# Patient Record
Sex: Female | Born: 1955 | Race: White | Hispanic: No | Marital: Married | Smoking: Current every day smoker
Health system: Southern US, Community
[De-identification: ages and names within clinical notes are randomized; demographics above are authoritative.]

## PROBLEM LIST (undated history)

## (undated) DIAGNOSIS — K3184 Gastroparesis: Secondary | ICD-10-CM

## (undated) DIAGNOSIS — I10 Essential (primary) hypertension: Secondary | ICD-10-CM

## (undated) DIAGNOSIS — M1990 Unspecified osteoarthritis, unspecified site: Secondary | ICD-10-CM

## (undated) DIAGNOSIS — K219 Gastro-esophageal reflux disease without esophagitis: Secondary | ICD-10-CM

## (undated) DIAGNOSIS — K589 Irritable bowel syndrome without diarrhea: Secondary | ICD-10-CM

## (undated) DIAGNOSIS — E119 Type 2 diabetes mellitus without complications: Secondary | ICD-10-CM

## (undated) DIAGNOSIS — F419 Anxiety disorder, unspecified: Secondary | ICD-10-CM

## (undated) HISTORY — DX: Gastro-esophageal reflux disease without esophagitis: K21.9

## (undated) HISTORY — PX: ABDOMINAL HYSTERECTOMY: SHX81

## (undated) HISTORY — PX: CHOLECYSTECTOMY: SHX55

## (undated) HISTORY — PX: UPPER GASTROINTESTINAL ENDOSCOPY: SHX188

## (undated) HISTORY — PX: COLONOSCOPY: SHX174

---

## 2004-07-14 ENCOUNTER — Ambulatory Visit: Payer: Self-pay | Admitting: Internal Medicine

## 2004-07-30 ENCOUNTER — Ambulatory Visit (HOSPITAL_COMMUNITY): Admission: RE | Admit: 2004-07-30 | Discharge: 2004-07-30 | Payer: Self-pay | Admitting: Internal Medicine

## 2004-07-30 ENCOUNTER — Ambulatory Visit: Payer: Self-pay | Admitting: Internal Medicine

## 2004-07-31 ENCOUNTER — Ambulatory Visit (HOSPITAL_COMMUNITY): Admission: RE | Admit: 2004-07-31 | Discharge: 2004-07-31 | Payer: Self-pay | Admitting: Internal Medicine

## 2004-09-09 ENCOUNTER — Ambulatory Visit: Payer: Self-pay | Admitting: Internal Medicine

## 2004-11-03 ENCOUNTER — Ambulatory Visit: Payer: Self-pay | Admitting: Internal Medicine

## 2004-11-05 ENCOUNTER — Encounter (HOSPITAL_COMMUNITY): Admission: RE | Admit: 2004-11-05 | Discharge: 2004-12-05 | Payer: Self-pay | Admitting: Internal Medicine

## 2004-11-12 ENCOUNTER — Ambulatory Visit (HOSPITAL_COMMUNITY): Admission: RE | Admit: 2004-11-12 | Discharge: 2004-11-12 | Payer: Self-pay | Admitting: General Surgery

## 2005-07-20 ENCOUNTER — Ambulatory Visit (HOSPITAL_COMMUNITY): Admission: RE | Admit: 2005-07-20 | Discharge: 2005-07-20 | Payer: Self-pay | Admitting: General Surgery

## 2005-08-13 ENCOUNTER — Ambulatory Visit: Payer: Self-pay | Admitting: Gastroenterology

## 2005-08-19 ENCOUNTER — Ambulatory Visit: Payer: Self-pay | Admitting: Gastroenterology

## 2005-08-19 ENCOUNTER — Ambulatory Visit (HOSPITAL_COMMUNITY): Admission: RE | Admit: 2005-08-19 | Discharge: 2005-08-19 | Payer: Self-pay | Admitting: Gastroenterology

## 2005-08-24 ENCOUNTER — Encounter (HOSPITAL_COMMUNITY): Admission: RE | Admit: 2005-08-24 | Discharge: 2005-09-23 | Payer: Self-pay | Admitting: Gastroenterology

## 2005-09-13 ENCOUNTER — Emergency Department (HOSPITAL_COMMUNITY): Admission: EM | Admit: 2005-09-13 | Discharge: 2005-09-13 | Payer: Self-pay | Admitting: Emergency Medicine

## 2005-09-14 ENCOUNTER — Ambulatory Visit: Payer: Self-pay | Admitting: Gastroenterology

## 2005-10-15 ENCOUNTER — Ambulatory Visit: Payer: Self-pay | Admitting: Internal Medicine

## 2006-04-28 ENCOUNTER — Ambulatory Visit: Payer: Self-pay | Admitting: Internal Medicine

## 2006-11-30 ENCOUNTER — Ambulatory Visit: Payer: Self-pay | Admitting: Cardiology

## 2010-02-23 ENCOUNTER — Encounter: Payer: Self-pay | Admitting: Unknown Physician Specialty

## 2011-10-27 ENCOUNTER — Emergency Department (HOSPITAL_COMMUNITY)
Admission: EM | Admit: 2011-10-27 | Discharge: 2011-10-27 | Disposition: A | Discharge status: Home / Self Care | Payer: BC Managed Care – PPO | Attending: Emergency Medicine | Admitting: Emergency Medicine

## 2011-10-27 ENCOUNTER — Emergency Department (HOSPITAL_COMMUNITY): Payer: BC Managed Care – PPO

## 2011-10-27 ENCOUNTER — Encounter (HOSPITAL_COMMUNITY): Payer: Self-pay

## 2011-10-27 DIAGNOSIS — Z90710 Acquired absence of both cervix and uterus: Secondary | ICD-10-CM | POA: Insufficient documentation

## 2011-10-27 DIAGNOSIS — K589 Irritable bowel syndrome without diarrhea: Secondary | ICD-10-CM

## 2011-10-27 DIAGNOSIS — R109 Unspecified abdominal pain: Secondary | ICD-10-CM

## 2011-10-27 DIAGNOSIS — Z9089 Acquired absence of other organs: Secondary | ICD-10-CM | POA: Insufficient documentation

## 2011-10-27 DIAGNOSIS — E876 Hypokalemia: Secondary | ICD-10-CM

## 2011-10-27 DIAGNOSIS — K219 Gastro-esophageal reflux disease without esophagitis: Secondary | ICD-10-CM

## 2011-10-27 HISTORY — DX: Irritable bowel syndrome, unspecified: K58.9

## 2011-10-27 HISTORY — DX: Anxiety disorder, unspecified: F41.9

## 2011-10-27 HISTORY — DX: Unspecified osteoarthritis, unspecified site: M19.90

## 2011-10-27 LAB — URINALYSIS, ROUTINE W REFLEX MICROSCOPIC
Bilirubin Urine: NEGATIVE
Glucose, UA: NEGATIVE mg/dL
Hgb urine dipstick: NEGATIVE
Ketones, ur: NEGATIVE mg/dL
Leukocytes, UA: NEGATIVE
Nitrite: NEGATIVE
Protein, ur: NEGATIVE mg/dL
Specific Gravity, Urine: 1.015 (ref 1.005–1.030)
Urobilinogen, UA: 0.2 mg/dL (ref 0.0–1.0)
pH: 5.5 (ref 5.0–8.0)

## 2011-10-27 LAB — COMPREHENSIVE METABOLIC PANEL
ALT: 22 U/L (ref 0–35)
AST: 17 U/L (ref 0–37)
Albumin: 3.9 g/dL (ref 3.5–5.2)
Alkaline Phosphatase: 97 U/L (ref 39–117)
BUN: 8 mg/dL (ref 6–23)
CO2: 25 mEq/L (ref 19–32)
Calcium: 10.4 mg/dL (ref 8.4–10.5)
Chloride: 101 mEq/L (ref 96–112)
Creatinine, Ser: 0.66 mg/dL (ref 0.50–1.10)
GFR calc Af Amer: >90 mL/min (ref >90)
GFR calc non Af Amer: >90 mL/min (ref >90)
Glucose, Bld: 99 mg/dL (ref 70–99)
Potassium: 3.3 mEq/L — ABNORMAL LOW (ref 3.5–5.1)
Sodium: 138 mEq/L (ref 135–145)
Total Bilirubin: 0.4 mg/dL (ref 0.3–1.2)
Total Protein: 7.4 g/dL (ref 6.0–8.3)

## 2011-10-27 LAB — CBC WITH DIFFERENTIAL/PLATELET
Basophils Absolute: 0 10*3/uL (ref 0.0–0.1)
Basophils Relative: 0 % (ref 0–1)
Eosinophils Absolute: 0.3 10*3/uL (ref 0.0–0.7)
Eosinophils Relative: 2 % (ref 0–5)
HCT: 40.9 % (ref 36.0–46.0)
Hemoglobin: 14.5 g/dL (ref 12.0–15.0)
Lymphocytes Relative: 37 % (ref 12–46)
Lymphs Abs: 4 10*3/uL (ref 0.7–4.0)
MCH: 33.4 pg (ref 26.0–34.0)
MCHC: 35.5 g/dL (ref 30.0–36.0)
MCV: 94.2 fL (ref 78.0–100.0)
Monocytes Absolute: 0.9 10*3/uL (ref 0.1–1.0)
Monocytes Relative: 8 % (ref 3–12)
Neutro Abs: 5.7 10*3/uL (ref 1.7–7.7)
Neutrophils Relative %: 53 % (ref 43–77)
Platelets: 250 10*3/uL (ref 150–400)
RBC: 4.34 MIL/uL (ref 3.87–5.11)
RDW: 12.4 % (ref 11.5–15.5)
WBC: 10.9 10*3/uL — ABNORMAL HIGH (ref 4.0–10.5)

## 2011-10-27 LAB — LIPASE, BLOOD: Lipase: 44 U/L (ref 11–59)

## 2011-10-27 MED ORDER — IOHEXOL 300 MG/ML  SOLN
100.0000 mL | Freq: Once | INTRAMUSCULAR | Status: AC | PRN
  Administered 2011-10-27: 100 mL via INTRAVENOUS

## 2011-10-27 MED ORDER — POTASSIUM CHLORIDE CRYS ER 20 MEQ PO TBCR
40.0000 meq | EXTENDED_RELEASE_TABLET | Freq: Once | ORAL | Status: AC
  Administered 2011-10-27: 40 meq via ORAL
  Filled 2011-10-27: qty 2

## 2011-10-27 MED ORDER — DICYCLOMINE HCL 20 MG PO TABS: 20.0000 mg | ORAL_TABLET | Freq: Four times a day (QID) | ORAL | Status: DC | PRN

## 2011-10-27 MED ORDER — POTASSIUM CHLORIDE CRYS ER 20 MEQ PO TBCR: 20.0000 meq | EXTENDED_RELEASE_TABLET | Freq: Two times a day (BID) | ORAL | Status: DC

## 2011-10-27 MED ORDER — ONDANSETRON HCL 4 MG/2ML IJ SOLN
4.0000 mg | Freq: Once | INTRAMUSCULAR | Status: AC
  Administered 2011-10-27: 4 mg via INTRAVENOUS
  Filled 2011-10-27: qty 2

## 2011-10-27 MED ORDER — ONDANSETRON HCL 4 MG/2ML IJ SOLN
4.0000 mg | Freq: Once | INTRAMUSCULAR | Status: AC
  Administered 2011-10-27: 4 mg via INTRAVENOUS

## 2011-10-27 MED ORDER — SODIUM CHLORIDE 0.9 % IV SOLN
1000.0000 mL | Freq: Once | INTRAVENOUS | Status: AC
  Administered 2011-10-27: 1000 mL via INTRAVENOUS

## 2011-10-27 MED ORDER — SODIUM CHLORIDE 0.9 % IV SOLN
1000.0000 mL | INTRAVENOUS | Status: DC
Start: 2011-10-27 — End: 2011-10-28
  Administered 2011-10-27: 1000 mL via INTRAVENOUS

## 2011-10-27 MED ORDER — DICYCLOMINE HCL 10 MG PO CAPS
10.0000 mg | ORAL_CAPSULE | Freq: Once | ORAL | Status: AC
  Administered 2011-10-27: 10 mg via ORAL
  Filled 2011-10-27: qty 1

## 2011-10-27 MED ORDER — HYDROCODONE-ACETAMINOPHEN 5-325 MG PO TABS: 2.0000 | ORAL_TABLET | Freq: Four times a day (QID) | ORAL | Status: DC | PRN

## 2011-10-27 MED ORDER — ONDANSETRON HCL 4 MG/2ML IJ SOLN
INTRAMUSCULAR | Status: AC
  Filled 2011-10-27: qty 2

## 2011-10-27 MED ORDER — MORPHINE SULFATE 4 MG/ML IJ SOLN
4.0000 mg | Freq: Once | INTRAMUSCULAR | Status: AC
  Administered 2011-10-27: 4 mg via INTRAVENOUS
  Filled 2011-10-27: qty 1

## 2011-10-27 NOTE — ED Notes (Signed)
Oral contrast complete, CT made aware.

## 2011-10-27 NOTE — ED Notes (Signed)
Pt c/o LUQ pain x 3 weeks.  Reports has been nauseated for 6 months or more.  Pt says symptoms have gotten worse in the past 3 weeks.  Reports has IBS, lbm was today.

## 2011-10-27 NOTE — ED Provider Notes (Signed)
History     CSN: [id number]  Arrival date & time 10/27/11  2952   First MD Initiated Contact with Patient 10/27/11 1843      Chief Complaint  Patient presents with  . Abdominal Pain    (Consider location/radiation/quality/duration/timing/severity/associated sxs/prior treatment) HPI  Patient reports she has had nausea for the past 3 weeks but got worse the past week. She states she was waking up about 3 AM in the morning with nausea however for the past week she has had nausea all day long. She states she feels weak and states she's had a normal appetite. She states she's also having left mid abdominal pain that is intermittent and can last several hours. She states today it has lasted about 5 hours. She states the pain is cramping and burning. She states nothing she does makes it feel worse and nothing she does makes it feel better. She denies fever but states she has felt clammy and had some chills. She denies dysuria but does have frequency. She drinks about 3 cans of Sprite a day. She states she typically has loose stools because of her feel so bowel syndrome, she denies any constipation.  PCP Dr Sherril Croon in [facility] GI Dr Karilyn Cota  Past Medical History  Diagnosis Date  . IBS (irritable bowel syndrome)   . Arthritis   . Anxiety     Past Surgical History  Procedure Date  . Abdominal hysterectomy   . Colonoscopy   . Cholecystectomy   . Upper gastrointestinal endoscopy   . Cesarean section     No family history on file.  History  Substance Use Topics  . Smoking status: Current Every Day Smoker 1 ppd  . Smokeless tobacco: Not on file  . Alcohol Use: No  unemployed  OB History    Grav Para Term Preterm Abortions TAB SAB Ect Mult Living                  Review of Systems  All other systems reviewed and are negative.    Allergies  Penicillins  Home Medications   Current Outpatient Rx  Name Route Sig Dispense Refill  . VITAMIN B COMPLEX PO Oral Take 1 tablet by mouth  daily.    Marland Kitchen DIAZEPAM 5 MG PO TABS Oral Take 5 mg by mouth 2 (two) times daily.    Marland Kitchen DICYCLOMINE HCL 10 MG PO CAPS Oral Take 10 mg by mouth 4 (four) times daily as needed. For IBS    . IBUPROFEN 800 MG PO TABS Oral Take 800 mg by mouth daily as needed.    Marland Kitchen PANTOPRAZOLE SODIUM 40 MG PO TBEC Oral Take 40 mg by mouth daily.    Marland Kitchen DICYCLOMINE HCL 20 MG PO TABS Oral Take 1 tablet (20 mg total) by mouth every 6 (six) hours as needed (abdominal pain). 30 tablet 0  . HYDROCODONE-ACETAMINOPHEN 5-325 MG PO TABS Oral Take 2 tablets by mouth every 6 (six) hours as needed for pain. 10 tablet 0  . POTASSIUM CHLORIDE CRYS ER 20 MEQ PO TBCR Oral Take 1 tablet (20 mEq total) by mouth 2 (two) times daily. 6 tablet 0    BP 136/66  Pulse 73  Temp 98.2 F (36.8 C) (Oral)  Resp 16  Ht 5\' 3"  (1.6 m)  Wt 173 lb (78.472 kg)  BMI 30.65 kg/m2  SpO2 98%  Vital signs normal    Physical Exam  Nursing note and vitals reviewed. Constitutional: She is oriented to person, place, and  time. She appears well-developed and well-nourished.  Non-toxic appearance. She does not appear ill. No distress.  HENT:  Head: Normocephalic and atraumatic.  Right Ear: External ear normal.  Left Ear: External ear normal.  Nose: Nose normal. No mucosal edema or rhinorrhea.  Mouth/Throat: Oropharynx is clear and moist and mucous membranes are normal. No dental abscesses or uvula swelling.  Eyes: Conjunctivae normal and EOM are normal. Pupils are equal, round, and reactive to light.  Neck: Normal range of motion and full passive range of motion without pain. Neck supple.  Cardiovascular: Normal rate, regular rhythm and normal heart sounds.  Exam reveals no gallop and no friction rub.   No murmur heard. Pulmonary/Chest: Effort normal and breath sounds normal. No respiratory distress. She has no wheezes. She has no rhonchi. She has no rales. She exhibits no tenderness and no crepitus.  Abdominal: Soft. Normal appearance and bowel sounds  are normal. She exhibits no distension. There is tenderness. There is no rigidity, no rebound, no guarding, no tenderness at McBurney's point and negative Murphy's sign.    Musculoskeletal: Normal range of motion. She exhibits no edema and no tenderness.       Moves all extremities well.   Neurological: She is alert and oriented to person, place, and time. She has normal strength. No cranial nerve deficit.  Skin: Skin is warm, dry and intact. No rash noted. No erythema. No pallor.  Psychiatric: She has a normal mood and affect. Her speech is normal and behavior is normal. Her mood appears not anxious.    ED Course  Procedures (including critical care time)   Medications  0.9 %  sodium chloride infusion (0 mL Intravenous Stopped 10/27/11 2114)    Followed by  0.9 %  sodium chloride infusion (1000 mL Intravenous New Bag/Given 10/27/11 1940)  potassium chloride SA (K-DUR,KLOR-CON) 20 MEQ tablet (not administered)  dicyclomine (BENTYL) 20 MG tablet (not administered)  HYDROcodone-acetaminophen (NORCO/VICODIN) 5-325 MG per tablet (not administered)  morphine 4 MG/ML injection 4 mg (4 mg Intravenous Given 10/27/11 1940)  ondansetron (ZOFRAN) injection 4 mg (4 mg Intravenous Given 10/27/11 1940)  iohexol (OMNIPAQUE) 300 MG/ML solution 100 mL (100 mL Intravenous Contrast Given 10/27/11 2131)   21:50 Pt given results of her CT scan, now states she is burping a lot. States she is on a stomach acid medication from her doctor in [place]. States she hasn't seen Dr Karilyn Cota in about 8 years. Also states she came here tonight because she wanted Dr Karilyn Cota to do endoscopy on her tonight in the ED. Advised that this was a problem that he would want her to make and appointment and see him in the office.     Results for orders placed during the hospital encounter of 10/27/11  URINALYSIS, ROUTINE W REFLEX MICROSCOPIC      Component Value Range   Color, Urine YELLOW  YELLOW   APPearance CLEAR  CLEAR   Specific  Gravity, Urine 1.015  1.005 - 1.030   pH 5.5  5.0 - 8.0   Glucose, UA NEGATIVE  NEGATIVE mg/dL   Hgb urine dipstick NEGATIVE  NEGATIVE   Bilirubin Urine NEGATIVE  NEGATIVE   Ketones, ur NEGATIVE  NEGATIVE mg/dL   Protein, ur NEGATIVE  NEGATIVE mg/dL   Urobilinogen, UA 0.2  0.0 - 1.0 mg/dL   Nitrite NEGATIVE  NEGATIVE   Leukocytes, UA NEGATIVE  NEGATIVE  CBC WITH DIFFERENTIAL      Component Value Range   WBC 10.9 (*) 4.0 -  10.5 K/uL   RBC 4.34  3.87 - 5.11 MIL/uL   Hemoglobin 14.5  12.0 - 15.0 g/dL   HCT 45.4  09.8 - 11.9 %   MCV 94.2  78.0 - 100.0 fL   MCH 33.4  26.0 - 34.0 pg   MCHC 35.5  30.0 - 36.0 g/dL   RDW 14.7  82.9 - 56.2 %   Platelets 250  150 - 400 K/uL   Neutrophils Relative 53  43 - 77 %   Neutro Abs 5.7  1.7 - 7.7 K/uL   Lymphocytes Relative 37  12 - 46 %   Lymphs Abs 4.0  0.7 - 4.0 K/uL   Monocytes Relative 8  3 - 12 %   Monocytes Absolute 0.9  0.1 - 1.0 K/uL   Eosinophils Relative 2  0 - 5 %   Eosinophils Absolute 0.3  0.0 - 0.7 K/uL   Basophils Relative 0  0 - 1 %   Basophils Absolute 0.0  0.0 - 0.1 K/uL  COMPREHENSIVE METABOLIC PANEL      Component Value Range   Sodium 138  135 - 145 mEq/L   Potassium 3.3 (*) 3.5 - 5.1 mEq/L   Chloride 101  96 - 112 mEq/L   CO2 25  19 - 32 mEq/L   Glucose, Bld 99  70 - 99 mg/dL   BUN 8  6 - 23 mg/dL   Creatinine, Ser 1.30  0.50 - 1.10 mg/dL   Calcium 86.5  8.4 - 78.4 mg/dL   Total Protein 7.4  6.0 - 8.3 g/dL   Albumin 3.9  3.5 - 5.2 g/dL   AST 17  0 - 37 U/L   ALT 22  0 - 35 U/L   Alkaline Phosphatase 97  39 - 117 U/L   Total Bilirubin 0.4  0.3 - 1.2 mg/dL   GFR calc non Af Amer >90  >90 mL/min   GFR calc Af Amer >90  >90 mL/min  LIPASE, BLOOD      Component Value Range   Lipase 44  11 - 59 U/L      Ct Abdomen Pelvis W Contrast  10/27/2011  *RADIOLOGY REPORT*  Clinical Data: Left side abdominal pain.  Nausea and vomiting. Question colitis.  Symptoms for 3 weeks.  Nausea for 6 months. Hysterectomy.   Cholecystectomy.  CT ABDOMEN AND PELVIS WITH CONTRAST  Technique:  Multidetector CT imaging of the abdomen and pelvis was performed following the standard protocol during bolus administration of intravenous contrast.  Contrast: OMNIPAQUE IOHEXOL 300 MG/ML  SOLN  Comparison: 05/08/2011  Findings: Mild centrilobular emphysema at the lung bases.  Mild volume loss in the lingula.  Normal heart size without pericardial or pleural effusion.  Mild hepatic steatosis, with a right hepatic lobe too small to characterize lesion.  Normal spleen, stomach, pancreas.  Cholecystectomy without biliary ductal dilatation.  Normal right adrenal gland.  Mild left adrenal thickening / nodularity which is similar back to 05/09/2004.  The most well-defined nodule measures 2.4 x 1.9 cm.  Interpolar left renal cyst measures 1.3 cm.  A left extrarenal pelvis.  Normal right kidney.  Advanced for age aortic atherosclerosis. No retroperitoneal or retrocrural adenopathy.  Nonspecific submucosal fat prominence involving the ascending colon and cecum.  Colon otherwise normal, without evidence of acute colitis. Normal terminal ileum and appendix.  Normal small bowel without abdominal ascites.    No pelvic adenopathy.    Normal urinary bladder.  Hysterectomy. No adnexal mass.  No significant  free fluid.  Likely degenerative sclerosis of the bilateral sacroiliac joints.  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  Likely due to median arcuate ligament impression.  IMPRESSION:  1. No acute process in the abdomen or pelvis. 2.  Probable mild hepatic steatosis. 3.  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   Original Report Authenticated By: Consuello Bossier, M.D.      1. GERD (gastroesophageal reflux disease)   2. Irritable bowel syndrome   3. Abdominal pain   4. Hypokalemia    New Prescriptions   DICYCLOMINE (BENTYL) 20 MG TABLET    Take  1 tablet (20 mg total) by mouth every 6 (six) hours as needed (abdominal pain).   HYDROCODONE-ACETAMINOPHEN (NORCO/VICODIN) 5-325 MG PER TABLET    Take 2 tablets by mouth every 6 (six) hours as needed for pain.   POTASSIUM CHLORIDE SA (K-DUR,KLOR-CON) 20 MEQ TABLET    Take 1 tablet (20 mEq total) by mouth 2 (two) times daily.    Plan discharge  Devoria Albe, MD, FACEP    MDM          Ward Givens, MD 10/27/11 2206

## 2011-11-09 ENCOUNTER — Encounter (INDEPENDENT_AMBULATORY_CARE_PROVIDER_SITE_OTHER): Payer: Self-pay | Admitting: *Deleted

## 2011-11-11 ENCOUNTER — Encounter (INDEPENDENT_AMBULATORY_CARE_PROVIDER_SITE_OTHER): Payer: Self-pay | Admitting: Internal Medicine

## 2011-11-11 ENCOUNTER — Ambulatory Visit (INDEPENDENT_AMBULATORY_CARE_PROVIDER_SITE_OTHER): Payer: BC Managed Care – PPO | Admitting: Internal Medicine

## 2011-11-11 ENCOUNTER — Encounter (INDEPENDENT_AMBULATORY_CARE_PROVIDER_SITE_OTHER): Payer: Self-pay | Admitting: *Deleted

## 2011-11-11 ENCOUNTER — Other Ambulatory Visit (INDEPENDENT_AMBULATORY_CARE_PROVIDER_SITE_OTHER): Payer: Self-pay | Admitting: *Deleted

## 2011-11-11 ENCOUNTER — Encounter (HOSPITAL_COMMUNITY): Payer: Self-pay | Admitting: Pharmacy Technician

## 2011-11-11 VITALS — BP 118/68 | HR 64 | Temp 98.1°F | Ht 63.0 in | Wt 164.9 lb

## 2011-11-11 DIAGNOSIS — K219 Gastro-esophageal reflux disease without esophagitis: Secondary | ICD-10-CM

## 2011-11-11 DIAGNOSIS — R1310 Dysphagia, unspecified: Secondary | ICD-10-CM

## 2011-11-11 MED ORDER — SODIUM CHLORIDE 0.45 % IV SOLN
INTRAVENOUS | Status: DC
  Administered 2011-11-12: 10:00:00 via INTRAVENOUS

## 2011-11-11 NOTE — Patient Instructions (Addendum)
EGD/ED. Soft foods. No meats or breads. Take Protonix 30 minutes before breakfast.

## 2011-11-11 NOTE — Progress Notes (Addendum)
Subjective:     Patient ID: Haley Graham, female   DOB: 07-Haley-1957, 56 y.o.   MRN: [id number]  HPI Referred to office by Dr. Sherril Croon. She tells me it feels foods are lodging in her esohpagus. She says when this occurs she will have nausea and a lot of mucous.  She has nausea every morning. She has had symptoms for 5 weeks. She has been on a bland diet x 5 weeks.  Seen in the ED at [facility] for nausea. She says it feels like her esophgus is very tight. Occuring on a daily basis.   Appetite is not good. Weight loss of 15 pounds in the past 5 weeks.  Some epigastric burning occasionally. She has a BM about 2 x a day.    CT abdomen/pelvis with CM 10/27/2011 MPRESSION:  1. No acute process in the abdomen or pelvis.  2. Probable mild hepatic steatosis.  3. 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     Review of SystemsI See hpi Current Outpatient Prescriptions  Medication Sig Dispense Refill  . ALPRAZolam (XANAX) 0.5 MG tablet Take 0.5 mg by mouth 2 (two) times daily.      Marland Kitchen dicyclomine (BENTYL) 10 MG capsule Take 10 mg by mouth 4 (four) times daily as needed. For IBS      . pantoprazole (PROTONIX) 40 MG tablet Take 40 mg by mouth daily.      . B Complex Vitamins (VITAMIN B COMPLEX PO) Take 1 tablet by mouth daily.      Marland Kitchen DISCONTD: dicyclomine (BENTYL) 20 MG tablet Take 1 tablet (20 mg total) by mouth every 6 (six) hours as needed (abdominal pain).  30 tablet  0   Past Medical History  Diagnosis Date  . IBS (irritable bowel syndrome)   . Arthritis   . Anxiety   . GERD (gastroesophageal reflux disease)    Past Surgical History  Procedure Date  . Abdominal hysterectomy   . Colonoscopy   . Cholecystectomy   . Upper gastrointestinal endoscopy   . Cesarean section    History   Social History  . Marital Status: Married    Spouse Name: N/A    Number of Children: N/A  . Years of Education: N/A   Occupational History  . Not on  file.   Social History Main Topics  . Smoking status: Current Every Day Smoker  . Smokeless tobacco: Not on file   Comment: 1 pack a day  since age 9  . Alcohol Use: No  . Drug Use: No  . Sexually Active: Not on file   Other Topics Concern  . Not on file   Social History Narrative  . No narrative on file   Family Status  Relation Status Death Age  . Mother Alive     good health  . Father Alive     good health  . Brother Deceased     MVC   Allergies  Allergen Reactions  . Penicillins Rash        Objective:   Physical Exam Filed Vitals:   11/11/11 1141  BP: 118/68  Pulse: 64  Temp: 98.1 F (36.7 C)  Height: 5\' 3"  (1.6 m)  Weight: 164 lb 14.4 oz (74.798 kg)   Alert and oriented. Skin warm and dry. Oral mucosa is moist.   . Sclera anicteric, conjunctivae is pink. Thyroid not enlarged. No cervical lymphadenopathy. Lungs clear. Heart regular rate and rhythm.  Abdomen  is soft. Bowel sounds are positive. No hepatomegaly. No abdominal masses felt. No tenderness.  No edema to lower extremities.       Assessment:    Dysphagia to solids. History of same in past.       Plan:       EGD/ED with Dr Karilyn Cota

## 2011-11-12 ENCOUNTER — Encounter (HOSPITAL_COMMUNITY): Payer: Self-pay | Admitting: *Deleted

## 2011-11-12 ENCOUNTER — Encounter (HOSPITAL_COMMUNITY)
Admission: RE | Disposition: A | Discharge status: Home / Self Care | Payer: Self-pay | Source: Ambulatory Visit | Attending: Internal Medicine

## 2011-11-12 ENCOUNTER — Ambulatory Visit (HOSPITAL_COMMUNITY)
Admission: RE | Admit: 2011-11-12 | Discharge: 2011-11-12 | Disposition: A | Discharge status: Home / Self Care | Payer: BC Managed Care – PPO | Source: Ambulatory Visit | Attending: Internal Medicine | Admitting: Internal Medicine

## 2011-11-12 DIAGNOSIS — R1310 Dysphagia, unspecified: Secondary | ICD-10-CM

## 2011-11-12 DIAGNOSIS — R634 Abnormal weight loss: Secondary | ICD-10-CM | POA: Insufficient documentation

## 2011-11-12 DIAGNOSIS — K449 Diaphragmatic hernia without obstruction or gangrene: Secondary | ICD-10-CM | POA: Insufficient documentation

## 2011-11-12 DIAGNOSIS — R110 Nausea: Secondary | ICD-10-CM | POA: Insufficient documentation

## 2011-11-12 SURGERY — ESOPHAGOGASTRODUODENOSCOPY (EGD) WITH ESOPHAGEAL DILATION
Anesthesia: Moderate Sedation

## 2011-11-12 MED ORDER — MEPERIDINE HCL 25 MG/ML IJ SOLN
INTRAMUSCULAR | Status: DC | PRN
  Administered 2011-11-12 (×2): 25 mg via INTRAVENOUS

## 2011-11-12 MED ORDER — ONDANSETRON HCL 4 MG PO TABS: 4.0000 mg | ORAL_TABLET | Freq: Two times a day (BID) | ORAL | Status: DC | PRN

## 2011-11-12 MED ORDER — MIDAZOLAM HCL 2 MG/2ML IJ SOLN
INTRAMUSCULAR | Status: AC
  Filled 2011-11-12: qty 4

## 2011-11-12 MED ORDER — PANTOPRAZOLE SODIUM 40 MG PO TBEC: 40.0000 mg | DELAYED_RELEASE_TABLET | Freq: Two times a day (BID) | ORAL | Status: DC

## 2011-11-12 MED ORDER — MIDAZOLAM HCL 5 MG/5ML IJ SOLN
INTRAMUSCULAR | Status: DC | PRN
  Administered 2011-11-12 (×3): 2 mg via INTRAVENOUS
  Administered 2011-11-12 (×2): 3 mg via INTRAVENOUS
  Administered 2011-11-12 (×2): 2 mg via INTRAVENOUS

## 2011-11-12 MED ORDER — MIDAZOLAM HCL 5 MG/5ML IJ SOLN
INTRAMUSCULAR | Status: AC
  Filled 2011-11-12: qty 10

## 2011-11-12 MED ORDER — MEPERIDINE HCL 50 MG/ML IJ SOLN
INTRAMUSCULAR | Status: AC
  Filled 2011-11-12: qty 1

## 2011-11-12 MED ORDER — STERILE WATER FOR IRRIGATION IR SOLN
Status: DC | PRN
  Administered 2011-11-12: 11:00:00

## 2011-11-12 MED ORDER — BUTAMBEN-TETRACAINE-BENZOCAINE 2-2-14 % EX AERO
INHALATION_SPRAY | CUTANEOUS | Status: DC | PRN
  Administered 2011-11-12: 2 via TOPICAL

## 2011-11-12 NOTE — Op Note (Addendum)
EGD PROCEDURE REPORT  PATIENT:  Haley Graham  MR#:  [id number] Birthdate:  November 19, 1955, 56 y.o., female Endoscopist:  Dr. Malissa Hippo, MD Referred By:  Dr. Ester Rink, NP Procedure Date: 11/12/2011  Procedure:   EGD with ED.  Indications:  Patient is 56 year old Caucasian female with chronic GERD who presents with recurrent nausea and several month history of solid food dysphagia. She states she has been on a liquid diet for 5 weeks and has lost 15 pounds as she is unable to swallow solids. Her heartburn is well controlled with medication.            Informed Consent:  The risks, benefits, alternatives & imponderables which include, but are not limited to, bleeding, infection, perforation, drug reaction and potential missed lesion have been reviewed.  The potential for biopsy, lesion removal, esophageal dilation, etc. have also been discussed.  Questions have been answered.  All parties agreeable.  Please see history & physical in medical record for more information.  Medications:  Demerol 50 mg IV Versed 16mg  IV Cetacaine spray topically for oropharyngeal anesthesia  Description of procedure:  The endoscope was introduced through the mouth and advanced to the second portion of the duodenum without difficulty or limitations. The mucosal surfaces were surveyed very carefully during advancement of the scope and upon withdrawal.  Findings:  Esophagus:  Mucosa of the esophagus was normal throughout. Unremarkable GE junction without ring or stricture formation. GEJ:  36 cm Hiatus:  38 cm Stomach:  Stomach was empty and distended very well with insufflation. Folds in the proximal stomach were normal. Examination mucosa at body, antrum, pyloric channel, angularis, fundus and cardia was normal. Duodenum:  Normal bulbar and post bulbar mucosa.  Therapeutic/Diagnostic Maneuvers Performed:  Esophagus dilated by passing 56 French Maloney dilator to full insertion. Esophageal mucosa was  reexamined post dilation and no disruption noted.  Complications:  None  Impression: Small sliding hiatal hernia otherwise normal esophagogastroduodenoscopy. Esophagus dilated by passing 56 French Maloney dilator but no mucosal disruption noted.  Recommendations:  Continue anti-reflux measures. Pantoprazole 40 mg by mouth twice a day. Zofran 4 mg by mouth twice a day when necessary nausea. Patient to call office with progress report in one week. If she remains her dysphagia will proceed with esophageal manometry.  Brenyn Petrey U  11/12/2011  11:07 AM  CC: Dr. Orvilla Cornwall C & Dr. No ref. provider found

## 2011-11-12 NOTE — H&P (Signed)
Haley Graham is an 56 y.o. female.   Chief Complaint: Patient is therefore EGD and ED. HPI: Patient is 56 year old Caucasian female presents six-month history of intermittent nausea and one year history of intermittent solid food dysphagia but has gotten worse over the last weeks. She stayed on liquids. She has lost 15 pounds. She is on is able to pass food bolus down. She points to lower sternal area at site of bolus obstruction. She says her heartburn is better controlled with double dose PPI. She denies melena or rectal bleeding. She also complains of intermittent epigastric pain. She does not take NSAIDs or drink alcohol but she still smoking cigarettes. Her last EGD was possibly 6 or 7 years ago. Also had abnormal gastric emptying study in 2006.  Past Medical History  Diagnosis Date  . IBS (irritable bowel syndrome)   . Arthritis   . Anxiety   . GERD (gastroesophageal reflux disease)     Past Surgical History  Procedure Date  . Abdominal hysterectomy   . Colonoscopy   . Cholecystectomy   . Upper gastrointestinal endoscopy   . Cesarean section     History reviewed. No pertinent family history. Social History:  reports that she has been smoking Cigarettes.  She has a 17.25 pack-year smoking history. She does not have any smokeless tobacco history on file. She reports that she does not drink alcohol or use illicit drugs.  Allergies:  Allergies  Allergen Reactions  . Codeine Nausea And Vomiting  . Penicillins Rash    Medications Prior to Admission  Medication Sig Dispense Refill  . ALPRAZolam (XANAX) 0.5 MG tablet Take 0.5 mg by mouth 2 (two) times daily.      Marland Kitchen dicyclomine (BENTYL) 10 MG capsule Take 10 mg by mouth 4 (four) times daily as needed. For IBS      . lactobacillus acidophilus (BACID) TABS Take 1 tablet by mouth 3 (three) times daily.      . pantoprazole (PROTONIX) 40 MG tablet Take 40 mg by mouth daily.        No results found for this or any previous visit  (from the past 48 hour(s)). No results found.  ROS  Blood pressure 132/61, pulse 68, temperature 97.8 F (36.6 C), temperature source Oral, resp. rate 17, height 5\' 3"  (1.6 m), weight 164 lb (74.39 kg), SpO2 97.00%. Physical Exam  Constitutional: She appears well-developed and well-nourished.  HENT:  Mouth/Throat: Oropharynx is clear and moist.  Eyes: Conjunctivae normal are normal. No scleral icterus.  Neck: No thyromegaly present.  Cardiovascular: Normal rate, regular rhythm and normal heart sounds.   No murmur heard. Respiratory: Effort normal and breath sounds normal.  GI: Soft. She exhibits no distension and no mass. There is Tenderness: mild midepigastric tenderness..  Lymphadenopathy:    She has no cervical adenopathy.  Neurological: She is alert.  Skin: Skin is warm and dry.     Assessment/Plan Solid food dysphagia in a patient with chronic GERD. Recurrent nausea. EGD possible ED.  Zorawar Strollo U 11/12/2011, 10:33 AM

## 2011-11-18 ENCOUNTER — Telehealth (INDEPENDENT_AMBULATORY_CARE_PROVIDER_SITE_OTHER): Payer: Self-pay | Admitting: *Deleted

## 2011-11-18 NOTE — Telephone Encounter (Signed)
Haley Graham (LM) would like for Haley Ar, NP to give her a call. She is as sick as she was before her procedure. The return phone number is [phone].

## 2011-11-18 NOTE — Telephone Encounter (Signed)
Continues to have nausea. Has OV in morning.

## 2011-11-19 ENCOUNTER — Encounter (INDEPENDENT_AMBULATORY_CARE_PROVIDER_SITE_OTHER): Payer: Self-pay | Admitting: Internal Medicine

## 2011-11-19 ENCOUNTER — Ambulatory Visit (INDEPENDENT_AMBULATORY_CARE_PROVIDER_SITE_OTHER): Payer: BC Managed Care – PPO | Admitting: Internal Medicine

## 2011-11-19 VITALS — BP 112/70 | HR 66 | Temp 98.2°F | Ht 63.0 in | Wt 161.5 lb

## 2011-11-19 DIAGNOSIS — R110 Nausea: Secondary | ICD-10-CM

## 2011-11-19 DIAGNOSIS — K3184 Gastroparesis: Secondary | ICD-10-CM

## 2011-11-19 NOTE — Patient Instructions (Addendum)
Gastric emptying study. HOB elevated. No more Reglan

## 2011-11-19 NOTE — Progress Notes (Signed)
Subjective:     Patient ID: Haley Graham, female   DOB: October 05, 1955, 56 y.o.   MRN: [id number]  HPI Presents today for f/u after undergoing and EGD.\ED Today she states she is nauseated, but not as bad as it was.  Nausea usually at night and in the morning. She tells me she was diagnosed with gastroparesis years ago. She was Reglan but stopped this. She has also been under a lot stress. She spoke with Dr. Sherryll Burger this weekend and was given an Rx for Reglan 5mg  bid.  She says her she became shaky and her heart felt funny. Appetite is good.  No abdominal pain. She has been constipated and actually had to pick stool out Monday.  05/2005 Gastric empyting study:Findings: After 120 minutes, approximately 43% of tracer remains in the stomach. This is greater than normal range (normal range less than 30%).  IMPRESSION:  Evidence of delayed gastric emptying/gastroparesis.   EGD 11/12/2011:  Nausea and dysphagia:Impression:  Small sliding hiatal hernia otherwise normal esophagogastroduodenoscopy.  Esophagus dilated by passing 56 French Maloney dilator but no mucosal disruption noted.    Review of Systems  See hpi Current Outpatient Prescriptions  Medication Sig Dispense Refill  . ALPRAZolam (XANAX) 0.5 MG tablet Take 0.5 mg by mouth 2 (two) times daily.      Marland Kitchen dicyclomine (BENTYL) 10 MG capsule Take 10 mg by mouth as needed.       . lactobacillus acidophilus (BACID) TABS Take 1 tablet by mouth 3 (three) times daily.      . ondansetron (ZOFRAN) 4 MG tablet Take 1 tablet (4 mg total) by mouth every 12 (twelve) hours as needed for nausea.  30 tablet  1  . pantoprazole (PROTONIX) 40 MG tablet Take 1 tablet (40 mg total) by mouth 2 (two) times daily before a meal.  60 tablet  5        Past Surgical History  Procedure Date  . Abdominal hysterectomy   . Colonoscopy   . Cholecystectomy   . Upper gastrointestinal endoscopy   . Cesarean section    History   Social History  . Marital Status: Married     Spouse Name: N/A    Number of Children: N/A  . Years of Education: N/A   Occupational History  . Not on file.   Social History Main Topics  . Smoking status: Current Every Day Smoker -- 0.7 packs/day for 23 years    Types: Cigarettes  . Smokeless tobacco: Not on file   Comment: 1 pack a day  since age 66  . Alcohol Use: No  . Drug Use: No  . Sexually Active: Not on file   Other Topics Concern  . Not on file   Social History Narrative  . No narrative on file   Family Status  Relation Status Death Age  . Mother Alive     good health  . Father Alive     good health  . Brother Deceased     MVC   Allergies  Allergen Reactions  . Codeine Nausea And Vomiting  . Penicillins Rash       Objective:   Physical Exam Filed Vitals:   11/19/11 0931  BP: 112/70  Pulse: 66  Temp: 98.2 F (36.8 C)  Height: 5\' 3"  (1.6 m)  Weight: 161 lb 8 oz (73.256 kg)   Alert and oriented. Skin warm and dry. Oral mucosa is moist.   . Sclera anicteric, conjunctivae is pink. Thyroid not enlarged. No  cervical lymphadenopathy. Lungs clear. Heart regular rate and rhythm.  Abdomen is soft. Bowel sounds are positive. No hepatomegaly. No abdominal masses felt. No tenderness.  No edema to lower extremities. Patient is alert and oriented.      Assessment:    Nausea, better today. Hx of gastroparesis.  Unable to take Reglan. Made heart feel funny and she became shaky.    Plan:    Gastric emptying study.  Small frequent meals. No red meats. Further recommendations to follow. HOB elevated. Try to reduce stress.

## 2011-11-23 ENCOUNTER — Encounter (HOSPITAL_COMMUNITY): Admission: RE | Admit: 2011-11-23 | Payer: BC Managed Care – PPO | Source: Ambulatory Visit

## 2011-11-30 ENCOUNTER — Encounter (HOSPITAL_COMMUNITY): Payer: Self-pay

## 2011-11-30 ENCOUNTER — Emergency Department (HOSPITAL_COMMUNITY)
Admission: EM | Admit: 2011-11-30 | Discharge: 2011-11-30 | Disposition: A | Discharge status: Home / Self Care | Payer: BC Managed Care – PPO | Attending: Emergency Medicine | Admitting: Emergency Medicine

## 2011-11-30 DIAGNOSIS — F17200 Nicotine dependence, unspecified, uncomplicated: Secondary | ICD-10-CM | POA: Insufficient documentation

## 2011-11-30 DIAGNOSIS — Z79899 Other long term (current) drug therapy: Secondary | ICD-10-CM | POA: Insufficient documentation

## 2011-11-30 DIAGNOSIS — E46 Unspecified protein-calorie malnutrition: Secondary | ICD-10-CM

## 2011-11-30 DIAGNOSIS — F411 Generalized anxiety disorder: Secondary | ICD-10-CM | POA: Insufficient documentation

## 2011-11-30 DIAGNOSIS — K3184 Gastroparesis: Secondary | ICD-10-CM

## 2011-11-30 DIAGNOSIS — K589 Irritable bowel syndrome without diarrhea: Secondary | ICD-10-CM

## 2011-11-30 DIAGNOSIS — K219 Gastro-esophageal reflux disease without esophagitis: Secondary | ICD-10-CM | POA: Insufficient documentation

## 2011-11-30 DIAGNOSIS — M129 Arthropathy, unspecified: Secondary | ICD-10-CM | POA: Insufficient documentation

## 2011-11-30 HISTORY — DX: Gastroparesis: K31.84

## 2011-11-30 MED ORDER — PROMETHAZINE HCL 25 MG PO TABS: 25.0000 mg | ORAL_TABLET | Freq: Four times a day (QID) | ORAL | Status: DC | PRN

## 2011-11-30 MED ORDER — ERYTHROMYCIN BASE 250 MG PO TABS: 250.0000 mg | ORAL_TABLET | Freq: Three times a day (TID) | ORAL | Status: DC

## 2011-11-30 MED ORDER — ERYTHROMYCIN BASE 250 MG PO TABS
250.0000 mg | ORAL_TABLET | Freq: Three times a day (TID) | ORAL | Status: DC
  Administered 2011-11-30: 250 mg via ORAL
  Filled 2011-11-30 (×4): qty 1

## 2011-11-30 MED ORDER — PROMETHAZINE HCL 25 MG PO TABS
25.0000 mg | ORAL_TABLET | Freq: Once | ORAL | Status: AC
  Administered 2011-11-30: 25 mg via ORAL
  Filled 2011-11-30: qty 1

## 2011-11-30 MED ORDER — SODIUM CHLORIDE 0.9 % IV BOLUS (SEPSIS)
1000.0000 mL | Freq: Once | INTRAVENOUS | Status: AC
  Administered 2011-11-30: 1000 mL via INTRAVENOUS

## 2011-11-30 NOTE — ED Provider Notes (Signed)
I saw and evaluated the patient, reviewed the resident's note and I agree with the findings and plan.   .Face to face Exam:  General:  Awake HEENT:  Atraumatic Resp:  Normal effort Abd:  Nondistended Neuro:No focal weakness Lymph: No adenopathy   Nelia Shi, MD 11/30/11 1232

## 2011-11-30 NOTE — ED Provider Notes (Signed)
History     CSN: [id number]  Arrival date & time 11/30/11  8657   First MD Initiated Contact with Patient 11/30/11 1023      Chief Complaint  Patient presents with  . Nausea    (Consider location/radiation/quality/duration/timing/severity/associated sxs/prior treatment) HPI CC: Nausea, weak, cold sweats  Pt is a 56yo F who comes in today wanting a second opinion regarding her GI symptoms. She is complaining of a ~46mo h/o recurrent episodes of nausea, feeling weak, and occasionally associated w/ cold sweats. Symptom currently occur about 3 x daily. Pt is frustrated that symptoms have persisted despite use of Zofran. Pt has been dx previously w/ gastroparesis and IBS and is followed actively for these by a GI specialist and her PCP. Pts symptoms originally were well controlled w/ Reglan, but pt eventually developed palpitations and chest pain w/ the medication. Pt now unable to tolerate even 5mg  of Reglan. Pt states that episodes typically last ~1hr w/ mild relief w/ zofran. Pt has been eating a bland diet for several months w/o much benefit. Decreased PO over the past couple of days. Pt denies fever, rash, CP, palpitations, HA, diarrhea, hematochezia, hematemesis, dysuria.    Past Medical History  Diagnosis Date  . IBS (irritable bowel syndrome)   . Arthritis   . Anxiety   . GERD (gastroesophageal reflux disease)   . Gastroparesis     Past Surgical History  Procedure Date  . Abdominal hysterectomy   . Colonoscopy   . Cholecystectomy   . Upper gastrointestinal endoscopy   . Cesarean section     No family history on file.  History  Substance Use Topics  . Smoking status: Current Every Day Smoker -- 0.7 packs/day for 23 years    Types: Cigarettes  . Smokeless tobacco: Not on file   Comment: 1 pack a day  since age 89  . Alcohol Use: No    OB History    Grav Para Term Preterm Abortions TAB SAB Ect Mult Living                  Review of Systems  All other systems  reviewed and are negative.    Allergies  Codeine; Reglan; and Penicillins  Home Medications   Current Outpatient Rx  Name Route Sig Dispense Refill  . ALPRAZOLAM 0.5 MG PO TABS Oral Take 0.5 mg by mouth 2 (two) times daily.    Marland Kitchen BACID PO TABS Oral Take 1 tablet by mouth daily.     Marland Kitchen ONDANSETRON HCL 4 MG PO TABS Oral Take 4 mg by mouth every 12 (twelve) hours as needed. nausea    . PANTOPRAZOLE SODIUM 40 MG PO TBEC Oral Take 40 mg by mouth 2 (two) times daily before a meal.    . SENNA PO Oral Take 2 tablets by mouth daily.      BP 120/56  Pulse 67  Temp 98 F (36.7 C) (Oral)  Resp 16  SpO2 99%  Physical Exam  Nursing note and vitals reviewed. Constitutional: She is oriented to person, place, and time. She appears well-developed and well-nourished. No distress.  HENT:  Head: Normocephalic and atraumatic.  Eyes: Pupils are equal, round, and reactive to light.  Neck: Normal range of motion.  Cardiovascular: Normal rate and intact distal pulses.   Pulmonary/Chest: No respiratory distress.  Abdominal: Normal appearance. She exhibits no distension.       NABS Mild diffuse tenderness on palpation.  No masses/stool   Musculoskeletal: Normal range of  motion.  Neurological: She is alert and oriented to person, place, and time. No cranial nerve deficit.  Skin: Skin is warm and dry. No rash noted.  Psychiatric: She has a normal mood and affect. Her behavior is normal.    ED Course  Procedures (including critical care time)  Labs Reviewed - No data to display No results found.   1. Gastroparesis   2. IBS (irritable bowel syndrome)       MDM  56yo F w/ gastroporesis, IBS, and anxiety w/ recurrent flares of IBS and gastroporesis. Reglan no longer an option. Past imaging and GI studies reviewed and discussed w/ pt. Feelings of weakness are also likely from her narrow diet and not eating enough calories overall - Erythromycin 250mg   - Phenergan.  - IVF NS 1L - Will  provide contact info for other GI doctors locally for second opinion   Update: Pt symptoms improved w/ Phenergan and 1L NS bolus.  - Pt to keep current GI doctor and PCP - Rx for erythromycin and Phenergan provided - pt to keep close journal of foods that exacerbate symptoms and to start broadening diet as tolerated (add ensure) - Pt to f/u w/ PCP   Shelly Flatten, MD Family Medicine PGY-2 11/30/2011, 12:30 PM    Ozella Rocks, MD 11/30/11 1228  Ozella Rocks, MD 11/30/11 1231

## 2011-11-30 NOTE — ED Notes (Signed)
Pt with 2 months of nausea, weakness and not feeling very well, has had numerous tests without any relief

## 2011-12-17 ENCOUNTER — Telehealth (INDEPENDENT_AMBULATORY_CARE_PROVIDER_SITE_OTHER): Payer: Self-pay | Admitting: Internal Medicine

## 2011-12-17 DIAGNOSIS — K219 Gastro-esophageal reflux disease without esophagitis: Secondary | ICD-10-CM

## 2011-12-17 MED ORDER — SUCRALFATE 1 GM/10ML PO SUSP: 1.0000 g | Freq: Four times a day (QID) | ORAL | Status: DC

## 2011-12-17 NOTE — Telephone Encounter (Signed)
C/o burning sensation in her epigastric region. Will call in a Carafate Qid. Call me tomorrow and let me know how you are doing. I adivsed he to go to the ED if she thinks she is dehydrated.

## 2011-12-19 ENCOUNTER — Encounter (HOSPITAL_COMMUNITY): Payer: Self-pay | Admitting: Emergency Medicine

## 2011-12-19 ENCOUNTER — Emergency Department (HOSPITAL_COMMUNITY)
Admission: EM | Admit: 2011-12-19 | Discharge: 2011-12-19 | Disposition: A | Discharge status: Home / Self Care | Payer: BC Managed Care – PPO | Attending: Emergency Medicine | Admitting: Emergency Medicine

## 2011-12-19 DIAGNOSIS — R6883 Chills (without fever): Secondary | ICD-10-CM | POA: Insufficient documentation

## 2011-12-19 DIAGNOSIS — K219 Gastro-esophageal reflux disease without esophagitis: Secondary | ICD-10-CM | POA: Insufficient documentation

## 2011-12-19 DIAGNOSIS — N390 Urinary tract infection, site not specified: Secondary | ICD-10-CM | POA: Insufficient documentation

## 2011-12-19 DIAGNOSIS — M129 Arthropathy, unspecified: Secondary | ICD-10-CM | POA: Insufficient documentation

## 2011-12-19 DIAGNOSIS — N949 Unspecified condition associated with female genital organs and menstrual cycle: Secondary | ICD-10-CM | POA: Insufficient documentation

## 2011-12-19 DIAGNOSIS — K3184 Gastroparesis: Secondary | ICD-10-CM | POA: Insufficient documentation

## 2011-12-19 DIAGNOSIS — F411 Generalized anxiety disorder: Secondary | ICD-10-CM | POA: Insufficient documentation

## 2011-12-19 DIAGNOSIS — F17200 Nicotine dependence, unspecified, uncomplicated: Secondary | ICD-10-CM | POA: Insufficient documentation

## 2011-12-19 DIAGNOSIS — R110 Nausea: Secondary | ICD-10-CM | POA: Insufficient documentation

## 2011-12-19 DIAGNOSIS — Z79899 Other long term (current) drug therapy: Secondary | ICD-10-CM | POA: Insufficient documentation

## 2011-12-19 DIAGNOSIS — R102 Pelvic and perineal pain: Secondary | ICD-10-CM

## 2011-12-19 DIAGNOSIS — R109 Unspecified abdominal pain: Secondary | ICD-10-CM | POA: Insufficient documentation

## 2011-12-19 LAB — CBC WITH DIFFERENTIAL/PLATELET
Eosinophils Absolute: 0.1 10*3/uL (ref 0.0–0.7)
Lymphocytes Relative: 31 % (ref 12–46)
Lymphs Abs: 2.4 10*3/uL (ref 0.7–4.0)
Neutro Abs: 4.6 10*3/uL (ref 1.7–7.7)
Neutrophils Relative %: 61 % (ref 43–77)
Platelets: 218 10*3/uL (ref 150–400)
RBC: 4.36 MIL/uL (ref 3.87–5.11)
WBC: 7.7 10*3/uL (ref 4.0–10.5)

## 2011-12-19 LAB — BASIC METABOLIC PANEL
CO2: 26 mEq/L (ref 19–32)
Chloride: 101 mEq/L (ref 96–112)
GFR calc non Af Amer: >90 mL/min (ref >90)
Glucose, Bld: 106 mg/dL — ABNORMAL HIGH (ref 70–99)
Potassium: 4 mEq/L (ref 3.5–5.1)
Sodium: 136 mEq/L (ref 135–145)

## 2011-12-19 LAB — URINALYSIS, ROUTINE W REFLEX MICROSCOPIC
Nitrite: NEGATIVE
Specific Gravity, Urine: 1.01 (ref 1.005–1.030)
Urobilinogen, UA: 0.2 mg/dL (ref 0.0–1.0)
pH: 6.5 (ref 5.0–8.0)

## 2011-12-19 LAB — WET PREP, GENITAL

## 2011-12-19 LAB — URINE MICROSCOPIC-ADD ON

## 2011-12-19 MED ORDER — NITROFURANTOIN MONOHYD MACRO 100 MG PO CAPS: 100.0000 mg | ORAL_CAPSULE | Freq: Two times a day (BID) | ORAL | Status: DC

## 2011-12-19 MED ORDER — PHENAZOPYRIDINE HCL 200 MG PO TABS: 200.0000 mg | ORAL_TABLET | Freq: Three times a day (TID) | ORAL | Status: DC

## 2011-12-19 MED ORDER — TRAMADOL HCL 50 MG PO TABS: 50.0000 mg | ORAL_TABLET | Freq: Four times a day (QID) | ORAL | Status: DC | PRN

## 2011-12-19 MED ORDER — NITROFURANTOIN MONOHYD MACRO 100 MG PO CAPS
100.0000 mg | ORAL_CAPSULE | Freq: Two times a day (BID) | ORAL | Status: DC
  Administered 2011-12-19: 100 mg via ORAL
  Filled 2011-12-19: qty 1

## 2011-12-19 NOTE — ED Notes (Signed)
Pt c/o lower abd pain with nausea, urinary frequency and foul smelling urine.

## 2011-12-19 NOTE — ED Provider Notes (Signed)
History     CSN: [id number]  Arrival date & time 12/19/11  0802   First MD Initiated Contact with Patient 12/19/11 [phone]      Chief Complaint  Patient presents with  . Abdominal Pain  . Nausea  . Urinary Frequency    (Consider location/radiation/quality/duration/timing/severity/associated sxs/prior treatment) HPI Comments: Patient c/o sharp, suprapubic pain for several months.  States that she has been evaluated several times by her GI, Dr. Karilyn Cota, and her PMD for her gastroparesis and IBS.  She states this pain is different that her typical abdominal pain.  She c/o of a constant burning sensation to her pelvic area.  Also noticed a foul smell to her urine.  She also c/o nausea, chills.  She denies vomiting, hematuria, chest pain, shortness of breath, or radiation of her pain.  She has hx of partial hysterectomy with no recent GYN visits.    Patient is a 56 y.o. female presenting with abdominal pain. The history is provided by the patient.  Abdominal Pain The primary symptoms of the illness include abdominal pain and nausea. The primary symptoms of the illness do not include fever, fatigue, shortness of breath, vomiting, diarrhea, hematemesis, dysuria, vaginal discharge or vaginal bleeding. The current episode started more than 2 days ago. The onset of the illness was gradual. The problem has been gradually worsening.  The abdominal pain began more than 2 days ago. The pain came on gradually. The abdominal pain has been gradually worsening since its onset. The abdominal pain is located in the suprapubic region. The abdominal pain does not radiate. The abdominal pain is relieved by nothing. The abdominal pain is exacerbated by eating.  The patient states that she believes she is currently not pregnant. The patient has not had a change in bowel habit. Additional symptoms associated with the illness include chills, urgency and frequency. Symptoms associated with the illness do not include  anorexia, heartburn, constipation, hematuria or back pain. Associated symptoms comments: Burning with urination. Associated medical issues comments: IBS, gastroparesis, GERD.    Past Medical History  Diagnosis Date  . IBS (irritable bowel syndrome)   . Arthritis   . Anxiety   . GERD (gastroesophageal reflux disease)   . Gastroparesis     Past Surgical History  Procedure Date  . Abdominal hysterectomy   . Colonoscopy   . Cholecystectomy   . Upper gastrointestinal endoscopy   . Cesarean section     History reviewed. No pertinent family history.  History  Substance Use Topics  . Smoking status: Current Every Day Smoker -- 0.7 packs/day for 23 years    Types: Cigarettes  . Smokeless tobacco: Not on file     Comment: 1 pack a day  since age 70  . Alcohol Use: No    OB History    Grav Para Term Preterm Abortions TAB SAB Ect Mult Living                  Review of Systems  Constitutional: Positive for chills. Negative for fever, appetite change and fatigue.  Respiratory: Negative for shortness of breath.   Cardiovascular: Negative for chest pain.  Gastrointestinal: Positive for nausea and abdominal pain. Negative for heartburn, vomiting, diarrhea, constipation, blood in stool, abdominal distention, rectal pain, anorexia and hematemesis.  Genitourinary: Positive for urgency, frequency and pelvic pain. Negative for dysuria, hematuria, flank pain, decreased urine volume, vaginal bleeding, vaginal discharge, difficulty urinating and menstrual problem.  Musculoskeletal: Negative for back pain.  Skin: Negative for color  change and rash.  Neurological: Negative for dizziness, weakness and numbness.  Hematological: Negative for adenopathy.  All other systems reviewed and are negative.    Allergies  Codeine; Reglan; and Penicillins  Home Medications   Current Outpatient Rx  Name  Route  Sig  Dispense  Refill  . ALPRAZOLAM 0.5 MG PO TABS   Oral   Take 0.5 mg by mouth 2  (two) times daily.         . ERYTHROMYCIN BASE 250 MG PO TABS   Oral   Take 1 tablet (250 mg total) by mouth 4 (four) times daily -  with meals and at bedtime.   120 tablet   0   . BACID PO TABS   Oral   Take 1 tablet by mouth daily.          Marland Kitchen ONDANSETRON HCL 4 MG PO TABS   Oral   Take 4 mg by mouth every 12 (twelve) hours as needed. nausea         . PANTOPRAZOLE SODIUM 40 MG PO TBEC   Oral   Take 40 mg by mouth 2 (two) times daily before a meal.         . PROMETHAZINE HCL 25 MG PO TABS   Oral   Take 1 tablet (25 mg total) by mouth every 6 (six) hours as needed for nausea.   30 tablet   1   . SENNA PO   Oral   Take 2 tablets by mouth daily.         . SUCRALFATE 1 GM/10ML PO SUSP   Oral   Take 10 mLs (1 g total) by mouth 4 (four) times daily.   420 mL   1     BP 137/74  Pulse 64  Temp 97.9 F (36.6 C) (Oral)  Resp 18  Ht 5\' 3"  (1.6 m)  Wt 150 lb (68.04 kg)  BMI 26.57 kg/m2  SpO2 98%  Physical Exam  Nursing note and vitals reviewed. Constitutional: She is oriented to person, place, and time. She appears well-developed and well-nourished. No distress.  HENT:  Head: Normocephalic and atraumatic.  Mouth/Throat: Oropharynx is clear and moist.  Cardiovascular: Normal rate, regular rhythm, normal heart sounds and intact distal pulses.   No murmur heard. Pulmonary/Chest: Effort normal and breath sounds normal.  Abdominal: Soft. Bowel sounds are normal. She exhibits no distension and no mass. There is no hepatosplenomegaly. There is tenderness in the suprapubic area. There is no rigidity, no rebound, no guarding, no CVA tenderness and no tenderness at McBurney's point.         ttp of the suprapubic region.  No guarding or rebound tenderness.  No CVA tenderness  Genitourinary: No bleeding around the vagina. No foreign body around the vagina. No vaginal discharge found.       Diffuse tenderness on bimanual exam.  Uterus is absent.  Ovaries not palpated due  to body habitus.    Musculoskeletal: She exhibits no edema.  Neurological: She is alert and oriented to person, place, and time. She exhibits normal muscle tone. Coordination normal.  Skin: Skin is warm and dry.    ED Course  Procedures (including critical care time)  Results for orders placed during the hospital encounter of 12/19/11  URINALYSIS, ROUTINE W REFLEX MICROSCOPIC      Component Value Range   Color, Urine YELLOW  YELLOW   APPearance CLEAR  CLEAR   Specific Gravity, Urine 1.010  1.005 - 1.030  pH 6.5  5.0 - 8.0   Glucose, UA NEGATIVE  NEGATIVE mg/dL   Hgb urine dipstick NEGATIVE  NEGATIVE   Bilirubin Urine NEGATIVE  NEGATIVE   Ketones, ur NEGATIVE  NEGATIVE mg/dL   Protein, ur NEGATIVE  NEGATIVE mg/dL   Urobilinogen, UA 0.2  0.0 - 1.0 mg/dL   Nitrite NEGATIVE  NEGATIVE   Leukocytes, UA SMALL (*) NEGATIVE  CBC WITH DIFFERENTIAL      Component Value Range   WBC 7.7  4.0 - 10.5 K/uL   RBC 4.36  3.87 - 5.11 MIL/uL   Hemoglobin 14.6  12.0 - 15.0 g/dL   HCT 78.2  95.6 - 21.3 %   MCV 94.5  78.0 - 100.0 fL   MCH 33.5  26.0 - 34.0 pg   MCHC 35.4  30.0 - 36.0 g/dL   RDW 08.6  57.8 - 46.9 %   Platelets 218  150 - 400 K/uL   Neutrophils Relative 61  43 - 77 %   Neutro Abs 4.6  1.7 - 7.7 K/uL   Lymphocytes Relative 31  12 - 46 %   Lymphs Abs 2.4  0.7 - 4.0 K/uL   Monocytes Relative 7  3 - 12 %   Monocytes Absolute 0.5  0.1 - 1.0 K/uL   Eosinophils Relative 2  0 - 5 %   Eosinophils Absolute 0.1  0.0 - 0.7 K/uL   Basophils Relative 0  0 - 1 %   Basophils Absolute 0.0  0.0 - 0.1 K/uL  BASIC METABOLIC PANEL      Component Value Range   Sodium 136  135 - 145 mEq/L   Potassium 4.0  3.5 - 5.1 mEq/L   Chloride 101  96 - 112 mEq/L   CO2 26  19 - 32 mEq/L   Glucose, Bld 106 (*) 70 - 99 mg/dL   BUN 8  6 - 23 mg/dL   Creatinine, Ser 6.29  0.50 - 1.10 mg/dL   Calcium 9.6  8.4 - 52.8 mg/dL   GFR calc non Af Amer >90  >90 mL/min   GFR calc Af Amer >90  >90 mL/min  WET PREP,  GENITAL      Component Value Range   Yeast Wet Prep HPF POC NONE SEEN  NONE SEEN   Trich, Wet Prep NONE SEEN  NONE SEEN   Clue Cells Wet Prep HPF POC FEW (*) NONE SEEN   WBC, Wet Prep HPF POC FEW (*) NONE SEEN  URINE MICROSCOPIC-ADD ON      Component Value Range   Squamous Epithelial / LPF FEW (*) RARE   WBC, UA 3-6  <3 WBC/hpf   RBC / HPF 0-2  <3 RBC/hpf   Bacteria, UA FEW (*) RARE      CT abd/pelvis from 9/13 revealed:  1. No acute process in the abdomen or pelvis.  2. Probable mild hepatic steatosis.  3. 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  Pt has a NM gastric emptying study scheduled for 12/23/11 by Dr. Patty Sermons office    MDM   Urine culture is pending.  I will treat for UTI.  Hx is concerning for possible interstitial cystitis.  Pain to lower abd is different from IBS pain per patient.  Will refer to urology   Previous ED charts reviewed by me.  Patient seen at [facility] 11/30/11 for abd pain, nausea   Prescribed: Ultram pyridium macrobid  Addie Cederberg L. [place], [place] 12/20/11 2059

## 2011-12-19 NOTE — ED Notes (Signed)
Pt concerned about the burning in her upper and lower stomach area, unsure of what medication to take, spoke with Tammy PA who wrote additional prescription for pt and advised pt to continue taking carfate for upper abd area. Pt expressed understanding.

## 2011-12-21 LAB — GC/CHLAMYDIA PROBE AMP, GENITAL: Chlamydia, DNA Probe: NEGATIVE

## 2011-12-21 NOTE — ED Provider Notes (Signed)
Medical screening examination/treatment/procedure(s) were performed by non-physician practitioner and as supervising physician I was immediately available for consultation/collaboration.  Shelda Jakes, MD 12/21/11 [phone]

## 2011-12-22 LAB — URINE CULTURE

## 2011-12-23 ENCOUNTER — Encounter (HOSPITAL_COMMUNITY)
Admission: RE | Admit: 2011-12-23 | Discharge: 2011-12-23 | Disposition: A | Discharge status: Home / Self Care | Payer: BC Managed Care – PPO | Source: Ambulatory Visit | Attending: Internal Medicine | Admitting: Internal Medicine

## 2011-12-23 ENCOUNTER — Encounter (HOSPITAL_COMMUNITY): Payer: Self-pay

## 2011-12-23 DIAGNOSIS — R1013 Epigastric pain: Secondary | ICD-10-CM | POA: Insufficient documentation

## 2011-12-23 DIAGNOSIS — K3184 Gastroparesis: Secondary | ICD-10-CM | POA: Insufficient documentation

## 2011-12-23 DIAGNOSIS — K3189 Other diseases of stomach and duodenum: Secondary | ICD-10-CM | POA: Insufficient documentation

## 2011-12-23 DIAGNOSIS — R110 Nausea: Secondary | ICD-10-CM | POA: Insufficient documentation

## 2011-12-23 MED ORDER — TECHNETIUM TC 99M SULFUR COLLOID
2.0000 | Freq: Once | INTRAVENOUS | Status: AC | PRN
  Administered 2011-12-23: 1.9 via ORAL

## 2011-12-24 NOTE — Progress Notes (Signed)
Orletta said she will call and schedule the apt at a later date. She said she also told Dorene Ar, NP.

## 2012-01-13 ENCOUNTER — Ambulatory Visit (INDEPENDENT_AMBULATORY_CARE_PROVIDER_SITE_OTHER): Payer: BC Managed Care – PPO | Admitting: Internal Medicine

## 2012-02-19 ENCOUNTER — Ambulatory Visit (INDEPENDENT_AMBULATORY_CARE_PROVIDER_SITE_OTHER): Payer: BC Managed Care – PPO | Admitting: Urology

## 2012-02-19 DIAGNOSIS — N952 Postmenopausal atrophic vaginitis: Secondary | ICD-10-CM

## 2012-02-19 DIAGNOSIS — IMO0002 Reserved for concepts with insufficient information to code with codable children: Secondary | ICD-10-CM

## 2012-02-19 DIAGNOSIS — N949 Unspecified condition associated with female genital organs and menstrual cycle: Secondary | ICD-10-CM

## 2012-02-19 DIAGNOSIS — N390 Urinary tract infection, site not specified: Secondary | ICD-10-CM

## 2012-03-08 ENCOUNTER — Other Ambulatory Visit: Payer: Self-pay | Admitting: Internal Medicine

## 2012-03-08 DIAGNOSIS — R921 Mammographic calcification found on diagnostic imaging of breast: Secondary | ICD-10-CM

## 2012-03-21 ENCOUNTER — Other Ambulatory Visit: Payer: Self-pay | Admitting: Urology

## 2012-03-21 ENCOUNTER — Other Ambulatory Visit: Payer: Self-pay | Admitting: Internal Medicine

## 2012-03-21 ENCOUNTER — Ambulatory Visit
Admission: RE | Admit: 2012-03-21 | Discharge: 2012-03-21 | Disposition: A | Discharge status: Home / Self Care | Payer: BC Managed Care – PPO | Source: Ambulatory Visit | Attending: Internal Medicine | Admitting: Internal Medicine

## 2012-03-21 DIAGNOSIS — R921 Mammographic calcification found on diagnostic imaging of breast: Secondary | ICD-10-CM

## 2014-02-14 ENCOUNTER — Emergency Department (HOSPITAL_COMMUNITY)
Admission: EM | Admit: 2014-02-14 | Discharge: 2014-02-14 | Discharge status: Against Medical Advice | Payer: BLUE CROSS/BLUE SHIELD | Attending: Emergency Medicine | Admitting: Emergency Medicine

## 2014-02-14 ENCOUNTER — Encounter (HOSPITAL_COMMUNITY): Payer: Self-pay | Admitting: *Deleted

## 2014-02-14 DIAGNOSIS — R0602 Shortness of breath: Secondary | ICD-10-CM | POA: Insufficient documentation

## 2014-02-14 DIAGNOSIS — R197 Diarrhea, unspecified: Secondary | ICD-10-CM | POA: Insufficient documentation

## 2014-02-14 DIAGNOSIS — M1990 Unspecified osteoarthritis, unspecified site: Secondary | ICD-10-CM | POA: Insufficient documentation

## 2014-02-14 DIAGNOSIS — R5383 Other fatigue: Secondary | ICD-10-CM | POA: Diagnosis not present

## 2014-02-14 DIAGNOSIS — R0789 Other chest pain: Secondary | ICD-10-CM | POA: Diagnosis not present

## 2014-02-14 DIAGNOSIS — R509 Fever, unspecified: Secondary | ICD-10-CM | POA: Diagnosis not present

## 2014-02-14 DIAGNOSIS — F419 Anxiety disorder, unspecified: Secondary | ICD-10-CM | POA: Insufficient documentation

## 2014-02-14 DIAGNOSIS — Z880 Allergy status to penicillin: Secondary | ICD-10-CM | POA: Insufficient documentation

## 2014-02-14 DIAGNOSIS — K589 Irritable bowel syndrome without diarrhea: Secondary | ICD-10-CM | POA: Diagnosis not present

## 2014-02-14 DIAGNOSIS — R300 Dysuria: Secondary | ICD-10-CM | POA: Insufficient documentation

## 2014-02-14 DIAGNOSIS — Z79899 Other long term (current) drug therapy: Secondary | ICD-10-CM | POA: Diagnosis not present

## 2014-02-14 DIAGNOSIS — Z720 Tobacco use: Secondary | ICD-10-CM | POA: Insufficient documentation

## 2014-02-14 DIAGNOSIS — R630 Anorexia: Secondary | ICD-10-CM | POA: Diagnosis not present

## 2014-02-14 MED ORDER — ONDANSETRON 8 MG PO TBDP: 8.0000 mg | ORAL_TABLET | Freq: Once | ORAL | Status: DC

## 2014-02-14 NOTE — ED Provider Notes (Signed)
CSN: [id number]     Arrival date & time 02/14/14  8921 History  This chart was scribed for Sharyon Cable, MD by Edison Simon, ED Scribe. This patient was seen in room APA05/APA05 and the patient's care was started at 9:06 AM.    Chief Complaint  Patient presents with  . Fatigue   Patient is a 59 y.o. female presenting with dysuria.  Dysuria Pain quality:  Burning Pain severity:  Moderate Onset quality:  Gradual Duration:  1 month Timing:  Constant Progression:  Worsening Chronicity:  New Recent urinary tract infections: no   Relieved by:  None tried Worsened by:  Nothing tried Ineffective treatments:  None tried Urinary symptoms: foul-smelling urine   Associated symptoms: abdominal pain, fever and vomiting   Risk factors: sexually active     HPI Comments: CHANDREA ZELLMAN is a 59 y.o. female who presents to the Emergency Department complaining of dysuria with associated malodorous urine, fatigue, and decreased appetite with onset 1 month ago. She also reports associated vomiting (can't recall last episode), subjective fever, some diarrhea, and abdominal burning for one month. She notes she recently began taking Percocet and has noticed that abdominal burning is worse when taking it, and states she also has chest tightness and SOB when taking Percocet only. She denies other new medication. She notes recent physical exam with her PCP that revealed abnormal thyroid level and was scheduled for recheck 6 weeks after. She states her gastroparesis is flaring up. She denies syncope or hematemesis.  Past Medical History  Diagnosis Date  . IBS (irritable bowel syndrome)   . Arthritis   . Anxiety   . GERD (gastroesophageal reflux disease)   . Gastroparesis    Past Surgical History  Procedure Laterality Date  . Abdominal hysterectomy    . Colonoscopy    . Cholecystectomy    . Upper gastrointestinal endoscopy    . Cesarean section     No family history on file. History  Substance  Use Topics  . Smoking status: Current Every Day Smoker -- 0.75 packs/day for 23 years    Types: Cigarettes  . Smokeless tobacco: Not on file     Comment: 1 pack a day  since age 40  . Alcohol Use: No   OB History    No data available     Review of Systems  Constitutional: Positive for fever, appetite change and fatigue.  Respiratory: Positive for chest tightness and shortness of breath.   Gastrointestinal: Positive for vomiting, abdominal pain and diarrhea.  Genitourinary: Positive for dysuria.  Neurological: Negative for syncope.  All other systems reviewed and are negative.     Allergies  Codeine; Reglan; and Penicillins  Home Medications   Prior to Admission medications   Medication Sig Start Date End Date Taking? Authorizing Provider  ALPRAZolam Duanne Moron) 0.5 MG tablet Take 0.5 mg by mouth 2 (two) times daily.    Historical Provider, MD  docusate sodium (COLACE) 100 MG capsule Take 100 mg by mouth 2 (two) times daily.    Historical Provider, MD  lactobacillus acidophilus (BACID) TABS Take 1 tablet by mouth daily.     Historical Provider, MD  nitrofurantoin, macrocrystal-monohydrate, (MACROBID) 100 MG capsule Take 1 capsule (100 mg total) by mouth 2 (two) times daily. For 7 days 12/19/11   Tammy L. Triplett, PA-C  pantoprazole (PROTONIX) 40 MG tablet Take 40 mg by mouth 2 (two) times daily before a meal. 11/12/11   Rogene Houston, MD  phenazopyridine (PYRIDIUM)  200 MG tablet Take 1 tablet (200 mg total) by mouth 3 (three) times daily. 12/19/11   Tammy L. Triplett, PA-C  promethazine (PHENERGAN) 25 MG tablet Take 1 tablet (25 mg total) by mouth every 6 (six) hours as needed for nausea. 11/30/11   Waldemar Dickens, MD  sucralfate (CARAFATE) 1 GM/10ML suspension Take 10 mLs (1 g total) by mouth 4 (four) times daily. 12/17/11 12/16/12  Butch Penny, NP  traMADol (ULTRAM) 50 MG tablet Take 1 tablet (50 mg total) by mouth every 6 (six) hours as needed for pain. 12/19/11   Tammy L.  Triplett, PA-C   BP 146/117 mmHg  Pulse 72  Temp(Src) 97.9 F (36.6 C) (Oral)  Resp 16  Ht 5\' 3"  (1.6 m)  Wt 170 lb (77.111 kg)  BMI 30.12 kg/m2  SpO2 100% Physical Exam  Nursing note and vitals reviewed.   CONSTITUTIONAL: Well developed/well nourished HEAD: Normocephalic/atraumatic EYES: EOMI/PERRL ENMT: Mucous membranes moist NECK: supple no meningeal signs SPINE/BACK:entire spine nontender CV: S1/S2 noted, no murmurs/rubs/gallops noted LUNGS: Lungs are clear to auscultation bilaterally, no apparent distress ABDOMEN: soft, nontender, no rebound or guarding, bowel sounds noted throughout abdomen GU:no cva tenderness NEURO: Pt is awake/alert/appropriate, moves all extremitiesx4.  No facial droop.  No pronator drift detected, no ataxia EXTREMITIES: pulses normal/equal, full ROM SKIN: warm, color normal PSYCH: no abnormalities of mood noted, alert and oriented to situation  ED Course  Procedures   DIAGNOSTIC STUDIES: Oxygen Saturation is 100% on room air, normal by my interpretation.    COORDINATION OF CARE: 9:16 AM Discussed treatment plan with patient at beside, the patient agrees with the plan and has no further questions at this time.  Discussed that we will focus on EKG, chem 8 to evaluate renal function and to evaluate for anemia, as well as urinalysis for UTI.  I advised that if these are unremarkable, then she will need close PCP followup to evaluate her fatigue/abdominal burning for over a month.  She has no signs of acute abdominal process.  I have low suspicion for ACS.  No signs of acute CVA.  She requests thyroid testing but I advised this is not always available during ER visit and is best managed by her PCP.     Pt refused further workup and requested to leave AMA after speaking to nursing staff     MDM   Final diagnoses:  Other fatigue  Dysuria    Nursing notes including past medical history and social history reviewed and considered in  documentation   I personally performed the services described in this documentation, which was scribed in my presence. The recorded information has been reviewed and is accurate.      Sharyon Cable, MD 02/14/14 [phone]

## 2014-02-14 NOTE — ED Notes (Signed)
Pt states that she would like to leave and see her regular doctor. Dr. Christy Gentles aware. Pt A&O and in NAD.

## 2014-02-14 NOTE — ED Notes (Signed)
Pt states generalized weakness, burning sensation to entire abdomen. States strong odor of urine also states some pressure and burning with urinating. Also states burning with intercourse.

## 2014-03-09 ENCOUNTER — Encounter (INDEPENDENT_AMBULATORY_CARE_PROVIDER_SITE_OTHER): Payer: Self-pay | Admitting: *Deleted

## 2014-03-12 ENCOUNTER — Ambulatory Visit (INDEPENDENT_AMBULATORY_CARE_PROVIDER_SITE_OTHER): Payer: BLUE CROSS/BLUE SHIELD | Admitting: Internal Medicine

## 2014-03-19 ENCOUNTER — Ambulatory Visit (INDEPENDENT_AMBULATORY_CARE_PROVIDER_SITE_OTHER): Payer: BLUE CROSS/BLUE SHIELD | Admitting: Internal Medicine

## 2014-03-28 ENCOUNTER — Encounter (INDEPENDENT_AMBULATORY_CARE_PROVIDER_SITE_OTHER): Payer: Self-pay | Admitting: Internal Medicine

## 2014-03-28 ENCOUNTER — Ambulatory Visit (INDEPENDENT_AMBULATORY_CARE_PROVIDER_SITE_OTHER): Payer: BLUE CROSS/BLUE SHIELD | Admitting: Internal Medicine

## 2014-03-28 VITALS — BP 122/80 | HR 76 | Temp 97.9°F | Ht 63.0 in | Wt 173.8 lb

## 2014-03-28 DIAGNOSIS — K3184 Gastroparesis: Secondary | ICD-10-CM

## 2014-03-28 DIAGNOSIS — R195 Other fecal abnormalities: Secondary | ICD-10-CM

## 2014-03-28 LAB — HEPATIC FUNCTION PANEL
ALBUMIN: 4.1 g/dL (ref 3.5–5.2)
ALT: 32 U/L (ref 0–35)
AST: 20 U/L (ref 0–37)
Alkaline Phosphatase: 97 U/L (ref 39–117)
BILIRUBIN DIRECT: 0.1 mg/dL (ref 0.0–0.3)
Indirect Bilirubin: 0.4 mg/dL (ref 0.2–1.2)
TOTAL PROTEIN: 7.2 g/dL (ref 6.0–8.3)
Total Bilirubin: 0.5 mg/dL (ref 0.2–1.2)

## 2014-03-28 MED ORDER — PROMETHAZINE HCL 25 MG PO TABS: 25.0000 mg | ORAL_TABLET | Freq: Four times a day (QID) | ORAL | Status: DC | PRN

## 2014-03-28 NOTE — Patient Instructions (Signed)
GI pathogen. Hepatic function. Rx for Phenergan 25mg  # 30

## 2014-03-28 NOTE — Progress Notes (Signed)
   Subjective:    Patient ID: Haley Graham, female    DOB: [birth year], 59 y.o.   MRN: [id number]  HPI PCP Dr. Woody Seller. Presents today with c/o gastroparesis and small hernia. She tells me she has nausea. Her stools are sticky and green. She has a BM every day. She tells me she became nervous and depressed in January.  She became depressed. She had burning in her abdomen. She says she would eat and go to bed. She says; her stool had a foul odor. She has a BM about daily. She was checked by Dr. Woody Seller for C-diff and it was negative. She has gained 12 pounds since her last visit in 2013. She is eating one full meal a day. She eats a sandwich in the morning. She is avoiding heavy foods. Appetite is not good. She was on Cipro for a cold 4 weeks ago.    05/2005 Gastric empyting study:Findings: After 120 minutes, approximately 43% of tracer remains in the stomach. This is greater than normal range (normal range less than 30%).  IMPRESSION:  Evidence of delayed gastric emptying/gastroparesis.   EGD 11/12/2011: Nausea and dysphagia:Impression:  Small sliding hiatal hernia otherwise normal esophagogastroduodenoscopy.  Esophagus dilated by passing 56 French Maloney dilator but no mucosal disruption noted.    12/23/2011 NM Gastric emptying study; 1. Delayed gastric emptying consistent with gastroparesis.     Review of Systems Past Medical History  Diagnosis Date  . IBS (irritable bowel syndrome)   . Arthritis   . Anxiety   . GERD (gastroesophageal reflux disease)   . Gastroparesis     Past Surgical History  Procedure Laterality Date  . Abdominal hysterectomy    . Colonoscopy    . Cholecystectomy    . Upper gastrointestinal endoscopy    . Cesarean section      Allergies  Allergen Reactions  . Codeine Nausea And Vomiting  . Reglan [Metoclopramide] Other (See Comments)    Heart races  . Penicillins Rash    Current Outpatient Prescriptions on File Prior to Visit  Medication  Sig Dispense Refill  . ALPRAZolam (XANAX) 0.5 MG tablet Take 0.5 mg by mouth 2 (two) times daily.    . pantoprazole (PROTONIX) 40 MG tablet Take 40 mg by mouth 2 (two) times daily before a meal.    . promethazine (PHENERGAN) 25 MG tablet Take 1 tablet (25 mg total) by mouth every 6 (six) hours as needed for nausea. (Patient not taking: Reported on 02/14/2014) 30 tablet 1  . sucralfate (CARAFATE) 1 GM/10ML suspension Take 10 mLs (1 g total) by mouth 4 (four) times daily. 420 mL 1   No current facility-administered medications on file prior to visit.        Objective:   Physical Exam  Filed Vitals:   03/28/14 0927  Height: 5\' 3"  (1.6 m)  Weight: 173 lb 12.8 oz (78.835 kg)  Alert and oriented. Skin warm and dry. Oral mucosa is moist.   . Sclera anicteric, conjunctivae is pink. Thyroid not enlarged. No cervical lymphadenopathy. Lungs clear. Heart regular rate and rhythm.  Abdomen is soft. Bowel sounds are positive. No hepatomegaly. No abdominal masses felt. No tenderness.          Assessment & Plan:  Gastroparesis. Chronic nausea. Change in her stools: Am going to get a GI pathogen.   Six small meals a day.

## 2014-03-29 ENCOUNTER — Telehealth (INDEPENDENT_AMBULATORY_CARE_PROVIDER_SITE_OTHER): Payer: Self-pay | Admitting: *Deleted

## 2014-03-29 NOTE — Telephone Encounter (Signed)
Haley Graham would like to know if she can take a laxative tonight and would it effect the stool study Haley Graham is doing on her. There return phone number is [phone] or [phone].

## 2014-03-29 NOTE — Telephone Encounter (Signed)
May take a laxative.

## 2014-04-04 ENCOUNTER — Other Ambulatory Visit (INDEPENDENT_AMBULATORY_CARE_PROVIDER_SITE_OTHER): Payer: Self-pay | Admitting: Internal Medicine

## 2014-04-10 LAB — OTHER SOLSTAS TEST: Miscellaneous Test: 65008

## 2014-06-28 ENCOUNTER — Encounter (INDEPENDENT_AMBULATORY_CARE_PROVIDER_SITE_OTHER): Payer: Self-pay | Admitting: Internal Medicine

## 2016-05-26 ENCOUNTER — Ambulatory Visit (INDEPENDENT_AMBULATORY_CARE_PROVIDER_SITE_OTHER): Payer: BLUE CROSS/BLUE SHIELD | Admitting: Internal Medicine

## 2016-05-27 ENCOUNTER — Other Ambulatory Visit (INDEPENDENT_AMBULATORY_CARE_PROVIDER_SITE_OTHER): Payer: Self-pay | Admitting: Internal Medicine

## 2016-05-27 ENCOUNTER — Encounter (INDEPENDENT_AMBULATORY_CARE_PROVIDER_SITE_OTHER): Payer: Self-pay | Admitting: Internal Medicine

## 2016-05-27 ENCOUNTER — Encounter (INDEPENDENT_AMBULATORY_CARE_PROVIDER_SITE_OTHER): Payer: Self-pay | Admitting: *Deleted

## 2016-05-27 ENCOUNTER — Telehealth (INDEPENDENT_AMBULATORY_CARE_PROVIDER_SITE_OTHER): Payer: Self-pay | Admitting: *Deleted

## 2016-05-27 ENCOUNTER — Encounter (INDEPENDENT_AMBULATORY_CARE_PROVIDER_SITE_OTHER): Payer: Self-pay

## 2016-05-27 ENCOUNTER — Ambulatory Visit (INDEPENDENT_AMBULATORY_CARE_PROVIDER_SITE_OTHER): Payer: BLUE CROSS/BLUE SHIELD | Admitting: Internal Medicine

## 2016-05-27 VITALS — BP 160/84 | HR 72 | Temp 98.2°F | Ht 63.0 in | Wt 186.4 lb

## 2016-05-27 DIAGNOSIS — R635 Abnormal weight gain: Secondary | ICD-10-CM | POA: Diagnosis not present

## 2016-05-27 DIAGNOSIS — R195 Other fecal abnormalities: Secondary | ICD-10-CM

## 2016-05-27 DIAGNOSIS — R140 Abdominal distension (gaseous): Secondary | ICD-10-CM | POA: Diagnosis not present

## 2016-05-27 MED ORDER — PEG 3350-KCL-NA BICARB-NACL 420 G PO SOLR: 4000.0000 mL | Freq: Once | ORAL | 0 refills | Status: AC

## 2016-05-27 NOTE — Telephone Encounter (Signed)
Patient needs trilyte 

## 2016-05-27 NOTE — Patient Instructions (Signed)
Colonoscopy.  The risks and benefits such as perforation, bleeding, and infection were reviewed with the patient and is agreeable. 

## 2016-05-27 NOTE — Progress Notes (Signed)
   Subjective:    Patient ID: Haley Graham, female    DOB: Nov 07, 1955, 61 y.o.   MRN: [id number]  HPI  Present today with c/o.nausea. She states for a month she has had nausea. She has a lot of mid abdominal pain. She says sometimes are formed and sometimes they are ribbon like.  She has had weight loss which was intentional. She has not seen any blood in her stools. Sometimes she sees mucous in her stools. She has a BM about 30 minutes after eating.  Having 4-5 stools a day.  She has a lot of gas.  Her appetite is not good. She has early satiety and nausea.  Hx of same. Hx of gastroparesis.   She has had weight gain.  Eating 2-3 small meals a day Her last colonoscopy was greater than 10 yrs by Dr Arnoldo Morale.  07/20/2005 Few small diverticula in the sigmoid colon. Colonoscopy otherwise normal.   Diagnosed with Diabetes in 2016   Last seen 03/28/2014.     05/2005 Gastric empyting study:Findings: After 120 minutes, approximately 43% of tracer remains in the stomach. This is greater than normal range (normal range less than 30%).  IMPRESSION:  Evidence of delayed gastric emptying/gastroparesis.   EGD 11/12/2011: Nausea and dysphagia:Impression:  Small sliding hiatal hernia otherwise normal esophagogastroduodenoscopy.  Esophagus dilated by passing 56 French Maloney dilator but no mucosal disruption noted.   Review of Systems      Past Medical History:  Diagnosis Date  . Anxiety   . Arthritis   . Gastroparesis   . GERD (gastroesophageal reflux disease)   . IBS (irritable bowel syndrome)     Past Surgical History:  Procedure Laterality Date  . ABDOMINAL HYSTERECTOMY    . CESAREAN SECTION    . CHOLECYSTECTOMY    . COLONOSCOPY    . UPPER GASTROINTESTINAL ENDOSCOPY      Allergies  Allergen Reactions  . Codeine Nausea And Vomiting  . Reglan [Metoclopramide] Other (See Comments)    Heart races  . Penicillins Rash    Current Outpatient Prescriptions on File Prior to Visit   Medication Sig Dispense Refill  . ALPRAZolam (XANAX) 0.5 MG tablet Take 0.5 mg by mouth 2 (two) times daily.    . citalopram (CELEXA) 40 MG tablet Take 40 mg by mouth daily.    . pantoprazole (PROTONIX) 40 MG tablet Take 40 mg by mouth 2 (two) times daily before a meal.     No current facility-administered medications on file prior to visit.        Objective:   Physical Exam Blood pressure (!) 160/84, pulse 72, temperature 98.2 F (36.8 C), height 5\' 3"  (1.6 m), weight 186 lb 6.4 oz (84.6 kg). Alert and oriented. Skin warm and dry. Oral mucosa is moist.   . Sclera anicteric, conjunctivae is pink. Thyroid not enlarged. No cervical lymphadenopathy. Lungs clear. Heart regular rate and rhythm.  Abdomen is soft. Bowel sounds are positive. No hepatomegaly. No abdominal masses felt. No tenderness.  No edema to lower extremities.  Abdomen is obese. Not tense.          Assessment & Plan:  Change in stool. Will schedule a colonoscopy with Dr. Laural Golden.  Bloating, weight gain: Am going to get an US abdomen.  She declined a Gastric emptying study.

## 2016-05-28 DIAGNOSIS — R195 Other fecal abnormalities: Secondary | ICD-10-CM | POA: Insufficient documentation

## 2016-06-02 ENCOUNTER — Ambulatory Visit (HOSPITAL_COMMUNITY)
Admission: RE | Admit: 2016-06-02 | Discharge: 2016-06-02 | Disposition: A | Discharge status: Home / Self Care | Payer: BLUE CROSS/BLUE SHIELD | Source: Ambulatory Visit | Attending: Internal Medicine | Admitting: Internal Medicine

## 2016-06-02 DIAGNOSIS — R140 Abdominal distension (gaseous): Secondary | ICD-10-CM | POA: Diagnosis present

## 2016-06-02 DIAGNOSIS — N281 Cyst of kidney, acquired: Secondary | ICD-10-CM | POA: Diagnosis not present

## 2016-06-02 DIAGNOSIS — R110 Nausea: Secondary | ICD-10-CM | POA: Insufficient documentation

## 2016-06-02 DIAGNOSIS — Z9049 Acquired absence of other specified parts of digestive tract: Secondary | ICD-10-CM | POA: Diagnosis not present

## 2016-06-02 DIAGNOSIS — K760 Fatty (change of) liver, not elsewhere classified: Secondary | ICD-10-CM | POA: Diagnosis not present

## 2016-06-02 DIAGNOSIS — R195 Other fecal abnormalities: Secondary | ICD-10-CM | POA: Insufficient documentation

## 2016-06-08 ENCOUNTER — Telehealth (INDEPENDENT_AMBULATORY_CARE_PROVIDER_SITE_OTHER): Payer: Self-pay | Admitting: Internal Medicine

## 2016-06-08 NOTE — Telephone Encounter (Signed)
Patient called, left a message after hours and stated that she would like to know what her results were from a test she had done last Tuesday.  She did state that she would like to hear something before Monday, but she called Friday at 1:18pm, the office closed at 11:30am.  [phone]

## 2016-06-08 NOTE — Telephone Encounter (Signed)
Results have been given to patient 

## 2016-06-10 ENCOUNTER — Encounter (HOSPITAL_COMMUNITY): Payer: Self-pay | Admitting: *Deleted

## 2016-06-10 ENCOUNTER — Encounter (HOSPITAL_COMMUNITY)
Admission: RE | Disposition: A | Discharge status: Home / Self Care | Payer: Self-pay | Source: Ambulatory Visit | Attending: Internal Medicine

## 2016-06-10 ENCOUNTER — Ambulatory Visit (HOSPITAL_COMMUNITY)
Admission: RE | Admit: 2016-06-10 | Discharge: 2016-06-10 | Disposition: A | Discharge status: Home / Self Care | Payer: BLUE CROSS/BLUE SHIELD | Source: Ambulatory Visit | Attending: Internal Medicine | Admitting: Internal Medicine

## 2016-06-10 DIAGNOSIS — K5730 Diverticulosis of large intestine without perforation or abscess without bleeding: Secondary | ICD-10-CM | POA: Insufficient documentation

## 2016-06-10 DIAGNOSIS — F419 Anxiety disorder, unspecified: Secondary | ICD-10-CM | POA: Diagnosis not present

## 2016-06-10 DIAGNOSIS — Z79899 Other long term (current) drug therapy: Secondary | ICD-10-CM | POA: Insufficient documentation

## 2016-06-10 DIAGNOSIS — Z7951 Long term (current) use of inhaled steroids: Secondary | ICD-10-CM | POA: Insufficient documentation

## 2016-06-10 DIAGNOSIS — F17210 Nicotine dependence, cigarettes, uncomplicated: Secondary | ICD-10-CM | POA: Diagnosis not present

## 2016-06-10 DIAGNOSIS — D120 Benign neoplasm of cecum: Secondary | ICD-10-CM | POA: Insufficient documentation

## 2016-06-10 DIAGNOSIS — R195 Other fecal abnormalities: Secondary | ICD-10-CM | POA: Insufficient documentation

## 2016-06-10 DIAGNOSIS — K6289 Other specified diseases of anus and rectum: Secondary | ICD-10-CM | POA: Insufficient documentation

## 2016-06-10 DIAGNOSIS — K219 Gastro-esophageal reflux disease without esophagitis: Secondary | ICD-10-CM | POA: Diagnosis not present

## 2016-06-10 DIAGNOSIS — R194 Change in bowel habit: Secondary | ICD-10-CM | POA: Insufficient documentation

## 2016-06-10 DIAGNOSIS — D123 Benign neoplasm of transverse colon: Secondary | ICD-10-CM | POA: Insufficient documentation

## 2016-06-10 DIAGNOSIS — Z8719 Personal history of other diseases of the digestive system: Secondary | ICD-10-CM | POA: Diagnosis not present

## 2016-06-10 HISTORY — PX: POLYPECTOMY: SHX5525

## 2016-06-10 HISTORY — PX: COLONOSCOPY: SHX5424

## 2016-06-10 SURGERY — COLONOSCOPY
Anesthesia: Moderate Sedation

## 2016-06-10 MED ORDER — MIDAZOLAM HCL 5 MG/5ML IJ SOLN
INTRAMUSCULAR | Status: DC | PRN
  Administered 2016-06-10 (×6): 2 mg via INTRAVENOUS

## 2016-06-10 MED ORDER — MEPERIDINE HCL 50 MG/ML IJ SOLN
INTRAMUSCULAR | Status: DC | PRN
Start: 2016-06-10 — End: 2016-06-10
  Administered 2016-06-10 (×2): 25 mg via INTRAVENOUS

## 2016-06-10 MED ORDER — DICYCLOMINE HCL 10 MG PO CAPS: 10.0000 mg | ORAL_CAPSULE | Freq: Two times a day (BID) | ORAL | 5 refills | Status: DC

## 2016-06-10 MED ORDER — MIDAZOLAM HCL 5 MG/5ML IJ SOLN
INTRAMUSCULAR | Status: AC
  Filled 2016-06-10: qty 5

## 2016-06-10 MED ORDER — MEPERIDINE HCL 50 MG/ML IJ SOLN
INTRAMUSCULAR | Status: AC
  Filled 2016-06-10: qty 1

## 2016-06-10 MED ORDER — MIDAZOLAM HCL 5 MG/5ML IJ SOLN
INTRAMUSCULAR | Status: AC
  Filled 2016-06-10: qty 10

## 2016-06-10 MED ORDER — SODIUM CHLORIDE 0.9 % IV SOLN
INTRAVENOUS | Status: DC
  Administered 2016-06-10: 1000 mL via INTRAVENOUS

## 2016-06-10 MED ORDER — STERILE WATER FOR IRRIGATION IR SOLN
Status: DC | PRN
  Administered 2016-06-10: 08:00:00

## 2016-06-10 NOTE — H&P (Signed)
Haley Graham is an 61 y.o. female.   Chief Complaint: Patient is here for colonoscopy. HPI: Patient is 60 year old Caucasian female has history of IBS but has noted change in her bowel habits over the last few months. She goes to the bathroom 4-6 times. Stool consistency days. Sometimes she passes ribbon like stools. She denies rectal bleeding. She also complains of LLQ abdominal pain. Last colonoscopy was in 2007. Family history is negative for CRC.  Past Medical History:  Diagnosis Date  . Anxiety   . Arthritis   . Gastroparesis   . GERD (gastroesophageal reflux disease)   . IBS (irritable bowel syndrome)     Past Surgical History:  Procedure Laterality Date  . ABDOMINAL HYSTERECTOMY    . CESAREAN SECTION    . CHOLECYSTECTOMY    . COLONOSCOPY    . UPPER GASTROINTESTINAL ENDOSCOPY      History reviewed. No pertinent family history. Social History:  reports that she has been smoking Cigarettes.  She has a 17.25 pack-year smoking history. She has never used smokeless tobacco. She reports that she does not drink alcohol or use drugs.  Allergies:  Allergies  Allergen Reactions  . Codeine Nausea And Vomiting  . Reglan [Metoclopramide] Other (See Comments)    Heart races  . Penicillins Rash    Has patient had a PCN reaction causing immediate rash, facial/tongue/throat swelling, SOB or lightheadedness with hypotension: yes  Has patient had a PCN reaction causing severe rash involving mucus membranes or skin necrosis: no Has patient had a PCN reaction that required hospitalization no Has patient had a PCN reaction occurring within the last 10 years:no If all of the above answers are "NO", then may proceed with Cephalosporin use.    Medications Prior to Admission  Medication Sig Dispense Refill  . albuterol (VENTOLIN HFA) 108 (90 Base) MCG/ACT inhaler Inhale 2 puffs into the lungs every 6 (six) hours as needed for wheezing or shortness of breath.     . ALPRAZolam (XANAX) 0.5 MG  tablet Take 0.5 mg by mouth 3 (three) times daily as needed for anxiety.     . citalopram (CELEXA) 40 MG tablet Take 40 mg by mouth at bedtime.     . fluticasone (FLONASE) 50 MCG/ACT nasal spray Place 2 sprays into both nostrils daily.  11  . metoprolol succinate (TOPROL-XL) 25 MG 24 hr tablet Take 25 mg by mouth daily.    . ondansetron (ZOFRAN) 4 MG tablet Take 4 mg by mouth 3 (three) times daily as needed for nausea.  1  . pantoprazole (PROTONIX) 40 MG tablet Take 40 mg by mouth daily.     . rosuvastatin (CRESTOR) 10 MG tablet Take 10 mg by mouth daily.      No results found for this or any previous visit (from the past 48 hour(s)). No results found.  ROS  Blood pressure 129/69, pulse 68, temperature 98.1 F (36.7 C), temperature source Oral, resp. rate 19, height 5\' 3"  (1.6 m), weight 181 lb (82.1 kg), SpO2 98 %. Physical Exam  Constitutional: She appears well-developed and well-nourished.  HENT:  Mouth/Throat: Oropharynx is clear and moist.  Eyes: Conjunctivae are normal. No scleral icterus.  Neck: No thyromegaly present.  Cardiovascular: Normal rate, regular rhythm and normal heart sounds.   No murmur heard. Respiratory: Effort normal and breath sounds normal.  GI:  Abdomen is full but soft and nontender without organomegaly or masses.  Musculoskeletal: She exhibits no edema.  Lymphadenopathy:    She has  no cervical adenopathy.  Neurological: She is alert.  Skin: Skin is warm and dry.     Assessment/Plan Change in bowel habits. History of IBS. Diagnostic colonoscopy.  Hildred Laser, MD 06/10/2016, 8:20 AM

## 2016-06-10 NOTE — Op Note (Signed)
[facility] Patient Name: Haley Graham Procedure Date: 06/10/2016 8:01 AM MRN: [id number] Date of Birth: [birth year] Attending MD: Hildred Laser , MD CSN: [id number] Age: 61 Admit Type: Outpatient Procedure:                Colonoscopy Indications:              Change in bowel habits Providers:                Hildred Laser, MD, Otis Peak B. Gwenlyn Perking RN, RN, Aram Candela Referring MD:              Medicines:                Meperidine 50 mg IV, Midazolam 12 mg IV Complications:            No immediate complications. Estimated Blood Loss:     Estimated blood loss was minimal. Procedure:                Pre-Anesthesia Assessment:                           - Prior to the procedure, a History and Physical                            was performed, and patient medications and                            allergies were reviewed. The patient's tolerance of                            previous anesthesia was also reviewed. The risks                            and benefits of the procedure and the sedation                            options and risks were discussed with the patient.                            All questions were answered, and informed consent                            was obtained. Prior Anticoagulants: The patient has                            taken no previous anticoagulant or antiplatelet                            agents. ASA Grade Assessment: II - A patient with                            mild systemic disease. After reviewing the risks  and benefits, the patient was deemed in                            satisfactory condition to undergo the procedure.                           After obtaining informed consent, the colonoscope                            was passed under direct vision. Throughout the                            procedure, the patient's blood pressure, pulse, and                            oxygen saturations were  monitored continuously. The                            [id number] ([id number]) was introduced through the                            anus and advanced to the the cecum, identified by                            appendiceal orifice and ileocecal valve. The                            colonoscopy was performed without difficulty. The                            patient tolerated the procedure well. The quality                            of the bowel preparation was good. The ileocecal                            valve, appendiceal orifice, and rectum were                            photographed. Scope In: 8:30:43 AM Scope Out: 8:56:29 AM Scope Withdrawal Time: 0 hours 20 minutes 16 seconds  Total Procedure Duration: 0 hours 25 minutes 46 seconds  Findings:      The perianal and digital rectal examinations were normal.      Two sessile polyps were found in the proximal transverse colon and       cecum. The polyps were small in size. These polyps were removed with a       cold snare. Resection and retrieval were complete. The pathology       specimen was placed into Bottle Number 1.      A 10 mm polyp was found in the hepatic flexure. The polyp was       semi-pedunculated. The polyp was removed with a hot snare. Resection was       complete, and retrieval was complete. The pathology specimen was placed  into Bottle Number 2.      A few small-mouthed diverticula were found in the sigmoid colon.      Anal papilla(e) were hypertrophied. Impression:               - Two small polyps in the proximal transverse colon                            and in the cecum, removed with a cold snare.                            Resected and retrieved.                           - One 10 mm polyp at the hepatic flexure, removed                            with a hot snare. Resected and retrieved.                           - Diverticulosis in the sigmoid colon.                           - Anal papilla(e) were  hypertrophied. Moderate Sedation:      Moderate (conscious) sedation was administered by the endoscopy nurse       and supervised by the endoscopist. The following parameters were       monitored: oxygen saturation, heart rate, blood pressure, CO2       capnography and response to care. Total physician intraservice time was       33 minutes. Recommendation:           - Patient has a contact number available for                            emergencies. The signs and symptoms of potential                            delayed complications were discussed with the                            patient. Return to normal activities tomorrow.                            Written discharge instructions were provided to the                            patient.                           - Resume previous diet today.                           - Continue present medications.                           - No aspirin, ibuprofen, naproxen, or other  non-steroidal anti-inflammatory drugs for 7 days                            after polyp removal.                           - Await pathology results.                           - Repeat colonoscopy in 5 years for surveillance. Procedure Code(s):        --- Professional ---                           [phone], Colonoscopy, flexible; with removal of                            tumor(s), polyp(s), or other lesion(s) by snare                            technique                           99152, Moderate sedation services provided by the                            same physician or other qualified health care                            professional performing the diagnostic or                            therapeutic service that the sedation supports,                            requiring the presence of an independent trained                            observer to assist in the monitoring of the                            patient's level of  consciousness and physiological                            status; initial 15 minutes of intraservice time,                            patient age 73 years or older                           [phone], Moderate sedation services; each additional                            15 minutes intraservice time Diagnosis Code(s):        --- Professional ---  D12.3, Benign neoplasm of transverse colon (hepatic                            flexure or splenic flexure)                           D12.0, Benign neoplasm of cecum                           K62.89, Other specified diseases of anus and rectum                           R19.4, Change in bowel habit                           K57.30, Diverticulosis of large intestine without                            perforation or abscess without bleeding CPT copyright 2016 American Medical Association. All rights reserved. The codes documented in this report are preliminary and upon coder review may  be revised to meet current compliance requirements. Hildred Laser, MD Hildred Laser, MD 06/10/2016 9:08:39 AM This report has been signed electronically. Number of Addenda: 0

## 2016-06-10 NOTE — Discharge Instructions (Signed)
No aspirin or NSAIDs for 1 week. Resume usual medications and diet. Dicyclomine 10 mg by mouth 30 minutes before breakfast and evening meal daily. No driving for 24 hours. Physician will call with biopsy results.   Colonoscopy, Adult, Care After This sheet gives you information about how to care for yourself after your procedure. Your doctor may also give you more specific instructions. If you have problems or questions, call your doctor. Follow these instructions at home: General instructions    For the first 24 hours after the procedure:  Do not drive or use machinery.  Do not sign important documents.  Do not drink alcohol.  Do your daily activities more slowly than normal.  Eat foods that are soft and easy to digest.  Rest often.  Take over-the-counter or prescription medicines only as told by your doctor.  It is up to you to get the results of your procedure. Ask your doctor, or the department performing the procedure, when your results will be ready. To help cramping and bloating:   Try walking around.  Put heat on your belly (abdomen) as told by your doctor. Use a heat source that your doctor recommends, such as a moist heat pack or a heating pad.  Put a towel between your skin and the heat source.  Leave the heat on for 20-30 minutes.  Remove the heat if your skin turns bright red. This is especially important if you cannot feel pain, heat, or cold. You can get burned. Eating and drinking   Drink enough fluid to keep your pee (urine) clear or pale yellow.  Return to your normal diet as told by your doctor. Avoid heavy or fried foods that are hard to digest.  Avoid drinking alcohol for as long as told by your doctor. Contact a doctor if:  You have blood in your poop (stool) 2-3 days after the procedure. Get help right away if:  You have more than a small amount of blood in your poop.  You see large clumps of tissue (blood clots) in your poop.  Your  belly is swollen.  You feel sick to your stomach (nauseous).  You throw up (vomit).  You have a fever.  You have belly pain that gets worse, and medicine does not help your pain. This information is not intended to replace advice given to you by your health care provider. Make sure you discuss any questions you have with your health care provider. Document Released: 02/21/2010 Document Revised: 10/14/2015 Document Reviewed: 10/14/2015 Elsevier Interactive Patient Education  2017 [employer]  Diverticulosis Diverticulosis is a condition that develops when small pouches (diverticula) form in the wall of the large intestine (colon). The colon is where water is absorbed and stool is formed. The pouches form when the inside layer of the colon pushes through weak spots in the outer layers of the colon. You may have a few pouches or many of them. What are the causes? The cause of this condition is not known. What increases the risk? The following factors may make you more likely to develop this condition:  Being older than age 41. Your risk for this condition increases with age. Diverticulosis is rare among people younger than age 22. By age 62, many people have it.  Eating a low-fiber diet.  Having frequent constipation.  Being overweight.  Not getting enough exercise.  Smoking.  Taking over-the-counter pain medicines, like aspirin and ibuprofen.  Having a family history of diverticulosis. What are the signs or  symptoms? In most people, there are no symptoms of this condition. If you do have symptoms, they may include:  Bloating.  Cramps in the abdomen.  Constipation or diarrhea.  Pain in the lower left side of the abdomen. How is this diagnosed? This condition is most often diagnosed during an exam for other colon problems. Because diverticulosis usually has no symptoms, it often cannot be diagnosed independently. This condition may be diagnosed by:  Using a flexible  scope to examine the colon (colonoscopy).  Taking an X-ray of the colon after dye has been put into the colon (barium enema).  Doing a CT scan. How is this treated? You may not need treatment for this condition if you have never developed an infection related to diverticulosis. If you have had an infection before, treatment may include:  Eating a high-fiber diet. This may include eating more fruits, vegetables, and grains.  Taking a fiber supplement.  Taking a live bacteria supplement (probiotic).  Taking medicine to relax your colon.  Taking antibiotic medicines. Follow these instructions at home:  Drink 6-8 glasses of water or more each day to prevent constipation.  Try not to strain when you have a bowel movement.  If you have had an infection before:  Eat more fiber as directed by your health care provider or your diet and nutrition specialist (dietitian).  Take a fiber supplement or probiotic, if your health care provider approves.  Take over-the-counter and prescription medicines only as told by your health care provider.  If you were prescribed an antibiotic, take it as told by your health care provider. Do not stop taking the antibiotic even if you start to feel better.  Keep all follow-up visits as told by your health care provider. This is important. Contact a health care provider if:  You have pain in your abdomen.  You have bloating.  You have cramps.  You have not had a bowel movement in 3 days. Get help right away if:  Your pain gets worse.  Your bloating becomes very bad.  You have a fever or chills, and your symptoms suddenly get worse.  You vomit.  You have bowel movements that are bloody or black.  You have bleeding from your rectum. Summary  Diverticulosis is a condition that develops when small pouches (diverticula) form in the wall of the large intestine (colon).  You may have a few pouches or many of them.  This condition is most  often diagnosed during an exam for other colon problems.  If you have had an infection related to diverticulosis, treatment may include increasing the fiber in your diet, taking supplements, or taking medicines. This information is not intended to replace advice given to you by your health care provider. Make sure you discuss any questions you have with your health care provider. Document Released: 10/17/2003 Document Revised: 12/09/2015 Document Reviewed: 12/09/2015 Elsevier Interactive Patient Education  2017 [place].  Colon Polyps Polyps are tissue growths inside the body. Polyps can grow in many places, including the large intestine (colon). A polyp may be a round bump or a mushroom-shaped growth. You could have one polyp or several. Most colon polyps are noncancerous (benign). However, some colon polyps can become cancerous over time. What are the causes? The exact cause of colon polyps is not known. What increases the risk? This condition is more likely to develop in people who:  Have a family history of colon cancer or colon polyps.  Are older than 50 or older  than 45 if they are African American.  Have inflammatory bowel disease, such as ulcerative colitis or Crohn disease.  Are overweight.  Smoke cigarettes.  Do not get enough exercise.  Drink too much alcohol.  Eat a diet that is:  High in fat and red meat.  Low in fiber.  Had childhood cancer that was treated with abdominal radiation. What are the signs or symptoms? Most polyps do not cause symptoms. If you have symptoms, they may include:  Blood coming from your rectum when having a bowel movement.  Blood in your stool.The stool may look dark red or black.  A change in bowel habits, such as constipation or diarrhea. How is this diagnosed? This condition is diagnosed with a colonoscopy. This is a procedure that uses a lighted, flexible scope to look at the inside of your colon. How is this  treated? Treatment for this condition involves removing any polyps that are found. Those polyps will then be tested for cancer. If cancer is found, your health care provider will talk to you about options for colon cancer treatment. Follow these instructions at home: Diet   Eat plenty of fiber, such as fruits, vegetables, and whole grains.  Eat foods that are high in calcium and vitamin D, such as milk, cheese, yogurt, eggs, liver, fish, and broccoli.  Limit foods high in fat, red meats, and processed meats, such as hot dogs, sausage, bacon, and lunch meats.  Maintain a healthy weight, or lose weight if recommended by your health care provider. General instructions   Do not smoke cigarettes.  Do not drink alcohol excessively.  Keep all follow-up visits as told by your health care provider. This is important. This includes keeping regularly scheduled colonoscopies. Talk to your health care provider about when you need a colonoscopy.  Exercise every day or as told by your health care provider. Contact a health care provider if:  You have new or worsening bleeding during a bowel movement.  You have new or increased blood in your stool.  You have a change in bowel habits.  You unexpectedly lose weight. This information is not intended to replace advice given to you by your health care provider. Make sure you discuss any questions you have with your health care provider. Document Released: 10/16/2003 Document Revised: 06/27/2015 Document Reviewed: 12/10/2014 Elsevier Interactive Patient Education  2017 [employer].

## 2016-06-15 ENCOUNTER — Encounter (HOSPITAL_COMMUNITY): Payer: Self-pay | Admitting: Internal Medicine

## 2016-06-20 ENCOUNTER — Encounter (HOSPITAL_COMMUNITY): Payer: Self-pay | Admitting: *Deleted

## 2016-06-20 ENCOUNTER — Emergency Department (HOSPITAL_COMMUNITY): Payer: BLUE CROSS/BLUE SHIELD

## 2016-06-20 ENCOUNTER — Inpatient Hospital Stay (HOSPITAL_COMMUNITY): Payer: BLUE CROSS/BLUE SHIELD

## 2016-06-20 ENCOUNTER — Inpatient Hospital Stay (HOSPITAL_COMMUNITY)
Admission: EM | Admit: 2016-06-20 | Discharge: 2016-08-22 | DRG: 268 | Disposition: A | Discharge status: Home Health | Payer: BLUE CROSS/BLUE SHIELD | Attending: Cardiothoracic Surgery | Admitting: Cardiothoracic Surgery

## 2016-06-20 DIAGNOSIS — R0682 Tachypnea, not elsewhere classified: Secondary | ICD-10-CM | POA: Diagnosis not present

## 2016-06-20 DIAGNOSIS — J440 Chronic obstructive pulmonary disease with acute lower respiratory infection: Secondary | ICD-10-CM | POA: Diagnosis present

## 2016-06-20 DIAGNOSIS — Z87440 Personal history of urinary (tract) infections: Secondary | ICD-10-CM

## 2016-06-20 DIAGNOSIS — K551 Chronic vascular disorders of intestine: Secondary | ICD-10-CM | POA: Diagnosis present

## 2016-06-20 DIAGNOSIS — E1169 Type 2 diabetes mellitus with other specified complication: Secondary | ICD-10-CM | POA: Diagnosis not present

## 2016-06-20 DIAGNOSIS — R000 Tachycardia, unspecified: Secondary | ICD-10-CM | POA: Diagnosis not present

## 2016-06-20 DIAGNOSIS — K567 Ileus, unspecified: Secondary | ICD-10-CM | POA: Diagnosis not present

## 2016-06-20 DIAGNOSIS — E875 Hyperkalemia: Secondary | ICD-10-CM | POA: Diagnosis not present

## 2016-06-20 DIAGNOSIS — K559 Vascular disorder of intestine, unspecified: Secondary | ICD-10-CM | POA: Diagnosis present

## 2016-06-20 DIAGNOSIS — Z888 Allergy status to other drugs, medicaments and biological substances status: Secondary | ICD-10-CM

## 2016-06-20 DIAGNOSIS — E872 Acidosis: Secondary | ICD-10-CM | POA: Diagnosis not present

## 2016-06-20 DIAGNOSIS — K9189 Other postprocedural complications and disorders of digestive system: Secondary | ICD-10-CM | POA: Diagnosis not present

## 2016-06-20 DIAGNOSIS — J9601 Acute respiratory failure with hypoxia: Secondary | ICD-10-CM

## 2016-06-20 DIAGNOSIS — D72829 Elevated white blood cell count, unspecified: Secondary | ICD-10-CM

## 2016-06-20 DIAGNOSIS — I712 Thoracic aortic aneurysm, without rupture: Secondary | ICD-10-CM | POA: Diagnosis not present

## 2016-06-20 DIAGNOSIS — B952 Enterococcus as the cause of diseases classified elsewhere: Secondary | ICD-10-CM | POA: Diagnosis not present

## 2016-06-20 DIAGNOSIS — Z7982 Long term (current) use of aspirin: Secondary | ICD-10-CM

## 2016-06-20 DIAGNOSIS — I714 Abdominal aortic aneurysm, without rupture, unspecified: Secondary | ICD-10-CM

## 2016-06-20 DIAGNOSIS — J189 Pneumonia, unspecified organism: Secondary | ICD-10-CM

## 2016-06-20 DIAGNOSIS — I71012 Dissection of descending thoracic aorta: Secondary | ICD-10-CM

## 2016-06-20 DIAGNOSIS — I10 Essential (primary) hypertension: Secondary | ICD-10-CM

## 2016-06-20 DIAGNOSIS — I7101 Dissection of thoracic aorta: Secondary | ICD-10-CM | POA: Diagnosis present

## 2016-06-20 DIAGNOSIS — F17210 Nicotine dependence, cigarettes, uncomplicated: Secondary | ICD-10-CM | POA: Diagnosis present

## 2016-06-20 DIAGNOSIS — Z4659 Encounter for fitting and adjustment of other gastrointestinal appliance and device: Secondary | ICD-10-CM

## 2016-06-20 DIAGNOSIS — N280 Ischemia and infarction of kidney: Secondary | ICD-10-CM | POA: Diagnosis not present

## 2016-06-20 DIAGNOSIS — R0600 Dyspnea, unspecified: Secondary | ICD-10-CM

## 2016-06-20 DIAGNOSIS — E1143 Type 2 diabetes mellitus with diabetic autonomic (poly)neuropathy: Secondary | ICD-10-CM | POA: Diagnosis present

## 2016-06-20 DIAGNOSIS — D696 Thrombocytopenia, unspecified: Secondary | ICD-10-CM | POA: Diagnosis present

## 2016-06-20 DIAGNOSIS — E871 Hypo-osmolality and hyponatremia: Secondary | ICD-10-CM

## 2016-06-20 DIAGNOSIS — I71019 Dissection of thoracic aorta, unspecified: Secondary | ICD-10-CM | POA: Diagnosis present

## 2016-06-20 DIAGNOSIS — G8929 Other chronic pain: Secondary | ICD-10-CM | POA: Diagnosis present

## 2016-06-20 DIAGNOSIS — R0602 Shortness of breath: Secondary | ICD-10-CM | POA: Diagnosis not present

## 2016-06-20 DIAGNOSIS — K589 Irritable bowel syndrome without diarrhea: Secondary | ICD-10-CM | POA: Diagnosis present

## 2016-06-20 DIAGNOSIS — K560 Paralytic ileus: Secondary | ICD-10-CM | POA: Diagnosis not present

## 2016-06-20 DIAGNOSIS — Z79899 Other long term (current) drug therapy: Secondary | ICD-10-CM

## 2016-06-20 DIAGNOSIS — F411 Generalized anxiety disorder: Secondary | ICD-10-CM | POA: Diagnosis not present

## 2016-06-20 DIAGNOSIS — Z9049 Acquired absence of other specified parts of digestive tract: Secondary | ICD-10-CM

## 2016-06-20 DIAGNOSIS — R509 Fever, unspecified: Secondary | ICD-10-CM

## 2016-06-20 DIAGNOSIS — Z86010 Personal history of colonic polyps: Secondary | ICD-10-CM

## 2016-06-20 DIAGNOSIS — N390 Urinary tract infection, site not specified: Secondary | ICD-10-CM | POA: Diagnosis not present

## 2016-06-20 DIAGNOSIS — R109 Unspecified abdominal pain: Secondary | ICD-10-CM

## 2016-06-20 DIAGNOSIS — D7589 Other specified diseases of blood and blood-forming organs: Secondary | ICD-10-CM | POA: Diagnosis not present

## 2016-06-20 DIAGNOSIS — J90 Pleural effusion, not elsewhere classified: Secondary | ICD-10-CM

## 2016-06-20 DIAGNOSIS — R110 Nausea: Secondary | ICD-10-CM | POA: Diagnosis not present

## 2016-06-20 DIAGNOSIS — Z885 Allergy status to narcotic agent status: Secondary | ICD-10-CM

## 2016-06-20 DIAGNOSIS — I7100 Dissection of unspecified site of aorta: Secondary | ICD-10-CM

## 2016-06-20 DIAGNOSIS — R140 Abdominal distension (gaseous): Secondary | ICD-10-CM

## 2016-06-20 DIAGNOSIS — J80 Acute respiratory distress syndrome: Secondary | ICD-10-CM

## 2016-06-20 DIAGNOSIS — I200 Unstable angina: Secondary | ICD-10-CM

## 2016-06-20 DIAGNOSIS — K3184 Gastroparesis: Secondary | ICD-10-CM | POA: Diagnosis present

## 2016-06-20 DIAGNOSIS — I7103 Dissection of thoracoabdominal aorta: Secondary | ICD-10-CM | POA: Diagnosis present

## 2016-06-20 DIAGNOSIS — K55059 Acute (reversible) ischemia of intestine, part and extent unspecified: Secondary | ICD-10-CM | POA: Diagnosis not present

## 2016-06-20 DIAGNOSIS — R1114 Bilious vomiting: Secondary | ICD-10-CM

## 2016-06-20 DIAGNOSIS — K7290 Hepatic failure, unspecified without coma: Secondary | ICD-10-CM | POA: Diagnosis present

## 2016-06-20 DIAGNOSIS — M1610 Unilateral primary osteoarthritis, unspecified hip: Secondary | ICD-10-CM | POA: Diagnosis present

## 2016-06-20 DIAGNOSIS — R627 Adult failure to thrive: Secondary | ICD-10-CM | POA: Diagnosis present

## 2016-06-20 DIAGNOSIS — N179 Acute kidney failure, unspecified: Secondary | ICD-10-CM | POA: Diagnosis not present

## 2016-06-20 DIAGNOSIS — R1084 Generalized abdominal pain: Secondary | ICD-10-CM | POA: Diagnosis not present

## 2016-06-20 DIAGNOSIS — Z880 Allergy status to penicillin: Secondary | ICD-10-CM

## 2016-06-20 DIAGNOSIS — F329 Major depressive disorder, single episode, unspecified: Secondary | ICD-10-CM | POA: Diagnosis present

## 2016-06-20 DIAGNOSIS — E278 Other specified disorders of adrenal gland: Secondary | ICD-10-CM | POA: Diagnosis present

## 2016-06-20 DIAGNOSIS — K219 Gastro-esophageal reflux disease without esophagitis: Secondary | ICD-10-CM | POA: Diagnosis present

## 2016-06-20 DIAGNOSIS — I5030 Unspecified diastolic (congestive) heart failure: Secondary | ICD-10-CM | POA: Diagnosis not present

## 2016-06-20 DIAGNOSIS — E669 Obesity, unspecified: Secondary | ICD-10-CM

## 2016-06-20 DIAGNOSIS — F419 Anxiety disorder, unspecified: Secondary | ICD-10-CM | POA: Diagnosis present

## 2016-06-20 DIAGNOSIS — Z8249 Family history of ischemic heart disease and other diseases of the circulatory system: Secondary | ICD-10-CM

## 2016-06-20 DIAGNOSIS — D62 Acute posthemorrhagic anemia: Secondary | ICD-10-CM

## 2016-06-20 DIAGNOSIS — Z7951 Long term (current) use of inhaled steroids: Secondary | ICD-10-CM

## 2016-06-20 DIAGNOSIS — R079 Chest pain, unspecified: Secondary | ICD-10-CM

## 2016-06-20 DIAGNOSIS — Z9689 Presence of other specified functional implants: Secondary | ICD-10-CM

## 2016-06-20 DIAGNOSIS — Z452 Encounter for adjustment and management of vascular access device: Secondary | ICD-10-CM

## 2016-06-20 DIAGNOSIS — Z90710 Acquired absence of both cervix and uterus: Secondary | ICD-10-CM

## 2016-06-20 DIAGNOSIS — D649 Anemia, unspecified: Secondary | ICD-10-CM | POA: Diagnosis present

## 2016-06-20 DIAGNOSIS — R7989 Other specified abnormal findings of blood chemistry: Secondary | ICD-10-CM | POA: Diagnosis present

## 2016-06-20 DIAGNOSIS — I7102 Dissection of abdominal aorta: Secondary | ICD-10-CM | POA: Diagnosis not present

## 2016-06-20 DIAGNOSIS — E876 Hypokalemia: Secondary | ICD-10-CM | POA: Diagnosis not present

## 2016-06-20 DIAGNOSIS — E8770 Fluid overload, unspecified: Secondary | ICD-10-CM | POA: Diagnosis not present

## 2016-06-20 DIAGNOSIS — J9811 Atelectasis: Secondary | ICD-10-CM

## 2016-06-20 HISTORY — DX: Essential (primary) hypertension: I10

## 2016-06-20 HISTORY — DX: Type 2 diabetes mellitus without complications: E11.9

## 2016-06-20 LAB — CBC
HCT: 44.1 % (ref 36.0–46.0)
HEMOGLOBIN: 15.4 g/dL — AB (ref 12.0–15.0)
MCH: 34.7 pg — ABNORMAL HIGH (ref 26.0–34.0)
MCHC: 34.9 g/dL (ref 30.0–36.0)
MCV: 99.3 fL (ref 78.0–100.0)
Platelets: 210 10*3/uL (ref 150–400)
RBC: 4.44 MIL/uL (ref 3.87–5.11)
RDW: 12.5 % (ref 11.5–15.5)
WBC: 19.2 10*3/uL — ABNORMAL HIGH (ref 4.0–10.5)

## 2016-06-20 LAB — HEPATIC FUNCTION PANEL
ALBUMIN: 3.8 g/dL (ref 3.5–5.0)
ALT: 27 U/L (ref 14–54)
AST: 22 U/L (ref 15–41)
Alkaline Phosphatase: 103 U/L (ref 38–126)
BILIRUBIN TOTAL: 0.7 mg/dL (ref 0.3–1.2)
Bilirubin, Direct: 0.1 mg/dL (ref 0.1–0.5)
Indirect Bilirubin: 0.6 mg/dL (ref 0.3–0.9)
TOTAL PROTEIN: 7.4 g/dL (ref 6.5–8.1)

## 2016-06-20 LAB — BASIC METABOLIC PANEL
ANION GAP: 12 (ref 5–15)
Anion gap: 13 (ref 5–15)
BUN: 16 mg/dL (ref 6–20)
BUN: 16 mg/dL (ref 6–20)
CALCIUM: 9 mg/dL (ref 8.9–10.3)
CO2: 19 mmol/L — ABNORMAL LOW (ref 22–32)
CO2: 22 mmol/L (ref 22–32)
Calcium: 8.4 mg/dL — ABNORMAL LOW (ref 8.9–10.3)
Chloride: 108 mmol/L (ref 101–111)
Chloride: 100 mmol/L — ABNORMAL LOW (ref 101–111)
Creatinine, Ser: 0.87 mg/dL (ref 0.44–1.00)
Creatinine, Ser: 1.17 mg/dL — ABNORMAL HIGH (ref 0.44–1.00)
GFR calc Af Amer: 58 mL/min — ABNORMAL LOW (ref >60)
GFR calc non Af Amer: 50 mL/min — ABNORMAL LOW (ref >60)
Glucose, Bld: 195 mg/dL — ABNORMAL HIGH (ref 65–99)
Glucose, Bld: 316 mg/dL — ABNORMAL HIGH (ref 65–99)
Potassium: 3.8 mmol/L (ref 3.5–5.1)
Potassium: 4.3 mmol/L (ref 3.5–5.1)
Sodium: 139 mmol/L (ref 135–145)
Sodium: 135 mmol/L (ref 135–145)

## 2016-06-20 LAB — POCT I-STAT 3, ART BLOOD GAS (G3+)
Acid-base deficit: 11 mmol/L — ABNORMAL HIGH (ref 0.0–2.0)
Acid-base deficit: 1 mmol/L (ref 0.0–2.0)
Bicarbonate: 16.3 mmol/L — ABNORMAL LOW (ref 20.0–28.0)
Bicarbonate: 25.8 mmol/L (ref 20.0–28.0)
O2 Saturation: 87 %
O2 Saturation: 95 %
Patient temperature: 97.6
Patient temperature: 97.3
TCO2: 18 mmol/L (ref 0–100)
TCO2: 27 mmol/L (ref 0–100)
pCO2 arterial: 41 mmHg (ref 32.0–48.0)
pCO2 arterial: 46.9 mmHg (ref 32.0–48.0)
pH, Arterial: 7.204 — ABNORMAL LOW (ref 7.350–7.450)
pH, Arterial: 7.344 — ABNORMAL LOW (ref 7.350–7.450)
pO2, Arterial: 63 mmHg — ABNORMAL LOW (ref 83.0–108.0)
pO2, Arterial: 75 mmHg — ABNORMAL LOW (ref 83.0–108.0)

## 2016-06-20 LAB — URINALYSIS, ROUTINE W REFLEX MICROSCOPIC
Bacteria, UA: NONE SEEN
Bilirubin Urine: NEGATIVE
Glucose, UA: 150 mg/dL — AB
Ketones, ur: NEGATIVE mg/dL
Leukocytes, UA: NEGATIVE
Nitrite: NEGATIVE
Protein, ur: 30 mg/dL — AB
RBC / HPF: NONE SEEN RBC/hpf (ref 0–5)
Specific Gravity, Urine: 1.026 (ref 1.005–1.030)
Squamous Epithelial / LPF: NONE SEEN
pH: 6 (ref 5.0–8.0)

## 2016-06-20 LAB — GLUCOSE, CAPILLARY: Glucose-Capillary: 221 mg/dL — ABNORMAL HIGH (ref 65–99)

## 2016-06-20 LAB — I-STAT TROPONIN, ED: TROPONIN I, POC: 0 ng/mL (ref 0.00–0.08)

## 2016-06-20 LAB — LACTIC ACID, PLASMA
Lactic Acid, Venous: 3.5 mmol/L (ref 0.5–1.9)
Lactic Acid, Venous: 4.6 mmol/L (ref 0.5–1.9)

## 2016-06-20 LAB — I-STAT CG4 LACTIC ACID, ED: Lactic Acid, Venous: 3.09 mmol/L (ref 0.5–1.9)

## 2016-06-20 LAB — MRSA PCR SCREENING: MRSA by PCR: NEGATIVE

## 2016-06-20 LAB — D-DIMER, QUANTITATIVE: D-Dimer, Quant: >20 ug/mL-FEU — ABNORMAL HIGH (ref 0.00–0.50)

## 2016-06-20 LAB — LIPASE, BLOOD: Lipase: 38 U/L (ref 11–51)

## 2016-06-20 MED ORDER — ALBUTEROL SULFATE (2.5 MG/3ML) 0.083% IN NEBU
2.5000 mg | INHALATION_SOLUTION | RESPIRATORY_TRACT | Status: DC
  Administered 2016-06-20 – 2016-06-21 (×5): 2.5 mg via RESPIRATORY_TRACT
  Filled 2016-06-20 (×6): qty 3

## 2016-06-20 MED ORDER — INSULIN GLARGINE 100 UNIT/ML ~~LOC~~ SOLN
15.0000 [IU] | Freq: Every day | SUBCUTANEOUS | Status: DC
  Filled 2016-06-20: qty 0.15

## 2016-06-20 MED ORDER — SODIUM CHLORIDE 0.9 % IV SOLN
INTRAVENOUS | Status: DC
  Administered 2016-06-20 – 2016-06-23 (×4): via INTRAVENOUS

## 2016-06-20 MED ORDER — PANTOPRAZOLE SODIUM 40 MG PO TBEC
40.0000 mg | DELAYED_RELEASE_TABLET | Freq: Every day | ORAL | Status: DC
  Administered 2016-06-20 – 2016-06-21 (×2): 40 mg via ORAL
  Filled 2016-06-20 (×2): qty 1

## 2016-06-20 MED ORDER — INSULIN GLARGINE 100 UNIT/ML ~~LOC~~ SOLN
22.0000 [IU] | Freq: Two times a day (BID) | SUBCUTANEOUS | Status: DC
  Administered 2016-06-20: 22 [IU] via SUBCUTANEOUS
  Filled 2016-06-20 (×2): qty 0.22

## 2016-06-20 MED ORDER — HYDRALAZINE HCL 20 MG/ML IJ SOLN
10.0000 mg | INTRAMUSCULAR | Status: DC | PRN
  Administered 2016-06-20 – 2016-06-26 (×7): 10 mg via INTRAVENOUS
  Filled 2016-06-20 (×8): qty 1

## 2016-06-20 MED ORDER — ACETAMINOPHEN 325 MG PO TABS
650.0000 mg | ORAL_TABLET | Freq: Four times a day (QID) | ORAL | Status: DC | PRN
  Administered 2016-07-10 – 2016-07-23 (×6): 650 mg via ORAL
  Filled 2016-06-20 (×6): qty 2

## 2016-06-20 MED ORDER — HYDROMORPHONE HCL 1 MG/ML IJ SOLN
1.0000 mg | INTRAMUSCULAR | Status: DC | PRN
  Administered 2016-06-20 – 2016-06-21 (×6): 1 mg via INTRAVENOUS
  Filled 2016-06-20 (×6): qty 1

## 2016-06-20 MED ORDER — ADULT MULTIVITAMIN W/MINERALS CH
1.0000 | ORAL_TABLET | Freq: Every day | ORAL | Status: DC
  Administered 2016-06-21: 1 via ORAL
  Filled 2016-06-20: qty 1

## 2016-06-20 MED ORDER — ONDANSETRON HCL 4 MG/2ML IJ SOLN
4.0000 mg | Freq: Once | INTRAMUSCULAR | Status: AC
  Administered 2016-06-20: 4 mg via INTRAVENOUS
  Filled 2016-06-20: qty 2

## 2016-06-20 MED ORDER — FUROSEMIDE 10 MG/ML IJ SOLN
20.0000 mg | Freq: Once | INTRAMUSCULAR | Status: AC
  Administered 2016-06-20: 20 mg via INTRAVENOUS

## 2016-06-20 MED ORDER — MOMETASONE FURO-FORMOTEROL FUM 200-5 MCG/ACT IN AERO
2.0000 | INHALATION_SPRAY | Freq: Two times a day (BID) | RESPIRATORY_TRACT | Status: DC
  Administered 2016-06-21 – 2016-06-27 (×13): 2 via RESPIRATORY_TRACT
  Filled 2016-06-20: qty 8.8

## 2016-06-20 MED ORDER — INSULIN ASPART 100 UNIT/ML ~~LOC~~ SOLN
0.0000 [IU] | SUBCUTANEOUS | Status: DC
  Administered 2016-06-20: 8 [IU] via SUBCUTANEOUS
  Administered 2016-06-21: 12 [IU] via SUBCUTANEOUS
  Administered 2016-06-21 (×3): 8 [IU] via SUBCUTANEOUS
  Administered 2016-06-21: 16 [IU] via SUBCUTANEOUS
  Administered 2016-06-21: 8 [IU] via SUBCUTANEOUS
  Administered 2016-06-21: 12 [IU] via SUBCUTANEOUS
  Administered 2016-06-22: 4 [IU] via SUBCUTANEOUS
  Administered 2016-06-22 (×3): 2 [IU] via SUBCUTANEOUS
  Administered 2016-06-22 – 2016-06-23 (×2): 4 [IU] via SUBCUTANEOUS
  Administered 2016-06-23 (×3): 2 [IU] via SUBCUTANEOUS
  Administered 2016-06-24 (×4): 4 [IU] via SUBCUTANEOUS
  Administered 2016-06-24: 2 [IU] via SUBCUTANEOUS
  Administered 2016-06-24 – 2016-06-25 (×4): 4 [IU] via SUBCUTANEOUS
  Administered 2016-06-25: 2 [IU] via SUBCUTANEOUS
  Administered 2016-06-25 – 2016-06-26 (×4): 4 [IU] via SUBCUTANEOUS
  Administered 2016-06-26: 2 [IU] via SUBCUTANEOUS
  Administered 2016-06-26 (×2): 4 [IU] via SUBCUTANEOUS
  Administered 2016-06-26: 8 [IU] via SUBCUTANEOUS
  Administered 2016-06-27 (×3): 2 [IU] via SUBCUTANEOUS
  Administered 2016-06-27: 4 [IU] via SUBCUTANEOUS
  Administered 2016-06-27: 2 [IU] via SUBCUTANEOUS
  Administered 2016-06-28: 8 [IU] via SUBCUTANEOUS
  Administered 2016-06-28: 2 [IU] via SUBCUTANEOUS
  Administered 2016-06-28 (×3): 4 [IU] via SUBCUTANEOUS
  Administered 2016-06-28: 8 [IU] via SUBCUTANEOUS
  Administered 2016-06-29 (×2): 4 [IU] via SUBCUTANEOUS
  Administered 2016-06-29: 2 [IU] via SUBCUTANEOUS
  Administered 2016-06-29 (×2): 4 [IU] via SUBCUTANEOUS
  Administered 2016-06-29: 2 [IU] via SUBCUTANEOUS
  Administered 2016-06-30: 4 [IU] via SUBCUTANEOUS
  Administered 2016-06-30: 2 [IU] via SUBCUTANEOUS
  Administered 2016-06-30 – 2016-07-01 (×6): 4 [IU] via SUBCUTANEOUS
  Administered 2016-07-01: 2 [IU] via SUBCUTANEOUS
  Administered 2016-07-01: 4 [IU] via SUBCUTANEOUS
  Administered 2016-07-01: 8 [IU] via SUBCUTANEOUS
  Administered 2016-07-01 – 2016-07-02 (×5): 4 [IU] via SUBCUTANEOUS
  Administered 2016-07-02: 8 [IU] via SUBCUTANEOUS
  Administered 2016-07-02: 4 [IU] via SUBCUTANEOUS
  Administered 2016-07-03 (×5): 2 [IU] via SUBCUTANEOUS
  Administered 2016-07-03: 8 [IU] via SUBCUTANEOUS
  Administered 2016-07-04 (×3): 2 [IU] via SUBCUTANEOUS
  Administered 2016-07-04: 4 [IU] via SUBCUTANEOUS
  Administered 2016-07-04: 8 [IU] via SUBCUTANEOUS
  Administered 2016-07-05: 2 [IU] via SUBCUTANEOUS
  Administered 2016-07-05: 8 [IU] via SUBCUTANEOUS
  Administered 2016-07-05: 2 [IU] via SUBCUTANEOUS
  Administered 2016-07-05: 4 [IU] via SUBCUTANEOUS
  Administered 2016-07-05: 8 [IU] via SUBCUTANEOUS
  Administered 2016-07-05 – 2016-07-06 (×2): 4 [IU] via SUBCUTANEOUS
  Administered 2016-07-06: 2 [IU] via SUBCUTANEOUS
  Administered 2016-07-06: 4 [IU] via SUBCUTANEOUS
  Administered 2016-07-06: 8 [IU] via SUBCUTANEOUS
  Administered 2016-07-06: 4 [IU] via SUBCUTANEOUS
  Administered 2016-07-07: 12 [IU] via SUBCUTANEOUS
  Administered 2016-07-07: 2 [IU] via SUBCUTANEOUS
  Administered 2016-07-07: 4 [IU] via SUBCUTANEOUS
  Administered 2016-07-07 – 2016-07-08 (×2): 2 [IU] via SUBCUTANEOUS
  Administered 2016-07-08: 4 [IU] via SUBCUTANEOUS
  Administered 2016-07-08 (×4): 2 [IU] via SUBCUTANEOUS
  Administered 2016-07-09: 4 [IU] via SUBCUTANEOUS
  Administered 2016-07-09 – 2016-07-10 (×5): 2 [IU] via SUBCUTANEOUS
  Administered 2016-07-10 (×4): 4 [IU] via SUBCUTANEOUS
  Administered 2016-07-11 (×2): 2 [IU] via SUBCUTANEOUS
  Administered 2016-07-11: 4 [IU] via SUBCUTANEOUS
  Administered 2016-07-11 – 2016-07-12 (×7): 2 [IU] via SUBCUTANEOUS
  Administered 2016-07-12 (×2): 4 [IU] via SUBCUTANEOUS
  Administered 2016-07-13 (×4): 2 [IU] via SUBCUTANEOUS
  Administered 2016-07-13: 4 [IU] via SUBCUTANEOUS
  Administered 2016-07-13 – 2016-07-14 (×3): 2 [IU] via SUBCUTANEOUS
  Administered 2016-07-14: 4 [IU] via SUBCUTANEOUS
  Administered 2016-07-14: 2 [IU] via SUBCUTANEOUS
  Administered 2016-07-14 – 2016-07-15 (×2): 4 [IU] via SUBCUTANEOUS
  Administered 2016-07-15: 2 [IU] via SUBCUTANEOUS
  Administered 2016-07-15: 4 [IU] via SUBCUTANEOUS
  Administered 2016-07-15 (×3): 2 [IU] via SUBCUTANEOUS
  Administered 2016-07-16 (×2): 4 [IU] via SUBCUTANEOUS
  Administered 2016-07-16 (×2): 2 [IU] via SUBCUTANEOUS
  Administered 2016-07-16: 8 [IU] via SUBCUTANEOUS
  Administered 2016-07-17: 2 [IU] via SUBCUTANEOUS
  Administered 2016-07-17: 3 [IU] via SUBCUTANEOUS
  Administered 2016-07-17: 2 [IU] via SUBCUTANEOUS
  Administered 2016-07-18: 4 [IU] via SUBCUTANEOUS
  Administered 2016-07-18: 2 [IU] via SUBCUTANEOUS
  Administered 2016-07-18: 4 [IU] via SUBCUTANEOUS
  Administered 2016-07-18: 2 [IU] via SUBCUTANEOUS
  Administered 2016-07-19: 4 [IU] via SUBCUTANEOUS
  Administered 2016-07-19: 12 [IU] via SUBCUTANEOUS
  Administered 2016-07-19: 8 [IU] via SUBCUTANEOUS
  Administered 2016-07-19: 4 [IU] via SUBCUTANEOUS
  Administered 2016-07-19: 8 [IU] via SUBCUTANEOUS
  Administered 2016-07-20 (×5): 12 [IU] via SUBCUTANEOUS
  Administered 2016-07-20 (×2): 8 [IU] via SUBCUTANEOUS
  Administered 2016-07-21 (×4): 4 [IU] via SUBCUTANEOUS
  Administered 2016-07-21: 8 [IU] via SUBCUTANEOUS
  Administered 2016-07-22 (×2): 2 [IU] via SUBCUTANEOUS
  Administered 2016-07-22: 4 [IU] via SUBCUTANEOUS
  Administered 2016-07-22 – 2016-07-23 (×6): 2 [IU] via SUBCUTANEOUS
  Administered 2016-07-23: 4 [IU] via SUBCUTANEOUS
  Administered 2016-07-23: 2 [IU] via SUBCUTANEOUS
  Administered 2016-07-24: 8 [IU] via SUBCUTANEOUS
  Administered 2016-07-24 – 2016-07-25 (×8): 4 [IU] via SUBCUTANEOUS
  Administered 2016-07-25: 8 [IU] via SUBCUTANEOUS
  Administered 2016-07-26: 4 [IU] via SUBCUTANEOUS
  Administered 2016-07-26: 8 [IU] via SUBCUTANEOUS

## 2016-06-20 MED ORDER — GUAIFENESIN ER 600 MG PO TB12
600.0000 mg | ORAL_TABLET | Freq: Two times a day (BID) | ORAL | Status: DC
  Administered 2016-06-21: 600 mg via ORAL
  Filled 2016-06-20: qty 1

## 2016-06-20 MED ORDER — HYDROMORPHONE HCL 1 MG/ML IJ SOLN
1.0000 mg | Freq: Once | INTRAMUSCULAR | Status: AC
  Administered 2016-06-20: 1 mg via INTRAVENOUS
  Filled 2016-06-20: qty 1

## 2016-06-20 MED ORDER — ALBUTEROL SULFATE (2.5 MG/3ML) 0.083% IN NEBU: 2.5000 mg | INHALATION_SOLUTION | Freq: Four times a day (QID) | RESPIRATORY_TRACT | Status: DC | PRN

## 2016-06-20 MED ORDER — ESMOLOL HCL-SODIUM CHLORIDE 2000 MG/100ML IV SOLN
25.0000 ug/kg/min | Freq: Once | INTRAVENOUS | Status: AC
  Administered 2016-06-20: 300 ug/kg/min via INTRAVENOUS
  Filled 2016-06-20: qty 100

## 2016-06-20 MED ORDER — CLONIDINE HCL 0.2 MG/24HR TD PTWK
0.2000 mg | MEDICATED_PATCH | TRANSDERMAL | Status: DC
  Administered 2016-06-20 – 2016-06-27 (×2): 0.2 mg via TRANSDERMAL
  Filled 2016-06-20 (×2): qty 1

## 2016-06-20 MED ORDER — SODIUM CHLORIDE 0.9 % IV SOLN
INTRAVENOUS | Status: DC | PRN
  Administered 2016-08-06: 19:00:00 via INTRA_ARTERIAL

## 2016-06-20 MED ORDER — METOPROLOL TARTRATE 50 MG PO TABS
50.0000 mg | ORAL_TABLET | Freq: Two times a day (BID) | ORAL | Status: DC
  Administered 2016-06-20 – 2016-06-21 (×3): 50 mg via ORAL
  Filled 2016-06-20 (×3): qty 1

## 2016-06-20 MED ORDER — SORBITOL 70 % SOLN: 30.0000 mL | Freq: Every day | Status: DC | PRN

## 2016-06-20 MED ORDER — DEXTROSE-NACL 5-0.45 % IV SOLN
INTRAVENOUS | Status: DC
  Administered 2016-06-20 – 2016-06-21 (×2): via INTRAVENOUS

## 2016-06-20 MED ORDER — NITROPRUSSIDE SODIUM 25 MG/ML IV SOLN
0.0000 ug/kg/min | INTRAVENOUS | Status: DC
  Administered 2016-06-20: 2 ug/kg/min via INTRAVENOUS
  Administered 2016-06-20: 0.3 ug/kg/min via INTRAVENOUS
  Administered 2016-06-21: 2 ug/kg/min via INTRAVENOUS
  Administered 2016-06-21: 0.8 ug/kg/min via INTRAVENOUS
  Administered 2016-06-21 (×2): 2 ug/kg/min via INTRAVENOUS
  Administered 2016-06-21: 0.9 ug/kg/min via INTRAVENOUS
  Administered 2016-06-22: 1.1 ug/kg/min via INTRAVENOUS
  Filled 2016-06-20 (×7): qty 2

## 2016-06-20 MED ORDER — ESMOLOL HCL-SODIUM CHLORIDE 2000 MG/100ML IV SOLN
25.0000 ug/kg/min | Freq: Once | INTRAVENOUS | Status: AC
  Administered 2016-06-20: 25 ug/kg/min via INTRAVENOUS
  Filled 2016-06-20: qty 100

## 2016-06-20 MED ORDER — METOPROLOL TARTRATE 5 MG/5ML IV SOLN: 5.0000 mg | INTRAVENOUS | Status: DC

## 2016-06-20 MED ORDER — PROMETHAZINE HCL 25 MG/ML IJ SOLN
6.2500 mg | Freq: Four times a day (QID) | INTRAMUSCULAR | Status: DC | PRN
  Administered 2016-06-21 – 2016-07-06 (×10): 6.25 mg via INTRAVENOUS
  Filled 2016-06-20 (×11): qty 1

## 2016-06-20 MED ORDER — NICOTINE 14 MG/24HR TD PT24
14.0000 mg | MEDICATED_PATCH | Freq: Every day | TRANSDERMAL | Status: DC
  Administered 2016-06-20 – 2016-06-27 (×8): 14 mg via TRANSDERMAL
  Filled 2016-06-20 (×8): qty 1

## 2016-06-20 MED ORDER — ALPRAZOLAM 0.5 MG PO TABS
0.5000 mg | ORAL_TABLET | Freq: Three times a day (TID) | ORAL | Status: DC | PRN
  Administered 2016-06-21 (×2): 0.5 mg via ORAL
  Filled 2016-06-20 (×2): qty 1

## 2016-06-20 MED ORDER — ORAL CARE MOUTH RINSE
15.0000 mL | Freq: Two times a day (BID) | OROMUCOSAL | Status: DC
  Administered 2016-06-21 – 2016-06-22 (×4): 15 mL via OROMUCOSAL

## 2016-06-20 MED ORDER — METOPROLOL TARTRATE 5 MG/5ML IV SOLN: 5.0000 mg | INTRAVENOUS | Status: DC | PRN

## 2016-06-20 MED ORDER — CHLORHEXIDINE GLUCONATE 0.12 % MT SOLN
15.0000 mL | Freq: Two times a day (BID) | OROMUCOSAL | Status: DC
  Administered 2016-06-20 – 2016-06-22 (×4): 15 mL via OROMUCOSAL
  Filled 2016-06-20 (×2): qty 15

## 2016-06-20 MED ORDER — ONDANSETRON HCL 4 MG/2ML IJ SOLN
4.0000 mg | Freq: Four times a day (QID) | INTRAMUSCULAR | Status: DC | PRN
  Administered 2016-06-20 – 2016-07-18 (×39): 4 mg via INTRAVENOUS
  Filled 2016-06-20 (×41): qty 2

## 2016-06-20 MED ORDER — TRAMADOL HCL 50 MG PO TABS
50.0000 mg | ORAL_TABLET | Freq: Four times a day (QID) | ORAL | Status: DC | PRN
Start: 2016-06-20 — End: 2016-06-22

## 2016-06-20 MED ORDER — PNEUMOCOCCAL VAC POLYVALENT 25 MCG/0.5ML IJ INJ: 0.5000 mL | INJECTION | INTRAMUSCULAR | Status: DC | PRN

## 2016-06-20 MED ORDER — PROMETHAZINE HCL 25 MG/ML IJ SOLN
12.5000 mg | Freq: Four times a day (QID) | INTRAMUSCULAR | Status: DC | PRN
  Administered 2016-06-20: 12.5 mg via INTRAVENOUS
  Filled 2016-06-20: qty 1

## 2016-06-20 MED ORDER — IOPAMIDOL (ISOVUE-370) INJECTION 76%
100.0000 mL | Freq: Once | INTRAVENOUS | Status: AC | PRN
  Administered 2016-06-20: 100 mL via INTRAVENOUS

## 2016-06-20 MED ORDER — SODIUM CHLORIDE 0.9 % IV BOLUS (SEPSIS)
500.0000 mL | Freq: Once | INTRAVENOUS | Status: AC
  Administered 2016-06-20: 500 mL via INTRAVENOUS

## 2016-06-20 MED ORDER — SODIUM BICARBONATE 8.4 % IV SOLN
INTRAVENOUS | Status: AC
  Filled 2016-06-20: qty 100

## 2016-06-20 MED ORDER — SODIUM BICARBONATE 8.4 % IV SOLN
100.0000 meq | Freq: Once | INTRAVENOUS | Status: AC
  Administered 2016-06-20: 100 meq via INTRAVENOUS

## 2016-06-20 MED ORDER — ESMOLOL HCL-SODIUM CHLORIDE 2000 MG/100ML IV SOLN
50.0000 ug/kg/min | INTRAVENOUS | Status: DC
  Administered 2016-06-20 (×4): 200 ug/kg/min via INTRAVENOUS
  Administered 2016-06-21: 75 ug/kg/min via INTRAVENOUS
  Administered 2016-06-21: 100 ug/kg/min via INTRAVENOUS
  Administered 2016-06-21: 150 ug/kg/min via INTRAVENOUS
  Administered 2016-06-22: 50 ug/kg/min via INTRAVENOUS
  Administered 2016-06-22: 30 ug/kg/min via INTRAVENOUS
  Administered 2016-06-22: 50 ug/kg/min via INTRAVENOUS
  Administered 2016-06-23: 100 ug/kg/min via INTRAVENOUS
  Administered 2016-06-24 (×2): 150 ug/kg/min via INTRAVENOUS
  Administered 2016-06-24: 25 ug/kg/min via INTRAVENOUS
  Administered 2016-06-25: 225 ug/kg/min via INTRAVENOUS
  Administered 2016-06-25 (×2): 200 ug/kg/min via INTRAVENOUS
  Administered 2016-06-25: 175 ug/kg/min via INTRAVENOUS
  Administered 2016-06-25 (×3): 225 ug/kg/min via INTRAVENOUS
  Administered 2016-06-25: 200 ug/kg/min via INTRAVENOUS
  Administered 2016-06-25: 250 ug/kg/min via INTRAVENOUS
  Administered 2016-06-25 (×2): 100 ug/kg/min via INTRAVENOUS
  Administered 2016-06-25: 150 ug/kg/min via INTRAVENOUS
  Administered 2016-06-25: 225 ug/kg/min via INTRAVENOUS
  Administered 2016-06-26 (×3): 200 ug/kg/min via INTRAVENOUS
  Administered 2016-06-26: 250 ug/kg/min via INTRAVENOUS
  Administered 2016-06-26: 200 ug/kg/min via INTRAVENOUS
  Administered 2016-06-26: 300 ug/kg/min via INTRAVENOUS
  Administered 2016-06-26: 125 ug/kg/min via INTRAVENOUS
  Administered 2016-06-26: 200 ug/kg/min via INTRAVENOUS
  Administered 2016-06-26: 250 ug/kg/min via INTRAVENOUS
  Administered 2016-06-27 (×2): 270 ug/kg/min via INTRAVENOUS
  Administered 2016-06-27: 280 ug/kg/min via INTRAVENOUS
  Administered 2016-06-27: 270 ug/kg/min via INTRAVENOUS
  Administered 2016-06-27: 265 ug/kg/min via INTRAVENOUS
  Administered 2016-06-27: 270 ug/kg/min via INTRAVENOUS
  Administered 2016-06-27: 300 ug/kg/min via INTRAVENOUS
  Administered 2016-06-27 (×2): 280 ug/kg/min via INTRAVENOUS
  Administered 2016-06-27: 200 ug/kg/min via INTRAVENOUS
  Administered 2016-06-27: 265 ug/kg/min via INTRAVENOUS
  Administered 2016-06-27: 300 ug/kg/min via INTRAVENOUS
  Administered 2016-06-27: 265 ug/kg/min via INTRAVENOUS
  Administered 2016-06-27: 250 ug/kg/min via INTRAVENOUS
  Administered 2016-06-28: 275 ug/kg/min via INTRAVENOUS
  Administered 2016-06-28: 225 ug/kg/min via INTRAVENOUS
  Administered 2016-06-28: 275 ug/kg/min via INTRAVENOUS
  Administered 2016-06-28: 225 ug/kg/min via INTRAVENOUS
  Administered 2016-06-28: 275 ug/kg/min via INTRAVENOUS
  Administered 2016-06-28 (×4): 300 ug/kg/min via INTRAVENOUS
  Administered 2016-06-28: 225 ug/kg/min via INTRAVENOUS
  Administered 2016-06-28: 275 ug/kg/min via INTRAVENOUS
  Administered 2016-06-28: 270 ug/kg/min via INTRAVENOUS
  Administered 2016-06-28: 300 ug/kg/min via INTRAVENOUS
  Administered 2016-06-28: 225 ug/kg/min via INTRAVENOUS
  Administered 2016-06-28: 275 ug/kg/min via INTRAVENOUS
  Administered 2016-06-28: 300 ug/kg/min via INTRAVENOUS
  Administered 2016-06-29: 250 ug/kg/min via INTRAVENOUS
  Administered 2016-06-29: 275 ug/kg/min via INTRAVENOUS
  Administered 2016-06-29: 225 ug/kg/min via INTRAVENOUS
  Administered 2016-06-29: 300 ug/kg/min via INTRAVENOUS
  Administered 2016-06-29: 250 ug/kg/min via INTRAVENOUS
  Administered 2016-06-29: 275 ug/kg/min via INTRAVENOUS
  Administered 2016-06-29 (×2): 250 ug/kg/min via INTRAVENOUS
  Administered 2016-06-29: 275 ug/kg/min via INTRAVENOUS
  Administered 2016-06-29: 300 ug/kg/min via INTRAVENOUS
  Administered 2016-06-29: 250 ug/kg/min via INTRAVENOUS
  Administered 2016-06-29: 200 ug/kg/min via INTRAVENOUS
  Administered 2016-06-29: 275 ug/kg/min via INTRAVENOUS
  Administered 2016-06-30: 300 ug/kg/min via INTRAVENOUS
  Administered 2016-06-30: 200 ug/kg/min via INTRAVENOUS
  Administered 2016-06-30: 240 ug/kg/min via INTRAVENOUS
  Administered 2016-06-30 (×2): 200 ug/kg/min via INTRAVENOUS
  Administered 2016-06-30: 300 ug/kg/min via INTRAVENOUS
  Filled 2016-06-20 (×90): qty 100

## 2016-06-20 MED ORDER — FUROSEMIDE 10 MG/ML IJ SOLN
INTRAMUSCULAR | Status: AC
  Filled 2016-06-20: qty 2

## 2016-06-20 MED ORDER — CITALOPRAM HYDROBROMIDE 10 MG/5ML PO SOLN
20.0000 mg | Freq: Every day | ORAL | Status: DC
  Filled 2016-06-20 (×2): qty 10

## 2016-06-20 MED ORDER — ESMOLOL HCL-SODIUM CHLORIDE 2000 MG/100ML IV SOLN
INTRAVENOUS | Status: AC
  Filled 2016-06-20: qty 100

## 2016-06-20 MED ORDER — ESMOLOL BOLUS VIA INFUSION
500.0000 ug/kg | Freq: Once | INTRAVENOUS | Status: AC
  Administered 2016-06-20: 13:00:00 via INTRAVENOUS
  Administered 2016-06-20: 40800 ug via INTRAVENOUS
  Filled 2016-06-20: qty 41000

## 2016-06-20 MED ORDER — ACETAMINOPHEN 650 MG RE SUPP
650.0000 mg | Freq: Four times a day (QID) | RECTAL | Status: DC | PRN
  Filled 2016-06-20: qty 1

## 2016-06-20 MED ORDER — ROSUVASTATIN CALCIUM 10 MG PO TABS: 10.0000 mg | ORAL_TABLET | Freq: Every day | ORAL | Status: DC

## 2016-06-20 NOTE — ED Triage Notes (Signed)
Pt brought in by [facility] with complaints of sudden onset of sharp substernal chest pain that radiates to the back. Pt took 324mg  of Aspirin prior to EMS arrival. Pt also c/o nausea, dizziness, SOB.  Pt was given Zofran 4mg  IV on EMS transport. Pt's BP initially 204/96 for EMS, pt was given 1 Nitro with no relief of chest pain. BP decreased to 126/76 after Nitro for EMS. ST on the monitor, HR 104 for EMS.

## 2016-06-20 NOTE — ED Provider Notes (Signed)
[facility] DEPT Provider Note   CSN: [id number] Arrival date & time: 06/20/16  9518     History   Chief Complaint Chief Complaint  Patient presents with  . Chest Pain    HPI Haley Graham is a 61 y.o. female.  Patient with acute onset of chest pain radiating to the back and abdominal pain radiating to the back. Patient's had difficulty with abdominal pain on and off for several weeks. Recently had an ultrasound of her abdomen and colonoscopy without any acute findings. But the symptoms got very severe this morning at 7 in the morning.  Pain and the pain rating the back is very severe. Associated with nausea and vomiting and some shortness of breath. No known history of any cardiac problems. Patient does have a history of diabetes and hypertension.      Past Medical History:  Diagnosis Date  . Anxiety   . Arthritis   . Diabetes mellitus without complication ([place])   . Gastroparesis   . GERD (gastroesophageal reflux disease)   . Hypertension   . IBS (irritable bowel syndrome)     Patient Active Problem List   Diagnosis Date Noted  . Change in stool 05/28/2016  . Gastroparesis 11/19/2011  . Nausea 11/19/2011  . Dysphagia 11/11/2011  . GERD (gastroesophageal reflux disease) 11/11/2011    Past Surgical History:  Procedure Laterality Date  . ABDOMINAL HYSTERECTOMY    . CESAREAN SECTION    . CHOLECYSTECTOMY    . COLONOSCOPY    . COLONOSCOPY N/A 06/10/2016   Procedure: COLONOSCOPY;  Surgeon: Rogene Houston, MD;  Location: [facility];  Service: Endoscopy;  Laterality: N/A;  8:30  . POLYPECTOMY  06/10/2016   Procedure: POLYPECTOMY;  Surgeon: Rogene Houston, MD;  Location: [facility];  Service: Endoscopy;;  multiple colon  . UPPER GASTROINTESTINAL ENDOSCOPY      OB History    No data available       Home Medications    Prior to Admission medications   Medication Sig Start Date End Date Taking? Authorizing Provider  aspirin EC 81 MG tablet Take 324  mg by mouth once.    Yes [provider]  albuterol (VENTOLIN HFA) 108 (90 Base) MCG/ACT inhaler Inhale 2 puffs into the lungs every 6 (six) hours as needed for wheezing or shortness of breath.     [provider]  ALPRAZolam Duanne Moron) 0.5 MG tablet Take 0.5 mg by mouth 3 (three) times daily as needed for anxiety.     [provider]  citalopram (CELEXA) 40 MG tablet Take 40 mg by mouth at bedtime.     [provider]  dicyclomine (BENTYL) 10 MG capsule Take 1 capsule (10 mg total) by mouth 2 (two) times daily before a meal. 06/10/16   Rehman, Mechele Dawley, MD  fluticasone (FLONASE) 50 MCG/ACT nasal spray Place 2 sprays into both nostrils daily. 05/17/16   [provider]  metoprolol succinate (TOPROL-XL) 25 MG 24 hr tablet Take 25 mg by mouth daily.    [provider]  ondansetron (ZOFRAN) 4 MG tablet Take 4 mg by mouth 3 (three) times daily as needed for nausea. 05/22/16   [provider]  pantoprazole (PROTONIX) 40 MG tablet Take 40 mg by mouth daily.  11/12/11   Rehman, Mechele Dawley, MD  rosuvastatin (CRESTOR) 10 MG tablet Take 10 mg by mouth daily.    [provider]    Family History No family history on file.  Social  History Social History  Substance Use Topics  . Smoking status: Current Every Day Smoker    Packs/day: 0.75    Years: 23.00    Types: Cigarettes  . Smokeless tobacco: Never Used     Comment: 5-6 cigarettes. Cut back about 3 weeks ago. Smoking since age 17.  Marland Kitchen Alcohol use No     Allergies   Codeine; Reglan [metoclopramide]; and Penicillins   Review of Systems Review of Systems  Constitutional: Positive for diaphoresis.  HENT: Negative for congestion.   Respiratory: Positive for shortness of breath.   Cardiovascular: Positive for chest pain. Negative for leg swelling.  Gastrointestinal: Positive for abdominal pain, nausea and vomiting.  Genitourinary: Negative for dysuria.  Musculoskeletal: Positive  for back pain.  Skin: Negative for rash.  Neurological: Negative for headaches.  Hematological: Does not bruise/bleed easily.  Psychiatric/Behavioral: Negative for confusion.     Physical Exam Updated Vital Signs BP (!) 204/97   Pulse 88   Resp 12   Ht 5\' 3"  (1.6 m)   Wt 180 lb (81.6 kg)   SpO2 96%   BMI 31.89 kg/m   Physical Exam  Constitutional: She is oriented to person, place, and time. She appears well-developed and well-nourished. She appears distressed.  HENT:  Head: Normocephalic.  Mouth/Throat: Oropharynx is clear and moist.  Eyes: Conjunctivae and EOM are normal. Pupils are equal, round, and reactive to light.  Neck: Normal range of motion. Neck supple.  Cardiovascular: Normal rate and regular rhythm.   Pulmonary/Chest: Effort normal and breath sounds normal. No respiratory distress.  Abdominal: Soft. Bowel sounds are normal. She exhibits distension. There is no tenderness.  Musculoskeletal: Normal range of motion. She exhibits no edema.  Dorsalis pedis pulse 1-2+ both feet. Cap refill 2 seconds or less. Some mottling to the left foot with a low bit of blooming of the toes. But as stated the dorsalis pedis pulse was very strong. No pain in the feet.  Neurological: She is alert and oriented to person, place, and time. No cranial nerve deficit or sensory deficit. She exhibits normal muscle tone. Coordination normal.  Skin: Skin is warm. No rash noted.  Nursing note and vitals reviewed.    ED Treatments / Results  Labs (all labs ordered are listed, but only abnormal results are displayed) Labs Reviewed  BASIC METABOLIC PANEL - Abnormal; Notable for the following:       Result Value   CO2 19 (*)    Glucose, Bld 195 (*)    All other components within normal limits  CBC - Abnormal; Notable for the following:    WBC 19.2 (*)    Hemoglobin 15.4 (*)    MCH 34.7 (*)    All other components within normal limits  D-DIMER, QUANTITATIVE (NOT AT [facility]) - Abnormal; Notable  for the following:    D-Dimer, Quant >20.00 (*)    All other components within normal limits  I-STAT CG4 LACTIC ACID, ED - Abnormal; Notable for the following:    Lactic Acid, Venous 3.09 (*)    All other components within normal limits  HEPATIC FUNCTION PANEL  LIPASE, BLOOD  I-STAT TROPOININ, ED    EKG  EKG Interpretation  Date/Time:  Saturday Jun 20 2016 07:36:36 EDT Ventricular Rate:  56 PR Interval:    QRS Duration: 92 QT Interval:  442 QTC Calculation: 427 R Axis:   46 Text Interpretation:  Sinus rhythm Nonspecific T abnormalities, lateral leads No previous ECGs available Confirmed by Fredia Sorrow [phone]) on  06/20/2016 7:41:41 AM       Radiology Dg Chest Port 1 View  Result Date: 06/20/2016 CLINICAL DATA:  Sudden onset sharp substernal chest pain that radiates to the back. Nausea, dizziness and shortness of breath. EXAM: PORTABLE CHEST 1 VIEW COMPARISON:  None. FINDINGS: Trachea is midline. Heart size is accentuated by AP supine technique. Probable mild scarring in the right middle lobe and lingula. Lungs are otherwise clear. No pleural fluid. IMPRESSION: No acute findings. Electronically Signed   By: Lorin Picket M.D.   On: 06/20/2016 09:56   Ct Angio Chest/abd/pel For Dissection W And/or W/wo  Result Date: 06/20/2016 CLINICAL DATA:  Substernal chest pain radiating to back EXAM: CT ANGIOGRAPHY CHEST, ABDOMEN AND PELVIS TECHNIQUE: Multidetector CT imaging through the chest, abdomen and pelvis was performed using the standard protocol during bolus administration of intravenous contrast. Multiplanar reconstructed images and MIPs were obtained and reviewed to evaluate the vascular anatomy. CONTRAST:  100 cc Isovue 370 IV COMPARISON:  CT abdomen and pelvis 10/27/2011 FINDINGS: CTA CHEST FINDINGS Cardiovascular: There is the type B aortic dissection beginning in the distal aortic arch just beyond the origin of the great vessels. The true lumen is compressed by the false lumen.  No aneurysm. No pulmonary embolus. Heart is normal size. Mediastinum/Nodes: No mediastinal, hilar, or axillary adenopathy. Lungs/Pleura: Atelectasis or scarring in the lingula. Lungs otherwise clear. No effusions. Musculoskeletal: No acute bony abnormality. Review of the MIP images confirms the above findings. CTA ABDOMEN AND PELVIS FINDINGS VASCULAR Aorta: Dissection continues throughout the abdominal aorta with the celiac artery and superior mesenteric artery arising from the true lumen anteriorly. The dissection may extend into the left renal artery where there is only a small amount of blood flow noted in the proximal and mid left renal artery. Right renal artery appears to arise from the false lumen and is patent. The the true lumen appears thrombosed below the origin of the superior mesenteric artery. Inferior mesenteric artery arises from the thrombosed true lumen and appears occluded proximally, reconstitutes several cm from the origin. Celiac: Patent. SMA: Patent. Renals: As above. IMA: As above. Inflow: To the dissection continues into both common iliac arteries with thrombosed true lumens. The dissection appears to terminate at approximately the level of the common iliac bifurcation bilaterally. Veins: Grossly patent and unremarkable. Review of the MIP images confirms the above findings. NON-VASCULAR Hepatobiliary: Mild diffuse fatty infiltration. Prior cholecystectomy. Pancreas: No focal abnormality or ductal dilatation. Spleen: No focal abnormality.  Normal size. Adrenals/Urinary Tract: Nodules in the left adrenal gland are low-density on the precontrast imaging compatible with small adenomas. There are areas of non perfusion noted in the mid and lower poles of the left kidney compatible with infarction, likely related to the involvement of the left renal artery by dissection. Urinary bladder unremarkable. Stomach/Bowel: Stomach, large and small bowel grossly unremarkable. Lymphatic: No adenopathy.  Reproductive: Prior hysterectomy.  No adnexal masses. Other: No free fluid or free air. Musculoskeletal: No acute bony abnormality. Review of the MIP images confirms the above findings. 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 This involves the descending thoracic aorta, abdominal aorta and iliac vessels. The true lumen is compressed by the larger false lumen, and is thrombosed below the SMA/renal artery origins. The dissection appears to extend into the left renal artery with partial occlusion and areas of infarct in the mid and lower pole of the left kidney. Fatty infiltration of the liver.  Critical Value/emergent results were called by telephone at the time of interpretation on 06/20/2016 at 10:13 am to Dr. Fredia Sorrow , who verbally acknowledged these results. Electronically Signed   By: Rolm Baptise M.D.   On: 06/20/2016 10:15    Procedures Procedures (including critical care time)   CRITICAL CARE Performed by: Fredia Sorrow Total critical care time: 95 minutes Critical care time was exclusive of separately billable procedures and treating other patients. Critical care was necessary to treat or prevent imminent or life-threatening deterioration. Critical care was time spent personally by me on the following activities: development of treatment plan with patient and/or surrogate as well as nursing, discussions with consultants, evaluation of patient's response to treatment, examination of patient, obtaining history from patient or surrogate, ordering and performing treatments and interventions, ordering and review of laboratory studies, ordering and review of radiographic studies, pulse oximetry and re-evaluation of patient's condition.    Medications Ordered in ED Medications  0.9 %  sodium chloride infusion ( Intravenous New Bag/Given 06/20/16 0825)  sodium chloride 0.9 % bolus 500 mL (0 mLs Intravenous Stopped  06/20/16 1010)  ondansetron (ZOFRAN) injection 4 mg (4 mg Intravenous Given 06/20/16 0825)  HYDROmorphone (DILAUDID) injection 1 mg (1 mg Intravenous Given 06/20/16 0825)  HYDROmorphone (DILAUDID) injection 1 mg (1 mg Intravenous Given 06/20/16 0903)  ondansetron (ZOFRAN) injection 4 mg (4 mg Intravenous Given 06/20/16 0903)  iopamidol (ISOVUE-370) 76 % injection 100 mL (100 mLs Intravenous Contrast Given 06/20/16 0949)  HYDROmorphone (DILAUDID) injection 1 mg (1 mg Intravenous Given 06/20/16 1012)  esmolol (BREVIBLOC) bolus via infusion 40,800 mcg (40,800 mcg Intravenous Bolus from Bag 06/20/16 1034)  esmolol (BREVIBLOC) 2000 mg / 100 mL (20 mg/mL) infusion (25 mcg/kg/min  81.6 kg Intravenous New Bag/Given 06/20/16 1035)     Initial Impression / Assessment and Plan / ED Course  I have reviewed the triage vital signs and the nursing notes.  Pertinent labs & imaging results that were available during my care of the patient were reviewed by me and considered in my medical decision making (see chart for details).    Patient with acute onset of chest pain back pain and abdominal pain this morning around 7. Associated with nausea and vomiting. Patient's been having some difficulty with abdominal pain for several weeks. But things seem to be worse today. Patient was concerned about heart attack.  Initial troponin was normal EKG without acute changes chest x-ray without any acute abnormalities.  Labs his otherwise however were suspicious for dissection with a markedly elevated d-dimer marked leukocytosis and some lactic acidosis.  CT angio scan chest abdomen pelvis shows a marked type B acute aortic dissection from just at the beginning of the descending part of the aorta all the way through the iliacs. With some infarcts into the kidneys.  Patient was very hypotensive upon arrival it improves some with pain medication. Once dissection was identified as always started a bolus and then a drip. Plan was to  titrate the blood pressure down to 099 systolic. Discussed with cardiology that referred me to cardiothoracic surgery they normally are the ones that do the admission of this sick [facility] although it's not an acute surgical intervention.  Dr. Lucianne Lei trigt accepted patient in transfer to [facility]. [facility] dated the transfer.   Prior to transfer no significant improvement in the blood pressure however systolic pressures were down below 200 more around 833-825 diastolic pressures were much better.  Patient required several doses of pain medicine for control of pain  as would be expected.  Final Clinical Impressions(s) / ED Diagnoses   Final diagnoses:  Dissection of thoracoabdominal aorta [facility])    New Prescriptions New Prescriptions   No medications on file     Fredia Sorrow, MD 06/20/16 1212

## 2016-06-20 NOTE — Progress Notes (Signed)
Pt with worsening lethargy and increased work of breathing over the past 30 minutes. Awakens when you call her name and answers questions appropriately but immediately nods back off. O2 requirements increased to 6L nasal cannula with sats maintaining 88-90%. Dr Prescott Gum made aware and order given for STAT CXR, ABG, Aline, and for RT to place pt on BiPAP. Husband at bedside and made aware of changes.  Vena Austria

## 2016-06-20 NOTE — Progress Notes (Signed)
CRITICAL VALUE ALERT  Critical value received:  Lactate 4/6  Date of notification:  06/20/16  Time of notification:  2005  Critical value read back:Yes.    Nurse who received alert:  Reinaldo Berber RN  MD notified (1st page):  Dr. Lucianne Lei trigt  Time of first page:  2045  MD notified (2nd page):  Time of second page:  Responding MD: Dr. Lucianne Lei trigt  Time MD responded:  2050

## 2016-06-20 NOTE — Progress Notes (Signed)
Unable to contact unit or RN by telephone.  PICC to be done 06-21-16.  If spouse at bedside , please obtain consent for PICC line.

## 2016-06-20 NOTE — ED Notes (Signed)
CRITICAL VALUE ALERT  Critical value received:  Lactic Acid 3.09  Date of notification:  06/20/16  Time of notification:  0853  Critical value read back:Yes.    Nurse who received alert:  Norm Salt, RN  MD notified (1st page):  Dr. Rogene Houston  Time of first page:  [phone]  MD notified (2nd page):  Time of second page:  Responding MD:  Dr. Rogene Houston  Time MD responded:  [phone]

## 2016-06-20 NOTE — Progress Notes (Signed)
Patient placed on bipap per MD order.  Patient tolerating well.  Will continue to monitor.  

## 2016-06-20 NOTE — Procedures (Signed)
Arterial Catheter Insertion Procedure Note Haley Graham [id number] [birth year]  Procedure: Insertion of Arterial Catheter  Indications: Blood pressure monitoring and Frequent blood sampling  Procedure Details Consent: Unable to obtain consent because of emergent medical necessity. Time Out: Verified patient identification, verified procedure, site/side was marked, verified correct patient position, special equipment/implants available, medications/allergies/relevent history reviewed, required imaging and test results available.  Performed  Maximum sterile technique was used including antiseptics, cap, gloves, gown, hand hygiene, mask and sheet. Skin prep: Chlorhexidine; local anesthetic administered 20 gauge catheter was inserted into left radial artery using the Seldinger technique.  Evaluation Blood flow good; BP tracing good. Complications: No apparent complications. RT placed arrow catheter on first attempt.   Haley Graham 06/20/2016

## 2016-06-20 NOTE — H&P (Signed)
[address]       [place] [address]             [place] Record #[id number] Date of Birth: 24-Sep-1955  Referring: No ref. provider found Primary Care: Glenda Chroman, MD  Chief Complaint:    Chief Complaint  Patient presents with  . 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 The patient developed severe sudden chest and abdominal pain at 7 AM. She was just resting in bed. She has history of hypertension but had been taking her medications-Toprol-XL 25 mg. She had some associated nausea . Because of the severe years symptoms presented to outside ED at [facility] where CTA was performed demonstrating the dissection.  The aorta is not abnormally enlarged. The ascending aorta and arch is uninvolved. The dissection extends through the abdominal aorta and there is decreased perfusion to the lower portion of the left kidney. There appears to be adequate perfusion of the mesenteric vessels and right renal vessels.  The patient has no acute neurologic symptoms. The patient smokes one pack per day.  The patient has had preceding complaints of abdominal pain and nausea with eating almost any meal. She had undergone recent colonoscopy and biopsy of benign polyp She had not undergone recent upper endoscopy. She denies hematemesis or hematochezia. She denies alcohol intake She denies weight loss despite having difficulty keeping her food down.  Current Activity/ Functional Status: The patient does not work She has sedentary lifestyle   Zubrod Score: At the time of surgery this patient's most  appropriate activity status/level should be described as: []     0    Normal activity, no symptoms []     1    Restricted in physical strenuous activity but ambulatory, able to do out light work [x]     2    Ambulatory and capable of self care, unable to do work activities, up and about                 more than 50%  Of the time                            []     3    Only limited self care, in bed greater than 50% of waking hours []     4    Completely disabled, no self care, confined to bed or chair []     5    Moribund  Past Medical History:  Diagnosis Date  . Anxiety   . Arthritis   . Diabetes mellitus without complication ([place])   . Gastroparesis   . GERD (gastroesophageal reflux disease)   . Hypertension   . IBS (irritable bowel syndrome)     Past Surgical History:  Procedure Laterality Date  . ABDOMINAL HYSTERECTOMY    . CESAREAN SECTION    . CHOLECYSTECTOMY    . COLONOSCOPY    . COLONOSCOPY N/A 06/10/2016   Procedure: COLONOSCOPY;  Surgeon: Rogene Houston, MD;  Location[facility];  Service: Endoscopy;  Laterality: N/A;  8:30  .  POLYPECTOMY  06/10/2016   Procedure: POLYPECTOMY;  Surgeon: Rogene Houston, MD;  Location: [facility];  Service: Endoscopy;;  multiple colon  . UPPER GASTROINTESTINAL ENDOSCOPY      History  Smoking Status  . Current Every Day Smoker  . Packs/day: 0.75  . Years: 23.00  . Types: Cigarettes  Smokeless Tobacco  . Never Used    Comment: 5-6 cigarettes. Cut back about 3 weeks ago. Smoking since age 57.    History  Alcohol Use No    Social History   Social History  . Marital status: Married    Spouse name: N/A  . Number of children: N/A  . Years of education: N/A   Occupational History  . Not on file.   Social History Main Topics  . Smoking status: Current Every Day Smoker    Packs/day: 0.75    Years: 23.00    Types: Cigarettes  . Smokeless tobacco: Never Used     Comment: 5-6 cigarettes. Cut back about 3 weeks ago. Smoking  since age 26.  Marland Kitchen Alcohol use No  . Drug use: No  . Sexual activity: Not on file   Other Topics Concern  . Not on file   Social History Narrative  . No narrative on file    Allergies  Allergen Reactions  . Codeine Nausea And Vomiting  . Reglan [Metoclopramide] Other (See Comments)    Heart races  . Penicillins Rash    Has patient had a PCN reaction causing immediate rash, facial/tongue/throat swelling, SOB or lightheadedness with hypotension: yes  Has patient had a PCN reaction causing severe rash involving mucus membranes or skin necrosis: no Has patient had a PCN reaction that required hospitalization no Has patient had a PCN reaction occurring within the last 10 years:no If all of the above answers are "NO", then may proceed with Cephalosporin use.    Current Facility-Administered Medications  Medication Dose Route Frequency Provider Last Rate Last Dose  . 0.9 %  sodium chloride infusion   Intravenous Continuous Fredia Sorrow, MD 100 mL/hr at 06/20/16 1300    . acetaminophen (TYLENOL) tablet 650 mg  650 mg Oral Q6H PRN Prescott Gum, Collier Salina, MD       Or  . acetaminophen (TYLENOL) suppository 650 mg  650 mg Rectal Q6H PRN Prescott Gum, Collier Salina, MD      . albuterol (PROVENTIL) (2.5 MG/3ML) 0.083% nebulizer solution 2.5 mg  2.5 mg Nebulization Q6H PRN Prescott Gum, Collier Salina, MD      . albuterol (PROVENTIL) (2.5 MG/3ML) 0.083% nebulizer solution 2.5 mg  2.5 mg Inhalation Q4H Prescott Gum, Collier Salina, MD      . ALPRAZolam Duanne Moron) tablet 0.5 mg  0.5 mg Oral TID PRN Ivin Poot, MD      . citalopram (CELEXA) 10 MG/5ML suspension 20 mg  20 mg Oral Daily Prescott Gum, Collier Salina, MD      . cloNIDine (CATAPRES - Dosed in mg/24 hr) patch 0.2 mg  0.2 mg Transdermal Weekly Prescott Gum, Collier Salina, MD   0.2 mg at 06/20/16 1500  . dextrose 5 %-0.45 % sodium chloride infusion   Intravenous Continuous Prescott Gum, Collier Salina, MD      . guaiFENesin [facility]) 12 hr tablet 600 mg  600 mg Oral BID Prescott Gum, Collier Salina, MD      .  hydrALAZINE (APRESOLINE) injection 10 mg  10 mg Intravenous Q4H PRN Ivin Poot, MD   10 mg at 06/20/16 1417  . HYDROmorphone (DILAUDID) injection 1 mg  1 mg Intravenous Q2H PRN Ivin Poot, MD   1 mg at 06/20/16 1417  . metoprolol tartrate (LOPRESSOR) tablet 50 mg  50 mg Oral BID Prescott Gum, Collier Salina, MD   50 mg at 06/20/16 1416  . mometasone-formoterol (DULERA) 200-5 MCG/ACT inhaler 2 puff  2 puff Inhalation BID Prescott Gum, Collier Salina, MD      . multivitamin with minerals tablet 1 tablet  1 tablet Oral Daily Prescott Gum, Collier Salina, MD      . nicotine (NICODERM CQ - dosed in mg/24 hours) patch 14 mg  14 mg Transdermal Daily Prescott Gum, Collier Salina, MD   14 mg at 06/20/16 1416  . nitroPRUSSide (NIPRIDE) 50 mg in dextrose 5 % 250 mL (0.2 mg/mL) infusion  0-1 mcg/kg/min Intravenous Titrated Prescott Gum, Collier Salina, MD 22.7 mL/hr at 06/20/16 1545 0.9 mcg/kg/min at 06/20/16 1545  . ondansetron (ZOFRAN) injection 4 mg  4 mg Intravenous Q6H PRN Ivin Poot, MD   4 mg at 06/20/16 1417  . pantoprazole (PROTONIX) EC tablet 40 mg  40 mg Oral Q1200 Prescott Gum, Collier Salina, MD   40 mg at 06/20/16 1416  . pneumococcal 23 valent vaccine (PNU-IMMUNE) injection 0.5 mL  0.5 mL Intramuscular Prior to discharge Prescott Gum, Collier Salina, MD      . promethazine [facility]) injection 12.5 mg  12.5 mg Intravenous Q6H PRN Ivin Poot, MD   12.5 mg at 06/20/16 1519  . rosuvastatin (CRESTOR) tablet 10 mg  10 mg Oral q1800 Prescott Gum, Collier Salina, MD      . sorbitol 70 % solution 30 mL  30 mL Oral Daily PRN Prescott Gum, Collier Salina, MD      . traMADol Veatrice Bourbon) tablet 50 mg  50 mg Oral Q6H PRN Ivin Poot, MD        Prescriptions Prior to Admission  Medication Sig Dispense Refill Last Dose  . ALPRAZolam (XANAX) 0.5 MG tablet Take 0.5 mg by mouth 3 (three) times daily as needed for anxiety.    06/19/2016 at Unknown time  . aspirin EC 81 MG tablet Take 324 mg by mouth once.    06/20/2016 at Unknown time  . citalopram (CELEXA) 40 MG tablet Take 40 mg by mouth at  bedtime.    06/19/2016 at Unknown time  . dicyclomine (BENTYL) 10 MG capsule Take 1 capsule (10 mg total) by mouth 2 (two) times daily before a meal. 60 capsule 5 06/19/2016 at Unknown time  . fluticasone (FLONASE) 50 MCG/ACT nasal spray Place 2 sprays into both nostrils daily.  11 06/19/2016 at Unknown time  . ondansetron (ZOFRAN) 4 MG tablet Take 4 mg by mouth 3 (three) times daily as needed for nausea.  1 unknown  . pantoprazole (PROTONIX) 40 MG tablet Take 40 mg by mouth daily.    06/19/2016 at Unknown time  . albuterol (VENTOLIN HFA) 108 (90 Base) MCG/ACT inhaler Inhale 2 puffs into the lungs every 6 (six) hours as needed for wheezing or shortness of breath.    06/10/2016 at Unknown time  . metoprolol succinate (TOPROL-XL) 25 MG 24 hr tablet Take 25 mg by mouth daily.     . rosuvastatin (CRESTOR) 10 MG tablet Take 10 mg by mouth daily.   More than a month at Unknown time    No family history on file.   Review of Systems:       Cardiac Review of Systems: Y or N  Chest Pain [ y   ]  Resting SOB Blue.Reese   ] Exertional  SOB  Blue.Reese  ]  Orthopnea Florencio.Farrier  ]   Pedal Edema [ n  ]   Palpitations [ n ] Syncope  [ n ]   Presyncope [  n ]  General Review of Systems: [Y] =n yes [  ]=no Constitional: recent weight change [  ]; anorexia [  ]; fatigue [  ]; nausea [  ]; night sweats [  ]; fever [  ]; or chills [  ]                                                               Dental: poor dentition[  ]; Last Dentist visit: Unknown  Eye : blurred vision [  ]; diplopia [   ]; vision changes [  ];  Amaurosis fugax[  ]; Resp: cough [ yes dry ];  wheezing[  ];  hemoptysis[  ]; shortness of breath[  ]; paroxysmal nocturnal dyspnea[  ]; dyspnea on exertion[  ]; or orthopnea[  ];  GI:  gallstones[ status post cholecystectomy  ], vomiting[ yes  ];  dysphagia[ yes ]; melena[  ];  hematochezia [  ]; heartburn[ yes ];   Hx of  Colonoscopy[ yes  ]; GU: kidney stones [  ]; hematuria[  ];   dysuria [  ];  nocturia[  ];  history of      obstruction [  ]; urinary frequency [  ]             Skin: rash, swelling[  ];, hair loss[  ];  peripheral edema[  ];  or itching[  ]; Musculosketetal: myalgias[  ];  joint swelling[  ];  joint erythema[  ];  joint pain[  ];  back pain[ yes  ];  Heme/Lymph: bruising[  ];  bleeding[  ];  anemia[  ];  Neuro: TIA[  ];  headaches[  ];  stroke[  ];  vertigo[  ];  seizures[  ];   paresthesias[  ];  difficulty walking[  ];  Psych:depression[ yes  ]; anxiety[  ];  Endocrine: diabetes[  ];  thyro yes id dysfunction[  ];  Immunizations: Flu [  ]; Pneumococcal[  ];  Other: No history of thoracic trauma or rib fractures  Physical Exam: BP 139/67   Pulse 74   Resp 16   Ht 5\' 3"  (1.6 m)   Wt 184 lb 15.5 oz (83.9 kg)   SpO2 93%   BMI 32.77 kg/m        Physical Exam  General: Anxious chronically ill-appearing middle-aged Caucasian female uncomfortable HEENT: Normocephalic pupils equal , dentition adequate Neck: Supple without JVD, adenopathy, or bruit Chest: Clear to auscultation, symmetrical breath sounds, no rhonchi, no tenderness             or deformity Cardiovascular: Regular rate and rhythm, no murmur, no gallop, peripheral pulses             palpable in all extremities Abdomen:  Soft, nontender, slightly distended no palpable mass or organomegaly Extremities:  Palpable pedal pulses but feet are with dusky changes at the toes              no venous stasis changes of the legs Rectal/GU: Deferred Neuro: Grossly non--focal and symmetrical throughout Skin: Clean and dry without rash  or ulceration   Diagnostic Studies & Laboratory data:     Recent Radiology Findings:   Dg Chest Port 1 View  Result Date: 06/20/2016 CLINICAL DATA:  Sudden onset sharp substernal chest pain that radiates to the back. Nausea, dizziness and shortness of breath. EXAM: PORTABLE CHEST 1 VIEW COMPARISON:  None. FINDINGS: Trachea is midline. Heart size is accentuated by AP supine technique. Probable mild scarring  in the right middle lobe and lingula. Lungs are otherwise clear. No pleural fluid. IMPRESSION: No acute findings. Electronically Signed   By: Lorin Picket M.D.   On: 06/20/2016 09:56   Ct Angio Chest/abd/pel For Dissection W And/or W/wo  Result Date: 06/20/2016 CLINICAL DATA:  Substernal chest pain radiating to back EXAM: CT ANGIOGRAPHY CHEST, ABDOMEN AND PELVIS TECHNIQUE: Multidetector CT imaging through the chest, abdomen and pelvis was performed using the standard protocol during bolus administration of intravenous contrast. Multiplanar reconstructed images and MIPs were obtained and reviewed to evaluate the vascular anatomy. CONTRAST:  100 cc Isovue 370 IV COMPARISON:  CT abdomen and pelvis 10/27/2011 FINDINGS: CTA CHEST FINDINGS Cardiovascular: There is the type B aortic dissection beginning in the distal aortic arch just beyond the origin of the great vessels. The true lumen is compressed by the false lumen. No aneurysm. No pulmonary embolus. Heart is normal size. Mediastinum/Nodes: No mediastinal, hilar, or axillary adenopathy. Lungs/Pleura: Atelectasis or scarring in the lingula. Lungs otherwise clear. No effusions. Musculoskeletal: No acute bony abnormality. Review of the MIP images confirms the above findings. CTA ABDOMEN AND PELVIS FINDINGS VASCULAR Aorta: Dissection continues throughout the abdominal aorta with the celiac artery and superior mesenteric artery arising from the true lumen anteriorly. The dissection may extend into the left renal artery where there is only a small amount of blood flow noted in the proximal and mid left renal artery. Right renal artery appears to arise from the false lumen and is patent. The the true lumen appears thrombosed below the origin of the superior mesenteric artery. Inferior mesenteric artery arises from the thrombosed true lumen and appears occluded proximally, reconstitutes several cm from the origin. Celiac: Patent. SMA: Patent. Renals: As above. IMA: As  above. Inflow: To the dissection continues into both common iliac arteries with thrombosed true lumens. The dissection appears to terminate at approximately the level of the common iliac bifurcation bilaterally. Veins: Grossly patent and unremarkable. Review of the MIP images confirms the above findings. NON-VASCULAR Hepatobiliary: Mild diffuse fatty infiltration. Prior cholecystectomy. Pancreas: No focal abnormality or ductal dilatation. Spleen: No focal abnormality.  Normal size. Adrenals/Urinary Tract: Nodules in the left adrenal gland are low-density on the precontrast imaging compatible with small adenomas. There are areas of non perfusion noted in the mid and lower poles of the left kidney compatible with infarction, likely related to the involvement of the left renal artery by dissection. Urinary bladder unremarkable. Stomach/Bowel: Stomach, large and small bowel grossly unremarkable. Lymphatic: No adenopathy. Reproductive: Prior hysterectomy.  No adnexal masses. Other: No free fluid or free air. Musculoskeletal: No acute bony abnormality. Review of the MIP images confirms the above findings. 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 This involves the descending thoracic aorta, abdominal aorta and iliac vessels. The true lumen is compressed by the larger false lumen, and is thrombosed below the SMA/renal artery origins. The dissection appears to extend into the left renal artery with partial occlusion and areas of infarct in the mid and lower pole of  the left kidney. Fatty infiltration of the liver. Critical Value/emergent results were called by telephone at the time of interpretation on 06/20/2016 at 10:13 am to Dr. Fredia Sorrow , who verbally acknowledged these results. Electronically Signed   By: Rolm Baptise M.D.   On: 06/20/2016 10:15     I have independently reviewed the above radiologic studies.  Recent Lab Findings: Lab  Results  Component Value Date   WBC 19.2 (H) 06/20/2016   HGB 15.4 (H) 06/20/2016   HCT 44.1 06/20/2016   PLT 210 06/20/2016   GLUCOSE 195 (H) 06/20/2016   ALT 27 06/20/2016   AST 22 06/20/2016   NA 139 06/20/2016   K 3.8 06/20/2016   CL 108 06/20/2016   CREATININE 0.87 06/20/2016   BUN 16 06/20/2016   CO2 19 (L) 06/20/2016      Assessment / Plan:     Acute Stanford type B aortic dissection with some compromise of blood flow to the upper left kidney Hypertension COPD history of smoking Anxiety depression Mild metabolic acidosis on presentation  Plan admission for pain control, blood pressure control, bedrest and IV hydration. Correction of acid-base deficit She will be evaluated with echocardiogram  Plan of care and implications of diagnosis and potential complications reviewed with patient and family.    @ME1 @ 06/20/2016 3:58 PM

## 2016-06-20 NOTE — ED Notes (Signed)
Goal SBP 130 for Esmolol infusion per Dr. Gloris Manchester verbal order to ED RN.

## 2016-06-20 NOTE — Progress Notes (Signed)
Dr. Prescott Gum made aware on rounds of aline pressures being ~40 higher than cuff pressures. Cuff pressures had been reading consistent prior to aline insertion. Order to use cuff pressures for medication titration and goals.  Vena Austria

## 2016-06-20 NOTE — Progress Notes (Addendum)
Haley Graham started by RT and ABG drawn. Dr. Prescott Gum made aware of ABG results and order received for 2 amps of bicarb and a repeat ABG at 1900. Also made aware of Lactic Acid 3.5. Pt still lethargic but awakens to name and answers questions then nods back off. BiPAP started per RT. Husband at bedside and daughter updated over phone.   Haley Graham

## 2016-06-20 NOTE — ED Notes (Signed)
Dr. Rogene Houston gave ED RN verbal order for SBP goal to be 140 with Esmolol infusion.

## 2016-06-20 NOTE — Plan of Care (Signed)
Problem: Pain Managment: Goal: General experience of comfort will improve Outcome: Progressing Pt reports pain improved  Problem: Physical Regulation: Goal: Ability to maintain clinical measurements within normal limits will improve Outcome: Progressing Tight BP management requiring multiple medications at this time  Problem: Activity: Goal: Risk for activity intolerance will decrease Outcome: Not Progressing Pt on bedrest tonight

## 2016-06-20 NOTE — ED Notes (Signed)
Additional bag of Esmolol given to [facility] RN per his request in case they were to run out of medication during transport.

## 2016-06-21 ENCOUNTER — Inpatient Hospital Stay (HOSPITAL_COMMUNITY): Payer: BLUE CROSS/BLUE SHIELD

## 2016-06-21 ENCOUNTER — Other Ambulatory Visit (HOSPITAL_COMMUNITY): Payer: BLUE CROSS/BLUE SHIELD

## 2016-06-21 DIAGNOSIS — I5030 Unspecified diastolic (congestive) heart failure: Secondary | ICD-10-CM

## 2016-06-21 LAB — GLUCOSE, CAPILLARY
Glucose-Capillary: 209 mg/dL — ABNORMAL HIGH (ref 65–99)
Glucose-Capillary: 237 mg/dL — ABNORMAL HIGH (ref 65–99)
Glucose-Capillary: 249 mg/dL — ABNORMAL HIGH (ref 65–99)
Glucose-Capillary: 253 mg/dL — ABNORMAL HIGH (ref 65–99)
Glucose-Capillary: 258 mg/dL — ABNORMAL HIGH (ref 65–99)
Glucose-Capillary: 306 mg/dL — ABNORMAL HIGH (ref 65–99)

## 2016-06-21 LAB — COMPREHENSIVE METABOLIC PANEL
ALT: 116 U/L — ABNORMAL HIGH (ref 14–54)
AST: 147 U/L — ABNORMAL HIGH (ref 15–41)
Albumin: 3.2 g/dL — ABNORMAL LOW (ref 3.5–5.0)
Alkaline Phosphatase: 91 U/L (ref 38–126)
Anion gap: 13 (ref 5–15)
BUN: 22 mg/dL — ABNORMAL HIGH (ref 6–20)
CO2: 22 mmol/L (ref 22–32)
Calcium: 8.5 mg/dL — ABNORMAL LOW (ref 8.9–10.3)
Chloride: 101 mmol/L (ref 101–111)
Creatinine, Ser: 1.34 mg/dL — ABNORMAL HIGH (ref 0.44–1.00)
GFR calc Af Amer: 49 mL/min — ABNORMAL LOW (ref >60)
GFR calc non Af Amer: 42 mL/min — ABNORMAL LOW (ref >60)
Glucose, Bld: 255 mg/dL — ABNORMAL HIGH (ref 65–99)
Potassium: 3.7 mmol/L (ref 3.5–5.1)
Sodium: 136 mmol/L (ref 135–145)
Total Bilirubin: 0.6 mg/dL (ref 0.3–1.2)
Total Protein: 6.5 g/dL (ref 6.5–8.1)

## 2016-06-21 LAB — POCT I-STAT 3, ART BLOOD GAS (G3+)
Acid-Base Excess: 1 mmol/L (ref 0.0–2.0)
Bicarbonate: 25.5 mmol/L (ref 20.0–28.0)
O2 Saturation: 98 %
Patient temperature: 97.4
TCO2: 27 mmol/L (ref 0–100)
pCO2 arterial: 39.8 mmHg (ref 32.0–48.0)
pH, Arterial: 7.413 (ref 7.350–7.450)
pO2, Arterial: 104 mmHg (ref 83.0–108.0)

## 2016-06-21 LAB — COOXEMETRY PANEL
Carboxyhemoglobin: 1.3 % (ref 0.5–1.5)
Methemoglobin: 1.1 % (ref 0.0–1.5)
O2 Saturation: 92.9 %
Total hemoglobin: 14.6 g/dL (ref 12.0–16.0)

## 2016-06-21 LAB — POCT I-STAT, CHEM 8
BUN: 31 mg/dL — ABNORMAL HIGH (ref 6–20)
Calcium, Ion: 1.08 mmol/L — ABNORMAL LOW (ref 1.15–1.40)
Chloride: 99 mmol/L — ABNORMAL LOW (ref 101–111)
Creatinine, Ser: 1.5 mg/dL — ABNORMAL HIGH (ref 0.44–1.00)
Glucose, Bld: 228 mg/dL — ABNORMAL HIGH (ref 65–99)
HCT: 47 % — ABNORMAL HIGH (ref 36.0–46.0)
Hemoglobin: 16 g/dL — ABNORMAL HIGH (ref 12.0–15.0)
Potassium: 3.7 mmol/L (ref 3.5–5.1)
Sodium: 135 mmol/L (ref 135–145)
TCO2: 22 mmol/L (ref 0–100)

## 2016-06-21 LAB — HIV ANTIBODY (ROUTINE TESTING W REFLEX): HIV Screen 4th Generation wRfx: NONREACTIVE

## 2016-06-21 LAB — ECHOCARDIOGRAM COMPLETE
Height: 63 in
Weight: 2970.04 oz

## 2016-06-21 LAB — CBC
HCT: 43 % (ref 36.0–46.0)
Hemoglobin: 14.2 g/dL (ref 12.0–15.0)
MCH: 33 pg (ref 26.0–34.0)
MCHC: 33 g/dL (ref 30.0–36.0)
MCV: 100 fL (ref 78.0–100.0)
Platelets: 185 10*3/uL (ref 150–400)
RBC: 4.3 MIL/uL (ref 3.87–5.11)
RDW: 12.6 % (ref 11.5–15.5)
WBC: 21.5 10*3/uL — ABNORMAL HIGH (ref 4.0–10.5)

## 2016-06-21 LAB — AMYLASE: Amylase: 461 U/L — ABNORMAL HIGH (ref 28–100)

## 2016-06-21 LAB — TROPONIN I: Troponin I: 0.04 ng/mL (ref <0.03)

## 2016-06-21 LAB — LACTIC ACID, PLASMA
Lactic Acid, Venous: 3.4 mmol/L (ref 0.5–1.9)
Lactic Acid, Venous: 3.8 mmol/L (ref 0.5–1.9)

## 2016-06-21 MED ORDER — CITALOPRAM HYDROBROMIDE 20 MG PO TABS
20.0000 mg | ORAL_TABLET | Freq: Every day | ORAL | Status: DC
  Administered 2016-06-25 – 2016-06-27 (×2): 20 mg via ORAL
  Filled 2016-06-21 (×2): qty 1

## 2016-06-21 MED ORDER — MIDAZOLAM HCL 2 MG/2ML IJ SOLN
INTRAMUSCULAR | Status: AC
  Filled 2016-06-21: qty 2

## 2016-06-21 MED ORDER — MIDAZOLAM HCL 2 MG/2ML IJ SOLN
2.0000 mg | INTRAMUSCULAR | Status: DC | PRN
  Administered 2016-06-21: 2 mg via INTRAVENOUS
  Administered 2016-06-21: 1 mg via INTRAVENOUS
  Administered 2016-06-22 – 2016-06-26 (×4): 2 mg via INTRAVENOUS
  Filled 2016-06-21 (×6): qty 2

## 2016-06-21 MED ORDER — CHLORHEXIDINE GLUCONATE CLOTH 2 % EX PADS
6.0000 | MEDICATED_PAD | Freq: Every day | CUTANEOUS | Status: DC
  Administered 2016-06-21 – 2016-07-01 (×11): 6 via TOPICAL

## 2016-06-21 MED ORDER — ALBUMIN HUMAN 5 % IV SOLN
12.5000 g | Freq: Four times a day (QID) | INTRAVENOUS | Status: AC
  Administered 2016-06-21 – 2016-06-22 (×2): 12.5 g via INTRAVENOUS
  Filled 2016-06-21 (×2): qty 250

## 2016-06-21 MED ORDER — INSULIN GLARGINE 100 UNIT/ML ~~LOC~~ SOLN
24.0000 [IU] | Freq: Two times a day (BID) | SUBCUTANEOUS | Status: DC
  Administered 2016-06-21 – 2016-06-25 (×9): 24 [IU] via SUBCUTANEOUS
  Filled 2016-06-21 (×9): qty 0.24

## 2016-06-21 MED ORDER — ALBUTEROL SULFATE (2.5 MG/3ML) 0.083% IN NEBU
2.5000 mg | INHALATION_SOLUTION | Freq: Four times a day (QID) | RESPIRATORY_TRACT | Status: DC
  Administered 2016-06-21 – 2016-06-26 (×18): 2.5 mg via RESPIRATORY_TRACT
  Filled 2016-06-21 (×18): qty 3

## 2016-06-21 MED ORDER — PANTOPRAZOLE SODIUM 40 MG IV SOLR
40.0000 mg | INTRAVENOUS | Status: DC
  Administered 2016-06-21: 40 mg via INTRAVENOUS
  Filled 2016-06-21: qty 40

## 2016-06-21 MED ORDER — FENTANYL 25 MCG/HR TD PT72
25.0000 ug | MEDICATED_PATCH | TRANSDERMAL | Status: DC
  Administered 2016-06-21: 25 ug via TRANSDERMAL
  Filled 2016-06-21: qty 1

## 2016-06-21 MED ORDER — SODIUM CHLORIDE 0.9% FLUSH
10.0000 mL | Freq: Two times a day (BID) | INTRAVENOUS | Status: DC
  Administered 2016-06-21: 20 mL
  Administered 2016-06-21: 10 mL
  Administered 2016-06-22 – 2016-06-23 (×2): 20 mL
  Administered 2016-06-23 – 2016-06-26 (×4): 10 mL
  Administered 2016-06-27: 20 mL
  Administered 2016-06-27 – 2016-07-16 (×26): 10 mL
  Administered 2016-07-18: 20 mL
  Administered 2016-07-18 – 2016-07-23 (×11): 10 mL
  Administered 2016-07-24: 20 mL
  Administered 2016-07-25 – 2016-07-27 (×3): 10 mL
  Administered 2016-07-27: 40 mL
  Administered 2016-07-28 – 2016-08-04 (×11): 10 mL
  Administered 2016-08-05: 20 mL
  Administered 2016-08-05 – 2016-08-07 (×3): 10 mL

## 2016-06-21 MED ORDER — ALBUMIN HUMAN 5 % IV SOLN: 12.5000 g | Freq: Four times a day (QID) | INTRAVENOUS | Status: DC

## 2016-06-21 MED ORDER — SODIUM BICARBONATE 8.4 % IV SOLN
50.0000 meq | Freq: Once | INTRAVENOUS | Status: AC
  Administered 2016-06-21: 50 meq via INTRAVENOUS
  Filled 2016-06-21: qty 50

## 2016-06-21 MED ORDER — SIMETHICONE 80 MG PO CHEW
80.0000 mg | CHEWABLE_TABLET | Freq: Four times a day (QID) | ORAL | Status: DC | PRN
  Administered 2016-06-21: 80 mg via ORAL
  Filled 2016-06-21: qty 1

## 2016-06-21 MED ORDER — FENTANYL 25 MCG/HR TD PT72: 50.0000 ug | MEDICATED_PATCH | TRANSDERMAL | Status: DC

## 2016-06-21 MED ORDER — SODIUM CHLORIDE 0.9% FLUSH
10.0000 mL | INTRAVENOUS | Status: DC | PRN
  Administered 2016-06-30 – 2016-07-16 (×5): 10 mL
  Filled 2016-06-21 (×5): qty 40

## 2016-06-21 MED ORDER — HYDROMORPHONE HCL 1 MG/ML IJ SOLN
1.0000 mg | INTRAMUSCULAR | Status: DC | PRN
  Administered 2016-06-21 – 2016-07-06 (×49): 1 mg via INTRAVENOUS
  Filled 2016-06-21 (×50): qty 1

## 2016-06-21 NOTE — Progress Notes (Signed)
  Subjective: Better overall- BP,acid base, pulmonary nsr Still having abd, back pain- place patch Objective: Vital signs in last 24 hours: Temp:  [97.1 F (36.2 C)-98.6 F (37 C)] 97.4 F (36.3 C) (05/20 0400) Pulse Rate:  [57-96] 94 (05/20 0845) Cardiac Rhythm: Sinus tachycardia (05/20 0800) Resp:  [11-32] 15 (05/20 0845) BP: (95-210)/(45-100) 138/61 (05/20 0845) SpO2:  [88 %-98 %] 92 % (05/20 0845) Arterial Line BP: (97-192)/(49-108) 178/67 (05/20 0845) FiO2 (%):  [60 %-70 %] 60 % (05/20 0733) Weight:  [184 lb 15.5 oz (83.9 kg)-185 lb 10 oz (84.2 kg)] 185 lb 10 oz (84.2 kg) (05/20 0400)  Hemodynamic parameters for last 24 hours:  stable  Intake/Output from previous day: 05/19 0701 - 05/20 0700 In: 3193.9 [P.O.:200; I.V.:2493.9; IV Piggyback:500] Out: 2385 [Urine:2385] Intake/Output this shift: Total I/O In: 124.8 [I.V.:124.8] Out: 150 [Urine:150]       Exam    General- alert and comfortable   Lungs- clear without rales, wheezes   Cor- regular rate and rhythm, no murmur , gallop   Abdomen-slight distension,decreased bowel sounds   Extremities - warm, non-tender, minimal edema   Neuro- oriented, appropriate, no focal weakness   Lab Results:  Recent Labs  06/20/16 0815 06/21/16 0328  WBC 19.2* 21.5*  HGB 15.4* 14.2  HCT 44.1 43.0  PLT 210 185   BMET:  Recent Labs  06/20/16 1830 06/21/16 0328  NA 135 136  K 4.3 3.7  CL 100* 101  CO2 22 22  GLUCOSE 316* 255*  BUN 16 22*  CREATININE 1.17* 1.34*  CALCIUM 8.4* 8.5*    PT/INR: No results for input(s): LABPROT, INR in the last 72 hours. ABG    Component Value Date/Time   PHART 7.413 06/21/2016 0350   HCO3 25.5 06/21/2016 0350   TCO2 27 06/21/2016 0350   ACIDBASEDEF 1.0 06/20/2016 1900   O2SAT 98.0 06/21/2016 0350   CBG (last 3)   Recent Labs  06/21/16 0021 06/21/16 0353 06/21/16 0800  GLUCAP 306* 253* 258*    Assessment/Plan: S/P  Cont current care Situation d/w patient and family  including the potential complications of type B dissection   LOS: 1 day    [name] 06/21/2016

## 2016-06-21 NOTE — Progress Notes (Signed)
Patient taken off of bipap and placed on 5L nasal cannula.  Currently tolerating well, only complaint is of chest burning.  Will continue to monitor.

## 2016-06-21 NOTE — Progress Notes (Signed)
Peripherally Inserted Central Catheter/Midline Placement  The IV Nurse has discussed with the patient and/or persons authorized to consent for the patient, the purpose of this procedure and the potential benefits and risks involved with this procedure.  The benefits include less needle sticks, lab draws from the catheter, and the patient may be discharged home with the catheter. Risks include, but not limited to, infection, bleeding, blood clot (thrombus formation), and puncture of an artery; nerve damage and irregular heartbeat and possibility to perform a PICC exchange if needed/ordered by physician.  Alternatives to this procedure were also discussed.  Bard Power PICC patient education guide, fact sheet on infection prevention and patient information card has been provided to patient /or left at bedside.  Husband requested to wait on dtr arrival, once arrived, risks and benefits reviewed and consent given- signed by husband due to pts sedated state.  PICC/Midline Placement Documentation  PICC Double Lumen 32/95/18 PICC Right Basilic 37 cm 0 cm (Active)  Indication for Insertion or Continuance of Line Vasoactive infusions;Prolonged intravenous therapies 06/21/2016 10:04 AM  Exposed Catheter (cm) 0 cm 06/21/2016 10:04 AM  Site Assessment Clean;Dry;Intact 06/21/2016 10:04 AM  Lumen #1 Status Flushed;Saline locked;Blood return noted 06/21/2016 10:04 AM  Lumen #2 Status Flushed;Saline locked;Blood return noted 06/21/2016 10:04 AM  Dressing Type Transparent 06/21/2016 10:04 AM  Dressing Status Clean;Dry;Antimicrobial disc in place;Intact 06/21/2016 10:04 AM  Line Care Connections checked and tightened 06/21/2016 10:04 AM  Line Adjustment (NICU/IV Team Only) No 06/21/2016 10:04 AM  Dressing Intervention New dressing 06/21/2016 10:04 AM  Dressing Change Due 06/28/16 06/21/2016 10:04 AM       Rolena Infante 06/21/2016, 10:05 AM

## 2016-06-21 NOTE — Progress Notes (Signed)
CT surgery p.m. Rounds  Blood pressure improved, decreased requirement of IV esmolol and nitroprusside  Amylase elevated at 440-NG tube placed for gastric dilatation. Abdomen less tender Lactic acid level stable at 3.5 urine output adequate  Echocardiogram shows normal biventricular function, no pericardial effusion, no evidence of TR, MR, AI  With pancreatitis and ileus after Stanford type B dissection patient may need intravenous nutrition-we will check prealbumin level in a.m.

## 2016-06-21 NOTE — Plan of Care (Signed)
Problem: Physical Regulation: Goal: Ability to maintain clinical measurements within normal limits will improve Outcome: Progressing Tight BP control  Problem: Nutrition: Goal: Adequate nutrition will be maintained Outcome: Not Progressing Pt remains NPO, nutrition addressed in rounds  Problem: Bowel/Gastric: Goal: Will not experience complications related to bowel motility Outcome: Not Progressing NG tube placed today, meds and interventions for comfort

## 2016-06-22 ENCOUNTER — Inpatient Hospital Stay (HOSPITAL_COMMUNITY): Payer: BLUE CROSS/BLUE SHIELD

## 2016-06-22 DIAGNOSIS — I7102 Dissection of abdominal aorta: Secondary | ICD-10-CM

## 2016-06-22 DIAGNOSIS — I10 Essential (primary) hypertension: Secondary | ICD-10-CM

## 2016-06-22 LAB — GLUCOSE, CAPILLARY
Glucose-Capillary: 291 mg/dL — ABNORMAL HIGH (ref 65–99)
Glucose-Capillary: 166 mg/dL — ABNORMAL HIGH (ref 65–99)
Glucose-Capillary: 158 mg/dL — ABNORMAL HIGH (ref 65–99)
Glucose-Capillary: 180 mg/dL — ABNORMAL HIGH (ref 65–99)
Glucose-Capillary: 131 mg/dL — ABNORMAL HIGH (ref 65–99)
Glucose-Capillary: 138 mg/dL — ABNORMAL HIGH (ref 65–99)

## 2016-06-22 LAB — CBC
HCT: 43.2 % (ref 36.0–46.0)
Hemoglobin: 14.6 g/dL (ref 12.0–15.0)
MCH: 33.3 pg (ref 26.0–34.0)
MCHC: 33.8 g/dL (ref 30.0–36.0)
MCV: 98.4 fL (ref 78.0–100.0)
Platelets: 169 10*3/uL (ref 150–400)
RBC: 4.39 MIL/uL (ref 3.87–5.11)
RDW: 12.5 % (ref 11.5–15.5)
WBC: 19.3 10*3/uL — ABNORMAL HIGH (ref 4.0–10.5)

## 2016-06-22 LAB — POCT I-STAT, CHEM 8
BUN: 39 mg/dL — ABNORMAL HIGH (ref 6–20)
Calcium, Ion: 0.99 mmol/L — ABNORMAL LOW (ref 1.15–1.40)
Chloride: 106 mmol/L (ref 101–111)
Creatinine, Ser: 1.1 mg/dL — ABNORMAL HIGH (ref 0.44–1.00)
Glucose, Bld: 117 mg/dL — ABNORMAL HIGH (ref 65–99)
HCT: 31 % — ABNORMAL LOW (ref 36.0–46.0)
Hemoglobin: 10.5 g/dL — ABNORMAL LOW (ref 12.0–15.0)
Potassium: 3.4 mmol/L — ABNORMAL LOW (ref 3.5–5.1)
Sodium: 140 mmol/L (ref 135–145)
TCO2: 21 mmol/L (ref 0–100)

## 2016-06-22 LAB — POCT I-STAT 3, ART BLOOD GAS (G3+)
Acid-base deficit: 1 mmol/L (ref 0.0–2.0)
Bicarbonate: 22.2 mmol/L (ref 20.0–28.0)
O2 Saturation: 97 %
Patient temperature: 98.1
TCO2: 23 mmol/L (ref 0–100)
pCO2 arterial: 30.5 mmHg — ABNORMAL LOW (ref 32.0–48.0)
pH, Arterial: 7.47 — ABNORMAL HIGH (ref 7.350–7.450)
pO2, Arterial: 78 mmHg — ABNORMAL LOW (ref 83.0–108.0)

## 2016-06-22 LAB — COOXEMETRY PANEL
Carboxyhemoglobin: 0.6 % (ref 0.5–1.5)
Methemoglobin: 1.2 % (ref 0.0–1.5)
O2 Saturation: 79.3 %
Total hemoglobin: 15.1 g/dL (ref 12.0–16.0)

## 2016-06-22 LAB — LACTIC ACID, PLASMA: Lactic Acid, Venous: 4.4 mmol/L (ref 0.5–1.9)

## 2016-06-22 LAB — COMPREHENSIVE METABOLIC PANEL
ALT: 535 U/L — ABNORMAL HIGH (ref 14–54)
AST: 486 U/L — ABNORMAL HIGH (ref 15–41)
Albumin: 3 g/dL — ABNORMAL LOW (ref 3.5–5.0)
Alkaline Phosphatase: 93 U/L (ref 38–126)
Anion gap: 15 (ref 5–15)
BUN: 32 mg/dL — ABNORMAL HIGH (ref 6–20)
CO2: 22 mmol/L (ref 22–32)
Calcium: 8.2 mg/dL — ABNORMAL LOW (ref 8.9–10.3)
Chloride: 97 mmol/L — ABNORMAL LOW (ref 101–111)
Creatinine, Ser: 1.38 mg/dL — ABNORMAL HIGH (ref 0.44–1.00)
GFR calc Af Amer: 47 mL/min — ABNORMAL LOW (ref >60)
GFR calc non Af Amer: 41 mL/min — ABNORMAL LOW (ref >60)
Glucose, Bld: 172 mg/dL — ABNORMAL HIGH (ref 65–99)
Potassium: 3.6 mmol/L (ref 3.5–5.1)
Sodium: 134 mmol/L — ABNORMAL LOW (ref 135–145)
Total Bilirubin: 0.9 mg/dL (ref 0.3–1.2)
Total Protein: 5.6 g/dL — ABNORMAL LOW (ref 6.5–8.1)

## 2016-06-22 LAB — BLOOD GAS, ARTERIAL
Acid-base deficit: 2.9 mmol/L — ABNORMAL HIGH (ref 0.0–2.0)
Bicarbonate: 21.1 mmol/L (ref 20.0–28.0)
FIO2: 50
O2 Saturation: 92.7 %
Patient temperature: 98.6
pCO2 arterial: 34.9 mmHg (ref 32.0–48.0)
pH, Arterial: 7.399 (ref 7.350–7.450)
pO2, Arterial: 69.1 mmHg — ABNORMAL LOW (ref 83.0–108.0)

## 2016-06-22 LAB — AMYLASE: Amylase: 316 U/L — ABNORMAL HIGH (ref 28–100)

## 2016-06-22 LAB — PREALBUMIN: Prealbumin: 14.8 mg/dL — ABNORMAL LOW (ref 18–38)

## 2016-06-22 MED ORDER — ORAL CARE MOUTH RINSE
15.0000 mL | Freq: Two times a day (BID) | OROMUCOSAL | Status: DC
  Administered 2016-06-22 – 2016-06-23 (×2): 15 mL via OROMUCOSAL

## 2016-06-22 MED ORDER — LEVOFLOXACIN IN D5W 500 MG/100ML IV SOLN
500.0000 mg | INTRAVENOUS | Status: DC
  Filled 2016-06-22: qty 100

## 2016-06-22 MED ORDER — PANTOPRAZOLE SODIUM 40 MG IV SOLR
40.0000 mg | INTRAVENOUS | Status: DC
  Administered 2016-06-22 – 2016-07-25 (×33): 40 mg via INTRAVENOUS
  Filled 2016-06-22 (×34): qty 40

## 2016-06-22 MED ORDER — CHLORHEXIDINE GLUCONATE 0.12 % MT SOLN
15.0000 mL | Freq: Two times a day (BID) | OROMUCOSAL | Status: DC
  Administered 2016-06-22 – 2016-06-23 (×2): 15 mL via OROMUCOSAL
  Filled 2016-06-22: qty 15

## 2016-06-22 MED ORDER — POTASSIUM CHLORIDE 10 MEQ/50ML IV SOLN
10.0000 meq | INTRAVENOUS | Status: AC
  Administered 2016-06-22 (×2): 10 meq via INTRAVENOUS

## 2016-06-22 MED ORDER — CHLORHEXIDINE GLUCONATE 0.12 % MT SOLN: 15.0000 mL | Freq: Two times a day (BID) | OROMUCOSAL | Status: DC

## 2016-06-22 MED ORDER — DEXTROSE 5 % IV SOLN
250.0000 mg | INTRAVENOUS | Status: DC
  Administered 2016-06-22 – 2016-06-27 (×6): 250 mg via INTRAVENOUS
  Filled 2016-06-22 (×6): qty 250

## 2016-06-22 MED ORDER — CLINDAMYCIN PHOSPHATE 600 MG/50ML IV SOLN
600.0000 mg | Freq: Three times a day (TID) | INTRAVENOUS | Status: DC
  Administered 2016-06-22 – 2016-06-26 (×11): 600 mg via INTRAVENOUS
  Filled 2016-06-22 (×11): qty 50

## 2016-06-22 MED ORDER — ALBUMIN HUMAN 5 % IV SOLN
12.5000 g | Freq: Four times a day (QID) | INTRAVENOUS | Status: AC
  Administered 2016-06-22 (×2): 12.5 g via INTRAVENOUS
  Filled 2016-06-22 (×2): qty 250

## 2016-06-22 MED ORDER — ORAL CARE MOUTH RINSE: 15.0000 mL | Freq: Two times a day (BID) | OROMUCOSAL | Status: DC

## 2016-06-22 MED ORDER — FENTANYL 25 MCG/HR TD PT72
50.0000 ug | MEDICATED_PATCH | TRANSDERMAL | Status: DC
  Administered 2016-06-22 – 2016-06-28 (×3): 50 ug via TRANSDERMAL
  Filled 2016-06-22 (×3): qty 2

## 2016-06-22 MED ORDER — PANTOPRAZOLE SODIUM 40 MG PO TBEC
40.0000 mg | DELAYED_RELEASE_TABLET | Freq: Every day | ORAL | Status: DC
  Filled 2016-06-22: qty 1

## 2016-06-22 MED ORDER — POTASSIUM CHLORIDE 10 MEQ/50ML IV SOLN
INTRAVENOUS | Status: AC
  Administered 2016-06-22: 10 meq via INTRAVENOUS
  Filled 2016-06-22: qty 50

## 2016-06-22 MED ORDER — METOPROLOL TARTRATE 5 MG/5ML IV SOLN
5.0000 mg | INTRAVENOUS | Status: DC
  Administered 2016-06-22 – 2016-06-26 (×23): 5 mg via INTRAVENOUS
  Filled 2016-06-22 (×23): qty 5

## 2016-06-22 MED ORDER — FENTANYL 25 MCG/HR TD PT72: 50.0000 ug | MEDICATED_PATCH | TRANSDERMAL | Status: DC

## 2016-06-22 NOTE — Progress Notes (Signed)
  Subjective: Chest pain resolved C/o lower abdominal pain worse than her preceding chronic lower abdominal pain NG w/ 1500 cc output Bicarb 22- pH 7.35 LFT's increased, amylase lower Patient appears dry- inc ivF Co-ox > 70%, BP controlled but still on nitroprusside, esmolol Objective: Vital signs in last 24 hours: Temp:  [97.5 F (36.4 C)-98 F (36.7 C)] 98 F (36.7 C) (05/21 0715) Pulse Rate:  [85-118] 106 (05/21 0750) Cardiac Rhythm: Normal sinus rhythm (05/21 0800) Resp:  [11-30] 17 (05/21 0750) BP: (93-150)/(48-119) 135/72 (05/21 0800) SpO2:  [87 %-97 %] 93 % (05/21 0758) Arterial Line BP: (87-193)/(49-109) 114/62 (05/21 0715) Weight:  [182 lb 1.6 oz (82.6 kg)] 182 lb 1.6 oz (82.6 kg) (05/21 0415)  Hemodynamic parameters for last 24 hours:  nsr  Intake/Output from previous day: 05/20 0701 - 05/21 0700 In: 2750.6 [I.V.:2220.6; NG/GT:30; IV Piggyback:500] Out: 2415 [Urine:915; Emesis/NG output:1500] Intake/Output this shift: Total I/O In: 79.9 [I.V.:79.9] Out: 60 [Urine:60]  abd tender w/o guarding, dec bowel sounds + pedal pulses CXR RLL infiltrate/ atelectasis KUB w/o sig dilatation of bowels  Lab Results:  Recent Labs  06/21/16 0328 06/21/16 1546 06/22/16 0351  WBC 21.5*  --  19.3*  HGB 14.2 16.0* 14.6  HCT 43.0 47.0* 43.2  PLT 185  --  169   BMET:  Recent Labs  06/21/16 0328 06/21/16 1546 06/22/16 0351  NA 136 135 134*  K 3.7 3.7 3.6  CL 101 99* 97*  CO2 22  --  22  GLUCOSE 255* 228* 172*  BUN 22* 31* 32*  CREATININE 1.34* 1.50* 1.38*  CALCIUM 8.5*  --  8.2*    PT/INR: No results for input(s): LABPROT, INR in the last 72 hours. ABG    Component Value Date/Time   PHART 7.399 06/22/2016 0352   HCO3 21.1 06/22/2016 0352   TCO2 22 06/21/2016 1546   ACIDBASEDEF 2.9 (H) 06/22/2016 0352   O2SAT 79.3 06/22/2016 0410   CBG (last 3)   Recent Labs  06/21/16 2321 06/22/16 0313 06/22/16 0741  GLUCAP 209* 166* 158*     Assessment/Plan: S/P type B dissection Keep NG tube to suction BP control prealbumin15- no TPN due to increasing LFT's Antibiotic for RLL infiltrate Vascular surgery eval- doubt pt would benefit from  tevar   LOS: 2 days    [name] 06/22/2016

## 2016-06-22 NOTE — Consult Note (Signed)
Reason for  Consult:ischemic bowel Referring Physician: Dr. Nils Pyle  Haley Graham is an 61 y.o. female.  HPI: Patient is a 61 year old female who was admitted 2 days ago with abdominal pain/chest pain. Patient underwent evaluation and was found to have a AAA. Patient had a history of previous bilateral lower quadrant abdominal pain. She also has a history of gastroparesis.  Upon patient's laboratory workup it appeared that she was acidotic, and elevated lipase, AST/ALT which was likely due to the patient's brief acidosis. Since that time these numbers have declined. Patient currently has a base deficit of 1.  Patient states that prior to her admission for approximately 2-4 weeks she did have some nausea and vomiting with meals. She does have a remote history of testicular Reglan in the past For her her gastroparesis. She denies any diarrhea, blood per rectum, melena at any point prior to today.    Past Medical History:  Diagnosis Date  . Anxiety   . Arthritis   . Diabetes mellitus without complication ([place])   . Gastroparesis   . GERD (gastroesophageal reflux disease)   . Hypertension   . IBS (irritable bowel syndrome)     Past Surgical History:  Procedure Laterality Date  . ABDOMINAL HYSTERECTOMY    . CESAREAN SECTION    . CHOLECYSTECTOMY    . COLONOSCOPY    . COLONOSCOPY N/A 06/10/2016   Procedure: COLONOSCOPY;  Surgeon: Rogene Houston, MD;  [facility];  Service: Endoscopy;  Laterality: N/A;  8:30  . POLYPECTOMY  06/10/2016   Procedure: POLYPECTOMY;  Surgeon: Rogene Houston, MD;  Location: [facility];  Service: Endoscopy;;  multiple colon  . UPPER GASTROINTESTINAL ENDOSCOPY      No family history on file.  Social History:  reports that she has been smoking Cigarettes.  She has a 17.25 pack-year smoking history. She has never used smokeless tobacco. She reports that she does not drink alcohol or use drugs.  Allergies:  Allergies  Allergen Reactions  .  Codeine Nausea And Vomiting  . Reglan [Metoclopramide] Other (See Comments)    Heart races  . Penicillins Rash    Has patient had a PCN reaction causing immediate rash, facial/tongue/throat swelling, SOB or lightheadedness with hypotension: yes  Has patient had a PCN reaction causing severe rash involving mucus membranes or skin necrosis: no Has patient had a PCN reaction that required hospitalization no Has patient had a PCN reaction occurring within the last 10 years:no If all of the above answers are "NO", then may proceed with Cephalosporin use.    Medications: I have reviewed the patient's current medications.  Results for orders placed or performed during the hospital encounter of 06/20/16 (from the past 48 hour(s))  I-STAT 3, arterial blood gas (G3+)     Status: Abnormal   Collection Time: 06/20/16  5:03 PM  Result Value Ref Range   pH, Arterial 7.204 (L) 7.350 - 7.450   pCO2 arterial 41.0 32.0 - 48.0 mmHg   pO2, Arterial 63.0 (L) 83.0 - 108.0 mmHg   Bicarbonate 16.3 (L) 20.0 - 28.0 mmol/L   TCO2 18 0 - 100 mmol/L   O2 Saturation 87.0 %   Acid-base deficit 11.0 (H) 0.0 - 2.0 mmol/L   Patient temperature 97.6 F    Collection site ARTERIAL LINE    Drawn by Operator    Sample type ARTERIAL   Lactic acid, plasma     Status: Abnormal   Collection Time: 06/20/16  6:30  PM  Result Value Ref Range   Lactic Acid, Venous 4.6 (HH) 0.5 - 1.9 mmol/L    Comment: CRITICAL RESULT CALLED TO, READ BACK BY AND VERIFIED WITH: R.SARINE,RN 06/20/16 '@2005'  BY V.WILKINS   HIV antibody (Routine Testing)     Status: None   Collection Time: 06/20/16  6:30 PM  Result Value Ref Range   HIV Screen 4th Generation wRfx Non Reactive Non Reactive    Comment: (NOTE) Performed At: [facility] [place], [place] [id number] Lindon Romp MD [id number]   Basic metabolic panel     Status: Abnormal   Collection Time: 06/20/16  6:30 PM  Result Value Ref Range   Sodium 135 135 -  145 mmol/L   Potassium 4.3 3.5 - 5.1 mmol/L   Chloride 100 (L) 101 - 111 mmol/L   CO2 22 22 - 32 mmol/L   Glucose, Bld 316 (H) 65 - 99 mg/dL   BUN 16 6 - 20 mg/dL   Creatinine, Ser 1.17 (H) 0.44 - 1.00 mg/dL   Calcium 8.4 (L) 8.9 - 10.3 mg/dL   GFR calc non Af Amer 50 (L) >60 mL/min   GFR calc Af Amer 58 (L) >60 mL/min    Comment: (NOTE) The eGFR has been calculated using the CKD EPI equation. This calculation has not been validated in all clinical situations. eGFR's persistently <60 mL/min signify possible Chronic Kidney Disease.    Anion gap 13 5 - 15  I-STAT 3, arterial blood gas (G3+)     Status: Abnormal   Collection Time: 06/20/16  7:00 PM  Result Value Ref Range   pH, Arterial 7.344 (L) 7.350 - 7.450   pCO2 arterial 46.9 32.0 - 48.0 mmHg   pO2, Arterial 75.0 (L) 83.0 - 108.0 mmHg   Bicarbonate 25.8 20.0 - 28.0 mmol/L   TCO2 27 0 - 100 mmol/L   O2 Saturation 95.0 %   Acid-base deficit 1.0 0.0 - 2.0 mmol/L   Patient temperature 97.3 F    Sample type ARTERIAL   Glucose, capillary     Status: Abnormal   Collection Time: 06/20/16  8:38 PM  Result Value Ref Range   Glucose-Capillary 291 (H) 65 - 99 mg/dL   Comment 1 Notify RN   Glucose, capillary     Status: Abnormal   Collection Time: 06/21/16 12:21 AM  Result Value Ref Range   Glucose-Capillary 306 (H) 65 - 99 mg/dL   Comment 1 Notify RN   Troponin I     Status: Abnormal   Collection Time: 06/21/16  3:28 AM  Result Value Ref Range   Troponin I 0.04 (HH) <0.03 ng/mL    Comment: CRITICAL RESULT CALLED TO, READ BACK BY AND VERIFIED WITH: SARINE,R RN 06/21/2016 0551 JORDANS   Comprehensive metabolic panel     Status: Abnormal   Collection Time: 06/21/16  3:28 AM  Result Value Ref Range   Sodium 136 135 - 145 mmol/L   Potassium 3.7 3.5 - 5.1 mmol/L   Chloride 101 101 - 111 mmol/L   CO2 22 22 - 32 mmol/L   Glucose, Bld 255 (H) 65 - 99 mg/dL   BUN 22 (H) 6 - 20 mg/dL   Creatinine, Ser 1.34 (H) 0.44 - 1.00 mg/dL    Calcium 8.5 (L) 8.9 - 10.3 mg/dL   Total Protein 6.5 6.5 - 8.1 g/dL   Albumin 3.2 (L) 3.5 - 5.0 g/dL   AST 147 (H) 15 - 41 U/L   ALT 116 (  H) 14 - 54 U/L   Alkaline Phosphatase 91 38 - 126 U/L   Total Bilirubin 0.6 0.3 - 1.2 mg/dL   GFR calc non Af Amer 42 (L) >60 mL/min   GFR calc Af Amer 49 (L) >60 mL/min    Comment: (NOTE) The eGFR has been calculated using the CKD EPI equation. This calculation has not been validated in all clinical situations. eGFR's persistently <60 mL/min signify possible Chronic Kidney Disease.    Anion gap 13 5 - 15  CBC     Status: Abnormal   Collection Time: 06/21/16  3:28 AM  Result Value Ref Range   WBC 21.5 (H) 4.0 - 10.5 K/uL   RBC 4.30 3.87 - 5.11 MIL/uL   Hemoglobin 14.2 12.0 - 15.0 g/dL   HCT 43.0 36.0 - 46.0 %   MCV 100.0 78.0 - 100.0 fL   MCH 33.0 26.0 - 34.0 pg   MCHC 33.0 30.0 - 36.0 g/dL   RDW 12.6 11.5 - 15.5 %   Platelets 185 150 - 400 K/uL  Lactic acid, plasma     Status: Abnormal   Collection Time: 06/21/16  3:28 AM  Result Value Ref Range   Lactic Acid, Venous 3.4 (HH) 0.5 - 1.9 mmol/L    Comment: CRITICAL RESULT CALLED TO, READ BACK BY AND VERIFIED WITH: SARINE,R RN 06/21/2016 0550 JORDANS   I-STAT 3, arterial blood gas (G3+)     Status: None   Collection Time: 06/21/16  3:50 AM  Result Value Ref Range   pH, Arterial 7.413 7.350 - 7.450   pCO2 arterial 39.8 32.0 - 48.0 mmHg   pO2, Arterial 104.0 83.0 - 108.0 mmHg   Bicarbonate 25.5 20.0 - 28.0 mmol/L   TCO2 27 0 - 100 mmol/L   O2 Saturation 98.0 %   Acid-Base Excess 1.0 0.0 - 2.0 mmol/L   Patient temperature 97.4 F    Collection site ARTERIAL LINE    Drawn by Operator    Sample type ARTERIAL   Glucose, capillary     Status: Abnormal   Collection Time: 06/21/16  3:53 AM  Result Value Ref Range   Glucose-Capillary 253 (H) 65 - 99 mg/dL   Comment 1 Capillary Specimen   Glucose, capillary     Status: Abnormal   Collection Time: 06/21/16  8:00 AM  Result Value Ref Range    Glucose-Capillary 258 (H) 65 - 99 mg/dL   Comment 1 Capillary Specimen    Comment 2 Notify RN   Amylase     Status: Abnormal   Collection Time: 06/21/16 10:19 AM  Result Value Ref Range   Amylase 461 (H) 28 - 100 U/L  Cooxemetry Panel (carboxy, met, total hgb, O2 sat)     Status: None   Collection Time: 06/21/16 10:25 AM  Result Value Ref Range   Total hemoglobin 14.6 12.0 - 16.0 g/dL   O2 Saturation 92.9 %   Carboxyhemoglobin 1.3 0.5 - 1.5 %   Methemoglobin 1.1 0.0 - 1.5 %  Lactic acid, plasma     Status: Abnormal   Collection Time: 06/21/16 11:52 AM  Result Value Ref Range   Lactic Acid, Venous 3.8 (HH) 0.5 - 1.9 mmol/L    Comment: CRITICAL RESULT CALLED TO, READ BACK BY AND VERIFIED WITH: RN BURNHAM,S AT [id number] MARTINB   Glucose, capillary     Status: Abnormal   Collection Time: 06/21/16 11:56 AM  Result Value Ref Range   Glucose-Capillary 249 (H) 65 - 99 mg/dL  I-STAT, chem 8     Status: Abnormal   Collection Time: 06/21/16  3:46 PM  Result Value Ref Range   Sodium 135 135 - 145 mmol/L   Potassium 3.7 3.5 - 5.1 mmol/L   Chloride 99 (L) 101 - 111 mmol/L   BUN 31 (H) 6 - 20 mg/dL   Creatinine, Ser 1.50 (H) 0.44 - 1.00 mg/dL   Glucose, Bld 228 (H) 65 - 99 mg/dL   Calcium, Ion 1.08 (L) 1.15 - 1.40 mmol/L   TCO2 22 0 - 100 mmol/L   Hemoglobin 16.0 (H) 12.0 - 15.0 g/dL   HCT 47.0 (H) 36.0 - 46.0 %  Glucose, capillary     Status: Abnormal   Collection Time: 06/21/16  7:27 PM  Result Value Ref Range   Glucose-Capillary 237 (H) 65 - 99 mg/dL   Comment 1 Capillary Specimen    Comment 2 Notify RN    Comment 3 Document in Chart   Glucose, capillary     Status: Abnormal   Collection Time: 06/21/16 11:21 PM  Result Value Ref Range   Glucose-Capillary 209 (H) 65 - 99 mg/dL   Comment 1 Capillary Specimen    Comment 2 Notify RN    Comment 3 Document in Chart   Glucose, capillary     Status: Abnormal   Collection Time: 06/22/16  3:13 AM  Result Value Ref Range    Glucose-Capillary 166 (H) 65 - 99 mg/dL   Comment 1 Capillary Specimen    Comment 2 Notify RN    Comment 3 Document in Chart   Comprehensive metabolic panel     Status: Abnormal   Collection Time: 06/22/16  3:51 AM  Result Value Ref Range   Sodium 134 (L) 135 - 145 mmol/L   Potassium 3.6 3.5 - 5.1 mmol/L   Chloride 97 (L) 101 - 111 mmol/L   CO2 22 22 - 32 mmol/L   Glucose, Bld 172 (H) 65 - 99 mg/dL   BUN 32 (H) 6 - 20 mg/dL   Creatinine, Ser 1.38 (H) 0.44 - 1.00 mg/dL   Calcium 8.2 (L) 8.9 - 10.3 mg/dL   Total Protein 5.6 (L) 6.5 - 8.1 g/dL   Albumin 3.0 (L) 3.5 - 5.0 g/dL   AST 486 (H) 15 - 41 U/L   ALT 535 (H) 14 - 54 U/L   Alkaline Phosphatase 93 38 - 126 U/L   Total Bilirubin 0.9 0.3 - 1.2 mg/dL   GFR calc non Af Amer 41 (L) >60 mL/min   GFR calc Af Amer 47 (L) >60 mL/min    Comment: (NOTE) The eGFR has been calculated using the CKD EPI equation. This calculation has not been validated in all clinical situations. eGFR's persistently <60 mL/min signify possible Chronic Kidney Disease.    Anion gap 15 5 - 15  CBC     Status: Abnormal   Collection Time: 06/22/16  3:51 AM  Result Value Ref Range   WBC 19.3 (H) 4.0 - 10.5 K/uL   RBC 4.39 3.87 - 5.11 MIL/uL   Hemoglobin 14.6 12.0 - 15.0 g/dL   HCT 43.2 36.0 - 46.0 %   MCV 98.4 78.0 - 100.0 fL   MCH 33.3 26.0 - 34.0 pg   MCHC 33.8 30.0 - 36.0 g/dL   RDW 12.5 11.5 - 15.5 %   Platelets 169 150 - 400 K/uL  Amylase     Status: Abnormal   Collection Time: 06/22/16  3:51 AM  Result Value Ref Range  Amylase 316 (H) 28 - 100 U/L  Prealbumin     Status: Abnormal   Collection Time: 06/22/16  3:51 AM  Result Value Ref Range   Prealbumin 14.8 (L) 18 - 38 mg/dL  Blood gas, arterial     Status: Abnormal   Collection Time: 06/22/16  3:52 AM  Result Value Ref Range   FIO2 50.00    Delivery systems HI FLOW NASAL CANNULA    pH, Arterial 7.399 7.350 - 7.450   pCO2 arterial 34.9 32.0 - 48.0 mmHg   pO2, Arterial 69.1 (L) 83.0 -  108.0 mmHg   Bicarbonate 21.1 20.0 - 28.0 mmol/L   Acid-base deficit 2.9 (H) 0.0 - 2.0 mmol/L   O2 Saturation 92.7 %   Patient temperature 98.6    Collection site A-LINE    Drawn by COLLECTED BY RT    Sample type ARTERIAL DRAW    Allens test (pass/fail) PASS PASS  Cooxemetry Panel (carboxy, met, total hgb, O2 sat)     Status: None   Collection Time: 06/22/16  4:10 AM  Result Value Ref Range   Total hemoglobin 15.1 12.0 - 16.0 g/dL   O2 Saturation 79.3 %   Carboxyhemoglobin 0.6 0.5 - 1.5 %   Methemoglobin 1.2 0.0 - 1.5 %  Glucose, capillary     Status: Abnormal   Collection Time: 06/22/16  7:41 AM  Result Value Ref Range   Glucose-Capillary 158 (H) 65 - 99 mg/dL   Comment 1 Notify RN   Lactic acid, plasma     Status: Abnormal   Collection Time: 06/22/16  8:45 AM  Result Value Ref Range   Lactic Acid, Venous 4.4 (HH) 0.5 - 1.9 mmol/L    Comment: CRITICAL RESULT CALLED TO, READ BACK BY AND VERIFIED WITH: J.WOODY,RN 0943 06/22/16 CLARK,S   Glucose, capillary     Status: Abnormal   Collection Time: 06/22/16 11:37 AM  Result Value Ref Range   Glucose-Capillary 180 (H) 65 - 99 mg/dL   Comment 1 Notify RN   I-STAT, chem 8     Status: Abnormal   Collection Time: 06/22/16  3:06 PM  Result Value Ref Range   Sodium 140 135 - 145 mmol/L   Potassium 3.4 (L) 3.5 - 5.1 mmol/L   Chloride 106 101 - 111 mmol/L   BUN 39 (H) 6 - 20 mg/dL   Creatinine, Ser 1.10 (H) 0.44 - 1.00 mg/dL   Glucose, Bld 117 (H) 65 - 99 mg/dL   Calcium, Ion 0.99 (L) 1.15 - 1.40 mmol/L   TCO2 21 0 - 100 mmol/L   Hemoglobin 10.5 (L) 12.0 - 15.0 g/dL   HCT 31.0 (L) 36.0 - 46.0 %  I-STAT 3, arterial blood gas (G3+)     Status: Abnormal   Collection Time: 06/22/16  3:10 PM  Result Value Ref Range   pH, Arterial 7.470 (H) 7.350 - 7.450   pCO2 arterial 30.5 (L) 32.0 - 48.0 mmHg   pO2, Arterial 78.0 (L) 83.0 - 108.0 mmHg   Bicarbonate 22.2 20.0 - 28.0 mmol/L   TCO2 23 0 - 100 mmol/L   O2 Saturation 97.0 %    Acid-base deficit 1.0 0.0 - 2.0 mmol/L   Patient temperature 98.1 F    Collection site ARTERIAL LINE    Drawn by Nurse    Sample type ARTERIAL     Dg Chest Port 1 View  Result Date: 06/22/2016 CLINICAL DATA:  61 year old female with history of abdominal aortic aneurysm. Shortness of breath and  vomiting. EXAM: PORTABLE CHEST 1 VIEW COMPARISON:  Chest x-ray 06/21/2016. FINDINGS: There is a right upper extremity PICC with tip terminating in the superior cavoatrial junction. A nasogastric tube is seen extending into the stomach, however, the tip of the nasogastric tube extends below the lower margin of the image. Lung volumes are low. Linear bibasilar opacities favored to reflect areas of subsegmental atelectasis, however, underlying airspace consolidation from infection or aspiration is not excluded. No pleural effusions. No evidence of pulmonary edema. Heart size is normal. Upper mediastinal contours are within normal limits. IMPRESSION: 1. Support apparatus, as above. 2. Low lung volumes with bibasilar areas of atelectasis and/or airspace consolidation. Electronically Signed   By: Vinnie Langton M.D.   On: 06/22/2016 07:30   Dg Chest Port 1 View  Result Date: 06/21/2016 CLINICAL DATA:  Aortic dissection EXAM: PORTABLE CHEST 1 VIEW COMPARISON:  06/20/2016 FINDINGS: Bibasilar atelectasis. Heart is normal size. No effusions or acute bony abnormality. IMPRESSION: Bibasilar atelectasis. Electronically Signed   By: Rolm Baptise M.D.   On: 06/21/2016 07:18   Dg Abd Portable 1v  Result Date: 06/22/2016 CLINICAL DATA:  Vomiting.  Shortness of breath.  AAA . EXAM: PORTABLE ABDOMEN - 1 VIEW COMPARISON:  06/21/2016. FINDINGS: Surgical clips right upper quadrant. NG tube noted with tip over the distal stomach. No bowel distention. No acute bony abnormality identified. Mild basilar atelectasis. IMPRESSION: NG tube noted with its tip over the distal stomach. No bowel distention or acute intra-abdominal  abnormality identified. Electronically Signed   By: Marcello Moores  Register   On: 06/22/2016 07:30   Dg Abd Portable 1v  Result Date: 06/21/2016 CLINICAL DATA:  Feeding tube placement EXAM: PORTABLE ABDOMEN - 1 VIEW COMPARISON:  06/21/2016 FINDINGS: NG tube coils in the fundus of the stomach with the tip in the distal stomach. Decompression of the stomach. IMPRESSION: NG tube tip in the distal stomach. Electronically Signed   By: Rolm Baptise M.D.   On: 06/21/2016 15:46   Dg Abd Portable 1v  Result Date: 06/21/2016 CLINICAL DATA:  Ileus  Vomiting that started this AM per patient EXAM: PORTABLE ABDOMEN - 1 VIEW COMPARISON:  06/20/2016 FINDINGS: Gaseous distention of the stomach. Paucity of small bowel and colonic gas. Cholecystectomy clips. Linear scarring/ atelectasis in the lung bases. IMPRESSION: 1. Gaseous distention of the stomach. Electronically Signed   By: Lucrezia Europe M.D.   On: 06/21/2016 11:54    Review of Systems  Constitutional: Negative for chills, fever and weight loss.  HENT: Negative for ear discharge, ear pain, hearing loss, nosebleeds and tinnitus.   Eyes: Negative for blurred vision, double vision, photophobia and pain.  Respiratory: Negative for cough, hemoptysis, sputum production, shortness of breath and wheezing.   Cardiovascular: Positive for chest pain. Negative for palpitations, orthopnea, claudication and leg swelling.  Gastrointestinal: Positive for abdominal pain, nausea and vomiting. Negative for constipation, diarrhea and heartburn.  Musculoskeletal: Negative for back pain, joint pain, myalgias and neck pain.  Skin: Negative for itching and rash.  Neurological: Negative for dizziness, tingling, sensory change and headaches.  Psychiatric/Behavioral: Negative for depression, hallucinations, substance abuse and suicidal ideas.   Blood pressure (!) 116/52, pulse (!) 115, temperature 98.1 F (36.7 C), temperature source Oral, resp. rate (!) 22, height '5\' 3"'  (1.6 m), weight  82.6 kg (182 lb 1.6 oz), SpO2 99 %. Physical Exam  Constitutional: She is oriented to person, place, and time. She appears well-developed and well-nourished. No distress.  HENT:  Head: Normocephalic and atraumatic.  Right  Ear: External ear normal.  Left Ear: External ear normal.  Mouth/Throat: Oropharynx is clear and moist. No oropharyngeal exudate.  Eyes: Conjunctivae and EOM are normal. Pupils are equal, round, and reactive to light. Right eye exhibits no discharge. Left eye exhibits no discharge. No scleral icterus.  Neck: Normal range of motion. Neck supple. No tracheal deviation present. No thyromegaly present.  Cardiovascular: Regular rhythm and intact distal pulses.  Exam reveals friction rub. Exam reveals no gallop.   No murmur heard. Respiratory: Effort normal and breath sounds normal. No stridor. No respiratory distress. She has no wheezes. She has no rales. She exhibits no tenderness.  GI: Soft. Bowel sounds are normal. She exhibits no distension and no mass. There is tenderness (BLQ, R>L). There is no rebound and no guarding.  Musculoskeletal: Normal range of motion. She exhibits no edema, tenderness or deformity.  Neurological: She is alert and oriented to person, place, and time.  Skin: Skin is warm and dry. No rash noted. She is not diaphoretic. No erythema. No pallor.  Psychiatric: She has a normal mood and affect. Her behavior is normal. Thought content normal.    Assessment/Plan: 61 year old female with new AAA. Currently being managed medically. At this time and nothing the patient has any ischemic bowel. Several laboratory measures have reversed, and are declining. Based on the patient's KUBs it appears that she is having some propagation of gas throughout her GI tract..  1. We will continue with serial abdominal exams. 2. Discussed with patient, her husband, her daughter over the phone and we will continue to examine her, if any changes to her abdominal exam is  concerning for ischemic bowel and she likely need surgery at that time to further assess. 3. We will follow along  Haley Jacks., Haley Graham 06/22/2016, 4:56 PM

## 2016-06-22 NOTE — Progress Notes (Signed)
Patient ID: Haley Graham, female   DOB: 21-Mar-1955, 61 y.o.   MRN: [id number] EVENING ROUNDS NOTE :     [address] [place]       [place] [address]             [id number]                     Total Length of Stay:  LOS: 2 days  BP (!) 161/57   Pulse (!) 115   Temp 98.1 F (36.7 C) (Oral)   Resp (!) 22   Ht 5\' 3"  (1.6 m)   Wt 182 lb 1.6 oz (82.6 kg)   SpO2 99%   BMI 32.26 kg/m   .Intake/Output      05/20 0701 - 05/21 0700 05/21 0701 - 05/22 0700   P.O.     I.V. (mL/kg) 2220.6 (26.9) 937 (11.3)   NG/GT 30    IV Piggyback 500 375   Total Intake(mL/kg) 2750.6 (33.3) 1312 (15.9)   Urine (mL/kg/hr) 915 (0.5) 620 (0.6)   Emesis/NG output 1500 (0.8) 350 (0.4)   Total Output 2415 970   Net +335.6 +342          . sodium chloride 75 mL/hr at 06/22/16 1700  . sodium chloride    . azithromycin Stopped (06/22/16 1043)  . clindamycin (CLEOCIN) IV    . esmolol 20 mcg/kg/min (06/22/16 1700)  . nitroPRUSSide Stopped (06/22/16 1651)     Lab Results  Component Value Date   WBC 19.3 (H) 06/22/2016   HGB 10.5 (L) 06/22/2016   HCT 31.0 (L) 06/22/2016   PLT 169 06/22/2016   GLUCOSE 117 (H) 06/22/2016   ALT 535 (H) 06/22/2016   AST 486 (H) 06/22/2016   NA 140 06/22/2016   K 3.4 (L) 06/22/2016   CL 106 06/22/2016   CREATININE 1.10 (H) 06/22/2016   BUN 39 (H) 06/22/2016   CO2 22 06/22/2016   Still with abdominal pain , just seen by General surgery , keep npo with NG    [name]  Beeper [phone] Office [phone] 06/22/2016 6:43 PM

## 2016-06-22 NOTE — Consult Note (Signed)
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 female, who is seen in consultation for descending thoracic aortic dissection. She was admitted on 06/20/2016. She been in her usual state of health when she had the sudden onset of chest and abdominal pain around 7 AM. CT scan at [facility] revealed dissection just distal to her left subclavian takeoff. This extended down through her iliac vessels. Had severe hypertension and was admitted for blood pressure control and observation. She has had persistent abdominal discomfort. She did have chronic abdominal pain but reports that this is different than her usual. She is alert and oriented this morning but is uncomfortable overall. Does not have any chest pain currently. She is requiring medication for a blood pressure control with ongoing hypertension.  Past Medical History:  Diagnosis Date  . Anxiety   . Arthritis   . Diabetes mellitus without complication ([place])   . Gastroparesis   . GERD (gastroesophageal reflux disease)   . Hypertension   . IBS (irritable bowel syndrome)     No family history on file.  SOCIAL HISTORY: Social History   Social History  . Marital status: Married    Spouse name: N/A  . Number of children: N/A  . Years of education: N/A   Occupational History  . Not on file.   Social History Main Topics  . Smoking status: Current Every Day Smoker    Packs/day: 0.75    Years: 23.00    Types: Cigarettes  . Smokeless tobacco: Never Used     Comment: 5-6 cigarettes. Cut back about 3 weeks ago. Smoking since age 7.  Marland Kitchen Alcohol use No  . Drug use: No  . Sexual activity: Not on file   Other Topics Concern  . Not on file   Social History Narrative  . No narrative on file    Allergies  Allergen  Reactions  . Codeine Nausea And Vomiting  . Reglan [Metoclopramide] Other (See Comments)    Heart races  . Penicillins Rash    Has patient had a PCN reaction causing immediate rash, facial/tongue/throat swelling, SOB or lightheadedness with hypotension: yes  Has patient had a PCN reaction causing severe rash involving mucus membranes or skin necrosis: no Has patient had a PCN reaction that required hospitalization no Has patient had a PCN reaction occurring within the last 10 years:no If all of the above answers are "NO", then may proceed with Cephalosporin use.    Current Facility-Administered Medications  Medication Dose Route Frequency Provider Last Rate Last Dose  . 0.9 %  sodium chloride infusion   Intravenous Continuous Ivin Poot, MD 75 mL/hr at 06/22/16 1100    . 0.9 %  sodium chloride infusion   Intra-arterial PRN Prescott Gum, Collier Salina, MD      . acetaminophen (TYLENOL) tablet 650 mg  650 mg Oral Q6H PRN Prescott Gum, Collier Salina, MD       Or  . acetaminophen (TYLENOL) suppository 650 mg  650 mg Rectal Q6H PRN Prescott Gum, Collier Salina, MD      . albumin human 5 % solution 12.5 g  12.5 g Intravenous Q6H Ivin Poot, MD 75 mL/hr at 06/22/16 0827 12.5 g at 06/22/16 0827  . albuterol (PROVENTIL) (2.5 MG/3ML) 0.083% nebulizer solution 2.5 mg  2.5 mg Nebulization Q6H PRN Prescott Gum,  Collier Salina, MD      . albuterol (PROVENTIL) (2.5 MG/3ML) 0.083% nebulizer solution 2.5 mg  2.5 mg Inhalation Q6H Prescott Gum, Collier Salina, MD   2.5 mg at 06/22/16 0749  . ALPRAZolam Duanne Moron) tablet 0.5 mg  0.5 mg Oral TID PRN Prescott Gum, Collier Salina, MD   0.5 mg at 06/21/16 1032  . azithromycin (ZITHROMAX) 250 mg in dextrose 5 % 125 mL IVPB  250 mg Intravenous Q24H Ivin Poot, MD   Stopped at 06/22/16 1043  . chlorhexidine (PERIDEX) 0.12 % solution 15 mL  15 mL Mouth Rinse BID Prescott Gum, Collier Salina, MD   15 mL at 06/22/16 0948  . Chlorhexidine Gluconate Cloth 2 % PADS 6 each  6 each Topical Daily Ivin Poot, MD   6 each at 06/22/16  1000  . citalopram (CELEXA) tablet 20 mg  20 mg Oral Daily Prescott Gum, Collier Salina, MD      . cloNIDine (CATAPRES - Dosed in mg/24 hr) patch 0.2 mg  0.2 mg Transdermal Weekly Prescott Gum, Collier Salina, MD   0.2 mg at 06/20/16 1500  . esmolol (BREVIBLOC) 2000 mg / 100 mL (20 mg/mL) infusion  50-300 mcg/kg/min Intravenous Continuous Prescott Gum, Collier Salina, MD 4.9 mL/hr at 06/22/16 1100 20 mcg/kg/min at 06/22/16 1100  . fentaNYL (DURAGESIC - dosed mcg/hr) patch 25 mcg  25 mcg Transdermal Q72H Prescott Gum, Collier Salina, MD   25 mcg at 06/21/16 1003  . guaiFENesin (MUCINEX) 12 hr tablet 600 mg  600 mg Oral BID Prescott Gum, Collier Salina, MD   600 mg at 06/21/16 0916  . hydrALAZINE (APRESOLINE) injection 10 mg  10 mg Intravenous Q4H PRN Ivin Poot, MD   10 mg at 06/20/16 2310  . HYDROmorphone (DILAUDID) injection 1 mg  1 mg Intravenous Q4H PRN Ivin Poot, MD   1 mg at 06/22/16 0956  . insulin aspart (novoLOG) injection 0-24 Units  0-24 Units Subcutaneous Q4H Ivin Poot, MD   2 Units at 06/22/16 [phone]  . insulin glargine (LANTUS) injection 24 Units  24 Units Subcutaneous BID Ivin Poot, MD   24 Units at 06/22/16 [phone]  . MEDLINE mouth rinse  15 mL Mouth Rinse q12n4p Prescott Gum, Collier Salina, MD   15 mL at 06/21/16 1600  . metoprolol tartrate (LOPRESSOR) injection 5 mg  5 mg Intravenous Q4H Ivin Poot, MD   5 mg at 06/22/16 1228  . midazolam (VERSED) injection 2 mg  2 mg Intravenous Q2H PRN Ivin Poot, MD   2 mg at 06/21/16 1311  . mometasone-formoterol (DULERA) 200-5 MCG/ACT inhaler 2 puff  2 puff Inhalation BID Prescott Gum, Collier Salina, MD   2 puff at 06/22/16 0751  . multivitamin with minerals tablet 1 tablet  1 tablet Oral Daily Prescott Gum, Collier Salina, MD   1 tablet at 06/21/16 [phone]  . nicotine (NICODERM CQ - dosed in mg/24 hours) patch 14 mg  14 mg Transdermal Daily Prescott Gum, Collier Salina, MD   14 mg at 06/22/16 0947  . nitroPRUSSide (NIPRIDE) 50 mg in dextrose 5 % 250 mL (0.2 mg/mL) infusion  0-2 mcg/kg/min Intravenous Titrated Prescott Gum, Collier Salina, MD 17.6 mL/hr at 06/22/16 1100 0.7 mcg/kg/min at 06/22/16 1100  . ondansetron (ZOFRAN) injection 4 mg  4 mg Intravenous Q6H PRN Ivin Poot, MD   4 mg at 06/21/16 1022  . pantoprazole (PROTONIX) injection 40 mg  40 mg Intravenous Q24H Prescott Gum, Collier Salina, MD      . pneumococcal 23 valent vaccine (PNU-IMMUNE) injection 0.5  mL  0.5 mL Intramuscular Prior to discharge Prescott Gum, Collier Salina, MD      . promethazine [facility]) injection 6.25 mg  6.25 mg Intravenous Q6H PRN Prescott Gum, Collier Salina, MD   6.25 mg at 06/22/16 0753  . simethicone (MYLICON) chewable tablet 80 mg  80 mg Oral QID PRN Ivin Poot, MD   80 mg at 06/21/16 0915  . sodium chloride flush (NS) 0.9 % injection 10-40 mL  10-40 mL Intracatheter Q12H Ivin Poot, MD   20 mL at 06/21/16 2155  . sodium chloride flush (NS) 0.9 % injection 10-40 mL  10-40 mL Intracatheter PRN Prescott Gum, Collier Salina, MD      . traMADol Veatrice Bourbon) tablet 50 mg  50 mg Oral Q6H PRN Ivin Poot, MD        REVIEW OF SYSTEMS:  Reviewed in her chart with nothing to add    PHYSICAL EXAM: Vitals:   06/22/16 1130 06/22/16 1145 06/22/16 1200 06/22/16 1215  BP: (!) 131/59 136/61 (!) 133/59 (!) 137/57  Pulse: (!) 110 (!) 112 (!) 113 (!) 112  Resp: (!) 23 (!) 22 (!) 22 (!) 21  Temp:      TempSrc:      SpO2: 99% 100% 99% 99%  Weight:      Height:        GENERAL: The patient is a well-nourished female, in no acute distress. The vital signs are documented above. CARDIOVASCULAR: 2+ radial and 2+ femoral and dorsalis pedis pulses bilaterally PULMONARY: There is good air exchange  ABDOMEN: Diffusely tender more so in her right lower quadrant MUSCULOSKELETAL: There are no major deformities or cyanosis. NEUROLOGIC: No focal weakness or paresthesias are detected. SKIN: There are no ulcers or rashes noted. PSYCHIATRIC: The patient has a normal affect.  DATA:  Reviewed her CT scan images and discuss them with the patient. This does show degenerative  dissection of her thoracic ordered distal to her left subclavian takeoff. The superior mesenteric and celiac vessels are supplied by her true lumen. She has diminished flow to her left kidney resulting from the dissection. She has occlusion of her true lumen below the SMA takeoff. Her IMA is occluded at its origin but reconstitutes quickly by collaterals. He has a normal flow through her iliacs off the false lumen.  MEDICAL ISSUES: Acute descending thoracic aortic dissection 48 hours ago. Her concern is of her abdominal pain and discomfort. Does not have any correctable mesenteric flow issues. She has totally normal flow to her celiac and SMA via the true lumen and has short segment occlusion of her IMA.Marland Kitchen Has normal flow to her lower extremities bilaterally. Agree with bowel rest and NG suction. Acidosis at length with patient regarding concern for ischemia to her gut. White count elevated at 19,000 and lactic acid 4.4.   Rosetta Posner, MD FACS [facility] and Vein Specialists of [facility] Tel [phone] Pager [phone]

## 2016-06-22 NOTE — Care Management Note (Signed)
Case Management Note Marvetta Gibbons RN, BSN Unit 2W-Case Manager-- [employer] coverage [phone]  Patient Details  Name: [name] MRN: [id number] Date of Birth: 01-Nov-1955  Subjective/Objective:  Pt admitted with Stanford type B aortic dissection-- With pancreatitis and ileus after Stanford type B dissection- has NGT in place                  Action/Plan: PTA pt lived at home with spouse- referral for FMLA paperwork needed by daughter- have referred her to her mother's primary care doctor regarding this- CM to follow for d/c needs  Expected Discharge Date:                  Expected Discharge Plan:  Home/Self Care  In-House Referral:     Discharge planning Services  CM Consult  Post Acute Care Choice:    Choice offered to:     DME Arranged:    DME Agency:     HH Arranged:    [place] Agency:     Status of Service:  In process, will continue to follow  If discussed at Long Length of Stay Meetings, dates discussed:    Discharge Disposition:   Additional Comments:  Dawayne Patricia, RN 06/22/2016, 12:29 PM

## 2016-06-22 NOTE — Progress Notes (Signed)
Patient ID: Haley Graham, female   DOB: 17-Jun-1955, 61 y.o.   MRN: [id number] Resting Abd pain about the same Abd soft but tender B lower quadrants, no peritonitis Will follow exam and labs closely I spoke with her family.  Georganna Skeans, MD, MPH, FACS Trauma: [phone] General Surgery: [phone]

## 2016-06-23 ENCOUNTER — Inpatient Hospital Stay (HOSPITAL_COMMUNITY): Payer: BLUE CROSS/BLUE SHIELD

## 2016-06-23 LAB — COMPREHENSIVE METABOLIC PANEL
ALT: 526 U/L — ABNORMAL HIGH (ref 14–54)
AST: 318 U/L — ABNORMAL HIGH (ref 15–41)
Albumin: 2.8 g/dL — ABNORMAL LOW (ref 3.5–5.0)
Alkaline Phosphatase: 137 U/L — ABNORMAL HIGH (ref 38–126)
Anion gap: 11 (ref 5–15)
BUN: 33 mg/dL — ABNORMAL HIGH (ref 6–20)
CO2: 22 mmol/L (ref 22–32)
Calcium: 8.5 mg/dL — ABNORMAL LOW (ref 8.9–10.3)
Chloride: 103 mmol/L (ref 101–111)
Creatinine, Ser: 1.46 mg/dL — ABNORMAL HIGH (ref 0.44–1.00)
GFR calc Af Amer: 44 mL/min — ABNORMAL LOW (ref >60)
GFR calc non Af Amer: 38 mL/min — ABNORMAL LOW (ref >60)
Glucose, Bld: 121 mg/dL — ABNORMAL HIGH (ref 65–99)
Potassium: 4 mmol/L (ref 3.5–5.1)
Sodium: 136 mmol/L (ref 135–145)
Total Bilirubin: 1.6 mg/dL — ABNORMAL HIGH (ref 0.3–1.2)
Total Protein: 5.4 g/dL — ABNORMAL LOW (ref 6.5–8.1)

## 2016-06-23 LAB — GLUCOSE, CAPILLARY
Glucose-Capillary: 116 mg/dL — ABNORMAL HIGH (ref 65–99)
Glucose-Capillary: 134 mg/dL — ABNORMAL HIGH (ref 65–99)
Glucose-Capillary: 139 mg/dL — ABNORMAL HIGH (ref 65–99)
Glucose-Capillary: 179 mg/dL — ABNORMAL HIGH (ref 65–99)

## 2016-06-23 LAB — POCT I-STAT, CHEM 8
BUN: 35 mg/dL — ABNORMAL HIGH (ref 6–20)
Calcium, Ion: 1.16 mmol/L (ref 1.15–1.40)
Chloride: 103 mmol/L (ref 101–111)
Creatinine, Ser: 1.5 mg/dL — ABNORMAL HIGH (ref 0.44–1.00)
Glucose, Bld: 121 mg/dL — ABNORMAL HIGH (ref 65–99)
HCT: 36 % (ref 36.0–46.0)
Hemoglobin: 12.2 g/dL (ref 12.0–15.0)
Potassium: 3.6 mmol/L (ref 3.5–5.1)
Sodium: 141 mmol/L (ref 135–145)
TCO2: 23 mmol/L (ref 0–100)

## 2016-06-23 LAB — POCT I-STAT 3, ART BLOOD GAS (G3+)
Acid-Base Excess: 2 mmol/L (ref 0.0–2.0)
Bicarbonate: 24.4 mmol/L (ref 20.0–28.0)
O2 Saturation: 99 %
TCO2: 25 mmol/L (ref 0–100)
pCO2 arterial: 31.1 mmHg — ABNORMAL LOW (ref 32.0–48.0)
pH, Arterial: 7.502 — ABNORMAL HIGH (ref 7.350–7.450)
pO2, Arterial: 103 mmHg (ref 83.0–108.0)

## 2016-06-23 LAB — CBC
HCT: 40 % (ref 36.0–46.0)
Hemoglobin: 13.4 g/dL (ref 12.0–15.0)
MCH: 33.2 pg (ref 26.0–34.0)
MCHC: 33.5 g/dL (ref 30.0–36.0)
MCV: 99 fL (ref 78.0–100.0)
Platelets: 114 10*3/uL — ABNORMAL LOW (ref 150–400)
RBC: 4.04 MIL/uL (ref 3.87–5.11)
RDW: 12.9 % (ref 11.5–15.5)
WBC: 12.1 10*3/uL — ABNORMAL HIGH (ref 4.0–10.5)

## 2016-06-23 LAB — COOXEMETRY PANEL
Carboxyhemoglobin: 1.3 % (ref 0.5–1.5)
Methemoglobin: 0.9 % (ref 0.0–1.5)
O2 Saturation: 85 %
Total hemoglobin: 12.9 g/dL (ref 12.0–16.0)

## 2016-06-23 LAB — AMYLASE: Amylase: 185 U/L — ABNORMAL HIGH (ref 28–100)

## 2016-06-23 LAB — PHOSPHORUS: Phosphorus: 3.1 mg/dL (ref 2.5–4.6)

## 2016-06-23 LAB — URINE CULTURE: Culture: NO GROWTH

## 2016-06-23 LAB — CALCIUM, IONIZED
Calcium, Ionized, Serum: 4.5 mg/dL (ref 4.5–5.6)
Calcium, Ionized, Serum: 4.5 mg/dL (ref 4.5–5.6)

## 2016-06-23 LAB — THIOCYANATE, BLOOD: Thiocyanate: 1.1 mg/dl

## 2016-06-23 LAB — MAGNESIUM: Magnesium: 1.4 mg/dL — ABNORMAL LOW (ref 1.7–2.4)

## 2016-06-23 LAB — LACTIC ACID, PLASMA: Lactic Acid, Venous: 2.9 mmol/L (ref 0.5–1.9)

## 2016-06-23 MED ORDER — ALBUMIN HUMAN 5 % IV SOLN
12.5000 g | Freq: Four times a day (QID) | INTRAVENOUS | Status: AC
  Administered 2016-06-23 (×2): 12.5 g via INTRAVENOUS
  Filled 2016-06-23 (×2): qty 250

## 2016-06-23 MED ORDER — SODIUM CHLORIDE 0.9 % IV SOLN: INTRAVENOUS | Status: AC

## 2016-06-23 MED ORDER — POTASSIUM CHLORIDE 10 MEQ/50ML IV SOLN
10.0000 meq | INTRAVENOUS | Status: AC
  Administered 2016-06-23 (×3): 10 meq via INTRAVENOUS
  Filled 2016-06-23: qty 50

## 2016-06-23 MED ORDER — TRAVASOL 10 % IV SOLN: INTRAVENOUS | Status: DC

## 2016-06-23 MED ORDER — SODIUM CHLORIDE 0.9 % IV SOLN
INTRAVENOUS | Status: DC
  Administered 2016-06-24: 35 mL/h via INTRAVENOUS
  Administered 2016-06-26: 04:00:00 via INTRAVENOUS

## 2016-06-23 MED ORDER — MAGNESIUM SULFATE 2 GM/50ML IV SOLN
2.0000 g | Freq: Once | INTRAVENOUS | Status: AC
  Administered 2016-06-23: 2 g via INTRAVENOUS
  Filled 2016-06-23: qty 50

## 2016-06-23 MED ORDER — CHLORHEXIDINE GLUCONATE 0.12 % MT SOLN
15.0000 mL | Freq: Two times a day (BID) | OROMUCOSAL | Status: DC
  Administered 2016-06-23 – 2016-08-02 (×73): 15 mL via OROMUCOSAL
  Filled 2016-06-23 (×56): qty 15

## 2016-06-23 MED ORDER — TRACE MINERALS CR-CU-MN-SE-ZN 10-1000-500-60 MCG/ML IV SOLN
INTRAVENOUS | Status: AC
  Administered 2016-06-23: 18:00:00 via INTRAVENOUS
  Filled 2016-06-23: qty 720

## 2016-06-23 MED ORDER — ORAL CARE MOUTH RINSE
15.0000 mL | Freq: Two times a day (BID) | OROMUCOSAL | Status: DC
  Administered 2016-06-24 – 2016-08-02 (×64): 15 mL via OROMUCOSAL

## 2016-06-23 MED ORDER — FAT EMULSION 20 % IV EMUL
120.0000 mL | INTRAVENOUS | Status: AC
  Administered 2016-06-23: 120 mL via INTRAVENOUS
  Filled 2016-06-23: qty 200

## 2016-06-23 NOTE — Progress Notes (Signed)
Patient ID: Haley Graham, female   DOB: Mar 13, 1955, 61 y.o.   MRN: [id number]  SICU Evening Rounds:  Hemodynamically stable. Esmolol is off.  Some abdominal pain today, received dilaudid twice. NPO  Urine output good but creat up a little.  BMET    Component Value Date/Time   NA 141 06/23/2016 1608   K 3.6 06/23/2016 1608   CL 103 06/23/2016 1608   CO2 22 06/23/2016 0411   GLUCOSE 121 (H) 06/23/2016 1608   BUN 35 (H) 06/23/2016 1608   CREATININE 1.50 (H) 06/23/2016 1608   CALCIUM 8.5 (L) 06/23/2016 0411   GFRNONAA 38 (L) 06/23/2016 0411   GFRAA 44 (L) 06/23/2016 0411    Continue BP control and observation.

## 2016-06-23 NOTE — Progress Notes (Signed)
PHARMACY - ADULT TOTAL PARENTERAL NUTRITION CONSULT NOTE   Pharmacy Consult for TPN Indication: prolonged NPO status/high NG output   Patient Measurements: Height: _0  (160 cm) Weight: 184 lb 4.9 oz (83.6 kg) IBW/kg (Calculated) : 52.4 TPN AdjBW (KG): 60.3 Body mass index is 32.65 kg/m. 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 Patient had a significant hx of N/V for 2-4 weeks prior to admission. Weight appears to have been maintained. Patient had a NG tube placed 5/20 which has has significant output with 1-1.5 L a day.   GI: NG tube in place for drainage (1.1L out 5/21). Patient has flatus. Abdominal pain may be improving. Prealbumin-14.8 and albumin 2.8. Phenergan Q 6 prn.  Endo: Hx of DM. No insulin at home. CBGs 120-180 on 48 units of lantus and SSI.  Insulin requirements in the past 24 hours: 14 units of aspart; 48 units of lantus  Lytes: Na-136, K-4.0, CoCa-9.2, Mg- 1.4, Phos-3.1 Renal:AKI: Scr-1.10>>1.46. BUN-33. NS @ 75 ml/hr  Pulm: 6 L HFNC ; Dulera Cards: Stanfod type B aortic dissection; BP WNL, HR -90s-100s in ST. Esmolol gtt , lopressor Q 4, clonidine 0.2 mg patch . Nitroprusside off.  Hepatobil:AST/ALT elevated 318/526. Alk phos-137. Tbili-1.6 Neuro:Citalopram; Hydromorphone prn ID: D# [id number] Azith/Clinda for possible RLL pneumonia/colonic ischemia. WBC down to 12.1. Afebrile. Lactate 4.4>>2.9. 5/21 BCx and UCx in process.   Best Practices:PPI TPN Access:PICC line placed 5/20 TPN start date:5/22  Nutritional Goals (per RD recommendation on (awaiting recs): KCal: 1505- 1810 kcal/day  Protein: 85-95 gm/day Goal TPN rate: Clinimix 5/15 E @ 75 ml/hr + lipid emulsion 20 ml/hr (Provides 1758 kcal and 90 gm of protein)  Current Nutrition:  TPN  Plan:  Initiate Clinimix E 5/15 at 30 ml/hr Initiate 20% lipid emulsion at 10 ml/hr Decrease IVMF NS to 35 ml/hr  This provides 36 g of  protein and  751 kCals per day meeting 42 % of protein and 50 % of kCal needs Daily MVI in TPN Trace Elements every other day - Next due 5/22 Continue SSI and adjust as needed  Monitor TPN labs, CMET, and Mg and Phos  Give 2 gm magnesium sulfate   Ihor Austin, PharmD PGY1 Pharmacy Resident Pager: [phone] 06/23/2016 9:10 AM

## 2016-06-23 NOTE — Progress Notes (Signed)
Pt con't with unchanged abdominal pain. VSS No bowel function, NGT bilious  Will con't with serial abdominal exams

## 2016-06-23 NOTE — Plan of Care (Signed)
Problem: Pain Managment: Goal: General experience of comfort will improve Outcome: Progressing When patient is in pain, the PRN dilaudid ordered has been able to give her adequate pain relief.   Problem: Activity: Goal: Risk for activity intolerance will decrease Outcome: Progressing Pt was able to sit up in chair today for the first time since admission and tolerated it well.  Problem: Nutrition: Goal: Adequate nutrition will be maintained Outcome: Progressing Pt was started on TPN to help reach nutritional goals.  Problem: Bowel/Gastric: Goal: Will not experience complications related to bowel motility Outcome: Not Progressing Pts abdomen is distended with very faint bowel sounds and not passing gas.

## 2016-06-23 NOTE — Progress Notes (Signed)
  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 Post dissection elevated LFTs, elevated amylase and elevated white count  The patient's abdominal distention is improved with NG tube. She still has lower abdominal tenderness without guarding. Her acidosis is improved LFTs are plateaued, amylase is improved, WBC is improved.  Blood cultures are negative Patient on IV azithromycin and clindamycin-allergy to penicillin  Blood pressure is improved and she is now weaned off nitroprusside on a lower dose of labetalol, scheduled IV Lopressor and Catapres patch Objec,tive: Vital signs in last 24 hours: Temp:  [97.5 F (36.4 C)-98.6 F (37 C)] 97.9 F (36.6 C) (05/22 1100) Pulse Rate:  [93-114] 102 (05/22 1730) Cardiac Rhythm: Normal sinus rhythm (05/22 1600) Resp:  [15-30] 22 (05/22 1730) BP: (96-147)/(36-100) 127/50 (05/22 1730) SpO2:  [92 %-100 %] 97 % (05/22 1730) Arterial Line BP: (79-168)/(57-131) 109/75 (05/22 1315) Weight:  [184 lb 4.9 oz (83.6 kg)] 184 lb 4.9 oz (83.6 kg) (05/22 0500)  Hemodynamic parameters for last 24 hours:   afebrile   Intake/Output from previous day: 05/21 0701 - 05/22 0700 In: 2776.4 [I.V.:2091.4; NG/GT:60; IV Piggyback:625] Out: 2145 [Urine:1045; Emesis/NG output:1100] Intake/Output this shift: Total I/O In: 1320.1 [I.V.:1015.1; NG/GT:30; IV Piggyback:275] Out: 960 [Urine:510; Emesis/NG output:450]  Alert more comfortable   lungs clear Peripheral pulses intact Abdomen less distended Lower abdominal tenderness unchanged  Lab Results:  Recent Labs  06/22/16 0351  06/23/16 0411 06/23/16 1608  WBC 19.3*  --  12.1*  --   HGB 14.6  < > 13.4 12.2  HCT 43.2  < > 40.0 36.0  PLT 169  --  114*  --   < > = values in this interval not displayed. BMET:   Recent Labs  06/22/16 0351  06/23/16 0411 06/23/16 1608  NA 134*  < > 136 141  K 3.6  < > 4.0 3.6  CL 97*  < > 103 103  CO2 22  --  22  --   GLUCOSE 172*  < > 121* 121*  BUN 32*  < > 33* 35*  CREATININE 1.38*  < > 1.46* 1.50*  CALCIUM 8.2*  --  8.5*  --   < > = values in this interval not displayed.  PT/INR: No results for input(s): LABPROT, INR in the last 72 hours. ABG    Component Value Date/Time   PHART 7.502 (H) 06/23/2016 0338   HCO3 24.4 06/23/2016 0338   TCO2 23 06/23/2016 1608   ACIDBASEDEF 1.0 06/22/2016 1510   O2SAT 99.0 06/23/2016 0338   CBG (last 3)   Recent Labs  06/23/16 0422 06/23/16 0817 06/23/16 1130  GLUCAP 116* 134* 139*    Assessment/Plan: S/P  Overall improvement from visceral ischemia related to type B dissection   no evidence of ischemic or infarcted bowel-being followed by Gen. Surgery Patient has been without adequate nutrition for several days with low prealbumin level-we'll begin T NA and follow LFTs carefully Creatinine slowly increased now up to 1.5, avoid Lasix and continue hydration   LOS: 3 days    [name] 06/23/2016

## 2016-06-23 NOTE — Progress Notes (Signed)
Initial Nutrition Assessment  DOCUMENTATION CODES:   Obesity unspecified  INTERVENTION:    TPN per pharmacy  NUTRITION DIAGNOSIS:   Inadequate oral intake related to altered GI function as evidenced by NPO status  GOAL:   Patient will meet greater than or equal to 90% of their needs  MONITOR:   Diet advancement, PO intake, Labs, Weight trends, Skin, I & O's  REASON FOR ASSESSMENT:   Consult New TPN/TNA  ASSESSMENT:   Pt presented to [facility] ED with acute onset of chest pain radiating to the back and abdominal pain radiating to the back. Patient's had difficulty with abdominal pain on and off for several weeks. Recently had an ultrasound of her abdomen and colonoscopy without any acute findings. Symptoms became very severe 5/19 AM.  CTA demonstrated acute aortic dissection originating at the proximal descending thoracic aorta. Pt admitted for pain control, blood pressure control, bed rest and IV hydration. Plan for bowel rest with prolonged NPO status.  TPN for nutrition support.  Medications reviewed.  Labs reviewed.  Magnesium 1.4 (L). NGT to LIS.  Output high.  1.0-1.5 L/day. CBG/s [phone].  Current TPN prescription: Clinimix E 5/15 @ 30 ml/hr and 20% lipids @ 10 ml/hr.  Provides 991 kcal and 36 gm protein per day.   Meets 50% minimum estimated energy needs and 33% minimum estimated protein needs.  Diet Order:  Diet NPO time specified Except for: Sips with Meds, Ice Chips TPN (CLINIMIX-E) Adult  Skin:  Reviewed, no issues  Last BM:  5/19  Height:   Ht Readings from Last 1 Encounters:  06/20/16 5\' 3"  (1.6 m)   Weight:   Wt Readings from Last 1 Encounters:  06/23/16 184 lb 4.9 oz (83.6 kg)   Ideal Body Weight:  52.2 kg  BMI:  Body mass index is 32.65 kg/m.  Estimated Nutritional Needs:   Kcal:  2000-2200  Protein:  110-120 gm  Fluid:  per MD  EDUCATION NEEDS:   No education needs identified at this time  Arthur Holms, RD, LDN Pager #:  [phone] After-Hours Pager #: [phone]

## 2016-06-23 NOTE — Progress Notes (Signed)
Subjective: Interval History: none.. Still with abdominal pain and tenderness. Not hungry. Blood pressure is year to control  Objective: Vital signs in last 24 hours: Temp:  [97.5 F (36.4 C)-98.9 F (37.2 C)] 97.5 F (36.4 C) (05/22 0800) Pulse Rate:  [97-116] 97 (05/22 0830) Resp:  [13-30] 24 (05/22 0830) BP: (115-183)/(42-100) 116/55 (05/22 0830) SpO2:  [87 %-100 %] 97 % (05/22 0830) Arterial Line BP: (79-185)/(57-181) 122/67 (05/22 0830) Weight:  [184 lb 4.9 oz (83.6 kg)] 184 lb 4.9 oz (83.6 kg) (05/22 0500)  Intake/Output from previous day: 05/21 0701 - 05/22 0700 In: 2776.4 [I.V.:2091.4; NG/GT:60; IV Piggyback:625] Out: 2145 [Urine:1045; Emesis/NG output:1100] Intake/Output this shift: Total I/O In: 49 [I.V.:49] Out: -   Abdomen soft, diffusely tender. More so in the lower quadrants. 2+ dorsalis pedis pulses bilaterally  Lab Results:  Recent Labs  06/22/16 0351 06/22/16 1506 06/23/16 0411  WBC 19.3*  --  12.1*  HGB 14.6 10.5* 13.4  HCT 43.2 31.0* 40.0  PLT 169  --  114*   BMET  Recent Labs  06/22/16 0351 06/22/16 1506 06/23/16 0411  NA 134* 140 136  K 3.6 3.4* 4.0  CL 97* 106 103  CO2 22  --  22  GLUCOSE 172* 117* 121*  BUN 32* 39* 33*  CREATININE 1.38* 1.10* 1.46*  CALCIUM 8.2*  --  8.5*    Studies/Results: US Abdomen Complete  Result Date: 06/02/2016 CLINICAL DATA:  Nausea and abdominal distention EXAM: ABDOMEN ULTRASOUND COMPLETE COMPARISON:  None. FINDINGS: Gallbladder: Surgically removed Common bile duct: Diameter: 6 mm Liver: Increased in echogenicity consistent with fatty infiltration. No significant nodularity is noted. IVC: No abnormality visualized. Pancreas: Visualized portion unremarkable. Spleen: Size and appearance within normal limits. Right Kidney: Length: 12.5 cm. Echogenicity within normal limits. No mass or hydronephrosis visualized. Left Kidney: Length: 13.6 cm. Echogenicity within normal limits. No mass or hydronephrosis visualized.  2.2 cm cyst is noted in the midportion of the kidney anteriorly. This has increased in size from prior exam at which time it measured approximately 17 mm. Abdominal aorta: No aneurysm visualized. Other findings: None. IMPRESSION: Simple Left renal cyst increased in size by 5 mm. Status post cholecystectomy. Fatty infiltration of the liver without focal mass. Electronically Signed   By: Inez Catalina M.D.   On: 06/02/2016 08:55   Dg Chest Port 1 View  Result Date: 06/23/2016 CLINICAL DATA:  61 year old female with shortness of breath and abdominal pain. 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 Being treated conservatively at this time, including with bowel rest. EXAM: PORTABLE CHEST 1 VIEW COMPARISON:  Chest radiographs 06/22/2016 and earlier. CTA 06/20/2016 FINDINGS: Portable AP semi upright view at 0754 hours. Stable enteric tube which courses to the abdomen. Stable right PICC line. Mildly larger lung volumes and decreased streaky bibasilar opacity. Mild residual. No pneumothorax or pulmonary edema. No pleural effusion or consolidation. Stable cardiac size and mediastinal contours. IMPRESSION: 1.  Stable lines and tubes. 2. Mildly improved lung volumes and regressed bibasilar opacity favored to be atelectasis. Electronically Signed   By: Genevie Ann M.D.   On: 06/23/2016 08:19   Dg Chest Port 1 View  Result Date: 06/22/2016 CLINICAL DATA:  61 year old female with history of abdominal aortic aneurysm. Shortness of breath and vomiting. EXAM: PORTABLE CHEST 1 VIEW COMPARISON:  Chest x-ray 06/21/2016. FINDINGS: There is a right upper extremity PICC with tip terminating in the superior cavoatrial  junction. A nasogastric tube is seen extending into the stomach, however, the tip of the nasogastric tube extends below the lower margin of the image. Lung volumes are low. Linear bibasilar opacities favored to  reflect areas of subsegmental atelectasis, however, underlying airspace consolidation from infection or aspiration is not excluded. No pleural effusions. No evidence of pulmonary edema. Heart size is normal. Upper mediastinal contours are within normal limits. IMPRESSION: 1. Support apparatus, as above. 2. Low lung volumes with bibasilar areas of atelectasis and/or airspace consolidation. Electronically Signed   By: Vinnie Langton M.D.   On: 06/22/2016 07:30   Dg Chest Port 1 View  Result Date: 06/21/2016 CLINICAL DATA:  Aortic dissection EXAM: PORTABLE CHEST 1 VIEW COMPARISON:  06/20/2016 FINDINGS: Bibasilar atelectasis. Heart is normal size. No effusions or acute bony abnormality. IMPRESSION: Bibasilar atelectasis. Electronically Signed   By: Rolm Baptise M.D.   On: 06/21/2016 07:18   Dg Chest Port 1 View  Result Date: 06/20/2016 CLINICAL DATA:  Hypoxia, unresponsive EXAM: PORTABLE CHEST 1 VIEW COMPARISON:  CTA chest dated 06/20/2016 FINDINGS: Lungs are essentially clear. No focal consolidation. No pleural effusion or pneumothorax. The heart is top-normal in size. IMPRESSION: No evidence of acute cardiopulmonary disease. Electronically Signed   By: Julian Hy M.D.   On: 06/20/2016 17:01   Dg Chest Port 1 View  Result Date: 06/20/2016 CLINICAL DATA:  Sudden onset sharp substernal chest pain that radiates to the back. Nausea, dizziness and shortness of breath. EXAM: PORTABLE CHEST 1 VIEW COMPARISON:  None. FINDINGS: Trachea is midline. Heart size is accentuated by AP supine technique. Probable mild scarring in the right middle lobe and lingula. Lungs are otherwise clear. No pleural fluid. IMPRESSION: No acute findings. Electronically Signed   By: Lorin Picket M.D.   On: 06/20/2016 09:56   Dg Abd Portable 1v  Result Date: 06/23/2016 CLINICAL DATA:  61 year old female with shortness of breath and abdominal pain. Stanford type B aortic dissection extending from the proximal descending  thoracic aorta to the aortoiliac bifurcation. True lumen occlusion below the SMA. Being treated conservatively at this time, including with bowel rest. EXAM: PORTABLE ABDOMEN - 1 VIEW COMPARISON:  Abdominal radiographs 06/22/2016. CTA 06/20/2016, and earlier. FINDINGS: Portable AP supine view at 0759 hours. Stable NG tube, side hole to level of the gastric body and tip at the level of the gastric antrum. Stable cholecystectomy clips. Stable bowel gas pattern with several gas containing nondilated small and large bowel loops. No definite pneumoperitoneum on this supine view. Abdominal and pelvic visceral contours appear stable. No acute osseous abnormality identified. IMPRESSION: 1. Stable NG tube position. 2. Stable, nonobstructed bowel-gas pattern. Electronically Signed   By: Genevie Ann M.D.   On: 06/23/2016 08:18   Dg Abd Portable 1v  Result Date: 06/22/2016 CLINICAL DATA:  Abdominal pain for few days with nausea and vomiting. EXAM: PORTABLE ABDOMEN - 1 VIEW COMPARISON:  06/22/2016 abdominal radiograph. FINDINGS: Enteric tube loops in the gastric fundus and terminates in the body of the stomach, without appreciable kink. Top-normal caliber small bowel loops throughout the abdomen. Minimal colonic stool. No evidence of pneumatosis or pneumoperitoneum. No radiopaque urolithiasis. Cholecystectomy clips are seen in the right upper quadrant of the abdomen. Patchy opacities at the lung bases. IMPRESSION: 1. Enteric tube loops in the gastric fundus and terminates in the body of the stomach without appreciable kink. 2. Top-normal caliber small bowel loops, unchanged. 3. Patchy bibasilar lung opacities, correlate with chest radiograph. Electronically Signed   By: Corene Cornea  A Poff M.D.   On: 06/22/2016 16:58   Dg Abd Portable 1v  Result Date: 06/22/2016 CLINICAL DATA:  Vomiting.  Shortness of breath.  AAA . EXAM: PORTABLE ABDOMEN - 1 VIEW COMPARISON:  06/21/2016. FINDINGS: Surgical clips right upper quadrant. NG tube noted  with tip over the distal stomach. No bowel distention. No acute bony abnormality identified. Mild basilar atelectasis. IMPRESSION: NG tube noted with its tip over the distal stomach. No bowel distention or acute intra-abdominal abnormality identified. Electronically Signed   By: Marcello Moores  Register   On: 06/22/2016 07:30   Dg Abd Portable 1v  Result Date: 06/21/2016 CLINICAL DATA:  Feeding tube placement EXAM: PORTABLE ABDOMEN - 1 VIEW COMPARISON:  06/21/2016 FINDINGS: NG tube coils in the fundus of the stomach with the tip in the distal stomach. Decompression of the stomach. IMPRESSION: NG tube tip in the distal stomach. Electronically Signed   By: Rolm Baptise M.D.   On: 06/21/2016 15:46   Dg Abd Portable 1v  Result Date: 06/21/2016 CLINICAL DATA:  Ileus  Vomiting that started this AM per patient EXAM: PORTABLE ABDOMEN - 1 VIEW COMPARISON:  06/20/2016 FINDINGS: Gaseous distention of the stomach. Paucity of small bowel and colonic gas. Cholecystectomy clips. Linear scarring/ atelectasis in the lung bases. IMPRESSION: 1. Gaseous distention of the stomach. Electronically Signed   By: Lucrezia Europe M.D.   On: 06/21/2016 11:54   Dg Abd Portable 1v  Result Date: 06/20/2016 CLINICAL DATA:  Aortic dissection EXAM: PORTABLE ABDOMEN - 1 VIEW COMPARISON:  CT abdomen/ pelvis dated 06/20/2016 FINDINGS: Nonobstructive bowel gas pattern. Visualized osseous structures are within normal limits. IMPRESSION: Unremarkable abdominal radiograph. Electronically Signed   By: Julian Hy M.D.   On: 06/20/2016 17:02   Ct Angio Chest/abd/pel For Dissection W And/or W/wo  Result Date: 06/20/2016 CLINICAL DATA:  Substernal chest pain radiating to back EXAM: CT ANGIOGRAPHY CHEST, ABDOMEN AND PELVIS TECHNIQUE: Multidetector CT imaging through the chest, abdomen and pelvis was performed using the standard protocol during bolus administration of intravenous contrast. Multiplanar reconstructed images and MIPs were obtained and  reviewed to evaluate the vascular anatomy. CONTRAST:  100 cc Isovue 370 IV COMPARISON:  CT abdomen and pelvis 10/27/2011 FINDINGS: CTA CHEST FINDINGS Cardiovascular: There is the type B aortic dissection beginning in the distal aortic arch just beyond the origin of the great vessels. The true lumen is compressed by the false lumen. No aneurysm. No pulmonary embolus. Heart is normal size. Mediastinum/Nodes: No mediastinal, hilar, or axillary adenopathy. Lungs/Pleura: Atelectasis or scarring in the lingula. Lungs otherwise clear. No effusions. Musculoskeletal: No acute bony abnormality. Review of the MIP images confirms the above findings. CTA ABDOMEN AND PELVIS FINDINGS VASCULAR Aorta: Dissection continues throughout the abdominal aorta with the celiac artery and superior mesenteric artery arising from the true lumen anteriorly. The dissection may extend into the left renal artery where there is only a small amount of blood flow noted in the proximal and mid left renal artery. Right renal artery appears to arise from the false lumen and is patent. The the true lumen appears thrombosed below the origin of the superior mesenteric artery. Inferior mesenteric artery arises from the thrombosed true lumen and appears occluded proximally, reconstitutes several cm from the origin. Celiac: Patent. SMA: Patent. Renals: As above. IMA: As above. Inflow: To the dissection continues into both common iliac arteries with thrombosed true lumens. The dissection appears to terminate at approximately the level of the common iliac bifurcation bilaterally. Veins: Grossly patent and unremarkable.  Review of the MIP images confirms the above findings. NON-VASCULAR Hepatobiliary: Mild diffuse fatty infiltration. Prior cholecystectomy. Pancreas: No focal abnormality or ductal dilatation. Spleen: No focal abnormality.  Normal size. Adrenals/Urinary Tract: Nodules in the left adrenal gland are low-density on the precontrast imaging compatible  with small adenomas. There are areas of non perfusion noted in the mid and lower poles of the left kidney compatible with infarction, likely related to the involvement of the left renal artery by dissection. Urinary bladder unremarkable. Stomach/Bowel: Stomach, large and small bowel grossly unremarkable. Lymphatic: No adenopathy. Reproductive: Prior hysterectomy.  No adnexal masses. Other: No free fluid or free air. Musculoskeletal: No acute bony abnormality. Review of the MIP images confirms the above findings. 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 This involves the descending thoracic aorta, abdominal aorta and iliac vessels. The true lumen is compressed by the larger false lumen, and is thrombosed below the SMA/renal artery origins. The dissection appears to extend into the left renal artery with partial occlusion and areas of infarct in the mid and lower pole of the left kidney. Fatty infiltration of the liver. Critical Value/emergent results were called by telephone at the time of interpretation on 06/20/2016 at 10:13 am to Dr. Fredia Sorrow , who verbally acknowledged these results. Electronically Signed   By: Rolm Baptise M.D.   On: 06/20/2016 10:15   Anti-infectives: Anti-infectives    Start     Dose/Rate Route Frequency Ordered Stop   06/22/16 1800  clindamycin (CLEOCIN) IVPB 600 mg     600 mg 100 mL/hr over 30 Minutes Intravenous Every 8 hours 06/22/16 1723     06/22/16 1000  azithromycin (ZITHROMAX) 250 mg in dextrose 5 % 125 mL IVPB     250 mg 125 mL/hr over 60 Minutes Intravenous Every 24 hours 06/22/16 0826     06/22/16 0900  levofloxacin (LEVAQUIN) IVPB 500 mg  Status:  Discontinued     500 mg 100 mL/hr over 60 Minutes Intravenous Every 24 hours 06/22/16 0759 06/22/16 0824      Assessment/Plan: s/p * No surgery found * Stable overall. White count and lactic acid down. Continued abdominal discomfort. Continue  bowel rest.   LOS: 3 days   Curt Jews 06/23/2016, 8:43 AM

## 2016-06-23 NOTE — Progress Notes (Signed)
Subjective/Chief Complaint: Pt with con't abdominal pain States its somewhat better than yesterday Some flatus No BM   Objective: Vital signs in last 24 hours: Temp:  [97.7 F (36.5 C)-98.9 F (37.2 C)] 97.7 F (36.5 C) (05/22 0400) Pulse Rate:  [99-116] 102 (05/22 0600) Resp:  [13-30] 21 (05/22 0600) BP: (115-183)/(42-94) 128/61 (05/22 0600) SpO2:  [87 %-100 %] 97 % (05/22 0600) Arterial Line BP: (79-185)/(57-181) 90/86 (05/22 0600) Weight:  [83.6 kg (184 lb 4.9 oz)] 83.6 kg (184 lb 4.9 oz) (05/22 0500) Last BM Date: 06/20/16  Intake/Output from previous day: 05/21 0701 - 05/22 0700 In: 2776.4 [I.V.:2091.4; NG/GT:60; IV Piggyback:625] Out: 2145 [Urine:1045; Emesis/NG output:1100] Intake/Output this shift: No intake/output data recorded.  General appearance: alert and cooperative Cardio: regular rate and rhythm, S1, S2 normal, no murmur, click, rub or gallop GI: abnormal findings:  soft, ttp BLQ >BUQ, no rebound/guarding  Lab Results:   Recent Labs  06/22/16 0351 06/22/16 1506 06/23/16 0411  WBC 19.3*  --  12.1*  HGB 14.6 10.5* 13.4  HCT 43.2 31.0* 40.0  PLT 169  --  114*   BMET  Recent Labs  06/22/16 0351 06/22/16 1506 06/23/16 0411  NA 134* 140 136  K 3.6 3.4* 4.0  CL 97* 106 103  CO2 22  --  22  GLUCOSE 172* 117* 121*  BUN 32* 39* 33*  CREATININE 1.38* 1.10* 1.46*  CALCIUM 8.2*  --  8.5*   PT/INR No results for input(s): LABPROT, INR in the last 72 hours. ABG  Recent Labs  06/22/16 1510 06/23/16 0338  PHART 7.470* 7.502*  HCO3 22.2 24.4    Studies/Results: Dg Chest Port 1 View  Result Date: 06/22/2016 CLINICAL DATA:  61 year old female with history of abdominal aortic aneurysm. Shortness of breath and vomiting. EXAM: PORTABLE CHEST 1 VIEW COMPARISON:  Chest x-ray 06/21/2016. FINDINGS: There is a right upper extremity PICC with tip terminating in the superior cavoatrial junction. A nasogastric tube is seen extending into the stomach,  however, the tip of the nasogastric tube extends below the lower margin of the image. Lung volumes are low. Linear bibasilar opacities favored to reflect areas of subsegmental atelectasis, however, underlying airspace consolidation from infection or aspiration is not excluded. No pleural effusions. No evidence of pulmonary edema. Heart size is normal. Upper mediastinal contours are within normal limits. IMPRESSION: 1. Support apparatus, as above. 2. Low lung volumes with bibasilar areas of atelectasis and/or airspace consolidation. Electronically Signed   By: Vinnie Langton M.D.   On: 06/22/2016 07:30   Dg Abd Portable 1v  Result Date: 06/22/2016 CLINICAL DATA:  Abdominal pain for few days with nausea and vomiting. EXAM: PORTABLE ABDOMEN - 1 VIEW COMPARISON:  06/22/2016 abdominal radiograph. FINDINGS: Enteric tube loops in the gastric fundus and terminates in the body of the stomach, without appreciable kink. Top-normal caliber small bowel loops throughout the abdomen. Minimal colonic stool. No evidence of pneumatosis or pneumoperitoneum. No radiopaque urolithiasis. Cholecystectomy clips are seen in the right upper quadrant of the abdomen. Patchy opacities at the lung bases. IMPRESSION: 1. Enteric tube loops in the gastric fundus and terminates in the body of the stomach without appreciable kink. 2. Top-normal caliber small bowel loops, unchanged. 3. Patchy bibasilar lung opacities, correlate with chest radiograph. Electronically Signed   By: Ilona Sorrel M.D.   On: 06/22/2016 16:58   Dg Abd Portable 1v  Result Date: 06/22/2016 CLINICAL DATA:  Vomiting.  Shortness of breath.  AAA . EXAM: PORTABLE ABDOMEN -  1 VIEW COMPARISON:  06/21/2016. FINDINGS: Surgical clips right upper quadrant. NG tube noted with tip over the distal stomach. No bowel distention. No acute bony abnormality identified. Mild basilar atelectasis. IMPRESSION: NG tube noted with its tip over the distal stomach. No bowel distention or acute  intra-abdominal abnormality identified. Electronically Signed   By: Marcello Moores  Register   On: 06/22/2016 07:30   Dg Abd Portable 1v  Result Date: 06/21/2016 CLINICAL DATA:  Feeding tube placement EXAM: PORTABLE ABDOMEN - 1 VIEW COMPARISON:  06/21/2016 FINDINGS: NG tube coils in the fundus of the stomach with the tip in the distal stomach. Decompression of the stomach. IMPRESSION: NG tube tip in the distal stomach. Electronically Signed   By: Rolm Baptise M.D.   On: 06/21/2016 15:46   Dg Abd Portable 1v  Result Date: 06/21/2016 CLINICAL DATA:  Ileus  Vomiting that started this AM per patient EXAM: PORTABLE ABDOMEN - 1 VIEW COMPARISON:  06/20/2016 FINDINGS: Gaseous distention of the stomach. Paucity of small bowel and colonic gas. Cholecystectomy clips. Linear scarring/ atelectasis in the lung bases. IMPRESSION: 1. Gaseous distention of the stomach. Electronically Signed   By: Lucrezia Europe M.D.   On: 06/21/2016 11:54    Anti-infectives: Anti-infectives    Start     Dose/Rate Route Frequency Ordered Stop   06/22/16 1800  clindamycin (CLEOCIN) IVPB 600 mg     600 mg 100 mL/hr over 30 Minutes Intravenous Every 8 hours 06/22/16 1723     06/22/16 1000  azithromycin (ZITHROMAX) 250 mg in dextrose 5 % 125 mL IVPB     250 mg 125 mL/hr over 60 Minutes Intravenous Every 24 hours 06/22/16 0826     06/22/16 0900  levofloxacin (LEVAQUIN) IVPB 500 mg  Status:  Discontinued     500 mg 100 mL/hr over 60 Minutes Intravenous Every 24 hours 06/22/16 0759 06/22/16 0824      Assessment/Plan: 61 y/o F AAA Active Problems:   Aortic dissection distal to left subclavian (HCC)  Labs appearing to be improving slowly.  Abd pain is stable 1.  Con't NGT, NPO 2. Will con't to follow.  No immediate surgical plans. 3.  OOBTC     LOS: 3 days    Rosario Jacks., Anne Hahn 06/23/2016

## 2016-06-24 ENCOUNTER — Inpatient Hospital Stay (HOSPITAL_COMMUNITY): Payer: BLUE CROSS/BLUE SHIELD

## 2016-06-24 LAB — COMPREHENSIVE METABOLIC PANEL
ALT: 353 U/L — ABNORMAL HIGH (ref 14–54)
AST: 192 U/L — ABNORMAL HIGH (ref 15–41)
Albumin: 2.5 g/dL — ABNORMAL LOW (ref 3.5–5.0)
Alkaline Phosphatase: 112 U/L (ref 38–126)
Anion gap: 8 (ref 5–15)
BUN: 38 mg/dL — ABNORMAL HIGH (ref 6–20)
CO2: 23 mmol/L (ref 22–32)
Calcium: 8.4 mg/dL — ABNORMAL LOW (ref 8.9–10.3)
Chloride: 105 mmol/L (ref 101–111)
Creatinine, Ser: 1.38 mg/dL — ABNORMAL HIGH (ref 0.44–1.00)
GFR calc Af Amer: 47 mL/min — ABNORMAL LOW (ref >60)
GFR calc non Af Amer: 41 mL/min — ABNORMAL LOW (ref >60)
Glucose, Bld: 163 mg/dL — ABNORMAL HIGH (ref 65–99)
Potassium: 3.6 mmol/L (ref 3.5–5.1)
Sodium: 136 mmol/L (ref 135–145)
Total Bilirubin: 1.1 mg/dL (ref 0.3–1.2)
Total Protein: 5.6 g/dL — ABNORMAL LOW (ref 6.5–8.1)

## 2016-06-24 LAB — POCT I-STAT, CHEM 8
BUN: 38 mg/dL — ABNORMAL HIGH (ref 6–20)
BUN: 42 mg/dL — ABNORMAL HIGH (ref 6–20)
Calcium, Ion: 1.19 mmol/L (ref 1.15–1.40)
Calcium, Ion: 1.21 mmol/L (ref 1.15–1.40)
Chloride: 102 mmol/L (ref 101–111)
Chloride: 105 mmol/L (ref 101–111)
Creatinine, Ser: 1.4 mg/dL — ABNORMAL HIGH (ref 0.44–1.00)
Creatinine, Ser: 1.4 mg/dL — ABNORMAL HIGH (ref 0.44–1.00)
Glucose, Bld: 179 mg/dL — ABNORMAL HIGH (ref 65–99)
Glucose, Bld: 364 mg/dL — ABNORMAL HIGH (ref 65–99)
HCT: 35 % — ABNORMAL LOW (ref 36.0–46.0)
HCT: 37 % (ref 36.0–46.0)
Hemoglobin: 11.9 g/dL — ABNORMAL LOW (ref 12.0–15.0)
Hemoglobin: 12.6 g/dL (ref 12.0–15.0)
Potassium: 3.6 mmol/L (ref 3.5–5.1)
Potassium: 4.2 mmol/L (ref 3.5–5.1)
Sodium: 135 mmol/L (ref 135–145)
Sodium: 139 mmol/L (ref 135–145)
TCO2: 22 mmol/L (ref 0–100)
TCO2: 23 mmol/L (ref 0–100)

## 2016-06-24 LAB — GLUCOSE, CAPILLARY
Glucose-Capillary: 152 mg/dL — ABNORMAL HIGH (ref 65–99)
Glucose-Capillary: 166 mg/dL — ABNORMAL HIGH (ref 65–99)
Glucose-Capillary: 167 mg/dL — ABNORMAL HIGH (ref 65–99)
Glucose-Capillary: 174 mg/dL — ABNORMAL HIGH (ref 65–99)
Glucose-Capillary: 183 mg/dL — ABNORMAL HIGH (ref 65–99)
Glucose-Capillary: 183 mg/dL — ABNORMAL HIGH (ref 65–99)

## 2016-06-24 LAB — CBC
HCT: 38.1 % (ref 36.0–46.0)
Hemoglobin: 12.8 g/dL (ref 12.0–15.0)
MCH: 33.4 pg (ref 26.0–34.0)
MCHC: 33.6 g/dL (ref 30.0–36.0)
MCV: 99.5 fL (ref 78.0–100.0)
Platelets: 95 10*3/uL — ABNORMAL LOW (ref 150–400)
RBC: 3.83 MIL/uL — ABNORMAL LOW (ref 3.87–5.11)
RDW: 13.2 % (ref 11.5–15.5)
WBC: 12.7 10*3/uL — ABNORMAL HIGH (ref 4.0–10.5)

## 2016-06-24 LAB — BLOOD GAS, ARTERIAL
Acid-base deficit: 2 mmol/L (ref 0.0–2.0)
Bicarbonate: 21.6 mmol/L (ref 20.0–28.0)
Drawn by: 419771
O2 Content: 4 L/min
O2 Saturation: 93.5 %
Patient temperature: 98.6
pCO2 arterial: 33.1 mmHg (ref 32.0–48.0)
pH, Arterial: 7.43 (ref 7.350–7.450)
pO2, Arterial: 68.9 mmHg — ABNORMAL LOW (ref 83.0–108.0)

## 2016-06-24 LAB — MAGNESIUM: Magnesium: 2.3 mg/dL (ref 1.7–2.4)

## 2016-06-24 LAB — DIFFERENTIAL
Basophils Absolute: 0.1 10*3/uL (ref 0.0–0.1)
Basophils Relative: 1 %
Eosinophils Absolute: 0 10*3/uL (ref 0.0–0.7)
Eosinophils Relative: 0 %
Lymphocytes Relative: 8 %
Lymphs Abs: 1 10*3/uL (ref 0.7–4.0)
Monocytes Absolute: 2.2 10*3/uL — ABNORMAL HIGH (ref 0.1–1.0)
Monocytes Relative: 17 %
Neutro Abs: 9.4 10*3/uL — ABNORMAL HIGH (ref 1.7–7.7)
Neutrophils Relative %: 74 %

## 2016-06-24 LAB — PHOSPHORUS: Phosphorus: 2.6 mg/dL (ref 2.5–4.6)

## 2016-06-24 LAB — THIOCYANATE, BLOOD: Thiocyanate: 1.1 mg/dl

## 2016-06-24 LAB — COOXEMETRY PANEL
Carboxyhemoglobin: 0.7 % (ref 0.5–1.5)
Methemoglobin: 1 % (ref 0.0–1.5)
O2 Saturation: 74.9 %
Total hemoglobin: 13.3 g/dL (ref 12.0–16.0)

## 2016-06-24 LAB — LACTIC ACID, PLASMA: Lactic Acid, Venous: 1.7 mmol/L (ref 0.5–1.9)

## 2016-06-24 LAB — AMYLASE: Amylase: 98 U/L (ref 28–100)

## 2016-06-24 LAB — CALCIUM, IONIZED: Calcium, Ionized, Serum: 4.8 mg/dL (ref 4.5–5.6)

## 2016-06-24 LAB — PATHOLOGIST SMEAR REVIEW

## 2016-06-24 LAB — TRIGLYCERIDES: Triglycerides: 506 mg/dL — ABNORMAL HIGH (ref <150)

## 2016-06-24 MED ORDER — M.V.I. ADULT IV INJ
INTRAVENOUS | Status: AC
  Administered 2016-06-24: 17:00:00 via INTRAVENOUS
  Filled 2016-06-24: qty 1440

## 2016-06-24 MED ORDER — ALBUMIN HUMAN 25 % IV SOLN
12.5000 g | Freq: Four times a day (QID) | INTRAVENOUS | Status: AC
  Administered 2016-06-24 (×3): 12.5 g via INTRAVENOUS
  Filled 2016-06-24 (×3): qty 50

## 2016-06-24 MED ORDER — NITROGLYCERIN 0.4 MG/HR TD PT24
0.4000 mg | MEDICATED_PATCH | Freq: Every day | TRANSDERMAL | Status: DC
  Administered 2016-06-24 – 2016-07-06 (×13): 0.4 mg via TRANSDERMAL
  Filled 2016-06-24 (×14): qty 1

## 2016-06-24 MED ORDER — POTASSIUM CHLORIDE 10 MEQ/50ML IV SOLN
10.0000 meq | INTRAVENOUS | Status: AC
  Administered 2016-06-24 (×3): 10 meq via INTRAVENOUS
  Filled 2016-06-24: qty 50

## 2016-06-24 MED ORDER — BISACODYL 10 MG RE SUPP
10.0000 mg | Freq: Once | RECTAL | Status: AC
  Administered 2016-06-24: 10 mg via RECTAL
  Filled 2016-06-24: qty 1

## 2016-06-24 NOTE — Progress Notes (Signed)
CC:  Abdominal pain  Subjective: Up in chair still having some pain but is taking less pain medicine. No BM, ? flatus  Objective: Vital signs in last 24 hours: Temp:  [97.5 F (36.4 C)-99.2 F (37.3 C)] 97.7 F (36.5 C) (05/23 0400) Pulse Rate:  [88-109] 106 (05/23 0700) Resp:  [15-25] 16 (05/23 0700) BP: (96-140)/(36-100) 121/54 (05/23 0700) SpO2:  [92 %-100 %] 97 % (05/23 0700) Arterial Line BP: (104-168)/(62-131) 109/75 (05/22 1315) Weight:  [84.8 kg (186 lb 15.2 oz)] 84.8 kg (186 lb 15.2 oz) (05/23 0600) Last BM Date: 06/19/16 (Per husband)  Intake/Output from previous day: 05/22 0701 - 05/23 0700 In: 2578.1 [I.V.:2073.1; NG/GT:30; IV Piggyback:475] Out: 2065 [Urine:1065; Emesis/NG output:1000] Intake/Output this shift: No intake/output data recorded.  General appearance: alert, cooperative and no distress Resp: clear to auscultation bilaterally GI: still tender, no BS, No BM, ? flatus  Lab Results:   Recent Labs  06/23/16 0411 06/23/16 1608 06/24/16 0313  WBC 12.1*  --  12.7*  HGB 13.4 12.2 12.8  HCT 40.0 36.0 38.1  PLT 114*  --  95*    BMET  Recent Labs  06/23/16 0411 06/23/16 1608 06/24/16 0313  NA 136 141 136  K 4.0 3.6 3.6  CL 103 103 105  CO2 22  --  23  GLUCOSE 121* 121* 163*  BUN 33* 35* 38*  CREATININE 1.46* 1.50* 1.38*  CALCIUM 8.5*  --  8.4*   PT/INR No results for input(s): LABPROT, INR in the last 72 hours.   Recent Labs Lab 06/20/16 0815 06/21/16 0328 06/22/16 0351 06/23/16 0411 06/24/16 0313  AST 22 147* 486* 318* 192*  ALT 27 116* 535* 526* 353*  ALKPHOS 103 91 93 137* 112  BILITOT 0.7 0.6 0.9 1.6* 1.1  PROT 7.4 6.5 5.6* 5.4* 5.6*  ALBUMIN 3.8 3.2* 3.0* 2.8* 2.5*     Lipase     Component Value Date/Time   LIPASE 38 06/20/2016 0815     Medications: . albuterol  2.5 mg Inhalation Q6H  . chlorhexidine  15 mL Mouth Rinse BID  . Chlorhexidine Gluconate Cloth  6 each Topical Daily  . citalopram  20 mg Oral  Daily  . cloNIDine  0.2 mg Transdermal Weekly  . fentaNYL  50 mcg Transdermal Q72H  . insulin aspart  0-24 Units Subcutaneous Q4H  . insulin glargine  24 Units Subcutaneous BID  . mouth rinse  15 mL Mouth Rinse q12n4p  . metoprolol tartrate  5 mg Intravenous Q4H  . mometasone-formoterol  2 puff Inhalation BID  . nicotine  14 mg Transdermal Daily  . pantoprazole (PROTONIX) IV  40 mg Intravenous Q24H  . sodium chloride flush  10-40 mL Intracatheter Q12H   . sodium chloride    . sodium chloride 35 mL/hr at 06/24/16 0700  . azithromycin Stopped (06/23/16 1031)  . clindamycin (CLEOCIN) IV 600 mg (06/24/16 5397)  . esmolol Stopped (06/23/16 2039)  . nitroPRUSSide Stopped (06/22/16 1651)  . Marland KitchenTPN (CLINIMIX-E) Adult 30 mL/hr at 06/24/16 0700   Antibiotics Given (last 72 hours)    Date/Time Action Medication Dose Rate   06/22/16 0949 New Bag/Given   azithromycin (ZITHROMAX) 250 mg in dextrose 5 % 125 mL IVPB 250 mg 125 mL/hr   06/22/16 2000 New Bag/Given   clindamycin (CLEOCIN) IVPB 600 mg 600 mg 100 mL/hr   06/23/16 0554 New Bag/Given   clindamycin (CLEOCIN) IVPB 600 mg 600 mg 100 mL/hr   06/23/16 6734 New Bag/Given  azithromycin (ZITHROMAX) 250 mg in dextrose 5 % 125 mL IVPB 250 mg 125 mL/hr   06/23/16 1321 New Bag/Given   clindamycin (CLEOCIN) IVPB 600 mg 600 mg 100 mL/hr   06/23/16 2134 New Bag/Given   clindamycin (CLEOCIN) IVPB 600 mg 600 mg 100 mL/hr   06/24/16 9166 New Bag/Given   clindamycin (CLEOCIN) IVPB 600 mg 600 mg 100 mL/hr      Assessment/Plan Type B thoracic aortic dissection  - superior mesenteri/celiac open thru true lumen, decreased left kidney flow, SMA occlusion IMA occluded but reconstitutes via collaterals Abdominal pain - ? ischemia  Tobacco use Gastroparesis/GERD IBS Hypertension Type II diabetes FEN:  IV fluids/NPO/NG - clamping trial ID:  Azithromycin/Clindamycin 5/21 =>> day 3 DVT:  SCD's  Plan:  Discussed with pt and we were going to try Reglan,  but Dr. Prescott Gum does not want to do that at this point.  Will try some clamping trials.  She is up in the chair so she is getting some mobilization.        LOS: 4 days    Issam Carlyon 06/24/2016 [phone]

## 2016-06-24 NOTE — Progress Notes (Signed)
  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  Abdominal pain improved Blood pressure much improved now off IV nitroprusside and IV esmolol NG tube output 1 L over 24 hours KUB shows no significant distended bowel loops LFTs and amylase are improving Acid-base and leukocytosis are improving Patient started on TPN for prolonged ileus, pancreatitis, and possible bowel ischemia for bowel rest We'll leave NG tube in place until she starts having bowel movements Objective: Vital signs in last 24 hours: Temp:  [97.6 F (36.4 C)-99.2 F (37.3 C)] 98 F (36.7 C) (05/23 0752) Pulse Rate:  [88-109] 106 (05/23 0700) Cardiac Rhythm: Sinus tachycardia (05/23 0730) Resp:  [16-25] 16 (05/23 0700) BP: (96-140)/(36-63) 121/54 (05/23 0700) SpO2:  [92 %-100 %] 100 % (05/23 0909) Arterial Line BP: (104-168)/(66-131) 109/75 (05/22 1315) Weight:  [186 lb 15.2 oz (84.8 kg)] 186 lb 15.2 oz (84.8 kg) (05/23 0600)  Hemodynamic parameters for last 24 hours:    Intake/Output from previous day: 05/22 0701 - 05/23 0700 In: 2578.1 [I.V.:2073.1; NG/GT:30; IV Piggyback:475] Out: 2065 [Urine:1065; Emesis/NG output:1000] Intake/Output this shift: Total I/O In: -  Out: 35 [Urine:35]      Physical Exam  General: Awake responsive anxious HEENT: Normocephalic pupils equal , dentition adequate Neck: Supple without JVD, adenopathy, or bruit Chest: Clear to auscultation, symmetrical breath sounds, no rhonchi, no tenderness             or deformity Cardiovascular: Regular rate and rhythm, no murmur, no gallop, peripheral pulses             palpable in all extremities Abdomen: NG tube in place, abdomen mildly distended, persistent tenderness and lower quadrants without guarding r no palpable mass or organomegaly Extremities: Pulse present, adequately-perfused, no clubbing cyanosis edema or tenderness,              no venous stasis changes of the  legs Rectal/GU: Deferred Neuro: Grossly non--focal and symmetrical throughout Skin: Clean and dry without rash or ulceration   Lab Results:  Recent Labs  06/23/16 0411 06/23/16 1608 06/24/16 0313  WBC 12.1*  --  12.7*  HGB 13.4 12.2 12.8  HCT 40.0 36.0 38.1  PLT 114*  --  95*   BMET:  Recent Labs  06/23/16 0411 06/23/16 1608 06/24/16 0313  NA 136 141 136  K 4.0 3.6 3.6  CL 103 103 105  CO2 22  --  23  GLUCOSE 121* 121* 163*  BUN 33* 35* 38*  CREATININE 1.46* 1.50* 1.38*  CALCIUM 8.5*  --  8.4*    PT/INR: No results for input(s): LABPROT, INR in the last 72 hours. ABG    Component Value Date/Time   PHART 7.430 06/24/2016 0332   HCO3 21.6 06/24/2016 0332   TCO2 23 06/23/2016 1608   ACIDBASEDEF 2.0 06/24/2016 0332   O2SAT 93.5 06/24/2016 0332   CBG (last 3)   Recent Labs  06/24/16 0016 06/24/16 0330 06/24/16 0749  GLUCAP 152* 166* 183*    Assessment/Plan: S/P  Continue current care Continue TPN Leave NG tube in place until she starts having bowel movements Patient states she has significant untoward response to Reglan with tachycardia and flushing and it has not helped her gastroparesis in the past   LOS: 4 days    [name] 06/24/2016

## 2016-06-24 NOTE — Progress Notes (Addendum)
    Subjective  -   Called to see patient for discoloration in feet with pain   Physical Exam:  Palpable DP pulses bilaterally Toes are cold and blue but have intact motor and sensory  function       Assessment/Plan:   I discussed with the patient and family that I do not recommend any intervention currently.  She has palpable pedal pulses. I would recommend keeping her feet warm.   Would consider repeat imaging if creatinine remains stable.  Annamarie Major 06/24/2016 3:20 PM --  Vitals:   06/24/16 1030 06/24/16 1045  BP: (!) 121/56 131/60  Pulse:    Resp: 19 20  Temp:      Intake/Output Summary (Last 24 hours) at 06/24/16 1520 Last data filed at 06/24/16 1243  Gross per 24 hour  Intake          1783.03 ml  Output             1295 ml  Net           488.03 ml     Laboratory CBC    Component Value Date/Time   WBC 12.7 (H) 06/24/2016 0313   HGB 12.8 06/24/2016 0313   HCT 38.1 06/24/2016 0313   PLT 95 (L) 06/24/2016 0313    BMET    Component Value Date/Time   NA 136 06/24/2016 0313   K 3.6 06/24/2016 0313   CL 105 06/24/2016 0313   CO2 23 06/24/2016 0313   GLUCOSE 163 (H) 06/24/2016 0313   BUN 38 (H) 06/24/2016 0313   CREATININE 1.38 (H) 06/24/2016 0313   CALCIUM 8.4 (L) 06/24/2016 0313   GFRNONAA 41 (L) 06/24/2016 0313   GFRAA 47 (L) 06/24/2016 0313    COAG No results found for: INR, PROTIME No results found for: PTT  Antibiotics Anti-infectives    Start     Dose/Rate Route Frequency Ordered Stop   06/22/16 1800  clindamycin (CLEOCIN) IVPB 600 mg     600 mg 100 mL/hr over 30 Minutes Intravenous Every 8 hours 06/22/16 1723     06/22/16 1000  azithromycin (ZITHROMAX) 250 mg in dextrose 5 % 125 mL IVPB     250 mg 125 mL/hr over 60 Minutes Intravenous Every 24 hours 06/22/16 0826     06/22/16 0900  levofloxacin (LEVAQUIN) IVPB 500 mg  Status:  Discontinued     500 mg 100 mL/hr over 60 Minutes Intravenous Every 24 hours 06/22/16 0759 06/22/16  0824       V. Leia Alf, M.D. [facility] Office: [phone] Pager:  [phone]

## 2016-06-24 NOTE — Progress Notes (Signed)
CT surgery p.m. Rounds  Patient's blood pressure controlled now off drips Up in chair several hours today Abdominal tenderness has improved No bowel movement or flatus after Dulcolax suppository NG tube output appears to be diminishing Left greater than right foot mottling with strong pedal pulse Warming pack in place, nitroglycerin patch ordered Continue current care

## 2016-06-24 NOTE — Progress Notes (Signed)
PHARMACY - ADULT TOTAL PARENTERAL NUTRITION CONSULT NOTE   Pharmacy Consult for TPN Indication: prolonged NPO status/high NG output   Patient Measurements: Height: _0  (160 cm) Weight: 186 lb 15.2 oz (84.8 kg) IBW/kg (Calculated) : 52.4 TPN AdjBW (KG): 60.3 Body mass index is 33.12 kg/m. 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 Patient had a significant hx of N/V for 2-4 weeks prior to admission. Weight appears to have been maintained. Patient had a NG tube placed 5/20 which has has significant output with 1-1.5 L a day.   GI: NG tube in place for drainage (1 L out 5/22). To try a clamping trial. Continued abdominal pain. No bowel function.  Prealbumin-14.8 and albumin 2.5. Phenergan Q 6 prn. Last BM: 5/18 Endo: Hx of DM. No insulin at home. CBGs 134-179 on 48 units of lantus and SSI. Glucose trending up slightly from TPN initiation.  Insulin requirements in the past 24 hours: 10 units of aspart; 48 units of lantus  Lytes: Na-136, K-3.6 (got 3 runs of potassium 5/22), CoCa-9.4, Mg- 2.3 (2 gm Mag sulfate given 5/22), Phos-2.6 Renal:AKI: Scr improved to 1.38. BUN-38. NS @ 35 ml/hr  Pulm: 6 L Casa ; Dulera Cards: Stanfod type B aortic dissection; BP soft-WNL, HR -90s-100s in ST. Esmolol gtt off 5/22 pm , lopressor Q 4, clonidine 0.2 mg patch .  Hepatobil:AST/ALT trending down 192/353. Alk phos-137>>112 Tbili-1.6>1.1 Triglycerides elevated 506 Neuro:Citalopram; Hydromorphone prn, Fentanyl patch  ID: D# [id number] Azith/Clinda for possible RLL pneumonia/colonic ischemia. WBC up to 12.7. Afebrile. Lactate 4.4>>2.9>>1.7. 5/21 cultures no growth to date.   Best Practices:PPI TPN Access:PICC line placed 5/20 TPN start date:5/22  Nutritional Goals (per RD recommendation on 5/22): KCal: 2000-2200 kcal/day  Protein: 110-120 gm/day Goal TPN rate: Clinimix 5/15 E @ 83 ml/hr + lipid emulsion 20 ml/hr (Provides 1900  kcal and 100 gm of protein)  Current Nutrition:  TPN  Plan:  Increase Clinimix E 5/15 to 60 ml/hr Hold lipids due to elevated triglycerides Continue IVMF NS at 35 ml/hr per MD  This provides 72 g of protein and 1022 kCals per day meeting 65 % of protein and 51 % of kCal needs Daily MVI in TPN Trace Elements every other day - Next due 5/24 Continue SSI and adjust as needed  Monitor CMET, Mg, and Phos Monitor Triglycerides  Give 30 mEQ potassium chloride   Ihor Austin, PharmD PGY1 Pharmacy Resident Pager: [phone] 06/24/2016 7:34 AM

## 2016-06-24 NOTE — Progress Notes (Signed)
Subjective: Interval History: none..Still with abdominal soreness.  Objective: Vital signs in last 24 hours: Temp:  [97.6 F (36.4 C)-99.2 F (37.3 C)] 98 F (36.7 C) (05/23 0752) Pulse Rate:  [88-112] 98 (05/23 1000) Resp:  [16-24] 20 (05/23 1045) BP: (96-140)/(36-63) 131/60 (05/23 1045) SpO2:  [92 %-100 %] 96 % (05/23 1000) Weight:  [186 lb 15.2 oz (84.8 kg)] 186 lb 15.2 oz (84.8 kg) (05/23 0600)  Intake/Output from previous day: 05/22 0701 - 05/23 0700 In: 2578.1 [I.V.:2073.1; NG/GT:30; IV Piggyback:475] Out: 2065 [Urine:1065; Emesis/NG output:1000] Intake/Output this shift: Total I/O In: 325 [I.V.:175; IV Piggyback:150] Out: 130 [Urine:130]  Diffuse mild abdominal tenderness. 2+ popliteal and 2+ dorsalis pedis pulses bilaterally  Lab Results:  Recent Labs  06/23/16 0411 06/23/16 1608 06/24/16 0313  WBC 12.1*  --  12.7*  HGB 13.4 12.2 12.8  HCT 40.0 36.0 38.1  PLT 114*  --  95*   BMET  Recent Labs  06/23/16 0411 06/23/16 1608 06/24/16 0313  NA 136 141 136  K 4.0 3.6 3.6  CL 103 103 105  CO2 22  --  23  GLUCOSE 121* 121* 163*  BUN 33* 35* 38*  CREATININE 1.46* 1.50* 1.38*  CALCIUM 8.5*  --  8.4*    Studies/Results: US Abdomen Complete  Result Date: 06/02/2016 CLINICAL DATA:  Nausea and abdominal distention EXAM: ABDOMEN ULTRASOUND COMPLETE COMPARISON:  None. FINDINGS: Gallbladder: Surgically removed Common bile duct: Diameter: 6 mm Liver: Increased in echogenicity consistent with fatty infiltration. No significant nodularity is noted. IVC: No abnormality visualized. Pancreas: Visualized portion unremarkable. Spleen: Size and appearance within normal limits. Right Kidney: Length: 12.5 cm. Echogenicity within normal limits. No mass or hydronephrosis visualized. Left Kidney: Length: 13.6 cm. Echogenicity within normal limits. No mass or hydronephrosis visualized. 2.2 cm cyst is noted in the midportion of the kidney anteriorly. This has increased in size from  prior exam at which time it measured approximately 17 mm. Abdominal aorta: No aneurysm visualized. Other findings: None. IMPRESSION: Simple Left renal cyst increased in size by 5 mm. Status post cholecystectomy. Fatty infiltration of the liver without focal mass. Electronically Signed   By: Inez Catalina M.D.   On: 06/02/2016 08:55   Dg Chest Port 1 View  Result Date: 06/24/2016 CLINICAL DATA:  Shortness of Breath EXAM: PORTABLE CHEST 1 VIEW COMPARISON:  06/23/2016 FINDINGS: Right-sided PICC line is again noted at the cavoatrial junction. Nasogastric catheter is noted in satisfactory position through the overall inspiratory effort is decreased from the prior exam. Mild left basilar atelectasis remains. No other focal abnormality is noted. IMPRESSION: Stable left basilar atelectasis. Electronically Signed   By: Inez Catalina M.D.   On: 06/24/2016 08:07   Dg Chest Port 1 View  Result Date: 06/23/2016 CLINICAL DATA:  61 year old female with shortness of breath and abdominal pain. 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 Being treated conservatively at this time, including with bowel rest. EXAM: PORTABLE CHEST 1 VIEW COMPARISON:  Chest radiographs 06/22/2016 and earlier. CTA 06/20/2016 FINDINGS: Portable AP semi upright view at 0754 hours. Stable enteric tube which courses to the abdomen. Stable right PICC line. Mildly larger lung volumes and decreased streaky bibasilar opacity. Mild residual. No pneumothorax or pulmonary edema. No pleural effusion or consolidation. Stable cardiac size and mediastinal contours. IMPRESSION: 1.  Stable lines and tubes. 2. Mildly improved lung volumes and regressed bibasilar opacity favored to be atelectasis. Electronically  Signed   By: Genevie Ann M.D.   On: 06/23/2016 08:19   Dg Chest Port 1 View  Result Date: 06/22/2016 CLINICAL DATA:  61 year old female with history  of abdominal aortic aneurysm. Shortness of breath and vomiting. EXAM: PORTABLE CHEST 1 VIEW COMPARISON:  Chest x-ray 06/21/2016. FINDINGS: There is a right upper extremity PICC with tip terminating in the superior cavoatrial junction. A nasogastric tube is seen extending into the stomach, however, the tip of the nasogastric tube extends below the lower margin of the image. Lung volumes are low. Linear bibasilar opacities favored to reflect areas of subsegmental atelectasis, however, underlying airspace consolidation from infection or aspiration is not excluded. No pleural effusions. No evidence of pulmonary edema. Heart size is normal. Upper mediastinal contours are within normal limits. IMPRESSION: 1. Support apparatus, as above. 2. Low lung volumes with bibasilar areas of atelectasis and/or airspace consolidation. Electronically Signed   By: Vinnie Langton M.D.   On: 06/22/2016 07:30   Dg Chest Port 1 View  Result Date: 06/21/2016 CLINICAL DATA:  Aortic dissection EXAM: PORTABLE CHEST 1 VIEW COMPARISON:  06/20/2016 FINDINGS: Bibasilar atelectasis. Heart is normal size. No effusions or acute bony abnormality. IMPRESSION: Bibasilar atelectasis. Electronically Signed   By: Rolm Baptise M.D.   On: 06/21/2016 07:18   Dg Chest Port 1 View  Result Date: 06/20/2016 CLINICAL DATA:  Hypoxia, unresponsive EXAM: PORTABLE CHEST 1 VIEW COMPARISON:  CTA chest dated 06/20/2016 FINDINGS: Lungs are essentially clear. No focal consolidation. No pleural effusion or pneumothorax. The heart is top-normal in size. IMPRESSION: No evidence of acute cardiopulmonary disease. Electronically Signed   By: Julian Hy M.D.   On: 06/20/2016 17:01   Dg Chest Port 1 View  Result Date: 06/20/2016 CLINICAL DATA:  Sudden onset sharp substernal chest pain that radiates to the back. Nausea, dizziness and shortness of breath. EXAM: PORTABLE CHEST 1 VIEW COMPARISON:  None. FINDINGS: Trachea is midline. Heart size is accentuated by AP  supine technique. Probable mild scarring in the right middle lobe and lingula. Lungs are otherwise clear. No pleural fluid. IMPRESSION: No acute findings. Electronically Signed   By: Lorin Picket M.D.   On: 06/20/2016 09:56   Dg Abd Portable 1v  Result Date: 06/24/2016 CLINICAL DATA:  Abdominal distention. EXAM: PORTABLE ABDOMEN - 1 VIEW COMPARISON:  06/23/2016 . FINDINGS: NG tube noted in stable position with tip in the stomach . Surgical clips right upper quadrant. Several nonspecific loops of air-filled small bowel again noted. Colonic gas pattern is normal. No free air. IMPRESSION: 1.  NG tube in stable position. 2. Several nonspecific loops of air-filled small bowel again noted. No evidence of progressive bowel distention. Colonic gas pattern normal. No free air. Electronically Signed   By: Marcello Moores  Register   On: 06/24/2016 08:06   Dg Abd Portable 1v  Result Date: 06/23/2016 CLINICAL DATA:  61 year old female with shortness of breath and abdominal pain. 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 Being treated conservatively at this time, including with bowel rest. EXAM: PORTABLE ABDOMEN - 1 VIEW COMPARISON:  Abdominal radiographs 06/22/2016. CTA 06/20/2016, and earlier. FINDINGS: Portable AP supine view at 0759 hours. Stable NG tube, side hole to level of the gastric body and tip at the level of the gastric antrum. Stable cholecystectomy clips. Stable bowel gas pattern with several gas containing nondilated small and large bowel loops. No definite pneumoperitoneum on this supine view.  Abdominal and pelvic visceral contours appear stable. No acute osseous abnormality identified. IMPRESSION: 1. Stable NG tube position. 2. Stable, nonobstructed bowel-gas pattern. Electronically Signed   By: Genevie Ann M.D.   On: 06/23/2016 08:18   Dg Abd Portable 1v  Result Date: 06/22/2016 CLINICAL DATA:   Abdominal pain for few days with nausea and vomiting. EXAM: PORTABLE ABDOMEN - 1 VIEW COMPARISON:  06/22/2016 abdominal radiograph. FINDINGS: Enteric tube loops in the gastric fundus and terminates in the body of the stomach, without appreciable kink. Top-normal caliber small bowel loops throughout the abdomen. Minimal colonic stool. No evidence of pneumatosis or pneumoperitoneum. No radiopaque urolithiasis. Cholecystectomy clips are seen in the right upper quadrant of the abdomen. Patchy opacities at the lung bases. IMPRESSION: 1. Enteric tube loops in the gastric fundus and terminates in the body of the stomach without appreciable kink. 2. Top-normal caliber small bowel loops, unchanged. 3. Patchy bibasilar lung opacities, correlate with chest radiograph. Electronically Signed   By: Ilona Sorrel M.D.   On: 06/22/2016 16:58   Dg Abd Portable 1v  Result Date: 06/22/2016 CLINICAL DATA:  Vomiting.  Shortness of breath.  AAA . EXAM: PORTABLE ABDOMEN - 1 VIEW COMPARISON:  06/21/2016. FINDINGS: Surgical clips right upper quadrant. NG tube noted with tip over the distal stomach. No bowel distention. No acute bony abnormality identified. Mild basilar atelectasis. IMPRESSION: NG tube noted with its tip over the distal stomach. No bowel distention or acute intra-abdominal abnormality identified. Electronically Signed   By: Marcello Moores  Register   On: 06/22/2016 07:30   Dg Abd Portable 1v  Result Date: 06/21/2016 CLINICAL DATA:  Feeding tube placement EXAM: PORTABLE ABDOMEN - 1 VIEW COMPARISON:  06/21/2016 FINDINGS: NG tube coils in the fundus of the stomach with the tip in the distal stomach. Decompression of the stomach. IMPRESSION: NG tube tip in the distal stomach. Electronically Signed   By: Rolm Baptise M.D.   On: 06/21/2016 15:46   Dg Abd Portable 1v  Result Date: 06/21/2016 CLINICAL DATA:  Ileus  Vomiting that started this AM per patient EXAM: PORTABLE ABDOMEN - 1 VIEW COMPARISON:  06/20/2016 FINDINGS: Gaseous  distention of the stomach. Paucity of small bowel and colonic gas. Cholecystectomy clips. Linear scarring/ atelectasis in the lung bases. IMPRESSION: 1. Gaseous distention of the stomach. Electronically Signed   By: Lucrezia Europe M.D.   On: 06/21/2016 11:54   Dg Abd Portable 1v  Result Date: 06/20/2016 CLINICAL DATA:  Aortic dissection EXAM: PORTABLE ABDOMEN - 1 VIEW COMPARISON:  CT abdomen/ pelvis dated 06/20/2016 FINDINGS: Nonobstructive bowel gas pattern. Visualized osseous structures are within normal limits. IMPRESSION: Unremarkable abdominal radiograph. Electronically Signed   By: Julian Hy M.D.   On: 06/20/2016 17:02   Ct Angio Chest/abd/pel For Dissection W And/or W/wo  Result Date: 06/20/2016 CLINICAL DATA:  Substernal chest pain radiating to back EXAM: CT ANGIOGRAPHY CHEST, ABDOMEN AND PELVIS TECHNIQUE: Multidetector CT imaging through the chest, abdomen and pelvis was performed using the standard protocol during bolus administration of intravenous contrast. Multiplanar reconstructed images and MIPs were obtained and reviewed to evaluate the vascular anatomy. CONTRAST:  100 cc Isovue 370 IV COMPARISON:  CT abdomen and pelvis 10/27/2011 FINDINGS: CTA CHEST FINDINGS Cardiovascular: There is the type B aortic dissection beginning in the distal aortic arch just beyond the origin of the great vessels. The true lumen is compressed by the false lumen. No aneurysm. No pulmonary embolus. Heart is normal size. Mediastinum/Nodes: No mediastinal, hilar, or axillary adenopathy. Lungs/Pleura: Atelectasis  or scarring in the lingula. Lungs otherwise clear. No effusions. Musculoskeletal: No acute bony abnormality. Review of the MIP images confirms the above findings. CTA ABDOMEN AND PELVIS FINDINGS VASCULAR Aorta: Dissection continues throughout the abdominal aorta with the celiac artery and superior mesenteric artery arising from the true lumen anteriorly. The dissection may extend into the left renal artery  where there is only a small amount of blood flow noted in the proximal and mid left renal artery. Right renal artery appears to arise from the false lumen and is patent. The the true lumen appears thrombosed below the origin of the superior mesenteric artery. Inferior mesenteric artery arises from the thrombosed true lumen and appears occluded proximally, reconstitutes several cm from the origin. Celiac: Patent. SMA: Patent. Renals: As above. IMA: As above. Inflow: To the dissection continues into both common iliac arteries with thrombosed true lumens. The dissection appears to terminate at approximately the level of the common iliac bifurcation bilaterally. Veins: Grossly patent and unremarkable. Review of the MIP images confirms the above findings. NON-VASCULAR Hepatobiliary: Mild diffuse fatty infiltration. Prior cholecystectomy. Pancreas: No focal abnormality or ductal dilatation. Spleen: No focal abnormality.  Normal size. Adrenals/Urinary Tract: Nodules in the left adrenal gland are low-density on the precontrast imaging compatible with small adenomas. There are areas of non perfusion noted in the mid and lower poles of the left kidney compatible with infarction, likely related to the involvement of the left renal artery by dissection. Urinary bladder unremarkable. Stomach/Bowel: Stomach, large and small bowel grossly unremarkable. Lymphatic: No adenopathy. Reproductive: Prior hysterectomy.  No adnexal masses. Other: No free fluid or free air. Musculoskeletal: No acute bony abnormality. Review of the MIP images confirms the above findings. 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 This involves the descending thoracic aorta, abdominal aorta and iliac vessels. The true lumen is compressed by the larger false lumen, and is thrombosed below the SMA/renal artery origins. The dissection appears to extend into the left renal artery with partial  occlusion and areas of infarct in the mid and lower pole of the left kidney. Fatty infiltration of the liver. Critical Value/emergent results were called by telephone at the time of interpretation on 06/20/2016 at 10:13 am to Dr. Fredia Sorrow , who verbally acknowledged these results. Electronically Signed   By: Rolm Baptise M.D.   On: 06/20/2016 10:15   Anti-infectives: Anti-infectives    Start     Dose/Rate Route Frequency Ordered Stop   06/22/16 1800  clindamycin (CLEOCIN) IVPB 600 mg     600 mg 100 mL/hr over 30 Minutes Intravenous Every 8 hours 06/22/16 1723     06/22/16 1000  azithromycin (ZITHROMAX) 250 mg in dextrose 5 % 125 mL IVPB     250 mg 125 mL/hr over 60 Minutes Intravenous Every 24 hours 06/22/16 0826     06/22/16 0900  levofloxacin (LEVAQUIN) IVPB 500 mg  Status:  Discontinued     500 mg 100 mL/hr over 60 Minutes Intravenous Every 24 hours 06/22/16 0759 06/22/16 0824      Assessment/Plan: s/p * No surgery found * Stable overall. Continued bowel rest with probable mesenteric ischemia. Normal flow to lower extremities via false lumen. No active vascular issues. Will follow from the side lines   LOS: 4 days   Niccole Witthuhn 06/24/2016, 2:00 PM

## 2016-06-25 ENCOUNTER — Inpatient Hospital Stay (HOSPITAL_COMMUNITY): Payer: BLUE CROSS/BLUE SHIELD

## 2016-06-25 LAB — CALCIUM, IONIZED: Calcium, Ionized, Serum: 4.9 mg/dL (ref 4.5–5.6)

## 2016-06-25 LAB — CBC
HCT: 34.1 % — ABNORMAL LOW (ref 36.0–46.0)
Hemoglobin: 11.1 g/dL — ABNORMAL LOW (ref 12.0–15.0)
MCH: 32.7 pg (ref 26.0–34.0)
MCHC: 32.6 g/dL (ref 30.0–36.0)
MCV: 100.6 fL — ABNORMAL HIGH (ref 78.0–100.0)
Platelets: 87 10*3/uL — ABNORMAL LOW (ref 150–400)
RBC: 3.39 MIL/uL — ABNORMAL LOW (ref 3.87–5.11)
RDW: 13.7 % (ref 11.5–15.5)
WBC: 15.7 10*3/uL — ABNORMAL HIGH (ref 4.0–10.5)

## 2016-06-25 LAB — COMPREHENSIVE METABOLIC PANEL
ALT: 238 U/L — ABNORMAL HIGH (ref 14–54)
AST: 116 U/L — ABNORMAL HIGH (ref 15–41)
Albumin: 2.6 g/dL — ABNORMAL LOW (ref 3.5–5.0)
Alkaline Phosphatase: 89 U/L (ref 38–126)
Anion gap: 7 (ref 5–15)
BUN: 41 mg/dL — ABNORMAL HIGH (ref 6–20)
CO2: 23 mmol/L (ref 22–32)
Calcium: 8.6 mg/dL — ABNORMAL LOW (ref 8.9–10.3)
Chloride: 107 mmol/L (ref 101–111)
Creatinine, Ser: 1.17 mg/dL — ABNORMAL HIGH (ref 0.44–1.00)
GFR calc Af Amer: 58 mL/min — ABNORMAL LOW (ref >60)
GFR calc non Af Amer: 50 mL/min — ABNORMAL LOW (ref >60)
Glucose, Bld: 193 mg/dL — ABNORMAL HIGH (ref 65–99)
Potassium: 3.8 mmol/L (ref 3.5–5.1)
Sodium: 137 mmol/L (ref 135–145)
Total Bilirubin: 0.8 mg/dL (ref 0.3–1.2)
Total Protein: 5.4 g/dL — ABNORMAL LOW (ref 6.5–8.1)

## 2016-06-25 LAB — MAGNESIUM: Magnesium: 2.2 mg/dL (ref 1.7–2.4)

## 2016-06-25 LAB — GLUCOSE, CAPILLARY
Glucose-Capillary: 141 mg/dL — ABNORMAL HIGH (ref 65–99)
Glucose-Capillary: 177 mg/dL — ABNORMAL HIGH (ref 65–99)
Glucose-Capillary: 178 mg/dL — ABNORMAL HIGH (ref 65–99)
Glucose-Capillary: 183 mg/dL — ABNORMAL HIGH (ref 65–99)
Glucose-Capillary: 183 mg/dL — ABNORMAL HIGH (ref 65–99)
Glucose-Capillary: 194 mg/dL — ABNORMAL HIGH (ref 65–99)
Glucose-Capillary: 198 mg/dL — ABNORMAL HIGH (ref 65–99)

## 2016-06-25 LAB — THIOCYANATE, BLOOD: Thiocyanate: 0.8 mg/dl

## 2016-06-25 LAB — AMYLASE: Amylase: 127 U/L — ABNORMAL HIGH (ref 28–100)

## 2016-06-25 LAB — PHOSPHORUS: Phosphorus: 3.1 mg/dL (ref 2.5–4.6)

## 2016-06-25 MED ORDER — METOPROLOL TARTRATE 50 MG PO TABS
50.0000 mg | ORAL_TABLET | Freq: Two times a day (BID) | ORAL | Status: DC
  Administered 2016-06-25 (×2): 50 mg via ORAL
  Filled 2016-06-25 (×2): qty 1

## 2016-06-25 MED ORDER — FUROSEMIDE 10 MG/ML IJ SOLN
20.0000 mg | Freq: Two times a day (BID) | INTRAMUSCULAR | Status: DC
  Administered 2016-06-25 (×2): 20 mg via INTRAVENOUS
  Filled 2016-06-25 (×2): qty 2

## 2016-06-25 MED ORDER — TRACE MINERALS CR-CU-MN-SE-ZN 10-1000-500-60 MCG/ML IV SOLN
INTRAVENOUS | Status: DC
  Administered 2016-06-25: 17:00:00 via INTRAVENOUS
  Filled 2016-06-25: qty 1992

## 2016-06-25 MED ORDER — INSULIN GLARGINE 100 UNIT/ML ~~LOC~~ SOLN
30.0000 [IU] | Freq: Two times a day (BID) | SUBCUTANEOUS | Status: DC
  Administered 2016-06-25: 30 [IU] via SUBCUTANEOUS
  Filled 2016-06-25 (×2): qty 0.3

## 2016-06-25 MED ORDER — BISACODYL 10 MG RE SUPP
10.0000 mg | Freq: Every morning | RECTAL | Status: DC
  Administered 2016-06-25 – 2016-07-01 (×6): 10 mg via RECTAL
  Filled 2016-06-25 (×6): qty 1

## 2016-06-25 MED ORDER — POTASSIUM CHLORIDE 10 MEQ/50ML IV SOLN
10.0000 meq | INTRAVENOUS | Status: AC
  Administered 2016-06-25 (×4): 10 meq via INTRAVENOUS
  Filled 2016-06-25 (×4): qty 50

## 2016-06-25 NOTE — Progress Notes (Signed)
Subjective/Chief Complaint: Pt with feeling tired this AM. Sitting up in chair.   Objective: Vital signs in last 24 hours: Temp:  [97.6 F (36.4 C)-98.5 F (36.9 C)] 97.9 F (36.6 C) (05/24 0400) Pulse Rate:  [69-98] 72 (05/24 0500) Resp:  [17-23] 23 (05/24 0500) BP: (102-159)/(45-74) 136/55 (05/24 0500) SpO2:  [95 %-100 %] 100 % (05/24 0500) Weight:  [85.8 kg (189 lb 2.5 oz)] 85.8 kg (189 lb 2.5 oz) (05/24 0500) Last BM Date: 06/19/16  Intake/Output from previous day: 05/23 0701 - 05/24 0700 In: 728 [I.V.:528; IV Piggyback:200] Out: 0737 [Urine:1115; Emesis/NG output:550] Intake/Output this shift: No intake/output data recorded.  General appearance: alert and cooperative GI: soft, less ttp, BLQ, hypoactive BS, no rebound/fuarding  Lab Results:   Recent Labs  06/24/16 0313  06/24/16 1721 06/25/16 0643  WBC 12.7*  --   --  15.7*  HGB 12.8  < > 11.9* 11.1*  HCT 38.1  < > 35.0* 34.1*  PLT 95*  --   --  87*  < > = values in this interval not displayed. BMET  Recent Labs  06/24/16 0313  06/24/16 1721 06/25/16 0643  NA 136  < > 139 137  K 3.6  < > 3.6 3.8  CL 105  < > 105 107  CO2 23  --   --  23  GLUCOSE 163*  < > 179* 193*  BUN 38*  < > 38* 41*  CREATININE 1.38*  < > 1.40* 1.17*  CALCIUM 8.4*  --   --  8.6*  < > = values in this interval not displayed. PT/INR No results for input(s): LABPROT, INR in the last 72 hours. ABG  Recent Labs  06/23/16 0338 06/24/16 0332  PHART 7.502* 7.430  HCO3 24.4 21.6    Studies/Results: Dg Chest Port 1 View  Result Date: 06/24/2016 CLINICAL DATA:  Shortness of Breath EXAM: PORTABLE CHEST 1 VIEW COMPARISON:  06/23/2016 FINDINGS: Right-sided PICC line is again noted at the cavoatrial junction. Nasogastric catheter is noted in satisfactory position through the overall inspiratory effort is decreased from the prior exam. Mild left basilar atelectasis remains. No other focal abnormality is noted. IMPRESSION: Stable  left basilar atelectasis. Electronically Signed   By: Inez Catalina M.D.   On: 06/24/2016 08:07   Dg Chest Port 1 View  Result Date: 06/23/2016 CLINICAL DATA:  61 year old female with shortness of breath and abdominal pain. 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 Being treated conservatively at this time, including with bowel rest. EXAM: PORTABLE CHEST 1 VIEW COMPARISON:  Chest radiographs 06/22/2016 and earlier. CTA 06/20/2016 FINDINGS: Portable AP semi upright view at 0754 hours. Stable enteric tube which courses to the abdomen. Stable right PICC line. Mildly larger lung volumes and decreased streaky bibasilar opacity. Mild residual. No pneumothorax or pulmonary edema. No pleural effusion or consolidation. Stable cardiac size and mediastinal contours. IMPRESSION: 1.  Stable lines and tubes. 2. Mildly improved lung volumes and regressed bibasilar opacity favored to be atelectasis. Electronically Signed   By: Genevie Ann M.D.   On: 06/23/2016 08:19   Dg Abd Portable 1v  Result Date: 06/24/2016 CLINICAL DATA:  Abdominal distention. EXAM: PORTABLE ABDOMEN - 1 VIEW COMPARISON:  06/23/2016 . FINDINGS: NG tube noted in stable position with tip in the stomach . Surgical clips right upper quadrant. Several nonspecific loops of air-filled small bowel again noted. Colonic gas pattern is  normal. No free air. IMPRESSION: 1.  NG tube in stable position. 2. Several nonspecific loops of air-filled small bowel again noted. No evidence of progressive bowel distention. Colonic gas pattern normal. No free air. Electronically Signed   By: Marcello Moores  Register   On: 06/24/2016 08:06   Dg Abd Portable 1v  Result Date: 06/23/2016 CLINICAL DATA:  61 year old female with shortness of breath and abdominal pain. 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 True lumen occlusion below the SMA. Being treated conservatively at this time, including with bowel rest. EXAM: PORTABLE ABDOMEN - 1 VIEW COMPARISON:  Abdominal radiographs 06/22/2016. CTA 06/20/2016, and earlier. FINDINGS: Portable AP supine view at 0759 hours. Stable NG tube, side hole to level of the gastric body and tip at the level of the gastric antrum. Stable cholecystectomy clips. Stable bowel gas pattern with several gas containing nondilated small and large bowel loops. No definite pneumoperitoneum on this supine view. Abdominal and pelvic visceral contours appear stable. No acute osseous abnormality identified. IMPRESSION: 1. Stable NG tube position. 2. Stable, nonobstructed bowel-gas pattern. Electronically Signed   By: Genevie Ann M.D.   On: 06/23/2016 08:18    Anti-infectives: Anti-infectives    Start     Dose/Rate Route Frequency Ordered Stop   06/22/16 1800  clindamycin (CLEOCIN) IVPB 600 mg     600 mg 100 mL/hr over 30 Minutes Intravenous Every 8 hours 06/22/16 1723     06/22/16 1000  azithromycin (ZITHROMAX) 250 mg in dextrose 5 % 125 mL IVPB     250 mg 125 mL/hr over 60 Minutes Intravenous Every 24 hours 06/22/16 0826     06/22/16 0900  levofloxacin (LEVAQUIN) IVPB 500 mg  Status:  Discontinued     500 mg 100 mL/hr over 60 Minutes Intravenous Every 24 hours 06/22/16 0759 06/22/16 0824      Assessment/Plan: Type B thoracic aortic dissection  - superior mesenteri/celiac open thru true lumen, decreased left kidney flow, Celiac axis and SMA patebt IMA occluded but reconstitutes via collaterals Abdominal pain - Low chance for ischemia.  Pt with likely ileus, but slowly resolving.  Pt with gastroparesis which will complicate things with her ileus Tobacco use Gastroparesis/GERD IBS Hypertension Type II diabetes FEN:  IV fluids/NPO/NG - clamping trials as tol ID:  Azithromycin/Clindamycin 5/21 =>> day 3 DVT:  SCD's    LOS: 5 days    Rosario Jacks.,  [facility] 06/25/2016

## 2016-06-25 NOTE — Progress Notes (Addendum)
PHARMACY - ADULT TOTAL PARENTERAL NUTRITION CONSULT NOTE   Pharmacy Consult for TPN Indication: prolonged NPO status/high NG output   Patient Measurements: Height: 5' 3" (160 cm) Weight: 189 lb 2.5 oz (85.8 kg) IBW/kg (Calculated) : 52.4 TPN AdjBW (KG): 60.3 Body mass index is 33.51 kg/m. Usual Weight: 82 kg   Assessment: 60 yo female who presented on 5/19 with a Stanford type B aortic dissection to left subclavian. Patient had a significant hx of N/V for 2-4 weeks prior to admission. Weight appears to have been maintained. Patient had a NG tube placed 5/20 which has has significant output with 1-1.5 L a day.   GI: NG tube in place for drainage (550 mL out 5/23). Continued abdominal soreness.Prealbumin-14.8 and albumin up to 2.6. Phenergan Q 6 prn. Last BM: 5/18- Bisacodyl every morning Endo: Hx of DM. No insulin at home. CBGs 163-193 on 48 units of lantus and SSI. Glucose trending up slightly from TPN initiation.  Insulin requirements in the past 24 hours: 22 units of aspart; 48 units of lantus  Lytes: Na-137, K-3.8 (got 3 runs of potassium 5/23), CoCa-9.5, Mg- 2.2  Phos-3.1 Renal:AKI: Scr improved to 1.17 (close to baseline). BUN-41. NS @ 35 ml/hr  Pulm: 4 L Hamilton ; Dulera Cards: Stanford type B aortic dissection; BP WNL-high, HR -60s-70s in NSR. Esmolol gtt back on, lopressor Q 4 + 50 mg PO BID, clonidine 0.2 mg patch .  Hepatobil:AST/ALT trending down 116/238. Alk phos, Tbili WNL. Triglycerides elevated 506 Neuro:Citalopram; Hydromorphone prn, Fentanyl patch  ID: D# [id number] Azith/Clinda for possible RLL pneumonia/colonic ischemia. WBC up to 15.7. Afebrile. Lactate 4.4>>2.9>>1.7. 5/21 cultures no growth to date.   Best Practices:PPI TPN Access:PICC line placed 5/20 TPN start date:5/22  Nutritional Goals (per RD recommendation on 5/22): KCal: 2000-2200 kcal/day  Protein: 110-120 gm/day Goal TPN rate: Clinimix 5/15 E @ 83 ml/hr + lipid emulsion 20 ml/hr (Provides 1900 kcal and 100 gm of  protein)  Current Nutrition:  TPN  Plan:  Increase Clinimix E 5/15 to 83 ml/hr Hold lipids due to elevated triglycerides Continue IVMF NS at 35 ml/hr per MD  This provides 100 g of protein and 1415 kCals per day meeting 91 % of protein and 71 % of kCal needs Daily MVI in TPN Trace Elements every other day - Next due 5/24 Increase Lantus to 30 units BID  Continue SSI and adjust as needed  Monitor CMET, Mg, and Phos Monitor Triglycerides  Give potassium chloride X 4 runs (40 meQ)   Emily A Stewart, PharmD PGY1 Pharmacy Resident Pager: [phone] 06/25/2016 7:34 AM  

## 2016-06-25 NOTE — Progress Notes (Signed)
PT Cancellation Note  Patient Details Name: Haley Graham MRN: [id number] DOB: 10-Mar-1955   Cancelled Treatment:    Reason Eval/Treat Not Completed: Medical issues which prohibited therapy. Pt with nausea and fatigue. Instructed pt in ankle pumps and quad sets.   [place] 06/25/2016, 4:10 PM [place]

## 2016-06-25 NOTE — Progress Notes (Signed)
  Subjective: Feels better Less lower abdominal pain Back on iv esmolol for BP control NG output decreased- will clamp tube 6 hrs then remove if tolerated by patient Plan CTA of aorta tomorrow- creat  1.17 , Bun 40 Objective: Vital signs in last 24 hours: Temp:  [97.6 F (36.4 C)-98.5 F (36.9 C)] 98.2 F (36.8 C) (05/24 1145) Pulse Rate:  [69-93] 77 (05/24 1245) Cardiac Rhythm: Normal sinus rhythm (05/24 1200) Resp:  [14-24] 19 (05/24 1245) BP: (102-159)/(45-82) 125/56 (05/24 1245) SpO2:  [92 %-100 %] 92 % (05/24 1245) Weight:  [189 lb 2.5 oz (85.8 kg)] 189 lb 2.5 oz (85.8 kg) (05/24 0500)  Hemodynamic parameters for last 24 hours:  nsr  Intake/Output from previous day: 05/23 0701 - 05/24 0700 In: 2317 [I.V.:2117; IV Piggyback:200] Out: 2446 [Urine:1115; Emesis/NG output:550] Intake/Output this shift: Total I/O In: 860 [I.V.:760; IV Piggyback:100] Out: 920 [Urine:620; Emesis/NG output:300]       Exam    General- alert and comfortable   Lungs- clear without rales, wheezes   Cor- regular rate and rhythm, no murmur , gallop   Abdomen- soft, non-tender   Extremities - warm, non-tender, minimal edema, L foot dusky   Neuro- oriented, appropriate, no focal weakness   Lab Results:  Recent Labs  06/24/16 0313  06/24/16 1721 06/25/16 0643  WBC 12.7*  --   --  15.7*  HGB 12.8  < > 11.9* 11.1*  HCT 38.1  < > 35.0* 34.1*  PLT 95*  --   --  87*  < > = values in this interval not displayed. BMET:  Recent Labs  06/24/16 0313  06/24/16 1721 06/25/16 0643  NA 136  < > 139 137  K 3.6  < > 3.6 3.8  CL 105  < > 105 107  CO2 23  --   --  23  GLUCOSE 163*  < > 179* 193*  BUN 38*  < > 38* 41*  CREATININE 1.38*  < > 1.40* 1.17*  CALCIUM 8.4*  --   --  8.6*  < > = values in this interval not displayed.  PT/INR: No results for input(s): LABPROT, INR in the last 72 hours. ABG    Component Value Date/Time   PHART 7.430 06/24/2016 0332   HCO3 21.6 06/24/2016 0332   TCO2 23 06/24/2016 1721   ACIDBASEDEF 2.0 06/24/2016 0332   O2SAT 93.5 06/24/2016 0332   CBG (last 3)   Recent Labs  06/25/16 0352 06/25/16 0747 06/25/16 1143  GLUCAP 194* 178* 198*    Assessment/Plan: S/P  Dusky toes- plt count dropping - check HIT Blood cultures negative   LOS: 5 days    [name] 06/25/2016

## 2016-06-25 NOTE — Progress Notes (Signed)
Subjective: Interval History: none.. Change in abdominal discomfort.  Objective: Vital signs in last 24 hours: Temp:  [97.6 F (36.4 C)-98.5 F (36.9 C)] 97.9 F (36.6 C) (05/24 0400) Pulse Rate:  [69-98] 72 (05/24 0500) Resp:  [17-23] 23 (05/24 0500) BP: (102-159)/(45-74) 136/55 (05/24 0500) SpO2:  [95 %-100 %] 100 % (05/24 0747) Weight:  [189 lb 2.5 oz (85.8 kg)] 189 lb 2.5 oz (85.8 kg) (05/24 0500)  Intake/Output from previous day: 05/23 0701 - 05/24 0700 In: 728 [I.V.:528; IV Piggyback:200] Out: 7001 [Urine:1115; Emesis/NG output:550] Intake/Output this shift: No intake/output data recorded.  Abdominal exam unchanged. Diffuse moderate tenderness. 2-3+ dorsalis pedis pulses bilaterally. Some mottling in her toes but improved since yesterday  Lab Results:  Recent Labs  06/24/16 0313  06/24/16 1721 06/25/16 0643  WBC 12.7*  --   --  15.7*  HGB 12.8  < > 11.9* 11.1*  HCT 38.1  < > 35.0* 34.1*  PLT 95*  --   --  87*  < > = values in this interval not displayed. BMET  Recent Labs  06/24/16 0313  06/24/16 1721 06/25/16 0643  NA 136  < > 139 137  K 3.6  < > 3.6 3.8  CL 105  < > 105 107  CO2 23  --   --  23  GLUCOSE 163*  < > 179* 193*  BUN 38*  < > 38* 41*  CREATININE 1.38*  < > 1.40* 1.17*  CALCIUM 8.4*  --   --  8.6*  < > = values in this interval not displayed.  Studies/Results: US Abdomen Complete  Result Date: 06/02/2016 CLINICAL DATA:  Nausea and abdominal distention EXAM: ABDOMEN ULTRASOUND COMPLETE COMPARISON:  None. FINDINGS: Gallbladder: Surgically removed Common bile duct: Diameter: 6 mm Liver: Increased in echogenicity consistent with fatty infiltration. No significant nodularity is noted. IVC: No abnormality visualized. Pancreas: Visualized portion unremarkable. Spleen: Size and appearance within normal limits. Right Kidney: Length: 12.5 cm. Echogenicity within normal limits. No mass or hydronephrosis visualized. Left Kidney: Length: 13.6 cm.  Echogenicity within normal limits. No mass or hydronephrosis visualized. 2.2 cm cyst is noted in the midportion of the kidney anteriorly. This has increased in size from prior exam at which time it measured approximately 17 mm. Abdominal aorta: No aneurysm visualized. Other findings: None. IMPRESSION: Simple Left renal cyst increased in size by 5 mm. Status post cholecystectomy. Fatty infiltration of the liver without focal mass. Electronically Signed   By: Inez Catalina M.D.   On: 06/02/2016 08:55   Dg Chest Port 1 View  Result Date: 06/24/2016 CLINICAL DATA:  Shortness of Breath EXAM: PORTABLE CHEST 1 VIEW COMPARISON:  06/23/2016 FINDINGS: Right-sided PICC line is again noted at the cavoatrial junction. Nasogastric catheter is noted in satisfactory position through the overall inspiratory effort is decreased from the prior exam. Mild left basilar atelectasis remains. No other focal abnormality is noted. IMPRESSION: Stable left basilar atelectasis. Electronically Signed   By: Inez Catalina M.D.   On: 06/24/2016 08:07   Dg Chest Port 1 View  Result Date: 06/23/2016 CLINICAL DATA:  61 year old female with shortness of breath and abdominal pain. 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 Being treated conservatively at this time, including with bowel rest. EXAM: PORTABLE CHEST 1 VIEW COMPARISON:  Chest radiographs 06/22/2016 and earlier. CTA 06/20/2016 FINDINGS: Portable AP semi upright view at 0754 hours. Stable enteric tube which  courses to the abdomen. Stable right PICC line. Mildly larger lung volumes and decreased streaky bibasilar opacity. Mild residual. No pneumothorax or pulmonary edema. No pleural effusion or consolidation. Stable cardiac size and mediastinal contours. IMPRESSION: 1.  Stable lines and tubes. 2. Mildly improved lung volumes and regressed bibasilar opacity favored to be  atelectasis. Electronically Signed   By: Genevie Ann M.D.   On: 06/23/2016 08:19   Dg Chest Port 1 View  Result Date: 06/22/2016 CLINICAL DATA:  61 year old female with history of abdominal aortic aneurysm. Shortness of breath and vomiting. EXAM: PORTABLE CHEST 1 VIEW COMPARISON:  Chest x-ray 06/21/2016. FINDINGS: There is a right upper extremity PICC with tip terminating in the superior cavoatrial junction. A nasogastric tube is seen extending into the stomach, however, the tip of the nasogastric tube extends below the lower margin of the image. Lung volumes are low. Linear bibasilar opacities favored to reflect areas of subsegmental atelectasis, however, underlying airspace consolidation from infection or aspiration is not excluded. No pleural effusions. No evidence of pulmonary edema. Heart size is normal. Upper mediastinal contours are within normal limits. IMPRESSION: 1. Support apparatus, as above. 2. Low lung volumes with bibasilar areas of atelectasis and/or airspace consolidation. Electronically Signed   By: Vinnie Langton M.D.   On: 06/22/2016 07:30   Dg Chest Port 1 View  Result Date: 06/21/2016 CLINICAL DATA:  Aortic dissection EXAM: PORTABLE CHEST 1 VIEW COMPARISON:  06/20/2016 FINDINGS: Bibasilar atelectasis. Heart is normal size. No effusions or acute bony abnormality. IMPRESSION: Bibasilar atelectasis. Electronically Signed   By: Rolm Baptise M.D.   On: 06/21/2016 07:18   Dg Chest Port 1 View  Result Date: 06/20/2016 CLINICAL DATA:  Hypoxia, unresponsive EXAM: PORTABLE CHEST 1 VIEW COMPARISON:  CTA chest dated 06/20/2016 FINDINGS: Lungs are essentially clear. No focal consolidation. No pleural effusion or pneumothorax. The heart is top-normal in size. IMPRESSION: No evidence of acute cardiopulmonary disease. Electronically Signed   By: Julian Hy M.D.   On: 06/20/2016 17:01   Dg Chest Port 1 View  Result Date: 06/20/2016 CLINICAL DATA:  Sudden onset sharp substernal chest pain  that radiates to the back. Nausea, dizziness and shortness of breath. EXAM: PORTABLE CHEST 1 VIEW COMPARISON:  None. FINDINGS: Trachea is midline. Heart size is accentuated by AP supine technique. Probable mild scarring in the right middle lobe and lingula. Lungs are otherwise clear. No pleural fluid. IMPRESSION: No acute findings. Electronically Signed   By: Lorin Picket M.D.   On: 06/20/2016 09:56   Dg Abd Portable 1v  Result Date: 06/24/2016 CLINICAL DATA:  Abdominal distention. EXAM: PORTABLE ABDOMEN - 1 VIEW COMPARISON:  06/23/2016 . FINDINGS: NG tube noted in stable position with tip in the stomach . Surgical clips right upper quadrant. Several nonspecific loops of air-filled small bowel again noted. Colonic gas pattern is normal. No free air. IMPRESSION: 1.  NG tube in stable position. 2. Several nonspecific loops of air-filled small bowel again noted. No evidence of progressive bowel distention. Colonic gas pattern normal. No free air. Electronically Signed   By: Marcello Moores  Register   On: 06/24/2016 08:06   Dg Abd Portable 1v  Result Date: 06/23/2016 CLINICAL DATA:  61 year old female with shortness of breath and abdominal pain. 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 Being treated conservatively at this time, including with bowel rest. EXAM: PORTABLE ABDOMEN - 1 VIEW COMPARISON:  Abdominal radiographs 06/22/2016.  CTA 06/20/2016, and earlier. FINDINGS: Portable AP supine view at 0759 hours. Stable NG tube, side hole to level of the gastric body and tip at the level of the gastric antrum. Stable cholecystectomy clips. Stable bowel gas pattern with several gas containing nondilated small and large bowel loops. No definite pneumoperitoneum on this supine view. Abdominal and pelvic visceral contours appear stable. No acute osseous abnormality identified. IMPRESSION: 1. Stable NG tube  position. 2. Stable, nonobstructed bowel-gas pattern. Electronically Signed   By: Genevie Ann M.D.   On: 06/23/2016 08:18   Dg Abd Portable 1v  Result Date: 06/22/2016 CLINICAL DATA:  Abdominal pain for few days with nausea and vomiting. EXAM: PORTABLE ABDOMEN - 1 VIEW COMPARISON:  06/22/2016 abdominal radiograph. FINDINGS: Enteric tube loops in the gastric fundus and terminates in the body of the stomach, without appreciable kink. Top-normal caliber small bowel loops throughout the abdomen. Minimal colonic stool. No evidence of pneumatosis or pneumoperitoneum. No radiopaque urolithiasis. Cholecystectomy clips are seen in the right upper quadrant of the abdomen. Patchy opacities at the lung bases. IMPRESSION: 1. Enteric tube loops in the gastric fundus and terminates in the body of the stomach without appreciable kink. 2. Top-normal caliber small bowel loops, unchanged. 3. Patchy bibasilar lung opacities, correlate with chest radiograph. Electronically Signed   By: Ilona Sorrel M.D.   On: 06/22/2016 16:58   Dg Abd Portable 1v  Result Date: 06/22/2016 CLINICAL DATA:  Vomiting.  Shortness of breath.  AAA . EXAM: PORTABLE ABDOMEN - 1 VIEW COMPARISON:  06/21/2016. FINDINGS: Surgical clips right upper quadrant. NG tube noted with tip over the distal stomach. No bowel distention. No acute bony abnormality identified. Mild basilar atelectasis. IMPRESSION: NG tube noted with its tip over the distal stomach. No bowel distention or acute intra-abdominal abnormality identified. Electronically Signed   By: Marcello Moores  Register   On: 06/22/2016 07:30   Dg Abd Portable 1v  Result Date: 06/21/2016 CLINICAL DATA:  Feeding tube placement EXAM: PORTABLE ABDOMEN - 1 VIEW COMPARISON:  06/21/2016 FINDINGS: NG tube coils in the fundus of the stomach with the tip in the distal stomach. Decompression of the stomach. IMPRESSION: NG tube tip in the distal stomach. Electronically Signed   By: Rolm Baptise M.D.   On: 06/21/2016 15:46    Dg Abd Portable 1v  Result Date: 06/21/2016 CLINICAL DATA:  Ileus  Vomiting that started this AM per patient EXAM: PORTABLE ABDOMEN - 1 VIEW COMPARISON:  06/20/2016 FINDINGS: Gaseous distention of the stomach. Paucity of small bowel and colonic gas. Cholecystectomy clips. Linear scarring/ atelectasis in the lung bases. IMPRESSION: 1. Gaseous distention of the stomach. Electronically Signed   By: Lucrezia Europe M.D.   On: 06/21/2016 11:54   Dg Abd Portable 1v  Result Date: 06/20/2016 CLINICAL DATA:  Aortic dissection EXAM: PORTABLE ABDOMEN - 1 VIEW COMPARISON:  CT abdomen/ pelvis dated 06/20/2016 FINDINGS: Nonobstructive bowel gas pattern. Visualized osseous structures are within normal limits. IMPRESSION: Unremarkable abdominal radiograph. Electronically Signed   By: Julian Hy M.D.   On: 06/20/2016 17:02   Ct Angio Chest/abd/pel For Dissection W And/or W/wo  Result Date: 06/20/2016 CLINICAL DATA:  Substernal chest pain radiating to back EXAM: CT ANGIOGRAPHY CHEST, ABDOMEN AND PELVIS TECHNIQUE: Multidetector CT imaging through the chest, abdomen and pelvis was performed using the standard protocol during bolus administration of intravenous contrast. Multiplanar reconstructed images and MIPs were obtained and reviewed to evaluate the vascular anatomy. CONTRAST:  100 cc Isovue 370 IV COMPARISON:  CT abdomen  and pelvis 10/27/2011 FINDINGS: CTA CHEST FINDINGS Cardiovascular: There is the type B aortic dissection beginning in the distal aortic arch just beyond the origin of the great vessels. The true lumen is compressed by the false lumen. No aneurysm. No pulmonary embolus. Heart is normal size. Mediastinum/Nodes: No mediastinal, hilar, or axillary adenopathy. Lungs/Pleura: Atelectasis or scarring in the lingula. Lungs otherwise clear. No effusions. Musculoskeletal: No acute bony abnormality. Review of the MIP images confirms the above findings. CTA ABDOMEN AND PELVIS FINDINGS VASCULAR Aorta: Dissection  continues throughout the abdominal aorta with the celiac artery and superior mesenteric artery arising from the true lumen anteriorly. The dissection may extend into the left renal artery where there is only a small amount of blood flow noted in the proximal and mid left renal artery. Right renal artery appears to arise from the false lumen and is patent. The the true lumen appears thrombosed below the origin of the superior mesenteric artery. Inferior mesenteric artery arises from the thrombosed true lumen and appears occluded proximally, reconstitutes several cm from the origin. Celiac: Patent. SMA: Patent. Renals: As above. IMA: As above. Inflow: To the dissection continues into both common iliac arteries with thrombosed true lumens. The dissection appears to terminate at approximately the level of the common iliac bifurcation bilaterally. Veins: Grossly patent and unremarkable. Review of the MIP images confirms the above findings. NON-VASCULAR Hepatobiliary: Mild diffuse fatty infiltration. Prior cholecystectomy. Pancreas: No focal abnormality or ductal dilatation. Spleen: No focal abnormality.  Normal size. Adrenals/Urinary Tract: Nodules in the left adrenal gland are low-density on the precontrast imaging compatible with small adenomas. There are areas of non perfusion noted in the mid and lower poles of the left kidney compatible with infarction, likely related to the involvement of the left renal artery by dissection. Urinary bladder unremarkable. Stomach/Bowel: Stomach, large and small bowel grossly unremarkable. Lymphatic: No adenopathy. Reproductive: Prior hysterectomy.  No adnexal masses. Other: No free fluid or free air. Musculoskeletal: No acute bony abnormality. Review of the MIP images confirms the above findings. 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 This involves the descending thoracic aorta, abdominal aorta and iliac  vessels. The true lumen is compressed by the larger false lumen, and is thrombosed below the SMA/renal artery origins. The dissection appears to extend into the left renal artery with partial occlusion and areas of infarct in the mid and lower pole of the left kidney. Fatty infiltration of the liver. Critical Value/emergent results were called by telephone at the time of interpretation on 06/20/2016 at 10:13 am to Dr. Fredia Sorrow , who verbally acknowledged these results. Electronically Signed   By: Rolm Baptise M.D.   On: 06/20/2016 10:15   Anti-infectives: Anti-infectives    Start     Dose/Rate Route Frequency Ordered Stop   06/22/16 1800  clindamycin (CLEOCIN) IVPB 600 mg     600 mg 100 mL/hr over 30 Minutes Intravenous Every 8 hours 06/22/16 1723     06/22/16 1000  azithromycin (ZITHROMAX) 250 mg in dextrose 5 % 125 mL IVPB     250 mg 125 mL/hr over 60 Minutes Intravenous Every 24 hours 06/22/16 0826     06/22/16 0900  levofloxacin (LEVAQUIN) IVPB 500 mg  Status:  Discontinued     500 mg 100 mL/hr over 60 Minutes Intravenous Every 24 hours 06/22/16 0759 06/22/16 0824      Assessment/Plan: s/p * No surgery found * Stable overall. Continue TNA  for bowel rest. No evidence of lower extremity ischemia   LOS: 5 days   Donte Kary 06/25/2016, 7:55 AM

## 2016-06-25 NOTE — Progress Notes (Signed)
RT note-CVP changed today due to no date or label.

## 2016-06-25 NOTE — Progress Notes (Signed)
Patient's SBP went up to 150s, scheduled Metoprolol and Hydralizine prn were given with no effect on the BP. Esmolol drip was started to keep SBP less than 130 and was titrated during the night to meet parameters. Continue to monitor the patient.

## 2016-06-26 ENCOUNTER — Inpatient Hospital Stay (HOSPITAL_COMMUNITY): Payer: BLUE CROSS/BLUE SHIELD

## 2016-06-26 ENCOUNTER — Encounter (HOSPITAL_COMMUNITY): Payer: Self-pay | Admitting: Radiology

## 2016-06-26 DIAGNOSIS — J9601 Acute respiratory failure with hypoxia: Secondary | ICD-10-CM

## 2016-06-26 LAB — COMPREHENSIVE METABOLIC PANEL
ALT: 174 U/L — ABNORMAL HIGH (ref 14–54)
ALT: 134 U/L — ABNORMAL HIGH (ref 14–54)
AST: 68 U/L — ABNORMAL HIGH (ref 15–41)
AST: 58 U/L — ABNORMAL HIGH (ref 15–41)
Albumin: 2.4 g/dL — ABNORMAL LOW (ref 3.5–5.0)
Albumin: 2.1 g/dL — ABNORMAL LOW (ref 3.5–5.0)
Alkaline Phosphatase: 100 U/L (ref 38–126)
Alkaline Phosphatase: 100 U/L (ref 38–126)
Anion gap: 8 (ref 5–15)
Anion gap: 8 (ref 5–15)
BUN: 48 mg/dL — ABNORMAL HIGH (ref 6–20)
BUN: 46 mg/dL — ABNORMAL HIGH (ref 6–20)
CO2: 21 mmol/L — ABNORMAL LOW (ref 22–32)
CO2: 20 mmol/L — ABNORMAL LOW (ref 22–32)
Calcium: 8.7 mg/dL — ABNORMAL LOW (ref 8.9–10.3)
Calcium: 8.7 mg/dL — ABNORMAL LOW (ref 8.9–10.3)
Chloride: 108 mmol/L (ref 101–111)
Chloride: 98 mmol/L — ABNORMAL LOW (ref 101–111)
Creatinine, Ser: 1.26 mg/dL — ABNORMAL HIGH (ref 0.44–1.00)
Creatinine, Ser: 1.28 mg/dL — ABNORMAL HIGH (ref 0.44–1.00)
GFR calc Af Amer: 53 mL/min — ABNORMAL LOW (ref >60)
GFR calc Af Amer: 52 mL/min — ABNORMAL LOW (ref >60)
GFR calc non Af Amer: 46 mL/min — ABNORMAL LOW (ref >60)
GFR calc non Af Amer: 45 mL/min — ABNORMAL LOW (ref >60)
Glucose, Bld: 203 mg/dL — ABNORMAL HIGH (ref 65–99)
Glucose, Bld: 906 mg/dL (ref 65–99)
Potassium: 4 mmol/L (ref 3.5–5.1)
Potassium: 5.4 mmol/L — ABNORMAL HIGH (ref 3.5–5.1)
Sodium: 137 mmol/L (ref 135–145)
Sodium: 126 mmol/L — ABNORMAL LOW (ref 135–145)
Total Bilirubin: 0.6 mg/dL (ref 0.3–1.2)
Total Bilirubin: 0.9 mg/dL (ref 0.3–1.2)
Total Protein: 5.4 g/dL — ABNORMAL LOW (ref 6.5–8.1)
Total Protein: 5.2 g/dL — ABNORMAL LOW (ref 6.5–8.1)

## 2016-06-26 LAB — POCT I-STAT 3, ART BLOOD GAS (G3+)
Acid-base deficit: 2 mmol/L (ref 0.0–2.0)
Bicarbonate: 21.5 mmol/L (ref 20.0–28.0)
O2 Saturation: 94 %
Patient temperature: 97.9
TCO2: 22 mmol/L (ref 0–100)
pCO2 arterial: 32.3 mmHg (ref 32.0–48.0)
pH, Arterial: 7.429 (ref 7.350–7.450)
pO2, Arterial: 66 mmHg — ABNORMAL LOW (ref 83.0–108.0)

## 2016-06-26 LAB — CBC
HCT: 35.1 % — ABNORMAL LOW (ref 36.0–46.0)
HCT: 36.6 % (ref 36.0–46.0)
Hemoglobin: 10 g/dL — ABNORMAL LOW (ref 12.0–15.0)
Hemoglobin: 11.8 g/dL — ABNORMAL LOW (ref 12.0–15.0)
MCH: 32.6 pg (ref 26.0–34.0)
MCH: 33.2 pg (ref 26.0–34.0)
MCHC: 28.5 g/dL — ABNORMAL LOW (ref 30.0–36.0)
MCHC: 32.2 g/dL (ref 30.0–36.0)
MCV: 122 fL — ABNORMAL HIGH (ref 78.0–100.0)
MCV: 103.1 fL — ABNORMAL HIGH (ref 78.0–100.0)
Platelets: 87 10*3/uL — ABNORMAL LOW (ref 150–400)
Platelets: 94 10*3/uL — ABNORMAL LOW (ref 150–400)
RBC: 3.07 MIL/uL — ABNORMAL LOW (ref 3.87–5.11)
RBC: 3.55 MIL/uL — ABNORMAL LOW (ref 3.87–5.11)
RDW: 15.8 % — ABNORMAL HIGH (ref 11.5–15.5)
RDW: 14 % (ref 11.5–15.5)
WBC: 24 10*3/uL — ABNORMAL HIGH (ref 4.0–10.5)
WBC: 25.4 10*3/uL — ABNORMAL HIGH (ref 4.0–10.5)

## 2016-06-26 LAB — LACTIC ACID, PLASMA: Lactic Acid, Venous: 1.5 mmol/L (ref 0.5–1.9)

## 2016-06-26 LAB — GLUCOSE, CAPILLARY
Glucose-Capillary: 189 mg/dL — ABNORMAL HIGH (ref 65–99)
Glucose-Capillary: 194 mg/dL — ABNORMAL HIGH (ref 65–99)
Glucose-Capillary: 209 mg/dL — ABNORMAL HIGH (ref 65–99)
Glucose-Capillary: 190 mg/dL — ABNORMAL HIGH (ref 65–99)
Glucose-Capillary: 159 mg/dL — ABNORMAL HIGH (ref 65–99)

## 2016-06-26 LAB — PHOSPHORUS: Phosphorus: 4.3 mg/dL (ref 2.5–4.6)

## 2016-06-26 LAB — COOXEMETRY PANEL
Carboxyhemoglobin: 1.3 % (ref 0.5–1.5)
Methemoglobin: 0.9 % (ref 0.0–1.5)
O2 Saturation: 86.6 %
Total hemoglobin: 11.9 g/dL — ABNORMAL LOW (ref 12.0–16.0)

## 2016-06-26 LAB — HEPARIN INDUCED PLATELET AB (HIT ANTIBODY): Heparin Induced Plt Ab: 0.236 OD (ref 0.000–0.400)

## 2016-06-26 LAB — MAGNESIUM: Magnesium: 1.8 mg/dL (ref 1.7–2.4)

## 2016-06-26 MED ORDER — FUROSEMIDE 10 MG/ML IJ SOLN
40.0000 mg | Freq: Every day | INTRAMUSCULAR | Status: DC
  Administered 2016-06-26 – 2016-06-30 (×5): 40 mg via INTRAVENOUS
  Filled 2016-06-26 (×7): qty 4

## 2016-06-26 MED ORDER — M.V.I. ADULT IV INJ
INTRAVENOUS | Status: DC
  Filled 2016-06-26: qty 1992

## 2016-06-26 MED ORDER — POTASSIUM CHLORIDE 10 MEQ/50ML IV SOLN
10.0000 meq | INTRAVENOUS | Status: AC
  Administered 2016-06-26 (×2): 10 meq via INTRAVENOUS
  Filled 2016-06-26 (×2): qty 50

## 2016-06-26 MED ORDER — TRACE MINERALS CR-CU-MN-SE-ZN 10-1000-500-60 MCG/ML IV SOLN: INTRAVENOUS | Status: DC

## 2016-06-26 MED ORDER — INSULIN GLARGINE 100 UNIT/ML ~~LOC~~ SOLN
30.0000 [IU] | Freq: Two times a day (BID) | SUBCUTANEOUS | Status: DC
  Administered 2016-06-26 – 2016-06-28 (×5): 30 [IU] via SUBCUTANEOUS
  Filled 2016-06-26 (×6): qty 0.3

## 2016-06-26 MED ORDER — M.V.I. ADULT IV INJ
INTRAVENOUS | Status: AC
  Administered 2016-06-26: 17:00:00 via INTRAVENOUS
  Filled 2016-06-26: qty 1200

## 2016-06-26 MED ORDER — IOPAMIDOL (ISOVUE-300) INJECTION 61%
INTRAVENOUS | Status: AC
  Administered 2016-06-26: 30 mL
  Filled 2016-06-26: qty 30

## 2016-06-26 MED ORDER — SODIUM CHLORIDE 0.9 % IV SOLN
1.0000 g | Freq: Three times a day (TID) | INTRAVENOUS | Status: DC
  Administered 2016-06-26 – 2016-07-06 (×30): 1 g via INTRAVENOUS
  Filled 2016-06-26 (×31): qty 1

## 2016-06-26 MED ORDER — ALBUTEROL SULFATE (2.5 MG/3ML) 0.083% IN NEBU
2.5000 mg | INHALATION_SOLUTION | Freq: Three times a day (TID) | RESPIRATORY_TRACT | Status: DC
  Administered 2016-06-26 – 2016-06-29 (×10): 2.5 mg via RESPIRATORY_TRACT
  Filled 2016-06-26 (×10): qty 3

## 2016-06-26 MED ORDER — INSULIN GLARGINE 100 UNIT/ML ~~LOC~~ SOLN
35.0000 [IU] | Freq: Two times a day (BID) | SUBCUTANEOUS | Status: DC
  Administered 2016-06-26: 35 [IU] via SUBCUTANEOUS
  Filled 2016-06-26 (×3): qty 0.35

## 2016-06-26 MED ORDER — METOPROLOL TARTRATE 5 MG/5ML IV SOLN
10.0000 mg | INTRAVENOUS | Status: AC
  Administered 2016-06-26: 5 mg via INTRAVENOUS
  Administered 2016-06-26 – 2016-07-02 (×34): 10 mg via INTRAVENOUS
  Filled 2016-06-26 (×35): qty 10

## 2016-06-26 MED ORDER — HYDRALAZINE HCL 20 MG/ML IJ SOLN
10.0000 mg | INTRAMUSCULAR | Status: DC | PRN
  Administered 2016-06-27 – 2016-06-30 (×2): 10 mg via INTRAVENOUS
  Filled 2016-06-26 (×2): qty 1

## 2016-06-26 MED ORDER — MAGNESIUM SULFATE IN D5W 1-5 GM/100ML-% IV SOLN
1.0000 g | Freq: Once | INTRAVENOUS | Status: AC
  Administered 2016-06-26: 1 g via INTRAVENOUS
  Filled 2016-06-26: qty 100

## 2016-06-26 MED ORDER — METOPROLOL TARTRATE 5 MG/5ML IV SOLN
7.5000 mg | INTRAVENOUS | Status: DC
  Administered 2016-06-26: 7.5 mg via INTRAVENOUS
  Filled 2016-06-26: qty 10

## 2016-06-26 NOTE — Progress Notes (Signed)
CC: abdominal pain  Subjective: She is a hard exam.  Initially she does not seem as tender,but the more you palpate the more uncomfortable she appears. She has NO BS.  The NG is putting out green NG content.    Objective: Vital signs in last 24 hours: Temp:  [97.6 F (36.4 C)-98.2 F (36.8 C)] 97.6 F (36.4 C) (05/25 0740) Pulse Rate:  [70-84] 78 (05/25 0700) Resp:  [14-25] 21 (05/25 0700) BP: (90-157)/(46-99) 129/55 (05/25 0700) SpO2:  [89 %-100 %] 97 % (05/25 0700) Weight:  [87.8 kg (193 lb 9 oz)] 87.8 kg (193 lb 9 oz) (05/25 0500) Last BM Date: 06/20/16 4117 IV 1295 NG 1300 WT 87.8 KG - up 3 Kg over the last 48 hours Afebrile, VSS Labs OK WBC is up to 25.4 H/H is stable CXR bilateral interstitial fluid/ ABX Ok Intake/Output from previous day: 05/24 0701 - 05/25 0700 In: 4116.7 [I.V.:3366.7; IV Piggyback:750] Out: 2595 [Urine:1295; Emesis/NG output:1300] Intake/Output this shift: No intake/output data recorded.  General appearance: alert, cooperative and no distress Resp: clear to auscultation bilaterally and anterior, no wheezing noted. GI: distended, no BS, no flatus, no BM for 6 days, still somewhat tender on exam  Lab Results:   Recent Labs  06/26/16 0420 06/26/16 0535  WBC 24.0* 25.4*  HGB 10.0* 11.8*  HCT 35.1* 36.6  PLT 87* 94*    BMET  Recent Labs  06/25/16 0643 06/26/16 0535  NA 137 137  K 3.8 4.0  CL 107 108  CO2 23 21*  GLUCOSE 193* 203*  BUN 41* 48*  CREATININE 1.17* 1.26*  CALCIUM 8.6* 8.7*   PT/INR No results for input(s): LABPROT, INR in the last 72 hours.   Recent Labs Lab 06/22/16 0351 06/23/16 0411 06/24/16 0313 06/25/16 0643 06/26/16 0535  AST 486* 318* 192* 116* 68*  ALT 535* 526* 353* 238* 174*  ALKPHOS 93 137* 112 89 100  BILITOT 0.9 1.6* 1.1 0.8 0.6  PROT 5.6* 5.4* 5.6* 5.4* 5.4*  ALBUMIN 3.0* 2.8* 2.5* 2.6* 2.4*     Lipase     Component Value Date/Time   LIPASE 38 06/20/2016 0815      Medications: . albuterol  2.5 mg Inhalation TID  . bisacodyl  10 mg Rectal q morning - 10a  . chlorhexidine  15 mL Mouth Rinse BID  . Chlorhexidine Gluconate Cloth  6 each Topical Daily  . citalopram  20 mg Oral Daily  . cloNIDine  0.2 mg Transdermal Weekly  . fentaNYL  50 mcg Transdermal Q72H  . furosemide  20 mg Intravenous BID  . insulin aspart  0-24 Units Subcutaneous Q4H  . insulin glargine  30 Units Subcutaneous BID  . mouth rinse  15 mL Mouth Rinse q12n4p  . metoprolol tartrate  5 mg Intravenous Q4H  . metoprolol tartrate  50 mg Oral BID  . mometasone-formoterol  2 puff Inhalation BID  . nicotine  14 mg Transdermal Daily  . nitroGLYCERIN  0.4 mg Transdermal Daily  . pantoprazole (PROTONIX) IV  40 mg Intravenous Q24H  . sodium chloride flush  10-40 mL Intracatheter Q12H   . Marland KitchenTPN (CLINIMIX-E) Adult 83 mL/hr at 06/25/16 1704  . sodium chloride    . sodium chloride 35 mL/hr at 06/26/16 0337  . azithromycin Stopped (06/25/16 1129)  . clindamycin (CLEOCIN) IV Stopped (06/26/16 0539)  . esmolol 175 mcg/kg/min (06/26/16 0545)   Anti-infectives    Start     Dose/Rate Route Frequency Ordered Stop  06/26/16 0830  meropenem (MERREM) 1 g in sodium chloride 0.9 % 100 mL IVPB     1 g 200 mL/hr over 30 Minutes Intravenous Every 8 hours 06/26/16 0810     06/22/16 1800  clindamycin (CLEOCIN) IVPB 600 mg  Status:  Discontinued     600 mg 100 mL/hr over 30 Minutes Intravenous Every 8 hours 06/22/16 1723 06/26/16 0810   06/22/16 1000  azithromycin (ZITHROMAX) 250 mg in dextrose 5 % 125 mL IVPB     250 mg 125 mL/hr over 60 Minutes Intravenous Every 24 hours 06/22/16 0826     06/22/16 0900  levofloxacin (LEVAQUIN) IVPB 500 mg  Status:  Discontinued     500 mg 100 mL/hr over 60 Minutes Intravenous Every 24 hours 06/22/16 0759 06/22/16 0824      Assessment/Plan 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 ischemia  Gastroparesis  Increasing leukocytosis Wt up 3 Kg last 2 days Tobacco use GERD IBS Hypertension Type II diabetes FEN:  TNA/NPO/NG - clamping trial ID:  Azithromycin/Clindamycin 5/21 =>> day 5 DVT:  SCD's  Plan:  Discussed with [facility], he is going to get a CT without contrast, try some oral contrast and see what she can tolerate.        LOS: 6 days    Carri Spillers 06/26/2016 [phone]

## 2016-06-26 NOTE — Progress Notes (Signed)
Pt moved to [place], family updated, husband at [facility], daughter called and also updated, pt last SBP 123, report given to receiving RN, nursing will cont to monitor

## 2016-06-26 NOTE — Progress Notes (Signed)
Patient instructed and taught back how to use the flutter device.

## 2016-06-26 NOTE — Evaluation (Signed)
Physical Therapy Evaluation Patient Details Name: Haley Graham MRN: [id number] DOB: [birth year] Today's Date: 06/26/2016   History of Present Illness  Pt adm with type B aortic dissection. Treated non surgically with BP control and pain control. Pt developed pancreatitis and ileus with NG tube placed. PMH - anxiety, arthritis  Clinical Impression  Patient presents with decreased independence with mobility due to deficits listed in PT problem list.  Previously independent and now needs mod support for short distance ambulation with walker.  Supportive spouse so feel could return home with follow up HHPT and aide and spouse assist.  Will follow acutely.     Follow Up Recommendations Home health PT;Supervision/Assistance - 24 hour ([facility] aide)    Equipment Recommendations  3in1 (PT);Rolling walker with 5" wheels    Recommendations for Other Services       Precautions / Restrictions Precautions Precautions: Fall Precaution Comments: NG to suction      Mobility  Bed Mobility Overal bed mobility: Needs Assistance Bed Mobility: Sit to Supine       Sit to supine: Mod assist   General bed mobility comments: for legs into bed  Transfers Overall transfer level: Needs assistance Equipment used: Rolling walker (2 wheeled) Transfers: Sit to/from Stand Sit to Stand: Mod assist         General transfer comment: lifting help from recliner, cues for hand placement and increased time  Ambulation/Gait Ambulation/Gait assistance: Min assist;+2 safety/equipment Ambulation Distance (Feet): 30 Feet (&15') Assistive device: Rolling walker (2 wheeled) Gait Pattern/deviations: Step-through pattern;Decreased stride length;Wide base of support     General Gait Details: slow pace, increased WOB with ambulation on 6L O2 HR stable.  spouse followed with chair and pt with seated rest prior to returning to bed  Stairs            Wheelchair Mobility    Modified Rankin (Stroke Patients  Only)       Balance Overall balance assessment: Needs assistance   Sitting balance-Leahy Scale: Good     Standing balance support: Bilateral upper extremity supported Standing balance-Leahy Scale: Poor Standing balance comment: due to weakness                             Pertinent Vitals/Pain Pain Assessment: Faces Faces Pain Scale: Hurts little more Pain Location: back and abdomen Pain Descriptors / Indicators: Sore Pain Intervention(s): Monitored during session;Repositioned    Home Living Family/patient expects to be discharged to:: Private residence Living Arrangements: Spouse/significant other Available Help at Discharge: Family Type of Home: Mobile home Home Access: Stairs to enter Entrance Stairs-Rails: Right Entrance Stairs-Number of Steps: 6 Home Layout: One level Home Equipment: None      Prior Function Level of Independence: Independent               Hand Dominance        Extremity/Trunk Assessment   Upper Extremity Assessment Upper Extremity Assessment: Generalized weakness    Lower Extremity Assessment Lower Extremity Assessment: Generalized weakness       Communication   Communication: No difficulties  Cognition Arousal/Alertness: Awake/alert Behavior During Therapy: WFL for tasks assessed/performed Overall Cognitive Status: Within Functional Limits for tasks assessed                                        General Comments General comments (skin integrity,  edema, etc.): red in face and neck    Exercises     Assessment/Plan    PT Assessment Patient needs continued PT services  PT Problem List Decreased strength;Decreased mobility;Decreased activity tolerance;Decreased balance;Decreased knowledge of use of DME;Pain       PT Treatment Interventions DME instruction;Gait training;Therapeutic activities;Therapeutic exercise;Patient/family education;Stair training;Balance training    PT Goals (Current  goals can be found in the Care Plan section)  Acute Rehab PT Goals Patient Stated Goal: To get stronger PT Goal Formulation: With patient/family Time For Goal Achievement: 07/10/16 Potential to Achieve Goals: Good    Frequency Min 3X/week   Barriers to discharge        Co-evaluation               AM-PAC PT "6 Clicks" Daily Activity  Outcome Measure Difficulty turning over in bed (including adjusting bedclothes, sheets and blankets)?: A Little Difficulty moving from lying on back to sitting on the side of the bed? : Total Difficulty sitting down on and standing up from a chair with arms (e.g., wheelchair, bedside commode, etc,.)?: Total Help needed moving to and from a bed to chair (including a wheelchair)?: A Lot Help needed walking in hospital room?: A Little Help needed climbing 3-5 steps with a railing? : A Lot 6 Click Score: 12    End of Session Equipment Utilized During Treatment: Gait belt;Oxygen Activity Tolerance: Patient limited by fatigue Patient left: in bed;with call bell/phone within reach;with family/visitor present   PT Visit Diagnosis: Muscle weakness (generalized) (M62.81);Other abnormalities of gait and mobility (R26.89)    Time: 1100-1130 PT Time Calculation (min) (ACUTE ONLY): 30 min   Charges:   PT Evaluation $PT Eval Moderate Complexity: 1 Procedure PT Treatments $Gait Training: 8-22 mins   PT G CodesMagda Kiel, [place] [phone] 06/26/2016   Reginia Naas 06/26/2016, 12:02 PM

## 2016-06-26 NOTE — Progress Notes (Signed)
  Subjective: Remains with sig NG output and no BM WBC elevated- recultured and antibiotic coverage increased - meropenum Bicarb remains normal CT chest- atelectasis at bases with new chanegs c/w ARDS- will call CCM consult ABD CT - ileus, no clear sign of bowel ischemia TNA infusing  Objective: Vital signs in last 24 hours: Temp:  [97.6 F (36.4 C)-98 F (36.7 C)] 97.9 F (36.6 C) (05/25 1125) Pulse Rate:  [72-83] 79 (05/25 1400) Cardiac Rhythm: Normal sinus rhythm (05/25 1358) Resp:  [13-31] 28 (05/25 1400) BP: (90-160)/(46-99) 119/59 (05/25 1400) SpO2:  [89 %-99 %] 93 % (05/25 1400) Weight:  [193 lb 9 oz (87.8 kg)] 193 lb 9 oz (87.8 kg) (05/25 0500)  Hemodynamic parameters for last 24 hours:  stable pulse, BP  Intake/Output from previous day: 05/24 0701 - 05/25 0700 In: 4116.7 [I.V.:3366.7; IV Piggyback:750] Out: 2595 [Urine:1295; Emesis/NG output:1300] Intake/Output this shift: Total I/O In: 1937.8 [P.O.:740; I.V.:1072.8; Other:125] Out: 1725 [VWUJW:1191; Emesis/NG output:550]  Alert abd with min tenderness Scattered rhonchi extrem warm, good pedal pulses Lab Results:  Recent Labs  06/26/16 0420 06/26/16 0535  WBC 24.0* 25.4*  HGB 10.0* 11.8*  HCT 35.1* 36.6  PLT 87* 94*   BMET:   Recent Labs  06/25/16 0643 06/26/16 0535  NA 137 137  K 3.8 4.0  CL 107 108  CO2 23 21*  GLUCOSE 193* 203*  BUN 41* 48*  CREATININE 1.17* 1.26*  CALCIUM 8.6* 8.7*    PT/INR: No results for input(s): LABPROT, INR in the last 72 hours. ABG    Component Value Date/Time   PHART 7.430 06/24/2016 0332   HCO3 21.6 06/24/2016 0332   TCO2 23 06/24/2016 1721   ACIDBASEDEF 2.0 06/24/2016 0332   O2SAT 93.5 06/24/2016 0332   CBG (last 3)   Recent Labs  06/26/16 0332 06/26/16 0738 06/26/16 1124  GLUCAP 189* 194* 209*    Assessment/Plan: S/P  Cont current care F/u blood cultures CCM consult for developing ARDS and multi-system dysfunction Reduce IV fluids  LOS:  6 days    [name] 06/26/2016

## 2016-06-26 NOTE — Progress Notes (Addendum)
PHARMACY - ADULT TOTAL PARENTERAL NUTRITION CONSULT NOTE   Pharmacy Consult for TPN Indication: prolonged NPO status/high NG output   Patient Measurements: Height: '5\' 3"'  (160 cm) Weight: 193 lb 9 oz (87.8 kg) IBW/kg (Calculated) : 52.4 TPN AdjBW (KG): 60.3 Body mass index is 34.29 kg/m. 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 Patient had a significant hx of N/V for 2-4 weeks prior to admission. Weight appears to have been maintained. Patient had a NG tube placed 5/20 which has has significant output with 1-1.5 L a day.   GI: NG tube in place for drainage (1.3 L out 5/24)- green content. Lower abdominal pain improved. Clamping trial 5/24. Prealbumin-14.8 and albumin down to 2.4. Phenergan Q 6 prn. Last BM: 5/19- Bisacodyl every morning Endo: Hx of DM. No insulin at home. CBGs 141-203 on 60 units of lantus and SSI. Glucose trending up since  TPN initiation.  Insulin requirements in the past 24 hours: 26 units of aspart; 54 units of lantus  Lytes: Na-137, K-4.0 (got 4 runs of potassium 5/24), CoCa-9.8, Mg- 1.8 Phos-4.3 Renal:AKI: Scr up to 1.26 (Baseline < 1). BUN-48. NS @ 35 ml/hr  Pulm: 4 L Harvey ; Dulera Cards: Stanford type B aortic dissection; BP WNL-high, HR 70s in NSR. Esmolol gtt , lopressor increased to 7.5 mg Q 4 , clonidine 0.2 mg patch  Lasix added 5/24. CTA planned for today.  Hepatobil:AST/ALT trending down 68/174. Alk phos, Tbili WNL. Triglycerides elevated 506 Neuro:Citalopram; Hydromorphone prn, Fentanyl patch  ID: D# 5 Azith for possible RLL pneumonia. Clindamycin stopped. Meropenem added 5/25.  WBC up to 25.4. Afebrile. No growth on cultures. Repeating today.  Best Practices:PPI TPN Access:PICC line placed 5/20 TPN start date:5/22  Nutritional Goals (per RD recommendation on 5/22): KCal: 2000-2200 kcal/day  Protein: 110-120 gm/day Goal TPN rate: Clinimix 5/15 E @ 83 ml/hr + lipid  emulsion 20 ml/hr (Provides 1900 kcal and 100 gm of protein)  Current Nutrition:  TPN  Plan:  Continue Clinimix E 5/15 at  83 ml/hr Hold lipids due to elevated triglycerides (Resume by 6/6) Continue IVMF NS at 35 ml/hr per MD  This provides 100 g of protein and 1415 kCals per day meeting 91 % of protein and 71 % of kCal needs Daily MVI in TPN Trace Elements every other day - Next due 5/26 Increase Lantus to 35 units BID   Continue SSI and adjust as needed  Monitor CMET, Phos , Mg  Give 1 Gm magnesium sulfate Give potassium chloride X 2 runs (20 mEq)   Ihor Austin, PharmD PGY1 Pharmacy Resident Pager: [phone] 06/26/2016 8:05 AM   Dr. Nils Pyle reduced TPN rate to 50 ml/hr due to patient having ARDS symptoms.  Decrease Lantus to 30 units BID due to decreased TPN rate.   Ihor Austin, PharmD 06/26/2016 2:35 PM

## 2016-06-26 NOTE — Progress Notes (Addendum)
Nutrition Follow Up  DOCUMENTATION CODES:   Obesity unspecified  INTERVENTION:    Consider placing CORTRAK tube (tip post-pyloric) and starting Vital AF 1.2 formula at 10 ml/hr    TPN per pharmacy  NUTRITION DIAGNOSIS:   Inadequate oral intake related to altered GI function as evidenced by NPO status, ongoing  GOAL:   Patient will meet greater than or equal to 90% of their needs, met  MONITOR:   Diet advancement, PO intake, Labs, Weight trends, Skin, I & O's  REASON FOR ASSESSMENT:   Consult New TPN/TNA  ASSESSMENT:   Pt presented to [facility] with acute onset of chest pain radiating to the back and abdominal pain radiating to the back. Patient's had difficulty with abdominal pain on and off for several weeks. Recently had an ultrasound of her abdomen and colonoscopy without any acute findings. Symptoms became very severe 5/19 AM.  CTA demonstrated acute aortic dissection originating at the proximal descending thoracic aorta. Pt admitted for pain control, blood pressure control, bed rest and IV hydration.  Started on TPN 5/22. Medications reviewed.  Labs reviewed.  Triglycerides 506 (H). NGT to LIS.  Output 1.3 L x 24 hrs per flowsheets. CBG's [phone].  Current TPN prescription: Clinimix E 5/15 @ 50 ml/hr.  Lipids are currently being held due to high triglycerides.  Provides 852 kcal and 60 gm protein per day.  Meets 43% minimum estimated energy needs and 54% minimum estimated protein needs.  Nutrition focused physical exam completed.  No muscle or subcutaneous fat depletion noticed.  Diet Order:  Diet NPO time specified Except for: Sips with Meds, Ice Chips .TPN (CLINIMIX-E) Adult  Skin:  Reviewed, no issues  Last BM:  5/19  Height:   Ht Readings from Last 1 Encounters:  06/20/16 _0  (1.6 m)   Weight:   Wt Readings from Last 1 Encounters:  06/26/16 193 lb 9 oz (87.8 kg)   Ideal Body Weight:  52.2 kg  BMI:  Body mass index is 34.29 kg/m.  Estimated  Nutritional Needs:   Kcal:  2000-2200  Protein:  110-120 gm  Fluid:  per MD  EDUCATION NEEDS:   No education needs identified at this time  Arthur Holms, RD, LDN Pager #: [phone] After-Hours Pager #: [phone]

## 2016-06-26 NOTE — Consult Note (Signed)
PULMONARY / CRITICAL CARE MEDICINE   Name: Haley Graham MRN: [id number] DOB: [birth year]    ADMISSION DATE:  06/20/2016 CONSULTATION DATE:  06/26/16  REFERRING MD:  Dr. Nils Pyle   CHIEF COMPLAINT:  Bilateral Infiltrates, Hypoxic Respiratory Failure   HISTORY OF PRESENT ILLNESS:   61 y/O F admitted on 5/19 with sudden onset sharp, substernal chest pain that radiated into her back. She also complained of dizziness, nausea and shortness of breath.  She was hypertensive on initial evaluation.    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 The true lumen is compressed by the larger false lumen, and is thrombosed below the SMA/renal artery origins. The dissection appears to extend into the left renal artery with partial occlusion and areas of infarct in the mid and lower pole of the left kidney.  She was admitted for blood pressure control by CVTS.  The patient was felt not to be a surgical candidate.  Course complicated by ileus.  She was evaluated by [facility]CS and recommended conservative medical management.  She was placed on TNA.  Over the course of admission, she developed progressive hypoxia requiring 6L O2.  Repeat CT of the chest on 5/25 showed diffuse patchy airspace disease, mutli-segment atelectasis at the bases & small pleural effusions, ? Infiltrate LLL.    PCCM consulted for evaluation of hypoxic respiratory failure.     PAST MEDICAL HISTORY :  She  has a past medical history of Anxiety; Arthritis; Diabetes mellitus without complication ([place]); Gastroparesis; GERD (gastroesophageal reflux disease); Hypertension; and IBS (irritable bowel syndrome).  PAST SURGICAL HISTORY: She  has a past surgical history that includes Abdominal hysterectomy; Colonoscopy; Cholecystectomy; Upper  gastrointestinal endoscopy; Cesarean section; Colonoscopy (N/A, 06/10/2016); and polypectomy (06/10/2016).  Allergies  Allergen Reactions  . Codeine Nausea And Vomiting  . Reglan [Metoclopramide] Other (See Comments)    Heart races  . Penicillins Rash    Has patient had a PCN reaction causing immediate rash, facial/tongue/throat swelling, SOB or lightheadedness with hypotension: yes  Has patient had a PCN reaction causing severe rash involving mucus membranes or skin necrosis: no Has patient had a PCN reaction that required hospitalization no Has patient had a PCN reaction occurring within the last 10 years:no If all of the above answers are "NO", then may proceed with Cephalosporin use.    No current facility-administered medications on file prior to encounter.    Current Outpatient Prescriptions on File Prior to Encounter  Medication Sig  . albuterol (VENTOLIN HFA) 108 (90 Base) MCG/ACT inhaler Inhale 2 puffs into the lungs every 6 (six) hours as needed for wheezing or shortness of breath.   . ALPRAZolam (XANAX) 0.5 MG tablet Take 0.25-0.5 mg by mouth 2 (two) times daily.   . citalopram (CELEXA) 40 MG tablet Take 40 mg by mouth at bedtime.   . dicyclomine (BENTYL) 10 MG capsule Take 1 capsule (10 mg total) by mouth 2 (two) times daily before a meal. (Patient taking differently: Take 10 mg by mouth 2 (two) times daily as needed (IBS symptoms). )  . fluticasone (FLONASE) 50 MCG/ACT nasal spray Place 2 sprays into both nostrils daily as needed for allergies or rhinitis.   Marland Kitchen ondansetron (ZOFRAN) 4 MG tablet Take 4 mg by mouth 3 (three) times daily as needed for nausea.  Marland Kitchen  pantoprazole (PROTONIX) 40 MG tablet Take 40 mg by mouth daily as needed (acid reflux/ indigestion).   . rosuvastatin (CRESTOR) 10 MG tablet Take 10 mg by mouth at bedtime.     FAMILY HISTORY:  Her indicated that her mother is alive. She indicated that her father is alive. She indicated that her brother is deceased. She  indicated that both of her others are alive.    SOCIAL HISTORY: She  reports that she has been smoking Cigarettes.  She has a 17.25 pack-year smoking history. She has never used smokeless tobacco. She reports that she does not drink alcohol or use drugs.  REVIEW OF SYSTEMS:  POSITIVES IN BOLD Gen: Denies fever, chills, weight change, fatigue, night sweats HEENT: Denies blurred vision, double vision, hearing loss, tinnitus, sinus congestion, rhinorrhea, sore throat, neck stiffness, dysphagia PULM: Denies shortness of breath, cough with clear sputum production, hemoptysis, wheezing CV: Denies chest pain, edema, orthopnea, paroxysmal nocturnal dyspnea, palpitations GI: Denies abdominal pain, nausea, vomiting, diarrhea, hematochezia, melena, constipation, change in bowel habits GU: Denies dysuria, hematuria, polyuria, oliguria, urethral discharge Endocrine: Denies hot or cold intolerance, polyuria, polyphagia or appetite change Derm: Denies rash, dry skin, scaling or peeling skin change.  Discoloring / blue tint of LE's/toes > husband reports improvement  Heme: Denies easy bruising, bleeding, bleeding gums Neuro: Denies headache, numbness, weakness, slurred speech, loss of memory or consciousness   SUBJECTIVE:   VITAL SIGNS: BP 138/61   Pulse 82   Temp 97.9 F (36.6 C) (Axillary)   Resp (!) 26   Ht 5\' 3"  (1.6 m)   Wt 193 lb 9 oz (87.8 kg)   SpO2 93%   BMI 34.29 kg/m   HEMODYNAMICS:    VENTILATOR SETTINGS:    INTAKE / OUTPUT: I/O last 3 completed shifts: In: 5705.7 [I.V.:4955.7; IV Piggyback:750] Out: 6378 [Urine:2060; Emesis/NG output:1350]  PHYSICAL EXAMINATION: General: chronically ill appearing female HEENT: MM pink/moist, pink flush to face Neuro: Awake, alert, MAE, generalized weakness  CV: s1s2 rrr, no m/r/g PULM: even/non-labored, lungs bilaterally clear anterior, diminished L>R lower  HY:IFOY, non-tender, mild distention, bsx4 hypo active, NGT in  place Extremities: warm/dry, no edema  Skin: no rashes or lesions, dusky LE's / toes (reportedly improved)   LABS:  BMET  Recent Labs Lab 06/24/16 0313  06/24/16 1721 06/25/16 0643 06/26/16 0535  NA 136  < > 139 137 137  K 3.6  < > 3.6 3.8 4.0  CL 105  < > 105 107 108  CO2 23  --   --  23 21*  BUN 38*  < > 38* 41* 48*  CREATININE 1.38*  < > 1.40* 1.17* 1.26*  GLUCOSE 163*  < > 179* 193* 203*  < > = values in this interval not displayed.  Electrolytes  Recent Labs Lab 06/24/16 0313 06/25/16 0643 06/26/16 0535  CALCIUM 8.4* 8.6* 8.7*  MG 2.3 2.2 1.8  PHOS 2.6 3.1 4.3    CBC  Recent Labs Lab 06/25/16 0643 06/26/16 0420 06/26/16 0535  WBC 15.7* 24.0* 25.4*  HGB 11.1* 10.0* 11.8*  HCT 34.1* 35.1* 36.6  PLT 87* 87* 94*    Coag's No results for input(s): APTT, INR in the last 168 hours.  Sepsis Markers  Recent Labs Lab 06/22/16 0845 06/23/16 0411 06/24/16 0315  LATICACIDVEN 4.4* 2.9* 1.7    ABG  Recent Labs Lab 06/22/16 1510 06/23/16 0338 06/24/16 0332  PHART 7.470* 7.502* 7.430  PCO2ART 30.5* 31.1* 33.1  PO2ART 78.0* 103.0 68.9*  Liver Enzymes  Recent Labs Lab 06/24/16 0313 06/25/16 0643 06/26/16 0535  AST 192* 116* 68*  ALT 353* 238* 174*  ALKPHOS 112 89 100  BILITOT 1.1 0.8 0.6  ALBUMIN 2.5* 2.6* 2.4*    Cardiac Enzymes  Recent Labs Lab 06/21/16 0328  TROPONINI 0.04*    Glucose  Recent Labs Lab 06/25/16 1546 06/25/16 1954 06/25/16 2338 06/26/16 0332 06/26/16 0738 06/26/16 1124  GLUCAP 141* 177* 183* 189* 194* 209*    Imaging Ct Abdomen Pelvis Wo Contrast  Result Date: 06/26/2016 CLINICAL DATA:  Shortness of breath EXAM: CT CHEST, ABDOMEN AND PELVIS WITHOUT CONTRAST TECHNIQUE: Multidetector CT imaging of the chest, abdomen and pelvis was performed following the standard protocol without IV contrast. COMPARISON:  06/20/2016 chest CT FINDINGS: CT CHEST FINDINGS Cardiovascular: Normal heart size. No pericardial  effusion. 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 Mild haziness around the upper descending segment is likely stable. Stable maximal diameter of 33 mm at this level. No intramural hematoma. Intermittently seen displaced intimal calcification. Mediastinum/Nodes: Negative for adenopathy. Right upper extremity PICC with tip at the SVC level. Lungs/Pleura: Multi segment atelectasis, worse on the left. Small pleural effusions. There is patchy ground-glass airspace density in the bilateral lungs without gradient. Airways are clear and there is no septal thickening. Musculoskeletal: No acute or aggressive finding. CT ABDOMEN PELVIS FINDINGS Hepatobiliary: Hepatic steatosis.Cholecystectomy with normal common bile duct diameter. Pancreas: Unremarkable. Spleen: Unremarkable. 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 The larger measures up to 27 mm and is indeterminate by densitometry, but left adrenal mass has been noted since at least 2006 abdominal CT report, images not available. Left renal cyst. No hydronephrosis or urolithiasis. 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 Negative decompressed urinary bladder. Stomach/Bowel: There is diffuse small bowel distention and fluid filling with mildly hazy mesenteries. Similar features in the colon proximally and at the transverse segment. No pneumatosis or perforation is noted. Nasogastric tube tip is at the pylorus. Vascular/Lymphatic: There is intimal flap displacement from known dissection, morphology appearing similar to prior. No mass or adenopathy. Reproductive:Hysterectomy.  Unremarkable ovaries. Other: No ascites or pneumoperitoneum.  Anasarca. Musculoskeletal: No acute abnormalities. These results were called by telephone at the time of interpretation on 06/26/2016 at 2:05 pm to Dr. Ivin Poot , who verbally acknowledged these results.  IMPRESSION: 1. Diffuse airspace disease. Pattern can be seen with noncardiogenic edema (ARDS), inflammatory pneumonitis, or atypical infection. Multi segment atelectasis at the bases and small pleural effusions. 2. Diffuse small bowel and colonic distention with fluid levels as seen with ileus. Question underlying bowel ischemia given the patient's known aortic dissection with proximal IMA occlusion and SMA flow via the narrow true lumen. 3. Incidental findings noted above. Electronically Signed   By: Monte Fantasia M.D.   On: 06/26/2016 14:05   Ct Chest Wo Contrast  Result Date: 06/26/2016 CLINICAL DATA:  Shortness of breath EXAM: CT CHEST, ABDOMEN AND PELVIS WITHOUT CONTRAST TECHNIQUE: Multidetector CT imaging of the chest, abdomen and pelvis was performed following the standard protocol without IV contrast. COMPARISON:  06/20/2016 chest CT FINDINGS: CT CHEST FINDINGS Cardiovascular: Normal heart size. No pericardial effusion. 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 Mild haziness around the upper descending segment is likely stable. Stable maximal diameter of 33 mm at this level. No intramural hematoma. Intermittently seen displaced intimal calcification. Mediastinum/Nodes: Negative for adenopathy. Right  upper extremity PICC with tip at the SVC level. Lungs/Pleura: Multi segment atelectasis, worse on the left. Small pleural effusions. There is patchy ground-glass airspace density in the bilateral lungs without gradient. Airways are clear and there is no septal thickening. Musculoskeletal: No acute or aggressive finding. CT ABDOMEN PELVIS FINDINGS Hepatobiliary: Hepatic steatosis.Cholecystectomy with normal common bile duct diameter. Pancreas: Unremarkable. Spleen: Unremarkable. 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 The larger measures up to 27 mm and is indeterminate by densitometry, but left adrenal mass has  been noted since at least 2006 abdominal CT report, images not available. Left renal cyst. No hydronephrosis or urolithiasis. 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 Negative decompressed urinary bladder. Stomach/Bowel: There is diffuse small bowel distention and fluid filling with mildly hazy mesenteries. Similar features in the colon proximally and at the transverse segment. No pneumatosis or perforation is noted. Nasogastric tube tip is at the pylorus. Vascular/Lymphatic: There is intimal flap displacement from known dissection, morphology appearing similar to prior. No mass or adenopathy. Reproductive:Hysterectomy.  Unremarkable ovaries. Other: No ascites or pneumoperitoneum.  Anasarca. Musculoskeletal: No acute abnormalities. These results were called by telephone at the time of interpretation on 06/26/2016 at 2:05 pm to Dr. Ivin Poot , who verbally acknowledged these results. IMPRESSION: 1. Diffuse airspace disease. Pattern can be seen with noncardiogenic edema (ARDS), inflammatory pneumonitis, or atypical infection. Multi segment atelectasis at the bases and small pleural effusions. 2. Diffuse small bowel and colonic distention with fluid levels as seen with ileus. Question underlying bowel ischemia given the patient's known aortic dissection with proximal IMA occlusion and SMA flow via the narrow true lumen. 3. Incidental findings noted above. Electronically Signed   By: Monte Fantasia M.D.   On: 06/26/2016 14:05   Dg Chest Port 1 View  Result Date: 06/26/2016 CLINICAL DATA:  Shortness of breath. EXAM: PORTABLE CHEST 1 VIEW COMPARISON:  Radiograph of July 17, 2016. FINDINGS: Stable cardiomegaly. Increased bilateral diffuse interstitial and basilar opacities are noted concerning for worsening edema. No pneumothorax is noted. Mild left pleural effusion is noted with associated atelectasis or infiltrate. Right-sided PICC line is unchanged in position. Nasogastric tube is  unchanged in position. Bony thorax is unremarkable. IMPRESSION: Increased bilateral diffuse interstitial densities consistent with edema. Mild left pleural effusion is noted with associated atelectasis or infiltrate. Electronically Signed   By: Marijo Conception, M.D.   On: 06/26/2016 07:40   Dg Abd Portable 1v  Result Date: 06/26/2016 CLINICAL DATA:  Shortness of breath, abdominal pain EXAM: PORTABLE ABDOMEN - 1 VIEW COMPARISON:  Jul 17, 2016 FINDINGS: NG tube tip is in the distal stomach. Prior cholecystectomy. Nonobstructive bowel gas pattern. No free air organomegaly. IMPRESSION: NG tube tip in the distal stomach.  No acute findings. Electronically Signed   By: Rolm Baptise M.D.   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  This involves the descending thoracic aorta, abdominal aorta and iliac vessels. The true lumen is compressed by the larger false lumen, and is thrombosed below the SMA/renal artery origins. The dissection appears to extend into the left renal artery with partial occlusion and areas of infarct in the mid and lower pole of the left kidney.  CT Chest w/o 5/25 >> diffuse bilateral patchy airspace disease, mutli-segment atelectasis at  the bases & small pleural effusions, ? Infiltrate LLL  CT ABD/Pelvis 5/25 >> diffuse small bowel & colonic distention with fluid levels as seen with ileus, ? Underlying bowel ischemia given patients known aortic dissection with proximal IMA occlusion and SMA flow via the narrow true lumen  CULTURES: BCx2 5/21 >>  UC 5/25 >>   ANTIBIOTICS: Azithro 5/21 >> 5/25  Clinda 5/21 >> 5/25 Meropenem 5/25 >>   SIGNIFICANT EVENTS: 5/19  Admit with sudden sharp chest pain > Type B Dissection   LINES/TUBES: RUE PICC 5/20 >>   DISCUSSION: 61 y/o F admitted with Type B aortic dissection.  Course complicated by ileus.    ASSESSMENT /  PLAN:  PULMONARY A: Acute Hypoxic Respiratory Failure - suspect multifactorial in the setting of immobility with critical illness, smoking history, LLL PNA and possible component of edema  LLL Infiltrate  Small L Effusion  ? COPD / Tobacco Abuse P:   Wean O2 for sats > 92% Push pulmonary hygiene - IS, flutter  Intermittent CXR  Lasix as renal function / BP permit  Minimize sedating medications as able  ABG now  Continue Dulera Smoking cessation counseling prior to discharge.   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 through true lumen, decreased left kidney flow, SMA occlusion, IMA occluded but reconstitutes via collaterals HTN  P:  BP control per CVTS  ICU monitoring  Esmolol gtt  Repeat lactic, co-ox, ABG now   RENAL A:   AKI - in setting of HTN P:   Trend BMP / urinary output Replace electrolytes as indicated Avoid nephrotoxic agents, ensure adequate renal perfusion  GASTROINTESTINAL A:   Ileus  GERD Gastroparesis  P:   NPO CCS following  Bowel rest  NGT to LIS TPN per pharmacy   HEMATOLOGIC A:   Leukocytosis  Anemia  Macrocytosis  Thrombocytopenia  P:  Trend CBC  Monitor for bleeding  Pt refuses blood products   INFECTIOUS A:   LLL Infiltrate  P:   ABX as above, D5/x Trend PCT, lactic acid   ENDOCRINE A:   DM II    P:   SSI   NEUROLOGIC A:   Pain  P:   RASS goal: n/a  Pain control per primary    FAMILY  - Updates:  Patient and husband updated at bedside.   - Inter-disciplinary family meet or Palliative Care meeting due by:  5/2  CC Time: 59 minutes  Noe Gens, NP-C [facility] Pgr: [phone] or if no answer [phone] 06/26/2016, 2:49 PM

## 2016-06-26 NOTE — Progress Notes (Signed)
Patient ID: ANAHIT KLUMB, female   DOB: 11/30/1955, 61 y.o.   MRN: [id number] EVENING ROUNDS NOTE :     [address]       [place] [address]             [phone]                     Total Length of Stay:  LOS: 6 days  BP (!) 118/49   Pulse 78   Temp 98.5 F (36.9 C)   Resp 19   Ht 5\' 3"  (1.6 m)   Wt 193 lb 9 oz (87.8 kg)   SpO2 94%   BMI 34.29 kg/m   .Intake/Output      05/25 0701 - 05/26 0700   P.O. 860   I.V. (mL/kg) 1377.1 (15.7)   Other 125   NG/GT    IV Piggyback 100   Total Intake(mL/kg) 2462.1 (28)   Urine (mL/kg/hr) 1875 (1.7)   Emesis/NG output 600 (0.6)   Total Output 2475   Net -12.9         . Marland KitchenTPN (CLINIMIX-E) Adult 50 mL/hr at 06/26/16 1712  . sodium chloride    . azithromycin Stopped (06/26/16 1040)  . esmolol 200 mcg/kg/min (06/26/16 1800)  . meropenem (MERREM) IV Stopped (06/26/16 1641)     Lab Results  Component Value Date   WBC 25.4 (H) 06/26/2016   HGB 11.8 (L) 06/26/2016   HCT 36.6 06/26/2016   PLT 94 (L) 06/26/2016   GLUCOSE 906 (HH) 06/26/2016   TRIG 506 (H) 06/24/2016   ALT 134 (H) 06/26/2016   AST 58 (H) 06/26/2016   NA 126 (L) 06/26/2016   K 5.4 (H) 06/26/2016   CL 98 (L) 06/26/2016   CREATININE 1.28 (H) 06/26/2016   BUN 46 (H) 06/26/2016   CO2 20 (L) 06/26/2016    Alert and less abdominal complaint then couple days ago Labs not accurate due drawing from pic, finger stick glucose ok Exchange pic for triple with tna going now.   [name]  Beeper [phone] Office [phone] 06/26/2016 7:24 PM

## 2016-06-27 ENCOUNTER — Inpatient Hospital Stay (HOSPITAL_COMMUNITY): Payer: BLUE CROSS/BLUE SHIELD

## 2016-06-27 ENCOUNTER — Encounter (HOSPITAL_COMMUNITY): Payer: Self-pay | Admitting: Radiology

## 2016-06-27 LAB — COMPREHENSIVE METABOLIC PANEL
ALT: 125 U/L — ABNORMAL HIGH (ref 14–54)
AST: 55 U/L — ABNORMAL HIGH (ref 15–41)
Albumin: 2.2 g/dL — ABNORMAL LOW (ref 3.5–5.0)
Alkaline Phosphatase: 128 U/L — ABNORMAL HIGH (ref 38–126)
Anion gap: 9 (ref 5–15)
BUN: 46 mg/dL — ABNORMAL HIGH (ref 6–20)
CO2: 22 mmol/L (ref 22–32)
Calcium: 8.8 mg/dL — ABNORMAL LOW (ref 8.9–10.3)
Chloride: 106 mmol/L (ref 101–111)
Creatinine, Ser: 1.11 mg/dL — ABNORMAL HIGH (ref 0.44–1.00)
GFR calc Af Amer: >60 mL/min (ref >60)
GFR calc non Af Amer: 53 mL/min — ABNORMAL LOW (ref >60)
Glucose, Bld: 168 mg/dL — ABNORMAL HIGH (ref 65–99)
Potassium: 4 mmol/L (ref 3.5–5.1)
Sodium: 137 mmol/L (ref 135–145)
Total Bilirubin: 0.8 mg/dL (ref 0.3–1.2)
Total Protein: 5.6 g/dL — ABNORMAL LOW (ref 6.5–8.1)

## 2016-06-27 LAB — CULTURE, BLOOD (ROUTINE X 2)
Culture: NO GROWTH
Culture: NO GROWTH
Special Requests: ADEQUATE
Special Requests: ADEQUATE

## 2016-06-27 LAB — GLUCOSE, CAPILLARY
Glucose-Capillary: 142 mg/dL — ABNORMAL HIGH (ref 65–99)
Glucose-Capillary: 156 mg/dL — ABNORMAL HIGH (ref 65–99)
Glucose-Capillary: 162 mg/dL — ABNORMAL HIGH (ref 65–99)
Glucose-Capillary: 190 mg/dL — ABNORMAL HIGH (ref 65–99)
Glucose-Capillary: 78 mg/dL (ref 65–99)

## 2016-06-27 LAB — CBC
HCT: 35.3 % — ABNORMAL LOW (ref 36.0–46.0)
Hemoglobin: 11.8 g/dL — ABNORMAL LOW (ref 12.0–15.0)
MCH: 33.4 pg (ref 26.0–34.0)
MCHC: 33.4 g/dL (ref 30.0–36.0)
MCV: 100 fL (ref 78.0–100.0)
Platelets: 103 10*3/uL — ABNORMAL LOW (ref 150–400)
RBC: 3.53 MIL/uL — ABNORMAL LOW (ref 3.87–5.11)
RDW: 13.7 % (ref 11.5–15.5)
WBC: 36.7 10*3/uL — ABNORMAL HIGH (ref 4.0–10.5)

## 2016-06-27 LAB — URINE CULTURE
Culture: NO GROWTH
Special Requests: NORMAL

## 2016-06-27 LAB — MAGNESIUM: Magnesium: 1.9 mg/dL (ref 1.7–2.4)

## 2016-06-27 LAB — PHOSPHORUS: Phosphorus: 4.3 mg/dL (ref 2.5–4.6)

## 2016-06-27 LAB — LACTIC ACID, PLASMA: Lactic Acid, Venous: 1.3 mmol/L (ref 0.5–1.9)

## 2016-06-27 MED ORDER — BUDESONIDE 0.5 MG/2ML IN SUSP
0.5000 mg | Freq: Two times a day (BID) | RESPIRATORY_TRACT | Status: DC
  Administered 2016-06-27 – 2016-08-21 (×100): 0.5 mg via RESPIRATORY_TRACT
  Filled 2016-06-27 (×111): qty 2

## 2016-06-27 MED ORDER — METOCLOPRAMIDE HCL 5 MG/ML IJ SOLN
5.0000 mg | Freq: Three times a day (TID) | INTRAMUSCULAR | Status: DC
  Administered 2016-06-27 – 2016-06-28 (×4): 5 mg via INTRAVENOUS
  Filled 2016-06-27 (×4): qty 2

## 2016-06-27 MED ORDER — FUROSEMIDE 10 MG/ML IJ SOLN
40.0000 mg | Freq: Once | INTRAMUSCULAR | Status: AC
  Administered 2016-06-27: 40 mg via INTRAVENOUS
  Filled 2016-06-27: qty 4

## 2016-06-27 MED ORDER — ARFORMOTEROL TARTRATE 15 MCG/2ML IN NEBU
15.0000 ug | INHALATION_SOLUTION | Freq: Two times a day (BID) | RESPIRATORY_TRACT | Status: DC
  Administered 2016-06-27 – 2016-08-21 (×102): 15 ug via RESPIRATORY_TRACT
  Filled 2016-06-27 (×109): qty 2

## 2016-06-27 MED ORDER — TRACE MINERALS CR-CU-MN-SE-ZN 10-1000-500-60 MCG/ML IV SOLN
INTRAVENOUS | Status: AC
  Administered 2016-06-27: 18:00:00 via INTRAVENOUS
  Filled 2016-06-27: qty 1992

## 2016-06-27 MED ORDER — IOPAMIDOL (ISOVUE-370) INJECTION 76%
INTRAVENOUS | Status: AC
  Administered 2016-06-27: 100 mL
  Filled 2016-06-27: qty 100

## 2016-06-27 MED ORDER — LORAZEPAM 2 MG/ML IJ SOLN
0.5000 mg | INTRAMUSCULAR | Status: DC | PRN
  Administered 2016-06-27 – 2016-07-03 (×5): 1 mg via INTRAVENOUS
  Filled 2016-06-27 (×5): qty 1

## 2016-06-27 MED ORDER — MAGNESIUM SULFATE 2 GM/50ML IV SOLN
2.0000 g | Freq: Once | INTRAVENOUS | Status: AC
Start: 2016-06-27 — End: 2016-06-27
  Administered 2016-06-27: 2 g via INTRAVENOUS
  Filled 2016-06-27: qty 50

## 2016-06-27 NOTE — Progress Notes (Signed)
Patient ID: Haley Graham, female   DOB: 06-03-1955, 61 y.o.   MRN: [id number] No change from a clinical standpoint. Reviewed her CT angiogram with the interpreting radiologist, Dr.Gerhardt and 2 of my partners. Dr.Gerhardt and I had an extensive discussion at the bedside with the patient her husband and daughter. Her CT does show some inflammation and dilatation around her aorta at the level of dissection and irregularity in her left subclavian artery. Flow in her celiac and superior mesenteric artery via the true lumen however this is somewhat been narrowed compared to the study from 06/20/2016 by dilatation of the false lumen. She has lost flow to the main left renal artery and saphenous some flow through a small lower pole left renal artery. She has a normal flow to her right kidney via the false lumen and has reentry at some point in her iliac vessels with normal flow in the common femoral arteries bilaterally.  We discussed at length the very difficult management of this situation. Currently she is having less abdominal pain and is passing some gas. She does not have increase in abdominal tenderness. Her CT scan did not show any evidence of bowel ischemia. Would be very difficult for endovascular or open treatment of her current condition and certainly feel that these options would be much more risky than observation in acute phase. We'll continue support as we are doing. The family asked specifically about the chance for metallic: Certainly felt that my guess was that she would have better than 50% chance of survival but that her mortality for this event may approach is much as 30%. Explained that this was certainly no scientific basis for this estimation. Will continue current support

## 2016-06-27 NOTE — Progress Notes (Signed)
Peripherally Inserted Central Catheter/Midline Placement  The IV Nurse has discussed with the patient and/or persons authorized to consent for the patient, the purpose of this procedure and the potential benefits and risks involved with this procedure.  The benefits include less needle sticks, lab draws from the catheter, and the patient may be discharged home with the catheter. Risks include, but not limited to, infection, bleeding, blood clot (thrombus formation), and puncture of an artery; nerve damage and irregular heartbeat and possibility to perform a PICC exchange if needed/ordered by physician.  Alternatives to this procedure were also discussed.  Bard Power PICC patient education guide, fact sheet on infection prevention and patient information card has been provided to patient /or left at bedside.    PICC/Midline Placement Documentation        Ashika Apuzzo, Nicolette Bang 06/27/2016, 4:58 PM

## 2016-06-27 NOTE — Progress Notes (Signed)
PULMONARY / CRITICAL CARE MEDICINE   Name: Haley Graham MRN: [id number] DOB: Jan 16, 1956    ADMISSION DATE:  06/20/2016 CONSULTATION DATE:  06/26/16  REFERRING MD:  Dr. 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  Course complicated by hypoxia from pneumonia, ileus, AKI.  SUBJECTIVE:  Breathing okay.  Denies chest pain.  VITAL SIGNS: BP 110/87   Pulse 73   Temp 98.3 F (36.8 C) (Oral)   Resp (!) 23   Ht 5\' 3"  (1.6 m)   Wt 192 lb 14.4 oz (87.5 kg)   SpO2 96%   BMI 34.17 kg/m   INTAKE / OUTPUT: I/O last 3 completed shifts: In: 5514.6 [P.O.:860; I.V.:4329.6; Other:125; IV Piggyback:200] Out: 5852 [Urine:3295; Emesis/NG DPOEUM:3536]  PHYSICAL EXAMINATION:  General - alert Eyes - pupils reactive ENT - no sinus tenderness, no oral exudate, no LAN Cardiac - regular, no murmur Chest - faint b/l rales Abd - soft, non tender Ext - no edema Skin - no rashes Neuro - normal strength Psych - anxious  LABS:  BMET  Recent Labs Lab 06/26/16 0535 06/26/16 1715 06/27/16 0340  NA 137 126* 137  K 4.0 5.4* 4.0  CL 108 98* 106  CO2 21* 20* 22  BUN 48* 46* 46*  CREATININE 1.26* 1.28* 1.11*  GLUCOSE 203* 906* 168*    Electrolytes  Recent Labs Lab 06/25/16 0643 06/26/16 0535 06/26/16 1715 06/27/16 0340  CALCIUM 8.6* 8.7* 8.7* 8.8*  MG 2.2 1.8  --  1.9  PHOS 3.1 4.3  --  4.3    CBC  Recent Labs Lab 06/26/16 0420 06/26/16 0535 06/27/16 0340  WBC 24.0* 25.4* 36.7*  HGB 10.0* 11.8* 11.8*  HCT 35.1* 36.6 35.3*  PLT 87* 94* 103*    Coag's No results for input(s): APTT, INR in the last 168 hours.  Sepsis Markers  Recent Labs Lab 06/24/16 0315 06/26/16 1553 06/27/16 0340  LATICACIDVEN 1.7 1.5 1.3    ABG  Recent Labs Lab 06/23/16 0338 06/24/16 0332 06/26/16 1512  PHART 7.502* 7.430 7.429  PCO2ART  31.1* 33.1 32.3  PO2ART 103.0 68.9* 66.0*    Liver Enzymes  Recent Labs Lab 06/26/16 0535 06/26/16 1715 06/27/16 0340  AST 68* 58* 55*  ALT 174* 134* 125*  ALKPHOS 100 100 128*  BILITOT 0.6 0.9 0.8  ALBUMIN 2.4* 2.1* 2.2*    Cardiac Enzymes  Recent Labs Lab 06/21/16 0328  TROPONINI 0.04*    Glucose  Recent Labs Lab 06/26/16 0738 06/26/16 1124 06/26/16 1613 06/26/16 2047 06/27/16 0012 06/27/16 0335  GLUCAP 194* 209* 190* 159* 142* 162*    Imaging Ct Abdomen Pelvis Wo Contrast  Result Date: 06/26/2016 CLINICAL DATA:  Shortness of breath EXAM: CT CHEST, ABDOMEN AND PELVIS WITHOUT CONTRAST TECHNIQUE: Multidetector CT imaging of the chest, abdomen and pelvis was performed following the standard protocol without IV contrast. COMPARISON:  06/20/2016 chest CT FINDINGS: CT CHEST FINDINGS Cardiovascular: Normal heart size. No pericardial effusion. 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 Mild haziness around the upper descending segment is likely stable. Stable maximal diameter of 33 mm at this level. No intramural hematoma. Intermittently seen displaced intimal calcification. Mediastinum/Nodes: Negative for adenopathy. Right upper extremity PICC with tip at the SVC level. Lungs/Pleura: Multi segment atelectasis, worse on the left. Small pleural effusions. There is patchy ground-glass airspace density in  the bilateral lungs without gradient. Airways are clear and there is no septal thickening. Musculoskeletal: No acute or aggressive finding. CT ABDOMEN PELVIS FINDINGS Hepatobiliary: Hepatic steatosis.Cholecystectomy with normal common bile duct diameter. Pancreas: Unremarkable. Spleen: Unremarkable. 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 The larger measures up to 27 mm and is indeterminate by densitometry, but left adrenal mass has been noted since at least 2006 abdominal CT report,  images not available. Left renal cyst. No hydronephrosis or urolithiasis. 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 Negative decompressed urinary bladder. Stomach/Bowel: There is diffuse small bowel distention and fluid filling with mildly hazy mesenteries. Similar features in the colon proximally and at the transverse segment. No pneumatosis or perforation is noted. Nasogastric tube tip is at the pylorus. Vascular/Lymphatic: There is intimal flap displacement from known dissection, morphology appearing similar to prior. No mass or adenopathy. Reproductive:Hysterectomy.  Unremarkable ovaries. Other: No ascites or pneumoperitoneum.  Anasarca. Musculoskeletal: No acute abnormalities. These results were called by telephone at the time of interpretation on 06/26/2016 at 2:05 pm to Dr. Ivin Poot , who verbally acknowledged these results. IMPRESSION: 1. Diffuse airspace disease. Pattern can be seen with noncardiogenic edema (ARDS), inflammatory pneumonitis, or atypical infection. Multi segment atelectasis at the bases and small pleural effusions. 2. Diffuse small bowel and colonic distention with fluid levels as seen with ileus. Question underlying bowel ischemia given the patient's known aortic dissection with proximal IMA occlusion and SMA flow via the narrow true lumen. 3. Incidental findings noted above. Electronically Signed   By: Monte Fantasia M.D.   On: 06/26/2016 14:05   Ct Chest Wo Contrast  Result Date: 06/26/2016 CLINICAL DATA:  Shortness of breath EXAM: CT CHEST, ABDOMEN AND PELVIS WITHOUT CONTRAST TECHNIQUE: Multidetector CT imaging of the chest, abdomen and pelvis was performed following the standard protocol without IV contrast. COMPARISON:  06/20/2016 chest CT FINDINGS: CT CHEST FINDINGS Cardiovascular: Normal heart size. No pericardial effusion. 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 Mild haziness around the upper descending segment is likely  stable. Stable maximal diameter of 33 mm at this level. No intramural hematoma. Intermittently seen displaced intimal calcification. Mediastinum/Nodes: Negative for adenopathy. Right upper extremity PICC with tip at the SVC level. Lungs/Pleura: Multi segment atelectasis, worse on the left. Small pleural effusions. There is patchy ground-glass airspace density in the bilateral lungs without gradient. Airways are clear and there is no septal thickening. Musculoskeletal: No acute or aggressive finding. CT ABDOMEN PELVIS FINDINGS Hepatobiliary: Hepatic steatosis.Cholecystectomy with normal common bile duct diameter. Pancreas: Unremarkable. Spleen: Unremarkable. 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 The larger measures up to 27 mm and is indeterminate by densitometry, but left adrenal mass has been noted since at least 2006 abdominal CT report, images not available. Left renal cyst. No hydronephrosis or urolithiasis. 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 Negative decompressed urinary bladder. Stomach/Bowel: There is diffuse small bowel distention and fluid filling with mildly hazy mesenteries. Similar features in the colon proximally and at the transverse segment. No pneumatosis or perforation is noted. Nasogastric tube tip is at the pylorus. Vascular/Lymphatic: There is intimal flap displacement from known dissection, morphology appearing similar to prior. No mass or adenopathy. Reproductive:Hysterectomy.  Unremarkable ovaries. Other: No ascites or pneumoperitoneum.  Anasarca. Musculoskeletal: No acute abnormalities. These results were called by telephone  at the time of interpretation on 06/26/2016 at 2:05 pm to Dr. Ivin Poot , who verbally acknowledged these results. IMPRESSION: 1. Diffuse airspace disease. Pattern can be seen with noncardiogenic edema (ARDS), inflammatory pneumonitis, or atypical infection.  Multi segment atelectasis at the bases and small pleural effusions. 2. Diffuse small bowel and colonic distention with fluid levels as seen with ileus. Question underlying bowel ischemia given the patient's known aortic dissection with proximal IMA occlusion and SMA flow via the narrow true lumen. 3. Incidental findings noted above. Electronically Signed   By: Monte Fantasia M.D.   On: 06/26/2016 14:05   Dg Chest Port 1 View  Result Date: 06/27/2016 CLINICAL DATA:  ARDS. EXAM: PORTABLE CHEST 1 VIEW COMPARISON:  Jun 26, 2016 FINDINGS: The right PICC line is stable. The NG tube terminates below today's study. Diffuse bilateral primarily interstitial opacities are mildly more homogeneous in less patchy in appearance. More focal opacity in the left base is stable. No other changes. IMPRESSION: 1. Diffuse bilateral primarily interstitial opacities suggesting edema remain. More focal opacity in the left base is stable to mildly improved. Electronically Signed   By: Dorise Bullion III M.D   On: 06/27/2016 07:21   Dg Abd Portable 1v  Result Date: 06/27/2016 CLINICAL DATA:  Ileus. EXAM: PORTABLE ABDOMEN - 1 VIEW COMPARISON:  Jun 26, 2016 FINDINGS: The NG tube terminates within the stomach. The bowel gas pattern is unremarkable on today's study. No other acute abnormalities. IMPRESSION: The NG tube terminates within the stomach. The bowel gas pattern is unremarkable. Electronically Signed   By: Dorise Bullion III M.D   On: 06/27/2016 07:19     STUDIES:  CTA Chest, ABD, Pelvis 06/29/2022 >> Type B aortic dissection beginning just beyond the origin of the great vessels from the aortic arch.  This involves the descending thoracic aorta, abdominal aorta and iliac vessels. The true lumen is compressed by the larger false lumen, and is thrombosed below the SMA/renal artery origins. The dissection appears to extend into the left renal artery with partial occlusion and areas of infarct in the mid and lower pole of the left  kidney.  CT Chest w/o 5/25 >> diffuse bilateral patchy airspace disease, mutli-segment atelectasis at the bases & small pleural effusions, ? Infiltrate LLL  CT ABD/Pelvis 5/25 >> diffuse small bowel & colonic distention with fluid levels as seen with ileus, ? Underlying bowel ischemia given patients known aortic dissection with proximal IMA occlusion and SMA flow via the narrow true lumen  CULTURES: BCx2 5/21 >>  UC 5/25 >> negative  ANTIBIOTICS: Azithro 5/21 >> 5/25  Clinda 5/21 >> 5/25 Meropenem 5/25 >>   SIGNIFICANT EVENTS: 5/19  Admit with sudden sharp chest pain > Type B Dissection   LINES/TUBES: RUE PICC 5/20 >>   DISCUSSION: 61 y/o F admitted with Type B aortic dissection.  Course complicated by ileus, hypoxia with pneumonia.  ASSESSMENT / PLAN:  Acute hypoxic respiratory failure from PNA, pulmonary edema. Tobacco abuse. - oxygen to keep SpO2 > 92% - f/u CXR - scheduled BDs - continue Abx  Type B aortic dissection. Hx of HTN. - catapres patch, lasix, lopressor, NTG patch, esmolol gtt  Acute renal failure. Hyperkalemia. - monitor renal f/u - f/u BMET  Ileus. - f/u CT chest/abd/pelvis 5/26  Thrombocytopenia. - f/u CBC  DM type II. - SSI  Anxiety. Pain control. - prn ativan - duragesic  DVT prophylaxis - SCDs SUP - protonix Nutrition - TNA Goals of care -  full code  Updated husband at bedside  CC time 88 minutes  Chesley Mires, MD [place] 06/27/2016, 10:15 AM Pager:  [phone] After 3pm call: [phone]

## 2016-06-27 NOTE — Progress Notes (Signed)
Subjective: Interval History: none.. Continues to have abdominal soreness. No bowel movements. Continue with NG suction.   Objective: Vital signs in last 24 hours: Temp:  [97.5 F (36.4 C)-98.6 F (37 C)] 98.3 F (36.8 C) (05/26 0330) Pulse Rate:  [72-83] 77 (05/26 1015) Resp:  [16-31] 25 (05/26 1015) BP: (94-160)/(43-87) 118/52 (05/26 1015) SpO2:  [91 %-100 %] 98 % (05/26 1015) Weight:  [192 lb 14.4 oz (87.5 kg)] 192 lb 14.4 oz (87.5 kg) (05/26 0330)  Intake/Output from previous day: 05/25 0701 - 05/26 0700 In: 3788.7 [P.O.:860; I.V.:2703.7; IV Piggyback:100] Out: 4295 [Urine:2795; Emesis/NG output:1500] Intake/Output this shift: Total I/O In: 550.2 [I.V.:370.2; NG/GT:30; IV Piggyback:150] Out: 1425 [Urine:1425]  Abdominal distention. Tenderness somewhat less than it had been. In shaking her hips that she does not have any lower quadrant soreness which is improved.  Lab Results:  Recent Labs  06/26/16 0535 06/27/16 0340  WBC 25.4* 36.7*  HGB 11.8* 11.8*  HCT 36.6 35.3*  PLT 94* 103*   BMET  Recent Labs  06/26/16 1715 06/27/16 0340  NA 126* 137  K 5.4* 4.0  CL 98* 106  CO2 20* 22  GLUCOSE 906* 168*  BUN 46* 46*  CREATININE 1.28* 1.11*  CALCIUM 8.7* 8.8*    Studies/Results: Ct Abdomen Pelvis Wo Contrast  Result Date: 06/26/2016 CLINICAL DATA:  Shortness of breath EXAM: CT CHEST, ABDOMEN AND PELVIS WITHOUT CONTRAST TECHNIQUE: Multidetector CT imaging of the chest, abdomen and pelvis was performed following the standard protocol without IV contrast. COMPARISON:  06/20/2016 chest CT FINDINGS: CT CHEST FINDINGS Cardiovascular: Normal heart size. No pericardial effusion. 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 Mild haziness around the upper descending segment is likely stable. Stable maximal diameter of 33 mm at this level. No intramural hematoma. Intermittently seen displaced intimal calcification. Mediastinum/Nodes: Negative for adenopathy.  Right upper extremity PICC with tip at the SVC level. Lungs/Pleura: Multi segment atelectasis, worse on the left. Small pleural effusions. There is patchy ground-glass airspace density in the bilateral lungs without gradient. Airways are clear and there is no septal thickening. Musculoskeletal: No acute or aggressive finding. CT ABDOMEN PELVIS FINDINGS Hepatobiliary: Hepatic steatosis.Cholecystectomy with normal common bile duct diameter. Pancreas: Unremarkable. Spleen: Unremarkable. 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 The larger measures up to 27 mm and is indeterminate by densitometry, but left adrenal mass has been noted since at least 2006 abdominal CT report, images not available. Left renal cyst. No hydronephrosis or urolithiasis. 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 Negative decompressed urinary bladder. Stomach/Bowel: There is diffuse small bowel distention and fluid filling with mildly hazy mesenteries. Similar features in the colon proximally and at the transverse segment. No pneumatosis or perforation is noted. Nasogastric tube tip is at the pylorus. Vascular/Lymphatic: There is intimal flap displacement from known dissection, morphology appearing similar to prior. No mass or adenopathy. Reproductive:Hysterectomy.  Unremarkable ovaries. Other: No ascites or pneumoperitoneum.  Anasarca. Musculoskeletal: No acute abnormalities. These results were called by telephone at the time of interpretation on 06/26/2016 at 2:05 pm to Dr. Ivin Poot , who verbally acknowledged these results. IMPRESSION: 1. Diffuse airspace disease. Pattern can be seen with noncardiogenic edema (ARDS), inflammatory pneumonitis, or atypical infection. Multi segment atelectasis at the bases and small pleural effusions. 2. Diffuse small bowel and colonic distention with fluid levels as seen with ileus. Question underlying bowel  ischemia given the patient's  known aortic dissection with proximal IMA occlusion and SMA flow via the narrow true lumen. 3. Incidental findings noted above. Electronically Signed   By: Monte Fantasia M.D.   On: 06/26/2016 14:05   Ct Chest Wo Contrast  Result Date: 06/26/2016 CLINICAL DATA:  Shortness of breath EXAM: CT CHEST, ABDOMEN AND PELVIS WITHOUT CONTRAST TECHNIQUE: Multidetector CT imaging of the chest, abdomen and pelvis was performed following the standard protocol without IV contrast. COMPARISON:  06/20/2016 chest CT FINDINGS: CT CHEST FINDINGS Cardiovascular: Normal heart size. No pericardial effusion. 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 Mild haziness around the upper descending segment is likely stable. Stable maximal diameter of 33 mm at this level. No intramural hematoma. Intermittently seen displaced intimal calcification. Mediastinum/Nodes: Negative for adenopathy. Right upper extremity PICC with tip at the SVC level. Lungs/Pleura: Multi segment atelectasis, worse on the left. Small pleural effusions. There is patchy ground-glass airspace density in the bilateral lungs without gradient. Airways are clear and there is no septal thickening. Musculoskeletal: No acute or aggressive finding. CT ABDOMEN PELVIS FINDINGS Hepatobiliary: Hepatic steatosis.Cholecystectomy with normal common bile duct diameter. Pancreas: Unremarkable. Spleen: Unremarkable. 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 The larger measures up to 27 mm and is indeterminate by densitometry, but left adrenal mass has been noted since at least 2006 abdominal CT report, images not available. Left renal cyst. No hydronephrosis or urolithiasis. 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 Negative decompressed urinary bladder. Stomach/Bowel: There is diffuse small bowel distention and fluid filling with mildly hazy  mesenteries. Similar features in the colon proximally and at the transverse segment. No pneumatosis or perforation is noted. Nasogastric tube tip is at the pylorus. Vascular/Lymphatic: There is intimal flap displacement from known dissection, morphology appearing similar to prior. No mass or adenopathy. Reproductive:Hysterectomy.  Unremarkable ovaries. Other: No ascites or pneumoperitoneum.  Anasarca. Musculoskeletal: No acute abnormalities. These results were called by telephone at the time of interpretation on 06/26/2016 at 2:05 pm to Dr. Ivin Poot , who verbally acknowledged these results. IMPRESSION: 1. Diffuse airspace disease. Pattern can be seen with noncardiogenic edema (ARDS), inflammatory pneumonitis, or atypical infection. Multi segment atelectasis at the bases and small pleural effusions. 2. Diffuse small bowel and colonic distention with fluid levels as seen with ileus. Question underlying bowel ischemia given the patient's known aortic dissection with proximal IMA occlusion and SMA flow via the narrow true lumen. 3. Incidental findings noted above. Electronically Signed   By: Monte Fantasia M.D.   On: 06/26/2016 14:05   US Abdomen Complete  Result Date: 06/02/2016 CLINICAL DATA:  Nausea and abdominal distention EXAM: ABDOMEN ULTRASOUND COMPLETE COMPARISON:  None. FINDINGS: Gallbladder: Surgically removed Common bile duct: Diameter: 6 mm Liver: Increased in echogenicity consistent with fatty infiltration. No significant nodularity is noted. IVC: No abnormality visualized. Pancreas: Visualized portion unremarkable. Spleen: Size and appearance within normal limits. Right Kidney: Length: 12.5 cm. Echogenicity within normal limits. No mass or hydronephrosis visualized. Left Kidney: Length: 13.6 cm. Echogenicity within normal limits. No mass or hydronephrosis visualized. 2.2 cm cyst is noted in the midportion of the kidney anteriorly. This has increased in size from prior exam at which time it  measured approximately 17 mm. Abdominal aorta: No aneurysm visualized. Other findings: None. IMPRESSION: Simple Left renal cyst increased in size by 5 mm. Status post cholecystectomy. Fatty infiltration of the liver without focal mass. Electronically Signed  By: Inez Catalina M.D.   On: 06/02/2016 08:55   Dg Chest Port 1 View  Result Date: 06/27/2016 CLINICAL DATA:  ARDS. EXAM: PORTABLE CHEST 1 VIEW COMPARISON:  Jun 26, 2016 FINDINGS: The right PICC line is stable. The NG tube terminates below today's study. Diffuse bilateral primarily interstitial opacities are mildly more homogeneous in less patchy in appearance. More focal opacity in the left base is stable. No other changes. IMPRESSION: 1. Diffuse bilateral primarily interstitial opacities suggesting edema remain. More focal opacity in the left base is stable to mildly improved. Electronically Signed   By: Dorise Bullion III M.D   On: 06/27/2016 07:21   Dg Chest Port 1 View  Result Date: 06/26/2016 CLINICAL DATA:  Shortness of breath. EXAM: PORTABLE CHEST 1 VIEW COMPARISON:  Radiograph of Jun 25, 2016. FINDINGS: Stable cardiomegaly. Increased bilateral diffuse interstitial and basilar opacities are noted concerning for worsening edema. No pneumothorax is noted. Mild left pleural effusion is noted with associated atelectasis or infiltrate. Right-sided PICC line is unchanged in position. Nasogastric tube is unchanged in position. Bony thorax is unremarkable. IMPRESSION: Increased bilateral diffuse interstitial densities consistent with edema. Mild left pleural effusion is noted with associated atelectasis or infiltrate. Electronically Signed   By: Marijo Conception, M.D.   On: 06/26/2016 07:40   Dg Chest Port 1 View  Result Date: 06/25/2016 CLINICAL DATA:  Shortness of Breath EXAM: PORTABLE CHEST 1 VIEW COMPARISON:  06/24/2016 FINDINGS: Cardiac shadow is prominent but stable. Right-sided PICC line and nasogastric catheter are again seen and stable.  Bibasilar atelectatic changes are noted worse on the left than the right and increased from the prior exam. Small left pleural effusion is noted as well. Mild vascular congestion is seen. IMPRESSION: Increasing bibasilar infiltrates left greater than right. Mild vascular congestion is noted. The Electronically Signed   By: Inez Catalina M.D.   On: 06/25/2016 07:54   Dg Chest Port 1 View  Result Date: 06/24/2016 CLINICAL DATA:  Shortness of Breath EXAM: PORTABLE CHEST 1 VIEW COMPARISON:  06/23/2016 FINDINGS: Right-sided PICC line is again noted at the cavoatrial junction. Nasogastric catheter is noted in satisfactory position through the overall inspiratory effort is decreased from the prior exam. Mild left basilar atelectasis remains. No other focal abnormality is noted. IMPRESSION: Stable left basilar atelectasis. Electronically Signed   By: Inez Catalina M.D.   On: 06/24/2016 08:07   Dg Chest Port 1 View  Result Date: 06/23/2016 CLINICAL DATA:  61 year old female with shortness of breath and abdominal pain. 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 Being treated conservatively at this time, including with bowel rest. EXAM: PORTABLE CHEST 1 VIEW COMPARISON:  Chest radiographs 06/22/2016 and earlier. CTA 06/20/2016 FINDINGS: Portable AP semi upright view at 0754 hours. Stable enteric tube which courses to the abdomen. Stable right PICC line. Mildly larger lung volumes and decreased streaky bibasilar opacity. Mild residual. No pneumothorax or pulmonary edema. No pleural effusion or consolidation. Stable cardiac size and mediastinal contours. IMPRESSION: 1.  Stable lines and tubes. 2. Mildly improved lung volumes and regressed bibasilar opacity favored to be atelectasis. Electronically Signed   By: Genevie Ann M.D.   On: 06/23/2016 08:19   Dg Chest Port 1 View  Result Date: 06/22/2016 CLINICAL DATA:   61 year old female with history of abdominal aortic aneurysm. Shortness of breath and vomiting. EXAM: PORTABLE CHEST 1 VIEW COMPARISON:  Chest x-ray 06/21/2016.  FINDINGS: There is a right upper extremity PICC with tip terminating in the superior cavoatrial junction. A nasogastric tube is seen extending into the stomach, however, the tip of the nasogastric tube extends below the lower margin of the image. Lung volumes are low. Linear bibasilar opacities favored to reflect areas of subsegmental atelectasis, however, underlying airspace consolidation from infection or aspiration is not excluded. No pleural effusions. No evidence of pulmonary edema. Heart size is normal. Upper mediastinal contours are within normal limits. IMPRESSION: 1. Support apparatus, as above. 2. Low lung volumes with bibasilar areas of atelectasis and/or airspace consolidation. Electronically Signed   By: Vinnie Langton M.D.   On: 06/22/2016 07:30   Dg Chest Port 1 View  Result Date: 06/21/2016 CLINICAL DATA:  Aortic dissection EXAM: PORTABLE CHEST 1 VIEW COMPARISON:  06/20/2016 FINDINGS: Bibasilar atelectasis. Heart is normal size. No effusions or acute bony abnormality. IMPRESSION: Bibasilar atelectasis. Electronically Signed   By: Rolm Baptise M.D.   On: 06/21/2016 07:18   Dg Chest Port 1 View  Result Date: 06/20/2016 CLINICAL DATA:  Hypoxia, unresponsive EXAM: PORTABLE CHEST 1 VIEW COMPARISON:  CTA chest dated 06/20/2016 FINDINGS: Lungs are essentially clear. No focal consolidation. No pleural effusion or pneumothorax. The heart is top-normal in size. IMPRESSION: No evidence of acute cardiopulmonary disease. Electronically Signed   By: Julian Hy M.D.   On: 06/20/2016 17:01   Dg Chest Port 1 View  Result Date: 06/20/2016 CLINICAL DATA:  Sudden onset sharp substernal chest pain that radiates to the back. Nausea, dizziness and shortness of breath. EXAM: PORTABLE CHEST 1 VIEW COMPARISON:  None. FINDINGS: Trachea is midline.  Heart size is accentuated by AP supine technique. Probable mild scarring in the right middle lobe and lingula. Lungs are otherwise clear. No pleural fluid. IMPRESSION: No acute findings. Electronically Signed   By: Lorin Picket M.D.   On: 06/20/2016 09:56   Dg Abd Portable 1v  Result Date: 06/27/2016 CLINICAL DATA:  Ileus. EXAM: PORTABLE ABDOMEN - 1 VIEW COMPARISON:  Jun 26, 2016 FINDINGS: The NG tube terminates within the stomach. The bowel gas pattern is unremarkable on today's study. No other acute abnormalities. IMPRESSION: The NG tube terminates within the stomach. The bowel gas pattern is unremarkable. Electronically Signed   By: Dorise Bullion III M.D   On: 06/27/2016 07:19   Dg Abd Portable 1v  Result Date: 06/26/2016 CLINICAL DATA:  Shortness of breath, abdominal pain EXAM: PORTABLE ABDOMEN - 1 VIEW COMPARISON:  06/25/2016 FINDINGS: NG tube tip is in the distal stomach. Prior cholecystectomy. Nonobstructive bowel gas pattern. No free air organomegaly. IMPRESSION: NG tube tip in the distal stomach.  No acute findings. Electronically Signed   By: Rolm Baptise M.D.   On: 06/26/2016 07:36   Dg Abd Portable 1v  Result Date: 06/25/2016 CLINICAL DATA:  Shortness of breath.  Abdominal pain. EXAM: PORTABLE ABDOMEN - 1 VIEW COMPARISON:  Jun 24, 2016 FINDINGS: The distal tip of the NG tube is near the gastric antrum. Mild opacity in left lung base will be better assessed on today's chest x-ray. No free air or portal venous gas identified although evaluation is limited on supine imaging. No bowel obstruction. IMPRESSION: No interval change or acute abnormality in the abdomen. Electronically Signed   By: Dorise Bullion III M.D   On: 06/25/2016 07:54   Dg Abd Portable 1v  Result Date: 06/24/2016 CLINICAL DATA:  Abdominal distention. EXAM: PORTABLE ABDOMEN - 1 VIEW COMPARISON:  06/23/2016 . FINDINGS: NG tube noted  in stable position with tip in the stomach . Surgical clips right upper quadrant.  Several nonspecific loops of air-filled small bowel again noted. Colonic gas pattern is normal. No free air. IMPRESSION: 1.  NG tube in stable position. 2. Several nonspecific loops of air-filled small bowel again noted. No evidence of progressive bowel distention. Colonic gas pattern normal. No free air. Electronically Signed   By: Marcello Moores  Register   On: 06/24/2016 08:06   Dg Abd Portable 1v  Result Date: 06/23/2016 CLINICAL DATA:  61 year old female with shortness of breath and abdominal pain. 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 Being treated conservatively at this time, including with bowel rest. EXAM: PORTABLE ABDOMEN - 1 VIEW COMPARISON:  Abdominal radiographs 06/22/2016. CTA 06/20/2016, and earlier. FINDINGS: Portable AP supine view at 0759 hours. Stable NG tube, side hole to level of the gastric body and tip at the level of the gastric antrum. Stable cholecystectomy clips. Stable bowel gas pattern with several gas containing nondilated small and large bowel loops. No definite pneumoperitoneum on this supine view. Abdominal and pelvic visceral contours appear stable. No acute osseous abnormality identified. IMPRESSION: 1. Stable NG tube position. 2. Stable, nonobstructed bowel-gas pattern. Electronically Signed   By: Genevie Ann M.D.   On: 06/23/2016 08:18   Dg Abd Portable 1v  Result Date: 06/22/2016 CLINICAL DATA:  Abdominal pain for few days with nausea and vomiting. EXAM: PORTABLE ABDOMEN - 1 VIEW COMPARISON:  06/22/2016 abdominal radiograph. FINDINGS: Enteric tube loops in the gastric fundus and terminates in the body of the stomach, without appreciable kink. Top-normal caliber small bowel loops throughout the abdomen. Minimal colonic stool. No evidence of pneumatosis or pneumoperitoneum. No radiopaque urolithiasis. Cholecystectomy clips are seen in the right upper quadrant of the  abdomen. Patchy opacities at the lung bases. IMPRESSION: 1. Enteric tube loops in the gastric fundus and terminates in the body of the stomach without appreciable kink. 2. Top-normal caliber small bowel loops, unchanged. 3. Patchy bibasilar lung opacities, correlate with chest radiograph. Electronically Signed   By: Ilona Sorrel M.D.   On: 06/22/2016 16:58   Dg Abd Portable 1v  Result Date: 06/22/2016 CLINICAL DATA:  Vomiting.  Shortness of breath.  AAA . EXAM: PORTABLE ABDOMEN - 1 VIEW COMPARISON:  06/21/2016. FINDINGS: Surgical clips right upper quadrant. NG tube noted with tip over the distal stomach. No bowel distention. No acute bony abnormality identified. Mild basilar atelectasis. IMPRESSION: NG tube noted with its tip over the distal stomach. No bowel distention or acute intra-abdominal abnormality identified. Electronically Signed   By: Marcello Moores  Register   On: 06/22/2016 07:30   Dg Abd Portable 1v  Result Date: 06/21/2016 CLINICAL DATA:  Feeding tube placement EXAM: PORTABLE ABDOMEN - 1 VIEW COMPARISON:  06/21/2016 FINDINGS: NG tube coils in the fundus of the stomach with the tip in the distal stomach. Decompression of the stomach. IMPRESSION: NG tube tip in the distal stomach. Electronically Signed   By: Rolm Baptise M.D.   On: 06/21/2016 15:46   Dg Abd Portable 1v  Result Date: 06/21/2016 CLINICAL DATA:  Ileus  Vomiting that started this AM per patient EXAM: PORTABLE ABDOMEN - 1 VIEW COMPARISON:  06/20/2016 FINDINGS: Gaseous distention of the stomach. Paucity of small bowel and colonic gas. Cholecystectomy clips. Linear scarring/ atelectasis in the lung bases. IMPRESSION: 1. Gaseous distention of the stomach. Electronically Signed   By: Eden Emms.D.  On: 06/21/2016 11:54   Dg Abd Portable 1v  Result Date: 06/20/2016 CLINICAL DATA:  Aortic dissection EXAM: PORTABLE ABDOMEN - 1 VIEW COMPARISON:  CT abdomen/ pelvis dated 06/20/2016 FINDINGS: Nonobstructive bowel gas pattern. Visualized  osseous structures are within normal limits. IMPRESSION: Unremarkable abdominal radiograph. Electronically Signed   By: Julian Hy M.D.   On: 06/20/2016 17:02   Ct Angio Chest/abd/pel For Dissection W And/or W/wo  Result Date: 06/20/2016 CLINICAL DATA:  Substernal chest pain radiating to back EXAM: CT ANGIOGRAPHY CHEST, ABDOMEN AND PELVIS TECHNIQUE: Multidetector CT imaging through the chest, abdomen and pelvis was performed using the standard protocol during bolus administration of intravenous contrast. Multiplanar reconstructed images and MIPs were obtained and reviewed to evaluate the vascular anatomy. CONTRAST:  100 cc Isovue 370 IV COMPARISON:  CT abdomen and pelvis 10/27/2011 FINDINGS: CTA CHEST FINDINGS Cardiovascular: There is the type B aortic dissection beginning in the distal aortic arch just beyond the origin of the great vessels. The true lumen is compressed by the false lumen. No aneurysm. No pulmonary embolus. Heart is normal size. Mediastinum/Nodes: No mediastinal, hilar, or axillary adenopathy. Lungs/Pleura: Atelectasis or scarring in the lingula. Lungs otherwise clear. No effusions. Musculoskeletal: No acute bony abnormality. Review of the MIP images confirms the above findings. CTA ABDOMEN AND PELVIS FINDINGS VASCULAR Aorta: Dissection continues throughout the abdominal aorta with the celiac artery and superior mesenteric artery arising from the true lumen anteriorly. The dissection may extend into the left renal artery where there is only a small amount of blood flow noted in the proximal and mid left renal artery. Right renal artery appears to arise from the false lumen and is patent. The the true lumen appears thrombosed below the origin of the superior mesenteric artery. Inferior mesenteric artery arises from the thrombosed true lumen and appears occluded proximally, reconstitutes several cm from the origin. Celiac: Patent. SMA: Patent. Renals: As above. IMA: As above. Inflow: To  the dissection continues into both common iliac arteries with thrombosed true lumens. The dissection appears to terminate at approximately the level of the common iliac bifurcation bilaterally. Veins: Grossly patent and unremarkable. Review of the MIP images confirms the above findings. NON-VASCULAR Hepatobiliary: Mild diffuse fatty infiltration. Prior cholecystectomy. Pancreas: No focal abnormality or ductal dilatation. Spleen: No focal abnormality.  Normal size. Adrenals/Urinary Tract: Nodules in the left adrenal gland are low-density on the precontrast imaging compatible with small adenomas. There are areas of non perfusion noted in the mid and lower poles of the left kidney compatible with infarction, likely related to the involvement of the left renal artery by dissection. Urinary bladder unremarkable. Stomach/Bowel: Stomach, large and small bowel grossly unremarkable. Lymphatic: No adenopathy. Reproductive: Prior hysterectomy.  No adnexal masses. Other: No free fluid or free air. Musculoskeletal: No acute bony abnormality. Review of the MIP images confirms the above findings. 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 This involves the descending thoracic aorta, abdominal aorta and iliac vessels. The true lumen is compressed by the larger false lumen, and is thrombosed below the SMA/renal artery origins. The dissection appears to extend into the left renal artery with partial occlusion and areas of infarct in the mid and lower pole of the left kidney. Fatty infiltration of the liver. Critical Value/emergent results were called by telephone at the time of interpretation on 06/20/2016 at 10:13 am to Dr. Fredia Sorrow , who verbally acknowledged these results. Electronically Signed   By: Lennette Bihari  [place] M.D.   On: 06/20/2016 10:15   Anti-infectives: Anti-infectives    Start     Dose/Rate Route Frequency Ordered Stop   06/26/16 0830   meropenem (MERREM) 1 g in sodium chloride 0.9 % 100 mL IVPB     1 g 200 mL/hr over 30 Minutes Intravenous Every 8 hours 06/26/16 0810     06/22/16 1800  clindamycin (CLEOCIN) IVPB 600 mg  Status:  Discontinued     600 mg 100 mL/hr over 30 Minutes Intravenous Every 8 hours 06/22/16 1723 06/26/16 0810   06/22/16 1000  azithromycin (ZITHROMAX) 250 mg in dextrose 5 % 125 mL IVPB  Status:  Discontinued     250 mg 125 mL/hr over 60 Minutes Intravenous Every 24 hours 06/22/16 0826 06/27/16 1019   06/22/16 0900  levofloxacin (LEVAQUIN) IVPB 500 mg  Status:  Discontinued     500 mg 100 mL/hr over 60 Minutes Intravenous Every 24 hours 06/22/16 0759 06/22/16 0824      Assessment/Plan: s/p * No surgery found * Had long discussion with the patient and her husband present. Continued with concern regarding elevated white count and a prolonged ileus. Continues to have 3+ dorsalis pedis pulses bilaterally. Recommend repeat CT angiogram of chest abdomen and pelvis. 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 Did discuss the slight risk of renal dysfunction worsening with contrast. Creatinine has continued to drop and is now 1.1.   LOS: 7 days   EarlySherren Mocha 06/27/2016, 10:35 AM

## 2016-06-27 NOTE — Progress Notes (Signed)
Patient ID: Haley Graham, female   DOB: 10-Nov-1955, 61 y.o.   MRN: [id number] EVENING ROUNDS NOTE :     [address]       [place] [address]             [phone]                     Total Length of Stay:  LOS: 7 days  BP (!) 118/50   Pulse 74   Temp 97.3 F (36.3 C) (Oral)   Resp (!) 26   Ht 5\' 3"  (1.6 m)   Wt 192 lb 14.4 oz (87.5 kg)   SpO2 98%   BMI 34.17 kg/m   .Intake/Output      05/26 0701 - 05/27 0700   P.O.    I.V. (mL/kg) 1437.3 (16.4)   Other    NG/GT 30   IV Piggyback 275   Total Intake(mL/kg) 1742.3 (19.9)   Urine (mL/kg/hr) 2275 (2)   Emesis/NG output 550 (0.5)   Total Output 2825   Net -1082.7         . Marland KitchenTPN (CLINIMIX-E) Adult 83 mL/hr at 06/27/16 1754  . sodium chloride    . esmolol 270 mcg/kg/min (06/27/16 1900)  . meropenem (MERREM) IV Stopped (06/27/16 1740)     Lab Results  Component Value Date   WBC 36.7 (H) 06/27/2016   HGB 11.8 (L) 06/27/2016   HCT 35.3 (L) 06/27/2016   PLT 103 (L) 06/27/2016   GLUCOSE 168 (H) 06/27/2016   TRIG 506 (H) 06/24/2016   ALT 125 (H) 06/27/2016   AST 55 (H) 06/27/2016   NA 137 06/27/2016   K 4.0 06/27/2016   CL 106 06/27/2016   CREATININE 1.11 (H) 06/27/2016   BUN 46 (H) 06/27/2016   CO2 22 06/27/2016   Cta ordered today , reviewed and discussed with Dr Early and family. Patient has loss flow to left kidney, still perfusing right kidney off false lumen, contrast in sma and celiac  off true lumen  But has narrowed some. Discussed these findings with patient , daughter and husband . On exam the patient abdomen is less distended and less tnder then several days ago. Currently do not see that any vascular intervention will improve situation and could result in catatropic mal perfusion    Continue to monitor fluid balance     [name]  Beeper [phone] Office [phone] 06/27/2016 7:53 PM

## 2016-06-27 NOTE — Plan of Care (Signed)
Problem: Nutritional: Goal: Risk for body nutrition deficit will decrease Outcome: Progressing Pt on TNA  Problem: Respiratory: Goal: Respiratory status will improve Outcome: Not Progressing Pt with slightly increased oxygen needs, labored breathing at times, needing treatments, frequent assessment, pulmonary toliet  Problem: Urinary Elimination: Goal: Ability to achieve and maintain adequate renal perfusion and functioning will improve Outcome: Progressing Close monitoring at this time

## 2016-06-27 NOTE — Progress Notes (Signed)
PHARMACY - ADULT TOTAL PARENTERAL NUTRITION CONSULT NOTE   Pharmacy Consult for TPN Indication: prolonged NPO status/high NG output   Patient Measurements: Height: '5\' 3"'  (160 cm) Weight: 192 lb 14.4 oz (87.5 kg) IBW/kg (Calculated) : 52.4 TPN AdjBW (KG): 60.3 Body mass index is 34.17 kg/m. 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 Patient had a significant hx of N/V for 2-4 weeks prior to admission. Weight appears to have been maintained. Patient had a NG tube placed 5/20 which has has significant output with 1-1.5 L a day.   GI: NG tube in place for drainage (1.5 L out yesterday) Lower abdominal pain improved. Prealbumin-14.8 and albumin down to 2.2. CVTS decreased TPN on 5/25 due to ARDs picture. Last BM was 5/19. Possible ileus with no bowel movements or gas? Increasing WBC count might be worrisome for C. Diff but no BMs yet. Phenergan Q 6 prn. Bisacodyl every morning Endo: Hx of DM. No insulin at home. CBGs 140-200s on Lantus and SSI. Glucose trending up since  TPN initiation.  Insulin requirements in the past 24 hours: 22 units of aspart; 65 units of Lantus  Lytes: Na-137, K-4.0, CoCa-10.1, Mg- 1.9 Phos-4.3 Renal:AKI: Scr back down to 1.11 (Baseline < 1). BUN-46. UOP good Pulm: 5 L [place]; Dulera Cards: Stanford type B aortic dissection; BP WNL-high, HR 70s in NSR. Esmolol gtt , lopressor increased to 7.5 mg Q 4 , clonidine 0.2 mg patch  Lasix added 5/24. CTA planned for today.  Hepatobil:AST/ALT trending down 55/125. Alk phos slightly elevated, Tbili WNL. Triglycerides elevated 506, recheck on 5/28 Neuro:Citalopram; Hydromorphone prn, Fentanyl patch  ID: D# 5 Azith for possible RLL pneumonia. Meropenem added 5/25. Clindamycin stopped. WBC up to 36.7. Afebrile. No growth on cultures. Repeating today.  Best Practices:PPI TPN Access:PICC line placed 5/20 TPN start date:5/22  Nutritional Goals (per  RD recommendation on 5/25): KCal: 2000-2200 kcal/day  Protein: 110-120 gm/day Goal TPN rate: Clinimix 5/15 E @ 83 ml/hr + lipid emulsion 20 ml/hr (Provides 1900 kcal and 100 gm of protein)  Current Nutrition:  TPN  Plan:  Increase Clinimix E 5/15 back up to 83 ml/hr tonight Hold IV lipid emulsions (Resume by 6/6) IVMF stopped per MD on 5/25 This provides 100 g of protein and 1414 kCals per day meeting 91% of protein and 71% of kCal needs Continue MVI in TPN Continue TE in TPN every other day, next 5/26 Continue Lantus 30 units BID Continue resistant SSI and adjust as needed Monitor TPN labs, daily Cmet F/U ability to transition to TFs  Give Mg 2g IV x 1  If bowel ischemia and ileus ruled out, consider placing Cortrak tube soon and trialing TFs  Elenor Quinones, PharmD, BCPS Clinical Pharmacist Pager [phone] 06/27/2016 7:13 AM

## 2016-06-27 NOTE — Progress Notes (Signed)
[facility] Progress Note    Subjective: Patient still not passing gas or having bowel movements.  Objective: Vital signs in last 24 hours: Temp:  [97.5 F (36.4 C)-98.6 F (37 C)] 98.3 F (36.8 C) (05/26 0330) Pulse Rate:  [73-83] 77 (05/26 0715) Resp:  [13-31] 19 (05/26 0715) BP: (94-160)/(43-84) 117/65 (05/26 0715) SpO2:  [91 %-100 %] 97 % (05/26 0715) Weight:  [87.5 kg (192 lb 14.4 oz)] 87.5 kg (192 lb 14.4 oz) (05/26 0330) Last BM Date: 06/20/16  Intake/Output from previous day: 05/25 0701 - 05/26 0700 In: 3788.7 [P.O.:860; I.V.:2703.7; IV Piggyback:100] Out: 4295 [Urine:2795; Emesis/NG output:1500] Intake/Output this shift: No intake/output data recorded.  General: Moderate distress.  900cc output from NGT last shift.  Lungs: Diminished breath sounds bilateral bases.  Rales left base  Abd: distended, hypoactive bowel sounds.  Extremities: No changes  Neuro: Intact  Lab Results:  @LABLAST2 (wbc:2,hgb:2,hct:2,plt:2) BMET ) Recent Labs  06/26/16 1715 06/27/16 0340  NA 126* 137  K 5.4* 4.0  CL 98* 106  CO2 20* 22  GLUCOSE 906* 168*  BUN 46* 46*  CREATININE 1.28* 1.11*  CALCIUM 8.7* 8.8*   PT/INR No results for input(s): LABPROT, INR in the last 72 hours. ABG  Recent Labs  06/26/16 1512  PHART 7.429  HCO3 21.5    Studies/Results: Ct Abdomen Pelvis Wo Contrast  Result Date: 06/26/2016 CLINICAL DATA:  Shortness of breath EXAM: CT CHEST, ABDOMEN AND PELVIS WITHOUT CONTRAST TECHNIQUE: Multidetector CT imaging of the chest, abdomen and pelvis was performed following the standard protocol without IV contrast. COMPARISON:  06/20/2016 chest CT FINDINGS: CT CHEST FINDINGS Cardiovascular: Normal heart size. No pericardial effusion. 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 Mild haziness around the upper descending segment is likely stable. Stable maximal diameter of 33 mm at this level. No intramural hematoma. Intermittently seen displaced  intimal calcification. Mediastinum/Nodes: Negative for adenopathy. Right upper extremity PICC with tip at the SVC level. Lungs/Pleura: Multi segment atelectasis, worse on the left. Small pleural effusions. There is patchy ground-glass airspace density in the bilateral lungs without gradient. Airways are clear and there is no septal thickening. Musculoskeletal: No acute or aggressive finding. CT ABDOMEN PELVIS FINDINGS Hepatobiliary: Hepatic steatosis.Cholecystectomy with normal common bile duct diameter. Pancreas: Unremarkable. Spleen: Unremarkable. 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 The larger measures up to 27 mm and is indeterminate by densitometry, but left adrenal mass has been noted since at least 2006 abdominal CT report, images not available. Left renal cyst. No hydronephrosis or urolithiasis. 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 Negative decompressed urinary bladder. Stomach/Bowel: There is diffuse small bowel distention and fluid filling with mildly hazy mesenteries. Similar features in the colon proximally and at the transverse segment. No pneumatosis or perforation is noted. Nasogastric tube tip is at the pylorus. Vascular/Lymphatic: There is intimal flap displacement from known dissection, morphology appearing similar to prior. No mass or adenopathy. Reproductive:Hysterectomy.  Unremarkable ovaries. Other: No ascites or pneumoperitoneum.  Anasarca. Musculoskeletal: No acute abnormalities. These results were called by telephone at the time of interpretation on 06/26/2016 at 2:05 pm to Dr. Ivin Poot , who verbally acknowledged these results. IMPRESSION: 1. Diffuse airspace disease. Pattern can be seen with noncardiogenic edema (ARDS), inflammatory pneumonitis, or atypical infection. Multi segment atelectasis at the bases and small pleural effusions. 2. Diffuse small bowel and colonic  distention with fluid levels as seen with  ileus. Question underlying bowel ischemia given the patient's known aortic dissection with proximal IMA occlusion and SMA flow via the narrow true lumen. 3. Incidental findings noted above. Electronically Signed   By: Monte Fantasia M.D.   On: 06/26/2016 14:05   Ct Chest Wo Contrast  Result Date: 06/26/2016 CLINICAL DATA:  Shortness of breath EXAM: CT CHEST, ABDOMEN AND PELVIS WITHOUT CONTRAST TECHNIQUE: Multidetector CT imaging of the chest, abdomen and pelvis was performed following the standard protocol without IV contrast. COMPARISON:  06/20/2016 chest CT FINDINGS: CT CHEST FINDINGS Cardiovascular: Normal heart size. No pericardial effusion. 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 Mild haziness around the upper descending segment is likely stable. Stable maximal diameter of 33 mm at this level. No intramural hematoma. Intermittently seen displaced intimal calcification. Mediastinum/Nodes: Negative for adenopathy. Right upper extremity PICC with tip at the SVC level. Lungs/Pleura: Multi segment atelectasis, worse on the left. Small pleural effusions. There is patchy ground-glass airspace density in the bilateral lungs without gradient. Airways are clear and there is no septal thickening. Musculoskeletal: No acute or aggressive finding. CT ABDOMEN PELVIS FINDINGS Hepatobiliary: Hepatic steatosis.Cholecystectomy with normal common bile duct diameter. Pancreas: Unremarkable. Spleen: Unremarkable. 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 The larger measures up to 27 mm and is indeterminate by densitometry, but left adrenal mass has been noted since at least 2006 abdominal CT report, images not available. Left renal cyst. No hydronephrosis or urolithiasis. 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 Negative decompressed urinary bladder. Stomach/Bowel:  There is diffuse small bowel distention and fluid filling with mildly hazy mesenteries. Similar features in the colon proximally and at the transverse segment. No pneumatosis or perforation is noted. Nasogastric tube tip is at the pylorus. Vascular/Lymphatic: There is intimal flap displacement from known dissection, morphology appearing similar to prior. No mass or adenopathy. Reproductive:Hysterectomy.  Unremarkable ovaries. Other: No ascites or pneumoperitoneum.  Anasarca. Musculoskeletal: No acute abnormalities. These results were called by telephone at the time of interpretation on 06/26/2016 at 2:05 pm to Dr. Ivin Poot , who verbally acknowledged these results. IMPRESSION: 1. Diffuse airspace disease. Pattern can be seen with noncardiogenic edema (ARDS), inflammatory pneumonitis, or atypical infection. Multi segment atelectasis at the bases and small pleural effusions. 2. Diffuse small bowel and colonic distention with fluid levels as seen with ileus. Question underlying bowel ischemia given the patient's known aortic dissection with proximal IMA occlusion and SMA flow via the narrow true lumen. 3. Incidental findings noted above. Electronically Signed   By: Monte Fantasia M.D.   On: 06/26/2016 14:05   Dg Chest Port 1 View  Result Date: 06/27/2016 CLINICAL DATA:  ARDS. EXAM: PORTABLE CHEST 1 VIEW COMPARISON:  Jun 26, 2016 FINDINGS: The right PICC line is stable. The NG tube terminates below today's study. Diffuse bilateral primarily interstitial opacities are mildly more homogeneous in less patchy in appearance. More focal opacity in the left base is stable. No other changes. IMPRESSION: 1. Diffuse bilateral primarily interstitial opacities suggesting edema remain. More focal opacity in the left base is stable to mildly improved. Electronically Signed   By: Dorise Bullion III M.D   On: 06/27/2016 07:21   Dg Chest Port 1 View  Result Date: 06/26/2016 CLINICAL DATA:  Shortness of breath. EXAM:  PORTABLE CHEST 1 VIEW COMPARISON:  Radiograph of Jun 25, 2016. FINDINGS: Stable cardiomegaly. Increased bilateral diffuse interstitial and basilar opacities  are noted concerning for worsening edema. No pneumothorax is noted. Mild left pleural effusion is noted with associated atelectasis or infiltrate. Right-sided PICC line is unchanged in position. Nasogastric tube is unchanged in position. Bony thorax is unremarkable. IMPRESSION: Increased bilateral diffuse interstitial densities consistent with edema. Mild left pleural effusion is noted with associated atelectasis or infiltrate. Electronically Signed   By: Marijo Conception, M.D.   On: 06/26/2016 07:40   Dg Abd Portable 1v  Result Date: 06/27/2016 CLINICAL DATA:  Ileus. EXAM: PORTABLE ABDOMEN - 1 VIEW COMPARISON:  Jun 26, 2016 FINDINGS: The NG tube terminates within the stomach. The bowel gas pattern is unremarkable on today's study. No other acute abnormalities. IMPRESSION: The NG tube terminates within the stomach. The bowel gas pattern is unremarkable. Electronically Signed   By: Dorise Bullion III M.D   On: 06/27/2016 07:19   Dg Abd Portable 1v  Result Date: 06/26/2016 CLINICAL DATA:  Shortness of breath, abdominal pain EXAM: PORTABLE ABDOMEN - 1 VIEW COMPARISON:  06/25/2016 FINDINGS: NG tube tip is in the distal stomach. Prior cholecystectomy. Nonobstructive bowel gas pattern. No free air organomegaly. IMPRESSION: NG tube tip in the distal stomach.  No acute findings. Electronically Signed   By: Rolm Baptise M.D.   On: 06/26/2016 07:36    Anti-infectives: Anti-infectives    Start     Dose/Rate Route Frequency Ordered Stop   06/26/16 0830  meropenem (MERREM) 1 g in sodium chloride 0.9 % 100 mL IVPB     1 g 200 mL/hr over 30 Minutes Intravenous Every 8 hours 06/26/16 0810     06/22/16 1800  clindamycin (CLEOCIN) IVPB 600 mg  Status:  Discontinued     600 mg 100 mL/hr over 30 Minutes Intravenous Every 8 hours 06/22/16 1723 06/26/16 0810    06/22/16 1000  azithromycin (ZITHROMAX) 250 mg in dextrose 5 % 125 mL IVPB     250 mg 125 mL/hr over 60 Minutes Intravenous Every 24 hours 06/22/16 0826     06/22/16 0900  levofloxacin (LEVAQUIN) IVPB 500 mg  Status:  Discontinued     500 mg 100 mL/hr over 60 Minutes Intravenous Every 24 hours 06/22/16 0759 06/22/16 0824      Assessment/Plan: s/p  Keep NGT  Try reglan.  If she has some bowel ischemia it may be helpful to have vascular surgery to consult.  LOS: 7 days   Kathryne Eriksson. Dahlia Bailiff, MD, FACS [phone] [facility] Surgery 06/27/2016

## 2016-06-27 NOTE — Progress Notes (Signed)
Patient ID: Haley Graham, female   DOB: 1955/04/12, 61 y.o.   MRN: [id number] TCTS DAILY ICU PROGRESS NOTE                   [address]            [place] [address]          [phone]       Total Length of Stay:  LOS: 7 days   Subjective: Little less  abdominal pin, ng tube still in   Objective: Vital signs in last 24 hours: Temp:  [97.5 F (36.4 C)-98.6 F (37 C)] 98.3 F (36.8 C) (05/26 0330) Pulse Rate:  [72-83] 73 (05/26 0900) Cardiac Rhythm: Normal sinus rhythm (05/26 0800) Resp:  [16-31] 23 (05/26 0900) BP: (94-160)/(43-87) 110/87 (05/26 0900) SpO2:  [91 %-100 %] 96 % (05/26 0900) Weight:  [192 lb 14.4 oz (87.5 kg)] 192 lb 14.4 oz (87.5 kg) (05/26 0330)  Filed Weights   06/25/16 0500 06/26/16 0500 06/27/16 0330  Weight: 189 lb 2.5 oz (85.8 kg) 193 lb 9 oz (87.8 kg) 192 lb 14.4 oz (87.5 kg)    Weight change: -10.6 oz (-0.3 kg)   Hemodynamic parameters for last 24 hours:    Intake/Output from previous day: 05/25 0701 - 05/26 0700 In: 3788.7 [P.O.:860; I.V.:2703.7; IV Piggyback:100] Out: 6295 [Urine:2795; Emesis/NG output:1500]  Intake/Output this shift: Total I/O In: 426.8 [I.V.:246.8; NG/GT:30; IV Piggyback:150] Out: 975 [Urine:975]  Current Meds: Scheduled Meds: . albuterol  2.5 mg Inhalation TID  . bisacodyl  10 mg Rectal q morning - 10a  . chlorhexidine  15 mL Mouth Rinse BID  . Chlorhexidine Gluconate Cloth  6 each Topical Daily  . citalopram  20 mg Oral Daily  . cloNIDine  0.2 mg Transdermal Weekly  . fentaNYL  50 mcg Transdermal Q72H  . furosemide  40 mg Intravenous Daily  . insulin aspart  0-24 Units Subcutaneous Q4H  . insulin glargine  30 Units Subcutaneous BID  . mouth rinse  15 mL Mouth Rinse q12n4p  . metoCLOPramide (REGLAN) injection  5 mg Intravenous Q8H  . metoprolol tartrate  10 mg Intravenous Q4H  . mometasone-formoterol  2 puff Inhalation BID  . nicotine  14 mg Transdermal Daily  . nitroGLYCERIN  0.4 mg  Transdermal Daily  . pantoprazole (PROTONIX) IV  40 mg Intravenous Q24H  . sodium chloride flush  10-40 mL Intracatheter Q12H   Continuous Infusions: . Marland KitchenTPN (CLINIMIX-E) Adult 50 mL/hr at 06/27/16 0900  . Marland KitchenTPN (CLINIMIX-E) Adult    . sodium chloride    . azithromycin 250 mg (06/27/16 0926)  . esmolol 299.837 mcg/kg/min (06/27/16 0900)  . meropenem (MERREM) IV Stopped (06/27/16 2841)   PRN Meds:.Place/Maintain arterial line **AND** sodium chloride, acetaminophen **OR** acetaminophen, albuterol, ALPRAZolam, hydrALAZINE, HYDROmorphone (DILAUDID) injection, ondansetron (ZOFRAN) IV, pneumococcal 23 valent vaccine, promethazine, simethicone, sodium chloride flush  General appearance: alert, cooperative and no distress Neurologic: intact Heart: regular rate and rhythm, S1, S2 normal, no murmur, click, rub or gallop Lungs: diminished breath sounds bibasilar Abdomen: softer then several days ago , less tenderness; no bowel sounds; no masses,  no organomegaly Extremities: edema palpable  pt and dp pulses  and some mottled skin changes feet  Lab Results: CBC: Recent Labs  06/26/16 0535 06/27/16 0340  WBC 25.4* 36.7*  HGB 11.8* 11.8*  HCT 36.6 35.3*  PLT 94* 103*   BMET:  Recent Labs  06/26/16 1715 06/27/16 0340  NA 126* 137  K 5.4* 4.0  CL 98* 106  CO2 20* 22  GLUCOSE 906* 168*  BUN 46* 46*  CREATININE 1.28* 1.11*  CALCIUM 8.7* 8.8*    CMET: Lab Results  Component Value Date   WBC 36.7 (H) 06/27/2016   HGB 11.8 (L) 06/27/2016   HCT 35.3 (L) 06/27/2016   PLT 103 (L) 06/27/2016   GLUCOSE 168 (H) 06/27/2016   TRIG 506 (H) 06/24/2016   ALT 125 (H) 06/27/2016   AST 55 (H) 06/27/2016   NA 137 06/27/2016   K 4.0 06/27/2016   CL 106 06/27/2016   CREATININE 1.11 (H) 06/27/2016   BUN 46 (H) 06/27/2016   CO2 22 06/27/2016      PT/INR: No results for input(s): LABPROT, INR in the last 72 hours. Radiology: Ct Abdomen Pelvis Wo Contrast  Result Date: 06/26/2016 CLINICAL  DATA:  Shortness of breath EXAM: CT CHEST, ABDOMEN AND PELVIS WITHOUT CONTRAST TECHNIQUE: Multidetector CT imaging of the chest, abdomen and pelvis was performed following the standard protocol without IV contrast. COMPARISON:  06/20/2016 chest CT FINDINGS: CT CHEST FINDINGS Cardiovascular: Normal heart size. No pericardial effusion. 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 Mild haziness around the upper descending segment is likely stable. Stable maximal diameter of 33 mm at this level. No intramural hematoma. Intermittently seen displaced intimal calcification. Mediastinum/Nodes: Negative for adenopathy. Right upper extremity PICC with tip at the SVC level. Lungs/Pleura: Multi segment atelectasis, worse on the left. Small pleural effusions. There is patchy ground-glass airspace density in the bilateral lungs without gradient. Airways are clear and there is no septal thickening. Musculoskeletal: No acute or aggressive finding. CT ABDOMEN PELVIS FINDINGS Hepatobiliary: Hepatic steatosis.Cholecystectomy with normal common bile duct diameter. Pancreas: Unremarkable. Spleen: Unremarkable. 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 The larger measures up to 27 mm and is indeterminate by densitometry, but left adrenal mass has been noted since at least 2006 abdominal CT report, images not available. Left renal cyst. No hydronephrosis or urolithiasis. 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 Negative decompressed urinary bladder. Stomach/Bowel: There is diffuse small bowel distention and fluid filling with mildly hazy mesenteries. Similar features in the colon proximally and at the transverse segment. No pneumatosis or perforation is noted. Nasogastric tube tip is at the pylorus. Vascular/Lymphatic: There is intimal flap displacement from known dissection, morphology appearing similar to prior. No mass  or adenopathy. Reproductive:Hysterectomy.  Unremarkable ovaries. Other: No ascites or pneumoperitoneum.  Anasarca. Musculoskeletal: No acute abnormalities. These results were called by telephone at the time of interpretation on 06/26/2016 at 2:05 pm to Dr. Ivin Poot , who verbally acknowledged these results. IMPRESSION: 1. Diffuse airspace disease. Pattern can be seen with noncardiogenic edema (ARDS), inflammatory pneumonitis, or atypical infection. Multi segment atelectasis at the bases and small pleural effusions. 2. Diffuse small bowel and colonic distention with fluid levels as seen with ileus. Question underlying bowel ischemia given the patient's known aortic dissection with proximal IMA occlusion and SMA flow via the narrow true lumen. 3. Incidental findings noted above. Electronically Signed   By: Monte Fantasia M.D.   On: 06/26/2016 14:05   Ct Chest Wo Contrast  Result Date: 06/26/2016 CLINICAL DATA:  Shortness of breath EXAM: CT CHEST, ABDOMEN AND PELVIS WITHOUT CONTRAST TECHNIQUE: Multidetector CT imaging of the chest, abdomen and pelvis was performed following the standard protocol without IV contrast. COMPARISON:  06/20/2016 chest CT FINDINGS:  CT CHEST FINDINGS Cardiovascular: Normal heart size. No pericardial effusion. 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 Mild haziness around the upper descending segment is likely stable. Stable maximal diameter of 33 mm at this level. No intramural hematoma. Intermittently seen displaced intimal calcification. Mediastinum/Nodes: Negative for adenopathy. Right upper extremity PICC with tip at the SVC level. Lungs/Pleura: Multi segment atelectasis, worse on the left. Small pleural effusions. There is patchy ground-glass airspace density in the bilateral lungs without gradient. Airways are clear and there is no septal thickening. Musculoskeletal: No acute or aggressive finding. CT ABDOMEN PELVIS FINDINGS Hepatobiliary: Hepatic  steatosis.Cholecystectomy with normal common bile duct diameter. Pancreas: Unremarkable. Spleen: Unremarkable. 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 The larger measures up to 27 mm and is indeterminate by densitometry, but left adrenal mass has been noted since at least 2006 abdominal CT report, images not available. Left renal cyst. No hydronephrosis or urolithiasis. 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 Negative decompressed urinary bladder. Stomach/Bowel: There is diffuse small bowel distention and fluid filling with mildly hazy mesenteries. Similar features in the colon proximally and at the transverse segment. No pneumatosis or perforation is noted. Nasogastric tube tip is at the pylorus. Vascular/Lymphatic: There is intimal flap displacement from known dissection, morphology appearing similar to prior. No mass or adenopathy. Reproductive:Hysterectomy.  Unremarkable ovaries. Other: No ascites or pneumoperitoneum.  Anasarca. Musculoskeletal: No acute abnormalities. These results were called by telephone at the time of interpretation on 06/26/2016 at 2:05 pm to Dr. Ivin Poot , who verbally acknowledged these results. IMPRESSION: 1. Diffuse airspace disease. Pattern can be seen with noncardiogenic edema (ARDS), inflammatory pneumonitis, or atypical infection. Multi segment atelectasis at the bases and small pleural effusions. 2. Diffuse small bowel and colonic distention with fluid levels as seen with ileus. Question underlying bowel ischemia given the patient's known aortic dissection with proximal IMA occlusion and SMA flow via the narrow true lumen. 3. Incidental findings noted above. Electronically Signed   By: Monte Fantasia M.D.   On: 06/26/2016 14:05   Dg Chest Port 1 View  Result Date: 06/27/2016 CLINICAL DATA:  ARDS. EXAM: PORTABLE CHEST 1 VIEW COMPARISON:  Jun 26, 2016 FINDINGS: The right  PICC line is stable. The NG tube terminates below today's study. Diffuse bilateral primarily interstitial opacities are mildly more homogeneous in less patchy in appearance. More focal opacity in the left base is stable. No other changes. IMPRESSION: 1. Diffuse bilateral primarily interstitial opacities suggesting edema remain. More focal opacity in the left base is stable to mildly improved. Electronically Signed   By: Dorise Bullion III M.D   On: 06/27/2016 07:21   Dg Abd Portable 1v  Result Date: 06/27/2016 CLINICAL DATA:  Ileus. EXAM: PORTABLE ABDOMEN - 1 VIEW COMPARISON:  Jun 26, 2016 FINDINGS: The NG tube terminates within the stomach. The bowel gas pattern is unremarkable on today's study. No other acute abnormalities. IMPRESSION: The NG tube terminates within the stomach. The bowel gas pattern is unremarkable. Electronically Signed   By: Dorise Bullion III M.D   On: 06/27/2016 07:19     Assessment/Plan: CCM involved in patient with multi system failure, cr / renal function improving, Progress leucocytosis  followed by vascular surgery since admission, discussed with Dr Early today , with rising wbc unclear if related to bowel or poss pulmonary infection will take calculated risk of giving iv contrast and get  follow up cta of chest abdomen pelvis    [name] 06/27/2016 9:43 AM

## 2016-06-28 ENCOUNTER — Inpatient Hospital Stay (HOSPITAL_COMMUNITY): Payer: BLUE CROSS/BLUE SHIELD

## 2016-06-28 LAB — COMPREHENSIVE METABOLIC PANEL
ALT: 95 U/L — ABNORMAL HIGH (ref 14–54)
AST: 56 U/L — ABNORMAL HIGH (ref 15–41)
Albumin: 1.9 g/dL — ABNORMAL LOW (ref 3.5–5.0)
Alkaline Phosphatase: 146 U/L — ABNORMAL HIGH (ref 38–126)
Anion gap: 8 (ref 5–15)
BUN: 55 mg/dL — ABNORMAL HIGH (ref 6–20)
CO2: 22 mmol/L (ref 22–32)
Calcium: 8.6 mg/dL — ABNORMAL LOW (ref 8.9–10.3)
Chloride: 109 mmol/L (ref 101–111)
Creatinine, Ser: 1.35 mg/dL — ABNORMAL HIGH (ref 0.44–1.00)
GFR calc Af Amer: 49 mL/min — ABNORMAL LOW (ref >60)
GFR calc non Af Amer: 42 mL/min — ABNORMAL LOW (ref >60)
Glucose, Bld: 169 mg/dL — ABNORMAL HIGH (ref 65–99)
Potassium: 3.4 mmol/L — ABNORMAL LOW (ref 3.5–5.1)
Sodium: 139 mmol/L (ref 135–145)
Total Bilirubin: 0.9 mg/dL (ref 0.3–1.2)
Total Protein: 5.6 g/dL — ABNORMAL LOW (ref 6.5–8.1)

## 2016-06-28 LAB — POCT I-STAT 3, ART BLOOD GAS (G3+)
Acid-base deficit: 3 mmol/L — ABNORMAL HIGH (ref 0.0–2.0)
Bicarbonate: 20.7 mmol/L (ref 20.0–28.0)
O2 Saturation: 93 %
Patient temperature: 98.2
TCO2: 22 mmol/L (ref 0–100)
pCO2 arterial: 31.2 mmHg — ABNORMAL LOW (ref 32.0–48.0)
pH, Arterial: 7.43 (ref 7.350–7.450)
pO2, Arterial: 64 mmHg — ABNORMAL LOW (ref 83.0–108.0)

## 2016-06-28 LAB — CBC
HCT: 34.6 % — ABNORMAL LOW (ref 36.0–46.0)
Hemoglobin: 11.6 g/dL — ABNORMAL LOW (ref 12.0–15.0)
MCH: 33.2 pg (ref 26.0–34.0)
MCHC: 33.5 g/dL (ref 30.0–36.0)
MCV: 99.1 fL (ref 78.0–100.0)
Platelets: 138 10*3/uL — ABNORMAL LOW (ref 150–400)
RBC: 3.49 MIL/uL — ABNORMAL LOW (ref 3.87–5.11)
RDW: 13.9 % (ref 11.5–15.5)
WBC: 30.6 10*3/uL — ABNORMAL HIGH (ref 4.0–10.5)

## 2016-06-28 LAB — GLUCOSE, CAPILLARY
Glucose-Capillary: 153 mg/dL — ABNORMAL HIGH (ref 65–99)
Glucose-Capillary: 162 mg/dL — ABNORMAL HIGH (ref 65–99)
Glucose-Capillary: 164 mg/dL — ABNORMAL HIGH (ref 65–99)
Glucose-Capillary: 166 mg/dL — ABNORMAL HIGH (ref 65–99)
Glucose-Capillary: 204 mg/dL — ABNORMAL HIGH (ref 65–99)
Glucose-Capillary: 235 mg/dL — ABNORMAL HIGH (ref 65–99)

## 2016-06-28 LAB — LACTIC ACID, PLASMA: Lactic Acid, Venous: 1.5 mmol/L (ref 0.5–1.9)

## 2016-06-28 MED ORDER — POTASSIUM CHLORIDE 10 MEQ/50ML IV SOLN
10.0000 meq | INTRAVENOUS | Status: AC
  Administered 2016-06-28 (×4): 10 meq via INTRAVENOUS
  Filled 2016-06-28 (×4): qty 50

## 2016-06-28 MED ORDER — CLONIDINE HCL 0.3 MG/24HR TD PTWK
0.3000 mg | MEDICATED_PATCH | TRANSDERMAL | Status: DC
  Administered 2016-06-28 – 2016-07-19 (×4): 0.3 mg via TRANSDERMAL
  Filled 2016-06-28 (×5): qty 1

## 2016-06-28 MED ORDER — CLONIDINE HCL 0.3 MG/24HR TD PTWK: 0.3000 mg | MEDICATED_PATCH | TRANSDERMAL | Status: DC

## 2016-06-28 MED ORDER — M.V.I. ADULT IV INJ
INTRAVENOUS | Status: AC
  Administered 2016-06-28: 18:00:00 via INTRAVENOUS
  Filled 2016-06-28: qty 1992

## 2016-06-28 MED ORDER — FUROSEMIDE 10 MG/ML IJ SOLN
40.0000 mg | Freq: Once | INTRAMUSCULAR | Status: AC
  Administered 2016-06-28: 40 mg via INTRAVENOUS

## 2016-06-28 NOTE — Progress Notes (Signed)
  PHARMACY - ADULT TOTAL PARENTERAL NUTRITION CONSULT NOTE   Pharmacy Consult for TPN Indication: prolonged NPO status/high NG output   Patient Measurements: Height: '5\' 3"'$  (160 cm) Weight: 188 lb 15 oz (85.7 kg) IBW/kg (Calculated) : 52.4 TPN AdjBW (KG): 60.3 Body mass index is 33.47 kg/m. 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 Patient had a significant hx of N/V for 2-4 weeks prior to admission. Weight appears to have been maintained. Patient had a NG tube placed 5/20 which has has significant output with 1-1.5 L a day.   GI: NG tube in place for drainage (1 L out yesterday which is trending down) Lower abdominal pain improved. Prealbumin-14.8 and albumin down to 1.9. CVTS decreased TPN on 5/25 due to ARDs picture, seems to be better now. Last BM was 5/19. Possible ileus with no bowel movements or gas? Increasing WBC count might be worrisome for C. Diff but no BMs yet. Phenergan Q 6 prn. Bisacodyl every morning Endo: Hx of DM. No insulin at home. CBGs now uncontrolled (70-230s) on Lantus and SSI. No hypoglycemic symptoms charted. May need to increase basal more but will wait with one CBG in the 70s yesterday. Insulin requirements in the past 24 hours: 20 units of aspart; 60 units of Lantus  Lytes: Na-139, K down to 3.4, CoCa-10.2, Mg and Phos ok on 5/26, replaced some Mg yesterday. Renal:AKI: Scr 1.35 (Baseline < 1). BUN up to 55. UOP good. I/Os are mostly neutral this admit. Pulm: 5 L Campbell; [facility] Cards: Stanford type B aortic dissection; BP WNL-high, HR 70s in NSR. Esmolol gtt , lopressor increased to 7.5 mg Q 4 , clonidine 0.2 mg patch  Lasix added 5/24. CTA planned for today.  Hepatobil:AST/ALT trending down 56/95. Alk phos slightly elevated, Tbili wnl. Triglycerides elevated 506, recheck on 5/28 Neuro:Citalopram; Hydromorphone prn, Fentanyl patch  ID: [id number] of Azith for possible RLL pneumonia.  Meropenem added 5/25. Clindamycin stopped. WBC elevated at 30.6 (peaked at 36.7) Afebrile. No growth on cultures. Repeating today.  Best Practices:PPI TPN Access:PICC line placed 5/20 TPN start date:5/22  Nutritional Goals (per RD recommendation on 5/25): KCal: 2000-2200 kcal/day  Protein: 110-120 gm/day  Goal TPN rate: Clinimix 5/15 E @ 83 ml/hr + lipid emulsion 20 ml/hr   Current Nutrition:  TPN  Plan:  Continue Clinimix E 5/15 at 83 ml/hr Hold IV lipid emulsions (Resume by 6/6), check Trig tomorrow This provides 100 g of protein and 1414 kCals per day meeting 91% of protein and 71% of kCal needs Continue MVI in TPN Continue TE in TPN every other day, next 5/28 Continue Lantus 30 units BID Continue resistant SSI and adjust as needed Monitor TPN labs F/U ability to transition to TFs  Give 35mq of KCl x 4 runs today  If bowel ischemia and ileus ruled out, consider placing Cortrak tube soon and trialing TFs  NElenor Quinones PharmD, BCPS Clinical Pharmacist Pager [phone]/27/2018 7:31 AM

## 2016-06-28 NOTE — Progress Notes (Signed)
Per Dr. Donnetta Hutching, pt can have ice chips.

## 2016-06-28 NOTE — Progress Notes (Signed)
Patient ID: SKYANN GANIM, female   DOB: 05-25-1955, 61 y.o.   MRN: [id number] TCTS DAILY ICU PROGRESS NOTE                   [address]            [place] [address]          [phone]       Total Length of Stay:  LOS: 8 days   Subjective:  More confused this am,  400 from ng past 12 hours respiratory increased   Objective: Vital signs in last 24 hours: Temp:  [97.3 F (36.3 C)-98.8 F (37.1 C)] 97.4 F (36.3 C) (05/27 0907) Pulse Rate:  [70-87] 72 (05/27 0845) Cardiac Rhythm: Normal sinus rhythm (05/27 0800) Resp:  [18-28] 25 (05/27 0845) BP: (95-167)/(37-99) 140/53 (05/27 0845) SpO2:  [89 %-100 %] 92 % (05/27 0845) Weight:  [188 lb 15 oz (85.7 kg)] 188 lb 15 oz (85.7 kg) (05/27 0600)  Filed Weights   06/26/16 0500 06/27/16 0330 06/28/16 0600  Weight: 193 lb 9 oz (87.8 kg) 192 lb 14.4 oz (87.5 kg) 188 lb 15 oz (85.7 kg)    Weight change: -3 lb 15.5 oz (-1.8 kg)   Hemodynamic parameters for last 24 hours:    Intake/Output from previous day: 05/26 0701 - 05/27 0700 In: 9735 [I.V.:3155; NG/GT:120; IV Piggyback:475] Out: 5000 [Urine:4000; Emesis/NG output:1000]  Intake/Output this shift: Total I/O In: 278.1 [I.V.:148.1; NG/GT:30; IV Piggyback:100] Out: 150 [Urine:150]  Current Meds: Scheduled Meds: . albuterol  2.5 mg Inhalation TID  . arformoterol  15 mcg Nebulization BID  . bisacodyl  10 mg Rectal q morning - 10a  . budesonide (PULMICORT) nebulizer solution  0.5 mg Nebulization BID  . chlorhexidine  15 mL Mouth Rinse BID  . Chlorhexidine Gluconate Cloth  6 each Topical Daily  . cloNIDine  0.2 mg Transdermal Weekly  . fentaNYL  50 mcg Transdermal Q72H  . furosemide  40 mg Intravenous Daily  . insulin aspart  0-24 Units Subcutaneous Q4H  . insulin glargine  30 Units Subcutaneous BID  . mouth rinse  15 mL Mouth Rinse q12n4p  . metoCLOPramide (REGLAN) injection  5 mg Intravenous Q8H  . metoprolol tartrate  10 mg Intravenous Q4H  .  nitroGLYCERIN  0.4 mg Transdermal Daily  . pantoprazole (PROTONIX) IV  40 mg Intravenous Q24H  . sodium chloride flush  10-40 mL Intracatheter Q12H   Continuous Infusions: . Marland KitchenTPN (CLINIMIX-E) Adult 83 mL/hr at 06/28/16 0800  . Marland KitchenTPN (CLINIMIX-E) Adult    . sodium chloride    . esmolol 275 mcg/kg/min (06/28/16 0916)  . meropenem (MERREM) IV Stopped (06/28/16 0825)  . potassium chloride Stopped (06/28/16 0914)   PRN Meds:.Place/Maintain arterial line **AND** sodium chloride, acetaminophen **OR** acetaminophen, albuterol, hydrALAZINE, HYDROmorphone (DILAUDID) injection, LORazepam, ondansetron (ZOFRAN) IV, pneumococcal 23 valent vaccine, promethazine, sodium chloride flush  General appearance: more confused, not sure where she is, says abdomen is better after she had ice chips, but had not had any ice chips yet  Neurologic: intact Heart: regular rate and rhythm, S1, S2 normal, no murmur, click, rub or gallop Lungs: diminished breath sounds bibasilar Abdomen: softer then several days ago , less tenderness; no bowel sounds; no masses,  no organomegaly Extremities: edema palpable  pt and dp pulses  Unchanged   Lab Results: CBC:  Recent Labs  06/27/16 0340 06/28/16 0347  WBC 36.7* 30.6*  HGB 11.8* 11.6*  HCT 35.3* 34.6*  PLT 103* 138*   BMET:   Recent Labs  06/27/16 0340 06/28/16 0347  NA 137 139  K 4.0 3.4*  CL 106 109  CO2 22 22  GLUCOSE 168* 169*  BUN 46* 55*  CREATININE 1.11* 1.35*  CALCIUM 8.8* 8.6*    CMET: Lab Results  Component Value Date   WBC 30.6 (H) 06/28/2016   HGB 11.6 (L) 06/28/2016   HCT 34.6 (L) 06/28/2016   PLT 138 (L) 06/28/2016   GLUCOSE 169 (H) 06/28/2016   TRIG 506 (H) 06/24/2016   ALT 95 (H) 06/28/2016   AST 56 (H) 06/28/2016   NA 139 06/28/2016   K 3.4 (L) 06/28/2016   CL 109 06/28/2016   CREATININE 1.35 (H) 06/28/2016   BUN 55 (H) 06/28/2016   CO2 22 06/28/2016      PT/INR: No results for input(s): LABPROT, INR in the last 72  hours. Radiology: Dg Chest Port 1 View  Result Date: 06/28/2016 CLINICAL DATA:  Aortic dissection. EXAM: PORTABLE CHEST 1 VIEW COMPARISON:  06/27/2016. FINDINGS: The heart is enlarged. There is moderate vascular congestion. Prominence of the aortic contour reflecting the type B dissection. LEFT lower lobe atelectasis with effusion. IMPRESSION: Cardiomegaly. Aortic dissection. Vascular congestion. LEFT lower lobe atelectasis with effusion. Electronically Signed   By: Staci Righter M.D.   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 Multiplanar reconstructed images and MIPs were obtained and reviewed to evaluate the vascular anatomy. CONTRAST:  100 cc Isovue 370 COMPARISON:  CT 06/20/2016 FINDINGS: CTA CHEST FINDINGS Cardiovascular: Heart: Heart size unchanged. No pericardial fluid/ thickening. No significant coronary calcifications. Aorta: Re- demonstration of type B dissection with the entry tear appearing to originate just beyond the origin of the left subclavian artery. Branch vessels remain patent without extension of the dissection flap into the branch vessels. Caliber and contour of the ascending aorta unremarkable without evidence of retrograde extension. Greatest diameter of the ascending aorta 2.9 cm. Inflammatory changes surrounding the distal aortic arch in the proximal descending aorta. Greatest diameter of the distal aortic arch measures approximately 3.4 cm. Greatest diameter on the comparison CT approximately 3.0 cm. No aneurysm of the descending thoracic aorta with the greatest diameter of the false lumen measuring 2.7 cm. True lumen is compressed. There is a  fenestration in the distal true lumen at the level of the diaphragm just above the aortic hiatus. Pulmonary arteries: No lobar, segmental, or proximal subsegmental filling defects. Mediastinum/Nodes: No mediastinal hemorrhage. Mediastinal lymph nodes are present. Gastric tube within the esophagus. Lungs/Pleura: Ground-glass opacities developing through the the bilateral lungs. Developing interlobular septal thickening. Atelectasis of the medial segment left lower lobe. Small low-density left pleural effusion trace right-sided pleural effusion and associated atelectasis. No pneumothorax. Right upper extremity PICC appears to terminate superior vena cava. Review of the MIP images confirms the above findings. CTA ABDOMEN AND PELVIS FINDINGS VASCULAR Aorta: Re- demonstration of dissection flap of the abdominal aorta. No aneurysm.  No periaortic fluid of the abdomen. The true lumen is compressed throughout the abdomen. True lumen contributes to celiac artery origin, superior mesenteric artery origin, and also continues across the origin of the inferior mesenteric artery. The false lumen contributes to perfusion of  the right renal artery. The lateral margin of the dissection flap involves the origin of 2 left renal arteries both superior and inferior. True lumen is decompressed in the inferior aorta extending into the bilateral iliac arteries. The bilateral common iliac arteries appear perfused from the false lumen bilaterally. False lumen extends in the bilateral external iliac artery. Likely re- entry tear of distal external iliac arteries bilaterally, on the right image 173, on the left image 171. Dissection flap extends into the bilateral hypogastric arteries which are partially patent with decreased flow. Celiac: Origin of the celiac artery originates from the true lumen which is decompressed. Celiac artery remains patent at this time including the branch vessels. Typical branch pattern of splenic artery, left  gastric artery, common hepatic artery. SMA: Superior mesenteric artery origin originates from the true lumen which is compressed. SMA remains patent at this time. Renals: Right renal artery originates from the false lumen. Right renal artery is perfusing at this time with uniform perfusion of the right kidney. There are 2 left renal arteries, superior and inferior. Both arteries originate near the margin of the dissection flap. The superior left renal artery is partially filling, at the in flexion point of the dissection flap, perfusing superior kidney. The inferior left renal artery appears thrombosed, new from the comparison with enlarging left renal infarction, predominantly of the lower pole. IMA: Origin of the inferior mesenteric artery is thrombosed given the collapsed true lumen at this level. There is re- constitution of the distal inferior mesenteric artery via collateral flow. Right lower extremity: False lumen perfuses the right common iliac artery with collapse of the true lumen. Dissection flap extends into the hypogastric artery, with the distal pelvic branch is partially opacifying. External iliac artery is perfusing from the false lumen, with unremarkable appearance of the distal external iliac artery, common femoral artery, profunda femoris, SFA. There is the appearance of a re- entry tear in the mid right external iliac artery. Left lower extremity: False lumen perfuses the left common iliac artery with collapse of the true lumen. Dissection extends into the hypogastric artery which is partially perfused with opacification of pelvic vessels. Distal external iliac artery an the common femoral artery unremarkable, with patent proximal femoral vessels. There is the appearance of a re- entry tear in the mid left external iliac artery. Veins: Unremarkable appearance of the venous system. Review of the MIP images confirms the above findings. NON-VASCULAR Hepatobiliary: Unremarkable appearance of the  liver. Cholecystectomy Pancreas: Unremarkable appearance of the pancreas. No pericholecystic fluid or inflammatory changes. Unremarkable ductal system. Spleen: Unremarkable. Adrenals/Urinary Tract: Right adrenal gland unremarkable. Nodule of the left adrenal gland which measures 2.7 cm. Right: Right-sided kidney perfuses uniformly with no hydronephrosis. Left: Progressive left renal infarct with small portion of the superior kidney perfusing. The remainder of the left kidney is hypoperfused, progressed from the comparison. No hydronephrosis. Urinary catheter within the urinary bladder. Stomach/Bowel: Unremarkable appearance of stomach with gastric tube in place. Small bowel borderline dilated and fluid-filled. No transition point. The wall of the small bowel relatively uniformly thin and enhancing, although the study is timed for the arterial phase. No focal wall thickening. No abnormally distended colon. No transition point. Fluid filled colon. No focal wall thickening or pericolonic inflammatory changes. Lymphatic: Multiple lymph nodes in the para-aortic nodal station, none of which are enlarged. Mesenteric: No free fluid or air. No adenopathy. Reproductive: Hysterectomy Other: No hernia. Musculoskeletal: No displaced fracture. Degenerative changes of the spine. 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 Only 1 possible fenestration is identified, in the distal thoracic aorta above the aortic hiatus. There is enlarging diameter of the distal aortic arch with associated inflammatory changes, measuring approximately 3.5 on today's study compared to approximately 3.1 cm previously. The appearance is concerning for progression/expansion of intramural hematoma. Progressing left renal infarction, with near complete thrombosis of superior and inferior renal arteries, both of which originate at the margin of the dissection flap. Bilateral  common iliac arteries appear to be perfused from the false lumen, with complete collapse of true lumen proximally. There does appear to be re- entry to the true lumen in the mid external iliac artery bilaterally, as there is unremarkable appearance of the proximal femoral vasculature including bilateral common iliac arteries. The 3 mesenteric vessels originate from the small true lumen, which is progressively compressed. Celiac artery and superior mesenteric artery remain patent, with occlusion at the origin of the inferior mesenteric artery. Three vessel arch, with all 3 branches remain patent. No evidence of extension of the dissection flap into the branch vessels. These preliminary results were discussed by telephone at the time of interpretation on 06/27/2016 at 12:41 pm with Dr. Curt Jews. Borderline dilated small bowel without transition point or obstruction. Findings may represent ileus and/or nonspecific enteritis. No focal wall thickening to suggest early ischemia, although the timing of this CTA is specific for arterial evaluation and not the bowel tissues. If ongoing concern for bowel ischemia, would repeat abd/pelvis contrast enhanced-CT portal venous phase. Similar appearance of mixed geographic ground-glass opacities of the bilateral lungs with mild interlobular septal thickening. Again, differential diagnosis includes pulmonary edema, ARDS, atypical infection. Signed, Dulcy Fanny. Earleen Newport, DO [facility] Radiology Electronically Signed   By: Corrie Mckusick D.O.   On: 06/27/2016 13:18     Assessment/Plan: Remains critically ill, wbc elevated but decreased compared to yesterday  Bun/cr ratio increased slightly Cr to 1.35 from 1.1 yesterday  lfts elevated  Continue supportive care , monitor increasing respiratory distress, cr tical car medicine involved with care    [name] 06/28/2016 9:17 AM Patient ID: Pablo Ledger, female   DOB:  03/31/1955, 61 y.o.   MRN: [id number]

## 2016-06-28 NOTE — Progress Notes (Signed)
Dr. Servando Snare and Dr. Tamala Julian paged regarding pt's increased work of breathing with pt c/o of "difficult in breathing" She remains on 5L O2, RR 20-25, O2 95%, mild dyspnea at rest, using abdominal muscles. Upper lobes clear, lower lobes diminished/crackles. 40 IV lasix, ABG, and blood cultures ordered. Will follow results.

## 2016-06-28 NOTE — Progress Notes (Signed)
Subjective/Chief Complaint: Pt passed gas yesterday  No BM  Family thought she may be a little more confused on reglan  She has not been out of bed since Tuesday    Objective: Vital signs in last 24 hours: Temp:  [97.3 F (36.3 C)-98.8 F (37.1 C)] 97.7 F (36.5 C) (05/27 0300) Pulse Rate:  [70-87] 73 (05/27 0800) Resp:  [18-28] 24 (05/27 0800) BP: (95-167)/(37-99) 128/55 (05/27 0800) SpO2:  [89 %-100 %] 94 % (05/27 0800) Weight:  [85.7 kg (188 lb 15 oz)] 85.7 kg (188 lb 15 oz) (05/27 0600) Last BM Date: 06/20/16  Intake/Output from previous day: 05/26 0701 - 05/27 0700 In: 3750 [I.V.:3155; NG/GT:120; IV Piggyback:475] Out: 5000 [Urine:4000; Emesis/NG output:1000] Intake/Output this shift: Total I/O In: 183 [I.V.:83; IV Piggyback:100] Out: -   GI: soft NT ND rare BS   Lab Results:   Recent Labs  06/27/16 0340 06/28/16 0347  WBC 36.7* 30.6*  HGB 11.8* 11.6*  HCT 35.3* 34.6*  PLT 103* 138*   BMET  Recent Labs  06/27/16 0340 06/28/16 0347  NA 137 139  K 4.0 3.4*  CL 106 109  CO2 22 22  GLUCOSE 168* 169*  BUN 46* 55*  CREATININE 1.11* 1.35*  CALCIUM 8.8* 8.6*   PT/INR No results for input(s): LABPROT, INR in the last 72 hours. ABG  Recent Labs  06/26/16 1512  PHART 7.429  HCO3 21.5    Studies/Results: Ct Abdomen Pelvis Wo Contrast  Result Date: 06/26/2016 CLINICAL DATA:  Shortness of breath EXAM: CT CHEST, ABDOMEN AND PELVIS WITHOUT CONTRAST TECHNIQUE: Multidetector CT imaging of the chest, abdomen and pelvis was performed following the standard protocol without IV contrast. COMPARISON:  06/20/2016 chest CT FINDINGS: CT CHEST FINDINGS Cardiovascular: Normal heart size. No pericardial effusion. 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 Mild haziness around the upper descending segment is likely stable. Stable maximal diameter of 33 mm at this level. No intramural hematoma. Intermittently seen displaced intimal  calcification. Mediastinum/Nodes: Negative for adenopathy. Right upper extremity PICC with tip at the SVC level. Lungs/Pleura: Multi segment atelectasis, worse on the left. Small pleural effusions. There is patchy ground-glass airspace density in the bilateral lungs without gradient. Airways are clear and there is no septal thickening. Musculoskeletal: No acute or aggressive finding. CT ABDOMEN PELVIS FINDINGS Hepatobiliary: Hepatic steatosis.Cholecystectomy with normal common bile duct diameter. Pancreas: Unremarkable. Spleen: Unremarkable. 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 The larger measures up to 27 mm and is indeterminate by densitometry, but left adrenal mass has been noted since at least 2006 abdominal CT report, images not available. Left renal cyst. No hydronephrosis or urolithiasis. 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 Negative decompressed urinary bladder. Stomach/Bowel: There is diffuse small bowel distention and fluid filling with mildly hazy mesenteries. Similar features in the colon proximally and at the transverse segment. No pneumatosis or perforation is noted. Nasogastric tube tip is at the pylorus. Vascular/Lymphatic: There is intimal flap displacement from known dissection, morphology appearing similar to prior. No mass or adenopathy. Reproductive:Hysterectomy.  Unremarkable ovaries. Other: No ascites or pneumoperitoneum.  Anasarca. Musculoskeletal: No acute abnormalities. These results were called by telephone at the time of interpretation on 06/26/2016 at 2:05 pm to Dr. Ivin Poot , who verbally acknowledged these results. IMPRESSION: 1. Diffuse airspace disease. Pattern can be seen with noncardiogenic edema (ARDS), inflammatory pneumonitis, or atypical infection. Multi segment  atelectasis at the bases and small pleural effusions. 2. Diffuse small bowel and colonic distention with  fluid levels as seen with ileus. Question underlying bowel ischemia given the patient's known aortic dissection with proximal IMA occlusion and SMA flow via the narrow true lumen. 3. Incidental findings noted above. Electronically Signed   By: Monte Fantasia M.D.   On: 06/26/2016 14:05   Ct Chest Wo Contrast  Result Date: 06/26/2016 CLINICAL DATA:  Shortness of breath EXAM: CT CHEST, ABDOMEN AND PELVIS WITHOUT CONTRAST TECHNIQUE: Multidetector CT imaging of the chest, abdomen and pelvis was performed following the standard protocol without IV contrast. COMPARISON:  06/20/2016 chest CT FINDINGS: CT CHEST FINDINGS Cardiovascular: Normal heart size. No pericardial effusion. 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 Mild haziness around the upper descending segment is likely stable. Stable maximal diameter of 33 mm at this level. No intramural hematoma. Intermittently seen displaced intimal calcification. Mediastinum/Nodes: Negative for adenopathy. Right upper extremity PICC with tip at the SVC level. Lungs/Pleura: Multi segment atelectasis, worse on the left. Small pleural effusions. There is patchy ground-glass airspace density in the bilateral lungs without gradient. Airways are clear and there is no septal thickening. Musculoskeletal: No acute or aggressive finding. CT ABDOMEN PELVIS FINDINGS Hepatobiliary: Hepatic steatosis.Cholecystectomy with normal common bile duct diameter. Pancreas: Unremarkable. Spleen: Unremarkable. 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 The larger measures up to 27 mm and is indeterminate by densitometry, but left adrenal mass has been noted since at least 2006 abdominal CT report, images not available. Left renal cyst. No hydronephrosis or urolithiasis. 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 Negative decompressed urinary bladder. Stomach/Bowel: There is  diffuse small bowel distention and fluid filling with mildly hazy mesenteries. Similar features in the colon proximally and at the transverse segment. No pneumatosis or perforation is noted. Nasogastric tube tip is at the pylorus. Vascular/Lymphatic: There is intimal flap displacement from known dissection, morphology appearing similar to prior. No mass or adenopathy. Reproductive:Hysterectomy.  Unremarkable ovaries. Other: No ascites or pneumoperitoneum.  Anasarca. Musculoskeletal: No acute abnormalities. These results were called by telephone at the time of interpretation on 06/26/2016 at 2:05 pm to Dr. Ivin Poot , who verbally acknowledged these results. IMPRESSION: 1. Diffuse airspace disease. Pattern can be seen with noncardiogenic edema (ARDS), inflammatory pneumonitis, or atypical infection. Multi segment atelectasis at the bases and small pleural effusions. 2. Diffuse small bowel and colonic distention with fluid levels as seen with ileus. Question underlying bowel ischemia given the patient's known aortic dissection with proximal IMA occlusion and SMA flow via the narrow true lumen. 3. Incidental findings noted above. Electronically Signed   By: Monte Fantasia M.D.   On: 06/26/2016 14:05   Dg Chest Port 1 View  Result Date: 06/28/2016 CLINICAL DATA:  Aortic dissection. EXAM: PORTABLE CHEST 1 VIEW COMPARISON:  06/27/2016. FINDINGS: The heart is enlarged. There is moderate vascular congestion. Prominence of the aortic contour reflecting the type B dissection. LEFT lower lobe atelectasis with effusion. IMPRESSION: Cardiomegaly. Aortic dissection. Vascular congestion. LEFT lower lobe atelectasis with effusion. Electronically Signed   By: Staci Righter M.D.   On: 06/28/2016 07:13   Dg Chest Port 1 View  Result Date: 06/27/2016 CLINICAL DATA:  ARDS. EXAM: PORTABLE CHEST 1 VIEW COMPARISON:  Jun 26, 2016 FINDINGS: The right PICC line is stable. The NG tube terminates below today's study. Diffuse  bilateral primarily interstitial  opacities are mildly more homogeneous in less patchy in appearance. More focal opacity in the left base is stable. No other changes. IMPRESSION: 1. Diffuse bilateral primarily interstitial opacities suggesting edema remain. More focal opacity in the left base is stable to mildly improved. Electronically Signed   By: Dorise Bullion III M.D   On: 06/27/2016 07:21   Dg Abd Portable 1v  Result Date: 06/27/2016 CLINICAL DATA:  Ileus. EXAM: PORTABLE ABDOMEN - 1 VIEW COMPARISON:  Jun 26, 2016 FINDINGS: The NG tube terminates within the stomach. The bowel gas pattern is unremarkable on today's study. No other acute abnormalities. IMPRESSION: The NG tube terminates within the stomach. The bowel gas pattern is unremarkable. Electronically Signed   By: Dorise Bullion III M.D   On: 06/27/2016 07:19   Ct Angio Chest/abd/pel For Dissection W And/or W/wo  Result Date: 06/27/2016 CLINICAL DATA:  61 year old female with a history of type B dissection EXAM: CT ANGIOGRAPHY CHEST, ABDOMEN AND PELVIS TECHNIQUE: Multidetector CT imaging through the chest, abdomen and pelvis was performed using the standard protocol during bolus administration of intravenous contrast. Multiplanar reconstructed images and MIPs were obtained and reviewed to evaluate the vascular anatomy. CONTRAST:  100 cc Isovue 370 COMPARISON:  CT 06/20/2016 FINDINGS: CTA CHEST FINDINGS Cardiovascular: Heart: Heart size unchanged. No pericardial fluid/ thickening. No significant coronary calcifications. Aorta: Re- demonstration of type B dissection with the entry tear appearing to originate just beyond the origin of the left subclavian artery. Branch vessels remain patent without extension of the dissection flap into the branch vessels. Caliber and contour of the ascending aorta unremarkable without evidence of retrograde extension. Greatest diameter of the ascending aorta 2.9 cm. Inflammatory changes surrounding the distal aortic  arch in the proximal descending aorta. Greatest diameter of the distal aortic arch measures approximately 3.4 cm. Greatest diameter on the comparison CT approximately 3.0 cm. No aneurysm of the descending thoracic aorta with the greatest diameter of the false lumen measuring 2.7 cm. True lumen is compressed. There is a fenestration in the distal true lumen at the level of the diaphragm just above the aortic hiatus. Pulmonary arteries: No lobar, segmental, or proximal subsegmental filling defects. Mediastinum/Nodes: No mediastinal hemorrhage. Mediastinal lymph nodes are present. Gastric tube within the esophagus. Lungs/Pleura: Ground-glass opacities developing through the the bilateral lungs. Developing interlobular septal thickening. Atelectasis of the medial segment left lower lobe. Small low-density left pleural effusion trace right-sided pleural effusion and associated atelectasis. No pneumothorax. Right upper extremity PICC appears to terminate superior vena cava. Review of the MIP images confirms the above findings. CTA ABDOMEN AND PELVIS FINDINGS VASCULAR Aorta: Re- demonstration of dissection flap of the abdominal aorta. No aneurysm.  No periaortic fluid of the abdomen. The true lumen is compressed throughout the abdomen. True lumen contributes to celiac artery origin, superior mesenteric artery origin, and also continues across the origin of the inferior mesenteric artery. The false lumen contributes to perfusion of the right renal artery. The lateral margin of the dissection flap involves the origin of 2 left renal arteries both superior and inferior. True lumen is decompressed in the inferior aorta extending into the bilateral iliac arteries. The bilateral common iliac arteries appear perfused from the false lumen bilaterally. False lumen extends in the bilateral external iliac artery. Likely re- entry tear of distal external iliac arteries bilaterally, on the right image 173, on the left image 171.  Dissection flap extends into the bilateral hypogastric arteries which are partially patent with decreased flow. Celiac: Origin of  the celiac artery originates from the true lumen which is decompressed. Celiac artery remains patent at this time including the branch vessels. Typical branch pattern of splenic artery, left gastric artery, common hepatic artery. SMA: Superior mesenteric artery origin originates from the true lumen which is compressed. SMA remains patent at this time. Renals: Right renal artery originates from the false lumen. Right renal artery is perfusing at this time with uniform perfusion of the right kidney. There are 2 left renal arteries, superior and inferior. Both arteries originate near the margin of the dissection flap. The superior left renal artery is partially filling, at the in flexion point of the dissection flap, perfusing superior kidney. The inferior left renal artery appears thrombosed, new from the comparison with enlarging left renal infarction, predominantly of the lower pole. IMA: Origin of the inferior mesenteric artery is thrombosed given the collapsed true lumen at this level. There is re- constitution of the distal inferior mesenteric artery via collateral flow. Right lower extremity: False lumen perfuses the right common iliac artery with collapse of the true lumen. Dissection flap extends into the hypogastric artery, with the distal pelvic branch is partially opacifying. External iliac artery is perfusing from the false lumen, with unremarkable appearance of the distal external iliac artery, common femoral artery, profunda femoris, SFA. There is the appearance of a re- entry tear in the mid right external iliac artery. Left lower extremity: False lumen perfuses the left common iliac artery with collapse of the true lumen. Dissection extends into the hypogastric artery which is partially perfused with opacification of pelvic vessels. Distal external iliac artery an the common  femoral artery unremarkable, with patent proximal femoral vessels. There is the appearance of a re- entry tear in the mid left external iliac artery. Veins: Unremarkable appearance of the venous system. Review of the MIP images confirms the above findings. NON-VASCULAR Hepatobiliary: Unremarkable appearance of the liver. Cholecystectomy Pancreas: Unremarkable appearance of the pancreas. No pericholecystic fluid or inflammatory changes. Unremarkable ductal system. Spleen: Unremarkable. Adrenals/Urinary Tract: Right adrenal gland unremarkable. Nodule of the left adrenal gland which measures 2.7 cm. Right: Right-sided kidney perfuses uniformly with no hydronephrosis. Left: Progressive left renal infarct with small portion of the superior kidney perfusing. The remainder of the left kidney is hypoperfused, progressed from the comparison. No hydronephrosis. Urinary catheter within the urinary bladder. Stomach/Bowel: Unremarkable appearance of stomach with gastric tube in place. Small bowel borderline dilated and fluid-filled. No transition point. The wall of the small bowel relatively uniformly thin and enhancing, although the study is timed for the arterial phase. No focal wall thickening. No abnormally distended colon. No transition point. Fluid filled colon. No focal wall thickening or pericolonic inflammatory changes. Lymphatic: Multiple lymph nodes in the para-aortic nodal station, none of which are enlarged. Mesenteric: No free fluid or air. No adenopathy. Reproductive: Hysterectomy Other: No hernia. Musculoskeletal: No displaced fracture. Degenerative changes of the spine. 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 Only 1 possible fenestration is identified, in the distal thoracic aorta above the aortic hiatus. There is enlarging diameter of the distal aortic arch with associated inflammatory changes, measuring approximately 3.5 on  today's study compared to approximately 3.1 cm previously. The appearance is concerning for progression/expansion of intramural hematoma. Progressing left renal infarction, with near complete thrombosis of superior and inferior renal arteries, both of which originate at the margin of the dissection flap. Bilateral common iliac arteries appear to be perfused from the  false lumen, with complete collapse of true lumen proximally. There does appear to be re- entry to the true lumen in the mid external iliac artery bilaterally, as there is unremarkable appearance of the proximal femoral vasculature including bilateral common iliac arteries. The 3 mesenteric vessels originate from the small true lumen, which is progressively compressed. Celiac artery and superior mesenteric artery remain patent, with occlusion at the origin of the inferior mesenteric artery. Three vessel arch, with all 3 branches remain patent. No evidence of extension of the dissection flap into the branch vessels. These preliminary results were discussed by telephone at the time of interpretation on 06/27/2016 at 12:41 pm with Dr. Curt Jews. Borderline dilated small bowel without transition point or obstruction. Findings may represent ileus and/or nonspecific enteritis. No focal wall thickening to suggest early ischemia, although the timing of this CTA is specific for arterial evaluation and not the bowel tissues. If ongoing concern for bowel ischemia, would repeat abd/pelvis contrast enhanced-CT portal venous phase. Similar appearance of mixed geographic ground-glass opacities of the bilateral lungs with mild interlobular septal thickening. Again, differential diagnosis includes pulmonary edema, ARDS, atypical infection. Signed, Dulcy Fanny. Earleen Newport, DO [facility] Electronically Signed   By: Corrie Mckusick D.O.   On: 06/27/2016 13:18    Anti-infectives: Anti-infectives    Start     Dose/Rate  Route Frequency Ordered Stop   06/26/16 0830  meropenem (MERREM) 1 g in sodium chloride 0.9 % 100 mL IVPB     1 g 200 mL/hr over 30 Minutes Intravenous Every 8 hours 06/26/16 0810     06/22/16 1800  clindamycin (CLEOCIN) IVPB 600 mg  Status:  Discontinued     600 mg 100 mL/hr over 30 Minutes Intravenous Every 8 hours 06/22/16 1723 06/26/16 0810   06/22/16 1000  azithromycin (ZITHROMAX) 250 mg in dextrose 5 % 125 mL IVPB  Status:  Discontinued     250 mg 125 mL/hr over 60 Minutes Intravenous Every 24 hours 06/22/16 0826 06/27/16 1019   06/22/16 0900  levofloxacin (LEVAQUIN) IVPB 500 mg  Status:  Discontinued     500 mg 100 mL/hr over 60 Minutes Intravenous Every 24 hours 06/22/16 0759 06/22/16 0824      Assessment/Plan: Patient Active Problem List   Diagnosis Date Noted  . 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 Change in stool 05/28/2016  . Gastroparesis 11/19/2011  . Nausea 11/19/2011  . Dysphagia 11/11/2011  . GERD (gastroesophageal reflux disease) 11/11/2011  Ileus   Some flatus   OOB if able   Cont TNA NGT    LOS: 8 days    Jeannine Pennisi A. 06/28/2016

## 2016-06-28 NOTE — Progress Notes (Signed)
Subjective: Interval History: none.. Confused last night. More oriented this morning.   Objective: Vital signs in last 24 hours: Temp:  [97.3 F (36.3 C)-98.8 F (37.1 C)] 97.4 F (36.3 C) (05/27 0907) Pulse Rate:  [70-87] 72 (05/27 0845) Resp:  [18-28] 25 (05/27 0845) BP: (95-167)/(37-99) 140/53 (05/27 0845) SpO2:  [89 %-100 %] 92 % (05/27 0845) Weight:  [188 lb 15 oz (85.7 kg)] 188 lb 15 oz (85.7 kg) (05/27 0600)  Intake/Output from previous day: 05/26 0701 - 05/27 0700 In: 3750 [I.V.:3155; NG/GT:120; IV Piggyback:475] Out: 5000 [Urine:4000; Emesis/NG output:1000] Intake/Output this shift: Total I/O In: 278.1 [I.V.:148.1; NG/GT:30; IV Piggyback:100] Out: 150 [Urine:150]  Receiving respiratory treatment but more of short of breath this morning. No change in abdominal exam. Mild diffuse tenderness. No tenderness in her abdomen with the shaking her hips.  Lab Results:  Recent Labs  06/27/16 0340 06/28/16 0347  WBC 36.7* 30.6*  HGB 11.8* 11.6*  HCT 35.3* 34.6*  PLT 103* 138*   BMET  Recent Labs  06/27/16 0340 06/28/16 0347  NA 137 139  K 4.0 3.4*  CL 106 109  CO2 22 22  GLUCOSE 168* 169*  BUN 46* 55*  CREATININE 1.11* 1.35*  CALCIUM 8.8* 8.6*    Studies/Results: Ct Abdomen Pelvis Wo Contrast  Result Date: 06/26/2016 CLINICAL DATA:  Shortness of breath EXAM: CT CHEST, ABDOMEN AND PELVIS WITHOUT CONTRAST TECHNIQUE: Multidetector CT imaging of the chest, abdomen and pelvis was performed following the standard protocol without IV contrast. COMPARISON:  06/20/2016 chest CT FINDINGS: CT CHEST FINDINGS Cardiovascular: Normal heart size. No pericardial effusion. 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 Mild haziness around the upper descending segment is likely stable. Stable maximal diameter of 33 mm at this level. No intramural hematoma. Intermittently seen displaced intimal calcification. Mediastinum/Nodes: Negative for adenopathy. Right upper  extremity PICC with tip at the SVC level. Lungs/Pleura: Multi segment atelectasis, worse on the left. Small pleural effusions. There is patchy ground-glass airspace density in the bilateral lungs without gradient. Airways are clear and there is no septal thickening. Musculoskeletal: No acute or aggressive finding. CT ABDOMEN PELVIS FINDINGS Hepatobiliary: Hepatic steatosis.Cholecystectomy with normal common bile duct diameter. Pancreas: Unremarkable. Spleen: Unremarkable. 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 The larger measures up to 27 mm and is indeterminate by densitometry, but left adrenal mass has been noted since at least 2006 abdominal CT report, images not available. Left renal cyst. No hydronephrosis or urolithiasis. 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 Negative decompressed urinary bladder. Stomach/Bowel: There is diffuse small bowel distention and fluid filling with mildly hazy mesenteries. Similar features in the colon proximally and at the transverse segment. No pneumatosis or perforation is noted. Nasogastric tube tip is at the pylorus. Vascular/Lymphatic: There is intimal flap displacement from known dissection, morphology appearing similar to prior. No mass or adenopathy. Reproductive:Hysterectomy.  Unremarkable ovaries. Other: No ascites or pneumoperitoneum.  Anasarca. Musculoskeletal: No acute abnormalities. These results were called by telephone at the time of interpretation on 06/26/2016 at 2:05 pm to Dr. Ivin Poot , who verbally acknowledged these results. IMPRESSION: 1. Diffuse airspace disease. Pattern can be seen with noncardiogenic edema (ARDS), inflammatory pneumonitis, or atypical infection. Multi segment atelectasis at the bases and small pleural effusions. 2. Diffuse small bowel and colonic distention with fluid levels as seen with ileus. Question underlying bowel ischemia  given the patient's known  aortic dissection with proximal IMA occlusion and SMA flow via the narrow true lumen. 3. Incidental findings noted above. Electronically Signed   By: Monte Fantasia M.D.   On: 06/26/2016 14:05   Ct Chest Wo Contrast  Result Date: 06/26/2016 CLINICAL DATA:  Shortness of breath EXAM: CT CHEST, ABDOMEN AND PELVIS WITHOUT CONTRAST TECHNIQUE: Multidetector CT imaging of the chest, abdomen and pelvis was performed following the standard protocol without IV contrast. COMPARISON:  06/20/2016 chest CT FINDINGS: CT CHEST FINDINGS Cardiovascular: Normal heart size. No pericardial effusion. 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 Mild haziness around the upper descending segment is likely stable. Stable maximal diameter of 33 mm at this level. No intramural hematoma. Intermittently seen displaced intimal calcification. Mediastinum/Nodes: Negative for adenopathy. Right upper extremity PICC with tip at the SVC level. Lungs/Pleura: Multi segment atelectasis, worse on the left. Small pleural effusions. There is patchy ground-glass airspace density in the bilateral lungs without gradient. Airways are clear and there is no septal thickening. Musculoskeletal: No acute or aggressive finding. CT ABDOMEN PELVIS FINDINGS Hepatobiliary: Hepatic steatosis.Cholecystectomy with normal common bile duct diameter. Pancreas: Unremarkable. Spleen: Unremarkable. 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 The larger measures up to 27 mm and is indeterminate by densitometry, but left adrenal mass has been noted since at least 2006 abdominal CT report, images not available. Left renal cyst. No hydronephrosis or urolithiasis. 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 Negative decompressed urinary bladder. Stomach/Bowel: There is diffuse small bowel distention and fluid filling with mildly hazy  mesenteries. Similar features in the colon proximally and at the transverse segment. No pneumatosis or perforation is noted. Nasogastric tube tip is at the pylorus. Vascular/Lymphatic: There is intimal flap displacement from known dissection, morphology appearing similar to prior. No mass or adenopathy. Reproductive:Hysterectomy.  Unremarkable ovaries. Other: No ascites or pneumoperitoneum.  Anasarca. Musculoskeletal: No acute abnormalities. These results were called by telephone at the time of interpretation on 06/26/2016 at 2:05 pm to Dr. Ivin Poot , who verbally acknowledged these results. IMPRESSION: 1. Diffuse airspace disease. Pattern can be seen with noncardiogenic edema (ARDS), inflammatory pneumonitis, or atypical infection. Multi segment atelectasis at the bases and small pleural effusions. 2. Diffuse small bowel and colonic distention with fluid levels as seen with ileus. Question underlying bowel ischemia given the patient's known aortic dissection with proximal IMA occlusion and SMA flow via the narrow true lumen. 3. Incidental findings noted above. Electronically Signed   By: Monte Fantasia M.D.   On: 06/26/2016 14:05   US Abdomen Complete  Result Date: 06/02/2016 CLINICAL DATA:  Nausea and abdominal distention EXAM: ABDOMEN ULTRASOUND COMPLETE COMPARISON:  None. FINDINGS: Gallbladder: Surgically removed Common bile duct: Diameter: 6 mm Liver: Increased in echogenicity consistent with fatty infiltration. No significant nodularity is noted. IVC: No abnormality visualized. Pancreas: Visualized portion unremarkable. Spleen: Size and appearance within normal limits. Right Kidney: Length: 12.5 cm. Echogenicity within normal limits. No mass or hydronephrosis visualized. Left Kidney: Length: 13.6 cm. Echogenicity within normal limits. No mass or hydronephrosis visualized. 2.2 cm cyst is noted in the midportion of the kidney anteriorly. This has increased in size from prior exam at which time it  measured approximately 17 mm. Abdominal aorta: No aneurysm visualized. Other findings: None. IMPRESSION: Simple Left renal cyst increased in size by 5 mm. Status post cholecystectomy. Fatty infiltration of the liver without focal mass. Electronically Signed  By: Inez Catalina M.D.   On: 06/02/2016 08:55   Dg Chest Port 1 View  Result Date: 06/28/2016 CLINICAL DATA:  Aortic dissection. EXAM: PORTABLE CHEST 1 VIEW COMPARISON:  06/27/2016. FINDINGS: The heart is enlarged. There is moderate vascular congestion. Prominence of the aortic contour reflecting the type B dissection. LEFT lower lobe atelectasis with effusion. IMPRESSION: Cardiomegaly. Aortic dissection. Vascular congestion. LEFT lower lobe atelectasis with effusion. Electronically Signed   By: Staci Righter M.D.   On: 06/28/2016 07:13   Dg Chest Port 1 View  Result Date: 06/27/2016 CLINICAL DATA:  ARDS. EXAM: PORTABLE CHEST 1 VIEW COMPARISON:  Jun 26, 2016 FINDINGS: The right PICC line is stable. The NG tube terminates below today's study. Diffuse bilateral primarily interstitial opacities are mildly more homogeneous in less patchy in appearance. More focal opacity in the left base is stable. No other changes. IMPRESSION: 1. Diffuse bilateral primarily interstitial opacities suggesting edema remain. More focal opacity in the left base is stable to mildly improved. Electronically Signed   By: Dorise Bullion III M.D   On: 06/27/2016 07:21   Dg Chest Port 1 View  Result Date: 06/26/2016 CLINICAL DATA:  Shortness of breath. EXAM: PORTABLE CHEST 1 VIEW COMPARISON:  Radiograph of Jun 25, 2016. FINDINGS: Stable cardiomegaly. Increased bilateral diffuse interstitial and basilar opacities are noted concerning for worsening edema. No pneumothorax is noted. Mild left pleural effusion is noted with associated atelectasis or infiltrate. Right-sided PICC line is unchanged in position. Nasogastric tube is unchanged in position. Bony thorax is unremarkable.  IMPRESSION: Increased bilateral diffuse interstitial densities consistent with edema. Mild left pleural effusion is noted with associated atelectasis or infiltrate. Electronically Signed   By: Marijo Conception, M.D.   On: 06/26/2016 07:40   Dg Chest Port 1 View  Result Date: 06/25/2016 CLINICAL DATA:  Shortness of Breath EXAM: PORTABLE CHEST 1 VIEW COMPARISON:  06/24/2016 FINDINGS: Cardiac shadow is prominent but stable. Right-sided PICC line and nasogastric catheter are again seen and stable. Bibasilar atelectatic changes are noted worse on the left than the right and increased from the prior exam. Small left pleural effusion is noted as well. Mild vascular congestion is seen. IMPRESSION: Increasing bibasilar infiltrates left greater than right. Mild vascular congestion is noted. The Electronically Signed   By: Inez Catalina M.D.   On: 06/25/2016 07:54   Dg Chest Port 1 View  Result Date: 06/24/2016 CLINICAL DATA:  Shortness of Breath EXAM: PORTABLE CHEST 1 VIEW COMPARISON:  06/23/2016 FINDINGS: Right-sided PICC line is again noted at the cavoatrial junction. Nasogastric catheter is noted in satisfactory position through the overall inspiratory effort is decreased from the prior exam. Mild left basilar atelectasis remains. No other focal abnormality is noted. IMPRESSION: Stable left basilar atelectasis. Electronically Signed   By: Inez Catalina M.D.   On: 06/24/2016 08:07   Dg Chest Port 1 View  Result Date: 06/23/2016 CLINICAL DATA:  61 year old female with shortness of breath and abdominal pain. 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 Being treated conservatively at this time, including with bowel rest. EXAM: PORTABLE CHEST 1 VIEW COMPARISON:  Chest radiographs 06/22/2016 and earlier. CTA 06/20/2016 FINDINGS: Portable AP semi upright view at 0754 hours. Stable enteric tube which courses to  the abdomen. Stable right PICC line. Mildly larger lung volumes and decreased streaky bibasilar opacity. Mild residual. No pneumothorax or pulmonary edema. No pleural effusion or consolidation. Stable  cardiac size and mediastinal contours. IMPRESSION: 1.  Stable lines and tubes. 2. Mildly improved lung volumes and regressed bibasilar opacity favored to be atelectasis. Electronically Signed   By: Genevie Ann M.D.   On: 06/23/2016 08:19   Dg Chest Port 1 View  Result Date: 06/22/2016 CLINICAL DATA:  61 year old female with history of abdominal aortic aneurysm. Shortness of breath and vomiting. EXAM: PORTABLE CHEST 1 VIEW COMPARISON:  Chest x-ray 06/21/2016. FINDINGS: There is a right upper extremity PICC with tip terminating in the superior cavoatrial junction. A nasogastric tube is seen extending into the stomach, however, the tip of the nasogastric tube extends below the lower margin of the image. Lung volumes are low. Linear bibasilar opacities favored to reflect areas of subsegmental atelectasis, however, underlying airspace consolidation from infection or aspiration is not excluded. No pleural effusions. No evidence of pulmonary edema. Heart size is normal. Upper mediastinal contours are within normal limits. IMPRESSION: 1. Support apparatus, as above. 2. Low lung volumes with bibasilar areas of atelectasis and/or airspace consolidation. Electronically Signed   By: Vinnie Langton M.D.   On: 06/22/2016 07:30   Dg Chest Port 1 View  Result Date: 06/21/2016 CLINICAL DATA:  Aortic dissection EXAM: PORTABLE CHEST 1 VIEW COMPARISON:  06/20/2016 FINDINGS: Bibasilar atelectasis. Heart is normal size. No effusions or acute bony abnormality. IMPRESSION: Bibasilar atelectasis. Electronically Signed   By: Rolm Baptise M.D.   On: 06/21/2016 07:18   Dg Chest Port 1 View  Result Date: 06/20/2016 CLINICAL DATA:  Hypoxia, unresponsive EXAM: PORTABLE CHEST 1 VIEW COMPARISON:  CTA chest dated 06/20/2016 FINDINGS: Lungs  are essentially clear. No focal consolidation. No pleural effusion or pneumothorax. The heart is top-normal in size. IMPRESSION: No evidence of acute cardiopulmonary disease. Electronically Signed   By: Julian Hy M.D.   On: 06/20/2016 17:01   Dg Chest Port 1 View  Result Date: 06/20/2016 CLINICAL DATA:  Sudden onset sharp substernal chest pain that radiates to the back. Nausea, dizziness and shortness of breath. EXAM: PORTABLE CHEST 1 VIEW COMPARISON:  None. FINDINGS: Trachea is midline. Heart size is accentuated by AP supine technique. Probable mild scarring in the right middle lobe and lingula. Lungs are otherwise clear. No pleural fluid. IMPRESSION: No acute findings. Electronically Signed   By: Lorin Picket M.D.   On: 06/20/2016 09:56   Dg Abd Portable 1v  Result Date: 06/27/2016 CLINICAL DATA:  Ileus. EXAM: PORTABLE ABDOMEN - 1 VIEW COMPARISON:  Jun 26, 2016 FINDINGS: The NG tube terminates within the stomach. The bowel gas pattern is unremarkable on today's study. No other acute abnormalities. IMPRESSION: The NG tube terminates within the stomach. The bowel gas pattern is unremarkable. Electronically Signed   By: Dorise Bullion III M.D   On: 06/27/2016 07:19   Dg Abd Portable 1v  Result Date: 06/26/2016 CLINICAL DATA:  Shortness of breath, abdominal pain EXAM: PORTABLE ABDOMEN - 1 VIEW COMPARISON:  06/25/2016 FINDINGS: NG tube tip is in the distal stomach. Prior cholecystectomy. Nonobstructive bowel gas pattern. No free air organomegaly. IMPRESSION: NG tube tip in the distal stomach.  No acute findings. Electronically Signed   By: Rolm Baptise M.D.   On: 06/26/2016 07:36   Dg Abd Portable 1v  Result Date: 06/25/2016 CLINICAL DATA:  Shortness of breath.  Abdominal pain. EXAM: PORTABLE ABDOMEN - 1 VIEW COMPARISON:  Jun 24, 2016 FINDINGS: The distal tip of the NG tube is near the gastric antrum. Mild opacity in left lung base will be better assessed  on today's chest x-ray. No free air  or portal venous gas identified although evaluation is limited on supine imaging. No bowel obstruction. IMPRESSION: No interval change or acute abnormality in the abdomen. Electronically Signed   By: Dorise Bullion III M.D   On: 06/25/2016 07:54   Dg Abd Portable 1v  Result Date: 06/24/2016 CLINICAL DATA:  Abdominal distention. EXAM: PORTABLE ABDOMEN - 1 VIEW COMPARISON:  06/23/2016 . FINDINGS: NG tube noted in stable position with tip in the stomach . Surgical clips right upper quadrant. Several nonspecific loops of air-filled small bowel again noted. Colonic gas pattern is normal. No free air. IMPRESSION: 1.  NG tube in stable position. 2. Several nonspecific loops of air-filled small bowel again noted. No evidence of progressive bowel distention. Colonic gas pattern normal. No free air. Electronically Signed   By: Marcello Moores  Register   On: 06/24/2016 08:06   Dg Abd Portable 1v  Result Date: 06/23/2016 CLINICAL DATA:  61 year old female with shortness of breath and abdominal pain. 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 Being treated conservatively at this time, including with bowel rest. EXAM: PORTABLE ABDOMEN - 1 VIEW COMPARISON:  Abdominal radiographs 06/22/2016. CTA 06/20/2016, and earlier. FINDINGS: Portable AP supine view at 0759 hours. Stable NG tube, side hole to level of the gastric body and tip at the level of the gastric antrum. Stable cholecystectomy clips. Stable bowel gas pattern with several gas containing nondilated small and large bowel loops. No definite pneumoperitoneum on this supine view. Abdominal and pelvic visceral contours appear stable. No acute osseous abnormality identified. IMPRESSION: 1. Stable NG tube position. 2. Stable, nonobstructed bowel-gas pattern. Electronically Signed   By: Genevie Ann M.D.   On: 06/23/2016 08:18   Dg Abd Portable 1v  Result Date:  06/22/2016 CLINICAL DATA:  Abdominal pain for few days with nausea and vomiting. EXAM: PORTABLE ABDOMEN - 1 VIEW COMPARISON:  06/22/2016 abdominal radiograph. FINDINGS: Enteric tube loops in the gastric fundus and terminates in the body of the stomach, without appreciable kink. Top-normal caliber small bowel loops throughout the abdomen. Minimal colonic stool. No evidence of pneumatosis or pneumoperitoneum. No radiopaque urolithiasis. Cholecystectomy clips are seen in the right upper quadrant of the abdomen. Patchy opacities at the lung bases. IMPRESSION: 1. Enteric tube loops in the gastric fundus and terminates in the body of the stomach without appreciable kink. 2. Top-normal caliber small bowel loops, unchanged. 3. Patchy bibasilar lung opacities, correlate with chest radiograph. Electronically Signed   By: Ilona Sorrel M.D.   On: 06/22/2016 16:58   Dg Abd Portable 1v  Result Date: 06/22/2016 CLINICAL DATA:  Vomiting.  Shortness of breath.  AAA . EXAM: PORTABLE ABDOMEN - 1 VIEW COMPARISON:  06/21/2016. FINDINGS: Surgical clips right upper quadrant. NG tube noted with tip over the distal stomach. No bowel distention. No acute bony abnormality identified. Mild basilar atelectasis. IMPRESSION: NG tube noted with its tip over the distal stomach. No bowel distention or acute intra-abdominal abnormality identified. Electronically Signed   By: Marcello Moores  Register   On: 06/22/2016 07:30   Dg Abd Portable 1v  Result Date: 06/21/2016 CLINICAL DATA:  Feeding tube placement EXAM: PORTABLE ABDOMEN - 1 VIEW COMPARISON:  06/21/2016 FINDINGS: NG tube coils in the fundus of the stomach with the tip in the distal stomach. Decompression of the stomach. IMPRESSION: NG tube tip in the distal stomach. Electronically Signed   By: Lennette Bihari  [place] M.D.   On: 06/21/2016 15:46   Dg Abd Portable 1v  Result Date: 06/21/2016 CLINICAL DATA:  Ileus  Vomiting that started this AM per patient EXAM: PORTABLE ABDOMEN - 1 VIEW COMPARISON:   06/20/2016 FINDINGS: Gaseous distention of the stomach. Paucity of small bowel and colonic gas. Cholecystectomy clips. Linear scarring/ atelectasis in the lung bases. IMPRESSION: 1. Gaseous distention of the stomach. Electronically Signed   By: Lucrezia Europe M.D.   On: 06/21/2016 11:54   Dg Abd Portable 1v  Result Date: 06/20/2016 CLINICAL DATA:  Aortic dissection EXAM: PORTABLE ABDOMEN - 1 VIEW COMPARISON:  CT abdomen/ pelvis dated 06/20/2016 FINDINGS: Nonobstructive bowel gas pattern. Visualized osseous structures are within normal limits. IMPRESSION: Unremarkable abdominal radiograph. Electronically Signed   By: Julian Hy M.D.   On: 06/20/2016 17:02   Ct Angio Chest/abd/pel For Dissection W And/or W/wo  Result Date: 06/27/2016 CLINICAL DATA:  61 year old female with a history of type B dissection EXAM: CT ANGIOGRAPHY CHEST, ABDOMEN AND PELVIS TECHNIQUE: Multidetector CT imaging through the chest, abdomen and pelvis was performed using the standard protocol during bolus administration of intravenous contrast. Multiplanar reconstructed images and MIPs were obtained and reviewed to evaluate the vascular anatomy. CONTRAST:  100 cc Isovue 370 COMPARISON:  CT 06/20/2016 FINDINGS: CTA CHEST FINDINGS Cardiovascular: Heart: Heart size unchanged. No pericardial fluid/ thickening. No significant coronary calcifications. Aorta: Re- demonstration of type B dissection with the entry tear appearing to originate just beyond the origin of the left subclavian artery. Branch vessels remain patent without extension of the dissection flap into the branch vessels. Caliber and contour of the ascending aorta unremarkable without evidence of retrograde extension. Greatest diameter of the ascending aorta 2.9 cm. Inflammatory changes surrounding the distal aortic arch in the proximal descending aorta. Greatest diameter of the distal aortic arch measures approximately 3.4 cm. Greatest diameter on the comparison CT approximately  3.0 cm. No aneurysm of the descending thoracic aorta with the greatest diameter of the false lumen measuring 2.7 cm. True lumen is compressed. There is a fenestration in the distal true lumen at the level of the diaphragm just above the aortic hiatus. Pulmonary arteries: No lobar, segmental, or proximal subsegmental filling defects. Mediastinum/Nodes: No mediastinal hemorrhage. Mediastinal lymph nodes are present. Gastric tube within the esophagus. Lungs/Pleura: Ground-glass opacities developing through the the bilateral lungs. Developing interlobular septal thickening. Atelectasis of the medial segment left lower lobe. Small low-density left pleural effusion trace right-sided pleural effusion and associated atelectasis. No pneumothorax. Right upper extremity PICC appears to terminate superior vena cava. Review of the MIP images confirms the above findings. CTA ABDOMEN AND PELVIS FINDINGS VASCULAR Aorta: Re- demonstration of dissection flap of the abdominal aorta. No aneurysm.  No periaortic fluid of the abdomen. The true lumen is compressed throughout the abdomen. True lumen contributes to celiac artery origin, superior mesenteric artery origin, and also continues across the origin of the inferior mesenteric artery. The false lumen contributes to perfusion of the right renal artery. The lateral margin of the dissection flap involves the origin of 2 left renal arteries both superior and inferior. True lumen is decompressed in the inferior aorta extending into the bilateral iliac arteries. The bilateral common iliac arteries appear perfused from the false lumen bilaterally. False lumen extends in the bilateral external iliac artery. Likely re- entry tear of distal external iliac arteries bilaterally, on the right image 173, on the left image 171. Dissection flap extends into the bilateral hypogastric arteries which are partially patent with  decreased flow. Celiac: Origin of the celiac artery originates from the true  lumen which is decompressed. Celiac artery remains patent at this time including the branch vessels. Typical branch pattern of splenic artery, left gastric artery, common hepatic artery. SMA: Superior mesenteric artery origin originates from the true lumen which is compressed. SMA remains patent at this time. Renals: Right renal artery originates from the false lumen. Right renal artery is perfusing at this time with uniform perfusion of the right kidney. There are 2 left renal arteries, superior and inferior. Both arteries originate near the margin of the dissection flap. The superior left renal artery is partially filling, at the in flexion point of the dissection flap, perfusing superior kidney. The inferior left renal artery appears thrombosed, new from the comparison with enlarging left renal infarction, predominantly of the lower pole. IMA: Origin of the inferior mesenteric artery is thrombosed given the collapsed true lumen at this level. There is re- constitution of the distal inferior mesenteric artery via collateral flow. Right lower extremity: False lumen perfuses the right common iliac artery with collapse of the true lumen. Dissection flap extends into the hypogastric artery, with the distal pelvic branch is partially opacifying. External iliac artery is perfusing from the false lumen, with unremarkable appearance of the distal external iliac artery, common femoral artery, profunda femoris, SFA. There is the appearance of a re- entry tear in the mid right external iliac artery. Left lower extremity: False lumen perfuses the left common iliac artery with collapse of the true lumen. Dissection extends into the hypogastric artery which is partially perfused with opacification of pelvic vessels. Distal external iliac artery an the common femoral artery unremarkable, with patent proximal femoral vessels. There is the appearance of a re- entry tear in the mid left external iliac artery. Veins: Unremarkable  appearance of the venous system. Review of the MIP images confirms the above findings. NON-VASCULAR Hepatobiliary: Unremarkable appearance of the liver. Cholecystectomy Pancreas: Unremarkable appearance of the pancreas. No pericholecystic fluid or inflammatory changes. Unremarkable ductal system. Spleen: Unremarkable. Adrenals/Urinary Tract: Right adrenal gland unremarkable. Nodule of the left adrenal gland which measures 2.7 cm. Right: Right-sided kidney perfuses uniformly with no hydronephrosis. Left: Progressive left renal infarct with small portion of the superior kidney perfusing. The remainder of the left kidney is hypoperfused, progressed from the comparison. No hydronephrosis. Urinary catheter within the urinary bladder. Stomach/Bowel: Unremarkable appearance of stomach with gastric tube in place. Small bowel borderline dilated and fluid-filled. No transition point. The wall of the small bowel relatively uniformly thin and enhancing, although the study is timed for the arterial phase. No focal wall thickening. No abnormally distended colon. No transition point. Fluid filled colon. No focal wall thickening or pericolonic inflammatory changes. Lymphatic: Multiple lymph nodes in the para-aortic nodal station, none of which are enlarged. Mesenteric: No free fluid or air. No adenopathy. Reproductive: Hysterectomy Other: No hernia. Musculoskeletal: No displaced fracture. Degenerative changes of the spine. 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 Only 1 possible fenestration is identified, in the distal thoracic aorta above the aortic hiatus. There is enlarging diameter of the distal aortic arch with associated inflammatory changes, measuring approximately 3.5 on today's study compared to approximately 3.1 cm previously. The appearance is concerning for progression/expansion of intramural hematoma. Progressing left renal infarction,  with near complete thrombosis of superior and inferior renal arteries, both of which originate at the margin of the dissection flap. Bilateral common iliac arteries  appear to be perfused from the false lumen, with complete collapse of true lumen proximally. There does appear to be re- entry to the true lumen in the mid external iliac artery bilaterally, as there is unremarkable appearance of the proximal femoral vasculature including bilateral common iliac arteries. The 3 mesenteric vessels originate from the small true lumen, which is progressively compressed. Celiac artery and superior mesenteric artery remain patent, with occlusion at the origin of the inferior mesenteric artery. Three vessel arch, with all 3 branches remain patent. No evidence of extension of the dissection flap into the branch vessels. These preliminary results were discussed by telephone at the time of interpretation on 06/27/2016 at 12:41 pm with Dr. Curt Jews. Borderline dilated small bowel without transition point or obstruction. Findings may represent ileus and/or nonspecific enteritis. No focal wall thickening to suggest Neosha Switalski ischemia, although the timing of this CTA is specific for arterial evaluation and not the bowel tissues. If ongoing concern for bowel ischemia, would repeat abd/pelvis contrast enhanced-CT portal venous phase. Similar appearance of mixed geographic ground-glass opacities of the bilateral lungs with mild interlobular septal thickening. Again, differential diagnosis includes pulmonary edema, ARDS, atypical infection. Signed, Dulcy Fanny. Earleen Newport, DO [facility] Electronically Signed   By: Corrie Mckusick D.O.   On: 06/27/2016 13:18   Ct Angio Chest/abd/pel For Dissection W And/or W/wo  Result Date: 06/20/2016 CLINICAL DATA:  Substernal chest pain radiating to back EXAM: CT ANGIOGRAPHY CHEST, ABDOMEN AND PELVIS TECHNIQUE: Multidetector CT imaging through the  chest, abdomen and pelvis was performed using the standard protocol during bolus administration of intravenous contrast. Multiplanar reconstructed images and MIPs were obtained and reviewed to evaluate the vascular anatomy. CONTRAST:  100 cc Isovue 370 IV COMPARISON:  CT abdomen and pelvis 10/27/2011 FINDINGS: CTA CHEST FINDINGS Cardiovascular: There is the type B aortic dissection beginning in the distal aortic arch just beyond the origin of the great vessels. The true lumen is compressed by the false lumen. No aneurysm. No pulmonary embolus. Heart is normal size. Mediastinum/Nodes: No mediastinal, hilar, or axillary adenopathy. Lungs/Pleura: Atelectasis or scarring in the lingula. Lungs otherwise clear. No effusions. Musculoskeletal: No acute bony abnormality. Review of the MIP images confirms the above findings. CTA ABDOMEN AND PELVIS FINDINGS VASCULAR Aorta: Dissection continues throughout the abdominal aorta with the celiac artery and superior mesenteric artery arising from the true lumen anteriorly. The dissection may extend into the left renal artery where there is only a small amount of blood flow noted in the proximal and mid left renal artery. Right renal artery appears to arise from the false lumen and is patent. The the true lumen appears thrombosed below the origin of the superior mesenteric artery. Inferior mesenteric artery arises from the thrombosed true lumen and appears occluded proximally, reconstitutes several cm from the origin. Celiac: Patent. SMA: Patent. Renals: As above. IMA: As above. Inflow: To the dissection continues into both common iliac arteries with thrombosed true lumens. The dissection appears to terminate at approximately the level of the common iliac bifurcation bilaterally. Veins: Grossly patent and unremarkable. Review of the MIP images confirms the above findings. NON-VASCULAR Hepatobiliary: Mild diffuse fatty infiltration. Prior cholecystectomy. Pancreas: No focal abnormality  or ductal dilatation. Spleen: No focal abnormality.  Normal size. Adrenals/Urinary Tract: Nodules in the left adrenal gland are low-density on the precontrast imaging compatible with small adenomas. There are areas of non perfusion noted in the mid and lower poles of the left kidney compatible with infarction,  likely related to the involvement of the left renal artery by dissection. Urinary bladder unremarkable. Stomach/Bowel: Stomach, large and small bowel grossly unremarkable. Lymphatic: No adenopathy. Reproductive: Prior hysterectomy.  No adnexal masses. Other: No free fluid or free air. Musculoskeletal: No acute bony abnormality. Review of the MIP images confirms the above findings. 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 This involves the descending thoracic aorta, abdominal aorta and iliac vessels. The true lumen is compressed by the larger false lumen, and is thrombosed below the SMA/renal artery origins. The dissection appears to extend into the left renal artery with partial occlusion and areas of infarct in the mid and lower pole of the left kidney. Fatty infiltration of the liver. Critical Value/emergent results were called by telephone at the time of interpretation on 06/20/2016 at 10:13 am to Dr. Fredia Sorrow , who verbally acknowledged these results. Electronically Signed   By: Rolm Baptise M.D.   On: 06/20/2016 10:15   Anti-infectives: Anti-infectives    Start     Dose/Rate Route Frequency Ordered Stop   06/26/16 0830  meropenem (MERREM) 1 g in sodium chloride 0.9 % 100 mL IVPB     1 g 200 mL/hr over 30 Minutes Intravenous Every 8 hours 06/26/16 0810     06/22/16 1800  clindamycin (CLEOCIN) IVPB 600 mg  Status:  Discontinued     600 mg 100 mL/hr over 30 Minutes Intravenous Every 8 hours 06/22/16 1723 06/26/16 0810   06/22/16 1000  azithromycin (ZITHROMAX) 250 mg in dextrose 5 % 125 mL IVPB  Status:  Discontinued      250 mg 125 mL/hr over 60 Minutes Intravenous Every 24 hours 06/22/16 0826 06/27/16 1019   06/22/16 0900  levofloxacin (LEVAQUIN) IVPB 500 mg  Status:  Discontinued     500 mg 100 mL/hr over 60 Minutes Intravenous Every 24 hours 06/22/16 0759 06/22/16 0824      Assessment/Plan: s/p * No surgery found * Continue support. Long discussion with the husband regarding ICU psychosis. Splane that this will in all likelihood, and go and is typically worse at night. Continue support   LOS: 8 days   Curt Jews 06/28/2016, 9:37 AM

## 2016-06-28 NOTE — Progress Notes (Signed)
Patient ID: Haley Graham, female   DOB: 11-06-1955, 61 y.o.   MRN: [id number] EVENING ROUNDS NOTE :     [address]       [place] [address]             [phone]                     Total Length of Stay:  LOS: 8 days  BP (!) 109/40   Pulse 71   Temp 98.2 F (36.8 C) (Oral)   Resp 17   Ht 5\' 3"  (1.6 m)   Wt 188 lb 15 oz (85.7 kg)   SpO2 94%   BMI 33.47 kg/m   .Intake/Output      05/27 0701 - 05/28 0700   I.V. (mL/kg) 1656.5 (19.3)   NG/GT 30   IV Piggyback 400   Total Intake(mL/kg) 2086.5 (24.3)   Urine (mL/kg/hr) 2650 (2.5)   Emesis/NG output 650 (0.6)   Total Output 3300   Net -1213.5         . Marland KitchenTPN (CLINIMIX-E) Adult 83 mL/hr at 06/28/16 1759  . sodium chloride    . esmolol 275 mcg/kg/min (06/28/16 1915)  . meropenem (MERREM) IV Stopped (06/28/16 1700)     Lab Results  Component Value Date   WBC 30.6 (H) 06/28/2016   HGB 11.6 (L) 06/28/2016   HCT 34.6 (L) 06/28/2016   PLT 138 (L) 06/28/2016   GLUCOSE 169 (H) 06/28/2016   TRIG 506 (H) 06/24/2016   ALT 95 (H) 06/28/2016   AST 56 (H) 06/28/2016   NA 139 06/28/2016   K 3.4 (L) 06/28/2016   CL 109 06/28/2016   CREATININE 1.35 (H) 06/28/2016   BUN 55 (H) 06/28/2016   CO2 22 06/28/2016   Responded to lasix with good diuresis, mid day had more sob and tachypnic  Feels better this afternoon ABG, not acidotic  Exam unchanged   [name]  Beeper [phone] Office [phone] 06/28/2016 7:33 PM

## 2016-06-28 NOTE — Progress Notes (Signed)
PULMONARY / CRITICAL CARE MEDICINE   Name: GIRL SCHISSLER MRN: [id number] DOB: September 27, 1955    ADMISSION DATE:  06/20/2016 CONSULTATION DATE:  06/26/16  REFERRING MD:  Dr. 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  Course complicated by hypoxia from pneumonia, ileus, AKI.  SUBJECTIVE:  Breathing is doing ok . sats ok on O2   VITAL SIGNS: BP (!) 124/54   Pulse 74   Temp 97.4 F (36.3 C) (Oral)   Resp (!) 24   Ht 5\' 3"  (1.6 m)   Wt 188 lb 15 oz (85.7 kg)   SpO2 93%   BMI 33.47 kg/m   INTAKE / OUTPUT: I/O last 3 completed shifts: In: 5076.6 [I.V.:4481.6; NG/GT:120; IV Piggyback:475] Out: 6820 [Urine:4920; Emesis/NG output:1900]  PHYSICAL EXAMINATION:  General - alert , ox3  Eyes -pupils reactive  ENT - clear . Cardiac - RRR  Chest - diminshed bs in bases  Abd - soft nt/ bs + Ext - no edema  Skin -no rash , clear  Neuro - alert/ ox3  Psych - calm   LABS:  BMET  Recent Labs Lab 06/26/16 1715 06/27/16 0340 06/28/16 0347  NA 126* 137 139  K 5.4* 4.0 3.4*  CL 98* 106 109  CO2 20* 22 22  BUN 46* 46* 55*  CREATININE 1.28* 1.11* 1.35*  GLUCOSE 906* 168* 169*    Electrolytes  Recent Labs Lab 06/25/16 0643 06/26/16 0535 06/26/16 1715 06/27/16 0340 06/28/16 0347  CALCIUM 8.6* 8.7* 8.7* 8.8* 8.6*  MG 2.2 1.8  --  1.9  --   PHOS 3.1 4.3  --  4.3  --     CBC  Recent Labs Lab 06/26/16 0535 06/27/16 0340 06/28/16 0347  WBC 25.4* 36.7* 30.6*  HGB 11.8* 11.8* 11.6*  HCT 36.6 35.3* 34.6*  PLT 94* 103* 138*    Coag's No results for input(s): APTT, INR in the last 168 hours.  Sepsis Markers  Recent Labs Lab 06/26/16 1553 06/27/16 0340 06/28/16 0347  LATICACIDVEN 1.5 1.3 1.5    ABG  Recent Labs Lab 06/23/16 0338 06/24/16 0332 06/26/16 1512  PHART 7.502* 7.430 7.429  PCO2ART 31.1* 33.1 32.3   PO2ART 103.0 68.9* 66.0*    Liver Enzymes  Recent Labs Lab 06/26/16 1715 06/27/16 0340 06/28/16 0347  AST 58* 55* 56*  ALT 134* 125* 95*  ALKPHOS 100 128* 146*  BILITOT 0.9 0.8 0.9  ALBUMIN 2.1* 2.2* 1.9*    Cardiac Enzymes No results for input(s): TROPONINI, PROBNP in the last 168 hours.  Glucose  Recent Labs Lab 06/27/16 1205 06/27/16 1708 06/27/16 1955 06/27/16 2359 06/28/16 0358 06/28/16 0904  GLUCAP 78 156* 190* 235* 166* 153*    Imaging Dg Chest Port 1 View  Result Date: 06/28/2016 CLINICAL DATA:  Aortic dissection. EXAM: PORTABLE CHEST 1 VIEW COMPARISON:  06/27/2016. FINDINGS: The heart is enlarged. There is moderate vascular congestion. Prominence of the aortic contour reflecting the type B dissection. LEFT lower lobe atelectasis with effusion. IMPRESSION: Cardiomegaly. Aortic dissection. Vascular congestion. LEFT lower lobe atelectasis with effusion. Electronically Signed   By: Staci Righter M.D.   On: 06/28/2016 07:13   Ct Angio Chest/abd/pel For Dissection W And/or W/wo  Result Date: 06/27/2016 CLINICAL DATA:  61 year old female with a history of type B dissection EXAM: CT ANGIOGRAPHY CHEST, ABDOMEN AND PELVIS TECHNIQUE: Multidetector CT  imaging through the chest, abdomen and pelvis was performed using the standard protocol during bolus administration of intravenous contrast. Multiplanar reconstructed images and MIPs were obtained and reviewed to evaluate the vascular anatomy. CONTRAST:  100 cc Isovue 370 COMPARISON:  CT 06/20/2016 FINDINGS: CTA CHEST FINDINGS Cardiovascular: Heart: Heart size unchanged. No pericardial fluid/ thickening. No significant coronary calcifications. Aorta: Re- demonstration of type B dissection with the entry tear appearing to originate just beyond the origin of the left subclavian artery. Branch vessels remain patent without extension of the dissection flap into the branch vessels. Caliber and contour of the ascending aorta  unremarkable without evidence of retrograde extension. Greatest diameter of the ascending aorta 2.9 cm. Inflammatory changes surrounding the distal aortic arch in the proximal descending aorta. Greatest diameter of the distal aortic arch measures approximately 3.4 cm. Greatest diameter on the comparison CT approximately 3.0 cm. No aneurysm of the descending thoracic aorta with the greatest diameter of the false lumen measuring 2.7 cm. True lumen is compressed. There is a fenestration in the distal true lumen at the level of the diaphragm just above the aortic hiatus. Pulmonary arteries: No lobar, segmental, or proximal subsegmental filling defects. Mediastinum/Nodes: No mediastinal hemorrhage. Mediastinal lymph nodes are present. Gastric tube within the esophagus. Lungs/Pleura: Ground-glass opacities developing through the the bilateral lungs. Developing interlobular septal thickening. Atelectasis of the medial segment left lower lobe. Small low-density left pleural effusion trace right-sided pleural effusion and associated atelectasis. No pneumothorax. Right upper extremity PICC appears to terminate superior vena cava. Review of the MIP images confirms the above findings. CTA ABDOMEN AND PELVIS FINDINGS VASCULAR Aorta: Re- demonstration of dissection flap of the abdominal aorta. No aneurysm.  No periaortic fluid of the abdomen. The true lumen is compressed throughout the abdomen. True lumen contributes to celiac artery origin, superior mesenteric artery origin, and also continues across the origin of the inferior mesenteric artery. The false lumen contributes to perfusion of the right renal artery. The lateral margin of the dissection flap involves the origin of 2 left renal arteries both superior and inferior. True lumen is decompressed in the inferior aorta extending into the bilateral iliac arteries. The bilateral common iliac arteries appear perfused from the false lumen bilaterally. False lumen extends in the  bilateral external iliac artery. Likely re- entry tear of distal external iliac arteries bilaterally, on the right image 173, on the left image 171. Dissection flap extends into the bilateral hypogastric arteries which are partially patent with decreased flow. Celiac: Origin of the celiac artery originates from the true lumen which is decompressed. Celiac artery remains patent at this time including the branch vessels. Typical branch pattern of splenic artery, left gastric artery, common hepatic artery. SMA: Superior mesenteric artery origin originates from the true lumen which is compressed. SMA remains patent at this time. Renals: Right renal artery originates from the false lumen. Right renal artery is perfusing at this time with uniform perfusion of the right kidney. There are 2 left renal arteries, superior and inferior. Both arteries originate near the margin of the dissection flap. The superior left renal artery is partially filling, at the in flexion point of the dissection flap, perfusing superior kidney. The inferior left renal artery appears thrombosed, new from the comparison with enlarging left renal infarction, predominantly of the lower pole. IMA: Origin of the inferior mesenteric artery is thrombosed given the collapsed true lumen at this level. There is re- constitution of the distal inferior mesenteric artery via collateral flow. Right lower extremity:  False lumen perfuses the right common iliac artery with collapse of the true lumen. Dissection flap extends into the hypogastric artery, with the distal pelvic branch is partially opacifying. External iliac artery is perfusing from the false lumen, with unremarkable appearance of the distal external iliac artery, common femoral artery, profunda femoris, SFA. There is the appearance of a re- entry tear in the mid right external iliac artery. Left lower extremity: False lumen perfuses the left common iliac artery with collapse of the true lumen.  Dissection extends into the hypogastric artery which is partially perfused with opacification of pelvic vessels. Distal external iliac artery an the common femoral artery unremarkable, with patent proximal femoral vessels. There is the appearance of a re- entry tear in the mid left external iliac artery. Veins: Unremarkable appearance of the venous system. Review of the MIP images confirms the above findings. NON-VASCULAR Hepatobiliary: Unremarkable appearance of the liver. Cholecystectomy Pancreas: Unremarkable appearance of the pancreas. No pericholecystic fluid or inflammatory changes. Unremarkable ductal system. Spleen: Unremarkable. Adrenals/Urinary Tract: Right adrenal gland unremarkable. Nodule of the left adrenal gland which measures 2.7 cm. Right: Right-sided kidney perfuses uniformly with no hydronephrosis. Left: Progressive left renal infarct with small portion of the superior kidney perfusing. The remainder of the left kidney is hypoperfused, progressed from the comparison. No hydronephrosis. Urinary catheter within the urinary bladder. Stomach/Bowel: Unremarkable appearance of stomach with gastric tube in place. Small bowel borderline dilated and fluid-filled. No transition point. The wall of the small bowel relatively uniformly thin and enhancing, although the study is timed for the arterial phase. No focal wall thickening. No abnormally distended colon. No transition point. Fluid filled colon. No focal wall thickening or pericolonic inflammatory changes. Lymphatic: Multiple lymph nodes in the para-aortic nodal station, none of which are enlarged. Mesenteric: No free fluid or air. No adenopathy. Reproductive: Hysterectomy Other: No hernia. Musculoskeletal: No displaced fracture. Degenerative changes of the spine. 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 Only 1 possible fenestration is identified, in the distal thoracic  aorta above the aortic hiatus. There is enlarging diameter of the distal aortic arch with associated inflammatory changes, measuring approximately 3.5 on today's study compared to approximately 3.1 cm previously. The appearance is concerning for progression/expansion of intramural hematoma. Progressing left renal infarction, with near complete thrombosis of superior and inferior renal arteries, both of which originate at the margin of the dissection flap. Bilateral common iliac arteries appear to be perfused from the false lumen, with complete collapse of true lumen proximally. There does appear to be re- entry to the true lumen in the mid external iliac artery bilaterally, as there is unremarkable appearance of the proximal femoral vasculature including bilateral common iliac arteries. The 3 mesenteric vessels originate from the small true lumen, which is progressively compressed. Celiac artery and superior mesenteric artery remain patent, with occlusion at the origin of the inferior mesenteric artery. Three vessel arch, with all 3 branches remain patent. No evidence of extension of the dissection flap into the branch vessels. These preliminary results were discussed by telephone at the time of interpretation on 06/27/2016 at 12:41 pm with Dr. Curt Jews. Borderline dilated small bowel without transition point or obstruction. Findings may represent ileus and/or nonspecific enteritis. No focal wall thickening to suggest early ischemia, although the timing of this CTA is specific for arterial evaluation and not the bowel tissues. If ongoing concern for bowel ischemia, would repeat abd/pelvis contrast enhanced-CT portal venous phase.  Similar appearance of mixed geographic ground-glass opacities of the bilateral lungs with mild interlobular septal thickening. Again, differential diagnosis includes pulmonary edema, ARDS, atypical infection. Signed, Dulcy Fanny. Earleen Newport, DO [facility]  [facility] Electronically Signed   By: Corrie Mckusick D.O.   On: 06/27/2016 13:18     STUDIES:  CTA Chest, ABD, Pelvis 07/21/2022 >> Type B aortic dissection beginning just beyond the origin of the great vessels from the aortic arch.  This involves the descending thoracic aorta, abdominal aorta and iliac vessels. The true lumen is compressed by the larger false lumen, and is thrombosed below the SMA/renal artery origins. The dissection appears to extend into the left renal artery with partial occlusion and areas of infarct in the mid and lower pole of the left kidney.  CT Chest w/o 5/25 >> diffuse bilateral patchy airspace disease, mutli-segment atelectasis at the bases & small pleural effusions, ? Infiltrate LLL  CT ABD/Pelvis 5/25 >> diffuse small bowel & colonic distention with fluid levels as seen with ileus, ? Underlying bowel ischemia given patients known aortic dissection with proximal IMA occlusion and SMA flow via the narrow true lumen  CT CHEST /ABd/Pelvis 5/26>Type B aortic dissection, c/by left renal artery compromise/renal infarction. Progression on left renal infarct. enlarging aortic arch diameter ?progression of intramural hematoma , Messenteric vessels progressively compressed, w/ occlusion of IMA. Dilated small bowel w/out obstruction ? Ileus. Similar GGO in bilateral lungs.   CULTURES: BCx2 5/21 >> NEG  UC 5/25 >> negative BC 5/25 >  ANTIBIOTICS: Azithro 5/21 >> 5/25  Clinda 5/21 >> 5/25 Meropenem 5/25 >>   SIGNIFICANT EVENTS: 5/19  Admit with sudden sharp chest pain > Type B Dissection   LINES/TUBES: RUE PICC 5/20 >>   DISCUSSION: 61 y/o F admitted with Type B aortic dissection.  Course complicated by ileus, hypoxia with pneumonia.  ASSESSMENT / PLAN:  Acute hypoxic respiratory failure from PNA, pulmonary edema. Tobacco abuse. - cont oxygen to keep SpO2 > 92% - f/u CXR in am  - cont scheduled BDs - continue Abx  Type B aortic dissection. Hx of HTN. -  cont catapres patch, lasix, lopressor, NTG patch, esmolol gtt  Acute renal failure. Hyperkalemia.>resolved  Hypokalemia  - monitor renal f/u - f/u BMET - K + replaced.   Ileus.CT abd/pelvis - dilated small bowel w/o obstruction  - cont TPN   Thrombocytopenia.-improving  - f/u CBC  DM type II. - SSI  Anxiety. Pain control. - prn ativan - duragesic  DVT prophylaxis - SCDs SUP - protonix Nutrition - TNA Goals of care - full code  Updated husband at bedside   Mirayah Wren NP-C  [facility]  [phone]   06/28/2016

## 2016-06-29 ENCOUNTER — Inpatient Hospital Stay (HOSPITAL_COMMUNITY): Payer: BLUE CROSS/BLUE SHIELD

## 2016-06-29 LAB — GLUCOSE, CAPILLARY
Glucose-Capillary: 129 mg/dL — ABNORMAL HIGH (ref 65–99)
Glucose-Capillary: 144 mg/dL — ABNORMAL HIGH (ref 65–99)
Glucose-Capillary: 165 mg/dL — ABNORMAL HIGH (ref 65–99)
Glucose-Capillary: 177 mg/dL — ABNORMAL HIGH (ref 65–99)
Glucose-Capillary: 183 mg/dL — ABNORMAL HIGH (ref 65–99)
Glucose-Capillary: 190 mg/dL — ABNORMAL HIGH (ref 65–99)

## 2016-06-29 LAB — DIFFERENTIAL
BASOS ABS: 0.5 10*3/uL — AB (ref 0.0–0.1)
BASOS PCT: 2 %
Eosinophils Absolute: 0 10*3/uL (ref 0.0–0.7)
Eosinophils Relative: 0 %
LYMPHS PCT: 11 %
Lymphs Abs: 2.5 10*3/uL (ref 0.7–4.0)
MONO ABS: 1.8 10*3/uL — AB (ref 0.1–1.0)
Monocytes Relative: 8 %
NEUTROS PCT: 79 %
Neutro Abs: 18.1 10*3/uL — ABNORMAL HIGH (ref 1.7–7.7)

## 2016-06-29 LAB — COMPREHENSIVE METABOLIC PANEL
ALBUMIN: 1.9 g/dL — AB (ref 3.5–5.0)
ALT: 73 U/L — ABNORMAL HIGH (ref 14–54)
ANION GAP: 9 (ref 5–15)
AST: 52 U/L — AB (ref 15–41)
Alkaline Phosphatase: 133 U/L — ABNORMAL HIGH (ref 38–126)
BILIRUBIN TOTAL: 1 mg/dL (ref 0.3–1.2)
BUN: 56 mg/dL — ABNORMAL HIGH (ref 6–20)
CHLORIDE: 112 mmol/L — AB (ref 101–111)
CO2: 22 mmol/L (ref 22–32)
Calcium: 8.5 mg/dL — ABNORMAL LOW (ref 8.9–10.3)
Creatinine, Ser: 1.3 mg/dL — ABNORMAL HIGH (ref 0.44–1.00)
GFR calc Af Amer: 51 mL/min — ABNORMAL LOW (ref >60)
GFR, EST NON AFRICAN AMERICAN: 44 mL/min — AB (ref >60)
GLUCOSE: 150 mg/dL — AB (ref 65–99)
POTASSIUM: 3.2 mmol/L — AB (ref 3.5–5.1)
Sodium: 143 mmol/L (ref 135–145)
TOTAL PROTEIN: 5.8 g/dL — AB (ref 6.5–8.1)

## 2016-06-29 LAB — CBC
HCT: 34.4 % — ABNORMAL LOW (ref 36.0–46.0)
HEMOGLOBIN: 11.2 g/dL — AB (ref 12.0–15.0)
MCH: 32.8 pg (ref 26.0–34.0)
MCHC: 32.6 g/dL (ref 30.0–36.0)
MCV: 100.9 fL — ABNORMAL HIGH (ref 78.0–100.0)
PLATELETS: 152 10*3/uL (ref 150–400)
RBC: 3.41 MIL/uL — AB (ref 3.87–5.11)
RDW: 14.1 % (ref 11.5–15.5)
WBC: 22.9 10*3/uL — ABNORMAL HIGH (ref 4.0–10.5)

## 2016-06-29 LAB — TRIGLYCERIDES: Triglycerides: 167 mg/dL — ABNORMAL HIGH (ref <150)

## 2016-06-29 LAB — PHOSPHORUS: PHOSPHORUS: 4.4 mg/dL (ref 2.5–4.6)

## 2016-06-29 LAB — PREALBUMIN: PREALBUMIN: 9 mg/dL — AB (ref 18–38)

## 2016-06-29 LAB — MAGNESIUM: MAGNESIUM: 1.9 mg/dL (ref 1.7–2.4)

## 2016-06-29 MED ORDER — INSULIN GLARGINE 100 UNIT/ML ~~LOC~~ SOLN
35.0000 [IU] | Freq: Two times a day (BID) | SUBCUTANEOUS | Status: DC
  Administered 2016-06-29 – 2016-07-01 (×6): 35 [IU] via SUBCUTANEOUS
  Filled 2016-06-29 (×7): qty 0.35

## 2016-06-29 MED ORDER — POTASSIUM CHLORIDE 10 MEQ/50ML IV SOLN
10.0000 meq | INTRAVENOUS | Status: AC
  Administered 2016-06-29 (×4): 10 meq via INTRAVENOUS
  Filled 2016-06-29 (×4): qty 50

## 2016-06-29 MED ORDER — TRACE MINERALS CR-CU-MN-SE-ZN 10-1000-500-60 MCG/ML IV SOLN
INTRAVENOUS | Status: AC
  Administered 2016-06-29: 17:00:00 via INTRAVENOUS
  Filled 2016-06-29: qty 1992

## 2016-06-29 MED ORDER — ALBUTEROL SULFATE (2.5 MG/3ML) 0.083% IN NEBU
2.5000 mg | INHALATION_SOLUTION | RESPIRATORY_TRACT | Status: DC | PRN
  Administered 2016-07-02: 2.5 mg via RESPIRATORY_TRACT
  Filled 2016-06-29 (×2): qty 3

## 2016-06-29 NOTE — Progress Notes (Signed)
Patient ID: Haley Graham, female   DOB: 01-30-1956, 61 y.o.   MRN: [id number] EVENING ROUNDS NOTE :     [address]       [place] [address]             [phone]                     Total Length of Stay:  LOS: 9 days  BP 120/60 (BP Location: Left Arm)   Pulse 71   Temp 97.6 F (36.4 C) (Oral)   Resp 17   Ht 5\' 3"  (1.6 m)   Wt 187 lb 6.3 oz (85 kg)   SpO2 95%   BMI 33.19 kg/m   .Intake/Output      05/27 0701 - 05/28 0700 05/28 0701 - 05/29 0700   I.V. (mL/kg) 3240.9 (38.1) 1572.8 (18.5)   NG/GT 100    IV Piggyback 550 550   Total Intake(mL/kg) 3890.9 (45.8) 2122.8 (25)   Urine (mL/kg/hr) 3925 (1.9) 1645 (1.7)   Emesis/NG output 950 (0.5) 260 (0.3)   Total Output 4875 1905   Net -984.1 +217.8          . Marland KitchenTPN (CLINIMIX-E) Adult 83 mL/hr at 06/29/16 1721  . sodium chloride    . esmolol 250 mcg/kg/min (06/29/16 1756)  . meropenem (MERREM) IV Stopped (06/29/16 1643)     Lab Results  Component Value Date   WBC 22.9 (H) 06/29/2016   HGB 11.2 (L) 06/29/2016   HCT 34.4 (L) 06/29/2016   PLT 152 06/29/2016   GLUCOSE 150 (H) 06/29/2016   TRIG 167 (H) 06/29/2016   ALT 73 (H) 06/29/2016   AST 52 (H) 06/29/2016   NA 143 06/29/2016   K 3.2 (L) 06/29/2016   CL 112 (H) 06/29/2016   CREATININE 1.30 (H) 06/29/2016   BUN 56 (H) 06/29/2016   CO2 22 06/29/2016   Still on esmolol drip Sitting in chair   [name]  Beeper [phone] Office [phone] 06/29/2016 6:41 PM

## 2016-06-29 NOTE — Progress Notes (Signed)
Subjective: Interval History: none.. Some agitation and shortness of breath yesterday. Had less confusion last night.  Objective: Vital signs in last 24 hours: Temp:  [96.5 F (35.8 C)-98.4 F (36.9 C)] 98.2 F (36.8 C) (05/28 0700) Pulse Rate:  [67-82] 71 (05/28 0700) Resp:  [14-27] 23 (05/28 0700) BP: (86-155)/(40-97) 129/56 (05/28 0700) SpO2:  [89 %-100 %] 97 % (05/28 0700) Weight:  [187 lb 6.Haley oz (85 kg)] 187 lb 6.Haley oz (85 kg) (05/28 0400)  Intake/Output from previous day: 05/27 0701 - 05/28 0700 In: 3890.9 [I.V.:3240.9; NG/GT:100; IV Piggyback:550] Out: 1914 [Urine:3925; Emesis/NG output:950] Intake/Output this shift: No intake/output data recorded.  No change. Abdomen soft with mild diffuse tenderness.  Lab Results:  Recent Labs  06/28/16 0347 06/29/16 0336  WBC 30.6* 22.9*  HGB 11.6* 11.2*  HCT 34.6* 34.4*  PLT 138* 152   BMET  Recent Labs  06/28/16 0347 06/29/16 0336  NA 139 143  K Haley.4* Haley.2*  CL 109 112*  CO2 22 22  GLUCOSE 169* 150*  BUN 55* 56*  CREATININE 1.35* 1.30*  CALCIUM 8.6* 8.5*    Studies/Results: Ct Abdomen Pelvis Wo Contrast  Result Date: 06/26/2016 CLINICAL DATA:  Shortness of breath EXAM: CT CHEST, ABDOMEN AND PELVIS WITHOUT CONTRAST TECHNIQUE: Multidetector CT imaging of the chest, abdomen and pelvis was performed following the standard protocol without IV contrast. COMPARISON:  05/Haley/2018 chest CT FINDINGS: CT CHEST FINDINGS Cardiovascular: Normal heart size. No pericardial effusion. 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 Mild haziness around the upper descending segment is likely stable. Stable maximal diameter of 33 mm at this level. No intramural hematoma. Intermittently seen displaced intimal calcification. Mediastinum/Nodes: Negative for adenopathy. Right upper extremity PICC with tip at the SVC level. Lungs/Pleura: Multi segment atelectasis, worse on the left. Small pleural effusions. There is patchy  ground-glass airspace density in the bilateral lungs without gradient. Airways are clear and there is no septal thickening. Musculoskeletal: No acute or aggressive finding. CT ABDOMEN PELVIS FINDINGS Hepatobiliary: Hepatic steatosis.Cholecystectomy with normal common bile duct diameter. Pancreas: Unremarkable. Spleen: Unremarkable. 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 The larger measures up to 27 mm and is indeterminate by densitometry, but left adrenal mass has been noted since at least 2006 abdominal CT report, images not available. Left renal cyst. No hydronephrosis or urolithiasis. 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 Negative decompressed urinary bladder. Stomach/Bowel: There is diffuse small bowel distention and fluid filling with mildly hazy mesenteries. Similar features in the colon proximally and at the transverse segment. No pneumatosis or perforation is noted. Nasogastric tube tip is at the pylorus. Vascular/Lymphatic: There is intimal flap displacement from known dissection, morphology appearing similar to prior. No mass or adenopathy. Reproductive:Hysterectomy.  Unremarkable ovaries. Other: No ascites or pneumoperitoneum.  Anasarca. Musculoskeletal: No acute abnormalities. These results were called by telephone at the time of interpretation on 06/26/2016 at 2:05 pm to Dr. Ivin Poot , who verbally acknowledged these results. IMPRESSION: 1. Diffuse airspace disease. Pattern can be seen with noncardiogenic edema (ARDS), inflammatory pneumonitis, or atypical infection. Multi segment atelectasis at the bases and small pleural effusions. 2. Diffuse small bowel and colonic distention with fluid levels as seen with ileus. Question underlying bowel ischemia given the patient's known aortic dissection with proximal IMA occlusion and SMA flow via the narrow true lumen. Haley. Incidental findings noted  above. Electronically Signed   By:  Monte Fantasia M.D.   On: 06/26/2016 14:05   Ct Chest Wo Contrast  Result Date: 06/26/2016 CLINICAL DATA:  Shortness of breath EXAM: CT CHEST, ABDOMEN AND PELVIS WITHOUT CONTRAST TECHNIQUE: Multidetector CT imaging of the chest, abdomen and pelvis was performed following the standard protocol without IV contrast. COMPARISON:  05/Haley/2018 chest CT FINDINGS: CT CHEST FINDINGS Cardiovascular: Normal heart size. No pericardial effusion. 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 Mild haziness around the upper descending segment is likely stable. Stable maximal diameter of 33 mm at this level. No intramural hematoma. Intermittently seen displaced intimal calcification. Mediastinum/Nodes: Negative for adenopathy. Right upper extremity PICC with tip at the SVC level. Lungs/Pleura: Multi segment atelectasis, worse on the left. Small pleural effusions. There is patchy ground-glass airspace density in the bilateral lungs without gradient. Airways are clear and there is no septal thickening. Musculoskeletal: No acute or aggressive finding. CT ABDOMEN PELVIS FINDINGS Hepatobiliary: Hepatic steatosis.Cholecystectomy with normal common bile duct diameter. Pancreas: Unremarkable. Spleen: Unremarkable. 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 The larger measures up to 27 mm and is indeterminate by densitometry, but left adrenal mass has been noted since at least 2006 abdominal CT report, images not available. Left renal cyst. No hydronephrosis or urolithiasis. 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 Negative decompressed urinary bladder. Stomach/Bowel: There is diffuse small bowel distention and fluid filling with mildly hazy mesenteries. Similar features in the colon proximally and at the transverse segment. No pneumatosis or perforation is noted. Nasogastric tube tip is  at the pylorus. Vascular/Lymphatic: There is intimal flap displacement from known dissection, morphology appearing similar to prior. No mass or adenopathy. Reproductive:Hysterectomy.  Unremarkable ovaries. Other: No ascites or pneumoperitoneum.  Anasarca. Musculoskeletal: No acute abnormalities. These results were called by telephone at the time of interpretation on 06/26/2016 at 2:05 pm to Dr. Ivin Poot , who verbally acknowledged these results. IMPRESSION: 1. Diffuse airspace disease. Pattern can be seen with noncardiogenic edema (ARDS), inflammatory pneumonitis, or atypical infection. Multi segment atelectasis at the bases and small pleural effusions. 2. Diffuse small bowel and colonic distention with fluid levels as seen with ileus. Question underlying bowel ischemia given the patient's known aortic dissection with proximal IMA occlusion and SMA flow via the narrow true lumen. Haley. Incidental findings noted above. Electronically Signed   By: Monte Fantasia M.D.   On: 06/26/2016 14:05   US Abdomen Complete  Result Date: 06/02/2016 CLINICAL DATA:  Nausea and abdominal distention EXAM: ABDOMEN ULTRASOUND COMPLETE COMPARISON:  None. FINDINGS: Gallbladder: Surgically removed Common bile duct: Diameter: 6 mm Liver: Increased in echogenicity consistent with fatty infiltration. No significant nodularity is noted. IVC: No abnormality visualized. Pancreas: Visualized portion unremarkable. Spleen: Size and appearance within normal limits. Right Kidney: Length: 12.5 cm. Echogenicity within normal limits. No mass or hydronephrosis visualized. Left Kidney: Length: 13.6 cm. Echogenicity within normal limits. No mass or hydronephrosis visualized. 2.2 cm cyst is noted in the midportion of the kidney anteriorly. This has increased in size from prior exam at which time it measured approximately 17 mm. Abdominal aorta: No aneurysm visualized. Other findings: None. IMPRESSION: Simple Left renal cyst increased in size by 5 mm.  Status post cholecystectomy. Fatty infiltration of the liver without focal mass. Electronically Signed   By: Inez Catalina M.D.   On: 06/02/2016 08:55   Dg Chest Port 1 View  Result Date: 06/29/2016 CLINICAL DATA:  Shortness of Breath EXAM: PORTABLE CHEST 1 VIEW COMPARISON:  06/28/2016 FINDINGS: Nasogastric catheter is noted coiled within the stomach. Right-sided PICC line is seen in the distal superior vena cava. Cardiac shadow is mildly enlarged but stable. Bilateral vascular congestion and edema is seen stable from prior exam. Some atelectasis remains in the left retrocardiac region. IMPRESSION: No significant interval change from the prior exam. Electronically Signed   By: Inez Catalina M.D.   On: 06/29/2016 07:36   Dg Chest Port 1 View  Result Date: 06/28/2016 CLINICAL DATA:  Aortic dissection. EXAM: PORTABLE CHEST 1 VIEW COMPARISON:  06/27/2016. FINDINGS: The heart is enlarged. There is moderate vascular congestion. Prominence of the aortic contour reflecting the type B dissection. LEFT lower lobe atelectasis with effusion. IMPRESSION: Cardiomegaly. Aortic dissection. Vascular congestion. LEFT lower lobe atelectasis with effusion. Electronically Signed   By: Staci Righter M.D.   On: 06/28/2016 07:13   Dg Chest Port 1 View  Result Date: 06/27/2016 CLINICAL DATA:  ARDS. EXAM: PORTABLE CHEST 1 VIEW COMPARISON:  Jun 26, 2016 FINDINGS: The right PICC line is stable. The NG tube terminates below today's study. Diffuse bilateral primarily interstitial opacities are mildly more homogeneous in less patchy in appearance. More focal opacity in the left base is stable. No other changes. IMPRESSION: 1. Diffuse bilateral primarily interstitial opacities suggesting edema remain. More focal opacity in the left base is stable to mildly improved. Electronically Signed   By: Dorise Bullion III M.D   On: 06/27/2016 07:21   Dg Chest Port 1 View  Result Date: 06/26/2016 CLINICAL DATA:  Shortness of breath. EXAM:  PORTABLE CHEST 1 VIEW COMPARISON:  Radiograph of Jun 25, 2016. FINDINGS: Stable cardiomegaly. Increased bilateral diffuse interstitial and basilar opacities are noted concerning for worsening edema. No pneumothorax is noted. Mild left pleural effusion is noted with associated atelectasis or infiltrate. Right-sided PICC line is unchanged in position. Nasogastric tube is unchanged in position. Bony thorax is unremarkable. IMPRESSION: Increased bilateral diffuse interstitial densities consistent with edema. Mild left pleural effusion is noted with associated atelectasis or infiltrate. Electronically Signed   By: Marijo Conception, M.D.   On: 06/26/2016 07:40   Dg Chest Port 1 View  Result Date: 06/25/2016 CLINICAL DATA:  Shortness of Breath EXAM: PORTABLE CHEST 1 VIEW COMPARISON:  06/24/2016 FINDINGS: Cardiac shadow is prominent but stable. Right-sided PICC line and nasogastric catheter are again seen and stable. Bibasilar atelectatic changes are noted worse on the left than the right and increased from the prior exam. Small left pleural effusion is noted as well. Mild vascular congestion is seen. IMPRESSION: Increasing bibasilar infiltrates left greater than right. Mild vascular congestion is noted. The Electronically Signed   By: Inez Catalina M.D.   On: 06/25/2016 07:54   Dg Chest Port 1 View  Result Date: 06/24/2016 CLINICAL DATA:  Shortness of Breath EXAM: PORTABLE CHEST 1 VIEW COMPARISON:  06/23/2016 FINDINGS: Right-sided PICC line is again noted at the cavoatrial junction. Nasogastric catheter is noted in satisfactory position through the overall inspiratory effort is decreased from the prior exam. Mild left basilar atelectasis remains. No other focal abnormality is noted. IMPRESSION: Stable left basilar atelectasis. Electronically Signed   By: Inez Catalina M.D.   On: 06/24/2016 08:07   Dg Chest Port 1 View  Result Date: 06/23/2016 CLINICAL DATA:  61 year old Graham with shortness of breath and  abdominal pain. 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 Being treated conservatively at this time, including with bowel rest. EXAM: PORTABLE CHEST 1 VIEW COMPARISON:  Chest radiographs 06/22/2016 and earlier. CTA 05/Haley/2018 FINDINGS: Portable AP semi upright view at 0754 hours. Stable enteric tube which courses to the abdomen. Stable right PICC line. Mildly larger lung volumes and decreased streaky bibasilar opacity. Mild residual. No pneumothorax or pulmonary edema. No pleural effusion or consolidation. Stable cardiac size and mediastinal contours. IMPRESSION: 1.  Stable lines and tubes. 2. Mildly improved lung volumes and regressed bibasilar opacity favored to be atelectasis. Electronically Signed   By: Genevie Ann M.D.   On: 06/23/2016 08:Haley   Dg Chest Port 1 View  Result Date: 06/22/2016 CLINICAL DATA:  61 year old Graham with history of abdominal aortic aneurysm. Shortness of breath and vomiting. EXAM: PORTABLE CHEST 1 VIEW COMPARISON:  Chest x-ray 06/21/2016. FINDINGS: There is a right upper extremity PICC with tip terminating in the superior cavoatrial junction. A nasogastric tube is seen extending into the stomach, however, the tip of the nasogastric tube extends below the lower margin of the image. Lung volumes are low. Linear bibasilar opacities favored to reflect areas of subsegmental atelectasis, however, underlying airspace consolidation from infection or aspiration is not excluded. No pleural effusions. No evidence of pulmonary edema. Heart size is normal. Upper mediastinal contours are within normal limits. IMPRESSION: 1. Support apparatus, as above. 2. Low lung volumes with bibasilar areas of atelectasis and/or airspace consolidation. Electronically Signed   By: Vinnie Langton M.D.   On: 06/22/2016 07:30   Dg Chest Port 1 View  Result Date: 06/21/2016 CLINICAL DATA:  Aortic  dissection EXAM: PORTABLE CHEST 1 VIEW COMPARISON:  05/Haley/2018 FINDINGS: Bibasilar atelectasis. Heart is normal size. No effusions or acute bony abnormality. IMPRESSION: Bibasilar atelectasis. Electronically Signed   By: Rolm Baptise M.D.   On: 06/21/2016 07:18   Dg Chest Port 1 View  Result Date: 5/Haley/2018 CLINICAL DATA:  Hypoxia, unresponsive EXAM: PORTABLE CHEST 1 VIEW COMPARISON:  CTA chest dated 05/Haley/2018 FINDINGS: Lungs are essentially clear. No focal consolidation. No pleural effusion or pneumothorax. The heart is top-normal in size. IMPRESSION: No evidence of acute cardiopulmonary disease. Electronically Signed   By: Julian Hy M.D.   On: 05/Haley/2018 17:01   Dg Chest Port 1 View  Result Date: 5/Haley/2018 CLINICAL DATA:  Sudden onset sharp substernal chest pain that radiates to the back. Nausea, dizziness and shortness of breath. EXAM: PORTABLE CHEST 1 VIEW COMPARISON:  None. FINDINGS: Trachea is midline. Heart size is accentuated by AP supine technique. Probable mild scarring in the right middle lobe and lingula. Lungs are otherwise clear. No pleural fluid. IMPRESSION: No acute findings. Electronically Signed   By: Lorin Picket M.D.   On: 05/Haley/2018 09:56   Dg Abd Portable 1v  Result Date: 06/27/2016 CLINICAL DATA:  Ileus. EXAM: PORTABLE ABDOMEN - 1 VIEW COMPARISON:  Jun 26, 2016 FINDINGS: The NG tube terminates within the stomach. The bowel gas pattern is unremarkable on today's study. No other acute abnormalities. IMPRESSION: The NG tube terminates within the stomach. The bowel gas pattern is unremarkable. Electronically Signed   By: Dorise Bullion III M.D   On: 06/27/2016 07:Haley   Dg Abd Portable 1v  Result Date: 06/26/2016 CLINICAL DATA:  Shortness of breath, abdominal pain EXAM: PORTABLE ABDOMEN - 1 VIEW COMPARISON:  06/25/2016 FINDINGS: NG tube tip is in the distal stomach. Prior cholecystectomy. Nonobstructive bowel gas pattern. No free air organomegaly. IMPRESSION: NG tube  tip in the distal stomach.  No acute findings. Electronically Signed   By: Rolm Baptise M.D.   On: 06/26/2016 07:36   Dg Abd Portable 1v  Result Date: 06/25/2016 CLINICAL DATA:  Shortness of breath.  Abdominal pain. EXAM: PORTABLE ABDOMEN - 1 VIEW COMPARISON:  Jun 24, 2016 FINDINGS: The distal tip of the NG tube is near the gastric antrum. Mild opacity in left lung base will be better assessed on today's chest x-ray. No free air or portal venous gas identified although evaluation is limited on supine imaging. No bowel obstruction. IMPRESSION: No interval change or acute abnormality in the abdomen. Electronically Signed   By: Dorise Bullion III M.D   On: 06/25/2016 07:54   Dg Abd Portable 1v  Result Date: 06/24/2016 CLINICAL DATA:  Abdominal distention. EXAM: PORTABLE ABDOMEN - 1 VIEW COMPARISON:  06/23/2016 . FINDINGS: NG tube noted in stable position with tip in the stomach . Surgical clips right upper quadrant. Several nonspecific loops of air-filled small bowel again noted. Colonic gas pattern is normal. No free air. IMPRESSION: 1.  NG tube in stable position. 2. Several nonspecific loops of air-filled small bowel again noted. No evidence of progressive bowel distention. Colonic gas pattern normal. No free air. Electronically Signed   By: Marcello Moores  Register   On: 06/24/2016 08:06   Dg Abd Portable 1v  Result Date: 06/23/2016 CLINICAL DATA:  61 year old Graham with shortness of breath and abdominal pain. 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 Being treated conservatively at this time, including with bowel rest. EXAM: PORTABLE ABDOMEN - 1 VIEW COMPARISON:  Abdominal radiographs 06/22/2016. CTA 05/Haley/2018, and earlier. FINDINGS: Portable AP supine view at 0759 hours. Stable NG tube, side hole to level of the gastric body and tip at the level of the gastric antrum. Stable cholecystectomy  clips. Stable bowel gas pattern with several gas containing nondilated small and large bowel loops. No definite pneumoperitoneum on this supine view. Abdominal and pelvic visceral contours appear stable. No acute osseous abnormality identified. IMPRESSION: 1. Stable NG tube position. 2. Stable, nonobstructed bowel-gas pattern. Electronically Signed   By: Genevie Ann M.D.   On: 06/23/2016 08:18   Dg Abd Portable 1v  Result Date: 06/22/2016 CLINICAL DATA:  Abdominal pain for few days with nausea and vomiting. EXAM: PORTABLE ABDOMEN - 1 VIEW COMPARISON:  06/22/2016 abdominal radiograph. FINDINGS: Enteric tube loops in the gastric fundus and terminates in the body of the stomach, without appreciable kink. Top-normal caliber small bowel loops throughout the abdomen. Minimal colonic stool. No evidence of pneumatosis or pneumoperitoneum. No radiopaque urolithiasis. Cholecystectomy clips are seen in the right upper quadrant of the abdomen. Patchy opacities at the lung bases. IMPRESSION: 1. Enteric tube loops in the gastric fundus and terminates in the body of the stomach without appreciable kink. 2. Top-normal caliber small bowel loops, unchanged. Haley. Patchy bibasilar lung opacities, correlate with chest radiograph. Electronically Signed   By: Ilona Sorrel M.D.   On: 06/22/2016 Haley:Haley   Dg Abd Portable 1v  Result Date: 06/22/2016 CLINICAL DATA:  Vomiting.  Shortness of breath.  AAA . EXAM: PORTABLE ABDOMEN - 1 VIEW COMPARISON:  06/21/2016. FINDINGS: Surgical clips right upper quadrant. NG tube noted with tip over the distal stomach. No bowel distention. No acute bony abnormality identified. Mild basilar atelectasis. IMPRESSION: NG tube noted with its tip over the distal stomach. No bowel distention or acute intra-abdominal abnormality identified. Electronically Signed   By:  [place]   On: 06/22/2016 07:30   Dg Abd Portable 1v  Result Date: 06/21/2016 CLINICAL DATA:  Feeding tube placement EXAM: PORTABLE  ABDOMEN - 1 VIEW COMPARISON:  06/21/2016 FINDINGS: NG tube coils in the fundus of the stomach with the tip in the distal stomach. Decompression of the stomach. IMPRESSION: NG tube tip in the distal stomach. Electronically Signed   By: Rolm Baptise M.D.   On: 06/21/2016 15:46   Dg Abd Portable 1v  Result Date: 06/21/2016 CLINICAL DATA:  Ileus  Vomiting that started this AM per patient EXAM: PORTABLE ABDOMEN - 1 VIEW COMPARISON:  05/Haley/2018 FINDINGS: Gaseous distention of the stomach. Paucity of small bowel and colonic gas. Cholecystectomy clips. Linear scarring/ atelectasis in the lung bases. IMPRESSION: 1. Gaseous distention of the stomach. Electronically Signed   By: Lucrezia Europe M.D.   On: 06/21/2016 11:54   Dg Abd Portable 1v  Result Date: 5/Haley/2018 CLINICAL DATA:  Aortic dissection EXAM: PORTABLE ABDOMEN - 1 VIEW COMPARISON:  CT abdomen/ pelvis dated 05/Haley/2018 FINDINGS: Nonobstructive bowel gas pattern. Visualized osseous structures are within normal limits. IMPRESSION: Unremarkable abdominal radiograph. Electronically Signed   By: Julian Hy M.D.   On: 05/Haley/2018 17:02   Ct Angio Chest/abd/pel For Dissection W And/or W/wo  Result Date: 06/27/2016 CLINICAL DATA:  61 year old Graham with a history of type B dissection EXAM: CT ANGIOGRAPHY CHEST, ABDOMEN AND PELVIS TECHNIQUE: Multidetector CT imaging through the chest, abdomen and pelvis was performed using the standard protocol during bolus administration of intravenous contrast. Multiplanar reconstructed images and MIPs were obtained and reviewed to evaluate the vascular anatomy. CONTRAST:  100 cc Isovue 370 COMPARISON:  CT 05/Haley/2018 FINDINGS: CTA CHEST FINDINGS Cardiovascular: Heart: Heart size unchanged. No pericardial fluid/ thickening. No significant coronary calcifications. Aorta: Re- demonstration of type B dissection with the entry tear appearing to originate just beyond the origin of the left subclavian artery. Branch vessels remain  patent without extension of the dissection flap into the branch vessels. Caliber and contour of the ascending aorta unremarkable without evidence of retrograde extension. Greatest diameter of the ascending aorta 2.9 cm. Inflammatory changes surrounding the distal aortic arch in the proximal descending aorta. Greatest diameter of the distal aortic arch measures approximately Haley.4 cm. Greatest diameter on the comparison CT approximately Haley.0 cm. No aneurysm of the descending thoracic aorta with the greatest diameter of the false lumen measuring 2.7 cm. True lumen is compressed. There is a fenestration in the distal true lumen at the level of the diaphragm just above the aortic hiatus. Pulmonary arteries: No lobar, segmental, or proximal subsegmental filling defects. Mediastinum/Nodes: No mediastinal hemorrhage. Mediastinal lymph nodes are present. Gastric tube within the esophagus. Lungs/Pleura: Ground-glass opacities developing through the the bilateral lungs. Developing interlobular septal thickening. Atelectasis of the medial segment left lower lobe. Small low-density left pleural effusion trace right-sided pleural effusion and associated atelectasis. No pneumothorax. Right upper extremity PICC appears to terminate superior vena cava. Review of the MIP images confirms the above findings. CTA ABDOMEN AND PELVIS FINDINGS VASCULAR Aorta: Re- demonstration of dissection flap of the abdominal aorta. No aneurysm.  No periaortic fluid of the abdomen. The true lumen is compressed throughout the abdomen. True lumen contributes to celiac artery origin, superior mesenteric artery origin, and also continues across the origin of the inferior mesenteric artery. The false lumen contributes to perfusion of the right renal artery. The lateral margin of the dissection flap involves the origin of 2 left renal arteries both superior and  inferior. True lumen is decompressed in the inferior aorta extending into the bilateral iliac  arteries. The bilateral common iliac arteries appear perfused from the false lumen bilaterally. False lumen extends in the bilateral external iliac artery. Likely re- entry tear of distal external iliac arteries bilaterally, on the right image 173, on the left image 171. Dissection flap extends into the bilateral hypogastric arteries which are partially patent with decreased flow. Celiac: Origin of the celiac artery originates from the true lumen which is decompressed. Celiac artery remains patent at this time including the branch vessels. Typical branch pattern of splenic artery, left gastric artery, common hepatic artery. SMA: Superior mesenteric artery origin originates from the true lumen which is compressed. SMA remains patent at this time. Renals: Right renal artery originates from the false lumen. Right renal artery is perfusing at this time with uniform perfusion of the right kidney. There are 2 left renal arteries, superior and inferior. Both arteries originate near the margin of the dissection flap. The superior left renal artery is partially filling, at the in flexion point of the dissection flap, perfusing superior kidney. The inferior left renal artery appears thrombosed, new from the comparison with enlarging left renal infarction, predominantly of the lower pole. IMA: Origin of the inferior mesenteric artery is thrombosed given the collapsed true lumen at this level. There is re- constitution of the distal inferior mesenteric artery via collateral flow. Right lower extremity: False lumen perfuses the right common iliac artery with collapse of the true lumen. Dissection flap extends into the hypogastric artery, with the distal pelvic branch is partially opacifying. External iliac artery is perfusing from the false lumen, with unremarkable appearance of the distal external iliac artery, common femoral artery, profunda femoris, SFA. There is the appearance of a re- entry tear in the mid right external  iliac artery. Left lower extremity: False lumen perfuses the left common iliac artery with collapse of the true lumen. Dissection extends into the hypogastric artery which is partially perfused with opacification of pelvic vessels. Distal external iliac artery an the common femoral artery unremarkable, with patent proximal femoral vessels. There is the appearance of a re- entry tear in the mid left external iliac artery. Veins: Unremarkable appearance of the venous system. Review of the MIP images confirms the above findings. NON-VASCULAR Hepatobiliary: Unremarkable appearance of the liver. Cholecystectomy Pancreas: Unremarkable appearance of the pancreas. No pericholecystic fluid or inflammatory changes. Unremarkable ductal system. Spleen: Unremarkable. Adrenals/Urinary Tract: Right adrenal gland unremarkable. Nodule of the left adrenal gland which measures 2.7 cm. Right: Right-sided kidney perfuses uniformly with no hydronephrosis. Left: Progressive left renal infarct with small portion of the superior kidney perfusing. The remainder of the left kidney is hypoperfused, progressed from the comparison. No hydronephrosis. Urinary catheter within the urinary bladder. Stomach/Bowel: Unremarkable appearance of stomach with gastric tube in place. Small bowel borderline dilated and fluid-filled. No transition point. The wall of the small bowel relatively uniformly thin and enhancing, although the study is timed for the arterial phase. No focal wall thickening. No abnormally distended colon. No transition point. Fluid filled colon. No focal wall thickening or pericolonic inflammatory changes. Lymphatic: Multiple lymph nodes in the para-aortic nodal station, none of which are enlarged. Mesenteric: No free fluid or air. No adenopathy. Reproductive: Hysterectomy Other: No hernia. Musculoskeletal: No displaced fracture. Degenerative changes of the spine. 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 Only 1 possible fenestration is identified, in the distal  thoracic aorta above the aortic hiatus. There is enlarging diameter of the distal aortic arch with associated inflammatory changes, measuring approximately Haley.5 on today's study compared to approximately Haley.1 cm previously. The appearance is concerning for progression/expansion of intramural hematoma. Progressing left renal infarction, with near complete thrombosis of superior and inferior renal arteries, both of which originate at the margin of the dissection flap. Bilateral common iliac arteries appear to be perfused from the false lumen, with complete collapse of true lumen proximally. There does appear to be re- entry to the true lumen in the mid external iliac artery bilaterally, as there is unremarkable appearance of the proximal femoral vasculature including bilateral common iliac arteries. The Haley mesenteric vessels originate from the small true lumen, which is progressively compressed. Celiac artery and superior mesenteric artery remain patent, with occlusion at the origin of the inferior mesenteric artery. Three vessel arch, with all Haley branches remain patent. No evidence of extension of the dissection flap into the branch vessels. These preliminary results were discussed by telephone at the time of interpretation on 06/27/2016 at 12:41 pm with Dr. Curt Jews. Borderline dilated small bowel without transition point or obstruction. Findings may represent ileus and/or nonspecific enteritis. No focal wall thickening to suggest Haley Graham ischemia, although the timing of this CTA is specific for arterial evaluation and not the bowel tissues. If ongoing concern for bowel ischemia, would repeat abd/pelvis contrast enhanced-CT portal venous phase. Similar appearance of mixed geographic ground-glass opacities of the bilateral lungs with mild interlobular septal thickening. Again, differential diagnosis includes  pulmonary edema, ARDS, atypical infection. Signed, Dulcy Fanny. Earleen Newport, DO [facility] Radiology Electronically Signed   By: Corrie Mckusick D.O.   On: 06/27/2016 13:18   Ct Angio Chest/abd/pel For Dissection W And/or W/wo  Result Date: 5/Haley/2018 CLINICAL DATA:  Substernal chest pain radiating to back EXAM: CT ANGIOGRAPHY CHEST, ABDOMEN AND PELVIS TECHNIQUE: Multidetector CT imaging through the chest, abdomen and pelvis was performed using the standard protocol during bolus administration of intravenous contrast. Multiplanar reconstructed images and MIPs were obtained and reviewed to evaluate the vascular anatomy. CONTRAST:  100 cc Isovue 370 IV COMPARISON:  CT abdomen and pelvis 10/27/2011 FINDINGS: CTA CHEST FINDINGS Cardiovascular: There is the type B aortic dissection beginning in the distal aortic arch just beyond the origin of the great vessels. The true lumen is compressed by the false lumen. No aneurysm. No pulmonary embolus. Heart is normal size. Mediastinum/Nodes: No mediastinal, hilar, or axillary adenopathy. Lungs/Pleura: Atelectasis or scarring in the lingula. Lungs otherwise clear. No effusions. Musculoskeletal: No acute bony abnormality. Review of the MIP images confirms the above findings. CTA ABDOMEN AND PELVIS FINDINGS VASCULAR Aorta: Dissection continues throughout the abdominal aorta with the celiac artery and superior mesenteric artery arising from the true lumen anteriorly. The dissection may extend into the left renal artery where there is only a small amount of blood flow noted in the proximal and mid left renal artery. Right renal artery appears to arise from the false lumen and is patent. The the true lumen appears thrombosed below the origin of the superior mesenteric artery. Inferior mesenteric artery arises from the thrombosed true lumen and appears occluded proximally, reconstitutes several cm from the origin. Celiac: Patent. SMA: Patent.  Renals: As above. IMA: As above. Inflow: To the dissection continues into both common iliac arteries with thrombosed true lumens. The dissection appears to terminate at approximately the level of the common iliac bifurcation bilaterally. Veins: Grossly patent and unremarkable.  Review of the MIP images confirms the above findings. NON-VASCULAR Hepatobiliary: Mild diffuse fatty infiltration. Prior cholecystectomy. Pancreas: No focal abnormality or ductal dilatation. Spleen: No focal abnormality.  Normal size. Adrenals/Urinary Tract: Nodules in the left adrenal gland are low-density on the precontrast imaging compatible with small adenomas. There are areas of non perfusion noted in the mid and lower poles of the left kidney compatible with infarction, likely related to the involvement of the left renal artery by dissection. Urinary bladder unremarkable. Stomach/Bowel: Stomach, large and small bowel grossly unremarkable. Lymphatic: No adenopathy. Reproductive: Prior hysterectomy.  No adnexal masses. Other: No free fluid or free air. Musculoskeletal: No acute bony abnormality. Review of the MIP images confirms the above findings. 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 This involves the descending thoracic aorta, abdominal aorta and iliac vessels. The true lumen is compressed by the larger false lumen, and is thrombosed below the SMA/renal artery origins. The dissection appears to extend into the left renal artery with partial occlusion and areas of infarct in the mid and lower pole of the left kidney. Fatty infiltration of the liver. Critical Value/emergent results were called by telephone at the time of interpretation on 5/Haley/2018 at 10:13 am to Dr. Fredia Sorrow , who verbally acknowledged these results. Electronically Signed   By: Rolm Baptise M.D.   On: 05/Haley/2018 10:15   Anti-infectives: Anti-infectives    Start     Dose/Rate Route  Frequency Ordered Stop   06/26/16 0830  meropenem (MERREM) 1 g in sodium chloride 0.9 % 100 mL IVPB     1 g 200 mL/hr over 30 Minutes Intravenous Every 8 hours 06/26/16 0810     06/22/16 1800  clindamycin (CLEOCIN) IVPB 600 mg  Status:  Discontinued     600 mg 100 mL/hr over 30 Minutes Intravenous Every 8 hours 06/22/16 1723 06/26/16 0810   06/22/16 1000  azithromycin (ZITHROMAX) 250 mg in dextrose 5 % 125 mL IVPB  Status:  Discontinued     250 mg 125 mL/hr over 60 Minutes Intravenous Every 24 hours 06/22/16 0826 06/27/16 1019   06/22/16 0900  levofloxacin (LEVAQUIN) IVPB 500 mg  Status:  Discontinued     500 mg 100 mL/hr over 60 Minutes Intravenous Every 24 hours 06/22/16 0759 06/22/16 0824      Assessment/Plan: s/p * No surgery found * Still with issues regarding high blood pressure despite IV drips. White blood cell trending downward. Continue support   LOS: 9 days   Haley Graham, Todd 06/29/2016, 8:Haley AM

## 2016-06-29 NOTE — Progress Notes (Signed)
[facility] ADULT ICU REPLACEMENT PROTOCOL FOR AM LAB REPLACEMENT ONLY  The patient does apply for the [facility] Adult ICU Electrolyte Replacment Protocol based on the criteria listed below:   1. Is GFR >/= 40 ml/min? Yes.    Patient's GFR today is >44 2. Is urine output >/= 0.5 ml/kg/hr for the last 6 hours? Yes.   Patient's UOP is 0.63 ml/kg/hr 3. Is BUN < 60 mg/dL? Yes.    Patient's BUN today is 56 4. Abnormal electrolyte(s): k 3.2 5. Ordered repletion with: protocol 6. If a panic level lab has been reported, has the CCM MD in charge been notified? No..   Physician:    Ronda Fairly A 06/29/2016 5:15 AM

## 2016-06-29 NOTE — Progress Notes (Signed)
PULMONARY / CRITICAL CARE MEDICINE   Name: Haley Graham MRN: [id number] DOB: 02-Apr-1955    ADMISSION DATE:  06/20/2016 CONSULTATION DATE:  06/26/16  REFERRING MD:  Dr. 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  Course complicated by hypoxia from pneumonia, ileus, AKI.  SUBJECTIVE:  Breathing easier.  Denies chest pain.  VITAL SIGNS: BP (!) 129/56   Pulse 71   Temp 98.2 F (36.8 C) (Oral)   Resp (!) 23   Ht 5\' 3"  (1.6 m)   Wt 187 lb 6.3 oz (85 kg)   SpO2 99%   BMI 33.19 kg/m   INTAKE / OUTPUT: I/O last 3 completed shifts: In: 5798.6 [I.V.:4958.6; NG/GT:190; IV Piggyback:650] Out: 5643 [Urine:5475; Emesis/NG output:1400]  PHYSICAL EXAMINATION:  General - pleasant Eyes - pupils reactive ENT - no sinus tenderness, no oral exudate, no LAN Cardiac - regular, no murmur Chest - no wheeze, rales Abd - soft, non tender Ext - no edema Skin - no rashes Neuro - normal strength Psych - normal mood  LABS:  BMET  Recent Labs Lab 06/27/16 0340 06/28/16 0347 06/29/16 0336  NA 137 139 143  K 4.0 3.4* 3.2*  CL 106 109 112*  CO2 22 22 22   BUN 46* 55* 56*  CREATININE 1.11* 1.35* 1.30*  GLUCOSE 168* 169* 150*    Electrolytes  Recent Labs Lab 06/26/16 0535  06/27/16 0340 06/28/16 0347 06/29/16 0336  CALCIUM 8.7*  < > 8.8* 8.6* 8.5*  MG 1.8  --  1.9  --  1.9  PHOS 4.3  --  4.3  --  4.4  < > = values in this interval not displayed.  CBC  Recent Labs Lab 06/27/16 0340 06/28/16 0347 06/29/16 0336  WBC 36.7* 30.6* 22.9*  HGB 11.8* 11.6* 11.2*  HCT 35.3* 34.6* 34.4*  PLT 103* 138* 152    Coag's No results for input(s): APTT, INR in the last 168 hours.  Sepsis Markers  Recent Labs Lab 06/26/16 1553 06/27/16 0340 06/28/16 0347  LATICACIDVEN 1.5 1.3 1.5    ABG  Recent Labs Lab 06/24/16 0332  06/26/16 1512 06/28/16 1615  PHART 7.430 7.429 7.430  PCO2ART 33.1 32.3 31.2*  PO2ART 68.9* 66.0* 64.0*    Liver Enzymes  Recent Labs Lab 06/27/16 0340 06/28/16 0347 06/29/16 0336  AST 55* 56* 52*  ALT 125* 95* 73*  ALKPHOS 128* 146* 133*  BILITOT 0.8 0.9 1.0  ALBUMIN 2.2* 1.9* 1.9*    Cardiac Enzymes No results for input(s): TROPONINI, PROBNP in the last 168 hours.  Glucose  Recent Labs Lab 06/28/16 1215 06/28/16 1559 06/28/16 1937 06/29/16 0007 06/29/16 0353 06/29/16 0757  GLUCAP 204* 162* 164* 190* 144* 165*    Imaging Dg Chest Port 1 View  Result Date: 06/29/2016 CLINICAL DATA:  Shortness of Breath EXAM: PORTABLE CHEST 1 VIEW COMPARISON:  06/28/2016 FINDINGS: Nasogastric catheter is noted coiled within the stomach. Right-sided PICC line is seen in the distal superior vena cava. Cardiac shadow is mildly enlarged but stable. Bilateral vascular congestion and edema is seen stable from prior exam. Some atelectasis remains in the left retrocardiac region. IMPRESSION: No significant interval change from the prior exam. Electronically Signed   By: Inez Catalina M.D.   On: 06/29/2016 07:36     STUDIES:  CTA Chest, ABD, Pelvis 07-04-22 >> Type B aortic dissection  beginning just beyond the origin of the great vessels from the aortic arch.  This involves the descending thoracic aorta, abdominal aorta and iliac vessels. The true lumen is compressed by the larger false lumen, and is thrombosed below the SMA/renal artery origins. The dissection appears to extend into the left renal artery with partial occlusion and areas of infarct in the mid and lower pole of the left kidney.  CT Chest w/o 5/25 >> diffuse bilateral patchy airspace disease, mutli-segment atelectasis at the bases & small pleural effusions, ? Infiltrate LLL  CT ABD/Pelvis 5/25 >> diffuse small bowel & colonic distention with fluid levels as seen with ileus, ? Underlying bowel ischemia given patients known aortic  dissection with proximal IMA occlusion and SMA flow via the narrow true lumen  CT CHEST /ABd/Pelvis 5/26>Type B aortic dissection, c/by left renal artery compromise/renal infarction. Progression on left renal infarct. enlarging aortic arch diameter ?progression of intramural hematoma , Messenteric vessels progressively compressed, w/ occlusion of IMA. Dilated small bowel w/out obstruction ? Ileus. Similar GGO in bilateral lungs.   CULTURES: BCx2 5/21 >> NEG  UC 5/25 >> negative BC 5/25 >  ANTIBIOTICS: Azithro 5/21 >> 5/25  Clinda 5/21 >> 5/25 Meropenem 5/25 >>   SIGNIFICANT EVENTS: 5/19  Admit with sudden sharp chest pain > Type B Dissection   LINES/TUBES: RUE PICC 5/20 >>   DISCUSSION: 61 y/o F admitted with Type B aortic dissection.  Course complicated by ileus, hypoxia with pneumonia.  ASSESSMENT / PLAN:  Acute hypoxic respiratory failure from PNA, pulmonary edema. Tobacco abuse. - oxygen to keep SpO2 > 92% - f/u CXR - scheduled BDs - continue Abx  Type B aortic dissection. Hx of HTN. - cont catapres patch, lasix, lopressor, NTG patch, esmolol gtt  Acute renal failure. Hyperkalemia.>resolved  Hypokalemia  - monitor renal f/u - f/u BMET - K + replaced.   Ileus.CT abd/pelvis - dilated small bowel w/o obstruction  - continueTPN   Thrombocytopenia.-improving  - f/u CBC  DM type II. - SSI  Anxiety. Pain control. - prn ativan - duragesic  DVT prophylaxis - SCDs SUP - protonix Nutrition - TNA Goals of care - full code  Updated husband  Chesley Mires, MD [facility] Pulmonary/Critical Care 06/29/2016, 9:48 AM Pager:  [phone] After 3pm call: [phone]

## 2016-06-29 NOTE — Progress Notes (Signed)
Patient ID: Haley Graham, female   DOB: 19-Jul-1955, 61 y.o.   MRN: [id number] [facility] DAILY ICU PROGRESS NOTE                   [address]            [place] [address]          [phone]       Total Length of Stay:  LOS: 9 days   Subjective: Less confused this am  Objective: Vital signs in last 24 hours: Temp:  [96.5 F (35.8 C)-98.4 F (36.9 C)] 98.2 F (36.8 C) (05/28 0700) Pulse Rate:  [67-82] 71 (05/28 0700) Cardiac Rhythm: Normal sinus rhythm (05/27 2100) Resp:  [14-27] 23 (05/28 0700) BP: (86-155)/(40-97) 129/56 (05/28 0700) SpO2:  [89 %-100 %] 99 % (05/28 0828) Weight:  [187 lb 6.3 oz (85 kg)] 187 lb 6.3 oz (85 kg) (05/28 0400)  Filed Weights   06/27/16 0330 06/28/16 0600 06/29/16 0400  Weight: 192 lb 14.4 oz (87.5 kg) 188 lb 15 oz (85.7 kg) 187 lb 6.3 oz (85 kg)    Weight change: -1 lb 8.7 oz (-0.7 kg)   Hemodynamic parameters for last 24 hours:    Intake/Output from previous day: 05/27 0701 - 05/28 0700 In: 3890.9 [I.V.:3240.9; NG/GT:100; IV Piggyback:550] Out: 9563 [Urine:3925; Emesis/NG output:950]  Intake/Output this shift: Total I/O In: -  Out: 225 [Urine:225]  Current Meds: Scheduled Meds: . albuterol  2.5 mg Inhalation TID  . arformoterol  15 mcg Nebulization BID  . bisacodyl  10 mg Rectal q morning - 10a  . budesonide (PULMICORT) nebulizer solution  0.5 mg Nebulization BID  . chlorhexidine  15 mL Mouth Rinse BID  . Chlorhexidine Gluconate Cloth  6 each Topical Daily  . cloNIDine  0.3 mg Transdermal Weekly  . fentaNYL  50 mcg Transdermal Q72H  . furosemide  40 mg Intravenous Daily  . insulin aspart  0-24 Units Subcutaneous Q4H  . insulin glargine  35 Units Subcutaneous BID  . mouth rinse  15 mL Mouth Rinse q12n4p  . metoprolol tartrate  10 mg Intravenous Q4H  . nitroGLYCERIN  0.4 mg Transdermal Daily  . pantoprazole (PROTONIX) IV  40 mg Intravenous Q24H  . sodium chloride flush  10-40 mL Intracatheter Q12H    Continuous Infusions: . Marland KitchenTPN (CLINIMIX-E) Adult 83 mL/hr at 06/29/16 0400  . Marland KitchenTPN (CLINIMIX-E) Adult    . sodium chloride    . esmolol 300 mcg/kg/min (06/29/16 0817)  . meropenem (MERREM) IV 1 g (06/29/16 0817)  . potassium chloride 10 mEq (06/29/16 0816)  . potassium chloride     PRN Meds:.Place/Maintain arterial line **AND** sodium chloride, acetaminophen **OR** acetaminophen, albuterol, hydrALAZINE, HYDROmorphone (DILAUDID) injection, LORazepam, ondansetron (ZOFRAN) IV, pneumococcal 23 valent vaccine, promethazine, sodium chloride flush  General appearance: cooperative, appears older than stated age and no distress Neurologic: intact Heart: regular rate and rhythm, S1, S2 normal, no murmur, click, rub or gallop Lungs: diminished breath sounds bibasilar Abdomen: abdomen less distended, has bowel sounds today  Extremities: dp pt pulses intact bilaterial  Lab Results: CBC: Recent Labs  06/28/16 0347 06/29/16 0336  WBC 30.6* 22.9*  HGB 11.6* 11.2*  HCT 34.6* 34.4*  PLT 138* 152   BMET:  Recent Labs  06/28/16 0347 06/29/16 0336  NA 139 143  K 3.4* 3.2*  CL 109 112*  CO2 22 22  GLUCOSE 169* 150*  BUN 55* 56*  CREATININE 1.35* 1.30*  CALCIUM 8.6* 8.5*  CMET: Lab Results  Component Value Date   WBC 22.9 (H) 06/29/2016   HGB 11.2 (L) 06/29/2016   HCT 34.4 (L) 06/29/2016   PLT 152 06/29/2016   GLUCOSE 150 (H) 06/29/2016   TRIG 167 (H) 06/29/2016   ALT 73 (H) 06/29/2016   AST 52 (H) 06/29/2016   NA 143 06/29/2016   K 3.2 (L) 06/29/2016   CL 112 (H) 06/29/2016   CREATININE 1.30 (H) 06/29/2016   BUN 56 (H) 06/29/2016   CO2 22 06/29/2016      PT/INR: No results for input(s): LABPROT, INR in the last 72 hours. Radiology: Dg Chest Port 1 View  Result Date: 06/29/2016 CLINICAL DATA:  Shortness of Breath EXAM: PORTABLE CHEST 1 VIEW COMPARISON:  06/28/2016 FINDINGS: Nasogastric catheter is noted coiled within the stomach. Right-sided PICC line is seen in the  distal superior vena cava. Cardiac shadow is mildly enlarged but stable. Bilateral vascular congestion and edema is seen stable from prior exam. Some atelectasis remains in the left retrocardiac region. IMPRESSION: No significant interval change from the prior exam. Electronically Signed   By: Inez Catalina M.D.   On: 06/29/2016 07:36     Assessment/Plan: S/P  Mobilize continue tna, supportive care Wbc trending down     Haley Graham 06/29/2016 8:44 AM

## 2016-06-29 NOTE — Progress Notes (Signed)
Physical Therapy Treatment Patient Details Name: Haley Graham MRN: [id number] DOB: 1955/09/05 Today's Date: 06/29/2016    History of Present Illness Pt adm with type B aortic dissection. Treated non surgically with BP control and pain control. Pt developed pancreatitis and ileus with NG tube placed. PMH - anxiety, arthritis    PT Comments    Patient seen for mobility progression and OOB activity. At this time, patient is extremely limited with all aspects of mobility. Tolerated EOB and OOB to chair, only able to mobilize 23ft in room with increased physical assist. This is a decline over previous sessions. Ambulated on 6 liters, saturations, HR (70s) and BP (174Y systolic) stable but patient appears withdrawn, minimally verbal and limited with activity. Will continue to assess for disposition needs as likely will need to consider ST rehab if patient continues to digress.    Follow Up Recommendations  Home health PT;Supervision/Assistance - 24 hour ([facility] aide)     Equipment Recommendations  3in1 (PT);Rolling walker with 5" wheels    Recommendations for Other Services       Precautions / Restrictions Precautions Precautions: Fall Precaution Comments: NG to suction Restrictions Weight Bearing Restrictions: No    Mobility  Bed Mobility Overal bed mobility: Needs Assistance Bed Mobility: Supine to Sit     Supine to sit: Mod assist     General bed mobility comments: Moderate assist to bring LEs to EOB and elevate trunk to upright  Transfers Overall transfer level: Needs assistance Equipment used: Rolling walker (2 wheeled) Transfers: Sit to/from Stand Sit to Stand: Mod assist         General transfer comment: VCs for hand placement, moderate assist to power up to standing, assist to maintain stability  Ambulation/Gait Ambulation/Gait assistance: Min assist;+2 safety/equipment Ambulation Distance (Feet): 7 Feet Assistive device: Rolling walker (2 wheeled) Gait  Pattern/deviations: Step-to pattern;Decreased stride length;Shuffle;Trunk flexed Gait velocity: decreased Gait velocity interpretation: <1.8 ft/sec, indicative of risk for recurrent falls General Gait Details: patient very limited with step and stride, Max cues to encourage mobility. Limited to 7 ft in room, very unsteady with movement   Stairs            Wheelchair Mobility    Modified Rankin (Stroke Patients Only)       Balance Overall balance assessment: Needs assistance   Sitting balance-Leahy Scale: Good     Standing balance support: Bilateral upper extremity supported Standing balance-Leahy Scale: Poor Standing balance comment: due to weakness                            Cognition Arousal/Alertness: Awake/alert Behavior During Therapy: Flat affect Overall Cognitive Status: Impaired/Different from baseline Area of Impairment: Attention;Safety/judgement;Awareness;Problem solving;Following commands                   Current Attention Level: Sustained   Following Commands: Follows one step commands with increased time Safety/Judgement: Decreased awareness of safety Awareness: Emergent Problem Solving: Slow processing;Requires verbal cues;Requires tactile cues        Exercises      General Comments General comments (skin integrity, edema, etc.): extremely red and flush in face and neck (daughter reports this is normal) patient also very flat affect and minimally verbal;      Pertinent Vitals/Pain Pain Assessment: Faces Faces Pain Scale: Hurts even more Pain Location: back and abdomen Pain Descriptors / Indicators: Sore Pain Intervention(s): Monitored during session    Home Living  Prior Function            PT Goals (current goals can now be found in the care plan section) Acute Rehab PT Goals Patient Stated Goal: To get stronger PT Goal Formulation: With patient/family Time For Goal Achievement:  07/10/16 Potential to Achieve Goals: Good Progress towards PT goals: Not progressing toward goals - comment (modest decline from previous session)    Frequency    Min 3X/week      PT Plan Current plan remains appropriate    Co-evaluation              AM-PAC PT "6 Clicks" Daily Activity  Outcome Measure  Difficulty turning over in bed (including adjusting bedclothes, sheets and blankets)?: A Little Difficulty moving from lying on back to sitting on the side of the bed? : Total Difficulty sitting down on and standing up from a chair with arms (e.g., wheelchair, bedside commode, etc,.)?: Total Help needed moving to and from a bed to chair (including a wheelchair)?: A Lot Help needed walking in hospital room?: A Little Help needed climbing 3-5 steps with a railing? : A Lot 6 Click Score: 12    End of Session Equipment Utilized During Treatment: Gait belt;Oxygen Activity Tolerance: Patient limited by fatigue Patient left: with call bell/phone within reach;with family/visitor present;in chair   PT Visit Diagnosis: Muscle weakness (generalized) (M62.81);Other abnormalities of gait and mobility (R26.89)     Time: 1410-3013 PT Time Calculation (min) (ACUTE ONLY): 24 min  Charges:  $Therapeutic Activity: 23-37 mins                    G Codes:       Alben Deeds, PT DPT  [phone]    Duncan Dull 06/29/2016, 6:06 PM

## 2016-06-29 NOTE — Progress Notes (Signed)
  PHARMACY - ADULT TOTAL PARENTERAL NUTRITION CONSULT NOTE   Pharmacy Consult for TPN Indication: prolonged NPO status/high NG output   Patient Measurements: Height: '5\' 3"'$  (160 cm) Weight: 187 lb 6.3 oz (85 kg) IBW/kg (Calculated) : 52.4 TPN AdjBW (KG): 60.3 Body mass index is 33.19 kg/m. 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 Patient had a significant hx of N/V for 2-4 weeks prior to admission. Weight appears to have been maintained. Patient had a NG tube placed 5/20 which has has significant output with 1-1.5 L a day.   GI: NG tube in place for drainage (About 1 L out yesterday which is trending down) Lower abdominal pain improved. Prealbumin-14.8 and albumin down to 1.9. CVTS decreased TPN on 5/25 due to ARDs picture, seems to be better now. Last BM was 5/19. Possible ileus with no bowel movements or gas? Phenergan Q 6 prn. Bisacodyl every morning Endo: Hx of DM. No insulin at home. CBGs somewhat controlled (150-200s) on Lantus and SSI. No hypoglycemic events. Insulin requirements in the past 24 hours: 24 units of aspart; 60 units of Lantus  Lytes: Na-139, K down to 3.2 even with replacement yesterday (is getting IV lasix daily), CoCa-10.1, Mg and Phos wnl. Renal:AKI: Scr 1.3 (Baseline < 1). BUN up to 56. UOP good. I/Os are mostly neutral this admit. Pulm: 6 L on aerosol mask; Dulera Cards: Stanford type B aortic dissection; BP WNL-high, HR 70s in NSR. Esmolol gtt , lopressor increased to 7.5 mg Q 4 , clonidine 0.2 mg patch  Lasix added 5/24. CTA planned for today.  Hepatobil:AST/ALT trending down 52/73. Alk phos slightly elevated, Tbili wnl. Triglycerides back down to 167 Neuro:Citalopram; Hydromorphone prn, Fentanyl patch  FV:CBSWHQPRF on Meropenem for respiratory failure from PNA. Clindamycin and azithromycin stopped. WBC elevated but down to 22.9 (peaked at 36.7) Afebrile. No growth on  cultures  Best Practices:PPI TPN Access:PICC line placed 5/20 TPN start date:5/22  Nutritional Goals (per RD recommendation on 5/25): KCal: 2000-2200 kcal/day  Protein: 110-120 gm/day  Goal  TPN rate: Clinimix 5/15 E @ 83 ml/hr + lipid emulsion 20 ml/hr   Current Nutrition:  TPN  Plan:  Continue Clinimix E 5/15 at 83 ml/hr Continue to hold IV lipid emulsions. Will consider starting IV lipids this week This provides 100 g of protein and 1414 kCals per day meeting 91% of protein and 71% of kCal needs Continue MVI in TPN Continue TE in TPN every other day, next 5/28 Increase Lantus to 35 units BID Continue resistant SSI and adjust as needed Monitor Bmet tomorrow, TPN labs F/U ability to transition to TFs  KCl 57mq IV x 4 runs orderd per ICU protocol this am, will add another order for KCl 114m IV x 4 runs to give a total of 8 runs today since potassium remains low after active replacement  If bowel ischemia and ileus resolve, consider placing Cortrak tube and trialing TFs  NaElenor QuinonesPharmD, BCPS Clinical Pharmacist Pager [phone]/28/2018 7:11 AM

## 2016-06-29 NOTE — Progress Notes (Signed)
[facility] Progress Note    Subjective: Patient is a bit better, but still having some abdominal pain.  Objective: Vital signs in last 24 hours: Temp:  [96.5 F (35.8 C)-98.4 F (36.9 C)] 98.2 F (36.8 C) (05/28 0700) Pulse Rate:  [67-82] 71 (05/28 0700) Resp:  [14-27] 23 (05/28 0700) BP: (86-155)/(40-97) 129/56 (05/28 0700) SpO2:  [89 %-100 %] 99 % (05/28 0828) Weight:  [85 kg (187 lb 6.3 oz)] 85 kg (187 lb 6.3 oz) (05/28 0400) Last BM Date: 06/20/16  Intake/Output from previous day: 05/27 0701 - 05/28 0700 In: 3890.9 [I.V.:3240.9; NG/GT:100; IV Piggyback:550] Out: 1610 [Urine:3925; Emesis/NG output:950] Intake/Output this shift: Total I/O In: -  Out: 225 [Urine:225]  General: No acute distress at rest.  Lungs: clear  Abd: Grimaces with pressure to abdomen, but soft, no reboudn.  Hypoactive bowel sounds.  Only 950cc output in the last 24 h ours.  reglan discontinued.  Extremities: No changes  Neuro: Intact  Lab Results:  @LABLAST2 (wbc:2,hgb:2,hct:2,plt:2) BMET ) Recent Labs  06/28/16 0347 06/29/16 0336  NA 139 143  K 3.4* 3.2*  CL 109 112*  CO2 22 22  GLUCOSE 169* 150*  BUN 55* 56*  CREATININE 1.35* 1.30*  CALCIUM 8.6* 8.5*   PT/INR No results for input(s): LABPROT, INR in the last 72 hours. ABG  Recent Labs  06/26/16 1512 06/28/16 1615  PHART 7.429 7.430  HCO3 21.5 20.7    Studies/Results: Dg Chest Port 1 View  Result Date: 06/29/2016 CLINICAL DATA:  Shortness of Breath EXAM: PORTABLE CHEST 1 VIEW COMPARISON:  06/28/2016 FINDINGS: Nasogastric catheter is noted coiled within the stomach. Right-sided PICC line is seen in the distal superior vena cava. Cardiac shadow is mildly enlarged but stable. Bilateral vascular congestion and edema is seen stable from prior exam. Some atelectasis remains in the left retrocardiac region. IMPRESSION: No significant interval change from the prior exam. Electronically Signed   By: Inez Catalina M.D.   On: 06/29/2016  07:36   Dg Chest Port 1 View  Result Date: 06/28/2016 CLINICAL DATA:  Aortic dissection. EXAM: PORTABLE CHEST 1 VIEW COMPARISON:  06/27/2016. FINDINGS: The heart is enlarged. There is moderate vascular congestion. Prominence of the aortic contour reflecting the type B dissection. LEFT lower lobe atelectasis with effusion. IMPRESSION: Cardiomegaly. Aortic dissection. Vascular congestion. LEFT lower lobe atelectasis with effusion. Electronically Signed   By: Staci Righter M.D.   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 Multiplanar reconstructed images and MIPs were obtained and reviewed to evaluate the vascular anatomy. CONTRAST:  100 cc Isovue 370 COMPARISON:  CT 06/20/2016 FINDINGS: CTA CHEST FINDINGS Cardiovascular: Heart: Heart size unchanged. No pericardial fluid/ thickening. No significant coronary calcifications. Aorta: Re- demonstration of type B dissection with the entry tear appearing to originate just beyond the origin of the left subclavian artery. Branch vessels remain patent without extension of the dissection flap into the branch vessels. Caliber and contour of the ascending aorta unremarkable without evidence of retrograde extension. Greatest diameter of the ascending aorta 2.9 cm. Inflammatory changes surrounding the distal aortic arch in the proximal descending aorta. Greatest diameter of the distal aortic arch measures approximately 3.4 cm. Greatest diameter on the comparison CT approximately 3.0  cm. No aneurysm of the descending thoracic aorta with the greatest diameter of the false lumen measuring 2.7 cm. True lumen is compressed. There is a  fenestration in the distal true lumen at the level of the diaphragm just above the aortic hiatus. Pulmonary arteries: No lobar, segmental, or proximal subsegmental filling defects. Mediastinum/Nodes: No mediastinal hemorrhage. Mediastinal lymph nodes are present. Gastric tube within the esophagus. Lungs/Pleura: Ground-glass opacities developing through the the bilateral lungs. Developing interlobular septal thickening. Atelectasis of the medial segment left lower lobe. Small low-density left pleural effusion trace right-sided pleural effusion and associated atelectasis. No pneumothorax. Right upper extremity PICC appears to terminate superior vena cava. Review of the MIP images confirms the above findings. CTA ABDOMEN AND PELVIS FINDINGS VASCULAR Aorta: Re- demonstration of dissection flap of the abdominal aorta. No aneurysm.  No periaortic fluid of the abdomen. The true lumen is compressed throughout the abdomen. True lumen contributes to celiac artery origin, superior mesenteric artery origin, and also continues across the origin of the inferior mesenteric artery. The false lumen contributes to perfusion of the right renal artery. The lateral margin of the dissection flap involves the origin of 2 left renal arteries both superior and inferior. True lumen is decompressed in the inferior aorta extending into the bilateral iliac arteries. The bilateral common iliac arteries appear perfused from the false lumen bilaterally. False lumen extends in the bilateral external iliac artery. Likely re- entry tear of distal external iliac arteries bilaterally, on the right image 173, on the left image 171. Dissection flap extends into the bilateral hypogastric arteries which are partially patent with decreased flow. Celiac: Origin of the celiac artery originates from the true lumen which is decompressed. Celiac artery remains patent at this time including the branch vessels. Typical branch pattern of splenic artery, left  gastric artery, common hepatic artery. SMA: Superior mesenteric artery origin originates from the true lumen which is compressed. SMA remains patent at this time. Renals: Right renal artery originates from the false lumen. Right renal artery is perfusing at this time with uniform perfusion of the right kidney. There are 2 left renal arteries, superior and inferior. Both arteries originate near the margin of the dissection flap. The superior left renal artery is partially filling, at the in flexion point of the dissection flap, perfusing superior kidney. The inferior left renal artery appears thrombosed, new from the comparison with enlarging left renal infarction, predominantly of the lower pole. IMA: Origin of the inferior mesenteric artery is thrombosed given the collapsed true lumen at this level. There is re- constitution of the distal inferior mesenteric artery via collateral flow. Right lower extremity: False lumen perfuses the right common iliac artery with collapse of the true lumen. Dissection flap extends into the hypogastric artery, with the distal pelvic branch is partially opacifying. External iliac artery is perfusing from the false lumen, with unremarkable appearance of the distal external iliac artery, common femoral artery, profunda femoris, SFA. There is the appearance of a re- entry tear in the mid right external iliac artery. Left lower extremity: False lumen perfuses the left common iliac artery with collapse of the true lumen. Dissection extends into the hypogastric artery which is partially perfused with opacification of pelvic vessels. Distal external iliac artery an the common femoral artery unremarkable, with patent proximal femoral vessels. There is the appearance of a re- entry tear in the mid left external iliac artery. Veins: Unremarkable appearance of the venous system. Review of the MIP images confirms the above findings. NON-VASCULAR  Hepatobiliary: Unremarkable appearance of the  liver. Cholecystectomy Pancreas: Unremarkable appearance of the pancreas. No pericholecystic fluid or inflammatory changes. Unremarkable ductal system. Spleen: Unremarkable. Adrenals/Urinary Tract: Right adrenal gland unremarkable. Nodule of the left adrenal gland which measures 2.7 cm. Right: Right-sided kidney perfuses uniformly with no hydronephrosis. Left: Progressive left renal infarct with small portion of the superior kidney perfusing. The remainder of the left kidney is hypoperfused, progressed from the comparison. No hydronephrosis. Urinary catheter within the urinary bladder. Stomach/Bowel: Unremarkable appearance of stomach with gastric tube in place. Small bowel borderline dilated and fluid-filled. No transition point. The wall of the small bowel relatively uniformly thin and enhancing, although the study is timed for the arterial phase. No focal wall thickening. No abnormally distended colon. No transition point. Fluid filled colon. No focal wall thickening or pericolonic inflammatory changes. Lymphatic: Multiple lymph nodes in the para-aortic nodal station, none of which are enlarged. Mesenteric: No free fluid or air. No adenopathy. Reproductive: Hysterectomy Other: No hernia. Musculoskeletal: No displaced fracture. Degenerative changes of the spine. 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 Only 1 possible fenestration is identified, in the distal thoracic aorta above the aortic hiatus. There is enlarging diameter of the distal aortic arch with associated inflammatory changes, measuring approximately 3.5 on today's study compared to approximately 3.1 cm previously. The appearance is concerning for progression/expansion of intramural hematoma. Progressing left renal infarction, with near complete thrombosis of superior and inferior renal arteries, both of which originate at the margin of the dissection flap. Bilateral  common iliac arteries appear to be perfused from the false lumen, with complete collapse of true lumen proximally. There does appear to be re- entry to the true lumen in the mid external iliac artery bilaterally, as there is unremarkable appearance of the proximal femoral vasculature including bilateral common iliac arteries. The 3 mesenteric vessels originate from the small true lumen, which is progressively compressed. Celiac artery and superior mesenteric artery remain patent, with occlusion at the origin of the inferior mesenteric artery. Three vessel arch, with all 3 branches remain patent. No evidence of extension of the dissection flap into the branch vessels. These preliminary results were discussed by telephone at the time of interpretation on 06/27/2016 at 12:41 pm with Dr. Curt Jews. Borderline dilated small bowel without transition point or obstruction. Findings may represent ileus and/or nonspecific enteritis. No focal wall thickening to suggest early ischemia, although the timing of this CTA is specific for arterial evaluation and not the bowel tissues. If ongoing concern for bowel ischemia, would repeat abd/pelvis contrast enhanced-CT portal venous phase. Similar appearance of mixed geographic ground-glass opacities of the bilateral lungs with mild interlobular septal thickening. Again, differential diagnosis includes pulmonary edema, ARDS, atypical infection. Signed, Dulcy Fanny. Earleen Newport, DO [facility] Radiology Electronically Signed   By: Corrie Mckusick D.O.   On: 06/27/2016 13:18    Anti-infectives: Anti-infectives    Start     Dose/Rate Route Frequency Ordered Stop   06/26/16 0830  meropenem (MERREM) 1 g in sodium chloride 0.9 % 100 mL IVPB     1 g 200 mL/hr over 30 Minutes Intravenous Every 8 hours 06/26/16 0810     06/22/16 1800  clindamycin (CLEOCIN) IVPB 600 mg  Status:  Discontinued     600 mg 100 mL/hr over 30 Minutes Intravenous Every 8  hours 06/22/16 1723 06/26/16 0810   06/22/16 1000  azithromycin (ZITHROMAX) 250 mg in  dextrose 5 % 125 mL IVPB  Status:  Discontinued     250 mg 125 mL/hr over 60 Minutes Intravenous Every 24 hours 06/22/16 0826 06/27/16 1019   06/22/16 0900  levofloxacin (LEVAQUIN) IVPB 500 mg  Status:  Discontinued     500 mg 100 mL/hr over 60 Minutes Intravenous Every 24 hours 06/22/16 0759 06/22/16 0824      Assessment/Plan: s/p  Clamping trial with NGT.  LOS: 9 days   Kathryne Eriksson. Dahlia Bailiff, MD, FACS [phone] [facility] 06/29/2016

## 2016-06-30 ENCOUNTER — Inpatient Hospital Stay (HOSPITAL_COMMUNITY): Payer: BLUE CROSS/BLUE SHIELD

## 2016-06-30 DIAGNOSIS — I7102 Dissection of abdominal aorta: Secondary | ICD-10-CM

## 2016-06-30 DIAGNOSIS — R0600 Dyspnea, unspecified: Secondary | ICD-10-CM

## 2016-06-30 DIAGNOSIS — R0602 Shortness of breath: Secondary | ICD-10-CM

## 2016-06-30 DIAGNOSIS — I7101 Dissection of thoracic aorta: Secondary | ICD-10-CM

## 2016-06-30 DIAGNOSIS — I200 Unstable angina: Secondary | ICD-10-CM

## 2016-06-30 DIAGNOSIS — I7103 Dissection of thoracoabdominal aorta: Secondary | ICD-10-CM

## 2016-06-30 LAB — BASIC METABOLIC PANEL
ANION GAP: 6 (ref 5–15)
BUN: 55 mg/dL — ABNORMAL HIGH (ref 6–20)
CALCIUM: 8.5 mg/dL — AB (ref 8.9–10.3)
CO2: 22 mmol/L (ref 22–32)
Chloride: 114 mmol/L — ABNORMAL HIGH (ref 101–111)
Creatinine, Ser: 1.17 mg/dL — ABNORMAL HIGH (ref 0.44–1.00)
GFR, EST AFRICAN AMERICAN: 58 mL/min — AB (ref >60)
GFR, EST NON AFRICAN AMERICAN: 50 mL/min — AB (ref >60)
GLUCOSE: 154 mg/dL — AB (ref 65–99)
POTASSIUM: 4 mmol/L (ref 3.5–5.1)
Sodium: 142 mmol/L (ref 135–145)

## 2016-06-30 LAB — GLUCOSE, CAPILLARY
Glucose-Capillary: 135 mg/dL — ABNORMAL HIGH (ref 65–99)
Glucose-Capillary: 160 mg/dL — ABNORMAL HIGH (ref 65–99)
Glucose-Capillary: 171 mg/dL — ABNORMAL HIGH (ref 65–99)
Glucose-Capillary: 173 mg/dL — ABNORMAL HIGH (ref 65–99)
Glucose-Capillary: 179 mg/dL — ABNORMAL HIGH (ref 65–99)
Glucose-Capillary: 182 mg/dL — ABNORMAL HIGH (ref 65–99)
Glucose-Capillary: 193 mg/dL — ABNORMAL HIGH (ref 65–99)

## 2016-06-30 LAB — CBC
HCT: 33.7 % — ABNORMAL LOW (ref 36.0–46.0)
Hemoglobin: 10.7 g/dL — ABNORMAL LOW (ref 12.0–15.0)
MCH: 33 pg (ref 26.0–34.0)
MCHC: 31.8 g/dL (ref 30.0–36.0)
MCV: 104 fL — ABNORMAL HIGH (ref 78.0–100.0)
Platelets: 164 10*3/uL (ref 150–400)
RBC: 3.24 MIL/uL — ABNORMAL LOW (ref 3.87–5.11)
RDW: 14.6 % (ref 11.5–15.5)
WBC: 18.4 10*3/uL — ABNORMAL HIGH (ref 4.0–10.5)

## 2016-06-30 MED ORDER — HYDRALAZINE HCL 20 MG/ML IJ SOLN
10.0000 mg | INTRAMUSCULAR | Status: DC
  Administered 2016-06-30 – 2016-07-14 (×68): 10 mg via INTRAVENOUS
  Filled 2016-06-30 (×68): qty 1

## 2016-06-30 MED ORDER — FAT EMULSION 20 % IV EMUL
240.0000 mL | INTRAVENOUS | Status: AC
  Administered 2016-06-30: 240 mL via INTRAVENOUS
  Filled 2016-06-30: qty 250

## 2016-06-30 MED ORDER — FUROSEMIDE 10 MG/ML IJ SOLN
40.0000 mg | Freq: Two times a day (BID) | INTRAMUSCULAR | Status: DC
  Administered 2016-06-30 – 2016-07-05 (×10): 40 mg via INTRAVENOUS
  Filled 2016-06-30 (×11): qty 4

## 2016-06-30 MED ORDER — POTASSIUM CHLORIDE 10 MEQ/50ML IV SOLN
10.0000 meq | INTRAVENOUS | Status: AC
  Administered 2016-06-30 (×3): 10 meq via INTRAVENOUS
  Filled 2016-06-30 (×3): qty 50

## 2016-06-30 MED ORDER — LABETALOL HCL 5 MG/ML IV SOLN
0.5000 mg/min | INTRAVENOUS | Status: DC
  Administered 2016-06-30: 0.5 mg/min via INTRAVENOUS
  Filled 2016-06-30: qty 100

## 2016-06-30 MED ORDER — M.V.I. ADULT IV INJ
INJECTION | INTRAVENOUS | Status: AC
Start: 2016-06-30 — End: 2016-07-01
  Administered 2016-06-30: 18:00:00 via INTRAVENOUS
  Filled 2016-06-30: qty 1992

## 2016-06-30 MED ORDER — ESMOLOL HCL-SODIUM CHLORIDE 2000 MG/100ML IV SOLN
50.0000 ug/kg/min | INTRAVENOUS | Status: AC
  Filled 2016-06-30: qty 100

## 2016-06-30 NOTE — Progress Notes (Signed)
Physical Therapy Treatment Patient Details Name: Haley Graham MRN: [id number] DOB: [birth year] Today's Date: 06/30/2016    History of Present Illness Pt adm with type B aortic dissection. Treated non surgically with BP control and pain control. Pt developed pancreatitis and ileus with NG tube placed. PMH - anxiety, arthritis    PT Comments    Patient able to make some progress towards mobility goals today. Tolerated 2 bouts of ambulation short distances (50' total). Continues to demonstrate some cognitive deficits during session. VSS on 4 liters except BP elevated post activty (systolic 778 prior to activity, 140s post activity)  Follow Up Recommendations  Home health PT;Supervision/Assistance - 24 hour ([place] aide)     Equipment Recommendations  3in1 (PT);Rolling walker with 5" wheels    Recommendations for Other Services       Precautions / Restrictions Precautions Precautions: Fall Restrictions Weight Bearing Restrictions: No    Mobility  Bed Mobility               General bed mobility comments: received in chair  Transfers Overall transfer level: Needs assistance Equipment used: Rolling walker (2 wheeled);1 person hand held assist Transfers: Sit to/from Stand Sit to Stand: Min assist         General transfer comment: VCs for hand placement, min assist for stability upon power up to standing. Performed x3 during session  Ambulation/Gait Ambulation/Gait assistance: Min assist;+2 safety/equipment Ambulation Distance (Feet): 50 Feet Assistive device: Rolling walker (2 wheeled);1 person hand held assist Gait Pattern/deviations: Step-to pattern;Decreased stride length;Shuffle;Trunk flexed Gait velocity: decreased Gait velocity interpretation: <1.8 ft/sec, indicative of risk for recurrent falls General Gait Details: patient very limited with use of RW, max multi modal cues for performance of ambulation, appeared confused with device despite previous use. Reverted  to HHA and patient did much better with movement and ability to take strides. one seated rest break required today   Stairs            Wheelchair Mobility    Modified Rankin (Stroke Patients Only)       Balance Overall balance assessment: Needs assistance   Sitting balance-Leahy Scale: Good     Standing balance support: Single extremity supported Standing balance-Leahy Scale: Poor Standing balance comment: due to weakness and instability                            Cognition Arousal/Alertness: Awake/alert Behavior During Therapy: Flat affect Overall Cognitive Status: Impaired/Different from baseline Area of Impairment: Attention;Safety/judgement;Awareness;Problem solving;Following commands                   Current Attention Level: Sustained   Following Commands: Follows one step commands with increased time Safety/Judgement: Decreased awareness of safety Awareness: Emergent Problem Solving: Slow processing;Requires verbal cues;Requires tactile cues General Comments: more communicative when sitting, but remains limited in communication when active. Patient with appearance of "spacing out": requring max cues to direct attention      Exercises      General Comments General comments (skin integrity, edema, etc.): ambulated on 4 liters O2, stable saturations BP 242P systolic before ambulation and 140s post activity      Pertinent Vitals/Pain Pain Assessment: Faces Faces Pain Scale: Hurts little more Pain Location: back Pain Descriptors / Indicators: Sore Pain Intervention(s): Monitored during session    Home Living  Prior Function            PT Goals (current goals can now be found in the care plan section) Acute Rehab PT Goals Patient Stated Goal: To get stronger PT Goal Formulation: With patient/family Time For Goal Achievement: 07/10/16 Potential to Achieve Goals: Good Progress towards PT goals:  Progressing toward goals (modest progression)    Frequency    Min 3X/week      PT Plan Current plan remains appropriate    Co-evaluation              AM-PAC PT "6 Clicks" Daily Activity  Outcome Measure  Difficulty turning over in bed (including adjusting bedclothes, sheets and blankets)?: A Little Difficulty moving from lying on back to sitting on the side of the bed? : Total Difficulty sitting down on and standing up from a chair with arms (e.g., wheelchair, bedside commode, etc,.)?: Total Help needed moving to and from a bed to chair (including a wheelchair)?: A Lot Help needed walking in hospital room?: A Little Help needed climbing 3-5 steps with a railing? : A Lot 6 Click Score: 12    End of Session Equipment Utilized During Treatment: Gait belt;Oxygen Activity Tolerance: Patient limited by fatigue Patient left: with call bell/phone within reach;with family/visitor present;in chair   PT Visit Diagnosis: Muscle weakness (generalized) (M62.81);Other abnormalities of gait and mobility (R26.89)     Time: 6270-3500 PT Time Calculation (min) (ACUTE ONLY): 25 min  Charges:  $Gait Training: 8-22 mins $Therapeutic Activity: 8-22 mins                    G Codes:       Alben Deeds, PT DPT  [place] 06/30/2016, 10:21 AM

## 2016-06-30 NOTE — Progress Notes (Signed)
  Subjective: General improved-had liquid bowel movements a day and was ambulated in the hallway Blood pressure control change from  Esmolol to IV labetalol with better results White count continues to improve Abdomen with minimal tenderness Patient appears somewhat narcotized and will stop fentanyl patch Objective: Vital signs in last 24 hours: Temp:  [97.7 F (36.5 C)-98.1 F (36.7 C)] 98.1 F (36.7 C) (05/29 1130) Pulse Rate:  [62-81] 79 (05/29 1610) Cardiac Rhythm: Normal sinus rhythm (05/29 1200) Resp:  [11-29] 24 (05/29 1610) BP: (70-165)/(41-121) 113/47 (05/29 1610) SpO2:  [91 %-100 %] 94 % (05/29 1610) Weight:  [186 lb 11.7 oz (84.7 kg)] 186 lb 11.7 oz (84.7 kg) (05/29 0415)  Hemodynamic parameters for last 24 hours:  sinus rhythm  Intake/Output from previous day: 05/28 0701 - 05/29 0700 In: 4199.6 [I.V.:3519.6; NG/GT:30; IV Piggyback:650] Out: 0045 [Urine:2795; Emesis/NG output:700] Intake/Output this shift: Total I/O In: 1479.4 [I.V.:1169.4; NG/GT:60; IV Piggyback:250] Out: 2300 [Urine:2200; Emesis/NG output:100]  Chronically ill middle-aged female Lungs with scattered rhonchi Abdomen mildly distended nontender Extremities warm with good pedal pulses  Lab Results:  Recent Labs  06/29/16 0336 06/30/16 0415  WBC 22.9* 18.4*  HGB 11.2* 10.7*  HCT 34.4* 33.7*  PLT 152 164   BMET:  Recent Labs  06/29/16 0336 06/30/16 0415  NA 143 142  K 3.2* 4.0  CL 112* 114*  CO2 22 22  GLUCOSE 150* 154*  BUN 56* 55*  CREATININE 1.30* 1.17*  CALCIUM 8.5* 8.5*    PT/INR: No results for input(s): LABPROT, INR in the last 72 hours. ABG    Component Value Date/Time   PHART 7.430 06/28/2016 1615   HCO3 20.7 06/28/2016 1615   TCO2 22 06/28/2016 1615   ACIDBASEDEF 3.0 (H) 06/28/2016 1615   O2SAT 93.0 06/28/2016 1615   CBG (last 3)   Recent Labs  06/30/16 0842 06/30/16 1227 06/30/16 1623  GLUCAP 171* 193* 182*    Assessment/Plan: S/P  Continue current  care NG tube clamping trial per general surgery-may be able to remove NG tube tomorrow as drainage appears to be decreasing Continue T NA Continue empiric antibiotics for probable bowel ischemia Continue PT ambulation Hold anticoagulation since patient had a significant Stanford type B  aortic dissection   LOS: 10 days    [name] 06/30/2016

## 2016-06-30 NOTE — Progress Notes (Signed)
  PHARMACY - ADULT TOTAL PARENTERAL NUTRITION CONSULT NOTE   Pharmacy Consult for TPN Indication: prolonged NPO status/high NG output   Patient Measurements: Height: _0  (160 cm) Weight: 186 lb 11.7 oz (84.7 kg) IBW/kg (Calculated) : 52.4 TPN AdjBW (KG): 60.3 Body mass index is 33.08 kg/m. 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 Patient had a significant hx of N/V for 2-4 weeks prior to admission. Weight appears to have been maintained. Patient had a NG tube placed 5/20 which has has significant output with 1-1.5 L a day.   GI: NG tube in place for drainage (About 700 mL out 5/28- trending down) Lower abdominal pain improved. Prealbumin down to 9.0 and albumin 1.9. Had a BM this AM- 5/29- Slow recovery of GI tract. Phenergan Q 6 prn. Bisacodyl every morning. NG clamped- Doing well with ice chips Endo: Hx of DM. No insulin at home. CBGs somewhat controlled (129-183) on Lantus and SSI. No hypoglycemic events. Insulin requirements in the past 24 hours: 20 units of aspart; 70 units of Lantus  Lytes: Na-142, K up to 4.0 (8 runs on 5/28), CoCa-10.1, Mg and Phos wnl. Renal:AKI: Scr improved to 1.17(Baseline < 1). BUN down to 55. UOP good. I/Os are mostly neutral this admit. Pulm: 5 L [place]; Dulera; Brovana added 5/26 Cards: Stanford type B aortic dissection; BP soft-WNL, HR 60s-70s in NSR. Esmolol gtt , lopressor increased to 10 mg Q 4 , clonidine 0.3 mg patch , nitroglycerin 0.64m patch, lasix 40 qd Hepatobil:AST/ALT trending down 52/73. Alk phos slightly elevated, Tbili wnl. Triglycerides back down to 167 Neuro:Citalopram; Hydromorphone prn, Fentanyl patch  ICM:[id number] Meropenem (D#5) for respiratory failure from PNA. Clindamycin and azithromycin stopped. WBC elevated but down to 18.4 (peaked at 36.7) Afebrile. No growth on cultures  Best Practices:PPI TPN Access:PICC line placed 5/20 TPN start  date:5/22  Nutritional Goals (per RD recommendation on 5/25): KCal: 2000-2200 kcal/day  Protein: 110-120 gm/day   Goal  TPN rate: Clinimix 5/15 E @ 83 ml/hr + lipid emulsion 20 ml/hr   Current Nutrition:  TPN  Plan:  Continue Clinimix E 5/15 at 83 ml/hr Start lipid emulsion 20% at 20 ml/hr  This provides 100 g of protein and 1894 kCals per day meeting 91% of protein and 95% of kCal needs Continue MVI in TPN Continue TE in TPN every other day, next 5/30 Continue Lantus at 35 units BID Continue resistant SSI and adjust as needed F/U ability to transition to TFs  Give 30 meQ of potassium chloride due to electrolyte pattern (Patient has required at least 30-40 meq/day to maintain appropriate potassium level)   Bowel movement today 5/29. Consider placing Cortrak tube and trialing TFs  EUvaldo Bristle PharmD PGY1 Pharmacy Resident  Pager: [phone]/29/2018 8:06 AM

## 2016-06-30 NOTE — Progress Notes (Signed)
Subjective: Interval History: none.. Had a bowel movement this morning. Sitting up in chair. Mild confusion last night.  Objective: Vital signs in last 24 hours: Temp:  [97.6 F (36.4 C)-98.1 F (36.7 C)] 98 F (36.7 C) (05/29 0400) Pulse Rate:  [62-75] 73 (05/29 0700) Resp:  [11-25] 20 (05/29 0700) BP: (99-152)/(47-68) 135/68 (05/29 0700) SpO2:  [91 %-99 %] 97 % (05/29 0700) Weight:  [186 lb 11.7 oz (84.7 kg)] 186 lb 11.7 oz (84.7 kg) (05/29 0415)  Intake/Output from previous day: 05/28 0701 - 05/29 0700 In: 4199.6 [I.V.:3519.6; NG/GT:30; IV Piggyback:650] Out: 3495 [Urine:2795; Emesis/NG output:700] Intake/Output this shift: No intake/output data recorded.  No change in abdominal exam with mild diffuse tenderness  Lab Results:  Recent Labs  06/29/16 0336 06/30/16 0415  WBC 22.9* 18.4*  HGB 11.2* 10.7*  HCT 34.4* 33.7*  PLT 152 164   BMET  Recent Labs  06/29/16 0336 06/30/16 0415  NA 143 142  K 3.2* 4.0  CL 112* 114*  CO2 22 22  GLUCOSE 150* 154*  BUN 56* 55*  CREATININE 1.30* 1.17*  CALCIUM 8.5* 8.5*    Studies/Results: Ct Abdomen Pelvis Wo Contrast  Result Date: 06/26/2016 CLINICAL DATA:  Shortness of breath EXAM: CT CHEST, ABDOMEN AND PELVIS WITHOUT CONTRAST TECHNIQUE: Multidetector CT imaging of the chest, abdomen and pelvis was performed following the standard protocol without IV contrast. COMPARISON:  06/20/2016 chest CT FINDINGS: CT CHEST FINDINGS Cardiovascular: Normal heart size. No pericardial effusion. 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 Mild haziness around the upper descending segment is likely stable. Stable maximal diameter of 33 mm at this level. No intramural hematoma. Intermittently seen displaced intimal calcification. Mediastinum/Nodes: Negative for adenopathy. Right upper extremity PICC with tip at the SVC level. Lungs/Pleura: Multi segment atelectasis, worse on the left. Small pleural effusions. There is patchy  ground-glass airspace density in the bilateral lungs without gradient. Airways are clear and there is no septal thickening. Musculoskeletal: No acute or aggressive finding. CT ABDOMEN PELVIS FINDINGS Hepatobiliary: Hepatic steatosis.Cholecystectomy with normal common bile duct diameter. Pancreas: Unremarkable. Spleen: Unremarkable. 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 The larger measures up to 27 mm and is indeterminate by densitometry, but left adrenal mass has been noted since at least 2006 abdominal CT report, images not available. Left renal cyst. No hydronephrosis or urolithiasis. 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 Negative decompressed urinary bladder. Stomach/Bowel: There is diffuse small bowel distention and fluid filling with mildly hazy mesenteries. Similar features in the colon proximally and at the transverse segment. No pneumatosis or perforation is noted. Nasogastric tube tip is at the pylorus. Vascular/Lymphatic: There is intimal flap displacement from known dissection, morphology appearing similar to prior. No mass or adenopathy. Reproductive:Hysterectomy.  Unremarkable ovaries. Other: No ascites or pneumoperitoneum.  Anasarca. Musculoskeletal: No acute abnormalities. These results were called by telephone at the time of interpretation on 06/26/2016 at 2:05 pm to Dr. Ivin Poot , who verbally acknowledged these results. IMPRESSION: 1. Diffuse airspace disease. Pattern can be seen with noncardiogenic edema (ARDS), inflammatory pneumonitis, or atypical infection. Multi segment atelectasis at the bases and small pleural effusions. 2. Diffuse small bowel and colonic distention with fluid levels as seen with ileus. Question underlying bowel ischemia given the patient's known aortic dissection with proximal IMA occlusion and SMA flow via the narrow true lumen. 3. Incidental findings noted  above. Electronically Signed  By: Monte Fantasia M.D.   On: 06/26/2016 14:05   Ct Chest Wo Contrast  Result Date: 06/26/2016 CLINICAL DATA:  Shortness of breath EXAM: CT CHEST, ABDOMEN AND PELVIS WITHOUT CONTRAST TECHNIQUE: Multidetector CT imaging of the chest, abdomen and pelvis was performed following the standard protocol without IV contrast. COMPARISON:  06/20/2016 chest CT FINDINGS: CT CHEST FINDINGS Cardiovascular: Normal heart size. No pericardial effusion. 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 Mild haziness around the upper descending segment is likely stable. Stable maximal diameter of 33 mm at this level. No intramural hematoma. Intermittently seen displaced intimal calcification. Mediastinum/Nodes: Negative for adenopathy. Right upper extremity PICC with tip at the SVC level. Lungs/Pleura: Multi segment atelectasis, worse on the left. Small pleural effusions. There is patchy ground-glass airspace density in the bilateral lungs without gradient. Airways are clear and there is no septal thickening. Musculoskeletal: No acute or aggressive finding. CT ABDOMEN PELVIS FINDINGS Hepatobiliary: Hepatic steatosis.Cholecystectomy with normal common bile duct diameter. Pancreas: Unremarkable. Spleen: Unremarkable. 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 The larger measures up to 27 mm and is indeterminate by densitometry, but left adrenal mass has been noted since at least 2006 abdominal CT report, images not available. Left renal cyst. No hydronephrosis or urolithiasis. 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 Negative decompressed urinary bladder. Stomach/Bowel: There is diffuse small bowel distention and fluid filling with mildly hazy mesenteries. Similar features in the colon proximally and at the transverse segment. No pneumatosis or perforation is noted. Nasogastric tube tip is  at the pylorus. Vascular/Lymphatic: There is intimal flap displacement from known dissection, morphology appearing similar to prior. No mass or adenopathy. Reproductive:Hysterectomy.  Unremarkable ovaries. Other: No ascites or pneumoperitoneum.  Anasarca. Musculoskeletal: No acute abnormalities. These results were called by telephone at the time of interpretation on 06/26/2016 at 2:05 pm to Dr. Ivin Poot , who verbally acknowledged these results. IMPRESSION: 1. Diffuse airspace disease. Pattern can be seen with noncardiogenic edema (ARDS), inflammatory pneumonitis, or atypical infection. Multi segment atelectasis at the bases and small pleural effusions. 2. Diffuse small bowel and colonic distention with fluid levels as seen with ileus. Question underlying bowel ischemia given the patient's known aortic dissection with proximal IMA occlusion and SMA flow via the narrow true lumen. 3. Incidental findings noted above. Electronically Signed   By: Monte Fantasia M.D.   On: 06/26/2016 14:05   US Abdomen Complete  Result Date: 06/02/2016 CLINICAL DATA:  Nausea and abdominal distention EXAM: ABDOMEN ULTRASOUND COMPLETE COMPARISON:  None. FINDINGS: Gallbladder: Surgically removed Common bile duct: Diameter: 6 mm Liver: Increased in echogenicity consistent with fatty infiltration. No significant nodularity is noted. IVC: No abnormality visualized. Pancreas: Visualized portion unremarkable. Spleen: Size and appearance within normal limits. Right Kidney: Length: 12.5 cm. Echogenicity within normal limits. No mass or hydronephrosis visualized. Left Kidney: Length: 13.6 cm. Echogenicity within normal limits. No mass or hydronephrosis visualized. 2.2 cm cyst is noted in the midportion of the kidney anteriorly. This has increased in size from prior exam at which time it measured approximately 17 mm. Abdominal aorta: No aneurysm visualized. Other findings: None. IMPRESSION: Simple Left renal cyst increased in size by 5 mm.  Status post cholecystectomy. Fatty infiltration of the liver without focal mass. Electronically Signed   By: Inez Catalina M.D.   On: 06/02/2016 08:55   Dg Chest Port 1 View  Result Date: 06/30/2016 CLINICAL  DATA:  Breath, pneumonia, aortic aneurysm, GERD, hypertension, diabetes mellitus EXAM: PORTABLE CHEST 1 VIEW COMPARISON:  Portable exam 0633 hours compared to 06/29/2016 FINDINGS: Nasogastric tube coiled in proximal stomach. RIGHT arm PICC line tip projects over SVC above cavoatrial junction. Upper normal heart size. Mediastinal contours normal. Mild pulmonary vascular congestion. Persistent diffuse BILATERAL infiltrates. Atelectasis versus consolidation LEFT lower lobe. No pneumothorax. IMPRESSION: Persistent mild diffuse interstitial infiltrates with atelectasis versus consolidation in LEFT lower lobe. Electronically Signed   By: Lavonia Dana M.D.   On: 06/30/2016 06:58   Dg Chest Port 1 View  Result Date: 06/29/2016 CLINICAL DATA:  Shortness of Breath EXAM: PORTABLE CHEST 1 VIEW COMPARISON:  06/28/2016 FINDINGS: Nasogastric catheter is noted coiled within the stomach. Right-sided PICC line is seen in the distal superior vena cava. Cardiac shadow is mildly enlarged but stable. Bilateral vascular congestion and edema is seen stable from prior exam. Some atelectasis remains in the left retrocardiac region. IMPRESSION: No significant interval change from the prior exam. Electronically Signed   By: Inez Catalina M.D.   On: 06/29/2016 07:36   Dg Chest Port 1 View  Result Date: 06/28/2016 CLINICAL DATA:  Aortic dissection. EXAM: PORTABLE CHEST 1 VIEW COMPARISON:  06/27/2016. FINDINGS: The heart is enlarged. There is moderate vascular congestion. Prominence of the aortic contour reflecting the type B dissection. LEFT lower lobe atelectasis with effusion. IMPRESSION: Cardiomegaly. Aortic dissection. Vascular congestion. LEFT lower lobe atelectasis with effusion. Electronically Signed   By: Staci Righter M.D.    On: 06/28/2016 07:13   Dg Chest Port 1 View  Result Date: 06/27/2016 CLINICAL DATA:  ARDS. EXAM: PORTABLE CHEST 1 VIEW COMPARISON:  Jun 26, 2016 FINDINGS: The right PICC line is stable. The NG tube terminates below today's study. Diffuse bilateral primarily interstitial opacities are mildly more homogeneous in less patchy in appearance. More focal opacity in the left base is stable. No other changes. IMPRESSION: 1. Diffuse bilateral primarily interstitial opacities suggesting edema remain. More focal opacity in the left base is stable to mildly improved. Electronically Signed   By: Dorise Bullion III M.D   On: 06/27/2016 07:21   Dg Chest Port 1 View  Result Date: 06/26/2016 CLINICAL DATA:  Shortness of breath. EXAM: PORTABLE CHEST 1 VIEW COMPARISON:  Radiograph of Jun 25, 2016. FINDINGS: Stable cardiomegaly. Increased bilateral diffuse interstitial and basilar opacities are noted concerning for worsening edema. No pneumothorax is noted. Mild left pleural effusion is noted with associated atelectasis or infiltrate. Right-sided PICC line is unchanged in position. Nasogastric tube is unchanged in position. Bony thorax is unremarkable. IMPRESSION: Increased bilateral diffuse interstitial densities consistent with edema. Mild left pleural effusion is noted with associated atelectasis or infiltrate. Electronically Signed   By: Marijo Conception, M.D.   On: 06/26/2016 07:40   Dg Chest Port 1 View  Result Date: 06/25/2016 CLINICAL DATA:  Shortness of Breath EXAM: PORTABLE CHEST 1 VIEW COMPARISON:  06/24/2016 FINDINGS: Cardiac shadow is prominent but stable. Right-sided PICC line and nasogastric catheter are again seen and stable. Bibasilar atelectatic changes are noted worse on the left than the right and increased from the prior exam. Small left pleural effusion is noted as well. Mild vascular congestion is seen. IMPRESSION: Increasing bibasilar infiltrates left greater than right. Mild vascular congestion is  noted. The Electronically Signed   By: Inez Catalina M.D.   On: 06/25/2016 07:54   Dg Chest Port 1 View  Result Date: 06/24/2016 CLINICAL DATA:  Shortness of Breath EXAM: PORTABLE  CHEST 1 VIEW COMPARISON:  06/23/2016 FINDINGS: Right-sided PICC line is again noted at the cavoatrial junction. Nasogastric catheter is noted in satisfactory position through the overall inspiratory effort is decreased from the prior exam. Mild left basilar atelectasis remains. No other focal abnormality is noted. IMPRESSION: Stable left basilar atelectasis. Electronically Signed   By: Inez Catalina M.D.   On: 06/24/2016 08:07   Dg Chest Port 1 View  Result Date: 06/23/2016 CLINICAL DATA:  61 year old female with shortness of breath and abdominal pain. 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 Being treated conservatively at this time, including with bowel rest. EXAM: PORTABLE CHEST 1 VIEW COMPARISON:  Chest radiographs 06/22/2016 and earlier. CTA 06/20/2016 FINDINGS: Portable AP semi upright view at 0754 hours. Stable enteric tube which courses to the abdomen. Stable right PICC line. Mildly larger lung volumes and decreased streaky bibasilar opacity. Mild residual. No pneumothorax or pulmonary edema. No pleural effusion or consolidation. Stable cardiac size and mediastinal contours. IMPRESSION: 1.  Stable lines and tubes. 2. Mildly improved lung volumes and regressed bibasilar opacity favored to be atelectasis. Electronically Signed   By: Genevie Ann M.D.   On: 06/23/2016 08:19   Dg Chest Port 1 View  Result Date: 06/22/2016 CLINICAL DATA:  61 year old female with history of abdominal aortic aneurysm. Shortness of breath and vomiting. EXAM: PORTABLE CHEST 1 VIEW COMPARISON:  Chest x-ray 06/21/2016. FINDINGS: There is a right upper extremity PICC with tip terminating in the superior cavoatrial junction. A nasogastric tube  is seen extending into the stomach, however, the tip of the nasogastric tube extends below the lower margin of the image. Lung volumes are low. Linear bibasilar opacities favored to reflect areas of subsegmental atelectasis, however, underlying airspace consolidation from infection or aspiration is not excluded. No pleural effusions. No evidence of pulmonary edema. Heart size is normal. Upper mediastinal contours are within normal limits. IMPRESSION: 1. Support apparatus, as above. 2. Low lung volumes with bibasilar areas of atelectasis and/or airspace consolidation. Electronically Signed   By: Vinnie Langton M.D.   On: 06/22/2016 07:30   Dg Chest Port 1 View  Result Date: 06/21/2016 CLINICAL DATA:  Aortic dissection EXAM: PORTABLE CHEST 1 VIEW COMPARISON:  06/20/2016 FINDINGS: Bibasilar atelectasis. Heart is normal size. No effusions or acute bony abnormality. IMPRESSION: Bibasilar atelectasis. Electronically Signed   By: Rolm Baptise M.D.   On: 06/21/2016 07:18   Dg Chest Port 1 View  Result Date: 06/20/2016 CLINICAL DATA:  Hypoxia, unresponsive EXAM: PORTABLE CHEST 1 VIEW COMPARISON:  CTA chest dated 06/20/2016 FINDINGS: Lungs are essentially clear. No focal consolidation. No pleural effusion or pneumothorax. The heart is top-normal in size. IMPRESSION: No evidence of acute cardiopulmonary disease. Electronically Signed   By: Julian Hy M.D.   On: 06/20/2016 17:01   Dg Chest Port 1 View  Result Date: 06/20/2016 CLINICAL DATA:  Sudden onset sharp substernal chest pain that radiates to the back. Nausea, dizziness and shortness of breath. EXAM: PORTABLE CHEST 1 VIEW COMPARISON:  None. FINDINGS: Trachea is midline. Heart size is accentuated by AP supine technique. Probable mild scarring in the right middle lobe and lingula. Lungs are otherwise clear. No pleural fluid. IMPRESSION: No acute findings. Electronically Signed   By: Lorin Picket M.D.   On: 06/20/2016 09:56   Dg Abd Portable  1v  Result Date: 06/27/2016 CLINICAL DATA:  Ileus. EXAM: PORTABLE ABDOMEN - 1 VIEW COMPARISON:  Jun 26, 2016 FINDINGS: The NG tube terminates within the stomach. The bowel gas pattern is unremarkable on today's study. No other acute abnormalities. IMPRESSION: The NG tube terminates within the stomach. The bowel gas pattern is unremarkable. Electronically Signed   By: Dorise Bullion III M.D   On: 06/27/2016 07:19   Dg Abd Portable 1v  Result Date: 06/26/2016 CLINICAL DATA:  Shortness of breath, abdominal pain EXAM: PORTABLE ABDOMEN - 1 VIEW COMPARISON:  06/25/2016 FINDINGS: NG tube tip is in the distal stomach. Prior cholecystectomy. Nonobstructive bowel gas pattern. No free air organomegaly. IMPRESSION: NG tube tip in the distal stomach.  No acute findings. Electronically Signed   By: Rolm Baptise M.D.   On: 06/26/2016 07:36   Dg Abd Portable 1v  Result Date: 06/25/2016 CLINICAL DATA:  Shortness of breath.  Abdominal pain. EXAM: PORTABLE ABDOMEN - 1 VIEW COMPARISON:  Jun 24, 2016 FINDINGS: The distal tip of the NG tube is near the gastric antrum. Mild opacity in left lung base will be better assessed on today's chest x-ray. No free air or portal venous gas identified although evaluation is limited on supine imaging. No bowel obstruction. IMPRESSION: No interval change or acute abnormality in the abdomen. Electronically Signed   By: Dorise Bullion III M.D   On: 06/25/2016 07:54   Dg Abd Portable 1v  Result Date: 06/24/2016 CLINICAL DATA:  Abdominal distention. EXAM: PORTABLE ABDOMEN - 1 VIEW COMPARISON:  06/23/2016 . FINDINGS: NG tube noted in stable position with tip in the stomach . Surgical clips right upper quadrant. Several nonspecific loops of air-filled small bowel again noted. Colonic gas pattern is normal. No free air. IMPRESSION: 1.  NG tube in stable position. 2. Several nonspecific loops of air-filled small bowel again noted. No evidence of progressive bowel distention. Colonic gas pattern  normal. No free air. Electronically Signed   By: Marcello Moores  Register   On: 06/24/2016 08:06   Dg Abd Portable 1v  Result Date: 06/23/2016 CLINICAL DATA:  61 year old female with shortness of breath and abdominal pain. 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 Being treated conservatively at this time, including with bowel rest. EXAM: PORTABLE ABDOMEN - 1 VIEW COMPARISON:  Abdominal radiographs 06/22/2016. CTA 06/20/2016, and earlier. FINDINGS: Portable AP supine view at 0759 hours. Stable NG tube, side hole to level of the gastric body and tip at the level of the gastric antrum. Stable cholecystectomy clips. Stable bowel gas pattern with several gas containing nondilated small and large bowel loops. No definite pneumoperitoneum on this supine view. Abdominal and pelvic visceral contours appear stable. No acute osseous abnormality identified. IMPRESSION: 1. Stable NG tube position. 2. Stable, nonobstructed bowel-gas pattern. Electronically Signed   By: Genevie Ann M.D.   On: 06/23/2016 08:18   Dg Abd Portable 1v  Result Date: 06/22/2016 CLINICAL DATA:  Abdominal pain for few days with nausea and vomiting. EXAM: PORTABLE ABDOMEN - 1 VIEW COMPARISON:  06/22/2016 abdominal radiograph. FINDINGS: Enteric tube loops in the gastric fundus and terminates in the body of the stomach, without appreciable kink. Top-normal caliber small bowel loops throughout the abdomen. Minimal colonic stool. No evidence of pneumatosis or pneumoperitoneum. No radiopaque urolithiasis. Cholecystectomy clips are seen in the right upper quadrant of the abdomen. Patchy opacities at the lung bases. IMPRESSION: 1. Enteric tube loops in the gastric fundus and terminates in the body of the stomach without appreciable kink. 2. Top-normal  caliber small bowel loops, unchanged. 3. Patchy bibasilar lung opacities, correlate with chest radiograph.  Electronically Signed   By: Ilona Sorrel M.D.   On: 06/22/2016 16:58   Dg Abd Portable 1v  Result Date: 06/22/2016 CLINICAL DATA:  Vomiting.  Shortness of breath.  AAA . EXAM: PORTABLE ABDOMEN - 1 VIEW COMPARISON:  06/21/2016. FINDINGS: Surgical clips right upper quadrant. NG tube noted with tip over the distal stomach. No bowel distention. No acute bony abnormality identified. Mild basilar atelectasis. IMPRESSION: NG tube noted with its tip over the distal stomach. No bowel distention or acute intra-abdominal abnormality identified. Electronically Signed   By: Marcello Moores  Register   On: 06/22/2016 07:30   Dg Abd Portable 1v  Result Date: 06/21/2016 CLINICAL DATA:  Feeding tube placement EXAM: PORTABLE ABDOMEN - 1 VIEW COMPARISON:  06/21/2016 FINDINGS: NG tube coils in the fundus of the stomach with the tip in the distal stomach. Decompression of the stomach. IMPRESSION: NG tube tip in the distal stomach. Electronically Signed   By: Rolm Baptise M.D.   On: 06/21/2016 15:46   Dg Abd Portable 1v  Result Date: 06/21/2016 CLINICAL DATA:  Ileus  Vomiting that started this AM per patient EXAM: PORTABLE ABDOMEN - 1 VIEW COMPARISON:  06/20/2016 FINDINGS: Gaseous distention of the stomach. Paucity of small bowel and colonic gas. Cholecystectomy clips. Linear scarring/ atelectasis in the lung bases. IMPRESSION: 1. Gaseous distention of the stomach. Electronically Signed   By: Lucrezia Europe M.D.   On: 06/21/2016 11:54   Dg Abd Portable 1v  Result Date: 06/20/2016 CLINICAL DATA:  Aortic dissection EXAM: PORTABLE ABDOMEN - 1 VIEW COMPARISON:  CT abdomen/ pelvis dated 06/20/2016 FINDINGS: Nonobstructive bowel gas pattern. Visualized osseous structures are within normal limits. IMPRESSION: Unremarkable abdominal radiograph. Electronically Signed   By: Julian Hy M.D.   On: 06/20/2016 17:02   Ct Angio Chest/abd/pel For Dissection W And/or W/wo  Result Date: 06/27/2016 CLINICAL DATA:  61 year old female with a  history of type B dissection EXAM: CT ANGIOGRAPHY CHEST, ABDOMEN AND PELVIS TECHNIQUE: Multidetector CT imaging through the chest, abdomen and pelvis was performed using the standard protocol during bolus administration of intravenous contrast. Multiplanar reconstructed images and MIPs were obtained and reviewed to evaluate the vascular anatomy. CONTRAST:  100 cc Isovue 370 COMPARISON:  CT 06/20/2016 FINDINGS: CTA CHEST FINDINGS Cardiovascular: Heart: Heart size unchanged. No pericardial fluid/ thickening. No significant coronary calcifications. Aorta: Re- demonstration of type B dissection with the entry tear appearing to originate just beyond the origin of the left subclavian artery. Branch vessels remain patent without extension of the dissection flap into the branch vessels. Caliber and contour of the ascending aorta unremarkable without evidence of retrograde extension. Greatest diameter of the ascending aorta 2.9 cm. Inflammatory changes surrounding the distal aortic arch in the proximal descending aorta. Greatest diameter of the distal aortic arch measures approximately 3.4 cm. Greatest diameter on the comparison CT approximately 3.0 cm. No aneurysm of the descending thoracic aorta with the greatest diameter of the false lumen measuring 2.7 cm. True lumen is compressed. There is a fenestration in the distal true lumen at the level of the diaphragm just above the aortic hiatus. Pulmonary arteries: No lobar, segmental, or proximal subsegmental filling defects. Mediastinum/Nodes: No mediastinal hemorrhage. Mediastinal lymph nodes are present. Gastric tube within the esophagus. Lungs/Pleura: Ground-glass opacities developing through the the bilateral lungs. Developing interlobular septal thickening. Atelectasis of the medial segment left lower lobe. Small low-density left pleural effusion trace right-sided pleural effusion  and associated atelectasis. No pneumothorax. Right upper extremity PICC appears to  terminate superior vena cava. Review of the MIP images confirms the above findings. CTA ABDOMEN AND PELVIS FINDINGS VASCULAR Aorta: Re- demonstration of dissection flap of the abdominal aorta. No aneurysm.  No periaortic fluid of the abdomen. The true lumen is compressed throughout the abdomen. True lumen contributes to celiac artery origin, superior mesenteric artery origin, and also continues across the origin of the inferior mesenteric artery. The false lumen contributes to perfusion of the right renal artery. The lateral margin of the dissection flap involves the origin of 2 left renal arteries both superior and inferior. True lumen is decompressed in the inferior aorta extending into the bilateral iliac arteries. The bilateral common iliac arteries appear perfused from the false lumen bilaterally. False lumen extends in the bilateral external iliac artery. Likely re- entry tear of distal external iliac arteries bilaterally, on the right image 173, on the left image 171. Dissection flap extends into the bilateral hypogastric arteries which are partially patent with decreased flow. Celiac: Origin of the celiac artery originates from the true lumen which is decompressed. Celiac artery remains patent at this time including the branch vessels. Typical branch pattern of splenic artery, left gastric artery, common hepatic artery. SMA: Superior mesenteric artery origin originates from the true lumen which is compressed. SMA remains patent at this time. Renals: Right renal artery originates from the false lumen. Right renal artery is perfusing at this time with uniform perfusion of the right kidney. There are 2 left renal arteries, superior and inferior. Both arteries originate near the margin of the dissection flap. The superior left renal artery is partially filling, at the in flexion point of the dissection flap, perfusing superior kidney. The inferior left renal artery appears thrombosed, new from the comparison  with enlarging left renal infarction, predominantly of the lower pole. IMA: Origin of the inferior mesenteric artery is thrombosed given the collapsed true lumen at this level. There is re- constitution of the distal inferior mesenteric artery via collateral flow. Right lower extremity: False lumen perfuses the right common iliac artery with collapse of the true lumen. Dissection flap extends into the hypogastric artery, with the distal pelvic branch is partially opacifying. External iliac artery is perfusing from the false lumen, with unremarkable appearance of the distal external iliac artery, common femoral artery, profunda femoris, SFA. There is the appearance of a re- entry tear in the mid right external iliac artery. Left lower extremity: False lumen perfuses the left common iliac artery with collapse of the true lumen. Dissection extends into the hypogastric artery which is partially perfused with opacification of pelvic vessels. Distal external iliac artery an the common femoral artery unremarkable, with patent proximal femoral vessels. There is the appearance of a re- entry tear in the mid left external iliac artery. Veins: Unremarkable appearance of the venous system. Review of the MIP images confirms the above findings. NON-VASCULAR Hepatobiliary: Unremarkable appearance of the liver. Cholecystectomy Pancreas: Unremarkable appearance of the pancreas. No pericholecystic fluid or inflammatory changes. Unremarkable ductal system. Spleen: Unremarkable. Adrenals/Urinary Tract: Right adrenal gland unremarkable. Nodule of the left adrenal gland which measures 2.7 cm. Right: Right-sided kidney perfuses uniformly with no hydronephrosis. Left: Progressive left renal infarct with small portion of the superior kidney perfusing. The remainder of the left kidney is hypoperfused, progressed from the comparison. No hydronephrosis. Urinary catheter within the urinary bladder. Stomach/Bowel: Unremarkable appearance of  stomach with gastric tube in place. Small bowel borderline dilated and  fluid-filled. No transition point. The wall of the small bowel relatively uniformly thin and enhancing, although the study is timed for the arterial phase. No focal wall thickening. No abnormally distended colon. No transition point. Fluid filled colon. No focal wall thickening or pericolonic inflammatory changes. Lymphatic: Multiple lymph nodes in the para-aortic nodal station, none of which are enlarged. Mesenteric: No free fluid or air. No adenopathy. Reproductive: Hysterectomy Other: No hernia. Musculoskeletal: No displaced fracture. Degenerative changes of the spine. 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 Only 1 possible fenestration is identified, in the distal thoracic aorta above the aortic hiatus. There is enlarging diameter of the distal aortic arch with associated inflammatory changes, measuring approximately 3.5 on today's study compared to approximately 3.1 cm previously. The appearance is concerning for progression/expansion of intramural hematoma. Progressing left renal infarction, with near complete thrombosis of superior and inferior renal arteries, both of which originate at the margin of the dissection flap. Bilateral common iliac arteries appear to be perfused from the false lumen, with complete collapse of true lumen proximally. There does appear to be re- entry to the true lumen in the mid external iliac artery bilaterally, as there is unremarkable appearance of the proximal femoral vasculature including bilateral common iliac arteries. The 3 mesenteric vessels originate from the small true lumen, which is progressively compressed. Celiac artery and superior mesenteric artery remain patent, with occlusion at the origin of the inferior mesenteric artery. Three vessel arch, with all 3 branches remain patent. No evidence of extension of the  dissection flap into the branch vessels. These preliminary results were discussed by telephone at the time of interpretation on 06/27/2016 at 12:41 pm with Dr. Curt Jews. Borderline dilated small bowel without transition point or obstruction. Findings may represent ileus and/or nonspecific enteritis. No focal wall thickening to suggest Elin Fenley ischemia, although the timing of this CTA is specific for arterial evaluation and not the bowel tissues. If ongoing concern for bowel ischemia, would repeat abd/pelvis contrast enhanced-CT portal venous phase. Similar appearance of mixed geographic ground-glass opacities of the bilateral lungs with mild interlobular septal thickening. Again, differential diagnosis includes pulmonary edema, ARDS, atypical infection. Signed, Dulcy Fanny. Earleen Newport, DO [facility] Radiology Electronically Signed   By: Corrie Mckusick D.O.   On: 06/27/2016 13:18   Ct Angio Chest/abd/pel For Dissection W And/or W/wo  Result Date: 06/20/2016 CLINICAL DATA:  Substernal chest pain radiating to back EXAM: CT ANGIOGRAPHY CHEST, ABDOMEN AND PELVIS TECHNIQUE: Multidetector CT imaging through the chest, abdomen and pelvis was performed using the standard protocol during bolus administration of intravenous contrast. Multiplanar reconstructed images and MIPs were obtained and reviewed to evaluate the vascular anatomy. CONTRAST:  100 cc Isovue 370 IV COMPARISON:  CT abdomen and pelvis 10/27/2011 FINDINGS: CTA CHEST FINDINGS Cardiovascular: There is the type B aortic dissection beginning in the distal aortic arch just beyond the origin of the great vessels. The true lumen is compressed by the false lumen. No aneurysm. No pulmonary embolus. Heart is normal size. Mediastinum/Nodes: No mediastinal, hilar, or axillary adenopathy. Lungs/Pleura: Atelectasis or scarring in the lingula. Lungs otherwise clear. No effusions. Musculoskeletal: No acute bony abnormality. Review of  the MIP images confirms the above findings. CTA ABDOMEN AND PELVIS FINDINGS VASCULAR Aorta: Dissection continues throughout the abdominal aorta with the celiac artery and superior mesenteric artery arising from the true lumen anteriorly. The dissection may extend into the left renal artery where  there is only a small amount of blood flow noted in the proximal and mid left renal artery. Right renal artery appears to arise from the false lumen and is patent. The the true lumen appears thrombosed below the origin of the superior mesenteric artery. Inferior mesenteric artery arises from the thrombosed true lumen and appears occluded proximally, reconstitutes several cm from the origin. Celiac: Patent. SMA: Patent. Renals: As above. IMA: As above. Inflow: To the dissection continues into both common iliac arteries with thrombosed true lumens. The dissection appears to terminate at approximately the level of the common iliac bifurcation bilaterally. Veins: Grossly patent and unremarkable. Review of the MIP images confirms the above findings. NON-VASCULAR Hepatobiliary: Mild diffuse fatty infiltration. Prior cholecystectomy. Pancreas: No focal abnormality or ductal dilatation. Spleen: No focal abnormality.  Normal size. Adrenals/Urinary Tract: Nodules in the left adrenal gland are low-density on the precontrast imaging compatible with small adenomas. There are areas of non perfusion noted in the mid and lower poles of the left kidney compatible with infarction, likely related to the involvement of the left renal artery by dissection. Urinary bladder unremarkable. Stomach/Bowel: Stomach, large and small bowel grossly unremarkable. Lymphatic: No adenopathy. Reproductive: Prior hysterectomy.  No adnexal masses. Other: No free fluid or free air. Musculoskeletal: No acute bony abnormality. Review of the MIP images confirms the above findings. IMPRESSION: Type B aortic dissection beginning just beyond the origin of the great  vessels from the aortic arch. This involves the descending thoracic aorta, abdominal aorta and iliac vessels. The true lumen is compressed by the larger false lumen, and is thrombosed below the SMA/renal artery origins. The dissection appears to extend into the left renal artery with partial occlusion and areas of infarct in the mid and lower pole of the left kidney. Fatty infiltration of the liver. Critical Value/emergent results were called by telephone at the time of interpretation on 06/20/2016 at 10:13 am to Dr. Fredia Sorrow , who verbally acknowledged these results. Electronically Signed   By: Rolm Baptise M.D.   On: 06/20/2016 10:15   Anti-infectives: Anti-infectives    Start     Dose/Rate Route Frequency Ordered Stop   06/26/16 0830  meropenem (MERREM) 1 g in sodium chloride 0.9 % 100 mL IVPB     1 g 200 mL/hr over 30 Minutes Intravenous Every 8 hours 06/26/16 0810     06/22/16 1800  clindamycin (CLEOCIN) IVPB 600 mg  Status:  Discontinued     600 mg 100 mL/hr over 30 Minutes Intravenous Every 8 hours 06/22/16 1723 06/26/16 0810   06/22/16 1000  azithromycin (ZITHROMAX) 250 mg in dextrose 5 % 125 mL IVPB  Status:  Discontinued     250 mg 125 mL/hr over 60 Minutes Intravenous Every 24 hours 06/22/16 0826 06/27/16 1019   06/22/16 0900  levofloxacin (LEVAQUIN) IVPB 500 mg  Status:  Discontinued     500 mg 100 mL/hr over 60 Minutes Intravenous Every 24 hours 06/22/16 0759 06/22/16 0824      Assessment/Plan: s/p * No surgery found * Slow recovery of GI function. Discussed with husband present. Continue current support   LOS: 10 days   Antony Sian 06/30/2016, 8:01 AM

## 2016-06-30 NOTE — Progress Notes (Addendum)
06/30/2016  RN concern for possible rhythm change due to multiple P waves on monitor, rate regular, questionable A-flutter, EKG performed. Placed on chart. Pt. Asymptomatic. VSS. Pt. Having chills at the time of change, questionable artifact due to muscle movement.  Not present in both EKG leads. EKG showing NSR. Upon resolution of chills, rhythm returned to normal appearance. Will continue to closely monitor patient.  Lilyanne Mcquown, Arville Lime

## 2016-06-30 NOTE — Progress Notes (Signed)
CC:  Abdominal pain  Subjective: NG clamped intermittently yesterday, no significant nausea, Sump has fluid in it.  What is in the cannister, is gastric.  Her husband says she had a fair amount of ice yesterday without nausea.  He abdomen is still very distended.  Not much in the way of BS.  Some tenderness remains, better than before.    Objective: Vital signs in last 24 hours: Temp:  [97.6 F (36.4 C)-98.1 F (36.7 C)] 98 F (36.7 C) (05/29 0400) Pulse Rate:  [62-75] 73 (05/29 0700) Resp:  [11-25] 20 (05/29 0700) BP: (99-152)/(47-68) 135/68 (05/29 0700) SpO2:  [91 %-99 %] 97 % (05/29 0700) Weight:  [84.7 kg (186 lb 11.7 oz)] 84.7 kg (186 lb 11.7 oz) (05/29 0415) Last BM Date: 06/20/16  4200 IV Nothing PO recorded Urine 2795  700 from the NG Afebrile, VSS Creatinine stable, WBC trending down CXR yesterday:  Bilateral vascular congestion and edema is seen stable from prior exam. Some atelectasis remains in the left retrocardiac region. CXR today:  Persistent mild diffuse interstitial infiltrates with atelectasis versus consolidation in LEFT lower lobe.   Intake/Output from previous day: 05/28 0701 - 05/29 0700 In: 4199.6 [I.V.:3519.6; NG/GT:30; IV Piggyback:650] Out: 3300 [Urine:2795; Emesis/NG output:700] Intake/Output this shift: No intake/output data recorded.  General appearance: alert, cooperative and no distress GI: distended, not allot of BS, or flatus, no BM.  still some what tender, but in the RLQ when I examined her this AM  Lab Results:   Recent Labs  06/29/16 0336 06/30/16 0415  WBC 22.9* 18.4*  HGB 11.2* 10.7*  HCT 34.4* 33.7*  PLT 152 164    BMET  Recent Labs  06/29/16 0336 06/30/16 0415  NA 143 142  K 3.2* 4.0  CL 112* 114*  CO2 22 22  GLUCOSE 150* 154*  BUN 56* 55*  CREATININE 1.30* 1.17*  CALCIUM 8.5* 8.5*   PT/INR No results for input(s): LABPROT, INR in the last 72 hours.   Recent Labs Lab 06/26/16 0535 06/26/16 1715  06/27/16 0340 06/28/16 0347 06/29/16 0336  AST 68* 58* 55* 56* 52*  ALT 174* 134* 125* 95* 73*  ALKPHOS 100 100 128* 146* 133*  BILITOT 0.6 0.9 0.8 0.9 1.0  PROT 5.4* 5.2* 5.6* 5.6* 5.8*  ALBUMIN 2.4* 2.1* 2.2* 1.9* 1.9*     Lipase     Component Value Date/Time   LIPASE 38 06/20/2016 0815     Medications: . arformoterol  15 mcg Nebulization BID  . bisacodyl  10 mg Rectal q morning - 10a  . budesonide (PULMICORT) nebulizer solution  0.5 mg Nebulization BID  . chlorhexidine  15 mL Mouth Rinse BID  . Chlorhexidine Gluconate Cloth  6 each Topical Daily  . cloNIDine  0.3 mg Transdermal Weekly  . fentaNYL  50 mcg Transdermal Q72H  . furosemide  40 mg Intravenous Daily  . insulin aspart  0-24 Units Subcutaneous Q4H  . insulin glargine  35 Units Subcutaneous BID  . mouth rinse  15 mL Mouth Rinse q12n4p  . metoprolol tartrate  10 mg Intravenous Q4H  . nitroGLYCERIN  0.4 mg Transdermal Daily  . pantoprazole (PROTONIX) IV  40 mg Intravenous Q24H  . sodium chloride flush  10-40 mL Intracatheter Q12H   . Marland KitchenTPN (CLINIMIX-E) Adult 83 mL/hr at 06/29/16 2000  . sodium chloride    . esmolol 200 mcg/kg/min (06/30/16 0630)  . 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 Gastroparesis  Left Renal infarct Increasing leukocytosis Wt up 3 Kg last 2 days Tobacco use GERD IBS Hypertension Type II diabetes FEN: TNA/NPO/NG - clamping trial again yesterday ID: Azithromycin/Clindamycin 5/21 =>>day 5 Converted to Meropenem 5/25 =>> day 5 DVT: SCD's    Plan:  Leave NG clamped for now, give her some sips and chips from the floor and see  how she does.  She still appears to have an ileus like picture to me.    LOS: 10 days    Cherril Hett 06/30/2016 [phone]

## 2016-06-30 NOTE — Progress Notes (Addendum)
Nutrition Follow Up  DOCUMENTATION CODES:   Obesity unspecified  INTERVENTION:   TPN per pharmacy Diet advancement as able  NUTRITION DIAGNOSIS:   Inadequate oral intake related to altered GI function as evidenced by NPO status, ongoing  GOAL:   Patient will meet greater than or equal to 90% of their needs, met  MONITOR:   Diet advancement, PO intake, Labs, Weight trends, Skin, I & O's  ASSESSMENT:   Pt with acute aortic dissection originating at the proximal descending thoracic aorta. Pt admitted for pain control, blood pressure control, bed rest and IV hydration.   Course complicated by hypoxia from PNA, ileus, and AKI.  Started on TPN 5/22. Medications reviewed.  Labs reviewed.  Triglycerides 167 5/28 NGT clamped today. Pt allowed ice chips. Spoke with pt and her husband.  CBG's [phone]  Current TPN prescription: Clinimix E 5/15 @ 83 ml/hr.  Lipids are currently being held due to high triglycerides (stopped 5/23 5 am).  Provides 1413 kcal and 100 gm protein per day.  Meets 71% minimum estimated energy needs and 91% minimum estimated protein needs.  Diet Order:  Diet NPO time specified Except for: Other (See Comments) .TPN (CLINIMIX-E) Adult TPN (CLINIMIX-E) Adult  Skin:  Reviewed, no issues  Last BM:  5/29  Height:   Ht Readings from Last 1 Encounters:  06/20/16 '5\' 3"'  (1.6 m)   Weight:   Wt Readings from Last 1 Encounters:  06/30/16 186 lb 11.7 oz (84.7 kg)   Ideal Body Weight:  52.2 kg  BMI:  Body mass index is 33.08 kg/m.  Estimated Nutritional Needs:   Kcal:  2000-2200  Protein:  110-120 gm  Fluid:  per MD  EDUCATION NEEDS:   No education needs identified at this time  [place], [place], [place] Pager [phone] After Hours Pager

## 2016-06-30 NOTE — Progress Notes (Signed)
PULMONARY / CRITICAL CARE MEDICINE   Name: Haley Graham MRN: [id number] DOB: 30-Mar-1955    ADMISSION DATE:  06/20/2016 CONSULTATION DATE:  06/26/16  REFERRING MD:  Dr. 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  Course complicated by hypoxia from pneumonia, ileus, AKI.  SUBJECTIVE:  Breathing better today, no complaints.   VITAL SIGNS: BP (!) 138/59   Pulse 68   Temp 97.7 F (36.5 C) (Oral)   Resp 19   Ht 5\' 3"  (1.6 m)   Wt 84.7 kg (186 lb 11.7 oz)   SpO2 100%   BMI 33.08 kg/m   INTAKE / OUTPUT: I/O last 3 completed shifts: In: 6004 [I.V.:5104; NG/GT:100; IV Piggyback:800] Out: 6378 [Urine:4070; Emesis/NG output:1000]  PHYSICAL EXAMINATION: General:  Overweight female in NAD Neuro:  Alert, oriented, non-focal. Flat affect HEENT:  [place]/AT, No JVD noted, PERRL Cardiovascular:  RRR, no MRG Lungs:  Clear Abdomen:  Soft, non-distended Musculoskeletal:  No acute deformity Skin:  Intact, MMM   LABS:  BMET  Recent Labs Lab 06/28/16 0347 06/29/16 0336 06/30/16 0415  NA 139 143 142  K 3.4* 3.2* 4.0  CL 109 112* 114*  CO2 22 22 22   BUN 55* 56* 55*  CREATININE 1.35* 1.30* 1.17*  GLUCOSE 169* 150* 154*    Electrolytes  Recent Labs Lab 06/26/16 0535  06/27/16 0340 06/28/16 0347 06/29/16 0336 06/30/16 0415  CALCIUM 8.7*  < > 8.8* 8.6* 8.5* 8.5*  MG 1.8  --  1.9  --  1.9  --   PHOS 4.3  --  4.3  --  4.4  --   < > = values in this interval not displayed.  CBC  Recent Labs Lab 06/28/16 0347 06/29/16 0336 06/30/16 0415  WBC 30.6* 22.9* 18.4*  HGB 11.6* 11.2* 10.7*  HCT 34.6* 34.4* 33.7*  PLT 138* 152 164    Coag's No results for input(s): APTT, INR in the last 168 hours.  Sepsis Markers  Recent Labs Lab 06/26/16 1553 06/27/16 0340 06/28/16 0347  LATICACIDVEN 1.5 1.3 1.5    ABG  Recent  Labs Lab 06/24/16 0332 06/26/16 1512 06/28/16 1615  PHART 7.430 7.429 7.430  PCO2ART 33.1 32.3 31.2*  PO2ART 68.9* 66.0* 64.0*    Liver Enzymes  Recent Labs Lab 06/27/16 0340 06/28/16 0347 06/29/16 0336  AST 55* 56* 52*  ALT 125* 95* 73*  ALKPHOS 128* 146* 133*  BILITOT 0.8 0.9 1.0  ALBUMIN 2.2* 1.9* 1.9*    Cardiac Enzymes No results for input(s): TROPONINI, PROBNP in the last 168 hours.  Glucose  Recent Labs Lab 06/29/16 1141 06/29/16 1502 06/29/16 2011 06/29/16 2345 06/30/16 0417 06/30/16 0842  GLUCAP 183* 177* 129* 173* 135* 171*    Imaging Dg Chest Port 1 View  Result Date: 06/30/2016 CLINICAL DATA:  Breath, pneumonia, aortic aneurysm, GERD, hypertension, diabetes mellitus EXAM: PORTABLE CHEST 1 VIEW COMPARISON:  Portable exam 0633 hours compared to 06/29/2016 FINDINGS: Nasogastric tube coiled in proximal stomach. RIGHT arm PICC line tip projects over SVC above cavoatrial junction. Upper normal heart size. Mediastinal contours normal. Mild pulmonary vascular congestion. Persistent diffuse BILATERAL infiltrates. Atelectasis versus consolidation LEFT lower lobe. No pneumothorax. IMPRESSION: Persistent mild diffuse interstitial infiltrates with atelectasis versus consolidation in LEFT lower lobe. Electronically Signed   By: Lavonia Dana M.D.   On: 06/30/2016 06:58  STUDIES:  CTA Chest, ABD, Pelvis 07/08/2022 >> Type B aortic dissection beginning just beyond the origin of the great vessels from the aortic arch.  This involves the descending thoracic aorta, abdominal aorta and iliac vessels. The true lumen is compressed by the larger false lumen, and is thrombosed below the SMA/renal artery origins. The dissection appears to extend into the left renal artery with partial occlusion and areas of infarct in the mid and lower pole of the left kidney.  CT Chest w/o 5/25 >> diffuse bilateral patchy airspace disease, mutli-segment atelectasis at the bases & small pleural  effusions, ? Infiltrate LLL  CT ABD/Pelvis 5/25 >> diffuse small bowel & colonic distention with fluid levels as seen with ileus, ? Underlying bowel ischemia given patients known aortic dissection with proximal IMA occlusion and SMA flow via the narrow true lumen  CT CHEST /ABd/Pelvis 5/26>Type B aortic dissection, c/by left renal artery compromise/renal infarction. Progression on left renal infarct. enlarging aortic arch diameter ?progression of intramural hematoma , Messenteric vessels progressively compressed, w/ occlusion of IMA. Dilated small bowel w/out obstruction ? Ileus. Similar GGO in bilateral lungs.   CULTURES: BCx2 5/21 >> NEG  UC 5/25 >> negative BC 5/25 >  ANTIBIOTICS: Azithro 5/21 >> 5/25  Clinda 5/21 >> 5/25 Meropenem 5/25 >>   SIGNIFICANT EVENTS: 5/19  Admit with sudden sharp chest pain > Type B Dissection   LINES/TUBES: RUE PICC 5/20 >>   DISCUSSION: 61 y/o F admitted with Type B aortic dissection.  Course complicated by ileus, hypoxia with pneumonia.  ASSESSMENT / PLAN:  Acute hypoxic respiratory failure from PNA, pulmonary edema. Tobacco abuse. - oxygen to keep SpO2 > 92% - f/u CXR - scheduled BDs - continue Abx (meropenem) - Be more aggressive with diuresis. Will order additional dose lasix in PM to make BID  Type B aortic dissection. Hx of HTN. - cont catapres patch, lasix, lopressor, NTG patch, esmolol gtt - Per CVTS, Vascular surgery  Acute renal failure. Hyperkalemia.>resolved  Hypokalemia  - monitor I&O - f/u BMET  Ileus: CT abd/pelvis - dilated small bowel w/o obstruction  - continue TPN - Surgery following   Thrombocytopenia.-improving  - f/u CBC  DM type II. - SSI  Anxiety. Pain control. - prn ativan - duragesic  DVT prophylaxis - SCDs SUP - protonix Nutrition - TNA Goals of care - full code  Patient and husband updated bedside.  Georgann Housekeeper, AGACNP-BC [facility] Pager [phone] or [phone]  06/30/2016 10:27 AM   STAFF NOTE: Linwood Dibbles, MD FACP have personally reviewed patient's available data, including medical history, events of note, physical examination and test results as part of my evaluation. I have discussed with resident/NP and other care providers such as pharmacist, RN and RRT. In addition, I personally evaluated patient and elicited key findings of: awake , fc, no distress, lungs with coarse ronchi, distant BS, pulses equal, abdo soft, edema min trace, I reviewed all CT and pcxr c/w lacy apical predominant int infiltrates with denser alveolar infiltrates bases, she remeains to be controlled on esmolol for HTn control, consider addition oral agents further when able to take oral, she is on TPN for ileus, her pcxr and CT are c/e PNA, I worry about atypical infection, ensure leg urien done, ensure aztirho was given for full course, meropenem is per surgery for abdomen concerns, will  Need 8 days total anx coverage for pna, O2 needs are improved, would favor neg balance gioven ground glass apical predm  changes on CT chest and pcxr, I updated pt and family in [place]. Titus Mould, MD, [place] Pgr: [facility] 06/30/2016 10:52 AM

## 2016-07-01 ENCOUNTER — Inpatient Hospital Stay (HOSPITAL_COMMUNITY): Payer: BLUE CROSS/BLUE SHIELD

## 2016-07-01 DIAGNOSIS — I7102 Dissection of abdominal aorta: Secondary | ICD-10-CM

## 2016-07-01 LAB — GLUCOSE, CAPILLARY
Glucose-Capillary: 155 mg/dL — ABNORMAL HIGH (ref 65–99)
Glucose-Capillary: 156 mg/dL — ABNORMAL HIGH (ref 65–99)
Glucose-Capillary: 170 mg/dL — ABNORMAL HIGH (ref 65–99)
Glucose-Capillary: 185 mg/dL — ABNORMAL HIGH (ref 65–99)
Glucose-Capillary: 191 mg/dL — ABNORMAL HIGH (ref 65–99)
Glucose-Capillary: 192 mg/dL — ABNORMAL HIGH (ref 65–99)
Glucose-Capillary: 196 mg/dL — ABNORMAL HIGH (ref 65–99)
Glucose-Capillary: 204 mg/dL — ABNORMAL HIGH (ref 65–99)

## 2016-07-01 LAB — BASIC METABOLIC PANEL
Anion gap: 9 (ref 5–15)
BUN: 51 mg/dL — ABNORMAL HIGH (ref 6–20)
CO2: 25 mmol/L (ref 22–32)
Calcium: 8.3 mg/dL — ABNORMAL LOW (ref 8.9–10.3)
Chloride: 108 mmol/L (ref 101–111)
Creatinine, Ser: 1.34 mg/dL — ABNORMAL HIGH (ref 0.44–1.00)
GFR calc Af Amer: 49 mL/min — ABNORMAL LOW (ref >60)
GFR calc non Af Amer: 42 mL/min — ABNORMAL LOW (ref >60)
Glucose, Bld: 218 mg/dL — ABNORMAL HIGH (ref 65–99)
Potassium: 3 mmol/L — ABNORMAL LOW (ref 3.5–5.1)
Sodium: 142 mmol/L (ref 135–145)

## 2016-07-01 LAB — CULTURE, BLOOD (SINGLE): Culture: NO GROWTH

## 2016-07-01 LAB — LEGIONELLA PNEUMOPHILA SEROGP 1 UR AG: L. pneumophila Serogp 1 Ur Ag: NEGATIVE

## 2016-07-01 MED ORDER — POTASSIUM CHLORIDE 10 MEQ/50ML IV SOLN
10.0000 meq | INTRAVENOUS | Status: AC
  Administered 2016-07-01 (×6): 10 meq via INTRAVENOUS
  Filled 2016-07-01 (×7): qty 50

## 2016-07-01 MED ORDER — SIMETHICONE 80 MG PO CHEW
80.0000 mg | CHEWABLE_TABLET | Freq: Four times a day (QID) | ORAL | Status: DC | PRN
  Administered 2016-07-06 – 2016-08-21 (×23): 80 mg via ORAL
  Filled 2016-07-01 (×23): qty 1

## 2016-07-01 MED ORDER — TRACE MINERALS CR-CU-MN-SE-ZN 10-1000-500-60 MCG/ML IV SOLN
INTRAVENOUS | Status: AC
  Administered 2016-07-01: 18:00:00 via INTRAVENOUS
  Filled 2016-07-01: qty 1992

## 2016-07-01 MED ORDER — FAT EMULSION 20 % IV EMUL
240.0000 mL | INTRAVENOUS | Status: AC
  Administered 2016-07-01: 240 mL via INTRAVENOUS
  Filled 2016-07-01: qty 250

## 2016-07-01 NOTE — Progress Notes (Signed)
Patient ID: Haley Graham, female   DOB: January 16, 1956, 61 y.o.   MRN: [id number] Was up walking some yesterday. Still very anxious. Asking if she is going to die. Discussed with the patient and the husband present. Making slow but steady progress. Bowel movement yesterday. Mobilizing some.  Abdominal exam today is unchanged with mild diffuse tenderness and no rebound. Continue current support.

## 2016-07-01 NOTE — Progress Notes (Signed)
07/01/2016 10:34 AM Pt. With productive cough, sputum green and thick, similar to secretions from NGT. PT. Still c/o nausea and abdominal pain. PRN nausea medication administered as well as prn pain medication. PA Brooke with [facility] paged and made aware. Pt. Placed back to NPO status. NGT left to low continuous suction for time being per orders. Will continue to closely monitor patient.  Nedda Gains, Arville Lime

## 2016-07-01 NOTE — Progress Notes (Addendum)
07/01/2016 0800 Noted orders to d/c NGT. Upon assessment this am, pt. Describes severe nausea and abdominal pain. Describes as "gas pains" that are "making her nauseous". Suction turned back on to NGT and immediately returned 300 ml of bile. Attempt to call on call MD, forwarded to PA. Received call back from Saucier with [facility]. Verbal order leave NPO for now and allow pt. To still try clear liquid diet. Orders enacted. Pt. Updated on plan of care. Will continue to closely monitor patient.  Rutha Melgoza, Arville Lime

## 2016-07-01 NOTE — Progress Notes (Signed)
Physical Therapy Treatment Patient Details Name: Haley Graham MRN: [id number] DOB: Sep 22, 1955 Today's Date: 07/01/2016    History of Present Illness Pt adm with type B aortic dissection. Treated non surgically with BP control and pain control. Pt developed pancreatitis and ileus with NG tube placed. PMH - anxiety, arthritis    PT Comments    Patient not progressing this session, but able to tolerate EOB with encouragement and support as well as short standing activity despite poor sleep and abdominal pain.  Feel she may need higher level of care if medical issues continue to progress weakness.  PT to follow acutely.   Follow Up Recommendations  SNF     Equipment Recommendations  3in1 (PT);Rolling walker with 5" wheels    Recommendations for Other Services       Precautions / Restrictions Precautions Precautions: Fall Precaution Comments: NG to suction    Mobility  Bed Mobility Overal bed mobility: Needs Assistance Bed Mobility: Supine to Sit;Sit to Supine     Supine to sit: Mod assist Sit to supine: Mod assist   General bed mobility comments: assist for legs off bed and for lifting trunk, to supine assist for legs into bed  Transfers Overall transfer level: Needs assistance Equipment used: Rolling walker (2 wheeled) Transfers: Sit to/from Stand Sit to Stand: Mod assist         General transfer comment: lifting assist from EOB  Ambulation/Gait Ambulation/Gait assistance: Min assist Ambulation Distance (Feet): 2 Feet Assistive device: Rolling walker (2 wheeled) Gait Pattern/deviations: Step-to pattern     General Gait Details: side stepping up to [facility] with assist for balance, walker management   Stairs            Wheelchair Mobility    Modified Rankin (Stroke Patients Only)       Balance Overall balance assessment: Needs assistance Sitting-balance support: Feet unsupported Sitting balance-Leahy Scale: Fair Sitting balance - Comments:  reported feeling like passing out at EOB, BP WNL, sat about 8 minutes while adjusting equipment, getting BP, etc, but at times provided support due to feeing pre-syncopal   Standing balance support: Bilateral upper extremity supported Standing balance-Leahy Scale: Poor Standing balance comment: UE support needed due to pain and weakness                            Cognition Arousal/Alertness: Lethargic Behavior During Therapy: Flat affect Overall Cognitive Status: Impaired/Different from baseline Area of Impairment: Following commands;Attention                   Current Attention Level: Sustained   Following Commands: Follows one step commands inconsistently;Follows one step commands with increased time     Problem Solving: Slow processing;Decreased initiation;Requires verbal cues;Requires tactile cues General Comments: more lethargic this session, son reports poor sleep overnight      Exercises General Exercises - Lower Extremity Ankle Circles/Pumps: AROM;10 reps;Both;Supine Heel Slides: AAROM;Both;5 reps;Supine    General Comments        Pertinent Vitals/Pain Faces Pain Scale: Hurts even more Pain Location: abdomen (NG clamped this session) Pain Descriptors / Indicators: Discomfort;Grimacing Pain Intervention(s): Monitored during session;Limited activity within patient's tolerance;Repositioned    Home Living                      Prior Function            PT Goals (current goals can now be found in the care  plan section) Progress towards PT goals: Not progressing toward goals - comment (due to weakness, pain)    Frequency    Min 3X/week      PT Plan Discharge plan needs to be updated    Co-evaluation              AM-PAC PT "6 Clicks" Daily Activity  Outcome Measure  Difficulty turning over in bed (including adjusting bedclothes, sheets and blankets)?: A Little Difficulty moving from lying on back to sitting on the side of  the bed? : Total Difficulty sitting down on and standing up from a chair with arms (e.g., wheelchair, bedside commode, etc,.)?: Total Help needed moving to and from a bed to chair (including a wheelchair)?: A Lot Help needed walking in hospital room?: A Little Help needed climbing 3-5 steps with a railing? : A Lot 6 Click Score: 12    End of Session Equipment Utilized During Treatment: Oxygen Activity Tolerance: Patient limited by fatigue;Patient limited by pain Patient left: in bed;with call bell/phone within reach;with family/visitor present Nurse Communication: Other (comment) PT Visit Diagnosis: Muscle weakness (generalized) (M62.81);Other abnormalities of gait and mobility (R26.89)     Time: 8250-5397 PT Time Calculation (min) (ACUTE ONLY): 26 min  Charges:  $Therapeutic Exercise: 8-22 mins $Therapeutic Activity: 8-22 mins                    G CodesMagda Kiel, [place] [phone] 07/01/2016    Reginia Naas 07/01/2016, 10:18 AM

## 2016-07-01 NOTE — Progress Notes (Signed)
  PHARMACY - ADULT TOTAL PARENTERAL NUTRITION CONSULT NOTE   Pharmacy Consult for TPN Indication: prolonged NPO status/high NG output   Patient Measurements: Height: _0  (160 cm) Weight: 189 lb 9.5 oz (86 kg) IBW/kg (Calculated) : 52.4 TPN AdjBW (KG): 60.3 Body mass index is 33.59 kg/m. 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 Patient had a significant hx of N/V for 2-4 weeks prior to admission. Weight appears to have been maintained. Patient had a NG tube placed 5/20 which had significant output with 1-1.5 L a day.   GI: NG tube in place for drainage (About 100 mL out 5/29- trending down) Minimal abdominal tenderness. Prealbumin down to 9.0 and albumin 1.9.  Phenergan Q 6 prn. Bisacodyl every morning. Possible NG removal today. Last BM: 5/29. Trialing clears today. Endo: Hx of DM. No insulin at home. CBGs somewhat controlled (155-193) on Lantus and SSI. No hypoglycemic events. Insulin requirements in the past 24 hours: 26 units of aspart; 70 units of Lantus  Lytes: Na-142, K down to 3.0 (3 runs on 5/29), CoCa-9.9, Mg and Phos wnl.  Renal:AKI: Scr 1.17>>1.34 (Baseline < 1). BUN down to 51. UOP good. I/Os are mostly neutral this admit. Pulm: 3 L [place]; [place]; Brovana  Cards: Stanford type B aortic dissection; BP soft-WNL, HR 70s-80s in NSR. Esmolol and labetalol gtt off , lopressor  10 mg Q 4 , clonidine 0.3 mg patch , nitroglycerin 0.34m patch, lasix 40 bid, hydralazine 10 mg Q 4 added 5/29. Pt had period of rhythm change 5/29 PM. Hepatobil:AST/ALT trending down 52/73. Alk phos slightly elevated, Tbili wnl. Triglycerides back down to 167 Neuro: Hydromorphone prn IDJ:[id number] Meropenem (D#6) for respiratory failure from PNA. Clindamycin and azithromycin stopped. WBC elevated but down to 18.4 (peaked at 36.7) Afebrile. No growth on cultures  Best Practices:PPI TPN Access:PICC line placed 5/20 TPN  start date:5/22  Nutritional Goals (per RD recommendation on 5/25): KCal: 2000-2200 kcal/day  Protein: 110-120 gm/day   Goal  TPN rate: Clinimix 5/15 E @ 83 ml/hr + lipid emulsion 20 ml/hr   Current Nutrition:  Clear liquid diet  TPN  Plan:  Continue Clinimix E 5/15 at 83 ml/hr Continue lipid emulsion 20% at 20 ml/hr  This provides 100 g of protein and 1894 kCals per day meeting 91% of protein and 95% of kCal needs Continue MVI in TPN Continue TE in TPN every other day, next 5/30 Continue Lantus at 35 units BID Continue resistant SSI and adjust as needed F/U ability to transition to TFs Monitor TPN labs, CMET, Mg, and Phos   Give 60 meQ of potassium chloride for replacement with increased lasix   EUvaldo Bristle PharmD PGY1 Pharmacy Resident  Pager: [phone]/30/2018 7:29 AM

## 2016-07-01 NOTE — Progress Notes (Signed)
  Subjective: NG tube clamped >>>.. Nausea, placed to suction with 900cc out BUT large loose BM this pm excellent urine output BP control now off iv esmolol , labetolol  Objective: Vital signs in last 24 hours: Temp:  [96.7 F (35.9 C)-99.4 F (37.4 C)] 97.7 F (36.5 C) (05/30 1500) Pulse Rate:  [68-85] 69 (05/30 1900) Cardiac Rhythm: Normal sinus rhythm (05/30 1700) Resp:  [14-26] 19 (05/30 1900) BP: (105-145)/(48-66) 117/57 (05/30 1900) SpO2:  [92 %-100 %] 97 % (05/30 1900) Weight:  [189 lb 9.5 oz (86 kg)] 189 lb 9.5 oz (86 kg) (05/30 0500)  Hemodynamic parameters for last 24 hours:  nsr  Intake/Output from previous day: 05/29 0701 - 05/30 0700 In: 3215.1 [I.V.:2675.1; NG/GT:90; IV Piggyback:450] Out: 9163 [Urine:4375; Emesis/NG output:100] Intake/Output this shift: No intake/output data recorded.  abd soft w/o guarding extrem warm  Lab Results:  Recent Labs  06/29/16 0336 06/30/16 0415  WBC 22.9* 18.4*  HGB 11.2* 10.7*  HCT 34.4* 33.7*  PLT 152 164   BMET:  Recent Labs  06/30/16 0415 07/01/16 0845  NA 142 142  K 4.0 3.0*  CL 114* 108  CO2 22 25  GLUCOSE 154* 218*  BUN 55* 51*  CREATININE 1.17* 1.34*  CALCIUM 8.5* 8.3*    PT/INR: No results for input(s): LABPROT, INR in the last 72 hours. ABG    Component Value Date/Time   PHART 7.430 06/28/2016 1615   HCO3 20.7 06/28/2016 1615   TCO2 22 06/28/2016 1615   ACIDBASEDEF 3.0 (H) 06/28/2016 1615   O2SAT 93.0 06/28/2016 1615   CBG (last 3)   Recent Labs  07/01/16 0909 07/01/16 1253 07/01/16 1656  GLUCAP 192* 196* 191*    Assessment/Plan: S/P  Cont TNA CBC in am   LOS: 11 days    [name] 07/01/2016

## 2016-07-01 NOTE — Progress Notes (Signed)
   Subjective/Chief Complaint: CC loose BM yesterday, NGT clamped all day, no nausea, less abd pain   Objective: Vital signs in last 24 hours: Temp:  [97.6 F (36.4 C)-99.4 F (37.4 C)] 99.4 F (37.4 C) (05/30 0300) Pulse Rate:  [63-85] 74 (05/30 0700) Resp:  [13-29] 16 (05/30 0700) BP: (70-165)/(41-121) 136/58 (05/30 0700) SpO2:  [93 %-100 %] 99 % (05/30 0700) Weight:  [86 kg (189 lb 9.5 oz)] 86 kg (189 lb 9.5 oz) (05/30 0500) Last BM Date: 06/30/16  Intake/Output from previous day: 05/29 0701 - 05/30 0700 In: 3215.1 [I.V.:2675.1; NG/GT:90; IV Piggyback:450] Out: 3299 [Urine:4375; Emesis/NG output:100] Intake/Output this shift: No intake/output data recorded.  General appearance: cooperative Resp: clear to auscultation bilaterally Cardio: regular rate and rhythm GI: soft, minimal tenderness RLQ, +BS  Lab Results:   Recent Labs  06/29/16 0336 06/30/16 0415  WBC 22.9* 18.4*  HGB 11.2* 10.7*  HCT 34.4* 33.7*  PLT 152 164   BMET  Recent Labs  06/29/16 0336 06/30/16 0415  NA 143 142  K 3.2* 4.0  CL 112* 114*  CO2 22 22  GLUCOSE 150* 154*  BUN 56* 55*  CREATININE 1.30* 1.17*  CALCIUM 8.5* 8.5*   PT/INR No results for input(s): LABPROT, INR in the last 72 hours. ABG  Recent Labs  06/28/16 1615  PHART 7.430  HCO3 20.7    Studies/Results: Dg Chest Port 1 View  Result Date: 06/30/2016 CLINICAL DATA:  Breath, pneumonia, aortic aneurysm, GERD, hypertension, diabetes mellitus EXAM: PORTABLE CHEST 1 VIEW COMPARISON:  Portable exam 0633 hours compared to 06/29/2016 FINDINGS: Nasogastric tube coiled in proximal stomach. RIGHT arm PICC line tip projects over SVC above cavoatrial junction. Upper normal heart size. Mediastinal contours normal. Mild pulmonary vascular congestion. Persistent diffuse BILATERAL infiltrates. Atelectasis versus consolidation LEFT lower lobe. No pneumothorax. IMPRESSION: Persistent mild diffuse interstitial infiltrates with atelectasis  versus consolidation in LEFT lower lobe. Electronically Signed   By: Lavonia Dana M.D.   On: 06/30/2016 06:58    Anti-infectives: Anti-infectives    Start     Dose/Rate Route Frequency Ordered Stop   06/26/16 0830  meropenem (MERREM) 1 g in sodium chloride 0.9 % 100 mL IVPB     1 g 200 mL/hr over 30 Minutes Intravenous Every 8 hours 06/26/16 0810     06/22/16 1800  clindamycin (CLEOCIN) IVPB 600 mg  Status:  Discontinued     600 mg 100 mL/hr over 30 Minutes Intravenous Every 8 hours 06/22/16 1723 06/26/16 0810   06/22/16 1000  azithromycin (ZITHROMAX) 250 mg in dextrose 5 % 125 mL IVPB  Status:  Discontinued     250 mg 125 mL/hr over 60 Minutes Intravenous Every 24 hours 06/22/16 0826 06/27/16 1019   06/22/16 0900  levofloxacin (LEVAQUIN) IVPB 500 mg  Status:  Discontinued     500 mg 100 mL/hr over 60 Minutes Intravenous Every 24 hours 06/22/16 0759 06/22/16 2426      Assessment/Plan: 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 Gastroparesis  Left Renal infarct Tobacco use GERD IBS Hypertension Type II diabetes FEN: D/C NGT, try clears, on TNA ID: Azithromycin/Clindamycin 5/21 =>>day 5 Converted to Meropenem 5/25 =>> DVT: SCD's   LOS: 11 days    Haley Graham E 07/01/2016

## 2016-07-02 ENCOUNTER — Inpatient Hospital Stay (HOSPITAL_COMMUNITY): Payer: BLUE CROSS/BLUE SHIELD

## 2016-07-02 DIAGNOSIS — I7102 Dissection of abdominal aorta: Secondary | ICD-10-CM

## 2016-07-02 LAB — CBC
HCT: 34.5 % — ABNORMAL LOW (ref 36.0–46.0)
Hemoglobin: 11.1 g/dL — ABNORMAL LOW (ref 12.0–15.0)
MCH: 33 pg (ref 26.0–34.0)
MCHC: 32.2 g/dL (ref 30.0–36.0)
MCV: 102.7 fL — ABNORMAL HIGH (ref 78.0–100.0)
Platelets: 264 10*3/uL (ref 150–400)
RBC: 3.36 MIL/uL — ABNORMAL LOW (ref 3.87–5.11)
RDW: 14.4 % (ref 11.5–15.5)
WBC: 13.7 10*3/uL — ABNORMAL HIGH (ref 4.0–10.5)

## 2016-07-02 LAB — COMPREHENSIVE METABOLIC PANEL
ALT: 83 U/L — AB (ref 14–54)
ANION GAP: 10 (ref 5–15)
AST: 70 U/L — ABNORMAL HIGH (ref 15–41)
Albumin: 1.8 g/dL — ABNORMAL LOW (ref 3.5–5.0)
Alkaline Phosphatase: 151 U/L — ABNORMAL HIGH (ref 38–126)
BUN: 51 mg/dL — ABNORMAL HIGH (ref 6–20)
CALCIUM: 8.5 mg/dL — AB (ref 8.9–10.3)
CHLORIDE: 108 mmol/L (ref 101–111)
CO2: 24 mmol/L (ref 22–32)
CREATININE: 1.29 mg/dL — AB (ref 0.44–1.00)
GFR, EST AFRICAN AMERICAN: 51 mL/min — AB (ref >60)
GFR, EST NON AFRICAN AMERICAN: 44 mL/min — AB (ref >60)
Glucose, Bld: 188 mg/dL — ABNORMAL HIGH (ref 65–99)
Potassium: 3.1 mmol/L — ABNORMAL LOW (ref 3.5–5.1)
SODIUM: 142 mmol/L (ref 135–145)
Total Bilirubin: 1.3 mg/dL — ABNORMAL HIGH (ref 0.3–1.2)
Total Protein: 6.5 g/dL (ref 6.5–8.1)

## 2016-07-02 LAB — GLUCOSE, CAPILLARY
Glucose-Capillary: 171 mg/dL — ABNORMAL HIGH (ref 65–99)
Glucose-Capillary: 191 mg/dL — ABNORMAL HIGH (ref 65–99)
Glucose-Capillary: 196 mg/dL — ABNORMAL HIGH (ref 65–99)
Glucose-Capillary: 200 mg/dL — ABNORMAL HIGH (ref 65–99)
Glucose-Capillary: 203 mg/dL — ABNORMAL HIGH (ref 65–99)
Glucose-Capillary: 213 mg/dL — ABNORMAL HIGH (ref 65–99)

## 2016-07-02 LAB — PHOSPHORUS: Phosphorus: 3.8 mg/dL (ref 2.5–4.6)

## 2016-07-02 LAB — MAGNESIUM: Magnesium: 2.1 mg/dL (ref 1.7–2.4)

## 2016-07-02 MED ORDER — METOPROLOL TARTRATE 50 MG PO TABS
50.0000 mg | ORAL_TABLET | Freq: Two times a day (BID) | ORAL | Status: DC
  Administered 2016-07-02 (×2): 50 mg via ORAL
  Filled 2016-07-02 (×2): qty 1

## 2016-07-02 MED ORDER — CHLORHEXIDINE GLUCONATE CLOTH 2 % EX PADS
6.0000 | MEDICATED_PAD | Freq: Every day | CUTANEOUS | Status: DC
  Administered 2016-07-02 – 2016-07-12 (×10): 6 via TOPICAL

## 2016-07-02 MED ORDER — INSULIN GLARGINE 100 UNIT/ML ~~LOC~~ SOLN
40.0000 [IU] | Freq: Two times a day (BID) | SUBCUTANEOUS | Status: DC
  Administered 2016-07-02 – 2016-07-17 (×31): 40 [IU] via SUBCUTANEOUS
  Filled 2016-07-02 (×32): qty 0.4

## 2016-07-02 MED ORDER — M.V.I. ADULT IV INJ
INTRAVENOUS | Status: AC
  Administered 2016-07-02: 18:00:00 via INTRAVENOUS
  Filled 2016-07-02: qty 1992

## 2016-07-02 MED ORDER — POTASSIUM CHLORIDE 10 MEQ/50ML IV SOLN
10.0000 meq | INTRAVENOUS | Status: AC
  Administered 2016-07-02 (×4): 10 meq via INTRAVENOUS
  Filled 2016-07-02 (×3): qty 50

## 2016-07-02 MED ORDER — SODIUM CHLORIDE 0.9 % IV SOLN
30.0000 meq | INTRAVENOUS | Status: AC
  Administered 2016-07-02 (×2): 30 meq via INTRAVENOUS
  Filled 2016-07-02 (×2): qty 15

## 2016-07-02 MED ORDER — LABETALOL HCL 5 MG/ML IV SOLN
0.5000 mg/min | INTRAVENOUS | Status: DC
  Filled 2016-07-02: qty 100

## 2016-07-02 MED ORDER — CLONAZEPAM 1 MG PO TABS
1.0000 mg | ORAL_TABLET | Freq: Every day | ORAL | Status: DC
  Administered 2016-07-02 – 2016-08-21 (×45): 1 mg via ORAL
  Filled 2016-07-02 (×46): qty 1

## 2016-07-02 MED ORDER — POTASSIUM CHLORIDE 10 MEQ/50ML IV SOLN: 10.0000 meq | INTRAVENOUS | Status: DC

## 2016-07-02 MED ORDER — LABETALOL HCL 5 MG/ML IV SOLN
5.0000 mg | Freq: Once | INTRAVENOUS | Status: DC
  Filled 2016-07-02 (×2): qty 4

## 2016-07-02 MED ORDER — FAT EMULSION 20 % IV EMUL
240.0000 mL | INTRAVENOUS | Status: AC
  Administered 2016-07-02: 240 mL via INTRAVENOUS
  Filled 2016-07-02: qty 250

## 2016-07-02 NOTE — Progress Notes (Signed)
PULMONARY / CRITICAL CARE MEDICINE   Name: Haley Graham MRN: [id number] DOB: 1955/05/06    ADMISSION DATE:  06/20/2016 CONSULTATION DATE:  06/26/16  REFERRING MD:  Dr. Nils Pyle   CHIEF COMPLAINT:  Bilateral Infiltrates, Hypoxic Respiratory Failure   Brief:   61 yo female with chest pain and found to have type B aortic dissection.  Course complicated by hypoxia from pneumonia, ileus, AKI.  SUBJECTIVE:  On 2L [place]. Reports no dyspnea or cough.   VITAL SIGNS: BP (!) 126/54   Pulse 78   Temp 97.9 F (36.6 C) (Oral)   Resp 18   Ht 5\' 3"  (1.6 m)   Wt 85.7 kg (188 lb 15 oz)   SpO2 97%   BMI 33.47 kg/m   INTAKE / OUTPUT: I/O last 3 completed shifts: In: 3967.9 [P.O.:50; I.V.:3307.9; NG/GT:60; IV Piggyback:550] Out: 6950 [Urine:5950; Emesis/NG output:1000]  PHYSICAL EXAMINATION: General appearance: female, no acute distress Eyes:PERRL, EOMI bilaterally. Mouth:  membranes and no mucosal ulcerations Neck: Trachea midline; neck supple, no JVD Lungs/chest: CTA, with normal respiratory effort CV: RRR, no MRGs  Abdomen: Soft, tender; active bowel sounds  Extremities: No peripheral edema Skin: Normal temperature, turgor and texture; no rash, ulcers or subcutaneous nodules Psych: alert and oriented to person, place and time   LABS:  BMET  Recent Labs Lab 06/30/16 0415 07/01/16 0845 07/02/16 0407  NA 142 142 142  K 4.0 3.0* 3.1*  CL 114* 108 108  CO2 22 25 24   BUN 55* 51* 51*  CREATININE 1.17* 1.34* 1.29*  GLUCOSE 154* 218* 188*    Electrolytes  Recent Labs Lab 06/27/16 0340  06/29/16 0336 06/30/16 0415 07/01/16 0845 07/02/16 0407  CALCIUM 8.8*  < > 8.5* 8.5* 8.3* 8.5*  MG 1.9  --  1.9  --   --  2.1  PHOS 4.3  --  4.4  --   --  3.8  < > = values in this interval not displayed.  CBC  Recent Labs Lab 06/29/16 0336 06/30/16 0415 07/02/16 0407  WBC 22.9* 18.4* 13.7*  HGB 11.2* 10.7* 11.1*  HCT 34.4* 33.7* 34.5*  PLT 152 164 264    Coag's No  results for input(s): APTT, INR in the last 168 hours.  Sepsis Markers  Recent Labs Lab 06/26/16 1553 06/27/16 0340 06/28/16 0347  LATICACIDVEN 1.5 1.3 1.5    ABG  Recent Labs Lab 06/26/16 1512 06/28/16 1615  PHART 7.429 7.430  PCO2ART 32.3 31.2*  PO2ART 66.0* 64.0*    Liver Enzymes  Recent Labs Lab 06/28/16 0347 06/29/16 0336 07/02/16 0407  AST 56* 52* 70*  ALT 95* 73* 83*  ALKPHOS 146* 133* 151*  BILITOT 0.9 1.0 1.3*  ALBUMIN 1.9* 1.9* 1.8*    Cardiac Enzymes No results for input(s): TROPONINI, PROBNP in the last 168 hours.  Glucose  Recent Labs Lab 07/01/16 1253 07/01/16 1656 07/01/16 1915 07/01/16 2337 07/02/16 0357 07/02/16 0857  GLUCAP 196* 191* 204* 170* 191* 203*    Imaging Dg Abd Portable 1v  Result Date: 07/02/2016 CLINICAL DATA:  Nasogastric tube removed.  Acute abdominal pain. EXAM: PORTABLE ABDOMEN - 1 VIEW COMPARISON:  06/27/2016 FINDINGS: Nasogastric tube is been removed. There is a group of dilated fluid and air-filled loops of small intestine in the left abdomen with apparent wall thickening. This could be due to partial obstruction or enteritis, including ischemic bowel insult. IMPRESSION: Nasogastric tube removed. Abnormal dilated small intestine in the left central abdomen. Differential diagnosis partial small bowel obstruction  versus enteritis, including ischemic. Electronically Signed   By: Nelson Chimes M.D.   On: 07/02/2016 07:37     STUDIES:  CTA Chest, ABD, Pelvis 07-18-22 > Type B aortic dissection beginning just beyond the origin of the great vessels from the aortic arch.  This involves the descending thoracic aorta, abdominal aorta and iliac vessels. The true lumen is compressed by the larger false lumen, and is thrombosed below the SMA/renal artery origins. The dissection appears to extend into the left renal artery with partial occlusion and areas of infarct in the mid and lower pole of the left kidney. CT Chest w/o 5/25 >  diffuse bilateral patchy airspace disease, mutli-segment atelectasis at the bases & small pleural effusions, ? Infiltrate LLL CT ABD/Pelvis 5/25 > diffuse small bowel & colonic distention with fluid levels as seen with ileus, ? Underlying bowel ischemia given patients known aortic dissection with proximal IMA occlusion and SMA flow via the narrow true lumen CT CHEST /ABd/Pelvis 5/26>Type B aortic dissection, c/by left renal artery compromise/renal infarction. Progression on left renal infarct. enlarging aortic arch diameter ?progression of intramural hematoma , Messenteric vessels progressively compressed, w/ occlusion of IMA. Dilated small bowel w/out obstruction ? Ileus. Similar GGO in bilateral lungs.   CULTURES: BCx2 5/21 > NEG  UC 5/25 > negative BC 5/25 >Negative  BC 5/27 > Negative   ANTIBIOTICS: Azithro 5/21 >> 5/25  Clinda 5/21 >> 5/25 Meropenem 5/25 >>   SIGNIFICANT EVENTS: 5/19  Admit with sudden sharp chest pain > Type B Dissection   LINES/TUBES: RUE PICC 5/20 >>   DISCUSSION: 61 y/o F admitted with Type B aortic dissection.  Course complicated by ileus, hypoxia with pneumonia.  ASSESSMENT / PLAN:  Acute hypoxic respiratory failure from PNA, + pulmonary edema (Now negative 1.5L). Tobacco abuse. - Maintain Oxygen > 92% (Now on 2L [place])  - Trend CXR - Continue scheduled Pulmicort and Brovana  - Meropenem (Day 7)    Type B aortic dissection. G1DD - EF 70-80 Hx of HTN. - Per CVTS/Vascular surgery - Cardiac Monitoring  - Cont catapres patch, lasix, lopressor, NTG patch   Acute renal failure. Volume Overload - Improving now 1.5L -Trend BMP -Replace electorlyes   Ileus: CT abd/pelvis - dilated small bowel w/o obstruction  -Surgery following  -continue TPN  Thrombocytopenia - Resolved  -Trend CBC   DM type II. - Trend Glucose  - SSI  Anxiety. Pain control. - prn ativan - duragesic  DVT prophylaxis - SCDs SUP - protonix Nutrition - TPN  Goals of care -  full code  Patient and daughter updated at bedside.   Hayden Pedro, AGACNP-BC [facility]  Pgr: [phone]  PCCM Pgr: [phone]   STAFF NOTE: Linwood Dibbles, MD FACP have personally reviewed patient's available data, including medical history, events of note, physical examination and test results as part of my evaluation. I have discussed with resident/NP and other care providers such as pharmacist, RN and RRT. In addition, I personally evaluated patient and elicited key findings of: awake, alert, improved coarse BS bilateral, no distress, all prior pcxr reviewed, she has received a full course ABX for cap / atypical infection and does not require any further abx for PNA, understanding the abdominal ischemia concerns on meropenem, she is clinicaly better on 2 liters and was successfully neg 2.6 liters, her last pcxr 5/30 appeared as edema, would maintain neg balance further, willsign off, call if needed  Lavon Paganini. Titus Mould, MD, [place] Pgr: [phone]  [facility] 07/02/2016 1:55 PM

## 2016-07-02 NOTE — Progress Notes (Signed)
CSW spoke with pt and pt spouse at bedside about PT recommendation for SNF.  Patient not interested in SNF at this time and is interested in returning home at time of DC with home services.  CSW signing off at this time- please reconsult if patient becomes agreeable.  Jorge Ny, LCSW Clinical Social Worker [phone]

## 2016-07-02 NOTE — Progress Notes (Signed)
Dr. Georgette Dover text-paged regarding pt removing NG tube. He called back, said to leave NG tube out and monitor nausea/vomitting.   Will continue to monitor.

## 2016-07-02 NOTE — Progress Notes (Signed)
  [facility] NOTE   Pharmacy Consult for TPN Indication: prolonged NPO status/high NG output   Patient Measurements: Height: _0  (160 cm) Weight: 188 lb 15 oz (85.7 kg) IBW/kg (Calculated) : 52.4 TPN AdjBW (KG): 60.7 Body mass index is 33.47 kg/m. 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 Patient had a significant hx of N/V for 2-4 weeks prior to admission. Weight appears to have been maintained. Patient had a NG tube placed 5/20 which had significant output with 1-1.5 L a day.   GI: NG tube removed 5/30. Minimal abdominal tenderness and nausea. Prealbumin down to 9.0 and albumin down to 1.8.  Phenergan Q 6 prn, bisacodyl, simethicone.Last BM: 5/30. Trialed clears yesterday- Patient with productive green sputum and nausea. Placed back to NPO. Trialing clears again today. Abdominal x-ray on 5/31 shows abnormal dilated small intestine - SBO vs. Enteritis  Endo: Hx of DM. No insulin at home. CBGs somewhat controlled (170-204) on Lantus and SSI. No hypoglycemic events. Insulin requirements in the past 24 hours: 26 units of aspart; 70 units of Lantus  Lytes: Na-142, K up to 3.1 (ELink giving 4 runs 5/31), CoCa-10.2, Mg and Phos wnl.  Renal:AKI: Scr 1.34>>1.29 (Baseline < 1). BUN stable 51. UOP good. - 1.5 L from admission Pulm: 2 L [place]; Dulera; Brovana, Albuterol  Cards: Stanford type B aortic dissection; BP soft-WNL, HR 70s-80s in NSR. Lopressor  10 mg Q 4 , clonidine 0.3 mg patch , nitroglycerin 0.18m patch, lasix 40 bid, hydralazine 10 mg Q 4.  Hepatobil:AST/ALT up to 70/83. Alk phos up to 151, Tbili up to 1.3. Triglycerides back down to 167 Neuro: Hydromorphone prn IBP:JPETKKOECon Meropenem (D#7) for respiratory failure from PNA. Clindamycin and azithromycin stopped. WBC down to 13.7 (peaked at 36.7) Afebrile. No growth on cultures  Best Practices:PPI TPN  Access:PICC line placed 5/20 TPN start date:5/22  Nutritional Goals (per RD recommendation on 5/25): KCal: 2000-2200 kcal/day  Protein: 110-120 gm/day   Goal  TPN rate: Clinimix 5/15 E @ 83 ml/hr + lipid emulsion 20 ml/hr   Current Nutrition:   TPN Clear liquid diet   Plan:  Continue Clinimix E 5/15 at 83 ml/hr Continue lipid emulsion 20% at 20 ml/hr  This provides 100 g of protein and 1894 kCals per day meeting 91% of protein and 95% of kCal needs Continue MVI in TPN Continue TE in TPN every other day, next 6/1 Increase Lantus to 40 units BID Continue resistant SSI and adjust as needed F/U ability to transition to TFs/advance diet  Monitor BMET   Give 60 meQ of potassium to make today's total 100 meQ for replacement- Discussed with CVTS.   EUvaldo Bristle PharmD PGY1 Pharmacy Resident  Pager: [phone]/31/2018 7:38 AM

## 2016-07-02 NOTE — Progress Notes (Signed)
  [facility] Progress Note  Subjective    Had BM today. Had some nausea earlier. Now feeling ok. No abdominal pain.   Objective Vitals:   07/02/16 1100 07/02/16 1130  BP: (!) 134/57 (!) 143/62  Pulse: 77 82  Resp: (!) 25 (!) 22  Temp:      Intake/Output Summary (Last 24 hours) at 07/02/16 1136 Last data filed at 07/02/16 0911  Gross per 24 hour  Intake          2501.33 ml  Output             3625 ml  Net         -1123.67 ml   Abdomen soft with mild diffuse tenderness diffusely. Palpable DP pulses bilaterally.   Assessment/Planning: 61 y.o. female with type B dissection  No abdominal pain. Having BMs.  NG tube pulled out by patient last night. Started on clears.  No lower extremity symptoms.  WBC improving.   Alvia Grove 07/02/2016 11:36 AM --  Laboratory CBC    Component Value Date/Time   WBC 13.7 (H) 07/02/2016 0407   HGB 11.1 (L) 07/02/2016 0407   HCT 34.5 (L) 07/02/2016 0407   PLT 264 07/02/2016 0407    BMET    Component Value Date/Time   NA 142 07/02/2016 0407   K 3.1 (L) 07/02/2016 0407   CL 108 07/02/2016 0407   CO2 24 07/02/2016 0407   GLUCOSE 188 (H) 07/02/2016 0407   BUN 51 (H) 07/02/2016 0407   CREATININE 1.29 (H) 07/02/2016 0407   CALCIUM 8.5 (L) 07/02/2016 0407   GFRNONAA 44 (L) 07/02/2016 0407   GFRAA 51 (L) 07/02/2016 0407    COAG No results found for: INR, PROTIME No results found for: PTT  Antibiotics Anti-infectives    Start     Dose/Rate Route Frequency Ordered Stop   06/26/16 0830  meropenem (MERREM) 1 g in sodium chloride 0.9 % 100 mL IVPB     1 g 200 mL/hr over 30 Minutes Intravenous Every 8 hours 06/26/16 0810     06/22/16 1800  clindamycin (CLEOCIN) IVPB 600 mg  Status:  Discontinued     600 mg 100 mL/hr over 30 Minutes Intravenous Every 8 hours 06/22/16 1723 06/26/16 0810   06/22/16 1000  azithromycin (ZITHROMAX) 250 mg in dextrose 5 % 125 mL IVPB  Status:  Discontinued     250 mg 125 mL/hr  over 60 Minutes Intravenous Every 24 hours 06/22/16 0826 06/27/16 1019   06/22/16 0900  levofloxacin (LEVAQUIN) IVPB 500 mg  Status:  Discontinued     500 mg 100 mL/hr over 60 Minutes Intravenous Every 24 hours 06/22/16 0759 06/22/16 McLain, PA-C [facility] Office: [phone] Pager: [phone] 07/02/2016 11:36 AM

## 2016-07-02 NOTE — Progress Notes (Signed)
[facility] ADULT ICU REPLACEMENT PROTOCOL FOR AM LAB REPLACEMENT ONLY  The patient does apply for the [facility] Adult ICU Electrolyte Replacment Protocol based on the criteria listed below:   1. Is GFR >/= 40 ml/min? Yes.    Patient's GFR today is 44 2. Is urine output >/= 0.5 ml/kg/hr for the last 6 hours? Yes.   Patient's UOP is 1.83 ml/kg/hr 3. Is BUN < 60 mg/dL? Yes.    Patient's BUN today is 51 4. Abnormal electrolyte(s):Potassium 3.1 5. Ordered repletion with: Potassium per protocol 6. If a panic level lab has been reported, has the CCM MD in charge been notified? No..   Physician:    Adam Phenix 07/02/2016 6:02 AM

## 2016-07-02 NOTE — Progress Notes (Signed)
Subjective/Chief Complaint: CC pulled out NGT overnight, did not sleep well, had large BM   Objective: Vital signs in last 24 hours: Temp:  [96.7 F (35.9 C)-98.4 F (36.9 C)] 98.3 F (36.8 C) (05/31 0405) Pulse Rate:  [68-91] 77 (05/31 0700) Resp:  [14-28] 18 (05/31 0700) BP: (106-147)/(45-66) 109/46 (05/31 0700) SpO2:  [90 %-100 %] 97 % (05/31 0750) Weight:  [85.7 kg (188 lb 15 oz)] 85.7 kg (188 lb 15 oz) (05/31 0500) Last BM Date: 07/01/16  Intake/Output from previous day: 05/30 0701 - 05/31 0700 In: 2744.2 [P.O.:50; I.V.:2184.2; NG/GT:60; IV Piggyback:450] Out: 5885 [OYDXA:1287; Emesis/NG output:1000] Intake/Output this shift: No intake/output data recorded.  General appearance: alert and cooperative Resp: clear to auscultation bilaterally Cardio: regular rate and rhythm GI: soft, some dist, not sig tender, +BS  Lab Results:   Recent Labs  06/30/16 0415 07/02/16 0407  WBC 18.4* 13.7*  HGB 10.7* 11.1*  HCT 33.7* 34.5*  PLT 164 264   BMET  Recent Labs  07/01/16 0845 07/02/16 0407  NA 142 142  K 3.0* 3.1*  CL 108 108  CO2 25 24  GLUCOSE 218* 188*  BUN 51* 51*  CREATININE 1.34* 1.29*  CALCIUM 8.3* 8.5*   PT/INR No results for input(s): LABPROT, INR in the last 72 hours. ABG No results for input(s): PHART, HCO3 in the last 72 hours.  Invalid input(s): PCO2, PO2  Studies/Results: Dg Chest Port 1 View  Result Date: 07/01/2016 CLINICAL DATA:  Shortness of breath common pneumonia, thoracic aortic dissection EXAM: PORTABLE CHEST 1 VIEW COMPARISON:  Portable chest x-ray of Jun 30, 2016 FINDINGS: The lungs are adequately inflated. The pulmonary interstitial markings are less prominent. The retrocardiac region remains dense and there is remains partial obscuration of the left hemidiaphragm. The heart is top-normal in size. The mediastinum is normal in width. The esophagogastric tube tip projects below the inferior margin of the image. The right PICC line  tip projects over the midportion of the SVC. IMPRESSION: Slight interval improvement in the appearance of the pulmonary interstitium suggests decreasing interstitial edema. There is persistent left lower lobe atelectasis or pneumonia. Electronically Signed   By: David  Martinique M.D.   On: 07/01/2016 07:35   Dg Abd Portable 1v  Result Date: 07/02/2016 CLINICAL DATA:  Nasogastric tube removed.  Acute abdominal pain. EXAM: PORTABLE ABDOMEN - 1 VIEW COMPARISON:  06/27/2016 FINDINGS: Nasogastric tube is been removed. There is a group of dilated fluid and air-filled loops of small intestine in the left abdomen with apparent wall thickening. This could be due to partial obstruction or enteritis, including ischemic bowel insult. IMPRESSION: Nasogastric tube removed. Abnormal dilated small intestine in the left central abdomen. Differential diagnosis partial small bowel obstruction versus enteritis, including ischemic. Electronically Signed   By: Nelson Chimes M.D.   On: 07/02/2016 07:37    Anti-infectives: Anti-infectives    Start     Dose/Rate Route Frequency Ordered Stop   06/26/16 0830  meropenem (MERREM) 1 g in sodium chloride 0.9 % 100 mL IVPB     1 g 200 mL/hr over 30 Minutes Intravenous Every 8 hours 06/26/16 0810     06/22/16 1800  clindamycin (CLEOCIN) IVPB 600 mg  Status:  Discontinued     600 mg 100 mL/hr over 30 Minutes Intravenous Every 8 hours 06/22/16 1723 06/26/16 0810   06/22/16 1000  azithromycin (ZITHROMAX) 250 mg in dextrose 5 % 125 mL IVPB  Status:  Discontinued     250 mg 125  mL/hr over 60 Minutes Intravenous Every 24 hours 06/22/16 0826 06/27/16 1019   06/22/16 0900  levofloxacin (LEVAQUIN) IVPB 500 mg  Status:  Discontinued     500 mg 100 mL/hr over 60 Minutes Intravenous Every 24 hours 06/22/16 0759 06/22/16 0932      Assessment/Plan: 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 - abd x-ray this AM still some dilated loops on L Left Renal infarct Tobacco use GERD IBS Hypertension Type II diabetes FEN:  start clears, on TNA ID: Azithromycin/Clindamycin 5/21 =>>day 5 Converted to Meropenem 5/25 =>> I spoke with Dr. Prescott Gum and her husband.  LOS: 12 days    Doha Boling E 07/02/2016

## 2016-07-02 NOTE — Progress Notes (Signed)
  Subjective: NG tube out- taking po liquids- will start po lopressor for BP control and stop scheduled iv dosing WBC better- 13 K on meropenum for prob translocation sepsis from ischemic bowel, acute hepatic insufficiency from type B aortic dissection Cont TPN until sure bowel status ok Goal for daily ambulation Objective: Vital signs in last 24 hours: Temp:  [97.2 F (36.2 C)-98.4 F (36.9 C)] 97.2 F (36.2 C) (05/31 1302) Pulse Rate:  [68-91] 82 (05/31 1300) Cardiac Rhythm: Normal sinus rhythm (05/31 0800) Resp:  [14-28] 23 (05/31 1300) BP: (108-147)/(45-66) 144/49 (05/31 1300) SpO2:  [90 %-100 %] 100 % (05/31 1300) Weight:  [188 lb 15 oz (85.7 kg)] 188 lb 15 oz (85.7 kg) (05/31 0500)  Hemodynamic parameters for last 24 hours:   Nsr, afebrile Intake/Output from previous day: 05/30 0701 - 05/31 0700 In: 2744.2 [P.O.:50; I.V.:2184.2; NG/GT:60; IV Piggyback:450] Out: 2111 [BZMCE:0223; Emesis/NG output:1000] Intake/Output this shift: Total I/O In: 1505 [P.O.:510; I.V.:581; IV Piggyback:414] Out: 750 [Urine:750] EXAM  Alert extrem warm abd w/o guarding  Lab Results:  Recent Labs  06/30/16 0415 07/02/16 0407  WBC 18.4* 13.7*  HGB 10.7* 11.1*  HCT 33.7* 34.5*  PLT 164 264   BMET:  Recent Labs  07/01/16 0845 07/02/16 0407  NA 142 142  K 3.0* 3.1*  CL 108 108  CO2 25 24  GLUCOSE 218* 188*  BUN 51* 51*  CREATININE 1.34* 1.29*  CALCIUM 8.3* 8.5*    PT/INR: No results for input(s): LABPROT, INR in the last 72 hours. ABG    Component Value Date/Time   PHART 7.430 06/28/2016 1615   HCO3 20.7 06/28/2016 1615   TCO2 22 06/28/2016 1615   ACIDBASEDEF 3.0 (H) 06/28/2016 1615   O2SAT 93.0 06/28/2016 1615   CBG (last 3)   Recent Labs  07/02/16 0357 07/02/16 0857 07/02/16 1255  GLUCAP 191* 203* 200*    Assessment/Plan: S/P  Mobilize Diuresis Diabetes control cont TPN   LOS: 12 days    [name] 07/02/2016

## 2016-07-03 LAB — CULTURE, BLOOD (ROUTINE X 2)
Culture: NO GROWTH
Culture: NO GROWTH
Special Requests: ADEQUATE
Special Requests: ADEQUATE

## 2016-07-03 LAB — BASIC METABOLIC PANEL
Anion gap: 7 (ref 5–15)
BUN: 48 mg/dL — ABNORMAL HIGH (ref 6–20)
CO2: 26 mmol/L (ref 22–32)
Calcium: 8.5 mg/dL — ABNORMAL LOW (ref 8.9–10.3)
Chloride: 107 mmol/L (ref 101–111)
Creatinine, Ser: 1.16 mg/dL — ABNORMAL HIGH (ref 0.44–1.00)
GFR calc Af Amer: 59 mL/min — ABNORMAL LOW (ref >60)
GFR calc non Af Amer: 50 mL/min — ABNORMAL LOW (ref >60)
Glucose, Bld: 152 mg/dL — ABNORMAL HIGH (ref 65–99)
Potassium: 3.2 mmol/L — ABNORMAL LOW (ref 3.5–5.1)
Sodium: 140 mmol/L (ref 135–145)

## 2016-07-03 LAB — GLUCOSE, CAPILLARY
Glucose-Capillary: 149 mg/dL — ABNORMAL HIGH (ref 65–99)
Glucose-Capillary: 151 mg/dL — ABNORMAL HIGH (ref 65–99)
Glucose-Capillary: 149 mg/dL — ABNORMAL HIGH (ref 65–99)
Glucose-Capillary: 121 mg/dL — ABNORMAL HIGH (ref 65–99)
Glucose-Capillary: 137 mg/dL — ABNORMAL HIGH (ref 65–99)

## 2016-07-03 LAB — C DIFFICILE QUICK SCREEN W PCR REFLEX
C Diff antigen: NEGATIVE
C Diff interpretation: NOT DETECTED
C Diff toxin: NEGATIVE

## 2016-07-03 MED ORDER — METOPROLOL TARTRATE 5 MG/5ML IV SOLN
5.0000 mg | Freq: Four times a day (QID) | INTRAVENOUS | Status: DC
  Administered 2016-07-03 – 2016-07-06 (×12): 5 mg via INTRAVENOUS
  Filled 2016-07-03 (×12): qty 5

## 2016-07-03 MED ORDER — FAT EMULSION 20 % IV EMUL
240.0000 mL | INTRAVENOUS | Status: AC
  Administered 2016-07-03: 240 mL via INTRAVENOUS
  Filled 2016-07-03: qty 250

## 2016-07-03 MED ORDER — POTASSIUM CHLORIDE 2 MEQ/ML IV SOLN
30.0000 meq | INTRAVENOUS | Status: AC
  Administered 2016-07-03 (×2): 30 meq via INTRAVENOUS
  Filled 2016-07-03 (×2): qty 15

## 2016-07-03 MED ORDER — POTASSIUM CHLORIDE 10 MEQ/50ML IV SOLN
10.0000 meq | INTRAVENOUS | Status: AC
  Administered 2016-07-03 (×4): 10 meq via INTRAVENOUS
  Filled 2016-07-03 (×4): qty 50

## 2016-07-03 MED ORDER — M.V.I. ADULT IV INJ
INTRAVENOUS | Status: AC
  Administered 2016-07-03: 17:00:00 via INTRAVENOUS
  Filled 2016-07-03: qty 1992

## 2016-07-03 NOTE — Progress Notes (Signed)
Cdiff negative. Precautions d/c

## 2016-07-03 NOTE — Progress Notes (Signed)
After night of intermittent vomiting small amounts, pt had acute large emesis over gown and bed. Same bile/yellow appearance. Dr. Barry Dienes was rounding on floor, so discussed situation with her and she gave orders to reinsert NG tube to continuous low suction.   Cause of emesis explained to patient multiple times. Patient verbalizes not understanding condition despite multiple explanations. Unclear if her repeated questions are d/t ICU delirium or other causes.  Will continue to monitor.

## 2016-07-03 NOTE — Progress Notes (Signed)
  Subjective: Did not tolerate liq diet w/ Nausea-vomiting NG to be replaced by gen surg Will stop po metoprolol and change to scheduled iv dosing Objective: Vital signs in last 24 hours: Temp:  [97.2 F (36.2 C)-98.7 F (37.1 C)] 98.4 F (36.9 C) (06/01 0728) Pulse Rate:  [66-90] 87 (06/01 0800) Cardiac Rhythm: Normal sinus rhythm (06/01 0800) Resp:  [15-27] 16 (06/01 0800) BP: (97-169)/(43-108) 132/52 (06/01 0745) SpO2:  [94 %-100 %] 100 % (06/01 0800) Weight:  [189 lb 2.5 oz (85.8 kg)] 189 lb 2.5 oz (85.8 kg) (06/01 0500)  Hemodynamic parameters for last 24 hours:  nsr  Intake/Output from previous day: 05/31 0701 - 06/01 0700 In: 3960.3 [P.O.:590; I.V.:2366.3; IV Piggyback:1004] Out: 3910 [Urine:3250; Emesis/NG output:60; Stool:600] Intake/Output this shift: Total I/O In: 183 [I.V.:83; IV Piggyback:100] Out: 50 [Urine:50]  OOB in chair Absent bowel sounds extrem warm Neuro intact  Lab Results:  Recent Labs  07/02/16 0407  WBC 13.7*  HGB 11.1*  HCT 34.5*  PLT 264   BMET:  Recent Labs  07/02/16 0407 07/03/16 0430  NA 142 140  K 3.1* 3.2*  CL 108 107  CO2 24 26  GLUCOSE 188* 152*  BUN 51* 48*  CREATININE 1.29* 1.16*  CALCIUM 8.5* 8.5*    PT/INR: No results for input(s): LABPROT, INR in the last 72 hours. ABG    Component Value Date/Time   PHART 7.430 06/28/2016 1615   HCO3 20.7 06/28/2016 1615   TCO2 22 06/28/2016 1615   ACIDBASEDEF 3.0 (H) 06/28/2016 1615   O2SAT 93.0 06/28/2016 1615   CBG (last 3)   Recent Labs  07/02/16 2340 07/03/16 0442 07/03/16 0726  GLUCAP 213* 149* 151*    Assessment/Plan: S/P  type B aortic dissection BP well controlled persistent ileus from prob bowel malperfusion due to type B dissection Check c.diff now she is having diarrhea Cont TPN  LOS: 13 days    [name] 07/03/2016

## 2016-07-03 NOTE — Progress Notes (Signed)
  PHARMACY - ADULT TOTAL PARENTERAL NUTRITION CONSULT NOTE   Pharmacy Consult for TPN Indication: prolonged NPO status/high NG output   Patient Measurements: Height: '5\' 3"'$  (160 cm) Weight: 189 lb 2.5 oz (85.8 kg) IBW/kg (Calculated) : 52.4 TPN AdjBW (KG): 60.7 Body mass index is 33.51 kg/m. 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 Patient had a significant hx of N/V for 2-4 weeks prior to admission. Weight appears to have been maintained. Patient had a NG tube placed 5/20 which had significant output with 1-1.5 L a day, removed NG tube on 5/30, however, patient with significant episodes of N&V and NG replaced 6/1 with continuous low suction and is now NPO.   GI: NG tube removed 5/30 - replaced 6/1. Significant episodes of emesis overnight.  Moderate pain to abdomen but hasn't changed significantly.  Prealbumin down to 9.0 and albumin down to 1.8.  Phenergan Q 6 prn, bisacodyl, simethicone.Last BM: 5/31. Tried clear liquids, but failed to tolerate and now placed back to NPO.  Abdominal x-ray on 5/31 shows abnormal dilated small intestine - SBO vs. Enteritis  Endo: Hx of DM. No insulin at home. CBGs better after Lantus increase (152 this am).  No hypoglycemic events. - Insulin requirements in the past 24 hours: 12 units of aspart; 80 units of Lantus  Lytes: Na-140, K up to 3.2 (4mq supplemented 5/31), CoCa-10.2, Mg and Phos wnl.   Renal:AKI: Scr 1.34>>1.29>1.16 (Baseline < 1). BUN stable 51. UOP good. - 1.5 L from admission Pulm: 2 L [place]; Dulera; Brovana, Albuterol  Cards: Stanford type B aortic dissection; BP soft-WNL, HR 70s-80s in NSR. Lopressor  50 mg bid , clonidine 0.3 mg patch , nitroglycerin 0.'4mg'$  patch, lasix 40 bid, hydralazine 10 mg Q 4.  Hepatobil:AST/ALT up to 70/83. Alk phos up to 151, Tbili up to 1.3. Triglycerides back down to 167 Neuro: Hydromorphone prn IGB:EEFEOFHQRon Meropenem (D#8) for  respiratory failure from PNA. Clindamycin and azithromycin stopped. WBC down to 13.7 (peaked at 36.7) Afebrile. No growth on cultures  Best Practices:PPI TPN Access:PICC line placed 5/20 TPN start date:5/22  Nutritional Goals (per RD recommendation on 5/25): KCal: 2000-2200 kcal/day  Protein: 110-120 gm/day   Goal  TPN rate: Clinimix 5/15 E @ 83 ml/hr + lipid emulsion 20 ml/hr   Current Nutrition:   TPN Clear liquid diet   Plan:  Continue Clinimix E 5/15 at 83 ml/hr Continue lipid emulsion 20% at 20 ml/hr  This provides 100 g of protein and 1894 kCals per day meeting 91% of protein and 95% of kCal needs Continue MVI in TPN Continue TE in TPN every other day, next 6/1 Continue Lantus to 40 units BID Continue resistant SSI and adjust as needed F/U ability to transition to TFs/advance diet  Monitor BMET    NRober Minion PharmD., MS Clinical Pharmacist Pager:  [phone] you for allowing pharmacy to be part of this patients care team. 07/03/2016 7:57 AM

## 2016-07-03 NOTE — Progress Notes (Addendum)
  [facility] Progress Note  Subjective    Feels bad. Did not tolerate clears yesterday. With n/v this am. Had three liquid BMs yesterday. Having some chest discomfort.   Objective Vitals:   07/03/16 0750 07/03/16 0800  BP:    Pulse: 80 87  Resp: 18 16  Temp:      Intake/Output Summary (Last 24 hours) at 07/03/16 0853 Last data filed at 07/03/16 0800  Gross per 24 hour  Intake          3847.26 ml  Output             3760 ml  Net            87.26 ml   Abdomen distended, tender. No BS heard.  Assessment/Planning: 61 y.o. female with type B thoracic aortic dissection, concern for ischemic bowel   N/V: NG tube to be replaced today. Continue TPN.  WBC down yesterday. Afebrile. Continue abx. Having liquid stools. C. diff pending.   Alvia Grove 07/03/2016 8:53 AM --  Laboratory CBC    Component Value Date/Time   WBC 13.7 (H) 07/02/2016 0407   HGB 11.1 (L) 07/02/2016 0407   HCT 34.5 (L) 07/02/2016 0407   PLT 264 07/02/2016 0407    BMET    Component Value Date/Time   NA 140 07/03/2016 0430   K 3.2 (L) 07/03/2016 0430   CL 107 07/03/2016 0430   CO2 26 07/03/2016 0430   GLUCOSE 152 (H) 07/03/2016 0430   BUN 48 (H) 07/03/2016 0430   CREATININE 1.16 (H) 07/03/2016 0430   CALCIUM 8.5 (L) 07/03/2016 0430   GFRNONAA 50 (L) 07/03/2016 0430   GFRAA 59 (L) 07/03/2016 0430    COAG No results found for: INR, PROTIME No results found for: PTT  Antibiotics Anti-infectives    Start     Dose/Rate Route Frequency Ordered Stop   06/26/16 0830  meropenem (MERREM) 1 g in sodium chloride 0.9 % 100 mL IVPB     1 g 200 mL/hr over 30 Minutes Intravenous Every 8 hours 06/26/16 0810     06/22/16 1800  clindamycin (CLEOCIN) IVPB 600 mg  Status:  Discontinued     600 mg 100 mL/hr over 30 Minutes Intravenous Every 8 hours 06/22/16 1723 06/26/16 0810   06/22/16 1000  azithromycin (ZITHROMAX) 250 mg in dextrose 5 % 125 mL IVPB  Status:  Discontinued     250  mg 125 mL/hr over 60 Minutes Intravenous Every 24 hours 06/22/16 0826 06/27/16 1019   06/22/16 0900  levofloxacin (LEVAQUIN) IVPB 500 mg  Status:  Discontinued     500 mg 100 mL/hr over 60 Minutes Intravenous Every 24 hours 06/22/16 0759 06/22/16 Landa, PA-C [facility] Office: [phone] Pager: [phone] 07/03/2016 8:53 AM

## 2016-07-03 NOTE — Progress Notes (Addendum)
Pt has been vomiting small amounts of yellow/bile emesis intermittently throughout the night. Total emesis output for shift estimated 100-200cc. Restricting pt's oral intake made no difference. IV antiemetics made no difference.  Pt has c/o ongoing mild-mod abdominal pain, but it has not increased in severity during the night. No apparent changes in bowel sounds or abdominal distention.  Will continue to monitor. Seems likely pt will need new NG placed at some point if vomiting continues. Will inform team during AM rounds.   Henreitta Leber, RN 07/03/16

## 2016-07-03 NOTE — Progress Notes (Addendum)
RN attempted to place NG tube, pt did not tolerate with desating and tachypnea. RN will attempt again later when pt given pain medication.   Dilaudid given, NG tube placed at this time. Pt tolerated well.

## 2016-07-03 NOTE — Progress Notes (Signed)
CC: abdominal pain  Subjective: Having nausea and vomiting all the clears she takes in. Liquid stools also.  She has a hard time understanding issues.  NG has been reordered.    Objective: Vital signs in last 24 hours: Temp:  [97.2 F (36.2 C)-98.7 F (37.1 C)] 98.4 F (36.9 C) (06/01 0728) Pulse Rate:  [66-90] 80 (06/01 0750) Resp:  [15-27] 18 (06/01 0750) BP: (97-169)/(43-108) 132/52 (06/01 0745) SpO2:  [93 %-100 %] 100 % (06/01 0750) Weight:  [85.8 kg (189 lb 2.5 oz)] 85.8 kg (189 lb 2.5 oz) (06/01 0500) Last BM Date: 07/02/16 590 PO 2500 IV Urine 3250 Emesis 60 Stool 600 recorded Afebrile, VSS K+ 3.2 No CBC today 1 view abd - 5/31: Nasogastric tube is been removed. There is a group of dilated fluid and air-filled loops of small intestine in the left abdomen with apparent wall thickening. This could be due to partial obstruction or enteritis, including ischemic bowel insult.  Intake/Output from previous day: 05/31 0701 - 06/01 0700 In: 3960.3 [P.O.:590; I.V.:2366.3; IV Piggyback:1004] Out: 3910 [Urine:3250; Emesis/NG output:60; Stool:600] Intake/Output this shift: Total I/O In: 100 [IV Piggyback:100] Out: 50 [Urine:50]  General appearance: alert, cooperative and no distress GI: she is uncomfortable, vomiting up whatever she takes in.  She is having loose watery stools also.  few high pitched BS..  she is up in the chair but she feels distended and tight.  Lab Results:   Recent Labs  07/02/16 0407  WBC 13.7*  HGB 11.1*  HCT 34.5*  PLT 264    BMET  Recent Labs  07/02/16 0407 07/03/16 0430  NA 142 140  K 3.1* 3.2*  CL 108 107  CO2 24 26  GLUCOSE 188* 152*  BUN 51* 48*  CREATININE 1.29* 1.16*  CALCIUM 8.5* 8.5*   PT/INR No results for input(s): LABPROT, INR in the last 72 hours.   Recent Labs Lab 06/26/16 1715 06/27/16 0340 06/28/16 0347 06/29/16 0336 07/02/16 0407  AST 58* 55* 56* 52* 70*  ALT 134* 125* 95* 73* 83*  ALKPHOS 100  128* 146* 133* 151*  BILITOT 0.9 0.8 0.9 1.0 1.3*  PROT 5.2* 5.6* 5.6* 5.8* 6.5  ALBUMIN 2.1* 2.2* 1.9* 1.9* 1.8*     Lipase     Component Value Date/Time   LIPASE 38 06/20/2016 0815     Medications: . arformoterol  15 mcg Nebulization BID  . bisacodyl  10 mg Rectal q morning - 10a  . budesonide (PULMICORT) nebulizer solution  0.5 mg Nebulization BID  . chlorhexidine  15 mL Mouth Rinse BID  . Chlorhexidine Gluconate Cloth  6 each Topical Daily  . clonazePAM  1 mg Oral QHS  . cloNIDine  0.3 mg Transdermal Weekly  . furosemide  40 mg Intravenous Q12H  . hydrALAZINE  10 mg Intravenous Q4H  . insulin aspart  0-24 Units Subcutaneous Q4H  . insulin glargine  40 Units Subcutaneous BID  . labetalol  5 mg Intravenous Once  . mouth rinse  15 mL Mouth Rinse q12n4p  . metoprolol tartrate  50 mg Oral BID  . nitroGLYCERIN  0.4 mg Transdermal Daily  . pantoprazole (PROTONIX) IV  40 mg Intravenous Q24H  . 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 Gastroparesis  Left Renal infarct Tobacco use GERD IBS Hypertension Type II diabetes FEN: TNA/NPO/NG - NG pulled - 590 ice ID: Azithromycin/Clindamycin 5/21 =>>day 5 Converted to Meropenem 5/25 =>> day 8 DVT: SCD's   I am stopping dulcolax suppository, her last one was 07/01/16.  NG order in place.     LOS: 13 days    Aztlan Coll 07/03/2016 [phone]

## 2016-07-03 NOTE — Progress Notes (Signed)
Physical Therapy Treatment Patient Details Name: Haley Graham MRN: [id number] DOB: 1955-05-07 Today's Date: 07/03/2016    History of Present Illness Pt adm with type B aortic dissection. Treated non surgically with BP control and pain control. Pt developed pancreatitis and ileus with NG tube placed. PMH - anxiety, arthritis    PT Comments    Pt making better progress. Pt refusing SNF. If family can provide 24 hour assist then could do HHPT.   Follow Up Recommendations  Home health PT;Supervision/Assistance - 24 hour     Equipment Recommendations  Rolling walker with 5" wheels    Recommendations for Other Services       Precautions / Restrictions Precautions Precautions: Fall Precaution Comments: NG to suction Restrictions Weight Bearing Restrictions: No    Mobility  Bed Mobility Overal bed mobility: Needs Assistance Bed Mobility: Supine to Sit     Supine to sit: Min assist     General bed mobility comments: Assist to elevate trunk into sitting and scoot to EOB  Transfers Overall transfer level: Needs assistance Equipment used: Rolling walker (2 wheeled) Transfers: Sit to/from Stand Sit to Stand: Min assist         General transfer comment: Verbal cues for hand placement. Assist to bring hips up.  Ambulation/Gait Ambulation/Gait assistance: Min assist;+2 physical assistance (chair follow) Ambulation Distance (Feet): 100 Feet (x 2) Assistive device: Rolling walker (2 wheeled) Gait Pattern/deviations: Step-through pattern;Decreased step length - right;Decreased step length - left Gait velocity: decr Gait velocity interpretation: Below normal speed for age/gender General Gait Details: Assist for balance and support. Assist to guide walker. Brief sitting rest break between bouts. Amb on 2L of O2 with SpO2 >97%.    Stairs            Wheelchair Mobility    Modified Rankin (Stroke Patients Only)       Balance Overall balance assessment: Needs  assistance Sitting-balance support: No upper extremity supported;Feet supported Sitting balance-Leahy Scale: Fair     Standing balance support: Bilateral upper extremity supported Standing balance-Leahy Scale: Poor Standing balance comment: walker and min assist for static standing                            Cognition Arousal/Alertness: Awake/alert Behavior During Therapy: Flat affect Overall Cognitive Status: Impaired/Different from baseline Area of Impairment: Following commands;Attention;Problem solving                   Current Attention Level: Sustained   Following Commands: Follows one step commands with increased time     Problem Solving: Slow processing;Decreased initiation;Requires verbal cues;Requires tactile cues        Exercises      General Comments        Pertinent Vitals/Pain Pain Assessment: Faces Faces Pain Scale: Hurts little more Pain Location: abdomen (NG clamped this session) Pain Descriptors / Indicators: Discomfort;Grimacing Pain Intervention(s): Limited activity within patient's tolerance;Monitored during session    Home Living                      Prior Function            PT Goals (current goals can now be found in the care plan section) Progress towards PT goals: Progressing toward goals    Frequency    Min 3X/week      PT Plan Discharge plan needs to be updated    Co-evaluation  AM-PAC PT "6 Clicks" Daily Activity  Outcome Measure  Difficulty turning over in bed (including adjusting bedclothes, sheets and blankets)?: A Little Difficulty moving from lying on back to sitting on the side of the bed? : Total Difficulty sitting down on and standing up from a chair with arms (e.g., wheelchair, bedside commode, etc,.)?: Total Help needed moving to and from a bed to chair (including a wheelchair)?: A Little Help needed walking in hospital room?: A Little Help needed climbing 3-5 steps  with a railing? : A Lot 6 Click Score: 13    End of Session Equipment Utilized During Treatment: Oxygen Activity Tolerance: Patient tolerated treatment well Patient left: in chair;with call bell/phone within reach;with family/visitor present   PT Visit Diagnosis: Muscle weakness (generalized) (M62.81);Other abnormalities of gait and mobility (R26.89)     Time: 1833-5825 PT Time Calculation (min) (ACUTE ONLY): 25 min  Charges:  $Gait Training: 23-37 mins                    G Codes:       [facility] PT [place] 07/03/2016, 2:52 PM

## 2016-07-03 NOTE — Progress Notes (Signed)
Subjective: Interval History: none.. Sitting up in chair. Reports that she has done a fair amount of walking today. Asking for liquids  Objective: Vital signs in last 24 hours: Temp:  [97.1 F (36.2 C)-98.7 F (37.1 C)] 97.1 F (36.2 C) (06/01 1510) Pulse Rate:  [66-95] 83 (06/01 1530) Resp:  [12-25] 16 (06/01 1530) BP: (97-145)/(50-108) 145/67 (06/01 1613) SpO2:  [94 %-100 %] 100 % (06/01 1530) Weight:  [189 lb 2.5 oz (85.8 kg)] 189 lb 2.5 oz (85.8 kg) (06/01 0500)  Intake/Output from previous day: 05/31 0701 - 06/01 0700 In: 3960.3 [P.O.:590; I.V.:2366.3; IV Piggyback:1004] Out: 3910 [Urine:3250; Emesis/NG output:60; Stool:600] Intake/Output this shift: Total I/O In: 1494 [I.V.:664; IV Piggyback:830] Out: 2360 [Urine:1410; Emesis/NG output:700; Stool:250]  Abdomen soft with mild diffuse tenderness  Lab Results:  Recent Labs  07/02/16 0407  WBC 13.7*  HGB 11.1*  HCT 34.5*  PLT 264   BMET  Recent Labs  07/02/16 0407 07/03/16 0430  NA 142 140  K 3.1* 3.2*  CL 108 107  CO2 24 26  GLUCOSE 188* 152*  BUN 51* 48*  CREATININE 1.29* 1.16*  CALCIUM 8.5* 8.5*    Studies/Results: Ct Abdomen Pelvis Wo Contrast  Result Date: 06/26/2016 CLINICAL DATA:  Shortness of breath EXAM: CT CHEST, ABDOMEN AND PELVIS WITHOUT CONTRAST TECHNIQUE: Multidetector CT imaging of the chest, abdomen and pelvis was performed following the standard protocol without IV contrast. COMPARISON:  06/20/2016 chest CT FINDINGS: CT CHEST FINDINGS Cardiovascular: Normal heart size. No pericardial effusion. 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 Mild haziness around the upper descending segment is likely stable. Stable maximal diameter of 33 mm at this level. No intramural hematoma. Intermittently seen displaced intimal calcification. Mediastinum/Nodes: Negative for adenopathy. Right upper extremity PICC with tip at the SVC level. Lungs/Pleura: Multi segment atelectasis, worse on  the left. Small pleural effusions. There is patchy ground-glass airspace density in the bilateral lungs without gradient. Airways are clear and there is no septal thickening. Musculoskeletal: No acute or aggressive finding. CT ABDOMEN PELVIS FINDINGS Hepatobiliary: Hepatic steatosis.Cholecystectomy with normal common bile duct diameter. Pancreas: Unremarkable. Spleen: Unremarkable. 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 The larger measures up to 27 mm and is indeterminate by densitometry, but left adrenal mass has been noted since at least 2006 abdominal CT report, images not available. Left renal cyst. No hydronephrosis or urolithiasis. 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 Negative decompressed urinary bladder. Stomach/Bowel: There is diffuse small bowel distention and fluid filling with mildly hazy mesenteries. Similar features in the colon proximally and at the transverse segment. No pneumatosis or perforation is noted. Nasogastric tube tip is at the pylorus. Vascular/Lymphatic: There is intimal flap displacement from known dissection, morphology appearing similar to prior. No mass or adenopathy. Reproductive:Hysterectomy.  Unremarkable ovaries. Other: No ascites or pneumoperitoneum.  Anasarca. Musculoskeletal: No acute abnormalities. These results were called by telephone at the time of interpretation on 06/26/2016 at 2:05 pm to Dr. Ivin Poot , who verbally acknowledged these results. IMPRESSION: 1. Diffuse airspace disease. Pattern can be seen with noncardiogenic edema (ARDS), inflammatory pneumonitis, or atypical infection. Multi segment atelectasis at the bases and small pleural effusions. 2. Diffuse small bowel and colonic distention with fluid levels as seen with ileus. Question underlying bowel ischemia given the patient's known aortic dissection with proximal IMA occlusion and SMA flow via the  narrow true lumen. 3. Incidental  findings noted above. Electronically Signed   By: Monte Fantasia M.D.   On: 06/26/2016 14:05   Ct Chest Wo Contrast  Result Date: 06/26/2016 CLINICAL DATA:  Shortness of breath EXAM: CT CHEST, ABDOMEN AND PELVIS WITHOUT CONTRAST TECHNIQUE: Multidetector CT imaging of the chest, abdomen and pelvis was performed following the standard protocol without IV contrast. COMPARISON:  06/20/2016 chest CT FINDINGS: CT CHEST FINDINGS Cardiovascular: Normal heart size. No pericardial effusion. 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 Mild haziness around the upper descending segment is likely stable. Stable maximal diameter of 33 mm at this level. No intramural hematoma. Intermittently seen displaced intimal calcification. Mediastinum/Nodes: Negative for adenopathy. Right upper extremity PICC with tip at the SVC level. Lungs/Pleura: Multi segment atelectasis, worse on the left. Small pleural effusions. There is patchy ground-glass airspace density in the bilateral lungs without gradient. Airways are clear and there is no septal thickening. Musculoskeletal: No acute or aggressive finding. CT ABDOMEN PELVIS FINDINGS Hepatobiliary: Hepatic steatosis.Cholecystectomy with normal common bile duct diameter. Pancreas: Unremarkable. Spleen: Unremarkable. 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 The larger measures up to 27 mm and is indeterminate by densitometry, but left adrenal mass has been noted since at least 2006 abdominal CT report, images not available. Left renal cyst. No hydronephrosis or urolithiasis. 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 Negative decompressed urinary bladder. Stomach/Bowel: There is diffuse small bowel distention and fluid filling with mildly hazy mesenteries. Similar features in the colon proximally and at the transverse segment. No pneumatosis  or perforation is noted. Nasogastric tube tip is at the pylorus. Vascular/Lymphatic: There is intimal flap displacement from known dissection, morphology appearing similar to prior. No mass or adenopathy. Reproductive:Hysterectomy.  Unremarkable ovaries. Other: No ascites or pneumoperitoneum.  Anasarca. Musculoskeletal: No acute abnormalities. These results were called by telephone at the time of interpretation on 06/26/2016 at 2:05 pm to Dr. Ivin Poot , who verbally acknowledged these results. IMPRESSION: 1. Diffuse airspace disease. Pattern can be seen with noncardiogenic edema (ARDS), inflammatory pneumonitis, or atypical infection. Multi segment atelectasis at the bases and small pleural effusions. 2. Diffuse small bowel and colonic distention with fluid levels as seen with ileus. Question underlying bowel ischemia given the patient's known aortic dissection with proximal IMA occlusion and SMA flow via the narrow true lumen. 3. Incidental findings noted above. Electronically Signed   By: Monte Fantasia M.D.   On: 06/26/2016 14:05   Dg Chest Port 1 View  Result Date: 07/01/2016 CLINICAL DATA:  Shortness of breath common pneumonia, thoracic aortic dissection EXAM: PORTABLE CHEST 1 VIEW COMPARISON:  Portable chest x-ray of Jun 30, 2016 FINDINGS: The lungs are adequately inflated. The pulmonary interstitial markings are less prominent. The retrocardiac region remains dense and there is remains partial obscuration of the left hemidiaphragm. The heart is top-normal in size. The mediastinum is normal in width. The esophagogastric tube tip projects below the inferior margin of the image. The right PICC line tip projects over the midportion of the SVC. IMPRESSION: Slight interval improvement in the appearance of the pulmonary interstitium suggests decreasing interstitial edema. There is persistent left lower lobe atelectasis or pneumonia. Electronically Signed   By: David  Martinique M.D.   On: 07/01/2016 07:35    Dg Chest Port 1 View  Result Date: 06/30/2016 CLINICAL DATA:  Breath, pneumonia, aortic aneurysm, GERD, hypertension, diabetes mellitus EXAM: PORTABLE CHEST 1 VIEW COMPARISON:  Portable exam 0633 hours compared to 06/29/2016 FINDINGS: Nasogastric tube coiled in proximal stomach. RIGHT arm PICC line tip projects over SVC above cavoatrial junction. Upper normal heart size. Mediastinal contours normal. Mild pulmonary vascular congestion. Persistent diffuse BILATERAL infiltrates. Atelectasis versus consolidation LEFT lower lobe. No pneumothorax. IMPRESSION: Persistent mild diffuse interstitial infiltrates with atelectasis versus consolidation in LEFT lower lobe. Electronically Signed   By: Lavonia Dana M.D.   On: 06/30/2016 06:58   Dg Chest Port 1 View  Result Date: 06/29/2016 CLINICAL DATA:  Shortness of Breath EXAM: PORTABLE CHEST 1 VIEW COMPARISON:  06/28/2016 FINDINGS: Nasogastric catheter is noted coiled within the stomach. Right-sided PICC line is seen in the distal superior vena cava. Cardiac shadow is mildly enlarged but stable. Bilateral vascular congestion and edema is seen stable from prior exam. Some atelectasis remains in the left retrocardiac region. IMPRESSION: No significant interval change from the prior exam. Electronically Signed   By: Inez Catalina M.D.   On: 06/29/2016 07:36   Dg Chest Port 1 View  Result Date: 06/28/2016 CLINICAL DATA:  Aortic dissection. EXAM: PORTABLE CHEST 1 VIEW COMPARISON:  06/27/2016. FINDINGS: The heart is enlarged. There is moderate vascular congestion. Prominence of the aortic contour reflecting the type B dissection. LEFT lower lobe atelectasis with effusion. IMPRESSION: Cardiomegaly. Aortic dissection. Vascular congestion. LEFT lower lobe atelectasis with effusion. Electronically Signed   By: Staci Righter M.D.   On: 06/28/2016 07:13   Dg Chest Port 1 View  Result Date: 06/27/2016 CLINICAL DATA:  ARDS. EXAM: PORTABLE CHEST 1 VIEW COMPARISON:  Jun 26, 2016  FINDINGS: The right PICC line is stable. The NG tube terminates below today's study. Diffuse bilateral primarily interstitial opacities are mildly more homogeneous in less patchy in appearance. More focal opacity in the left base is stable. No other changes. IMPRESSION: 1. Diffuse bilateral primarily interstitial opacities suggesting edema remain. More focal opacity in the left base is stable to mildly improved. Electronically Signed   By: Dorise Bullion III M.D   On: 06/27/2016 07:21   Dg Chest Port 1 View  Result Date: 06/26/2016 CLINICAL DATA:  Shortness of breath. EXAM: PORTABLE CHEST 1 VIEW COMPARISON:  Radiograph of Jun 25, 2016. FINDINGS: Stable cardiomegaly. Increased bilateral diffuse interstitial and basilar opacities are noted concerning for worsening edema. No pneumothorax is noted. Mild left pleural effusion is noted with associated atelectasis or infiltrate. Right-sided PICC line is unchanged in position. Nasogastric tube is unchanged in position. Bony thorax is unremarkable. IMPRESSION: Increased bilateral diffuse interstitial densities consistent with edema. Mild left pleural effusion is noted with associated atelectasis or infiltrate. Electronically Signed   By: Marijo Conception, M.D.   On: 06/26/2016 07:40   Dg Chest Port 1 View  Result Date: 06/25/2016 CLINICAL DATA:  Shortness of Breath EXAM: PORTABLE CHEST 1 VIEW COMPARISON:  06/24/2016 FINDINGS: Cardiac shadow is prominent but stable. Right-sided PICC line and nasogastric catheter are again seen and stable. Bibasilar atelectatic changes are noted worse on the left than the right and increased from the prior exam. Small left pleural effusion is noted as well. Mild vascular congestion is seen. IMPRESSION: Increasing bibasilar infiltrates left greater than right. Mild vascular congestion is noted. The Electronically Signed   By: Inez Catalina M.D.   On: 06/25/2016 07:54   Dg Chest Port 1 View  Result Date: 06/24/2016 CLINICAL DATA:   Shortness of Breath EXAM: PORTABLE CHEST 1 VIEW COMPARISON:  06/23/2016 FINDINGS: Right-sided PICC line is again noted at the cavoatrial junction.  Nasogastric catheter is noted in satisfactory position through the overall inspiratory effort is decreased from the prior exam. Mild left basilar atelectasis remains. No other focal abnormality is noted. IMPRESSION: Stable left basilar atelectasis. Electronically Signed   By: Inez Catalina M.D.   On: 06/24/2016 08:07   Dg Chest Port 1 View  Result Date: 06/23/2016 CLINICAL DATA:  61 year old female with shortness of breath and abdominal pain. 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 Being treated conservatively at this time, including with bowel rest. EXAM: PORTABLE CHEST 1 VIEW COMPARISON:  Chest radiographs 06/22/2016 and earlier. CTA 06/20/2016 FINDINGS: Portable AP semi upright view at 0754 hours. Stable enteric tube which courses to the abdomen. Stable right PICC line. Mildly larger lung volumes and decreased streaky bibasilar opacity. Mild residual. No pneumothorax or pulmonary edema. No pleural effusion or consolidation. Stable cardiac size and mediastinal contours. IMPRESSION: 1.  Stable lines and tubes. 2. Mildly improved lung volumes and regressed bibasilar opacity favored to be atelectasis. Electronically Signed   By: Genevie Ann M.D.   On: 06/23/2016 08:19   Dg Chest Port 1 View  Result Date: 06/22/2016 CLINICAL DATA:  61 year old female with history of abdominal aortic aneurysm. Shortness of breath and vomiting. EXAM: PORTABLE CHEST 1 VIEW COMPARISON:  Chest x-ray 06/21/2016. FINDINGS: There is a right upper extremity PICC with tip terminating in the superior cavoatrial junction. A nasogastric tube is seen extending into the stomach, however, the tip of the nasogastric tube extends below the lower margin of the image. Lung volumes are low.  Linear bibasilar opacities favored to reflect areas of subsegmental atelectasis, however, underlying airspace consolidation from infection or aspiration is not excluded. No pleural effusions. No evidence of pulmonary edema. Heart size is normal. Upper mediastinal contours are within normal limits. IMPRESSION: 1. Support apparatus, as above. 2. Low lung volumes with bibasilar areas of atelectasis and/or airspace consolidation. Electronically Signed   By: Vinnie Langton M.D.   On: 06/22/2016 07:30   Dg Chest Port 1 View  Result Date: 06/21/2016 CLINICAL DATA:  Aortic dissection EXAM: PORTABLE CHEST 1 VIEW COMPARISON:  06/20/2016 FINDINGS: Bibasilar atelectasis. Heart is normal size. No effusions or acute bony abnormality. IMPRESSION: Bibasilar atelectasis. Electronically Signed   By: Rolm Baptise M.D.   On: 06/21/2016 07:18   Dg Chest Port 1 View  Result Date: 06/20/2016 CLINICAL DATA:  Hypoxia, unresponsive EXAM: PORTABLE CHEST 1 VIEW COMPARISON:  CTA chest dated 06/20/2016 FINDINGS: Lungs are essentially clear. No focal consolidation. No pleural effusion or pneumothorax. The heart is top-normal in size. IMPRESSION: No evidence of acute cardiopulmonary disease. Electronically Signed   By: Julian Hy M.D.   On: 06/20/2016 17:01   Dg Chest Port 1 View  Result Date: 06/20/2016 CLINICAL DATA:  Sudden onset sharp substernal chest pain that radiates to the back. Nausea, dizziness and shortness of breath. EXAM: PORTABLE CHEST 1 VIEW COMPARISON:  None. FINDINGS: Trachea is midline. Heart size is accentuated by AP supine technique. Probable mild scarring in the right middle lobe and lingula. Lungs are otherwise clear. No pleural fluid. IMPRESSION: No acute findings. Electronically Signed   By: Lorin Picket M.D.   On: 06/20/2016 09:56   Dg Abd Portable 1v  Result Date: 07/02/2016 CLINICAL DATA:  Nasogastric tube removed.  Acute abdominal pain. EXAM: PORTABLE ABDOMEN - 1 VIEW COMPARISON:  06/27/2016  FINDINGS: Nasogastric tube is been removed. There is a group  of dilated fluid and air-filled loops of small intestine in the left abdomen with apparent wall thickening. This could be due to partial obstruction or enteritis, including ischemic bowel insult. IMPRESSION: Nasogastric tube removed. Abnormal dilated small intestine in the left central abdomen. Differential diagnosis partial small bowel obstruction versus enteritis, including ischemic. Electronically Signed   By: Nelson Chimes M.D.   On: 07/02/2016 07:37   Dg Abd Portable 1v  Result Date: 06/27/2016 CLINICAL DATA:  Ileus. EXAM: PORTABLE ABDOMEN - 1 VIEW COMPARISON:  Jun 26, 2016 FINDINGS: The NG tube terminates within the stomach. The bowel gas pattern is unremarkable on today's study. No other acute abnormalities. IMPRESSION: The NG tube terminates within the stomach. The bowel gas pattern is unremarkable. Electronically Signed   By: Dorise Bullion III M.D   On: 06/27/2016 07:19   Dg Abd Portable 1v  Result Date: 06/26/2016 CLINICAL DATA:  Shortness of breath, abdominal pain EXAM: PORTABLE ABDOMEN - 1 VIEW COMPARISON:  06/25/2016 FINDINGS: NG tube tip is in the distal stomach. Prior cholecystectomy. Nonobstructive bowel gas pattern. No free air organomegaly. IMPRESSION: NG tube tip in the distal stomach.  No acute findings. Electronically Signed   By: Rolm Baptise M.D.   On: 06/26/2016 07:36   Dg Abd Portable 1v  Result Date: 06/25/2016 CLINICAL DATA:  Shortness of breath.  Abdominal pain. EXAM: PORTABLE ABDOMEN - 1 VIEW COMPARISON:  Jun 24, 2016 FINDINGS: The distal tip of the NG tube is near the gastric antrum. Mild opacity in left lung base will be better assessed on today's chest x-ray. No free air or portal venous gas identified although evaluation is limited on supine imaging. No bowel obstruction. IMPRESSION: No interval change or acute abnormality in the abdomen. Electronically Signed   By: Dorise Bullion III M.D   On: 06/25/2016  07:54   Dg Abd Portable 1v  Result Date: 06/24/2016 CLINICAL DATA:  Abdominal distention. EXAM: PORTABLE ABDOMEN - 1 VIEW COMPARISON:  06/23/2016 . FINDINGS: NG tube noted in stable position with tip in the stomach . Surgical clips right upper quadrant. Several nonspecific loops of air-filled small bowel again noted. Colonic gas pattern is normal. No free air. IMPRESSION: 1.  NG tube in stable position. 2. Several nonspecific loops of air-filled small bowel again noted. No evidence of progressive bowel distention. Colonic gas pattern normal. No free air. Electronically Signed   By: Marcello Moores  Register   On: 06/24/2016 08:06   Dg Abd Portable 1v  Result Date: 06/23/2016 CLINICAL DATA:  61 year old female with shortness of breath and abdominal pain. 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 Being treated conservatively at this time, including with bowel rest. EXAM: PORTABLE ABDOMEN - 1 VIEW COMPARISON:  Abdominal radiographs 06/22/2016. CTA 06/20/2016, and earlier. FINDINGS: Portable AP supine view at 0759 hours. Stable NG tube, side hole to level of the gastric body and tip at the level of the gastric antrum. Stable cholecystectomy clips. Stable bowel gas pattern with several gas containing nondilated small and large bowel loops. No definite pneumoperitoneum on this supine view. Abdominal and pelvic visceral contours appear stable. No acute osseous abnormality identified. IMPRESSION: 1. Stable NG tube position. 2. Stable, nonobstructed bowel-gas pattern. Electronically Signed   By: Genevie Ann M.D.   On: 06/23/2016 08:18   Dg Abd Portable 1v  Result Date: 06/22/2016 CLINICAL DATA:  Abdominal pain for few days with nausea and vomiting. EXAM:  PORTABLE ABDOMEN - 1 VIEW COMPARISON:  06/22/2016 abdominal radiograph. FINDINGS: Enteric tube loops in the gastric fundus and terminates in the body of the  stomach, without appreciable kink. Top-normal caliber small bowel loops throughout the abdomen. Minimal colonic stool. No evidence of pneumatosis or pneumoperitoneum. No radiopaque urolithiasis. Cholecystectomy clips are seen in the right upper quadrant of the abdomen. Patchy opacities at the lung bases. IMPRESSION: 1. Enteric tube loops in the gastric fundus and terminates in the body of the stomach without appreciable kink. 2. Top-normal caliber small bowel loops, unchanged. 3. Patchy bibasilar lung opacities, correlate with chest radiograph. Electronically Signed   By: Ilona Sorrel M.D.   On: 06/22/2016 16:58   Dg Abd Portable 1v  Result Date: 06/22/2016 CLINICAL DATA:  Vomiting.  Shortness of breath.  AAA . EXAM: PORTABLE ABDOMEN - 1 VIEW COMPARISON:  06/21/2016. FINDINGS: Surgical clips right upper quadrant. NG tube noted with tip over the distal stomach. No bowel distention. No acute bony abnormality identified. Mild basilar atelectasis. IMPRESSION: NG tube noted with its tip over the distal stomach. No bowel distention or acute intra-abdominal abnormality identified. Electronically Signed   By: Marcello Moores  Register   On: 06/22/2016 07:30   Dg Abd Portable 1v  Result Date: 06/21/2016 CLINICAL DATA:  Feeding tube placement EXAM: PORTABLE ABDOMEN - 1 VIEW COMPARISON:  06/21/2016 FINDINGS: NG tube coils in the fundus of the stomach with the tip in the distal stomach. Decompression of the stomach. IMPRESSION: NG tube tip in the distal stomach. Electronically Signed   By: Rolm Baptise M.D.   On: 06/21/2016 15:46   Dg Abd Portable 1v  Result Date: 06/21/2016 CLINICAL DATA:  Ileus  Vomiting that started this AM per patient EXAM: PORTABLE ABDOMEN - 1 VIEW COMPARISON:  06/20/2016 FINDINGS: Gaseous distention of the stomach. Paucity of small bowel and colonic gas. Cholecystectomy clips. Linear scarring/ atelectasis in the lung bases. IMPRESSION: 1. Gaseous distention of the stomach. Electronically Signed   By: Lucrezia Europe M.D.   On: 06/21/2016 11:54   Dg Abd Portable 1v  Result Date: 06/20/2016 CLINICAL DATA:  Aortic dissection EXAM: PORTABLE ABDOMEN - 1 VIEW COMPARISON:  CT abdomen/ pelvis dated 06/20/2016 FINDINGS: Nonobstructive bowel gas pattern. Visualized osseous structures are within normal limits. IMPRESSION: Unremarkable abdominal radiograph. Electronically Signed   By: Julian Hy M.D.   On: 06/20/2016 17:02   Ct Angio Chest/abd/pel For Dissection W And/or W/wo  Result Date: 06/27/2016 CLINICAL DATA:  61 year old female with a history of type B dissection EXAM: CT ANGIOGRAPHY CHEST, ABDOMEN AND PELVIS TECHNIQUE: Multidetector CT imaging through the chest, abdomen and pelvis was performed using the standard protocol during bolus administration of intravenous contrast. Multiplanar reconstructed images and MIPs were obtained and reviewed to evaluate the vascular anatomy. CONTRAST:  100 cc Isovue 370 COMPARISON:  CT 06/20/2016 FINDINGS: CTA CHEST FINDINGS Cardiovascular: Heart: Heart size unchanged. No pericardial fluid/ thickening. No significant coronary calcifications. Aorta: Re- demonstration of type B dissection with the entry tear appearing to originate just beyond the origin of the left subclavian artery. Branch vessels remain patent without extension of the dissection flap into the branch vessels. Caliber and contour of the ascending aorta unremarkable without evidence of retrograde extension. Greatest diameter of the ascending aorta 2.9 cm. Inflammatory changes surrounding the distal aortic arch in the proximal descending aorta. Greatest diameter of the distal aortic arch measures approximately 3.4 cm. Greatest diameter on the comparison CT approximately 3.0 cm. No aneurysm of the descending thoracic aorta  with the greatest diameter of the false lumen measuring 2.7 cm. True lumen is compressed. There is a fenestration in the distal true lumen at the level of the diaphragm just above the aortic  hiatus. Pulmonary arteries: No lobar, segmental, or proximal subsegmental filling defects. Mediastinum/Nodes: No mediastinal hemorrhage. Mediastinal lymph nodes are present. Gastric tube within the esophagus. Lungs/Pleura: Ground-glass opacities developing through the the bilateral lungs. Developing interlobular septal thickening. Atelectasis of the medial segment left lower lobe. Small low-density left pleural effusion trace right-sided pleural effusion and associated atelectasis. No pneumothorax. Right upper extremity PICC appears to terminate superior vena cava. Review of the MIP images confirms the above findings. CTA ABDOMEN AND PELVIS FINDINGS VASCULAR Aorta: Re- demonstration of dissection flap of the abdominal aorta. No aneurysm.  No periaortic fluid of the abdomen. The true lumen is compressed throughout the abdomen. True lumen contributes to celiac artery origin, superior mesenteric artery origin, and also continues across the origin of the inferior mesenteric artery. The false lumen contributes to perfusion of the right renal artery. The lateral margin of the dissection flap involves the origin of 2 left renal arteries both superior and inferior. True lumen is decompressed in the inferior aorta extending into the bilateral iliac arteries. The bilateral common iliac arteries appear perfused from the false lumen bilaterally. False lumen extends in the bilateral external iliac artery. Likely re- entry tear of distal external iliac arteries bilaterally, on the right image 173, on the left image 171. Dissection flap extends into the bilateral hypogastric arteries which are partially patent with decreased flow. Celiac: Origin of the celiac artery originates from the true lumen which is decompressed. Celiac artery remains patent at this time including the branch vessels. Typical branch pattern of splenic artery, left gastric artery, common hepatic artery. SMA: Superior mesenteric artery origin originates from  the true lumen which is compressed. SMA remains patent at this time. Renals: Right renal artery originates from the false lumen. Right renal artery is perfusing at this time with uniform perfusion of the right kidney. There are 2 left renal arteries, superior and inferior. Both arteries originate near the margin of the dissection flap. The superior left renal artery is partially filling, at the in flexion point of the dissection flap, perfusing superior kidney. The inferior left renal artery appears thrombosed, new from the comparison with enlarging left renal infarction, predominantly of the lower pole. IMA: Origin of the inferior mesenteric artery is thrombosed given the collapsed true lumen at this level. There is re- constitution of the distal inferior mesenteric artery via collateral flow. Right lower extremity: False lumen perfuses the right common iliac artery with collapse of the true lumen. Dissection flap extends into the hypogastric artery, with the distal pelvic branch is partially opacifying. External iliac artery is perfusing from the false lumen, with unremarkable appearance of the distal external iliac artery, common femoral artery, profunda femoris, SFA. There is the appearance of a re- entry tear in the mid right external iliac artery. Left lower extremity: False lumen perfuses the left common iliac artery with collapse of the true lumen. Dissection extends into the hypogastric artery which is partially perfused with opacification of pelvic vessels. Distal external iliac artery an the common femoral artery unremarkable, with patent proximal femoral vessels. There is the appearance of a re- entry tear in the mid left external iliac artery. Veins: Unremarkable appearance of the venous system. Review of the MIP images confirms the above findings. NON-VASCULAR Hepatobiliary: Unremarkable appearance of the liver. Cholecystectomy Pancreas:  Unremarkable appearance of the pancreas. No pericholecystic fluid  or inflammatory changes. Unremarkable ductal system. Spleen: Unremarkable. Adrenals/Urinary Tract: Right adrenal gland unremarkable. Nodule of the left adrenal gland which measures 2.7 cm. Right: Right-sided kidney perfuses uniformly with no hydronephrosis. Left: Progressive left renal infarct with small portion of the superior kidney perfusing. The remainder of the left kidney is hypoperfused, progressed from the comparison. No hydronephrosis. Urinary catheter within the urinary bladder. Stomach/Bowel: Unremarkable appearance of stomach with gastric tube in place. Small bowel borderline dilated and fluid-filled. No transition point. The wall of the small bowel relatively uniformly thin and enhancing, although the study is timed for the arterial phase. No focal wall thickening. No abnormally distended colon. No transition point. Fluid filled colon. No focal wall thickening or pericolonic inflammatory changes. Lymphatic: Multiple lymph nodes in the para-aortic nodal station, none of which are enlarged. Mesenteric: No free fluid or air. No adenopathy. Reproductive: Hysterectomy Other: No hernia. Musculoskeletal: No displaced fracture. Degenerative changes of the spine. 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 Only 1 possible fenestration is identified, in the distal thoracic aorta above the aortic hiatus. There is enlarging diameter of the distal aortic arch with associated inflammatory changes, measuring approximately 3.5 on today's study compared to approximately 3.1 cm previously. The appearance is concerning for progression/expansion of intramural hematoma. Progressing left renal infarction, with near complete thrombosis of superior and inferior renal arteries, both of which originate at the margin of the dissection flap. Bilateral common iliac arteries appear to be perfused from the false lumen, with complete collapse of true lumen  proximally. There does appear to be re- entry to the true lumen in the mid external iliac artery bilaterally, as there is unremarkable appearance of the proximal femoral vasculature including bilateral common iliac arteries. The 3 mesenteric vessels originate from the small true lumen, which is progressively compressed. Celiac artery and superior mesenteric artery remain patent, with occlusion at the origin of the inferior mesenteric artery. Three vessel arch, with all 3 branches remain patent. No evidence of extension of the dissection flap into the branch vessels. These preliminary results were discussed by telephone at the time of interpretation on 06/27/2016 at 12:41 pm with Dr. Curt Jews. Borderline dilated small bowel without transition point or obstruction. Findings may represent ileus and/or nonspecific enteritis. No focal wall thickening to suggest Jassiel Flye ischemia, although the timing of this CTA is specific for arterial evaluation and not the bowel tissues. If ongoing concern for bowel ischemia, would repeat abd/pelvis contrast enhanced-CT portal venous phase. Similar appearance of mixed geographic ground-glass opacities of the bilateral lungs with mild interlobular septal thickening. Again, differential diagnosis includes pulmonary edema, ARDS, atypical infection. Signed, Dulcy Fanny. Earleen Newport, DO [facility] Electronically Signed   By: Corrie Mckusick D.O.   On: 06/27/2016 13:18   Ct Angio Chest/abd/pel For Dissection W And/or W/wo  Result Date: 06/20/2016 CLINICAL DATA:  Substernal chest pain radiating to back EXAM: CT ANGIOGRAPHY CHEST, ABDOMEN AND PELVIS TECHNIQUE: Multidetector CT imaging through the chest, abdomen and pelvis was performed using the standard protocol during bolus administration of intravenous contrast. Multiplanar reconstructed images and MIPs were obtained and reviewed to evaluate the vascular anatomy. CONTRAST:  100 cc Isovue  370 IV COMPARISON:  CT abdomen and pelvis 10/27/2011 FINDINGS: CTA CHEST FINDINGS Cardiovascular: There is the type B aortic dissection beginning in the distal aortic arch just beyond the origin of the great  vessels. The true lumen is compressed by the false lumen. No aneurysm. No pulmonary embolus. Heart is normal size. Mediastinum/Nodes: No mediastinal, hilar, or axillary adenopathy. Lungs/Pleura: Atelectasis or scarring in the lingula. Lungs otherwise clear. No effusions. Musculoskeletal: No acute bony abnormality. Review of the MIP images confirms the above findings. CTA ABDOMEN AND PELVIS FINDINGS VASCULAR Aorta: Dissection continues throughout the abdominal aorta with the celiac artery and superior mesenteric artery arising from the true lumen anteriorly. The dissection may extend into the left renal artery where there is only a small amount of blood flow noted in the proximal and mid left renal artery. Right renal artery appears to arise from the false lumen and is patent. The the true lumen appears thrombosed below the origin of the superior mesenteric artery. Inferior mesenteric artery arises from the thrombosed true lumen and appears occluded proximally, reconstitutes several cm from the origin. Celiac: Patent. SMA: Patent. Renals: As above. IMA: As above. Inflow: To the dissection continues into both common iliac arteries with thrombosed true lumens. The dissection appears to terminate at approximately the level of the common iliac bifurcation bilaterally. Veins: Grossly patent and unremarkable. Review of the MIP images confirms the above findings. NON-VASCULAR Hepatobiliary: Mild diffuse fatty infiltration. Prior cholecystectomy. Pancreas: No focal abnormality or ductal dilatation. Spleen: No focal abnormality.  Normal size. Adrenals/Urinary Tract: Nodules in the left adrenal gland are low-density on the precontrast imaging compatible with small adenomas. There are areas of non perfusion noted in the mid  and lower poles of the left kidney compatible with infarction, likely related to the involvement of the left renal artery by dissection. Urinary bladder unremarkable. Stomach/Bowel: Stomach, large and small bowel grossly unremarkable. Lymphatic: No adenopathy. Reproductive: Prior hysterectomy.  No adnexal masses. Other: No free fluid or free air. Musculoskeletal: No acute bony abnormality. Review of the MIP images confirms the above findings. 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 This involves the descending thoracic aorta, abdominal aorta and iliac vessels. The true lumen is compressed by the larger false lumen, and is thrombosed below the SMA/renal artery origins. The dissection appears to extend into the left renal artery with partial occlusion and areas of infarct in the mid and lower pole of the left kidney. Fatty infiltration of the liver. Critical Value/emergent results were called by telephone at the time of interpretation on 06/20/2016 at 10:13 am to Dr. Fredia Sorrow , who verbally acknowledged these results. Electronically Signed   By: Rolm Baptise M.D.   On: 06/20/2016 10:15   Anti-infectives: Anti-infectives    Start     Dose/Rate Route Frequency Ordered Stop   06/26/16 0830  meropenem (MERREM) 1 g in sodium chloride 0.9 % 100 mL IVPB     1 g 200 mL/hr over 30 Minutes Intravenous Every 8 hours 06/26/16 0810     06/22/16 1800  clindamycin (CLEOCIN) IVPB 600 mg  Status:  Discontinued     600 mg 100 mL/hr over 30 Minutes Intravenous Every 8 hours 06/22/16 1723 06/26/16 0810   06/22/16 1000  azithromycin (ZITHROMAX) 250 mg in dextrose 5 % 125 mL IVPB  Status:  Discontinued     250 mg 125 mL/hr over 60 Minutes Intravenous Every 24 hours 06/22/16 0826 06/27/16 1019   06/22/16 0900  levofloxacin (LEVAQUIN) IVPB 500 mg  Status:  Discontinued     500 mg 100 mL/hr over 60 Minutes Intravenous Every 24 hours 06/22/16 0759  06/22/16 6789  Assessment/Plan: s/p * No surgery found * Remained stable. Able to increase activity. Still with ileus with some liquid bowel movements. NG has been replaced. At of town until Monday. Will see again Monday. Please call my partners for vascular issues over the weekend.   LOS: 13 days   Curt Jews 07/03/2016, 4:13 PM

## 2016-07-04 ENCOUNTER — Inpatient Hospital Stay (HOSPITAL_COMMUNITY): Payer: BLUE CROSS/BLUE SHIELD

## 2016-07-04 LAB — CBC
HCT: 32.6 % — ABNORMAL LOW (ref 36.0–46.0)
Hemoglobin: 10.1 g/dL — ABNORMAL LOW (ref 12.0–15.0)
MCH: 32 pg (ref 26.0–34.0)
MCHC: 31 g/dL (ref 30.0–36.0)
MCV: 103.2 fL — ABNORMAL HIGH (ref 78.0–100.0)
Platelets: 300 10*3/uL (ref 150–400)
RBC: 3.16 MIL/uL — ABNORMAL LOW (ref 3.87–5.11)
RDW: 14.2 % (ref 11.5–15.5)
WBC: 10.7 10*3/uL — ABNORMAL HIGH (ref 4.0–10.5)

## 2016-07-04 LAB — BASIC METABOLIC PANEL
Anion gap: 9 (ref 5–15)
BUN: 51 mg/dL — ABNORMAL HIGH (ref 6–20)
CO2: 26 mmol/L (ref 22–32)
Calcium: 8.4 mg/dL — ABNORMAL LOW (ref 8.9–10.3)
Chloride: 108 mmol/L (ref 101–111)
Creatinine, Ser: 1.2 mg/dL — ABNORMAL HIGH (ref 0.44–1.00)
GFR calc Af Amer: 56 mL/min — ABNORMAL LOW (ref >60)
GFR calc non Af Amer: 48 mL/min — ABNORMAL LOW (ref >60)
Glucose, Bld: 166 mg/dL — ABNORMAL HIGH (ref 65–99)
Potassium: 3.3 mmol/L — ABNORMAL LOW (ref 3.5–5.1)
Sodium: 143 mmol/L (ref 135–145)

## 2016-07-04 LAB — GLUCOSE, CAPILLARY
Glucose-Capillary: 103 mg/dL — ABNORMAL HIGH (ref 65–99)
Glucose-Capillary: 139 mg/dL — ABNORMAL HIGH (ref 65–99)
Glucose-Capillary: 150 mg/dL — ABNORMAL HIGH (ref 65–99)
Glucose-Capillary: 154 mg/dL — ABNORMAL HIGH (ref 65–99)
Glucose-Capillary: 158 mg/dL — ABNORMAL HIGH (ref 65–99)
Glucose-Capillary: 164 mg/dL — ABNORMAL HIGH (ref 65–99)
Glucose-Capillary: 210 mg/dL — ABNORMAL HIGH (ref 65–99)

## 2016-07-04 MED ORDER — POTASSIUM CHLORIDE 10 MEQ/50ML IV SOLN
10.0000 meq | INTRAVENOUS | Status: AC
  Administered 2016-07-04 (×6): 10 meq via INTRAVENOUS
  Filled 2016-07-04: qty 50

## 2016-07-04 MED ORDER — SODIUM CHLORIDE 0.9 % IV SOLN
INTRAVENOUS | Status: DC
  Administered 2016-07-04 – 2016-07-28 (×8): via INTRAVENOUS
  Administered 2016-08-01: 10 mL/h via INTRAVENOUS
  Administered 2016-08-06 – 2016-08-17 (×3): via INTRAVENOUS

## 2016-07-04 MED ORDER — FAT EMULSION 20 % IV EMUL
240.0000 mL | INTRAVENOUS | Status: AC
  Administered 2016-07-04: 240 mL via INTRAVENOUS
  Filled 2016-07-04: qty 250

## 2016-07-04 MED ORDER — TRACE MINERALS CR-CU-MN-SE-ZN 10-1000-500-60 MCG/ML IV SOLN
INTRAVENOUS | Status: AC
  Administered 2016-07-04: 18:00:00 via INTRAVENOUS
  Filled 2016-07-04: qty 1992

## 2016-07-04 NOTE — Progress Notes (Addendum)
  PHARMACY - ADULT TOTAL PARENTERAL NUTRITION CONSULT NOTE   Pharmacy Consult for TPN Indication: prolonged NPO status/high NG output   Patient Measurements: Height: '5\' 3"'$  (160 cm) Weight: 184 lb 4.9 oz (83.6 kg) IBW/kg (Calculated) : 52.4 TPN AdjBW (KG): 60.2 Body mass index is 32.65 kg/m. 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 Patient had a significant hx of N/V for 2-4 weeks prior to admission. Weight appears to have been maintained. Patient had a NG tube placed 5/20 which had significant output with 1-1.5 L a day, removed NG tube on 5/30, however, patient with significant episodes of N&V and NG replaced 6/1 with continuous low suction and is now NPO.   GI: NG tube removed 5/30 - replaced 6/1. Significant episodes of emesis overnight.  Moderate pain to abdomen but hasn't changed significantly.  Prealbumin down to 9.0 and albumin down to 1.8.  Phenergan Q 6 prn, bisacodyl, simethicone.  She is now having some diarrhea - C.Diff neg.  Tried clear liquids, but failed to tolerate and now placed back to NPO.  Abdominal x-ray on 5/31 shows abnormal dilated small intestine - SBO vs. Enteritis  Endo: Hx of DM. No insulin at home. CBGs better after Lantus increase (152-166 this am).  No hypoglycemic events. - Insulin requirements in the past 24 hours: 18 units of aspart; 80 units of Lantus  Lytes: Na-140, K still down after 133mq last 24 hours.  Still getting Lasix.  Have ordered 621m again today.  CoCa-10.2, Mg and Phos wnl.   Renal:AKI: Scr 1.2  (Baseline < 1). BUN stable 51. UOP good. - 2.2 L yesterday Pulm: RA; Dulera; Brovana, Albuterol  Cards: Stanford type B aortic dissection; BP up today had been soft, HR 70s-90s in NSR. Lopressor  50 mg bid , clonidine 0.3 mg patch , nitroglycerin 0.'4mg'$  patch, lasix 40 bid, hydralazine 10 mg Q 4.  Hepatobil:AST/ALT up to 70/83. Alk phos up to 151, Tbili up to 1.3.  Triglycerides back down to 167 Neuro: Hydromorphone prn IDKN:[id number] Meropenem (D#9) for respiratory failure from PNA. Clindamycin and azithromycin stopped. WBC down to 10.7 (peaked at 36.7) Afebrile. No growth on cultures  Best Practices:PPI TPN Access:PICC line placed 5/20 TPN start date:5/22  Nutritional Goals (per RD recommendation on 5/25): KCal: 2000-2200 kcal/day  Protein: 110-120 gm/day   Goal  TPN rate: Clinimix 5/15 E @ 83 ml/hr + lipid emulsion 20 ml/hr   Current Nutrition:   TPN Clear liquid diet   Plan:  Continue Clinimix E 5/15 at 83 ml/hr Continue lipid emulsion 20% at 20 ml/hr  This provides 100 g of protein and 1894 kCals per day meeting 91% of protein and 95% of kCal needs Continue MVI in TPN Continue TE in TPN every other day, next 6/3 Continue Lantus 40 units BID Continue resistant SSI and adjust as needed F/U ability to transition to TFs/advance diet  Supplement potassium with 6066mtoday F/U BMET in am   NitRober MinionharmD., MS Clinical Pharmacist Pager:  [phone] you for allowing pharmacy to be part of this patients care team. 07/04/2016 7:36 AM

## 2016-07-04 NOTE — Progress Notes (Signed)
   Subjective/Chief Complaint: Reports less distension Having more liquid BM's NG output still high   Objective: Vital signs in last 24 hours: Temp:  [97.1 F (36.2 C)-98.5 F (36.9 C)] 97.8 F (36.6 C) (06/02 0757) Pulse Rate:  [72-95] 94 (06/02 0700) Resp:  [12-24] 15 (06/02 0700) BP: (96-160)/(45-111) 160/67 (06/02 0700) SpO2:  [91 %-100 %] 96 % (06/02 0700) Weight:  [83.6 kg (184 lb 4.9 oz)] 83.6 kg (184 lb 4.9 oz) (06/02 0500) Last BM Date: 07/03/16  Intake/Output from previous day: 06/01 0701 - 06/02 0700 In: 3321.1 [P.O.:80; I.V.:2211.1; IV Piggyback:1030] Out: 6659 [Urine:3490; Emesis/NG output:1800; Stool:650] Intake/Output this shift: Total I/O In: -  Out: 69 [Urine:460]  Exam: Awake and alert Comfortable Abdomen soft, obese, non tender Lab Results:   Recent Labs  07/02/16 0407 07/04/16 0440  WBC 13.7* 10.7*  HGB 11.1* 10.1*  HCT 34.5* 32.6*  PLT 264 300   BMET  Recent Labs  07/03/16 0430 07/04/16 0440  NA 140 143  K 3.2* 3.3*  CL 107 108  CO2 26 26  GLUCOSE 152* 166*  BUN 48* 51*  CREATININE 1.16* 1.20*  CALCIUM 8.5* 8.4*   PT/INR No results for input(s): LABPROT, INR in the last 72 hours. ABG No results for input(s): PHART, HCO3 in the last 72 hours.  Invalid input(s): PCO2, PO2  Studies/Results: No results found.  Anti-infectives: Anti-infectives    Start     Dose/Rate Route Frequency Ordered Stop   06/26/16 0830  meropenem (MERREM) 1 g in sodium chloride 0.9 % 100 mL IVPB     1 g 200 mL/hr over 30 Minutes Intravenous Every 8 hours 06/26/16 0810     06/22/16 1800  clindamycin (CLEOCIN) IVPB 600 mg  Status:  Discontinued     600 mg 100 mL/hr over 30 Minutes Intravenous Every 8 hours 06/22/16 1723 06/26/16 0810   06/22/16 1000  azithromycin (ZITHROMAX) 250 mg in dextrose 5 % 125 mL IVPB  Status:  Discontinued     250 mg 125 mL/hr over 60 Minutes Intravenous Every 24 hours 06/22/16 0826 06/27/16 1019   06/22/16 0900   levofloxacin (LEVAQUIN) IVPB 500 mg  Status:  Discontinued     500 mg 100 mL/hr over 60 Minutes Intravenous Every 24 hours 06/22/16 0759 06/22/16 0824      Assessment/Plan: s/p * No surgery found *  Postop ileus  Clinically improving although NG output still high and xray today looks about the same  Would continue NG to suction for another day  LOS: 14 days    Haley Graham A 07/04/2016

## 2016-07-04 NOTE — Progress Notes (Signed)
      [address]       [place] [address]             [phone]        CARDIOTHORACIC SURGERY PROGRESS NOTE  Subjective: Reports feeling a little better.  Denies abdominal pain  Objective: Vital signs: BP Readings from Last 1 Encounters:  07/04/16 (!) 125/59   Pulse Readings from Last 1 Encounters:  07/04/16 79   Resp Readings from Last 1 Encounters:  07/04/16 (!) 21   Temp Readings from Last 1 Encounters:  07/04/16 97.8 F (36.6 C) (Oral)    Hemodynamics:    Physical Exam:  Rhythm:   sinus  Breath sounds: Diminished at bases  Heart sounds:  RRR  Incisions:  n/a  Abdomen:  Soft, mildly-distended, non-tender  Extremities:  Warm, well-perfused    Intake/Output from previous day: 06/01 0701 - 06/02 0700 In: 3321.1 [P.O.:80; I.V.:2211.1; IV Piggyback:1030] Out: 2130 [Urine:3490; Emesis/NG output:1800; Stool:650] Intake/Output this shift: Total I/O In: 30 [NG/GT:30] Out: 1110 [Urine:1010; Emesis/NG output:100]  Lab Results:  CBC: Recent Labs  07/02/16 0407 07/04/16 0440  WBC 13.7* 10.7*  HGB 11.1* 10.1*  HCT 34.5* 32.6*  PLT 264 300    BMET:  Recent Labs  07/03/16 0430 07/04/16 0440  NA 140 143  K 3.2* 3.3*  CL 107 108  CO2 26 26  GLUCOSE 152* 166*  BUN 48* 51*  CREATININE 1.16* 1.20*  CALCIUM 8.5* 8.4*     PT/INR:  No results for input(s): LABPROT, INR in the last 72 hours.  CBG (last 3)   Recent Labs  07/04/16 0034 07/04/16 0323 07/04/16 0801  GLUCAP 139* 150* 158*    ABG    Component Value Date/Time   PHART 7.430 06/28/2016 1615   PCO2ART 31.2 (L) 06/28/2016 1615   PO2ART 64.0 (L) 06/28/2016 1615   HCO3 20.7 06/28/2016 1615   TCO2 22 06/28/2016 1615   ACIDBASEDEF 3.0 (H) 06/28/2016 1615   O2SAT 93.0 06/28/2016 1615    CXR: PORTABLE CHEST 1 VIEW  COMPARISON:  07/01/2016  FINDINGS: Nasogastric tube enters the stomach. Right arm PICC tip is in the SVC above right atrium. Less pulmonary edema.  Persistent left effusion. Persistent volume loss in both lower lobes left worse than right. Chronic prominence of the aortic shadow consistent with the history of dissection.  IMPRESSION: Less pulmonary edema. Persistent left effusion. Persistent atelectasis in both lower lobes left more than right. Persistent prominent aortic/mediastinal shadow.   Electronically Signed   By: Nelson Chimes M.D.   On: 07/04/2016 08:11  Assessment/Plan:  Overall stable but slow progress limited by ileus Plan per general surgery Supplement potassium Continue current Rx for hypertension  Rexene Alberts, MD 07/04/2016 9:33 AM

## 2016-07-05 LAB — GLUCOSE, CAPILLARY
Glucose-Capillary: 136 mg/dL — ABNORMAL HIGH (ref 65–99)
Glucose-Capillary: 209 mg/dL — ABNORMAL HIGH (ref 65–99)
Glucose-Capillary: 212 mg/dL — ABNORMAL HIGH (ref 65–99)
Glucose-Capillary: 163 mg/dL — ABNORMAL HIGH (ref 65–99)

## 2016-07-05 LAB — BASIC METABOLIC PANEL
Anion gap: 12 (ref 5–15)
BUN: 52 mg/dL — AB (ref 6–20)
CALCIUM: 8.6 mg/dL — AB (ref 8.9–10.3)
CO2: 24 mmol/L (ref 22–32)
CREATININE: 1.11 mg/dL — AB (ref 0.44–1.00)
Chloride: 102 mmol/L (ref 101–111)
GFR, EST NON AFRICAN AMERICAN: 53 mL/min — AB (ref >60)
Glucose, Bld: 182 mg/dL — ABNORMAL HIGH (ref 65–99)
Potassium: 2.8 mmol/L — ABNORMAL LOW (ref 3.5–5.1)
SODIUM: 138 mmol/L (ref 135–145)

## 2016-07-05 MED ORDER — LABETALOL HCL 5 MG/ML IV SOLN
10.0000 mg | INTRAVENOUS | Status: DC | PRN
  Administered 2016-07-05 – 2016-07-21 (×8): 10 mg via INTRAVENOUS
  Administered 2016-08-06 (×3): 5 mg via INTRAVENOUS
  Filled 2016-07-05 (×7): qty 4

## 2016-07-05 MED ORDER — LORAZEPAM 2 MG/ML IJ SOLN
0.5000 mg | Freq: Four times a day (QID) | INTRAMUSCULAR | Status: DC | PRN
  Administered 2016-07-05: 0.5 mg via INTRAVENOUS
  Filled 2016-07-05: qty 1

## 2016-07-05 MED ORDER — POTASSIUM CHLORIDE 10 MEQ/50ML IV SOLN
10.0000 meq | INTRAVENOUS | Status: AC
  Administered 2016-07-05 (×6): 10 meq via INTRAVENOUS
  Filled 2016-07-05: qty 50

## 2016-07-05 MED ORDER — FAT EMULSION 20 % IV EMUL
240.0000 mL | INTRAVENOUS | Status: AC
  Administered 2016-07-05: 240 mL via INTRAVENOUS
  Filled 2016-07-05: qty 250

## 2016-07-05 MED ORDER — TRACE MINERALS CR-CU-MN-SE-ZN 10-1000-500-60 MCG/ML IV SOLN
INTRAVENOUS | Status: AC
  Administered 2016-07-05: 17:00:00 via INTRAVENOUS
  Filled 2016-07-05: qty 1992

## 2016-07-05 MED ORDER — FUROSEMIDE 10 MG/ML IJ SOLN
40.0000 mg | Freq: Every day | INTRAMUSCULAR | Status: DC
  Filled 2016-07-05: qty 4

## 2016-07-05 NOTE — Progress Notes (Signed)
  PHARMACY - ADULT TOTAL PARENTERAL NUTRITION CONSULT NOTE   Pharmacy Consult for TPN Indication: prolonged NPO status/high NG output   Patient Measurements: Height: '5\' 3"'$  (160 cm) Weight: 181 lb 7 oz (82.3 kg) IBW/kg (Calculated) : 52.4 TPN AdjBW (KG): 60.2 Body mass index is 32.14 kg/m. 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 Patient had a significant hx of N/V for 2-4 weeks prior to admission. Weight appears to have been maintained. Patient had a NG tube placed 5/20 which had significant output with 1-1.5 L a day, removed NG tube on 5/30, however, patient with significant episodes of N&V and NG replaced 6/1 with continuous low suction.  Still with significant output - plan is to  continue NG another 24 hours.   GI: NG tube removed 5/30 - replaced 6/1 (NG output 1.25L 6/2). Moderate pain to abdomen but hasn't changed significantly.  Prealbumin down to 9.0 and albumin down to 1.8.  Phenergan Q 6 prn, bisacodyl, simethicone, last BM 6/2.  She is now having some diarrhea - C.Diff neg.  Tried clear liquids, but failed to tolerate and now placed back to NPO.  Abdominal x-ray on 5/31 shows abnormal dilated small intestine - SBO vs. Enteritis  Endo: Hx of DM. No insulin at home. CBGs better after Lantus increase (154-166 this am).  No hypoglycemic events.  Insulin requirements in the past 24 hours: 18 units of aspart; 80 units of Lantus  Lytes: No B-met this morning, however, K+ has been low with ongoing IV Lasix.  CoCa-10.2, Mg and Phos wnl.   Renal:AKI: Scr 1.2  (Baseline < 1). UOP good. - 1.4 L yesterday Pulm: RA; Dulera; Brovana, Albuterol  Cards: Stanford type B aortic dissection; BP up today had been soft, HR 70s-90s in NSR. Lopressor  50 mg bid , clonidine 0.3 mg patch , nitroglycerin 0.'4mg'$  patch, lasix 40 bid, hydralazine 10 mg Q 4.  Hepatobil:AST/ALT up to 70/83. Alk phos up to 151, Tbili up to 1.3.  Triglycerides back down to 167 Neuro: Hydromorphone prn BU:[id number] on Meropenem (D#10) for respiratory failure from PNA. Clindamycin and azithromycin stopped. WBC down to 10.7 (peaked at 36.7) Afebrile. No growth on cultures  Best Practices:PPI TPN Access:PICC line placed 5/20 TPN start date:5/22  Nutritional Goals (per RD recommendation on 5/25): KCal: 2000-2200 kcal/day  Protein: 110-120 gm/day   Goal  TPN rate: Clinimix 5/15 E @ 83 ml/hr + lipid emulsion 20 ml/hr   Current Nutrition:   TPN Clear liquid diet   Plan:  Continue Clinimix E 5/15 at 83 ml/hr Continue lipid emulsion 20% at 20 ml/hr  This provides 100 g of protein and 1894 kCals per day meeting 91% of protein and 95% of kCal needs Continue MVI in TPN Continue TE in TPN every other day, next 6/5 Continue Lantus 40 units BID Continue resistant SSI and adjust as needed F/U ability to transition to TFs/advance diet  F/U BMET in am   Rober Minion, PharmD., MS Clinical Pharmacist Pager:  [phone] Thank you for allowing pharmacy to be part of this patients care team. 07/05/2016 8:12 AM

## 2016-07-05 NOTE — Progress Notes (Signed)
Progress Note: General Surgery Service   Assessment/Plan: Patient Active Problem List   Diagnosis Date Noted  . 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 Unstable angina pectoris ([place])   . SOB (shortness of breath)   . 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 Change in stool 05/28/2016  . Gastroparesis 11/19/2011  . Nausea 11/19/2011  . Dysphagia 11/11/2011  . GERD (gastroesophageal reflux disease) 11/11/2011   Ileus vs gastroparesis -remove NG -ok for ice chips and water   LOS: 15 days  Chief Complaint/Subjective: Tolerating ice chips, formed BM overnight  Objective: Vital signs in last 24 hours: Temp:  [98 F (36.7 C)-98.8 F (37.1 C)] 98 F (36.7 C) (06/03 0758) Pulse Rate:  [75-106] 77 (06/03 0900) Resp:  [13-24] 22 (06/03 0900) BP: (109-159)/(54-80) 133/54 (06/03 0900) SpO2:  [91 %-100 %] 98 % (06/03 0900) Weight:  [82.3 kg (181 lb 7 oz)] 82.3 kg (181 lb 7 oz) (06/03 0332) Last BM Date: 07/04/16  Intake/Output from previous day: 06/02 0701 - 06/03 0700 In: 2775.5 [I.V.:2085.5; NG/GT:90; IV Piggyback:600] Out: 5102 [Urine:3845; Emesis/NG output:1700; Stool:1] Intake/Output this shift: Total I/O In: 289 [I.V.:186; NG/GT:3; IV Piggyback:100] Out: 1350 [Urine:600; Emesis/NG output:750]  Lungs: CTAB  Cardiovascular: RRR  Abd: soft, NT, ND  Extremities: no edema  Neuro: AOx4  Lab Results: CBC   Recent Labs  07/04/16 0440  WBC 10.7*  HGB 10.1*  HCT 32.6*  PLT 300   BMET  Recent Labs  07/03/16 0430 07/04/16 0440  NA 140 143  K 3.2* 3.3*  CL 107 108  CO2 26 26  GLUCOSE 152* 166*  BUN 48* 51*  CREATININE 1.16* 1.20*  CALCIUM 8.5* 8.4*   PT/INR No results for input(s): LABPROT, INR in the last 72 hours. ABG No results for input(s): PHART, HCO3 in the last 72 hours.  Invalid input(s): PCO2, PO2  Studies/Results:  Anti-infectives: Anti-infectives    Start     Dose/Rate Route Frequency Ordered Stop   06/26/16 0830  meropenem (MERREM) 1 g in sodium chloride 0.9 % 100 mL IVPB     1 g 200 mL/hr over 30 Minutes Intravenous Every 8 hours 06/26/16 0810     06/22/16 1800  clindamycin (CLEOCIN) IVPB 600 mg  Status:  Discontinued     600 mg 100 mL/hr over 30 Minutes Intravenous Every 8 hours 06/22/16 1723 06/26/16 0810   06/22/16 1000  azithromycin (ZITHROMAX) 250 mg in dextrose 5 % 125 mL IVPB  Status:  Discontinued     250 mg 125 mL/hr over 60 Minutes Intravenous Every 24 hours 06/22/16 0826 06/27/16 1019   06/22/16 0900  levofloxacin (LEVAQUIN) IVPB 500 mg  Status:  Discontinued     500 mg 100 mL/hr over 60 Minutes Intravenous Every 24 hours 06/22/16 0759 06/22/16 0824      Medications: Scheduled Meds: . arformoterol  15 mcg Nebulization BID  . budesonide (PULMICORT) nebulizer solution  0.5 mg Nebulization BID  . chlorhexidine  15 mL Mouth Rinse BID  . Chlorhexidine Gluconate Cloth  6 each Topical Daily  . clonazePAM  1 mg Oral QHS  . cloNIDine  0.3 mg Transdermal Weekly  . furosemide  40 mg Intravenous Q12H  . hydrALAZINE  10 mg Intravenous Q4H  . insulin aspart  0-24 Units Subcutaneous Q4H  . insulin glargine  40 Units Subcutaneous BID  . labetalol  5 mg Intravenous Once  . mouth rinse  15 mL Mouth Rinse q12n4p  . metoprolol tartrate  5 mg Intravenous Q6H  . nitroGLYCERIN  0.4 mg Transdermal Daily  . pantoprazole (PROTONIX) IV  40 mg Intravenous Q24H  . sodium chloride flush  10-40 mL Intracatheter Q12H   Continuous Infusions: . sodium chloride    . sodium chloride 10 mL/hr at 07/05/16 0900  . Marland KitchenTPN (CLINIMIX-E) Adult     And  . fat emulsion    . meropenem (MERREM) IV Stopped (07/05/16 0843)  . Marland KitchenTPN (CLINIMIX-E) Adult 83 mL/hr at 07/05/16 0900   PRN Meds:.Place/Maintain arterial line **AND** sodium chloride, acetaminophen **OR** acetaminophen, albuterol, HYDROmorphone (DILAUDID) injection, labetalol, ondansetron (ZOFRAN) IV, pneumococcal 23 valent vaccine, promethazine,  simethicone, sodium chloride flush  Mickeal Skinner, MD Pg# [phone] [facility]

## 2016-07-05 NOTE — Progress Notes (Signed)
      [address]       [place] [address]             [phone]        CARDIOTHORACIC SURGERY PROGRESS NOTE  Subjective: Feels weak.  Denies abdominal discomfort.  No SOB  Objective: Vital signs: BP Readings from Last 1 Encounters:  07/05/16 (!) 133/54   Pulse Readings from Last 1 Encounters:  07/05/16 77   Resp Readings from Last 1 Encounters:  07/05/16 (!) 22   Temp Readings from Last 1 Encounters:  07/05/16 98 F (36.7 C) (Oral)    Hemodynamics: CVP:  [6 mmHg] 6 mmHg  Physical Exam:  Rhythm:   sinus  Breath sounds: clear  Heart sounds:  RRR  Incisions:  n/a  Abdomen:  Soft, non-distended, non-tender  Extremities:  Warm, well-perfused    Intake/Output from previous day: 06/02 0701 - 06/03 0700 In: 2775.5 [I.V.:2085.5; NG/GT:90; IV Piggyback:600] Out: 3235 [Urine:3845; Emesis/NG output:1700; Stool:1] Intake/Output this shift: Total I/O In: 289 [I.V.:186; NG/GT:3; IV Piggyback:100] Out: 1350 [Urine:600; Emesis/NG output:750]  Lab Results:  CBC: Recent Labs  07/04/16 0440  WBC 10.7*  HGB 10.1*  HCT 32.6*  PLT 300    BMET:  Recent Labs  07/03/16 0430 07/04/16 0440  NA 140 143  K 3.2* 3.3*  CL 107 108  CO2 26 26  GLUCOSE 152* 166*  BUN 48* 51*  CREATININE 1.16* 1.20*  CALCIUM 8.5* 8.4*     PT/INR:  No results for input(s): LABPROT, INR in the last 72 hours.  CBG (last 3)   Recent Labs  07/04/16 2340 07/05/16 0338 07/05/16 0810  GLUCAP 154* 136* 209*    ABG    Component Value Date/Time   PHART 7.430 06/28/2016 1615   PCO2ART 31.2 (L) 06/28/2016 1615   PO2ART 64.0 (L) 06/28/2016 1615   HCO3 20.7 06/28/2016 1615   TCO2 22 06/28/2016 1615   ACIDBASEDEF 3.0 (H) 06/28/2016 1615   O2SAT 93.0 06/28/2016 1615    CXR: n/a  Assessment/Plan:  Overall stable Maintaining NSR w/ adequate BP control Ileus seems to be resolving w/ abdominal exam improved, NG tube out Diuresing well last 3 days and weight <  baseline at admission Hypokalemia, induced by loop diuretics   Advance diet per General Surgery  Continue TNA for now  Mobilize  Decrease lasix dose  PT consult  Rexene Alberts, MD 07/05/2016 9:49 AM

## 2016-07-06 ENCOUNTER — Inpatient Hospital Stay (HOSPITAL_COMMUNITY): Payer: BLUE CROSS/BLUE SHIELD

## 2016-07-06 DIAGNOSIS — I7102 Dissection of abdominal aorta: Secondary | ICD-10-CM

## 2016-07-06 LAB — PREALBUMIN: PREALBUMIN: 17.7 mg/dL — AB (ref 18–38)

## 2016-07-06 LAB — CBC
HCT: 32.2 % — ABNORMAL LOW (ref 36.0–46.0)
Hemoglobin: 10.1 g/dL — ABNORMAL LOW (ref 12.0–15.0)
MCH: 32.1 pg (ref 26.0–34.0)
MCHC: 31.4 g/dL (ref 30.0–36.0)
MCV: 102.2 fL — AB (ref 78.0–100.0)
PLATELETS: 317 10*3/uL (ref 150–400)
RBC: 3.15 MIL/uL — AB (ref 3.87–5.11)
RDW: 14.1 % (ref 11.5–15.5)
WBC: 14 10*3/uL — AB (ref 4.0–10.5)

## 2016-07-06 LAB — TRIGLYCERIDES: Triglycerides: 168 mg/dL — ABNORMAL HIGH (ref <150)

## 2016-07-06 LAB — DIFFERENTIAL
BASOS ABS: 0.3 10*3/uL — AB (ref 0.0–0.1)
Basophils Relative: 2 %
Eosinophils Absolute: 0.1 10*3/uL (ref 0.0–0.7)
Eosinophils Relative: 1 %
LYMPHS ABS: 2.1 10*3/uL (ref 0.7–4.0)
Lymphocytes Relative: 15 %
MONO ABS: 2 10*3/uL — AB (ref 0.1–1.0)
Monocytes Relative: 14 %
Neutro Abs: 9.5 10*3/uL — ABNORMAL HIGH (ref 1.7–7.7)
Neutrophils Relative %: 68 %

## 2016-07-06 LAB — COMPREHENSIVE METABOLIC PANEL
ALK PHOS: 128 U/L — AB (ref 38–126)
ALT: 53 U/L (ref 14–54)
AST: 45 U/L — AB (ref 15–41)
Albumin: 1.6 g/dL — ABNORMAL LOW (ref 3.5–5.0)
Anion gap: 9 (ref 5–15)
BUN: 48 mg/dL — AB (ref 6–20)
CALCIUM: 8.5 mg/dL — AB (ref 8.9–10.3)
CHLORIDE: 102 mmol/L (ref 101–111)
CO2: 26 mmol/L (ref 22–32)
CREATININE: 1.21 mg/dL — AB (ref 0.44–1.00)
GFR calc Af Amer: 56 mL/min — ABNORMAL LOW (ref >60)
GFR, EST NON AFRICAN AMERICAN: 48 mL/min — AB (ref >60)
Glucose, Bld: 179 mg/dL — ABNORMAL HIGH (ref 65–99)
Potassium: 3.6 mmol/L (ref 3.5–5.1)
Sodium: 137 mmol/L (ref 135–145)
Total Bilirubin: 1.2 mg/dL (ref 0.3–1.2)
Total Protein: 5.9 g/dL — ABNORMAL LOW (ref 6.5–8.1)

## 2016-07-06 LAB — GLUCOSE, CAPILLARY
Glucose-Capillary: 125 mg/dL — ABNORMAL HIGH (ref 65–99)
Glucose-Capillary: 143 mg/dL — ABNORMAL HIGH (ref 65–99)
Glucose-Capillary: 147 mg/dL — ABNORMAL HIGH (ref 65–99)
Glucose-Capillary: 174 mg/dL — ABNORMAL HIGH (ref 65–99)
Glucose-Capillary: 180 mg/dL — ABNORMAL HIGH (ref 65–99)
Glucose-Capillary: 190 mg/dL — ABNORMAL HIGH (ref 65–99)
Glucose-Capillary: 213 mg/dL — ABNORMAL HIGH (ref 65–99)

## 2016-07-06 LAB — MAGNESIUM: Magnesium: 1.9 mg/dL (ref 1.7–2.4)

## 2016-07-06 LAB — PHOSPHORUS: PHOSPHORUS: 3.7 mg/dL (ref 2.5–4.6)

## 2016-07-06 MED ORDER — MAGNESIUM SULFATE IN D5W 1-5 GM/100ML-% IV SOLN
1.0000 g | Freq: Once | INTRAVENOUS | Status: AC
  Administered 2016-07-06: 1 g via INTRAVENOUS
  Filled 2016-07-06: qty 100

## 2016-07-06 MED ORDER — FUROSEMIDE 40 MG PO TABS: 40.0000 mg | ORAL_TABLET | Freq: Every day | ORAL | Status: DC

## 2016-07-06 MED ORDER — POTASSIUM CHLORIDE 10 MEQ/100ML IV SOLN: 10.0000 meq | INTRAVENOUS | Status: DC

## 2016-07-06 MED ORDER — FUROSEMIDE 10 MG/ML IJ SOLN
40.0000 mg | Freq: Every day | INTRAMUSCULAR | Status: DC
  Administered 2016-07-07 – 2016-07-16 (×10): 40 mg via INTRAVENOUS
  Filled 2016-07-06 (×10): qty 4

## 2016-07-06 MED ORDER — METOPROLOL TARTRATE 50 MG PO TABS
50.0000 mg | ORAL_TABLET | Freq: Two times a day (BID) | ORAL | Status: DC
  Administered 2016-07-06: 50 mg via ORAL
  Filled 2016-07-06: qty 1

## 2016-07-06 MED ORDER — POTASSIUM CHLORIDE 10 MEQ/50ML IV SOLN
10.0000 meq | INTRAVENOUS | Status: AC
  Administered 2016-07-06 (×3): 10 meq via INTRAVENOUS
  Filled 2016-07-06 (×3): qty 50

## 2016-07-06 MED ORDER — TRACE MINERALS CR-CU-MN-SE-ZN 10-1000-500-60 MCG/ML IV SOLN
INTRAVENOUS | Status: AC
  Administered 2016-07-06: 17:00:00 via INTRAVENOUS
  Filled 2016-07-06: qty 1992

## 2016-07-06 MED ORDER — METOCLOPRAMIDE HCL 5 MG/ML IJ SOLN
10.0000 mg | Freq: Four times a day (QID) | INTRAMUSCULAR | Status: AC
  Administered 2016-07-06 – 2016-07-07 (×4): 10 mg via INTRAVENOUS
  Filled 2016-07-06 (×4): qty 2

## 2016-07-06 MED ORDER — METOPROLOL TARTRATE 5 MG/5ML IV SOLN
5.0000 mg | Freq: Four times a day (QID) | INTRAVENOUS | Status: DC
  Administered 2016-07-06 – 2016-07-14 (×32): 5 mg via INTRAVENOUS
  Filled 2016-07-06 (×30): qty 5

## 2016-07-06 MED ORDER — GERHARDT'S BUTT CREAM
TOPICAL_CREAM | CUTANEOUS | Status: DC | PRN
  Administered 2016-07-19: 1 via TOPICAL
  Administered 2016-07-24 – 2016-07-27 (×3): via TOPICAL
  Administered 2016-08-08 – 2016-08-09 (×2): 1 via TOPICAL
  Administered 2016-08-10: 06:00:00 via TOPICAL
  Filled 2016-07-06 (×2): qty 1

## 2016-07-06 MED ORDER — FAT EMULSION 20 % IV EMUL
240.0000 mL | INTRAVENOUS | Status: AC
  Administered 2016-07-06: 240 mL via INTRAVENOUS
  Filled 2016-07-06: qty 250

## 2016-07-06 MED ORDER — ALPRAZOLAM 0.5 MG PO TABS
0.5000 mg | ORAL_TABLET | Freq: Two times a day (BID) | ORAL | Status: DC
  Administered 2016-07-06 – 2016-07-11 (×9): 0.5 mg via ORAL
  Filled 2016-07-06 (×10): qty 1

## 2016-07-06 NOTE — Progress Notes (Addendum)
Physical Therapy Treatment Patient Details Name: Haley Graham MRN: [id number] DOB: December 01, 1955 Today's Date: 07/06/2016    History of Present Illness Pt adm with type B aortic dissection. Treated non surgically with BP control and pain control. Pt developed pancreatitis and ileus with NG tube placed. PMH - anxiety, arthritis    PT Comments    Pt admitted with above diagnosis. Pt currently with functional limitations due to balance and endurance deficits. Pt was able to ambulate with min guard assist with RW with chair follow with 1 sitting rest break.  Husband present and attentive to pt.  Pt needs incr encouragement. Was nauseated and vomiting a little during session with nurse made aware.  Vitals were 99 bpm to 138 bpm with activity; 99% O2 at rest on RA and 91% with ambulation on RA.  BP 149/63.  Will continue acute PT.  Pt will benefit from skilled PT to increase their independence and safety with mobility to allow discharge to the venue listed below.     Follow Up Recommendations  Home health PT;Supervision/Assistance - 24 hour     Equipment Recommendations  Rolling walker with 5" wheels    Recommendations for Other Services       Precautions / Restrictions Precautions Precautions: Fall Restrictions Weight Bearing Restrictions: No    Mobility  Bed Mobility               General bed mobility comments: in chair on arrrival  Transfers Overall transfer level: Needs assistance Equipment used: Rolling walker (2 wheeled) Transfers: Sit to/from Stand Sit to Stand: Min guard;Supervision         General transfer comment: Verbal cues for hand placement.   Ambulation/Gait Ambulation/Gait assistance: Min guard;+2 safety/equipment (chair follow) Ambulation Distance (Feet): 210 Feet (105 x 2) Assistive device: Rolling walker (2 wheeled) Gait Pattern/deviations: Step-through pattern;Decreased step length - right;Decreased step length - left Gait velocity: decr Gait  velocity interpretation: Below normal speed for age/gender General Gait Details: Assist for balance and support.  Brief sitting rest break between bouts. HR did incr from 99 bpm to 138 bpm.  SpO2 91%-92%.   After second rest break, pt stated she was anxious and could not go further.  Pt vomiting a little fluid as well and already had nausea meds.  Nurse aware.   Stairs            Wheelchair Mobility    Modified Rankin (Stroke Patients Only)       Balance Overall balance assessment: Needs assistance         Standing balance support: Bilateral upper extremity supported Standing balance-Leahy Scale: Poor Standing balance comment: walker and min guard assist for static standing                            Cognition Arousal/Alertness: Awake/alert Behavior During Therapy: Flat affect Overall Cognitive Status: Impaired/Different from baseline Area of Impairment: Following commands;Attention;Problem solving                   Current Attention Level: Sustained   Following Commands: Follows one step commands with increased time Safety/Judgement: Decreased awareness of safety Awareness: Emergent Problem Solving: Slow processing;Decreased initiation;Requires verbal cues;Requires tactile cues        Exercises Total Joint Exercises Long Arc Quad: AROM;Both;5 reps;Seated General Exercises - Lower Extremity Ankle Circles/Pumps: AROM;10 reps;Both;Supine    General Comments        Pertinent Vitals/Pain Pain Assessment: Faces  Faces Pain Scale: Hurts even more Pain Location: abdomen Pain Descriptors / Indicators: Discomfort;Grimacing Pain Intervention(s): Limited activity within patient's tolerance;Monitored during session;Premedicated before session;Repositioned    Home Living                      Prior Function            PT Goals (current goals can now be found in the care plan section) Progress towards PT goals: Progressing toward  goals    Frequency    Min 3X/week      PT Plan Current plan remains appropriate    Co-evaluation              AM-PAC PT "6 Clicks" Daily Activity  Outcome Measure  Difficulty turning over in bed (including adjusting bedclothes, sheets and blankets)?: A Little Difficulty moving from lying on back to sitting on the side of the bed? : A Little Difficulty sitting down on and standing up from a chair with arms (e.g., wheelchair, bedside commode, etc,.)?: A Little Help needed moving to and from a bed to chair (including a wheelchair)?: A Little Help needed walking in hospital room?: A Little Help needed climbing 3-5 steps with a railing? : A Lot 6 Click Score: 17    End of Session Equipment Utilized During Treatment: Gait belt Activity Tolerance: Patient limited by fatigue (limited by nausea) Patient left: in chair;with call bell/phone within reach;with family/visitor present Nurse Communication: Other (comment);Mobility status (nausea/vomiting) PT Visit Diagnosis: Muscle weakness (generalized) (M62.81);Other abnormalities of gait and mobility (R26.89)     Time: 6789-3810 PT Time Calculation (min) (ACUTE ONLY): 26 min  Charges:  $Gait Training: 8-22 mins $Therapeutic Exercise: 8-22 mins                    G Codes:       [name],PT Acute Rehabilitation [phone] (pager)    [name] 07/06/2016, 10:23 AM

## 2016-07-06 NOTE — Progress Notes (Signed)
Patient ID: Haley Graham, female   DOB: 24-Dec-1955, 61 y.o.   MRN: [id number]  Rechecked patient this afternoon. Patient had multiple episodes of emesis this morning after taking in clears. States that she is feeling better now. She reports mild central abdominal pain. Passing flatus.  Abdomen mildly distended and minimally tender in central aspect of exam. +BS in LLQ.  Abdominal xray has been ordered and patient returned to NPO status.  BROOKE A MILLER

## 2016-07-06 NOTE — Progress Notes (Signed)
Lengthy conversation had at bedside with pt and husband regarding request for IV pain medication. Reinforced MD Hendrickson's comments with her from earlier regarding narcotics making her condition worse in the long run. Pt very adamant that she still is in pain and is wanting pain medication. IV hydromorphone given x1. Will continue to monitor and educate.  Eleonore Chiquito RN 2 Heart

## 2016-07-06 NOTE — Progress Notes (Signed)
Subjective: Interval History: none.. Still having some liquid bowel movements. Had been taking liquids but then had nausea and vomiting of bilious fluids again. No blood  Objective: Vital signs in last 24 hours: Temp:  [97.6 F (36.4 C)-98.9 F (37.2 C)] 98.1 F (36.7 C) (06/04 1529) Pulse Rate:  [81-105] 89 (06/04 1500) Resp:  [11-26] 24 (06/04 1500) BP: (96-149)/(45-65) 119/55 (06/04 1500) SpO2:  [90 %-100 %] 93 % (06/04 1500) Weight:  [177 lb 7.5 oz (80.5 kg)] 177 lb 7.5 oz (80.5 kg) (06/04 0500)  Intake/Output from previous day: 06/03 0701 - 06/04 0700 In: 3069.3 [I.V.:2439.3; NG/GT:30; IV Piggyback:600] Out: 2887 [Urine:2135; Emesis/NG output:750; Stool:2] Intake/Output this shift: Total I/O In: 897 [I.V.:747; IV Piggyback:150] Out: 950 [Urine:750; Emesis/NG output:200]  Abdomen soft and completely nontender. Marked improvement since she is been here.  Lab Results:  Recent Labs  07/04/16 0440 07/06/16 0411  WBC 10.7* 14.0*  HGB 10.1* 10.1*  HCT 32.6* 32.2*  PLT 300 317   BMET  Recent Labs  07/05/16 1259 07/06/16 0411  NA 138 137  K 2.8* 3.6  CL 102 102  CO2 24 26  GLUCOSE 182* 179*  BUN 52* 48*  CREATININE 1.11* 1.21*  CALCIUM 8.6* 8.5*    Studies/Results: Ct Abdomen Pelvis Wo Contrast  Result Date: 06/26/2016 CLINICAL DATA:  Shortness of breath EXAM: CT CHEST, ABDOMEN AND PELVIS WITHOUT CONTRAST TECHNIQUE: Multidetector CT imaging of the chest, abdomen and pelvis was performed following the standard protocol without IV contrast. COMPARISON:  06/20/2016 chest CT FINDINGS: CT CHEST FINDINGS Cardiovascular: Normal heart size. No pericardial effusion. 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 Mild haziness around the upper descending segment is likely stable. Stable maximal diameter of 33 mm at this level. No intramural hematoma. Intermittently seen displaced intimal calcification. Mediastinum/Nodes: Negative for adenopathy. Right upper  extremity PICC with tip at the SVC level. Lungs/Pleura: Multi segment atelectasis, worse on the left. Small pleural effusions. There is patchy ground-glass airspace density in the bilateral lungs without gradient. Airways are clear and there is no septal thickening. Musculoskeletal: No acute or aggressive finding. CT ABDOMEN PELVIS FINDINGS Hepatobiliary: Hepatic steatosis.Cholecystectomy with normal common bile duct diameter. Pancreas: Unremarkable. Spleen: Unremarkable. 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 The larger measures up to 27 mm and is indeterminate by densitometry, but left adrenal mass has been noted since at least 2006 abdominal CT report, images not available. Left renal cyst. No hydronephrosis or urolithiasis. 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 Negative decompressed urinary bladder. Stomach/Bowel: There is diffuse small bowel distention and fluid filling with mildly hazy mesenteries. Similar features in the colon proximally and at the transverse segment. No pneumatosis or perforation is noted. Nasogastric tube tip is at the pylorus. Vascular/Lymphatic: There is intimal flap displacement from known dissection, morphology appearing similar to prior. No mass or adenopathy. Reproductive:Hysterectomy.  Unremarkable ovaries. Other: No ascites or pneumoperitoneum.  Anasarca. Musculoskeletal: No acute abnormalities. These results were called by telephone at the time of interpretation on 06/26/2016 at 2:05 pm to Dr. Ivin Poot , who verbally acknowledged these results. IMPRESSION: 1. Diffuse airspace disease. Pattern can be seen with noncardiogenic edema (ARDS), inflammatory pneumonitis, or atypical infection. Multi segment atelectasis at the bases and small pleural effusions. 2. Diffuse small bowel and colonic distention with fluid levels as seen with ileus. Question underlying bowel ischemia  given the patient's known aortic  dissection with proximal IMA occlusion and SMA flow via the narrow true lumen. 3. Incidental findings noted above. Electronically Signed   By: Monte Fantasia M.D.   On: 06/26/2016 14:05   Ct Chest Wo Contrast  Result Date: 06/26/2016 CLINICAL DATA:  Shortness of breath EXAM: CT CHEST, ABDOMEN AND PELVIS WITHOUT CONTRAST TECHNIQUE: Multidetector CT imaging of the chest, abdomen and pelvis was performed following the standard protocol without IV contrast. COMPARISON:  06/20/2016 chest CT FINDINGS: CT CHEST FINDINGS Cardiovascular: Normal heart size. No pericardial effusion. 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 Mild haziness around the upper descending segment is likely stable. Stable maximal diameter of 33 mm at this level. No intramural hematoma. Intermittently seen displaced intimal calcification. Mediastinum/Nodes: Negative for adenopathy. Right upper extremity PICC with tip at the SVC level. Lungs/Pleura: Multi segment atelectasis, worse on the left. Small pleural effusions. There is patchy ground-glass airspace density in the bilateral lungs without gradient. Airways are clear and there is no septal thickening. Musculoskeletal: No acute or aggressive finding. CT ABDOMEN PELVIS FINDINGS Hepatobiliary: Hepatic steatosis.Cholecystectomy with normal common bile duct diameter. Pancreas: Unremarkable. Spleen: Unremarkable. 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 The larger measures up to 27 mm and is indeterminate by densitometry, but left adrenal mass has been noted since at least 2006 abdominal CT report, images not available. Left renal cyst. No hydronephrosis or urolithiasis. 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 Negative decompressed urinary bladder. Stomach/Bowel: There is diffuse small bowel distention and fluid filling with mildly hazy  mesenteries. Similar features in the colon proximally and at the transverse segment. No pneumatosis or perforation is noted. Nasogastric tube tip is at the pylorus. Vascular/Lymphatic: There is intimal flap displacement from known dissection, morphology appearing similar to prior. No mass or adenopathy. Reproductive:Hysterectomy.  Unremarkable ovaries. Other: No ascites or pneumoperitoneum.  Anasarca. Musculoskeletal: No acute abnormalities. These results were called by telephone at the time of interpretation on 06/26/2016 at 2:05 pm to Dr. Ivin Poot , who verbally acknowledged these results. IMPRESSION: 1. Diffuse airspace disease. Pattern can be seen with noncardiogenic edema (ARDS), inflammatory pneumonitis, or atypical infection. Multi segment atelectasis at the bases and small pleural effusions. 2. Diffuse small bowel and colonic distention with fluid levels as seen with ileus. Question underlying bowel ischemia given the patient's known aortic dissection with proximal IMA occlusion and SMA flow via the narrow true lumen. 3. Incidental findings noted above. Electronically Signed   By: Monte Fantasia M.D.   On: 06/26/2016 14:05   Dg Chest Port 1 View  Result Date: 07/04/2016 CLINICAL DATA:  Followup shortness of breath.  Aortic dissection. EXAM: PORTABLE CHEST 1 VIEW COMPARISON:  07/01/2016 FINDINGS: Nasogastric tube enters the stomach. Right arm PICC tip is in the SVC above right atrium. Less pulmonary edema. Persistent left effusion. Persistent volume loss in both lower lobes left worse than right. Chronic prominence of the aortic shadow consistent with the history of dissection. IMPRESSION: Less pulmonary edema. Persistent left effusion. Persistent atelectasis in both lower lobes left more than right. Persistent prominent aortic/mediastinal shadow. Electronically Signed   By: Nelson Chimes M.D.   On: 07/04/2016 08:11   Dg Chest Port 1 View  Result Date: 07/01/2016 CLINICAL DATA:  Shortness of  breath common pneumonia, thoracic aortic dissection EXAM: PORTABLE CHEST 1 VIEW COMPARISON:  Portable chest x-ray of Jun 30, 2016 FINDINGS: The lungs are adequately  inflated. The pulmonary interstitial markings are less prominent. The retrocardiac region remains dense and there is remains partial obscuration of the left hemidiaphragm. The heart is top-normal in size. The mediastinum is normal in width. The esophagogastric tube tip projects below the inferior margin of the image. The right PICC line tip projects over the midportion of the SVC. IMPRESSION: Slight interval improvement in the appearance of the pulmonary interstitium suggests decreasing interstitial edema. There is persistent left lower lobe atelectasis or pneumonia. Electronically Signed   By: David  Martinique M.D.   On: 07/01/2016 07:35   Dg Chest Port 1 View  Result Date: 06/30/2016 CLINICAL DATA:  Breath, pneumonia, aortic aneurysm, GERD, hypertension, diabetes mellitus EXAM: PORTABLE CHEST 1 VIEW COMPARISON:  Portable exam 0633 hours compared to 06/29/2016 FINDINGS: Nasogastric tube coiled in proximal stomach. RIGHT arm PICC line tip projects over SVC above cavoatrial junction. Upper normal heart size. Mediastinal contours normal. Mild pulmonary vascular congestion. Persistent diffuse BILATERAL infiltrates. Atelectasis versus consolidation LEFT lower lobe. No pneumothorax. IMPRESSION: Persistent mild diffuse interstitial infiltrates with atelectasis versus consolidation in LEFT lower lobe. Electronically Signed   By: Lavonia Dana M.D.   On: 06/30/2016 06:58   Dg Chest Port 1 View  Result Date: 06/29/2016 CLINICAL DATA:  Shortness of Breath EXAM: PORTABLE CHEST 1 VIEW COMPARISON:  06/28/2016 FINDINGS: Nasogastric catheter is noted coiled within the stomach. Right-sided PICC line is seen in the distal superior vena cava. Cardiac shadow is mildly enlarged but stable. Bilateral vascular congestion and edema is seen stable from prior exam. Some  atelectasis remains in the left retrocardiac region. IMPRESSION: No significant interval change from the prior exam. Electronically Signed   By: Inez Catalina M.D.   On: 06/29/2016 07:36   Dg Chest Port 1 View  Result Date: 06/28/2016 CLINICAL DATA:  Aortic dissection. EXAM: PORTABLE CHEST 1 VIEW COMPARISON:  06/27/2016. FINDINGS: The heart is enlarged. There is moderate vascular congestion. Prominence of the aortic contour reflecting the type B dissection. LEFT lower lobe atelectasis with effusion. IMPRESSION: Cardiomegaly. Aortic dissection. Vascular congestion. LEFT lower lobe atelectasis with effusion. Electronically Signed   By: Staci Righter M.D.   On: 06/28/2016 07:13   Dg Chest Port 1 View  Result Date: 06/27/2016 CLINICAL DATA:  ARDS. EXAM: PORTABLE CHEST 1 VIEW COMPARISON:  Jun 26, 2016 FINDINGS: The right PICC line is stable. The NG tube terminates below today's study. Diffuse bilateral primarily interstitial opacities are mildly more homogeneous in less patchy in appearance. More focal opacity in the left base is stable. No other changes. IMPRESSION: 1. Diffuse bilateral primarily interstitial opacities suggesting edema remain. More focal opacity in the left base is stable to mildly improved. Electronically Signed   By: Dorise Bullion III M.D   On: 06/27/2016 07:21   Dg Chest Port 1 View  Result Date: 06/26/2016 CLINICAL DATA:  Shortness of breath. EXAM: PORTABLE CHEST 1 VIEW COMPARISON:  Radiograph of Jun 25, 2016. FINDINGS: Stable cardiomegaly. Increased bilateral diffuse interstitial and basilar opacities are noted concerning for worsening edema. No pneumothorax is noted. Mild left pleural effusion is noted with associated atelectasis or infiltrate. Right-sided PICC line is unchanged in position. Nasogastric tube is unchanged in position. Bony thorax is unremarkable. IMPRESSION: Increased bilateral diffuse interstitial densities consistent with edema. Mild left pleural effusion is noted  with associated atelectasis or infiltrate. Electronically Signed   By: Marijo Conception, M.D.   On: 06/26/2016 07:40   Dg Chest Port 1 View  Result Date: 06/25/2016 CLINICAL  DATA:  Shortness of Breath EXAM: PORTABLE CHEST 1 VIEW COMPARISON:  06/24/2016 FINDINGS: Cardiac shadow is prominent but stable. Right-sided PICC line and nasogastric catheter are again seen and stable. Bibasilar atelectatic changes are noted worse on the left than the right and increased from the prior exam. Small left pleural effusion is noted as well. Mild vascular congestion is seen. IMPRESSION: Increasing bibasilar infiltrates left greater than right. Mild vascular congestion is noted. The Electronically Signed   By: Inez Catalina M.D.   On: 06/25/2016 07:54   Dg Chest Port 1 View  Result Date: 06/24/2016 CLINICAL DATA:  Shortness of Breath EXAM: PORTABLE CHEST 1 VIEW COMPARISON:  06/23/2016 FINDINGS: Right-sided PICC line is again noted at the cavoatrial junction. Nasogastric catheter is noted in satisfactory position through the overall inspiratory effort is decreased from the prior exam. Mild left basilar atelectasis remains. No other focal abnormality is noted. IMPRESSION: Stable left basilar atelectasis. Electronically Signed   By: Inez Catalina M.D.   On: 06/24/2016 08:07   Dg Chest Port 1 View  Result Date: 06/23/2016 CLINICAL DATA:  61 year old female with shortness of breath and abdominal pain. 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 Being treated conservatively at this time, including with bowel rest. EXAM: PORTABLE CHEST 1 VIEW COMPARISON:  Chest radiographs 06/22/2016 and earlier. CTA 06/20/2016 FINDINGS: Portable AP semi upright view at 0754 hours. Stable enteric tube which courses to the abdomen. Stable right PICC line. Mildly larger lung volumes and decreased streaky bibasilar opacity. Mild residual. No  pneumothorax or pulmonary edema. No pleural effusion or consolidation. Stable cardiac size and mediastinal contours. IMPRESSION: 1.  Stable lines and tubes. 2. Mildly improved lung volumes and regressed bibasilar opacity favored to be atelectasis. Electronically Signed   By: Genevie Ann M.D.   On: 06/23/2016 08:19   Dg Chest Port 1 View  Result Date: 06/22/2016 CLINICAL DATA:  61 year old female with history of abdominal aortic aneurysm. Shortness of breath and vomiting. EXAM: PORTABLE CHEST 1 VIEW COMPARISON:  Chest x-ray 06/21/2016. FINDINGS: There is a right upper extremity PICC with tip terminating in the superior cavoatrial junction. A nasogastric tube is seen extending into the stomach, however, the tip of the nasogastric tube extends below the lower margin of the image. Lung volumes are low. Linear bibasilar opacities favored to reflect areas of subsegmental atelectasis, however, underlying airspace consolidation from infection or aspiration is not excluded. No pleural effusions. No evidence of pulmonary edema. Heart size is normal. Upper mediastinal contours are within normal limits. IMPRESSION: 1. Support apparatus, as above. 2. Low lung volumes with bibasilar areas of atelectasis and/or airspace consolidation. Electronically Signed   By: Vinnie Langton M.D.   On: 06/22/2016 07:30   Dg Chest Port 1 View  Result Date: 06/21/2016 CLINICAL DATA:  Aortic dissection EXAM: PORTABLE CHEST 1 VIEW COMPARISON:  06/20/2016 FINDINGS: Bibasilar atelectasis. Heart is normal size. No effusions or acute bony abnormality. IMPRESSION: Bibasilar atelectasis. Electronically Signed   By: Rolm Baptise M.D.   On: 06/21/2016 07:18   Dg Chest Port 1 View  Result Date: 06/20/2016 CLINICAL DATA:  Hypoxia, unresponsive EXAM: PORTABLE CHEST 1 VIEW COMPARISON:  CTA chest dated 06/20/2016 FINDINGS: Lungs are essentially clear. No focal consolidation. No pleural effusion or pneumothorax. The heart is top-normal in size.  IMPRESSION: No evidence of acute cardiopulmonary disease. Electronically Signed   By: Julian Hy M.D.   On: 06/20/2016  17:01   Dg Chest Port 1 View  Result Date: 06/20/2016 CLINICAL DATA:  Sudden onset sharp substernal chest pain that radiates to the back. Nausea, dizziness and shortness of breath. EXAM: PORTABLE CHEST 1 VIEW COMPARISON:  None. FINDINGS: Trachea is midline. Heart size is accentuated by AP supine technique. Probable mild scarring in the right middle lobe and lingula. Lungs are otherwise clear. No pleural fluid. IMPRESSION: No acute findings. Electronically Signed   By: Lorin Picket M.D.   On: 06/20/2016 09:56   Dg Abd Portable 1v  Result Date: 07/04/2016 CLINICAL DATA:  Aortic dissection. EXAM: PORTABLE ABDOMEN - 1 VIEW COMPARISON:  07/02/2016 FINDINGS: Nasogastric tube tip in the region of the antrum/pylorus. Persistent group of dilated small bowel loops in the left mid abdomen with possible bowel edema. Possibility of bowel ischemia certainly does exist. Similar appearance to the study of 07/02/2016. IMPRESSION: Nasogastric tube in place. Otherwise similar appearance with abnormal small bowel pattern in the left central abdomen which could go along with partial small bowel obstruction or a focal enteritis including ischemic. Electronically Signed   By: Nelson Chimes M.D.   On: 07/04/2016 08:13   Dg Abd Portable 1v  Result Date: 07/02/2016 CLINICAL DATA:  Nasogastric tube removed.  Acute abdominal pain. EXAM: PORTABLE ABDOMEN - 1 VIEW COMPARISON:  06/27/2016 FINDINGS: Nasogastric tube is been removed. There is a group of dilated fluid and air-filled loops of small intestine in the left abdomen with apparent wall thickening. This could be due to partial obstruction or enteritis, including ischemic bowel insult. IMPRESSION: Nasogastric tube removed. Abnormal dilated small intestine in the left central abdomen. Differential diagnosis partial small bowel obstruction versus enteritis,  including ischemic. Electronically Signed   By: Nelson Chimes M.D.   On: 07/02/2016 07:37   Dg Abd Portable 1v  Result Date: 06/27/2016 CLINICAL DATA:  Ileus. EXAM: PORTABLE ABDOMEN - 1 VIEW COMPARISON:  Jun 26, 2016 FINDINGS: The NG tube terminates within the stomach. The bowel gas pattern is unremarkable on today's study. No other acute abnormalities. IMPRESSION: The NG tube terminates within the stomach. The bowel gas pattern is unremarkable. Electronically Signed   By: Dorise Bullion III M.D   On: 06/27/2016 07:19   Dg Abd Portable 1v  Result Date: 06/26/2016 CLINICAL DATA:  Shortness of breath, abdominal pain EXAM: PORTABLE ABDOMEN - 1 VIEW COMPARISON:  06/25/2016 FINDINGS: NG tube tip is in the distal stomach. Prior cholecystectomy. Nonobstructive bowel gas pattern. No free air organomegaly. IMPRESSION: NG tube tip in the distal stomach.  No acute findings. Electronically Signed   By: Rolm Baptise M.D.   On: 06/26/2016 07:36   Dg Abd Portable 1v  Result Date: 06/25/2016 CLINICAL DATA:  Shortness of breath.  Abdominal pain. EXAM: PORTABLE ABDOMEN - 1 VIEW COMPARISON:  Jun 24, 2016 FINDINGS: The distal tip of the NG tube is near the gastric antrum. Mild opacity in left lung base will be better assessed on today's chest x-ray. No free air or portal venous gas identified although evaluation is limited on supine imaging. No bowel obstruction. IMPRESSION: No interval change or acute abnormality in the abdomen. Electronically Signed   By: Dorise Bullion III M.D   On: 06/25/2016 07:54   Dg Abd Portable 1v  Result Date: 06/24/2016 CLINICAL DATA:  Abdominal distention. EXAM: PORTABLE ABDOMEN - 1 VIEW COMPARISON:  06/23/2016 . FINDINGS: NG tube noted in stable position with tip in the stomach . Surgical clips right upper quadrant. Several nonspecific loops of air-filled  small bowel again noted. Colonic gas pattern is normal. No free air. IMPRESSION: 1.  NG tube in stable position. 2. Several nonspecific  loops of air-filled small bowel again noted. No evidence of progressive bowel distention. Colonic gas pattern normal. No free air. Electronically Signed   By: Marcello Moores  Register   On: 06/24/2016 08:06   Dg Abd Portable 1v  Result Date: 06/23/2016 CLINICAL DATA:  61 year old female with shortness of breath and abdominal pain. 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 Being treated conservatively at this time, including with bowel rest. EXAM: PORTABLE ABDOMEN - 1 VIEW COMPARISON:  Abdominal radiographs 06/22/2016. CTA 06/20/2016, and earlier. FINDINGS: Portable AP supine view at 0759 hours. Stable NG tube, side hole to level of the gastric body and tip at the level of the gastric antrum. Stable cholecystectomy clips. Stable bowel gas pattern with several gas containing nondilated small and large bowel loops. No definite pneumoperitoneum on this supine view. Abdominal and pelvic visceral contours appear stable. No acute osseous abnormality identified. IMPRESSION: 1. Stable NG tube position. 2. Stable, nonobstructed bowel-gas pattern. Electronically Signed   By: Genevie Ann M.D.   On: 06/23/2016 08:18   Dg Abd Portable 1v  Result Date: 06/22/2016 CLINICAL DATA:  Abdominal pain for few days with nausea and vomiting. EXAM: PORTABLE ABDOMEN - 1 VIEW COMPARISON:  06/22/2016 abdominal radiograph. FINDINGS: Enteric tube loops in the gastric fundus and terminates in the body of the stomach, without appreciable kink. Top-normal caliber small bowel loops throughout the abdomen. Minimal colonic stool. No evidence of pneumatosis or pneumoperitoneum. No radiopaque urolithiasis. Cholecystectomy clips are seen in the right upper quadrant of the abdomen. Patchy opacities at the lung bases. IMPRESSION: 1. Enteric tube loops in the gastric fundus and terminates in the body of the stomach without appreciable kink. 2.  Top-normal caliber small bowel loops, unchanged. 3. Patchy bibasilar lung opacities, correlate with chest radiograph. Electronically Signed   By: Ilona Sorrel M.D.   On: 06/22/2016 16:58   Dg Abd Portable 1v  Result Date: 06/22/2016 CLINICAL DATA:  Vomiting.  Shortness of breath.  AAA . EXAM: PORTABLE ABDOMEN - 1 VIEW COMPARISON:  06/21/2016. FINDINGS: Surgical clips right upper quadrant. NG tube noted with tip over the distal stomach. No bowel distention. No acute bony abnormality identified. Mild basilar atelectasis. IMPRESSION: NG tube noted with its tip over the distal stomach. No bowel distention or acute intra-abdominal abnormality identified. Electronically Signed   By: Marcello Moores  Register   On: 06/22/2016 07:30   Dg Abd Portable 1v  Result Date: 06/21/2016 CLINICAL DATA:  Feeding tube placement EXAM: PORTABLE ABDOMEN - 1 VIEW COMPARISON:  06/21/2016 FINDINGS: NG tube coils in the fundus of the stomach with the tip in the distal stomach. Decompression of the stomach. IMPRESSION: NG tube tip in the distal stomach. Electronically Signed   By: Rolm Baptise M.D.   On: 06/21/2016 15:46   Dg Abd Portable 1v  Result Date: 06/21/2016 CLINICAL DATA:  Ileus  Vomiting that started this AM per patient EXAM: PORTABLE ABDOMEN - 1 VIEW COMPARISON:  06/20/2016 FINDINGS: Gaseous distention of the stomach. Paucity of small bowel and colonic gas. Cholecystectomy clips. Linear scarring/ atelectasis in the lung bases. IMPRESSION: 1. Gaseous distention of the stomach. Electronically Signed   By: Lucrezia Europe M.D.   On: 06/21/2016 11:54   Dg Abd Portable 1v  Result Date: 06/20/2016 CLINICAL DATA:  Aortic  dissection EXAM: PORTABLE ABDOMEN - 1 VIEW COMPARISON:  CT abdomen/ pelvis dated 06/20/2016 FINDINGS: Nonobstructive bowel gas pattern. Visualized osseous structures are within normal limits. IMPRESSION: Unremarkable abdominal radiograph. Electronically Signed   By: Julian Hy M.D.   On: 06/20/2016 17:02   Ct  Angio Chest/abd/pel For Dissection W And/or W/wo  Result Date: 06/27/2016 CLINICAL DATA:  61 year old female with a history of type B dissection EXAM: CT ANGIOGRAPHY CHEST, ABDOMEN AND PELVIS TECHNIQUE: Multidetector CT imaging through the chest, abdomen and pelvis was performed using the standard protocol during bolus administration of intravenous contrast. Multiplanar reconstructed images and MIPs were obtained and reviewed to evaluate the vascular anatomy. CONTRAST:  100 cc Isovue 370 COMPARISON:  CT 06/20/2016 FINDINGS: CTA CHEST FINDINGS Cardiovascular: Heart: Heart size unchanged. No pericardial fluid/ thickening. No significant coronary calcifications. Aorta: Re- demonstration of type B dissection with the entry tear appearing to originate just beyond the origin of the left subclavian artery. Branch vessels remain patent without extension of the dissection flap into the branch vessels. Caliber and contour of the ascending aorta unremarkable without evidence of retrograde extension. Greatest diameter of the ascending aorta 2.9 cm. Inflammatory changes surrounding the distal aortic arch in the proximal descending aorta. Greatest diameter of the distal aortic arch measures approximately 3.4 cm. Greatest diameter on the comparison CT approximately 3.0 cm. No aneurysm of the descending thoracic aorta with the greatest diameter of the false lumen measuring 2.7 cm. True lumen is compressed. There is a fenestration in the distal true lumen at the level of the diaphragm just above the aortic hiatus. Pulmonary arteries: No lobar, segmental, or proximal subsegmental filling defects. Mediastinum/Nodes: No mediastinal hemorrhage. Mediastinal lymph nodes are present. Gastric tube within the esophagus. Lungs/Pleura: Ground-glass opacities developing through the the bilateral lungs. Developing interlobular septal thickening. Atelectasis of the medial segment left lower lobe. Small low-density left pleural effusion trace  right-sided pleural effusion and associated atelectasis. No pneumothorax. Right upper extremity PICC appears to terminate superior vena cava. Review of the MIP images confirms the above findings. CTA ABDOMEN AND PELVIS FINDINGS VASCULAR Aorta: Re- demonstration of dissection flap of the abdominal aorta. No aneurysm.  No periaortic fluid of the abdomen. The true lumen is compressed throughout the abdomen. True lumen contributes to celiac artery origin, superior mesenteric artery origin, and also continues across the origin of the inferior mesenteric artery. The false lumen contributes to perfusion of the right renal artery. The lateral margin of the dissection flap involves the origin of 2 left renal arteries both superior and inferior. True lumen is decompressed in the inferior aorta extending into the bilateral iliac arteries. The bilateral common iliac arteries appear perfused from the false lumen bilaterally. False lumen extends in the bilateral external iliac artery. Likely re- entry tear of distal external iliac arteries bilaterally, on the right image 173, on the left image 171. Dissection flap extends into the bilateral hypogastric arteries which are partially patent with decreased flow. Celiac: Origin of the celiac artery originates from the true lumen which is decompressed. Celiac artery remains patent at this time including the branch vessels. Typical branch pattern of splenic artery, left gastric artery, common hepatic artery. SMA: Superior mesenteric artery origin originates from the true lumen which is compressed. SMA remains patent at this time. Renals: Right renal artery originates from the false lumen. Right renal artery is perfusing at this time with uniform perfusion of the right kidney. There are 2 left renal arteries, superior and inferior. Both arteries originate near  the margin of the dissection flap. The superior left renal artery is partially filling, at the in flexion point of the dissection  flap, perfusing superior kidney. The inferior left renal artery appears thrombosed, new from the comparison with enlarging left renal infarction, predominantly of the lower pole. IMA: Origin of the inferior mesenteric artery is thrombosed given the collapsed true lumen at this level. There is re- constitution of the distal inferior mesenteric artery via collateral flow. Right lower extremity: False lumen perfuses the right common iliac artery with collapse of the true lumen. Dissection flap extends into the hypogastric artery, with the distal pelvic branch is partially opacifying. External iliac artery is perfusing from the false lumen, with unremarkable appearance of the distal external iliac artery, common femoral artery, profunda femoris, SFA. There is the appearance of a re- entry tear in the mid right external iliac artery. Left lower extremity: False lumen perfuses the left common iliac artery with collapse of the true lumen. Dissection extends into the hypogastric artery which is partially perfused with opacification of pelvic vessels. Distal external iliac artery an the common femoral artery unremarkable, with patent proximal femoral vessels. There is the appearance of a re- entry tear in the mid left external iliac artery. Veins: Unremarkable appearance of the venous system. Review of the MIP images confirms the above findings. NON-VASCULAR Hepatobiliary: Unremarkable appearance of the liver. Cholecystectomy Pancreas: Unremarkable appearance of the pancreas. No pericholecystic fluid or inflammatory changes. Unremarkable ductal system. Spleen: Unremarkable. Adrenals/Urinary Tract: Right adrenal gland unremarkable. Nodule of the left adrenal gland which measures 2.7 cm. Right: Right-sided kidney perfuses uniformly with no hydronephrosis. Left: Progressive left renal infarct with small portion of the superior kidney perfusing. The remainder of the left kidney is hypoperfused, progressed from the comparison. No  hydronephrosis. Urinary catheter within the urinary bladder. Stomach/Bowel: Unremarkable appearance of stomach with gastric tube in place. Small bowel borderline dilated and fluid-filled. No transition point. The wall of the small bowel relatively uniformly thin and enhancing, although the study is timed for the arterial phase. No focal wall thickening. No abnormally distended colon. No transition point. Fluid filled colon. No focal wall thickening or pericolonic inflammatory changes. Lymphatic: Multiple lymph nodes in the para-aortic nodal station, none of which are enlarged. Mesenteric: No free fluid or air. No adenopathy. Reproductive: Hysterectomy Other: No hernia. Musculoskeletal: No displaced fracture. Degenerative changes of the spine. 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 Only 1 possible fenestration is identified, in the distal thoracic aorta above the aortic hiatus. There is enlarging diameter of the distal aortic arch with associated inflammatory changes, measuring approximately 3.5 on today's study compared to approximately 3.1 cm previously. The appearance is concerning for progression/expansion of intramural hematoma. Progressing left renal infarction, with near complete thrombosis of superior and inferior renal arteries, both of which originate at the margin of the dissection flap. Bilateral common iliac arteries appear to be perfused from the false lumen, with complete collapse of true lumen proximally. There does appear to be re- entry to the true lumen in the mid external iliac artery bilaterally, as there is unremarkable appearance of the proximal femoral vasculature including bilateral common iliac arteries. The 3 mesenteric vessels originate from the small true lumen, which is progressively compressed. Celiac artery and superior mesenteric artery remain patent, with occlusion at the origin of the inferior  mesenteric artery. Three vessel arch, with all 3 branches remain patent. No evidence of extension of the  dissection flap into the branch vessels. These preliminary results were discussed by telephone at the time of interpretation on 06/27/2016 at 12:41 pm with Dr. Curt Jews. Borderline dilated small bowel without transition point or obstruction. Findings may represent ileus and/or nonspecific enteritis. No focal wall thickening to suggest Ashvin Adelson ischemia, although the timing of this CTA is specific for arterial evaluation and not the bowel tissues. If ongoing concern for bowel ischemia, would repeat abd/pelvis contrast enhanced-CT portal venous phase. Similar appearance of mixed geographic ground-glass opacities of the bilateral lungs with mild interlobular septal thickening. Again, differential diagnosis includes pulmonary edema, ARDS, atypical infection. Signed, Dulcy Fanny. Earleen Newport, DO [facility] Radiology Electronically Signed   By: Corrie Mckusick D.O.   On: 06/27/2016 13:18   Ct Angio Chest/abd/pel For Dissection W And/or W/wo  Result Date: 06/20/2016 CLINICAL DATA:  Substernal chest pain radiating to back EXAM: CT ANGIOGRAPHY CHEST, ABDOMEN AND PELVIS TECHNIQUE: Multidetector CT imaging through the chest, abdomen and pelvis was performed using the standard protocol during bolus administration of intravenous contrast. Multiplanar reconstructed images and MIPs were obtained and reviewed to evaluate the vascular anatomy. CONTRAST:  100 cc Isovue 370 IV COMPARISON:  CT abdomen and pelvis 10/27/2011 FINDINGS: CTA CHEST FINDINGS Cardiovascular: There is the type B aortic dissection beginning in the distal aortic arch just beyond the origin of the great vessels. The true lumen is compressed by the false lumen. No aneurysm. No pulmonary embolus. Heart is normal size. Mediastinum/Nodes: No mediastinal, hilar, or axillary adenopathy. Lungs/Pleura: Atelectasis or scarring in  the lingula. Lungs otherwise clear. No effusions. Musculoskeletal: No acute bony abnormality. Review of the MIP images confirms the above findings. CTA ABDOMEN AND PELVIS FINDINGS VASCULAR Aorta: Dissection continues throughout the abdominal aorta with the celiac artery and superior mesenteric artery arising from the true lumen anteriorly. The dissection may extend into the left renal artery where there is only a small amount of blood flow noted in the proximal and mid left renal artery. Right renal artery appears to arise from the false lumen and is patent. The the true lumen appears thrombosed below the origin of the superior mesenteric artery. Inferior mesenteric artery arises from the thrombosed true lumen and appears occluded proximally, reconstitutes several cm from the origin. Celiac: Patent. SMA: Patent. Renals: As above. IMA: As above. Inflow: To the dissection continues into both common iliac arteries with thrombosed true lumens. The dissection appears to terminate at approximately the level of the common iliac bifurcation bilaterally. Veins: Grossly patent and unremarkable. Review of the MIP images confirms the above findings. NON-VASCULAR Hepatobiliary: Mild diffuse fatty infiltration. Prior cholecystectomy. Pancreas: No focal abnormality or ductal dilatation. Spleen: No focal abnormality.  Normal size. Adrenals/Urinary Tract: Nodules in the left adrenal gland are low-density on the precontrast imaging compatible with small adenomas. There are areas of non perfusion noted in the mid and lower poles of the left kidney compatible with infarction, likely related to the involvement of the left renal artery by dissection. Urinary bladder unremarkable. Stomach/Bowel: Stomach, large and small bowel grossly unremarkable. Lymphatic: No adenopathy. Reproductive: Prior hysterectomy.  No adnexal masses. Other: No free fluid or free air. Musculoskeletal: No acute bony abnormality. Review of the MIP images confirms  the above findings. 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 This involves the descending thoracic aorta, abdominal aorta and iliac vessels. The true lumen is compressed by the larger false lumen, and is  thrombosed below the SMA/renal artery origins. The dissection appears to extend into the left renal artery with partial occlusion and areas of infarct in the mid and lower pole of the left kidney. Fatty infiltration of the liver. Critical Value/emergent results were called by telephone at the time of interpretation on 06/20/2016 at 10:13 am to Dr. Fredia Sorrow , who verbally acknowledged these results. Electronically Signed   By: Rolm Baptise M.D.   On: 06/20/2016 10:15   Anti-infectives: Anti-infectives    Start     Dose/Rate Route Frequency Ordered Stop   06/26/16 0830  meropenem (MERREM) 1 g in sodium chloride 0.9 % 100 mL IVPB  Status:  Discontinued     1 g 200 mL/hr over 30 Minutes Intravenous Every 8 hours 06/26/16 0810 07/06/16 0804   06/22/16 1800  clindamycin (CLEOCIN) IVPB 600 mg  Status:  Discontinued     600 mg 100 mL/hr over 30 Minutes Intravenous Every 8 hours 06/22/16 1723 06/26/16 0810   06/22/16 1000  azithromycin (ZITHROMAX) 250 mg in dextrose 5 % 125 mL IVPB  Status:  Discontinued     250 mg 125 mL/hr over 60 Minutes Intravenous Every 24 hours 06/22/16 0826 06/27/16 1019   06/22/16 0900  levofloxacin (LEVAQUIN) IVPB 500 mg  Status:  Discontinued     500 mg 100 mL/hr over 60 Minutes Intravenous Every 24 hours 06/22/16 0759 06/22/16 0824      Assessment/Plan: s/p * No surgery found * Still very difficult management decisions. Continues ileus. Continued to have the TNA for nutrition. Abdominal exam is totally benign currently.   LOS: 16 days   EarlySherren Mocha 07/06/2016, 4:12 PM

## 2016-07-06 NOTE — Progress Notes (Signed)
Patient had clear liquid tray and became nauseous.  I gave IV zofran and patient vomited approx 122ml.  Notified Dr. Hulen Skains and he instructed her to be NPO.  Will continue to monitor.

## 2016-07-06 NOTE — Progress Notes (Signed)
  Subjective: Appears stronger, more motivated BP well controlled nsr Taking clear liquids, no emesis Last stol yesterday WBC ok [14k], will stop meropenum   Objective: Vital signs in last 24 hours: Temp:  [98.3 F (36.8 C)-98.9 F (37.2 C)] 98.9 F (37.2 C) (06/04 0400) Pulse Rate:  [78-105] 87 (06/04 0700) Cardiac Rhythm: Normal sinus rhythm (06/04 0400) Resp:  [11-26] 11 (06/04 0700) BP: (96-160)/(45-71) 125/63 (06/04 0806) SpO2:  [90 %-99 %] 98 % (06/04 0700) Weight:  [177 lb 7.5 oz (80.5 kg)] 177 lb 7.5 oz (80.5 kg) (06/04 0500)  Hemodynamic parameters for last 24 hours:    Intake/Output from previous day: 06/03 0701 - 06/04 0700 In: 3069.3 [I.V.:2439.3; NG/GT:30; IV Piggyback:600] Out: 2887 [Urine:2135; Emesis/NG output:750; Stool:2] Intake/Output this shift: No intake/output data recorded.  Abdomen distended, tinkles but non tender extrem warm with pedal pulses Lab Results:  Recent Labs  07/04/16 0440 07/06/16 0411  WBC 10.7* 14.0*  HGB 10.1* 10.1*  HCT 32.6* 32.2*  PLT 300 317   BMET:  Recent Labs  07/05/16 1259 07/06/16 0411  NA 138 137  K 2.8* 3.6  CL 102 102  CO2 24 26  GLUCOSE 182* 179*  BUN 52* 48*  CREATININE 1.11* 1.21*  CALCIUM 8.6* 8.5*    PT/INR: No results for input(s): LABPROT, INR in the last 72 hours. ABG    Component Value Date/Time   PHART 7.430 06/28/2016 1615   HCO3 20.7 06/28/2016 1615   TCO2 22 06/28/2016 1615   ACIDBASEDEF 3.0 (H) 06/28/2016 1615   O2SAT 93.0 06/28/2016 1615   CBG (last 3)   Recent Labs  07/05/16 2120 07/05/16 2348 07/06/16 0407  GLUCAP 163* 147* 174*    Assessment/Plan: S/P  Mobilize Diuresis Diabetes control clear liq per Gen Surg  Convert lopressor from iv to po Bid xanax for anxiety- stop iv ativan DC foley Transfer to stepdown tomorrow if she has a good day today  LOS: 16 days    [name] 07/06/2016

## 2016-07-06 NOTE — Progress Notes (Signed)
Patient refusing to ambulate or get into the chair.  I discussed the risks involved with staying in bed and she verbalized that she understood.  Dr. [name] at bedside new orders received and noted.  Will continue to monitor.

## 2016-07-06 NOTE — Progress Notes (Signed)
Notified Dr. Prescott Gum of continued bile emesis.  Agrees to keep pt NPO; will continue to monitor.

## 2016-07-06 NOTE — Progress Notes (Signed)
[facility] Progress Note    Subjective: Patient says she feels better and wants to eat.  Objective: Vital signs in last 24 hours: Temp:  [98 F (36.7 C)-98.9 F (37.2 C)] 98.9 F (37.2 C) (06/04 0400) Pulse Rate:  [77-105] 87 (06/04 0700) Resp:  [11-26] 11 (06/04 0700) BP: (96-160)/(45-71) 96/45 (06/04 0700) SpO2:  [90 %-99 %] 98 % (06/04 0700) Weight:  [80.5 kg (177 lb 7.5 oz)] 80.5 kg (177 lb 7.5 oz) (06/04 0500) Last BM Date: 07/05/16  Intake/Output from previous day: 06/03 0701 - 06/04 0700 In: 3069.3 [I.V.:2439.3; NG/GT:30; IV Piggyback:600] Out: 2887 [Urine:2135; Emesis/NG output:750; Stool:2] Intake/Output this shift: No intake/output data recorded.  General: No distress.   Lungs: clear to auscultation.  Abd: distended, hypoactive bowel sounds.  Reports having 3 bowel movements yesterday.  Extremities: No changes  Neuro: Intact  Lab Results:  @LABLAST2 (wbc:2,hgb:2,hct:2,plt:2) BMET ) Recent Labs  07/05/16 1259 07/06/16 0411  NA 138 137  K 2.8* 3.6  CL 102 102  CO2 24 26  GLUCOSE 182* 179*  BUN 52* 48*  CREATININE 1.11* 1.21*  CALCIUM 8.6* 8.5*   PT/INR No results for input(s): LABPROT, INR in the last 72 hours. ABG No results for input(s): PHART, HCO3 in the last 72 hours.  Invalid input(s): PCO2, PO2  Studies/Results: No results found.  Anti-infectives: Anti-infectives    Start     Dose/Rate Route Frequency Ordered Stop   06/26/16 0830  meropenem (MERREM) 1 g in sodium chloride 0.9 % 100 mL IVPB     1 g 200 mL/hr over 30 Minutes Intravenous Every 8 hours 06/26/16 0810     06/22/16 1800  clindamycin (CLEOCIN) IVPB 600 mg  Status:  Discontinued     600 mg 100 mL/hr over 30 Minutes Intravenous Every 8 hours 06/22/16 1723 06/26/16 0810   06/22/16 1000  azithromycin (ZITHROMAX) 250 mg in dextrose 5 % 125 mL IVPB  Status:  Discontinued     250 mg 125 mL/hr over 60 Minutes Intravenous Every 24 hours 06/22/16 0826 06/27/16 1019   06/22/16 0900   levofloxacin (LEVAQUIN) IVPB 500 mg  Status:  Discontinued     500 mg 100 mL/hr over 60 Minutes Intravenous Every 24 hours 06/22/16 0759 06/22/16 0824      Assessment/Plan: s/p  Advance diet  LOS: 16 days   Kathryne Eriksson. Dahlia Bailiff, MD, FACS [phone] [facility] Surgery 07/06/2016

## 2016-07-06 NOTE — Progress Notes (Signed)
      [address]       [place] [address]             [phone]      Patient c/o abdominal cramping and nausea- requesting narcotics for pain  BP 110/61   Pulse 89   Temp 98.1 F (36.7 C) (Oral)   Resp (!) 24   Ht 5\' 3"  (1.6 m)   Wt 177 lb 7.5 oz (80.5 kg)   SpO2 93%   BMI 31.44 kg/m   Intake/Output Summary (Last 24 hours) at 07/06/16 1824 Last data filed at 07/06/16 1500  Gross per 24 hour  Intake             2591 ml  Output             1685 ml  Net              906 ml   Abdomen distended, no peritoneal signs  She does not have an acute abdomen at present but is distended and clearly uncomfortable. Abdominal film shows an ileus.  I explained to her that narcotics might actually worsen the situation and encouraged her to avoid them as much as possible.   I will try reglan x 24 hours. It is listed under the allergy section as making her "heart race" but she has been on it previously and doesn't know of adverse effects. She certainly has not had anything resembling an anaphylactic reaction to it and had been on it for years for gastroparesis prior to stopping it because she did not feel like she needed it anymore.  [name] [name], MD [facility] [phone]

## 2016-07-06 NOTE — Progress Notes (Signed)
  PHARMACY - ADULT TOTAL PARENTERAL NUTRITION CONSULT NOTE   Pharmacy Consult for TPN Indication: prolonged NPO status/high NG output   Patient Measurements: Height: '5\' 3"'$  (160 cm) Weight: 177 lb 7.5 oz (80.5 kg) IBW/kg (Calculated) : 52.4 TPN AdjBW (KG): 59.4 Body mass index is 31.44 kg/m. 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 Patient had a significant hx of N/V for 2-4 weeks prior to admission. Weight appears to have been maintained. Patient had a NG tube placed 5/20 which had significant output with 1-1.5 L a day, removed NG tube on 5/30, however, patient with significant episodes of N&V and NG replaced 6/1 with continuous low suction.  Still with significant output - plan is to continue NG another 24 hours.   GI:  NG tube removed 5/30 - replaced 6/1 (NG output 1.7L 6/3). Moderate pain to abdomen but hasn't changed significantly.   Abdominal x-ray on 5/31 shows abnormal dilated small intestine - SBO vs. Enteritis.  Thinking ileus vs gastroparesis.  Prealbumin down to 9.0 and albumin down to 1.8.  BM x 2 6/3.  Feeling better & wants to eat- trying CLs again 6/4.   Phenergan Q 6 prn, bisacodyl, simethicone Endo: Hx of DM. No insulin at home. CBGs better after Lantus increase (147-182 this am).  No hypoglycemic events.  Insulin requirements in the past 24 hours: 28 units of aspart; 80 units of Lantus  Lytes:  K 3.7- s/p K runs x 6, Mg 1.9.   Lasix '40mg'$  IV qday- dose dec 6/4. Renal:AKI: Scr 1.21 stable  (Baseline < 1). UOP good. - 1.4 L yesterday Pulm: RA; Dulera; Brovana, Albuterol  Cards: Stanford type B aortic dissection; BP up today had been soft, HR 70s-90s in NSR. Metoprolol IV, clonidine 0.3 mg patch , nitroglycerin 0.'4mg'$  patch, lasix 40 qday, hydralazine 10 mg Q 4.  Hepatobil:AST 45 improved & ALT now wnl.  Alk phos up to 168, Tbili wnl.  Triglycerides 168 stable.  PAlb 17.7- marked  improvement Neuro: Hydromorphone prn ID: S/P Meropenem (5/25-6/4) for respiratory failure from PNA. WBC up to 14 (peaked at 36.7) Afebrile.  All cx negative, CDiff neg.  Best Practices:PPI, mc TPN Access:PICC line placed 5/20 TPN start date:5/22  Nutritional Goals (per RD recommendation on 5/25): KCal: 2000-2200 kcal/day  Protein: 110-120 gm/day   Goal  TPN rate: Clinimix 5/15 E @ 83 ml/hr + lipid emulsion 20 ml/hr   Current Nutrition:   TPN Clear liquid diet 6/4 >>  Plan:  Continue Clinimix E 5/15 at 83 ml/hr Continue lipid emulsion 20% at 20 ml/hr  This provides 100 g of protein and 1894 kCals per day meeting 91% of protein and 95% of kCal needs Continue MVI in TPN Continue TE in TPN every other day, next 6/5 Add Insulin 10 units to TPN Continue Lantus 40 units BID Continue resistant SSI and adjust as needed F/U diet tolerance, ability to wean TPN  K runs x 3 and Mg 1g IV BMet in AM   Lewie Chamber., PharmD Clinical Pharmacist [facility]

## 2016-07-07 ENCOUNTER — Inpatient Hospital Stay (HOSPITAL_COMMUNITY): Payer: BLUE CROSS/BLUE SHIELD

## 2016-07-07 DIAGNOSIS — I7102 Dissection of abdominal aorta: Secondary | ICD-10-CM

## 2016-07-07 LAB — GLUCOSE, CAPILLARY
Glucose-Capillary: 112 mg/dL — ABNORMAL HIGH (ref 65–99)
Glucose-Capillary: 120 mg/dL — ABNORMAL HIGH (ref 65–99)
Glucose-Capillary: 130 mg/dL — ABNORMAL HIGH (ref 65–99)
Glucose-Capillary: 145 mg/dL — ABNORMAL HIGH (ref 65–99)
Glucose-Capillary: 160 mg/dL — ABNORMAL HIGH (ref 65–99)
Glucose-Capillary: 172 mg/dL — ABNORMAL HIGH (ref 65–99)
Glucose-Capillary: 259 mg/dL — ABNORMAL HIGH (ref 65–99)

## 2016-07-07 LAB — CBC
HCT: 32 % — ABNORMAL LOW (ref 36.0–46.0)
Hemoglobin: 9.9 g/dL — ABNORMAL LOW (ref 12.0–15.0)
MCH: 31.9 pg (ref 26.0–34.0)
MCHC: 30.9 g/dL (ref 30.0–36.0)
MCV: 103.2 fL — ABNORMAL HIGH (ref 78.0–100.0)
Platelets: 291 10*3/uL (ref 150–400)
RBC: 3.1 MIL/uL — ABNORMAL LOW (ref 3.87–5.11)
RDW: 13.8 % (ref 11.5–15.5)
WBC: 15.5 10*3/uL — ABNORMAL HIGH (ref 4.0–10.5)

## 2016-07-07 LAB — BASIC METABOLIC PANEL
Anion gap: 10 (ref 5–15)
BUN: 48 mg/dL — ABNORMAL HIGH (ref 6–20)
CO2: 25 mmol/L (ref 22–32)
Calcium: 8.3 mg/dL — ABNORMAL LOW (ref 8.9–10.3)
Chloride: 102 mmol/L (ref 101–111)
Creatinine, Ser: 1.32 mg/dL — ABNORMAL HIGH (ref 0.44–1.00)
GFR calc Af Amer: 50 mL/min — ABNORMAL LOW (ref >60)
GFR calc non Af Amer: 43 mL/min — ABNORMAL LOW (ref >60)
Glucose, Bld: 182 mg/dL — ABNORMAL HIGH (ref 65–99)
Potassium: 3.8 mmol/L (ref 3.5–5.1)
Sodium: 137 mmol/L (ref 135–145)

## 2016-07-07 MED ORDER — IOPAMIDOL (ISOVUE-300) INJECTION 61%
INTRAVENOUS | Status: AC
  Filled 2016-07-07: qty 75

## 2016-07-07 MED ORDER — MAGIC MOUTHWASH
5.0000 mL | Freq: Four times a day (QID) | ORAL | Status: DC
  Administered 2016-07-07 – 2016-08-22 (×115): 5 mL via ORAL
  Filled 2016-07-07 (×110): qty 5

## 2016-07-07 MED ORDER — FAT EMULSION 20 % IV EMUL
240.0000 mL | INTRAVENOUS | Status: AC
  Administered 2016-07-07: 240 mL via INTRAVENOUS
  Filled 2016-07-07: qty 250

## 2016-07-07 MED ORDER — CLINIMIX E/DEXTROSE (5/15) 5 % IV SOLN
INTRAVENOUS | Status: AC
  Administered 2016-07-07: 17:00:00 via INTRAVENOUS
  Filled 2016-07-07: qty 1992

## 2016-07-07 MED ORDER — IOPAMIDOL (ISOVUE-300) INJECTION 61%
INTRAVENOUS | Status: AC
  Administered 2016-07-07: 30 mL
  Filled 2016-07-07: qty 30

## 2016-07-07 MED ORDER — FENTANYL CITRATE (PF) 100 MCG/2ML IJ SOLN
50.0000 ug | INTRAMUSCULAR | Status: DC | PRN
  Administered 2016-07-07 (×2): 50 ug via INTRAVENOUS
  Administered 2016-07-07: 25 ug via INTRAVENOUS
  Administered 2016-07-08: 50 ug via INTRAVENOUS
  Administered 2016-07-09: 25 ug via INTRAVENOUS
  Administered 2016-07-10 (×2): 50 ug via INTRAVENOUS
  Filled 2016-07-07 (×8): qty 2

## 2016-07-07 NOTE — Progress Notes (Signed)
CT Surgery  CT scan of abdomen images personally reviewed and report generated by radiologist read and reviewed with patient's husband No evidence of pneumatosis or perforation There does appear to be more intraluminal fluid and gas in both the stomach small bowel and colon She has had some liquid stools but a NG tube to remove upper gastrointestinal air and fluid would help reduce stress on her bowel wall and we will attempt to pass a another 16 French NG tube to continuous suction. Tharon Aquas trigt M.D.

## 2016-07-07 NOTE — Plan of Care (Signed)
Problem: Activity: Goal: Risk for activity intolerance will decrease Outcome: Progressing Pt walked x2 07/07/16. Will be x3 07/08/16  Problem: Bowel/Gastric: Goal: Gastrointestinal status for postoperative course will improve Outcome: Not Progressing Pt remains tender and distended. NG tube to CWS now per MD order.   Problem: Cardiac: Goal: Hemodynamic stability will improve Outcome: Progressing Within parameters

## 2016-07-07 NOTE — Progress Notes (Signed)
Nutrition Follow-up  DOCUMENTATION CODES:   Obesity unspecified  INTERVENTION:   Continue TPN (Clinimix and ILE) per Pharmacy  Monitor for diet advancement and tolerance, will add supplements as appropriate.   NUTRITION DIAGNOSIS:   Inadequate oral intake related to altered GI function as evidenced by NPO status. Ongoing.   GOAL:   Patient will meet greater than or equal to 90% of their needs Met.   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 Haley Graham admitted for pain control, blood pressure control, bed rest and IV hydration.   6/5 CT of abdomen shows gas-filled prominent size SB Per I/O documentation NG output: 6/3 750 ml; 6/4 200 ml   Spoke with Haley Graham who states she still has some abdominal pain but does feel hungry.   Medications reviewed and include: lantus, novolog, magic mouthwash Labs reviewed: Prealbumin 14.8-9.0-17.7 CBG's: 259-130 Weight near admission weight  TPN Prescription: Clinimix E 5/15 @ 83 ml/hr 20% ILE @ 20 ml/hr x 12 hours MVI daily TE every other day Provides: 1894 kcal (95% of needs) and 100 grams protein (91% of needs)  Diet Order:  TPN (CLINIMIX-E) Adult Diet NPO time specified TPN (CLINIMIX-E) Adult  Skin:  Reviewed, no issues  Last BM:  6/5  Height:   Ht Readings from Last 1 Encounters:  07/06/16 '5\' 3"'  (1.6 m)    Weight:   Wt Readings from Last 1 Encounters:  07/07/16 178 lb 2.1 oz (80.8 kg)    Ideal Body Weight:  52.2 kg  BMI:  Body mass index is 31.55 kg/m.  Estimated Nutritional Needs:   Kcal:  2000-2200  Protein:  110-120 gm  Fluid:  per MD  EDUCATION NEEDS:   No education needs identified at this time  [place], [place], [place] Pager [phone] After Hours Pager

## 2016-07-07 NOTE — Care Management Note (Signed)
Case Management Note Marvetta Gibbons RN, BSN Unit 2W-Case Manager-- [place] coverage [phone]  Patient Details  Name: Haley Graham MRN: [id number] Date of Birth: 10/27/1955  Subjective/Objective:  Pt admitted with Stanford type B aortic dissection-- With pancreatitis and ileus after Stanford type B dissection- has NGT in place                  Action/Plan: PTA pt lived at home with spouse- referral for FMLA paperwork needed by daughter- have referred her to her mother's primary care doctor regarding this- CM to follow for d/c needs  Expected Discharge Date:                  Expected Discharge Plan:  Home/Self Care  In-House Referral:     Discharge planning Services  CM Consult  Post Acute Care Choice:    Choice offered to:     DME Arranged:    DME Agency:     HH Arranged:    [place] Agency:     Status of Service:  In process, will continue to follow  If discussed at Long Length of Stay Meetings, dates discussed:  6/5  Discharge Disposition:   Additional Comments:  07/07/16- 1245- Ramonica Grigg RN, CM- pt remains- NPO, TNA- per MD note- plan repeat CT abd-pelvis to assess bowel viability- CM to continue to follow.   Dawayne Patricia, RN 07/07/2016, 12:54 PM

## 2016-07-07 NOTE — Progress Notes (Signed)
  Subjective: C/o abdominal poin but non-tender on exam KUB with more bowel edema Low grade fever with WBC up 15k Pt d/w Dr Hulen Skains- will repeat CT abd-pelvis to assess bowel viability.creat 1.3  Objective: Vital signs in last 24 hours: Temp:  [97.8 F (36.6 C)-100.1 F (37.8 C)] 97.8 F (36.6 C) (06/05 0804) Pulse Rate:  [82-105] 93 (06/05 0800) Cardiac Rhythm: Normal sinus rhythm (06/05 0430) Resp:  [16-26] 17 (06/05 0800) BP: (105-149)/(44-67) 140/66 (06/05 0800) SpO2:  [93 %-100 %] 99 % (06/05 0804) Weight:  [178 lb 2.1 oz (80.8 kg)] 178 lb 2.1 oz (80.8 kg) (06/05 0352)  Hemodynamic parameters for last 24 hours:  nsr BP well controlled  Intake/Output from previous day: 06/04 0701 - 06/05 0700 In: 2477.5 [I.V.:2327.5; IV Piggyback:150] Out: 1402 [Urine:1200; Emesis/NG output:200; Stool:2] Intake/Output this shift: No intake/output data recorded.  Alert Frustrated nsr abd sl distended w/o tenderness Legs warm with pedal pulses Lab Results:  Recent Labs  07/06/16 0411 07/07/16 0340  WBC 14.0* 15.5*  HGB 10.1* 9.9*  HCT 32.2* 32.0*  PLT 317 291   BMET:  Recent Labs  07/06/16 0411 07/07/16 0340  NA 137 137  K 3.6 3.8  CL 102 102  CO2 26 25  GLUCOSE 179* 182*  BUN 48* 48*  CREATININE 1.21* 1.32*  CALCIUM 8.5* 8.3*    PT/INR: No results for input(s): LABPROT, INR in the last 72 hours. ABG    Component Value Date/Time   PHART 7.430 06/28/2016 1615   HCO3 20.7 06/28/2016 1615   TCO2 22 06/28/2016 1615   ACIDBASEDEF 3.0 (H) 06/28/2016 1615   O2SAT 93.0 06/28/2016 1615   CBG (last 3)   Recent Labs  07/07/16 0003 07/07/16 0334 07/07/16 0759  GLUCAP 145* 112* 259*    Assessment/Plan: S/P type B aortic dissection Cont NPO, TNA CT abd-pelvis today   LOS: 17 days    [name] 07/07/2016

## 2016-07-07 NOTE — Progress Notes (Signed)
  PHARMACY - ADULT TOTAL PARENTERAL NUTRITION CONSULT NOTE   Pharmacy Consult for TPN Indication: prolonged NPO status/high NG output   Patient Measurements: Height: _0  (160 cm) Weight: 178 lb 2.1 oz (80.8 kg) IBW/kg (Calculated) : 52.4 TPN AdjBW (KG): 59.4 Body mass index is 31.55 kg/m. 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 Patient had a significant hx of N/V for 2-4 weeks prior to admission. Weight appears to have been maintained. Patient had a NG tube placed 5/20 which had significant output with 1-1.5 L a day, removed NG tube on 5/30, however, patient with significant episodes of N&V and NG replaced 6/1 with continuous low suction.  Still with significant output - plan is to continue NG another 24 hours.   GI:  NG tube removed 5/30 - replaced 6/1 (NG output 1.7L 6/3).   Ileus vs gastroparesis.  Abd distended, (-)BS.  Mult episodes vomiting with CL trial 6/4.  Prealbumin down to 9.0 and albumin down to 1.8.  Repeating CT 6/5.  Phenergan Q 6 prn, bisacodyl, simethicone, added Reglan 90m IV q6 (noted "allergy" per DrHendrickson- heart races, wants to try) Endo: Hx of DM. No insulin at home. CBGs 112-259 this am.  No hypoglycemic events.  Insulin requirements in the past 24 hours: 24 units of aspart; 80 units of Lantus  Lytes:  K 3.8- s/p K runs x 3.   Lasix 46mIV qday- dose dec 6/4. Renal:AKI: Scr 1.32 trending up  (Baseline < 1). UOP 1200, I/O (+)1.2L Pulm: RA; Dulera; Brovana, Albuterol  Cards: Stanford type B aortic dissection; BP up today had been soft, HR 70s-90s in NSR. Metoprolol IV, clonidine 0.3 mg patch , nitroglycerin 0.90m73match, lasix IV qday, hydralazine IV.  Hepatobil:AST 45 improved & ALT now wnl.  Alk phos up to 168, Tbili wnl.  Triglycerides 168 stable.  PAlb 17.7- marked improvement Neuro: Hydromorphone prn ID: S/P Meropenem (5/25-6/4) for respiratory failure from PNA. WBC  15.5 upward trend (peaked at 36.7).  Tm 100.1.  All cx negative, CDiff neg.  Best Practices:PPI, mc TPN Access:PICC line placed 5/20 TPN start date:5/22  Nutritional Goals (per RD recommendation on 5/25): KCal: 2000-2200 kcal/day  Protein: 110-120 gm/day   Goal  TPN rate: Clinimix 5/15 E @ 83 ml/hr + lipid emulsion 20 ml/hr   Current Nutrition:   TPN Clear liquid diet 6/4 >>  Plan:  Continue Clinimix E 5/15 at 83 ml/hr Continue lipid emulsion 20% at 20 ml/hr  This provides 100 g of protein and 1894 kCals per day meeting 91% of protein and 95% of kCal needs Continue MVI in TPN Continue TE in TPN every other day, next 6/6 Increase Insulin to 20 units in TPN and adjust as needed Continue Lantus 40 units BID Continue resistant SSI F/U diet tolerance, ability to wean TPN     KenLewie ChamberPharmD Clinical Pharmacist [facility]

## 2016-07-07 NOTE — Progress Notes (Signed)
Physical Therapy Treatment Patient Details Name: Haley Graham MRN: [id number] DOB: 1955/12/08 Today's Date: 07/07/2016    History of Present Illness Pt adm with type B aortic dissection. Treated non surgically with BP control and pain control. Pt developed pancreatitis and ileus with NG tube placed. PMH - anxiety, arthritis    PT Comments    Patient tolerated ambulating 139ft with a seated rest break. HR up to 130s with mobility and other vitals WNL. Pt is anxious with mobility and needs encouragement for increased mobility. Continue to progress as tolerated with anticipated d/c home with HHPT.   Follow Up Recommendations  Home health PT;Supervision/Assistance - 24 hour     Equipment Recommendations  Rolling walker with 5" wheels    Recommendations for Other Services       Precautions / Restrictions Precautions Precautions: Fall Restrictions Weight Bearing Restrictions: No    Mobility  Bed Mobility Overal bed mobility: Needs Assistance Bed Mobility: Rolling;Sidelying to Sit;Sit to Sidelying Rolling: Supervision Sidelying to sit: Min assist     Sit to sidelying: Min assist General bed mobility comments: cues for sequencing and technique; assist for elevation of trunk into sitting and then to bring bilat LE into bed  Transfers Overall transfer level: Needs assistance Equipment used: Rolling walker (2 wheeled);1 person hand held assist Transfers: Sit to/from Stand Sit to Stand: Min guard;Min assist         General transfer comment: cues for hand placement; HHA assist for stand pivot to [facility]  Ambulation/Gait Ambulation/Gait assistance: Min guard Ambulation Distance (Feet): 120 Feet (with seated break ~half way) Assistive device: Rolling walker (2 wheeled) Gait Pattern/deviations: Step-through pattern;Decreased stride length Gait velocity: decr   General Gait Details: one seated rest break required; pt with tachycardia with mobility; SpO2 remained WNL; cues for  breathing and maintaining eyes open and for gaze stabilization; pt c/o dizziness   Stairs            Wheelchair Mobility    Modified Rankin (Stroke Patients Only)       Balance Overall balance assessment: Needs assistance   Sitting balance-Leahy Scale: Fair     Standing balance support: Single extremity supported Standing balance-Leahy Scale: Poor Standing balance comment: walker and min guard assist for static standing                            Cognition Arousal/Alertness: Awake/alert Behavior During Therapy: Flat affect;Anxious Overall Cognitive Status: Within Functional Limits for tasks assessed                                        Exercises      General Comments General comments (skin integrity, edema, etc.): husband present; pt encouraged to use incentive spirometer 10X every hour      Pertinent Vitals/Pain Pain Assessment: Faces Faces Pain Scale: Hurts even more Pain Location: abdomen Pain Descriptors / Indicators: Grimacing;Sharp Pain Intervention(s): Limited activity within patient's tolerance;Monitored during session;Premedicated before session    Home Living                      Prior Function            PT Goals (current goals can now be found in the care plan section) Progress towards PT goals: Progressing toward goals    Frequency    Min 3X/week  PT Plan Current plan remains appropriate    Co-evaluation              AM-PAC PT "6 Clicks" Daily Activity  Outcome Measure  Difficulty turning over in bed (including adjusting bedclothes, sheets and blankets)?: A Little Difficulty moving from lying on back to sitting on the side of the bed? : Total Difficulty sitting down on and standing up from a chair with arms (e.g., wheelchair, bedside commode, etc,.)?: A Little Help needed moving to and from a bed to chair (including a wheelchair)?: A Little Help needed walking in hospital room?: A  Little Help needed climbing 3-5 steps with a railing? : A Lot 6 Click Score: 15    End of Session Equipment Utilized During Treatment: Gait belt Activity Tolerance: Patient limited by fatigue Patient left: with call bell/phone within reach;with family/visitor present;in bed;with nursing/sitter in room Nurse Communication: Mobility status PT Visit Diagnosis: Muscle weakness (generalized) (M62.81);Other abnormalities of gait and mobility (R26.89)     Time: 6440-3474 PT Time Calculation (min) (ACUTE ONLY): 27 min  Charges:  $Gait Training: 8-22 mins $Therapeutic Activity: 8-22 mins                    G Codes:       Earney Navy, PTA Pager: [phone]     Darliss Cheney 07/07/2016, 2:45 PM

## 2016-07-07 NOTE — Progress Notes (Signed)
NGT placed at bedside; pt tol well 237ml out at time of insertion

## 2016-07-07 NOTE — Progress Notes (Signed)
[facility] Progress Note    Subjective: Patient not getting any better.  Vomited a lot yesterday.  Objective: Vital signs in last 24 hours: Temp:  [97.8 F (36.6 C)-100.1 F (37.8 C)] 97.8 F (36.6 C) (06/05 0804) Pulse Rate:  [82-105] 91 (06/05 0500) Resp:  [16-26] 24 (06/05 0500) BP: (105-149)/(44-67) 130/59 (06/05 0500) SpO2:  [93 %-100 %] 99 % (06/05 0804) Weight:  [80.8 kg (178 lb 2.1 oz)] 80.8 kg (178 lb 2.1 oz) (06/05 0352) Last BM Date: 07/06/16  Intake/Output from previous day: 06/04 0701 - 06/05 0700 In: 2477.5 [I.V.:2327.5; IV Piggyback:150] Out: 1402 [Urine:1200; Emesis/NG output:200; Stool:2] Intake/Output this shift: No intake/output data recorded.  General: Sitting in chair, wants to eat, but vomited most of yesterday.  Lungs: Clear.  Getting breathing treatment currently.  Abd: Distended, no bowel sounds.  No peritonitis, but seemed a bit tender with deep palpation.  Extremities: No changes  Neuro: Intact  Lab Results:  @LABLAST2 (wbc:2,hgb:2,hct:2,plt:2) BMET ) Recent Labs  07/06/16 0411 07/07/16 0340  NA 137 137  K 3.6 3.8  CL 102 102  CO2 26 25  GLUCOSE 179* 182*  BUN 48* 48*  CREATININE 1.21* 1.32*  CALCIUM 8.5* 8.3*   PT/INR No results for input(s): LABPROT, INR in the last 72 hours. ABG No results for input(s): PHART, HCO3 in the last 72 hours.  Invalid input(s): PCO2, PO2  Studies/Results: Dg Abd Portable 1v  Result Date: 07/06/2016 CLINICAL DATA:  Multiple episodes of vomiting, abdominal pain and distention. EXAM: PORTABLE ABDOMEN - 1 VIEW COMPARISON:  07/04/2016 FINDINGS: There is persistent dilation and wall edema of small bowel loops, with featureless appearance of some of the visualized small bowel loops. No radiographic evidence of organomegaly or free gas on this limited supine radiograph. IMPRESSION: Persistent dilation/wall edema and featureless appearance of small bowel loops in a nonspecific pattern. These findings may be  seen with ileus, incomplete small bowel obstruction or enteritis. Ischemic enteritis continues to be of consideration. Electronically Signed   By: Fidela Salisbury M.D.   On: 07/06/2016 16:14    Anti-infectives: Anti-infectives    Start     Dose/Rate Route Frequency Ordered Stop   06/26/16 0830  meropenem (MERREM) 1 g in sodium chloride 0.9 % 100 mL IVPB  Status:  Discontinued     1 g 200 mL/hr over 30 Minutes Intravenous Every 8 hours 06/26/16 0810 07/06/16 0804   06/22/16 1800  clindamycin (CLEOCIN) IVPB 600 mg  Status:  Discontinued     600 mg 100 mL/hr over 30 Minutes Intravenous Every 8 hours 06/22/16 1723 06/26/16 0810   06/22/16 1000  azithromycin (ZITHROMAX) 250 mg in dextrose 5 % 125 mL IVPB  Status:  Discontinued     250 mg 125 mL/hr over 60 Minutes Intravenous Every 24 hours 06/22/16 0826 06/27/16 1019   06/22/16 0900  levofloxacin (LEVAQUIN) IVPB 500 mg  Status:  Discontinued     500 mg 100 mL/hr over 60 Minutes Intravenous Every 24 hours 06/22/16 0759 06/22/16 0824      Assessment/Plan: s/p  Keep NPO and get abdominal CT scan again with contrast.  LOS: 17 days   Kathryne Eriksson. Dahlia Bailiff, MD, FACS [phone] [facility] Surgery 07/07/2016

## 2016-07-07 NOTE — Plan of Care (Signed)
Problem: Education: Goal: Knowledge of [place] [facility] Education information/materials will improve Outcome: Progressing Education ongoing  Problem: Health Behavior/Discharge Planning: Goal: Ability to manage health-related needs will improve Outcome: Progressing Education ongoing  Problem: Pain Managment: Goal: General experience of comfort will improve Outcome: Not Progressing Pt remains nauseous at times.  Problem: Physical Regulation: Goal: Ability to maintain clinical measurements within normal limits will improve Outcome: Progressing Within parameters

## 2016-07-07 NOTE — Progress Notes (Signed)
Patient ID: Haley Graham, female   DOB: 1955-08-03, 60 y.o.   MRN: [id number]  SICU Evening Rounds:  Hemodynamically stable  Urine output ok  NG placed for abdominal distension with fluid and gas-filled intestine on CT today.

## 2016-07-07 NOTE — Progress Notes (Signed)
Subjective: Interval History: none.. Sitting up in chair this morning. Has been present. Denies abdominal pain. Asking for something to eat.   Objective: Vital signs in last 24 hours: Temp:  [97.8 F (36.6 C)-100.1 F (37.8 C)] 97.8 F (36.6 C) (06/05 0804) Pulse Rate:  [82-105] 93 (06/05 0800) Resp:  [16-26] 17 (06/05 0800) BP: (105-149)/(44-67) 140/66 (06/05 0833) SpO2:  [93 %-100 %] 99 % (06/05 0804) Weight:  [178 lb 2.1 oz (80.8 kg)] 178 lb 2.1 oz (80.8 kg) (06/05 0352)  Intake/Output from previous day: 06/04 0701 - 06/05 0700 In: 2477.5 [I.V.:2327.5; IV Piggyback:150] Out: 1402 [Urine:1200; Emesis/NG output:200; Stool:2] Intake/Output this shift: No intake/output data recorded.  Soft mild distention. No tenderness.  Lab Results:  Recent Labs  07/06/16 0411 07/07/16 0340  WBC 14.0* 15.5*  HGB 10.1* 9.9*  HCT 32.2* 32.0*  PLT 317 291   BMET  Recent Labs  07/06/16 0411 07/07/16 0340  NA 137 137  K 3.6 3.8  CL 102 102  CO2 26 25  GLUCOSE 179* 182*  BUN 48* 48*  CREATININE 1.21* 1.32*  CALCIUM 8.5* 8.3*    Studies/Results: Ct Abdomen Pelvis Wo Contrast  Result Date: 06/26/2016 CLINICAL DATA:  Shortness of breath EXAM: CT CHEST, ABDOMEN AND PELVIS WITHOUT CONTRAST TECHNIQUE: Multidetector CT imaging of the chest, abdomen and pelvis was performed following the standard protocol without IV contrast. COMPARISON:  06/20/2016 chest CT FINDINGS: CT CHEST FINDINGS Cardiovascular: Normal heart size. No pericardial effusion. 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 Mild haziness around the upper descending segment is likely stable. Stable maximal diameter of 33 mm at this level. No intramural hematoma. Intermittently seen displaced intimal calcification. Mediastinum/Nodes: Negative for adenopathy. Right upper extremity PICC with tip at the SVC level. Lungs/Pleura: Multi segment atelectasis, worse on the left. Small pleural effusions. There is patchy  ground-glass airspace density in the bilateral lungs without gradient. Airways are clear and there is no septal thickening. Musculoskeletal: No acute or aggressive finding. CT ABDOMEN PELVIS FINDINGS Hepatobiliary: Hepatic steatosis.Cholecystectomy with normal common bile duct diameter. Pancreas: Unremarkable. Spleen: Unremarkable. 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 The larger measures up to 27 mm and is indeterminate by densitometry, but left adrenal mass has been noted since at least 2006 abdominal CT report, images not available. Left renal cyst. No hydronephrosis or urolithiasis. 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 Negative decompressed urinary bladder. Stomach/Bowel: There is diffuse small bowel distention and fluid filling with mildly hazy mesenteries. Similar features in the colon proximally and at the transverse segment. No pneumatosis or perforation is noted. Nasogastric tube tip is at the pylorus. Vascular/Lymphatic: There is intimal flap displacement from known dissection, morphology appearing similar to prior. No mass or adenopathy. Reproductive:Hysterectomy.  Unremarkable ovaries. Other: No ascites or pneumoperitoneum.  Anasarca. Musculoskeletal: No acute abnormalities. These results were called by telephone at the time of interpretation on 06/26/2016 at 2:05 pm to Dr. Ivin Poot , who verbally acknowledged these results. IMPRESSION: 1. Diffuse airspace disease. Pattern can be seen with noncardiogenic edema (ARDS), inflammatory pneumonitis, or atypical infection. Multi segment atelectasis at the bases and small pleural effusions. 2. Diffuse small bowel and colonic distention with fluid levels as seen with ileus. Question underlying bowel ischemia given the patient's known aortic dissection with proximal IMA occlusion and SMA flow via the narrow true lumen. 3. Incidental findings noted  above. Electronically Signed  By: Monte Fantasia M.D.   On: 06/26/2016 14:05   Ct Chest Wo Contrast  Result Date: 06/26/2016 CLINICAL DATA:  Shortness of breath EXAM: CT CHEST, ABDOMEN AND PELVIS WITHOUT CONTRAST TECHNIQUE: Multidetector CT imaging of the chest, abdomen and pelvis was performed following the standard protocol without IV contrast. COMPARISON:  06/20/2016 chest CT FINDINGS: CT CHEST FINDINGS Cardiovascular: Normal heart size. No pericardial effusion. 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 Mild haziness around the upper descending segment is likely stable. Stable maximal diameter of 33 mm at this level. No intramural hematoma. Intermittently seen displaced intimal calcification. Mediastinum/Nodes: Negative for adenopathy. Right upper extremity PICC with tip at the SVC level. Lungs/Pleura: Multi segment atelectasis, worse on the left. Small pleural effusions. There is patchy ground-glass airspace density in the bilateral lungs without gradient. Airways are clear and there is no septal thickening. Musculoskeletal: No acute or aggressive finding. CT ABDOMEN PELVIS FINDINGS Hepatobiliary: Hepatic steatosis.Cholecystectomy with normal common bile duct diameter. Pancreas: Unremarkable. Spleen: Unremarkable. 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 The larger measures up to 27 mm and is indeterminate by densitometry, but left adrenal mass has been noted since at least 2006 abdominal CT report, images not available. Left renal cyst. No hydronephrosis or urolithiasis. 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 Negative decompressed urinary bladder. Stomach/Bowel: There is diffuse small bowel distention and fluid filling with mildly hazy mesenteries. Similar features in the colon proximally and at the transverse segment. No pneumatosis or perforation is noted. Nasogastric tube tip is  at the pylorus. Vascular/Lymphatic: There is intimal flap displacement from known dissection, morphology appearing similar to prior. No mass or adenopathy. Reproductive:Hysterectomy.  Unremarkable ovaries. Other: No ascites or pneumoperitoneum.  Anasarca. Musculoskeletal: No acute abnormalities. These results were called by telephone at the time of interpretation on 06/26/2016 at 2:05 pm to Dr. Ivin Poot , who verbally acknowledged these results. IMPRESSION: 1. Diffuse airspace disease. Pattern can be seen with noncardiogenic edema (ARDS), inflammatory pneumonitis, or atypical infection. Multi segment atelectasis at the bases and small pleural effusions. 2. Diffuse small bowel and colonic distention with fluid levels as seen with ileus. Question underlying bowel ischemia given the patient's known aortic dissection with proximal IMA occlusion and SMA flow via the narrow true lumen. 3. Incidental findings noted above. Electronically Signed   By: Monte Fantasia M.D.   On: 06/26/2016 14:05   Dg Chest Port 1 View  Result Date: 07/04/2016 CLINICAL DATA:  Followup shortness of breath.  Aortic dissection. EXAM: PORTABLE CHEST 1 VIEW COMPARISON:  07/01/2016 FINDINGS: Nasogastric tube enters the stomach. Right arm PICC tip is in the SVC above right atrium. Less pulmonary edema. Persistent left effusion. Persistent volume loss in both lower lobes left worse than right. Chronic prominence of the aortic shadow consistent with the history of dissection. IMPRESSION: Less pulmonary edema. Persistent left effusion. Persistent atelectasis in both lower lobes left more than right. Persistent prominent aortic/mediastinal shadow. Electronically Signed   By: Nelson Chimes M.D.   On: 07/04/2016 08:11   Dg Chest Port 1 View  Result Date: 07/01/2016 CLINICAL DATA:  Shortness of breath common pneumonia, thoracic aortic dissection EXAM: PORTABLE CHEST 1 VIEW COMPARISON:  Portable chest x-ray of Jun 30, 2016 FINDINGS: The lungs are  adequately inflated. The pulmonary interstitial markings are less prominent. The retrocardiac region remains dense and there is remains partial obscuration of the left  hemidiaphragm. The heart is top-normal in size. The mediastinum is normal in width. The esophagogastric tube tip projects below the inferior margin of the image. The right PICC line tip projects over the midportion of the SVC. IMPRESSION: Slight interval improvement in the appearance of the pulmonary interstitium suggests decreasing interstitial edema. There is persistent left lower lobe atelectasis or pneumonia. Electronically Signed   By: David  Martinique M.D.   On: 07/01/2016 07:35   Dg Chest Port 1 View  Result Date: 06/30/2016 CLINICAL DATA:  Breath, pneumonia, aortic aneurysm, GERD, hypertension, diabetes mellitus EXAM: PORTABLE CHEST 1 VIEW COMPARISON:  Portable exam 0633 hours compared to 06/29/2016 FINDINGS: Nasogastric tube coiled in proximal stomach. RIGHT arm PICC line tip projects over SVC above cavoatrial junction. Upper normal heart size. Mediastinal contours normal. Mild pulmonary vascular congestion. Persistent diffuse BILATERAL infiltrates. Atelectasis versus consolidation LEFT lower lobe. No pneumothorax. IMPRESSION: Persistent mild diffuse interstitial infiltrates with atelectasis versus consolidation in LEFT lower lobe. Electronically Signed   By: Lavonia Dana M.D.   On: 06/30/2016 06:58   Dg Chest Port 1 View  Result Date: 06/29/2016 CLINICAL DATA:  Shortness of Breath EXAM: PORTABLE CHEST 1 VIEW COMPARISON:  06/28/2016 FINDINGS: Nasogastric catheter is noted coiled within the stomach. Right-sided PICC line is seen in the distal superior vena cava. Cardiac shadow is mildly enlarged but stable. Bilateral vascular congestion and edema is seen stable from prior exam. Some atelectasis remains in the left retrocardiac region. IMPRESSION: No significant interval change from the prior exam. Electronically Signed   By: Inez Catalina  M.D.   On: 06/29/2016 07:36   Dg Chest Port 1 View  Result Date: 06/28/2016 CLINICAL DATA:  Aortic dissection. EXAM: PORTABLE CHEST 1 VIEW COMPARISON:  06/27/2016. FINDINGS: The heart is enlarged. There is moderate vascular congestion. Prominence of the aortic contour reflecting the type B dissection. LEFT lower lobe atelectasis with effusion. IMPRESSION: Cardiomegaly. Aortic dissection. Vascular congestion. LEFT lower lobe atelectasis with effusion. Electronically Signed   By: Staci Righter M.D.   On: 06/28/2016 07:13   Dg Chest Port 1 View  Result Date: 06/27/2016 CLINICAL DATA:  ARDS. EXAM: PORTABLE CHEST 1 VIEW COMPARISON:  Jun 26, 2016 FINDINGS: The right PICC line is stable. The NG tube terminates below today's study. Diffuse bilateral primarily interstitial opacities are mildly more homogeneous in less patchy in appearance. More focal opacity in the left base is stable. No other changes. IMPRESSION: 1. Diffuse bilateral primarily interstitial opacities suggesting edema remain. More focal opacity in the left base is stable to mildly improved. Electronically Signed   By: Dorise Bullion III M.D   On: 06/27/2016 07:21   Dg Chest Port 1 View  Result Date: 06/26/2016 CLINICAL DATA:  Shortness of breath. EXAM: PORTABLE CHEST 1 VIEW COMPARISON:  Radiograph of Jun 25, 2016. FINDINGS: Stable cardiomegaly. Increased bilateral diffuse interstitial and basilar opacities are noted concerning for worsening edema. No pneumothorax is noted. Mild left pleural effusion is noted with associated atelectasis or infiltrate. Right-sided PICC line is unchanged in position. Nasogastric tube is unchanged in position. Bony thorax is unremarkable. IMPRESSION: Increased bilateral diffuse interstitial densities consistent with edema. Mild left pleural effusion is noted with associated atelectasis or infiltrate. Electronically Signed   By: Marijo Conception, M.D.   On: 06/26/2016 07:40   Dg Chest Port 1 View  Result Date:  06/25/2016 CLINICAL DATA:  Shortness of Breath EXAM: PORTABLE CHEST 1 VIEW COMPARISON:  06/24/2016 FINDINGS: Cardiac shadow is prominent but stable. Right-sided PICC  line and nasogastric catheter are again seen and stable. Bibasilar atelectatic changes are noted worse on the left than the right and increased from the prior exam. Small left pleural effusion is noted as well. Mild vascular congestion is seen. IMPRESSION: Increasing bibasilar infiltrates left greater than right. Mild vascular congestion is noted. The Electronically Signed   By: Inez Catalina M.D.   On: 06/25/2016 07:54   Dg Chest Port 1 View  Result Date: 06/24/2016 CLINICAL DATA:  Shortness of Breath EXAM: PORTABLE CHEST 1 VIEW COMPARISON:  06/23/2016 FINDINGS: Right-sided PICC line is again noted at the cavoatrial junction. Nasogastric catheter is noted in satisfactory position through the overall inspiratory effort is decreased from the prior exam. Mild left basilar atelectasis remains. No other focal abnormality is noted. IMPRESSION: Stable left basilar atelectasis. Electronically Signed   By: Inez Catalina M.D.   On: 06/24/2016 08:07   Dg Chest Port 1 View  Result Date: 06/23/2016 CLINICAL DATA:  61 year old female with shortness of breath and abdominal pain. 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 Being treated conservatively at this time, including with bowel rest. EXAM: PORTABLE CHEST 1 VIEW COMPARISON:  Chest radiographs 06/22/2016 and earlier. CTA 06/20/2016 FINDINGS: Portable AP semi upright view at 0754 hours. Stable enteric tube which courses to the abdomen. Stable right PICC line. Mildly larger lung volumes and decreased streaky bibasilar opacity. Mild residual. No pneumothorax or pulmonary edema. No pleural effusion or consolidation. Stable cardiac size and mediastinal contours. IMPRESSION: 1.  Stable lines and tubes.  2. Mildly improved lung volumes and regressed bibasilar opacity favored to be atelectasis. Electronically Signed   By: Genevie Ann M.D.   On: 06/23/2016 08:19   Dg Chest Port 1 View  Result Date: 06/22/2016 CLINICAL DATA:  61 year old female with history of abdominal aortic aneurysm. Shortness of breath and vomiting. EXAM: PORTABLE CHEST 1 VIEW COMPARISON:  Chest x-ray 06/21/2016. FINDINGS: There is a right upper extremity PICC with tip terminating in the superior cavoatrial junction. A nasogastric tube is seen extending into the stomach, however, the tip of the nasogastric tube extends below the lower margin of the image. Lung volumes are low. Linear bibasilar opacities favored to reflect areas of subsegmental atelectasis, however, underlying airspace consolidation from infection or aspiration is not excluded. No pleural effusions. No evidence of pulmonary edema. Heart size is normal. Upper mediastinal contours are within normal limits. IMPRESSION: 1. Support apparatus, as above. 2. Low lung volumes with bibasilar areas of atelectasis and/or airspace consolidation. Electronically Signed   By: Vinnie Langton M.D.   On: 06/22/2016 07:30   Dg Chest Port 1 View  Result Date: 06/21/2016 CLINICAL DATA:  Aortic dissection EXAM: PORTABLE CHEST 1 VIEW COMPARISON:  06/20/2016 FINDINGS: Bibasilar atelectasis. Heart is normal size. No effusions or acute bony abnormality. IMPRESSION: Bibasilar atelectasis. Electronically Signed   By: Rolm Baptise M.D.   On: 06/21/2016 07:18   Dg Chest Port 1 View  Result Date: 06/20/2016 CLINICAL DATA:  Hypoxia, unresponsive EXAM: PORTABLE CHEST 1 VIEW COMPARISON:  CTA chest dated 06/20/2016 FINDINGS: Lungs are essentially clear. No focal consolidation. No pleural effusion or pneumothorax. The heart is top-normal in size. IMPRESSION: No evidence of acute cardiopulmonary disease. Electronically Signed   By: Julian Hy M.D.   On: 06/20/2016 17:01   Dg Chest Port 1  View  Result Date: 06/20/2016 CLINICAL DATA:  Sudden onset sharp substernal chest pain that  radiates to the back. Nausea, dizziness and shortness of breath. EXAM: PORTABLE CHEST 1 VIEW COMPARISON:  None. FINDINGS: Trachea is midline. Heart size is accentuated by AP supine technique. Probable mild scarring in the right middle lobe and lingula. Lungs are otherwise clear. No pleural fluid. IMPRESSION: No acute findings. Electronically Signed   By: Lorin Picket M.D.   On: 06/20/2016 09:56   Dg Abd Portable 1v  Result Date: 07/06/2016 CLINICAL DATA:  Multiple episodes of vomiting, abdominal pain and distention. EXAM: PORTABLE ABDOMEN - 1 VIEW COMPARISON:  07/04/2016 FINDINGS: There is persistent dilation and wall edema of small bowel loops, with featureless appearance of some of the visualized small bowel loops. No radiographic evidence of organomegaly or free gas on this limited supine radiograph. IMPRESSION: Persistent dilation/wall edema and featureless appearance of small bowel loops in a nonspecific pattern. These findings may be seen with ileus, incomplete small bowel obstruction or enteritis. Ischemic enteritis continues to be of consideration. Electronically Signed   By: Fidela Salisbury M.D.   On: 07/06/2016 16:14   Dg Abd Portable 1v  Result Date: 07/04/2016 CLINICAL DATA:  Aortic dissection. EXAM: PORTABLE ABDOMEN - 1 VIEW COMPARISON:  07/02/2016 FINDINGS: Nasogastric tube tip in the region of the antrum/pylorus. Persistent group of dilated small bowel loops in the left mid abdomen with possible bowel edema. Possibility of bowel ischemia certainly does exist. Similar appearance to the study of 07/02/2016. IMPRESSION: Nasogastric tube in place. Otherwise similar appearance with abnormal small bowel pattern in the left central abdomen which could go along with partial small bowel obstruction or a focal enteritis including ischemic. Electronically Signed   By: Nelson Chimes M.D.   On: 07/04/2016  08:13   Dg Abd Portable 1v  Result Date: 07/02/2016 CLINICAL DATA:  Nasogastric tube removed.  Acute abdominal pain. EXAM: PORTABLE ABDOMEN - 1 VIEW COMPARISON:  06/27/2016 FINDINGS: Nasogastric tube is been removed. There is a group of dilated fluid and air-filled loops of small intestine in the left abdomen with apparent wall thickening. This could be due to partial obstruction or enteritis, including ischemic bowel insult. IMPRESSION: Nasogastric tube removed. Abnormal dilated small intestine in the left central abdomen. Differential diagnosis partial small bowel obstruction versus enteritis, including ischemic. Electronically Signed   By: Nelson Chimes M.D.   On: 07/02/2016 07:37   Dg Abd Portable 1v  Result Date: 06/27/2016 CLINICAL DATA:  Ileus. EXAM: PORTABLE ABDOMEN - 1 VIEW COMPARISON:  Jun 26, 2016 FINDINGS: The NG tube terminates within the stomach. The bowel gas pattern is unremarkable on today's study. No other acute abnormalities. IMPRESSION: The NG tube terminates within the stomach. The bowel gas pattern is unremarkable. Electronically Signed   By: Dorise Bullion III M.D   On: 06/27/2016 07:19   Dg Abd Portable 1v  Result Date: 06/26/2016 CLINICAL DATA:  Shortness of breath, abdominal pain EXAM: PORTABLE ABDOMEN - 1 VIEW COMPARISON:  06/25/2016 FINDINGS: NG tube tip is in the distal stomach. Prior cholecystectomy. Nonobstructive bowel gas pattern. No free air organomegaly. IMPRESSION: NG tube tip in the distal stomach.  No acute findings. Electronically Signed   By: Rolm Baptise M.D.   On: 06/26/2016 07:36   Dg Abd Portable 1v  Result Date: 06/25/2016 CLINICAL DATA:  Shortness of breath.  Abdominal pain. EXAM: PORTABLE ABDOMEN - 1 VIEW COMPARISON:  Jun 24, 2016 FINDINGS: The distal tip of the NG tube is near the gastric antrum. Mild opacity in left lung base will be better assessed on today's  chest x-ray. No free air or portal venous gas identified although evaluation is limited on  supine imaging. No bowel obstruction. IMPRESSION: No interval change or acute abnormality in the abdomen. Electronically Signed   By: Dorise Bullion III M.D   On: 06/25/2016 07:54   Dg Abd Portable 1v  Result Date: 06/24/2016 CLINICAL DATA:  Abdominal distention. EXAM: PORTABLE ABDOMEN - 1 VIEW COMPARISON:  06/23/2016 . FINDINGS: NG tube noted in stable position with tip in the stomach . Surgical clips right upper quadrant. Several nonspecific loops of air-filled small bowel again noted. Colonic gas pattern is normal. No free air. IMPRESSION: 1.  NG tube in stable position. 2. Several nonspecific loops of air-filled small bowel again noted. No evidence of progressive bowel distention. Colonic gas pattern normal. No free air. Electronically Signed   By: Marcello Moores  Register   On: 06/24/2016 08:06   Dg Abd Portable 1v  Result Date: 06/23/2016 CLINICAL DATA:  61 year old female with shortness of breath and abdominal pain. 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 Being treated conservatively at this time, including with bowel rest. EXAM: PORTABLE ABDOMEN - 1 VIEW COMPARISON:  Abdominal radiographs 06/22/2016. CTA 06/20/2016, and earlier. FINDINGS: Portable AP supine view at 0759 hours. Stable NG tube, side hole to level of the gastric body and tip at the level of the gastric antrum. Stable cholecystectomy clips. Stable bowel gas pattern with several gas containing nondilated small and large bowel loops. No definite pneumoperitoneum on this supine view. Abdominal and pelvic visceral contours appear stable. No acute osseous abnormality identified. IMPRESSION: 1. Stable NG tube position. 2. Stable, nonobstructed bowel-gas pattern. Electronically Signed   By: Genevie Ann M.D.   On: 06/23/2016 08:18   Dg Abd Portable 1v  Result Date: 06/22/2016 CLINICAL DATA:  Abdominal pain for few days with nausea and  vomiting. EXAM: PORTABLE ABDOMEN - 1 VIEW COMPARISON:  06/22/2016 abdominal radiograph. FINDINGS: Enteric tube loops in the gastric fundus and terminates in the body of the stomach, without appreciable kink. Top-normal caliber small bowel loops throughout the abdomen. Minimal colonic stool. No evidence of pneumatosis or pneumoperitoneum. No radiopaque urolithiasis. Cholecystectomy clips are seen in the right upper quadrant of the abdomen. Patchy opacities at the lung bases. IMPRESSION: 1. Enteric tube loops in the gastric fundus and terminates in the body of the stomach without appreciable kink. 2. Top-normal caliber small bowel loops, unchanged. 3. Patchy bibasilar lung opacities, correlate with chest radiograph. Electronically Signed   By: Ilona Sorrel M.D.   On: 06/22/2016 16:58   Dg Abd Portable 1v  Result Date: 06/22/2016 CLINICAL DATA:  Vomiting.  Shortness of breath.  AAA . EXAM: PORTABLE ABDOMEN - 1 VIEW COMPARISON:  06/21/2016. FINDINGS: Surgical clips right upper quadrant. NG tube noted with tip over the distal stomach. No bowel distention. No acute bony abnormality identified. Mild basilar atelectasis. IMPRESSION: NG tube noted with its tip over the distal stomach. No bowel distention or acute intra-abdominal abnormality identified. Electronically Signed   By: Marcello Moores  Register   On: 06/22/2016 07:30   Dg Abd Portable 1v  Result Date: 06/21/2016 CLINICAL DATA:  Feeding tube placement EXAM: PORTABLE ABDOMEN - 1 VIEW COMPARISON:  06/21/2016 FINDINGS: NG tube coils in the fundus of the stomach with the tip in the distal stomach. Decompression of the stomach. IMPRESSION: NG tube tip in the distal stomach. Electronically Signed   By: Rolm Baptise M.D.  On: 06/21/2016 15:46   Dg Abd Portable 1v  Result Date: 06/21/2016 CLINICAL DATA:  Ileus  Vomiting that started this AM per patient EXAM: PORTABLE ABDOMEN - 1 VIEW COMPARISON:  06/20/2016 FINDINGS: Gaseous distention of the stomach. Paucity of small  bowel and colonic gas. Cholecystectomy clips. Linear scarring/ atelectasis in the lung bases. IMPRESSION: 1. Gaseous distention of the stomach. Electronically Signed   By: Lucrezia Europe M.D.   On: 06/21/2016 11:54   Dg Abd Portable 1v  Result Date: 06/20/2016 CLINICAL DATA:  Aortic dissection EXAM: PORTABLE ABDOMEN - 1 VIEW COMPARISON:  CT abdomen/ pelvis dated 06/20/2016 FINDINGS: Nonobstructive bowel gas pattern. Visualized osseous structures are within normal limits. IMPRESSION: Unremarkable abdominal radiograph. Electronically Signed   By: Julian Hy M.D.   On: 06/20/2016 17:02   Ct Angio Chest/abd/pel For Dissection W And/or W/wo  Result Date: 06/27/2016 CLINICAL DATA:  61 year old female with a history of type B dissection EXAM: CT ANGIOGRAPHY CHEST, ABDOMEN AND PELVIS TECHNIQUE: Multidetector CT imaging through the chest, abdomen and pelvis was performed using the standard protocol during bolus administration of intravenous contrast. Multiplanar reconstructed images and MIPs were obtained and reviewed to evaluate the vascular anatomy. CONTRAST:  100 cc Isovue 370 COMPARISON:  CT 06/20/2016 FINDINGS: CTA CHEST FINDINGS Cardiovascular: Heart: Heart size unchanged. No pericardial fluid/ thickening. No significant coronary calcifications. Aorta: Re- demonstration of type B dissection with the entry tear appearing to originate just beyond the origin of the left subclavian artery. Branch vessels remain patent without extension of the dissection flap into the branch vessels. Caliber and contour of the ascending aorta unremarkable without evidence of retrograde extension. Greatest diameter of the ascending aorta 2.9 cm. Inflammatory changes surrounding the distal aortic arch in the proximal descending aorta. Greatest diameter of the distal aortic arch measures approximately 3.4 cm. Greatest diameter on the comparison CT approximately 3.0 cm. No aneurysm of the descending thoracic aorta with the greatest  diameter of the false lumen measuring 2.7 cm. True lumen is compressed. There is a fenestration in the distal true lumen at the level of the diaphragm just above the aortic hiatus. Pulmonary arteries: No lobar, segmental, or proximal subsegmental filling defects. Mediastinum/Nodes: No mediastinal hemorrhage. Mediastinal lymph nodes are present. Gastric tube within the esophagus. Lungs/Pleura: Ground-glass opacities developing through the the bilateral lungs. Developing interlobular septal thickening. Atelectasis of the medial segment left lower lobe. Small low-density left pleural effusion trace right-sided pleural effusion and associated atelectasis. No pneumothorax. Right upper extremity PICC appears to terminate superior vena cava. Review of the MIP images confirms the above findings. CTA ABDOMEN AND PELVIS FINDINGS VASCULAR Aorta: Re- demonstration of dissection flap of the abdominal aorta. No aneurysm.  No periaortic fluid of the abdomen. The true lumen is compressed throughout the abdomen. True lumen contributes to celiac artery origin, superior mesenteric artery origin, and also continues across the origin of the inferior mesenteric artery. The false lumen contributes to perfusion of the right renal artery. The lateral margin of the dissection flap involves the origin of 2 left renal arteries both superior and inferior. True lumen is decompressed in the inferior aorta extending into the bilateral iliac arteries. The bilateral common iliac arteries appear perfused from the false lumen bilaterally. False lumen extends in the bilateral external iliac artery. Likely re- entry tear of distal external iliac arteries bilaterally, on the right image 173, on the left image 171. Dissection flap extends into the bilateral hypogastric arteries which are partially patent with decreased flow. Celiac: Origin  of the celiac artery originates from the true lumen which is decompressed. Celiac artery remains patent at this time  including the branch vessels. Typical branch pattern of splenic artery, left gastric artery, common hepatic artery. SMA: Superior mesenteric artery origin originates from the true lumen which is compressed. SMA remains patent at this time. Renals: Right renal artery originates from the false lumen. Right renal artery is perfusing at this time with uniform perfusion of the right kidney. There are 2 left renal arteries, superior and inferior. Both arteries originate near the margin of the dissection flap. The superior left renal artery is partially filling, at the in flexion point of the dissection flap, perfusing superior kidney. The inferior left renal artery appears thrombosed, new from the comparison with enlarging left renal infarction, predominantly of the lower pole. IMA: Origin of the inferior mesenteric artery is thrombosed given the collapsed true lumen at this level. There is re- constitution of the distal inferior mesenteric artery via collateral flow. Right lower extremity: False lumen perfuses the right common iliac artery with collapse of the true lumen. Dissection flap extends into the hypogastric artery, with the distal pelvic branch is partially opacifying. External iliac artery is perfusing from the false lumen, with unremarkable appearance of the distal external iliac artery, common femoral artery, profunda femoris, SFA. There is the appearance of a re- entry tear in the mid right external iliac artery. Left lower extremity: False lumen perfuses the left common iliac artery with collapse of the true lumen. Dissection extends into the hypogastric artery which is partially perfused with opacification of pelvic vessels. Distal external iliac artery an the common femoral artery unremarkable, with patent proximal femoral vessels. There is the appearance of a re- entry tear in the mid left external iliac artery. Veins: Unremarkable appearance of the venous system. Review of the MIP images confirms the  above findings. NON-VASCULAR Hepatobiliary: Unremarkable appearance of the liver. Cholecystectomy Pancreas: Unremarkable appearance of the pancreas. No pericholecystic fluid or inflammatory changes. Unremarkable ductal system. Spleen: Unremarkable. Adrenals/Urinary Tract: Right adrenal gland unremarkable. Nodule of the left adrenal gland which measures 2.7 cm. Right: Right-sided kidney perfuses uniformly with no hydronephrosis. Left: Progressive left renal infarct with small portion of the superior kidney perfusing. The remainder of the left kidney is hypoperfused, progressed from the comparison. No hydronephrosis. Urinary catheter within the urinary bladder. Stomach/Bowel: Unremarkable appearance of stomach with gastric tube in place. Small bowel borderline dilated and fluid-filled. No transition point. The wall of the small bowel relatively uniformly thin and enhancing, although the study is timed for the arterial phase. No focal wall thickening. No abnormally distended colon. No transition point. Fluid filled colon. No focal wall thickening or pericolonic inflammatory changes. Lymphatic: Multiple lymph nodes in the para-aortic nodal station, none of which are enlarged. Mesenteric: No free fluid or air. No adenopathy. Reproductive: Hysterectomy Other: No hernia. Musculoskeletal: No displaced fracture. Degenerative changes of the spine. 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 Only 1 possible fenestration is identified, in the distal thoracic aorta above the aortic hiatus. There is enlarging diameter of the distal aortic arch with associated inflammatory changes, measuring approximately 3.5 on today's study compared to approximately 3.1 cm previously. The appearance is concerning for progression/expansion of intramural hematoma. Progressing left renal infarction, with near complete thrombosis of superior and inferior renal arteries,  both of which originate at the margin of the dissection flap. Bilateral common iliac arteries appear to be perfused  from the false lumen, with complete collapse of true lumen proximally. There does appear to be re- entry to the true lumen in the mid external iliac artery bilaterally, as there is unremarkable appearance of the proximal femoral vasculature including bilateral common iliac arteries. The 3 mesenteric vessels originate from the small true lumen, which is progressively compressed. Celiac artery and superior mesenteric artery remain patent, with occlusion at the origin of the inferior mesenteric artery. Three vessel arch, with all 3 branches remain patent. No evidence of extension of the dissection flap into the branch vessels. These preliminary results were discussed by telephone at the time of interpretation on 06/27/2016 at 12:41 pm with Dr. Curt Jews. Borderline dilated small bowel without transition point or obstruction. Findings may represent ileus and/or nonspecific enteritis. No focal wall thickening to suggest early ischemia, although the timing of this CTA is specific for arterial evaluation and not the bowel tissues. If ongoing concern for bowel ischemia, would repeat abd/pelvis contrast enhanced-CT portal venous phase. Similar appearance of mixed geographic ground-glass opacities of the bilateral lungs with mild interlobular septal thickening. Again, differential diagnosis includes pulmonary edema, ARDS, atypical infection. Signed, Dulcy Fanny. Earleen Newport, DO [facility] Radiology Electronically Signed   By: Corrie Mckusick D.O.   On: 06/27/2016 13:18   Ct Angio Chest/abd/pel For Dissection W And/or W/wo  Result Date: 06/20/2016 CLINICAL DATA:  Substernal chest pain radiating to back EXAM: CT ANGIOGRAPHY CHEST, ABDOMEN AND PELVIS TECHNIQUE: Multidetector CT imaging through the chest, abdomen and pelvis was performed using the standard protocol during  bolus administration of intravenous contrast. Multiplanar reconstructed images and MIPs were obtained and reviewed to evaluate the vascular anatomy. CONTRAST:  100 cc Isovue 370 IV COMPARISON:  CT abdomen and pelvis 10/27/2011 FINDINGS: CTA CHEST FINDINGS Cardiovascular: There is the type B aortic dissection beginning in the distal aortic arch just beyond the origin of the great vessels. The true lumen is compressed by the false lumen. No aneurysm. No pulmonary embolus. Heart is normal size. Mediastinum/Nodes: No mediastinal, hilar, or axillary adenopathy. Lungs/Pleura: Atelectasis or scarring in the lingula. Lungs otherwise clear. No effusions. Musculoskeletal: No acute bony abnormality. Review of the MIP images confirms the above findings. CTA ABDOMEN AND PELVIS FINDINGS VASCULAR Aorta: Dissection continues throughout the abdominal aorta with the celiac artery and superior mesenteric artery arising from the true lumen anteriorly. The dissection may extend into the left renal artery where there is only a small amount of blood flow noted in the proximal and mid left renal artery. Right renal artery appears to arise from the false lumen and is patent. The the true lumen appears thrombosed below the origin of the superior mesenteric artery. Inferior mesenteric artery arises from the thrombosed true lumen and appears occluded proximally, reconstitutes several cm from the origin. Celiac: Patent. SMA: Patent. Renals: As above. IMA: As above. Inflow: To the dissection continues into both common iliac arteries with thrombosed true lumens. The dissection appears to terminate at approximately the level of the common iliac bifurcation bilaterally. Veins: Grossly patent and unremarkable. Review of the MIP images confirms the above findings. NON-VASCULAR Hepatobiliary: Mild diffuse fatty infiltration. Prior cholecystectomy. Pancreas: No focal abnormality or ductal dilatation. Spleen: No focal abnormality.  Normal size.  Adrenals/Urinary Tract: Nodules in the left adrenal gland are low-density on the precontrast imaging compatible with small adenomas. There are areas of non perfusion noted in the mid and lower poles of the left kidney compatible with infarction, likely related to the  involvement of the left renal artery by dissection. Urinary bladder unremarkable. Stomach/Bowel: Stomach, large and small bowel grossly unremarkable. Lymphatic: No adenopathy. Reproductive: Prior hysterectomy.  No adnexal masses. Other: No free fluid or free air. Musculoskeletal: No acute bony abnormality. Review of the MIP images confirms the above findings. 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 This involves the descending thoracic aorta, abdominal aorta and iliac vessels. The true lumen is compressed by the larger false lumen, and is thrombosed below the SMA/renal artery origins. The dissection appears to extend into the left renal artery with partial occlusion and areas of infarct in the mid and lower pole of the left kidney. Fatty infiltration of the liver. Critical Value/emergent results were called by telephone at the time of interpretation on 06/20/2016 at 10:13 am to Dr. Fredia Sorrow , who verbally acknowledged these results. Electronically Signed   By: Rolm Baptise M.D.   On: 06/20/2016 10:15   Anti-infectives: Anti-infectives    Start     Dose/Rate Route Frequency Ordered Stop   06/26/16 0830  meropenem (MERREM) 1 g in sodium chloride 0.9 % 100 mL IVPB  Status:  Discontinued     1 g 200 mL/hr over 30 Minutes Intravenous Every 8 hours 06/26/16 0810 07/06/16 0804   06/22/16 1800  clindamycin (CLEOCIN) IVPB 600 mg  Status:  Discontinued     600 mg 100 mL/hr over 30 Minutes Intravenous Every 8 hours 06/22/16 1723 06/26/16 0810   06/22/16 1000  azithromycin (ZITHROMAX) 250 mg in dextrose 5 % 125 mL IVPB  Status:  Discontinued     250 mg 125 mL/hr over 60  Minutes Intravenous Every 24 hours 06/22/16 0826 06/27/16 1019   06/22/16 0900  levofloxacin (LEVAQUIN) IVPB 500 mg  Status:  Discontinued     500 mg 100 mL/hr over 60 Minutes Intravenous Every 24 hours 06/22/16 0759 06/22/16 0824      Assessment/Plan: s/p * No surgery found * Insinuated improvement from a symptomatic standpoint. His had several liquid bowel movements. Discussed with Dr. Hulen Skains. Agree with trying to advance diet again.   LOS: 17 days   EarlySherren Mocha 07/07/2016, 8:44 AM

## 2016-07-08 ENCOUNTER — Inpatient Hospital Stay (HOSPITAL_COMMUNITY): Payer: BLUE CROSS/BLUE SHIELD

## 2016-07-08 LAB — GLUCOSE, CAPILLARY
Glucose-Capillary: 170 mg/dL — ABNORMAL HIGH (ref 65–99)
Glucose-Capillary: 143 mg/dL — ABNORMAL HIGH (ref 65–99)
Glucose-Capillary: 121 mg/dL — ABNORMAL HIGH (ref 65–99)
Glucose-Capillary: 123 mg/dL — ABNORMAL HIGH (ref 65–99)
Glucose-Capillary: 145 mg/dL — ABNORMAL HIGH (ref 65–99)

## 2016-07-08 LAB — CBC
HCT: 31.4 % — ABNORMAL LOW (ref 36.0–46.0)
Hemoglobin: 9.7 g/dL — ABNORMAL LOW (ref 12.0–15.0)
MCH: 32.2 pg (ref 26.0–34.0)
MCHC: 30.9 g/dL (ref 30.0–36.0)
MCV: 104.3 fL — ABNORMAL HIGH (ref 78.0–100.0)
Platelets: 256 10*3/uL (ref 150–400)
RBC: 3.01 MIL/uL — ABNORMAL LOW (ref 3.87–5.11)
RDW: 13.7 % (ref 11.5–15.5)
WBC: 11.6 10*3/uL — ABNORMAL HIGH (ref 4.0–10.5)

## 2016-07-08 LAB — BASIC METABOLIC PANEL
Anion gap: 11 (ref 5–15)
BUN: 39 mg/dL — ABNORMAL HIGH (ref 6–20)
CO2: 26 mmol/L (ref 22–32)
Calcium: 8.3 mg/dL — ABNORMAL LOW (ref 8.9–10.3)
Chloride: 101 mmol/L (ref 101–111)
Creatinine, Ser: 1.18 mg/dL — ABNORMAL HIGH (ref 0.44–1.00)
GFR calc Af Amer: 57 mL/min — ABNORMAL LOW (ref >60)
GFR calc non Af Amer: 49 mL/min — ABNORMAL LOW (ref >60)
Glucose, Bld: 165 mg/dL — ABNORMAL HIGH (ref 65–99)
Potassium: 3.1 mmol/L — ABNORMAL LOW (ref 3.5–5.1)
Sodium: 138 mmol/L (ref 135–145)

## 2016-07-08 LAB — MAGNESIUM: Magnesium: 2 mg/dL (ref 1.7–2.4)

## 2016-07-08 MED ORDER — FAT EMULSION 20 % IV EMUL
240.0000 mL | INTRAVENOUS | Status: AC
  Administered 2016-07-08: 240 mL via INTRAVENOUS
  Filled 2016-07-08: qty 250

## 2016-07-08 MED ORDER — TRACE MINERALS CR-CU-MN-SE-ZN 10-1000-500-60 MCG/ML IV SOLN
INTRAVENOUS | Status: AC
  Administered 2016-07-08: 18:00:00 via INTRAVENOUS
  Filled 2016-07-08: qty 1992

## 2016-07-08 MED ORDER — POTASSIUM CHLORIDE 10 MEQ/50ML IV SOLN
10.0000 meq | INTRAVENOUS | Status: AC
  Administered 2016-07-08 (×4): 10 meq via INTRAVENOUS
  Filled 2016-07-08: qty 50

## 2016-07-08 NOTE — Progress Notes (Signed)
Subjective: Interval History: none.. Had NG replaced with some nausea and also with air-fluid levels on CT scan. Denies abdominal pain  Objective: Vital signs in last 24 hours: Temp:  [98 F (36.7 C)-99.1 F (37.3 C)] 98.6 F (37 C) (06/06 0859) Pulse Rate:  [85-101] 85 (06/06 0800) Resp:  [16-22] 17 (06/06 0800) BP: (117-141)/(45-97) 138/57 (06/06 0800) SpO2:  [90 %-97 %] 95 % (06/06 0852) Weight:  [174 lb 13.2 oz (79.3 kg)] 174 lb 13.2 oz (79.3 kg) (06/06 0500)  Intake/Output from previous day: 06/05 0701 - 06/06 0700 In: 2480.3 [I.V.:2480.3] Out: 2698 [Urine:1795; Emesis/NG output:900; Stool:3] Intake/Output this shift: Total I/O In: 93 [I.V.:93] Out: -   Abdomen soft and nontender. Moderate distention.  Lab Results:  Recent Labs  07/07/16 0340 07/08/16 0430  WBC 15.5* 11.6*  HGB 9.9* 9.7*  HCT 32.0* 31.4*  PLT 291 256   BMET  Recent Labs  07/07/16 0340 07/08/16 0430  NA 137 138  K 3.8 3.1*  CL 102 101  CO2 25 26  GLUCOSE 182* 165*  BUN 48* 39*  CREATININE 1.32* 1.18*  CALCIUM 8.3* 8.3*    Studies/Results: Ct Abdomen Pelvis Wo Contrast  Result Date: 06/26/2016 CLINICAL DATA:  Shortness of breath EXAM: CT CHEST, ABDOMEN AND PELVIS WITHOUT CONTRAST TECHNIQUE: Multidetector CT imaging of the chest, abdomen and pelvis was performed following the standard protocol without IV contrast. COMPARISON:  06/20/2016 chest CT FINDINGS: CT CHEST FINDINGS Cardiovascular: Normal heart size. No pericardial effusion. 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 Mild haziness around the upper descending segment is likely stable. Stable maximal diameter of 33 mm at this level. No intramural hematoma. Intermittently seen displaced intimal calcification. Mediastinum/Nodes: Negative for adenopathy. Right upper extremity PICC with tip at the SVC level. Lungs/Pleura: Multi segment atelectasis, worse on the left. Small pleural effusions. There is patchy  ground-glass airspace density in the bilateral lungs without gradient. Airways are clear and there is no septal thickening. Musculoskeletal: No acute or aggressive finding. CT ABDOMEN PELVIS FINDINGS Hepatobiliary: Hepatic steatosis.Cholecystectomy with normal common bile duct diameter. Pancreas: Unremarkable. Spleen: Unremarkable. 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 The larger measures up to 27 mm and is indeterminate by densitometry, but left adrenal mass has been noted since at least 2006 abdominal CT report, images not available. Left renal cyst. No hydronephrosis or urolithiasis. 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 Negative decompressed urinary bladder. Stomach/Bowel: There is diffuse small bowel distention and fluid filling with mildly hazy mesenteries. Similar features in the colon proximally and at the transverse segment. No pneumatosis or perforation is noted. Nasogastric tube tip is at the pylorus. Vascular/Lymphatic: There is intimal flap displacement from known dissection, morphology appearing similar to prior. No mass or adenopathy. Reproductive:Hysterectomy.  Unremarkable ovaries. Other: No ascites or pneumoperitoneum.  Anasarca. Musculoskeletal: No acute abnormalities. These results were called by telephone at the time of interpretation on 06/26/2016 at 2:05 pm to Dr. Ivin Poot , who verbally acknowledged these results. IMPRESSION: 1. Diffuse airspace disease. Pattern can be seen with noncardiogenic edema (ARDS), inflammatory pneumonitis, or atypical infection. Multi segment atelectasis at the bases and small pleural effusions. 2. Diffuse small bowel and colonic distention with fluid levels as seen with ileus. Question underlying bowel ischemia given the patient's known aortic dissection with proximal IMA occlusion and SMA flow via the narrow true lumen. 3. Incidental findings noted  above.  Electronically Signed   By: Monte Fantasia M.D.   On: 06/26/2016 14:05   Ct Chest Wo Contrast  Result Date: 06/26/2016 CLINICAL DATA:  Shortness of breath EXAM: CT CHEST, ABDOMEN AND PELVIS WITHOUT CONTRAST TECHNIQUE: Multidetector CT imaging of the chest, abdomen and pelvis was performed following the standard protocol without IV contrast. COMPARISON:  06/20/2016 chest CT FINDINGS: CT CHEST FINDINGS Cardiovascular: Normal heart size. No pericardial effusion. 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 Mild haziness around the upper descending segment is likely stable. Stable maximal diameter of 33 mm at this level. No intramural hematoma. Intermittently seen displaced intimal calcification. Mediastinum/Nodes: Negative for adenopathy. Right upper extremity PICC with tip at the SVC level. Lungs/Pleura: Multi segment atelectasis, worse on the left. Small pleural effusions. There is patchy ground-glass airspace density in the bilateral lungs without gradient. Airways are clear and there is no septal thickening. Musculoskeletal: No acute or aggressive finding. CT ABDOMEN PELVIS FINDINGS Hepatobiliary: Hepatic steatosis.Cholecystectomy with normal common bile duct diameter. Pancreas: Unremarkable. Spleen: Unremarkable. 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 The larger measures up to 27 mm and is indeterminate by densitometry, but left adrenal mass has been noted since at least 2006 abdominal CT report, images not available. Left renal cyst. No hydronephrosis or urolithiasis. 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 Negative decompressed urinary bladder. Stomach/Bowel: There is diffuse small bowel distention and fluid filling with mildly hazy mesenteries. Similar features in the colon proximally and at the transverse segment. No pneumatosis or perforation is noted. Nasogastric tube tip is  at the pylorus. Vascular/Lymphatic: There is intimal flap displacement from known dissection, morphology appearing similar to prior. No mass or adenopathy. Reproductive:Hysterectomy.  Unremarkable ovaries. Other: No ascites or pneumoperitoneum.  Anasarca. Musculoskeletal: No acute abnormalities. These results were called by telephone at the time of interpretation on 06/26/2016 at 2:05 pm to Dr. Ivin Poot , who verbally acknowledged these results. IMPRESSION: 1. Diffuse airspace disease. Pattern can be seen with noncardiogenic edema (ARDS), inflammatory pneumonitis, or atypical infection. Multi segment atelectasis at the bases and small pleural effusions. 2. Diffuse small bowel and colonic distention with fluid levels as seen with ileus. Question underlying bowel ischemia given the patient's known aortic dissection with proximal IMA occlusion and SMA flow via the narrow true lumen. 3. Incidental findings noted above. Electronically Signed   By: Monte Fantasia M.D.   On: 06/26/2016 14:05   Ct Abdomen Pelvis W Contrast  Result Date: 07/07/2016 CLINICAL DATA:  61 year old female with abdominal pain and distension greater on the right. Subsequent encounter. EXAM: CT ABDOMEN AND PELVIS WITH CONTRAST TECHNIQUE: Multidetector CT imaging of the abdomen and pelvis was performed using the standard protocol following bolus administration of intravenous contrast. CONTRAST:  75 cc Isovue 300. COMPARISON:  07/06/2016 plain film exam. 06/27/2016 CT angiogram chest, abdomen and pelvis. 10/27/2011 FINDINGS: Lower chest: Basilar atelectasis greatest medial left lung base. Heart top-normal size with aortic/mitral valve calcification. Hepatobiliary: Mild fatty infiltration of the liver without worrisome hepatic lesion. Post cholecystectomy. Pancreas: No mass or inflammation.  No duct dilation. Spleen: No mass or enlargement. Adrenals/Urinary Tract: Prominent infarct left kidney. Left renal cyst. Nodularity adrenal glands  greater on the left has changed slightly since 2013 with largest left adrenal nodule currently measuring 2.6 x 2.5 x 1.9 cm versus on 2013 exam 2.4 x 2.3 x 1.9 cm Stomach/Bowel: Fluid and gas-filled prominent size  small bowel without point of transition noted. Fluid-filled featureless colon. Minimal amount of fluid adjacent to small bowel loops and minimal hazy infiltration of fat planes surrounding the descending colon. No pneumatosis or free intraperitoneal air. Findings may reflect changes of enterocolitis of indeterminate etiology whether ischemic, inflammatory or infectious. No pneumatosis or free intraperitoneal air. 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 Dissection extends into iliac arteries. Inferior mesenteric artery may fill in a retrograde fashion. Appearance unchanged from recent CT angiogram. Scattered normal size lymph nodes. Reproductive: No worrisome abnormality. Foley catheter with decompressed urinary bladder. Other: No bowel containing hernia. Musculoskeletal: No acute osseous abnormality. IMPRESSION: Fluid and gas-filled prominent size small bowel without worrisome point of transition. Fluid-filled featureless colon. Minimal amount of fluid adjacent to small bowel loops and minimal hazy infiltration of fat planes surrounding the descending colon. No pneumatosis or free intraperitoneal air. Findings may reflect changes of enterocolitis of indeterminate etiology whether ischemic, inflammatory or infectious. No pneumatosis or free intraperitoneal air. 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 Infarction majority of the left kidney. Adrenal gland nodularity greater on left stable since recent exams and minimally changed since 2013 as detailed above. Electronically Signed   By: Genia Del M.D.   On: 07/07/2016  14:22   Dg Chest Port 1 View  Result Date: 07/08/2016 CLINICAL DATA:  Shortness of Breath EXAM: PORTABLE CHEST 1 VIEW COMPARISON:  July 04, 2016 FINDINGS: Central catheter tip is in the superior vena cava, stable. Nasogastric tube tip and side port are in the stomach. No pneumothorax. There is persistent airspace consolidation in the left lower lobe with small left pleural effusion. Right lung is clear. Heart is upper normal in size with pulmonary vascularity within normal limits. No adenopathy. No evident bone lesions. IMPRESSION: Tube and catheter positions as described without pneumothorax. Persistent left lower lobe consolidation with small left pleural effusion. No new parenchymal lung opacity. Stable cardiac silhouette. Electronically Signed   By: Lowella Grip III M.D.   On: 07/08/2016 08:00   Dg Chest Port 1 View  Result Date: 07/04/2016 CLINICAL DATA:  Followup shortness of breath.  Aortic dissection. EXAM: PORTABLE CHEST 1 VIEW COMPARISON:  07/01/2016 FINDINGS: Nasogastric tube enters the stomach. Right arm PICC tip is in the SVC above right atrium. Less pulmonary edema. Persistent left effusion. Persistent volume loss in both lower lobes left worse than right. Chronic prominence of the aortic shadow consistent with the history of dissection. IMPRESSION: Less pulmonary edema. Persistent left effusion. Persistent atelectasis in both lower lobes left more than right. Persistent prominent aortic/mediastinal shadow. Electronically Signed   By: Nelson Chimes M.D.   On: 07/04/2016 08:11   Dg Chest Port 1 View  Result Date: 07/01/2016 CLINICAL DATA:  Shortness of breath common pneumonia, thoracic aortic dissection EXAM: PORTABLE CHEST 1 VIEW COMPARISON:  Portable chest x-ray of Jun 30, 2016 FINDINGS: The lungs are adequately inflated. The pulmonary interstitial markings are less prominent. The retrocardiac region remains dense and there is remains partial obscuration of the left hemidiaphragm. The  heart is top-normal in size. The mediastinum is normal in width. The esophagogastric tube tip projects below the inferior margin of the image. The right PICC line tip projects over the midportion of the SVC. IMPRESSION: Slight interval improvement in the appearance of the pulmonary interstitium suggests decreasing interstitial edema. There is persistent left  lower lobe atelectasis or pneumonia. Electronically Signed   By: David  Martinique M.D.   On: 07/01/2016 07:35   Dg Chest Port 1 View  Result Date: 06/30/2016 CLINICAL DATA:  Breath, pneumonia, aortic aneurysm, GERD, hypertension, diabetes mellitus EXAM: PORTABLE CHEST 1 VIEW COMPARISON:  Portable exam 0633 hours compared to 06/29/2016 FINDINGS: Nasogastric tube coiled in proximal stomach. RIGHT arm PICC line tip projects over SVC above cavoatrial junction. Upper normal heart size. Mediastinal contours normal. Mild pulmonary vascular congestion. Persistent diffuse BILATERAL infiltrates. Atelectasis versus consolidation LEFT lower lobe. No pneumothorax. IMPRESSION: Persistent mild diffuse interstitial infiltrates with atelectasis versus consolidation in LEFT lower lobe. Electronically Signed   By: Lavonia Dana M.D.   On: 06/30/2016 06:58   Dg Chest Port 1 View  Result Date: 06/29/2016 CLINICAL DATA:  Shortness of Breath EXAM: PORTABLE CHEST 1 VIEW COMPARISON:  06/28/2016 FINDINGS: Nasogastric catheter is noted coiled within the stomach. Right-sided PICC line is seen in the distal superior vena cava. Cardiac shadow is mildly enlarged but stable. Bilateral vascular congestion and edema is seen stable from prior exam. Some atelectasis remains in the left retrocardiac region. IMPRESSION: No significant interval change from the prior exam. Electronically Signed   By: Inez Catalina M.D.   On: 06/29/2016 07:36   Dg Chest Port 1 View  Result Date: 06/28/2016 CLINICAL DATA:  Aortic dissection. EXAM: PORTABLE CHEST 1 VIEW COMPARISON:  06/27/2016. FINDINGS: The heart  is enlarged. There is moderate vascular congestion. Prominence of the aortic contour reflecting the type B dissection. LEFT lower lobe atelectasis with effusion. IMPRESSION: Cardiomegaly. Aortic dissection. Vascular congestion. LEFT lower lobe atelectasis with effusion. Electronically Signed   By: Staci Righter M.D.   On: 06/28/2016 07:13   Dg Chest Port 1 View  Result Date: 06/27/2016 CLINICAL DATA:  ARDS. EXAM: PORTABLE CHEST 1 VIEW COMPARISON:  Jun 26, 2016 FINDINGS: The right PICC line is stable. The NG tube terminates below today's study. Diffuse bilateral primarily interstitial opacities are mildly more homogeneous in less patchy in appearance. More focal opacity in the left base is stable. No other changes. IMPRESSION: 1. Diffuse bilateral primarily interstitial opacities suggesting edema remain. More focal opacity in the left base is stable to mildly improved. Electronically Signed   By: Dorise Bullion III M.D   On: 06/27/2016 07:21   Dg Chest Port 1 View  Result Date: 06/26/2016 CLINICAL DATA:  Shortness of breath. EXAM: PORTABLE CHEST 1 VIEW COMPARISON:  Radiograph of Jun 25, 2016. FINDINGS: Stable cardiomegaly. Increased bilateral diffuse interstitial and basilar opacities are noted concerning for worsening edema. No pneumothorax is noted. Mild left pleural effusion is noted with associated atelectasis or infiltrate. Right-sided PICC line is unchanged in position. Nasogastric tube is unchanged in position. Bony thorax is unremarkable. IMPRESSION: Increased bilateral diffuse interstitial densities consistent with edema. Mild left pleural effusion is noted with associated atelectasis or infiltrate. Electronically Signed   By: Marijo Conception, M.D.   On: 06/26/2016 07:40   Dg Chest Port 1 View  Result Date: 06/25/2016 CLINICAL DATA:  Shortness of Breath EXAM: PORTABLE CHEST 1 VIEW COMPARISON:  06/24/2016 FINDINGS: Cardiac shadow is prominent but stable. Right-sided PICC line and nasogastric  catheter are again seen and stable. Bibasilar atelectatic changes are noted worse on the left than the right and increased from the prior exam. Small left pleural effusion is noted as well. Mild vascular congestion is seen. IMPRESSION: Increasing bibasilar infiltrates left greater than right. Mild vascular congestion is noted. The Electronically Signed  By: Inez Catalina M.D.   On: 06/25/2016 07:54   Dg Chest Port 1 View  Result Date: 06/24/2016 CLINICAL DATA:  Shortness of Breath EXAM: PORTABLE CHEST 1 VIEW COMPARISON:  06/23/2016 FINDINGS: Right-sided PICC line is again noted at the cavoatrial junction. Nasogastric catheter is noted in satisfactory position through the overall inspiratory effort is decreased from the prior exam. Mild left basilar atelectasis remains. No other focal abnormality is noted. IMPRESSION: Stable left basilar atelectasis. Electronically Signed   By: Inez Catalina M.D.   On: 06/24/2016 08:07   Dg Chest Port 1 View  Result Date: 06/23/2016 CLINICAL DATA:  61 year old female with shortness of breath and abdominal pain. 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 Being treated conservatively at this time, including with bowel rest. EXAM: PORTABLE CHEST 1 VIEW COMPARISON:  Chest radiographs 06/22/2016 and earlier. CTA 06/20/2016 FINDINGS: Portable AP semi upright view at 0754 hours. Stable enteric tube which courses to the abdomen. Stable right PICC line. Mildly larger lung volumes and decreased streaky bibasilar opacity. Mild residual. No pneumothorax or pulmonary edema. No pleural effusion or consolidation. Stable cardiac size and mediastinal contours. IMPRESSION: 1.  Stable lines and tubes. 2. Mildly improved lung volumes and regressed bibasilar opacity favored to be atelectasis. Electronically Signed   By: Genevie Ann M.D.   On: 06/23/2016 08:19   Dg Chest Port 1  View  Result Date: 06/22/2016 CLINICAL DATA:  61 year old female with history of abdominal aortic aneurysm. Shortness of breath and vomiting. EXAM: PORTABLE CHEST 1 VIEW COMPARISON:  Chest x-ray 06/21/2016. FINDINGS: There is a right upper extremity PICC with tip terminating in the superior cavoatrial junction. A nasogastric tube is seen extending into the stomach, however, the tip of the nasogastric tube extends below the lower margin of the image. Lung volumes are low. Linear bibasilar opacities favored to reflect areas of subsegmental atelectasis, however, underlying airspace consolidation from infection or aspiration is not excluded. No pleural effusions. No evidence of pulmonary edema. Heart size is normal. Upper mediastinal contours are within normal limits. IMPRESSION: 1. Support apparatus, as above. 2. Low lung volumes with bibasilar areas of atelectasis and/or airspace consolidation. Electronically Signed   By: Vinnie Langton M.D.   On: 06/22/2016 07:30   Dg Chest Port 1 View  Result Date: 06/21/2016 CLINICAL DATA:  Aortic dissection EXAM: PORTABLE CHEST 1 VIEW COMPARISON:  06/20/2016 FINDINGS: Bibasilar atelectasis. Heart is normal size. No effusions or acute bony abnormality. IMPRESSION: Bibasilar atelectasis. Electronically Signed   By: Rolm Baptise M.D.   On: 06/21/2016 07:18   Dg Chest Port 1 View  Result Date: 06/20/2016 CLINICAL DATA:  Hypoxia, unresponsive EXAM: PORTABLE CHEST 1 VIEW COMPARISON:  CTA chest dated 06/20/2016 FINDINGS: Lungs are essentially clear. No focal consolidation. No pleural effusion or pneumothorax. The heart is top-normal in size. IMPRESSION: No evidence of acute cardiopulmonary disease. Electronically Signed   By: Julian Hy M.D.   On: 06/20/2016 17:01   Dg Chest Port 1 View  Result Date: 06/20/2016 CLINICAL DATA:  Sudden onset sharp substernal chest pain that radiates to the back. Nausea, dizziness and shortness of breath. EXAM: PORTABLE CHEST 1 VIEW  COMPARISON:  None. FINDINGS: Trachea is midline. Heart size is accentuated by AP supine technique. Probable mild scarring in the right middle lobe and lingula. Lungs are otherwise clear. No pleural fluid. IMPRESSION: No acute findings. Electronically Signed   By: Rip Harbour  Blietz M.D.   On: 06/20/2016 09:56   Dg Abd Portable 1v  Result Date: 07/08/2016 CLINICAL DATA:  Abdominal distention EXAM: PORTABLE ABDOMEN - 1 VIEW COMPARISON:  July 07, 2016 FINDINGS: There may be slightly less bowel dilatation compared to 1 day prior. No air-fluid levels evident. No free air. Nasogastric tube tip and side port remain in stomach. IMPRESSION: Probable ileus with marginally less bowel dilatation compared to 1 day prior. No free air. Nasogastric tube tip and side port in stomach. Electronically Signed   By: Lowella Grip III M.D.   On: 07/08/2016 08:00   Dg Abd Portable 1v  Result Date: 07/07/2016 CLINICAL DATA:  Feeding tube placement. EXAM: PORTABLE ABDOMEN - 1 VIEW COMPARISON:  07/06/2016 FINDINGS: Interval placement of enteric tube which enters the stomach where it is coiled once and has tip and side-port over the gastric fundus just below the left hemidiaphragm. Air-filled loops of large and small bowel with dilated small bowel loop in the left abdomen measuring 5.7 cm in diameter as findings are not significantly changed. Remainder of the exam is unchanged. IMPRESSION: Persistent air-filled dilated small bowel with air throughout the colon. Nasogastric tube coiled once within the stomach with tip and side-port over the gastric fundus just below the left hemidiaphragm. Electronically Signed   By: Marin Olp M.D.   On: 07/07/2016 19:16   Dg Abd Portable 1v  Result Date: 07/06/2016 CLINICAL DATA:  Multiple episodes of vomiting, abdominal pain and distention. EXAM: PORTABLE ABDOMEN - 1 VIEW COMPARISON:  07/04/2016 FINDINGS: There is persistent dilation and wall edema of small bowel loops, with featureless  appearance of some of the visualized small bowel loops. No radiographic evidence of organomegaly or free gas on this limited supine radiograph. IMPRESSION: Persistent dilation/wall edema and featureless appearance of small bowel loops in a nonspecific pattern. These findings may be seen with ileus, incomplete small bowel obstruction or enteritis. Ischemic enteritis continues to be of consideration. Electronically Signed   By: Fidela Salisbury M.D.   On: 07/06/2016 16:14   Dg Abd Portable 1v  Result Date: 07/04/2016 CLINICAL DATA:  Aortic dissection. EXAM: PORTABLE ABDOMEN - 1 VIEW COMPARISON:  07/02/2016 FINDINGS: Nasogastric tube tip in the region of the antrum/pylorus. Persistent group of dilated small bowel loops in the left mid abdomen with possible bowel edema. Possibility of bowel ischemia certainly does exist. Similar appearance to the study of 07/02/2016. IMPRESSION: Nasogastric tube in place. Otherwise similar appearance with abnormal small bowel pattern in the left central abdomen which could go along with partial small bowel obstruction or a focal enteritis including ischemic. Electronically Signed   By: Nelson Chimes M.D.   On: 07/04/2016 08:13   Dg Abd Portable 1v  Result Date: 07/02/2016 CLINICAL DATA:  Nasogastric tube removed.  Acute abdominal pain. EXAM: PORTABLE ABDOMEN - 1 VIEW COMPARISON:  06/27/2016 FINDINGS: Nasogastric tube is been removed. There is a group of dilated fluid and air-filled loops of small intestine in the left abdomen with apparent wall thickening. This could be due to partial obstruction or enteritis, including ischemic bowel insult. IMPRESSION: Nasogastric tube removed. Abnormal dilated small intestine in the left central abdomen. Differential diagnosis partial small bowel obstruction versus enteritis, including ischemic. Electronically Signed   By: Nelson Chimes M.D.   On: 07/02/2016 07:37   Dg Abd Portable 1v  Result Date: 06/27/2016 CLINICAL DATA:  Ileus. EXAM:  PORTABLE ABDOMEN - 1 VIEW COMPARISON:  Jun 26, 2016 FINDINGS: The NG tube terminates within the stomach.  The bowel gas pattern is unremarkable on today's study. No other acute abnormalities. IMPRESSION: The NG tube terminates within the stomach. The bowel gas pattern is unremarkable. Electronically Signed   By: Dorise Bullion III M.D   On: 06/27/2016 07:19   Dg Abd Portable 1v  Result Date: 06/26/2016 CLINICAL DATA:  Shortness of breath, abdominal pain EXAM: PORTABLE ABDOMEN - 1 VIEW COMPARISON:  06/25/2016 FINDINGS: NG tube tip is in the distal stomach. Prior cholecystectomy. Nonobstructive bowel gas pattern. No free air organomegaly. IMPRESSION: NG tube tip in the distal stomach.  No acute findings. Electronically Signed   By: Rolm Baptise M.D.   On: 06/26/2016 07:36   Dg Abd Portable 1v  Result Date: 06/25/2016 CLINICAL DATA:  Shortness of breath.  Abdominal pain. EXAM: PORTABLE ABDOMEN - 1 VIEW COMPARISON:  Jun 24, 2016 FINDINGS: The distal tip of the NG tube is near the gastric antrum. Mild opacity in left lung base will be better assessed on today's chest x-ray. No free air or portal venous gas identified although evaluation is limited on supine imaging. No bowel obstruction. IMPRESSION: No interval change or acute abnormality in the abdomen. Electronically Signed   By: Dorise Bullion III M.D   On: 06/25/2016 07:54   Dg Abd Portable 1v  Result Date: 06/24/2016 CLINICAL DATA:  Abdominal distention. EXAM: PORTABLE ABDOMEN - 1 VIEW COMPARISON:  06/23/2016 . FINDINGS: NG tube noted in stable position with tip in the stomach . Surgical clips right upper quadrant. Several nonspecific loops of air-filled small bowel again noted. Colonic gas pattern is normal. No free air. IMPRESSION: 1.  NG tube in stable position. 2. Several nonspecific loops of air-filled small bowel again noted. No evidence of progressive bowel distention. Colonic gas pattern normal. No free air. Electronically Signed   By: Marcello Moores   Register   On: 06/24/2016 08:06   Dg Abd Portable 1v  Result Date: 06/23/2016 CLINICAL DATA:  61 year old female with shortness of breath and abdominal pain. 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 Being treated conservatively at this time, including with bowel rest. EXAM: PORTABLE ABDOMEN - 1 VIEW COMPARISON:  Abdominal radiographs 06/22/2016. CTA 06/20/2016, and earlier. FINDINGS: Portable AP supine view at 0759 hours. Stable NG tube, side hole to level of the gastric body and tip at the level of the gastric antrum. Stable cholecystectomy clips. Stable bowel gas pattern with several gas containing nondilated small and large bowel loops. No definite pneumoperitoneum on this supine view. Abdominal and pelvic visceral contours appear stable. No acute osseous abnormality identified. IMPRESSION: 1. Stable NG tube position. 2. Stable, nonobstructed bowel-gas pattern. Electronically Signed   By: Genevie Ann M.D.   On: 06/23/2016 08:18   Dg Abd Portable 1v  Result Date: 06/22/2016 CLINICAL DATA:  Abdominal pain for few days with nausea and vomiting. EXAM: PORTABLE ABDOMEN - 1 VIEW COMPARISON:  06/22/2016 abdominal radiograph. FINDINGS: Enteric tube loops in the gastric fundus and terminates in the body of the stomach, without appreciable kink. Top-normal caliber small bowel loops throughout the abdomen. Minimal colonic stool. No evidence of pneumatosis or pneumoperitoneum. No radiopaque urolithiasis. Cholecystectomy clips are seen in the right upper quadrant of the abdomen. Patchy opacities at the lung bases. IMPRESSION: 1. Enteric tube loops in the gastric fundus and terminates in the body of the stomach without appreciable kink. 2. Top-normal caliber small bowel loops, unchanged. 3. Patchy bibasilar lung opacities, correlate  with chest radiograph. Electronically Signed   By: Ilona Sorrel M.D.   On:  06/22/2016 16:58   Dg Abd Portable 1v  Result Date: 06/22/2016 CLINICAL DATA:  Vomiting.  Shortness of breath.  AAA . EXAM: PORTABLE ABDOMEN - 1 VIEW COMPARISON:  06/21/2016. FINDINGS: Surgical clips right upper quadrant. NG tube noted with tip over the distal stomach. No bowel distention. No acute bony abnormality identified. Mild basilar atelectasis. IMPRESSION: NG tube noted with its tip over the distal stomach. No bowel distention or acute intra-abdominal abnormality identified. Electronically Signed   By: Marcello Moores  Register   On: 06/22/2016 07:30   Dg Abd Portable 1v  Result Date: 06/21/2016 CLINICAL DATA:  Feeding tube placement EXAM: PORTABLE ABDOMEN - 1 VIEW COMPARISON:  06/21/2016 FINDINGS: NG tube coils in the fundus of the stomach with the tip in the distal stomach. Decompression of the stomach. IMPRESSION: NG tube tip in the distal stomach. Electronically Signed   By: Rolm Baptise M.D.   On: 06/21/2016 15:46   Dg Abd Portable 1v  Result Date: 06/21/2016 CLINICAL DATA:  Ileus  Vomiting that started this AM per patient EXAM: PORTABLE ABDOMEN - 1 VIEW COMPARISON:  06/20/2016 FINDINGS: Gaseous distention of the stomach. Paucity of small bowel and colonic gas. Cholecystectomy clips. Linear scarring/ atelectasis in the lung bases. IMPRESSION: 1. Gaseous distention of the stomach. Electronically Signed   By: Lucrezia Europe M.D.   On: 06/21/2016 11:54   Dg Abd Portable 1v  Result Date: 06/20/2016 CLINICAL DATA:  Aortic dissection EXAM: PORTABLE ABDOMEN - 1 VIEW COMPARISON:  CT abdomen/ pelvis dated 06/20/2016 FINDINGS: Nonobstructive bowel gas pattern. Visualized osseous structures are within normal limits. IMPRESSION: Unremarkable abdominal radiograph. Electronically Signed   By: Julian Hy M.D.   On: 06/20/2016 17:02   Ct Angio Chest/abd/pel For Dissection W And/or W/wo  Result Date: 06/27/2016 CLINICAL DATA:  61 year old female with a history of type B dissection EXAM: CT ANGIOGRAPHY  CHEST, ABDOMEN AND PELVIS TECHNIQUE: Multidetector CT imaging through the chest, abdomen and pelvis was performed using the standard protocol during bolus administration of intravenous contrast. Multiplanar reconstructed images and MIPs were obtained and reviewed to evaluate the vascular anatomy. CONTRAST:  100 cc Isovue 370 COMPARISON:  CT 06/20/2016 FINDINGS: CTA CHEST FINDINGS Cardiovascular: Heart: Heart size unchanged. No pericardial fluid/ thickening. No significant coronary calcifications. Aorta: Re- demonstration of type B dissection with the entry tear appearing to originate just beyond the origin of the left subclavian artery. Branch vessels remain patent without extension of the dissection flap into the branch vessels. Caliber and contour of the ascending aorta unremarkable without evidence of retrograde extension. Greatest diameter of the ascending aorta 2.9 cm. Inflammatory changes surrounding the distal aortic arch in the proximal descending aorta. Greatest diameter of the distal aortic arch measures approximately 3.4 cm. Greatest diameter on the comparison CT approximately 3.0 cm. No aneurysm of the descending thoracic aorta with the greatest diameter of the false lumen measuring 2.7 cm. True lumen is compressed. There is a fenestration in the distal true lumen at the level of the diaphragm just above the aortic hiatus. Pulmonary arteries: No lobar, segmental, or proximal subsegmental filling defects. Mediastinum/Nodes: No mediastinal hemorrhage. Mediastinal lymph nodes are present. Gastric tube within the esophagus. Lungs/Pleura: Ground-glass opacities developing through the the bilateral lungs. Developing interlobular septal thickening. Atelectasis of the medial segment left lower lobe. Small low-density left pleural effusion trace right-sided pleural effusion and associated atelectasis. No pneumothorax. Right upper extremity PICC appears to  terminate superior vena cava. Review of the MIP images  confirms the above findings. CTA ABDOMEN AND PELVIS FINDINGS VASCULAR Aorta: Re- demonstration of dissection flap of the abdominal aorta. No aneurysm.  No periaortic fluid of the abdomen. The true lumen is compressed throughout the abdomen. True lumen contributes to celiac artery origin, superior mesenteric artery origin, and also continues across the origin of the inferior mesenteric artery. The false lumen contributes to perfusion of the right renal artery. The lateral margin of the dissection flap involves the origin of 2 left renal arteries both superior and inferior. True lumen is decompressed in the inferior aorta extending into the bilateral iliac arteries. The bilateral common iliac arteries appear perfused from the false lumen bilaterally. False lumen extends in the bilateral external iliac artery. Likely re- entry tear of distal external iliac arteries bilaterally, on the right image 173, on the left image 171. Dissection flap extends into the bilateral hypogastric arteries which are partially patent with decreased flow. Celiac: Origin of the celiac artery originates from the true lumen which is decompressed. Celiac artery remains patent at this time including the branch vessels. Typical branch pattern of splenic artery, left gastric artery, common hepatic artery. SMA: Superior mesenteric artery origin originates from the true lumen which is compressed. SMA remains patent at this time. Renals: Right renal artery originates from the false lumen. Right renal artery is perfusing at this time with uniform perfusion of the right kidney. There are 2 left renal arteries, superior and inferior. Both arteries originate near the margin of the dissection flap. The superior left renal artery is partially filling, at the in flexion point of the dissection flap, perfusing superior kidney. The inferior left renal artery appears thrombosed, new from the comparison with enlarging left renal infarction, predominantly of the  lower pole. IMA: Origin of the inferior mesenteric artery is thrombosed given the collapsed true lumen at this level. There is re- constitution of the distal inferior mesenteric artery via collateral flow. Right lower extremity: False lumen perfuses the right common iliac artery with collapse of the true lumen. Dissection flap extends into the hypogastric artery, with the distal pelvic branch is partially opacifying. External iliac artery is perfusing from the false lumen, with unremarkable appearance of the distal external iliac artery, common femoral artery, profunda femoris, SFA. There is the appearance of a re- entry tear in the mid right external iliac artery. Left lower extremity: False lumen perfuses the left common iliac artery with collapse of the true lumen. Dissection extends into the hypogastric artery which is partially perfused with opacification of pelvic vessels. Distal external iliac artery an the common femoral artery unremarkable, with patent proximal femoral vessels. There is the appearance of a re- entry tear in the mid left external iliac artery. Veins: Unremarkable appearance of the venous system. Review of the MIP images confirms the above findings. NON-VASCULAR Hepatobiliary: Unremarkable appearance of the liver. Cholecystectomy Pancreas: Unremarkable appearance of the pancreas. No pericholecystic fluid or inflammatory changes. Unremarkable ductal system. Spleen: Unremarkable. Adrenals/Urinary Tract: Right adrenal gland unremarkable. Nodule of the left adrenal gland which measures 2.7 cm. Right: Right-sided kidney perfuses uniformly with no hydronephrosis. Left: Progressive left renal infarct with small portion of the superior kidney perfusing. The remainder of the left kidney is hypoperfused, progressed from the comparison. No hydronephrosis. Urinary catheter within the urinary bladder. Stomach/Bowel: Unremarkable appearance of stomach with gastric tube in place. Small bowel borderline  dilated and fluid-filled. No transition point. The wall of the small bowel relatively  uniformly thin and enhancing, although the study is timed for the arterial phase. No focal wall thickening. No abnormally distended colon. No transition point. Fluid filled colon. No focal wall thickening or pericolonic inflammatory changes. Lymphatic: Multiple lymph nodes in the para-aortic nodal station, none of which are enlarged. Mesenteric: No free fluid or air. No adenopathy. Reproductive: Hysterectomy Other: No hernia. Musculoskeletal: No displaced fracture. Degenerative changes of the spine. 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 Only 1 possible fenestration is identified, in the distal thoracic aorta above the aortic hiatus. There is enlarging diameter of the distal aortic arch with associated inflammatory changes, measuring approximately 3.5 on today's study compared to approximately 3.1 cm previously. The appearance is concerning for progression/expansion of intramural hematoma. Progressing left renal infarction, with near complete thrombosis of superior and inferior renal arteries, both of which originate at the margin of the dissection flap. Bilateral common iliac arteries appear to be perfused from the false lumen, with complete collapse of true lumen proximally. There does appear to be re- entry to the true lumen in the mid external iliac artery bilaterally, as there is unremarkable appearance of the proximal femoral vasculature including bilateral common iliac arteries. The 3 mesenteric vessels originate from the small true lumen, which is progressively compressed. Celiac artery and superior mesenteric artery remain patent, with occlusion at the origin of the inferior mesenteric artery. Three vessel arch, with all 3 branches remain patent. No evidence of extension of the dissection flap into the branch vessels. These preliminary  results were discussed by telephone at the time of interpretation on 06/27/2016 at 12:41 pm with Dr. Curt Jews. Borderline dilated small bowel without transition point or obstruction. Findings may represent ileus and/or nonspecific enteritis. No focal wall thickening to suggest Yizel Canby ischemia, although the timing of this CTA is specific for arterial evaluation and not the bowel tissues. If ongoing concern for bowel ischemia, would repeat abd/pelvis contrast enhanced-CT portal venous phase. Similar appearance of mixed geographic ground-glass opacities of the bilateral lungs with mild interlobular septal thickening. Again, differential diagnosis includes pulmonary edema, ARDS, atypical infection. Signed, Dulcy Fanny. Earleen Newport, DO [facility] Radiology Electronically Signed   By: Corrie Mckusick D.O.   On: 06/27/2016 13:18   Ct Angio Chest/abd/pel For Dissection W And/or W/wo  Result Date: 06/20/2016 CLINICAL DATA:  Substernal chest pain radiating to back EXAM: CT ANGIOGRAPHY CHEST, ABDOMEN AND PELVIS TECHNIQUE: Multidetector CT imaging through the chest, abdomen and pelvis was performed using the standard protocol during bolus administration of intravenous contrast. Multiplanar reconstructed images and MIPs were obtained and reviewed to evaluate the vascular anatomy. CONTRAST:  100 cc Isovue 370 IV COMPARISON:  CT abdomen and pelvis 10/27/2011 FINDINGS: CTA CHEST FINDINGS Cardiovascular: There is the type B aortic dissection beginning in the distal aortic arch just beyond the origin of the great vessels. The true lumen is compressed by the false lumen. No aneurysm. No pulmonary embolus. Heart is normal size. Mediastinum/Nodes: No mediastinal, hilar, or axillary adenopathy. Lungs/Pleura: Atelectasis or scarring in the lingula. Lungs otherwise clear. No effusions. Musculoskeletal: No acute bony abnormality. Review of the MIP images confirms the above findings. CTA ABDOMEN AND  PELVIS FINDINGS VASCULAR Aorta: Dissection continues throughout the abdominal aorta with the celiac artery and superior mesenteric artery arising from the true lumen anteriorly. The dissection may extend into the left renal artery where there is only a small amount of blood flow noted in  the proximal and mid left renal artery. Right renal artery appears to arise from the false lumen and is patent. The the true lumen appears thrombosed below the origin of the superior mesenteric artery. Inferior mesenteric artery arises from the thrombosed true lumen and appears occluded proximally, reconstitutes several cm from the origin. Celiac: Patent. SMA: Patent. Renals: As above. IMA: As above. Inflow: To the dissection continues into both common iliac arteries with thrombosed true lumens. The dissection appears to terminate at approximately the level of the common iliac bifurcation bilaterally. Veins: Grossly patent and unremarkable. Review of the MIP images confirms the above findings. NON-VASCULAR Hepatobiliary: Mild diffuse fatty infiltration. Prior cholecystectomy. Pancreas: No focal abnormality or ductal dilatation. Spleen: No focal abnormality.  Normal size. Adrenals/Urinary Tract: Nodules in the left adrenal gland are low-density on the precontrast imaging compatible with small adenomas. There are areas of non perfusion noted in the mid and lower poles of the left kidney compatible with infarction, likely related to the involvement of the left renal artery by dissection. Urinary bladder unremarkable. Stomach/Bowel: Stomach, large and small bowel grossly unremarkable. Lymphatic: No adenopathy. Reproductive: Prior hysterectomy.  No adnexal masses. Other: No free fluid or free air. Musculoskeletal: No acute bony abnormality. Review of the MIP images confirms the above findings. 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 This involves the descending  thoracic aorta, abdominal aorta and iliac vessels. The true lumen is compressed by the larger false lumen, and is thrombosed below the SMA/renal artery origins. The dissection appears to extend into the left renal artery with partial occlusion and areas of infarct in the mid and lower pole of the left kidney. Fatty infiltration of the liver. Critical Value/emergent results were called by telephone at the time of interpretation on 06/20/2016 at 10:13 am to Dr. Fredia Sorrow , who verbally acknowledged these results. Electronically Signed   By: Rolm Baptise M.D.   On: 06/20/2016 10:15   Anti-infectives: Anti-infectives    Start     Dose/Rate Route Frequency Ordered Stop   06/26/16 0830  meropenem (MERREM) 1 g in sodium chloride 0.9 % 100 mL IVPB  Status:  Discontinued     1 g 200 mL/hr over 30 Minutes Intravenous Every 8 hours 06/26/16 0810 07/06/16 0804   06/22/16 1800  clindamycin (CLEOCIN) IVPB 600 mg  Status:  Discontinued     600 mg 100 mL/hr over 30 Minutes Intravenous Every 8 hours 06/22/16 1723 06/26/16 0810   06/22/16 1000  azithromycin (ZITHROMAX) 250 mg in dextrose 5 % 125 mL IVPB  Status:  Discontinued     250 mg 125 mL/hr over 60 Minutes Intravenous Every 24 hours 06/22/16 0826 06/27/16 1019   06/22/16 0900  levofloxacin (LEVAQUIN) IVPB 500 mg  Status:  Discontinued     500 mg 100 mL/hr over 60 Minutes Intravenous Every 24 hours 06/22/16 0759 06/22/16 0824      Assessment/Plan: s/p * No surgery found * Reviewed CT scan discussed with the patient and her husband present. No change in the configuration of her thoracic aortic dissection with flow into the celiac and superior mesenteric artery via the true lumen. Continue flow into the right kidney via the false lumen. Continue current support.   LOS: 18 days   Jun Osment 07/08/2016, 11:10 AM

## 2016-07-08 NOTE — Progress Notes (Signed)
  PHARMACY - ADULT TOTAL PARENTERAL NUTRITION CONSULT NOTE   Pharmacy Consult for TPN Indication: prolonged NPO status/high NG output   Patient Measurements: Height: _0  (160 cm) Weight: 174 lb 13.2 oz (79.3 kg) IBW/kg (Calculated) : 52.4 TPN AdjBW (KG): 59.4 Body mass index is 30.97 kg/m. 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 Patient had a significant hx of N/V for 2-4 weeks prior to admission. Weight appears to have been maintained. Patient had a NG tube placed 5/20 which had significant output with 1-1.5 L a day, removed NG tube on 5/30, however, patient with significant episodes of N&V and NG replaced 6/1 with continuous low suction.  Still with significant output - plan is to continue NG another 24 hours.   GI:  NG tube removed & replaced again 6/5.   Ileus vs gastroparesis.  Abd distended, hypoactive BS.  Mult episodes vomiting with CL trial 6/4.  Repeat CT 6/5- (-)perf/pneumatosis, unresolved SB dilatation.  Liquid stools.  Ice chips. Phenergan Q 6 prn, ondansetron prn, bisacodyl, simethicone, PPI-IV Endo: Hx of DM. No insulin at home. CBGs 130-172, improved.  No hypoglycemic events.  Insulin requirements in the past 24 hours: 26 units SSI (12 since new tpn); 80 units of Lantus, 20 units in tpn (inc 6/5) Lytes:  K 3.1, Mg 2.   Lasix 77m IV qday. Renal:AKI:  Cr 1.18 improved  (Baseline < 1). UOP 1795, I/O (-)200 Pulm: RA; Dulera; Brovana, Albuterol  Cards: Stanford type B aortic dissection; BP up today had been soft, HR 70s-90s in NSR. Metoprolol IV, clonidine 0.3 mg patch , nitroglycerin 0.42mpatch, lasix IV qday, hydralazine IV.  Hepatobil:AST 45 improved & ALT now wnl.  Alk phos up to 168, Tbili wnl.  Triglycerides 168 stable.  PAlb 17.7- marked improvement Neuro: Hydromorphone prn ID: S/P Meropenem (5/25-6/4) for respiratory failure from PNA. WBC 11.6 improved (peaked at 36.7).  Tm  100.1.  All cx negative, CDiff neg.  Best Practices:PPI, mc TPN Access:PICC line placed 5/20 TPN start date:5/22  Nutritional Goals (per RD recommendation on 5/25): KCal: 2000-2200 kcal/day  Protein: 110-120 gm/day   Goal  TPN rate: Clinimix 5/15 E @ 83 ml/hr + lipid emulsion 20 ml/hr   Current Nutrition:   TPN Clear liquid diet 6/4 >>  Plan:  Continue Clinimix E 5/15 at 83 ml/hr Continue lipid emulsion 20% at 20 ml/hr  This provides 100 g of protein and 1894 kCals per day meeting 91% of protein and 95% of kCal needs Continue MVI in TPN Continue TE in TPN every other day, next 6/6 Increase Insulin to 30 units in TPN and adjust further as needed Continue Lantus 40 units BID Continue resistant SSI K runs x 4 TPN labs qMon/Thurs   KeLewie Chamber PharmD Clinical Pharmacist [facility]

## 2016-07-08 NOTE — Progress Notes (Signed)
CCS/Haley Graham Progress Note    Subjective: Patient sitting up in the cahir, feels better with NGT that was placed yesterday evening based on unresolved small bowel dilatation seen on CT.  Still asking for liquids.  Objective: Vital signs in last 24 hours: Temp:  [97.8 F (36.6 C)-99.1 F (37.3 C)] 99.1 F (37.3 C) (06/06 0400) Pulse Rate:  [84-101] 86 (06/06 0700) Resp:  [16-25] 17 (06/06 0700) BP: (115-141)/(45-97) 139/63 (06/06 0700) SpO2:  [90 %-100 %] 94 % (06/06 0700) Weight:  [79.3 kg (174 lb 13.2 oz)] 79.3 kg (174 lb 13.2 oz) (06/06 0500) Last BM Date: 07/06/16  Intake/Output from previous day: 06/05 0701 - 06/06 0700 In: 2387.3 [I.V.:2387.3] Out: 2698 [Urine:1795; Emesis/NG output:900; Stool:3] Intake/Output this shift: No intake/output data recorded.  General: No acute distress.  Has abdominal pressure from sitting up in chair.  Lungs: clear  Abd: Soft, distended, some hypoactive bowel sounds.  Extremities: Intact  Neuro: Intact  Lab Results:  @LABLAST2 (wbc:2,hgb:2,hct:2,plt:2) BMET ) Recent Labs  07/07/16 0340 07/08/16 0430  NA 137 138  K 3.8 3.1*  CL 102 101  CO2 25 26  GLUCOSE 182* 165*  BUN 48* 39*  CREATININE 1.32* 1.18*  CALCIUM 8.3* 8.3*   PT/INR No results for input(s): LABPROT, INR in the last 72 hours. ABG No results for input(s): PHART, HCO3 in the last 72 hours.  Invalid input(s): PCO2, PO2  Studies/Results: Ct Abdomen Pelvis W Contrast  Result Date: 07/07/2016 CLINICAL DATA:  61 year old female with abdominal pain and distension greater on the right. Subsequent encounter. EXAM: CT ABDOMEN AND PELVIS WITH CONTRAST TECHNIQUE: Multidetector CT imaging of the abdomen and pelvis was performed using the standard protocol following bolus administration of intravenous contrast. CONTRAST:  75 cc Isovue 300. COMPARISON:  07/06/2016 plain film exam. 06/27/2016 CT angiogram chest, abdomen and pelvis. 10/27/2011 FINDINGS: Lower chest: Basilar  atelectasis greatest medial left lung base. Heart top-normal size with aortic/mitral valve calcification. Hepatobiliary: Mild fatty infiltration of the liver without worrisome hepatic lesion. Post cholecystectomy. Pancreas: No mass or inflammation.  No duct dilation. Spleen: No mass or enlargement. Adrenals/Urinary Tract: Prominent infarct left kidney. Left renal cyst. Nodularity adrenal glands greater on the left has changed slightly since 2013 with largest left adrenal nodule currently measuring 2.6 x 2.5 x 1.9 cm versus on 2013 exam 2.4 x 2.3 x 1.9 cm Stomach/Bowel: Fluid and gas-filled prominent size small bowel without point of transition noted. Fluid-filled featureless colon. Minimal amount of fluid adjacent to small bowel loops and minimal hazy infiltration of fat planes surrounding the descending colon. No pneumatosis or free intraperitoneal air. Findings may reflect changes of enterocolitis of indeterminate etiology whether ischemic, inflammatory or infectious. No pneumatosis or free intraperitoneal air. 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 Dissection extends into iliac arteries. Inferior mesenteric artery may fill in a retrograde fashion. Appearance unchanged from recent CT angiogram. Scattered normal size lymph nodes. Reproductive: No worrisome abnormality. Foley catheter with decompressed urinary bladder. Other: No bowel containing hernia. Musculoskeletal: No acute osseous abnormality. IMPRESSION: Fluid and gas-filled prominent size small bowel without worrisome point of transition. Fluid-filled featureless colon. Minimal amount of fluid adjacent to small bowel loops and minimal hazy infiltration of fat planes surrounding the descending colon. No pneumatosis or free intraperitoneal air. Findings may reflect changes of enterocolitis of indeterminate etiology whether ischemic, inflammatory or infectious. No  pneumatosis or free intraperitoneal air. 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 Infarction majority of the left kidney. Adrenal gland nodularity greater on left stable since recent exams and minimally changed since 2013 as detailed above. Electronically Signed   By: Genia Del M.D.   On: 07/07/2016 14:22   Dg Abd Portable 1v  Result Date: 07/07/2016 CLINICAL DATA:  Feeding tube placement. EXAM: PORTABLE ABDOMEN - 1 VIEW COMPARISON:  07/06/2016 FINDINGS: Interval placement of enteric tube which enters the stomach where it is coiled once and has tip and side-port over the gastric fundus just below the left hemidiaphragm. Air-filled loops of large and small bowel with dilated small bowel loop in the left abdomen measuring 5.7 cm in diameter as findings are not significantly changed. Remainder of the exam is unchanged. IMPRESSION: Persistent air-filled dilated small bowel with air throughout the colon. Nasogastric tube coiled once within the stomach with tip and side-port over the gastric fundus just below the left hemidiaphragm. Electronically Signed   By: Marin Olp M.D.   On: 07/07/2016 19:16   Dg Abd Portable 1v  Result Date: 07/06/2016 CLINICAL DATA:  Multiple episodes of vomiting, abdominal pain and distention. EXAM: PORTABLE ABDOMEN - 1 VIEW COMPARISON:  07/04/2016 FINDINGS: There is persistent dilation and wall edema of small bowel loops, with featureless appearance of some of the visualized small bowel loops. No radiographic evidence of organomegaly or free gas on this limited supine radiograph. IMPRESSION: Persistent dilation/wall edema and featureless appearance of small bowel loops in a nonspecific pattern. These findings may be seen with ileus, incomplete small bowel obstruction or enteritis. Ischemic enteritis continues to be of consideration. Electronically Signed   By: Fidela Salisbury M.D.   On:  07/06/2016 16:14    Anti-infectives: Anti-infectives    Start     Dose/Rate Route Frequency Ordered Stop   06/26/16 0830  meropenem (MERREM) 1 g in sodium chloride 0.9 % 100 mL IVPB  Status:  Discontinued     1 g 200 mL/hr over 30 Minutes Intravenous Every 8 hours 06/26/16 0810 07/06/16 0804   06/22/16 1800  clindamycin (CLEOCIN) IVPB 600 mg  Status:  Discontinued     600 mg 100 mL/hr over 30 Minutes Intravenous Every 8 hours 06/22/16 1723 06/26/16 0810   06/22/16 1000  azithromycin (ZITHROMAX) 250 mg in dextrose 5 % 125 mL IVPB  Status:  Discontinued     250 mg 125 mL/hr over 60 Minutes Intravenous Every 24 hours 06/22/16 0826 06/27/16 1019   06/22/16 0900  levofloxacin (LEVAQUIN) IVPB 500 mg  Status:  Discontinued     500 mg 100 mL/hr over 60 Minutes Intravenous Every 24 hours 06/22/16 0759 06/22/16 0824      Assessment/Plan: s/p  NGT with ice chips only.  Long term resolution of this process is not clear.  There is no real improvement expected with surgical exploration or bowel resection.  Will continue to follow her.  No pneumatosis noted on CT scan.  LOS: 18 days   Kathryne Eriksson. Dahlia Bailiff, MD, FACS [phone] [facility] 07/08/2016

## 2016-07-08 NOTE — Progress Notes (Signed)
  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 SCDs are in place  Objective: Vital signs in last 24 hours: Temp:  [97.6 F (36.4 C)-99.1 F (37.3 C)] 98 F (36.7 C) (06/06 1659) Pulse Rate:  [84-101] 92 (06/06 1900) Cardiac Rhythm: Normal sinus rhythm (06/06 1800) Resp:  [17-22] 18 (06/06 1900) BP: (99-141)/(45-97) 119/66 (06/06 1900) SpO2:  [92 %-100 %] 94 % (06/06 1900) Weight:  [174 lb 13.2 oz (79.3 kg)] 174 lb 13.2 oz (79.3 kg) (06/06 0500)  Hemodynamic parameters for last 24 hours:  afebrile, sinus rhythm  Intake/Output from previous day: 06/05 0701 - 06/06 0700 In: 2480.3 [I.V.:2480.3] Out: 2698 [Urine:1795; Emesis/NG output:900; Stool:3] Intake/Output this shift: No intake/output data recorded.  Abdomen chronically mildly distended but nontender Extremities warm with pedal pulses Lungs clear Neuro intact  Lab Results:  Recent Labs  07/07/16 0340 07/08/16 0430  WBC 15.5* 11.6*  HGB 9.9* 9.7*  HCT 32.0* 31.4*  PLT 291 256   BMET:  Recent Labs  07/07/16 0340 07/08/16 0430  NA 137 138  K 3.8 3.1*  CL 102 101  CO2 25 26  GLUCOSE 182* 165*  BUN 48* 39*  CREATININE 1.32* 1.18*  CALCIUM 8.3* 8.3*    PT/INR: No results for input(s): LABPROT, INR in the last 72 hours. 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

## 2016-07-09 LAB — COMPREHENSIVE METABOLIC PANEL
ALT: 50 U/L (ref 14–54)
AST: 37 U/L (ref 15–41)
Albumin: 1.5 g/dL — ABNORMAL LOW (ref 3.5–5.0)
Alkaline Phosphatase: 128 U/L — ABNORMAL HIGH (ref 38–126)
Anion gap: 10 (ref 5–15)
BUN: 38 mg/dL — AB (ref 6–20)
CHLORIDE: 102 mmol/L (ref 101–111)
CO2: 26 mmol/L (ref 22–32)
CREATININE: 1.11 mg/dL — AB (ref 0.44–1.00)
Calcium: 8.3 mg/dL — ABNORMAL LOW (ref 8.9–10.3)
GFR calc Af Amer: >60 mL/min (ref >60)
GFR, EST NON AFRICAN AMERICAN: 53 mL/min — AB (ref >60)
Glucose, Bld: 153 mg/dL — ABNORMAL HIGH (ref 65–99)
Potassium: 3.5 mmol/L (ref 3.5–5.1)
Sodium: 138 mmol/L (ref 135–145)
Total Bilirubin: 1.2 mg/dL (ref 0.3–1.2)
Total Protein: 5.8 g/dL — ABNORMAL LOW (ref 6.5–8.1)

## 2016-07-09 LAB — GLUCOSE, CAPILLARY
Glucose-Capillary: 124 mg/dL — ABNORMAL HIGH (ref 65–99)
Glucose-Capillary: 132 mg/dL — ABNORMAL HIGH (ref 65–99)
Glucose-Capillary: 140 mg/dL — ABNORMAL HIGH (ref 65–99)
Glucose-Capillary: 142 mg/dL — ABNORMAL HIGH (ref 65–99)
Glucose-Capillary: 143 mg/dL — ABNORMAL HIGH (ref 65–99)
Glucose-Capillary: 154 mg/dL — ABNORMAL HIGH (ref 65–99)
Glucose-Capillary: 176 mg/dL — ABNORMAL HIGH (ref 65–99)

## 2016-07-09 LAB — PHOSPHORUS: PHOSPHORUS: 4.1 mg/dL (ref 2.5–4.6)

## 2016-07-09 LAB — MAGNESIUM: Magnesium: 1.8 mg/dL (ref 1.7–2.4)

## 2016-07-09 MED ORDER — MAGNESIUM SULFATE IN D5W 1-5 GM/100ML-% IV SOLN
1.0000 g | Freq: Once | INTRAVENOUS | Status: AC
  Administered 2016-07-09: 1 g via INTRAVENOUS
  Filled 2016-07-09: qty 100

## 2016-07-09 MED ORDER — FAT EMULSION 20 % IV EMUL
240.0000 mL | INTRAVENOUS | Status: AC
  Administered 2016-07-09: 240 mL via INTRAVENOUS
  Filled 2016-07-09: qty 250

## 2016-07-09 MED ORDER — POTASSIUM CHLORIDE 10 MEQ/50ML IV SOLN
10.0000 meq | INTRAVENOUS | Status: AC
  Administered 2016-07-09 (×2): 10 meq via INTRAVENOUS
  Filled 2016-07-09: qty 50

## 2016-07-09 MED ORDER — TRACE MINERALS CR-CU-MN-SE-ZN 10-1000-500-60 MCG/ML IV SOLN
INTRAVENOUS | Status: AC
  Administered 2016-07-09: 17:00:00 via INTRAVENOUS
  Filled 2016-07-09: qty 1992

## 2016-07-09 NOTE — Plan of Care (Signed)
Problem: Activity: Goal: Risk for activity intolerance will decrease Outcome: Progressing Pt encouraged to continue ambulation regimen. Will assess for increased tolerance.  Problem: Fluid Volume: Goal: Ability to maintain a balanced intake and output will improve Outcome: Not Progressing Pt remains npo with ice chips. Will monitor for tolerance.  Problem: Nutrition: Goal: Adequate nutrition will be maintained Outcome: Progressing Pt on TPN and lipids.

## 2016-07-09 NOTE — Progress Notes (Signed)
      [address]       ,[place] [address]             [phone]      Tolerating NG clamping  BP 136/65   Pulse 95   Temp 98.3 F (36.8 C) (Oral)   Resp 15   Ht 5\' 3"  (1.6 m)   Wt 175 lb 14.8 oz (79.8 kg)   SpO2 100%   BMI 31.16 kg/m    Intake/Output Summary (Last 24 hours) at 07/09/16 1733 Last data filed at 07/09/16 1600  Gross per 24 hour  Intake             2802 ml  Output             2600 ml  Net              202 ml    Continue present care  Remo Lipps C. [name], MD [facility] [phone]

## 2016-07-09 NOTE — Progress Notes (Signed)
CCS/Bernice Mcauliffe Progress Note    Subjective: Awake and alert.  NGT is clamped  Objective: Vital signs in last 24 hours: Temp:  [97.6 F (36.4 C)-100.1 F (37.8 C)] 97.6 F (36.4 C) (06/07 0700) Pulse Rate:  [84-105] 100 (06/07 0600) Resp:  [17-25] 21 (06/07 0600) BP: (99-135)/(55-71) 120/61 (06/07 0600) SpO2:  [94 %-100 %] 100 % (06/07 0917) Weight:  [79.8 kg (175 lb 14.8 oz)] 79.8 kg (175 lb 14.8 oz) (06/07 0600) Last BM Date: 07/06/16  Intake/Output from previous day: 06/06 0701 - 06/07 0700 In: 2865 [P.O.:120; I.V.:2455; NG/GT:90; IV Piggyback:200] Out: 2400 [Urine:1550; Emesis/NG output:550; UVOZD:664] Intake/Output this shift: No intake/output data recorded.  General: No distress.    Lungs: Clear  Abd: NGT clamped  Extremities: No changes  Neuro: Intact  Lab Results:  @LABLAST2 (wbc:2,hgb:2,hct:2,plt:2) BMET ) Recent Labs  07/08/16 0430 07/09/16 0553  NA 138 138  K 3.1* 3.5  CL 101 102  CO2 26 26  GLUCOSE 165* 153*  BUN 39* 38*  CREATININE 1.18* 1.11*  CALCIUM 8.3* 8.3*   PT/INR No results for input(s): LABPROT, INR in the last 72 hours. ABG No results for input(s): PHART, HCO3 in the last 72 hours.  Invalid input(s): PCO2, PO2  Studies/Results: Ct Abdomen Pelvis W Contrast  Result Date: 07/07/2016 CLINICAL DATA:  61 year old female with abdominal pain and distension greater on the right. Subsequent encounter. EXAM: CT ABDOMEN AND PELVIS WITH CONTRAST TECHNIQUE: Multidetector CT imaging of the abdomen and pelvis was performed using the standard protocol following bolus administration of intravenous contrast. CONTRAST:  75 cc Isovue 300. COMPARISON:  07/06/2016 plain film exam. 06/27/2016 CT angiogram chest, abdomen and pelvis. 10/27/2011 FINDINGS: Lower chest: Basilar atelectasis greatest medial left lung base. Heart top-normal size with aortic/mitral valve calcification. Hepatobiliary: Mild fatty infiltration of the liver without worrisome hepatic lesion.  Post cholecystectomy. Pancreas: No mass or inflammation.  No duct dilation. Spleen: No mass or enlargement. Adrenals/Urinary Tract: Prominent infarct left kidney. Left renal cyst. Nodularity adrenal glands greater on the left has changed slightly since 2013 with largest left adrenal nodule currently measuring 2.6 x 2.5 x 1.9 cm versus on 2013 exam 2.4 x 2.3 x 1.9 cm Stomach/Bowel: Fluid and gas-filled prominent size small bowel without point of transition noted. Fluid-filled featureless colon. Minimal amount of fluid adjacent to small bowel loops and minimal hazy infiltration of fat planes surrounding the descending colon. No pneumatosis or free intraperitoneal air. Findings may reflect changes of enterocolitis of indeterminate etiology whether ischemic, inflammatory or infectious. No pneumatosis or free intraperitoneal air. 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 Dissection extends into iliac arteries. Inferior mesenteric artery may fill in a retrograde fashion. Appearance unchanged from recent CT angiogram. Scattered normal size lymph nodes. Reproductive: No worrisome abnormality. Foley catheter with decompressed urinary bladder. Other: No bowel containing hernia. Musculoskeletal: No acute osseous abnormality. IMPRESSION: Fluid and gas-filled prominent size small bowel without worrisome point of transition. Fluid-filled featureless colon. Minimal amount of fluid adjacent to small bowel loops and minimal hazy infiltration of fat planes surrounding the descending colon. No pneumatosis or free intraperitoneal air. Findings may reflect changes of enterocolitis of indeterminate etiology whether ischemic, inflammatory or infectious. No pneumatosis or free intraperitoneal air. 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 Infarction majority  of the left kidney. Adrenal gland nodularity greater on left stable since recent exams  and minimally changed since 2013 as detailed above. Electronically Signed   By: Genia Del M.D.   On: 07/07/2016 14:22   Dg Chest Port 1 View  Result Date: 07/08/2016 CLINICAL DATA:  Shortness of Breath EXAM: PORTABLE CHEST 1 VIEW COMPARISON:  July 04, 2016 FINDINGS: Central catheter tip is in the superior vena cava, stable. Nasogastric tube tip and side port are in the stomach. No pneumothorax. There is persistent airspace consolidation in the left lower lobe with small left pleural effusion. Right lung is clear. Heart is upper normal in size with pulmonary vascularity within normal limits. No adenopathy. No evident bone lesions. IMPRESSION: Tube and catheter positions as described without pneumothorax. Persistent left lower lobe consolidation with small left pleural effusion. No new parenchymal lung opacity. Stable cardiac silhouette. Electronically Signed   By: Lowella Grip III M.D.   On: 07/08/2016 08:00   Dg Abd Portable 1v  Result Date: 07/08/2016 CLINICAL DATA:  Abdominal distention EXAM: PORTABLE ABDOMEN - 1 VIEW COMPARISON:  July 07, 2016 FINDINGS: There may be slightly less bowel dilatation compared to 1 day prior. No air-fluid levels evident. No free air. Nasogastric tube tip and side port remain in stomach. IMPRESSION: Probable ileus with marginally less bowel dilatation compared to 1 day prior. No free air. Nasogastric tube tip and side port in stomach. Electronically Signed   By: Lowella Grip III M.D.   On: 07/08/2016 08:00   Dg Abd Portable 1v  Result Date: 07/07/2016 CLINICAL DATA:  Feeding tube placement. EXAM: PORTABLE ABDOMEN - 1 VIEW COMPARISON:  07/06/2016 FINDINGS: Interval placement of enteric tube which enters the stomach where it is coiled once and has tip and side-port over the gastric fundus just below the left hemidiaphragm. Air-filled loops of large and small bowel with dilated  small bowel loop in the left abdomen measuring 5.7 cm in diameter as findings are not significantly changed. Remainder of the exam is unchanged. IMPRESSION: Persistent air-filled dilated small bowel with air throughout the colon. Nasogastric tube coiled once within the stomach with tip and side-port over the gastric fundus just below the left hemidiaphragm. Electronically Signed   By: Marin Olp M.D.   On: 07/07/2016 19:16    Anti-infectives: Anti-infectives    Start     Dose/Rate Route Frequency Ordered Stop   06/26/16 0830  meropenem (MERREM) 1 g in sodium chloride 0.9 % 100 mL IVPB  Status:  Discontinued     1 g 200 mL/hr over 30 Minutes Intravenous Every 8 hours 06/26/16 0810 07/06/16 0804   06/22/16 1800  clindamycin (CLEOCIN) IVPB 600 mg  Status:  Discontinued     600 mg 100 mL/hr over 30 Minutes Intravenous Every 8 hours 06/22/16 1723 06/26/16 0810   06/22/16 1000  azithromycin (ZITHROMAX) 250 mg in dextrose 5 % 125 mL IVPB  Status:  Discontinued     250 mg 125 mL/hr over 60 Minutes Intravenous Every 24 hours 06/22/16 0826 06/27/16 1019   06/22/16 0900  levofloxacin (LEVAQUIN) IVPB 500 mg  Status:  Discontinued     500 mg 100 mL/hr over 60 Minutes Intravenous Every 24 hours 06/22/16 0759 06/22/16 0824      Assessment/Plan: s/p  Agree with trying to progress with clamping NGT.  Perhaps start clears if she tolerated clamping, but would leave NGT in place for ltoday.  LOS: 19 days   Kathryne Eriksson. Dahlia Bailiff, MD, FACS [phone] [facility] 07/09/2016

## 2016-07-09 NOTE — Progress Notes (Signed)
  PHARMACY - ADULT TOTAL PARENTERAL NUTRITION CONSULT NOTE   Pharmacy Consult for TPN Indication: prolonged NPO status/high NG output   Patient Measurements: Height: _0  (160 cm) Weight: 175 lb 14.8 oz (79.8 kg) IBW/kg (Calculated) : 52.4 TPN AdjBW (KG): 59.4 Body mass index is 31.16 kg/m. 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 Patient had a significant hx of N/V for 2-4 weeks prior to admission. Weight appears to have been maintained. Patient had a NG tube placed 5/20 which had significant output with 1-1.5 L a day, removed NG tube on 5/30, however, patient with significant episodes of N&V and NG replaced 6/1 with continuous low suction. NG tube removed but multiple episodes of vomiting with CL trial 6/4 & NG tube replaced again 6/5.    GI: Ileus vs gastroparesis.  Abd distended, hypoactive BS.  Repeat CT 6/5- (-)perf/pneumatosis, unresolved SB dilatation. NGT output down, 550 mL last 24 hrs (bilious).  Liquid stools.  Ice chips. Patient is hungry and wants to drink.  Phenergan Q 6 prn, ondansetron prn, bisacodyl, simethicone, PPI-IV Endo: Hx of DM. No insulin at home. CBGs 121-153, improved.  No hypoglycemic events.  Insulin requirements in the past 24 hours: 8 units SSI (4 since new tpn); 80 units of Lantus (split 40 BID), 30 units in tpn (inc 6/6) Lytes:  K up 3.5, Mg down 1.8. Lasix 27m IV qday. Phos 4.1.  Renal:AKI:  Cr 1.11 improved  (Baseline < 1). UOP 0.8 cc/kg/hr, I/O (+)465,  Net - 8.2L.  Pulm: 2L [place]; Dulera; Brovana, Albuterol  Cards: Stanford type B aortic dissection; BP up today had been soft, HR 90s in NSR. Metoprolol IV, clonidine 0.3 mg patch , nitroglycerin 0.420mpatch, lasix IV qday, hydralazine IV.  Hepatobil:AST & ALT now improved to wnl.  Alk phos down to 128, Tbili wnl.  Triglycerides 168 stable.  PAlb 9 >> 17.7- marked improvement Neuro: Fentanyl prn ID: S/P Meropenem  (5/25-6/4) for respiratory failure from PNA. WBC 11.6 improved (peaked at 36.7).  Tm 100.1.  All cx negative, CDiff neg.  Best Practices:PPI, mc TPN Access:PICC line placed 5/20 TPN start date:5/22  Nutritional Goals (per RD recommendation on 5/25): KCal: 2000-2200 kcal/day  Protein: 110-120 gm/day   Goal  TPN rate: Clinimix 5/15 E @ 83 ml/hr + lipid emulsion 20 ml/hr   Current Nutrition:   TPN  Plan:  Continue Clinimix E 5/15 at 83 ml/hr Continue lipid emulsion 20% at 20 ml/hr  This provides 100 g of protein and 1894 kCals per day meeting 91% of protein and 95% of kCal needs Continue MVI in TPN Continue TE in TPN every other day, next 6/6 Continue Insulin 30 units in TPN and adjust further as needed Continue Lantus 40 units BID Continue TCTS SSI q4h K runs x 2 Magnesium sulfate 1 gm IV x1 TPN labs qMon/Thurs   JeSloan LeiterPharmD, BCPS Clinical Pharmacist Clinical Phone 07/09/2016 until 3:30 PM - #[phone] hours, please call #[phone]

## 2016-07-09 NOTE — Progress Notes (Signed)
Physical Therapy Treatment Patient Details Name: Haley Graham MRN: [id number] DOB: Feb 27, 1955 Today's Date: 07/09/2016    History of Present Illness Pt adm with type B aortic dissection. Treated non surgically with BP control and pain control. Pt developed pancreatitis and ileus with NG tube placed. PMH - anxiety, arthritis    PT Comments    Patient continues to progress with mobility and able to increase gait distance this session. Continue to progress as tolerated with anticipated d/c home with HHPT.    Follow Up Recommendations  Home health PT;Supervision/Assistance - 24 hour     Equipment Recommendations  Rolling walker with 5" wheels    Recommendations for Other Services       Precautions / Restrictions Precautions Precautions: Fall Restrictions Weight Bearing Restrictions: No    Mobility  Bed Mobility Overal bed mobility: Needs Assistance Bed Mobility: Sidelying to Sit;Sit to Sidelying;Rolling Rolling: Supervision Sidelying to sit: Min assist     Sit to sidelying: Min assist General bed mobility comments: cues for sequencing and technique; assist for elevation of trunk into sitting and then to bring bilat LE into bed  Transfers Overall transfer level: Needs assistance Equipment used: Rolling walker (2 wheeled);1 person hand held assist Transfers: Sit to/from Stand Sit to Stand: Min guard         General transfer comment: cues for hand placement each trial throughout session with some carry over end of session; min guard for safety  Ambulation/Gait Ambulation/Gait assistance: Min guard Ambulation Distance (Feet):  (53ft, 21ft, 65ft, then 141ft) Assistive device: Rolling walker (2 wheeled) Gait Pattern/deviations: Step-through pattern;Decreased stride length Gait velocity: decr   General Gait Details: 3 seated rest breaks required; cues for breathing; very guarded movement and pt anxious still with mobilty; HR up to 130 and RR elevated; SpO2 WNL with  O2   Stairs            Wheelchair Mobility    Modified Rankin (Stroke Patients Only)       Balance Overall balance assessment: Needs assistance   Sitting balance-Leahy Scale: Fair       Standing balance-Leahy Scale: Fair Standing balance comment: pt is able to static stand without UE support                            Cognition Arousal/Alertness: Awake/alert Behavior During Therapy: Flat affect;Anxious Overall Cognitive Status: Within Functional Limits for tasks assessed                                        Exercises      General Comments        Pertinent Vitals/Pain Pain Assessment: Faces Faces Pain Scale: Hurts little more Pain Location: chest Pain Descriptors / Indicators: Discomfort;Tightness Pain Intervention(s): Monitored during session;Premedicated before session    Home Living                      Prior Function            PT Goals (current goals can now be found in the care plan section) Progress towards PT goals: Progressing toward goals    Frequency    Min 3X/week      PT Plan Current plan remains appropriate    Co-evaluation              AM-PAC PT "6  Clicks" Daily Activity  Outcome Measure  Difficulty turning over in bed (including adjusting bedclothes, sheets and blankets)?: A Little Difficulty moving from lying on back to sitting on the side of the bed? : Total Difficulty sitting down on and standing up from a chair with arms (e.g., wheelchair, bedside commode, etc,.)?: A Little Help needed moving to and from a bed to chair (including a wheelchair)?: A Little Help needed walking in hospital room?: A Little Help needed climbing 3-5 steps with a railing? : A Lot 6 Click Score: 15    End of Session Equipment Utilized During Treatment: Gait belt Activity Tolerance: Patient tolerated treatment well Patient left: with call bell/phone within reach;with family/visitor present;with  nursing/sitter in room;in bed Nurse Communication: Mobility status PT Visit Diagnosis: Muscle weakness (generalized) (M62.81);Other abnormalities of gait and mobility (R26.89)     Time: 1130-1209 PT Time Calculation (min) (ACUTE ONLY): 39 min  Charges:  $Gait Training: 23-37 mins $Therapeutic Activity: 8-22 mins                    G Codes:       Earney Navy, PTA Pager: [phone]     Darliss Cheney 07/09/2016, 4:07 PM

## 2016-07-09 NOTE — Progress Notes (Signed)
  Subjective: NG tube output decreased overnite- will clamp and see how she tolerates Low grade temp last pm 100.1- she has been off antibiotics 3-4 days Walked in hall 3 times yesterday and will   repeat today    Objective: Vital signs in last 24 hours: Temp:  [97.6 F (36.4 C)-100.1 F (37.8 C)] 97.6 F (36.4 C) (06/07 0700) Pulse Rate:  [84-105] 100 (06/07 0600) Cardiac Rhythm: Normal sinus rhythm (06/07 0800) Resp:  [17-25] 21 (06/07 0600) BP: (99-135)/(53-71) 120/61 (06/07 0600) SpO2:  [94 %-100 %] 95 % (06/07 0600) Weight:  [175 lb 14.8 oz (79.8 kg)] 175 lb 14.8 oz (79.8 kg) (06/07 0600)  Hemodynamic parameters for last 24 hours:  nsr BP well controlled  Intake/Output from previous day: 06/06 0701 - 06/07 0700 In: 2865 [P.O.:120; I.V.:2455; NG/GT:90; IV Piggyback:200] Out: 2400 [Urine:1550; Emesis/NG output:550; UUVOZ:366] Intake/Output this shift: No intake/output data recorded.  Alert Lungs clear; wet cough abd non tender extrem warm, 3+ pedal pulses Lab Results:  Recent Labs  07/07/16 0340 07/08/16 0430  WBC 15.5* 11.6*  HGB 9.9* 9.7*  HCT 32.0* 31.4*  PLT 291 256   BMET:  Recent Labs  07/08/16 0430 07/09/16 0553  NA 138 138  K 3.1* 3.5  CL 101 102  CO2 26 26  GLUCOSE 165* 153*  BUN 39* 38*  CREATININE 1.18* 1.11*  CALCIUM 8.3* 8.3*    PT/INR: No results for input(s): LABPROT, INR in the last 72 hours. ABG    Component Value Date/Time   PHART 7.430 06/28/2016 1615   HCO3 20.7 06/28/2016 1615   TCO2 22 06/28/2016 1615   ACIDBASEDEF 3.0 (H) 06/28/2016 1615   O2SAT 93.0 06/28/2016 1615   CBG (last 3)   Recent Labs  07/08/16 2354 07/09/16 0404 07/09/16 0749  GLUCAP 124* 140* 154*    Assessment/Plan: S/P Type B aortic dissection BP control Support with TNA until GI function returns- this appears to be the main problem NG tube clamp trial Ambulate Minimize pain meds Repeat CBC, KUB in am   LOS: 19 days    Tharon Aquas Trigt  III 07/09/2016

## 2016-07-10 ENCOUNTER — Inpatient Hospital Stay (HOSPITAL_COMMUNITY): Payer: BLUE CROSS/BLUE SHIELD

## 2016-07-10 DIAGNOSIS — I7102 Dissection of abdominal aorta: Secondary | ICD-10-CM

## 2016-07-10 LAB — GLUCOSE, CAPILLARY
Glucose-Capillary: 164 mg/dL — ABNORMAL HIGH (ref 65–99)
Glucose-Capillary: 132 mg/dL — ABNORMAL HIGH (ref 65–99)
Glucose-Capillary: 164 mg/dL — ABNORMAL HIGH (ref 65–99)
Glucose-Capillary: 168 mg/dL — ABNORMAL HIGH (ref 65–99)
Glucose-Capillary: 189 mg/dL — ABNORMAL HIGH (ref 65–99)

## 2016-07-10 LAB — BASIC METABOLIC PANEL
Anion gap: 8 (ref 5–15)
BUN: 36 mg/dL — ABNORMAL HIGH (ref 6–20)
CO2: 24 mmol/L (ref 22–32)
Calcium: 8.1 mg/dL — ABNORMAL LOW (ref 8.9–10.3)
Chloride: 103 mmol/L (ref 101–111)
Creatinine, Ser: 1.03 mg/dL — ABNORMAL HIGH (ref 0.44–1.00)
GFR calc Af Amer: >60 mL/min (ref >60)
GFR calc non Af Amer: 58 mL/min — ABNORMAL LOW (ref >60)
Glucose, Bld: 151 mg/dL — ABNORMAL HIGH (ref 65–99)
Potassium: 3.2 mmol/L — ABNORMAL LOW (ref 3.5–5.1)
Sodium: 135 mmol/L (ref 135–145)

## 2016-07-10 LAB — CBC
HCT: 30.9 % — ABNORMAL LOW (ref 36.0–46.0)
Hemoglobin: 9.6 g/dL — ABNORMAL LOW (ref 12.0–15.0)
MCH: 31.7 pg (ref 26.0–34.0)
MCHC: 31.1 g/dL (ref 30.0–36.0)
MCV: 102 fL — ABNORMAL HIGH (ref 78.0–100.0)
Platelets: 181 10*3/uL (ref 150–400)
RBC: 3.03 MIL/uL — ABNORMAL LOW (ref 3.87–5.11)
RDW: 13.5 % (ref 11.5–15.5)
WBC: 12 10*3/uL — ABNORMAL HIGH (ref 4.0–10.5)

## 2016-07-10 MED ORDER — HYDROMORPHONE HCL 2 MG PO TABS
1.0000 mg | ORAL_TABLET | Freq: Three times a day (TID) | ORAL | Status: DC | PRN
  Administered 2016-07-29 – 2016-08-05 (×10): 1 mg via ORAL
  Filled 2016-07-10 (×12): qty 1

## 2016-07-10 MED ORDER — FAT EMULSION 20 % IV EMUL
240.0000 mL | INTRAVENOUS | Status: AC
  Administered 2016-07-10: 240 mL via INTRAVENOUS
  Filled 2016-07-10: qty 250

## 2016-07-10 MED ORDER — M.V.I. ADULT IV INJ
INTRAVENOUS | Status: AC
  Administered 2016-07-10: 18:00:00 via INTRAVENOUS
  Filled 2016-07-10: qty 1992

## 2016-07-10 MED ORDER — POTASSIUM CHLORIDE 10 MEQ/50ML IV SOLN
10.0000 meq | INTRAVENOUS | Status: AC
  Administered 2016-07-10 (×4): 10 meq via INTRAVENOUS
  Filled 2016-07-10 (×4): qty 50

## 2016-07-10 NOTE — Progress Notes (Signed)
Subjective: Interval History: none.. In bed currently but has been up walking. Tolerating clears currently.  Objective: Vital signs in last 24 hours: Temp:  [98.3 F (36.8 C)-99.5 F (37.5 C)] 98.8 F (37.1 C) (06/08 0800) Pulse Rate:  [88-101] 92 (06/08 1000) Resp:  [15-24] 15 (06/08 1000) BP: (112-158)/(54-91) 137/64 (06/08 1000) SpO2:  [89 %-100 %] 98 % (06/08 1000) Weight:  [175 lb 11.3 oz (79.7 kg)] 175 lb 11.3 oz (79.7 kg) (06/08 0500)  Intake/Output from previous day: 06/07 0701 - 06/08 0700 In: 2701.2 [I.V.:2341.2; NG/GT:360] Out: 2425 [Urine:1875; Emesis/NG output:175; Stool:375] Intake/Output this shift: Total I/O In: -  Out: 125 [Urine:125]  Abdomen soft nontender.  Lab Results:  Recent Labs  07/08/16 0430 07/10/16 0530  WBC 11.6* 12.0*  HGB 9.7* 9.6*  HCT 31.4* 30.9*  PLT 256 181   BMET  Recent Labs  07/09/16 0553 07/10/16 0530  NA 138 135  K 3.5 3.2*  CL 102 103  CO2 26 24  GLUCOSE 153* 151*  BUN 38* 36*  CREATININE 1.11* 1.03*  CALCIUM 8.3* 8.1*    Studies/Results: Ct Abdomen Pelvis Wo Contrast  Result Date: 06/26/2016 CLINICAL DATA:  Shortness of breath EXAM: CT CHEST, ABDOMEN AND PELVIS WITHOUT CONTRAST TECHNIQUE: Multidetector CT imaging of the chest, abdomen and pelvis was performed following the standard protocol without IV contrast. COMPARISON:  06/20/2016 chest CT FINDINGS: CT CHEST FINDINGS Cardiovascular: Normal heart size. No pericardial effusion. 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 Mild haziness around the upper descending segment is likely stable. Stable maximal diameter of 33 mm at this level. No intramural hematoma. Intermittently seen displaced intimal calcification. Mediastinum/Nodes: Negative for adenopathy. Right upper extremity PICC with tip at the SVC level. Lungs/Pleura: Multi segment atelectasis, worse on the left. Small pleural effusions. There is patchy ground-glass airspace density in the  bilateral lungs without gradient. Airways are clear and there is no septal thickening. Musculoskeletal: No acute or aggressive finding. CT ABDOMEN PELVIS FINDINGS Hepatobiliary: Hepatic steatosis.Cholecystectomy with normal common bile duct diameter. Pancreas: Unremarkable. Spleen: Unremarkable. 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 The larger measures up to 27 mm and is indeterminate by densitometry, but left adrenal mass has been noted since at least 2006 abdominal CT report, images not available. Left renal cyst. No hydronephrosis or urolithiasis. 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 Negative decompressed urinary bladder. Stomach/Bowel: There is diffuse small bowel distention and fluid filling with mildly hazy mesenteries. Similar features in the colon proximally and at the transverse segment. No pneumatosis or perforation is noted. Nasogastric tube tip is at the pylorus. Vascular/Lymphatic: There is intimal flap displacement from known dissection, morphology appearing similar to prior. No mass or adenopathy. Reproductive:Hysterectomy.  Unremarkable ovaries. Other: No ascites or pneumoperitoneum.  Anasarca. Musculoskeletal: No acute abnormalities. These results were called by telephone at the time of interpretation on 06/26/2016 at 2:05 pm to Dr. Ivin Poot , who verbally acknowledged these results. IMPRESSION: 1. Diffuse airspace disease. Pattern can be seen with noncardiogenic edema (ARDS), inflammatory pneumonitis, or atypical infection. Multi segment atelectasis at the bases and small pleural effusions. 2. Diffuse small bowel and colonic distention with fluid levels as seen with ileus. Question underlying bowel ischemia given the patient's known aortic dissection with proximal IMA occlusion and SMA flow via the narrow true lumen. 3. Incidental findings noted above. Electronically Signed   By:  Monte Fantasia  M.D.   On: 06/26/2016 14:05   Ct Chest Wo Contrast  Result Date: 06/26/2016 CLINICAL DATA:  Shortness of breath EXAM: CT CHEST, ABDOMEN AND PELVIS WITHOUT CONTRAST TECHNIQUE: Multidetector CT imaging of the chest, abdomen and pelvis was performed following the standard protocol without IV contrast. COMPARISON:  06/20/2016 chest CT FINDINGS: CT CHEST FINDINGS Cardiovascular: Normal heart size. No pericardial effusion. 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 Mild haziness around the upper descending segment is likely stable. Stable maximal diameter of 33 mm at this level. No intramural hematoma. Intermittently seen displaced intimal calcification. Mediastinum/Nodes: Negative for adenopathy. Right upper extremity PICC with tip at the SVC level. Lungs/Pleura: Multi segment atelectasis, worse on the left. Small pleural effusions. There is patchy ground-glass airspace density in the bilateral lungs without gradient. Airways are clear and there is no septal thickening. Musculoskeletal: No acute or aggressive finding. CT ABDOMEN PELVIS FINDINGS Hepatobiliary: Hepatic steatosis.Cholecystectomy with normal common bile duct diameter. Pancreas: Unremarkable. Spleen: Unremarkable. 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 The larger measures up to 27 mm and is indeterminate by densitometry, but left adrenal mass has been noted since at least 2006 abdominal CT report, images not available. Left renal cyst. No hydronephrosis or urolithiasis. 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 Negative decompressed urinary bladder. Stomach/Bowel: There is diffuse small bowel distention and fluid filling with mildly hazy mesenteries. Similar features in the colon proximally and at the transverse segment. No pneumatosis or perforation is noted. Nasogastric tube tip is at the pylorus. Vascular/Lymphatic:  There is intimal flap displacement from known dissection, morphology appearing similar to prior. No mass or adenopathy. Reproductive:Hysterectomy.  Unremarkable ovaries. Other: No ascites or pneumoperitoneum.  Anasarca. Musculoskeletal: No acute abnormalities. These results were called by telephone at the time of interpretation on 06/26/2016 at 2:05 pm to Dr. Ivin Poot , who verbally acknowledged these results. IMPRESSION: 1. Diffuse airspace disease. Pattern can be seen with noncardiogenic edema (ARDS), inflammatory pneumonitis, or atypical infection. Multi segment atelectasis at the bases and small pleural effusions. 2. Diffuse small bowel and colonic distention with fluid levels as seen with ileus. Question underlying bowel ischemia given the patient's known aortic dissection with proximal IMA occlusion and SMA flow via the narrow true lumen. 3. Incidental findings noted above. Electronically Signed   By: Monte Fantasia M.D.   On: 06/26/2016 14:05   Ct Abdomen Pelvis W Contrast  Result Date: 07/07/2016 CLINICAL DATA:  61 year old female with abdominal pain and distension greater on the right. Subsequent encounter. EXAM: CT ABDOMEN AND PELVIS WITH CONTRAST TECHNIQUE: Multidetector CT imaging of the abdomen and pelvis was performed using the standard protocol following bolus administration of intravenous contrast. CONTRAST:  75 cc Isovue 300. COMPARISON:  07/06/2016 plain film exam. 06/27/2016 CT angiogram chest, abdomen and pelvis. 10/27/2011 FINDINGS: Lower chest: Basilar atelectasis greatest medial left lung base. Heart top-normal size with aortic/mitral valve calcification. Hepatobiliary: Mild fatty infiltration of the liver without worrisome hepatic lesion. Post cholecystectomy. Pancreas: No mass or inflammation.  No duct dilation. Spleen: No mass or enlargement. Adrenals/Urinary Tract: Prominent infarct left kidney. Left renal cyst. Nodularity adrenal glands greater on the left has changed slightly  since 2013 with largest left adrenal nodule currently measuring 2.6 x 2.5 x 1.9 cm versus on 2013 exam 2.4 x 2.3 x 1.9 cm Stomach/Bowel: Fluid and gas-filled prominent size small bowel without point of transition noted. Fluid-filled  featureless colon. Minimal amount of fluid adjacent to small bowel loops and minimal hazy infiltration of fat planes surrounding the descending colon. No pneumatosis or free intraperitoneal air. Findings may reflect changes of enterocolitis of indeterminate etiology whether ischemic, inflammatory or infectious. No pneumatosis or free intraperitoneal air. 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 Dissection extends into iliac arteries. Inferior mesenteric artery may fill in a retrograde fashion. Appearance unchanged from recent CT angiogram. Scattered normal size lymph nodes. Reproductive: No worrisome abnormality. Foley catheter with decompressed urinary bladder. Other: No bowel containing hernia. Musculoskeletal: No acute osseous abnormality. IMPRESSION: Fluid and gas-filled prominent size small bowel without worrisome point of transition. Fluid-filled featureless colon. Minimal amount of fluid adjacent to small bowel loops and minimal hazy infiltration of fat planes surrounding the descending colon. No pneumatosis or free intraperitoneal air. Findings may reflect changes of enterocolitis of indeterminate etiology whether ischemic, inflammatory or infectious. No pneumatosis or free intraperitoneal air. 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 Infarction majority of the left kidney. Adrenal gland nodularity greater on left stable since recent exams and minimally changed since 2013 as detailed above. Electronically Signed   By: Genia Del M.D.   On: 07/07/2016 14:22   Dg Chest Port 1 View  Result  Date: 07/08/2016 CLINICAL DATA:  Shortness of Breath EXAM: PORTABLE CHEST 1 VIEW COMPARISON:  July 04, 2016 FINDINGS: Central catheter tip is in the superior vena cava, stable. Nasogastric tube tip and side port are in the stomach. No pneumothorax. There is persistent airspace consolidation in the left lower lobe with small left pleural effusion. Right lung is clear. Heart is upper normal in size with pulmonary vascularity within normal limits. No adenopathy. No evident bone lesions. IMPRESSION: Tube and catheter positions as described without pneumothorax. Persistent left lower lobe consolidation with small left pleural effusion. No new parenchymal lung opacity. Stable cardiac silhouette. Electronically Signed   By: Lowella Grip III M.D.   On: 07/08/2016 08:00   Dg Chest Port 1 View  Result Date: 07/04/2016 CLINICAL DATA:  Followup shortness of breath.  Aortic dissection. EXAM: PORTABLE CHEST 1 VIEW COMPARISON:  07/01/2016 FINDINGS: Nasogastric tube enters the stomach. Right arm PICC tip is in the SVC above right atrium. Less pulmonary edema. Persistent left effusion. Persistent volume loss in both lower lobes left worse than right. Chronic prominence of the aortic shadow consistent with the history of dissection. IMPRESSION: Less pulmonary edema. Persistent left effusion. Persistent atelectasis in both lower lobes left more than right. Persistent prominent aortic/mediastinal shadow. Electronically Signed   By: Nelson Chimes M.D.   On: 07/04/2016 08:11   Dg Chest Port 1 View  Result Date: 07/01/2016 CLINICAL DATA:  Shortness of breath common pneumonia, thoracic aortic dissection EXAM: PORTABLE CHEST 1 VIEW COMPARISON:  Portable chest x-ray of Jun 30, 2016 FINDINGS: The lungs are adequately inflated. The pulmonary interstitial markings are less prominent. The retrocardiac region remains dense and there is remains partial obscuration of the left hemidiaphragm. The heart is top-normal in size. The mediastinum  is normal in width. The esophagogastric tube tip projects below the inferior margin of the image. The right PICC line tip projects over the midportion of the SVC. IMPRESSION: Slight interval improvement in the appearance of the pulmonary interstitium suggests decreasing interstitial edema. There is persistent left lower lobe atelectasis or pneumonia. Electronically Signed  By: David  Martinique M.D.   On: 07/01/2016 07:35   Dg Chest Port 1 View  Result Date: 06/30/2016 CLINICAL DATA:  Breath, pneumonia, aortic aneurysm, GERD, hypertension, diabetes mellitus EXAM: PORTABLE CHEST 1 VIEW COMPARISON:  Portable exam 0633 hours compared to 06/29/2016 FINDINGS: Nasogastric tube coiled in proximal stomach. RIGHT arm PICC line tip projects over SVC above cavoatrial junction. Upper normal heart size. Mediastinal contours normal. Mild pulmonary vascular congestion. Persistent diffuse BILATERAL infiltrates. Atelectasis versus consolidation LEFT lower lobe. No pneumothorax. IMPRESSION: Persistent mild diffuse interstitial infiltrates with atelectasis versus consolidation in LEFT lower lobe. Electronically Signed   By: Lavonia Dana M.D.   On: 06/30/2016 06:58   Dg Chest Port 1 View  Result Date: 06/29/2016 CLINICAL DATA:  Shortness of Breath EXAM: PORTABLE CHEST 1 VIEW COMPARISON:  06/28/2016 FINDINGS: Nasogastric catheter is noted coiled within the stomach. Right-sided PICC line is seen in the distal superior vena cava. Cardiac shadow is mildly enlarged but stable. Bilateral vascular congestion and edema is seen stable from prior exam. Some atelectasis remains in the left retrocardiac region. IMPRESSION: No significant interval change from the prior exam. Electronically Signed   By: Inez Catalina M.D.   On: 06/29/2016 07:36   Dg Chest Port 1 View  Result Date: 06/28/2016 CLINICAL DATA:  Aortic dissection. EXAM: PORTABLE CHEST 1 VIEW COMPARISON:  06/27/2016. FINDINGS: The heart is enlarged. There is moderate vascular  congestion. Prominence of the aortic contour reflecting the type B dissection. LEFT lower lobe atelectasis with effusion. IMPRESSION: Cardiomegaly. Aortic dissection. Vascular congestion. LEFT lower lobe atelectasis with effusion. Electronically Signed   By: Staci Righter M.D.   On: 06/28/2016 07:13   Dg Chest Port 1 View  Result Date: 06/27/2016 CLINICAL DATA:  ARDS. EXAM: PORTABLE CHEST 1 VIEW COMPARISON:  Jun 26, 2016 FINDINGS: The right PICC line is stable. The NG tube terminates below today's study. Diffuse bilateral primarily interstitial opacities are mildly more homogeneous in less patchy in appearance. More focal opacity in the left base is stable. No other changes. IMPRESSION: 1. Diffuse bilateral primarily interstitial opacities suggesting edema remain. More focal opacity in the left base is stable to mildly improved. Electronically Signed   By: Dorise Bullion III M.D   On: 06/27/2016 07:21   Dg Chest Port 1 View  Result Date: 06/26/2016 CLINICAL DATA:  Shortness of breath. EXAM: PORTABLE CHEST 1 VIEW COMPARISON:  Radiograph of Jun 25, 2016. FINDINGS: Stable cardiomegaly. Increased bilateral diffuse interstitial and basilar opacities are noted concerning for worsening edema. No pneumothorax is noted. Mild left pleural effusion is noted with associated atelectasis or infiltrate. Right-sided PICC line is unchanged in position. Nasogastric tube is unchanged in position. Bony thorax is unremarkable. IMPRESSION: Increased bilateral diffuse interstitial densities consistent with edema. Mild left pleural effusion is noted with associated atelectasis or infiltrate. Electronically Signed   By: Marijo Conception, M.D.   On: 06/26/2016 07:40   Dg Chest Port 1 View  Result Date: 06/25/2016 CLINICAL DATA:  Shortness of Breath EXAM: PORTABLE CHEST 1 VIEW COMPARISON:  06/24/2016 FINDINGS: Cardiac shadow is prominent but stable. Right-sided PICC line and nasogastric catheter are again seen and stable.  Bibasilar atelectatic changes are noted worse on the left than the right and increased from the prior exam. Small left pleural effusion is noted as well. Mild vascular congestion is seen. IMPRESSION: Increasing bibasilar infiltrates left greater than right. Mild vascular congestion is noted. The Electronically Signed   By: Linus Mako.D.  On: 06/25/2016 07:54   Dg Chest Port 1 View  Result Date: 06/24/2016 CLINICAL DATA:  Shortness of Breath EXAM: PORTABLE CHEST 1 VIEW COMPARISON:  06/23/2016 FINDINGS: Right-sided PICC line is again noted at the cavoatrial junction. Nasogastric catheter is noted in satisfactory position through the overall inspiratory effort is decreased from the prior exam. Mild left basilar atelectasis remains. No other focal abnormality is noted. IMPRESSION: Stable left basilar atelectasis. Electronically Signed   By: Inez Catalina M.D.   On: 06/24/2016 08:07   Dg Chest Port 1 View  Result Date: 06/23/2016 CLINICAL DATA:  61 year old female with shortness of breath and abdominal pain. 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 Being treated conservatively at this time, including with bowel rest. EXAM: PORTABLE CHEST 1 VIEW COMPARISON:  Chest radiographs 06/22/2016 and earlier. CTA 06/20/2016 FINDINGS: Portable AP semi upright view at 0754 hours. Stable enteric tube which courses to the abdomen. Stable right PICC line. Mildly larger lung volumes and decreased streaky bibasilar opacity. Mild residual. No pneumothorax or pulmonary edema. No pleural effusion or consolidation. Stable cardiac size and mediastinal contours. IMPRESSION: 1.  Stable lines and tubes. 2. Mildly improved lung volumes and regressed bibasilar opacity favored to be atelectasis. Electronically Signed   By: Genevie Ann M.D.   On: 06/23/2016 08:19   Dg Chest Port 1 View  Result Date: 06/22/2016 CLINICAL DATA:   61 year old female with history of abdominal aortic aneurysm. Shortness of breath and vomiting. EXAM: PORTABLE CHEST 1 VIEW COMPARISON:  Chest x-ray 06/21/2016. FINDINGS: There is a right upper extremity PICC with tip terminating in the superior cavoatrial junction. A nasogastric tube is seen extending into the stomach, however, the tip of the nasogastric tube extends below the lower margin of the image. Lung volumes are low. Linear bibasilar opacities favored to reflect areas of subsegmental atelectasis, however, underlying airspace consolidation from infection or aspiration is not excluded. No pleural effusions. No evidence of pulmonary edema. Heart size is normal. Upper mediastinal contours are within normal limits. IMPRESSION: 1. Support apparatus, as above. 2. Low lung volumes with bibasilar areas of atelectasis and/or airspace consolidation. Electronically Signed   By: Vinnie Langton M.D.   On: 06/22/2016 07:30   Dg Chest Port 1 View  Result Date: 06/21/2016 CLINICAL DATA:  Aortic dissection EXAM: PORTABLE CHEST 1 VIEW COMPARISON:  06/20/2016 FINDINGS: Bibasilar atelectasis. Heart is normal size. No effusions or acute bony abnormality. IMPRESSION: Bibasilar atelectasis. Electronically Signed   By: Rolm Baptise M.D.   On: 06/21/2016 07:18   Dg Chest Port 1 View  Result Date: 06/20/2016 CLINICAL DATA:  Hypoxia, unresponsive EXAM: PORTABLE CHEST 1 VIEW COMPARISON:  CTA chest dated 06/20/2016 FINDINGS: Lungs are essentially clear. No focal consolidation. No pleural effusion or pneumothorax. The heart is top-normal in size. IMPRESSION: No evidence of acute cardiopulmonary disease. Electronically Signed   By: Julian Hy M.D.   On: 06/20/2016 17:01   Dg Chest Port 1 View  Result Date: 06/20/2016 CLINICAL DATA:  Sudden onset sharp substernal chest pain that radiates to the back. Nausea, dizziness and shortness of breath. EXAM: PORTABLE CHEST 1 VIEW COMPARISON:  None. FINDINGS: Trachea is midline.  Heart size is accentuated by AP supine technique. Probable mild scarring in the right middle lobe and lingula. Lungs are otherwise clear. No pleural fluid. IMPRESSION: No acute findings. Electronically Signed   By: Lorin Picket M.D.   On: 06/20/2016 09:56  Dg Abd Portable 1v  Result Date: 07/10/2016 CLINICAL DATA:  Ileus.  Aortic dissection. EXAM: PORTABLE ABDOMEN - 1 VIEW COMPARISON:  CT scan July 07, 2016.  KUB July 08, 2016 FINDINGS: Persistent small bowel dilatation measuring up to 4.5 cm, mildly more prominent in the interval. The transverse colon is also air-filled but nondilated. The descending colon is relatively decompressed. An NG tube terminates in the stomach. No free air, portal venous gas, or pneumatosis on supine imaging. IMPRESSION: 1. The dilated small bowel loops are mildly more prominent in the interval. Electronically Signed   By: Dorise Bullion III M.D   On: 07/10/2016 07:28   Dg Abd Portable 1v  Result Date: 07/08/2016 CLINICAL DATA:  Abdominal distention EXAM: PORTABLE ABDOMEN - 1 VIEW COMPARISON:  July 07, 2016 FINDINGS: There may be slightly less bowel dilatation compared to 1 day prior. No air-fluid levels evident. No free air. Nasogastric tube tip and side port remain in stomach. IMPRESSION: Probable ileus with marginally less bowel dilatation compared to 1 day prior. No free air. Nasogastric tube tip and side port in stomach. Electronically Signed   By: Lowella Grip III M.D.   On: 07/08/2016 08:00   Dg Abd Portable 1v  Result Date: 07/07/2016 CLINICAL DATA:  Feeding tube placement. EXAM: PORTABLE ABDOMEN - 1 VIEW COMPARISON:  07/06/2016 FINDINGS: Interval placement of enteric tube which enters the stomach where it is coiled once and has tip and side-port over the gastric fundus just below the left hemidiaphragm. Air-filled loops of large and small bowel with dilated small bowel loop in the left abdomen measuring 5.7 cm in diameter as findings are not significantly changed.  Remainder of the exam is unchanged. IMPRESSION: Persistent air-filled dilated small bowel with air throughout the colon. Nasogastric tube coiled once within the stomach with tip and side-port over the gastric fundus just below the left hemidiaphragm. Electronically Signed   By: Marin Olp M.D.   On: 07/07/2016 19:16   Dg Abd Portable 1v  Result Date: 07/06/2016 CLINICAL DATA:  Multiple episodes of vomiting, abdominal pain and distention. EXAM: PORTABLE ABDOMEN - 1 VIEW COMPARISON:  07/04/2016 FINDINGS: There is persistent dilation and wall edema of small bowel loops, with featureless appearance of some of the visualized small bowel loops. No radiographic evidence of organomegaly or free gas on this limited supine radiograph. IMPRESSION: Persistent dilation/wall edema and featureless appearance of small bowel loops in a nonspecific pattern. These findings may be seen with ileus, incomplete small bowel obstruction or enteritis. Ischemic enteritis continues to be of consideration. Electronically Signed   By: Fidela Salisbury M.D.   On: 07/06/2016 16:14   Dg Abd Portable 1v  Result Date: 07/04/2016 CLINICAL DATA:  Aortic dissection. EXAM: PORTABLE ABDOMEN - 1 VIEW COMPARISON:  07/02/2016 FINDINGS: Nasogastric tube tip in the region of the antrum/pylorus. Persistent group of dilated small bowel loops in the left mid abdomen with possible bowel edema. Possibility of bowel ischemia certainly does exist. Similar appearance to the study of 07/02/2016. IMPRESSION: Nasogastric tube in place. Otherwise similar appearance with abnormal small bowel pattern in the left central abdomen which could go along with partial small bowel obstruction or a focal enteritis including ischemic. Electronically Signed   By: Nelson Chimes M.D.   On: 07/04/2016 08:13   Dg Abd Portable 1v  Result Date: 07/02/2016 CLINICAL DATA:  Nasogastric tube removed.  Acute abdominal pain. EXAM: PORTABLE ABDOMEN - 1 VIEW COMPARISON:  06/27/2016  FINDINGS: Nasogastric tube is been removed. There  is a group of dilated fluid and air-filled loops of small intestine in the left abdomen with apparent wall thickening. This could be due to partial obstruction or enteritis, including ischemic bowel insult. IMPRESSION: Nasogastric tube removed. Abnormal dilated small intestine in the left central abdomen. Differential diagnosis partial small bowel obstruction versus enteritis, including ischemic. Electronically Signed   By: Nelson Chimes M.D.   On: 07/02/2016 07:37   Dg Abd Portable 1v  Result Date: 06/27/2016 CLINICAL DATA:  Ileus. EXAM: PORTABLE ABDOMEN - 1 VIEW COMPARISON:  Jun 26, 2016 FINDINGS: The NG tube terminates within the stomach. The bowel gas pattern is unremarkable on today's study. No other acute abnormalities. IMPRESSION: The NG tube terminates within the stomach. The bowel gas pattern is unremarkable. Electronically Signed   By: Dorise Bullion III M.D   On: 06/27/2016 07:19   Dg Abd Portable 1v  Result Date: 06/26/2016 CLINICAL DATA:  Shortness of breath, abdominal pain EXAM: PORTABLE ABDOMEN - 1 VIEW COMPARISON:  06/25/2016 FINDINGS: NG tube tip is in the distal stomach. Prior cholecystectomy. Nonobstructive bowel gas pattern. No free air organomegaly. IMPRESSION: NG tube tip in the distal stomach.  No acute findings. Electronically Signed   By: Rolm Baptise M.D.   On: 06/26/2016 07:36   Dg Abd Portable 1v  Result Date: 06/25/2016 CLINICAL DATA:  Shortness of breath.  Abdominal pain. EXAM: PORTABLE ABDOMEN - 1 VIEW COMPARISON:  Jun 24, 2016 FINDINGS: The distal tip of the NG tube is near the gastric antrum. Mild opacity in left lung base will be better assessed on today's chest x-ray. No free air or portal venous gas identified although evaluation is limited on supine imaging. No bowel obstruction. IMPRESSION: No interval change or acute abnormality in the abdomen. Electronically Signed   By: Dorise Bullion III M.D   On: 06/25/2016  07:54   Dg Abd Portable 1v  Result Date: 06/24/2016 CLINICAL DATA:  Abdominal distention. EXAM: PORTABLE ABDOMEN - 1 VIEW COMPARISON:  06/23/2016 . FINDINGS: NG tube noted in stable position with tip in the stomach . Surgical clips right upper quadrant. Several nonspecific loops of air-filled small bowel again noted. Colonic gas pattern is normal. No free air. IMPRESSION: 1.  NG tube in stable position. 2. Several nonspecific loops of air-filled small bowel again noted. No evidence of progressive bowel distention. Colonic gas pattern normal. No free air. Electronically Signed   By: Marcello Moores  Register   On: 06/24/2016 08:06   Dg Abd Portable 1v  Result Date: 06/23/2016 CLINICAL DATA:  61 year old female with shortness of breath and abdominal pain. 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 Being treated conservatively at this time, including with bowel rest. EXAM: PORTABLE ABDOMEN - 1 VIEW COMPARISON:  Abdominal radiographs 06/22/2016. CTA 06/20/2016, and earlier. FINDINGS: Portable AP supine view at 0759 hours. Stable NG tube, side hole to level of the gastric body and tip at the level of the gastric antrum. Stable cholecystectomy clips. Stable bowel gas pattern with several gas containing nondilated small and large bowel loops. No definite pneumoperitoneum on this supine view. Abdominal and pelvic visceral contours appear stable. No acute osseous abnormality identified. IMPRESSION: 1. Stable NG tube position. 2. Stable, nonobstructed bowel-gas pattern. Electronically Signed   By: Genevie Ann M.D.   On: 06/23/2016 08:18   Dg Abd Portable 1v  Result Date: 06/22/2016 CLINICAL DATA:  Abdominal pain for few days with nausea  and vomiting. EXAM: PORTABLE ABDOMEN - 1 VIEW COMPARISON:  06/22/2016 abdominal radiograph. FINDINGS: Enteric tube loops in the gastric fundus and terminates in the body of the  stomach, without appreciable kink. Top-normal caliber small bowel loops throughout the abdomen. Minimal colonic stool. No evidence of pneumatosis or pneumoperitoneum. No radiopaque urolithiasis. Cholecystectomy clips are seen in the right upper quadrant of the abdomen. Patchy opacities at the lung bases. IMPRESSION: 1. Enteric tube loops in the gastric fundus and terminates in the body of the stomach without appreciable kink. 2. Top-normal caliber small bowel loops, unchanged. 3. Patchy bibasilar lung opacities, correlate with chest radiograph. Electronically Signed   By: Ilona Sorrel M.D.   On: 06/22/2016 16:58   Dg Abd Portable 1v  Result Date: 06/22/2016 CLINICAL DATA:  Vomiting.  Shortness of breath.  AAA . EXAM: PORTABLE ABDOMEN - 1 VIEW COMPARISON:  06/21/2016. FINDINGS: Surgical clips right upper quadrant. NG tube noted with tip over the distal stomach. No bowel distention. No acute bony abnormality identified. Mild basilar atelectasis. IMPRESSION: NG tube noted with its tip over the distal stomach. No bowel distention or acute intra-abdominal abnormality identified. Electronically Signed   By: Marcello Moores  Register   On: 06/22/2016 07:30   Dg Abd Portable 1v  Result Date: 06/21/2016 CLINICAL DATA:  Feeding tube placement EXAM: PORTABLE ABDOMEN - 1 VIEW COMPARISON:  06/21/2016 FINDINGS: NG tube coils in the fundus of the stomach with the tip in the distal stomach. Decompression of the stomach. IMPRESSION: NG tube tip in the distal stomach. Electronically Signed   By: Rolm Baptise M.D.   On: 06/21/2016 15:46   Dg Abd Portable 1v  Result Date: 06/21/2016 CLINICAL DATA:  Ileus  Vomiting that started this AM per patient EXAM: PORTABLE ABDOMEN - 1 VIEW COMPARISON:  06/20/2016 FINDINGS: Gaseous distention of the stomach. Paucity of small bowel and colonic gas. Cholecystectomy clips. Linear scarring/ atelectasis in the lung bases. IMPRESSION: 1. Gaseous distention of the stomach. Electronically Signed   By: Lucrezia Europe M.D.   On: 06/21/2016 11:54   Dg Abd Portable 1v  Result Date: 06/20/2016 CLINICAL DATA:  Aortic dissection EXAM: PORTABLE ABDOMEN - 1 VIEW COMPARISON:  CT abdomen/ pelvis dated 06/20/2016 FINDINGS: Nonobstructive bowel gas pattern. Visualized osseous structures are within normal limits. IMPRESSION: Unremarkable abdominal radiograph. Electronically Signed   By: Julian Hy M.D.   On: 06/20/2016 17:02   Ct Angio Chest/abd/pel For Dissection W And/or W/wo  Result Date: 06/27/2016 CLINICAL DATA:  61 year old female with a history of type B dissection EXAM: CT ANGIOGRAPHY CHEST, ABDOMEN AND PELVIS TECHNIQUE: Multidetector CT imaging through the chest, abdomen and pelvis was performed using the standard protocol during bolus administration of intravenous contrast. Multiplanar reconstructed images and MIPs were obtained and reviewed to evaluate the vascular anatomy. CONTRAST:  100 cc Isovue 370 COMPARISON:  CT 06/20/2016 FINDINGS: CTA CHEST FINDINGS Cardiovascular: Heart: Heart size unchanged. No pericardial fluid/ thickening. No significant coronary calcifications. Aorta: Re- demonstration of type B dissection with the entry tear appearing to originate just beyond the origin of the left subclavian artery. Branch vessels remain patent without extension of the dissection flap into the branch vessels. Caliber and contour of the ascending aorta unremarkable without evidence of retrograde extension. Greatest diameter of the ascending aorta 2.9 cm. Inflammatory changes surrounding the distal aortic arch in the proximal descending aorta. Greatest diameter of the distal aortic arch measures approximately 3.4 cm. Greatest diameter on the comparison CT approximately 3.0 cm. No aneurysm of the  descending thoracic aorta with the greatest diameter of the false lumen measuring 2.7 cm. True lumen is compressed. There is a fenestration in the distal true lumen at the level of the diaphragm just above the aortic  hiatus. Pulmonary arteries: No lobar, segmental, or proximal subsegmental filling defects. Mediastinum/Nodes: No mediastinal hemorrhage. Mediastinal lymph nodes are present. Gastric tube within the esophagus. Lungs/Pleura: Ground-glass opacities developing through the the bilateral lungs. Developing interlobular septal thickening. Atelectasis of the medial segment left lower lobe. Small low-density left pleural effusion trace right-sided pleural effusion and associated atelectasis. No pneumothorax. Right upper extremity PICC appears to terminate superior vena cava. Review of the MIP images confirms the above findings. CTA ABDOMEN AND PELVIS FINDINGS VASCULAR Aorta: Re- demonstration of dissection flap of the abdominal aorta. No aneurysm.  No periaortic fluid of the abdomen. The true lumen is compressed throughout the abdomen. True lumen contributes to celiac artery origin, superior mesenteric artery origin, and also continues across the origin of the inferior mesenteric artery. The false lumen contributes to perfusion of the right renal artery. The lateral margin of the dissection flap involves the origin of 2 left renal arteries both superior and inferior. True lumen is decompressed in the inferior aorta extending into the bilateral iliac arteries. The bilateral common iliac arteries appear perfused from the false lumen bilaterally. False lumen extends in the bilateral external iliac artery. Likely re- entry tear of distal external iliac arteries bilaterally, on the right image 173, on the left image 171. Dissection flap extends into the bilateral hypogastric arteries which are partially patent with decreased flow. Celiac: Origin of the celiac artery originates from the true lumen which is decompressed. Celiac artery remains patent at this time including the branch vessels. Typical branch pattern of splenic artery, left gastric artery, common hepatic artery. SMA: Superior mesenteric artery origin originates from  the true lumen which is compressed. SMA remains patent at this time. Renals: Right renal artery originates from the false lumen. Right renal artery is perfusing at this time with uniform perfusion of the right kidney. There are 2 left renal arteries, superior and inferior. Both arteries originate near the margin of the dissection flap. The superior left renal artery is partially filling, at the in flexion point of the dissection flap, perfusing superior kidney. The inferior left renal artery appears thrombosed, new from the comparison with enlarging left renal infarction, predominantly of the lower pole. IMA: Origin of the inferior mesenteric artery is thrombosed given the collapsed true lumen at this level. There is re- constitution of the distal inferior mesenteric artery via collateral flow. Right lower extremity: False lumen perfuses the right common iliac artery with collapse of the true lumen. Dissection flap extends into the hypogastric artery, with the distal pelvic branch is partially opacifying. External iliac artery is perfusing from the false lumen, with unremarkable appearance of the distal external iliac artery, common femoral artery, profunda femoris, SFA. There is the appearance of a re- entry tear in the mid right external iliac artery. Left lower extremity: False lumen perfuses the left common iliac artery with collapse of the true lumen. Dissection extends into the hypogastric artery which is partially perfused with opacification of pelvic vessels. Distal external iliac artery an the common femoral artery unremarkable, with patent proximal femoral vessels. There is the appearance of a re- entry tear in the mid left external iliac artery. Veins: Unremarkable appearance of the venous system. Review of the MIP images confirms the above findings. NON-VASCULAR Hepatobiliary: Unremarkable appearance of the  liver. Cholecystectomy Pancreas: Unremarkable appearance of the pancreas. No pericholecystic fluid  or inflammatory changes. Unremarkable ductal system. Spleen: Unremarkable. Adrenals/Urinary Tract: Right adrenal gland unremarkable. Nodule of the left adrenal gland which measures 2.7 cm. Right: Right-sided kidney perfuses uniformly with no hydronephrosis. Left: Progressive left renal infarct with small portion of the superior kidney perfusing. The remainder of the left kidney is hypoperfused, progressed from the comparison. No hydronephrosis. Urinary catheter within the urinary bladder. Stomach/Bowel: Unremarkable appearance of stomach with gastric tube in place. Small bowel borderline dilated and fluid-filled. No transition point. The wall of the small bowel relatively uniformly thin and enhancing, although the study is timed for the arterial phase. No focal wall thickening. No abnormally distended colon. No transition point. Fluid filled colon. No focal wall thickening or pericolonic inflammatory changes. Lymphatic: Multiple lymph nodes in the para-aortic nodal station, none of which are enlarged. Mesenteric: No free fluid or air. No adenopathy. Reproductive: Hysterectomy Other: No hernia. Musculoskeletal: No displaced fracture. Degenerative changes of the spine. 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 Only 1 possible fenestration is identified, in the distal thoracic aorta above the aortic hiatus. There is enlarging diameter of the distal aortic arch with associated inflammatory changes, measuring approximately 3.5 on today's study compared to approximately 3.1 cm previously. The appearance is concerning for progression/expansion of intramural hematoma. Progressing left renal infarction, with near complete thrombosis of superior and inferior renal arteries, both of which originate at the margin of the dissection flap. Bilateral common iliac arteries appear to be perfused from the false lumen, with complete collapse of true lumen  proximally. There does appear to be re- entry to the true lumen in the mid external iliac artery bilaterally, as there is unremarkable appearance of the proximal femoral vasculature including bilateral common iliac arteries. The 3 mesenteric vessels originate from the small true lumen, which is progressively compressed. Celiac artery and superior mesenteric artery remain patent, with occlusion at the origin of the inferior mesenteric artery. Three vessel arch, with all 3 branches remain patent. No evidence of extension of the dissection flap into the branch vessels. These preliminary results were discussed by telephone at the time of interpretation on 06/27/2016 at 12:41 pm with Dr. Curt Jews. Borderline dilated small bowel without transition point or obstruction. Findings may represent ileus and/or nonspecific enteritis. No focal wall thickening to suggest Avayah Raffety ischemia, although the timing of this CTA is specific for arterial evaluation and not the bowel tissues. If ongoing concern for bowel ischemia, would repeat abd/pelvis contrast enhanced-CT portal venous phase. Similar appearance of mixed geographic ground-glass opacities of the bilateral lungs with mild interlobular septal thickening. Again, differential diagnosis includes pulmonary edema, ARDS, atypical infection. Signed, Dulcy Fanny. Earleen Newport, DO [facility] Radiology Electronically Signed   By: Corrie Mckusick D.O.   On: 06/27/2016 13:18   Ct Angio Chest/abd/pel For Dissection W And/or W/wo  Result Date: 06/20/2016 CLINICAL DATA:  Substernal chest pain radiating to back EXAM: CT ANGIOGRAPHY CHEST, ABDOMEN AND PELVIS TECHNIQUE: Multidetector CT imaging through the chest, abdomen and pelvis was performed using the standard protocol during bolus administration of intravenous contrast. Multiplanar reconstructed images and MIPs were obtained and reviewed to evaluate the vascular anatomy. CONTRAST:  100 cc Isovue  370 IV COMPARISON:  CT abdomen and pelvis 10/27/2011 FINDINGS: CTA CHEST FINDINGS Cardiovascular: There is the type B aortic dissection beginning in the distal aortic arch just beyond the origin  of the great vessels. The true lumen is compressed by the false lumen. No aneurysm. No pulmonary embolus. Heart is normal size. Mediastinum/Nodes: No mediastinal, hilar, or axillary adenopathy. Lungs/Pleura: Atelectasis or scarring in the lingula. Lungs otherwise clear. No effusions. Musculoskeletal: No acute bony abnormality. Review of the MIP images confirms the above findings. CTA ABDOMEN AND PELVIS FINDINGS VASCULAR Aorta: Dissection continues throughout the abdominal aorta with the celiac artery and superior mesenteric artery arising from the true lumen anteriorly. The dissection may extend into the left renal artery where there is only a small amount of blood flow noted in the proximal and mid left renal artery. Right renal artery appears to arise from the false lumen and is patent. The the true lumen appears thrombosed below the origin of the superior mesenteric artery. Inferior mesenteric artery arises from the thrombosed true lumen and appears occluded proximally, reconstitutes several cm from the origin. Celiac: Patent. SMA: Patent. Renals: As above. IMA: As above. Inflow: To the dissection continues into both common iliac arteries with thrombosed true lumens. The dissection appears to terminate at approximately the level of the common iliac bifurcation bilaterally. Veins: Grossly patent and unremarkable. Review of the MIP images confirms the above findings. NON-VASCULAR Hepatobiliary: Mild diffuse fatty infiltration. Prior cholecystectomy. Pancreas: No focal abnormality or ductal dilatation. Spleen: No focal abnormality.  Normal size. Adrenals/Urinary Tract: Nodules in the left adrenal gland are low-density on the precontrast imaging compatible with small adenomas. There are areas of non perfusion noted in the mid  and lower poles of the left kidney compatible with infarction, likely related to the involvement of the left renal artery by dissection. Urinary bladder unremarkable. Stomach/Bowel: Stomach, large and small bowel grossly unremarkable. Lymphatic: No adenopathy. Reproductive: Prior hysterectomy.  No adnexal masses. Other: No free fluid or free air. Musculoskeletal: No acute bony abnormality. Review of the MIP images confirms the above findings. 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 This involves the descending thoracic aorta, abdominal aorta and iliac vessels. The true lumen is compressed by the larger false lumen, and is thrombosed below the SMA/renal artery origins. The dissection appears to extend into the left renal artery with partial occlusion and areas of infarct in the mid and lower pole of the left kidney. Fatty infiltration of the liver. Critical Value/emergent results were called by telephone at the time of interpretation on 06/20/2016 at 10:13 am to Dr. Fredia Sorrow , who verbally acknowledged these results. Electronically Signed   By: Rolm Baptise M.D.   On: 06/20/2016 10:15   Anti-infectives: Anti-infectives    Start     Dose/Rate Route Frequency Ordered Stop   06/26/16 0830  meropenem (MERREM) 1 g in sodium chloride 0.9 % 100 mL IVPB  Status:  Discontinued     1 g 200 mL/hr over 30 Minutes Intravenous Every 8 hours 06/26/16 0810 07/06/16 0804   06/22/16 1800  clindamycin (CLEOCIN) IVPB 600 mg  Status:  Discontinued     600 mg 100 mL/hr over 30 Minutes Intravenous Every 8 hours 06/22/16 1723 06/26/16 0810   06/22/16 1000  azithromycin (ZITHROMAX) 250 mg in dextrose 5 % 125 mL IVPB  Status:  Discontinued     250 mg 125 mL/hr over 60 Minutes Intravenous Every 24 hours 06/22/16 0826 06/27/16 1019   06/22/16 0900  levofloxacin (LEVAQUIN) IVPB 500 mg  Status:  Discontinued     500 mg 100 mL/hr over 60 Minutes  Intravenous  Every 24 hours 06/22/16 0759 06/22/16 0824      Assessment/Plan: s/p * No surgery found * Discussed with patient. Hopefully will continue to resolve ileus. I will be out of town over the weekend. Will see again Monday. Please call my partner over the weekend for vascular issues.   LOS: 20 days   Haley Graham 07/10/2016, 10:10 AM

## 2016-07-10 NOTE — Progress Notes (Signed)
  PHARMACY - ADULT TOTAL PARENTERAL NUTRITION CONSULT NOTE   Pharmacy Consult for TPN Indication: prolonged NPO status/high NG output   Patient Measurements: Height: '5\' 3"'$  (160 cm) Weight: 175 lb 11.3 oz (79.7 kg) IBW/kg (Calculated) : 52.4 TPN AdjBW (KG): 59.4 Body mass index is 31.13 kg/m. 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 Patient had a significant hx of N/V for 2-4 weeks prior to admission. Weight appears to have been maintained. Patient had a NG tube placed 5/20 which had significant output with 1-1.5 L a day, removed NG tube on 5/30, however, patient with significant episodes of N&V and NG replaced 6/1 with continuous low suction. NG tube removed but multiple episodes of vomiting with CL trial 6/4 & NG tube replaced again 6/5.    GI: Ileus vs gastroparesis. Repeat CT 6/5- (-)perf/pneumatosis, unresolved SB dilatation. NGT clamped 6/7 - tolerating.  Liquid stools. Advancing to full liquids today. Phenergan Q 6 prn, ondansetron prn, bisacodyl, simethicone, PPI-IV Endo: Hx of DM. No insulin at home. CBGs 132-176, improved.  No hypoglycemic events.  Insulin requirements in the past 24 hours: 16 units SSI (10 since new tpn); 80 units of Lantus (split 40 BID), 30 units in tpn (increased 6/6) Lytes:  K down to 3.2, Mg replaced 6/7. Lasix '40mg'$  IV qday. Phos 4.1 on 6/7.  Renal:AKI:  Cr 1.03 improved  (Baseline < 1). UOP 1 cc/kg/hr on Lasix, I/O (+)276, Net -9.4L.  Pulm: 2L Empire; Dulera; Brovana, Albuterol  Cards: Stanford type B aortic dissection; BP soft, HR 90s in NSR. Metoprolol IV, clonidine 0.3 mg patch , nitroglycerin 0.'4mg'$  patch, lasix IV qday, hydralazine IV.  Hepatobil:AST & ALT now improved to wnl.  Alk phos down to 128, Tbili wnl.  Triglycerides 168 stable.  PAlb 9 >> 17.7- marked improvement Neuro: Fentanyl prn ID: S/P Meropenem (5/25-6/4) for respiratory failure from PNA. WBC 12 improved  (peaked at 36.7).  Afebrile.  All cx negative, CDiff neg.  Best Practices:PPI, mc TPN Access:PICC line placed 5/20 TPN start date:5/22  Nutritional Goals (per RD recommendation on 5/25): KCal: 2000-2200 kcal/day  Protein: 110-120 gm/day   Goal  TPN rate: Clinimix 5/15 E @ 83 ml/hr + lipid emulsion 20 ml/hr   Current Nutrition:   TPN  Plan:  Continue Clinimix E 5/15 at 83 ml/hr Continue lipid emulsion 20% at 20 ml/hr  This provides 100 g of protein and 1894 kCals per day meeting 91% of protein and 95% of kCal needs Continue MVI in TPN Continue TE in TPN every other day, next 6/9 Continue Insulin 30 units in TPN and adjust further as needed Continue Lantus 40 units BID Continue TCTS SSI q4h K runs x 4 Repeat BMET and Mg in AM TPN labs qMon/Thurs Follow-up enteral diet progression and ability to wean TPN   Sloan Leiter, PharmD, BCPS Clinical Pharmacist Clinical Phone 07/10/2016 until 3:30 PM - #[phone] After hours, please call [phone] .

## 2016-07-10 NOTE — Progress Notes (Signed)
Patient ID: Haley Graham, female   DOB: 1955/06/13, 61 y.o.   MRN: [id number] SICU Evening Rounds:  Hemodynamically stable in sinus rhythm.  Good urine output  NGT clamping schedule initiated.

## 2016-07-10 NOTE — Progress Notes (Signed)
[facility] Progress Note    Subjective: Patient has tolerated tube being clamped.  Objective: Vital signs in last 24 hours: Temp:  [98.3 F (36.8 C)-99.5 F (37.5 C)] 98.8 F (37.1 C) (06/08 0800) Pulse Rate:  [88-101] 93 (06/08 0800) Resp:  [15-24] 19 (06/08 0800) BP: (112-158)/(54-91) 128/62 (06/08 0800) SpO2:  [89 %-100 %] 93 % (06/08 0813) Weight:  [79.7 kg (175 lb 11.3 oz)] 79.7 kg (175 lb 11.3 oz) (06/08 0500) Last BM Date: 07/09/16  Intake/Output from previous day: 06/07 0701 - 06/08 0700 In: 2701.2 [I.V.:2341.2; NG/GT:360] Out: 2425 [Urine:1875; Emesis/NG output:175; Stool:375] Intake/Output this shift: No intake/output data recorded.  General: Walking in the hallways with PT  Lungs: Clear  Abd: Soft, good bowel sounds.  Extremities: No changes  Neuro: Intact  Lab Results:  @LABLAST2 (wbc:2,hgb:2,hct:2,plt:2) BMET ) Recent Labs  07/09/16 0553 07/10/16 0530  NA 138 135  K 3.5 3.2*  CL 102 103  CO2 26 24  GLUCOSE 153* 151*  BUN 38* 36*  CREATININE 1.11* 1.03*  CALCIUM 8.3* 8.1*   PT/INR No results for input(s): LABPROT, INR in the last 72 hours. ABG No results for input(s): PHART, HCO3 in the last 72 hours.  Invalid input(s): PCO2, PO2  Studies/Results: Dg Abd Portable 1v  Result Date: 07/10/2016 CLINICAL DATA:  Ileus.  Aortic dissection. EXAM: PORTABLE ABDOMEN - 1 VIEW COMPARISON:  CT scan July 07, 2016.  KUB July 08, 2016 FINDINGS: Persistent small bowel dilatation measuring up to 4.5 cm, mildly more prominent in the interval. The transverse colon is also air-filled but nondilated. The descending colon is relatively decompressed. An NG tube terminates in the stomach. No free air, portal venous gas, or pneumatosis on supine imaging. IMPRESSION: 1. The dilated small bowel loops are mildly more prominent in the interval. Electronically Signed   By: Dorise Bullion III M.D   On: 07/10/2016 07:28    Anti-infectives: Anti-infectives    Start      Dose/Rate Route Frequency Ordered Stop   06/26/16 0830  meropenem (MERREM) 1 g in sodium chloride 0.9 % 100 mL IVPB  Status:  Discontinued     1 g 200 mL/hr over 30 Minutes Intravenous Every 8 hours 06/26/16 0810 07/06/16 0804   06/22/16 1800  clindamycin (CLEOCIN) IVPB 600 mg  Status:  Discontinued     600 mg 100 mL/hr over 30 Minutes Intravenous Every 8 hours 06/22/16 1723 06/26/16 0810   06/22/16 1000  azithromycin (ZITHROMAX) 250 mg in dextrose 5 % 125 mL IVPB  Status:  Discontinued     250 mg 125 mL/hr over 60 Minutes Intravenous Every 24 hours 06/22/16 0826 06/27/16 1019   06/22/16 0900  levofloxacin (LEVAQUIN) IVPB 500 mg  Status:  Discontinued     500 mg 100 mL/hr over 60 Minutes Intravenous Every 24 hours 06/22/16 0759 06/22/16 0824      Assessment/Plan: s/p  Advance diet Full liquids only.  Patient prefers clear liquids.  LOS: 20 days   Kathryne Eriksson. Dahlia Bailiff, MD, FACS [phone] [facility] 07/10/2016

## 2016-07-10 NOTE — Progress Notes (Signed)
  Subjective: Patient had NG tube clamped for 20 hours and a.m. KUB shows definite increase in ileus with dilated small bowel loops NG was placed to suction and she will have 4 hour clamping trials to hopefully allow return of  bowel function. We will also wean her off her narcotics and be very forceful on having her walk at least twice a day Continue T NA for now because of poor bowel function-motility Objective: Vital signs in last 24 hours: Temp:  [97.9 F (36.6 C)-99.5 F (37.5 C)] 97.9 F (36.6 C) (06/08 1548) Pulse Rate:  [88-100] 98 (06/08 1500) Cardiac Rhythm: Normal sinus rhythm;Sinus tachycardia (06/08 1330) Resp:  [15-24] 20 (06/08 1400) BP: (107-158)/(49-91) 107/57 (06/08 1500) SpO2:  [89 %-100 %] 96 % (06/08 1500) Weight:  [175 lb 11.3 oz (79.7 kg)] 175 lb 11.3 oz (79.7 kg) (06/08 0500)  Hemodynamic parameters for last 24 hours:  blood pressure stable, sinus rhythm, afebrile  Intake/Output from previous day: 06/07 0701 - 06/08 0700 In: 2701.2 [I.V.:2341.2; NG/GT:360] Out: 2425 [Urine:1875; Emesis/NG output:175; Stool:375] Intake/Output this shift: Total I/O In: 808 [P.O.:50; I.V.:558; IV Piggyback:200] Out: 1525 [Urine:1175; Emesis/NG output:50; Stool:300]       Exam    General- alert and comfortable   Lungs- clear without rales, wheezes   Cor- regular rate and rhythm, no murmur , gallop   Abdomen- soft, non-tender   Extremities - warm, non-tender, minimal edema   Neuro- oriented, appropriate, no focal weakness   Lab Results:  Recent Labs  07/08/16 0430 07/10/16 0530  WBC 11.6* 12.0*  HGB 9.7* 9.6*  HCT 31.4* 30.9*  PLT 256 181   BMET:  Recent Labs  07/09/16 0553 07/10/16 0530  NA 138 135  K 3.5 3.2*  CL 102 103  CO2 26 24  GLUCOSE 153* 151*  BUN 38* 36*  CREATININE 1.11* 1.03*  CALCIUM 8.3* 8.1*    PT/INR: No results for input(s): LABPROT, INR in the last 72 hours. ABG    Component Value Date/Time   PHART 7.430 06/28/2016 1615   HCO3 20.7 06/28/2016 1615   TCO2 22 06/28/2016 1615   ACIDBASEDEF 3.0 (H) 06/28/2016 1615   O2SAT 93.0 06/28/2016 1615   CBG (last 3)   Recent Labs  07/10/16 0800 07/10/16 1108 07/10/16 1544  GLUCAP 132* 164* 168*    Assessment/Plan: S/P  Stanford type B aortic dissection Taper off narcotics, twice a day ambulation, NG tube clamping periods of 4 hours, continue T NA   LOS: 20 days    [name] 07/10/2016

## 2016-07-11 ENCOUNTER — Inpatient Hospital Stay (HOSPITAL_COMMUNITY): Payer: BLUE CROSS/BLUE SHIELD

## 2016-07-11 LAB — URINALYSIS, ROUTINE W REFLEX MICROSCOPIC
Bilirubin Urine: NEGATIVE
Glucose, UA: NEGATIVE mg/dL
Ketones, ur: NEGATIVE mg/dL
Nitrite: NEGATIVE
PROTEIN: 30 mg/dL — AB
SPECIFIC GRAVITY, URINE: 1.02 (ref 1.005–1.030)
pH: 6 (ref 5.0–8.0)

## 2016-07-11 LAB — GLUCOSE, CAPILLARY
Glucose-Capillary: 126 mg/dL — ABNORMAL HIGH (ref 65–99)
Glucose-Capillary: 134 mg/dL — ABNORMAL HIGH (ref 65–99)
Glucose-Capillary: 143 mg/dL — ABNORMAL HIGH (ref 65–99)
Glucose-Capillary: 146 mg/dL — ABNORMAL HIGH (ref 65–99)
Glucose-Capillary: 157 mg/dL — ABNORMAL HIGH (ref 65–99)
Glucose-Capillary: 173 mg/dL — ABNORMAL HIGH (ref 65–99)

## 2016-07-11 LAB — BASIC METABOLIC PANEL
Anion gap: 8 (ref 5–15)
BUN: 31 mg/dL — ABNORMAL HIGH (ref 6–20)
CO2: 22 mmol/L (ref 22–32)
Calcium: 8.1 mg/dL — ABNORMAL LOW (ref 8.9–10.3)
Chloride: 101 mmol/L (ref 101–111)
Creatinine, Ser: 1.16 mg/dL — ABNORMAL HIGH (ref 0.44–1.00)
GFR calc Af Amer: 59 mL/min — ABNORMAL LOW (ref >60)
GFR calc non Af Amer: 50 mL/min — ABNORMAL LOW (ref >60)
Glucose, Bld: 197 mg/dL — ABNORMAL HIGH (ref 65–99)
Potassium: 3.1 mmol/L — ABNORMAL LOW (ref 3.5–5.1)
Sodium: 131 mmol/L — ABNORMAL LOW (ref 135–145)

## 2016-07-11 LAB — MAGNESIUM: Magnesium: 1.7 mg/dL (ref 1.7–2.4)

## 2016-07-11 MED ORDER — CITALOPRAM HYDROBROMIDE 20 MG PO TABS
40.0000 mg | ORAL_TABLET | Freq: Every day | ORAL | Status: DC
  Administered 2016-07-11 – 2016-08-21 (×41): 40 mg via ORAL
  Filled 2016-07-11 (×40): qty 2

## 2016-07-11 MED ORDER — POTASSIUM CHLORIDE 10 MEQ/50ML IV SOLN
10.0000 meq | INTRAVENOUS | Status: AC
  Administered 2016-07-11 (×5): 10 meq via INTRAVENOUS
  Filled 2016-07-11 (×5): qty 50

## 2016-07-11 MED ORDER — FAT EMULSION 20 % IV EMUL
240.0000 mL | INTRAVENOUS | Status: AC
  Administered 2016-07-11: 240 mL via INTRAVENOUS
  Filled 2016-07-11: qty 250

## 2016-07-11 MED ORDER — ALPRAZOLAM 0.5 MG PO TABS
0.5000 mg | ORAL_TABLET | Freq: Two times a day (BID) | ORAL | Status: DC | PRN
Start: 2016-07-11 — End: 2016-07-18
  Administered 2016-07-12 – 2016-07-18 (×8): 0.5 mg via ORAL
  Filled 2016-07-11 (×8): qty 1

## 2016-07-11 MED ORDER — MAGNESIUM SULFATE 2 GM/50ML IV SOLN
2.0000 g | Freq: Once | INTRAVENOUS | Status: AC
  Administered 2016-07-11: 2 g via INTRAVENOUS
  Filled 2016-07-11: qty 50

## 2016-07-11 MED ORDER — TRACE MINERALS CR-CU-MN-SE-ZN 10-1000-500-60 MCG/ML IV SOLN
INTRAVENOUS | Status: AC
  Administered 2016-07-11: 18:00:00 via INTRAVENOUS
  Filled 2016-07-11: qty 1992

## 2016-07-11 NOTE — Progress Notes (Signed)
  Subjective:  Ambulated 3 laps around the ICU early this am.  Passing flatus. 4 BM yesterday.  Complains of anxiety and wants to get back on her Celexa and Xanax.  Objective: Vital signs in last 24 hours: Temp:  [97.8 F (36.6 C)-99.2 F (37.3 C)] 99.2 F (37.3 C) (06/09 0828) Pulse Rate:  [89-114] 98 (06/09 0600) Cardiac Rhythm: Normal sinus rhythm;Sinus tachycardia (06/09 0400) Resp:  [15-27] 19 (06/09 0600) BP: (96-152)/(48-68) 112/56 (06/09 0600) SpO2:  [87 %-100 %] 97 % (06/09 0746) Weight:  [80.3 kg (177 lb 0.5 oz)] 80.3 kg (177 lb 0.5 oz) (06/09 0500)  Hemodynamic parameters for last 24 hours:    Intake/Output from previous day: 06/08 0701 - 06/09 0700 In: 2689 [P.O.:50; I.V.:2439; IV Piggyback:200] Out: 2200 [Urine:1850; Emesis/NG output:50; Stool:300] Intake/Output this shift: No intake/output data recorded.  General appearance: alert and cooperative Heart: regular rate and rhythm, S1, S2 normal, no murmur, click, rub or gallop Lungs: diminished breath sounds bibasilar Abdomen: hypoactive BS, distended but soft, nontender  Lab Results:  Recent Labs  07/10/16 0530  WBC 12.0*  HGB 9.6*  HCT 30.9*  PLT 181   BMET:  Recent Labs  07/09/16 0553 07/10/16 0530  NA 138 135  K 3.5 3.2*  CL 102 103  CO2 26 24  GLUCOSE 153* 151*  BUN 38* 36*  CREATININE 1.11* 1.03*  CALCIUM 8.3* 8.1*    PT/INR: No results for input(s): LABPROT, INR in the last 72 hours. ABG    Component Value Date/Time   PHART 7.430 06/28/2016 1615   HCO3 20.7 06/28/2016 1615   TCO2 22 06/28/2016 1615   ACIDBASEDEF 3.0 (H) 06/28/2016 1615   O2SAT 93.0 06/28/2016 1615   CBG (last 3)   Recent Labs  07/11/16 0002 07/11/16 0323 07/11/16 0825  GLUCAP 143* 134* 146*   CLINICAL DATA:  61 year old female with type B aortic dissection, left renal infarcts, and bowel ileus.  EXAM: PORTABLE ABDOMEN - 1 VIEW  COMPARISON:  07/10/2016, CT Abdomen and Pelvis 07/07/2016,  and earlier  FINDINGS: KUB view of the abdomen at 0528 hours. NG tube looped in the stomach. Stable cholecystectomy clips. Continued gas-filled prominent large and small bowel loops throughout the abdomen. Mildly increased volume of bowel gas but otherwise the bowel gas pattern has not changed since 07/07/2016 where air-fluid levels were demonstrated by CT in both large and small bowel.  IMPRESSION: 1. Stable bowel gas pattern since 07/07/2016 suggesting small and large bowel ileus. 2. Stable NG tube in the stomach.   Electronically Signed   By: Genevie Ann M.D.   On: 07/11/2016 07:29  Assessment/Plan:  Type B aortic dissection with slowly resolving ileus. She is passing flatus and has had BM's but KUB still shows some persistent gas-filled large and small bowel loops. She is having NG clamped for 4 hr periods to see how she tolerates that and is on clear liquids. Avoid narcotics, continue ambulation.  Chronic anxiety: will resume Celexa and Xanax that she has been on at home for a long time.  Continue TNA for nutrition until taking po well.    LOS: 21 days    Gaye Pollack 07/11/2016

## 2016-07-11 NOTE — Progress Notes (Signed)
[facility] Progress Note    Subjective: Patient has tolerated tube being clamped, taking in some clears, very anxious.  Objective: Vital signs in last 24 hours: Temp:  [97.8 F (36.6 C)-98.8 F (37.1 C)] 98.8 F (37.1 C) (06/09 0324) Pulse Rate:  [89-114] 98 (06/09 0600) Resp:  [15-27] 19 (06/09 0600) BP: (96-152)/(48-68) 112/56 (06/09 0600) SpO2:  [87 %-100 %] 97 % (06/09 0746) Weight:  [80.3 kg (177 lb 0.5 oz)] 80.3 kg (177 lb 0.5 oz) (06/09 0500) Last BM Date: 07/10/16  Intake/Output from previous day: 06/08 0701 - 06/09 0700 In: 2689 [P.O.:50; I.V.:2439; IV Piggyback:200] Out: 2200 [Urine:1850; Emesis/NG output:50; Stool:300] Intake/Output this shift: No intake/output data recorded.  General: Walking in the hallways with PT  Lungs: Clear  Abd: Soft, mildly distended, good bowel sounds.  Extremities: No changes  Neuro: Intact  Lab Results:    Recent Labs  07/09/16 0553 07/10/16 0530  NA 138 135  K 3.5 3.2*  CL 102 103  CO2 26 24  GLUCOSE 153* 151*  BUN 38* 36*  CREATININE 1.11* 1.03*  CALCIUM 8.3* 8.1*   PT/INR No results for input(s): LABPROT, INR in the last 72 hours. ABG No results for input(s): PHART, HCO3 in the last 72 hours.  Invalid input(s): PCO2, PO2  Studies/Results: Dg Abd Portable 1v  Result Date: 07/11/2016 CLINICAL DATA:  61 year old female with type B aortic dissection, left renal infarcts, and bowel ileus. EXAM: PORTABLE ABDOMEN - 1 VIEW COMPARISON:  07/10/2016, CT Abdomen and Pelvis 07/07/2016, and earlier FINDINGS: KUB view of the abdomen at 0528 hours. NG tube looped in the stomach. Stable cholecystectomy clips. Continued gas-filled prominent large and small bowel loops throughout the abdomen. Mildly increased volume of bowel gas but otherwise the bowel gas pattern has not changed since 07/07/2016 where air-fluid levels were demonstrated by CT in both large and small bowel. IMPRESSION: 1. Stable bowel gas pattern since 07/07/2016  suggesting small and large bowel ileus. 2. Stable NG tube in the stomach. Electronically Signed   By: Genevie Ann M.D.   On: 07/11/2016 07:29   Dg Abd Portable 1v  Result Date: 07/10/2016 CLINICAL DATA:  Ileus.  Aortic dissection. EXAM: PORTABLE ABDOMEN - 1 VIEW COMPARISON:  CT scan July 07, 2016.  KUB July 08, 2016 FINDINGS: Persistent small bowel dilatation measuring up to 4.5 cm, mildly more prominent in the interval. The transverse colon is also air-filled but nondilated. The descending colon is relatively decompressed. An NG tube terminates in the stomach. No free air, portal venous gas, or pneumatosis on supine imaging. IMPRESSION: 1. The dilated small bowel loops are mildly more prominent in the interval. Electronically Signed   By: Dorise Bullion III M.D   On: 07/10/2016 07:28    Anti-infectives: Anti-infectives    Start     Dose/Rate Route Frequency Ordered Stop   06/26/16 0830  meropenem (MERREM) 1 g in sodium chloride 0.9 % 100 mL IVPB  Status:  Discontinued     1 g 200 mL/hr over 30 Minutes Intravenous Every 8 hours 06/26/16 0810 07/06/16 0804   06/22/16 1800  clindamycin (CLEOCIN) IVPB 600 mg  Status:  Discontinued     600 mg 100 mL/hr over 30 Minutes Intravenous Every 8 hours 06/22/16 1723 06/26/16 0810   06/22/16 1000  azithromycin (ZITHROMAX) 250 mg in dextrose 5 % 125 mL IVPB  Status:  Discontinued     250 mg 125 mL/hr over 60 Minutes Intravenous Every 24 hours 06/22/16 0826 06/27/16 1019  06/22/16 0900  levofloxacin (LEVAQUIN) IVPB 500 mg  Status:  Discontinued     500 mg 100 mL/hr over 60 Minutes Intravenous Every 24 hours 06/22/16 0759 06/22/16 0824      Assessment/Plan: 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 Continue clears as tolerated OK from surgery standpoint to resume pts home celexa   LOS: 21 days   Clovis Riley, MD 07/11/2016

## 2016-07-11 NOTE — Progress Notes (Signed)
  PHARMACY - ADULT TOTAL PARENTERAL NUTRITION CONSULT NOTE   Pharmacy Consult for TPN Indication: prolonged NPO status/high NG output   Patient Measurements: Height: _0  (160 cm) Weight: 177 lb 0.5 oz (80.3 kg) IBW/kg (Calculated) : 52.4 TPN AdjBW (KG): 59.4 Body mass index is 31.36 kg/m. 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 Patient had a significant hx of N/V for 2-4 weeks prior to admission. Weight appears to have been maintained. Patient had a NG tube placed 5/20 which had significant output with 1-1.5 L a day, removed NG tube on 5/30, however, patient with significant episodes of N&V and NG replaced 6/1 with continuous low suction. NG tube removed but multiple episodes of vomiting with CL trial 6/4 & NG tube replaced again 6/5.    GI: Ileus vs gastroparesis. Repeat CT 6/5- (-)perf/pneumatosis, unresolved SB dilatation. NGT clamped x20 hours but AM KUB 6/8 showing increased ileus with dilated small bowel loops. Placed back to suction and starting 4 hour claming trials.  Phenergan Q 6 prn, ondansetron prn, bisacodyl, simethicone, PPI-IV Endo: Hx of DM. No insulin at home. CBGs 132-197.  No hypoglycemic events.  Insulin requirements in the past 24 hours: 18 units SSI; 80 units of Lantus (split 40 BID), 30 units in tpn (increased 6/6) Lytes:  K down to 3.1 after 4 runs on 6/8, Mg down to 1.7. Lasix 56m IV qday. Phos 4.1 on 6/7-stable. Na low today at 131. Renal:AKI:  Cr 1.16  (Baseline < 1). UOP 1 cc/kg/hr on Lasix, I/O (+)489, Net -8.5L.  Pulm: 2L [place]; Dulera; Brovana, Albuterol  Cards: Stanford type B aortic dissection; BP soft, HR 90s to low 100s in NSR-ST. Metoprolol IV, clonidine 0.3 mg patch , nitroglycerin 0.463mpatch, lasix IV qday, hydralazine IV.  Hepatobil:AST & ALT now improved to wnl.  Alk phos down to 128, Tbili wnl.  Triglycerides 168 stable.  PAlb 9 >> 17.7- marked  improvement Neuro: Fentanyl, Dialudid prn - weaning narcotics to help with ileus.  ID: S/P Meropenem (5/25-6/4) for respiratory failure from PNA. WBC 12 improved (peaked at 36.7).  Afebrile.  All cx negative, CDiff neg.  Best Practices:PPI, mc TPN Access:PICC line placed 5/20 TPN start date:5/22  Nutritional Goals (per RD recommendation on 5/25): KCal: 2000-2200 kcal/day  Protein: 110-120 gm/day   Goal  TPN rate: Clinimix 5/15 E @ 83 ml/hr + lipid emulsion 20 ml/hr   Current Nutrition:   TPN  Plan:  Continue Clinimix E 5/15 at 83 ml/hr Continue lipid emulsion 20% at 20 ml/hr  This provides 100 g of protein and 1894 kCals per day meeting 91% of protein and 95% of kCal needs Continue MVI in TPN Continue TE in TPN every other day, next 6/9 Increase Insulin 35 units in TPN and adjust further as needed Continue Lantus 40 units BID Continue TCTS SSI q4h KCl runs x5 Magnesium 2g IV x1 TPN labs qMon/Thurs Follow-up enteral diet progression and ability to wean TPN   JeSloan LeiterPharmD, BCPS Clinical Pharmacist Clinical Phone 07/11/2016 until 3:30 PM - #[phone] hours, please call #[phone]

## 2016-07-11 NOTE — Progress Notes (Signed)
Patient ID: Haley Graham, female   DOB: 09/21/1955, 61 y.o.   MRN: [id number]  SICU Evening Rounds:  She has been hemodynamically stable today.  Bowels working. Taking some clear liquids. NG still on clamping schedule.  Ambulated  Will culture urine and remove foley.

## 2016-07-12 LAB — GLUCOSE, CAPILLARY
Glucose-Capillary: 177 mg/dL — ABNORMAL HIGH (ref 65–99)
Glucose-Capillary: 172 mg/dL — ABNORMAL HIGH (ref 65–99)
Glucose-Capillary: 137 mg/dL — ABNORMAL HIGH (ref 65–99)
Glucose-Capillary: 181 mg/dL — ABNORMAL HIGH (ref 65–99)
Glucose-Capillary: 157 mg/dL — ABNORMAL HIGH (ref 65–99)
Glucose-Capillary: 152 mg/dL — ABNORMAL HIGH (ref 65–99)

## 2016-07-12 LAB — BASIC METABOLIC PANEL
Anion gap: 5 (ref 5–15)
BUN: 30 mg/dL — ABNORMAL HIGH (ref 6–20)
CO2: 21 mmol/L — ABNORMAL LOW (ref 22–32)
Calcium: 7.6 mg/dL — ABNORMAL LOW (ref 8.9–10.3)
Chloride: 106 mmol/L (ref 101–111)
Creatinine, Ser: 1.07 mg/dL — ABNORMAL HIGH (ref 0.44–1.00)
GFR calc Af Amer: >60 mL/min (ref >60)
GFR calc non Af Amer: 56 mL/min — ABNORMAL LOW (ref >60)
Glucose, Bld: 147 mg/dL — ABNORMAL HIGH (ref 65–99)
Potassium: 3.8 mmol/L (ref 3.5–5.1)
Sodium: 132 mmol/L — ABNORMAL LOW (ref 135–145)

## 2016-07-12 MED ORDER — POTASSIUM CHLORIDE 10 MEQ/50ML IV SOLN
10.0000 meq | INTRAVENOUS | Status: AC
  Administered 2016-07-12 (×2): 10 meq via INTRAVENOUS
  Filled 2016-07-12: qty 50

## 2016-07-12 MED ORDER — M.V.I. ADULT IV INJ
INTRAVENOUS | Status: AC
  Administered 2016-07-12: 18:00:00 via INTRAVENOUS
  Filled 2016-07-12: qty 1992

## 2016-07-12 MED ORDER — FAT EMULSION 20 % IV EMUL
240.0000 mL | INTRAVENOUS | Status: AC
  Administered 2016-07-12: 240 mL via INTRAVENOUS
  Filled 2016-07-12: qty 240

## 2016-07-12 NOTE — Progress Notes (Signed)
   Subjective/Chief Complaint: CC had a few BMs yesterday, tolerated NGT clamping trials   Objective: Vital signs in last 24 hours: Temp:  [97.8 F (36.6 C)-99.2 F (37.3 C)] 97.8 F (36.6 C) (06/10 0410) Pulse Rate:  [95-112] 99 (06/10 0610) Resp:  [17-30] 27 (06/10 0610) BP: (89-141)/(43-66) 126/56 (06/10 0610) SpO2:  [84 %-97 %] 91 % (06/10 0610) Weight:  [80 kg (176 lb 5.9 oz)] 80 kg (176 lb 5.9 oz) (06/10 0500) Last BM Date: 07/11/16  Intake/Output from previous day: 06/09 0701 - 06/10 0700 In: 3085.6 [P.O.:460; I.V.:2375.6; IV Piggyback:250] Out: 2425 [Urine:1675; Emesis/NG output:750] Intake/Output this shift: No intake/output data recorded.  General appearance: cooperative Resp: clear to auscultation bilaterally Cardio: regular rate and rhythm GI: soft, mild dist, not sig tender  Lab Results:   Recent Labs  07/10/16 0530  WBC 12.0*  HGB 9.6*  HCT 30.9*  PLT 181   BMET  Recent Labs  07/10/16 0530 07/11/16 1053  NA 135 131*  K 3.2* 3.1*  CL 103 101  CO2 24 22  GLUCOSE 151* 197*  BUN 36* 31*  CREATININE 1.03* 1.16*  CALCIUM 8.1* 8.1*   PT/INR No results for input(s): LABPROT, INR in the last 72 hours. ABG No results for input(s): PHART, HCO3 in the last 72 hours.  Invalid input(s): PCO2, PO2  Studies/Results: Dg Abd Portable 1v  Result Date: 07/11/2016 CLINICAL DATA:  61 year old female with type B aortic dissection, left renal infarcts, and bowel ileus. EXAM: PORTABLE ABDOMEN - 1 VIEW COMPARISON:  07/10/2016, CT Abdomen and Pelvis 07/07/2016, and earlier FINDINGS: KUB view of the abdomen at 0528 hours. NG tube looped in the stomach. Stable cholecystectomy clips. Continued gas-filled prominent large and small bowel loops throughout the abdomen. Mildly increased volume of bowel gas but otherwise the bowel gas pattern has not changed since 07/07/2016 where air-fluid levels were demonstrated by CT in both large and small bowel. IMPRESSION: 1. Stable  bowel gas pattern since 07/07/2016 suggesting small and large bowel ileus. 2. Stable NG tube in the stomach. Electronically Signed   By: Genevie Ann M.D.   On: 07/11/2016 07:29    Anti-infectives: Anti-infectives    Start     Dose/Rate Route Frequency Ordered Stop   06/26/16 0830  meropenem (MERREM) 1 g in sodium chloride 0.9 % 100 mL IVPB  Status:  Discontinued     1 g 200 mL/hr over 30 Minutes Intravenous Every 8 hours 06/26/16 0810 07/06/16 0804   06/22/16 1800  clindamycin (CLEOCIN) IVPB 600 mg  Status:  Discontinued     600 mg 100 mL/hr over 30 Minutes Intravenous Every 8 hours 06/22/16 1723 06/26/16 0810   06/22/16 1000  azithromycin (ZITHROMAX) 250 mg in dextrose 5 % 125 mL IVPB  Status:  Discontinued     250 mg 125 mL/hr over 60 Minutes Intravenous Every 24 hours 06/22/16 0826 06/27/16 1019   06/22/16 0900  levofloxacin (LEVAQUIN) IVPB 500 mg  Status:  Discontinued     500 mg 100 mL/hr over 60 Minutes Intravenous Every 24 hours 06/22/16 0759 06/22/16 0824     Assessment/Plan: Aortic dissection 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 Continue clears as tolerated   LOS: 22 days   Haley Graham E, MD 07/12/2016  LOS: 22 days    Haley Graham E 07/12/2016

## 2016-07-12 NOTE — Progress Notes (Signed)
  Subjective:  Feels weak and tired this am. Took some liquids yesterday. Had several BM's. No abdominal pain. Some nausea with tube clamped relieved with medication.  Objective: Vital signs in last 24 hours: Temp:  [97.8 F (36.6 C)-99.2 F (37.3 C)] 97.8 F (36.6 C) (06/10 0410) Pulse Rate:  [95-112] 99 (06/10 0610) Cardiac Rhythm: Normal sinus rhythm;Sinus tachycardia (06/10 0400) Resp:  [19-30] 27 (06/10 0610) BP: (89-141)/(43-66) 126/56 (06/10 0610) SpO2:  [84 %-96 %] 91 % (06/10 0610) Weight:  [80 kg (176 lb 5.9 oz)] 80 kg (176 lb 5.9 oz) (06/10 0500)  Hemodynamic parameters for last 24 hours:    Intake/Output from previous day: 06/09 0701 - 06/10 0700 In: 3085.6 [P.O.:460; I.V.:2375.6; IV Piggyback:250] Out: 2425 [Urine:1675; Emesis/NG output:750] Intake/Output this shift: No intake/output data recorded.  General appearance: alert and fatigued Heart: regular rate and rhythm, S1, S2 normal, no murmur, click, rub or gallop Lungs: clear to auscultation bilaterally Abdomen: few bowel sounds, soft, moderately distended, nontender.  Lab Results:  Recent Labs  07/10/16 0530  WBC 12.0*  HGB 9.6*  HCT 30.9*  PLT 181   BMET:  Recent Labs  07/10/16 0530 07/11/16 1053  NA 135 131*  K 3.2* 3.1*  CL 103 101  CO2 24 22  GLUCOSE 151* 197*  BUN 36* 31*  CREATININE 1.03* 1.16*  CALCIUM 8.1* 8.1*    PT/INR: No results for input(s): LABPROT, INR in the last 72 hours. ABG    Component Value Date/Time   PHART 7.430 06/28/2016 1615   HCO3 20.7 06/28/2016 1615   TCO2 22 06/28/2016 1615   ACIDBASEDEF 3.0 (H) 06/28/2016 1615   O2SAT 93.0 06/28/2016 1615   CBG (last 3)   Recent Labs  07/11/16 1950 07/12/16 0048 07/12/16 0408  GLUCAP 126* 177* 172*    Assessment/Plan:  Type B aortic dissection with slowly resolving ileus. She has bowel function but sluggish bowel sounds and some nausea with NG clamped. NG put out 400 cc when unclamped early this am. Allergic  to Reglan. Will continue current plan with intermittent NG clamping, clear liquids as tolerated. Continue TNA.  Continue ambulation, IS PT consult  Foley removed. Urine culture pending.  Repeat KUB and CXR, labs in am.  LOS: 22 days    Gaye Pollack 07/12/2016

## 2016-07-12 NOTE — Progress Notes (Signed)
Ambulated 338ft with [place] walker.  Rest x1 during walk.  ST 125 noted while ambulated.   Spo2 after walk 87%.  O2 applied at [facility].  Spo2 up to 95%.  Will continue to monitor closely.

## 2016-07-12 NOTE — Progress Notes (Signed)
[facility] NOTE   Pharmacy Consult for TPN Indication: prolonged NPO status/high NG output   Patient Measurements: Height: 5' 3" (160 cm) Weight: 176 lb 5.9 oz (80 kg) IBW/kg (Calculated) : 52.4 TPN AdjBW (KG): 59.4 Body mass index is 31.24 kg/m. 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 Patient had a significant hx of N/V for 2-4 weeks prior to admission. Weight appears to have been maintained. Patient had a NG tube placed 5/20 which had significant output with 1-1.5 L a day, removed NG tube on 5/30, however, patient with significant episodes of N&V and NG replaced 6/1 with continuous low suction. NG tube removed but multiple episodes of vomiting with CL trial 6/4 & NG tube replaced again 6/5.    GI: Ileus vs gastroparesis. Repeat CT 6/5- (-)perf/pneumatosis, unresolved SB dilatation. NGT clamped x20 hours but AM KUB 6/8 showing increased ileus with dilated small bowel loops. Placed back to suction and starting 4 hour clamping trials. Tolerating clears and having BMs.  Phenergan Q 6 prn, ondansetron prn, bisacodyl, simethicone, PPI-IV Endo: Hx of DM. No insulin at home. CBGs 126-173.  No hypoglycemic events.  Insulin requirements in the past 24 hours: 18 units SSI (10 units since new TPN bag); 80 units of Lantus (split 40 BID), 35 units in tpn (increased 6/9) Lytes:  K up to 3.8 after 5 runs on 6/9 (goal >4 w/ ileus), Mg 1.7 on 6/9 (goal >2 w/ ileus)- 2g given 6/9. Lasix 3m IV qday. Phos 4.1 on 6/7-stable. Na low today at 132 (up from 6/9). Renal:AKI:  Cr 1.16  (Baseline < 1). UOP 0.9 cc/kg/hr on Lasix, I/O (+)660, Net -6.5L.  Pulm: RA; DDi Kindle Albuterol  Cards: Stanford type B aortic dissection; BP soft, HR 90s to low 100s in NSR-ST. Metoprolol IV, clonidine 0.3 mg patch , nitroglycerin 0.427mpatch, lasix IV qday, hydralazine IV.  Hepatobil:AST & ALT  now improved to wnl.  Alk phos down to 128, Tbili wnl.  Triglycerides 168 stable.  PAlb 9 >> 17.7- marked improvement Neuro: Fentanyl, Dialudid prn - weaning narcotics to help with ileus.  ID: S/P Meropenem (5/25-6/4) for respiratory failure from PNA. WBC 12 improved (peaked at 36.7).  Afebrile.  All cx negative, CDiff neg.  Best Practices:PPI, mc TPN Access:PICC line placed 5/20 TPN start date:5/22  Nutritional Goals (per RD recommendation on 5/25): KCal: 2000-2200 kcal/day  Protein: 110-120 gm/day   Goal  TPN rate: Clinimix 5/15 E @ 83 ml/hr + lipid emulsion 20 ml/hr   Current Nutrition:   TPN Clear liquids  Plan:  Continue Clinimix E 5/15 at 83 ml/hr Continue lipid emulsion 20% at 20 ml/hr  This provides 100 g of protein and 1894 kCals per day meeting 91% of protein and 95% of kCal needs  Continue MVI in TPN Continue TE in TPN every other day, next 6/11 Continue Insulin 35 units in TPN and adjust further as needed Continue Lantus 40 units BID Continue TCTS SSI q4h  KCl runs x2 today for target >4 with ileus and on Lasix scheduled.  Consider addition of po KCl if continuing lasix.  TPN labs qMon/Thurs Follow-up enteral diet progression and ability to wean TPN   JeSloan LeiterPharmD, BCPS Clinical Pharmacist Clinical Phone 07/12/2016 until 3:30 PM - #[phone] hours, please call #[phone]

## 2016-07-13 ENCOUNTER — Inpatient Hospital Stay (HOSPITAL_COMMUNITY): Payer: BLUE CROSS/BLUE SHIELD

## 2016-07-13 DIAGNOSIS — I7102 Dissection of abdominal aorta: Secondary | ICD-10-CM

## 2016-07-13 LAB — COMPREHENSIVE METABOLIC PANEL
ALBUMIN: 1.3 g/dL — AB (ref 3.5–5.0)
ALT: 56 U/L — AB (ref 14–54)
ANION GAP: 7 (ref 5–15)
AST: 41 U/L (ref 15–41)
Alkaline Phosphatase: 108 U/L (ref 38–126)
BILIRUBIN TOTAL: 0.9 mg/dL (ref 0.3–1.2)
BUN: 31 mg/dL — AB (ref 6–20)
CALCIUM: 7.8 mg/dL — AB (ref 8.9–10.3)
CO2: 21 mmol/L — AB (ref 22–32)
CREATININE: 1.1 mg/dL — AB (ref 0.44–1.00)
Chloride: 106 mmol/L (ref 101–111)
GFR calc Af Amer: >60 mL/min (ref >60)
GFR calc non Af Amer: 54 mL/min — ABNORMAL LOW (ref >60)
GLUCOSE: 140 mg/dL — AB (ref 65–99)
Potassium: 3.6 mmol/L (ref 3.5–5.1)
Sodium: 134 mmol/L — ABNORMAL LOW (ref 135–145)
TOTAL PROTEIN: 5.8 g/dL — AB (ref 6.5–8.1)

## 2016-07-13 LAB — DIFFERENTIAL
BASOS PCT: 1 %
Basophils Absolute: 0.1 10*3/uL (ref 0.0–0.1)
EOS PCT: 1 %
Eosinophils Absolute: 0.1 10*3/uL (ref 0.0–0.7)
Lymphocytes Relative: 14 %
Lymphs Abs: 1.4 10*3/uL (ref 0.7–4.0)
MONO ABS: 0.2 10*3/uL (ref 0.1–1.0)
Monocytes Relative: 2 %
Neutro Abs: 8 10*3/uL — ABNORMAL HIGH (ref 1.7–7.7)
Neutrophils Relative %: 82 %
WBC MORPHOLOGY: INCREASED

## 2016-07-13 LAB — GLUCOSE, CAPILLARY
Glucose-Capillary: 114 mg/dL — ABNORMAL HIGH (ref 65–99)
Glucose-Capillary: 136 mg/dL — ABNORMAL HIGH (ref 65–99)
Glucose-Capillary: 138 mg/dL — ABNORMAL HIGH (ref 65–99)
Glucose-Capillary: 138 mg/dL — ABNORMAL HIGH (ref 65–99)
Glucose-Capillary: 140 mg/dL — ABNORMAL HIGH (ref 65–99)
Glucose-Capillary: 151 mg/dL — ABNORMAL HIGH (ref 65–99)
Glucose-Capillary: 162 mg/dL — ABNORMAL HIGH (ref 65–99)

## 2016-07-13 LAB — CBC
HEMATOCRIT: 26.8 % — AB (ref 36.0–46.0)
HEMOGLOBIN: 8.6 g/dL — AB (ref 12.0–15.0)
MCH: 31.7 pg (ref 26.0–34.0)
MCHC: 32.1 g/dL (ref 30.0–36.0)
MCV: 98.9 fL (ref 78.0–100.0)
Platelets: 122 10*3/uL — ABNORMAL LOW (ref 150–400)
RBC: 2.71 MIL/uL — ABNORMAL LOW (ref 3.87–5.11)
RDW: 13.2 % (ref 11.5–15.5)
WBC: 9.8 10*3/uL (ref 4.0–10.5)

## 2016-07-13 LAB — MAGNESIUM: Magnesium: 1.6 mg/dL — ABNORMAL LOW (ref 1.7–2.4)

## 2016-07-13 LAB — PHOSPHORUS: Phosphorus: 3.6 mg/dL (ref 2.5–4.6)

## 2016-07-13 LAB — PREALBUMIN: Prealbumin: 10.4 mg/dL — ABNORMAL LOW (ref 18–38)

## 2016-07-13 LAB — TRIGLYCERIDES: Triglycerides: 132 mg/dL (ref <150)

## 2016-07-13 MED ORDER — TRACE MINERALS CR-CU-MN-SE-ZN 10-1000-500-60 MCG/ML IV SOLN
INTRAVENOUS | Status: AC
  Administered 2016-07-13: 17:00:00 via INTRAVENOUS
  Filled 2016-07-13: qty 1992

## 2016-07-13 MED ORDER — POTASSIUM CHLORIDE 10 MEQ/50ML IV SOLN
10.0000 meq | INTRAVENOUS | Status: AC
  Administered 2016-07-13 (×4): 10 meq via INTRAVENOUS
  Filled 2016-07-13 (×4): qty 50

## 2016-07-13 MED ORDER — MAGNESIUM SULFATE 2 GM/50ML IV SOLN
2.0000 g | Freq: Once | INTRAVENOUS | Status: AC
  Administered 2016-07-13: 2 g via INTRAVENOUS
  Filled 2016-07-13: qty 50

## 2016-07-13 MED ORDER — PHENOL 1.4 % MT LIQD
1.0000 | OROMUCOSAL | Status: DC | PRN
  Administered 2016-07-13: 1 via OROMUCOSAL
  Filled 2016-07-13: qty 177

## 2016-07-13 MED ORDER — ENOXAPARIN SODIUM 30 MG/0.3ML ~~LOC~~ SOLN
30.0000 mg | SUBCUTANEOUS | Status: DC
  Administered 2016-07-13 – 2016-07-19 (×7): 30 mg via SUBCUTANEOUS
  Filled 2016-07-13 (×7): qty 0.3

## 2016-07-13 MED ORDER — CHLORHEXIDINE GLUCONATE CLOTH 2 % EX PADS
6.0000 | MEDICATED_PAD | Freq: Every day | CUTANEOUS | Status: DC
  Administered 2016-07-13 – 2016-08-06 (×25): 6 via TOPICAL

## 2016-07-13 MED ORDER — FENTANYL CITRATE (PF) 100 MCG/2ML IJ SOLN
25.0000 ug | Freq: Four times a day (QID) | INTRAMUSCULAR | Status: DC | PRN
  Administered 2016-07-14: 25 ug via INTRAVENOUS
  Filled 2016-07-13: qty 2

## 2016-07-13 MED ORDER — FAT EMULSION 20 % IV EMUL
240.0000 mL | INTRAVENOUS | Status: AC
  Administered 2016-07-13: 240 mL via INTRAVENOUS
  Filled 2016-07-13: qty 240

## 2016-07-13 NOTE — Progress Notes (Signed)
NG tube removed per order, pt education given and PRN chloraseptic spray administered. Will continue to monitor pt closely.

## 2016-07-13 NOTE — Progress Notes (Signed)
      [address]       [place] [address]             [phone]      No new issues  Tolerating liquids  BP (!) 128/104   Pulse (!) 112   Temp 98.5 F (36.9 C) (Oral)   Resp 19   Ht 5\' 3"  (1.6 m)   Wt 176 lb 5.9 oz (80 kg)   SpO2 94%   BMI 31.24 kg/m   Remo Lipps C. [name], MD [facility] [phone]

## 2016-07-13 NOTE — Progress Notes (Signed)
Physical Therapy Treatment Patient Details Name: Haley Graham MRN: [id number] DOB: Jun 18, 1955 Today's Date: 07/13/2016    History of Present Illness Pt adm with type B aortic dissection. Treated non surgically with BP control and pain control. Pt developed pancreatitis and ileus with NG tube placed. PMH - anxiety, arthritis    PT Comments    Pt is making steady progress towards her goals. Pt currently, minA for bed mobility, and transfers to RW and ambulation of 135 feet with RW. Pt requires close chair follow as she did need to take a seated rest break with ambulation today. Pt educated on need for deep, slowed breathing through her nose for decreased pain with movement. Pt requires skilled PT to progress gait training and to improve LE strength and endurance to safely mobilize in her discharge environment.    Follow Up Recommendations  Home health PT;Supervision/Assistance - 24 hour     Equipment Recommendations  Rolling walker with 5" wheels    Recommendations for Other Services       Precautions / Restrictions Precautions Precautions: Fall Restrictions Weight Bearing Restrictions: No    Mobility  Bed Mobility Overal bed mobility: Needs Assistance Bed Mobility: Supine to Sit     Supine to sit: Min assist;HOB elevated     General bed mobility comments: increased time to upright vc for scooting to EoB  Transfers Overall transfer level: Needs assistance Equipment used: Rolling walker (2 wheeled) Transfers: Sit to/from Stand Sit to Stand: Min assist;+2 safety/equipment;Min guard         General transfer comment: initial sit>stand from bed required minAx2 but subsequent sit<>stand from toilet and recliner only required min guard   Ambulation/Gait Ambulation/Gait assistance: Min guard;+2 safety/equipment;Min assist (chair follow) Ambulation Distance (Feet): 135 Feet (1x50, 1x85) Assistive device: Rolling walker (2 wheeled) Gait Pattern/deviations: Step-through  pattern;Decreased step length - right;Decreased step length - left Gait velocity: decr Gait velocity interpretation: Below normal speed for age/gender General Gait Details: Assist for balance and support.  1xLoB requiring minAx1 for steadying, Brief sitting rest break between bouts. HR did incr to 139 bpm with activity, SpO2 remained above 90%O2 on 2 L via nasal cannula, RR increased to 26 breaths/minute with activity, vc for deeper breathing in through nose         Balance Overall balance assessment: Needs assistance         Standing balance support: Bilateral upper extremity supported Standing balance-Leahy Scale: Poor Standing balance comment: walker and min guard assist for static standing                            Cognition Arousal/Alertness: Awake/alert Behavior During Therapy: Flat affect Overall Cognitive Status: Impaired/Different from baseline Area of Impairment: Following commands;Attention;Problem solving                   Current Attention Level: Sustained   Following Commands: Follows one step commands with increased time Safety/Judgement: Decreased awareness of safety Awareness: Emergent Problem Solving: Slow processing;Decreased initiation;Requires verbal cues;Requires tactile cues General Comments: limited communication throughout session, especially with activity         General Comments General comments (skin integrity, edema, etc.): Pt commented on how weak she felt and PT educated on need to move to start building her strength back and that it would take some time to accomplish      Pertinent Vitals/Pain Pain Assessment: 0-10 Faces Pain Scale: Hurts even more Pain Location: abdomen Pain  Descriptors / Indicators: Discomfort;Grimacing Pain Intervention(s): Monitored during session;Limited activity within patient's tolerance;Other (comment) (requested RN order k-pad for back pain while sitting in chai)           PT Goals (current  goals can now be found in the care plan section) Acute Rehab PT Goals PT Goal Formulation: With patient/family Time For Goal Achievement: 07/10/16 Potential to Achieve Goals: Good Progress towards PT goals: Progressing toward goals    Frequency    Min 3X/week      PT Plan Current plan remains appropriate       AM-PAC PT "6 Clicks" Daily Activity  Outcome Measure  Difficulty turning over in bed (including adjusting bedclothes, sheets and blankets)?: A Little Difficulty moving from lying on back to sitting on the side of the bed? : A Lot Difficulty sitting down on and standing up from a chair with arms (e.g., wheelchair, bedside commode, etc,.)?: A Little Help needed moving to and from a bed to chair (including a wheelchair)?: A Little Help needed walking in hospital room?: A Little Help needed climbing 3-5 steps with a railing? : A Lot 6 Click Score: 16    End of Session Equipment Utilized During Treatment: Gait belt Activity Tolerance: Patient limited by fatigue (limited by nausea) Patient left: in chair;with call bell/phone within reach;with family/visitor present Nurse Communication: Mobility status PT Visit Diagnosis: Muscle weakness (generalized) (M62.81);Other abnormalities of gait and mobility (R26.89)     Time: 4536-4680 PT Time Calculation (min) (ACUTE ONLY): 37 min  Charges:  $Gait Training: 8-22 mins $Therapeutic Activity: 8-22 mins                    G Codes:       Sophie Tamez B. Migdalia Dk PT, DPT Acute Rehabilitation  [phone] Pager [phone]     [place] 07/13/2016, 11:32 AM

## 2016-07-13 NOTE — Progress Notes (Signed)
  PHARMACY - ADULT TOTAL PARENTERAL NUTRITION CONSULT NOTE   Pharmacy Consult for TPN Indication: prolonged NPO status/high NG output   Patient Measurements: Height: _0  (160 cm) Weight: 176 lb 5.9 oz (80 kg) IBW/kg (Calculated) : 52.4 TPN AdjBW (KG): 59.4 Body mass index is 31.24 kg/m. 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 Patient had a significant hx of N/V for 2-4 weeks prior to admission. Weight appears to have been maintained. Patient had a NG tube placed 5/20 which had significant output with 1-1.5 L a day, removed NG tube on 5/30, however, patient with significant episodes of N&V and NG replaced 6/1 with continuous low suction. NG tube removed but multiple episodes of vomiting with CL trial 6/4 & NG tube replaced again 6/5. Attempting longer clamping trial in hopes NGT can be removed on 6/11 afternoon   GI: Ileus vs gastroparesis. Repeat CT 6/5- (-)perf/pneumatosis, unresolved SB dilatation. NGT clamped, but patient still having nausea. Plan per surgery is for longer clamping trial and possible removal of NGT this afternoon.  Tolerating clears and having BMs.  Phenergan q6h prn (last received 6/4), ondansetron q6h prn (received this morning), bisacodyl, simethicone, PPI-IV Endo: Hx of DM. No insulin at home. CBGs 140-181.  Insulin requirements in the past 24 hours: 14 units SSI, 80 units of Lantus (split 40 BID), 35 units in TPN (since 6/9) Lytes:  K 3.6 (goal >4 w/ ileus), Mg 1.6 (goal >2 w/ ileus), Phos 3.6, Na 134- low but increasing. Lasix 57m IV qday. Renal: AKI:  Cr 1.16  (Baseline < 1). UOP 0.9 cc/kg/hr on Lasix, I/O (+)660, Net -6.5L.  Pulm: RA; DDi Kindle Albuterol  Cards: Stanford type B aortic dissection; BP soft, HR 90s to low 100s in NSR-ST. Metoprolol IV, clonidine 0.3 mg patch , nitroglycerin 0.427mpatch, lasix IV qday, hydralazine IV.  Hepatobil:AST & ALT now improved  to wnl.  Alk phos down to 128, Tbili wnl.  Triglycerides 168 stable.  PAlb 9 >> 17.7- marked improvement Neuro: Fentanyl, Dialudid prn - weaning narcotics to help with ileus.  ID: S/P Meropenem (5/25-6/4) for respiratory failure from PNA. WBC 12 improved (peaked at 36.7).  Afebrile.  All cx negative, CDiff neg.  Best Practices:PPI, mc TPN Access:PICC line placed 5/20 TPN start date:5/22  Nutritional Goals (per RD recommendation on 5/25): KCal: 2000-2200 kcal/day  Protein: 110-120 gm/day   Goal  TPN rate: Clinimix 5/15 E @ 83 ml/hr + lipid emulsion 20 ml/hr   Current Nutrition:   TPN- Clinimix E 5/15 at 83 ml/hr + lipid emulsion 20% at 20 ml/hr Clear liquids- fair/poor intake  Plan:  -KCl 1040mruns x4 and magnesium sulfate 2g IV x1 for replacement -Continue Clinimix E 5/15 at 83 ml/hr + lipid emulsion 20% at 20 ml/hr - provides 100g of protein and 1894 kCals per day meeting 91% of protein and 95% of kCal needs -Continue daily MVI in TPN + trace elements every other day, next 6/11 -Continue Insulin 35 units in TPN -Continue Lantus 40 units BID + TCTS SSI q4h -BMET, magnesium and phosphorous to be checked in the morning  -Follow-up enteral diet progression and ability to wean TPN   Haley Graham D. Haley Graham, PharmD, BCPS Clinical Pharmacist Pager: [phone] Phone for 07/13/2016 until 3:30pm: [phone] after 3:30pm, please call main pharmacy at [phone] 07/13/2016 9:02 AM

## 2016-07-13 NOTE — Progress Notes (Signed)
Subjective/Chief Complaint: Had nausea yesterday with NGT clamped.  Has not received as much nausea medication.  Denies pain.     Objective: Vital signs in last 24 hours: Temp:  [97.8 F (36.6 C)-99.4 F (37.4 C)] 99.4 F (37.4 C) (06/11 0400) Pulse Rate:  [92-114] 99 (06/11 0830) Resp:  [18-28] 23 (06/11 0830) BP: (91-140)/(40-78) 127/52 (06/11 0847) SpO2:  [91 %-100 %] 97 % (06/11 0830) Last BM Date: 07/12/16  Intake/Output from previous day: 06/10 0701 - 06/11 0700 In: 2640 [P.O.:300; I.V.:2160; NG/GT:180] Out: 401 [Urine:400; Stool:1] Intake/Output this shift: No intake/output data recorded.  General appearance: cooperative Resp: breathing comfortably Cardio: regular rate and rhythm GI: soft, mild dist, not sig tender  Lab Results:   Recent Labs  07/13/16 0500  WBC 9.8  HGB 8.6*  HCT 26.8*  PLT 122*   BMET  Recent Labs  07/12/16 0814 07/13/16 0500  NA 132* 134*  K 3.8 3.6  CL 106 106  CO2 21* 21*  GLUCOSE 147* 140*  BUN 30* 31*  CREATININE 1.07* 1.10*  CALCIUM 7.6* 7.8*   PT/INR No results for input(s): LABPROT, INR in the last 72 hours. ABG No results for input(s): PHART, HCO3 in the last 72 hours.  Invalid input(s): PCO2, PO2  Studies/Results: Dg Chest Port 1 View  Result Date: 07/13/2016 CLINICAL DATA:  Follow-up examination for aortic dissection. EXAM: PORTABLE CHEST 1 VIEW COMPARISON:  Prior radiograph from 07/08/2016. FINDINGS: Enteric tube courses in the the abdomen. Right-sided PICC catheter remains in place with tip overlying the cavoatrial junction. Stable cardiomegaly. Mediastinal silhouette unchanged and within normal limits. Lungs hypoinflated. Persistent patchy and linear left basilar opacity, which may reflect atelectasis or infiltrate. Previously seen left pleural effusion has largely resolved. No new focal airspace opacity. No pulmonary edema. No pneumothorax. No acute osseus abnormality. IMPRESSION: 1. Interval decrease with  near resolution of left pleural effusion. Persistent patchy left basilar opacity also improved, and may reflect residual atelectasis and/or infiltrate. 2. No other new cardiopulmonary finding. Electronically Signed   By: Jeannine Boga M.D.   On: 07/13/2016 06:42   Dg Abd Portable 1v  Result Date: 07/13/2016 CLINICAL DATA:  Initial evaluation for aortic dissection, abdominal distension. EXAM: PORTABLE ABDOMEN - 1 VIEW COMPARISON:  Prior radiograph from 07/11/2016. FINDINGS: Enteric to is scored within the stomach. Cholecystectomy clips noted. There are persistent scattered gas-filled loops of bowel within the abdomen. Overall volume gas is slightly decreased from prior, although the bowel gas pattern has not significantly changed. IMPRESSION: 1. Persistent gas-filled loops of small and large bowel within the abdomen, suggesting small and large bowel ileus. Overall, degree of gaseous distention is slightly improved from previous. 2. NG tube coiled within the stomach. Electronically Signed   By: Jeannine Boga M.D.   On: 07/13/2016 06:39    Anti-infectives: Anti-infectives    Start     Dose/Rate Route Frequency Ordered Stop   06/26/16 0830  meropenem (MERREM) 1 g in sodium chloride 0.9 % 100 mL IVPB  Status:  Discontinued     1 g 200 mL/hr over 30 Minutes Intravenous Every 8 hours 06/26/16 0810 07/06/16 0804   06/22/16 1800  clindamycin (CLEOCIN) IVPB 600 mg  Status:  Discontinued     600 mg 100 mL/hr over 30 Minutes Intravenous Every 8 hours 06/22/16 1723 06/26/16 0810   06/22/16 1000  azithromycin (ZITHROMAX) 250 mg in dextrose 5 % 125 mL IVPB  Status:  Discontinued     250 mg 125 mL/hr  over 60 Minutes Intravenous Every 24 hours 06/22/16 0826 06/27/16 1019   06/22/16 0900  levofloxacin (LEVAQUIN) IVPB 500 mg  Status:  Discontinued     500 mg 100 mL/hr over 60 Minutes Intravenous Every 24 hours 06/22/16 0759 06/22/16 0824     Assessment/Plan: Aortic dissection Suspected  intestinal angina Try longer NGt clamping trial.  If no n/v over the day, get NGT out this afternoon.     LOS: 23 days   Karson Reede, MD 07/13/2016  LOS: 23 days    Shafin Pollio 07/13/2016

## 2016-07-13 NOTE — Progress Notes (Signed)
  Subjective: Little change- on liq diet with BM x 1 today, walked in hall x2 No emesis KUB w/ mild s\mall bowel diatation  Objective: Vital signs in last 24 hours: Temp:  [97.6 F (36.4 C)-99.4 F (37.4 C)] 98.7 F (37.1 C) (06/11 1550) Pulse Rate:  [84-117] 110 (06/11 1500) Cardiac Rhythm: Sinus tachycardia (06/11 1500) Resp:  [19-28] 25 (06/11 1500) BP: (88-140)/(44-78) 110/44 (06/11 1500) SpO2:  [93 %-100 %] 96 % (06/11 1500)  Hemodynamic parameters for last 24 hours:    Intake/Output from previous day: 06/10 0701 - 06/11 0700 In: 2640 [P.O.:300; I.V.:2160; NG/GT:180] Out: 401 [Urine:400; Stool:1] Intake/Output this shift: Total I/O In: 710 [P.O.:25; I.V.:465; NG/GT:20; IV Piggyback:200] Out: 500 [Urine:500]  abd nontender extrem warm with pulses  Lab Results:  Recent Labs  07/13/16 0500  WBC 9.8  HGB 8.6*  HCT 26.8*  PLT 122*   BMET:  Recent Labs  07/12/16 0814 07/13/16 0500  NA 132* 134*  K 3.8 3.6  CL 106 106  CO2 21* 21*  GLUCOSE 147* 140*  BUN 30* 31*  CREATININE 1.07* 1.10*  CALCIUM 7.6* 7.8*    PT/INR: No results for input(s): LABPROT, INR in the last 72 hours. ABG    Component Value Date/Time   PHART 7.430 06/28/2016 1615   HCO3 20.7 06/28/2016 1615   TCO2 22 06/28/2016 1615   ACIDBASEDEF 3.0 (H) 06/28/2016 1615   O2SAT 93.0 06/28/2016 1615   CBG (last 3)   Recent Labs  07/13/16 0457 07/13/16 0742 07/13/16 1128  GLUCAP 114* 162* 138*    Assessment/Plan: S/P Type B dissection, intestina ischemia Mobilize Diabetes control NG tube management per C[facility]   LOS: 23 days    Haley Graham 07/13/2016

## 2016-07-13 NOTE — Progress Notes (Signed)
Subjective: Interval History: none.Marland Kitchen Resting quietly. Tolerating NG clamped  Objective: Vital signs in last 24 hours: Temp:  [97.6 F (36.4 C)-99.4 F (37.4 C)] 97.6 F (36.4 C) (06/11 1128) Pulse Rate:  [84-117] 84 (06/11 1200) Resp:  [19-28] 22 (06/11 1200) BP: (94-140)/(42-78) 116/57 (06/11 1200) SpO2:  [93 %-100 %] 98 % (06/11 1200)  Intake/Output from previous day: 06/10 0701 - 06/11 0700 In: 2640 [P.O.:300; I.V.:2160; NG/GT:180] Out: 401 [Urine:400; Stool:1] Intake/Output this shift: Total I/O In: 710 [P.O.:25; I.V.:465; NG/GT:20; IV Piggyback:200] Out: 500 [Urine:500]  No change in physical exam  Lab Results:  Recent Labs  07/13/16 0500  WBC 9.8  HGB 8.6*  HCT 26.8*  PLT 122*   BMET  Recent Labs  07/12/16 0814 07/13/16 0500  NA 132* 134*  K 3.8 3.6  CL 106 106  CO2 21* 21*  GLUCOSE 147* 140*  BUN 30* 31*  CREATININE 1.07* 1.10*  CALCIUM 7.6* 7.8*    Studies/Results: Ct Abdomen Pelvis Wo Contrast  Result Date: 06/26/2016 CLINICAL DATA:  Shortness of breath EXAM: CT CHEST, ABDOMEN AND PELVIS WITHOUT CONTRAST TECHNIQUE: Multidetector CT imaging of the chest, abdomen and pelvis was performed following the standard protocol without IV contrast. COMPARISON:  06/20/2016 chest CT FINDINGS: CT CHEST FINDINGS Cardiovascular: Normal heart size. No pericardial effusion. 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 Mild haziness around the upper descending segment is likely stable. Stable maximal diameter of 33 mm at this level. No intramural hematoma. Intermittently seen displaced intimal calcification. Mediastinum/Nodes: Negative for adenopathy. Right upper extremity PICC with tip at the SVC level. Lungs/Pleura: Multi segment atelectasis, worse on the left. Small pleural effusions. There is patchy ground-glass airspace density in the bilateral lungs without gradient. Airways are clear and there is no septal thickening. Musculoskeletal: No acute or  aggressive finding. CT ABDOMEN PELVIS FINDINGS Hepatobiliary: Hepatic steatosis.Cholecystectomy with normal common bile duct diameter. Pancreas: Unremarkable. Spleen: Unremarkable. 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 The larger measures up to 27 mm and is indeterminate by densitometry, but left adrenal mass has been noted since at least 2006 abdominal CT report, images not available. Left renal cyst. No hydronephrosis or urolithiasis. 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 Negative decompressed urinary bladder. Stomach/Bowel: There is diffuse small bowel distention and fluid filling with mildly hazy mesenteries. Similar features in the colon proximally and at the transverse segment. No pneumatosis or perforation is noted. Nasogastric tube tip is at the pylorus. Vascular/Lymphatic: There is intimal flap displacement from known dissection, morphology appearing similar to prior. No mass or adenopathy. Reproductive:Hysterectomy.  Unremarkable ovaries. Other: No ascites or pneumoperitoneum.  Anasarca. Musculoskeletal: No acute abnormalities. These results were called by telephone at the time of interpretation on 06/26/2016 at 2:05 pm to Dr. Ivin Poot , who verbally acknowledged these results. IMPRESSION: 1. Diffuse airspace disease. Pattern can be seen with noncardiogenic edema (ARDS), inflammatory pneumonitis, or atypical infection. Multi segment atelectasis at the bases and small pleural effusions. 2. Diffuse small bowel and colonic distention with fluid levels as seen with ileus. Question underlying bowel ischemia given the patient's known aortic dissection with proximal IMA occlusion and SMA flow via the narrow true lumen. 3. Incidental findings noted above. Electronically Signed   By: Monte Fantasia M.D.   On: 06/26/2016 14:05   Ct Chest Wo Contrast  Result Date: 06/26/2016 CLINICAL DATA:  Shortness  of breath EXAM:  CT CHEST, ABDOMEN AND PELVIS WITHOUT CONTRAST TECHNIQUE: Multidetector CT imaging of the chest, abdomen and pelvis was performed following the standard protocol without IV contrast. COMPARISON:  06/20/2016 chest CT FINDINGS: CT CHEST FINDINGS Cardiovascular: Normal heart size. No pericardial effusion. 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 Mild haziness around the upper descending segment is likely stable. Stable maximal diameter of 33 mm at this level. No intramural hematoma. Intermittently seen displaced intimal calcification. Mediastinum/Nodes: Negative for adenopathy. Right upper extremity PICC with tip at the SVC level. Lungs/Pleura: Multi segment atelectasis, worse on the left. Small pleural effusions. There is patchy ground-glass airspace density in the bilateral lungs without gradient. Airways are clear and there is no septal thickening. Musculoskeletal: No acute or aggressive finding. CT ABDOMEN PELVIS FINDINGS Hepatobiliary: Hepatic steatosis.Cholecystectomy with normal common bile duct diameter. Pancreas: Unremarkable. Spleen: Unremarkable. 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 The larger measures up to 27 mm and is indeterminate by densitometry, but left adrenal mass has been noted since at least 2006 abdominal CT report, images not available. Left renal cyst. No hydronephrosis or urolithiasis. 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 Negative decompressed urinary bladder. Stomach/Bowel: There is diffuse small bowel distention and fluid filling with mildly hazy mesenteries. Similar features in the colon proximally and at the transverse segment. No pneumatosis or perforation is noted. Nasogastric tube tip is at the pylorus. Vascular/Lymphatic: There is intimal flap displacement from known dissection, morphology appearing similar to prior. No mass or adenopathy.  Reproductive:Hysterectomy.  Unremarkable ovaries. Other: No ascites or pneumoperitoneum.  Anasarca. Musculoskeletal: No acute abnormalities. These results were called by telephone at the time of interpretation on 06/26/2016 at 2:05 pm to Dr. Ivin Poot , who verbally acknowledged these results. IMPRESSION: 1. Diffuse airspace disease. Pattern can be seen with noncardiogenic edema (ARDS), inflammatory pneumonitis, or atypical infection. Multi segment atelectasis at the bases and small pleural effusions. 2. Diffuse small bowel and colonic distention with fluid levels as seen with ileus. Question underlying bowel ischemia given the patient's known aortic dissection with proximal IMA occlusion and SMA flow via the narrow true lumen. 3. Incidental findings noted above. Electronically Signed   By: Monte Fantasia M.D.   On: 06/26/2016 14:05   Ct Abdomen Pelvis W Contrast  Result Date: 07/07/2016 CLINICAL DATA:  61 year old female with abdominal pain and distension greater on the right. Subsequent encounter. EXAM: CT ABDOMEN AND PELVIS WITH CONTRAST TECHNIQUE: Multidetector CT imaging of the abdomen and pelvis was performed using the standard protocol following bolus administration of intravenous contrast. CONTRAST:  75 cc Isovue 300. COMPARISON:  07/06/2016 plain film exam. 06/27/2016 CT angiogram chest, abdomen and pelvis. 10/27/2011 FINDINGS: Lower chest: Basilar atelectasis greatest medial left lung base. Heart top-normal size with aortic/mitral valve calcification. Hepatobiliary: Mild fatty infiltration of the liver without worrisome hepatic lesion. Post cholecystectomy. Pancreas: No mass or inflammation.  No duct dilation. Spleen: No mass or enlargement. Adrenals/Urinary Tract: Prominent infarct left kidney. Left renal cyst. Nodularity adrenal glands greater on the left has changed slightly since 2013 with largest left adrenal nodule currently measuring 2.6 x 2.5 x 1.9 cm versus on 2013 exam 2.4 x 2.3 x 1.9 cm  Stomach/Bowel: Fluid and gas-filled prominent size small bowel without point of transition noted. Fluid-filled featureless colon. Minimal amount of fluid adjacent to small bowel loops and minimal hazy infiltration of fat planes surrounding the descending colon. No  pneumatosis or free intraperitoneal air. Findings may reflect changes of enterocolitis of indeterminate etiology whether ischemic, inflammatory or infectious. No pneumatosis or free intraperitoneal air. 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 Dissection extends into iliac arteries. Inferior mesenteric artery may fill in a retrograde fashion. Appearance unchanged from recent CT angiogram. Scattered normal size lymph nodes. Reproductive: No worrisome abnormality. Foley catheter with decompressed urinary bladder. Other: No bowel containing hernia. Musculoskeletal: No acute osseous abnormality. IMPRESSION: Fluid and gas-filled prominent size small bowel without worrisome point of transition. Fluid-filled featureless colon. Minimal amount of fluid adjacent to small bowel loops and minimal hazy infiltration of fat planes surrounding the descending colon. No pneumatosis or free intraperitoneal air. Findings may reflect changes of enterocolitis of indeterminate etiology whether ischemic, inflammatory or infectious. No pneumatosis or free intraperitoneal air. 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 Infarction majority of the left kidney. Adrenal gland nodularity greater on left stable since recent exams and minimally changed since 2013 as detailed above. Electronically Signed   By: Genia Del M.D.   On: 07/07/2016 14:22   Dg Chest Port 1 View  Result Date: 07/13/2016 CLINICAL DATA:  Follow-up examination for aortic dissection. EXAM: PORTABLE CHEST 1 VIEW COMPARISON:  Prior  radiograph from 07/08/2016. FINDINGS: Enteric tube courses in the the abdomen. Right-sided PICC catheter remains in place with tip overlying the cavoatrial junction. Stable cardiomegaly. Mediastinal silhouette unchanged and within normal limits. Lungs hypoinflated. Persistent patchy and linear left basilar opacity, which may reflect atelectasis or infiltrate. Previously seen left pleural effusion has largely resolved. No new focal airspace opacity. No pulmonary edema. No pneumothorax. No acute osseus abnormality. IMPRESSION: 1. Interval decrease with near resolution of left pleural effusion. Persistent patchy left basilar opacity also improved, and may reflect residual atelectasis and/or infiltrate. 2. No other new cardiopulmonary finding. Electronically Signed   By: Jeannine Boga M.D.   On: 07/13/2016 06:42   Dg Chest Port 1 View  Result Date: 07/08/2016 CLINICAL DATA:  Shortness of Breath EXAM: PORTABLE CHEST 1 VIEW COMPARISON:  July 04, 2016 FINDINGS: Central catheter tip is in the superior vena cava, stable. Nasogastric tube tip and side port are in the stomach. No pneumothorax. There is persistent airspace consolidation in the left lower lobe with small left pleural effusion. Right lung is clear. Heart is upper normal in size with pulmonary vascularity within normal limits. No adenopathy. No evident bone lesions. IMPRESSION: Tube and catheter positions as described without pneumothorax. Persistent left lower lobe consolidation with small left pleural effusion. No new parenchymal lung opacity. Stable cardiac silhouette. Electronically Signed   By: Lowella Grip III M.D.   On: 07/08/2016 08:00   Dg Chest Port 1 View  Result Date: 07/04/2016 CLINICAL DATA:  Followup shortness of breath.  Aortic dissection. EXAM: PORTABLE CHEST 1 VIEW COMPARISON:  07/01/2016 FINDINGS: Nasogastric tube enters the stomach. Right arm PICC tip is in the SVC above right atrium. Less pulmonary edema. Persistent left  effusion. Persistent volume loss in both lower lobes left worse than right. Chronic prominence of the aortic shadow consistent with the history of dissection. IMPRESSION: Less pulmonary edema. Persistent left effusion. Persistent atelectasis in both lower lobes left more than right. Persistent prominent aortic/mediastinal shadow. Electronically Signed   By: Nelson Chimes M.D.   On: 07/04/2016 08:11   Dg Chest [facility] 1 View  Result  Date: 07/01/2016 CLINICAL DATA:  Shortness of breath common pneumonia, thoracic aortic dissection EXAM: PORTABLE CHEST 1 VIEW COMPARISON:  Portable chest x-ray of Jun 30, 2016 FINDINGS: The lungs are adequately inflated. The pulmonary interstitial markings are less prominent. The retrocardiac region remains dense and there is remains partial obscuration of the left hemidiaphragm. The heart is top-normal in size. The mediastinum is normal in width. The esophagogastric tube tip projects below the inferior margin of the image. The right PICC line tip projects over the midportion of the SVC. IMPRESSION: Slight interval improvement in the appearance of the pulmonary interstitium suggests decreasing interstitial edema. There is persistent left lower lobe atelectasis or pneumonia. Electronically Signed   By: David  Martinique M.D.   On: 07/01/2016 07:35   Dg Chest Port 1 View  Result Date: 06/30/2016 CLINICAL DATA:  Breath, pneumonia, aortic aneurysm, GERD, hypertension, diabetes mellitus EXAM: PORTABLE CHEST 1 VIEW COMPARISON:  Portable exam 0633 hours compared to 06/29/2016 FINDINGS: Nasogastric tube coiled in proximal stomach. RIGHT arm PICC line tip projects over SVC above cavoatrial junction. Upper normal heart size. Mediastinal contours normal. Mild pulmonary vascular congestion. Persistent diffuse BILATERAL infiltrates. Atelectasis versus consolidation LEFT lower lobe. No pneumothorax. IMPRESSION: Persistent mild diffuse interstitial infiltrates with atelectasis versus consolidation in  LEFT lower lobe. Electronically Signed   By: Lavonia Dana M.D.   On: 06/30/2016 06:58   Dg Chest Port 1 View  Result Date: 06/29/2016 CLINICAL DATA:  Shortness of Breath EXAM: PORTABLE CHEST 1 VIEW COMPARISON:  06/28/2016 FINDINGS: Nasogastric catheter is noted coiled within the stomach. Right-sided PICC line is seen in the distal superior vena cava. Cardiac shadow is mildly enlarged but stable. Bilateral vascular congestion and edema is seen stable from prior exam. Some atelectasis remains in the left retrocardiac region. IMPRESSION: No significant interval change from the prior exam. Electronically Signed   By: Inez Catalina M.D.   On: 06/29/2016 07:36   Dg Chest Port 1 View  Result Date: 06/28/2016 CLINICAL DATA:  Aortic dissection. EXAM: PORTABLE CHEST 1 VIEW COMPARISON:  06/27/2016. FINDINGS: The heart is enlarged. There is moderate vascular congestion. Prominence of the aortic contour reflecting the type B dissection. LEFT lower lobe atelectasis with effusion. IMPRESSION: Cardiomegaly. Aortic dissection. Vascular congestion. LEFT lower lobe atelectasis with effusion. Electronically Signed   By: Staci Righter M.D.   On: 06/28/2016 07:13   Dg Chest Port 1 View  Result Date: 06/27/2016 CLINICAL DATA:  ARDS. EXAM: PORTABLE CHEST 1 VIEW COMPARISON:  Jun 26, 2016 FINDINGS: The right PICC line is stable. The NG tube terminates below today's study. Diffuse bilateral primarily interstitial opacities are mildly more homogeneous in less patchy in appearance. More focal opacity in the left base is stable. No other changes. IMPRESSION: 1. Diffuse bilateral primarily interstitial opacities suggesting edema remain. More focal opacity in the left base is stable to mildly improved. Electronically Signed   By: Dorise Bullion III M.D   On: 06/27/2016 07:21   Dg Chest Port 1 View  Result Date: 06/26/2016 CLINICAL DATA:  Shortness of breath. EXAM: PORTABLE CHEST 1 VIEW COMPARISON:  Radiograph of Jun 25, 2016.  FINDINGS: Stable cardiomegaly. Increased bilateral diffuse interstitial and basilar opacities are noted concerning for worsening edema. No pneumothorax is noted. Mild left pleural effusion is noted with associated atelectasis or infiltrate. Right-sided PICC line is unchanged in position. Nasogastric tube is unchanged in position. Bony thorax is unremarkable. IMPRESSION: Increased bilateral diffuse interstitial densities consistent with edema. Mild left pleural effusion is noted  with associated atelectasis or infiltrate. Electronically Signed   By: Marijo Conception, M.D.   On: 06/26/2016 07:40   Dg Chest Port 1 View  Result Date: 06/25/2016 CLINICAL DATA:  Shortness of Breath EXAM: PORTABLE CHEST 1 VIEW COMPARISON:  06/24/2016 FINDINGS: Cardiac shadow is prominent but stable. Right-sided PICC line and nasogastric catheter are again seen and stable. Bibasilar atelectatic changes are noted worse on the left than the right and increased from the prior exam. Small left pleural effusion is noted as well. Mild vascular congestion is seen. IMPRESSION: Increasing bibasilar infiltrates left greater than right. Mild vascular congestion is noted. The Electronically Signed   By: Inez Catalina M.D.   On: 06/25/2016 07:54   Dg Chest Port 1 View  Result Date: 06/24/2016 CLINICAL DATA:  Shortness of Breath EXAM: PORTABLE CHEST 1 VIEW COMPARISON:  06/23/2016 FINDINGS: Right-sided PICC line is again noted at the cavoatrial junction. Nasogastric catheter is noted in satisfactory position through the overall inspiratory effort is decreased from the prior exam. Mild left basilar atelectasis remains. No other focal abnormality is noted. IMPRESSION: Stable left basilar atelectasis. Electronically Signed   By: Inez Catalina M.D.   On: 06/24/2016 08:07   Dg Chest Port 1 View  Result Date: 06/23/2016 CLINICAL DATA:  61 year old female with shortness of breath and abdominal pain. 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 True lumen occlusion below the SMA. Being treated conservatively at this time, including with bowel rest. EXAM: PORTABLE CHEST 1 VIEW COMPARISON:  Chest radiographs 06/22/2016 and earlier. CTA 06/20/2016 FINDINGS: Portable AP semi upright view at 0754 hours. Stable enteric tube which courses to the abdomen. Stable right PICC line. Mildly larger lung volumes and decreased streaky bibasilar opacity. Mild residual. No pneumothorax or pulmonary edema. No pleural effusion or consolidation. Stable cardiac size and mediastinal contours. IMPRESSION: 1.  Stable lines and tubes. 2. Mildly improved lung volumes and regressed bibasilar opacity favored to be atelectasis. Electronically Signed   By: Genevie Ann M.D.   On: 06/23/2016 08:19   Dg Chest Port 1 View  Result Date: 06/22/2016 CLINICAL DATA:  61 year old female with history of abdominal aortic aneurysm. Shortness of breath and vomiting. EXAM: PORTABLE CHEST 1 VIEW COMPARISON:  Chest x-ray 06/21/2016. FINDINGS: There is a right upper extremity PICC with tip terminating in the superior cavoatrial junction. A nasogastric tube is seen extending into the stomach, however, the tip of the nasogastric tube extends below the lower margin of the image. Lung volumes are low. Linear bibasilar opacities favored to reflect areas of subsegmental atelectasis, however, underlying airspace consolidation from infection or aspiration is not excluded. No pleural effusions. No evidence of pulmonary edema. Heart size is normal. Upper mediastinal contours are within normal limits. IMPRESSION: 1. Support apparatus, as above. 2. Low lung volumes with bibasilar areas of atelectasis and/or airspace consolidation. Electronically Signed   By: Vinnie Langton M.D.   On: 06/22/2016 07:30   Dg Chest Port 1 View  Result Date: 06/21/2016 CLINICAL DATA:  Aortic dissection EXAM: PORTABLE CHEST 1 VIEW COMPARISON:  06/20/2016 FINDINGS:  Bibasilar atelectasis. Heart is normal size. No effusions or acute bony abnormality. IMPRESSION: Bibasilar atelectasis. Electronically Signed   By: Rolm Baptise M.D.   On: 06/21/2016 07:18   Dg Chest Port 1 View  Result Date: 06/20/2016 CLINICAL DATA:  Hypoxia, unresponsive EXAM: PORTABLE CHEST 1 VIEW COMPARISON:  CTA chest dated 06/20/2016 FINDINGS: Lungs are essentially clear. No focal  consolidation. No pleural effusion or pneumothorax. The heart is top-normal in size. IMPRESSION: No evidence of acute cardiopulmonary disease. Electronically Signed   By: Julian Hy M.D.   On: 06/20/2016 17:01   Dg Chest Port 1 View  Result Date: 06/20/2016 CLINICAL DATA:  Sudden onset sharp substernal chest pain that radiates to the back. Nausea, dizziness and shortness of breath. EXAM: PORTABLE CHEST 1 VIEW COMPARISON:  None. FINDINGS: Trachea is midline. Heart size is accentuated by AP supine technique. Probable mild scarring in the right middle lobe and lingula. Lungs are otherwise clear. No pleural fluid. IMPRESSION: No acute findings. Electronically Signed   By: Lorin Picket M.D.   On: 06/20/2016 09:56   Dg Abd Portable 1v  Result Date: 07/13/2016 CLINICAL DATA:  Initial evaluation for aortic dissection, abdominal distension. EXAM: PORTABLE ABDOMEN - 1 VIEW COMPARISON:  Prior radiograph from 07/11/2016. FINDINGS: Enteric to is scored within the stomach. Cholecystectomy clips noted. There are persistent scattered gas-filled loops of bowel within the abdomen. Overall volume gas is slightly decreased from prior, although the bowel gas pattern has not significantly changed. IMPRESSION: 1. Persistent gas-filled loops of small and large bowel within the abdomen, suggesting small and large bowel ileus. Overall, degree of gaseous distention is slightly improved from previous. 2. NG tube coiled within the stomach. Electronically Signed   By: Jeannine Boga M.D.   On: 07/13/2016 06:39   Dg Abd Portable  1v  Result Date: 07/11/2016 CLINICAL DATA:  61 year old female with type B aortic dissection, left renal infarcts, and bowel ileus. EXAM: PORTABLE ABDOMEN - 1 VIEW COMPARISON:  07/10/2016, CT Abdomen and Pelvis 07/07/2016, and earlier FINDINGS: KUB view of the abdomen at 0528 hours. NG tube looped in the stomach. Stable cholecystectomy clips. Continued gas-filled prominent large and small bowel loops throughout the abdomen. Mildly increased volume of bowel gas but otherwise the bowel gas pattern has not changed since 07/07/2016 where air-fluid levels were demonstrated by CT in both large and small bowel. IMPRESSION: 1. Stable bowel gas pattern since 07/07/2016 suggesting small and large bowel ileus. 2. Stable NG tube in the stomach. Electronically Signed   By: Genevie Ann M.D.   On: 07/11/2016 07:29   Dg Abd Portable 1v  Result Date: 07/10/2016 CLINICAL DATA:  Ileus.  Aortic dissection. EXAM: PORTABLE ABDOMEN - 1 VIEW COMPARISON:  CT scan July 07, 2016.  KUB July 08, 2016 FINDINGS: Persistent small bowel dilatation measuring up to 4.5 cm, mildly more prominent in the interval. The transverse colon is also air-filled but nondilated. The descending colon is relatively decompressed. An NG tube terminates in the stomach. No free air, portal venous gas, or pneumatosis on supine imaging. IMPRESSION: 1. The dilated small bowel loops are mildly more prominent in the interval. Electronically Signed   By: Dorise Bullion III M.D   On: 07/10/2016 07:28   Dg Abd Portable 1v  Result Date: 07/08/2016 CLINICAL DATA:  Abdominal distention EXAM: PORTABLE ABDOMEN - 1 VIEW COMPARISON:  July 07, 2016 FINDINGS: There may be slightly less bowel dilatation compared to 1 day prior. No air-fluid levels evident. No free air. Nasogastric tube tip and side port remain in stomach. IMPRESSION: Probable ileus with marginally less bowel dilatation compared to 1 day prior. No free air. Nasogastric tube tip and side port in stomach. Electronically  Signed   By: Lowella Grip III M.D.   On: 07/08/2016 08:00   Dg Abd Portable 1v  Result Date: 07/07/2016 CLINICAL DATA:  Feeding tube placement.  EXAM: PORTABLE ABDOMEN - 1 VIEW COMPARISON:  07/06/2016 FINDINGS: Interval placement of enteric tube which enters the stomach where it is coiled once and has tip and side-port over the gastric fundus just below the left hemidiaphragm. Air-filled loops of large and small bowel with dilated small bowel loop in the left abdomen measuring 5.7 cm in diameter as findings are not significantly changed. Remainder of the exam is unchanged. IMPRESSION: Persistent air-filled dilated small bowel with air throughout the colon. Nasogastric tube coiled once within the stomach with tip and side-port over the gastric fundus just below the left hemidiaphragm. Electronically Signed   By: Marin Olp M.D.   On: 07/07/2016 19:16   Dg Abd Portable 1v  Result Date: 07/06/2016 CLINICAL DATA:  Multiple episodes of vomiting, abdominal pain and distention. EXAM: PORTABLE ABDOMEN - 1 VIEW COMPARISON:  07/04/2016 FINDINGS: There is persistent dilation and wall edema of small bowel loops, with featureless appearance of some of the visualized small bowel loops. No radiographic evidence of organomegaly or free gas on this limited supine radiograph. IMPRESSION: Persistent dilation/wall edema and featureless appearance of small bowel loops in a nonspecific pattern. These findings may be seen with ileus, incomplete small bowel obstruction or enteritis. Ischemic enteritis continues to be of consideration. Electronically Signed   By: Fidela Salisbury M.D.   On: 07/06/2016 16:14   Dg Abd Portable 1v  Result Date: 07/04/2016 CLINICAL DATA:  Aortic dissection. EXAM: PORTABLE ABDOMEN - 1 VIEW COMPARISON:  07/02/2016 FINDINGS: Nasogastric tube tip in the region of the antrum/pylorus. Persistent group of dilated small bowel loops in the left mid abdomen with possible bowel edema. Possibility of  bowel ischemia certainly does exist. Similar appearance to the study of 07/02/2016. IMPRESSION: Nasogastric tube in place. Otherwise similar appearance with abnormal small bowel pattern in the left central abdomen which could go along with partial small bowel obstruction or a focal enteritis including ischemic. Electronically Signed   By: Nelson Chimes M.D.   On: 07/04/2016 08:13   Dg Abd Portable 1v  Result Date: 07/02/2016 CLINICAL DATA:  Nasogastric tube removed.  Acute abdominal pain. EXAM: PORTABLE ABDOMEN - 1 VIEW COMPARISON:  06/27/2016 FINDINGS: Nasogastric tube is been removed. There is a group of dilated fluid and air-filled loops of small intestine in the left abdomen with apparent wall thickening. This could be due to partial obstruction or enteritis, including ischemic bowel insult. IMPRESSION: Nasogastric tube removed. Abnormal dilated small intestine in the left central abdomen. Differential diagnosis partial small bowel obstruction versus enteritis, including ischemic. Electronically Signed   By: Nelson Chimes M.D.   On: 07/02/2016 07:37   Dg Abd Portable 1v  Result Date: 06/27/2016 CLINICAL DATA:  Ileus. EXAM: PORTABLE ABDOMEN - 1 VIEW COMPARISON:  Jun 26, 2016 FINDINGS: The NG tube terminates within the stomach. The bowel gas pattern is unremarkable on today's study. No other acute abnormalities. IMPRESSION: The NG tube terminates within the stomach. The bowel gas pattern is unremarkable. Electronically Signed   By: Dorise Bullion III M.D   On: 06/27/2016 07:19   Dg Abd Portable 1v  Result Date: 06/26/2016 CLINICAL DATA:  Shortness of breath, abdominal pain EXAM: PORTABLE ABDOMEN - 1 VIEW COMPARISON:  06/25/2016 FINDINGS: NG tube tip is in the distal stomach. Prior cholecystectomy. Nonobstructive bowel gas pattern. No free air organomegaly. IMPRESSION: NG tube tip in the distal stomach.  No acute findings. Electronically Signed   By: Rolm Baptise M.D.   On: 06/26/2016 07:36   Dg Abd  Portable 1v  Result Date: 06/25/2016 CLINICAL DATA:  Shortness of breath.  Abdominal pain. EXAM: PORTABLE ABDOMEN - 1 VIEW COMPARISON:  Jun 24, 2016 FINDINGS: The distal tip of the NG tube is near the gastric antrum. Mild opacity in left lung base will be better assessed on today's chest x-ray. No free air or portal venous gas identified although evaluation is limited on supine imaging. No bowel obstruction. IMPRESSION: No interval change or acute abnormality in the abdomen. Electronically Signed   By: Dorise Bullion III M.D   On: 06/25/2016 07:54   Dg Abd Portable 1v  Result Date: 06/24/2016 CLINICAL DATA:  Abdominal distention. EXAM: PORTABLE ABDOMEN - 1 VIEW COMPARISON:  06/23/2016 . FINDINGS: NG tube noted in stable position with tip in the stomach . Surgical clips right upper quadrant. Several nonspecific loops of air-filled small bowel again noted. Colonic gas pattern is normal. No free air. IMPRESSION: 1.  NG tube in stable position. 2. Several nonspecific loops of air-filled small bowel again noted. No evidence of progressive bowel distention. Colonic gas pattern normal. No free air. Electronically Signed   By: Marcello Moores  Register   On: 06/24/2016 08:06   Dg Abd Portable 1v  Result Date: 06/23/2016 CLINICAL DATA:  61 year old female with shortness of breath and abdominal pain. 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 Being treated conservatively at this time, including with bowel rest. EXAM: PORTABLE ABDOMEN - 1 VIEW COMPARISON:  Abdominal radiographs 06/22/2016. CTA 06/20/2016, and earlier. FINDINGS: Portable AP supine view at 0759 hours. Stable NG tube, side hole to level of the gastric body and tip at the level of the gastric antrum. Stable cholecystectomy clips. Stable bowel gas pattern with several gas containing nondilated small and large bowel loops. No definite pneumoperitoneum on  this supine view. Abdominal and pelvic visceral contours appear stable. No acute osseous abnormality identified. IMPRESSION: 1. Stable NG tube position. 2. Stable, nonobstructed bowel-gas pattern. Electronically Signed   By: Genevie Ann M.D.   On: 06/23/2016 08:18   Dg Abd Portable 1v  Result Date: 06/22/2016 CLINICAL DATA:  Abdominal pain for few days with nausea and vomiting. EXAM: PORTABLE ABDOMEN - 1 VIEW COMPARISON:  06/22/2016 abdominal radiograph. FINDINGS: Enteric tube loops in the gastric fundus and terminates in the body of the stomach, without appreciable kink. Top-normal caliber small bowel loops throughout the abdomen. Minimal colonic stool. No evidence of pneumatosis or pneumoperitoneum. No radiopaque urolithiasis. Cholecystectomy clips are seen in the right upper quadrant of the abdomen. Patchy opacities at the lung bases. IMPRESSION: 1. Enteric tube loops in the gastric fundus and terminates in the body of the stomach without appreciable kink. 2. Top-normal caliber small bowel loops, unchanged. 3. Patchy bibasilar lung opacities, correlate with chest radiograph. Electronically Signed   By: Ilona Sorrel M.D.   On: 06/22/2016 16:58   Dg Abd Portable 1v  Result Date: 06/22/2016 CLINICAL DATA:  Vomiting.  Shortness of breath.  AAA . EXAM: PORTABLE ABDOMEN - 1 VIEW COMPARISON:  06/21/2016. FINDINGS: Surgical clips right upper quadrant. NG tube noted with tip over the distal stomach. No bowel distention. No acute bony abnormality identified. Mild basilar atelectasis. IMPRESSION: NG tube noted with its tip over the distal stomach. No bowel distention or acute intra-abdominal abnormality identified. Electronically Signed   By: Marcello Moores  Register   On: 06/22/2016 07:30   Dg Abd Portable 1v  Result Date: 06/21/2016 CLINICAL DATA:  Feeding tube placement EXAM: PORTABLE ABDOMEN - 1 VIEW COMPARISON:  06/21/2016 FINDINGS: NG tube coils in the fundus of the stomach with the tip in the distal stomach.  Decompression of the stomach. IMPRESSION: NG tube tip in the distal stomach. Electronically Signed   By: Rolm Baptise M.D.   On: 06/21/2016 15:46   Dg Abd Portable 1v  Result Date: 06/21/2016 CLINICAL DATA:  Ileus  Vomiting that started this AM per patient EXAM: PORTABLE ABDOMEN - 1 VIEW COMPARISON:  06/20/2016 FINDINGS: Gaseous distention of the stomach. Paucity of small bowel and colonic gas. Cholecystectomy clips. Linear scarring/ atelectasis in the lung bases. IMPRESSION: 1. Gaseous distention of the stomach. Electronically Signed   By: Lucrezia Europe M.D.   On: 06/21/2016 11:54   Dg Abd Portable 1v  Result Date: 06/20/2016 CLINICAL DATA:  Aortic dissection EXAM: PORTABLE ABDOMEN - 1 VIEW COMPARISON:  CT abdomen/ pelvis dated 06/20/2016 FINDINGS: Nonobstructive bowel gas pattern. Visualized osseous structures are within normal limits. IMPRESSION: Unremarkable abdominal radiograph. Electronically Signed   By: Julian Hy M.D.   On: 06/20/2016 17:02   Ct Angio Chest/abd/pel For Dissection W And/or W/wo  Result Date: 06/27/2016 CLINICAL DATA:  61 year old female with a history of type B dissection EXAM: CT ANGIOGRAPHY CHEST, ABDOMEN AND PELVIS TECHNIQUE: Multidetector CT imaging through the chest, abdomen and pelvis was performed using the standard protocol during bolus administration of intravenous contrast. Multiplanar reconstructed images and MIPs were obtained and reviewed to evaluate the vascular anatomy. CONTRAST:  100 cc Isovue 370 COMPARISON:  CT 06/20/2016 FINDINGS: CTA CHEST FINDINGS Cardiovascular: Heart: Heart size unchanged. No pericardial fluid/ thickening. No significant coronary calcifications. Aorta: Re- demonstration of type B dissection with the entry tear appearing to originate just beyond the origin of the left subclavian artery. Branch vessels remain patent without extension of the dissection flap into the branch vessels. Caliber and contour of the ascending aorta unremarkable  without evidence of retrograde extension. Greatest diameter of the ascending aorta 2.9 cm. Inflammatory changes surrounding the distal aortic arch in the proximal descending aorta. Greatest diameter of the distal aortic arch measures approximately 3.4 cm. Greatest diameter on the comparison CT approximately 3.0 cm. No aneurysm of the descending thoracic aorta with the greatest diameter of the false lumen measuring 2.7 cm. True lumen is compressed. There is a fenestration in the distal true lumen at the level of the diaphragm just above the aortic hiatus. Pulmonary arteries: No lobar, segmental, or proximal subsegmental filling defects. Mediastinum/Nodes: No mediastinal hemorrhage. Mediastinal lymph nodes are present. Gastric tube within the esophagus. Lungs/Pleura: Ground-glass opacities developing through the the bilateral lungs. Developing interlobular septal thickening. Atelectasis of the medial segment left lower lobe. Small low-density left pleural effusion trace right-sided pleural effusion and associated atelectasis. No pneumothorax. Right upper extremity PICC appears to terminate superior vena cava. Review of the MIP images confirms the above findings. CTA ABDOMEN AND PELVIS FINDINGS VASCULAR Aorta: Re- demonstration of dissection flap of the abdominal aorta. No aneurysm.  No periaortic fluid of the abdomen. The true lumen is compressed throughout the abdomen. True lumen contributes to celiac artery origin, superior mesenteric artery origin, and also continues across the origin of the inferior mesenteric artery. The false lumen contributes to perfusion of the right renal artery. The lateral margin of the dissection flap involves the origin of 2 left renal arteries both superior and inferior. True lumen is decompressed in the inferior aorta extending into the bilateral iliac arteries. The bilateral common iliac arteries appear  perfused from the false lumen bilaterally. False lumen extends in the bilateral  external iliac artery. Likely re- entry tear of distal external iliac arteries bilaterally, on the right image 173, on the left image 171. Dissection flap extends into the bilateral hypogastric arteries which are partially patent with decreased flow. Celiac: Origin of the celiac artery originates from the true lumen which is decompressed. Celiac artery remains patent at this time including the branch vessels. Typical branch pattern of splenic artery, left gastric artery, common hepatic artery. SMA: Superior mesenteric artery origin originates from the true lumen which is compressed. SMA remains patent at this time. Renals: Right renal artery originates from the false lumen. Right renal artery is perfusing at this time with uniform perfusion of the right kidney. There are 2 left renal arteries, superior and inferior. Both arteries originate near the margin of the dissection flap. The superior left renal artery is partially filling, at the in flexion point of the dissection flap, perfusing superior kidney. The inferior left renal artery appears thrombosed, new from the comparison with enlarging left renal infarction, predominantly of the lower pole. IMA: Origin of the inferior mesenteric artery is thrombosed given the collapsed true lumen at this level. There is re- constitution of the distal inferior mesenteric artery via collateral flow. Right lower extremity: False lumen perfuses the right common iliac artery with collapse of the true lumen. Dissection flap extends into the hypogastric artery, with the distal pelvic branch is partially opacifying. External iliac artery is perfusing from the false lumen, with unremarkable appearance of the distal external iliac artery, common femoral artery, profunda femoris, SFA. There is the appearance of a re- entry tear in the mid right external iliac artery. Left lower extremity: False lumen perfuses the left common iliac artery with collapse of the true lumen. Dissection  extends into the hypogastric artery which is partially perfused with opacification of pelvic vessels. Distal external iliac artery an the common femoral artery unremarkable, with patent proximal femoral vessels. There is the appearance of a re- entry tear in the mid left external iliac artery. Veins: Unremarkable appearance of the venous system. Review of the MIP images confirms the above findings. NON-VASCULAR Hepatobiliary: Unremarkable appearance of the liver. Cholecystectomy Pancreas: Unremarkable appearance of the pancreas. No pericholecystic fluid or inflammatory changes. Unremarkable ductal system. Spleen: Unremarkable. Adrenals/Urinary Tract: Right adrenal gland unremarkable. Nodule of the left adrenal gland which measures 2.7 cm. Right: Right-sided kidney perfuses uniformly with no hydronephrosis. Left: Progressive left renal infarct with small portion of the superior kidney perfusing. The remainder of the left kidney is hypoperfused, progressed from the comparison. No hydronephrosis. Urinary catheter within the urinary bladder. Stomach/Bowel: Unremarkable appearance of stomach with gastric tube in place. Small bowel borderline dilated and fluid-filled. No transition point. The wall of the small bowel relatively uniformly thin and enhancing, although the study is timed for the arterial phase. No focal wall thickening. No abnormally distended colon. No transition point. Fluid filled colon. No focal wall thickening or pericolonic inflammatory changes. Lymphatic: Multiple lymph nodes in the para-aortic nodal station, none of which are enlarged. Mesenteric: No free fluid or air. No adenopathy. Reproductive: Hysterectomy Other: No hernia. Musculoskeletal: No displaced fracture. Degenerative changes of the spine. 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 Only 1 possible fenestration is identified, in the distal thoracic aorta above  the aortic hiatus. There is enlarging diameter of the distal aortic arch with associated inflammatory changes, measuring approximately  3.5 on today's study compared to approximately 3.1 cm previously. The appearance is concerning for progression/expansion of intramural hematoma. Progressing left renal infarction, with near complete thrombosis of superior and inferior renal arteries, both of which originate at the margin of the dissection flap. Bilateral common iliac arteries appear to be perfused from the false lumen, with complete collapse of true lumen proximally. There does appear to be re- entry to the true lumen in the mid external iliac artery bilaterally, as there is unremarkable appearance of the proximal femoral vasculature including bilateral common iliac arteries. The 3 mesenteric vessels originate from the small true lumen, which is progressively compressed. Celiac artery and superior mesenteric artery remain patent, with occlusion at the origin of the inferior mesenteric artery. Three vessel arch, with all 3 branches remain patent. No evidence of extension of the dissection flap into the branch vessels. These preliminary results were discussed by telephone at the time of interpretation on 06/27/2016 at 12:41 pm with Dr. Curt Jews. Borderline dilated small bowel without transition point or obstruction. Findings may represent ileus and/or nonspecific enteritis. No focal wall thickening to suggest Jedediah Noda ischemia, although the timing of this CTA is specific for arterial evaluation and not the bowel tissues. If ongoing concern for bowel ischemia, would repeat abd/pelvis contrast enhanced-CT portal venous phase. Similar appearance of mixed geographic ground-glass opacities of the bilateral lungs with mild interlobular septal thickening. Again, differential diagnosis includes pulmonary edema, ARDS, atypical infection. Signed, Dulcy Fanny. Earleen Newport, DO [facility]  Radiology Electronically Signed   By: Corrie Mckusick D.O.   On: 06/27/2016 13:18   Ct Angio Chest/abd/pel For Dissection W And/or W/wo  Result Date: 06/20/2016 CLINICAL DATA:  Substernal chest pain radiating to back EXAM: CT ANGIOGRAPHY CHEST, ABDOMEN AND PELVIS TECHNIQUE: Multidetector CT imaging through the chest, abdomen and pelvis was performed using the standard protocol during bolus administration of intravenous contrast. Multiplanar reconstructed images and MIPs were obtained and reviewed to evaluate the vascular anatomy. CONTRAST:  100 cc Isovue 370 IV COMPARISON:  CT abdomen and pelvis 10/27/2011 FINDINGS: CTA CHEST FINDINGS Cardiovascular: There is the type B aortic dissection beginning in the distal aortic arch just beyond the origin of the great vessels. The true lumen is compressed by the false lumen. No aneurysm. No pulmonary embolus. Heart is normal size. Mediastinum/Nodes: No mediastinal, hilar, or axillary adenopathy. Lungs/Pleura: Atelectasis or scarring in the lingula. Lungs otherwise clear. No effusions. Musculoskeletal: No acute bony abnormality. Review of the MIP images confirms the above findings. CTA ABDOMEN AND PELVIS FINDINGS VASCULAR Aorta: Dissection continues throughout the abdominal aorta with the celiac artery and superior mesenteric artery arising from the true lumen anteriorly. The dissection may extend into the left renal artery where there is only a small amount of blood flow noted in the proximal and mid left renal artery. Right renal artery appears to arise from the false lumen and is patent. The the true lumen appears thrombosed below the origin of the superior mesenteric artery. Inferior mesenteric artery arises from the thrombosed true lumen and appears occluded proximally, reconstitutes several cm from the origin. Celiac: Patent. SMA: Patent. Renals: As above. IMA: As above. Inflow: To the dissection continues into both common iliac arteries with thrombosed true lumens.  The dissection appears to terminate at approximately the level of the common iliac bifurcation bilaterally. Veins: Grossly patent and unremarkable. Review of the MIP images confirms the above findings. NON-VASCULAR Hepatobiliary: Mild diffuse fatty infiltration. Prior cholecystectomy. Pancreas: No focal abnormality  or ductal dilatation. Spleen: No focal abnormality.  Normal size. Adrenals/Urinary Tract: Nodules in the left adrenal gland are low-density on the precontrast imaging compatible with small adenomas. There are areas of non perfusion noted in the mid and lower poles of the left kidney compatible with infarction, likely related to the involvement of the left renal artery by dissection. Urinary bladder unremarkable. Stomach/Bowel: Stomach, large and small bowel grossly unremarkable. Lymphatic: No adenopathy. Reproductive: Prior hysterectomy.  No adnexal masses. Other: No free fluid or free air. Musculoskeletal: No acute bony abnormality. Review of the MIP images confirms the above findings. 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 This involves the descending thoracic aorta, abdominal aorta and iliac vessels. The true lumen is compressed by the larger false lumen, and is thrombosed below the SMA/renal artery origins. The dissection appears to extend into the left renal artery with partial occlusion and areas of infarct in the mid and lower pole of the left kidney. Fatty infiltration of the liver. Critical Value/emergent results were called by telephone at the time of interpretation on 06/20/2016 at 10:13 am to Dr. Fredia Sorrow , who verbally acknowledged these results. Electronically Signed   By: Rolm Baptise M.D.   On: 06/20/2016 10:15   Anti-infectives: Anti-infectives    Start     Dose/Rate Route Frequency Ordered Stop   06/26/16 0830  meropenem (MERREM) 1 g in sodium chloride 0.9 % 100 mL IVPB  Status:  Discontinued     1  g 200 mL/hr over 30 Minutes Intravenous Every 8 hours 06/26/16 0810 07/06/16 0804   06/22/16 1800  clindamycin (CLEOCIN) IVPB 600 mg  Status:  Discontinued     600 mg 100 mL/hr over 30 Minutes Intravenous Every 8 hours 06/22/16 1723 06/26/16 0810   06/22/16 1000  azithromycin (ZITHROMAX) 250 mg in dextrose 5 % 125 mL IVPB  Status:  Discontinued     250 mg 125 mL/hr over 60 Minutes Intravenous Every 24 hours 06/22/16 0826 06/27/16 1019   06/22/16 0900  levofloxacin (LEVAQUIN) IVPB 500 mg  Status:  Discontinued     500 mg 100 mL/hr over 60 Minutes Intravenous Every 24 hours 06/22/16 0759 06/22/16 0824      Assessment/Plan: s/p * No surgery found * Afebrile. Normal white count. Still with prolonged ileus.   LOS: 23 days   EarlySherren Mocha 07/13/2016, 1:04 PM

## 2016-07-14 DIAGNOSIS — I7102 Dissection of abdominal aorta: Secondary | ICD-10-CM

## 2016-07-14 LAB — PHOSPHORUS: Phosphorus: 3.7 mg/dL (ref 2.5–4.6)

## 2016-07-14 LAB — MAGNESIUM: Magnesium: 1.9 mg/dL (ref 1.7–2.4)

## 2016-07-14 LAB — GLUCOSE, CAPILLARY
Glucose-Capillary: 147 mg/dL — ABNORMAL HIGH (ref 65–99)
Glucose-Capillary: 147 mg/dL — ABNORMAL HIGH (ref 65–99)
Glucose-Capillary: 176 mg/dL — ABNORMAL HIGH (ref 65–99)
Glucose-Capillary: 108 mg/dL — ABNORMAL HIGH (ref 65–99)
Glucose-Capillary: 187 mg/dL — ABNORMAL HIGH (ref 65–99)

## 2016-07-14 LAB — BASIC METABOLIC PANEL
Anion gap: 6 (ref 5–15)
BUN: 32 mg/dL — ABNORMAL HIGH (ref 6–20)
CO2: 21 mmol/L — ABNORMAL LOW (ref 22–32)
Calcium: 7.6 mg/dL — ABNORMAL LOW (ref 8.9–10.3)
Chloride: 104 mmol/L (ref 101–111)
Creatinine, Ser: 1.03 mg/dL — ABNORMAL HIGH (ref 0.44–1.00)
GFR calc Af Amer: >60 mL/min (ref >60)
GFR calc non Af Amer: 58 mL/min — ABNORMAL LOW (ref >60)
Glucose, Bld: 139 mg/dL — ABNORMAL HIGH (ref 65–99)
Potassium: 3.7 mmol/L (ref 3.5–5.1)
Sodium: 131 mmol/L — ABNORMAL LOW (ref 135–145)

## 2016-07-14 MED ORDER — FENTANYL CITRATE (PF) 100 MCG/2ML IJ SOLN
25.0000 ug | Freq: Two times a day (BID) | INTRAMUSCULAR | Status: DC | PRN
  Administered 2016-07-15 – 2016-07-28 (×6): 25 ug via INTRAVENOUS
  Filled 2016-07-14 (×7): qty 2

## 2016-07-14 MED ORDER — FAT EMULSION 20 % IV EMUL
240.0000 mL | INTRAVENOUS | Status: AC
  Administered 2016-07-14: 240 mL via INTRAVENOUS
  Filled 2016-07-14: qty 240

## 2016-07-14 MED ORDER — NAPROXEN 250 MG PO TABS
250.0000 mg | ORAL_TABLET | Freq: Two times a day (BID) | ORAL | Status: DC | PRN
  Administered 2016-07-14 – 2016-07-20 (×4): 250 mg via ORAL
  Filled 2016-07-14 (×7): qty 1

## 2016-07-14 MED ORDER — BOOST / RESOURCE BREEZE PO LIQD
1.0000 | Freq: Two times a day (BID) | ORAL | Status: DC
  Administered 2016-07-14: 1 via ORAL
  Administered 2016-07-15: 16:00:00 via ORAL
  Administered 2016-07-18 – 2016-08-09 (×15): 1 via ORAL

## 2016-07-14 MED ORDER — POTASSIUM CHLORIDE 10 MEQ/50ML IV SOLN
10.0000 meq | INTRAVENOUS | Status: AC
  Administered 2016-07-14 (×5): 10 meq via INTRAVENOUS
  Filled 2016-07-14 (×5): qty 50

## 2016-07-14 MED ORDER — LEVOFLOXACIN 500 MG PO TABS
500.0000 mg | ORAL_TABLET | Freq: Every day | ORAL | Status: DC
  Filled 2016-07-14: qty 1

## 2016-07-14 MED ORDER — METOPROLOL TARTRATE 25 MG PO TABS
25.0000 mg | ORAL_TABLET | Freq: Two times a day (BID) | ORAL | Status: DC
  Administered 2016-07-15 – 2016-07-21 (×13): 25 mg via ORAL
  Filled 2016-07-14 (×13): qty 1

## 2016-07-14 MED ORDER — M.V.I. ADULT IV INJ
INTRAVENOUS | Status: AC
  Administered 2016-07-14: 17:00:00 via INTRAVENOUS
  Filled 2016-07-14: qty 1992

## 2016-07-14 MED ORDER — MAGNESIUM SULFATE 2 GM/50ML IV SOLN
2.0000 g | Freq: Once | INTRAVENOUS | Status: AC
  Administered 2016-07-14: 2 g via INTRAVENOUS
  Filled 2016-07-14: qty 50

## 2016-07-14 NOTE — Progress Notes (Signed)
Nutrition Follow-up  DOCUMENTATION CODES:   Obesity unspecified  INTERVENTION:   TPN per Pharmacy  Offer oral diet as able.  Boost Breeze po BID, each supplement provides 250 kcal and 9 grams of protein   NUTRITION DIAGNOSIS:   Inadequate oral intake related to altered GI function as evidenced by NPO status. Ongoing.   GOAL:   Patient will meet greater than or equal to 90% of their needs Met.   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 Pt admitted for pain control, blood pressure control, bed rest and IV hydration.   NG removed 6/11, no vomiting Per pt and husband she is tolerating liquids PO and is willing to try Boost Breeze.   Medications reviewed and include: KCl 22mq runs x 5 and magnesium sulfate 2 g IV x 1 Labs reviewed: Na 131 (L) CBG's: [id number] stable  TPN: Clinimix E 5/15 @ 83 ml/hr, 20% ILE @ 20 ml/hr x 12 hours Provides: 1894 kcal (95% kcal needs) and 100 grams protein (91% of needs)  Diet Order:  Diet clear liquid Room service appropriate? Yes; Fluid consistency: Thin TPN (CLINIMIX-E) Adult TPN (CLINIMIX-E) Adult  Skin:  Reviewed, no issues  Last BM:  6/11  Height:   Ht Readings from Last 1 Encounters:  07/06/16 '5\' 3"'  (1.6 m)    Weight:   Wt Readings from Last 1 Encounters:  07/14/16 178 lb (80.7 kg)    Ideal Body Weight:  52.2 kg  BMI:  Body mass index is 31.53 kg/m.  Estimated Nutritional Needs:   Kcal:  2000-2200  Protein:  110-120 gm  Fluid:  per MD  EDUCATION NEEDS:   No education needs identified at this time  [place] COakviewPager [phone] Hours Pager

## 2016-07-14 NOTE — Progress Notes (Signed)
  PHARMACY - ADULT TOTAL PARENTERAL NUTRITION CONSULT NOTE   Pharmacy Consult for TPN Indication: prolonged NPO status/high NG output   Patient Measurements: Height: _0  (160 cm) Weight: 178 lb (80.7 kg) IBW/kg (Calculated) : 52.4 TPN AdjBW (KG): 59.4 Body mass index is 31.53 kg/m. 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 Patient had a significant hx of N/V for 2-4 weeks prior to admission. Weight appears to have been maintained. Patient had a NG tube placed 5/20 which had significant output with 1-1.5 L a day, removed NG tube on 5/30, however, patient with significant episodes of N&V and NG replaced 6/1 with continuous low suction. NG tube removed but multiple episodes of vomiting with CL trial 6/4 & NG tube replaced again 6/5.Tolerated longer clamping trial 6/11 an NGT removed   GI: Ileus vs gastroparesis. Repeat CT 6/5 (-)perf/pneumatosis, unresolved SB dilatation. NGT removed 6/11 Tolerating clears and having BMs.  Phenergan q6h prn (last received 6/4), ondansetron q6h prn (received this morning), bisacodyl, simethicone, PPI-IV Endo: Hx of DM. No insulin at home. CBGs 138-151 Insulin requirements in the past 24 hours: 16 units SSI, 80 units of Lantus (split 40 BID), 35 units in TPN (since 6/9) Lytes:  K 3.7 (goal >4 w/ ileus), Mg 1.9 (goal >2 w/ ileus), Phos 3.7, Na 131. Lasix 95m IV qday. Renal: AKI:  SCr down to 1.03 which appears back at baseline. UOP0.475mkg/hr on Lasix.  Pulm:96/2L [place]; Brovana, Pulmicort nebs continued Cards: Stanford type B aortic dissection; BP soft-nml, HR 80-110s in NSR-ST. Metoprolol IV, clonidine 0.3 mg patch , nitroglycerin 0.24m65match, lasix IV qday, hydralazine IV.  Hepatobil: ALT 56 (slightly above normal), AST, alk phos, and TBili remain WNL. Triglycerides 132.  Pre-albumin down to 10.4. Neuro: Fentanyl, Dialudid prn - weaning narcotics to help with ileus.  ID:  S/P Meropenem (5/25-6/4) for respiratory failure from PNA. WBC 9.8 (peaked at 36.7), afebrile.  All cx negative, CDiff neg.  Best Practices:PPI, mc TPN Access:PICC line placed 5/20 TPN start date:5/22  Nutritional Goals (per RD recommendation on 5/25): KCal: 2000-2200 kcal/day  Protein: 110-120 gm/day   Goal  TPN rate: Clinimix 5/15 E @ 83 ml/hr + lipid emulsion 20 ml/hr   Current Nutrition:   TPN- Clinimix E 5/15 at 83 ml/hr + lipid emulsion 20% at 20 ml/hr Clear liquids- fair/poor intake  Plan:  -KCl 40m30muns x5 and magnesium sulfate 2g IV x1 for replacement -Continue Clinimix E 5/15 at 83 ml/hr + lipid emulsion 20% at 20 ml/hr - provides 100g of protein and 1894 kCals per day meeting 91% of protein and 95% of kCal needs -Continue daily MVI in TPN + trace elements every other day, next 6/13 -Continue Insulin 35 units in TPN -Continue Lantus 40 units BID + TCTS SSI q4h -BMET, magnesium and phosphorous to be checked in the morning  -Follow-up enteral diet progression and ability to wean TPN   Juliett Eastburn D. Gustav Knueppel, PharmD, BCPS Clinical Pharmacist Pager: [phone] Phone for 07/14/2016 until 3:30pm: [phone] 3:30pm, please call main pharmacy at [phone] 07/14/2016 7:23 AM

## 2016-07-14 NOTE — Progress Notes (Signed)
CSW received consult regarding disability questions. Patient's spouse  wanted to know how to apply for disability for the patient-CSW directed him to the [place] center. He also wanted patient placed in a SNF, but CSW explained that patient is alert and oriented and would have to be agreeable to going (patient has refused so far).   CSW signing off. Please re-consult if patient changes her mind about placement.   Percell Locus Uriah Trueba LCSWA [phone]

## 2016-07-14 NOTE — Progress Notes (Signed)
Subjective: Interval History: none.Marland Kitchen Up in chair. Reports that she gets anxious. Denies abdominal pain. NG is out with no vomiting.  Objective: Vital signs in last 24 hours: Temp:  [97.6 F (36.4 C)-101.1 F (38.4 C)] 99 F (37.2 C) (06/11 2330) Pulse Rate:  [84-117] 99 (06/12 0700) Resp:  [18-26] 23 (06/12 0700) BP: (88-138)/(37-104) 121/53 (06/12 0700) SpO2:  [93 %-98 %] 93 % (06/12 0700) Weight:  [178 lb (80.7 kg)] 178 lb (80.7 kg) (06/12 0630)  Intake/Output from previous day: 06/11 0701 - 06/12 0700 In: 3114 [P.O.:525; I.V.:2369; NG/GT:20; IV Piggyback:200] Out: 700 [Urine:700] Intake/Output this shift: No intake/output data recorded.  Abdomen soft. No tenderness. Continued distention.  Lab Results:  Recent Labs  07/13/16 0500  WBC 9.8  HGB 8.6*  HCT 26.8*  PLT 122*   BMET  Recent Labs  07/13/16 0500 07/14/16 0510  NA 134* 131*  K 3.6 3.7  CL 106 104  CO2 21* 21*  GLUCOSE 140* 139*  BUN 31* 32*  CREATININE 1.10* 1.03*  CALCIUM 7.8* 7.6*    Studies/Results: Ct Abdomen Pelvis Wo Contrast  Result Date: 06/26/2016 CLINICAL DATA:  Shortness of breath EXAM: CT CHEST, ABDOMEN AND PELVIS WITHOUT CONTRAST TECHNIQUE: Multidetector CT imaging of the chest, abdomen and pelvis was performed following the standard protocol without IV contrast. COMPARISON:  06/20/2016 chest CT FINDINGS: CT CHEST FINDINGS Cardiovascular: Normal heart size. No pericardial effusion. 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 Mild haziness around the upper descending segment is likely stable. Stable maximal diameter of 33 mm at this level. No intramural hematoma. Intermittently seen displaced intimal calcification. Mediastinum/Nodes: Negative for adenopathy. Right upper extremity PICC with tip at the SVC level. Lungs/Pleura: Multi segment atelectasis, worse on the left. Small pleural effusions. There is patchy ground-glass airspace density in the bilateral lungs without  gradient. Airways are clear and there is no septal thickening. Musculoskeletal: No acute or aggressive finding. CT ABDOMEN PELVIS FINDINGS Hepatobiliary: Hepatic steatosis.Cholecystectomy with normal common bile duct diameter. Pancreas: Unremarkable. Spleen: Unremarkable. 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 The larger measures up to 27 mm and is indeterminate by densitometry, but left adrenal mass has been noted since at least 2006 abdominal CT report, images not available. Left renal cyst. No hydronephrosis or urolithiasis. 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 Negative decompressed urinary bladder. Stomach/Bowel: There is diffuse small bowel distention and fluid filling with mildly hazy mesenteries. Similar features in the colon proximally and at the transverse segment. No pneumatosis or perforation is noted. Nasogastric tube tip is at the pylorus. Vascular/Lymphatic: There is intimal flap displacement from known dissection, morphology appearing similar to prior. No mass or adenopathy. Reproductive:Hysterectomy.  Unremarkable ovaries. Other: No ascites or pneumoperitoneum.  Anasarca. Musculoskeletal: No acute abnormalities. These results were called by telephone at the time of interpretation on 06/26/2016 at 2:05 pm to Dr. Ivin Poot , who verbally acknowledged these results. IMPRESSION: 1. Diffuse airspace disease. Pattern can be seen with noncardiogenic edema (ARDS), inflammatory pneumonitis, or atypical infection. Multi segment atelectasis at the bases and small pleural effusions. 2. Diffuse small bowel and colonic distention with fluid levels as seen with ileus. Question underlying bowel ischemia given the patient's known aortic dissection with proximal IMA occlusion and SMA flow via the narrow true lumen. 3. Incidental findings noted above. Electronically Signed   By: Monte Fantasia M.D.   On:  06/26/2016  14:05   Ct Chest Wo Contrast  Result Date: 06/26/2016 CLINICAL DATA:  Shortness of breath EXAM: CT CHEST, ABDOMEN AND PELVIS WITHOUT CONTRAST TECHNIQUE: Multidetector CT imaging of the chest, abdomen and pelvis was performed following the standard protocol without IV contrast. COMPARISON:  06/20/2016 chest CT FINDINGS: CT CHEST FINDINGS Cardiovascular: Normal heart size. No pericardial effusion. 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 Mild haziness around the upper descending segment is likely stable. Stable maximal diameter of 33 mm at this level. No intramural hematoma. Intermittently seen displaced intimal calcification. Mediastinum/Nodes: Negative for adenopathy. Right upper extremity PICC with tip at the SVC level. Lungs/Pleura: Multi segment atelectasis, worse on the left. Small pleural effusions. There is patchy ground-glass airspace density in the bilateral lungs without gradient. Airways are clear and there is no septal thickening. Musculoskeletal: No acute or aggressive finding. CT ABDOMEN PELVIS FINDINGS Hepatobiliary: Hepatic steatosis.Cholecystectomy with normal common bile duct diameter. Pancreas: Unremarkable. Spleen: Unremarkable. 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 The larger measures up to 27 mm and is indeterminate by densitometry, but left adrenal mass has been noted since at least 2006 abdominal CT report, images not available. Left renal cyst. No hydronephrosis or urolithiasis. 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 Negative decompressed urinary bladder. Stomach/Bowel: There is diffuse small bowel distention and fluid filling with mildly hazy mesenteries. Similar features in the colon proximally and at the transverse segment. No pneumatosis or perforation is noted. Nasogastric tube tip is at the pylorus. Vascular/Lymphatic: There is intimal flap  displacement from known dissection, morphology appearing similar to prior. No mass or adenopathy. Reproductive:Hysterectomy.  Unremarkable ovaries. Other: No ascites or pneumoperitoneum.  Anasarca. Musculoskeletal: No acute abnormalities. These results were called by telephone at the time of interpretation on 06/26/2016 at 2:05 pm to Dr. Ivin Poot , who verbally acknowledged these results. IMPRESSION: 1. Diffuse airspace disease. Pattern can be seen with noncardiogenic edema (ARDS), inflammatory pneumonitis, or atypical infection. Multi segment atelectasis at the bases and small pleural effusions. 2. Diffuse small bowel and colonic distention with fluid levels as seen with ileus. Question underlying bowel ischemia given the patient's known aortic dissection with proximal IMA occlusion and SMA flow via the narrow true lumen. 3. Incidental findings noted above. Electronically Signed   By: Monte Fantasia M.D.   On: 06/26/2016 14:05   Ct Abdomen Pelvis W Contrast  Result Date: 07/07/2016 CLINICAL DATA:  61 year old female with abdominal pain and distension greater on the right. Subsequent encounter. EXAM: CT ABDOMEN AND PELVIS WITH CONTRAST TECHNIQUE: Multidetector CT imaging of the abdomen and pelvis was performed using the standard protocol following bolus administration of intravenous contrast. CONTRAST:  75 cc Isovue 300. COMPARISON:  07/06/2016 plain film exam. 06/27/2016 CT angiogram chest, abdomen and pelvis. 10/27/2011 FINDINGS: Lower chest: Basilar atelectasis greatest medial left lung base. Heart top-normal size with aortic/mitral valve calcification. Hepatobiliary: Mild fatty infiltration of the liver without worrisome hepatic lesion. Post cholecystectomy. Pancreas: No mass or inflammation.  No duct dilation. Spleen: No mass or enlargement. Adrenals/Urinary Tract: Prominent infarct left kidney. Left renal cyst. Nodularity adrenal glands greater on the left has changed slightly since 2013 with largest  left adrenal nodule currently measuring 2.6 x 2.5 x 1.9 cm versus on 2013 exam 2.4 x 2.3 x 1.9 cm Stomach/Bowel: Fluid and gas-filled prominent size small bowel without point of transition noted. Fluid-filled featureless colon. Minimal amount of  fluid adjacent to small bowel loops and minimal hazy infiltration of fat planes surrounding the descending colon. No pneumatosis or free intraperitoneal air. Findings may reflect changes of enterocolitis of indeterminate etiology whether ischemic, inflammatory or infectious. No pneumatosis or free intraperitoneal air. 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 Dissection extends into iliac arteries. Inferior mesenteric artery may fill in a retrograde fashion. Appearance unchanged from recent CT angiogram. Scattered normal size lymph nodes. Reproductive: No worrisome abnormality. Foley catheter with decompressed urinary bladder. Other: No bowel containing hernia. Musculoskeletal: No acute osseous abnormality. IMPRESSION: Fluid and gas-filled prominent size small bowel without worrisome point of transition. Fluid-filled featureless colon. Minimal amount of fluid adjacent to small bowel loops and minimal hazy infiltration of fat planes surrounding the descending colon. No pneumatosis or free intraperitoneal air. Findings may reflect changes of enterocolitis of indeterminate etiology whether ischemic, inflammatory or infectious. No pneumatosis or free intraperitoneal air. 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 Infarction majority of the left kidney. Adrenal gland nodularity greater on left stable since recent exams and minimally changed since 2013 as detailed above. Electronically Signed   By: Genia Del M.D.   On: 07/07/2016 14:22   Dg Chest Port 1 View  Result Date: 07/13/2016 CLINICAL  DATA:  Follow-up examination for aortic dissection. EXAM: PORTABLE CHEST 1 VIEW COMPARISON:  Prior radiograph from 07/08/2016. FINDINGS: Enteric tube courses in the the abdomen. Right-sided PICC catheter remains in place with tip overlying the cavoatrial junction. Stable cardiomegaly. Mediastinal silhouette unchanged and within normal limits. Lungs hypoinflated. Persistent patchy and linear left basilar opacity, which may reflect atelectasis or infiltrate. Previously seen left pleural effusion has largely resolved. No new focal airspace opacity. No pulmonary edema. No pneumothorax. No acute osseus abnormality. IMPRESSION: 1. Interval decrease with near resolution of left pleural effusion. Persistent patchy left basilar opacity also improved, and may reflect residual atelectasis and/or infiltrate. 2. No other new cardiopulmonary finding. Electronically Signed   By: Jeannine Boga M.D.   On: 07/13/2016 06:42   Dg Chest Port 1 View  Result Date: 07/08/2016 CLINICAL DATA:  Shortness of Breath EXAM: PORTABLE CHEST 1 VIEW COMPARISON:  July 04, 2016 FINDINGS: Central catheter tip is in the superior vena cava, stable. Nasogastric tube tip and side port are in the stomach. No pneumothorax. There is persistent airspace consolidation in the left lower lobe with small left pleural effusion. Right lung is clear. Heart is upper normal in size with pulmonary vascularity within normal limits. No adenopathy. No evident bone lesions. IMPRESSION: Tube and catheter positions as described without pneumothorax. Persistent left lower lobe consolidation with small left pleural effusion. No new parenchymal lung opacity. Stable cardiac silhouette. Electronically Signed   By: Lowella Grip III M.D.   On: 07/08/2016 08:00   Dg Chest Port 1 View  Result Date: 07/04/2016 CLINICAL DATA:  Followup shortness of breath.  Aortic dissection. EXAM: PORTABLE CHEST 1 VIEW COMPARISON:  07/01/2016 FINDINGS: Nasogastric tube enters the  stomach. Right arm PICC tip is in the SVC above right atrium. Less pulmonary edema. Persistent left effusion. Persistent volume loss in both lower lobes left worse than right. Chronic prominence of the aortic shadow consistent with the history of dissection. IMPRESSION: Less pulmonary edema. Persistent left effusion. Persistent atelectasis in both lower lobes left more than right. Persistent prominent aortic/mediastinal shadow. Electronically Signed   By:  Nelson Chimes M.D.   On: 07/04/2016 08:11   Dg Chest Port 1 View  Result Date: 07/01/2016 CLINICAL DATA:  Shortness of breath common pneumonia, thoracic aortic dissection EXAM: PORTABLE CHEST 1 VIEW COMPARISON:  Portable chest x-ray of Jun 30, 2016 FINDINGS: The lungs are adequately inflated. The pulmonary interstitial markings are less prominent. The retrocardiac region remains dense and there is remains partial obscuration of the left hemidiaphragm. The heart is top-normal in size. The mediastinum is normal in width. The esophagogastric tube tip projects below the inferior margin of the image. The right PICC line tip projects over the midportion of the SVC. IMPRESSION: Slight interval improvement in the appearance of the pulmonary interstitium suggests decreasing interstitial edema. There is persistent left lower lobe atelectasis or pneumonia. Electronically Signed   By: David  Martinique M.D.   On: 07/01/2016 07:35   Dg Chest Port 1 View  Result Date: 06/30/2016 CLINICAL DATA:  Breath, pneumonia, aortic aneurysm, GERD, hypertension, diabetes mellitus EXAM: PORTABLE CHEST 1 VIEW COMPARISON:  Portable exam 0633 hours compared to 06/29/2016 FINDINGS: Nasogastric tube coiled in proximal stomach. RIGHT arm PICC line tip projects over SVC above cavoatrial junction. Upper normal heart size. Mediastinal contours normal. Mild pulmonary vascular congestion. Persistent diffuse BILATERAL infiltrates. Atelectasis versus consolidation LEFT lower lobe. No pneumothorax.  IMPRESSION: Persistent mild diffuse interstitial infiltrates with atelectasis versus consolidation in LEFT lower lobe. Electronically Signed   By: Lavonia Dana M.D.   On: 06/30/2016 06:58   Dg Chest Port 1 View  Result Date: 06/29/2016 CLINICAL DATA:  Shortness of Breath EXAM: PORTABLE CHEST 1 VIEW COMPARISON:  06/28/2016 FINDINGS: Nasogastric catheter is noted coiled within the stomach. Right-sided PICC line is seen in the distal superior vena cava. Cardiac shadow is mildly enlarged but stable. Bilateral vascular congestion and edema is seen stable from prior exam. Some atelectasis remains in the left retrocardiac region. IMPRESSION: No significant interval change from the prior exam. Electronically Signed   By: Inez Catalina M.D.   On: 06/29/2016 07:36   Dg Chest Port 1 View  Result Date: 06/28/2016 CLINICAL DATA:  Aortic dissection. EXAM: PORTABLE CHEST 1 VIEW COMPARISON:  06/27/2016. FINDINGS: The heart is enlarged. There is moderate vascular congestion. Prominence of the aortic contour reflecting the type B dissection. LEFT lower lobe atelectasis with effusion. IMPRESSION: Cardiomegaly. Aortic dissection. Vascular congestion. LEFT lower lobe atelectasis with effusion. Electronically Signed   By: Staci Righter M.D.   On: 06/28/2016 07:13   Dg Chest Port 1 View  Result Date: 06/27/2016 CLINICAL DATA:  ARDS. EXAM: PORTABLE CHEST 1 VIEW COMPARISON:  Jun 26, 2016 FINDINGS: The right PICC line is stable. The NG tube terminates below today's study. Diffuse bilateral primarily interstitial opacities are mildly more homogeneous in less patchy in appearance. More focal opacity in the left base is stable. No other changes. IMPRESSION: 1. Diffuse bilateral primarily interstitial opacities suggesting edema remain. More focal opacity in the left base is stable to mildly improved. Electronically Signed   By: Dorise Bullion III M.D   On: 06/27/2016 07:21   Dg Chest Port 1 View  Result Date: 06/26/2016 CLINICAL  DATA:  Shortness of breath. EXAM: PORTABLE CHEST 1 VIEW COMPARISON:  Radiograph of Jun 25, 2016. FINDINGS: Stable cardiomegaly. Increased bilateral diffuse interstitial and basilar opacities are noted concerning for worsening edema. No pneumothorax is noted. Mild left pleural effusion is noted with associated atelectasis or infiltrate. Right-sided PICC line is unchanged in position. Nasogastric tube is unchanged in position. Bony  thorax is unremarkable. IMPRESSION: Increased bilateral diffuse interstitial densities consistent with edema. Mild left pleural effusion is noted with associated atelectasis or infiltrate. Electronically Signed   By: Marijo Conception, M.D.   On: 06/26/2016 07:40   Dg Chest Port 1 View  Result Date: 06/25/2016 CLINICAL DATA:  Shortness of Breath EXAM: PORTABLE CHEST 1 VIEW COMPARISON:  06/24/2016 FINDINGS: Cardiac shadow is prominent but stable. Right-sided PICC line and nasogastric catheter are again seen and stable. Bibasilar atelectatic changes are noted worse on the left than the right and increased from the prior exam. Small left pleural effusion is noted as well. Mild vascular congestion is seen. IMPRESSION: Increasing bibasilar infiltrates left greater than right. Mild vascular congestion is noted. The Electronically Signed   By: Inez Catalina M.D.   On: 06/25/2016 07:54   Dg Chest Port 1 View  Result Date: 06/24/2016 CLINICAL DATA:  Shortness of Breath EXAM: PORTABLE CHEST 1 VIEW COMPARISON:  06/23/2016 FINDINGS: Right-sided PICC line is again noted at the cavoatrial junction. Nasogastric catheter is noted in satisfactory position through the overall inspiratory effort is decreased from the prior exam. Mild left basilar atelectasis remains. No other focal abnormality is noted. IMPRESSION: Stable left basilar atelectasis. Electronically Signed   By: Inez Catalina M.D.   On: 06/24/2016 08:07   Dg Chest Port 1 View  Result Date: 06/23/2016 CLINICAL DATA:  61 year old female  with shortness of breath and abdominal pain. 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 Being treated conservatively at this time, including with bowel rest. EXAM: PORTABLE CHEST 1 VIEW COMPARISON:  Chest radiographs 06/22/2016 and earlier. CTA 06/20/2016 FINDINGS: Portable AP semi upright view at 0754 hours. Stable enteric tube which courses to the abdomen. Stable right PICC line. Mildly larger lung volumes and decreased streaky bibasilar opacity. Mild residual. No pneumothorax or pulmonary edema. No pleural effusion or consolidation. Stable cardiac size and mediastinal contours. IMPRESSION: 1.  Stable lines and tubes. 2. Mildly improved lung volumes and regressed bibasilar opacity favored to be atelectasis. Electronically Signed   By: Genevie Ann M.D.   On: 06/23/2016 08:19   Dg Chest Port 1 View  Result Date: 06/22/2016 CLINICAL DATA:  61 year old female with history of abdominal aortic aneurysm. Shortness of breath and vomiting. EXAM: PORTABLE CHEST 1 VIEW COMPARISON:  Chest x-ray 06/21/2016. FINDINGS: There is a right upper extremity PICC with tip terminating in the superior cavoatrial junction. A nasogastric tube is seen extending into the stomach, however, the tip of the nasogastric tube extends below the lower margin of the image. Lung volumes are low. Linear bibasilar opacities favored to reflect areas of subsegmental atelectasis, however, underlying airspace consolidation from infection or aspiration is not excluded. No pleural effusions. No evidence of pulmonary edema. Heart size is normal. Upper mediastinal contours are within normal limits. IMPRESSION: 1. Support apparatus, as above. 2. Low lung volumes with bibasilar areas of atelectasis and/or airspace consolidation. Electronically Signed   By: Vinnie Langton M.D.   On: 06/22/2016 07:30   Dg Chest Port 1 View  Result Date:  06/21/2016 CLINICAL DATA:  Aortic dissection EXAM: PORTABLE CHEST 1 VIEW COMPARISON:  06/20/2016 FINDINGS: Bibasilar atelectasis. Heart is normal size. No effusions or acute bony abnormality. IMPRESSION: Bibasilar atelectasis. Electronically Signed   By: Rolm Baptise M.D.   On: 06/21/2016 07:18   Dg Chest Port 1 View  Result Date: 06/20/2016 CLINICAL DATA:  Hypoxia, unresponsive  EXAM: PORTABLE CHEST 1 VIEW COMPARISON:  CTA chest dated 06/20/2016 FINDINGS: Lungs are essentially clear. No focal consolidation. No pleural effusion or pneumothorax. The heart is top-normal in size. IMPRESSION: No evidence of acute cardiopulmonary disease. Electronically Signed   By: Julian Hy M.D.   On: 06/20/2016 17:01   Dg Chest Port 1 View  Result Date: 06/20/2016 CLINICAL DATA:  Sudden onset sharp substernal chest pain that radiates to the back. Nausea, dizziness and shortness of breath. EXAM: PORTABLE CHEST 1 VIEW COMPARISON:  None. FINDINGS: Trachea is midline. Heart size is accentuated by AP supine technique. Probable mild scarring in the right middle lobe and lingula. Lungs are otherwise clear. No pleural fluid. IMPRESSION: No acute findings. Electronically Signed   By: Lorin Picket M.D.   On: 06/20/2016 09:56   Dg Abd Portable 1v  Result Date: 07/13/2016 CLINICAL DATA:  Initial evaluation for aortic dissection, abdominal distension. EXAM: PORTABLE ABDOMEN - 1 VIEW COMPARISON:  Prior radiograph from 07/11/2016. FINDINGS: Enteric to is scored within the stomach. Cholecystectomy clips noted. There are persistent scattered gas-filled loops of bowel within the abdomen. Overall volume gas is slightly decreased from prior, although the bowel gas pattern has not significantly changed. IMPRESSION: 1. Persistent gas-filled loops of small and large bowel within the abdomen, suggesting small and large bowel ileus. Overall, degree of gaseous distention is slightly improved from previous. 2. NG tube coiled within the  stomach. Electronically Signed   By: Jeannine Boga M.D.   On: 07/13/2016 06:39   Dg Abd Portable 1v  Result Date: 07/11/2016 CLINICAL DATA:  61 year old female with type B aortic dissection, left renal infarcts, and bowel ileus. EXAM: PORTABLE ABDOMEN - 1 VIEW COMPARISON:  07/10/2016, CT Abdomen and Pelvis 07/07/2016, and earlier FINDINGS: KUB view of the abdomen at 0528 hours. NG tube looped in the stomach. Stable cholecystectomy clips. Continued gas-filled prominent large and small bowel loops throughout the abdomen. Mildly increased volume of bowel gas but otherwise the bowel gas pattern has not changed since 07/07/2016 where air-fluid levels were demonstrated by CT in both large and small bowel. IMPRESSION: 1. Stable bowel gas pattern since 07/07/2016 suggesting small and large bowel ileus. 2. Stable NG tube in the stomach. Electronically Signed   By: Genevie Ann M.D.   On: 07/11/2016 07:29   Dg Abd Portable 1v  Result Date: 07/10/2016 CLINICAL DATA:  Ileus.  Aortic dissection. EXAM: PORTABLE ABDOMEN - 1 VIEW COMPARISON:  CT scan July 07, 2016.  KUB July 08, 2016 FINDINGS: Persistent small bowel dilatation measuring up to 4.5 cm, mildly more prominent in the interval. The transverse colon is also air-filled but nondilated. The descending colon is relatively decompressed. An NG tube terminates in the stomach. No free air, portal venous gas, or pneumatosis on supine imaging. IMPRESSION: 1. The dilated small bowel loops are mildly more prominent in the interval. Electronically Signed   By: Dorise Bullion III M.D   On: 07/10/2016 07:28   Dg Abd Portable 1v  Result Date: 07/08/2016 CLINICAL DATA:  Abdominal distention EXAM: PORTABLE ABDOMEN - 1 VIEW COMPARISON:  July 07, 2016 FINDINGS: There may be slightly less bowel dilatation compared to 1 day prior. No air-fluid levels evident. No free air. Nasogastric tube tip and side port remain in stomach. IMPRESSION: Probable ileus with marginally less bowel  dilatation compared to 1 day prior. No free air. Nasogastric tube tip and side port in stomach. Electronically Signed   By: Lowella Grip III M.D.   On:  07/08/2016 08:00   Dg Abd Portable 1v  Result Date: 07/07/2016 CLINICAL DATA:  Feeding tube placement. EXAM: PORTABLE ABDOMEN - 1 VIEW COMPARISON:  07/06/2016 FINDINGS: Interval placement of enteric tube which enters the stomach where it is coiled once and has tip and side-port over the gastric fundus just below the left hemidiaphragm. Air-filled loops of large and small bowel with dilated small bowel loop in the left abdomen measuring 5.7 cm in diameter as findings are not significantly changed. Remainder of the exam is unchanged. IMPRESSION: Persistent air-filled dilated small bowel with air throughout the colon. Nasogastric tube coiled once within the stomach with tip and side-port over the gastric fundus just below the left hemidiaphragm. Electronically Signed   By: Marin Olp M.D.   On: 07/07/2016 19:16   Dg Abd Portable 1v  Result Date: 07/06/2016 CLINICAL DATA:  Multiple episodes of vomiting, abdominal pain and distention. EXAM: PORTABLE ABDOMEN - 1 VIEW COMPARISON:  07/04/2016 FINDINGS: There is persistent dilation and wall edema of small bowel loops, with featureless appearance of some of the visualized small bowel loops. No radiographic evidence of organomegaly or free gas on this limited supine radiograph. IMPRESSION: Persistent dilation/wall edema and featureless appearance of small bowel loops in a nonspecific pattern. These findings may be seen with ileus, incomplete small bowel obstruction or enteritis. Ischemic enteritis continues to be of consideration. Electronically Signed   By: Fidela Salisbury M.D.   On: 07/06/2016 16:14   Dg Abd Portable 1v  Result Date: 07/04/2016 CLINICAL DATA:  Aortic dissection. EXAM: PORTABLE ABDOMEN - 1 VIEW COMPARISON:  07/02/2016 FINDINGS: Nasogastric tube tip in the region of the antrum/pylorus.  Persistent group of dilated small bowel loops in the left mid abdomen with possible bowel edema. Possibility of bowel ischemia certainly does exist. Similar appearance to the study of 07/02/2016. IMPRESSION: Nasogastric tube in place. Otherwise similar appearance with abnormal small bowel pattern in the left central abdomen which could go along with partial small bowel obstruction or a focal enteritis including ischemic. Electronically Signed   By: Nelson Chimes M.D.   On: 07/04/2016 08:13   Dg Abd Portable 1v  Result Date: 07/02/2016 CLINICAL DATA:  Nasogastric tube removed.  Acute abdominal pain. EXAM: PORTABLE ABDOMEN - 1 VIEW COMPARISON:  06/27/2016 FINDINGS: Nasogastric tube is been removed. There is a group of dilated fluid and air-filled loops of small intestine in the left abdomen with apparent wall thickening. This could be due to partial obstruction or enteritis, including ischemic bowel insult. IMPRESSION: Nasogastric tube removed. Abnormal dilated small intestine in the left central abdomen. Differential diagnosis partial small bowel obstruction versus enteritis, including ischemic. Electronically Signed   By: Nelson Chimes M.D.   On: 07/02/2016 07:37   Dg Abd Portable 1v  Result Date: 06/27/2016 CLINICAL DATA:  Ileus. EXAM: PORTABLE ABDOMEN - 1 VIEW COMPARISON:  Jun 26, 2016 FINDINGS: The NG tube terminates within the stomach. The bowel gas pattern is unremarkable on today's study. No other acute abnormalities. IMPRESSION: The NG tube terminates within the stomach. The bowel gas pattern is unremarkable. Electronically Signed   By: Dorise Bullion III M.D   On: 06/27/2016 07:19   Dg Abd Portable 1v  Result Date: 06/26/2016 CLINICAL DATA:  Shortness of breath, abdominal pain EXAM: PORTABLE ABDOMEN - 1 VIEW COMPARISON:  06/25/2016 FINDINGS: NG tube tip is in the distal stomach. Prior cholecystectomy. Nonobstructive bowel gas pattern. No free air organomegaly. IMPRESSION: NG tube tip in the distal  stomach.  No acute  findings. Electronically Signed   By: Rolm Baptise M.D.   On: 06/26/2016 07:36   Dg Abd Portable 1v  Result Date: 06/25/2016 CLINICAL DATA:  Shortness of breath.  Abdominal pain. EXAM: PORTABLE ABDOMEN - 1 VIEW COMPARISON:  Jun 24, 2016 FINDINGS: The distal tip of the NG tube is near the gastric antrum. Mild opacity in left lung base will be better assessed on today's chest x-ray. No free air or portal venous gas identified although evaluation is limited on supine imaging. No bowel obstruction. IMPRESSION: No interval change or acute abnormality in the abdomen. Electronically Signed   By: Dorise Bullion III M.D   On: 06/25/2016 07:54   Dg Abd Portable 1v  Result Date: 06/24/2016 CLINICAL DATA:  Abdominal distention. EXAM: PORTABLE ABDOMEN - 1 VIEW COMPARISON:  06/23/2016 . FINDINGS: NG tube noted in stable position with tip in the stomach . Surgical clips right upper quadrant. Several nonspecific loops of air-filled small bowel again noted. Colonic gas pattern is normal. No free air. IMPRESSION: 1.  NG tube in stable position. 2. Several nonspecific loops of air-filled small bowel again noted. No evidence of progressive bowel distention. Colonic gas pattern normal. No free air. Electronically Signed   By: Marcello Moores  Register   On: 06/24/2016 08:06   Dg Abd Portable 1v  Result Date: 06/23/2016 CLINICAL DATA:  61 year old female with shortness of breath and abdominal pain. 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 Being treated conservatively at this time, including with bowel rest. EXAM: PORTABLE ABDOMEN - 1 VIEW COMPARISON:  Abdominal radiographs 06/22/2016. CTA 06/20/2016, and earlier. FINDINGS: Portable AP supine view at 0759 hours. Stable NG tube, side hole to level of the gastric body and tip at the level of the gastric antrum. Stable cholecystectomy clips. Stable bowel  gas pattern with several gas containing nondilated small and large bowel loops. No definite pneumoperitoneum on this supine view. Abdominal and pelvic visceral contours appear stable. No acute osseous abnormality identified. IMPRESSION: 1. Stable NG tube position. 2. Stable, nonobstructed bowel-gas pattern. Electronically Signed   By: Genevie Ann M.D.   On: 06/23/2016 08:18   Dg Abd Portable 1v  Result Date: 06/22/2016 CLINICAL DATA:  Abdominal pain for few days with nausea and vomiting. EXAM: PORTABLE ABDOMEN - 1 VIEW COMPARISON:  06/22/2016 abdominal radiograph. FINDINGS: Enteric tube loops in the gastric fundus and terminates in the body of the stomach, without appreciable kink. Top-normal caliber small bowel loops throughout the abdomen. Minimal colonic stool. No evidence of pneumatosis or pneumoperitoneum. No radiopaque urolithiasis. Cholecystectomy clips are seen in the right upper quadrant of the abdomen. Patchy opacities at the lung bases. IMPRESSION: 1. Enteric tube loops in the gastric fundus and terminates in the body of the stomach without appreciable kink. 2. Top-normal caliber small bowel loops, unchanged. 3. Patchy bibasilar lung opacities, correlate with chest radiograph. Electronically Signed   By: Ilona Sorrel M.D.   On: 06/22/2016 16:58   Dg Abd Portable 1v  Result Date: 06/22/2016 CLINICAL DATA:  Vomiting.  Shortness of breath.  AAA . EXAM: PORTABLE ABDOMEN - 1 VIEW COMPARISON:  06/21/2016. FINDINGS: Surgical clips right upper quadrant. NG tube noted with tip over the distal stomach. No bowel distention. No acute bony abnormality identified. Mild basilar atelectasis. IMPRESSION: NG tube noted with its tip over the distal stomach. No bowel distention or acute intra-abdominal abnormality identified. Electronically Signed   By: Marcello Moores  Register   On: 06/22/2016 07:30   Dg Abd Portable 1v  Result Date: 06/21/2016 CLINICAL DATA:  Feeding tube placement EXAM: PORTABLE ABDOMEN - 1 VIEW  COMPARISON:  06/21/2016 FINDINGS: NG tube coils in the fundus of the stomach with the tip in the distal stomach. Decompression of the stomach. IMPRESSION: NG tube tip in the distal stomach. Electronically Signed   By: Rolm Baptise M.D.   On: 06/21/2016 15:46   Dg Abd Portable 1v  Result Date: 06/21/2016 CLINICAL DATA:  Ileus  Vomiting that started this AM per patient EXAM: PORTABLE ABDOMEN - 1 VIEW COMPARISON:  06/20/2016 FINDINGS: Gaseous distention of the stomach. Paucity of small bowel and colonic gas. Cholecystectomy clips. Linear scarring/ atelectasis in the lung bases. IMPRESSION: 1. Gaseous distention of the stomach. Electronically Signed   By: Lucrezia Europe M.D.   On: 06/21/2016 11:54   Dg Abd Portable 1v  Result Date: 06/20/2016 CLINICAL DATA:  Aortic dissection EXAM: PORTABLE ABDOMEN - 1 VIEW COMPARISON:  CT abdomen/ pelvis dated 06/20/2016 FINDINGS: Nonobstructive bowel gas pattern. Visualized osseous structures are within normal limits. IMPRESSION: Unremarkable abdominal radiograph. Electronically Signed   By: Julian Hy M.D.   On: 06/20/2016 17:02   Ct Angio Chest/abd/pel For Dissection W And/or W/wo  Result Date: 06/27/2016 CLINICAL DATA:  61 year old female with a history of type B dissection EXAM: CT ANGIOGRAPHY CHEST, ABDOMEN AND PELVIS TECHNIQUE: Multidetector CT imaging through the chest, abdomen and pelvis was performed using the standard protocol during bolus administration of intravenous contrast. Multiplanar reconstructed images and MIPs were obtained and reviewed to evaluate the vascular anatomy. CONTRAST:  100 cc Isovue 370 COMPARISON:  CT 06/20/2016 FINDINGS: CTA CHEST FINDINGS Cardiovascular: Heart: Heart size unchanged. No pericardial fluid/ thickening. No significant coronary calcifications. Aorta: Re- demonstration of type B dissection with the entry tear appearing to originate just beyond the origin of the left subclavian artery. Branch vessels remain patent without  extension of the dissection flap into the branch vessels. Caliber and contour of the ascending aorta unremarkable without evidence of retrograde extension. Greatest diameter of the ascending aorta 2.9 cm. Inflammatory changes surrounding the distal aortic arch in the proximal descending aorta. Greatest diameter of the distal aortic arch measures approximately 3.4 cm. Greatest diameter on the comparison CT approximately 3.0 cm. No aneurysm of the descending thoracic aorta with the greatest diameter of the false lumen measuring 2.7 cm. True lumen is compressed. There is a fenestration in the distal true lumen at the level of the diaphragm just above the aortic hiatus. Pulmonary arteries: No lobar, segmental, or proximal subsegmental filling defects. Mediastinum/Nodes: No mediastinal hemorrhage. Mediastinal lymph nodes are present. Gastric tube within the esophagus. Lungs/Pleura: Ground-glass opacities developing through the the bilateral lungs. Developing interlobular septal thickening. Atelectasis of the medial segment left lower lobe. Small low-density left pleural effusion trace right-sided pleural effusion and associated atelectasis. No pneumothorax. Right upper extremity PICC appears to terminate superior vena cava. Review of the MIP images confirms the above findings. CTA ABDOMEN AND PELVIS FINDINGS VASCULAR Aorta: Re- demonstration of dissection flap of the abdominal aorta. No aneurysm.  No periaortic fluid of the abdomen. The true lumen is compressed throughout the abdomen. True lumen contributes to celiac artery origin, superior mesenteric artery origin, and also continues across the origin of the inferior mesenteric artery. The false lumen contributes to perfusion of the right renal artery. The lateral margin of the dissection flap involves the origin of 2 left renal arteries both superior and inferior. True  lumen is decompressed in the inferior aorta extending into the bilateral iliac arteries. The  bilateral common iliac arteries appear perfused from the false lumen bilaterally. False lumen extends in the bilateral external iliac artery. Likely re- entry tear of distal external iliac arteries bilaterally, on the right image 173, on the left image 171. Dissection flap extends into the bilateral hypogastric arteries which are partially patent with decreased flow. Celiac: Origin of the celiac artery originates from the true lumen which is decompressed. Celiac artery remains patent at this time including the branch vessels. Typical branch pattern of splenic artery, left gastric artery, common hepatic artery. SMA: Superior mesenteric artery origin originates from the true lumen which is compressed. SMA remains patent at this time. Renals: Right renal artery originates from the false lumen. Right renal artery is perfusing at this time with uniform perfusion of the right kidney. There are 2 left renal arteries, superior and inferior. Both arteries originate near the margin of the dissection flap. The superior left renal artery is partially filling, at the in flexion point of the dissection flap, perfusing superior kidney. The inferior left renal artery appears thrombosed, new from the comparison with enlarging left renal infarction, predominantly of the lower pole. IMA: Origin of the inferior mesenteric artery is thrombosed given the collapsed true lumen at this level. There is re- constitution of the distal inferior mesenteric artery via collateral flow. Right lower extremity: False lumen perfuses the right common iliac artery with collapse of the true lumen. Dissection flap extends into the hypogastric artery, with the distal pelvic branch is partially opacifying. External iliac artery is perfusing from the false lumen, with unremarkable appearance of the distal external iliac artery, common femoral artery, profunda femoris, SFA. There is the appearance of a re- entry tear in the mid right external iliac artery.  Left lower extremity: False lumen perfuses the left common iliac artery with collapse of the true lumen. Dissection extends into the hypogastric artery which is partially perfused with opacification of pelvic vessels. Distal external iliac artery an the common femoral artery unremarkable, with patent proximal femoral vessels. There is the appearance of a re- entry tear in the mid left external iliac artery. Veins: Unremarkable appearance of the venous system. Review of the MIP images confirms the above findings. NON-VASCULAR Hepatobiliary: Unremarkable appearance of the liver. Cholecystectomy Pancreas: Unremarkable appearance of the pancreas. No pericholecystic fluid or inflammatory changes. Unremarkable ductal system. Spleen: Unremarkable. Adrenals/Urinary Tract: Right adrenal gland unremarkable. Nodule of the left adrenal gland which measures 2.7 cm. Right: Right-sided kidney perfuses uniformly with no hydronephrosis. Left: Progressive left renal infarct with small portion of the superior kidney perfusing. The remainder of the left kidney is hypoperfused, progressed from the comparison. No hydronephrosis. Urinary catheter within the urinary bladder. Stomach/Bowel: Unremarkable appearance of stomach with gastric tube in place. Small bowel borderline dilated and fluid-filled. No transition point. The wall of the small bowel relatively uniformly thin and enhancing, although the study is timed for the arterial phase. No focal wall thickening. No abnormally distended colon. No transition point. Fluid filled colon. No focal wall thickening or pericolonic inflammatory changes. Lymphatic: Multiple lymph nodes in the para-aortic nodal station, none of which are enlarged. Mesenteric: No free fluid or air. No adenopathy. Reproductive: Hysterectomy Other: No hernia. Musculoskeletal: No displaced fracture. Degenerative changes of the spine. 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 Only 1 possible fenestration is identified, in the distal thoracic aorta  above the aortic hiatus. There is enlarging diameter of the distal aortic arch with associated inflammatory changes, measuring approximately 3.5 on today's study compared to approximately 3.1 cm previously. The appearance is concerning for progression/expansion of intramural hematoma. Progressing left renal infarction, with near complete thrombosis of superior and inferior renal arteries, both of which originate at the margin of the dissection flap. Bilateral common iliac arteries appear to be perfused from the false lumen, with complete collapse of true lumen proximally. There does appear to be re- entry to the true lumen in the mid external iliac artery bilaterally, as there is unremarkable appearance of the proximal femoral vasculature including bilateral common iliac arteries. The 3 mesenteric vessels originate from the small true lumen, which is progressively compressed. Celiac artery and superior mesenteric artery remain patent, with occlusion at the origin of the inferior mesenteric artery. Three vessel arch, with all 3 branches remain patent. No evidence of extension of the dissection flap into the branch vessels. These preliminary results were discussed by telephone at the time of interpretation on 06/27/2016 at 12:41 pm with Dr. Curt Jews. Borderline dilated small bowel without transition point or obstruction. Findings may represent ileus and/or nonspecific enteritis. No focal wall thickening to suggest Akin Yi ischemia, although the timing of this CTA is specific for arterial evaluation and not the bowel tissues. If ongoing concern for bowel ischemia, would repeat abd/pelvis contrast enhanced-CT portal venous phase. Similar appearance of mixed geographic ground-glass opacities of the bilateral lungs with mild interlobular septal thickening. Again, differential diagnosis includes pulmonary edema,  ARDS, atypical infection. Signed, Dulcy Fanny. Earleen Newport, DO [facility] Electronically Signed   By: Corrie Mckusick D.O.   On: 06/27/2016 13:18   Ct Angio Chest/abd/pel For Dissection W And/or W/wo  Result Date: 06/20/2016 CLINICAL DATA:  Substernal chest pain radiating to back EXAM: CT ANGIOGRAPHY CHEST, ABDOMEN AND PELVIS TECHNIQUE: Multidetector CT imaging through the chest, abdomen and pelvis was performed using the standard protocol during bolus administration of intravenous contrast. Multiplanar reconstructed images and MIPs were obtained and reviewed to evaluate the vascular anatomy. CONTRAST:  100 cc Isovue 370 IV COMPARISON:  CT abdomen and pelvis 10/27/2011 FINDINGS: CTA CHEST FINDINGS Cardiovascular: There is the type B aortic dissection beginning in the distal aortic arch just beyond the origin of the great vessels. The true lumen is compressed by the false lumen. No aneurysm. No pulmonary embolus. Heart is normal size. Mediastinum/Nodes: No mediastinal, hilar, or axillary adenopathy. Lungs/Pleura: Atelectasis or scarring in the lingula. Lungs otherwise clear. No effusions. Musculoskeletal: No acute bony abnormality. Review of the MIP images confirms the above findings. CTA ABDOMEN AND PELVIS FINDINGS VASCULAR Aorta: Dissection continues throughout the abdominal aorta with the celiac artery and superior mesenteric artery arising from the true lumen anteriorly. The dissection may extend into the left renal artery where there is only a small amount of blood flow noted in the proximal and mid left renal artery. Right renal artery appears to arise from the false lumen and is patent. The the true lumen appears thrombosed below the origin of the superior mesenteric artery. Inferior mesenteric artery arises from the thrombosed true lumen and appears occluded proximally, reconstitutes several cm from the origin. Celiac: Patent. SMA: Patent. Renals: As above.  IMA: As above. Inflow: To the dissection continues into both common iliac arteries with thrombosed true lumens. The dissection appears to terminate at approximately the level of the common iliac bifurcation bilaterally. Veins: Grossly patent and unremarkable. Review of  the MIP images confirms the above findings. NON-VASCULAR Hepatobiliary: Mild diffuse fatty infiltration. Prior cholecystectomy. Pancreas: No focal abnormality or ductal dilatation. Spleen: No focal abnormality.  Normal size. Adrenals/Urinary Tract: Nodules in the left adrenal gland are low-density on the precontrast imaging compatible with small adenomas. There are areas of non perfusion noted in the mid and lower poles of the left kidney compatible with infarction, likely related to the involvement of the left renal artery by dissection. Urinary bladder unremarkable. Stomach/Bowel: Stomach, large and small bowel grossly unremarkable. Lymphatic: No adenopathy. Reproductive: Prior hysterectomy.  No adnexal masses. Other: No free fluid or free air. Musculoskeletal: No acute bony abnormality. Review of the MIP images confirms the above findings. 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 This involves the descending thoracic aorta, abdominal aorta and iliac vessels. The true lumen is compressed by the larger false lumen, and is thrombosed below the SMA/renal artery origins. The dissection appears to extend into the left renal artery with partial occlusion and areas of infarct in the mid and lower pole of the left kidney. Fatty infiltration of the liver. Critical Value/emergent results were called by telephone at the time of interpretation on 06/20/2016 at 10:13 am to Dr. Fredia Sorrow , who verbally acknowledged these results. Electronically Signed   By: Rolm Baptise M.D.   On: 06/20/2016 10:15   Anti-infectives: Anti-infectives    Start     Dose/Rate Route Frequency Ordered Stop    06/26/16 0830  meropenem (MERREM) 1 g in sodium chloride 0.9 % 100 mL IVPB  Status:  Discontinued     1 g 200 mL/hr over 30 Minutes Intravenous Every 8 hours 06/26/16 0810 07/06/16 0804   06/22/16 1800  clindamycin (CLEOCIN) IVPB 600 mg  Status:  Discontinued     600 mg 100 mL/hr over 30 Minutes Intravenous Every 8 hours 06/22/16 1723 06/26/16 0810   06/22/16 1000  azithromycin (ZITHROMAX) 250 mg in dextrose 5 % 125 mL IVPB  Status:  Discontinued     250 mg 125 mL/hr over 60 Minutes Intravenous Every 24 hours 06/22/16 0826 06/27/16 1019   06/22/16 0900  levofloxacin (LEVAQUIN) IVPB 500 mg  Status:  Discontinued     500 mg 100 mL/hr over 60 Minutes Intravenous Every 24 hours 06/22/16 0759 06/22/16 0824      Assessment/Plan: s/p * No surgery found * Currently tolerating NG output. X-ray from yesterday showed continued small and large bowel ileus with some improvement in gas pattern. Continue support   LOS: 24 days   Haley Graham 07/14/2016, 8:05 AM

## 2016-07-14 NOTE — Progress Notes (Signed)
  Subjective: Walking in hall with PT C/o hip arthritis pain No emesis with NG out BP stable Weaning iv pain meds off  Objective: Vital signs in last 24 hours: Temp:  [98.4 F (36.9 C)-101.1 F (38.4 C)] 98.4 F (36.9 C) (06/12 0700) Pulse Rate:  [92-114] 109 (06/12 1200) Cardiac Rhythm: Sinus tachycardia (06/12 0800) Resp:  [18-29] 27 (06/12 1200) BP: (83-138)/(33-104) 106/57 (06/12 1200) SpO2:  [93 %-98 %] 95 % (06/12 1200) Weight:  [178 lb (80.7 kg)] 178 lb (80.7 kg) (06/12 0630)  Hemodynamic parameters for last 24 hours:  nsr  Intake/Output from previous day: 06/11 0701 - 06/12 0700 In: 3114 [P.O.:525; I.V.:2369; NG/GT:20; IV Piggyback:200] Out: 700 [Urine:700] Intake/Output this shift: Total I/O In: 93 [I.V.:93] Out: -        Exam    General- alert and comfortable   Lungs- clear without rales, wheezes   Cor- regular rate and rhythm, no murmur , gallop   Abdomen- soft, non-tender, mildly distended   Extremities - warm, non-tender, minimal edema   Neuro- oriented, appropriate, no focal weakness   Lab Results:  Recent Labs  07/13/16 0500  WBC 9.8  HGB 8.6*  HCT 26.8*  PLT 122*   BMET:  Recent Labs  07/13/16 0500 07/14/16 0510  NA 134* 131*  K 3.6 3.7  CL 106 104  CO2 21* 21*  GLUCOSE 140* 139*  BUN 31* 32*  CREATININE 1.10* 1.03*  CALCIUM 7.8* 7.6*    PT/INR: No results for input(s): LABPROT, INR in the last 72 hours. ABG    Component Value Date/Time   PHART 7.430 06/28/2016 1615   HCO3 20.7 06/28/2016 1615   TCO2 22 06/28/2016 1615   ACIDBASEDEF 3.0 (H) 06/28/2016 1615   O2SAT 93.0 06/28/2016 1615   CBG (last 3)   Recent Labs  07/13/16 2350 07/14/16 0355 07/14/16 0837  GLUCAP 151* 147* 147*    Assessment/Plan: S/P type B aortic dissection Temp 38.2, urine culture from 4 days ago -- enterococcus Now with  Normal WBC and foley removed since culture taken - will repeat culture and hold therapy    LOS: 24 days    [name] 07/14/2016

## 2016-07-14 NOTE — Progress Notes (Signed)
Subjective/Chief Complaint: NGT removed yesterday. Mild occasional nausea.    Objective: Vital signs in last 24 hours: Temp:  [98.1 F (36.7 C)-101.1 F (38.4 C)] 98.1 F (36.7 C) (06/12 1200) Pulse Rate:  [92-114] 109 (06/12 1200) Resp:  [18-29] 27 (06/12 1200) BP: (83-138)/(33-104) 106/57 (06/12 1200) SpO2:  [93 %-98 %] 95 % (06/12 1200) Weight:  [80.7 kg (178 lb)] 80.7 kg (178 lb) (06/12 0630) Last BM Date: 07/13/16  Intake/Output from previous day: 06/11 0701 - 06/12 0700 In: 3114 [P.O.:525; I.V.:2369; NG/GT:20; IV Piggyback:200] Out: 700 [Urine:700] Intake/Output this shift: Total I/O In: 93 [I.V.:93] Out: -   General appearance: cooperative Resp: breathing comfortably Cardio: regular rate and rhythm GI: soft, mild dist, not sig tender  Lab Results:   Recent Labs  07/13/16 0500  WBC 9.8  HGB 8.6*  HCT 26.8*  PLT 122*   BMET  Recent Labs  07/13/16 0500 07/14/16 0510  NA 134* 131*  K 3.6 3.7  CL 106 104  CO2 21* 21*  GLUCOSE 140* 139*  BUN 31* 32*  CREATININE 1.10* 1.03*  CALCIUM 7.8* 7.6*   PT/INR No results for input(s): LABPROT, INR in the last 72 hours. ABG No results for input(s): PHART, HCO3 in the last 72 hours.  Invalid input(s): PCO2, PO2  Studies/Results: Dg Chest Port 1 View  Result Date: 07/13/2016 CLINICAL DATA:  Follow-up examination for aortic dissection. EXAM: PORTABLE CHEST 1 VIEW COMPARISON:  Prior radiograph from 07/08/2016. FINDINGS: Enteric tube courses in the the abdomen. Right-sided PICC catheter remains in place with tip overlying the cavoatrial junction. Stable cardiomegaly. Mediastinal silhouette unchanged and within normal limits. Lungs hypoinflated. Persistent patchy and linear left basilar opacity, which may reflect atelectasis or infiltrate. Previously seen left pleural effusion has largely resolved. No new focal airspace opacity. No pulmonary edema. No pneumothorax. No acute osseus abnormality. IMPRESSION: 1. Interval  decrease with near resolution of left pleural effusion. Persistent patchy left basilar opacity also improved, and may reflect residual atelectasis and/or infiltrate. 2. No other new cardiopulmonary finding. Electronically Signed   By: Jeannine Boga M.D.   On: 07/13/2016 06:42   Dg Abd Portable 1v  Result Date: 07/13/2016 CLINICAL DATA:  Initial evaluation for aortic dissection, abdominal distension. EXAM: PORTABLE ABDOMEN - 1 VIEW COMPARISON:  Prior radiograph from 07/11/2016. FINDINGS: Enteric to is scored within the stomach. Cholecystectomy clips noted. There are persistent scattered gas-filled loops of bowel within the abdomen. Overall volume gas is slightly decreased from prior, although the bowel gas pattern has not significantly changed. IMPRESSION: 1. Persistent gas-filled loops of small and large bowel within the abdomen, suggesting small and large bowel ileus. Overall, degree of gaseous distention is slightly improved from previous. 2. NG tube coiled within the stomach. Electronically Signed   By: Jeannine Boga M.D.   On: 07/13/2016 06:39    Anti-infectives: Anti-infectives    Start     Dose/Rate Route Frequency Ordered Stop   07/14/16 1115  levofloxacin (LEVAQUIN) tablet 500 mg  Status:  Discontinued     500 mg Oral Daily 07/14/16 1107 07/14/16 1214   06/26/16 0830  meropenem (MERREM) 1 g in sodium chloride 0.9 % 100 mL IVPB  Status:  Discontinued     1 g 200 mL/hr over 30 Minutes Intravenous Every 8 hours 06/26/16 0810 07/06/16 0804   06/22/16 1800  clindamycin (CLEOCIN) IVPB 600 mg  Status:  Discontinued     600 mg 100 mL/hr over 30 Minutes Intravenous Every 8 hours 06/22/16 1723  06/26/16 0810   06/22/16 1000  azithromycin (ZITHROMAX) 250 mg in dextrose 5 % 125 mL IVPB  Status:  Discontinued     250 mg 125 mL/hr over 60 Minutes Intravenous Every 24 hours 06/22/16 0826 06/27/16 1019   06/22/16 0900  levofloxacin (LEVAQUIN) IVPB 500 mg  Status:  Discontinued     500  mg 100 mL/hr over 60 Minutes Intravenous Every 24 hours 06/22/16 0759 06/22/16 0824     Assessment/Plan: Aortic dissection 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

## 2016-07-14 NOTE — Progress Notes (Signed)
CT surgery p.m. Rounds  Stable day walked in hall twice Bowel movements 2 No nausea this p.m. Advance diet per general surgery Blood pressure well controlled

## 2016-07-14 NOTE — Progress Notes (Signed)
Physical Therapy Treatment Patient Details Name: Haley Graham MRN: [id number] DOB: July 27, 1955 Today's Date: 07/14/2016    History of Present Illness Pt adm with type B aortic dissection. Treated non surgically with BP control and pain control. Pt developed pancreatitis and ileus with NG tube placed. PMH - anxiety, arthritis    PT Comments    Patient continues to progress with mobility and overall min guard for mobility. Pt tolerated increased gait distance with rest breaks due to c/o low back/perlvic pain. RR and HR elevated with activity. Pt continues to require max vc for breathing technique with mobility and becomes very anxious. Continue to progress as tolerated with anticipated d/c home with HHPT.    Follow Up Recommendations  Home health PT;Supervision/Assistance - 24 hour     Equipment Recommendations  Rolling walker with 5" wheels    Recommendations for Other Services       Precautions / Restrictions Precautions Precautions: Fall Restrictions Weight Bearing Restrictions: No    Mobility  Bed Mobility Overal bed mobility: Needs Assistance Bed Mobility: Supine to Sit;Sit to Supine     Supine to sit: HOB elevated;Min guard Sit to supine: Min guard   General bed mobility comments: cues for sequencing; increased time and effort; min guard for safety  Transfers Overall transfer level: Needs assistance Equipment used: Rolling walker (2 wheeled) Transfers: Sit to/from Stand Sit to Stand: Min guard         General transfer comment: min guard for safety from EOB, recliner, and BSC; cues for safe hand placement with each trial   Ambulation/Gait Ambulation/Gait assistance: Min guard;+2 safety/equipment (chair follow) Ambulation Distance (Feet):  (approx 250) Assistive device: Rolling walker (2 wheeled) Gait Pattern/deviations: Step-through pattern;Decreased step length - right;Decreased step length - left;Decreased stride length Gait velocity: decr   General  Gait Details: pt is very guarded and anxious with mobility; max cues for breathing technique; multiple seated breaks due to c/o pain in low back/pelvis   Stairs            Wheelchair Mobility    Modified Rankin (Stroke Patients Only)       Balance Overall balance assessment: Needs assistance   Sitting balance-Leahy Scale: Fair     Standing balance support: Bilateral upper extremity supported Standing balance-Leahy Scale: Poor                              Cognition Arousal/Alertness: Awake/alert Behavior During Therapy: Flat affect Overall Cognitive Status: Impaired/Different from baseline Area of Impairment: Following commands;Attention;Problem solving                   Current Attention Level: Sustained   Following Commands: Follows one step commands with increased time;Follows one step commands inconsistently   Awareness: Emergent Problem Solving: Slow processing;Decreased initiation;Requires verbal cues;Requires tactile cues General Comments: pt continues to have episodes of "spacing out" with activity and takes more time to answer questions with mobility; pt is very anxious       Exercises      General Comments General comments (skin integrity, edema, etc.): SpO2 90% or > with 2L O2 via [place], BP WNL, HR up to 130s, and RR up to 30 with mobility      Pertinent Vitals/Pain Pain Assessment: Faces Faces Pain Scale: Hurts even more Pain Location: pelvis with mobility Pain Descriptors / Indicators: Grimacing;Aching Pain Intervention(s): Monitored during session;Repositioned    Home Living  Prior Function            PT Goals (current goals can now be found in the care plan section) Acute Rehab PT Goals PT Goal Formulation: With patient/family Time For Goal Achievement: 07/10/16 Potential to Achieve Goals: Good Progress towards PT goals: Progressing toward goals    Frequency    Min 3X/week      PT  Plan Current plan remains appropriate    Co-evaluation              AM-PAC PT "6 Clicks" Daily Activity  Outcome Measure  Difficulty turning over in bed (including adjusting bedclothes, sheets and blankets)?: A Little Difficulty moving from lying on back to sitting on the side of the bed? : A Lot Difficulty sitting down on and standing up from a chair with arms (e.g., wheelchair, bedside commode, etc,.)?: A Little Help needed moving to and from a bed to chair (including a wheelchair)?: A Little Help needed walking in hospital room?: A Little Help needed climbing 3-5 steps with a railing? : A Lot 6 Click Score: 16    End of Session Equipment Utilized During Treatment: Gait belt Activity Tolerance: Patient tolerated treatment well Patient left: in bed;with call bell/phone within reach Nurse Communication: Mobility status PT Visit Diagnosis: Muscle weakness (generalized) (M62.81);Other abnormalities of gait and mobility (R26.89)     Time: 5945-8592 PT Time Calculation (min) (ACUTE ONLY): 51 min  Charges:  $Gait Training: 23-37 mins $Therapeutic Activity: 8-22 mins                    G Codes:       Earney Navy, PTA Pager: [phone]     Darliss Cheney 07/14/2016, 1:27 PM

## 2016-07-15 LAB — BASIC METABOLIC PANEL
Anion gap: 7 (ref 5–15)
BUN: 31 mg/dL — ABNORMAL HIGH (ref 6–20)
CO2: 18 mmol/L — ABNORMAL LOW (ref 22–32)
Calcium: 7.5 mg/dL — ABNORMAL LOW (ref 8.9–10.3)
Chloride: 101 mmol/L (ref 101–111)
Creatinine, Ser: 0.99 mg/dL (ref 0.44–1.00)
GFR calc Af Amer: >60 mL/min (ref >60)
GFR calc non Af Amer: >60 mL/min (ref >60)
Glucose, Bld: 125 mg/dL — ABNORMAL HIGH (ref 65–99)
Potassium: 4.2 mmol/L (ref 3.5–5.1)
Sodium: 126 mmol/L — ABNORMAL LOW (ref 135–145)

## 2016-07-15 LAB — URINE CULTURE: Culture: >=100000 — AB

## 2016-07-15 LAB — GLUCOSE, CAPILLARY
Glucose-Capillary: 125 mg/dL — ABNORMAL HIGH (ref 65–99)
Glucose-Capillary: 137 mg/dL — ABNORMAL HIGH (ref 65–99)
Glucose-Capillary: 137 mg/dL — ABNORMAL HIGH (ref 65–99)
Glucose-Capillary: 159 mg/dL — ABNORMAL HIGH (ref 65–99)
Glucose-Capillary: 162 mg/dL — ABNORMAL HIGH (ref 65–99)
Glucose-Capillary: 171 mg/dL — ABNORMAL HIGH (ref 65–99)

## 2016-07-15 LAB — MAGNESIUM
Magnesium: 11.6 mg/dL (ref 1.7–2.4)
Magnesium: 1.7 mg/dL (ref 1.7–2.4)

## 2016-07-15 LAB — PHOSPHORUS: Phosphorus: 3.3 mg/dL (ref 2.5–4.6)

## 2016-07-15 MED ORDER — POTASSIUM CHLORIDE 10 MEQ/50ML IV SOLN
10.0000 meq | INTRAVENOUS | Status: AC
  Administered 2016-07-15 (×2): 10 meq via INTRAVENOUS
  Filled 2016-07-15 (×2): qty 50

## 2016-07-15 MED ORDER — HYDROXYZINE HCL 10 MG PO TABS
10.0000 mg | ORAL_TABLET | Freq: Three times a day (TID) | ORAL | Status: DC | PRN
  Administered 2016-07-15 – 2016-07-20 (×5): 10 mg via ORAL
  Filled 2016-07-15 (×8): qty 1

## 2016-07-15 MED ORDER — CIPROFLOXACIN HCL 500 MG PO TABS
500.0000 mg | ORAL_TABLET | Freq: Two times a day (BID) | ORAL | Status: DC
  Administered 2016-07-15 – 2016-07-17 (×5): 500 mg via ORAL
  Filled 2016-07-15 (×5): qty 1

## 2016-07-15 MED ORDER — FAT EMULSION 20 % IV EMUL
240.0000 mL | INTRAVENOUS | Status: AC
  Administered 2016-07-15: 240 mL via INTRAVENOUS
  Filled 2016-07-15: qty 250

## 2016-07-15 MED ORDER — MAGNESIUM SULFATE 4 GM/100ML IV SOLN
4.0000 g | Freq: Once | INTRAVENOUS | Status: AC
  Administered 2016-07-15: 4 g via INTRAVENOUS
  Filled 2016-07-15: qty 100

## 2016-07-15 MED ORDER — TRACE MINERALS CR-CU-MN-SE-ZN 10-1000-500-60 MCG/ML IV SOLN
INTRAVENOUS | Status: AC
  Administered 2016-07-15: 18:00:00 via INTRAVENOUS
  Filled 2016-07-15: qty 1992

## 2016-07-15 NOTE — Progress Notes (Signed)
      [address]       [place] [address]             [phone]      Stable day  No new issues  BP (!) 108/46   Pulse 87   Temp 99.2 F (37.3 C) (Oral)   Resp (!) 27   Ht 5\' 3"  (1.6 m)   Wt 178 lb (80.7 kg)   SpO2 99%   BMI 31.53 kg/m   Continue current care  Lakena Sparlin C. [name], MD [facility] [phone]

## 2016-07-15 NOTE — Progress Notes (Addendum)
PHARMACY - ADULT TOTAL PARENTERAL NUTRITION CONSULT NOTE   Pharmacy Consult for TPN Indication: prolonged NPO status/high NG output   Patient Measurements: Height: '5\' 3"'  (160 cm) Weight: 178 lb (80.7 kg) IBW/kg (Calculated) : 52.4 TPN AdjBW (KG): 59.4 Body mass index is 31.53 kg/m. 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 Patient had a significant hx of N/V for 2-4 weeks prior to admission. Weight appears to have been maintained. Patient had a NG tube placed 5/20 which had significant output with 1-1.5 L a day, removed NG tube on 5/30, however, patient with significant episodes of N&V and NG replaced 6/1 with continuous low suction. NG tube removed but multiple episodes of vomiting with CL trial 6/4 & NG tube replaced again 6/5. Tolerated longer clamping trial 6/11 an NGT removed.   GI: Ileus vs gastroparesis. Repeat CT 6/5 (-)perf/pneumatosis, unresolved SB dilatation. NGT removed 6/11. PPI IV Tolerating clears and having BMs - keeping clears 6/12 Mild occasional nausea, no emesis - ondansetron q6h prn. Simethicone prn, PPI-IV Endo: Hx of DM. No insulin at home. CBGs mostly controlled - 108-187 Insulin requirements in the past 24 hours: 16 units SSI, 80 units of Lantus (split 40 BID), 35 units in TPN (since 6/9) Lytes:  K 4.2 s/p 5 K runs (goal >4/= w/ ileus), Mg 1.7 s/p Mag sulf 2g x 1 (goal >/=2 w/ ileus), Phos 3.7, Na down 126. Lasix 58m IV qday Renal: AKI: SCr down to 0.99 which appears at baseline. UOP 0.656mkg/hr on Lasix. NS at 1085mr Pulm: [place]; [place], Pulmicort nebs continued Cards: Stanford type B aortic dissection; BP soft-nml, HR 80-110s in NSR-ST. Lopressor, clonidine 0.3 mg patch, lasix IV qday Hepatobil: ALT 56 (slightly above normal), AST, alk phos, and TBili remain WNL. Triglycerides 132.  Pre-albumin down to 10.4. Neuro: Celexa, Klonopin, xanax prn, naproxen prn. Fentanyl, Dialudid  prn - weaning narcotics to help with ileus.  ID: Cipro PO D1 (pan-sensitive E.faecalis in UC). S/P Meropenem (5/25-6/4) for respiratory failure from PNA. WBC 9.8 (peaked at 36.7), afebrile. CDiff neg.  Best Practices: enox ppx TPN Access: PICC line placed 5/20 TPN start date: 06/23/16  Nutritional Goals (per RD recommendation on 5/25): KCal: 2000-2200 kcal/day  Protein: 110-120 gm/day   Goal  TPN rate: Clinimix 5/15 E @ 83 ml/hr + lipid emulsion 20 ml/hr   Current Nutrition:   TPN- Clinimix E 5/15 at 83 ml/hr + lipid emulsion 20% at 20 ml/hr Clear liquids- fair/poor intake Boost BID (1 dose 6/12) - each provides 9g protein + 250kcal  Plan:  -Continue Clinimix E 5/15 at 83 ml/hr + lipid emulsion 20% at 20 ml/hr - provides 100g of protein and 1894 kCals per day meeting 91% of protein and 95% of kCal needs -Continue daily MVI in TPN + trace elements every other day, next 6/13 -Monitor CBGs closely - continue Insulin 35 units in TPN -Continue Lantus 40 units BID + TCTS SSI q4h -KCl 10m12muns x2 and magnesium sulfate 4g IV x1 for replacement -TPN labs in the AM -Follow-up diet progression and ability to wean TPN   HaleElicia LamparmD, BCPS Clinical Pharmacist Rx Phone # for today: #[phone] 3:30PM, please call Main Rx: #[id number] 7:47 AM

## 2016-07-15 NOTE — Progress Notes (Signed)
  Subjective: Walked with PT  Somewhat depressed Abdomen slightly more distended-  No emesis  Objective: Vital signs in last 24 hours: Temp:  [97 F (36.1 C)-99.2 F (37.3 C)] 99.2 F (37.3 C) (06/13 0800) Pulse Rate:  [87-115] 87 (06/13 1500) Cardiac Rhythm: Normal sinus rhythm (06/13 1100) Resp:  [20-27] 27 (06/13 1600) BP: (93-125)/(40-77) 108/46 (06/13 1500) SpO2:  [95 %-99 %] 99 % (06/13 1500)  Hemodynamic parameters for last 24 hours:    Intake/Output from previous day: 06/12 0701 - 06/13 0700 In: 1616.2 [P.O.:240; I.V.:1376.2] Out: 1100 [Urine:1100] Intake/Output this shift: Total I/O In: 1366 [P.O.:100; I.V.:1116; IV Piggyback:150] Out: 700 [Urine:600; Stool:100]  nsr abd distended but non tender extrem warm with pulses Lab Results:  Recent Labs  07/13/16 0500  WBC 9.8  HGB 8.6*  HCT 26.8*  PLT 122*   BMET:  Recent Labs  07/14/16 0510 07/15/16 0409  NA 131* 126*  K 3.7 4.2  CL 104 101  CO2 21* 18*  GLUCOSE 139* 125*  BUN 32* 31*  CREATININE 1.03* 0.99  CALCIUM 7.6* 7.5*    PT/INR: No results for input(s): LABPROT, INR in the last 72 hours. ABG    Component Value Date/Time   PHART 7.430 06/28/2016 1615   HCO3 20.7 06/28/2016 1615   TCO2 22 06/28/2016 1615   ACIDBASEDEF 3.0 (H) 06/28/2016 1615   O2SAT 93.0 06/28/2016 1615   CBG (last 3)   Recent Labs  07/15/16 0809 07/15/16 1139 07/15/16 1608  GLUCAP 137* 159* 137*    Assessment/Plan: S/P  Mobilize Diuresis Diabetes control cont TPN- taking very little po nutrition   LOS: 25 days    [name] 07/15/2016

## 2016-07-15 NOTE — Progress Notes (Signed)
Patient's Magnesium level is critical value of 11.6, drastically different from previous results. Magnesium level was redrawn to verify the value, awaiting for the result to notify MD. Continue to monitor the patient.

## 2016-07-15 NOTE — Progress Notes (Signed)
Subjective/Chief Complaint: Occasional nausea, but no emesis.  Pain stable, but does continue to have some abdominal pain.    Objective: Vital signs in last 24 hours: Temp:  [97 F (36.1 C)-100.8 F (38.2 C)] 99.2 F (37.3 C) (06/13 0800) Pulse Rate:  [97-115] 110 (06/13 0800) Resp:  [20-27] 20 (06/13 0800) BP: (83-125)/(33-77) 98/57 (06/13 0800) SpO2:  [95 %-98 %] 97 % (06/13 0800) Last BM Date: 07/13/16  Intake/Output from previous day: 06/12 0701 - 06/13 0700 In: 1616.2 [P.O.:240; I.V.:1376.2] Out: 1100 [Urine:1100] Intake/Output this shift: No intake/output data recorded.  General appearance: sleeping, arousable.   Resp: breathing slightly labored.   Cardio: regular rate and rhythm GI: soft, mild dist, mildly tender  Lab Results:   Recent Labs  07/13/16 0500  WBC 9.8  HGB 8.6*  HCT 26.8*  PLT 122*   BMET  Recent Labs  07/14/16 0510 07/15/16 0409  NA 131* 126*  K 3.7 4.2  CL 104 101  CO2 21* 18*  GLUCOSE 139* 125*  BUN 32* 31*  CREATININE 1.03* 0.99  CALCIUM 7.6* 7.5*   PT/INR No results for input(s): LABPROT, INR in the last 72 hours. ABG No results for input(s): PHART, HCO3 in the last 72 hours.  Invalid input(s): PCO2, PO2  Studies/Results: No results found.  Anti-infectives: Anti-infectives    Start     Dose/Rate Route Frequency Ordered Stop   07/15/16 0900  ciprofloxacin (CIPRO) tablet 500 mg     500 mg Oral 2 times daily 07/15/16 0828     07/14/16 1115  levofloxacin (LEVAQUIN) tablet 500 mg  Status:  Discontinued     500 mg Oral Daily 07/14/16 1107 07/14/16 1214   06/26/16 0830  meropenem (MERREM) 1 g in sodium chloride 0.9 % 100 mL IVPB  Status:  Discontinued     1 g 200 mL/hr over 30 Minutes Intravenous Every 8 hours 06/26/16 0810 07/06/16 0804   06/22/16 1800  clindamycin (CLEOCIN) IVPB 600 mg  Status:  Discontinued     600 mg 100 mL/hr over 30 Minutes Intravenous Every 8 hours 06/22/16 1723 06/26/16 0810   06/22/16 1000   azithromycin (ZITHROMAX) 250 mg in dextrose 5 % 125 mL IVPB  Status:  Discontinued     250 mg 125 mL/hr over 60 Minutes Intravenous Every 24 hours 06/22/16 0826 06/27/16 1019   06/22/16 0900  levofloxacin (LEVAQUIN) IVPB 500 mg  Status:  Discontinued     500 mg 100 mL/hr over 60 Minutes Intravenous Every 24 hours 06/22/16 0759 06/22/16 0824     Assessment/Plan: Aortic dissection Suspected intestinal angina Stay on sips of clears for now and TPN   LOS: 25 days   Haley Oshields, MD 07/15/2016  LOS: 25 days    Haley Graham 07/15/2016

## 2016-07-15 NOTE — Progress Notes (Signed)
PT Cancellation Note  Patient Details Name: Haley Graham MRN: [id number] DOB: January 08, 1956   Cancelled Treatment:    Reason Eval/Treat Not Completed: Patient declined, no reason specified Pt declined mobility at this time. Pt with unintelligible speech after declining. PT will continue to follow acutely.    Salina April, PTA Pager: [phone]   07/15/2016, 10:17 AM

## 2016-07-15 NOTE — Progress Notes (Addendum)
[facility] feels distended.     Objective (!) 110/49 99 98.8 F (37.1 C) (Oral) (!) 23 97%  Intake/Output Summary (Last 24 hours) at 07/15/16 0734 Last data filed at 07/15/16 0400  Gross per 24 hour  Intake          1616.21 ml  Output              600 ml  Net          1016.21 ml    Distended abdomin, compressible BM x 2 No Nausea over night Palpable DP  Assessment/Planning: Acute descending thoracic aortic dissection Diet per general Surgery  Laurence Slate [facility] 07/15/2016 7:34 AM --  Laboratory Lab Results:  Recent Labs  07/13/16 0500  WBC 9.8  HGB 8.6*  HCT 26.8*  PLT 122*   BMET  Recent Labs  07/14/16 0510 07/15/16 0409  NA 131* 126*  K 3.7 4.2  CL 104 101  CO2 21* 18*  GLUCOSE 139* 125*  BUN 32* 31*  CREATININE 1.03* 0.99  CALCIUM 7.6* 7.5*    COAG No results found for: INR, PROTIME No results found for: PTT

## 2016-07-15 NOTE — Progress Notes (Signed)
Physical Therapy Treatment Patient Details Name: Haley Graham MRN: [id number] DOB: Aug 10, 1955 Today's Date: 07/15/2016    History of Present Illness Pt adm with type B aortic dissection. Treated non surgically with BP control and pain control. Pt developed pancreatitis and ileus with NG tube placed. PMH - anxiety, arthritis    PT Comments    Patient continues to make gradual progress with mobility and ambulated with no AD and single UE support with 1L O2 via [place]. VSS. Pt requires encouragement for increased mobility. Continue to progress as tolerated with anticipated d/c home with HHPT.    Follow Up Recommendations  Home health PT;Supervision/Assistance - 24 hour     Equipment Recommendations  Rolling walker with 5" wheels    Recommendations for Other Services       Precautions / Restrictions Precautions Precautions: Fall Restrictions Weight Bearing Restrictions: No    Mobility  Bed Mobility Overal bed mobility: Needs Assistance Bed Mobility: Supine to Sit;Sit to Supine     Supine to sit: HOB elevated;Min guard Sit to supine: Min guard   General bed mobility comments: increased time/effort; cues for sequencing  Transfers Overall transfer level: Needs assistance Equipment used: 1 person hand held assist Transfers: Sit to/from [facility] Sit to Stand: Min guard Stand pivot transfers: Min assist       General transfer comment: HHA for stand pivot from EOB to [facility]; cues for hand placement when descending  Ambulation/Gait Ambulation/Gait assistance: Min guard;+2 safety/equipment (chair follow) Ambulation Distance (Feet): 200 Feet Assistive device:  (pt held IV pole with single UE) Gait Pattern/deviations: Step-through pattern;Decreased stride length Gait velocity: decr   General Gait Details: pt less guarded without use of AD today; cues for breathing technique throughout with pt demonstrating more carry over; VSS with mobility and 1L O2 via [place];  one seated rest break   Stairs            Wheelchair Mobility    Modified Rankin (Stroke Patients Only)       Balance Overall balance assessment: Needs assistance   Sitting balance-Leahy Scale: Good     Standing balance support: Single extremity supported Standing balance-Leahy Scale: Fair                              Cognition Arousal/Alertness: Awake/alert Behavior During Therapy: Flat affect Overall Cognitive Status: Impaired/Different from baseline Area of Impairment: Following commands;Attention;Problem solving                   Current Attention Level: Sustained   Following Commands: Follows one step commands with increased time   Awareness: Emergent Problem Solving: Decreased initiation;Requires verbal cues;Slow processing General Comments: pt following commands more consistently this session and a little more communicative      Exercises      General Comments        Pertinent Vitals/Pain Pain Assessment: Faces Faces Pain Scale: Hurts little more Pain Location: pelvis with mobility Pain Descriptors / Indicators: Grimacing;Aching Pain Intervention(s): Monitored during session;Premedicated before session;Repositioned    Home Living                      Prior Function            PT Goals (current goals can now be found in the care plan section) Acute Rehab PT Goals PT Goal Formulation: With patient/family Time For Goal Achievement: 07/10/16 Potential to Achieve Goals: Good Progress  towards PT goals: Progressing toward goals    Frequency    Min 3X/week      PT Plan Current plan remains appropriate    Co-evaluation              AM-PAC PT "6 Clicks" Daily Activity  Outcome Measure  Difficulty turning over in bed (including adjusting bedclothes, sheets and blankets)?: A Little Difficulty moving from lying on back to sitting on the side of the bed? : A Lot Difficulty sitting down on and standing up  from a chair with arms (e.g., wheelchair, bedside commode, etc,.)?: A Little Help needed moving to and from a bed to chair (including a wheelchair)?: A Little Help needed walking in hospital room?: A Little Help needed climbing 3-5 steps with a railing? : A Lot 6 Click Score: 16    End of Session Equipment Utilized During Treatment: Gait belt Activity Tolerance: Patient tolerated treatment well Patient left: in bed;with call bell/phone within reach;Other (comment) (bed in chair position--declined sitting in chair) Nurse Communication: Mobility status PT Visit Diagnosis: Muscle weakness (generalized) (M62.81);Other abnormalities of gait and mobility (R26.89)     Time: 8280-0349 PT Time Calculation (min) (ACUTE ONLY): 34 min  Charges:  $Gait Training: 8-22 mins $Therapeutic Activity: 8-22 mins                    G Codes:       Earney Navy, PTA Pager: [phone]     Darliss Cheney 07/15/2016, 4:50 PM

## 2016-07-16 ENCOUNTER — Inpatient Hospital Stay (HOSPITAL_COMMUNITY): Payer: BLUE CROSS/BLUE SHIELD

## 2016-07-16 DIAGNOSIS — I7102 Dissection of abdominal aorta: Secondary | ICD-10-CM

## 2016-07-16 LAB — GLUCOSE, CAPILLARY
Glucose-Capillary: 111 mg/dL — ABNORMAL HIGH (ref 65–99)
Glucose-Capillary: 140 mg/dL — ABNORMAL HIGH (ref 65–99)
Glucose-Capillary: 146 mg/dL — ABNORMAL HIGH (ref 65–99)
Glucose-Capillary: 150 mg/dL — ABNORMAL HIGH (ref 65–99)
Glucose-Capillary: 161 mg/dL — ABNORMAL HIGH (ref 65–99)
Glucose-Capillary: 191 mg/dL — ABNORMAL HIGH (ref 65–99)
Glucose-Capillary: 201 mg/dL — ABNORMAL HIGH (ref 65–99)

## 2016-07-16 LAB — CBC
HCT: 23.4 % — ABNORMAL LOW (ref 36.0–46.0)
Hemoglobin: 7.8 g/dL — ABNORMAL LOW (ref 12.0–15.0)
MCH: 32.8 pg (ref 26.0–34.0)
MCHC: 33.3 g/dL (ref 30.0–36.0)
MCV: 98.3 fL (ref 78.0–100.0)
Platelets: 148 10*3/uL — ABNORMAL LOW (ref 150–400)
RBC: 2.38 MIL/uL — ABNORMAL LOW (ref 3.87–5.11)
RDW: 13.5 % (ref 11.5–15.5)
WBC: 8.5 10*3/uL (ref 4.0–10.5)

## 2016-07-16 LAB — COMPREHENSIVE METABOLIC PANEL
ALT: 175 U/L — ABNORMAL HIGH (ref 14–54)
AST: 103 U/L — ABNORMAL HIGH (ref 15–41)
Albumin: 1.2 g/dL — ABNORMAL LOW (ref 3.5–5.0)
Alkaline Phosphatase: 104 U/L (ref 38–126)
Anion gap: 6 (ref 5–15)
BUN: 32 mg/dL — ABNORMAL HIGH (ref 6–20)
CO2: 17 mmol/L — ABNORMAL LOW (ref 22–32)
Calcium: 8.1 mg/dL — ABNORMAL LOW (ref 8.9–10.3)
Chloride: 107 mmol/L (ref 101–111)
Creatinine, Ser: 1.11 mg/dL — ABNORMAL HIGH (ref 0.44–1.00)
GFR calc Af Amer: >60 mL/min (ref >60)
GFR calc non Af Amer: 53 mL/min — ABNORMAL LOW (ref >60)
Glucose, Bld: 136 mg/dL — ABNORMAL HIGH (ref 65–99)
Potassium: 4.1 mmol/L (ref 3.5–5.1)
Sodium: 130 mmol/L — ABNORMAL LOW (ref 135–145)
Total Bilirubin: 0.9 mg/dL (ref 0.3–1.2)
Total Protein: 5.5 g/dL — ABNORMAL LOW (ref 6.5–8.1)

## 2016-07-16 LAB — PHOSPHORUS: Phosphorus: 4.3 mg/dL (ref 2.5–4.6)

## 2016-07-16 LAB — ABO/RH: ABO/RH(D): O POS

## 2016-07-16 LAB — MAGNESIUM: Magnesium: 1.9 mg/dL (ref 1.7–2.4)

## 2016-07-16 LAB — LACTIC ACID, PLASMA: Lactic Acid, Venous: 1.7 mmol/L (ref 0.5–1.9)

## 2016-07-16 LAB — PREPARE RBC (CROSSMATCH)

## 2016-07-16 MED ORDER — POTASSIUM CHLORIDE 10 MEQ/50ML IV SOLN
10.0000 meq | INTRAVENOUS | Status: AC
  Administered 2016-07-16 (×3): 10 meq via INTRAVENOUS
  Filled 2016-07-16 (×3): qty 50

## 2016-07-16 MED ORDER — FAT EMULSION 20 % IV EMUL
240.0000 mL | INTRAVENOUS | Status: AC
  Administered 2016-07-16: 240 mL via INTRAVENOUS
  Filled 2016-07-16: qty 250

## 2016-07-16 MED ORDER — SODIUM CHLORIDE 0.9 % IV SOLN: Freq: Once | INTRAVENOUS | Status: DC

## 2016-07-16 MED ORDER — MAGNESIUM SULFATE 4 GM/100ML IV SOLN
4.0000 g | Freq: Once | INTRAVENOUS | Status: AC
  Administered 2016-07-16: 4 g via INTRAVENOUS
  Filled 2016-07-16: qty 100

## 2016-07-16 MED ORDER — ALBUMIN HUMAN 25 % IV SOLN
12.5000 g | Freq: Four times a day (QID) | INTRAVENOUS | Status: AC
Start: 2016-07-16 — End: 2016-07-17
  Administered 2016-07-16 – 2016-07-17 (×3): 12.5 g via INTRAVENOUS
  Filled 2016-07-16 (×3): qty 50

## 2016-07-16 MED ORDER — M.V.I. ADULT IV INJ
INTRAVENOUS | Status: AC
  Administered 2016-07-16: 18:00:00 via INTRAVENOUS
  Filled 2016-07-16: qty 1992

## 2016-07-16 NOTE — Progress Notes (Signed)
Subjective/Chief Complaint: Does seem to have slight increase in pain.  No n/v.    Objective: Vital signs in last 24 hours: Temp:  [98.4 F (36.9 C)-100.1 F (37.8 C)] 98.9 F (37.2 C) (06/14 1145) Pulse Rate:  [84-107] 95 (06/14 1500) Resp:  [19-28] 22 (06/14 1500) BP: (76-123)/(30-71) 103/49 (06/14 1500) SpO2:  [95 %-100 %] 96 % (06/14 1500) FiO2 (%):  [24 %] 24 % (06/14 0023) Weight:  [82.3 kg (181 lb 7 oz)] 82.3 kg (181 lb 7 oz) (06/14 0500) Last BM Date: 07/16/16  Intake/Output from previous day: 06/13 0701 - 06/14 0700 In: 2810.4 [P.O.:100; I.V.:2560.4; IV Piggyback:150] Out: 4709 [Urine:1550; Stool:100] Intake/Output this shift: Total I/O In: 1104 [P.O.:60; I.V.:744; IV Piggyback:300] Out: 400 [Urine:400]  General appearance: sleeping, arousable.  Mild distress.   Resp: breathing remains slightly labored.   Cardio: regular rate and rhythm GI: soft, slightly more distended, slightly more tender.    Lab Results:  No results for input(s): WBC, HGB, HCT, PLT in the last 72 hours. BMET  Recent Labs  07/15/16 0409 07/16/16 0849  NA 126* 130*  K 4.2 4.1  CL 101 107  CO2 18* 17*  GLUCOSE 125* 136*  BUN 31* 32*  CREATININE 0.99 1.11*  CALCIUM 7.5* 8.1*   PT/INR No results for input(s): LABPROT, INR in the last 72 hours. ABG No results for input(s): PHART, HCO3 in the last 72 hours.  Invalid input(s): PCO2, PO2  Studies/Results: Dg Abd Portable 1v  Result Date: 07/16/2016 CLINICAL DATA:  Abdominal pain and distention. EXAM: PORTABLE ABDOMEN - 1 VIEW COMPARISON:  Single-view of the abdomen 07/13/2016 and 07/11/2016. FINDINGS: NG tube is no longer visualized. There is unchanged gaseous distention of small and large bowel. IMPRESSION: NG tube is no longer visualized. No change in gas distention of bowel suggestive of ileus. Electronically Signed   By: Inge Rise M.D.   On: 07/16/2016 07:53    Anti-infectives: Anti-infectives    Start     Dose/Rate Route  Frequency Ordered Stop   07/15/16 0900  ciprofloxacin (CIPRO) tablet 500 mg     500 mg Oral 2 times daily 07/15/16 0828     07/14/16 1115  levofloxacin (LEVAQUIN) tablet 500 mg  Status:  Discontinued     500 mg Oral Daily 07/14/16 1107 07/14/16 1214   06/26/16 0830  meropenem (MERREM) 1 g in sodium chloride 0.9 % 100 mL IVPB  Status:  Discontinued     1 g 200 mL/hr over 30 Minutes Intravenous Every 8 hours 06/26/16 0810 07/06/16 0804   06/22/16 1800  clindamycin (CLEOCIN) IVPB 600 mg  Status:  Discontinued     600 mg 100 mL/hr over 30 Minutes Intravenous Every 8 hours 06/22/16 1723 06/26/16 0810   06/22/16 1000  azithromycin (ZITHROMAX) 250 mg in dextrose 5 % 125 mL IVPB  Status:  Discontinued     250 mg 125 mL/hr over 60 Minutes Intravenous Every 24 hours 06/22/16 0826 06/27/16 1019   06/22/16 0900  levofloxacin (LEVAQUIN) IVPB 500 mg  Status:  Discontinued     500 mg 100 mL/hr over 60 Minutes Intravenous Every 24 hours 06/22/16 0759 06/22/16 0824     Assessment/Plan: Aortic dissection 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  Do not think situation will resolve any time soon. Would go to strict NPO at this point and consider repeat NGT placement for distention.  Would not necessarily repeat CT at this time, but could consider if WBCs up or if worse tomorrow.  LOS: 26 days   Anola Mcgough, MD 07/16/2016  LOS: 26 days    Ayerim Berquist 07/16/2016

## 2016-07-16 NOTE — Progress Notes (Addendum)
[facility] DAILY ICU PROGRESS NOTE                   [address]            [place] [address]          [phone]       Total Length of Stay:  LOS: 26 days   Subjective: Patient with complaints of abdominal pain (which she has had) and nausea. She denies emesis. She is thirsty this am.  Objective: Vital signs in last 24 hours: Temp:  [98.4 F (36.9 C)-100.1 F (37.8 C)] 98.4 F (36.9 C) (06/14 0700) Pulse Rate:  [84-106] 102 (06/14 0600) Cardiac Rhythm: Sinus tachycardia (06/14 0400) Resp:  [20-27] 27 (06/14 0600) BP: (94-123)/(30-60) 123/57 (06/14 0600) SpO2:  [95 %-100 %] 96 % (06/14 0600) FiO2 (%):  [24 %] 24 % (06/14 0023) Weight:  [82.3 kg (181 lb 7 oz)] 82.3 kg (181 lb 7 oz) (06/14 0500)  Filed Weights   07/12/16 0500 07/14/16 0630 07/16/16 0500  Weight: 80 kg (176 lb 5.9 oz) 80.7 kg (178 lb) 82.3 kg (181 lb 7 oz)       Intake/Output from previous day: 06/13 0701 - 06/14 0700 In: 2687.4 [P.O.:100; I.V.:2437.4; IV Piggyback:150] Out: 3546 [Urine:1550; Stool:100]  Intake/Output this shift: No intake/output data recorded.  Current Meds: Scheduled Meds: . arformoterol  15 mcg Nebulization BID  . budesonide (PULMICORT) nebulizer solution  0.5 mg Nebulization BID  . chlorhexidine  15 mL Mouth Rinse BID  . Chlorhexidine Gluconate Cloth  6 each Topical Q0600  . ciprofloxacin  500 mg Oral BID  . citalopram  40 mg Oral Daily  . clonazePAM  1 mg Oral QHS  . cloNIDine  0.3 mg Transdermal Weekly  . enoxaparin (LOVENOX) injection  30 mg Subcutaneous Q24H  . feeding supplement  1 Container Oral BID BM  . furosemide  40 mg Intravenous Daily  . insulin aspart  0-24 Units Subcutaneous Q4H  . insulin glargine  40 Units Subcutaneous BID  . magic mouthwash  5 mL Oral QID  . mouth rinse  15 mL Mouth Rinse q12n4p  . metoprolol tartrate  25 mg Oral BID  . pantoprazole (PROTONIX) IV  40 mg Intravenous Q24H  . sodium chloride flush  10-40 mL Intracatheter Q12H    Continuous Infusions: . sodium chloride    . sodium chloride 10 mL/hr at 07/15/16 2000  . Marland KitchenTPN (CLINIMIX-E) Adult 83 mL/hr at 07/15/16 2000   PRN Meds:.Place/Maintain arterial line **AND** sodium chloride, acetaminophen **OR** acetaminophen, albuterol, ALPRAZolam, fentaNYL (SUBLIMAZE) injection, Gerhardt's butt cream, [START ON 07/19/2016] HYDROmorphone, hydrOXYzine, labetalol, naproxen, ondansetron (ZOFRAN) IV, phenol, pneumococcal 23 valent vaccine, promethazine, simethicone, sodium chloride flush  Heart: RRR Lungs: clear to auscultation bilaterally, breathing slightly labored this am Abdomen: Soft, distended, diffusely tender mostly across lower abdomen, rare bowel sounds Extremities: SCDs in place  Lab Results: CBC:No results for input(s): WBC, HGB, HCT, PLT in the last 72 hours. BMET:  Recent Labs  07/14/16 0510 07/15/16 0409  NA 131* 126*  K 3.7 4.2  CL 104 101  CO2 21* 18*  GLUCOSE 139* 125*  BUN 32* 31*  CREATININE 1.03* 0.99  CALCIUM 7.6* 7.5*    CMET: Lab Results  Component Value Date   WBC 9.8 07/13/2016   HGB 8.6 (L) 07/13/2016   HCT 26.8 (L) 07/13/2016   PLT 122 (L) 07/13/2016   GLUCOSE 125 (H) 07/15/2016   TRIG 132 07/13/2016  ALT 56 (H) 07/13/2016   AST 41 07/13/2016   NA 126 (L) 07/15/2016   K 4.2 07/15/2016   CL 101 07/15/2016   CREATININE 0.99 07/15/2016   BUN 31 (H) 07/15/2016   CO2 18 (L) 07/15/2016      PT/INR: No results for input(s): LABPROT, INR in the last 72 hours. Radiology: Dg Abd Portable 1v  Result Date: 07/16/2016 CLINICAL DATA:  Abdominal pain and distention. EXAM: PORTABLE ABDOMEN - 1 VIEW COMPARISON:  Single-view of the abdomen 07/13/2016 and 07/11/2016. FINDINGS: NG tube is no longer visualized. There is unchanged gaseous distention of small and large bowel. IMPRESSION: NG tube is no longer visualized. No change in gas distention of bowel suggestive of ileus. Electronically Signed   By: Haley Graham Graham.D.   On: 07/16/2016  07:53     Assessment/Plan: 1. CV-SR in the 90's this am. SBP well controlled as 120's or less. Continue Lopressor 25 mg bid, Clonidine 0.3 mg patch. 2. Pulmonary-On 1 liter of oxygen via [place]. Continue Pulmicort and Brovana nebs. 3. GI-KUB this am shows distention of small and large bowel-still with ileus. On sips of clear liquids (not taking much) and TPN. Management per [facility] 4. Continue Lasix 40 mg IV daily 5. DM-CBGs 191/201/150. Continue Insulin.    ZIMMERMAN,Haley M PA-C 07/16/2016 8:19 AM    GI function remains the limiting factor in her recovery BP , cardiac and pulmonary status satisfactory KUB worse today, CO2 is dropping -check lactic acid  Gen Surg following ischemic bowel patient examined and medical record reviewed,agree with above note. Haley Graham 07/16/2016

## 2016-07-16 NOTE — Progress Notes (Signed)
Haley Graham NOTE   Pharmacy Consult for TPN Indication: prolonged NPO status/high NG output   Patient Measurements: Height: _0  (160 cm) Weight: 181 lb 7 oz (82.3 kg) IBW/kg (Calculated) : 52.4 TPN AdjBW (KG): 59.4 Body mass index is 32.14 kg/m. 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 Patient had a significant hx of N/V for 2-4 weeks prior to admission. Weight appears to have been maintained. Patient had a NG tube placed 5/20 which had significant output with 1-1.5 L a day, removed NG tube on 5/30, however, patient with significant episodes of N&V and NG replaced 6/1 with continuous low suction. NG tube removed but multiple episodes of vomiting with CL trial 6/4 & NG tube replaced again 6/5. Tolerated longer clamping trial 6/11 an NGT removed.   GI: Ileus vs gastroparesis. Repeat CT 6/5 (-)perf/pneumatosis, unresolved SB dilatation. NGT removed 6/11. PPI IV Tolerating clears and having BMs but poor intake reported Still w/ nausea, no emesis - ondansetron q6h prn, simethicone prn, PPI-IV Endo: Hx of DM. No insulin at home. CBGs trending up a bit 137-201 Insulin requirements in the past 24 hours: 22 units SSI, 80 units of Lantus (split 40 BID), 35 units in TPN Lytes: Na up 130, K down 4.1 s/p 2 K runs (goal >/=4 w/ ileus), Mg up 1.9 s/p Mag sulf 4g x 1 (goal >/=2 w/ ileus), Phos up 4.3, coCa~10.4 Renal: SCr back up 1.11. UOP 0.104m/kg/hr on Lasix 451mIV QDay. NS at 1048mr Pulm: [place]; Brovana, Pulmicort nebs continued Cards: Stanford type B aortic dissection; BP soft-nml, HR ST 80-110s. Lopressor, clonidine 0.3 mg patch, lasix IV qday Hepatobil: ALT/ALT bumped 103/175, alk phos, and TBili remain WNL. Triglycerides 132.  Pre-albumin down to 10.4. Neuro: Celexa, Klonopin, xanax prn, naproxen prn, apap prn, hydroxyzine prn. Fentanyl/Dialudid prn - weaning narcotics to  help with ileus.  ID: Cipro PO D2 (pan-sensitive E.faecalis in UC). S/P Meropenem (5/25-6/4) for respiratory failure from PNA. WBC WNL, afebrile. CDiff neg. Repeat UC on 6/13  Best Practices: enox ppx per T[facility] TPN Access: PICC line placed 5/20 TPN start date: 06/23/16  Nutritional Goals (per RD recommendation on 5/25): KCal: 2000-2200 kcal/day  Protein: 110-120 gm/day   Goal TPN rate: Clinimix 5/15 E @ 83 ml/hr + lipid emulsion 20 ml/hr   Current Nutrition:   TPN- Clinimix E 5/15 at 83 ml/hr + lipid emulsion 20% at 20 ml/hr Clear liquids- poor intake Boost BID (1 dose 6/13) - each provides 9g protein + 250kcal  Plan:  -Continue Clinimix E 5/15 at 83 ml/hr + lipid emulsion 20% at 20 ml/hr - provides 100g of protein and 1894 kCals per day meeting 91% of protein and 95% of kCal needs -Continue daily MVI in TPN + trace elements every other day, next 6/15 -Increase regular insulin to 45 units in TPN and monitor CBGs -Continue Lantus 40 units BID + TCTS SSI q4h -Supplement K runs x 3 + mag sulfate 4g IV x 1 -F/u AM labs -Follow-up diet progression and ability to wean TPN   [name], BCPS Clinical Pharmacist Rx Phone # for today: #[phone] 3:30PM, please call Main Rx: #[id number] 7:49 AM

## 2016-07-16 NOTE — Progress Notes (Signed)
Patient ID: Haley Graham, female   DOB: 09-09-1955, 61 y.o.   MRN: [id number]  SICU Evening Rounds:  BP has been on the low side at times and Hgb 7.8 this afternoon. Will give her a unit of PRBC's.  Lactic acid 1.7  She has had several BM's today but abdomen remains distended.  sats 95% on room air  Urine output ok.

## 2016-07-17 ENCOUNTER — Inpatient Hospital Stay (HOSPITAL_COMMUNITY): Payer: BLUE CROSS/BLUE SHIELD

## 2016-07-17 LAB — GLUCOSE, CAPILLARY
Glucose-Capillary: 100 mg/dL — ABNORMAL HIGH (ref 65–99)
Glucose-Capillary: 95 mg/dL (ref 65–99)
Glucose-Capillary: 106 mg/dL — ABNORMAL HIGH (ref 65–99)
Glucose-Capillary: 88 mg/dL (ref 65–99)
Glucose-Capillary: 128 mg/dL — ABNORMAL HIGH (ref 65–99)
Glucose-Capillary: 127 mg/dL — ABNORMAL HIGH (ref 65–99)

## 2016-07-17 LAB — CBC
HCT: 25.4 % — ABNORMAL LOW (ref 36.0–46.0)
Hemoglobin: 8.2 g/dL — ABNORMAL LOW (ref 12.0–15.0)
MCH: 32.2 pg (ref 26.0–34.0)
MCHC: 32.3 g/dL (ref 30.0–36.0)
MCV: 99.6 fL (ref 78.0–100.0)
Platelets: 152 10*3/uL (ref 150–400)
RBC: 2.55 MIL/uL — ABNORMAL LOW (ref 3.87–5.11)
RDW: 15.2 % (ref 11.5–15.5)
WBC: 9.2 10*3/uL (ref 4.0–10.5)

## 2016-07-17 LAB — BASIC METABOLIC PANEL
Anion gap: 5 (ref 5–15)
BUN: 30 mg/dL — ABNORMAL HIGH (ref 6–20)
CO2: 18 mmol/L — ABNORMAL LOW (ref 22–32)
Calcium: 8 mg/dL — ABNORMAL LOW (ref 8.9–10.3)
Chloride: 109 mmol/L (ref 101–111)
Creatinine, Ser: 1.21 mg/dL — ABNORMAL HIGH (ref 0.44–1.00)
GFR calc Af Amer: 56 mL/min — ABNORMAL LOW (ref >60)
GFR calc non Af Amer: 48 mL/min — ABNORMAL LOW (ref >60)
Glucose, Bld: 95 mg/dL (ref 65–99)
Potassium: 4.2 mmol/L (ref 3.5–5.1)
Sodium: 132 mmol/L — ABNORMAL LOW (ref 135–145)

## 2016-07-17 LAB — PHOSPHORUS: Phosphorus: 3.9 mg/dL (ref 2.5–4.6)

## 2016-07-17 LAB — HEPATIC FUNCTION PANEL
ALT: 172 U/L — ABNORMAL HIGH (ref 14–54)
AST: 103 U/L — ABNORMAL HIGH (ref 15–41)
Albumin: 1.5 g/dL — ABNORMAL LOW (ref 3.5–5.0)
Alkaline Phosphatase: 99 U/L (ref 38–126)
Bilirubin, Direct: 0.9 mg/dL — ABNORMAL HIGH (ref 0.1–0.5)
Indirect Bilirubin: 0.7 mg/dL (ref 0.3–0.9)
Total Bilirubin: 1.6 mg/dL — ABNORMAL HIGH (ref 0.3–1.2)
Total Protein: 5.4 g/dL — ABNORMAL LOW (ref 6.5–8.1)

## 2016-07-17 LAB — URINE CULTURE
Culture: >=100000 — AB
Special Requests: NORMAL

## 2016-07-17 LAB — MAGNESIUM: Magnesium: 1.9 mg/dL (ref 1.7–2.4)

## 2016-07-17 MED ORDER — LEVOFLOXACIN 500 MG PO TABS
500.0000 mg | ORAL_TABLET | Freq: Every day | ORAL | Status: DC
  Filled 2016-07-17: qty 1

## 2016-07-17 MED ORDER — IOPAMIDOL (ISOVUE-300) INJECTION 61%
INTRAVENOUS | Status: AC
  Filled 2016-07-17: qty 30

## 2016-07-17 MED ORDER — LEVOFLOXACIN IN D5W 500 MG/100ML IV SOLN: 500.0000 mg | INTRAVENOUS | Status: DC

## 2016-07-17 MED ORDER — TRACE MINERALS CR-CU-MN-SE-ZN 10-1000-500-60 MCG/ML IV SOLN
INTRAVENOUS | Status: AC
  Administered 2016-07-17: 18:00:00 via INTRAVENOUS
  Filled 2016-07-17: qty 1990

## 2016-07-17 MED ORDER — FAT EMULSION 20 % IV EMUL
240.0000 mL | INTRAVENOUS | Status: AC
  Administered 2016-07-17: 240 mL via INTRAVENOUS
  Filled 2016-07-17: qty 250

## 2016-07-17 MED ORDER — FAT EMULSION 20 % IV EMUL
240.0000 mL | INTRAVENOUS | Status: DC
  Filled 2016-07-17: qty 250

## 2016-07-17 MED ORDER — TRACE MINERALS CR-CU-MN-SE-ZN 10-1000-500-60 MCG/ML IV SOLN
INTRAVENOUS | Status: DC
  Filled 2016-07-17: qty 1990

## 2016-07-17 MED ORDER — INSULIN GLARGINE 100 UNIT/ML ~~LOC~~ SOLN
35.0000 [IU] | Freq: Two times a day (BID) | SUBCUTANEOUS | Status: DC
  Administered 2016-07-17 – 2016-07-19 (×5): 35 [IU] via SUBCUTANEOUS
  Filled 2016-07-17 (×6): qty 0.35

## 2016-07-17 MED ORDER — ALBUMIN HUMAN 25 % IV SOLN
12.5000 g | Freq: Four times a day (QID) | INTRAVENOUS | Status: AC
  Administered 2016-07-17 – 2016-07-19 (×8): 12.5 g via INTRAVENOUS
  Filled 2016-07-17 (×10): qty 50

## 2016-07-17 MED ORDER — VANCOMYCIN HCL 10 G IV SOLR
1500.0000 mg | INTRAVENOUS | Status: AC
  Administered 2016-07-17 – 2016-07-23 (×7): 1500 mg via INTRAVENOUS
  Filled 2016-07-17 (×7): qty 1500

## 2016-07-17 NOTE — Progress Notes (Signed)
      [address]       [place] [address]             [phone]      Sleeping at present  BP (!) 120/59   Pulse (!) 112   Temp 99 F (37.2 C) (Oral)   Resp (!) 27   Ht 5\' 3"  (1.6 m)   Wt 181 lb (82.1 kg)   SpO2 96%   BMI 32.06 kg/m    Intake/Output Summary (Last 24 hours) at 07/17/16 1810 Last data filed at 07/17/16 1700  Gross per 24 hour  Intake             2579 ml  Output                0 ml  Net             2579 ml    Waiting on abdominal CT  Remo Lipps C. [name], MD [facility] [phone]

## 2016-07-17 NOTE — Progress Notes (Signed)
Subjective: Interval History: none..   Objective: Vital signs in last 24 hours: Temp:  [98.5 F (36.9 C)-101.4 F (38.6 C)] 99 F (37.2 C) (06/15 1600) Pulse Rate:  [93-114] 112 (06/15 1700) Resp:  [14-37] 27 (06/15 1700) BP: (89-129)/(34-62) 120/59 (06/15 1700) SpO2:  [93 %-97 %] 96 % (06/15 1700) Weight:  [181 lb (82.1 kg)] 181 lb (82.1 kg) (06/15 0500)  Intake/Output from previous day: 06/14 0701 - 06/15 0700 In: 2750.5 [P.O.:60; I.V.:2340.5; IV Piggyback:350] Out: 750 [Urine:400; Stool:350] Intake/Output this shift: Total I/O In: 1180 [P.O.:150; I.V.:930; IV Piggyback:100] Out: -  Some nausea with no vomiting. Was up walking this morning Abdomen soft nontender  Lab Results:  Recent Labs  07/16/16 1428 07/17/16 0458  WBC 8.5 9.2  HGB 7.8* 8.2*  HCT 23.4* 25.4*  PLT 148* 152   BMET  Recent Labs  07/16/16 0849 07/17/16 0630  NA 130* 132*  K 4.1 4.2  CL 107 109  CO2 17* 18*  GLUCOSE 136* 95  BUN 32* 30*  CREATININE 1.11* 1.21*  CALCIUM 8.1* 8.0*    Studies/Results: Ct Abdomen Pelvis Wo Contrast  Result Date: 06/26/2016 CLINICAL DATA:  Shortness of breath EXAM: CT CHEST, ABDOMEN AND PELVIS WITHOUT CONTRAST TECHNIQUE: Multidetector CT imaging of the chest, abdomen and pelvis was performed following the standard protocol without IV contrast. COMPARISON:  06/20/2016 chest CT FINDINGS: CT CHEST FINDINGS Cardiovascular: Normal heart size. No pericardial effusion. 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 Mild haziness around the upper descending segment is likely stable. Stable maximal diameter of 33 mm at this level. No intramural hematoma. Intermittently seen displaced intimal calcification. Mediastinum/Nodes: Negative for adenopathy. Right upper extremity PICC with tip at the SVC level. Lungs/Pleura: Multi segment atelectasis, worse on the left. Small pleural effusions. There is patchy ground-glass airspace density in the bilateral lungs  without gradient. Airways are clear and there is no septal thickening. Musculoskeletal: No acute or aggressive finding. CT ABDOMEN PELVIS FINDINGS Hepatobiliary: Hepatic steatosis.Cholecystectomy with normal common bile duct diameter. Pancreas: Unremarkable. Spleen: Unremarkable. 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 The larger measures up to 27 mm and is indeterminate by densitometry, but left adrenal mass has been noted since at least 2006 abdominal CT report, images not available. Left renal cyst. No hydronephrosis or urolithiasis. 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 Negative decompressed urinary bladder. Stomach/Bowel: There is diffuse small bowel distention and fluid filling with mildly hazy mesenteries. Similar features in the colon proximally and at the transverse segment. No pneumatosis or perforation is noted. Nasogastric tube tip is at the pylorus. Vascular/Lymphatic: There is intimal flap displacement from known dissection, morphology appearing similar to prior. No mass or adenopathy. Reproductive:Hysterectomy.  Unremarkable ovaries. Other: No ascites or pneumoperitoneum.  Anasarca. Musculoskeletal: No acute abnormalities. These results were called by telephone at the time of interpretation on 06/26/2016 at 2:05 pm to Dr. Ivin Poot , who verbally acknowledged these results. IMPRESSION: 1. Diffuse airspace disease. Pattern can be seen with noncardiogenic edema (ARDS), inflammatory pneumonitis, or atypical infection. Multi segment atelectasis at the bases and small pleural effusions. 2. Diffuse small bowel and colonic distention with fluid levels as seen with ileus. Question underlying bowel ischemia given the patient's known aortic dissection with proximal IMA occlusion and SMA flow via the narrow true lumen. 3. Incidental findings noted above. Electronically Signed   By: Neva Seat.D.  On: 06/26/2016 14:05   Ct Chest Wo Contrast  Result Date: 06/26/2016 CLINICAL DATA:  Shortness of breath EXAM: CT CHEST, ABDOMEN AND PELVIS WITHOUT CONTRAST TECHNIQUE: Multidetector CT imaging of the chest, abdomen and pelvis was performed following the standard protocol without IV contrast. COMPARISON:  06/20/2016 chest CT FINDINGS: CT CHEST FINDINGS Cardiovascular: Normal heart size. No pericardial effusion. 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 Mild haziness around the upper descending segment is likely stable. Stable maximal diameter of 33 mm at this level. No intramural hematoma. Intermittently seen displaced intimal calcification. Mediastinum/Nodes: Negative for adenopathy. Right upper extremity PICC with tip at the SVC level. Lungs/Pleura: Multi segment atelectasis, worse on the left. Small pleural effusions. There is patchy ground-glass airspace density in the bilateral lungs without gradient. Airways are clear and there is no septal thickening. Musculoskeletal: No acute or aggressive finding. CT ABDOMEN PELVIS FINDINGS Hepatobiliary: Hepatic steatosis.Cholecystectomy with normal common bile duct diameter. Pancreas: Unremarkable. Spleen: Unremarkable. 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 The larger measures up to 27 mm and is indeterminate by densitometry, but left adrenal mass has been noted since at least 2006 abdominal CT report, images not available. Left renal cyst. No hydronephrosis or urolithiasis. 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 Negative decompressed urinary bladder. Stomach/Bowel: There is diffuse small bowel distention and fluid filling with mildly hazy mesenteries. Similar features in the colon proximally and at the transverse segment. No pneumatosis or perforation is noted. Nasogastric tube tip is at the pylorus. Vascular/Lymphatic: There is intimal flap  displacement from known dissection, morphology appearing similar to prior. No mass or adenopathy. Reproductive:Hysterectomy.  Unremarkable ovaries. Other: No ascites or pneumoperitoneum.  Anasarca. Musculoskeletal: No acute abnormalities. These results were called by telephone at the time of interpretation on 06/26/2016 at 2:05 pm to Dr. Ivin Poot , who verbally acknowledged these results. IMPRESSION: 1. Diffuse airspace disease. Pattern can be seen with noncardiogenic edema (ARDS), inflammatory pneumonitis, or atypical infection. Multi segment atelectasis at the bases and small pleural effusions. 2. Diffuse small bowel and colonic distention with fluid levels as seen with ileus. Question underlying bowel ischemia given the patient's known aortic dissection with proximal IMA occlusion and SMA flow via the narrow true lumen. 3. Incidental findings noted above. Electronically Signed   By: Monte Fantasia M.D.   On: 06/26/2016 14:05   Ct Abdomen Pelvis W Contrast  Result Date: 07/07/2016 CLINICAL DATA:  61 year old female with abdominal pain and distension greater on the right. Subsequent encounter. EXAM: CT ABDOMEN AND PELVIS WITH CONTRAST TECHNIQUE: Multidetector CT imaging of the abdomen and pelvis was performed using the standard protocol following bolus administration of intravenous contrast. CONTRAST:  75 cc Isovue 300. COMPARISON:  07/06/2016 plain film exam. 06/27/2016 CT angiogram chest, abdomen and pelvis. 10/27/2011 FINDINGS: Lower chest: Basilar atelectasis greatest medial left lung base. Heart top-normal size with aortic/mitral valve calcification. Hepatobiliary: Mild fatty infiltration of the liver without worrisome hepatic lesion. Post cholecystectomy. Pancreas: No mass or inflammation.  No duct dilation. Spleen: No mass or enlargement. Adrenals/Urinary Tract: Prominent infarct left kidney. Left renal cyst. Nodularity adrenal glands greater on the left has changed slightly since 2013 with largest  left adrenal nodule currently measuring 2.6 x 2.5 x 1.9 cm versus on 2013 exam 2.4 x 2.3 x 1.9 cm Stomach/Bowel: Fluid and gas-filled prominent size small bowel without point of transition noted. Fluid-filled featureless colon. Minimal  amount of fluid adjacent to small bowel loops and minimal hazy infiltration of fat planes surrounding the descending colon. No pneumatosis or free intraperitoneal air. Findings may reflect changes of enterocolitis of indeterminate etiology whether ischemic, inflammatory or infectious. No pneumatosis or free intraperitoneal air. 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 Dissection extends into iliac arteries. Inferior mesenteric artery may fill in a retrograde fashion. Appearance unchanged from recent CT angiogram. Scattered normal size lymph nodes. Reproductive: No worrisome abnormality. Foley catheter with decompressed urinary bladder. Other: No bowel containing hernia. Musculoskeletal: No acute osseous abnormality. IMPRESSION: Fluid and gas-filled prominent size small bowel without worrisome point of transition. Fluid-filled featureless colon. Minimal amount of fluid adjacent to small bowel loops and minimal hazy infiltration of fat planes surrounding the descending colon. No pneumatosis or free intraperitoneal air. Findings may reflect changes of enterocolitis of indeterminate etiology whether ischemic, inflammatory or infectious. No pneumatosis or free intraperitoneal air. 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 Infarction majority of the left kidney. Adrenal gland nodularity greater on left stable since recent exams and minimally changed since 2013 as detailed above. Electronically Signed   By: Genia Del M.D.   On: 07/07/2016 14:22   Dg Chest Port 1 View  Result Date: 07/13/2016 CLINICAL  DATA:  Follow-up examination for aortic dissection. EXAM: PORTABLE CHEST 1 VIEW COMPARISON:  Prior radiograph from 07/08/2016. FINDINGS: Enteric tube courses in the the abdomen. Right-sided PICC catheter remains in place with tip overlying the cavoatrial junction. Stable cardiomegaly. Mediastinal silhouette unchanged and within normal limits. Lungs hypoinflated. Persistent patchy and linear left basilar opacity, which may reflect atelectasis or infiltrate. Previously seen left pleural effusion has largely resolved. No new focal airspace opacity. No pulmonary edema. No pneumothorax. No acute osseus abnormality. IMPRESSION: 1. Interval decrease with near resolution of left pleural effusion. Persistent patchy left basilar opacity also improved, and may reflect residual atelectasis and/or infiltrate. 2. No other new cardiopulmonary finding. Electronically Signed   By: Jeannine Boga M.D.   On: 07/13/2016 06:42   Dg Chest Port 1 View  Result Date: 07/08/2016 CLINICAL DATA:  Shortness of Breath EXAM: PORTABLE CHEST 1 VIEW COMPARISON:  July 04, 2016 FINDINGS: Central catheter tip is in the superior vena cava, stable. Nasogastric tube tip and side port are in the stomach. No pneumothorax. There is persistent airspace consolidation in the left lower lobe with small left pleural effusion. Right lung is clear. Heart is upper normal in size with pulmonary vascularity within normal limits. No adenopathy. No evident bone lesions. IMPRESSION: Tube and catheter positions as described without pneumothorax. Persistent left lower lobe consolidation with small left pleural effusion. No new parenchymal lung opacity. Stable cardiac silhouette. Electronically Signed   By: Lowella Grip III M.D.   On: 07/08/2016 08:00   Dg Chest Port 1 View  Result Date: 07/04/2016 CLINICAL DATA:  Followup shortness of breath.  Aortic dissection. EXAM: PORTABLE CHEST 1 VIEW COMPARISON:  07/01/2016 FINDINGS: Nasogastric tube enters the  stomach. Right arm PICC tip is in the SVC above right atrium. Less pulmonary edema. Persistent left effusion. Persistent volume loss in both lower lobes left worse than right. Chronic prominence of the aortic shadow consistent with the history of dissection. IMPRESSION: Less pulmonary edema. Persistent left effusion. Persistent atelectasis in both lower lobes left more than right. Persistent prominent aortic/mediastinal shadow. Electronically Signed  By: Nelson Chimes M.D.   On: 07/04/2016 08:11   Dg Chest Port 1 View  Result Date: 07/01/2016 CLINICAL DATA:  Shortness of breath common pneumonia, thoracic aortic dissection EXAM: PORTABLE CHEST 1 VIEW COMPARISON:  Portable chest x-ray of Jun 30, 2016 FINDINGS: The lungs are adequately inflated. The pulmonary interstitial markings are less prominent. The retrocardiac region remains dense and there is remains partial obscuration of the left hemidiaphragm. The heart is top-normal in size. The mediastinum is normal in width. The esophagogastric tube tip projects below the inferior margin of the image. The right PICC line tip projects over the midportion of the SVC. IMPRESSION: Slight interval improvement in the appearance of the pulmonary interstitium suggests decreasing interstitial edema. There is persistent left lower lobe atelectasis or pneumonia. Electronically Signed   By: David  Martinique M.D.   On: 07/01/2016 07:35   Dg Chest Port 1 View  Result Date: 06/30/2016 CLINICAL DATA:  Breath, pneumonia, aortic aneurysm, GERD, hypertension, diabetes mellitus EXAM: PORTABLE CHEST 1 VIEW COMPARISON:  Portable exam 0633 hours compared to 06/29/2016 FINDINGS: Nasogastric tube coiled in proximal stomach. RIGHT arm PICC line tip projects over SVC above cavoatrial junction. Upper normal heart size. Mediastinal contours normal. Mild pulmonary vascular congestion. Persistent diffuse BILATERAL infiltrates. Atelectasis versus consolidation LEFT lower lobe. No pneumothorax.  IMPRESSION: Persistent mild diffuse interstitial infiltrates with atelectasis versus consolidation in LEFT lower lobe. Electronically Signed   By: Lavonia Dana M.D.   On: 06/30/2016 06:58   Dg Chest Port 1 View  Result Date: 06/29/2016 CLINICAL DATA:  Shortness of Breath EXAM: PORTABLE CHEST 1 VIEW COMPARISON:  06/28/2016 FINDINGS: Nasogastric catheter is noted coiled within the stomach. Right-sided PICC line is seen in the distal superior vena cava. Cardiac shadow is mildly enlarged but stable. Bilateral vascular congestion and edema is seen stable from prior exam. Some atelectasis remains in the left retrocardiac region. IMPRESSION: No significant interval change from the prior exam. Electronically Signed   By: Inez Catalina M.D.   On: 06/29/2016 07:36   Dg Chest Port 1 View  Result Date: 06/28/2016 CLINICAL DATA:  Aortic dissection. EXAM: PORTABLE CHEST 1 VIEW COMPARISON:  06/27/2016. FINDINGS: The heart is enlarged. There is moderate vascular congestion. Prominence of the aortic contour reflecting the type B dissection. LEFT lower lobe atelectasis with effusion. IMPRESSION: Cardiomegaly. Aortic dissection. Vascular congestion. LEFT lower lobe atelectasis with effusion. Electronically Signed   By: Staci Righter M.D.   On: 06/28/2016 07:13   Dg Chest Port 1 View  Result Date: 06/27/2016 CLINICAL DATA:  ARDS. EXAM: PORTABLE CHEST 1 VIEW COMPARISON:  Jun 26, 2016 FINDINGS: The right PICC line is stable. The NG tube terminates below today's study. Diffuse bilateral primarily interstitial opacities are mildly more homogeneous in less patchy in appearance. More focal opacity in the left base is stable. No other changes. IMPRESSION: 1. Diffuse bilateral primarily interstitial opacities suggesting edema remain. More focal opacity in the left base is stable to mildly improved. Electronically Signed   By: Dorise Bullion III M.D   On: 06/27/2016 07:21   Dg Chest Port 1 View  Result Date: 06/26/2016 CLINICAL  DATA:  Shortness of breath. EXAM: PORTABLE CHEST 1 VIEW COMPARISON:  Radiograph of Jun 25, 2016. FINDINGS: Stable cardiomegaly. Increased bilateral diffuse interstitial and basilar opacities are noted concerning for worsening edema. No pneumothorax is noted. Mild left pleural effusion is noted with associated atelectasis or infiltrate. Right-sided PICC line is unchanged in position. Nasogastric tube is unchanged in position.  Bony thorax is unremarkable. IMPRESSION: Increased bilateral diffuse interstitial densities consistent with edema. Mild left pleural effusion is noted with associated atelectasis or infiltrate. Electronically Signed   By: Marijo Conception, M.D.   On: 06/26/2016 07:40   Dg Chest Port 1 View  Result Date: 06/25/2016 CLINICAL DATA:  Shortness of Breath EXAM: PORTABLE CHEST 1 VIEW COMPARISON:  06/24/2016 FINDINGS: Cardiac shadow is prominent but stable. Right-sided PICC line and nasogastric catheter are again seen and stable. Bibasilar atelectatic changes are noted worse on the left than the right and increased from the prior exam. Small left pleural effusion is noted as well. Mild vascular congestion is seen. IMPRESSION: Increasing bibasilar infiltrates left greater than right. Mild vascular congestion is noted. The Electronically Signed   By: Inez Catalina M.D.   On: 06/25/2016 07:54   Dg Chest Port 1 View  Result Date: 06/24/2016 CLINICAL DATA:  Shortness of Breath EXAM: PORTABLE CHEST 1 VIEW COMPARISON:  06/23/2016 FINDINGS: Right-sided PICC line is again noted at the cavoatrial junction. Nasogastric catheter is noted in satisfactory position through the overall inspiratory effort is decreased from the prior exam. Mild left basilar atelectasis remains. No other focal abnormality is noted. IMPRESSION: Stable left basilar atelectasis. Electronically Signed   By: Inez Catalina M.D.   On: 06/24/2016 08:07   Dg Chest Port 1 View  Result Date: 06/23/2016 CLINICAL DATA:  61 year old female  with shortness of breath and abdominal pain. 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 Being treated conservatively at this time, including with bowel rest. EXAM: PORTABLE CHEST 1 VIEW COMPARISON:  Chest radiographs 06/22/2016 and earlier. CTA 06/20/2016 FINDINGS: Portable AP semi upright view at 0754 hours. Stable enteric tube which courses to the abdomen. Stable right PICC line. Mildly larger lung volumes and decreased streaky bibasilar opacity. Mild residual. No pneumothorax or pulmonary edema. No pleural effusion or consolidation. Stable cardiac size and mediastinal contours. IMPRESSION: 1.  Stable lines and tubes. 2. Mildly improved lung volumes and regressed bibasilar opacity favored to be atelectasis. Electronically Signed   By: Genevie Ann M.D.   On: 06/23/2016 08:19   Dg Chest Port 1 View  Result Date: 06/22/2016 CLINICAL DATA:  61 year old female with history of abdominal aortic aneurysm. Shortness of breath and vomiting. EXAM: PORTABLE CHEST 1 VIEW COMPARISON:  Chest x-ray 06/21/2016. FINDINGS: There is a right upper extremity PICC with tip terminating in the superior cavoatrial junction. A nasogastric tube is seen extending into the stomach, however, the tip of the nasogastric tube extends below the lower margin of the image. Lung volumes are low. Linear bibasilar opacities favored to reflect areas of subsegmental atelectasis, however, underlying airspace consolidation from infection or aspiration is not excluded. No pleural effusions. No evidence of pulmonary edema. Heart size is normal. Upper mediastinal contours are within normal limits. IMPRESSION: 1. Support apparatus, as above. 2. Low lung volumes with bibasilar areas of atelectasis and/or airspace consolidation. Electronically Signed   By: Vinnie Langton M.D.   On: 06/22/2016 07:30   Dg Chest Port 1 View  Result Date:  06/21/2016 CLINICAL DATA:  Aortic dissection EXAM: PORTABLE CHEST 1 VIEW COMPARISON:  06/20/2016 FINDINGS: Bibasilar atelectasis. Heart is normal size. No effusions or acute bony abnormality. IMPRESSION: Bibasilar atelectasis. Electronically Signed   By: Rolm Baptise M.D.   On: 06/21/2016 07:18   Dg Chest Port 1 View  Result Date: 06/20/2016 CLINICAL DATA:  Hypoxia,  unresponsive EXAM: PORTABLE CHEST 1 VIEW COMPARISON:  CTA chest dated 06/20/2016 FINDINGS: Lungs are essentially clear. No focal consolidation. No pleural effusion or pneumothorax. The heart is top-normal in size. IMPRESSION: No evidence of acute cardiopulmonary disease. Electronically Signed   By: Julian Hy M.D.   On: 06/20/2016 17:01   Dg Chest Port 1 View  Result Date: 06/20/2016 CLINICAL DATA:  Sudden onset sharp substernal chest pain that radiates to the back. Nausea, dizziness and shortness of breath. EXAM: PORTABLE CHEST 1 VIEW COMPARISON:  None. FINDINGS: Trachea is midline. Heart size is accentuated by AP supine technique. Probable mild scarring in the right middle lobe and lingula. Lungs are otherwise clear. No pleural fluid. IMPRESSION: No acute findings. Electronically Signed   By: Lorin Picket M.D.   On: 06/20/2016 09:56   Dg Abd Portable 1v  Result Date: 07/17/2016 CLINICAL DATA:  Follow-up ileus EXAM: PORTABLE ABDOMEN - 1 VIEW COMPARISON:  07/16/2016 FINDINGS: Scattered large and small bowel gas is noted. Small bowel dilatation is again identified and stable. No free air is seen. No abnormal mass is noted. Changes of prior cholecystectomy are seen. No bony abnormality is noted. IMPRESSION: Stable distension of the small bowel. Continued follow-up is recommended. Electronically Signed   By: Inez Catalina M.D.   On: 07/17/2016 07:52   Dg Abd Portable 1v  Result Date: 07/16/2016 CLINICAL DATA:  Abdominal pain and distention. EXAM: PORTABLE ABDOMEN - 1 VIEW COMPARISON:  Single-view of the abdomen 07/13/2016 and  07/11/2016. FINDINGS: NG tube is no longer visualized. There is unchanged gaseous distention of small and large bowel. IMPRESSION: NG tube is no longer visualized. No change in gas distention of bowel suggestive of ileus. Electronically Signed   By: Inge Rise M.D.   On: 07/16/2016 07:53   Dg Abd Portable 1v  Result Date: 07/13/2016 CLINICAL DATA:  Initial evaluation for aortic dissection, abdominal distension. EXAM: PORTABLE ABDOMEN - 1 VIEW COMPARISON:  Prior radiograph from 07/11/2016. FINDINGS: Enteric to is scored within the stomach. Cholecystectomy clips noted. There are persistent scattered gas-filled loops of bowel within the abdomen. Overall volume gas is slightly decreased from prior, although the bowel gas pattern has not significantly changed. IMPRESSION: 1. Persistent gas-filled loops of small and large bowel within the abdomen, suggesting small and large bowel ileus. Overall, degree of gaseous distention is slightly improved from previous. 2. NG tube coiled within the stomach. Electronically Signed   By: Jeannine Boga M.D.   On: 07/13/2016 06:39   Dg Abd Portable 1v  Result Date: 07/11/2016 CLINICAL DATA:  61 year old female with type B aortic dissection, left renal infarcts, and bowel ileus. EXAM: PORTABLE ABDOMEN - 1 VIEW COMPARISON:  07/10/2016, CT Abdomen and Pelvis 07/07/2016, and earlier FINDINGS: KUB view of the abdomen at 0528 hours. NG tube looped in the stomach. Stable cholecystectomy clips. Continued gas-filled prominent large and small bowel loops throughout the abdomen. Mildly increased volume of bowel gas but otherwise the bowel gas pattern has not changed since 07/07/2016 where air-fluid levels were demonstrated by CT in both large and small bowel. IMPRESSION: 1. Stable bowel gas pattern since 07/07/2016 suggesting small and large bowel ileus. 2. Stable NG tube in the stomach. Electronically Signed   By: Genevie Ann M.D.   On: 07/11/2016 07:29   Dg Abd Portable  1v  Result Date: 07/10/2016 CLINICAL DATA:  Ileus.  Aortic dissection. EXAM: PORTABLE ABDOMEN - 1 VIEW COMPARISON:  CT scan July 07, 2016.  KUB July 08, 2016 FINDINGS:  Persistent small bowel dilatation measuring up to 4.5 cm, mildly more prominent in the interval. The transverse colon is also air-filled but nondilated. The descending colon is relatively decompressed. An NG tube terminates in the stomach. No free air, portal venous gas, or pneumatosis on supine imaging. IMPRESSION: 1. The dilated small bowel loops are mildly more prominent in the interval. Electronically Signed   By: Dorise Bullion III M.D   On: 07/10/2016 07:28   Dg Abd Portable 1v  Result Date: 07/08/2016 CLINICAL DATA:  Abdominal distention EXAM: PORTABLE ABDOMEN - 1 VIEW COMPARISON:  July 07, 2016 FINDINGS: There may be slightly less bowel dilatation compared to 1 day prior. No air-fluid levels evident. No free air. Nasogastric tube tip and side port remain in stomach. IMPRESSION: Probable ileus with marginally less bowel dilatation compared to 1 day prior. No free air. Nasogastric tube tip and side port in stomach. Electronically Signed   By: Lowella Grip III M.D.   On: 07/08/2016 08:00   Dg Abd Portable 1v  Result Date: 07/07/2016 CLINICAL DATA:  Feeding tube placement. EXAM: PORTABLE ABDOMEN - 1 VIEW COMPARISON:  07/06/2016 FINDINGS: Interval placement of enteric tube which enters the stomach where it is coiled once and has tip and side-port over the gastric fundus just below the left hemidiaphragm. Air-filled loops of large and small bowel with dilated small bowel loop in the left abdomen measuring 5.7 cm in diameter as findings are not significantly changed. Remainder of the exam is unchanged. IMPRESSION: Persistent air-filled dilated small bowel with air throughout the colon. Nasogastric tube coiled once within the stomach with tip and side-port over the gastric fundus just below the left hemidiaphragm. Electronically Signed   By:  Marin Olp M.D.   On: 07/07/2016 19:16   Dg Abd Portable 1v  Result Date: 07/06/2016 CLINICAL DATA:  Multiple episodes of vomiting, abdominal pain and distention. EXAM: PORTABLE ABDOMEN - 1 VIEW COMPARISON:  07/04/2016 FINDINGS: There is persistent dilation and wall edema of small bowel loops, with featureless appearance of some of the visualized small bowel loops. No radiographic evidence of organomegaly or free gas on this limited supine radiograph. IMPRESSION: Persistent dilation/wall edema and featureless appearance of small bowel loops in a nonspecific pattern. These findings may be seen with ileus, incomplete small bowel obstruction or enteritis. Ischemic enteritis continues to be of consideration. Electronically Signed   By: Fidela Salisbury M.D.   On: 07/06/2016 16:14   Dg Abd Portable 1v  Result Date: 07/04/2016 CLINICAL DATA:  Aortic dissection. EXAM: PORTABLE ABDOMEN - 1 VIEW COMPARISON:  07/02/2016 FINDINGS: Nasogastric tube tip in the region of the antrum/pylorus. Persistent group of dilated small bowel loops in the left mid abdomen with possible bowel edema. Possibility of bowel ischemia certainly does exist. Similar appearance to the study of 07/02/2016. IMPRESSION: Nasogastric tube in place. Otherwise similar appearance with abnormal small bowel pattern in the left central abdomen which could go along with partial small bowel obstruction or a focal enteritis including ischemic. Electronically Signed   By: Nelson Chimes M.D.   On: 07/04/2016 08:13   Dg Abd Portable 1v  Result Date: 07/02/2016 CLINICAL DATA:  Nasogastric tube removed.  Acute abdominal pain. EXAM: PORTABLE ABDOMEN - 1 VIEW COMPARISON:  06/27/2016 FINDINGS: Nasogastric tube is been removed. There is a group of dilated fluid and air-filled loops of small intestine in the left abdomen with apparent wall thickening. This could be due to partial obstruction or enteritis, including ischemic bowel insult. IMPRESSION: Nasogastric  tube removed. Abnormal dilated small intestine in the left central abdomen. Differential diagnosis partial small bowel obstruction versus enteritis, including ischemic. Electronically Signed   By: Nelson Chimes M.D.   On: 07/02/2016 07:37   Dg Abd Portable 1v  Result Date: 06/27/2016 CLINICAL DATA:  Ileus. EXAM: PORTABLE ABDOMEN - 1 VIEW COMPARISON:  Jun 26, 2016 FINDINGS: The NG tube terminates within the stomach. The bowel gas pattern is unremarkable on today's study. No other acute abnormalities. IMPRESSION: The NG tube terminates within the stomach. The bowel gas pattern is unremarkable. Electronically Signed   By: Dorise Bullion III M.D   On: 06/27/2016 07:19   Dg Abd Portable 1v  Result Date: 06/26/2016 CLINICAL DATA:  Shortness of breath, abdominal pain EXAM: PORTABLE ABDOMEN - 1 VIEW COMPARISON:  06/25/2016 FINDINGS: NG tube tip is in the distal stomach. Prior cholecystectomy. Nonobstructive bowel gas pattern. No free air organomegaly. IMPRESSION: NG tube tip in the distal stomach.  No acute findings. Electronically Signed   By: Rolm Baptise M.D.   On: 06/26/2016 07:36   Dg Abd Portable 1v  Result Date: 06/25/2016 CLINICAL DATA:  Shortness of breath.  Abdominal pain. EXAM: PORTABLE ABDOMEN - 1 VIEW COMPARISON:  Jun 24, 2016 FINDINGS: The distal tip of the NG tube is near the gastric antrum. Mild opacity in left lung base will be better assessed on today's chest x-ray. No free air or portal venous gas identified although evaluation is limited on supine imaging. No bowel obstruction. IMPRESSION: No interval change or acute abnormality in the abdomen. Electronically Signed   By: Dorise Bullion III M.D   On: 06/25/2016 07:54   Dg Abd Portable 1v  Result Date: 06/24/2016 CLINICAL DATA:  Abdominal distention. EXAM: PORTABLE ABDOMEN - 1 VIEW COMPARISON:  06/23/2016 . FINDINGS: NG tube noted in stable position with tip in the stomach . Surgical clips right upper quadrant. Several nonspecific loops  of air-filled small bowel again noted. Colonic gas pattern is normal. No free air. IMPRESSION: 1.  NG tube in stable position. 2. Several nonspecific loops of air-filled small bowel again noted. No evidence of progressive bowel distention. Colonic gas pattern normal. No free air. Electronically Signed   By: Marcello Moores  Register   On: 06/24/2016 08:06   Dg Abd Portable 1v  Result Date: 06/23/2016 CLINICAL DATA:  61 year old female with shortness of breath and abdominal pain. 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 Being treated conservatively at this time, including with bowel rest. EXAM: PORTABLE ABDOMEN - 1 VIEW COMPARISON:  Abdominal radiographs 06/22/2016. CTA 06/20/2016, and earlier. FINDINGS: Portable AP supine view at 0759 hours. Stable NG tube, side hole to level of the gastric body and tip at the level of the gastric antrum. Stable cholecystectomy clips. Stable bowel gas pattern with several gas containing nondilated small and large bowel loops. No definite pneumoperitoneum on this supine view. Abdominal and pelvic visceral contours appear stable. No acute osseous abnormality identified. IMPRESSION: 1. Stable NG tube position. 2. Stable, nonobstructed bowel-gas pattern. Electronically Signed   By: Genevie Ann M.D.   On: 06/23/2016 08:18   Dg Abd Portable 1v  Result Date: 06/22/2016 CLINICAL DATA:  Abdominal pain for few days with nausea and vomiting. EXAM: PORTABLE ABDOMEN - 1 VIEW COMPARISON:  06/22/2016 abdominal radiograph. FINDINGS: Enteric tube loops in the gastric fundus and terminates in the body of the stomach, without appreciable kink. Top-normal caliber small  bowel loops throughout the abdomen. Minimal colonic stool. No evidence of pneumatosis or pneumoperitoneum. No radiopaque urolithiasis. Cholecystectomy clips are seen in the right upper quadrant of the abdomen. Patchy opacities at  the lung bases. IMPRESSION: 1. Enteric tube loops in the gastric fundus and terminates in the body of the stomach without appreciable kink. 2. Top-normal caliber small bowel loops, unchanged. 3. Patchy bibasilar lung opacities, correlate with chest radiograph. Electronically Signed   By: Ilona Sorrel M.D.   On: 06/22/2016 16:58   Dg Abd Portable 1v  Result Date: 06/22/2016 CLINICAL DATA:  Vomiting.  Shortness of breath.  AAA . EXAM: PORTABLE ABDOMEN - 1 VIEW COMPARISON:  06/21/2016. FINDINGS: Surgical clips right upper quadrant. NG tube noted with tip over the distal stomach. No bowel distention. No acute bony abnormality identified. Mild basilar atelectasis. IMPRESSION: NG tube noted with its tip over the distal stomach. No bowel distention or acute intra-abdominal abnormality identified. Electronically Signed   By: Marcello Moores  Register   On: 06/22/2016 07:30   Dg Abd Portable 1v  Result Date: 06/21/2016 CLINICAL DATA:  Feeding tube placement EXAM: PORTABLE ABDOMEN - 1 VIEW COMPARISON:  06/21/2016 FINDINGS: NG tube coils in the fundus of the stomach with the tip in the distal stomach. Decompression of the stomach. IMPRESSION: NG tube tip in the distal stomach. Electronically Signed   By: Rolm Baptise M.D.   On: 06/21/2016 15:46   Dg Abd Portable 1v  Result Date: 06/21/2016 CLINICAL DATA:  Ileus  Vomiting that started this AM per patient EXAM: PORTABLE ABDOMEN - 1 VIEW COMPARISON:  06/20/2016 FINDINGS: Gaseous distention of the stomach. Paucity of small bowel and colonic gas. Cholecystectomy clips. Linear scarring/ atelectasis in the lung bases. IMPRESSION: 1. Gaseous distention of the stomach. Electronically Signed   By: Lucrezia Europe M.D.   On: 06/21/2016 11:54   Dg Abd Portable 1v  Result Date: 06/20/2016 CLINICAL DATA:  Aortic dissection EXAM: PORTABLE ABDOMEN - 1 VIEW COMPARISON:  CT abdomen/ pelvis dated 06/20/2016 FINDINGS: Nonobstructive bowel gas pattern. Visualized osseous structures are within  normal limits. IMPRESSION: Unremarkable abdominal radiograph. Electronically Signed   By: Julian Hy M.D.   On: 06/20/2016 17:02   Ct Angio Chest/abd/pel For Dissection W And/or W/wo  Result Date: 06/27/2016 CLINICAL DATA:  61 year old female with a history of type B dissection EXAM: CT ANGIOGRAPHY CHEST, ABDOMEN AND PELVIS TECHNIQUE: Multidetector CT imaging through the chest, abdomen and pelvis was performed using the standard protocol during bolus administration of intravenous contrast. Multiplanar reconstructed images and MIPs were obtained and reviewed to evaluate the vascular anatomy. CONTRAST:  100 cc Isovue 370 COMPARISON:  CT 06/20/2016 FINDINGS: CTA CHEST FINDINGS Cardiovascular: Heart: Heart size unchanged. No pericardial fluid/ thickening. No significant coronary calcifications. Aorta: Re- demonstration of type B dissection with the entry tear appearing to originate just beyond the origin of the left subclavian artery. Branch vessels remain patent without extension of the dissection flap into the branch vessels. Caliber and contour of the ascending aorta unremarkable without evidence of retrograde extension. Greatest diameter of the ascending aorta 2.9 cm. Inflammatory changes surrounding the distal aortic arch in the proximal descending aorta. Greatest diameter of the distal aortic arch measures approximately 3.4 cm. Greatest diameter on the comparison CT approximately 3.0 cm. No aneurysm of the descending thoracic aorta with the greatest diameter of the false lumen measuring 2.7 cm. True lumen is compressed. There is a fenestration in the distal true lumen at the level of the diaphragm just above  the aortic hiatus. Pulmonary arteries: No lobar, segmental, or proximal subsegmental filling defects. Mediastinum/Nodes: No mediastinal hemorrhage. Mediastinal lymph nodes are present. Gastric tube within the esophagus. Lungs/Pleura: Ground-glass opacities developing through the the bilateral lungs.  Developing interlobular septal thickening. Atelectasis of the medial segment left lower lobe. Small low-density left pleural effusion trace right-sided pleural effusion and associated atelectasis. No pneumothorax. Right upper extremity PICC appears to terminate superior vena cava. Review of the MIP images confirms the above findings. CTA ABDOMEN AND PELVIS FINDINGS VASCULAR Aorta: Re- demonstration of dissection flap of the abdominal aorta. No aneurysm.  No periaortic fluid of the abdomen. The true lumen is compressed throughout the abdomen. True lumen contributes to celiac artery origin, superior mesenteric artery origin, and also continues across the origin of the inferior mesenteric artery. The false lumen contributes to perfusion of the right renal artery. The lateral margin of the dissection flap involves the origin of 2 left renal arteries both superior and inferior. True lumen is decompressed in the inferior aorta extending into the bilateral iliac arteries. The bilateral common iliac arteries appear perfused from the false lumen bilaterally. False lumen extends in the bilateral external iliac artery. Likely re- entry tear of distal external iliac arteries bilaterally, on the right image 173, on the left image 171. Dissection flap extends into the bilateral hypogastric arteries which are partially patent with decreased flow. Celiac: Origin of the celiac artery originates from the true lumen which is decompressed. Celiac artery remains patent at this time including the branch vessels. Typical branch pattern of splenic artery, left gastric artery, common hepatic artery. SMA: Superior mesenteric artery origin originates from the true lumen which is compressed. SMA remains patent at this time. Renals: Right renal artery originates from the false lumen. Right renal artery is perfusing at this time with uniform perfusion of the right kidney. There are 2 left renal arteries, superior and inferior. Both arteries  originate near the margin of the dissection flap. The superior left renal artery is partially filling, at the in flexion point of the dissection flap, perfusing superior kidney. The inferior left renal artery appears thrombosed, new from the comparison with enlarging left renal infarction, predominantly of the lower pole. IMA: Origin of the inferior mesenteric artery is thrombosed given the collapsed true lumen at this level. There is re- constitution of the distal inferior mesenteric artery via collateral flow. Right lower extremity: False lumen perfuses the right common iliac artery with collapse of the true lumen. Dissection flap extends into the hypogastric artery, with the distal pelvic branch is partially opacifying. External iliac artery is perfusing from the false lumen, with unremarkable appearance of the distal external iliac artery, common femoral artery, profunda femoris, SFA. There is the appearance of a re- entry tear in the mid right external iliac artery. Left lower extremity: False lumen perfuses the left common iliac artery with collapse of the true lumen. Dissection extends into the hypogastric artery which is partially perfused with opacification of pelvic vessels. Distal external iliac artery an the common femoral artery unremarkable, with patent proximal femoral vessels. There is the appearance of a re- entry tear in the mid left external iliac artery. Veins: Unremarkable appearance of the venous system. Review of the MIP images confirms the above findings. NON-VASCULAR Hepatobiliary: Unremarkable appearance of the liver. Cholecystectomy Pancreas: Unremarkable appearance of the pancreas. No pericholecystic fluid or inflammatory changes. Unremarkable ductal system. Spleen: Unremarkable. Adrenals/Urinary Tract: Right adrenal gland unremarkable. Nodule of the left adrenal gland which measures 2.7 cm.  Right: Right-sided kidney perfuses uniformly with no hydronephrosis. Left: Progressive left renal  infarct with small portion of the superior kidney perfusing. The remainder of the left kidney is hypoperfused, progressed from the comparison. No hydronephrosis. Urinary catheter within the urinary bladder. Stomach/Bowel: Unremarkable appearance of stomach with gastric tube in place. Small bowel borderline dilated and fluid-filled. No transition point. The wall of the small bowel relatively uniformly thin and enhancing, although the study is timed for the arterial phase. No focal wall thickening. No abnormally distended colon. No transition point. Fluid filled colon. No focal wall thickening or pericolonic inflammatory changes. Lymphatic: Multiple lymph nodes in the para-aortic nodal station, none of which are enlarged. Mesenteric: No free fluid or air. No adenopathy. Reproductive: Hysterectomy Other: No hernia. Musculoskeletal: No displaced fracture. Degenerative changes of the spine. 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 Only 1 possible fenestration is identified, in the distal thoracic aorta above the aortic hiatus. There is enlarging diameter of the distal aortic arch with associated inflammatory changes, measuring approximately 3.5 on today's study compared to approximately 3.1 cm previously. The appearance is concerning for progression/expansion of intramural hematoma. Progressing left renal infarction, with near complete thrombosis of superior and inferior renal arteries, both of which originate at the margin of the dissection flap. Bilateral common iliac arteries appear to be perfused from the false lumen, with complete collapse of true lumen proximally. There does appear to be re- entry to the true lumen in the mid external iliac artery bilaterally, as there is unremarkable appearance of the proximal femoral vasculature including bilateral common iliac arteries. The 3 mesenteric vessels originate from the small true lumen,  which is progressively compressed. Celiac artery and superior mesenteric artery remain patent, with occlusion at the origin of the inferior mesenteric artery. Three vessel arch, with all 3 branches remain patent. No evidence of extension of the dissection flap into the branch vessels. These preliminary results were discussed by telephone at the time of interpretation on 06/27/2016 at 12:41 pm with Dr. Curt Jews. Borderline dilated small bowel without transition point or obstruction. Findings may represent ileus and/or nonspecific enteritis. No focal wall thickening to suggest Delton Stelle ischemia, although the timing of this CTA is specific for arterial evaluation and not the bowel tissues. If ongoing concern for bowel ischemia, would repeat abd/pelvis contrast enhanced-CT portal venous phase. Similar appearance of mixed geographic ground-glass opacities of the bilateral lungs with mild interlobular septal thickening. Again, differential diagnosis includes pulmonary edema, ARDS, atypical infection. Signed, Dulcy Fanny. Earleen Newport, DO [facility] Electronically Signed   By: Corrie Mckusick D.O.   On: 06/27/2016 13:18   Ct Angio Chest/abd/pel For Dissection W And/or W/wo  Result Date: 06/20/2016 CLINICAL DATA:  Substernal chest pain radiating to back EXAM: CT ANGIOGRAPHY CHEST, ABDOMEN AND PELVIS TECHNIQUE: Multidetector CT imaging through the chest, abdomen and pelvis was performed using the standard protocol during bolus administration of intravenous contrast. Multiplanar reconstructed images and MIPs were obtained and reviewed to evaluate the vascular anatomy. CONTRAST:  100 cc Isovue 370 IV COMPARISON:  CT abdomen and pelvis 10/27/2011 FINDINGS: CTA CHEST FINDINGS Cardiovascular: There is the type B aortic dissection beginning in the distal aortic arch just beyond the origin of the great vessels. The true lumen is compressed by the false lumen. No aneurysm. No  pulmonary embolus. Heart is normal size. Mediastinum/Nodes: No mediastinal, hilar, or axillary adenopathy. Lungs/Pleura: Atelectasis or scarring in the  lingula. Lungs otherwise clear. No effusions. Musculoskeletal: No acute bony abnormality. Review of the MIP images confirms the above findings. CTA ABDOMEN AND PELVIS FINDINGS VASCULAR Aorta: Dissection continues throughout the abdominal aorta with the celiac artery and superior mesenteric artery arising from the true lumen anteriorly. The dissection may extend into the left renal artery where there is only a small amount of blood flow noted in the proximal and mid left renal artery. Right renal artery appears to arise from the false lumen and is patent. The the true lumen appears thrombosed below the origin of the superior mesenteric artery. Inferior mesenteric artery arises from the thrombosed true lumen and appears occluded proximally, reconstitutes several cm from the origin. Celiac: Patent. SMA: Patent. Renals: As above. IMA: As above. Inflow: To the dissection continues into both common iliac arteries with thrombosed true lumens. The dissection appears to terminate at approximately the level of the common iliac bifurcation bilaterally. Veins: Grossly patent and unremarkable. Review of the MIP images confirms the above findings. NON-VASCULAR Hepatobiliary: Mild diffuse fatty infiltration. Prior cholecystectomy. Pancreas: No focal abnormality or ductal dilatation. Spleen: No focal abnormality.  Normal size. Adrenals/Urinary Tract: Nodules in the left adrenal gland are low-density on the precontrast imaging compatible with small adenomas. There are areas of non perfusion noted in the mid and lower poles of the left kidney compatible with infarction, likely related to the involvement of the left renal artery by dissection. Urinary bladder unremarkable. Stomach/Bowel: Stomach, large and small bowel grossly unremarkable. Lymphatic: No adenopathy. Reproductive: Prior  hysterectomy.  No adnexal masses. Other: No free fluid or free air. Musculoskeletal: No acute bony abnormality. Review of the MIP images confirms the above findings. 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 This involves the descending thoracic aorta, abdominal aorta and iliac vessels. The true lumen is compressed by the larger false lumen, and is thrombosed below the SMA/renal artery origins. The dissection appears to extend into the left renal artery with partial occlusion and areas of infarct in the mid and lower pole of the left kidney. Fatty infiltration of the liver. Critical Value/emergent results were called by telephone at the time of interpretation on 06/20/2016 at 10:13 am to Dr. Fredia Sorrow , who verbally acknowledged these results. Electronically Signed   By: Rolm Baptise M.D.   On: 06/20/2016 10:15   Anti-infectives: Anti-infectives    Start     Dose/Rate Route Frequency Ordered Stop   07/18/16 0000  levofloxacin (LEVAQUIN) IVPB 500 mg  Status:  Discontinued     500 mg 100 mL/hr over 60 Minutes Intravenous Every 24 hours 07/17/16 0903 07/17/16 0938   07/17/16 1100  vancomycin (VANCOCIN) 1,500 mg in sodium chloride 0.9 % 500 mL IVPB     1,500 mg 250 mL/hr over 120 Minutes Intravenous Every 24 hours 07/17/16 0946 07/24/16 1059   07/17/16 1000  levofloxacin (LEVAQUIN) tablet 500 mg  Status:  Discontinued     500 mg Oral Daily 07/17/16 0851 07/17/16 0903   07/15/16 0900  ciprofloxacin (CIPRO) tablet 500 mg  Status:  Discontinued     500 mg Oral 2 times daily 07/15/16 0828 07/17/16 0851   07/14/16 1115  levofloxacin (LEVAQUIN) tablet 500 mg  Status:  Discontinued     500 mg Oral Daily 07/14/16 1107 07/14/16 1214   06/26/16 0830  meropenem (MERREM) 1 g in sodium chloride 0.9 % 100 mL IVPB  Status:  Discontinued  1 g 200 mL/hr over 30 Minutes Intravenous Every 8 hours 06/26/16 0810 07/06/16 0804   06/22/16 1800   clindamycin (CLEOCIN) IVPB 600 mg  Status:  Discontinued     600 mg 100 mL/hr over 30 Minutes Intravenous Every 8 hours 06/22/16 1723 06/26/16 0810   06/22/16 1000  azithromycin (ZITHROMAX) 250 mg in dextrose 5 % 125 mL IVPB  Status:  Discontinued     250 mg 125 mL/hr over 60 Minutes Intravenous Every 24 hours 06/22/16 0826 06/27/16 1019   06/22/16 0900  levofloxacin (LEVAQUIN) IVPB 500 mg  Status:  Discontinued     500 mg 100 mL/hr over 60 Minutes Intravenous Every 24 hours 06/22/16 0759 06/22/16 0824      Assessment/Plan: s/p * No surgery found * No white count elevation. CT of abdomen pending.   LOS: 27 days   EarlySherren Mocha 07/17/2016, 5:59 PM

## 2016-07-17 NOTE — Progress Notes (Signed)
Pharmacy Antibiotic Note  Haley Graham is a 61 y.o. female with enterococcal UTI and noted with a PCN allergy.  Pharmacy consulted to dose vancomycin. Avoiding flouroquinolones due to aortic dissection and Macrobid due to SCr trend up. Plans to treat for 7 days -WBC= 9.2, tmax= 101.4, SCr= 1.21, CrCl ~ 50   Plan: -Vancomycin 1500mg  IV Q24h (total duration of 7 days) -Will follow renal function, cultures and clinical progress -Vancomycin level at steady state  Height: 5\' 3"  (160 cm) Weight: 181 lb (82.1 kg) IBW/kg (Calculated) : 52.4  Temp (24hrs), Avg:99.5 F (37.5 C), Min:98.5 F (36.9 C), Max:101.4 F (38.6 C)   Recent Labs Lab 07/13/16 0500 07/14/16 0510 07/15/16 0409 07/16/16 0849 07/16/16 1428 07/17/16 0458 07/17/16 0630  WBC 9.8  --   --   --  8.5 9.2  --   CREATININE 1.10* 1.03* 0.99 1.11*  --   --  1.21*  LATICACIDVEN  --   --   --   --  1.7  --   --     Estimated Creatinine Clearance: 50.8 mL/min (A) (by C-G formula based on SCr of 1.21 mg/dL (H)).    Allergies  Allergen Reactions  . Ativan [Lorazepam] Other (See Comments)    Makes pt very confused and more agitated, irritable.  . Codeine Nausea And Vomiting  . Reglan [Metoclopramide] Other (See Comments)    Heart races  . Penicillins Rash    Has patient had a PCN reaction causing immediate rash, facial/tongue/throat swelling, SOB or lightheadedness with hypotension: yes  Has patient had a PCN reaction causing severe rash involving mucus membranes or skin necrosis: no Has patient had a PCN reaction that required hospitalization no Has patient had a PCN reaction occurring within the last 10 years:no If all of the above answers are "NO", then may proceed with Cephalosporin use.    Antimicrobials this admission: 6/15 vanc>> 6/13 cipro>>6/15  Dose adjustments this admission:   Microbiology results: 6/9 urine: enterococcus (> 100K). Pan-S 6/13 urine: enterococcus (> 100K). Pan-S  Thank you for  allowing pharmacy to be a part of this patient's care.  Hildred Laser, Pharm D 07/17/2016 9:43 AM

## 2016-07-17 NOTE — Progress Notes (Signed)
PHARMACY - ADULT TOTAL PARENTERAL NUTRITION CONSULT NOTE   Pharmacy Consult:  TPN Indication:  Prolonged NPO status/high NG output   Patient Measurements: Height: 5\' 3"  (160 cm) Weight: 181 lb (82.1 kg) IBW/kg (Calculated) : 52.4 TPN AdjBW (KG): 59.4 Body mass index is 32.06 kg/m. 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 Patient had a significant history of N/V for 2-4 weeks PTA but weight appears to have been maintained. Patient has had several NG tube replacement due to nausea and vomiting.  Pharmacy consulted to manage TPN since 06/23/16.     GI: Ileus vs gastroparesis. 6/5 CT negative for perf/pneumatosis, unresolved SB dilatation. NGT removed 6/11, stool O/P 373mL.  PPI IV, frequent Zofran usage Endo: DM on glipizide/metformin PTA - CBGs controlled (95-146) Insulin requirements in the past 24 hours: 20 units SSI, Lantus 40 units BID + 45 units in TPN Lytes: low Na/CO2, others WNL Renal: SCr 1.21, BUN 30 - NS at 53ml/hr, UOP 0.2 ml/kg/hr Pulm: [place] >> RA. Brovana, Pulmicort Cards: Stanford type B aortic dissection - BP soft, intermittent tachy - Lopressor, clonidine 0.3 mg patch, off IV Lasix Hepatobil: ALT/ALT bumped 103/172, tbili up 1.6, TG WNL - TPN is balanced. Neuro: Celexa, Klonopin, xanax prn, naproxen prn, apap prn, hydroxyzine prn. Fentanyl/Dialudid prn - weaning narcotics to help with ileus.  ID: Cipro D#3 (6/13 >> ) for E.faecalis UTI.  S/p Merrem (5/25-6/4) for respiratory failure from PNA. C.diff negative.  Tmax 100.6, WBC WNL.  6/14 BCx pending Best Practices: Lovenox 30mg , CHG, MC TPN Access: PICC placed 06/21/16 TPN start date: 06/23/16  Nutritional Goals (per RD recommendation on 5/25): 2000-2200 kCal and 110-120 gm protein  Current Nutrition:   TPN Boost BID (received none yesterday) - each provides 9g protein + 250kcal   Plan:  - Attempt cyclic TPN given elevation in LFTs.   Cyclic Clinimix E 8/52, infuse 1990 mls over 20 hrs:  50 ml/hr x 1 hr, then 105 ml/hr x 18 hrs, then 50 ml/hr x 1 hr.  Challenging with current insulin requirement. - Continue 20% ILE at 20 ml/hr x 12 hrs - TPN provides 1893 kCal and 100gm of protein per day, meeting > 90% of total needs  - Daily multivitamin in TPN - Trace elements in TPN every other day d/t shortage, next today - Continue TCTS SSI Q4H + 45 units regular insulin in TPN.  Reduce Lantus to 35 units BID to avoid tight glycemic control.  Watch CBGs closely with cyclic TPN. - Consider changing to Levaquin - F/U tolerance to reduce TPN cycle further   Haley Graham, PharmD, BCPS Pager:  [phone] 07/17/2016, 9:58 AM

## 2016-07-17 NOTE — Progress Notes (Signed)
Subjective/Chief Complaint: Slightly less pain with NPO status.    Objective: Vital signs in last 24 hours: Temp:  [98.5 F (36.9 C)-101.4 F (38.6 C)] 98.6 F (37 C) (06/15 0300) Pulse Rate:  [93-114] 99 (06/15 0500) Resp:  [14-37] 14 (06/15 0600) BP: (76-121)/(34-71) 109/47 (06/15 0600) SpO2:  [93 %-100 %] 96 % (06/15 0500) Weight:  [82.1 kg (181 lb)] 82.1 kg (181 lb) (06/15 0500) Last BM Date: 07/16/16  Intake/Output from previous day: 06/14 0701 - 06/15 0700 In: 2049.5 [P.O.:60; I.V.:1639.5; IV Piggyback:350] Out: 750 [Urine:400; Stool:350] Intake/Output this shift: No intake/output data recorded.  General appearance: sleeping, arousable.  Mild distress.   Resp: breathing remains slightly labored.   Cardio: regular rate and rhythm GI: soft, less tender.  Lab Results:   Recent Labs  07/16/16 1428 07/17/16 0458  WBC 8.5 9.2  HGB 7.8* 8.2*  HCT 23.4* 25.4*  PLT 148* 152   BMET  Recent Labs  07/16/16 0849 07/17/16 0630  NA 130* 132*  K 4.1 4.2  CL 107 109  CO2 17* 18*  GLUCOSE 136* 95  BUN 32* 30*  CREATININE 1.11* 1.21*  CALCIUM 8.1* 8.0*   PT/INR No results for input(s): LABPROT, INR in the last 72 hours. ABG No results for input(s): PHART, HCO3 in the last 72 hours.  Invalid input(s): PCO2, PO2  Studies/Results: Dg Abd Portable 1v  Result Date: 07/17/2016 CLINICAL DATA:  Follow-up ileus EXAM: PORTABLE ABDOMEN - 1 VIEW COMPARISON:  07/16/2016 FINDINGS: Scattered large and small bowel gas is noted. Small bowel dilatation is again identified and stable. No free air is seen. No abnormal mass is noted. Changes of prior cholecystectomy are seen. No bony abnormality is noted. IMPRESSION: Stable distension of the small bowel. Continued follow-up is recommended. Electronically Signed   By: Inez Catalina M.D.   On: 07/17/2016 07:52   Dg Abd Portable 1v  Result Date: 07/16/2016 CLINICAL DATA:  Abdominal pain and distention. EXAM: PORTABLE ABDOMEN - 1 VIEW  COMPARISON:  Single-view of the abdomen 07/13/2016 and 07/11/2016. FINDINGS: NG tube is no longer visualized. There is unchanged gaseous distention of small and large bowel. IMPRESSION: NG tube is no longer visualized. No change in gas distention of bowel suggestive of ileus. Electronically Signed   By: Inge Rise M.D.   On: 07/16/2016 07:53    Anti-infectives: Anti-infectives    Start     Dose/Rate Route Frequency Ordered Stop   07/15/16 0900  ciprofloxacin (CIPRO) tablet 500 mg     500 mg Oral 2 times daily 07/15/16 0828     07/14/16 1115  levofloxacin (LEVAQUIN) tablet 500 mg  Status:  Discontinued     500 mg Oral Daily 07/14/16 1107 07/14/16 1214   06/26/16 0830  meropenem (MERREM) 1 g in sodium chloride 0.9 % 100 mL IVPB  Status:  Discontinued     1 g 200 mL/hr over 30 Minutes Intravenous Every 8 hours 06/26/16 0810 07/06/16 0804   06/22/16 1800  clindamycin (CLEOCIN) IVPB 600 mg  Status:  Discontinued     600 mg 100 mL/hr over 30 Minutes Intravenous Every 8 hours 06/22/16 1723 06/26/16 0810   06/22/16 1000  azithromycin (ZITHROMAX) 250 mg in dextrose 5 % 125 mL IVPB  Status:  Discontinued     250 mg 125 mL/hr over 60 Minutes Intravenous Every 24 hours 06/22/16 0826 06/27/16 1019   06/22/16 0900  levofloxacin (LEVAQUIN) IVPB 500 mg  Status:  Discontinued     500 mg 100  mL/hr over 60 Minutes Intravenous Every 24 hours 06/22/16 0759 06/22/16 0824     Assessment/Plan: Aortic dissection Intestinal angina Fever last night.    Will get CT scan abd/pelvis.  Given fever and continued cyclic improvement and worsening, will get CTs today.      LOS: 27 days

## 2016-07-17 NOTE — Progress Notes (Signed)
  Subjective: Persistent nausea, abdomen distended, KUB shows persistent dilated loops of large and small bowel and a ileus pattern Abdomen without guarding Bicarbonate level borderline low at 18 but lactic acid levels are not elevated Temperature 101 last p.m.-urine shows enterococcal UTI sensitive to vancomycin. We'll avoid Levaquin because of correlation with aortic dissection Because of fever and findings on KUB patient will have CT scan of abdomen today. Patient discussed with Dr. Barry Dienes at bedside for coordination of care  Objective: Vital signs in last 24 hours: Temp:  [98.5 F (36.9 C)-101.4 F (38.6 C)] 98.6 F (37 C) (06/15 0300) Pulse Rate:  [94-114] 99 (06/15 0500) Cardiac Rhythm: Sinus tachycardia (06/15 0400) Resp:  [14-37] 14 (06/15 0600) BP: (76-117)/(34-71) 109/47 (06/15 0600) SpO2:  [93 %-98 %] 96 % (06/15 0500) Weight:  [181 lb (82.1 kg)] 181 lb (82.1 kg) (06/15 0500)  Hemodynamic parameters for last 24 hours:  sinus  Intake/Output from previous day: 06/14 0701 - 06/15 0700 In: 2049.5 [P.O.:60; I.V.:1639.5; IV Piggyback:350] Out: 750 [Urine:400; Stool:350] Intake/Output this shift: No intake/output data recorded.  Alert but uncomfortable complains of nausea Lungs clear Heart rate regular without murmur Abdomen distended, tympanitic but without guarding Lower extremities warm with palpable pedal pulse  Lab Results:  Recent Labs  07/16/16 1428 07/17/16 0458  WBC 8.5 9.2  HGB 7.8* 8.2*  HCT 23.4* 25.4*  PLT 148* 152   BMET:  Recent Labs  07/16/16 0849 07/17/16 0630  NA 130* 132*  K 4.1 4.2  CL 107 109  CO2 17* 18*  GLUCOSE 136* 95  BUN 32* 30*  CREATININE 1.11* 1.21*  CALCIUM 8.1* 8.0*    PT/INR: No results for input(s): LABPROT, INR in the last 72 hours. 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

## 2016-07-18 ENCOUNTER — Encounter (HOSPITAL_COMMUNITY): Payer: Self-pay | Admitting: *Deleted

## 2016-07-18 ENCOUNTER — Inpatient Hospital Stay (HOSPITAL_COMMUNITY): Payer: BLUE CROSS/BLUE SHIELD

## 2016-07-18 LAB — CBC
HCT: 22.8 % — ABNORMAL LOW (ref 36.0–46.0)
Hemoglobin: 7.7 g/dL — ABNORMAL LOW (ref 12.0–15.0)
MCH: 34.4 pg — ABNORMAL HIGH (ref 26.0–34.0)
MCHC: 33.8 g/dL (ref 30.0–36.0)
MCV: 101.8 fL — ABNORMAL HIGH (ref 78.0–100.0)
Platelets: 148 10*3/uL — ABNORMAL LOW (ref 150–400)
RBC: 2.24 MIL/uL — ABNORMAL LOW (ref 3.87–5.11)
RDW: 16 % — ABNORMAL HIGH (ref 11.5–15.5)
WBC: 9.5 10*3/uL (ref 4.0–10.5)

## 2016-07-18 LAB — COMPREHENSIVE METABOLIC PANEL
ALT: 140 U/L — ABNORMAL HIGH (ref 14–54)
AST: 74 U/L — ABNORMAL HIGH (ref 15–41)
Albumin: 1.6 g/dL — ABNORMAL LOW (ref 3.5–5.0)
Alkaline Phosphatase: 97 U/L (ref 38–126)
Anion gap: 5 (ref 5–15)
BUN: 26 mg/dL — ABNORMAL HIGH (ref 6–20)
CO2: 16 mmol/L — ABNORMAL LOW (ref 22–32)
Calcium: 7.4 mg/dL — ABNORMAL LOW (ref 8.9–10.3)
Chloride: 111 mmol/L (ref 101–111)
Creatinine, Ser: 1.15 mg/dL — ABNORMAL HIGH (ref 0.44–1.00)
GFR calc Af Amer: 59 mL/min — ABNORMAL LOW (ref >60)
GFR calc non Af Amer: 51 mL/min — ABNORMAL LOW (ref >60)
Glucose, Bld: 151 mg/dL — ABNORMAL HIGH (ref 65–99)
Potassium: 4.6 mmol/L (ref 3.5–5.1)
Sodium: 132 mmol/L — ABNORMAL LOW (ref 135–145)
Total Bilirubin: 1.4 mg/dL — ABNORMAL HIGH (ref 0.3–1.2)
Total Protein: 5 g/dL — ABNORMAL LOW (ref 6.5–8.1)

## 2016-07-18 LAB — GLUCOSE, CAPILLARY
Glucose-Capillary: 121 mg/dL — ABNORMAL HIGH (ref 65–99)
Glucose-Capillary: 151 mg/dL — ABNORMAL HIGH (ref 65–99)
Glucose-Capillary: 161 mg/dL — ABNORMAL HIGH (ref 65–99)
Glucose-Capillary: 168 mg/dL — ABNORMAL HIGH (ref 65–99)
Glucose-Capillary: 169 mg/dL — ABNORMAL HIGH (ref 65–99)
Glucose-Capillary: 65 mg/dL (ref 65–99)
Glucose-Capillary: 71 mg/dL (ref 65–99)

## 2016-07-18 MED ORDER — IOPAMIDOL (ISOVUE-300) INJECTION 61%
100.0000 mL | Freq: Once | INTRAVENOUS | Status: AC | PRN
  Administered 2016-07-18: 100 mL via INTRAVENOUS

## 2016-07-18 MED ORDER — FAT EMULSION 20 % IV EMUL
240.0000 mL | INTRAVENOUS | Status: AC
  Administered 2016-07-18: 240 mL via INTRAVENOUS
  Filled 2016-07-18: qty 250

## 2016-07-18 MED ORDER — M.V.I. ADULT IV INJ
INTRAVENOUS | Status: AC
  Administered 2016-07-18: 18:00:00 via INTRAVENOUS
  Filled 2016-07-18: qty 1990

## 2016-07-18 MED ORDER — ALPRAZOLAM 0.5 MG PO TABS
0.5000 mg | ORAL_TABLET | Freq: Three times a day (TID) | ORAL | Status: DC | PRN
  Administered 2016-07-18 – 2016-07-24 (×10): 0.5 mg via ORAL
  Filled 2016-07-18 (×11): qty 1

## 2016-07-18 MED ORDER — IOPAMIDOL (ISOVUE-300) INJECTION 61%
INTRAVENOUS | Status: AC
  Administered 2016-07-18: 30 mL
  Filled 2016-07-18: qty 30

## 2016-07-18 NOTE — Progress Notes (Signed)
Subjective: Interval History: none.. Denies abdominal pain. Seems to be more short of breath than typical but denies this.   Objective: Vital signs in last 24 hours: Temp:  [98 F (36.7 C)-101.2 F (38.4 C)] 98 F (36.7 C) (06/16 0746) Pulse Rate:  [98-112] 112 (06/16 0600) Resp:  [16-29] 25 (06/16 0600) BP: (99-133)/(45-62) 133/58 (06/16 0600) SpO2:  [92 %-96 %] 94 % (06/16 0600) Weight:  [183 lb 13.8 oz (83.4 kg)] 183 lb 13.8 oz (83.4 kg) (06/16 6195)  Intake/Output from previous day: 06/15 0701 - 06/16 0700 In: 3125.7 [P.O.:390; I.V.:2535.7; IV Piggyback:200] Out: -  Intake/Output this shift: No intake/output data recorded.  Abdomen soft and nontender. 3+ dorsalis pedis pulses bilaterally  Lab Results:  Recent Labs  07/17/16 0458 07/18/16 0337  WBC 9.2 9.5  HGB 8.2* 7.7*  HCT 25.4* 22.8*  PLT 152 148*   BMET  Recent Labs  07/17/16 0630 07/18/16 0337  NA 132* 132*  K 4.2 4.6  CL 109 111  CO2 18* 16*  GLUCOSE 95 151*  BUN 30* 26*  CREATININE 1.21* 1.15*  CALCIUM 8.0* 7.4*    Studies/Results: Ct Abdomen Pelvis Wo Contrast  Result Date: 06/26/2016 CLINICAL DATA:  Shortness of breath EXAM: CT CHEST, ABDOMEN AND PELVIS WITHOUT CONTRAST TECHNIQUE: Multidetector CT imaging of the chest, abdomen and pelvis was performed following the standard protocol without IV contrast. COMPARISON:  06/20/2016 chest CT FINDINGS: CT CHEST FINDINGS Cardiovascular: Normal heart size. No pericardial effusion. 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 Mild haziness around the upper descending segment is likely stable. Stable maximal diameter of 33 mm at this level. No intramural hematoma. Intermittently seen displaced intimal calcification. Mediastinum/Nodes: Negative for adenopathy. Right upper extremity PICC with tip at the SVC level. Lungs/Pleura: Multi segment atelectasis, worse on the left. Small pleural effusions. There is patchy ground-glass airspace density  in the bilateral lungs without gradient. Airways are clear and there is no septal thickening. Musculoskeletal: No acute or aggressive finding. CT ABDOMEN PELVIS FINDINGS Hepatobiliary: Hepatic steatosis.Cholecystectomy with normal common bile duct diameter. Pancreas: Unremarkable. Spleen: Unremarkable. 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 The larger measures up to 27 mm and is indeterminate by densitometry, but left adrenal mass has been noted since at least 2006 abdominal CT report, images not available. Left renal cyst. No hydronephrosis or urolithiasis. 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 Negative decompressed urinary bladder. Stomach/Bowel: There is diffuse small bowel distention and fluid filling with mildly hazy mesenteries. Similar features in the colon proximally and at the transverse segment. No pneumatosis or perforation is noted. Nasogastric tube tip is at the pylorus. Vascular/Lymphatic: There is intimal flap displacement from known dissection, morphology appearing similar to prior. No mass or adenopathy. Reproductive:Hysterectomy.  Unremarkable ovaries. Other: No ascites or pneumoperitoneum.  Anasarca. Musculoskeletal: No acute abnormalities. These results were called by telephone at the time of interpretation on 06/26/2016 at 2:05 pm to Dr. Ivin Poot , who verbally acknowledged these results. IMPRESSION: 1. Diffuse airspace disease. Pattern can be seen with noncardiogenic edema (ARDS), inflammatory pneumonitis, or atypical infection. Multi segment atelectasis at the bases and small pleural effusions. 2. Diffuse small bowel and colonic distention with fluid levels as seen with ileus. Question underlying bowel ischemia given the patient's known aortic dissection with proximal IMA occlusion and SMA flow via the narrow true lumen. 3. Incidental findings noted above. Electronically Signed  By:  Monte Fantasia M.D.   On: 06/26/2016 14:05   Ct Chest Wo Contrast  Result Date: 06/26/2016 CLINICAL DATA:  Shortness of breath EXAM: CT CHEST, ABDOMEN AND PELVIS WITHOUT CONTRAST TECHNIQUE: Multidetector CT imaging of the chest, abdomen and pelvis was performed following the standard protocol without IV contrast. COMPARISON:  06/20/2016 chest CT FINDINGS: CT CHEST FINDINGS Cardiovascular: Normal heart size. No pericardial effusion. 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 Mild haziness around the upper descending segment is likely stable. Stable maximal diameter of 33 mm at this level. No intramural hematoma. Intermittently seen displaced intimal calcification. Mediastinum/Nodes: Negative for adenopathy. Right upper extremity PICC with tip at the SVC level. Lungs/Pleura: Multi segment atelectasis, worse on the left. Small pleural effusions. There is patchy ground-glass airspace density in the bilateral lungs without gradient. Airways are clear and there is no septal thickening. Musculoskeletal: No acute or aggressive finding. CT ABDOMEN PELVIS FINDINGS Hepatobiliary: Hepatic steatosis.Cholecystectomy with normal common bile duct diameter. Pancreas: Unremarkable. Spleen: Unremarkable. 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 The larger measures up to 27 mm and is indeterminate by densitometry, but left adrenal mass has been noted since at least 2006 abdominal CT report, images not available. Left renal cyst. No hydronephrosis or urolithiasis. 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 Negative decompressed urinary bladder. Stomach/Bowel: There is diffuse small bowel distention and fluid filling with mildly hazy mesenteries. Similar features in the colon proximally and at the transverse segment. No pneumatosis or perforation is noted. Nasogastric tube tip is at the pylorus. Vascular/Lymphatic:  There is intimal flap displacement from known dissection, morphology appearing similar to prior. No mass or adenopathy. Reproductive:Hysterectomy.  Unremarkable ovaries. Other: No ascites or pneumoperitoneum.  Anasarca. Musculoskeletal: No acute abnormalities. These results were called by telephone at the time of interpretation on 06/26/2016 at 2:05 pm to Dr. Ivin Poot , who verbally acknowledged these results. IMPRESSION: 1. Diffuse airspace disease. Pattern can be seen with noncardiogenic edema (ARDS), inflammatory pneumonitis, or atypical infection. Multi segment atelectasis at the bases and small pleural effusions. 2. Diffuse small bowel and colonic distention with fluid levels as seen with ileus. Question underlying bowel ischemia given the patient's known aortic dissection with proximal IMA occlusion and SMA flow via the narrow true lumen. 3. Incidental findings noted above. Electronically Signed   By: Monte Fantasia M.D.   On: 06/26/2016 14:05   Ct Abdomen Pelvis W Contrast  Result Date: 07/07/2016 CLINICAL DATA:  61 year old female with abdominal pain and distension greater on the right. Subsequent encounter. EXAM: CT ABDOMEN AND PELVIS WITH CONTRAST TECHNIQUE: Multidetector CT imaging of the abdomen and pelvis was performed using the standard protocol following bolus administration of intravenous contrast. CONTRAST:  75 cc Isovue 300. COMPARISON:  07/06/2016 plain film exam. 06/27/2016 CT angiogram chest, abdomen and pelvis. 10/27/2011 FINDINGS: Lower chest: Basilar atelectasis greatest medial left lung base. Heart top-normal size with aortic/mitral valve calcification. Hepatobiliary: Mild fatty infiltration of the liver without worrisome hepatic lesion. Post cholecystectomy. Pancreas: No mass or inflammation.  No duct dilation. Spleen: No mass or enlargement. Adrenals/Urinary Tract: Prominent infarct left kidney. Left renal cyst. Nodularity adrenal glands greater on the left has changed slightly  since 2013 with largest left adrenal nodule currently measuring 2.6 x 2.5 x 1.9 cm versus on 2013 exam 2.4 x 2.3 x 1.9 cm Stomach/Bowel: Fluid and gas-filled prominent size small bowel without  point of transition noted. Fluid-filled featureless colon. Minimal amount of fluid adjacent to small bowel loops and minimal hazy infiltration of fat planes surrounding the descending colon. No pneumatosis or free intraperitoneal air. Findings may reflect changes of enterocolitis of indeterminate etiology whether ischemic, inflammatory or infectious. No pneumatosis or free intraperitoneal air. 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 Dissection extends into iliac arteries. Inferior mesenteric artery may fill in a retrograde fashion. Appearance unchanged from recent CT angiogram. Scattered normal size lymph nodes. Reproductive: No worrisome abnormality. Foley catheter with decompressed urinary bladder. Other: No bowel containing hernia. Musculoskeletal: No acute osseous abnormality. IMPRESSION: Fluid and gas-filled prominent size small bowel without worrisome point of transition. Fluid-filled featureless colon. Minimal amount of fluid adjacent to small bowel loops and minimal hazy infiltration of fat planes surrounding the descending colon. No pneumatosis or free intraperitoneal air. Findings may reflect changes of enterocolitis of indeterminate etiology whether ischemic, inflammatory or infectious. No pneumatosis or free intraperitoneal air. 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 Infarction majority of the left kidney. Adrenal gland nodularity greater on left stable since recent exams and minimally changed since 2013 as detailed above. Electronically Signed   By: Genia Del M.D.   On: 07/07/2016 14:22   Dg Chest Port 1 View  Result  Date: 07/13/2016 CLINICAL DATA:  Follow-up examination for aortic dissection. EXAM: PORTABLE CHEST 1 VIEW COMPARISON:  Prior radiograph from 07/08/2016. FINDINGS: Enteric tube courses in the the abdomen. Right-sided PICC catheter remains in place with tip overlying the cavoatrial junction. Stable cardiomegaly. Mediastinal silhouette unchanged and within normal limits. Lungs hypoinflated. Persistent patchy and linear left basilar opacity, which may reflect atelectasis or infiltrate. Previously seen left pleural effusion has largely resolved. No new focal airspace opacity. No pulmonary edema. No pneumothorax. No acute osseus abnormality. IMPRESSION: 1. Interval decrease with near resolution of left pleural effusion. Persistent patchy left basilar opacity also improved, and may reflect residual atelectasis and/or infiltrate. 2. No other new cardiopulmonary finding. Electronically Signed   By: Jeannine Boga M.D.   On: 07/13/2016 06:42   Dg Chest Port 1 View  Result Date: 07/08/2016 CLINICAL DATA:  Shortness of Breath EXAM: PORTABLE CHEST 1 VIEW COMPARISON:  July 04, 2016 FINDINGS: Central catheter tip is in the superior vena cava, stable. Nasogastric tube tip and side port are in the stomach. No pneumothorax. There is persistent airspace consolidation in the left lower lobe with small left pleural effusion. Right lung is clear. Heart is upper normal in size with pulmonary vascularity within normal limits. No adenopathy. No evident bone lesions. IMPRESSION: Tube and catheter positions as described without pneumothorax. Persistent left lower lobe consolidation with small left pleural effusion. No new parenchymal lung opacity. Stable cardiac silhouette. Electronically Signed   By: Lowella Grip III M.D.   On: 07/08/2016 08:00   Dg Chest Port 1 View  Result Date: 07/04/2016 CLINICAL DATA:  Followup shortness of breath.  Aortic dissection. EXAM: PORTABLE CHEST 1 VIEW COMPARISON:  07/01/2016 FINDINGS:  Nasogastric tube enters the stomach. Right arm PICC tip is in the SVC above right atrium. Less pulmonary edema. Persistent left effusion. Persistent volume loss in both lower lobes left worse than right. Chronic prominence of the aortic shadow consistent with the history of dissection. IMPRESSION: Less pulmonary edema. Persistent left effusion. Persistent atelectasis in both lower lobes left more than  right. Persistent prominent aortic/mediastinal shadow. Electronically Signed   By: Nelson Chimes M.D.   On: 07/04/2016 08:11   Dg Chest Port 1 View  Result Date: 07/01/2016 CLINICAL DATA:  Shortness of breath common pneumonia, thoracic aortic dissection EXAM: PORTABLE CHEST 1 VIEW COMPARISON:  Portable chest x-ray of Jun 30, 2016 FINDINGS: The lungs are adequately inflated. The pulmonary interstitial markings are less prominent. The retrocardiac region remains dense and there is remains partial obscuration of the left hemidiaphragm. The heart is top-normal in size. The mediastinum is normal in width. The esophagogastric tube tip projects below the inferior margin of the image. The right PICC line tip projects over the midportion of the SVC. IMPRESSION: Slight interval improvement in the appearance of the pulmonary interstitium suggests decreasing interstitial edema. There is persistent left lower lobe atelectasis or pneumonia. Electronically Signed   By: David  Martinique M.D.   On: 07/01/2016 07:35   Dg Chest Port 1 View  Result Date: 06/30/2016 CLINICAL DATA:  Breath, pneumonia, aortic aneurysm, GERD, hypertension, diabetes mellitus EXAM: PORTABLE CHEST 1 VIEW COMPARISON:  Portable exam 0633 hours compared to 06/29/2016 FINDINGS: Nasogastric tube coiled in proximal stomach. RIGHT arm PICC line tip projects over SVC above cavoatrial junction. Upper normal heart size. Mediastinal contours normal. Mild pulmonary vascular congestion. Persistent diffuse BILATERAL infiltrates. Atelectasis versus consolidation LEFT lower  lobe. No pneumothorax. IMPRESSION: Persistent mild diffuse interstitial infiltrates with atelectasis versus consolidation in LEFT lower lobe. Electronically Signed   By: Lavonia Dana M.D.   On: 06/30/2016 06:58   Dg Chest Port 1 View  Result Date: 06/29/2016 CLINICAL DATA:  Shortness of Breath EXAM: PORTABLE CHEST 1 VIEW COMPARISON:  06/28/2016 FINDINGS: Nasogastric catheter is noted coiled within the stomach. Right-sided PICC line is seen in the distal superior vena cava. Cardiac shadow is mildly enlarged but stable. Bilateral vascular congestion and edema is seen stable from prior exam. Some atelectasis remains in the left retrocardiac region. IMPRESSION: No significant interval change from the prior exam. Electronically Signed   By: Inez Catalina M.D.   On: 06/29/2016 07:36   Dg Chest Port 1 View  Result Date: 06/28/2016 CLINICAL DATA:  Aortic dissection. EXAM: PORTABLE CHEST 1 VIEW COMPARISON:  06/27/2016. FINDINGS: The heart is enlarged. There is moderate vascular congestion. Prominence of the aortic contour reflecting the type B dissection. LEFT lower lobe atelectasis with effusion. IMPRESSION: Cardiomegaly. Aortic dissection. Vascular congestion. LEFT lower lobe atelectasis with effusion. Electronically Signed   By: Staci Righter M.D.   On: 06/28/2016 07:13   Dg Chest Port 1 View  Result Date: 06/27/2016 CLINICAL DATA:  ARDS. EXAM: PORTABLE CHEST 1 VIEW COMPARISON:  Jun 26, 2016 FINDINGS: The right PICC line is stable. The NG tube terminates below today's study. Diffuse bilateral primarily interstitial opacities are mildly more homogeneous in less patchy in appearance. More focal opacity in the left base is stable. No other changes. IMPRESSION: 1. Diffuse bilateral primarily interstitial opacities suggesting edema remain. More focal opacity in the left base is stable to mildly improved. Electronically Signed   By: Dorise Bullion III M.D   On: 06/27/2016 07:21   Dg Chest Port 1 View  Result  Date: 06/26/2016 CLINICAL DATA:  Shortness of breath. EXAM: PORTABLE CHEST 1 VIEW COMPARISON:  Radiograph of Jun 25, 2016. FINDINGS: Stable cardiomegaly. Increased bilateral diffuse interstitial and basilar opacities are noted concerning for worsening edema. No pneumothorax is noted. Mild left pleural effusion is noted with associated atelectasis or infiltrate. Right-sided PICC line is  unchanged in position. Nasogastric tube is unchanged in position. Bony thorax is unremarkable. IMPRESSION: Increased bilateral diffuse interstitial densities consistent with edema. Mild left pleural effusion is noted with associated atelectasis or infiltrate. Electronically Signed   By: Marijo Conception, M.D.   On: 06/26/2016 07:40   Dg Chest Port 1 View  Result Date: 06/25/2016 CLINICAL DATA:  Shortness of Breath EXAM: PORTABLE CHEST 1 VIEW COMPARISON:  06/24/2016 FINDINGS: Cardiac shadow is prominent but stable. Right-sided PICC line and nasogastric catheter are again seen and stable. Bibasilar atelectatic changes are noted worse on the left than the right and increased from the prior exam. Small left pleural effusion is noted as well. Mild vascular congestion is seen. IMPRESSION: Increasing bibasilar infiltrates left greater than right. Mild vascular congestion is noted. The Electronically Signed   By: Inez Catalina M.D.   On: 06/25/2016 07:54   Dg Chest Port 1 View  Result Date: 06/24/2016 CLINICAL DATA:  Shortness of Breath EXAM: PORTABLE CHEST 1 VIEW COMPARISON:  06/23/2016 FINDINGS: Right-sided PICC line is again noted at the cavoatrial junction. Nasogastric catheter is noted in satisfactory position through the overall inspiratory effort is decreased from the prior exam. Mild left basilar atelectasis remains. No other focal abnormality is noted. IMPRESSION: Stable left basilar atelectasis. Electronically Signed   By: Inez Catalina M.D.   On: 06/24/2016 08:07   Dg Chest Port 1 View  Result Date: 06/23/2016 CLINICAL  DATA:  61 year old female with shortness of breath and abdominal pain. 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 Being treated conservatively at this time, including with bowel rest. EXAM: PORTABLE CHEST 1 VIEW COMPARISON:  Chest radiographs 06/22/2016 and earlier. CTA 06/20/2016 FINDINGS: Portable AP semi upright view at 0754 hours. Stable enteric tube which courses to the abdomen. Stable right PICC line. Mildly larger lung volumes and decreased streaky bibasilar opacity. Mild residual. No pneumothorax or pulmonary edema. No pleural effusion or consolidation. Stable cardiac size and mediastinal contours. IMPRESSION: 1.  Stable lines and tubes. 2. Mildly improved lung volumes and regressed bibasilar opacity favored to be atelectasis. Electronically Signed   By: Genevie Ann M.D.   On: 06/23/2016 08:19   Dg Chest Port 1 View  Result Date: 06/22/2016 CLINICAL DATA:  61 year old female with history of abdominal aortic aneurysm. Shortness of breath and vomiting. EXAM: PORTABLE CHEST 1 VIEW COMPARISON:  Chest x-ray 06/21/2016. FINDINGS: There is a right upper extremity PICC with tip terminating in the superior cavoatrial junction. A nasogastric tube is seen extending into the stomach, however, the tip of the nasogastric tube extends below the lower margin of the image. Lung volumes are low. Linear bibasilar opacities favored to reflect areas of subsegmental atelectasis, however, underlying airspace consolidation from infection or aspiration is not excluded. No pleural effusions. No evidence of pulmonary edema. Heart size is normal. Upper mediastinal contours are within normal limits. IMPRESSION: 1. Support apparatus, as above. 2. Low lung volumes with bibasilar areas of atelectasis and/or airspace consolidation. Electronically Signed   By: Vinnie Langton M.D.   On: 06/22/2016 07:30   Dg Chest Port 1  View  Result Date: 06/21/2016 CLINICAL DATA:  Aortic dissection EXAM: PORTABLE CHEST 1 VIEW COMPARISON:  06/20/2016 FINDINGS: Bibasilar atelectasis. Heart is normal size. No effusions or acute bony abnormality. IMPRESSION: Bibasilar atelectasis. Electronically Signed   By: Rolm Baptise M.D.   On: 06/21/2016 07:18   Dg Chest Port 1  View  Result Date: 06/20/2016 CLINICAL DATA:  Hypoxia, unresponsive EXAM: PORTABLE CHEST 1 VIEW COMPARISON:  CTA chest dated 06/20/2016 FINDINGS: Lungs are essentially clear. No focal consolidation. No pleural effusion or pneumothorax. The heart is top-normal in size. IMPRESSION: No evidence of acute cardiopulmonary disease. Electronically Signed   By: Julian Hy M.D.   On: 06/20/2016 17:01   Dg Chest Port 1 View  Result Date: 06/20/2016 CLINICAL DATA:  Sudden onset sharp substernal chest pain that radiates to the back. Nausea, dizziness and shortness of breath. EXAM: PORTABLE CHEST 1 VIEW COMPARISON:  None. FINDINGS: Trachea is midline. Heart size is accentuated by AP supine technique. Probable mild scarring in the right middle lobe and lingula. Lungs are otherwise clear. No pleural fluid. IMPRESSION: No acute findings. Electronically Signed   By: Lorin Picket M.D.   On: 06/20/2016 09:56   Dg Abd Portable 1v  Result Date: 07/17/2016 CLINICAL DATA:  Follow-up ileus EXAM: PORTABLE ABDOMEN - 1 VIEW COMPARISON:  07/16/2016 FINDINGS: Scattered large and small bowel gas is noted. Small bowel dilatation is again identified and stable. No free air is seen. No abnormal mass is noted. Changes of prior cholecystectomy are seen. No bony abnormality is noted. IMPRESSION: Stable distension of the small bowel. Continued follow-up is recommended. Electronically Signed   By: Inez Catalina M.D.   On: 07/17/2016 07:52   Dg Abd Portable 1v  Result Date: 07/16/2016 CLINICAL DATA:  Abdominal pain and distention. EXAM: PORTABLE ABDOMEN - 1 VIEW COMPARISON:  Single-view of the abdomen  07/13/2016 and 07/11/2016. FINDINGS: NG tube is no longer visualized. There is unchanged gaseous distention of small and large bowel. IMPRESSION: NG tube is no longer visualized. No change in gas distention of bowel suggestive of ileus. Electronically Signed   By: Inge Rise M.D.   On: 07/16/2016 07:53   Dg Abd Portable 1v  Result Date: 07/13/2016 CLINICAL DATA:  Initial evaluation for aortic dissection, abdominal distension. EXAM: PORTABLE ABDOMEN - 1 VIEW COMPARISON:  Prior radiograph from 07/11/2016. FINDINGS: Enteric to is scored within the stomach. Cholecystectomy clips noted. There are persistent scattered gas-filled loops of bowel within the abdomen. Overall volume gas is slightly decreased from prior, although the bowel gas pattern has not significantly changed. IMPRESSION: 1. Persistent gas-filled loops of small and large bowel within the abdomen, suggesting small and large bowel ileus. Overall, degree of gaseous distention is slightly improved from previous. 2. NG tube coiled within the stomach. Electronically Signed   By: Jeannine Boga M.D.   On: 07/13/2016 06:39   Dg Abd Portable 1v  Result Date: 07/11/2016 CLINICAL DATA:  61 year old female with type B aortic dissection, left renal infarcts, and bowel ileus. EXAM: PORTABLE ABDOMEN - 1 VIEW COMPARISON:  07/10/2016, CT Abdomen and Pelvis 07/07/2016, and earlier FINDINGS: KUB view of the abdomen at 0528 hours. NG tube looped in the stomach. Stable cholecystectomy clips. Continued gas-filled prominent large and small bowel loops throughout the abdomen. Mildly increased volume of bowel gas but otherwise the bowel gas pattern has not changed since 07/07/2016 where air-fluid levels were demonstrated by CT in both large and small bowel. IMPRESSION: 1. Stable bowel gas pattern since 07/07/2016 suggesting small and large bowel ileus. 2. Stable NG tube in the stomach. Electronically Signed   By: Genevie Ann M.D.   On: 07/11/2016 07:29   Dg Abd  Portable 1v  Result Date: 07/10/2016 CLINICAL DATA:  Ileus.  Aortic dissection. EXAM: PORTABLE ABDOMEN - 1 VIEW COMPARISON:  CT scan  July 07, 2016.  KUB July 08, 2016 FINDINGS: Persistent small bowel dilatation measuring up to 4.5 cm, mildly more prominent in the interval. The transverse colon is also air-filled but nondilated. The descending colon is relatively decompressed. An NG tube terminates in the stomach. No free air, portal venous gas, or pneumatosis on supine imaging. IMPRESSION: 1. The dilated small bowel loops are mildly more prominent in the interval. Electronically Signed   By: Dorise Bullion III M.D   On: 07/10/2016 07:28   Dg Abd Portable 1v  Result Date: 07/08/2016 CLINICAL DATA:  Abdominal distention EXAM: PORTABLE ABDOMEN - 1 VIEW COMPARISON:  July 07, 2016 FINDINGS: There may be slightly less bowel dilatation compared to 1 day prior. No air-fluid levels evident. No free air. Nasogastric tube tip and side port remain in stomach. IMPRESSION: Probable ileus with marginally less bowel dilatation compared to 1 day prior. No free air. Nasogastric tube tip and side port in stomach. Electronically Signed   By: Lowella Grip III M.D.   On: 07/08/2016 08:00   Dg Abd Portable 1v  Result Date: 07/07/2016 CLINICAL DATA:  Feeding tube placement. EXAM: PORTABLE ABDOMEN - 1 VIEW COMPARISON:  07/06/2016 FINDINGS: Interval placement of enteric tube which enters the stomach where it is coiled once and has tip and side-port over the gastric fundus just below the left hemidiaphragm. Air-filled loops of large and small bowel with dilated small bowel loop in the left abdomen measuring 5.7 cm in diameter as findings are not significantly changed. Remainder of the exam is unchanged. IMPRESSION: Persistent air-filled dilated small bowel with air throughout the colon. Nasogastric tube coiled once within the stomach with tip and side-port over the gastric fundus just below the left hemidiaphragm. Electronically  Signed   By: Marin Olp M.D.   On: 07/07/2016 19:16   Dg Abd Portable 1v  Result Date: 07/06/2016 CLINICAL DATA:  Multiple episodes of vomiting, abdominal pain and distention. EXAM: PORTABLE ABDOMEN - 1 VIEW COMPARISON:  07/04/2016 FINDINGS: There is persistent dilation and wall edema of small bowel loops, with featureless appearance of some of the visualized small bowel loops. No radiographic evidence of organomegaly or free gas on this limited supine radiograph. IMPRESSION: Persistent dilation/wall edema and featureless appearance of small bowel loops in a nonspecific pattern. These findings may be seen with ileus, incomplete small bowel obstruction or enteritis. Ischemic enteritis continues to be of consideration. Electronically Signed   By: Fidela Salisbury M.D.   On: 07/06/2016 16:14   Dg Abd Portable 1v  Result Date: 07/04/2016 CLINICAL DATA:  Aortic dissection. EXAM: PORTABLE ABDOMEN - 1 VIEW COMPARISON:  07/02/2016 FINDINGS: Nasogastric tube tip in the region of the antrum/pylorus. Persistent group of dilated small bowel loops in the left mid abdomen with possible bowel edema. Possibility of bowel ischemia certainly does exist. Similar appearance to the study of 07/02/2016. IMPRESSION: Nasogastric tube in place. Otherwise similar appearance with abnormal small bowel pattern in the left central abdomen which could go along with partial small bowel obstruction or a focal enteritis including ischemic. Electronically Signed   By: Nelson Chimes M.D.   On: 07/04/2016 08:13   Dg Abd Portable 1v  Result Date: 07/02/2016 CLINICAL DATA:  Nasogastric tube removed.  Acute abdominal pain. EXAM: PORTABLE ABDOMEN - 1 VIEW COMPARISON:  06/27/2016 FINDINGS: Nasogastric tube is been removed. There is a group of dilated fluid and air-filled loops of small intestine in the left abdomen with apparent wall thickening. This could be due to partial  obstruction or enteritis, including ischemic bowel insult.  IMPRESSION: Nasogastric tube removed. Abnormal dilated small intestine in the left central abdomen. Differential diagnosis partial small bowel obstruction versus enteritis, including ischemic. Electronically Signed   By: Nelson Chimes M.D.   On: 07/02/2016 07:37   Dg Abd Portable 1v  Result Date: 06/27/2016 CLINICAL DATA:  Ileus. EXAM: PORTABLE ABDOMEN - 1 VIEW COMPARISON:  Jun 26, 2016 FINDINGS: The NG tube terminates within the stomach. The bowel gas pattern is unremarkable on today's study. No other acute abnormalities. IMPRESSION: The NG tube terminates within the stomach. The bowel gas pattern is unremarkable. Electronically Signed   By: Dorise Bullion III M.D   On: 06/27/2016 07:19   Dg Abd Portable 1v  Result Date: 06/26/2016 CLINICAL DATA:  Shortness of breath, abdominal pain EXAM: PORTABLE ABDOMEN - 1 VIEW COMPARISON:  06/25/2016 FINDINGS: NG tube tip is in the distal stomach. Prior cholecystectomy. Nonobstructive bowel gas pattern. No free air organomegaly. IMPRESSION: NG tube tip in the distal stomach.  No acute findings. Electronically Signed   By: Rolm Baptise M.D.   On: 06/26/2016 07:36   Dg Abd Portable 1v  Result Date: 06/25/2016 CLINICAL DATA:  Shortness of breath.  Abdominal pain. EXAM: PORTABLE ABDOMEN - 1 VIEW COMPARISON:  Jun 24, 2016 FINDINGS: The distal tip of the NG tube is near the gastric antrum. Mild opacity in left lung base will be better assessed on today's chest x-ray. No free air or portal venous gas identified although evaluation is limited on supine imaging. No bowel obstruction. IMPRESSION: No interval change or acute abnormality in the abdomen. Electronically Signed   By: Dorise Bullion III M.D   On: 06/25/2016 07:54   Dg Abd Portable 1v  Result Date: 06/24/2016 CLINICAL DATA:  Abdominal distention. EXAM: PORTABLE ABDOMEN - 1 VIEW COMPARISON:  06/23/2016 . FINDINGS: NG tube noted in stable position with tip in the stomach . Surgical clips right upper quadrant.  Several nonspecific loops of air-filled small bowel again noted. Colonic gas pattern is normal. No free air. IMPRESSION: 1.  NG tube in stable position. 2. Several nonspecific loops of air-filled small bowel again noted. No evidence of progressive bowel distention. Colonic gas pattern normal. No free air. Electronically Signed   By: Marcello Moores  Register   On: 06/24/2016 08:06   Dg Abd Portable 1v  Result Date: 06/23/2016 CLINICAL DATA:  61 year old female with shortness of breath and abdominal pain. 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 Being treated conservatively at this time, including with bowel rest. EXAM: PORTABLE ABDOMEN - 1 VIEW COMPARISON:  Abdominal radiographs 06/22/2016. CTA 06/20/2016, and earlier. FINDINGS: Portable AP supine view at 0759 hours. Stable NG tube, side hole to level of the gastric body and tip at the level of the gastric antrum. Stable cholecystectomy clips. Stable bowel gas pattern with several gas containing nondilated small and large bowel loops. No definite pneumoperitoneum on this supine view. Abdominal and pelvic visceral contours appear stable. No acute osseous abnormality identified. IMPRESSION: 1. Stable NG tube position. 2. Stable, nonobstructed bowel-gas pattern. Electronically Signed   By: Genevie Ann M.D.   On: 06/23/2016 08:18   Dg Abd Portable 1v  Result Date: 06/22/2016 CLINICAL DATA:  Abdominal pain for few days with nausea and vomiting. EXAM: PORTABLE ABDOMEN - 1 VIEW COMPARISON:  06/22/2016 abdominal radiograph. FINDINGS: Enteric tube loops in the gastric fundus and terminates in the  body of the stomach, without appreciable kink. Top-normal caliber small bowel loops throughout the abdomen. Minimal colonic stool. No evidence of pneumatosis or pneumoperitoneum. No radiopaque urolithiasis. Cholecystectomy clips are seen in the right upper quadrant of the  abdomen. Patchy opacities at the lung bases. IMPRESSION: 1. Enteric tube loops in the gastric fundus and terminates in the body of the stomach without appreciable kink. 2. Top-normal caliber small bowel loops, unchanged. 3. Patchy bibasilar lung opacities, correlate with chest radiograph. Electronically Signed   By: Ilona Sorrel M.D.   On: 06/22/2016 16:58   Dg Abd Portable 1v  Result Date: 06/22/2016 CLINICAL DATA:  Vomiting.  Shortness of breath.  AAA . EXAM: PORTABLE ABDOMEN - 1 VIEW COMPARISON:  06/21/2016. FINDINGS: Surgical clips right upper quadrant. NG tube noted with tip over the distal stomach. No bowel distention. No acute bony abnormality identified. Mild basilar atelectasis. IMPRESSION: NG tube noted with its tip over the distal stomach. No bowel distention or acute intra-abdominal abnormality identified. Electronically Signed   By: Marcello Moores  Register   On: 06/22/2016 07:30   Dg Abd Portable 1v  Result Date: 06/21/2016 CLINICAL DATA:  Feeding tube placement EXAM: PORTABLE ABDOMEN - 1 VIEW COMPARISON:  06/21/2016 FINDINGS: NG tube coils in the fundus of the stomach with the tip in the distal stomach. Decompression of the stomach. IMPRESSION: NG tube tip in the distal stomach. Electronically Signed   By: Rolm Baptise M.D.   On: 06/21/2016 15:46   Dg Abd Portable 1v  Result Date: 06/21/2016 CLINICAL DATA:  Ileus  Vomiting that started this AM per patient EXAM: PORTABLE ABDOMEN - 1 VIEW COMPARISON:  06/20/2016 FINDINGS: Gaseous distention of the stomach. Paucity of small bowel and colonic gas. Cholecystectomy clips. Linear scarring/ atelectasis in the lung bases. IMPRESSION: 1. Gaseous distention of the stomach. Electronically Signed   By: Lucrezia Europe M.D.   On: 06/21/2016 11:54   Dg Abd Portable 1v  Result Date: 06/20/2016 CLINICAL DATA:  Aortic dissection EXAM: PORTABLE ABDOMEN - 1 VIEW COMPARISON:  CT abdomen/ pelvis dated 06/20/2016 FINDINGS: Nonobstructive bowel gas pattern. Visualized  osseous structures are within normal limits. IMPRESSION: Unremarkable abdominal radiograph. Electronically Signed   By: Julian Hy M.D.   On: 06/20/2016 17:02   Ct Angio Chest/abd/pel For Dissection W And/or W/wo  Result Date: 06/27/2016 CLINICAL DATA:  61 year old female with a history of type B dissection EXAM: CT ANGIOGRAPHY CHEST, ABDOMEN AND PELVIS TECHNIQUE: Multidetector CT imaging through the chest, abdomen and pelvis was performed using the standard protocol during bolus administration of intravenous contrast. Multiplanar reconstructed images and MIPs were obtained and reviewed to evaluate the vascular anatomy. CONTRAST:  100 cc Isovue 370 COMPARISON:  CT 06/20/2016 FINDINGS: CTA CHEST FINDINGS Cardiovascular: Heart: Heart size unchanged. No pericardial fluid/ thickening. No significant coronary calcifications. Aorta: Re- demonstration of type B dissection with the entry tear appearing to originate just beyond the origin of the left subclavian artery. Branch vessels remain patent without extension of the dissection flap into the branch vessels. Caliber and contour of the ascending aorta unremarkable without evidence of retrograde extension. Greatest diameter of the ascending aorta 2.9 cm. Inflammatory changes surrounding the distal aortic arch in the proximal descending aorta. Greatest diameter of the distal aortic arch measures approximately 3.4 cm. Greatest diameter on the comparison CT approximately 3.0 cm. No aneurysm of the descending thoracic aorta with the greatest diameter of the false lumen measuring 2.7 cm. True lumen is compressed. There is a fenestration in the distal  true lumen at the level of the diaphragm just above the aortic hiatus. Pulmonary arteries: No lobar, segmental, or proximal subsegmental filling defects. Mediastinum/Nodes: No mediastinal hemorrhage. Mediastinal lymph nodes are present. Gastric tube within the esophagus. Lungs/Pleura: Ground-glass opacities developing  through the the bilateral lungs. Developing interlobular septal thickening. Atelectasis of the medial segment left lower lobe. Small low-density left pleural effusion trace right-sided pleural effusion and associated atelectasis. No pneumothorax. Right upper extremity PICC appears to terminate superior vena cava. Review of the MIP images confirms the above findings. CTA ABDOMEN AND PELVIS FINDINGS VASCULAR Aorta: Re- demonstration of dissection flap of the abdominal aorta. No aneurysm.  No periaortic fluid of the abdomen. The true lumen is compressed throughout the abdomen. True lumen contributes to celiac artery origin, superior mesenteric artery origin, and also continues across the origin of the inferior mesenteric artery. The false lumen contributes to perfusion of the right renal artery. The lateral margin of the dissection flap involves the origin of 2 left renal arteries both superior and inferior. True lumen is decompressed in the inferior aorta extending into the bilateral iliac arteries. The bilateral common iliac arteries appear perfused from the false lumen bilaterally. False lumen extends in the bilateral external iliac artery. Likely re- entry tear of distal external iliac arteries bilaterally, on the right image 173, on the left image 171. Dissection flap extends into the bilateral hypogastric arteries which are partially patent with decreased flow. Celiac: Origin of the celiac artery originates from the true lumen which is decompressed. Celiac artery remains patent at this time including the branch vessels. Typical branch pattern of splenic artery, left gastric artery, common hepatic artery. SMA: Superior mesenteric artery origin originates from the true lumen which is compressed. SMA remains patent at this time. Renals: Right renal artery originates from the false lumen. Right renal artery is perfusing at this time with uniform perfusion of the right kidney. There are 2 left renal arteries, superior  and inferior. Both arteries originate near the margin of the dissection flap. The superior left renal artery is partially filling, at the in flexion point of the dissection flap, perfusing superior kidney. The inferior left renal artery appears thrombosed, new from the comparison with enlarging left renal infarction, predominantly of the lower pole. IMA: Origin of the inferior mesenteric artery is thrombosed given the collapsed true lumen at this level. There is re- constitution of the distal inferior mesenteric artery via collateral flow. Right lower extremity: False lumen perfuses the right common iliac artery with collapse of the true lumen. Dissection flap extends into the hypogastric artery, with the distal pelvic branch is partially opacifying. External iliac artery is perfusing from the false lumen, with unremarkable appearance of the distal external iliac artery, common femoral artery, profunda femoris, SFA. There is the appearance of a re- entry tear in the mid right external iliac artery. Left lower extremity: False lumen perfuses the left common iliac artery with collapse of the true lumen. Dissection extends into the hypogastric artery which is partially perfused with opacification of pelvic vessels. Distal external iliac artery an the common femoral artery unremarkable, with patent proximal femoral vessels. There is the appearance of a re- entry tear in the mid left external iliac artery. Veins: Unremarkable appearance of the venous system. Review of the MIP images confirms the above findings. NON-VASCULAR Hepatobiliary: Unremarkable appearance of the liver. Cholecystectomy Pancreas: Unremarkable appearance of the pancreas. No pericholecystic fluid or inflammatory changes. Unremarkable ductal system. Spleen: Unremarkable. Adrenals/Urinary Tract: Right adrenal gland unremarkable.  Nodule of the left adrenal gland which measures 2.7 cm. Right: Right-sided kidney perfuses uniformly with no hydronephrosis.  Left: Progressive left renal infarct with small portion of the superior kidney perfusing. The remainder of the left kidney is hypoperfused, progressed from the comparison. No hydronephrosis. Urinary catheter within the urinary bladder. Stomach/Bowel: Unremarkable appearance of stomach with gastric tube in place. Small bowel borderline dilated and fluid-filled. No transition point. The wall of the small bowel relatively uniformly thin and enhancing, although the study is timed for the arterial phase. No focal wall thickening. No abnormally distended colon. No transition point. Fluid filled colon. No focal wall thickening or pericolonic inflammatory changes. Lymphatic: Multiple lymph nodes in the para-aortic nodal station, none of which are enlarged. Mesenteric: No free fluid or air. No adenopathy. Reproductive: Hysterectomy Other: No hernia. Musculoskeletal: No displaced fracture. Degenerative changes of the spine. 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 Only 1 possible fenestration is identified, in the distal thoracic aorta above the aortic hiatus. There is enlarging diameter of the distal aortic arch with associated inflammatory changes, measuring approximately 3.5 on today's study compared to approximately 3.1 cm previously. The appearance is concerning for progression/expansion of intramural hematoma. Progressing left renal infarction, with near complete thrombosis of superior and inferior renal arteries, both of which originate at the margin of the dissection flap. Bilateral common iliac arteries appear to be perfused from the false lumen, with complete collapse of true lumen proximally. There does appear to be re- entry to the true lumen in the mid external iliac artery bilaterally, as there is unremarkable appearance of the proximal femoral vasculature including bilateral common iliac arteries. The 3 mesenteric vessels  originate from the small true lumen, which is progressively compressed. Celiac artery and superior mesenteric artery remain patent, with occlusion at the origin of the inferior mesenteric artery. Three vessel arch, with all 3 branches remain patent. No evidence of extension of the dissection flap into the branch vessels. These preliminary results were discussed by telephone at the time of interpretation on 06/27/2016 at 12:41 pm with Dr. Curt Jews. Borderline dilated small bowel without transition point or obstruction. Findings may represent ileus and/or nonspecific enteritis. No focal wall thickening to suggest Darlinda Bellows ischemia, although the timing of this CTA is specific for arterial evaluation and not the bowel tissues. If ongoing concern for bowel ischemia, would repeat abd/pelvis contrast enhanced-CT portal venous phase. Similar appearance of mixed geographic ground-glass opacities of the bilateral lungs with mild interlobular septal thickening. Again, differential diagnosis includes pulmonary edema, ARDS, atypical infection. Signed, Dulcy Fanny. Earleen Newport, DO [facility] Electronically Signed   By: Corrie Mckusick D.O.   On: 06/27/2016 13:18   Ct Angio Chest/abd/pel For Dissection W And/or W/wo  Result Date: 06/20/2016 CLINICAL DATA:  Substernal chest pain radiating to back EXAM: CT ANGIOGRAPHY CHEST, ABDOMEN AND PELVIS TECHNIQUE: Multidetector CT imaging through the chest, abdomen and pelvis was performed using the standard protocol during bolus administration of intravenous contrast. Multiplanar reconstructed images and MIPs were obtained and reviewed to evaluate the vascular anatomy. CONTRAST:  100 cc Isovue 370 IV COMPARISON:  CT abdomen and pelvis 10/27/2011 FINDINGS: CTA CHEST FINDINGS Cardiovascular: There is the type B aortic dissection beginning in the distal aortic arch just beyond the origin of the great vessels. The true lumen is compressed by the  false lumen. No aneurysm. No pulmonary embolus. Heart is normal size. Mediastinum/Nodes: No mediastinal,  hilar, or axillary adenopathy. Lungs/Pleura: Atelectasis or scarring in the lingula. Lungs otherwise clear. No effusions. Musculoskeletal: No acute bony abnormality. Review of the MIP images confirms the above findings. CTA ABDOMEN AND PELVIS FINDINGS VASCULAR Aorta: Dissection continues throughout the abdominal aorta with the celiac artery and superior mesenteric artery arising from the true lumen anteriorly. The dissection may extend into the left renal artery where there is only a small amount of blood flow noted in the proximal and mid left renal artery. Right renal artery appears to arise from the false lumen and is patent. The the true lumen appears thrombosed below the origin of the superior mesenteric artery. Inferior mesenteric artery arises from the thrombosed true lumen and appears occluded proximally, reconstitutes several cm from the origin. Celiac: Patent. SMA: Patent. Renals: As above. IMA: As above. Inflow: To the dissection continues into both common iliac arteries with thrombosed true lumens. The dissection appears to terminate at approximately the level of the common iliac bifurcation bilaterally. Veins: Grossly patent and unremarkable. Review of the MIP images confirms the above findings. NON-VASCULAR Hepatobiliary: Mild diffuse fatty infiltration. Prior cholecystectomy. Pancreas: No focal abnormality or ductal dilatation. Spleen: No focal abnormality.  Normal size. Adrenals/Urinary Tract: Nodules in the left adrenal gland are low-density on the precontrast imaging compatible with small adenomas. There are areas of non perfusion noted in the mid and lower poles of the left kidney compatible with infarction, likely related to the involvement of the left renal artery by dissection. Urinary bladder unremarkable. Stomach/Bowel: Stomach, large and small bowel grossly unremarkable. Lymphatic: No  adenopathy. Reproductive: Prior hysterectomy.  No adnexal masses. Other: No free fluid or free air. Musculoskeletal: No acute bony abnormality. Review of the MIP images confirms the above findings. 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 This involves the descending thoracic aorta, abdominal aorta and iliac vessels. The true lumen is compressed by the larger false lumen, and is thrombosed below the SMA/renal artery origins. The dissection appears to extend into the left renal artery with partial occlusion and areas of infarct in the mid and lower pole of the left kidney. Fatty infiltration of the liver. Critical Value/emergent results were called by telephone at the time of interpretation on 06/20/2016 at 10:13 am to Dr. Fredia Sorrow , who verbally acknowledged these results. Electronically Signed   By: Rolm Baptise M.D.   On: 06/20/2016 10:15   Anti-infectives: Anti-infectives    Start     Dose/Rate Route Frequency Ordered Stop   07/18/16 0000  levofloxacin (LEVAQUIN) IVPB 500 mg  Status:  Discontinued     500 mg 100 mL/hr over 60 Minutes Intravenous Every 24 hours 07/17/16 0903 07/17/16 0938   07/17/16 1100  vancomycin (VANCOCIN) 1,500 mg in sodium chloride 0.9 % 500 mL IVPB     1,500 mg 250 mL/hr over 120 Minutes Intravenous Every 24 hours 07/17/16 0946 07/24/16 1059   07/17/16 1000  levofloxacin (LEVAQUIN) tablet 500 mg  Status:  Discontinued     500 mg Oral Daily 07/17/16 0851 07/17/16 0903   07/15/16 0900  ciprofloxacin (CIPRO) tablet 500 mg  Status:  Discontinued     500 mg Oral 2 times daily 07/15/16 0828 07/17/16 0851   07/14/16 1115  levofloxacin (LEVAQUIN) tablet 500 mg  Status:  Discontinued     500 mg Oral Daily 07/14/16 1107 07/14/16 1214   06/26/16 0830  meropenem (MERREM) 1 g in sodium chloride 0.9 % 100  mL IVPB  Status:  Discontinued     1 g 200 mL/hr over 30 Minutes Intravenous Every 8 hours 06/26/16 0810  07/06/16 0804   06/22/16 1800  clindamycin (CLEOCIN) IVPB 600 mg  Status:  Discontinued     600 mg 100 mL/hr over 30 Minutes Intravenous Every 8 hours 06/22/16 1723 06/26/16 0810   06/22/16 1000  azithromycin (ZITHROMAX) 250 mg in dextrose 5 % 125 mL IVPB  Status:  Discontinued     250 mg 125 mL/hr over 60 Minutes Intravenous Every 24 hours 06/22/16 0826 06/27/16 1019   06/22/16 0900  levofloxacin (LEVAQUIN) IVPB 500 mg  Status:  Discontinued     500 mg 100 mL/hr over 60 Minutes Intravenous Every 24 hours 06/22/16 0759 06/22/16 0824      Assessment/Plan: s/p * No surgery found * Awaiting CT scan ordered yesterday. No clinical change. H&H continues to trend downward. No overt bleeding.   LOS: 28 days   EarlySherren Mocha 07/18/2016, 8:26 AM

## 2016-07-18 NOTE — Plan of Care (Signed)
Problem: Activity: Goal: Risk for activity intolerance will decrease Outcome: Progressing Pt ambulating 2-3 times daily, varying in distance durability.  Problem: Bowel/Gastric: Goal: Will not experience complications related to bowel motility Outcome: Not Progressing Pt developed s/p colonoscopy requiring placement of NGT. NGT D/C'd 07-17-16 after return of faint bowel sounds and liquid BMs with sl. C/o nausea without vomiting. Will continue to assess.

## 2016-07-18 NOTE — Progress Notes (Signed)
PHARMACY - ADULT TOTAL PARENTERAL NUTRITION CONSULT NOTE   Pharmacy Consult:  TPN Indication:  Prolonged NPO status/high NG output   Patient Measurements: Height: 5\' 3"  (160 cm) Weight: 183 lb 13.8 oz (83.4 kg) IBW/kg (Calculated) : 52.4 TPN AdjBW (KG): 59.4 Body mass index is 32.57 kg/m. 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 Patient had a significant history of N/V for 2-4 weeks PTA but weight appears to have been maintained. Patient has had several NG tube replacement due to nausea and vomiting.  Pharmacy consulted to manage TPN since 06/23/16.     GI: ileus and mesenteric ischemia. NGT removed 6/11.  PPI IV, frequent Zofran usage Endo: DM on glipizide/metformin PTA - CBGs controlled Insulin requirements in the past 24 hours: 7 units SSI, Lantus 35 units BID + 45 units in TPN Lytes: low Na/CO2, others WNL Renal: SCr down 1.15, BUN 26 - NS at 42ml/hr, net +11.8L since admit Pulm: stable on RA - Brovana, Pulmicort, PRN albuterol Cards: Stanford type B aortic dissection - BP soft-normal, tachy - Lopressor, clonidine 0.3 mg patch, PRN labetalol, off IV Lasix, albumin Hepatobil: ALT/ALT down 74/140, tbili down 1.4, TG WNL - TPN is balanced. Neuro: Celexa, Klonopin, PRN Fentanyl/Dialudid/Xanax - weaning narcotics ID: [id number] Cipro >> Vanc D#2/7 for E.faecalis UTI.  S/p Merrem (5/25-6/4) for respiratory failure from PNA. C.diff negative.  Tmax 101.2, WBC WNL.  6/14 BCx NGTD Best Practices: Lovenox 30mg , CHG, MC TPN Access: PICC placed 06/21/16 TPN start date: 06/23/16  Nutritional Goals (per RD recommendation on 5/25): 2000-2200 kCal and 110-120 gm protein  Current Nutrition:   TPN Boost BID (received none yesterday) - each provides 9g protein + 250kcal   Plan:  - Continue cyclic Clinimix E 2/01, infuse 1990 mls over 20 hrs:  50 ml/hr x 1 hr, then 105 ml/hr x 18 hrs, then 50 ml/hr x 1 hr.  Challenging  with current insulin requirement. - Continue 20% ILE at 20 ml/hr x 12 hrs - TPN provides 1893 kCal and 100gm of protein per day, meeting > 90% of total needs  - Daily multivitamin in TPN - Trace elements in TPN every other day d/t shortage, next 6/17 - Continue TCTS SSI Q4H + 45 units regular insulin in TPN + Lantus 35 units BID.  Watch CBGs closely with cyclic TPN. - F/U tolerance to reduce TPN cycle further - F/U AM labs, ?start bicarb   Haley Graham, PharmD, BCPS Pager:  [phone] 07/18/2016, 8:15 AM

## 2016-07-18 NOTE — Progress Notes (Signed)
Pt requested to return to bed. Noted to be tachypneic and with increased WOB. Pt reports her breathing "is just slow". Denies SOB. Encouraged pt to sit up in chair, she refused and returned to bed. Educated on need to use IS, pt reported understanding, and promptly fell asleep. Husband at bedside. Sats remain in mid-90s on RA.

## 2016-07-18 NOTE — Progress Notes (Signed)
PT CBG 65. OJ given and subsequent check 71. Pt asymptomatic. Additional OJ given.

## 2016-07-18 NOTE — Progress Notes (Signed)
Paged Dr Rosendo Gros and notified Dr [name] that CTs were complete. Pt reporting discomfort in chest, states this is not new and it "comes and goes". Denies this is a change since admission and reports it "feels like a panic attack". PRN Vistaril ordered from pharmacy and will be given.

## 2016-07-18 NOTE — Progress Notes (Signed)
Subjective/Chief Complaint: Pt with NAE CTs pending + non bloody BM   Objective: Vital signs in last 24 hours: Temp:  [98 F (36.7 C)-101.2 F (38.4 C)] 98 F (36.7 C) (06/16 0800) Pulse Rate:  [98-112] 105 (06/16 0800) Resp:  [16-29] 26 (06/16 0800) BP: (99-135)/(45-63) 132/63 (06/16 0800) SpO2:  [92 %-96 %] 95 % (06/16 0826) Weight:  [83.4 kg (183 lb 13.8 oz)] 83.4 kg (183 lb 13.8 oz) (06/16 0613) Last BM Date: 07/18/16  Intake/Output from previous day: 06/15 0701 - 06/16 0700 In: 3240.7 [P.O.:390; I.V.:2650.7; IV Piggyback:200] Out: -  Intake/Output this shift: Total I/O In: 115 [I.V.:115] Out: -   General appearance: alert and cooperative GI: soft, dist, min ttp, no rebound/guarding  Lab Results:   Recent Labs  07/17/16 0458 07/18/16 0337  WBC 9.2 9.5  HGB 8.2* 7.7*  HCT 25.4* 22.8*  PLT 152 148*   BMET  Recent Labs  07/17/16 0630 07/18/16 0337  NA 132* 132*  K 4.2 4.6  CL 109 111  CO2 18* 16*  GLUCOSE 95 151*  BUN 30* 26*  CREATININE 1.21* 1.15*  CALCIUM 8.0* 7.4*   PT/INR No results for input(s): LABPROT, INR in the last 72 hours. ABG No results for input(s): PHART, HCO3 in the last 72 hours.  Invalid input(s): PCO2, PO2  Studies/Results: Dg Abd Portable 1v  Result Date: 07/18/2016 CLINICAL DATA:  Ileus EXAM: PORTABLE ABDOMEN - 1 VIEW COMPARISON:  07/17/2016 FINDINGS: Dilated large and small bowel loops unchanged. Colonic mucosal thickening unchanged suggesting colitis. Surgical clips in the gallbladder fossa IMPRESSION: Adynamic ileus unchanged.  Colonic mucosal edema unchanged. Electronically Signed   By: Franchot Gallo M.D.   On: 07/18/2016 08:40   Dg Abd Portable 1v  Result Date: 07/17/2016 CLINICAL DATA:  Follow-up ileus EXAM: PORTABLE ABDOMEN - 1 VIEW COMPARISON:  07/16/2016 FINDINGS: Scattered large and small bowel gas is noted. Small bowel dilatation is again identified and stable. No free air is seen. No abnormal mass is  noted. Changes of prior cholecystectomy are seen. No bony abnormality is noted. IMPRESSION: Stable distension of the small bowel. Continued follow-up is recommended. Electronically Signed   By: Inez Catalina M.D.   On: 07/17/2016 07:52    Anti-infectives: Anti-infectives    Start     Dose/Rate Route Frequency Ordered Stop   07/18/16 0000  levofloxacin (LEVAQUIN) IVPB 500 mg  Status:  Discontinued     500 mg 100 mL/hr over 60 Minutes Intravenous Every 24 hours 07/17/16 0903 07/17/16 0938   07/17/16 1100  vancomycin (VANCOCIN) 1,500 mg in sodium chloride 0.9 % 500 mL IVPB     1,500 mg 250 mL/hr over 120 Minutes Intravenous Every 24 hours 07/17/16 0946 07/24/16 1059   07/17/16 1000  levofloxacin (LEVAQUIN) tablet 500 mg  Status:  Discontinued     500 mg Oral Daily 07/17/16 0851 07/17/16 0903   07/15/16 0900  ciprofloxacin (CIPRO) tablet 500 mg  Status:  Discontinued     500 mg Oral 2 times daily 07/15/16 0828 07/17/16 0851   07/14/16 1115  levofloxacin (LEVAQUIN) tablet 500 mg  Status:  Discontinued     500 mg Oral Daily 07/14/16 1107 07/14/16 1214   06/26/16 0830  meropenem (MERREM) 1 g in sodium chloride 0.9 % 100 mL IVPB  Status:  Discontinued     1 g 200 mL/hr over 30 Minutes Intravenous Every 8 hours 06/26/16 0810 07/06/16 0804   06/22/16 1800  clindamycin (CLEOCIN) IVPB 600 mg  Status:  Discontinued     600 mg 100 mL/hr over 30 Minutes Intravenous Every 8 hours 06/22/16 1723 06/26/16 0810   06/22/16 1000  azithromycin (ZITHROMAX) 250 mg in dextrose 5 % 125 mL IVPB  Status:  Discontinued     250 mg 125 mL/hr over 60 Minutes Intravenous Every 24 hours 06/22/16 0826 06/27/16 1019   06/22/16 0900  levofloxacin (LEVAQUIN) IVPB 500 mg  Status:  Discontinued     500 mg 100 mL/hr over 60 Minutes Intravenous Every 24 hours 06/22/16 0759 06/22/16 0824      Assessment/Plan: Aortic dissection Intestinal angina  -f/u CTs -con't current care -following    LOS: 28 days    Rosario Jacks.,  [facility] 07/18/2016

## 2016-07-18 NOTE — Progress Notes (Signed)
  Subjective: Denies abdominal pain  Objective: Vital signs in last 24 hours: Temp:  [98.2 F (36.8 C)-101.2 F (38.4 C)] 98.2 F (36.8 C) (06/15 2315) Pulse Rate:  [98-112] 112 (06/16 0600) Cardiac Rhythm: Normal sinus rhythm (06/15 2000) Resp:  [16-29] 25 (06/16 0600) BP: (99-133)/(45-62) 133/58 (06/16 0600) SpO2:  [92 %-96 %] 94 % (06/16 0600) Weight:  [183 lb 13.8 oz (83.4 kg)] 183 lb 13.8 oz (83.4 kg) (06/16 7078)  Hemodynamic parameters for last 24 hours:    Intake/Output from previous day: 06/15 0701 - 06/16 0700 In: 3125.7 [P.O.:390; I.V.:2535.7; IV Piggyback:200] Out: -  Intake/Output this shift: No intake/output data recorded.  General appearance: cooperative and lethargic but easily arousable Neurologic: intact Heart: tachy, regular Lungs: diminished breath sounds bibasilar Abdomen: distended, mildly tender  Lab Results:  Recent Labs  07/17/16 0458 07/18/16 0337  WBC 9.2 9.5  HGB 8.2* 7.7*  HCT 25.4* 22.8*  PLT 152 148*   BMET:  Recent Labs  07/17/16 0630 07/18/16 0337  NA 132* 132*  K 4.2 4.6  CL 109 111  CO2 18* 16*  GLUCOSE 95 151*  BUN 30* 26*  CREATININE 1.21* 1.15*  CALCIUM 8.0* 7.4*    PT/INR: No results for input(s): LABPROT, INR in the last 72 hours. ABG    Component Value Date/Time   PHART 7.430 06/28/2016 1615   HCO3 20.7 06/28/2016 1615   TCO2 22 06/28/2016 1615   ACIDBASEDEF 3.0 (H) 06/28/2016 1615   O2SAT 93.0 06/28/2016 1615   CBG (last 3)   Recent Labs  07/17/16 2032 07/17/16 2312 07/18/16 0348  GLUCAP 128* 127* 151*    Assessment/Plan: S/P  -Aortic dissection- BP well controlled  - Mesenteric ischemia- NG removed yesterday. CT abdomen not done.  Is distended on exam but she tells me it is not worse  Mildly tender to palpation-   Plan per GS  -Nutrition- TNA  Fever to 101.2 last PM, WBC normal, + UTI enterococcus- on vanco   LOS: 28 days    Melrose Nakayama 07/18/2016

## 2016-07-18 NOTE — Progress Notes (Signed)
      [address]       [place] [address]             [phone]      Asleep at present  BP (!) 123/53   Pulse 100   Temp 99.2 F (37.3 C) (Oral)   Resp (!) 24   Ht 5\' 3"  (1.6 m)   Wt 183 lb 13.8 oz (83.4 kg)   SpO2 95%   BMI 32.57 kg/m    Intake/Output Summary (Last 24 hours) at 07/18/16 1642 Last data filed at 07/18/16 1100  Gross per 24 hour  Intake          2613.67 ml  Output                0 ml  Net          2613.67 ml   CT chest and abdomen performed. Has not been officially read yet. The dissection appears unchanged to my review.  [name] [name], MD [facility] [phone]

## 2016-07-19 ENCOUNTER — Inpatient Hospital Stay (HOSPITAL_COMMUNITY): Payer: BLUE CROSS/BLUE SHIELD

## 2016-07-19 LAB — BASIC METABOLIC PANEL
Anion gap: 6 (ref 5–15)
BUN: 24 mg/dL — AB (ref 6–20)
CALCIUM: 8.3 mg/dL — AB (ref 8.9–10.3)
CHLORIDE: 109 mmol/L (ref 101–111)
CO2: 17 mmol/L — AB (ref 22–32)
CREATININE: 1.23 mg/dL — AB (ref 0.44–1.00)
GFR calc non Af Amer: 47 mL/min — ABNORMAL LOW (ref >60)
GFR, EST AFRICAN AMERICAN: 55 mL/min — AB (ref >60)
Glucose, Bld: 212 mg/dL — ABNORMAL HIGH (ref 65–99)
Potassium: 4.8 mmol/L (ref 3.5–5.1)
SODIUM: 132 mmol/L — AB (ref 135–145)

## 2016-07-19 LAB — GLUCOSE, CAPILLARY
Glucose-Capillary: 214 mg/dL — ABNORMAL HIGH (ref 65–99)
Glucose-Capillary: 198 mg/dL — ABNORMAL HIGH (ref 65–99)
Glucose-Capillary: 234 mg/dL — ABNORMAL HIGH (ref 65–99)
Glucose-Capillary: 275 mg/dL — ABNORMAL HIGH (ref 65–99)

## 2016-07-19 LAB — CBC
HCT: 21.1 % — ABNORMAL LOW (ref 36.0–46.0)
Hemoglobin: 6.8 g/dL — CL (ref 12.0–15.0)
MCH: 31.6 pg (ref 26.0–34.0)
MCHC: 32.2 g/dL (ref 30.0–36.0)
MCV: 98.1 fL (ref 78.0–100.0)
Platelets: 147 10*3/uL — ABNORMAL LOW (ref 150–400)
RBC: 2.15 MIL/uL — ABNORMAL LOW (ref 3.87–5.11)
RDW: 15.2 % (ref 11.5–15.5)
WBC: 8.1 10*3/uL (ref 4.0–10.5)

## 2016-07-19 LAB — PROTIME-INR
INR: 1.72
Prothrombin Time: 20.3 seconds — ABNORMAL HIGH (ref 11.4–15.2)

## 2016-07-19 LAB — APTT: aPTT: 35 seconds (ref 24–36)

## 2016-07-19 LAB — PREPARE RBC (CROSSMATCH)

## 2016-07-19 MED ORDER — FAT EMULSION 20 % IV EMUL
240.0000 mL | INTRAVENOUS | Status: AC
  Administered 2016-07-19: 240 mL via INTRAVENOUS
  Filled 2016-07-19: qty 250

## 2016-07-19 MED ORDER — MAGNESIUM SULFATE IN D5W 1-5 GM/100ML-% IV SOLN
1.0000 g | Freq: Once | INTRAVENOUS | Status: AC
  Administered 2016-07-19: 1 g via INTRAVENOUS
  Filled 2016-07-19: qty 100

## 2016-07-19 MED ORDER — TRACE MINERALS CR-CU-MN-SE-ZN 10-1000-500-60 MCG/ML IV SOLN
INTRAVENOUS | Status: AC
  Administered 2016-07-19: 18:00:00 via INTRAVENOUS
  Filled 2016-07-19: qty 1992

## 2016-07-19 MED ORDER — SODIUM CHLORIDE 0.9 % IV SOLN: Freq: Once | INTRAVENOUS | Status: DC

## 2016-07-19 MED ORDER — FUROSEMIDE 10 MG/ML IJ SOLN
40.0000 mg | Freq: Once | INTRAMUSCULAR | Status: AC
  Administered 2016-07-19: 40 mg via INTRAVENOUS
  Filled 2016-07-19: qty 4

## 2016-07-19 MED ORDER — ALTEPLASE 2 MG IJ SOLR
2.0000 mg | Freq: Once | INTRAMUSCULAR | Status: AC
  Administered 2016-07-19: 2 mg

## 2016-07-19 MED ORDER — METHYLPREDNISOLONE SODIUM SUCC 40 MG IJ SOLR
40.0000 mg | Freq: Two times a day (BID) | INTRAMUSCULAR | Status: AC
  Administered 2016-07-19 (×2): 40 mg via INTRAVENOUS
  Filled 2016-07-19 (×2): qty 1

## 2016-07-19 NOTE — Progress Notes (Signed)
[facility] Progress Note    Subjective: Patient looks worse today than the last time I have seen her.  Objective: Vital signs in last 24 hours: Temp:  [98 F (36.7 C)-99.7 F (37.6 C)] 98 F (36.7 C) (06/17 0753) Pulse Rate:  [97-127] 116 (06/17 0753) Resp:  [23-39] 30 (06/17 0753) BP: (106-149)/(42-66) 140/60 (06/17 0753) SpO2:  [91 %-100 %] 96 % (06/17 0753) Last BM Date: 07/18/16  Intake/Output from previous day: 06/16 0701 - 06/17 0700 In: 2302.8 [I.V.:2152.8; IV Piggyback:150] Out: 1050 [Urine:950; Stool:100] Intake/Output this shift: No intake/output data recorded.  General: Breathing heavily.  Lungs: Diminished bilaterally  Abd: Distended, hypoactive bowel sounds.  Mildly tender  Extremities: No changes  Neuro: Intact, but worried about difficulty breathing  Lab Results:  @LABLAST2 (wbc:2,hgb:2,hct:2,plt:2) BMET ) Recent Labs  07/18/16 0337 07/19/16 0420  NA 132* 132*  K 4.6 4.8  CL 111 109  CO2 16* 17*  GLUCOSE 151* 212*  BUN 26* 24*  CREATININE 1.15* 1.23*  CALCIUM 7.4* 8.3*   PT/INR  Recent Labs  07/19/16 0630  LABPROT 20.3*  INR 1.72   ABG No results for input(s): PHART, HCO3 in the last 72 hours.  Invalid input(s): PCO2, PO2  Studies/Results: Ct Chest W Contrast  Result Date: 07/18/2016 CLINICAL DATA:  Pt c/o abdominal pain for several days with nausea ^160mL ISOVUE-300 IOPAMIDOL (ISOVUE-300) INJECTION 61%; Pt c/o abdominal pain for several days Pt c/o abdominal pain for several days with nausea ^154mL ISOVUE-300 IOPAMIDOL (ISOVUE-300) INJECTION 61% EXAM: CT CHEST, ABDOMEN, AND PELVIS WITH CONTRAST TECHNIQUE: Multidetector CT imaging of the chest, abdomen and pelvis was performed following the standard protocol during bolus administration of intravenous contrast. CONTRAST:  194mL ISOVUE-300 IOPAMIDOL (ISOVUE-300) INJECTION 61% COMPARISON:  CT of the chest abdomen and pelvis on 06/27/2016 and CT abdomen and pelvis on 07/07/2016 FINDINGS: CT  CHEST FINDINGS Cardiovascular: Again demonstrated is a type B aortic dissection. Flap appears stable, beginning just distal to the left subclavian artery. 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 Heart size is normal. Mitral calcifications are present. No pericardial effusion. Great vessels are atherosclerotic but not aneurysmal. The dissection flap does not extend into the great vessels. A right central line extends into the superior vena cava. Pulmonary arteries have a normal appearance given the timing of the contrast bolus. Mediastinum/Nodes: The visualized portion of the thyroid gland has a normal appearance. No mediastinal, hilar, or axillary adenopathy. Esophagus is normal in appearance. Lungs/Pleura: Increased left pleural effusion. There is new left lower lobe consolidation associated with air bronchograms. Focal areas of atelectasis or infiltrate are identified within the lingula and right lower lobe. No suspicious pulmonary nodules. Musculoskeletal: No chest wall mass or suspicious bone lesions identified. CT ABDOMEN PELVIS FINDINGS Hepatobiliary: Status post cholecystectomy. Liver is normal in appearance. Pancreas: Unremarkable. No pancreatic ductal dilatation or surrounding inflammatory changes. Spleen: Normal in size without focal abnormality. Adrenals/Urinary Tract: Stable appearance of numerous left adrenal nodules, largest measuring 2.7 cm. The right adrenal gland is normal in appearance. Normal enhancement and excretion from the right kidney. Majority of the left kidney appears infarcted with no excretion in demonstrate on the delayed images. Stomach/Bowel: The stomach has a normal appearance. There is dilatation of small bowel loops common beginning at the proximal jejunum. Loops are fluid-filled without significant wall thickening. There is similar dilatation and fluid-filled appearance of large bowel loops. Findings most consistent with ileus. No transition zone or mass identified.  Vascular/Lymphatic: Type B dissection of the aortic continues into the  iliac vessels. There is occlusion of the true lumen distal to this superior mesenteric artery. The right renal artery perfuses from the false lumen and opacifies with contrast. The dual left renal arteries at their origin near the dissection flap and are less well seen compared the prior study. Superior mesenteric artery and celiac axis are well opacified. Contrast is identified within the inferior mesenteric artery, likely from retrograde filling. Reproductive: Status post hysterectomy.  No adnexal mass. Other: No abdominal wall hernia or abnormality. No abdominopelvic ascites. Musculoskeletal: No acute or significant osseous findings. IMPRESSION: 1. Stable appearance of type B dissection. 2. Aortic aneurysm NOS (ICD10-I71.9). Distal aortic arch is stable at 3.5 cm. Recommend annual imaging followup by CTA or MRA. This recommendation follows 2010 ACCF/AHA/AATS/ACR/ASA/SCA/SCAI/SIR/STS/SVM Guidelines for the Diagnosis and Management of Patients with Thoracic Aortic Disease. Circulation.2010; 121: Q008-Q761 3. Interval development of left pleural effusion and left lower lobe consolidation. 4. Infarcted left kidney. 5. Dilated fluid filled large and small bowel consistent with ileus. No evidence for obstruction or mass. 6. Stable appearance of left adrenal nodules, consistent with benign process. 7. Status post hysterectomy. Electronically Signed   By: Nolon Nations M.D.   On: 07/18/2016 16:55   Ct Abdomen Pelvis W Contrast  Result Date: 07/18/2016 CLINICAL DATA:  Pt c/o abdominal pain for several days with nausea ^180mL ISOVUE-300 IOPAMIDOL (ISOVUE-300) INJECTION 61%; Pt c/o abdominal pain for several days Pt c/o abdominal pain for several days with nausea ^137mL ISOVUE-300 IOPAMIDOL (ISOVUE-300) INJECTION 61% EXAM: CT CHEST, ABDOMEN, AND PELVIS WITH CONTRAST TECHNIQUE: Multidetector CT imaging of the chest, abdomen and pelvis was performed  following the standard protocol during bolus administration of intravenous contrast. CONTRAST:  177mL ISOVUE-300 IOPAMIDOL (ISOVUE-300) INJECTION 61% COMPARISON:  CT of the chest abdomen and pelvis on 06/27/2016 and CT abdomen and pelvis on 07/07/2016 FINDINGS: CT CHEST FINDINGS Cardiovascular: Again demonstrated is a type B aortic dissection. Flap appears stable, beginning just distal to the left subclavian artery. 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 Heart size is normal. Mitral calcifications are present. No pericardial effusion. Great vessels are atherosclerotic but not aneurysmal. The dissection flap does not extend into the great vessels. A right central line extends into the superior vena cava. Pulmonary arteries have a normal appearance given the timing of the contrast bolus. Mediastinum/Nodes: The visualized portion of the thyroid gland has a normal appearance. No mediastinal, hilar, or axillary adenopathy. Esophagus is normal in appearance. Lungs/Pleura: Increased left pleural effusion. There is new left lower lobe consolidation associated with air bronchograms. Focal areas of atelectasis or infiltrate are identified within the lingula and right lower lobe. No suspicious pulmonary nodules. Musculoskeletal: No chest wall mass or suspicious bone lesions identified. CT ABDOMEN PELVIS FINDINGS Hepatobiliary: Status post cholecystectomy. Liver is normal in appearance. Pancreas: Unremarkable. No pancreatic ductal dilatation or surrounding inflammatory changes. Spleen: Normal in size without focal abnormality. Adrenals/Urinary Tract: Stable appearance of numerous left adrenal nodules, largest measuring 2.7 cm. The right adrenal gland is normal in appearance. Normal enhancement and excretion from the right kidney. Majority of the left kidney appears infarcted with no excretion in demonstrate on the delayed images. Stomach/Bowel: The stomach has a normal appearance. There is dilatation of small bowel loops  common beginning at the proximal jejunum. Loops are fluid-filled without significant wall thickening. There is similar dilatation and fluid-filled appearance of large bowel loops. Findings most consistent with ileus. No transition zone or mass identified. Vascular/Lymphatic: Type B dissection of the aortic continues into the iliac  vessels. There is occlusion of the true lumen distal to this superior mesenteric artery. The right renal artery perfuses from the false lumen and opacifies with contrast. The dual left renal arteries at their origin near the dissection flap and are less well seen compared the prior study. Superior mesenteric artery and celiac axis are well opacified. Contrast is identified within the inferior mesenteric artery, likely from retrograde filling. Reproductive: Status post hysterectomy.  No adnexal mass. Other: No abdominal wall hernia or abnormality. No abdominopelvic ascites. Musculoskeletal: No acute or significant osseous findings. IMPRESSION: 1. Stable appearance of type B dissection. 2. Aortic aneurysm NOS (ICD10-I71.9). Distal aortic arch is stable at 3.5 cm. Recommend annual imaging followup by CTA or MRA. This recommendation follows 2010 ACCF/AHA/AATS/ACR/ASA/SCA/SCAI/SIR/STS/SVM Guidelines for the Diagnosis and Management of Patients with Thoracic Aortic Disease. Circulation.2010; 121: W098-J191 3. Interval development of left pleural effusion and left lower lobe consolidation. 4. Infarcted left kidney. 5. Dilated fluid filled large and small bowel consistent with ileus. No evidence for obstruction or mass. 6. Stable appearance of left adrenal nodules, consistent with benign process. 7. Status post hysterectomy. Electronically Signed   By: Nolon Nations M.D.   On: 07/18/2016 16:55   Dg Abd Portable 1v  Result Date: 07/18/2016 CLINICAL DATA:  Ileus EXAM: PORTABLE ABDOMEN - 1 VIEW COMPARISON:  07/17/2016 FINDINGS: Dilated large and small bowel loops unchanged. Colonic mucosal  thickening unchanged suggesting colitis. Surgical clips in the gallbladder fossa IMPRESSION: Adynamic ileus unchanged.  Colonic mucosal edema unchanged. Electronically Signed   By: Franchot Gallo M.D.   On: 07/18/2016 08:40    Anti-infectives: Anti-infectives    Start     Dose/Rate Route Frequency Ordered Stop   07/18/16 0000  levofloxacin (LEVAQUIN) IVPB 500 mg  Status:  Discontinued     500 mg 100 mL/hr over 60 Minutes Intravenous Every 24 hours 07/17/16 0903 07/17/16 0938   07/17/16 1100  vancomycin (VANCOCIN) 1,500 mg in sodium chloride 0.9 % 500 mL IVPB     1,500 mg 250 mL/hr over 120 Minutes Intravenous Every 24 hours 07/17/16 0946 07/24/16 1059   07/17/16 1000  levofloxacin (LEVAQUIN) tablet 500 mg  Status:  Discontinued     500 mg Oral Daily 07/17/16 0851 07/17/16 0903   07/15/16 0900  ciprofloxacin (CIPRO) tablet 500 mg  Status:  Discontinued     500 mg Oral 2 times daily 07/15/16 0828 07/17/16 0851   07/14/16 1115  levofloxacin (LEVAQUIN) tablet 500 mg  Status:  Discontinued     500 mg Oral Daily 07/14/16 1107 07/14/16 1214   06/26/16 0830  meropenem (MERREM) 1 g in sodium chloride 0.9 % 100 mL IVPB  Status:  Discontinued     1 g 200 mL/hr over 30 Minutes Intravenous Every 8 hours 06/26/16 0810 07/06/16 0804   06/22/16 1800  clindamycin (CLEOCIN) IVPB 600 mg  Status:  Discontinued     600 mg 100 mL/hr over 30 Minutes Intravenous Every 8 hours 06/22/16 1723 06/26/16 0810   06/22/16 1000  azithromycin (ZITHROMAX) 250 mg in dextrose 5 % 125 mL IVPB  Status:  Discontinued     250 mg 125 mL/hr over 60 Minutes Intravenous Every 24 hours 06/22/16 0826 06/27/16 1019   06/22/16 0900  levofloxacin (LEVAQUIN) IVPB 500 mg  Status:  Discontinued     500 mg 100 mL/hr over 60 Minutes Intravenous Every 24 hours 06/22/16 0759 06/22/16 0824      Assessment/Plan: s/p  Overall the patient clinically looks worse to me,  but does not require a laparotomy at this time.    LOS: 29 days    Kathryne Eriksson. Dahlia Bailiff, MD, FACS [phone] [facility] Surgery 07/19/2016

## 2016-07-19 NOTE — Plan of Care (Signed)
Problem: Activity: Goal: Risk for activity intolerance will decrease Outcome: Progressing Ambulate 372ft 1x

## 2016-07-19 NOTE — Progress Notes (Signed)
Subjective: Interval History: none.. Denies pain this morning. Anxious. Some increased work of breathing.  Objective: Vital signs in last 24 hours: Temp:  [98 F (36.7 C)-99.7 F (37.6 C)] 98.7 F (37.1 C) (06/17 0827) Pulse Rate:  [97-127] 116 (06/17 0753) Resp:  [23-39] 30 (06/17 0753) BP: (106-149)/(42-66) 140/60 (06/17 0753) SpO2:  [91 %-100 %] 96 % (06/17 0753)  Intake/Output from previous day: 06/16 0701 - 06/17 0700 In: 2302.8 [I.V.:2152.8; IV Piggyback:150] Out: 1050 [Urine:950; Stool:100] Intake/Output this shift: No intake/output data recorded.  Abdomen distended. Nontender.  Lab Results:  Recent Labs  07/18/16 0337 07/19/16 0420  WBC 9.5 8.1  HGB 7.7* 6.8*  HCT 22.8* 21.1*  PLT 148* 147*   BMET  Recent Labs  07/18/16 0337 07/19/16 0420  NA 132* 132*  K 4.6 4.8  CL 111 109  CO2 16* 17*  GLUCOSE 151* 212*  BUN 26* 24*  CREATININE 1.15* 1.23*  CALCIUM 7.4* 8.3*    Studies/Results: Ct Abdomen Pelvis Wo Contrast  Result Date: 06/26/2016 CLINICAL DATA:  Shortness of breath EXAM: CT CHEST, ABDOMEN AND PELVIS WITHOUT CONTRAST TECHNIQUE: Multidetector CT imaging of the chest, abdomen and pelvis was performed following the standard protocol without IV contrast. COMPARISON:  06/20/2016 chest CT FINDINGS: CT CHEST FINDINGS Cardiovascular: Normal heart size. No pericardial effusion. 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 Mild haziness around the upper descending segment is likely stable. Stable maximal diameter of 33 mm at this level. No intramural hematoma. Intermittently seen displaced intimal calcification. Mediastinum/Nodes: Negative for adenopathy. Right upper extremity PICC with tip at the SVC level. Lungs/Pleura: Multi segment atelectasis, worse on the left. Small pleural effusions. There is patchy ground-glass airspace density in the bilateral lungs without gradient. Airways are clear and there is no septal thickening.  Musculoskeletal: No acute or aggressive finding. CT ABDOMEN PELVIS FINDINGS Hepatobiliary: Hepatic steatosis.Cholecystectomy with normal common bile duct diameter. Pancreas: Unremarkable. Spleen: Unremarkable. 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 The larger measures up to 27 mm and is indeterminate by densitometry, but left adrenal mass has been noted since at least 2006 abdominal CT report, images not available. Left renal cyst. No hydronephrosis or urolithiasis. 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 Negative decompressed urinary bladder. Stomach/Bowel: There is diffuse small bowel distention and fluid filling with mildly hazy mesenteries. Similar features in the colon proximally and at the transverse segment. No pneumatosis or perforation is noted. Nasogastric tube tip is at the pylorus. Vascular/Lymphatic: There is intimal flap displacement from known dissection, morphology appearing similar to prior. No mass or adenopathy. Reproductive:Hysterectomy.  Unremarkable ovaries. Other: No ascites or pneumoperitoneum.  Anasarca. Musculoskeletal: No acute abnormalities. These results were called by telephone at the time of interpretation on 06/26/2016 at 2:05 pm to Dr. Ivin Poot , who verbally acknowledged these results. IMPRESSION: 1. Diffuse airspace disease. Pattern can be seen with noncardiogenic edema (ARDS), inflammatory pneumonitis, or atypical infection. Multi segment atelectasis at the bases and small pleural effusions. 2. Diffuse small bowel and colonic distention with fluid levels as seen with ileus. Question underlying bowel ischemia given the patient's known aortic dissection with proximal IMA occlusion and SMA flow via the narrow true lumen. 3. Incidental findings noted above. Electronically Signed   By: Monte Fantasia M.D.   On: 06/26/2016 14:05   Ct Chest Wo Contrast  Result Date:  06/26/2016 CLINICAL DATA:  Shortness of breath  EXAM: CT CHEST, ABDOMEN AND PELVIS WITHOUT CONTRAST TECHNIQUE: Multidetector CT imaging of the chest, abdomen and pelvis was performed following the standard protocol without IV contrast. COMPARISON:  06/20/2016 chest CT FINDINGS: CT CHEST FINDINGS Cardiovascular: Normal heart size. No pericardial effusion. 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 Mild haziness around the upper descending segment is likely stable. Stable maximal diameter of 33 mm at this level. No intramural hematoma. Intermittently seen displaced intimal calcification. Mediastinum/Nodes: Negative for adenopathy. Right upper extremity PICC with tip at the SVC level. Lungs/Pleura: Multi segment atelectasis, worse on the left. Small pleural effusions. There is patchy ground-glass airspace density in the bilateral lungs without gradient. Airways are clear and there is no septal thickening. Musculoskeletal: No acute or aggressive finding. CT ABDOMEN PELVIS FINDINGS Hepatobiliary: Hepatic steatosis.Cholecystectomy with normal common bile duct diameter. Pancreas: Unremarkable. Spleen: Unremarkable. 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 The larger measures up to 27 mm and is indeterminate by densitometry, but left adrenal mass has been noted since at least 2006 abdominal CT report, images not available. Left renal cyst. No hydronephrosis or urolithiasis. 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 Negative decompressed urinary bladder. Stomach/Bowel: There is diffuse small bowel distention and fluid filling with mildly hazy mesenteries. Similar features in the colon proximally and at the transverse segment. No pneumatosis or perforation is noted. Nasogastric tube tip is at the pylorus. Vascular/Lymphatic: There is intimal flap displacement from known dissection, morphology appearing  similar to prior. No mass or adenopathy. Reproductive:Hysterectomy.  Unremarkable ovaries. Other: No ascites or pneumoperitoneum.  Anasarca. Musculoskeletal: No acute abnormalities. These results were called by telephone at the time of interpretation on 06/26/2016 at 2:05 pm to Dr. Ivin Poot , who verbally acknowledged these results. IMPRESSION: 1. Diffuse airspace disease. Pattern can be seen with noncardiogenic edema (ARDS), inflammatory pneumonitis, or atypical infection. Multi segment atelectasis at the bases and small pleural effusions. 2. Diffuse small bowel and colonic distention with fluid levels as seen with ileus. Question underlying bowel ischemia given the patient's known aortic dissection with proximal IMA occlusion and SMA flow via the narrow true lumen. 3. Incidental findings noted above. Electronically Signed   By: Monte Fantasia M.D.   On: 06/26/2016 14:05   Ct Chest W Contrast  Result Date: 07/18/2016 CLINICAL DATA:  Pt c/o abdominal pain for several days with nausea ^167mL ISOVUE-300 IOPAMIDOL (ISOVUE-300) INJECTION 61%; Pt c/o abdominal pain for several days Pt c/o abdominal pain for several days with nausea ^11mL ISOVUE-300 IOPAMIDOL (ISOVUE-300) INJECTION 61% EXAM: CT CHEST, ABDOMEN, AND PELVIS WITH CONTRAST TECHNIQUE: Multidetector CT imaging of the chest, abdomen and pelvis was performed following the standard protocol during bolus administration of intravenous contrast. 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 Flap appears stable, beginning just distal to the left subclavian artery. 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 Heart size is normal. Mitral calcifications are present. No pericardial effusion. Great vessels are atherosclerotic but not aneurysmal. The  dissection flap does not extend into the great vessels. A right central line extends into the superior vena cava. Pulmonary arteries have a normal appearance given the timing of the contrast bolus. Mediastinum/Nodes: The visualized portion of the thyroid gland has a normal  appearance. No mediastinal, hilar, or axillary adenopathy. Esophagus is normal in appearance. Lungs/Pleura: Increased left pleural effusion. There is new left lower lobe consolidation associated with air bronchograms. Focal areas of atelectasis or infiltrate are identified within the lingula and right lower lobe. No suspicious pulmonary nodules. Musculoskeletal: No chest wall mass or suspicious bone lesions identified. CT ABDOMEN PELVIS FINDINGS Hepatobiliary: Status post cholecystectomy. Liver is normal in appearance. Pancreas: Unremarkable. No pancreatic ductal dilatation or surrounding inflammatory changes. Spleen: Normal in size without focal abnormality. Adrenals/Urinary Tract: Stable appearance of numerous left adrenal nodules, largest measuring 2.7 cm. The right adrenal gland is normal in appearance. Normal enhancement and excretion from the right kidney. Majority of the left kidney appears infarcted with no excretion in demonstrate on the delayed images. Stomach/Bowel: The stomach has a normal appearance. There is dilatation of small bowel loops common beginning at the proximal jejunum. Loops are fluid-filled without significant wall thickening. There is similar dilatation and fluid-filled appearance of large bowel loops. Findings most consistent with ileus. No transition zone or mass identified. Vascular/Lymphatic: Type B dissection of the aortic continues into the iliac vessels. There is occlusion of the true lumen distal to this superior mesenteric artery. The right renal artery perfuses from the false lumen and opacifies with contrast. The dual left renal arteries at their origin near the dissection flap and are less well seen compared  the prior study. Superior mesenteric artery and celiac axis are well opacified. Contrast is identified within the inferior mesenteric artery, likely from retrograde filling. Reproductive: Status post hysterectomy.  No adnexal mass. Other: No abdominal wall hernia or abnormality. No abdominopelvic ascites. Musculoskeletal: No acute or significant osseous findings. IMPRESSION: 1. Stable appearance of type B dissection. 2. Aortic aneurysm NOS (ICD10-I71.9). Distal aortic arch is stable at 3.5 cm. Recommend annual imaging followup by CTA or MRA. This recommendation follows 2010 ACCF/AHA/AATS/ACR/ASA/SCA/SCAI/SIR/STS/SVM Guidelines for the Diagnosis and Management of Patients with Thoracic Aortic Disease. Circulation.2010; 121: B017-P102 3. Interval development of left pleural effusion and left lower lobe consolidation. 4. Infarcted left kidney. 5. Dilated fluid filled large and small bowel consistent with ileus. No evidence for obstruction or mass. 6. Stable appearance of left adrenal nodules, consistent with benign process. 7. Status post hysterectomy. Electronically Signed   By: Nolon Nations M.D.   On: 07/18/2016 16:55   Ct Abdomen Pelvis W Contrast  Result Date: 07/18/2016 CLINICAL DATA:  Pt c/o abdominal pain for several days with nausea ^159mL ISOVUE-300 IOPAMIDOL (ISOVUE-300) INJECTION 61%; Pt c/o abdominal pain for several days Pt c/o abdominal pain for several days with nausea ^116mL ISOVUE-300 IOPAMIDOL (ISOVUE-300) INJECTION 61% EXAM: CT CHEST, ABDOMEN, AND PELVIS WITH CONTRAST TECHNIQUE: Multidetector CT imaging of the chest, abdomen and pelvis was performed following the standard protocol during bolus administration of intravenous contrast. 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  Flap appears stable, beginning just distal to the left subclavian artery. 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 Heart size is normal. Mitral calcifications are present. No pericardial effusion. Great vessels are atherosclerotic but not aneurysmal. The dissection flap does not extend into the great vessels. A right central line extends into the superior vena cava. Pulmonary arteries have a normal appearance given the timing of the contrast bolus. Mediastinum/Nodes: The visualized portion of the thyroid gland has a normal appearance.  No mediastinal, hilar, or axillary adenopathy. Esophagus is normal in appearance. Lungs/Pleura: Increased left pleural effusion. There is new left lower lobe consolidation associated with air bronchograms. Focal areas of atelectasis or infiltrate are identified within the lingula and right lower lobe. No suspicious pulmonary nodules. Musculoskeletal: No chest wall mass or suspicious bone lesions identified. CT ABDOMEN PELVIS FINDINGS Hepatobiliary: Status post cholecystectomy. Liver is normal in appearance. Pancreas: Unremarkable. No pancreatic ductal dilatation or surrounding inflammatory changes. Spleen: Normal in size without focal abnormality. Adrenals/Urinary Tract: Stable appearance of numerous left adrenal nodules, largest measuring 2.7 cm. The right adrenal gland is normal in appearance. Normal enhancement and excretion from the right kidney. Majority of the left kidney appears infarcted with no excretion in demonstrate on the delayed images. Stomach/Bowel: The stomach has a normal appearance. There is dilatation of small bowel loops common beginning at the proximal jejunum. Loops are fluid-filled without significant wall thickening. There is similar dilatation and fluid-filled appearance of large bowel loops. Findings most consistent with ileus. No transition zone or mass identified. Vascular/Lymphatic: Type B dissection of the aortic continues into the iliac vessels. There  is occlusion of the true lumen distal to this superior mesenteric artery. The right renal artery perfuses from the false lumen and opacifies with contrast. The dual left renal arteries at their origin near the dissection flap and are less well seen compared the prior study. Superior mesenteric artery and celiac axis are well opacified. Contrast is identified within the inferior mesenteric artery, likely from retrograde filling. Reproductive: Status post hysterectomy.  No adnexal mass. Other: No abdominal wall hernia or abnormality. No abdominopelvic ascites. Musculoskeletal: No acute or significant osseous findings. IMPRESSION: 1. Stable appearance of type B dissection. 2. Aortic aneurysm NOS (ICD10-I71.9). Distal aortic arch is stable at 3.5 cm. Recommend annual imaging followup by CTA or MRA. This recommendation follows 2010 ACCF/AHA/AATS/ACR/ASA/SCA/SCAI/SIR/STS/SVM Guidelines for the Diagnosis and Management of Patients with Thoracic Aortic Disease. Circulation.2010; 121: R518-A416 3. Interval development of left pleural effusion and left lower lobe consolidation. 4. Infarcted left kidney. 5. Dilated fluid filled large and small bowel consistent with ileus. No evidence for obstruction or mass. 6. Stable appearance of left adrenal nodules, consistent with benign process. 7. Status post hysterectomy. Electronically Signed   By: Nolon Nations M.D.   On: 07/18/2016 16:55   Ct Abdomen Pelvis W Contrast  Result Date: 07/07/2016 CLINICAL DATA:  61 year old female with abdominal pain and distension greater on the right. Subsequent encounter. EXAM: CT ABDOMEN AND PELVIS WITH CONTRAST TECHNIQUE: Multidetector CT imaging of the abdomen and pelvis was performed using the standard protocol following bolus administration of intravenous contrast. CONTRAST:  75 cc Isovue 300. COMPARISON:  07/06/2016 plain film exam. 06/27/2016 CT angiogram chest, abdomen and pelvis. 10/27/2011 FINDINGS: Lower chest: Basilar atelectasis  greatest medial left lung base. Heart top-normal size with aortic/mitral valve calcification. Hepatobiliary: Mild fatty infiltration of the liver without worrisome hepatic lesion. Post cholecystectomy. Pancreas: No mass or inflammation.  No duct dilation. Spleen: No mass or enlargement. Adrenals/Urinary Tract: Prominent infarct left kidney. Left renal cyst. Nodularity adrenal glands greater on the left has changed slightly since 2013 with largest left adrenal nodule currently measuring 2.6 x 2.5 x 1.9 cm versus on 2013 exam 2.4 x 2.3 x 1.9 cm Stomach/Bowel: Fluid and gas-filled prominent size small bowel without point of transition noted. Fluid-filled featureless colon. Minimal amount of fluid adjacent to small bowel loops and minimal hazy infiltration of fat planes surrounding the descending colon. No pneumatosis or  free intraperitoneal air. Findings may reflect changes of enterocolitis of indeterminate etiology whether ischemic, inflammatory or infectious. No pneumatosis or free intraperitoneal air. 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 Dissection extends into iliac arteries. Inferior mesenteric artery may fill in a retrograde fashion. Appearance unchanged from recent CT angiogram. Scattered normal size lymph nodes. Reproductive: No worrisome abnormality. Foley catheter with decompressed urinary bladder. Other: No bowel containing hernia. Musculoskeletal: No acute osseous abnormality. IMPRESSION: Fluid and gas-filled prominent size small bowel without worrisome point of transition. Fluid-filled featureless colon. Minimal amount of fluid adjacent to small bowel loops and minimal hazy infiltration of fat planes surrounding the descending colon. No pneumatosis or free intraperitoneal air. Findings may reflect changes of enterocolitis of indeterminate etiology whether ischemic, inflammatory or infectious. No pneumatosis  or free intraperitoneal air. 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 Infarction majority of the left kidney. Adrenal gland nodularity greater on left stable since recent exams and minimally changed since 2013 as detailed above. Electronically Signed   By: Genia Del M.D.   On: 07/07/2016 14:22   Dg Chest Port 1 View  Result Date: 07/13/2016 CLINICAL DATA:  Follow-up examination for aortic dissection. EXAM: PORTABLE CHEST 1 VIEW COMPARISON:  Prior radiograph from 07/08/2016. FINDINGS: Enteric tube courses in the the abdomen. Right-sided PICC catheter remains in place with tip overlying the cavoatrial junction. Stable cardiomegaly. Mediastinal silhouette unchanged and within normal limits. Lungs hypoinflated. Persistent patchy and linear left basilar opacity, which may reflect atelectasis or infiltrate. Previously seen left pleural effusion has largely resolved. No new focal airspace opacity. No pulmonary edema. No pneumothorax. No acute osseus abnormality. IMPRESSION: 1. Interval decrease with near resolution of left pleural effusion. Persistent patchy left basilar opacity also improved, and may reflect residual atelectasis and/or infiltrate. 2. No other new cardiopulmonary finding. Electronically Signed   By: Jeannine Boga M.D.   On: 07/13/2016 06:42   Dg Chest Port 1 View  Result Date: 07/08/2016 CLINICAL DATA:  Shortness of Breath EXAM: PORTABLE CHEST 1 VIEW COMPARISON:  July 04, 2016 FINDINGS: Central catheter tip is in the superior vena cava, stable. Nasogastric tube tip and side port are in the stomach. No pneumothorax. There is persistent airspace consolidation in the left lower lobe with small left pleural effusion. Right lung is clear. Heart is upper normal in size with pulmonary vascularity within normal limits. No adenopathy. No evident bone lesions. IMPRESSION: Tube and catheter positions as  described without pneumothorax. Persistent left lower lobe consolidation with small left pleural effusion. No new parenchymal lung opacity. Stable cardiac silhouette. Electronically Signed   By: Lowella Grip III M.D.   On: 07/08/2016 08:00   Dg Chest Port 1 View  Result Date: 07/04/2016 CLINICAL DATA:  Followup shortness of breath.  Aortic dissection. EXAM: PORTABLE CHEST 1 VIEW COMPARISON:  07/01/2016 FINDINGS: Nasogastric tube enters the stomach. Right arm PICC tip is in the SVC above right atrium. Less pulmonary edema. Persistent left effusion. Persistent volume loss in both lower lobes left worse than right. Chronic prominence of the aortic shadow consistent with the history of dissection. IMPRESSION: Less pulmonary edema. Persistent left effusion. Persistent atelectasis in both lower lobes left more than right. Persistent prominent aortic/mediastinal shadow. Electronically Signed   By: Nelson Chimes M.D.   On: 07/04/2016 08:11   Dg Chest Port 1 View  Result Date: 07/01/2016  CLINICAL DATA:  Shortness of breath common pneumonia, thoracic aortic dissection EXAM: PORTABLE CHEST 1 VIEW COMPARISON:  Portable chest x-ray of Jun 30, 2016 FINDINGS: The lungs are adequately inflated. The pulmonary interstitial markings are less prominent. The retrocardiac region remains dense and there is remains partial obscuration of the left hemidiaphragm. The heart is top-normal in size. The mediastinum is normal in width. The esophagogastric tube tip projects below the inferior margin of the image. The right PICC line tip projects over the midportion of the SVC. IMPRESSION: Slight interval improvement in the appearance of the pulmonary interstitium suggests decreasing interstitial edema. There is persistent left lower lobe atelectasis or pneumonia. Electronically Signed   By: David  Martinique M.D.   On: 07/01/2016 07:35   Dg Chest Port 1 View  Result Date: 06/30/2016 CLINICAL DATA:  Breath, pneumonia, aortic aneurysm,  GERD, hypertension, diabetes mellitus EXAM: PORTABLE CHEST 1 VIEW COMPARISON:  Portable exam 0633 hours compared to 06/29/2016 FINDINGS: Nasogastric tube coiled in proximal stomach. RIGHT arm PICC line tip projects over SVC above cavoatrial junction. Upper normal heart size. Mediastinal contours normal. Mild pulmonary vascular congestion. Persistent diffuse BILATERAL infiltrates. Atelectasis versus consolidation LEFT lower lobe. No pneumothorax. IMPRESSION: Persistent mild diffuse interstitial infiltrates with atelectasis versus consolidation in LEFT lower lobe. Electronically Signed   By: Lavonia Dana M.D.   On: 06/30/2016 06:58   Dg Chest Port 1 View  Result Date: 06/29/2016 CLINICAL DATA:  Shortness of Breath EXAM: PORTABLE CHEST 1 VIEW COMPARISON:  06/28/2016 FINDINGS: Nasogastric catheter is noted coiled within the stomach. Right-sided PICC line is seen in the distal superior vena cava. Cardiac shadow is mildly enlarged but stable. Bilateral vascular congestion and edema is seen stable from prior exam. Some atelectasis remains in the left retrocardiac region. IMPRESSION: No significant interval change from the prior exam. Electronically Signed   By: Inez Catalina M.D.   On: 06/29/2016 07:36   Dg Chest Port 1 View  Result Date: 06/28/2016 CLINICAL DATA:  Aortic dissection. EXAM: PORTABLE CHEST 1 VIEW COMPARISON:  06/27/2016. FINDINGS: The heart is enlarged. There is moderate vascular congestion. Prominence of the aortic contour reflecting the type B dissection. LEFT lower lobe atelectasis with effusion. IMPRESSION: Cardiomegaly. Aortic dissection. Vascular congestion. LEFT lower lobe atelectasis with effusion. Electronically Signed   By: Staci Righter M.D.   On: 06/28/2016 07:13   Dg Chest Port 1 View  Result Date: 06/27/2016 CLINICAL DATA:  ARDS. EXAM: PORTABLE CHEST 1 VIEW COMPARISON:  Jun 26, 2016 FINDINGS: The right PICC line is stable. The NG tube terminates below today's study. Diffuse bilateral  primarily interstitial opacities are mildly more homogeneous in less patchy in appearance. More focal opacity in the left base is stable. No other changes. IMPRESSION: 1. Diffuse bilateral primarily interstitial opacities suggesting edema remain. More focal opacity in the left base is stable to mildly improved. Electronically Signed   By: Dorise Bullion III M.D   On: 06/27/2016 07:21   Dg Chest Port 1 View  Result Date: 06/26/2016 CLINICAL DATA:  Shortness of breath. EXAM: PORTABLE CHEST 1 VIEW COMPARISON:  Radiograph of Jun 25, 2016. FINDINGS: Stable cardiomegaly. Increased bilateral diffuse interstitial and basilar opacities are noted concerning for worsening edema. No pneumothorax is noted. Mild left pleural effusion is noted with associated atelectasis or infiltrate. Right-sided PICC line is unchanged in position. Nasogastric tube is unchanged in position. Bony thorax is unremarkable. IMPRESSION: Increased bilateral diffuse interstitial densities consistent with edema. Mild left pleural effusion is noted with associated  atelectasis or infiltrate. Electronically Signed   By: Marijo Conception, M.D.   On: 06/26/2016 07:40   Dg Chest Port 1 View  Result Date: 06/25/2016 CLINICAL DATA:  Shortness of Breath EXAM: PORTABLE CHEST 1 VIEW COMPARISON:  06/24/2016 FINDINGS: Cardiac shadow is prominent but stable. Right-sided PICC line and nasogastric catheter are again seen and stable. Bibasilar atelectatic changes are noted worse on the left than the right and increased from the prior exam. Small left pleural effusion is noted as well. Mild vascular congestion is seen. IMPRESSION: Increasing bibasilar infiltrates left greater than right. Mild vascular congestion is noted. The Electronically Signed   By: Inez Catalina M.D.   On: 06/25/2016 07:54   Dg Chest Port 1 View  Result Date: 06/24/2016 CLINICAL DATA:  Shortness of Breath EXAM: PORTABLE CHEST 1 VIEW COMPARISON:  06/23/2016 FINDINGS: Right-sided PICC line is  again noted at the cavoatrial junction. Nasogastric catheter is noted in satisfactory position through the overall inspiratory effort is decreased from the prior exam. Mild left basilar atelectasis remains. No other focal abnormality is noted. IMPRESSION: Stable left basilar atelectasis. Electronically Signed   By: Inez Catalina M.D.   On: 06/24/2016 08:07   Dg Chest Port 1 View  Result Date: 06/23/2016 CLINICAL DATA:  61 year old female with shortness of breath and abdominal pain. 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 Being treated conservatively at this time, including with bowel rest. EXAM: PORTABLE CHEST 1 VIEW COMPARISON:  Chest radiographs 06/22/2016 and earlier. CTA 06/20/2016 FINDINGS: Portable AP semi upright view at 0754 hours. Stable enteric tube which courses to the abdomen. Stable right PICC line. Mildly larger lung volumes and decreased streaky bibasilar opacity. Mild residual. No pneumothorax or pulmonary edema. No pleural effusion or consolidation. Stable cardiac size and mediastinal contours. IMPRESSION: 1.  Stable lines and tubes. 2. Mildly improved lung volumes and regressed bibasilar opacity favored to be atelectasis. Electronically Signed   By: Genevie Ann M.D.   On: 06/23/2016 08:19   Dg Chest Port 1 View  Result Date: 06/22/2016 CLINICAL DATA:  61 year old female with history of abdominal aortic aneurysm. Shortness of breath and vomiting. EXAM: PORTABLE CHEST 1 VIEW COMPARISON:  Chest x-ray 06/21/2016. FINDINGS: There is a right upper extremity PICC with tip terminating in the superior cavoatrial junction. A nasogastric tube is seen extending into the stomach, however, the tip of the nasogastric tube extends below the lower margin of the image. Lung volumes are low. Linear bibasilar opacities favored to reflect areas of subsegmental atelectasis, however, underlying airspace  consolidation from infection or aspiration is not excluded. No pleural effusions. No evidence of pulmonary edema. Heart size is normal. Upper mediastinal contours are within normal limits. IMPRESSION: 1. Support apparatus, as above. 2. Low lung volumes with bibasilar areas of atelectasis and/or airspace consolidation. Electronically Signed   By: Vinnie Langton M.D.   On: 06/22/2016 07:30   Dg Chest Port 1 View  Result Date: 06/21/2016 CLINICAL DATA:  Aortic dissection EXAM: PORTABLE CHEST 1 VIEW COMPARISON:  06/20/2016 FINDINGS: Bibasilar atelectasis. Heart is normal size. No effusions or acute bony abnormality. IMPRESSION: Bibasilar atelectasis. Electronically Signed   By: Rolm Baptise M.D.   On: 06/21/2016 07:18   Dg Chest Port 1 View  Result Date: 06/20/2016 CLINICAL DATA:  Hypoxia, unresponsive EXAM: PORTABLE CHEST 1 VIEW COMPARISON:  CTA chest dated 06/20/2016 FINDINGS: Lungs are essentially clear. No focal consolidation. No  pleural effusion or pneumothorax. The heart is top-normal in size. IMPRESSION: No evidence of acute cardiopulmonary disease. Electronically Signed   By: Julian Hy M.D.   On: 06/20/2016 17:01   Dg Chest Port 1 View  Result Date: 06/20/2016 CLINICAL DATA:  Sudden onset sharp substernal chest pain that radiates to the back. Nausea, dizziness and shortness of breath. EXAM: PORTABLE CHEST 1 VIEW COMPARISON:  None. FINDINGS: Trachea is midline. Heart size is accentuated by AP supine technique. Probable mild scarring in the right middle lobe and lingula. Lungs are otherwise clear. No pleural fluid. IMPRESSION: No acute findings. Electronically Signed   By: Lorin Picket M.D.   On: 06/20/2016 09:56   Dg Abd Portable 1v  Result Date: 07/19/2016 CLINICAL DATA:  Ileus EXAM: PORTABLE ABDOMEN - 1 VIEW COMPARISON:  CT of the abdomen and pelvis on 07/18/2016 FINDINGS: There is moderate persistent dilatation of both large and small bowel loops consistent with ileus. No evidence  for free intraperitoneal air. IMPRESSION: Unchanged appearance of ileus pattern. Electronically Signed   By: Nolon Nations M.D.   On: 07/19/2016 08:28   Dg Abd Portable 1v  Result Date: 07/18/2016 CLINICAL DATA:  Ileus EXAM: PORTABLE ABDOMEN - 1 VIEW COMPARISON:  07/17/2016 FINDINGS: Dilated large and small bowel loops unchanged. Colonic mucosal thickening unchanged suggesting colitis. Surgical clips in the gallbladder fossa IMPRESSION: Adynamic ileus unchanged.  Colonic mucosal edema unchanged. Electronically Signed   By: Franchot Gallo M.D.   On: 07/18/2016 08:40   Dg Abd Portable 1v  Result Date: 07/17/2016 CLINICAL DATA:  Follow-up ileus EXAM: PORTABLE ABDOMEN - 1 VIEW COMPARISON:  07/16/2016 FINDINGS: Scattered large and small bowel gas is noted. Small bowel dilatation is again identified and stable. No free air is seen. No abnormal mass is noted. Changes of prior cholecystectomy are seen. No bony abnormality is noted. IMPRESSION: Stable distension of the small bowel. Continued follow-up is recommended. Electronically Signed   By: Inez Catalina M.D.   On: 07/17/2016 07:52   Dg Abd Portable 1v  Result Date: 07/16/2016 CLINICAL DATA:  Abdominal pain and distention. EXAM: PORTABLE ABDOMEN - 1 VIEW COMPARISON:  Single-view of the abdomen 07/13/2016 and 07/11/2016. FINDINGS: NG tube is no longer visualized. There is unchanged gaseous distention of small and large bowel. IMPRESSION: NG tube is no longer visualized. No change in gas distention of bowel suggestive of ileus. Electronically Signed   By: Inge Rise M.D.   On: 07/16/2016 07:53   Dg Abd Portable 1v  Result Date: 07/13/2016 CLINICAL DATA:  Initial evaluation for aortic dissection, abdominal distension. EXAM: PORTABLE ABDOMEN - 1 VIEW COMPARISON:  Prior radiograph from 07/11/2016. FINDINGS: Enteric to is scored within the stomach. Cholecystectomy clips noted. There are persistent scattered gas-filled loops of bowel within the abdomen.  Overall volume gas is slightly decreased from prior, although the bowel gas pattern has not significantly changed. IMPRESSION: 1. Persistent gas-filled loops of small and large bowel within the abdomen, suggesting small and large bowel ileus. Overall, degree of gaseous distention is slightly improved from previous. 2. NG tube coiled within the stomach. Electronically Signed   By: Jeannine Boga M.D.   On: 07/13/2016 06:39   Dg Abd Portable 1v  Result Date: 07/11/2016 CLINICAL DATA:  61 year old female with type B aortic dissection, left renal infarcts, and bowel ileus. EXAM: PORTABLE ABDOMEN - 1 VIEW COMPARISON:  07/10/2016, CT Abdomen and Pelvis 07/07/2016, and earlier FINDINGS: KUB view of the abdomen at 0528 hours. NG tube looped in  the stomach. Stable cholecystectomy clips. Continued gas-filled prominent large and small bowel loops throughout the abdomen. Mildly increased volume of bowel gas but otherwise the bowel gas pattern has not changed since 07/07/2016 where air-fluid levels were demonstrated by CT in both large and small bowel. IMPRESSION: 1. Stable bowel gas pattern since 07/07/2016 suggesting small and large bowel ileus. 2. Stable NG tube in the stomach. Electronically Signed   By: Genevie Ann M.D.   On: 07/11/2016 07:29   Dg Abd Portable 1v  Result Date: 07/10/2016 CLINICAL DATA:  Ileus.  Aortic dissection. EXAM: PORTABLE ABDOMEN - 1 VIEW COMPARISON:  CT scan July 07, 2016.  KUB July 08, 2016 FINDINGS: Persistent small bowel dilatation measuring up to 4.5 cm, mildly more prominent in the interval. The transverse colon is also air-filled but nondilated. The descending colon is relatively decompressed. An NG tube terminates in the stomach. No free air, portal venous gas, or pneumatosis on supine imaging. IMPRESSION: 1. The dilated small bowel loops are mildly more prominent in the interval. Electronically Signed   By: Dorise Bullion III M.D   On: 07/10/2016 07:28   Dg Abd Portable 1v  Result  Date: 07/08/2016 CLINICAL DATA:  Abdominal distention EXAM: PORTABLE ABDOMEN - 1 VIEW COMPARISON:  July 07, 2016 FINDINGS: There may be slightly less bowel dilatation compared to 1 day prior. No air-fluid levels evident. No free air. Nasogastric tube tip and side port remain in stomach. IMPRESSION: Probable ileus with marginally less bowel dilatation compared to 1 day prior. No free air. Nasogastric tube tip and side port in stomach. Electronically Signed   By: Lowella Grip III M.D.   On: 07/08/2016 08:00   Dg Abd Portable 1v  Result Date: 07/07/2016 CLINICAL DATA:  Feeding tube placement. EXAM: PORTABLE ABDOMEN - 1 VIEW COMPARISON:  07/06/2016 FINDINGS: Interval placement of enteric tube which enters the stomach where it is coiled once and has tip and side-port over the gastric fundus just below the left hemidiaphragm. Air-filled loops of large and small bowel with dilated small bowel loop in the left abdomen measuring 5.7 cm in diameter as findings are not significantly changed. Remainder of the exam is unchanged. IMPRESSION: Persistent air-filled dilated small bowel with air throughout the colon. Nasogastric tube coiled once within the stomach with tip and side-port over the gastric fundus just below the left hemidiaphragm. Electronically Signed   By: Marin Olp M.D.   On: 07/07/2016 19:16   Dg Abd Portable 1v  Result Date: 07/06/2016 CLINICAL DATA:  Multiple episodes of vomiting, abdominal pain and distention. EXAM: PORTABLE ABDOMEN - 1 VIEW COMPARISON:  07/04/2016 FINDINGS: There is persistent dilation and wall edema of small bowel loops, with featureless appearance of some of the visualized small bowel loops. No radiographic evidence of organomegaly or free gas on this limited supine radiograph. IMPRESSION: Persistent dilation/wall edema and featureless appearance of small bowel loops in a nonspecific pattern. These findings may be seen with ileus, incomplete small bowel obstruction or enteritis.  Ischemic enteritis continues to be of consideration. Electronically Signed   By: Fidela Salisbury M.D.   On: 07/06/2016 16:14   Dg Abd Portable 1v  Result Date: 07/04/2016 CLINICAL DATA:  Aortic dissection. EXAM: PORTABLE ABDOMEN - 1 VIEW COMPARISON:  07/02/2016 FINDINGS: Nasogastric tube tip in the region of the antrum/pylorus. Persistent group of dilated small bowel loops in the left mid abdomen with possible bowel edema. Possibility of bowel ischemia certainly does exist. Similar appearance to the study of 07/02/2016.  IMPRESSION: Nasogastric tube in place. Otherwise similar appearance with abnormal small bowel pattern in the left central abdomen which could go along with partial small bowel obstruction or a focal enteritis including ischemic. Electronically Signed   By: Nelson Chimes M.D.   On: 07/04/2016 08:13   Dg Abd Portable 1v  Result Date: 07/02/2016 CLINICAL DATA:  Nasogastric tube removed.  Acute abdominal pain. EXAM: PORTABLE ABDOMEN - 1 VIEW COMPARISON:  06/27/2016 FINDINGS: Nasogastric tube is been removed. There is a group of dilated fluid and air-filled loops of small intestine in the left abdomen with apparent wall thickening. This could be due to partial obstruction or enteritis, including ischemic bowel insult. IMPRESSION: Nasogastric tube removed. Abnormal dilated small intestine in the left central abdomen. Differential diagnosis partial small bowel obstruction versus enteritis, including ischemic. Electronically Signed   By: Nelson Chimes M.D.   On: 07/02/2016 07:37   Dg Abd Portable 1v  Result Date: 06/27/2016 CLINICAL DATA:  Ileus. EXAM: PORTABLE ABDOMEN - 1 VIEW COMPARISON:  Jun 26, 2016 FINDINGS: The NG tube terminates within the stomach. The bowel gas pattern is unremarkable on today's study. No other acute abnormalities. IMPRESSION: The NG tube terminates within the stomach. The bowel gas pattern is unremarkable. Electronically Signed   By: Dorise Bullion III M.D   On:  06/27/2016 07:19   Dg Abd Portable 1v  Result Date: 06/26/2016 CLINICAL DATA:  Shortness of breath, abdominal pain EXAM: PORTABLE ABDOMEN - 1 VIEW COMPARISON:  06/25/2016 FINDINGS: NG tube tip is in the distal stomach. Prior cholecystectomy. Nonobstructive bowel gas pattern. No free air organomegaly. IMPRESSION: NG tube tip in the distal stomach.  No acute findings. Electronically Signed   By: Rolm Baptise M.D.   On: 06/26/2016 07:36   Dg Abd Portable 1v  Result Date: 06/25/2016 CLINICAL DATA:  Shortness of breath.  Abdominal pain. EXAM: PORTABLE ABDOMEN - 1 VIEW COMPARISON:  Jun 24, 2016 FINDINGS: The distal tip of the NG tube is near the gastric antrum. Mild opacity in left lung base will be better assessed on today's chest x-ray. No free air or portal venous gas identified although evaluation is limited on supine imaging. No bowel obstruction. IMPRESSION: No interval change or acute abnormality in the abdomen. Electronically Signed   By: Dorise Bullion III M.D   On: 06/25/2016 07:54   Dg Abd Portable 1v  Result Date: 06/24/2016 CLINICAL DATA:  Abdominal distention. EXAM: PORTABLE ABDOMEN - 1 VIEW COMPARISON:  06/23/2016 . FINDINGS: NG tube noted in stable position with tip in the stomach . Surgical clips right upper quadrant. Several nonspecific loops of air-filled small bowel again noted. Colonic gas pattern is normal. No free air. IMPRESSION: 1.  NG tube in stable position. 2. Several nonspecific loops of air-filled small bowel again noted. No evidence of progressive bowel distention. Colonic gas pattern normal. No free air. Electronically Signed   By: Marcello Moores  Register   On: 06/24/2016 08:06   Dg Abd Portable 1v  Result Date: 06/23/2016 CLINICAL DATA:  61 year old female with shortness of breath and abdominal pain. 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 Being treated  conservatively at this time, including with bowel rest. EXAM: PORTABLE ABDOMEN - 1 VIEW COMPARISON:  Abdominal radiographs 06/22/2016. CTA 06/20/2016, and earlier. FINDINGS: Portable AP supine view at 0759 hours. Stable NG tube, side hole to level of the gastric body and tip at the level of  the gastric antrum. Stable cholecystectomy clips. Stable bowel gas pattern with several gas containing nondilated small and large bowel loops. No definite pneumoperitoneum on this supine view. Abdominal and pelvic visceral contours appear stable. No acute osseous abnormality identified. IMPRESSION: 1. Stable NG tube position. 2. Stable, nonobstructed bowel-gas pattern. Electronically Signed   By: Genevie Ann M.D.   On: 06/23/2016 08:18   Dg Abd Portable 1v  Result Date: 06/22/2016 CLINICAL DATA:  Abdominal pain for few days with nausea and vomiting. EXAM: PORTABLE ABDOMEN - 1 VIEW COMPARISON:  06/22/2016 abdominal radiograph. FINDINGS: Enteric tube loops in the gastric fundus and terminates in the body of the stomach, without appreciable kink. Top-normal caliber small bowel loops throughout the abdomen. Minimal colonic stool. No evidence of pneumatosis or pneumoperitoneum. No radiopaque urolithiasis. Cholecystectomy clips are seen in the right upper quadrant of the abdomen. Patchy opacities at the lung bases. IMPRESSION: 1. Enteric tube loops in the gastric fundus and terminates in the body of the stomach without appreciable kink. 2. Top-normal caliber small bowel loops, unchanged. 3. Patchy bibasilar lung opacities, correlate with chest radiograph. Electronically Signed   By: Ilona Sorrel M.D.   On: 06/22/2016 16:58   Dg Abd Portable 1v  Result Date: 06/22/2016 CLINICAL DATA:  Vomiting.  Shortness of breath.  AAA . EXAM: PORTABLE ABDOMEN - 1 VIEW COMPARISON:  06/21/2016. FINDINGS: Surgical clips right upper quadrant. NG tube noted with tip over the distal stomach. No bowel distention. No acute bony abnormality identified.  Mild basilar atelectasis. IMPRESSION: NG tube noted with its tip over the distal stomach. No bowel distention or acute intra-abdominal abnormality identified. Electronically Signed   By: Marcello Moores  Register   On: 06/22/2016 07:30   Dg Abd Portable 1v  Result Date: 06/21/2016 CLINICAL DATA:  Feeding tube placement EXAM: PORTABLE ABDOMEN - 1 VIEW COMPARISON:  06/21/2016 FINDINGS: NG tube coils in the fundus of the stomach with the tip in the distal stomach. Decompression of the stomach. IMPRESSION: NG tube tip in the distal stomach. Electronically Signed   By: Rolm Baptise M.D.   On: 06/21/2016 15:46   Dg Abd Portable 1v  Result Date: 06/21/2016 CLINICAL DATA:  Ileus  Vomiting that started this AM per patient EXAM: PORTABLE ABDOMEN - 1 VIEW COMPARISON:  06/20/2016 FINDINGS: Gaseous distention of the stomach. Paucity of small bowel and colonic gas. Cholecystectomy clips. Linear scarring/ atelectasis in the lung bases. IMPRESSION: 1. Gaseous distention of the stomach. Electronically Signed   By: Lucrezia Europe M.D.   On: 06/21/2016 11:54   Dg Abd Portable 1v  Result Date: 06/20/2016 CLINICAL DATA:  Aortic dissection EXAM: PORTABLE ABDOMEN - 1 VIEW COMPARISON:  CT abdomen/ pelvis dated 06/20/2016 FINDINGS: Nonobstructive bowel gas pattern. Visualized osseous structures are within normal limits. IMPRESSION: Unremarkable abdominal radiograph. Electronically Signed   By: Julian Hy M.D.   On: 06/20/2016 17:02   Ct Angio Chest/abd/pel For Dissection W And/or W/wo  Result Date: 06/27/2016 CLINICAL DATA:  61 year old female with a history of type B dissection EXAM: CT ANGIOGRAPHY CHEST, ABDOMEN AND PELVIS TECHNIQUE: Multidetector CT imaging through the chest, abdomen and pelvis was performed using the standard protocol during bolus administration of intravenous contrast. Multiplanar reconstructed images and MIPs were obtained and reviewed to evaluate the vascular anatomy. CONTRAST:  100 cc Isovue 370  COMPARISON:  CT 06/20/2016 FINDINGS: CTA CHEST FINDINGS Cardiovascular: Heart: Heart size unchanged. No pericardial fluid/ thickening. No significant coronary calcifications. Aorta: Re- demonstration of type B dissection with the entry  tear appearing to originate just beyond the origin of the left subclavian artery. Branch vessels remain patent without extension of the dissection flap into the branch vessels. Caliber and contour of the ascending aorta unremarkable without evidence of retrograde extension. Greatest diameter of the ascending aorta 2.9 cm. Inflammatory changes surrounding the distal aortic arch in the proximal descending aorta. Greatest diameter of the distal aortic arch measures approximately 3.4 cm. Greatest diameter on the comparison CT approximately 3.0 cm. No aneurysm of the descending thoracic aorta with the greatest diameter of the false lumen measuring 2.7 cm. True lumen is compressed. There is a fenestration in the distal true lumen at the level of the diaphragm just above the aortic hiatus. Pulmonary arteries: No lobar, segmental, or proximal subsegmental filling defects. Mediastinum/Nodes: No mediastinal hemorrhage. Mediastinal lymph nodes are present. Gastric tube within the esophagus. Lungs/Pleura: Ground-glass opacities developing through the the bilateral lungs. Developing interlobular septal thickening. Atelectasis of the medial segment left lower lobe. Small low-density left pleural effusion trace right-sided pleural effusion and associated atelectasis. No pneumothorax. Right upper extremity PICC appears to terminate superior vena cava. Review of the MIP images confirms the above findings. CTA ABDOMEN AND PELVIS FINDINGS VASCULAR Aorta: Re- demonstration of dissection flap of the abdominal aorta. No aneurysm.  No periaortic fluid of the abdomen. The true lumen is compressed throughout the abdomen. True lumen contributes to celiac artery origin, superior mesenteric artery origin, and  also continues across the origin of the inferior mesenteric artery. The false lumen contributes to perfusion of the right renal artery. The lateral margin of the dissection flap involves the origin of 2 left renal arteries both superior and inferior. True lumen is decompressed in the inferior aorta extending into the bilateral iliac arteries. The bilateral common iliac arteries appear perfused from the false lumen bilaterally. False lumen extends in the bilateral external iliac artery. Likely re- entry tear of distal external iliac arteries bilaterally, on the right image 173, on the left image 171. Dissection flap extends into the bilateral hypogastric arteries which are partially patent with decreased flow. Celiac: Origin of the celiac artery originates from the true lumen which is decompressed. Celiac artery remains patent at this time including the branch vessels. Typical branch pattern of splenic artery, left gastric artery, common hepatic artery. SMA: Superior mesenteric artery origin originates from the true lumen which is compressed. SMA remains patent at this time. Renals: Right renal artery originates from the false lumen. Right renal artery is perfusing at this time with uniform perfusion of the right kidney. There are 2 left renal arteries, superior and inferior. Both arteries originate near the margin of the dissection flap. The superior left renal artery is partially filling, at the in flexion point of the dissection flap, perfusing superior kidney. The inferior left renal artery appears thrombosed, new from the comparison with enlarging left renal infarction, predominantly of the lower pole. IMA: Origin of the inferior mesenteric artery is thrombosed given the collapsed true lumen at this level. There is re- constitution of the distal inferior mesenteric artery via collateral flow. Right lower extremity: False lumen perfuses the right common iliac artery with collapse of the true lumen. Dissection  flap extends into the hypogastric artery, with the distal pelvic branch is partially opacifying. External iliac artery is perfusing from the false lumen, with unremarkable appearance of the distal external iliac artery, common femoral artery, profunda femoris, SFA. There is the appearance of a re- entry tear in the mid right external iliac artery. Left  lower extremity: False lumen perfuses the left common iliac artery with collapse of the true lumen. Dissection extends into the hypogastric artery which is partially perfused with opacification of pelvic vessels. Distal external iliac artery an the common femoral artery unremarkable, with patent proximal femoral vessels. There is the appearance of a re- entry tear in the mid left external iliac artery. Veins: Unremarkable appearance of the venous system. Review of the MIP images confirms the above findings. NON-VASCULAR Hepatobiliary: Unremarkable appearance of the liver. Cholecystectomy Pancreas: Unremarkable appearance of the pancreas. No pericholecystic fluid or inflammatory changes. Unremarkable ductal system. Spleen: Unremarkable. Adrenals/Urinary Tract: Right adrenal gland unremarkable. Nodule of the left adrenal gland which measures 2.7 cm. Right: Right-sided kidney perfuses uniformly with no hydronephrosis. Left: Progressive left renal infarct with small portion of the superior kidney perfusing. The remainder of the left kidney is hypoperfused, progressed from the comparison. No hydronephrosis. Urinary catheter within the urinary bladder. Stomach/Bowel: Unremarkable appearance of stomach with gastric tube in place. Small bowel borderline dilated and fluid-filled. No transition point. The wall of the small bowel relatively uniformly thin and enhancing, although the study is timed for the arterial phase. No focal wall thickening. No abnormally distended colon. No transition point. Fluid filled colon. No focal wall thickening or pericolonic inflammatory changes.  Lymphatic: Multiple lymph nodes in the para-aortic nodal station, none of which are enlarged. Mesenteric: No free fluid or air. No adenopathy. Reproductive: Hysterectomy Other: No hernia. Musculoskeletal: No displaced fracture. Degenerative changes of the spine. 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 Only 1 possible fenestration is identified, in the distal thoracic aorta above the aortic hiatus. There is enlarging diameter of the distal aortic arch with associated inflammatory changes, measuring approximately 3.5 on today's study compared to approximately 3.1 cm previously. The appearance is concerning for progression/expansion of intramural hematoma. Progressing left renal infarction, with near complete thrombosis of superior and inferior renal arteries, both of which originate at the margin of the dissection flap. Bilateral common iliac arteries appear to be perfused from the false lumen, with complete collapse of true lumen proximally. There does appear to be re- entry to the true lumen in the mid external iliac artery bilaterally, as there is unremarkable appearance of the proximal femoral vasculature including bilateral common iliac arteries. The 3 mesenteric vessels originate from the small true lumen, which is progressively compressed. Celiac artery and superior mesenteric artery remain patent, with occlusion at the origin of the inferior mesenteric artery. Three vessel arch, with all 3 branches remain patent. No evidence of extension of the dissection flap into the branch vessels. These preliminary results were discussed by telephone at the time of interpretation on 06/27/2016 at 12:41 pm with Dr. Curt Jews. Borderline dilated small bowel without transition point or obstruction. Findings may represent ileus and/or nonspecific enteritis. No focal wall thickening to suggest Maha Fischel ischemia, although the timing of this CTA is  specific for arterial evaluation and not the bowel tissues. If ongoing concern for bowel ischemia, would repeat abd/pelvis contrast enhanced-CT portal venous phase. Similar appearance of mixed geographic ground-glass opacities of the bilateral lungs with mild interlobular septal thickening. Again, differential diagnosis includes pulmonary edema, ARDS, atypical infection. Signed, Dulcy Fanny. Earleen Newport, DO [facility] Electronically Signed   By: Corrie Mckusick D.O.   On: 06/27/2016 13:18   Ct Angio Chest/abd/pel For Dissection W And/or W/wo  Result Date: 06/20/2016 CLINICAL DATA:  Substernal chest  pain radiating to back EXAM: CT ANGIOGRAPHY CHEST, ABDOMEN AND PELVIS TECHNIQUE: Multidetector CT imaging through the chest, abdomen and pelvis was performed using the standard protocol during bolus administration of intravenous contrast. Multiplanar reconstructed images and MIPs were obtained and reviewed to evaluate the vascular anatomy. CONTRAST:  100 cc Isovue 370 IV COMPARISON:  CT abdomen and pelvis 10/27/2011 FINDINGS: CTA CHEST FINDINGS Cardiovascular: There is the type B aortic dissection beginning in the distal aortic arch just beyond the origin of the great vessels. The true lumen is compressed by the false lumen. No aneurysm. No pulmonary embolus. Heart is normal size. Mediastinum/Nodes: No mediastinal, hilar, or axillary adenopathy. Lungs/Pleura: Atelectasis or scarring in the lingula. Lungs otherwise clear. No effusions. Musculoskeletal: No acute bony abnormality. Review of the MIP images confirms the above findings. CTA ABDOMEN AND PELVIS FINDINGS VASCULAR Aorta: Dissection continues throughout the abdominal aorta with the celiac artery and superior mesenteric artery arising from the true lumen anteriorly. The dissection may extend into the left renal artery where there is only a small amount of blood flow noted in the proximal and mid left renal artery.  Right renal artery appears to arise from the false lumen and is patent. The the true lumen appears thrombosed below the origin of the superior mesenteric artery. Inferior mesenteric artery arises from the thrombosed true lumen and appears occluded proximally, reconstitutes several cm from the origin. Celiac: Patent. SMA: Patent. Renals: As above. IMA: As above. Inflow: To the dissection continues into both common iliac arteries with thrombosed true lumens. The dissection appears to terminate at approximately the level of the common iliac bifurcation bilaterally. Veins: Grossly patent and unremarkable. Review of the MIP images confirms the above findings. NON-VASCULAR Hepatobiliary: Mild diffuse fatty infiltration. Prior cholecystectomy. Pancreas: No focal abnormality or ductal dilatation. Spleen: No focal abnormality.  Normal size. Adrenals/Urinary Tract: Nodules in the left adrenal gland are low-density on the precontrast imaging compatible with small adenomas. There are areas of non perfusion noted in the mid and lower poles of the left kidney compatible with infarction, likely related to the involvement of the left renal artery by dissection. Urinary bladder unremarkable. Stomach/Bowel: Stomach, large and small bowel grossly unremarkable. Lymphatic: No adenopathy. Reproductive: Prior hysterectomy.  No adnexal masses. Other: No free fluid or free air. Musculoskeletal: No acute bony abnormality. Review of the MIP images confirms the above findings. 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 This involves the descending thoracic aorta, abdominal aorta and iliac vessels. The true lumen is compressed by the larger false lumen, and is thrombosed below the SMA/renal artery origins. The dissection appears to extend into the left renal artery with partial occlusion and areas of infarct in the mid and lower pole of the left kidney. Fatty infiltration of  the liver. Critical Value/emergent results were called by telephone at the time of interpretation on 06/20/2016 at 10:13 am to Dr. Fredia Sorrow , who verbally acknowledged these results. Electronically Signed   By: Rolm Baptise M.D.   On: 06/20/2016 10:15   Anti-infectives: Anti-infectives    Start     Dose/Rate Route Frequency Ordered Stop   07/18/16 0000  levofloxacin (LEVAQUIN) IVPB 500 mg  Status:  Discontinued     500 mg 100 mL/hr over 60 Minutes Intravenous Every 24 hours 07/17/16 0903 07/17/16 0938   07/17/16 1100  vancomycin (VANCOCIN) 1,500 mg in sodium chloride 0.9 % 500 mL IVPB     1,500  mg 250 mL/hr over 120 Minutes Intravenous Every 24 hours 07/17/16 0946 07/24/16 1059   07/17/16 1000  levofloxacin (LEVAQUIN) tablet 500 mg  Status:  Discontinued     500 mg Oral Daily 07/17/16 0851 07/17/16 0903   07/15/16 0900  ciprofloxacin (CIPRO) tablet 500 mg  Status:  Discontinued     500 mg Oral 2 times daily 07/15/16 0828 07/17/16 0851   07/14/16 1115  levofloxacin (LEVAQUIN) tablet 500 mg  Status:  Discontinued     500 mg Oral Daily 07/14/16 1107 07/14/16 1214   06/26/16 0830  meropenem (MERREM) 1 g in sodium chloride 0.9 % 100 mL IVPB  Status:  Discontinued     1 g 200 mL/hr over 30 Minutes Intravenous Every 8 hours 06/26/16 0810 07/06/16 0804   06/22/16 1800  clindamycin (CLEOCIN) IVPB 600 mg  Status:  Discontinued     600 mg 100 mL/hr over 30 Minutes Intravenous Every 8 hours 06/22/16 1723 06/26/16 0810   06/22/16 1000  azithromycin (ZITHROMAX) 250 mg in dextrose 5 % 125 mL IVPB  Status:  Discontinued     250 mg 125 mL/hr over 60 Minutes Intravenous Every 24 hours 06/22/16 0826 06/27/16 1019   06/22/16 0900  levofloxacin (LEVAQUIN) IVPB 500 mg  Status:  Discontinued     500 mg 100 mL/hr over 60 Minutes Intravenous Every 24 hours 06/22/16 0759 06/22/16 0824      Assessment/Plan: s/p * No surgery found * Receiving packed red blood cells. Reviewed CT scan from yesterday and  discussed with the patient, her husband and Dr. Hulen Skains. No real change. Continued bowel distention with no evidence of thickening or air in the intestine itself. No change in her aortic dissection with flow in the celiac and SMA through the true lumen with stenosis at the origin of both of these.   LOS: 29 days   EarlySherren Mocha 07/19/2016, 8:38 AM

## 2016-07-19 NOTE — Progress Notes (Signed)
  Subjective: Denies abdominal pain Does not feel like walking  Objective: Vital signs in last 24 hours: Temp:  [98 F (36.7 C)-99.7 F (37.6 C)] 98.7 F (37.1 C) (06/17 0827) Pulse Rate:  [97-127] 116 (06/17 0753) Cardiac Rhythm: Sinus tachycardia (06/17 0400) Resp:  [23-39] 30 (06/17 0753) BP: (106-149)/(42-66) 140/60 (06/17 0753) SpO2:  [91 %-100 %] 99 % (06/17 0840)  Hemodynamic parameters for last 24 hours:    Intake/Output from previous day: 06/16 0701 - 06/17 0700 In: 2302.8 [I.V.:2152.8; IV Piggyback:150] Out: 1050 [Urine:950; Stool:100] Intake/Output this shift: No intake/output data recorded.  General appearance: alert and mild distress Neurologic: intact Heart: regular rate and rhythm Lungs: diminished breath sounds bibasilar and rhonchi bilaterally Abdomen: distended, some tenderness unchanged, no peritoneal signs  Lab Results:  Recent Labs  07/18/16 0337 07/19/16 0420  WBC 9.5 8.1  HGB 7.7* 6.8*  HCT 22.8* 21.1*  PLT 148* 147*   BMET:  Recent Labs  07/18/16 0337 07/19/16 0420  NA 132* 132*  K 4.6 4.8  CL 111 109  CO2 16* 17*  GLUCOSE 151* 212*  BUN 26* 24*  CREATININE 1.15* 1.23*  CALCIUM 7.4* 8.3*    PT/INR:  Recent Labs  07/19/16 0630  LABPROT 20.3*  INR 1.72   ABG    Component Value Date/Time   PHART 7.430 06/28/2016 1615   HCO3 20.7 06/28/2016 1615   TCO2 22 06/28/2016 1615   ACIDBASEDEF 3.0 (H) 06/28/2016 1615   O2SAT 93.0 06/28/2016 1615   CBG (last 3)   Recent Labs  07/18/16 2331 07/19/16 0416 07/19/16 0825  GLUCAP 161* 214* 198*    Assessment/Plan: S/P  -CV- aortic dissection - unchanged on CT.   -RESP- rhonchi bilaterally, small left effusion on CXR  Receiving nebs, will give a couple of doses of solumedrol to see if that makes a difference  -RENAL- creatinine stable, metabolic acidosis persists   -GI- ileus secondary to chronically ischemic bowel persists  -ENDO- CBG modestly elevated on TNA,  continue levemir + SSI  -HEME- Hgb has been slowly drifting down over the past 10 days and was < 7 this AM. No overt bleeding, suspect GI source- transfuse 2 units this AM  Mobilize as tolerated   LOS: 29 days    Melrose Nakayama 07/19/2016

## 2016-07-19 NOTE — Progress Notes (Signed)
PHARMACY - ADULT TOTAL PARENTERAL NUTRITION CONSULT NOTE   Pharmacy Consult:  TPN Indication:  Prolonged NPO status/high NG output   Patient Measurements: Height: 5\' 3"  (160 cm) Weight: 183 lb 13.8 oz (83.4 kg) IBW/kg (Calculated) : 52.4 TPN AdjBW (KG): 59.4 Body mass index is 32.57 kg/m. 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 Patient had a significant history of N/V for 2-4 weeks PTA but weight appears to have been maintained. Patient has had several NG tube replacement due to nausea and vomiting.  Pharmacy consulted to manage TPN since 06/23/16.     GI: persistent ileus and mesenteric ischemia. NGT removed 6/11.  PPI IV, frequent Zofran usage Endo: DM on glipizide/metformin PTA - CBGs 65-71 off TPN, 121-214 on TPN Insulin requirements in the past 24 hours: 12 units SSI, Lantus 35 units BID + 45 units in TPN Lytes: low Na/CO2, others WNL (K+ trending up) Renal: SCr 1.23, BUN 24 - NS at 74ml/hr, UOP 0.5 ml/kg/hr, net +15.9L since admit Pulm: pleural effusion, RA >> 2L Parmele - Brovana, Pulmicort, PRN albuterol Cards: Stanford type B aortic dissection (stable) - BP controlled, tachy - Lopressor, clonidine 0.3 mg patch, PRN labetalol, Lasix x 1 Hepatobil: ALT/ALT down 74/140, tbili down 1.4, TG WNL, INR 1.72 - TPN is balanced. Neuro: Celexa, Klonopin, PRN Fentanyl/Dialudid/Xanax/APAP - pain score 5 ID: 3d Cipro >> Vanc D#3/7 for E.faecalis UTI.  S/p Merrem (5/25-6/4) for respiratory failure from PNA. C.diff negative.  Now afebrile, WBC WNL.  6/14 BCx NGTD Best Practices: Lovenox 30mg , CHG, MC TPN Access: PICC placed 06/21/16 TPN start date: 06/23/16  Nutritional Goals (per RD recommendation on 6/25): 2000-2200 kCal and 110-120 gm protein  Current Nutrition:   TPN Boost BID (received 1 yesterday) - each provides 9g protein + 250kcal   Plan:  - Switch back to continuous TPN:  Cinimix E 5/15 at 83 ml/hr  and continue 20% ILE at 20 ml/hr x 12 hrs. TPN provides 1894 kCal and 100gm of protein per day, meeting > 90% of total needs.  Boost to provide the rest of patient's needs.  Re-attempt cyclic TPN when respiratory status improves and Lantus dose could be reduced. - Daily multivitamin in TPN - Trace elements in TPN every other day d/t shortage, next 6/17 - Continue TCTS SSI Q4H + 45 units regular insulin in TPN + Lantus 35 units BID - Mag sulfate 1gm IV x 1 - F/U AM labs (watch K closely), ?start bicarb   Lynn Sissel D. [name], PharmD, BCPS Pager:  [phone] 07/19/2016, 7:32 AM

## 2016-07-20 ENCOUNTER — Inpatient Hospital Stay (HOSPITAL_COMMUNITY): Payer: BLUE CROSS/BLUE SHIELD

## 2016-07-20 DIAGNOSIS — K55059 Acute (reversible) ischemia of intestine, part and extent unspecified: Secondary | ICD-10-CM

## 2016-07-20 DIAGNOSIS — I7102 Dissection of abdominal aorta: Secondary | ICD-10-CM

## 2016-07-20 DIAGNOSIS — K560 Paralytic ileus: Secondary | ICD-10-CM

## 2016-07-20 LAB — TYPE AND SCREEN
ABO/RH(D): O POS
Antibody Screen: NEGATIVE
Unit division: 0
Unit division: 0
Unit division: 0

## 2016-07-20 LAB — DIFFERENTIAL
BASOS ABS: 0.1 10*3/uL (ref 0.0–0.1)
Basophils Relative: 1 %
Eosinophils Absolute: 0 10*3/uL (ref 0.0–0.7)
Eosinophils Relative: 0 %
LYMPHS PCT: 11 %
Lymphs Abs: 1.1 10*3/uL (ref 0.7–4.0)
MONOS PCT: 5 %
Monocytes Absolute: 0.5 10*3/uL (ref 0.1–1.0)
Neutro Abs: 8.7 10*3/uL — ABNORMAL HIGH (ref 1.7–7.7)
Neutrophils Relative %: 83 %

## 2016-07-20 LAB — COMPREHENSIVE METABOLIC PANEL
ALBUMIN: 2.2 g/dL — AB (ref 3.5–5.0)
ALT: 85 U/L — ABNORMAL HIGH (ref 14–54)
AST: 26 U/L (ref 15–41)
Alkaline Phosphatase: 95 U/L (ref 38–126)
Anion gap: 7 (ref 5–15)
BUN: 35 mg/dL — AB (ref 6–20)
CHLORIDE: 107 mmol/L (ref 101–111)
CO2: 19 mmol/L — AB (ref 22–32)
Calcium: 8.6 mg/dL — ABNORMAL LOW (ref 8.9–10.3)
Creatinine, Ser: 1.21 mg/dL — ABNORMAL HIGH (ref 0.44–1.00)
GFR calc Af Amer: 56 mL/min — ABNORMAL LOW (ref >60)
GFR calc non Af Amer: 48 mL/min — ABNORMAL LOW (ref >60)
GLUCOSE: 289 mg/dL — AB (ref 65–99)
POTASSIUM: 6 mmol/L — AB (ref 3.5–5.1)
SODIUM: 133 mmol/L — AB (ref 135–145)
Total Bilirubin: 1.4 mg/dL — ABNORMAL HIGH (ref 0.3–1.2)
Total Protein: 5.8 g/dL — ABNORMAL LOW (ref 6.5–8.1)

## 2016-07-20 LAB — BPAM RBC
Blood Product Expiration Date: 201807052359
Blood Product Expiration Date: 201807122359
Blood Product Expiration Date: 201807122359
ISSUE DATE / TIME: 201806142315
ISSUE DATE / TIME: 201806170650
ISSUE DATE / TIME: 201806171125
Unit Type and Rh: 5100
Unit Type and Rh: 5100
Unit Type and Rh: 5100

## 2016-07-20 LAB — GLUCOSE, CAPILLARY
Glucose-Capillary: 194 mg/dL — ABNORMAL HIGH (ref 65–99)
Glucose-Capillary: 233 mg/dL — ABNORMAL HIGH (ref 65–99)
Glucose-Capillary: 250 mg/dL — ABNORMAL HIGH (ref 65–99)
Glucose-Capillary: 251 mg/dL — ABNORMAL HIGH (ref 65–99)
Glucose-Capillary: 262 mg/dL — ABNORMAL HIGH (ref 65–99)
Glucose-Capillary: 268 mg/dL — ABNORMAL HIGH (ref 65–99)
Glucose-Capillary: 269 mg/dL — ABNORMAL HIGH (ref 65–99)
Glucose-Capillary: 270 mg/dL — ABNORMAL HIGH (ref 65–99)
Glucose-Capillary: 296 mg/dL — ABNORMAL HIGH (ref 65–99)

## 2016-07-20 LAB — BASIC METABOLIC PANEL
Anion gap: 7 (ref 5–15)
BUN: 36 mg/dL — ABNORMAL HIGH (ref 6–20)
CO2: 19 mmol/L — ABNORMAL LOW (ref 22–32)
Calcium: 8.9 mg/dL (ref 8.9–10.3)
Chloride: 109 mmol/L (ref 101–111)
Creatinine, Ser: 1.19 mg/dL — ABNORMAL HIGH (ref 0.44–1.00)
GFR calc Af Amer: 57 mL/min — ABNORMAL LOW (ref >60)
GFR calc non Af Amer: 49 mL/min — ABNORMAL LOW (ref >60)
Glucose, Bld: 261 mg/dL — ABNORMAL HIGH (ref 65–99)
Potassium: 4 mmol/L (ref 3.5–5.1)
Sodium: 135 mmol/L (ref 135–145)

## 2016-07-20 LAB — MAGNESIUM
MAGNESIUM: 2 mg/dL (ref 1.7–2.4)
Magnesium: 2 mg/dL (ref 1.7–2.4)

## 2016-07-20 LAB — CBC
HEMATOCRIT: 30.9 % — AB (ref 36.0–46.0)
HEMOGLOBIN: 10 g/dL — AB (ref 12.0–15.0)
MCH: 30.9 pg (ref 26.0–34.0)
MCHC: 32.4 g/dL (ref 30.0–36.0)
MCV: 95.4 fL (ref 78.0–100.0)
Platelets: 128 10*3/uL — ABNORMAL LOW (ref 150–400)
RBC: 3.24 MIL/uL — AB (ref 3.87–5.11)
RDW: 16.9 % — ABNORMAL HIGH (ref 11.5–15.5)
WBC: 10.4 10*3/uL (ref 4.0–10.5)

## 2016-07-20 LAB — PREALBUMIN: PREALBUMIN: 7.4 mg/dL — AB (ref 18–38)

## 2016-07-20 LAB — VANCOMYCIN, TROUGH: Vancomycin Tr: 13 ug/mL — ABNORMAL LOW (ref 15–20)

## 2016-07-20 LAB — PHOSPHORUS
PHOSPHORUS: 4.9 mg/dL — AB (ref 2.5–4.6)
Phosphorus: 4.7 mg/dL — ABNORMAL HIGH (ref 2.5–4.6)

## 2016-07-20 LAB — TRIGLYCERIDES
Triglycerides: 270 mg/dL — ABNORMAL HIGH (ref <150)
Triglycerides: 223 mg/dL — ABNORMAL HIGH (ref <150)

## 2016-07-20 MED ORDER — SODIUM BICARBONATE 8.4 % IV SOLN
50.0000 meq | Freq: Once | INTRAVENOUS | Status: AC
  Administered 2016-07-20: 50 meq via INTRAVENOUS
  Filled 2016-07-20: qty 50

## 2016-07-20 MED ORDER — FAT EMULSION 20 % IV EMUL
240.0000 mL | INTRAVENOUS | Status: AC
  Administered 2016-07-20: 240 mL via INTRAVENOUS
  Filled 2016-07-20: qty 250

## 2016-07-20 MED ORDER — M.V.I. ADULT IV INJ
INTRAVENOUS | Status: AC
  Administered 2016-07-20: 18:00:00 via INTRAVENOUS
  Filled 2016-07-20: qty 1992

## 2016-07-20 MED ORDER — INSULIN GLARGINE 100 UNIT/ML ~~LOC~~ SOLN
40.0000 [IU] | Freq: Two times a day (BID) | SUBCUTANEOUS | Status: DC
  Administered 2016-07-20 – 2016-07-23 (×8): 40 [IU] via SUBCUTANEOUS
  Filled 2016-07-20 (×10): qty 0.4

## 2016-07-20 NOTE — Plan of Care (Signed)
Problem: Activity: Goal: Risk for activity intolerance will decrease Outcome: Progressing Ambulating 2-3 daily  Problem: Bowel/Gastric: Goal: Will not experience complications related to bowel motility Outcome: Not Progressing Patient has BLOOD/CLOTS in STOOL 2x

## 2016-07-20 NOTE — Progress Notes (Signed)
[facility]/Trauma Progress Note      Subjective:  CC: abdominal bloating  Pt's husband at bedside. He states the pt had 5 BM's 2 nights ago and one this AM. He states she has been up walking. No nausea or vomiting. Tolerating jello and ice chips. Pt denies abdominal pain but has bloating. Complaining of hip/pelivic pain which pt states is chronic.   Objective: Vital signs in last 24 hours: Temp:  [96.9 F (36.1 C)-99.6 F (37.6 C)] 97.9 F (36.6 C) (06/18 0700) Pulse Rate:  [86-117] 108 (06/18 0700) Resp:  [16-32] 32 (06/18 0700) BP: (111-150)/(46-79) 116/68 (06/18 0600) SpO2:  [91 %-98 %] 91 % (06/18 0700) Last BM Date: 07/19/16  Intake/Output from previous day: 06/17 0701 - 06/18 0700 In: 3324.1 [P.O.:270; I.V.:2236.1; Blood:818] Out: 1300 [Urine:1300] Intake/Output this shift: No intake/output data recorded.  PE: Gen:  Alert, NAD, pleasant, cooperative Card:  RRR Pulm:  Rate and effort normal, [place] in place Abd: Soft, moderately distended, few normal BS with tinkling BS, mild generalized TTP Skin: no rashes noted, warm and dry  Lab Results:   Recent Labs  07/19/16 0420 07/20/16 0430  WBC 8.1 10.4  HGB 6.8* 10.0*  HCT 21.1* 30.9*  PLT 147* 128*   BMET  Recent Labs  07/20/16 0430 07/20/16 0759  NA 133* 135  K 6.0* 4.0  CL 107 109  CO2 19* 19*  GLUCOSE 289* 261*  BUN 35* 36*  CREATININE 1.21* 1.19*  CALCIUM 8.6* 8.9   PT/INR  Recent Labs  07/19/16 0630  LABPROT 20.3*  INR 1.72   CMP     Component Value Date/Time   NA 135 07/20/2016 0759   K 4.0 07/20/2016 0759   CL 109 07/20/2016 0759   CO2 19 (L) 07/20/2016 0759   GLUCOSE 261 (H) 07/20/2016 0759   BUN 36 (H) 07/20/2016 0759   CREATININE 1.19 (H) 07/20/2016 0759   CALCIUM 8.9 07/20/2016 0759   PROT 5.8 (L) 07/20/2016 0430   ALBUMIN 2.2 (L) 07/20/2016 0430   AST 26 07/20/2016 0430   ALT 85 (H) 07/20/2016 0430   ALKPHOS 95 07/20/2016 0430   BILITOT 1.4 (H) 07/20/2016  0430   GFRNONAA 49 (L) 07/20/2016 0759   GFRAA 57 (L) 07/20/2016 0759   Lipase     Component Value Date/Time   LIPASE 38 06/20/2016 0815    Studies/Results: Ct Chest W Contrast  Result Date: 07/18/2016 CLINICAL DATA:  Pt c/o abdominal pain for several days with nausea ^122mL ISOVUE-300 IOPAMIDOL (ISOVUE-300) INJECTION 61%; Pt c/o abdominal pain for several days Pt c/o abdominal pain for several days with nausea ^156mL ISOVUE-300 IOPAMIDOL (ISOVUE-300) INJECTION 61% EXAM: CT CHEST, ABDOMEN, AND PELVIS WITH CONTRAST TECHNIQUE: Multidetector CT imaging of the chest, abdomen and pelvis was performed following the standard protocol during bolus administration of intravenous contrast. 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 Flap appears stable, beginning just distal to the left subclavian artery. 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 Heart size is normal. Mitral calcifications are present. No pericardial effusion. Great vessels are atherosclerotic but not aneurysmal. The dissection flap does not extend into the great vessels. A right central line extends into the superior vena cava. Pulmonary arteries have a normal appearance given the timing of the contrast bolus. Mediastinum/Nodes: The visualized portion of  the thyroid gland has a normal appearance. No mediastinal, hilar, or axillary adenopathy. Esophagus is normal in appearance. Lungs/Pleura: Increased left pleural effusion. There is new left lower lobe consolidation associated with air bronchograms. Focal areas of atelectasis or infiltrate are identified within the lingula and right lower lobe. No suspicious pulmonary nodules. Musculoskeletal: No chest wall mass or suspicious bone lesions identified. CT ABDOMEN PELVIS FINDINGS  Hepatobiliary: Status post cholecystectomy. Liver is normal in appearance. Pancreas: Unremarkable. No pancreatic ductal dilatation or surrounding inflammatory changes. Spleen: Normal in size without focal abnormality. Adrenals/Urinary Tract: Stable appearance of numerous left adrenal nodules, largest measuring 2.7 cm. The right adrenal gland is normal in appearance. Normal enhancement and excretion from the right kidney. Majority of the left kidney appears infarcted with no excretion in demonstrate on the delayed images. Stomach/Bowel: The stomach has a normal appearance. There is dilatation of small bowel loops common beginning at the proximal jejunum. Loops are fluid-filled without significant wall thickening. There is similar dilatation and fluid-filled appearance of large bowel loops. Findings most consistent with ileus. No transition zone or mass identified. Vascular/Lymphatic: Type B dissection of the aortic continues into the iliac vessels. There is occlusion of the true lumen distal to this superior mesenteric artery. The right renal artery perfuses from the false lumen and opacifies with contrast. The dual left renal arteries at their origin near the dissection flap and are less well seen compared the prior study. Superior mesenteric artery and celiac axis are well opacified. Contrast is identified within the inferior mesenteric artery, likely from retrograde filling. Reproductive: Status post hysterectomy.  No adnexal mass. Other: No abdominal wall hernia or abnormality. No abdominopelvic ascites. Musculoskeletal: No acute or significant osseous findings. IMPRESSION: 1. Stable appearance of type B dissection. 2. Aortic aneurysm NOS (ICD10-I71.9). Distal aortic arch is stable at 3.5 cm. Recommend annual imaging followup by CTA or MRA. This recommendation follows 2010 ACCF/AHA/AATS/ACR/ASA/SCA/SCAI/SIR/STS/SVM Guidelines for the Diagnosis and Management of Patients with Thoracic Aortic Disease.  Circulation.2010; 121: B341-P379 3. Interval development of left pleural effusion and left lower lobe consolidation. 4. Infarcted left kidney. 5. Dilated fluid filled large and small bowel consistent with ileus. No evidence for obstruction or mass. 6. Stable appearance of left adrenal nodules, consistent with benign process. 7. Status post hysterectomy. Electronically Signed   By: Nolon Nations M.D.   On: 07/18/2016 16:55   Ct Abdomen Pelvis W Contrast  Result Date: 07/18/2016 CLINICAL DATA:  Pt c/o abdominal pain for several days with nausea ^130mL ISOVUE-300 IOPAMIDOL (ISOVUE-300) INJECTION 61%; Pt c/o abdominal pain for several days Pt c/o abdominal pain for several days with nausea ^123mL ISOVUE-300 IOPAMIDOL (ISOVUE-300) INJECTION 61% EXAM: CT CHEST, ABDOMEN, AND PELVIS WITH CONTRAST TECHNIQUE: Multidetector CT imaging of the chest, abdomen and pelvis was performed following the standard protocol during bolus administration of intravenous contrast. 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 Flap appears stable, beginning just distal to the left subclavian artery. 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 Heart size is normal. Mitral calcifications are present. No pericardial effusion. Great vessels are atherosclerotic but not aneurysmal. The dissection flap does not extend into the great vessels. A right central line extends into the superior vena cava. Pulmonary arteries have a normal appearance given the timing of the contrast bolus. Mediastinum/Nodes: The visualized portion of the  thyroid gland has a normal appearance. No mediastinal, hilar, or axillary adenopathy. Esophagus is normal in appearance. Lungs/Pleura: Increased left pleural effusion. There is new left lower lobe consolidation  associated with air bronchograms. Focal areas of atelectasis or infiltrate are identified within the lingula and right lower lobe. No suspicious pulmonary nodules. Musculoskeletal: No chest wall mass or suspicious bone lesions identified. CT ABDOMEN PELVIS FINDINGS Hepatobiliary: Status post cholecystectomy. Liver is normal in appearance. Pancreas: Unremarkable. No pancreatic ductal dilatation or surrounding inflammatory changes. Spleen: Normal in size without focal abnormality. Adrenals/Urinary Tract: Stable appearance of numerous left adrenal nodules, largest measuring 2.7 cm. The right adrenal gland is normal in appearance. Normal enhancement and excretion from the right kidney. Majority of the left kidney appears infarcted with no excretion in demonstrate on the delayed images. Stomach/Bowel: The stomach has a normal appearance. There is dilatation of small bowel loops common beginning at the proximal jejunum. Loops are fluid-filled without significant wall thickening. There is similar dilatation and fluid-filled appearance of large bowel loops. Findings most consistent with ileus. No transition zone or mass identified. Vascular/Lymphatic: Type B dissection of the aortic continues into the iliac vessels. There is occlusion of the true lumen distal to this superior mesenteric artery. The right renal artery perfuses from the false lumen and opacifies with contrast. The dual left renal arteries at their origin near the dissection flap and are less well seen compared the prior study. Superior mesenteric artery and celiac axis are well opacified. Contrast is identified within the inferior mesenteric artery, likely from retrograde filling. Reproductive: Status post hysterectomy.  No adnexal mass. Other: No abdominal wall hernia or abnormality. No abdominopelvic ascites. Musculoskeletal: No acute or significant osseous findings. IMPRESSION: 1. Stable appearance of type B dissection. 2. Aortic aneurysm NOS  (ICD10-I71.9). Distal aortic arch is stable at 3.5 cm. Recommend annual imaging followup by CTA or MRA. This recommendation follows 2010 ACCF/AHA/AATS/ACR/ASA/SCA/SCAI/SIR/STS/SVM Guidelines for the Diagnosis and Management of Patients with Thoracic Aortic Disease. Circulation.2010; 121: M196-Q229 3. Interval development of left pleural effusion and left lower lobe consolidation. 4. Infarcted left kidney. 5. Dilated fluid filled large and small bowel consistent with ileus. No evidence for obstruction or mass. 6. Stable appearance of left adrenal nodules, consistent with benign process. 7. Status post hysterectomy. Electronically Signed   By: Nolon Nations M.D.   On: 07/18/2016 16:55   Dg Chest Port 1 View  Result Date: 07/20/2016 CLINICAL DATA:  Atelectasis.  Shortness of breath. EXAM: PORTABLE CHEST 1 VIEW COMPARISON:  CT 07/18/2016.  Chest x-ray 07/13/2016. FINDINGS: Interim removal of NG tube. Right PICC line in stable position. Heart size stable. Persistent basilar atelectasis and infiltrates. Small left pleural effusion. No pneumothorax . IMPRESSION: 1. Interim removal of NG tube.  Right PICC line stable position. 2. Persistent bibasilar atelectasis and infiltrates. Small left pleural effusion . Electronically Signed   By: Marcello Moores  Register   On: 07/20/2016 07:35   Dg Abd Portable 1v  Result Date: 07/19/2016 CLINICAL DATA:  Ileus EXAM: PORTABLE ABDOMEN - 1 VIEW COMPARISON:  CT of the abdomen and pelvis on 07/18/2016 FINDINGS: There is moderate persistent dilatation of both large and small bowel loops consistent with ileus. No evidence for free intraperitoneal air. IMPRESSION: Unchanged appearance of ileus pattern. Electronically Signed   By: Nolon Nations M.D.   On: 07/19/2016 08:28    Anti-infectives: Anti-infectives    Start     Dose/Rate Route Frequency Ordered Stop   07/18/16 0000  levofloxacin (LEVAQUIN) IVPB 500 mg  Status:  Discontinued     500 mg 100 mL/hr over 60 Minutes  Intravenous Every 24 hours 07/17/16 0903 07/17/16 0938   07/17/16 1100  vancomycin (VANCOCIN) 1,500 mg in sodium chloride 0.9 % 500 mL IVPB     1,500 mg 250 mL/hr over 120 Minutes Intravenous Every 24 hours 07/17/16 0946 07/24/16 1059   07/17/16 1000  levofloxacin (LEVAQUIN) tablet 500 mg  Status:  Discontinued     500 mg Oral Daily 07/17/16 0851 07/17/16 0903   07/15/16 0900  ciprofloxacin (CIPRO) tablet 500 mg  Status:  Discontinued     500 mg Oral 2 times daily 07/15/16 0828 07/17/16 0851   07/14/16 1115  levofloxacin (LEVAQUIN) tablet 500 mg  Status:  Discontinued     500 mg Oral Daily 07/14/16 1107 07/14/16 1214   06/26/16 0830  meropenem (MERREM) 1 g in sodium chloride 0.9 % 100 mL IVPB  Status:  Discontinued     1 g 200 mL/hr over 30 Minutes Intravenous Every 8 hours 06/26/16 0810 07/06/16 0804   06/22/16 1800  clindamycin (CLEOCIN) IVPB 600 mg  Status:  Discontinued     600 mg 100 mL/hr over 30 Minutes Intravenous Every 8 hours 06/22/16 1723 06/26/16 0810   06/22/16 1000  azithromycin (ZITHROMAX) 250 mg in dextrose 5 % 125 mL IVPB  Status:  Discontinued     250 mg 125 mL/hr over 60 Minutes Intravenous Every 24 hours 06/22/16 0826 06/27/16 1019   06/22/16 0900  levofloxacin (LEVAQUIN) IVPB 500 mg  Status:  Discontinued     500 mg 100 mL/hr over 60 Minutes Intravenous Every 24 hours 06/22/16 0759 06/22/16 0824       Assessment/Plan  Diabetes Aortic dissection Left renal infarct Intestinal angina Ileus - pt has had BM's, tolerating some PO - abd films 6/17 showed persistent dilatation of small and large bowel consistent with ileus   FEN: continue TNA, sips as tolerated VTE: lovenox ID: Vancomycin  DISPO: no plans for surgery at this time, CT on 6/16 showed no evidence of bowel thickening or air in the intestinal wall. Continue TNA and allow sips as tolerated    LOS: 30 days    Kalman Drape , [facility] 07/20/2016, 8:48 AM Pager:  [phone] Consults: [phone] Mon-Fri 7:00 am-4:30 pm Sat-Sun 7:00 am-11:30 am

## 2016-07-20 NOTE — Consult Note (Signed)
[facility] Consult: 11:42 AM 07/20/2016  LOS: 30 days    Referring Provider: Dr Prescott Gum  Primary Care Physician:  Glenda Chroman, MD Primary Gastroenterologist:  Dr. Hildred Laser    Reason for Consultation:  Ileus,mesenteric ischemia   HPI: Haley Graham is a 61 y.o. female.  PMH HTN.  Smoker.  Arthritis.  Anxiety.  Type 2 DM, oral agents.  IBS (Bentyl prn).  Gastroparesis. Colon polyps. S/p chole and hysterectomy.     11/2011 EGD for solid dysphagia: small sliding HH.  Empiric dilation of esophagus, though no stricture/ring observed 12/2011 GES.  Delayed gastric emptying, 39% retention at 2 hours.  06/10/2016 Colonoscopy for change in bowel habits.  Polyps at cecum (small), transverse (small) and hepatic flixure (38mm).  Sigmoid tics.  Hypertrophic anal papilla.  All polyps were tubular adenomas.  Plan for 2023 recall. 06/02/16 abd ultrasound.  Fatty liver, s/p cholecystectomy.   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  Initial angiogram: type B dissection, from distal arch to just beyond origin of great vessels.  Involving the descending thoracic aorta, abdominal aorta and iliac vessels.   Extending into the left renal artery with partial occlusion and areas of infarct in the    mid and lower pole of the left kidney.  Patent Celiac, SMA, IMA. Angiogram #2 on 5/26: enlarging diameter distal aortic arch, concerning for progression of intramural hematoma.  3 mesenteric vessels originate from the small true lumen, which is progressively compressed. Celiac artery and superior mesenteric artery remain patent, with occlusion at the origin of the inferior mesenteric artery.  Bilateral common iliac arteries appear to be perfused from the false lumen, with complete collapse of true  lumen proximally.  Borderline dilated small bowel without transition point or obstruction. Findings may represent ileus and/or nonspecific enteritis 07/18/16 CT ab/pelvis w/ contrast.  stabel aortic aneurysm.  Left pleural effusion and consolidation.  Infarcted left kidney.  Dilated  SB and colon c/w ileus.  Stable adrenal nodules.    Has persistent ileus.  General surgery, Dr Hulen Skains, following.  Per his 6/17 note: "patient clinically looks worse to me, but does not require a laparotomy at this time".  She is passing stool and tolerating limited clears.   Protein malnutrition.  Improving AKI.  Enterococcal UTI.  Some ? As to pancreatitis, Lipase normal on 5/19, Amylase elevated to 461 on 5/20.  However no pancreatitis per CT scans.       Hgb in last few days drifting down, no overt bleeding but MD suspects GI bleeding.  From baselin 9 to 11, to 8.6 >> 7.7 >> 6.8 >> 10 (following latest PRBCs.  Was macrocytic, but this has corrected with the transfusions.    Platelets 120s to 150s in last 9 days, previously normal.  Some minor rise of LFTs, resolving.     RN reports significant depression.  They have to push her to take assisted walks (cleaned house, cooked, drove etc PTA).  No appetite, only takes limited small amounts of clear liquids.  2 to 4 loose stools per day (C diff negative 07/03/16).  Previous abd pain has mostly resolved.  Intermittent nausea, no recent vomiting.  She is tearful at thinking about her mom who died recently.  All her anxiety and psych meds (Celexa, Xanax tid stopped at admission but restarted 10 days ago)   .       Past Medical History:  Diagnosis Date  . Anxiety   . Arthritis   . Diabetes mellitus without complication ([place])   . Gastroparesis   . GERD (gastroesophageal reflux disease)   . Hypertension   . IBS (irritable bowel syndrome)     Past Surgical History:  Procedure Laterality Date  . ABDOMINAL HYSTERECTOMY    . CESAREAN SECTION    . CHOLECYSTECTOMY    .  COLONOSCOPY    . COLONOSCOPY N/A 06/10/2016   Procedure: COLONOSCOPY;  Surgeon: Rogene Houston, MD;  Location: [facility];  Service: Endoscopy;  Laterality: N/A;  8:30  . POLYPECTOMY  06/10/2016   Procedure: POLYPECTOMY;  Surgeon: Rogene Houston, MD;  Location: [facility];  Service: Endoscopy;;  multiple colon  . UPPER GASTROINTESTINAL ENDOSCOPY      Prior to Admission medications   Medication Sig Start Date End Date Taking? Authorizing Provider  albuterol (PROVENTIL) (2.5 MG/3ML) 0.083% nebulizer solution Take 2.5 mg by nebulization every 6 (six) hours as needed for wheezing or shortness of breath.   Yes [provider]  albuterol (VENTOLIN HFA) 108 (90 Base) MCG/ACT inhaler Inhale 2 puffs into the lungs every 6 (six) hours as needed for wheezing or shortness of breath.    Yes [provider]  ALPRAZolam (XANAX) 0.5 MG tablet Take 0.25-0.5 mg by mouth 2 (two) times daily.    Yes [provider]  aspirin EC 81 MG tablet Take 324 mg by mouth once.    Yes [provider]  citalopram (CELEXA) 40 MG tablet Take 40 mg by mouth at bedtime.    Yes [provider]  dicyclomine (BENTYL) 10 MG capsule Take 1 capsule (10 mg total) by mouth 2 (two) times daily before a meal. Patient taking differently: Take 10 mg by mouth 2 (two) times daily as needed (IBS symptoms).  06/10/16  Yes Rehman, Mechele Dawley, MD  fluticasone (FLONASE) 50 MCG/ACT nasal spray Place 2 sprays into both nostrils daily as needed for allergies or rhinitis.  05/17/16  Yes [provider]  glipiZIDE-metformin (METAGLIP) 5-500 MG tablet Take 0.5 tablets by mouth 2 (two) times daily before a meal.   Yes [provider]  metoprolol tartrate (LOPRESSOR) 25 MG tablet Take 25 mg by mouth daily.   Yes [provider]  ondansetron (ZOFRAN) 4 MG tablet Take 4 mg by mouth 3 (three) times daily as needed for nausea. 05/22/16  Yes [provider]  pantoprazole (PROTONIX)  40 MG tablet Take 40 mg by mouth daily as needed (acid reflux/ indigestion).  11/12/11  Yes Rehman, Mechele Dawley, MD  Probiotic Product (PROBIOTIC PO) Take 1 tablet by mouth at bedtime. Gut Restore Ultimate (probiotic with zinc and vitamin C)   Yes [provider]  rosuvastatin (CRESTOR) 10 MG tablet Take 10 mg by mouth at bedtime.    Yes [provider]    Scheduled Meds: . arformoterol  15 mcg Nebulization BID  . budesonide (PULMICORT) nebulizer solution  0.5 mg Nebulization BID  . chlorhexidine  15 mL Mouth Rinse BID  . Chlorhexidine Gluconate Cloth  6 each Topical Q0600  .  citalopram  40 mg Oral Daily  . clonazePAM  1 mg Oral QHS  . cloNIDine  0.3 mg Transdermal Weekly  . enoxaparin (LOVENOX) injection  30 mg Subcutaneous Q24H  . feeding supplement  1 Container Oral BID BM  . insulin aspart  0-24 Units Subcutaneous Q4H  . insulin glargine  40 Units Subcutaneous BID  . magic mouthwash  5 mL Oral QID  . mouth rinse  15 mL Mouth Rinse q12n4p  . metoprolol tartrate  25 mg Oral BID  . pantoprazole (PROTONIX) IV  40 mg Intravenous Q24H  . sodium chloride flush  10-40 mL Intracatheter Q12H   Infusions: . sodium chloride    . sodium chloride 10 mL/hr at 07/20/16 0700  . sodium chloride    . sodium chloride    . Marland KitchenTPN (CLINIMIX-E) Adult     And  . fat emulsion    . Marland KitchenTPN (CLINIMIX-E) Adult Stopped (07/20/16 0865)  . vancomycin 1,500 mg (07/20/16 1109)   PRN Meds: Place/Maintain arterial line **AND** sodium chloride, acetaminophen **OR** acetaminophen, albuterol, ALPRAZolam, fentaNYL (SUBLIMAZE) injection, Gerhardt's butt cream, HYDROmorphone, hydrOXYzine, labetalol, naproxen, ondansetron (ZOFRAN) IV, phenol, pneumococcal 23 valent vaccine, promethazine, simethicone, sodium chloride flush   Allergies as of 06/20/2016 - Review Complete 06/20/2016  Allergen Reaction Noted  . Codeine Nausea And Vomiting 11/11/2011  . Reglan [metoclopramide] Other (See Comments) 11/30/2011    . Penicillins Rash 10/27/2011    No family history on file.  Social History   Social History  . Marital status: Married    Spouse name: N/A  . Number of children: N/A  . Years of education: N/A   Occupational History  . Not on file.   Social History Main Topics  . Smoking status: Current Every Day Smoker    Packs/day: 0.75    Years: 23.00    Types: Cigarettes  . Smokeless tobacco: Never Used     Comment: 5-6 cigarettes. Cut back about 3 weeks ago. Smoking since age 73.  Marland Kitchen Alcohol use No  . Drug use: No  . Sexual activity: Not on file   Other Topics Concern  . Not on file   Social History Narrative  . No narrative on file    REVIEW OF SYSTEMS: Constitutional:  weakness ENT:  No nose bleeds Pulm:  No SOB or cough CV:  No palpitations, no LE edema.  GU:  No hematuria, no frequency GI:  Per HPI Heme:  No unusual bleeding or bruising   Transfusions:  Per HPI.   Neuro:  No headaches, no peripheral tingling or numbness Derm:  No itching, no rash or sores.  Endocrine:  No sweats or chills.  No polyuria or dysuria Immunization:  Not queried Travel:  None beyond local counties in last few months.    PHYSICAL EXAM: Vital signs in last 24 hours: Vitals:   07/20/16 0700 07/20/16 1130  BP:    Pulse: (!) 108   Resp: (!) 32   Temp: 97.9 F (36.6 C) 97.7 F (36.5 C)   Wt Readings from Last 3 Encounters:  07/18/16 83.4 kg (183 lb 13.8 oz)  06/10/16 82.1 kg (181 lb)  05/27/16 84.6 kg (186 lb 6.4 oz)    General: obese WF who looks unwell,  Head:  Facial rubor in checks and between eyebrows  Eyes:  No icterus or pallor Ears:  Slightly HOH  Nose:  No discharge.  Verlot oxygen line but no NGT in place Mouth:  Moist oral MM.  Tongue midline Neck:  No mass, no JVD Lungs:  Clear bil.   Heart: RRR.  No mrg.  S1, S2 present Abdomen:  Soft, NT, ND.  No mass, no HSM.  No bruits.  BS hypoactive, no tinkling or tympanitic BS.   Rectal: not done   Musc/Skeltl: no joint  swelling or redness Extremities:  No CCE  Neurologic:  Oriented x 3.   Skin: facial rubor Tattoos:  none   Psych:  Anxious, depressed.     Intake/Output from previous day: 06/17 0701 - 06/18 0700 In: 3324.1 [P.O.:270; I.V.:2236.1; Blood:818] Out: 1300 [Urine:1300] Intake/Output this shift: Total I/O In: 10 [I.V.:10] Out: -   LAB RESULTS:  Recent Labs  07/18/16 0337 07/19/16 0420 07/20/16 0430  WBC 9.5 8.1 10.4  HGB 7.7* 6.8* 10.0*  HCT 22.8* 21.1* 30.9*  PLT 148* 147* 128*   BMET Lab Results  Component Value Date   NA 135 07/20/2016   NA 133 (L) 07/20/2016   NA 132 (L) 07/19/2016   K 4.0 07/20/2016   K 6.0 (H) 07/20/2016   K 4.8 07/19/2016   CL 109 07/20/2016   CL 107 07/20/2016   CL 109 07/19/2016   CO2 19 (L) 07/20/2016   CO2 19 (L) 07/20/2016   CO2 17 (L) 07/19/2016   GLUCOSE 261 (H) 07/20/2016   GLUCOSE 289 (H) 07/20/2016   GLUCOSE 212 (H) 07/19/2016   BUN 36 (H) 07/20/2016   BUN 35 (H) 07/20/2016   BUN 24 (H) 07/19/2016   CREATININE 1.19 (H) 07/20/2016   CREATININE 1.21 (H) 07/20/2016   CREATININE 1.23 (H) 07/19/2016   CALCIUM 8.9 07/20/2016   CALCIUM 8.6 (L) 07/20/2016   CALCIUM 8.3 (L) 07/19/2016   LFT  Recent Labs  07/18/16 0337 07/20/16 0430  PROT 5.0* 5.8*  ALBUMIN 1.6* 2.2*  AST 74* 26  ALT 140* 85*  ALKPHOS 97 95  BILITOT 1.4* 1.4*   PT/INR Lab Results  Component Value Date   INR 1.72 07/19/2016   Lipase     Component Value Date/Time   LIPASE 38 06/20/2016 0815    Drugs of Abuse  No results found for: LABOPIA, COCAINSCRNUR, LABBENZ, AMPHETMU, THCU, LABBARB   RADIOLOGY STUDIES: Ct Chest W Contrast Ct Abdomen Pelvis W Contrast  Result Date: 07/18/2016 CLINICAL DATA:  Pt c/o abdominal pain for several days with nausea ^157mL ISOVUE-300 IOPAMIDOL (ISOVUE-300) INJECTION 61%; Pt c/o abdominal pain for several days Pt c/o abdominal pain for several days with nausea ^184mL ISOVUE-300 IOPAMIDOL (ISOVUE-300) INJECTION 61% EXAM:  CT CHEST, ABDOMEN, AND PELVIS WITH CONTRAST TECHNIQUE: Multidetector CT imaging of the chest, abdomen and pelvis was performed following the standard protocol during bolus administration of intravenous contrast. 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 Flap appears stable, beginning just distal to the left subclavian artery. 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 Heart size is normal. Mitral calcifications are present. No pericardial effusion. Great vessels are atherosclerotic but not aneurysmal. The dissection flap does not extend into the great vessels. A right central line extends into the superior vena cava. Pulmonary arteries have a normal appearance given the timing of the contrast bolus. Mediastinum/Nodes: The visualized portion of the thyroid gland has a normal appearance. No mediastinal, hilar, or axillary adenopathy. Esophagus is normal in appearance. Lungs/Pleura: Increased left pleural effusion. There is new left lower  lobe consolidation associated with air bronchograms. Focal areas of atelectasis or infiltrate are identified within the lingula and right lower lobe. No suspicious pulmonary nodules. Musculoskeletal: No chest wall mass or suspicious bone lesions identified. CT ABDOMEN PELVIS FINDINGS Hepatobiliary: Status post cholecystectomy. Liver is normal in appearance. Pancreas: Unremarkable. No pancreatic ductal dilatation or surrounding inflammatory changes. Spleen: Normal in size without focal abnormality. Adrenals/Urinary Tract: Stable appearance of numerous left adrenal nodules, largest measuring 2.7 cm. The right adrenal gland is normal in appearance. Normal enhancement and excretion from the right kidney. Majority of the left kidney appears infarcted with no excretion in  demonstrate on the delayed images. Stomach/Bowel: The stomach has a normal appearance. There is dilatation of small bowel loops common beginning at the proximal jejunum. Loops are fluid-filled without significant wall thickening. There is similar dilatation and fluid-filled appearance of large bowel loops. Findings most consistent with ileus. No transition zone or mass identified. Vascular/Lymphatic: Type B dissection of the aortic continues into the iliac vessels. There is occlusion of the true lumen distal to this superior mesenteric artery. The right renal artery perfuses from the false lumen and opacifies with contrast. The dual left renal arteries at their origin near the dissection flap and are less well seen compared the prior study. Superior mesenteric artery and celiac axis are well opacified. Contrast is identified within the inferior mesenteric artery, likely from retrograde filling. Reproductive: Status post hysterectomy.  No adnexal mass. Other: No abdominal wall hernia or abnormality. No abdominopelvic ascites. Musculoskeletal: No acute or significant osseous findings. IMPRESSION: 1. Stable appearance of type B dissection. 2. Aortic aneurysm NOS (ICD10-I71.9). Distal aortic arch is stable at 3.5 cm. Recommend annual imaging followup by CTA or MRA. This recommendation follows 2010 ACCF/AHA/AATS/ACR/ASA/SCA/SCAI/SIR/STS/SVM Guidelines for the Diagnosis and Management of Patients with Thoracic Aortic Disease. Circulation.2010; 121: Z610-R604 3. Interval development of left pleural effusion and left lower lobe consolidation. 4. Infarcted left kidney. 5. Dilated fluid filled large and small bowel consistent with ileus. No evidence for obstruction or mass. 6. Stable appearance of left adrenal nodules, consistent with benign process. 7. Status post hysterectomy. Electronically Signed   By: Nolon Nations M.D.   On: 07/18/2016 16:55   Dg Chest Port 1 View  Result Date: 07/20/2016 CLINICAL DATA:   Atelectasis.  Shortness of breath. EXAM: PORTABLE CHEST 1 VIEW COMPARISON:  CT 07/18/2016.  Chest x-ray 07/13/2016. FINDINGS: Interim removal of NG tube. Right PICC line in stable position. Heart size stable. Persistent basilar atelectasis and infiltrates. Small left pleural effusion. No pneumothorax . IMPRESSION: 1. Interim removal of NG tube.  Right PICC line stable position. 2. Persistent bibasilar atelectasis and infiltrates. Small left pleural effusion . Electronically Signed   By: Marcello Moores  Register   On: 07/20/2016 07:35   Dg Abd Portable 1v  Result Date: 07/19/2016 CLINICAL DATA:  Ileus EXAM: PORTABLE ABDOMEN - 1 VIEW COMPARISON:  CT of the abdomen and pelvis on 07/18/2016 FINDINGS: There is moderate persistent dilatation of both large and small bowel loops consistent with ileus. No evidence for free intraperitoneal air. IMPRESSION: Unchanged appearance of ileus pattern. Electronically Signed   By: Nolon Nations M.D.   On: 07/19/2016 08:28     IMPRESSION:   *  Aortic dissection, medical mgt.   involves occluded IMA and likely ischemic bowel.  SB and colonic ileus per CT.    *  Anemia.  S/p PRBC x 3.  Was Macrocytic.    *  Protein cal malnutrition.  TNA in place.    *  Depression anxiety.  Worse in setting of prolonged acute illness and recent death of her mother.   This is certainly impacting her appetite    PLAN:     *  ? Flex sig to evaluate health/viablity of colon, will not tolerate oral bowel prep.    *  B12, folate and iron studies in AM.     Azucena Freed  07/20/2016, 11:42 AM Pager: [phone]     Attending physician's note   I have taken a history, examined the patient and reviewed the chart. I agree with the Advanced Practitioner's note, impression and recommendations. Aortic dissection with ileus and suspected ischemic bowel. Continue NPO, IV fluids, TNA, supportive mgmt.    Lucio Edward, MD [facility] [phone] Mon-Fri 8a-5p [phone] after 5p, weekends, holidays

## 2016-07-20 NOTE — Progress Notes (Signed)
Patient ID: Haley Graham, female   DOB: 1955/12/20, 61 y.o.   MRN: [id number] Looks more comfortable this afternoon. Had slightly formed bowel movement today. Evaluation by GI noted. Abdomen soft and nontender. Discussed with patient and her husband present. Very slow progress. Discussed with Dr.Van Trigt.  We'll discuss with surgeons at several surrounding academic centers to see if other options are possible. Patient is making slow progress. Encouraged her to continue mobility is much as possible

## 2016-07-20 NOTE — Progress Notes (Signed)
Pharmacy Antibiotic Note  Haley Graham is a 61 y.o. female with enterococcal UTI and noted with a PCN allergy.  Pharmacy consulted to dose vancomycin. Avoiding flouroquinolones due to aortic dissection and Macrobid due to SCr trend up. Plans to treat for 7 days  Afebrile today, WBC WNL.  Day # 4 of vancomycin  Vancomycin trough level drawn an hour early = 13.  Should be adequate to treat for UTI.  Plan: -Continue Vancomycin 1500mg  IV Q24h (planning total duration of 7 days) -Will follow renal function, cultures and clinical progress -Vancomycin level at steady state  Height: 5\' 3"  (160 cm) Weight: 183 lb 13.8 oz (83.4 kg) IBW/kg (Calculated) : 52.4  Temp (24hrs), Avg:98.2 F (36.8 C), Min:96.9 F (36.1 C), Max:99.6 F (37.6 C)   Recent Labs Lab 07/16/16 1428 07/17/16 0458 07/17/16 0630 07/18/16 0337 07/19/16 0420 07/20/16 0430 07/20/16 0759 07/20/16 0957  WBC 8.5 9.2  --  9.5 8.1 10.4  --   --   CREATININE  --   --  1.21* 1.15* 1.23* 1.21* 1.19*  --   LATICACIDVEN 1.7  --   --   --   --   --   --   --   VANCOTROUGH  --   --   --   --   --   --   --  13*    Estimated Creatinine Clearance: 52.1 mL/min (A) (by C-G formula based on SCr of 1.19 mg/dL (H)).    Allergies  Allergen Reactions  . Ativan [Lorazepam] Other (See Comments)    Makes pt very confused and more agitated, irritable.  . Codeine Nausea And Vomiting  . Reglan [Metoclopramide] Other (See Comments)    Heart races  . Penicillins Rash    Has patient had a PCN reaction causing immediate rash, facial/tongue/throat swelling, SOB or lightheadedness with hypotension: yes  Has patient had a PCN reaction causing severe rash involving mucus membranes or skin necrosis: no Has patient had a PCN reaction that required hospitalization no Has patient had a PCN reaction occurring within the last 10 years:no If all of the above answers are "NO", then may proceed with Cephalosporin use.    Antimicrobials this  admission:  6/15 vanc>> (6/21) 6/13 cipro>>6/15  Dose adjustments this admission:  6/18 VT = 13 (drawn ~ 1 hr early) on vanc 1500 q 24 - con't same (10-15 should be fine for UTI?)  Microbiology results:  5/19 MRSA screen neg 5/21 Ucx neg 5/21 Bcx neg 5/25 BCx > neg 5/27 BCx x 2> neg 6/1 Cdif: neg 6/9 urine: enterococcus (> 100K). Pan-S 6/13 urine: enterococcus (> 100K). Pan-S 6/14 BCx x 2: ngtd  Thank you for allowing pharmacy to be a part of this patient's care.  Uvaldo Rising, BCPS  Clinical Pharmacist Pager [phone]  07/20/2016 10:57 AM

## 2016-07-20 NOTE — Progress Notes (Signed)
Physical Therapy Treatment Patient Details Name: Haley Graham MRN: [id number] DOB: 1955-06-17 Today's Date: 07/20/2016    History of Present Illness Pt adm with type B aortic dissection. Treated non surgically with BP control and pain control. Pt developed pancreatitis and ileus with NG tube placed. PMH - anxiety, arthritis    PT Comments    Pt admitted with above diagnosis. Pt currently with functional limitations due to the deficits listed below (see PT Problem List). Pt was able to ambulate with RW with min guard assist on unit needing max encouragement to continue at times as she becomes anxious.  Needs to continue to ambulate as able.  Pt will benefit from skilled PT to increase their independence and safety with mobility to allow discharge to the venue listed below.     Follow Up Recommendations  Home health PT;Supervision/Assistance - 24 hour     Equipment Recommendations  Rolling walker with 5" wheels    Recommendations for Other Services       Precautions / Restrictions Precautions Precautions: Fall Restrictions Weight Bearing Restrictions: No    Mobility  Bed Mobility Overal bed mobility: Needs Assistance Bed Mobility: Supine to Sit;Sit to Supine Rolling: Supervision Sidelying to sit: Min assist Supine to sit: HOB elevated;Min guard Sit to supine: Min guard Sit to sidelying: Min assist General bed mobility comments: increased time/effort; cues for sequencing  Transfers Overall transfer level: Needs assistance Equipment used: Rolling walker (2 wheeled) Transfers: Sit to/from [facility] Sit to Stand: Min guard         General transfer comment: No assist, incr time to come to stand.   Ambulation/Gait Ambulation/Gait assistance: Min guard;+2 safety/equipment (chair follow) Ambulation Distance (Feet): 350 Feet (250 feet, sitting rest break and then 100 feet) Assistive device: Rolling walker (2 wheeled) Gait Pattern/deviations:  Step-through pattern;Decreased stride length Gait velocity: decr Gait velocity interpretation: Below normal speed for age/gender General Gait Details: pt less guarded without use of AD today; cues for breathing technique throughout with pt demonstrating more carry over; VSS with mobility and 2L O2 via [place]; one seated rest break   Stairs            Wheelchair Mobility    Modified Rankin (Stroke Patients Only)       Balance Overall balance assessment: Needs assistance Sitting-balance support: No upper extremity supported;Feet supported Sitting balance-Leahy Scale: Good     Standing balance support: Bilateral upper extremity supported;During functional activity Standing balance-Leahy Scale: Poor Standing balance comment: walker and min guard assist for static standing                            Cognition Arousal/Alertness: Awake/alert Behavior During Therapy: Anxious Overall Cognitive Status: Impaired/Different from baseline Area of Impairment: Following commands;Attention;Problem solving                   Current Attention Level: Sustained   Following Commands: Follows one step commands with increased time Safety/Judgement: Decreased awareness of safety Awareness: Emergent Problem Solving: Decreased initiation;Requires verbal cues;Slow processing General Comments: pt following commands more consistently this session and a little more communicative      Exercises Total Joint Exercises Long Arc Quad: AROM;Both;Seated;10 reps General Exercises - Lower Extremity Ankle Circles/Pumps: AROM;10 reps;Both;Supine    General Comments General comments (skin integrity, edema, etc.): HR 90-100 bpm, 95% on 3LO2, 128/66, 22.  O2 89% occasionally but improved with pursed lip breathing with pt on 2LO2.  Pertinent Vitals/Pain Pain Assessment: Faces Faces Pain Scale: Hurts little more Pain Location: pelvis with mobility Pain Descriptors / Indicators:  Grimacing;Aching Pain Intervention(s): Limited activity within patient's tolerance;Monitored during session;Premedicated before session;Repositioned    Home Living                      Prior Function            PT Goals (current goals can now be found in the care plan section) Progress towards PT goals: Progressing toward goals    Frequency    Min 3X/week      PT Plan Current plan remains appropriate    Co-evaluation              AM-PAC PT "6 Clicks" Daily Activity  Outcome Measure  Difficulty turning over in bed (including adjusting bedclothes, sheets and blankets)?: A Little Difficulty moving from lying on back to sitting on the side of the bed? : A Lot Difficulty sitting down on and standing up from a chair with arms (e.g., wheelchair, bedside commode, etc,.)?: A Little Help needed moving to and from a bed to chair (including a wheelchair)?: A Little Help needed walking in hospital room?: A Little Help needed climbing 3-5 steps with a railing? : A Lot 6 Click Score: 16    End of Session Equipment Utilized During Treatment: Gait belt;Oxygen Activity Tolerance: Patient limited by fatigue;Patient limited by pain Patient left: in bed;with call bell/phone within reach;Other (comment) (bed in chair position--declined sitting in chair) Nurse Communication: Mobility status PT Visit Diagnosis: Muscle weakness (generalized) (M62.81);Other abnormalities of gait and mobility (R26.89)     Time: 1030-1101 PT Time Calculation (min) (ACUTE ONLY): 31 min  Charges:  $Gait Training: 23-37 mins                    G Codes:       Haley Graham,PT Acute Rehabilitation [phone] (pager)    [name] 07/20/2016, 4:24 PM

## 2016-07-20 NOTE — Progress Notes (Addendum)
Nutrition Follow-up  DOCUMENTATION CODES:   Obesity unspecified  INTERVENTION:   Continue:  Boost Breeze po BID, each supplement provides 250 kcal and 9 grams of protein  NUTRITION DIAGNOSIS:   Inadequate oral intake related to altered GI function as evidenced by NPO status. Ongoing.   GOAL:   Patient will meet greater than or equal to 90% of their needs Met.   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 Pt admitted for pain control, blood pressure control, bed rest and IV hydration.   Per MD signs and symptoms of sub-acute mesenteric ischemia continue, possible eval at [facility] CBG's: 250-262-268 TG: 223  TPN: Clinimix E 5/15 @ 83 ml/hr, 20% ILE @ 20 ml/hr x 12 hours Provides: 1894 kcal (95% kcal needs) and 100 grams protein (91% of needs)  Diet Order:  Diet NPO time specified TPN (CLINIMIX-E) Adult TPN (CLINIMIX-E) Adult  Skin:  Reviewed, no issues  Last BM:  6/18  Height:   Ht Readings from Last 1 Encounters:  07/06/16 5' 3" (1.6 m)    Weight:   Wt Readings from Last 1 Encounters:  07/18/16 183 lb 13.8 oz (83.4 kg)    Ideal Body Weight:  52.2 kg  BMI:  Body mass index is 32.57 kg/m.  Estimated Nutritional Needs:   Kcal:  2000-2200  Protein:  110-120 gm  Fluid:  per MD  EDUCATION NEEDS:   No education needs identified at this time  [place], [place], [place] Pager [phone] After Hours Pager

## 2016-07-20 NOTE — Plan of Care (Signed)
Problem: Physical Regulation: Goal: Ability to maintain clinical measurements within normal limits will improve Outcome: Progressing Given 2prbc; HR down in the 90s

## 2016-07-20 NOTE — Progress Notes (Signed)
PHARMACY - ADULT TOTAL PARENTERAL NUTRITION CONSULT NOTE   Pharmacy Consult:  TPN Indication:  Prolonged NPO status/high NG output   Patient Measurements: Height: 5\' 3"  (160 cm) Weight: 183 lb 13.8 oz (83.4 kg) IBW/kg (Calculated) : 52.4 TPN AdjBW (KG): 59.4 Body mass index is 32.57 kg/m. 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 Patient had a significant history of N/V for 2-4 weeks PTA but weight appears to have been maintained. Patient has had several NG tube replacement due to nausea and vomiting.  Pharmacy consulted to manage TPN since 06/23/16.    K+ 6 this morning on CMET.  Asked RN to hold TPN and repeat labs ~0700.  Repeat K+ = 4, resume TPN ~0900.   GI: persistent ileus and mesenteric ischemia. Prealbumin 10.4 >> 7.4, BM x1, bloated.  PPI IV, frequent Zofran usage Endo: DM on glipizide/metformin PTA - CBGs elevated (SM 40mg  x 2 doses given 6/17) Insulin requirements in the past 24 hours: 36 units SSI, Lantus 35 units BID + 45 units in TPN Lytes: CO2 low (MD aware Clinimix cannot fix), Phos elevated at 4.7 (Ca x Phos = 49, goal < 55), others WNL Renal: repeat labs - SCr 1.18, BUN 36 - NS at 69ml/hr, UOP 0.6 ml/kg/hr, net +17.6L since admit Pulm: pleural effusion, 4L [place] - Brovana, Pulmicort, PRN albuterol Cards: Stanford type B aortic dissection (stable) - BP controlled, tachy resolving - Lopressor, clonidine 0.3 mg patch, PRN labetalol, Lasix x 1 Hepatobil: ALT/ALT improving, tbili down 1.4, INR 1.72.  TG elevated at 223. Neuro: Celexa, Klonopin, PRN Fentanyl/Dialudid/Xanax/APAP - pain score 4 ID: 3d Cipro >> Vanc D#4/7 for E.faecalis UTI.  S/p Merrem (5/25-6/4) for respiratory failure from PNA. C.diff negative.  Now afebrile, WBC WNL.  6/14 BCx NGTD Best Practices: Lovenox 30mg , CHG, MC TPN Access: PICC placed 06/21/16 TPN start date: 06/23/16  Nutritional Goals (per RD recommendation on  6/25): 2000-2200 kCal and 110-120 gm protein  New goal if TG allows: Clinimix E 5/15 at 100 ml/hr + 20% ILE at 20 ml/hr x 12 hrs (= 2184 kCal and 120gm protein per day)  Current Nutrition:   TPN Boost BID (received 0 yesterday) - each provides 9g protein + 250kcal   Plan:  - Continue Cinimix E 5/15 at 83 ml/hr and 20% ILE at 20 ml/hr x 12 hrs. TPN provides 1894 kCal and 100gm of protein per day, meeting > 90% of total needs.  Increase Clinimix to 100 ml/hr once CBGs are controlled.  Re-attempt cyclic TPN when respiratory status improves and ?Lantus dose could be reduced. - Daily multivitamin in TPN - Trace elements in TPN every other day d/t shortage, next 6/19 - Continue TCTS SSI Q4H + 45 units regular insulin in TPN.  Lantus increased to 40 units BID per MD. - Repeat TG on Thurs - F/U CBGs, AM labs (may need to remove lytes from TPN tomorrow)   Remigio Eisenmenger D. [name], PharmD, BCPS Pager:  [phone] 07/20/2016, 9:27 AM

## 2016-07-20 NOTE — Progress Notes (Signed)
  Subjective: Signs and symptoms of sub-acute mesenteric ischemia persist Gastroenterology opinion appreciated Discussed possible Vascular surgery evaluation at [facility] center with [facility] Receiving vanco for recurrent enterococcus UTI Objective: Vital signs in last 24 hours: Temp:  [96.9 F (36.1 C)-97.9 F (36.6 C)] 97.7 F (36.5 C) (06/18 1130) Pulse Rate:  [86-108] 96 (06/18 1600) Cardiac Rhythm: Normal sinus rhythm (06/18 1300) Resp:  [16-32] 28 (06/18 1600) BP: (111-150)/(46-72) 128/54 (06/18 1600) SpO2:  [91 %-100 %] 96 % (06/18 1600)  Hemodynamic parameters for last 24 hours:   stable Intake/Output from previous day: 06/17 0701 - 06/18 0700 In: 3324.1 [P.O.:270; I.V.:2236.1; Blood:818] Out: 1300 [Urine:1300] Intake/Output this shift: Total I/O In: 1252 [P.O.:480; I.V.:772] Out: -   abd distended but non tender Decreased BS L base Lab Results:  Recent Labs  07/19/16 0420 07/20/16 0430  WBC 8.1 10.4  HGB 6.8* 10.0*  HCT 21.1* 30.9*  PLT 147* 128*   BMET:  Recent Labs  07/20/16 0430 07/20/16 0759  NA 133* 135  K 6.0* 4.0  CL 107 109  CO2 19* 19*  GLUCOSE 289* 261*  BUN 35* 36*  CREATININE 1.21* 1.19*  CALCIUM 8.6* 8.9    PT/INR:  Recent Labs  07/19/16 0630  LABPROT 20.3*  INR 1.72   ABG    Component Value Date/Time   PHART 7.430 06/28/2016 1615   HCO3 20.7 06/28/2016 1615   TCO2 22 06/28/2016 1615   ACIDBASEDEF 3.0 (H) 06/28/2016 1615   O2SAT 93.0 06/28/2016 1615   CBG (last 3)   Recent Labs  07/20/16 0606 07/20/16 0755 07/20/16 1118  GLUCAP 270* 250* 262*    Assessment/Plan: S/P type B dissection Recent anemia req transfusion- stop prophylactic lovenox, naprosyn. Hb now back to 10  Cont TPN    LOS: 30 days    [name] 07/20/2016

## 2016-07-21 DIAGNOSIS — K559 Vascular disorder of intestine, unspecified: Secondary | ICD-10-CM

## 2016-07-21 LAB — BASIC METABOLIC PANEL
Anion gap: 5 (ref 5–15)
Anion gap: 7 (ref 5–15)
BUN: 38 mg/dL — ABNORMAL HIGH (ref 6–20)
BUN: 37 mg/dL — ABNORMAL HIGH (ref 6–20)
CO2: 20 mmol/L — ABNORMAL LOW (ref 22–32)
CO2: 16 mmol/L — ABNORMAL LOW (ref 22–32)
Calcium: 8.5 mg/dL — ABNORMAL LOW (ref 8.9–10.3)
Calcium: 8.7 mg/dL — ABNORMAL LOW (ref 8.9–10.3)
Chloride: 109 mmol/L (ref 101–111)
Chloride: 110 mmol/L (ref 101–111)
Creatinine, Ser: 0.99 mg/dL (ref 0.44–1.00)
Creatinine, Ser: 0.98 mg/dL (ref 0.44–1.00)
GFR calc Af Amer: >60 mL/min (ref >60)
GFR calc Af Amer: >60 mL/min (ref >60)
GFR calc non Af Amer: >60 mL/min (ref >60)
GFR calc non Af Amer: >60 mL/min (ref >60)
Glucose, Bld: 188 mg/dL — ABNORMAL HIGH (ref 65–99)
Glucose, Bld: 179 mg/dL — ABNORMAL HIGH (ref 65–99)
Potassium: 5.6 mmol/L — ABNORMAL HIGH (ref 3.5–5.1)
Potassium: 4.4 mmol/L (ref 3.5–5.1)
Sodium: 134 mmol/L — ABNORMAL LOW (ref 135–145)
Sodium: 133 mmol/L — ABNORMAL LOW (ref 135–145)

## 2016-07-21 LAB — GLUCOSE, CAPILLARY
Glucose-Capillary: 168 mg/dL — ABNORMAL HIGH (ref 65–99)
Glucose-Capillary: 184 mg/dL — ABNORMAL HIGH (ref 65–99)
Glucose-Capillary: 185 mg/dL — ABNORMAL HIGH (ref 65–99)
Glucose-Capillary: 187 mg/dL — ABNORMAL HIGH (ref 65–99)
Glucose-Capillary: 187 mg/dL — ABNORMAL HIGH (ref 65–99)
Glucose-Capillary: 204 mg/dL — ABNORMAL HIGH (ref 65–99)
Glucose-Capillary: >600 mg/dL (ref 65–99)

## 2016-07-21 LAB — PHOSPHORUS
Phosphorus: 3.5 mg/dL (ref 2.5–4.6)
Phosphorus: 3.7 mg/dL (ref 2.5–4.6)

## 2016-07-21 LAB — RETICULOCYTES
RBC.: 3.29 MIL/uL — ABNORMAL LOW (ref 3.87–5.11)
Retic Count, Absolute: 55.9 10*3/uL (ref 19.0–186.0)
Retic Ct Pct: 1.7 % (ref 0.4–3.1)

## 2016-07-21 LAB — FERRITIN: Ferritin: 1454 ng/mL — ABNORMAL HIGH (ref 11–307)

## 2016-07-21 LAB — CULTURE, BLOOD (ROUTINE X 2)
Culture: NO GROWTH
Culture: NO GROWTH
Special Requests: ADEQUATE
Special Requests: ADEQUATE

## 2016-07-21 LAB — CBC
HCT: 30.8 % — ABNORMAL LOW (ref 36.0–46.0)
Hemoglobin: 10 g/dL — ABNORMAL LOW (ref 12.0–15.0)
MCH: 30.4 pg (ref 26.0–34.0)
MCHC: 32.5 g/dL (ref 30.0–36.0)
MCV: 93.6 fL (ref 78.0–100.0)
Platelets: 90 10*3/uL — ABNORMAL LOW (ref 150–400)
RBC: 3.29 MIL/uL — ABNORMAL LOW (ref 3.87–5.11)
RDW: 16.4 % — ABNORMAL HIGH (ref 11.5–15.5)
WBC: 9 10*3/uL (ref 4.0–10.5)

## 2016-07-21 LAB — IRON AND TIBC
IRON: 25 ug/dL — AB (ref 28–170)
Saturation Ratios: 18 % (ref 10.4–31.8)
TIBC: 140 ug/dL — AB (ref 250–450)
UIBC: 115 ug/dL

## 2016-07-21 LAB — FOLATE: FOLATE: 15.9 ng/mL (ref >5.9)

## 2016-07-21 LAB — VITAMIN B12: Vitamin B-12: 2159 pg/mL — ABNORMAL HIGH (ref 180–914)

## 2016-07-21 MED ORDER — TRACE MINERALS CR-CU-MN-SE-ZN 10-1000-500-60 MCG/ML IV SOLN
INTRAVENOUS | Status: DC
  Administered 2016-07-21: 18:00:00 via INTRAVENOUS
  Filled 2016-07-21: qty 1992

## 2016-07-21 MED ORDER — PHYTONADIONE 5 MG PO TABS
2.5000 mg | ORAL_TABLET | Freq: Once | ORAL | Status: AC
  Administered 2016-07-21: 2.5 mg via ORAL
  Filled 2016-07-21: qty 1

## 2016-07-21 MED ORDER — METOPROLOL TARTRATE 50 MG PO TABS
50.0000 mg | ORAL_TABLET | Freq: Two times a day (BID) | ORAL | Status: DC
  Administered 2016-07-21 – 2016-07-24 (×6): 50 mg via ORAL
  Filled 2016-07-21 (×8): qty 1

## 2016-07-21 MED ORDER — FUROSEMIDE 10 MG/ML IJ SOLN
40.0000 mg | Freq: Once | INTRAMUSCULAR | Status: AC
  Administered 2016-07-21: 40 mg via INTRAVENOUS
  Filled 2016-07-21: qty 4

## 2016-07-21 MED ORDER — FUROSEMIDE 10 MG/ML IJ SOLN
40.0000 mg | Freq: Every day | INTRAMUSCULAR | Status: DC
  Administered 2016-07-22 – 2016-07-25 (×4): 40 mg via INTRAVENOUS
  Filled 2016-07-21 (×4): qty 4

## 2016-07-21 MED ORDER — FAT EMULSION 20 % IV EMUL
240.0000 mL | INTRAVENOUS | Status: AC
  Administered 2016-07-21: 240 mL via INTRAVENOUS
  Filled 2016-07-21: qty 250

## 2016-07-21 NOTE — Progress Notes (Signed)
Daily Rounding Note  07/21/2016, 8:40 AM  LOS: 31 days   SUBJECTIVE:   Chief complaint: anxious.    Streaks of blood in stool overnight.  No significant abd pain.  No nausea.  Passing gas and stools are mostly liquid to soft but some formed stool yesterday.  Walking in hall yesterday.    OBJECTIVE:         Vital signs in last 24 hours:    Temp:  [97.7 F (36.5 C)-98.8 F (37.1 C)] 98.4 F (36.9 C) (06/19 0700) Pulse Rate:  [87-102] 100 (06/19 0700) Resp:  [16-29] 19 (06/19 0700) BP: (116-152)/(54-77) 142/62 (06/19 0700) SpO2:  [95 %-100 %] 97 % (06/19 0700) Weight:  [88 kg (194 lb 0.1 oz)] 88 kg (194 lb 0.1 oz) (06/19 0500) Last BM Date: 07/20/16 Filed Weights   07/17/16 0500 07/18/16 0613 07/21/16 0500  Weight: 82.1 kg (181 lb) 83.4 kg (183 lb 13.8 oz) 88 kg (194 lb 0.1 oz)   General:   Anxious.  Not toxic or unstable Heart: RRR Chest: clear bil.   Abdomen: soft, slightly distended.  BS active   Extremities: no CCE Neuro/Psych:  Oriented x 3.  Anxious.  Moves all 4 limbs.    Intake/Output from previous day: 06/18 0701 - 06/19 0700 In: 2604.8 [P.O.:480; I.V.:2124.8] Out: 350 [Urine:350]  Intake/Output this shift: No intake/output data recorded.  Lab Results:  Recent Labs  07/19/16 0420 07/20/16 0430 07/21/16 0400  WBC 8.1 10.4 9.0  HGB 6.8* 10.0* 10.0*  HCT 21.1* 30.9* 30.8*  PLT 147* 128* 90*   BMET  Recent Labs  07/20/16 0430 07/20/16 0759 07/21/16 0400  NA 133* 135 134*  K 6.0* 4.0 5.6*  CL 107 109 109  CO2 19* 19* 20*  GLUCOSE 289* 261* 188*  BUN 35* 36* 38*  CREATININE 1.21* 1.19* 0.99  CALCIUM 8.6* 8.9 8.5*   LFT  Recent Labs  07/20/16 0430  PROT 5.8*  ALBUMIN 2.2*  AST 26  ALT 85*  ALKPHOS 95  BILITOT 1.4*   PT/INR  Recent Labs  07/19/16 0630  LABPROT 20.3*  INR 1.72   Hepatitis Panel No results for input(s): HEPBSAG, HCVAB, HEPAIGM, HEPBIGM in the last 72  hours.  Studies/Results: Dg Chest Port 1 View  Result Date: 07/20/2016 CLINICAL DATA:  Atelectasis.  Shortness of breath. EXAM: PORTABLE CHEST 1 VIEW COMPARISON:  CT 07/18/2016.  Chest x-ray 07/13/2016. FINDINGS: Interim removal of NG tube. Right PICC line in stable position. Heart size stable. Persistent basilar atelectasis and infiltrates. Small left pleural effusion. No pneumothorax . IMPRESSION: 1. Interim removal of NG tube.  Right PICC line stable position. 2. Persistent bibasilar atelectasis and infiltrates. Small left pleural effusion . Electronically Signed   By: Marcello Moores  Register   On: 07/20/2016 07:35    ASSESMENT:   *  Ischemic bowel after aortic dissection with occluded IMA.  Colonic, SB ileus per latest imaging.   Streaks of blood in stool overnight.    *  PCM.  TNA in place.    *  Anemia.  Initially macrocytic.  S/p PRBC x 3.  Hgb stable.    Low iron and TIBC.    *  Hyperkalemia.    *  Thrombocytopenia.     PLAN   *  Per Dr Fuller Plan  *  Trial clears.     Azucena Freed  07/21/2016, 8:40 AM Pager: [phone]     Attending physician's note  I have taken an interval history, reviewed the chart and examined the patient. I agree with the Advanced Practitioner's note, impression and recommendations. Ischemic bowel with ileus after aortic dissection. Continue clears as tolerated. Anemia improved following transfusion. Iron replacement when GI tract has recovered.   Lucio Edward, MD Marval Regal [phone] Mon-Fri 8a-5p [phone] after 5p, weekends, holidays

## 2016-07-21 NOTE — Progress Notes (Signed)
PHARMACY - ADULT TOTAL PARENTERAL NUTRITION CONSULT NOTE   Pharmacy Consult:  TPN Indication:  Prolonged NPO status/high NG output   Patient Measurements: Height: 5\' 3"  (160 cm) Weight: 194 lb 0.1 oz (88 kg) IBW/kg (Calculated) : 52.4 TPN AdjBW (KG): 59.4 Body mass index is 34.37 kg/m. 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 Patient had a significant history of N/V for 2-4 weeks PTA but weight appears to have been maintained. Patient has had several NG tube replacement due to nausea and vomiting.  Pharmacy consulted to manage TPN since 06/23/16.    K+ 5.6 this AM and sample is hemolyzed.  STAT repeat BMET.   GI: persistent ileus and mesenteric ischemia. Prealbumin 10.4 >> 7.4, BM x5.  PPI IV.  Angiogram with possible TEVAR on Friday PM Endo: DM on glipizide/metformin PTA - CBGs improving, significant SSI use (SM 40mg  x 2 doses given 6/17) Insulin requirements in the past 24 hours: 76 units SSI, Lantus 40 units BID + 45 units in TPN Lytes: repeat labs - all WNL except mild hyponatremia and low CO2 Renal: SCr 0.98, BUN 37 - NS at 50ml/hr, UOP 0.2 ml/kg/hr, net +19.4L since admit Pulm: pleural effusion, down to 3L [place] - Brovana, Pulmicort, PRN albuterol Cards: Stanford type B aortic dissection (stable) - BP controlled, tachy resolving - Lopressor, clonidine 0.3 mg patch, PRN labetalol Hepatobil: ALT/ALT improving, tbili down 1.4, INR 1.72.  TG elevated at 223. Neuro: Celexa, Klonopin, PRN Fentanyl/Dialudid/Xanax/APAP - pain score 0-2 ID: 3d Cipro >> Vanc D#5/7 for E.faecalis UTI.  S/p Merrem (5/25-6/4) for PNA. C.diff negative.  Now afebrile, WBC WNL.  6/14 BCx NGTD Best Practices: SCDs, CHG, MC TPN Access: PICC placed 06/21/16  TPN start date: 06/23/16  Nutritional Goals (per RD recommendation on 6/18): 2000-2200 kCal and 110-120 gm protein  New goal if TG allows: Clinimix E 5/15 at 100 ml/hr + 20% ILE  at 20 ml/hr x 12 hrs (= 2184 kCal and 120gm protein per day)  Current Nutrition:   TPN Clear liquid diet Boost BID (received 1 yesterday) - each provides 9g protein + 250kcal   Plan:  - Continue Cinimix E 5/15 at 83 ml/hr and 20% ILE at 20 ml/hr x 12 hrs. TPN provides 1894 kCal and 100gm of protein per day, meeting > 90% of total needs.  Increase Clinimix to 100 ml/hr once CBGs are controlled. - Daily multivitamin in TPN - Trace elements in TPN every other day d/t shortage, next 6/19 - Continue TCTS SSI Q4H + Lantus 40 units BID.  Increase insulin in TPN to 60 units. - Repeat TG on Thurs - Monitor CO2 (Clinimix unable to provide additional bicarb, CVTS aware) - F/U CBGs   Haley Graham, PharmD, BCPS Pager:  [phone] 07/21/2016, 1:24 PM

## 2016-07-21 NOTE — Progress Notes (Signed)
Subjective/Chief Complaint: Shortness of breath  Patient more awake and alert this morning Had several bowel movements yesterday Tolerating clear liquids/ jello without any change in abdominal symptoms No significant abdominal pain   Objective: Vital signs in last 24 hours: Temp:  [97.7 F (36.5 C)-98.8 F (37.1 C)] 98.4 F (36.9 C) (06/19 0700) Pulse Rate:  [87-102] 100 (06/19 0700) Resp:  [16-29] 19 (06/19 0700) BP: (116-152)/(54-77) 142/62 (06/19 0700) SpO2:  [95 %-100 %] 97 % (06/19 0700) Weight:  [88 kg (194 lb 0.1 oz)] 88 kg (194 lb 0.1 oz) (06/19 0500) Last BM Date: 07/20/16  Intake/Output from previous day: 06/18 0701 - 06/19 0700 In: 2604.8 [P.O.:480; I.V.:2124.8] Out: 350 [Urine:350] Intake/Output this shift: No intake/output data recorded.  Gen:  Alert, NAD, pleasant, cooperative Card:  RRR Pulm:  B rhonchi; mildly increased work of breathing Abd: Soft, moderately distended, hypoactive bowel sounds, mild generalized TTP Skin: no rashes noted, warm and dry Lab Results:   Recent Labs  07/20/16 0430 07/21/16 0400  WBC 10.4 9.0  HGB 10.0* 10.0*  HCT 30.9* 30.8*  PLT 128* PENDING   BMET  Recent Labs  07/20/16 0759 07/21/16 0400  NA 135 134*  K 4.0 5.6*  CL 109 109  CO2 19* 20*  GLUCOSE 261* 188*  BUN 36* 38*  CREATININE 1.19* 0.99  CALCIUM 8.9 8.5*   PT/INR  Recent Labs  07/19/16 0630  LABPROT 20.3*  INR 1.72   ABG No results for input(s): PHART, HCO3 in the last 72 hours.  Invalid input(s): PCO2, PO2  Studies/Results: Dg Chest Port 1 View  Result Date: 07/20/2016 CLINICAL DATA:  Atelectasis.  Shortness of breath. EXAM: PORTABLE CHEST 1 VIEW COMPARISON:  CT 07/18/2016.  Chest x-ray 07/13/2016. FINDINGS: Interim removal of NG tube. Right PICC line in stable position. Heart size stable. Persistent basilar atelectasis and infiltrates. Small left pleural effusion. No pneumothorax . IMPRESSION: 1. Interim removal of NG tube.  Right  PICC line stable position. 2. Persistent bibasilar atelectasis and infiltrates. Small left pleural effusion . Electronically Signed   By: Marcello Moores  Register   On: 07/20/2016 07:35    Anti-infectives: Anti-infectives    Start     Dose/Rate Route Frequency Ordered Stop   07/18/16 0000  levofloxacin (LEVAQUIN) IVPB 500 mg  Status:  Discontinued     500 mg 100 mL/hr over 60 Minutes Intravenous Every 24 hours 07/17/16 0903 07/17/16 0938   07/17/16 1100  vancomycin (VANCOCIN) 1,500 mg in sodium chloride 0.9 % 500 mL IVPB     1,500 mg 250 mL/hr over 120 Minutes Intravenous Every 24 hours 07/17/16 0946 07/24/16 1059   07/17/16 1000  levofloxacin (LEVAQUIN) tablet 500 mg  Status:  Discontinued     500 mg Oral Daily 07/17/16 0851 07/17/16 0903   07/15/16 0900  ciprofloxacin (CIPRO) tablet 500 mg  Status:  Discontinued     500 mg Oral 2 times daily 07/15/16 0828 07/17/16 0851   07/14/16 1115  levofloxacin (LEVAQUIN) tablet 500 mg  Status:  Discontinued     500 mg Oral Daily 07/14/16 1107 07/14/16 1214   06/26/16 0830  meropenem (MERREM) 1 g in sodium chloride 0.9 % 100 mL IVPB  Status:  Discontinued     1 g 200 mL/hr over 30 Minutes Intravenous Every 8 hours 06/26/16 0810 07/06/16 0804   06/22/16 1800  clindamycin (CLEOCIN) IVPB 600 mg  Status:  Discontinued     600 mg 100 mL/hr over 30 Minutes Intravenous Every 8  hours 06/22/16 1723 06/26/16 0810   06/22/16 1000  azithromycin (ZITHROMAX) 250 mg in dextrose 5 % 125 mL IVPB  Status:  Discontinued     250 mg 125 mL/hr over 60 Minutes Intravenous Every 24 hours 06/22/16 0826 06/27/16 1019   06/22/16 0900  levofloxacin (LEVAQUIN) IVPB 500 mg  Status:  Discontinued     500 mg 100 mL/hr over 60 Minutes Intravenous Every 24 hours 06/22/16 0759 06/22/16 2241      Assessment/Plan: Diabetes Aortic dissection Left renal infarct Intestinal angina Ileus - pt has had BM's, tolerating some PO - abd films 6/17 showed persistent dilatation of small and  large bowel consistent with ileus   FEN: continue TNA, clear liquids as tolerated VTE: lovenox ID: Vancomycin  DISPO: no plans for surgery at this time, CT on 6/16 showed no evidence of bowel thickening or air in the intestinal wall. Continue TNA and allow clears as tolerated   LOS: 31 days    Haley Poli K. 07/21/2016

## 2016-07-21 NOTE — Progress Notes (Signed)
Procedure(s) (LRB): ARCH ANGIOGRAM (N/A) POSSIBLE THORACIC AORTIC ENDOVASCULAR STENT GRAFT (N/A) Subjective: Wt up 3 lbs, I/O positive 3 L yesterday- will resume daily lasix to keep I/O balanced with TPN. Creat stable Increase metoprolol to 50 bid for hypertension  Walked in hall , + BM  Objective: Vital signs in last 24 hours: Temp:  [97.8 F (36.6 C)-98.8 F (37.1 C)] 98 F (36.7 C) (06/19 1100) Pulse Rate:  [92-102] 102 (06/19 1200) Cardiac Rhythm: Normal sinus rhythm;Sinus tachycardia (06/19 0800) Resp:  [18-34] 22 (06/19 1200) BP: (121-152)/(54-77) 141/69 (06/19 1200) SpO2:  [95 %-100 %] 97 % (06/19 1200) Weight:  [194 lb 0.1 oz (88 kg)] 194 lb 0.1 oz (88 kg) (06/19 0500)  Hemodynamic parameters for last 24 hours:  nsr  Intake/Output from previous day: 06/18 0701 - 06/19 0700 In: 2790.8 [P.O.:480; I.V.:2310.8] Out: 350 [Urine:350] Intake/Output this shift: Total I/O In: 465 [I.V.:465] Out: -        Exam    General- alert and comfortable   Lungs- clear without rales, wheezes   Cor- regular rate and rhythm, no murmur , gallop   Abdomen- soft, distended,  non-tender   Extremities - warm, non-tender, minimal edema   Neuro- oriented, appropriate, no focal weakness   Lab Results:  Recent Labs  07/20/16 0430 07/21/16 0400  WBC 10.4 9.0  HGB 10.0* 10.0*  HCT 30.9* 30.8*  PLT 128* 90*   BMET:  Recent Labs  07/21/16 0400 07/21/16 1009  NA 134* 133*  K 5.6* 4.4  CL 109 110  CO2 20* 16*  GLUCOSE 188* 179*  BUN 38* 37*  CREATININE 0.99 0.98  CALCIUM 8.5* 8.7*    PT/INR:  Recent Labs  07/19/16 0630  LABPROT 20.3*  INR 1.72   ABG    Component Value Date/Time   PHART 7.430 06/28/2016 1615   HCO3 20.7 06/28/2016 1615   TCO2 22 06/28/2016 1615   ACIDBASEDEF 3.0 (H) 06/28/2016 1615   O2SAT 93.0 06/28/2016 1615   CBG (last 3)   Recent Labs  07/21/16 0353 07/21/16 0809 07/21/16 1155  GLUCAP 184* 187* 185*    Assessment/Plan: S/P  Procedure(s) (LRB): ARCH ANGIOGRAM (N/A) POSSIBLE THORACIC AORTIC ENDOVASCULAR STENT GRAFT (N/A) Mobilize Diuresis Diabetes control Plan for discharge: see discharge orders   LOS: 31 days    [name] 07/21/2016

## 2016-07-21 NOTE — Progress Notes (Signed)
   Discussed type b dissection with husband and daughter Cailin. At this time it seems prudent to proceed with intervention given her lack of progression with medical management. I have offered to proceed with arch angiogram and possible TEVAR and/or discuss management with a tertiary referral center. At this time they wish to proceed here. We specifically risks of stroke, mi, paraplegia, renal failure, death and need for bypass operation to either her celiac, sma, renal or femoral arteries. They demonstrate good understanding of her dire situation. Will plan for Friday afternoon.   Korry Dalgleish C. Donzetta Matters, MD [facility] Office: [phone] Pager: [phone]

## 2016-07-22 DIAGNOSIS — K567 Ileus, unspecified: Secondary | ICD-10-CM

## 2016-07-22 DIAGNOSIS — I7103 Dissection of thoracoabdominal aorta: Secondary | ICD-10-CM

## 2016-07-22 LAB — GLUCOSE, CAPILLARY
Glucose-Capillary: 153 mg/dL — ABNORMAL HIGH (ref 65–99)
Glucose-Capillary: 144 mg/dL — ABNORMAL HIGH (ref 65–99)
Glucose-Capillary: 131 mg/dL — ABNORMAL HIGH (ref 65–99)
Glucose-Capillary: 155 mg/dL — ABNORMAL HIGH (ref 65–99)
Glucose-Capillary: 130 mg/dL — ABNORMAL HIGH (ref 65–99)
Glucose-Capillary: 147 mg/dL — ABNORMAL HIGH (ref 65–99)

## 2016-07-22 LAB — BASIC METABOLIC PANEL
Anion gap: 7 (ref 5–15)
BUN: 37 mg/dL — ABNORMAL HIGH (ref 6–20)
CO2: 20 mmol/L — ABNORMAL LOW (ref 22–32)
Calcium: 8.6 mg/dL — ABNORMAL LOW (ref 8.9–10.3)
Chloride: 108 mmol/L (ref 101–111)
Creatinine, Ser: 1.08 mg/dL — ABNORMAL HIGH (ref 0.44–1.00)
GFR calc Af Amer: >60 mL/min (ref >60)
GFR calc non Af Amer: 55 mL/min — ABNORMAL LOW (ref >60)
Glucose, Bld: 161 mg/dL — ABNORMAL HIGH (ref 65–99)
Potassium: 4 mmol/L (ref 3.5–5.1)
Sodium: 135 mmol/L (ref 135–145)

## 2016-07-22 MED ORDER — FAT EMULSION 20 % IV EMUL
240.0000 mL | INTRAVENOUS | Status: AC
  Administered 2016-07-22: 240 mL via INTRAVENOUS
  Filled 2016-07-22: qty 250

## 2016-07-22 MED ORDER — M.V.I. ADULT IV INJ
INTRAVENOUS | Status: AC
  Administered 2016-07-22: 18:00:00 via INTRAVENOUS
  Filled 2016-07-22: qty 1992

## 2016-07-22 NOTE — Progress Notes (Signed)
PHARMACY - ADULT TOTAL PARENTERAL NUTRITION CONSULT NOTE   Pharmacy Consult:  TPN Indication:  Prolonged NPO status/high NG output   Patient Measurements: Height: 5\' 3"  (160 cm) Weight: 192 lb 14.4 oz (87.5 kg) IBW/kg (Calculated) : 52.4 TPN AdjBW (KG): 61.2 Body mass index is 34.17 kg/m. 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 Patient had a significant history of N/V for 2-4 weeks PTA but weight appears to have been maintained. Patient has had several NG tube replacement due to nausea and vomiting.  Pharmacy consulted to manage TPN since 06/23/16.    GI: persistent ileus and mesenteric ischemia. Prealbumin 10.4 >> 7.4, BM x5.  PPI IV.  Angiogram with possible TEVAR on Friday PM Endo: DM on glipizide/metformin PTA - CBGs improving, significant SSI use (SM 40mg  x 2 doses given 6/17) Insulin requirements in the past 24 hours: 24 units SSI, Lantus 40 units BID + 45 units in TPN Lytes: repeat labs - all WNL except mild hyponatremia and low CO2 Renal: SCr 1.0, BUN 37 - NS at 85ml/hr, UOP 0.8 ml/kg/hr, net +20L since admit - added furosemide Pulm: pleural effusion, down to 3L [place], Pulmicort, PRN albuterol Cards: Stanford type B aortic dissection (stable) - BP controlled, tachy resolving - Lopressor, clonidine 0.3 mg patch, PRN labetalol Hepatobil: ALT/ALT improving, tbili down 1.4, INR 1.72.  TG elevated at 223. Neuro: Celexa, Klonopin, PRN Fentanyl/Dialudid/Xanax/APAP - pain score 0-2 ID: 3d Cipro >> Vanc D#6/7 for E.faecalis UTI.  S/p Merrem (5/25-6/4) for PNA. C.diff negative.  Now afebrile, WBC WNL.  6/14 BCx NGTD Best Practices: SCDs, CHG, MC TPN Access: PICC placed 06/21/16  TPN start date: 06/23/16  Nutritional Goals (per RD recommendation on 6/18): 2000-2200 kCal and 110-120 gm protein  New goal if TG allows: Clinimix E 5/15 at 100 ml/hr + 20% ILE at 20 ml/hr x 12 hrs (= 2184 kCal and 120gm  protein per day)  Current Nutrition:   TPN Clear liquid diet Boost BID (received 1 yesterday) - each provides 9g protein + 250kcal   Plan:  - Continue Cinimix E 5/15 at 83 ml/hr and 20% ILE at 20 ml/hr x 12 hrs. TPN provides 1894 kCal and 100gm of protein per day, meeting > 90% of total needs.  Increase Clinimix to 100 ml/hr once CBGs are controlled. - Daily multivitamin in TPN - Trace elements in TPN every other day d/t shortage, next 6/21 - Continue TCTS SSI Q4H + Lantus 40 units BID.  - Repeat TG on Thurs - Monitor CO2 (Clinimix unable to provide additional bicarb, CVTS aware) - F/U CBGs  Rober Minion, PharmD., MS Clinical Pharmacist Pager:  [phone] Thank you for allowing pharmacy to be part of this patients care team. 07/22/2016, 8:42 AM

## 2016-07-22 NOTE — Progress Notes (Signed)
Patient ID: Haley Graham, female   DOB: 02-May-1955, 61 y.o.   MRN: [id number] EVENING ROUNDS NOTE :     [name]       [name] [id number]             [phone]                   Procedure(s) (LRB): ARCH ANGIOGRAM (N/A) POSSIBLE THORACIC AORTIC ENDOVASCULAR STENT GRAFT (N/A)  Total Length of Stay:  LOS: 32 days  BP (!) 121/57   Pulse (!) 106   Temp 99.9 F (37.7 C)   Resp (!) 31   Ht 5\' 3"  (1.6 m)   Wt 192 lb 14.4 oz (87.5 kg)   SpO2 98%   BMI 34.17 kg/m   .Intake/Output      06/20 0701 - 06/21 0700   P.O.    I.V. (mL/kg) 195.8 (2.2)   Total Intake(mL/kg) 195.8 (2.2)   Urine (mL/kg/hr) 750 (0.7)   Stool 0 (0)   Total Output 750   Net -554.2       Urine Occurrence 3 x   Stool Occurrence 3 x     . sodium chloride    . sodium chloride 10 mL/hr at 07/22/16 0700  . sodium chloride    . sodium chloride    . fat emulsion 240 mL (07/22/16 1800)  . vancomycin Stopped (07/22/16 1312)     Lab Results  Component Value Date   WBC 9.0 07/21/2016   HGB 10.0 (L) 07/21/2016   HCT 30.8 (L) 07/21/2016   PLT 90 (L) 07/21/2016   GLUCOSE 161 (H) 07/22/2016   TRIG 223 (H) 07/20/2016   ALT 85 (H) 07/20/2016   AST 26 07/20/2016   NA 135 07/22/2016   K 4.0 07/22/2016   CL 108 07/22/2016   CREATININE 1.08 (H) 07/22/2016   BUN 37 (H) 07/22/2016   CO2 20 (L) 07/22/2016   INR 1.72 07/19/2016   Walked 200-300 feet , still does not eat  [name]  Beeper [phone] Office [phone] 07/22/2016 7:08 PM

## 2016-07-22 NOTE — Procedures (Signed)
CVP line changed per protocol. Nurse notified. RT will continue to monitor

## 2016-07-22 NOTE — Progress Notes (Signed)
Patient ID: Haley Graham, female   DOB: [birth year], 61 y.o.   MRN: [id number]  [facility] Surgery Progress Note     Subjective: CC- SOB Patient sleeping this morning, husband at bedside. States that she had multiple BM's yesterday. Taking in very few clear liquids, but this does not increase abdominal pain. No recent n/v. Per husband patient has not been complaining of abdominal pain. On TNA.  Objective: Vital signs in last 24 hours: Temp:  [97.8 F (36.6 C)-100 F (37.8 C)] 97.8 F (36.6 C) (06/20 0700) Pulse Rate:  [95-110] 107 (06/20 1000) Resp:  [20-34] 22 (06/20 1000) BP: (95-146)/(46-73) 135/56 (06/20 1000) SpO2:  [92 %-100 %] 95 % (06/20 1000) Weight:  [192 lb 14.4 oz (87.5 kg)] 192 lb 14.4 oz (87.5 kg) (06/20 0400) Last BM Date: 07/22/16  Intake/Output from previous day: 06/19 0701 - 06/20 0700 In: 2427.6 [P.O.:180; I.V.:2247.6] Out: 1750 [Urine:1750] Intake/Output this shift: Total I/O In: 93 [I.V.:93] Out: -   PE: Gen:  Sleeping, NAD Card:  RRR, no M/G/R heard Pulm:  Mild increase work of breathing, few scattered rhonchi bilaterally Abd: Soft, distended, hypoactive BS, no HSM, no hernia, nontender Ext:  No erythema, edema, or tenderness BUE/BLE  Skin: no rashes or lesions noted  Lab Results:   Recent Labs  07/20/16 0430 07/21/16 0400  WBC 10.4 9.0  HGB 10.0* 10.0*  HCT 30.9* 30.8*  PLT 128* 90*   BMET  Recent Labs  07/21/16 1009 07/22/16 0352  NA 133* 135  K 4.4 4.0  CL 110 108  CO2 16* 20*  GLUCOSE 179* 161*  BUN 37* 37*  CREATININE 0.98 1.08*  CALCIUM 8.7* 8.6*   PT/INR No results for input(s): LABPROT, INR in the last 72 hours. CMP     Component Value Date/Time   NA 135 07/22/2016 0352   K 4.0 07/22/2016 0352   CL 108 07/22/2016 0352   CO2 20 (L) 07/22/2016 0352   GLUCOSE 161 (H) 07/22/2016 0352   BUN 37 (H) 07/22/2016 0352   CREATININE 1.08 (H) 07/22/2016 0352   CALCIUM 8.6 (L) 07/22/2016 0352   PROT 5.8 (L)  07/20/2016 0430   ALBUMIN 2.2 (L) 07/20/2016 0430   AST 26 07/20/2016 0430   ALT 85 (H) 07/20/2016 0430   ALKPHOS 95 07/20/2016 0430   BILITOT 1.4 (H) 07/20/2016 0430   GFRNONAA 55 (L) 07/22/2016 0352   GFRAA >60 07/22/2016 0352   Lipase     Component Value Date/Time   LIPASE 38 06/20/2016 0815       Studies/Results: No results found.  Anti-infectives: Anti-infectives    Start     Dose/Rate Route Frequency Ordered Stop   07/18/16 0000  levofloxacin (LEVAQUIN) IVPB 500 mg  Status:  Discontinued     500 mg 100 mL/hr over 60 Minutes Intravenous Every 24 hours 07/17/16 0903 07/17/16 0938   07/17/16 1100  vancomycin (VANCOCIN) 1,500 mg in sodium chloride 0.9 % 500 mL IVPB     1,500 mg 250 mL/hr over 120 Minutes Intravenous Every 24 hours 07/17/16 0946 07/24/16 1059   07/17/16 1000  levofloxacin (LEVAQUIN) tablet 500 mg  Status:  Discontinued     500 mg Oral Daily 07/17/16 0851 07/17/16 0903   07/15/16 0900  ciprofloxacin (CIPRO) tablet 500 mg  Status:  Discontinued     500 mg Oral 2 times daily 07/15/16 0828 07/17/16 0851   07/14/16 1115  levofloxacin (LEVAQUIN) tablet 500 mg  Status:  Discontinued  500 mg Oral Daily 07/14/16 1107 07/14/16 1214   06/26/16 0830  meropenem (MERREM) 1 g in sodium chloride 0.9 % 100 mL IVPB  Status:  Discontinued     1 g 200 mL/hr over 30 Minutes Intravenous Every 8 hours 06/26/16 0810 07/06/16 0804   06/22/16 1800  clindamycin (CLEOCIN) IVPB 600 mg  Status:  Discontinued     600 mg 100 mL/hr over 30 Minutes Intravenous Every 8 hours 06/22/16 1723 06/26/16 0810   06/22/16 1000  azithromycin (ZITHROMAX) 250 mg in dextrose 5 % 125 mL IVPB  Status:  Discontinued     250 mg 125 mL/hr over 60 Minutes Intravenous Every 24 hours 06/22/16 0826 06/27/16 1019   06/22/16 0900  levofloxacin (LEVAQUIN) IVPB 500 mg  Status:  Discontinued     500 mg 100 mL/hr over 60 Minutes Intravenous Every 24 hours 06/22/16 0759 06/22/16 0824        Assessment/Plan Diabetes Aortic dissection Left renal infarct Intestinal angina Ileus - abd films 6/17 showed persistent dilatation of small and large bowel consistent with ileus  - tolerating minimal clear liquids PO - having bowel function  FEN: TNA, clear liquids as tolerated VTE: lovenox ID: Vancomycin  DISPO: continue TNA and clear liquids as tolerated.  Patient going to OR with vascular this Friday for arch angiogram and possible TEVAR   LOS: 81 days    Jerrye Beavers , [facility] Surgery 07/22/2016, 10:30 AM Pager: [phone] Consults: [phone] Mon-Fri 7:00 am-4:30 pm Sat-Sun 7:00 am-11:30 am

## 2016-07-22 NOTE — Progress Notes (Signed)
          Daily Rounding Note  07/22/2016, 12:52 PM  LOS: 32 days   SUBJECTIVE:   Chief complaint:  Anorexia, persists   Stool watery, loose but not bloody today.  OBJECTIVE:         Vital signs in last 24 hours:    Temp:  [97.8 F (36.6 C)-100 F (37.8 C)] 97.8 F (36.6 C) (06/20 0700) Pulse Rate:  [95-110] 109 (06/20 1200) Resp:  [22-34] 23 (06/20 1200) BP: (95-146)/(46-73) 137/56 (06/20 1200) SpO2:  [92 %-98 %] 96 % (06/20 1200) Weight:  [87.5 kg (192 lb 14.4 oz)] 87.5 kg (192 lb 14.4 oz) (06/20 0400) Last BM Date: 07/22/16 Filed Weights   07/18/16 0613 07/21/16 0500 07/22/16 0400  Weight: 83.4 kg (183 lb 13.8 oz) 88 kg (194 lb 0.1 oz) 87.5 kg (192 lb 14.4 oz)   General: anxious, ill looking   Heart: RRR Chest: clear bil.   Abdomen: soft, NT.  Less distended.  BS hypoactive.  Bedpan contents of water mixed with brown liquid/atery stool is not bloody at all.     Extremities: no CCE Neuro/Psych:  A little bit calmer with husband at bedside.  No confusion.    Intake/Output from previous day: 06/19 0701 - 06/20 0700 In: 2427.6 [P.O.:180; I.V.:2247.6] Out: 1750 [Urine:1750]  Intake/Output this shift: Total I/O In: 133 [I.V.:133] Out: 350 [Urine:350]  Lab Results:  Recent Labs  07/20/16 0430 07/21/16 0400  WBC 10.4 9.0  HGB 10.0* 10.0*  HCT 30.9* 30.8*  PLT 128* 90*   BMET  Recent Labs  07/21/16 0400 07/21/16 1009 07/22/16 0352  NA 134* 133* 135  K 5.6* 4.4 4.0  CL 109 110 108  CO2 20* 16* 20*  GLUCOSE 188* 179* 161*  BUN 38* 37* 37*  CREATININE 0.99 0.98 1.08*  CALCIUM 8.5* 8.7* 8.6*   LFT  Recent Labs  07/20/16 0430  PROT 5.8*  ALBUMIN 2.2*  AST 26  ALT 85*  ALKPHOS 95  BILITOT 1.4*    Studies/Results: No results found.   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  Colonic, SB ileus per latest imaging.    *  Aortic dissection. Plan for OR,  angiogram, possible TEVAR with vascular on 6/22.     *  PCM.  TNA in place.    *  Anemia.  Initially macrocytic.  S/p PRBC x 3.  Hgb stable.    Low iron and TIBC.    *  Hyperkalemia.    *  Thrombocytopenia.    PLAN   *  Will sign off, call if GI reinvolvement needed.    Azucena Freed  07/22/2016, 12:52 PM Pager: [phone]    Attending physician's note   I have taken an interval history, reviewed the chart and examined the patient. I agree with the Advanced Practitioner's note, impression and recommendations. Ischemic bowel following aortic dissection with colonic and SB ileus. Given lack of significant improvement arch angiogram and possible TEVAR is planned for Friday afternoon. GI signing off, available if needed.   Lucio Edward, MD [facility] [phone] Mon-Fri 8a-5p [phone] after 5p, weekends, holidays

## 2016-07-22 NOTE — Progress Notes (Signed)
Pharmacy Antibiotic Note  Haley Graham is a 61 y.o. female with enterococcal UTI and noted with a PCN allergy.  Pharmacy consulted to dose vancomycin. Plan to treat for 7 days  Afebrile, WBC WNL.  Day #6/7 of vancomycin   Plan: -Continue Vancomycin 1500mg  IV Q24h- last dose due 6/21 to complete 7 days of therapy -Pharmacy to sign off at this time as no further adjustments anticipated. Please re-consult if needed  Height: 5\' 3"  (160 cm) Weight: 192 lb 14.4 oz (87.5 kg) IBW/kg (Calculated) : 52.4  Temp (24hrs), Avg:98.5 F (36.9 C), Min:97.8 F (36.6 C), Max:100 F (37.8 C)   Recent Labs Lab 07/16/16 1428 07/17/16 0458  07/18/16 0337 07/19/16 0420 07/20/16 0430 07/20/16 0759 07/20/16 0957 07/21/16 0400 07/21/16 1009 07/22/16 0352  WBC 8.5 9.2  --  9.5 8.1 10.4  --   --  9.0  --   --   CREATININE  --   --   < > 1.15* 1.23* 1.21* 1.19*  --  0.99 0.98 1.08*  LATICACIDVEN 1.7  --   --   --   --   --   --   --   --   --   --   VANCOTROUGH  --   --   --   --   --   --   --  13*  --   --   --   < > = values in this interval not displayed.  Estimated Creatinine Clearance: 58.8 mL/min (A) (by C-G formula based on SCr of 1.08 mg/dL (H)).    Allergies  Allergen Reactions  . Ativan [Lorazepam] Other (See Comments)    Makes pt very confused and more agitated, irritable.  . Codeine Nausea And Vomiting  . Reglan [Metoclopramide] Other (See Comments)    Heart races  . Penicillins Rash    Has patient had a PCN reaction causing immediate rash, facial/tongue/throat swelling, SOB or lightheadedness with hypotension: yes  Has patient had a PCN reaction causing severe rash involving mucus membranes or skin necrosis: no Has patient had a PCN reaction that required hospitalization no Has patient had a PCN reaction occurring within the last 10 years:no If all of the above answers are "NO", then may proceed with Cephalosporin use.    Antimicrobials this admission:  6/15 vanc>>  (6/21) 6/13 cipro>>6/15  Dose adjustments this admission:  6/18 VT = 13 (drawn ~ 1 hr early) on vanc 1500 q 24 > no changed  Microbiology results:  5/19 MRSA screen: neg 5/21 Ucx: neg 5/21 Bcx: neg 5/25 BCx: neg 5/27 BCx: neg 6/1 Cdif: neg 6/9 urine: enterococcus (> 100K). Pan-S 6/13 urine: enterococcus (> 100K). Pan-S 6/14 BCx: neg  Thank you for allowing pharmacy to be a part of this patient's care.  Duaa Stelzner D. Rehman Levinson, PharmD, BCPS Clinical Pharmacist Pager: [phone] Clinical Phone for 07/22/2016 until 3:30pm: [phone] If after 3:30pm, please call main pharmacy at [phone] 07/22/2016 9:15 AM

## 2016-07-22 NOTE — Progress Notes (Signed)
Physical Therapy Treatment Patient Details Name: Haley Graham MRN: [id number] DOB: [birth year] Today's Date: 07/22/2016    History of Present Illness Pt adm with type B aortic dissection. Treated non surgically with BP control and pain control. Pt developed pancreatitis and ileus with NG tube placed. PMH - anxiety, arthritis    PT Comments    Pt is limited by anxiety and pain and required maximal encouragement for participation in therapy today, however was able to ambulate 340 feet with RW and minAx2. Pt requires skilled PT to progress gait training and to maintain LE strength and endurance to safely mobilize in her discharge environment.     Follow Up Recommendations  Home health PT;Supervision/Assistance - 24 hour     Equipment Recommendations  Rolling walker with 5" wheels       Precautions / Restrictions Precautions Precautions: Fall Restrictions Weight Bearing Restrictions: No    Mobility  Bed Mobility Overal bed mobility: Needs Assistance Bed Mobility: Supine to Sit;Sit to Supine Rolling: Supervision Sidelying to sit: Min assist Supine to sit: HOB elevated;Min guard Sit to supine: Min assist Sit to sidelying: Min assist General bed mobility comments: increased time/effort; cues for sequencing  Transfers Overall transfer level: Needs assistance Equipment used: Rolling walker (2 wheeled) Transfers: Sit to/from [facility] Sit to Stand: Min guard;Min assist         General transfer comment: multiple sit<>stand from [facility] with minA before convinced to ambulate in hallway  Ambulation/Gait Ambulation/Gait assistance: +2 safety/equipment;Min assist (chair follow) Ambulation Distance (Feet): 340 Feet Assistive device: Rolling walker (2 wheeled) Gait Pattern/deviations: Step-through pattern;Decreased stride length;Drifts right/left;Antalgic Gait velocity: decr Gait velocity interpretation: Below normal speed for age/gender General Gait Details:  maximal verbal encouragement required for pt to walk unit today, minAx2 for maintaining upright and guiding walker          Balance Overall balance assessment: Needs assistance Sitting-balance support: No upper extremity supported;Feet supported Sitting balance-Leahy Scale: Good     Standing balance support: Bilateral upper extremity supported;During functional activity Standing balance-Leahy Scale: Poor Standing balance comment: walker and min guard assist for static standing                            Cognition Arousal/Alertness: Awake/alert Behavior During Therapy: Anxious Overall Cognitive Status: Impaired/Different from baseline Area of Impairment: Following commands;Attention;Problem solving                   Current Attention Level: Sustained   Following Commands: Follows multi-step commands consistently Safety/Judgement: Decreased awareness of safety Awareness: Emergent Problem Solving: Decreased initiation;Requires verbal cues;Slow processing General Comments: with maximal encouragement able to follow commands for ambulation         General Comments General comments (skin integrity, edema, etc.): VSS 3L O2 via nasal cannula, SaO2 >90% O2 throughout session despite SoB with activity      Pertinent Vitals/Pain Pain Assessment: 0-10 Pain Score: 8  Pain Location: back and abdomen Pain Descriptors / Indicators: Grimacing;Aching Pain Intervention(s): Monitored during session           PT Goals (current goals can now be found in the care plan section) Acute Rehab PT Goals PT Goal Formulation: With patient/family Time For Goal Achievement: 08/05/16 Potential to Achieve Goals: Fair    Frequency    Min 3X/week      PT Plan Current plan remains appropriate       AM-PAC PT "6 Clicks" Daily Activity  Outcome Measure  Difficulty turning over in bed (including adjusting bedclothes, sheets and blankets)?: A Little Difficulty moving  from lying on back to sitting on the side of the bed? : A Lot Difficulty sitting down on and standing up from a chair with arms (e.g., wheelchair, bedside commode, etc,.)?: A Little Help needed moving to and from a bed to chair (including a wheelchair)?: A Little Help needed walking in hospital room?: A Little Help needed climbing 3-5 steps with a railing? : A Lot 6 Click Score: 16    End of Session Equipment Utilized During Treatment: Gait belt;Oxygen Activity Tolerance: Patient limited by fatigue;Patient limited by pain Patient left: in bed;with call bell/phone within reach;with family/visitor present Nurse Communication: Mobility status PT Visit Diagnosis: Muscle weakness (generalized) (M62.81);Other abnormalities of gait and mobility (R26.89)     Time: 1829-9371 PT Time Calculation (min) (ACUTE ONLY): 45 min  Charges:  $Gait Training: 23-37 mins $Therapeutic Activity: 8-22 mins                    G Codes:       Tomoko Sandra B. Migdalia Dk PT, DPT Acute Rehabilitation  [phone] Pager [phone]     [place] 07/22/2016, 2:02 PM

## 2016-07-22 NOTE — Progress Notes (Signed)
Subjective    Patient resting in bed Family at bedside   Physical Exam:  Sleeping Non-labored breathing Palpable pedal pulses       Assessment/Plan:  complex Type IIIb aortic dissection  I spent approximately 30 minutes discussing the details of the anticipated procedure with the husband and daughter today.  We discussed starting with a diagnostic angiogram via a left brachial approach, and then attempting TEVAR.  We discussed using IVUS and covering the initial tear of the dissection.  This has the potential to improve visceral perfusion, but carries multiple risks, including but not limited to death, paralysis, lower extremity ischemia, renal dysfunction, including dialysis, intestinal ischemia.  She has failed conservative, non-operative management, and our best hope at a meaningful recovery is to proceed with repair of the dissection.  They are aware of the risks and are willing to proceed.  This has been planned for Friday afternoon.  Annamarie Major 07/22/2016 8:52 PM --  Vitals:   07/22/16 1800 07/22/16 1940  BP: (!) 121/57   Pulse: (!) 106   Resp: (!) 31   Temp:  (!) 100.4 F (38 C)    Intake/Output Summary (Last 24 hours) at 07/22/16 2052 Last data filed at 07/22/16 1900  Gross per 24 hour  Intake          1548.77 ml  Output             1400 ml  Net           148.77 ml     Laboratory CBC    Component Value Date/Time   WBC 9.0 07/21/2016 0400   HGB 10.0 (L) 07/21/2016 0400   HCT 30.8 (L) 07/21/2016 0400   PLT 90 (L) 07/21/2016 0400    BMET    Component Value Date/Time   NA 135 07/22/2016 0352   K 4.0 07/22/2016 0352   CL 108 07/22/2016 0352   CO2 20 (L) 07/22/2016 0352   GLUCOSE 161 (H) 07/22/2016 0352   BUN 37 (H) 07/22/2016 0352   CREATININE 1.08 (H) 07/22/2016 0352   CALCIUM 8.6 (L) 07/22/2016 0352   GFRNONAA 55 (L) 07/22/2016 0352   GFRAA >60 07/22/2016 0352    COAG Lab Results  Component Value Date   INR 1.72 07/19/2016   No  results found for: PTT  Antibiotics Anti-infectives    Start     Dose/Rate Route Frequency Ordered Stop   07/18/16 0000  levofloxacin (LEVAQUIN) IVPB 500 mg  Status:  Discontinued     500 mg 100 mL/hr over 60 Minutes Intravenous Every 24 hours 07/17/16 0903 07/17/16 0938   07/17/16 1100  vancomycin (VANCOCIN) 1,500 mg in sodium chloride 0.9 % 500 mL IVPB     1,500 mg 250 mL/hr over 120 Minutes Intravenous Every 24 hours 07/17/16 0946 07/24/16 1059   07/17/16 1000  levofloxacin (LEVAQUIN) tablet 500 mg  Status:  Discontinued     500 mg Oral Daily 07/17/16 0851 07/17/16 0903   07/15/16 0900  ciprofloxacin (CIPRO) tablet 500 mg  Status:  Discontinued     500 mg Oral 2 times daily 07/15/16 0828 07/17/16 0851   07/14/16 1115  levofloxacin (LEVAQUIN) tablet 500 mg  Status:  Discontinued     500 mg Oral Daily 07/14/16 1107 07/14/16 1214   06/26/16 0830  meropenem (MERREM) 1 g in sodium chloride 0.9 % 100 mL IVPB  Status:  Discontinued     1 g 200 mL/hr over 30 Minutes Intravenous Every 8 hours  06/26/16 0810 07/06/16 0804   06/22/16 1800  clindamycin (CLEOCIN) IVPB 600 mg  Status:  Discontinued     600 mg 100 mL/hr over 30 Minutes Intravenous Every 8 hours 06/22/16 1723 06/26/16 0810   06/22/16 1000  azithromycin (ZITHROMAX) 250 mg in dextrose 5 % 125 mL IVPB  Status:  Discontinued     250 mg 125 mL/hr over 60 Minutes Intravenous Every 24 hours 06/22/16 0826 06/27/16 1019   06/22/16 0900  levofloxacin (LEVAQUIN) IVPB 500 mg  Status:  Discontinued     500 mg 100 mL/hr over 60 Minutes Intravenous Every 24 hours 06/22/16 0759 06/22/16 0824       V. Leia Alf, M.D. [facility] Office: [phone] Pager:  [phone]

## 2016-07-23 ENCOUNTER — Inpatient Hospital Stay (HOSPITAL_COMMUNITY): Payer: BLUE CROSS/BLUE SHIELD

## 2016-07-23 DIAGNOSIS — I7102 Dissection of abdominal aorta: Secondary | ICD-10-CM

## 2016-07-23 LAB — CBC WITH DIFFERENTIAL/PLATELET
Basophils Absolute: 0.2 10*3/uL — ABNORMAL HIGH (ref 0.0–0.1)
Basophils Relative: 3 %
Eosinophils Absolute: 0.1 10*3/uL (ref 0.0–0.7)
Eosinophils Relative: 1 %
HCT: 27.7 % — ABNORMAL LOW (ref 36.0–46.0)
Hemoglobin: 8.4 g/dL — ABNORMAL LOW (ref 12.0–15.0)
Lymphocytes Relative: 21 %
Lymphs Abs: 1.7 10*3/uL (ref 0.7–4.0)
MCH: 30 pg (ref 26.0–34.0)
MCHC: 30.3 g/dL (ref 30.0–36.0)
MCV: 98.9 fL (ref 78.0–100.0)
Monocytes Absolute: 0.5 10*3/uL (ref 0.1–1.0)
Monocytes Relative: 6 %
Neutro Abs: 5.6 10*3/uL (ref 1.7–7.7)
Neutrophils Relative %: 69 %
Platelets: 77 10*3/uL — ABNORMAL LOW (ref 150–400)
RBC: 2.8 MIL/uL — ABNORMAL LOW (ref 3.87–5.11)
RDW: 16.8 % — ABNORMAL HIGH (ref 11.5–15.5)
WBC: 8.1 10*3/uL (ref 4.0–10.5)

## 2016-07-23 LAB — COMPREHENSIVE METABOLIC PANEL
ALBUMIN: 1.6 g/dL — AB (ref 3.5–5.0)
ALT: 47 U/L (ref 14–54)
ANION GAP: 5 (ref 5–15)
AST: 25 U/L (ref 15–41)
Alkaline Phosphatase: 88 U/L (ref 38–126)
BUN: 32 mg/dL — AB (ref 6–20)
CHLORIDE: 107 mmol/L (ref 101–111)
CO2: 22 mmol/L (ref 22–32)
Calcium: 8.2 mg/dL — ABNORMAL LOW (ref 8.9–10.3)
Creatinine, Ser: 1.14 mg/dL — ABNORMAL HIGH (ref 0.44–1.00)
GFR calc Af Amer: 60 mL/min — ABNORMAL LOW (ref >60)
GFR calc non Af Amer: 52 mL/min — ABNORMAL LOW (ref >60)
GLUCOSE: 103 mg/dL — AB (ref 65–99)
POTASSIUM: 3.5 mmol/L (ref 3.5–5.1)
Sodium: 134 mmol/L — ABNORMAL LOW (ref 135–145)
TOTAL PROTEIN: 5 g/dL — AB (ref 6.5–8.1)
Total Bilirubin: 1.5 mg/dL — ABNORMAL HIGH (ref 0.3–1.2)

## 2016-07-23 LAB — GLUCOSE, CAPILLARY
Glucose-Capillary: 99 mg/dL (ref 65–99)
Glucose-Capillary: 134 mg/dL — ABNORMAL HIGH (ref 65–99)
Glucose-Capillary: 142 mg/dL — ABNORMAL HIGH (ref 65–99)
Glucose-Capillary: 112 mg/dL — ABNORMAL HIGH (ref 65–99)
Glucose-Capillary: 130 mg/dL — ABNORMAL HIGH (ref 65–99)
Glucose-Capillary: 181 mg/dL — ABNORMAL HIGH (ref 65–99)

## 2016-07-23 LAB — PHOSPHORUS: PHOSPHORUS: 3.3 mg/dL (ref 2.5–4.6)

## 2016-07-23 LAB — MAGNESIUM: MAGNESIUM: 1.5 mg/dL — AB (ref 1.7–2.4)

## 2016-07-23 MED ORDER — MAGNESIUM SULFATE 2 GM/50ML IV SOLN
2.0000 g | Freq: Once | INTRAVENOUS | Status: AC
  Administered 2016-07-23: 2 g via INTRAVENOUS
  Filled 2016-07-23: qty 50

## 2016-07-23 MED ORDER — FAT EMULSION 20 % IV EMUL
240.0000 mL | INTRAVENOUS | Status: AC
  Administered 2016-07-23: 240 mL via INTRAVENOUS
  Filled 2016-07-23: qty 250

## 2016-07-23 MED ORDER — DEXTROSE 5 % IV SOLN
1.0000 g | Freq: Three times a day (TID) | INTRAVENOUS | Status: DC
  Filled 2016-07-23: qty 1

## 2016-07-23 MED ORDER — MEROPENEM 1 G IV SOLR
1.0000 g | Freq: Three times a day (TID) | INTRAVENOUS | Status: DC
  Administered 2016-07-23 – 2016-07-25 (×7): 1 g via INTRAVENOUS
  Filled 2016-07-23 (×8): qty 1

## 2016-07-23 MED ORDER — TRACE MINERALS CR-CU-MN-SE-ZN 10-1000-500-60 MCG/ML IV SOLN
INTRAVENOUS | Status: AC
  Administered 2016-07-23: 17:00:00 via INTRAVENOUS
  Filled 2016-07-23: qty 2400

## 2016-07-23 MED ORDER — POTASSIUM CHLORIDE 10 MEQ/50ML IV SOLN
10.0000 meq | INTRAVENOUS | Status: AC
  Administered 2016-07-23 (×3): 10 meq via INTRAVENOUS
  Filled 2016-07-23 (×3): qty 50

## 2016-07-23 NOTE — Progress Notes (Signed)
      [address]       [place] [address]             [phone]      Stable day but no significant improvement  For aortagram, possible TEVAR in AM  [place] C. [name], MD [facility] [phone]

## 2016-07-23 NOTE — Progress Notes (Signed)
Patient did not want to ambulate or get up to the chair, patient complained of abdominal pain when RN raised head of bed to sitting position.  RN got patient to get up to chair.  Patient continued to complain of abdominal pain, RN asked patient to describe pain and patient was unable to. RN gave PRN simethicone.  Patient then asked RN for something for sleep, RN advised patient it was 0600 and patient needed to be awake for the day and RN would need patient to ambulate later in the shift.

## 2016-07-23 NOTE — Progress Notes (Signed)
PHARMACY - ADULT TOTAL PARENTERAL NUTRITION CONSULT NOTE   Pharmacy Consult:  TPN Indication:  Prolonged NPO status/high NG output   Patient Measurements: Height: 5\' 3"  (160 cm) Weight: 192 lb 14.4 oz (87.5 kg) IBW/kg (Calculated) : 52.4 TPN AdjBW (KG): 61.2 Body mass index is 34.17 kg/m. 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 Patient had a significant history of N/V for 2-4 weeks PTA but weight appears to have been maintained. Patient has had several NG tube replacement due to nausea and vomiting.  Pharmacy consulted to manage TPN since 06/23/16.    GI: persistent ileus and mesenteric ischemia. Prealbumin 10.4 >> 7.4, BM 6/20.  PPI IV.  Angiogram with possible TEVAR on Friday PM  Endo: DM on glipizide/metformin PTA - CBGs improving, significant SSI use (SM 40mg  x 2 doses given 6/17) Insulin requirements in the past 24 hours: 16 units SSI, Lantus 40 units BID + 60 units in TPN  Lytes: Mild hyponatremia, borderline hypokalemia, hypomagnesemia - will replace.  Renal: SCr 1.1, BUN 32 - NS at 62ml/hr, UOP 0.8 ml/kg/hr, net +20L since admit - added furosemide  Pulm: pleural effusion, down to 3L  - Brovana, Pulmicort, PRN albuterol  Cards: Tachy - Stanford type B aortic dissection (stable) - BP controlled, - Lopressor, clonidine 0.3 mg patch, PRN labetalol.  Plans for surgery on Friday.  Hepatobil: AST/ALT wnl -  improving, tbili 1.5, INR 1.72.  TG elevated at 223.  Neuro: Celexa, Klonopin, PRN Fentanyl/Dialudid/Xanax/APAP - pain score 0-2  ID: 3d Cipro >> Vanc D#7/7 for E.faecalis UTI.  S/p Merrem (5/25-6/4) for PNA. C.diff negative.  Now slight spike in temp to 100.8, WBC WNL.  6/14 BCx NGTD  Best Practices: SCDs, CHG, MC TPN Access: PICC placed 06/21/16  TPN start date: 06/23/16  Nutritional Goals (per RD recommendation on 6/18): 2000-2200 kCal and 110-120 gm protein  New goal if TG allows:  Clinimix E 5/15 at 100 ml/hr + 20% ILE at 20 ml/hr x 12 hrs (= 2184 kCal and 120gm protein per day)  Current Nutrition:   TPN Clear liquid diet Boost BID    Plan:  - Increase Cinimix E 5/15 to 100 ml/hr and 20% ILE at 20 ml/hr x 12 hrs. TPN provides 2184 kCal and 120gm of protein per day, meeting > 100% of total needs.   - Daily multivitamin in TPN - Trace elements in TPN every other day d/t shortage, next 6/23 - Continue TCTS SSI Q4H + Lantus 40 units BID and 60 units in TPN - Magnesium bolus of 2 gm IV x 1 - KCL 18meq x 1 (3 x 51meq runs) - Repeat TG on Thurs - F/U CBGs  Rober Minion, PharmD., MS Clinical Pharmacist Pager:  [phone] Thank you for allowing pharmacy to be part of this patients care team. 07/23/2016, 7:29 AM

## 2016-07-23 NOTE — Progress Notes (Signed)
  Progress Note    07/23/2016 12:40 PM * No surgery date entered *  Subjective:  Having abdominal pain  Vitals:   07/23/16 1100 07/23/16 1200  BP: 109/69 (!) 117/49  Pulse: (!) 109 (!) 102  Resp: (!) 29 (!) 26  Temp:  98 F (36.7 C)    Physical Exam: Awake and alert Abdomen remains distended Palpable dp bilterally, palpable left brachial pulse Moves all extremities to command  CBC    Component Value Date/Time   WBC 8.1 07/23/2016 1035   RBC 2.80 (L) 07/23/2016 1035   HGB 8.4 (L) 07/23/2016 1035   HCT 27.7 (L) 07/23/2016 1035   PLT PENDING 07/23/2016 1035   MCV 98.9 07/23/2016 1035   MCH 30.0 07/23/2016 1035   MCHC 30.3 07/23/2016 1035   RDW 16.8 (H) 07/23/2016 1035   LYMPHSABS PENDING 07/23/2016 1035   MONOABS PENDING 07/23/2016 1035   EOSABS PENDING 07/23/2016 1035   BASOSABS PENDING 07/23/2016 1035    BMET    Component Value Date/Time   NA 134 (L) 07/23/2016 0409   K 3.5 07/23/2016 0409   CL 107 07/23/2016 0409   CO2 22 07/23/2016 0409   GLUCOSE 103 (H) 07/23/2016 0409   BUN 32 (H) 07/23/2016 0409   CREATININE 1.14 (H) 07/23/2016 0409   CALCIUM 8.2 (L) 07/23/2016 0409   GFRNONAA 52 (L) 07/23/2016 0409   GFRAA 60 (L) 07/23/2016 0409    INR    Component Value Date/Time   INR 1.72 07/19/2016 0630     Intake/Output Summary (Last 24 hours) at 07/23/16 1240 Last data filed at 07/23/16 1200  Gross per 24 hour  Intake          2466.77 ml  Output              850 ml  Net          1616.77 ml     Assessment:  61 y.o. female is here with type b aortic dissection with abdominal distension, pna on abx, deconditioned on tpn  Plan: Aortogram with possible tevar tomorrow. Discussed with daughter at bedside the risks of death, mi, stroke, paralysis, renal failure with possible dialysis, bowel ischemia. She demonstrates good understanding and we will proceed tomorrow.    Shirleen Mcfaul C. Donzetta Matters, MD [facility] Office:  [phone] Pager: [phone]  07/23/2016 12:40 PM

## 2016-07-23 NOTE — Care Management Note (Signed)
Case Management Note Marvetta Gibbons RN, BSN Unit 2W-Case Manager-- [place] coverage [phone]  Patient Details  Name: Haley Graham MRN: [id number] Date of Birth: 1955/07/27  Subjective/Objective:  Pt admitted with Stanford type B aortic dissection-- With pancreatitis and ileus after Stanford type B dissection- has NGT in place                  Action/Plan: PTA pt lived at home with spouse- referral for FMLA paperwork needed by daughter- have referred her to her mother's primary care doctor regarding this- CM to follow for d/c needs  Expected Discharge Date:                  Expected Discharge Plan:  [place]  In-House Referral:     Discharge planning Services  CM Consult  Post Acute Care Choice:  Home Health Choice offered to:     DME Arranged:    DME Agency:     HH Arranged:    [place] Agency:     Status of Service:  In process, will continue to follow  If discussed at Long Length of Stay Meetings, dates discussed:  6/5, 6/7, 6/12, 6/14, 6/19, 6/21  Discharge Disposition:   Additional Comments:   07/23/16- 1000- Marvetta Gibbons RN, CM- pt continues with prolonged ileus and ischemia - on TPA and only sips of clears- plan per notes for possible thoracic aortic endovascular stent later this week.  Per PT recommendations for [facility] f/u - will need orders prior to discharge- CM will continue to follow for d/c needs when medically ready.   07/07/16- 1245- Paolo Okane RN, CM- pt remains- NPO, TNA- per MD note- plan repeat CT abd-pelvis to assess bowel viability- CM to continue to follow.   Dawayne Patricia, RN 07/23/2016, 10:10 AM

## 2016-07-23 NOTE — Progress Notes (Signed)
TPN order currently expired.  In shift report at 2300, RN was advised by Kathe Becton RN that IV team had been called to inform them of order expired and IV team had stated they would renew the order.  No new order received.  Reviewed expired order and pharmacy listed as ordering provider.  Called pharmacy and spoke with Jeneen Rinks, pharmacy advised to leave TPN running at this time and order will be reviewed in the morning when pharmacist that handles TPN arrives.

## 2016-07-23 NOTE — Progress Notes (Signed)
Procedure(s) (LRB): ARCH ANGIOGRAM (N/A) POSSIBLE THORACIC AORTIC ENDOVASCULAR STENT GRAFT (N/A) Subjective:  BP, HR stable Recurrent low grade fever enterocococal UTI treated with vanco - day 6 CT shows LLL atelectasis vs pneumonia WBC normal Prior to TEVAR will reculture blood, urine and add cefepime for broader coverage  Objective: Vital signs in last 24 hours: Temp:  [97.8 F (36.6 C)-100.8 F (38.2 C)] 97.8 F (36.6 C) (06/21 0800) Pulse Rate:  [95-114] 102 (06/21 0800) Cardiac Rhythm: Sinus tachycardia (06/20 2000) Resp:  [20-33] 23 (06/21 0800) BP: (104-137)/(42-64) 121/51 (06/21 0800) SpO2:  [95 %-99 %] 97 % (06/21 0800) Weight:  [192 lb 14.4 oz (87.5 kg)] 192 lb 14.4 oz (87.5 kg) (06/21 0400)  Hemodynamic parameters for last 24 hours:    Intake/Output from previous day: 06/20 0701 - 06/21 0700 In: 1671.8 [P.O.:120; I.V.:1551.8] Out: 1200 [Urine:1200] Intake/Output this shift: No intake/output data recorded.       Exam    General- alert and comfortable   Lungs- clear without rales, wheezes   Cor- regular rate and rhythm, no murmur , gallop   Abdomen- soft, non-tender   Extremities - warm, non-tender, minimal edema   Neuro- oriented, appropriate, no focal weakness   Lab Results:  Recent Labs  07/21/16 0400  WBC 9.0  HGB 10.0*  HCT 30.8*  PLT 90*   BMET:  Recent Labs  07/22/16 0352 07/23/16 0409  NA 135 134*  K 4.0 3.5  CL 108 107  CO2 20* 22  GLUCOSE 161* 103*  BUN 37* 32*  CREATININE 1.08* 1.14*  CALCIUM 8.6* 8.2*    PT/INR: No results for input(s): LABPROT, INR in the last 72 hours. ABG    Component Value Date/Time   PHART 7.430 06/28/2016 1615   HCO3 20.7 06/28/2016 1615   TCO2 22 06/28/2016 1615   ACIDBASEDEF 3.0 (H) 06/28/2016 1615   O2SAT 93.0 06/28/2016 1615   CBG (last 3)   Recent Labs  07/22/16 2307 07/23/16 0423 07/23/16 0800  GLUCAP 147* 99 134*    Assessment/Plan: S/P Procedure(s) (LRB): ARCH ANGIOGRAM  (N/A) POSSIBLE THORACIC AORTIC ENDOVASCULAR STENT GRAFT (N/A) TEVAR in am   LOS: 33 days    Haley Graham 07/23/2016

## 2016-07-23 NOTE — Progress Notes (Signed)
RN assisted patient on bedpan and patient complained of 8/10 abdominal pain and asked RN for pain medication.  RN obtained PRN fentanyl 25 mcg, RN told patient and patient's husband what pain medication RN was administering.  Patient's husband stated he thought PRN fentanyl had been discontinued, RN advised patient's husband PRN fentanyl 25 mcg was still ordered and could be given two times a day if patient was experiencing severe pain.  Patient's husband advised RN it was "okay to give" PRN fentanyl.    After RN administered PRN Fentanyl, patient's husband stated that the PRN medication really made the patient sleepy and affected her ability to ambulate and the patient's husband felt that the PRN pain medication and the lack of ambulation is the reason patient has had a slow recovery and GI issues.  Patient's husband also stated that he felt the patient wasn't really having pain but was having gas pain and the PRN simethicone was all that was needed to be given and not the PRN fentanyl.  Patient's husband acknowledged to RN that he did not provide this information to RN prior to administering PRN fentanyl, RN administered PRN fentanyl per patient's complaint and description of pain and per orders as well as after patient's husband advised RN to proceed with administering PRN Fentanyl.  Patient's husband stated he would like the PRN fentanyl and PRN dilaudid orders to be discontinued.  RN advised she would discuss with oncoming RN to discuss with MD's during morning rounds.  Patient's husband then stated he didn't want PRN orders discontinued, he just felt like all the patient wanted to do was sleep and is asking for pain medication even if she isn't hurting because she wants to sleep and patient's husband just wants to make sure RN's are using the PRN medications sparingly.  RN advised patient's husband that concerns will be reviewed with oncoming RN and MD's during morning rounds.

## 2016-07-24 ENCOUNTER — Encounter (HOSPITAL_COMMUNITY)
Admission: EM | Disposition: A | Discharge status: Home Health | Payer: Self-pay | Source: Home / Self Care | Attending: Cardiothoracic Surgery

## 2016-07-24 ENCOUNTER — Inpatient Hospital Stay (HOSPITAL_COMMUNITY): Payer: BLUE CROSS/BLUE SHIELD | Admitting: Certified Registered Nurse Anesthetist

## 2016-07-24 ENCOUNTER — Encounter (HOSPITAL_COMMUNITY): Payer: Self-pay | Admitting: Surgery

## 2016-07-24 ENCOUNTER — Inpatient Hospital Stay (HOSPITAL_COMMUNITY): Payer: BLUE CROSS/BLUE SHIELD

## 2016-07-24 HISTORY — PX: AORTOGRAM: SHX6300

## 2016-07-24 LAB — GLUCOSE, CAPILLARY
Glucose-Capillary: 189 mg/dL — ABNORMAL HIGH (ref 65–99)
Glucose-Capillary: 192 mg/dL — ABNORMAL HIGH (ref 65–99)
Glucose-Capillary: 194 mg/dL — ABNORMAL HIGH (ref 65–99)
Glucose-Capillary: 196 mg/dL — ABNORMAL HIGH (ref 65–99)
Glucose-Capillary: 202 mg/dL — ABNORMAL HIGH (ref 65–99)
Glucose-Capillary: 228 mg/dL — ABNORMAL HIGH (ref 65–99)

## 2016-07-24 LAB — POCT I-STAT 4, (NA,K, GLUC, HGB,HCT)
Glucose, Bld: 193 mg/dL — ABNORMAL HIGH (ref 65–99)
HCT: 24 % — ABNORMAL LOW (ref 36.0–46.0)
Hemoglobin: 8.2 g/dL — ABNORMAL LOW (ref 12.0–15.0)
Potassium: 3.5 mmol/L (ref 3.5–5.1)
Sodium: 136 mmol/L (ref 135–145)

## 2016-07-24 LAB — SURGICAL PCR SCREEN
MRSA, PCR: NEGATIVE
Staphylococcus aureus: NEGATIVE

## 2016-07-24 LAB — PATHOLOGIST SMEAR REVIEW

## 2016-07-24 SURGERY — AORTOGRAM
Anesthesia: General

## 2016-07-24 MED ORDER — LACTATED RINGERS IV SOLN
INTRAVENOUS | Status: DC | PRN
  Administered 2016-07-24: 13:00:00 via INTRAVENOUS

## 2016-07-24 MED ORDER — MIDAZOLAM HCL 2 MG/2ML IJ SOLN
INTRAMUSCULAR | Status: AC
  Filled 2016-07-24: qty 2

## 2016-07-24 MED ORDER — SUGAMMADEX SODIUM 200 MG/2ML IV SOLN
INTRAVENOUS | Status: DC | PRN
  Administered 2016-07-24: 200 mg via INTRAVENOUS

## 2016-07-24 MED ORDER — SUGAMMADEX SODIUM 200 MG/2ML IV SOLN
INTRAVENOUS | Status: AC
  Filled 2016-07-24: qty 2

## 2016-07-24 MED ORDER — ONDANSETRON HCL 4 MG/2ML IJ SOLN
INTRAMUSCULAR | Status: DC | PRN
  Administered 2016-07-24: 4 mg via INTRAVENOUS

## 2016-07-24 MED ORDER — SUCCINYLCHOLINE CHLORIDE 200 MG/10ML IV SOSY
PREFILLED_SYRINGE | INTRAVENOUS | Status: DC | PRN
  Administered 2016-07-24: 100 mg via INTRAVENOUS

## 2016-07-24 MED ORDER — PROPOFOL 10 MG/ML IV BOLUS
INTRAVENOUS | Status: AC
  Filled 2016-07-24: qty 20

## 2016-07-24 MED ORDER — SUCCINYLCHOLINE CHLORIDE 200 MG/10ML IV SOSY
PREFILLED_SYRINGE | INTRAVENOUS | Status: AC
  Filled 2016-07-24: qty 10

## 2016-07-24 MED ORDER — SODIUM CHLORIDE 0.9 % IV SOLN
INTRAVENOUS | Status: DC | PRN
  Administered 2016-07-24: 14:00:00

## 2016-07-24 MED ORDER — PHENYLEPHRINE 40 MCG/ML (10ML) SYRINGE FOR IV PUSH (FOR BLOOD PRESSURE SUPPORT)
PREFILLED_SYRINGE | INTRAVENOUS | Status: AC
  Filled 2016-07-24: qty 10

## 2016-07-24 MED ORDER — MIDAZOLAM HCL 5 MG/5ML IJ SOLN
INTRAMUSCULAR | Status: DC | PRN
  Administered 2016-07-24: 1 mg via INTRAVENOUS

## 2016-07-24 MED ORDER — CLINIMIX E/DEXTROSE (5/15) 5 % IV SOLN
INTRAVENOUS | Status: AC
  Administered 2016-07-24 (×2): via INTRAVENOUS
  Filled 2016-07-24: qty 2400

## 2016-07-24 MED ORDER — DEXTROSE 5 % IV SOLN
INTRAVENOUS | Status: DC | PRN
  Administered 2016-07-24: 25 ug/min via INTRAVENOUS

## 2016-07-24 MED ORDER — FENTANYL CITRATE (PF) 250 MCG/5ML IJ SOLN
INTRAMUSCULAR | Status: AC
  Filled 2016-07-24: qty 5

## 2016-07-24 MED ORDER — ONDANSETRON HCL 4 MG/2ML IJ SOLN
INTRAMUSCULAR | Status: AC
  Filled 2016-07-24: qty 2

## 2016-07-24 MED ORDER — FENTANYL CITRATE (PF) 100 MCG/2ML IJ SOLN: 25.0000 ug | INTRAMUSCULAR | Status: DC | PRN

## 2016-07-24 MED ORDER — ROCURONIUM BROMIDE 100 MG/10ML IV SOLN
INTRAVENOUS | Status: DC | PRN
  Administered 2016-07-24: 50 mg via INTRAVENOUS

## 2016-07-24 MED ORDER — IOPAMIDOL (ISOVUE-300) INJECTION 61%
INTRAVENOUS | Status: DC | PRN
  Administered 2016-07-24: 150 mL via INTRA_ARTERIAL

## 2016-07-24 MED ORDER — PHENYLEPHRINE HCL 10 MG/ML IJ SOLN
INTRAMUSCULAR | Status: DC | PRN
  Administered 2016-07-24 (×2): 80 ug via INTRAVENOUS

## 2016-07-24 MED ORDER — PROPOFOL 10 MG/ML IV BOLUS
INTRAVENOUS | Status: DC | PRN
  Administered 2016-07-24: 100 mg via INTRAVENOUS

## 2016-07-24 MED ORDER — 0.9 % SODIUM CHLORIDE (POUR BTL) OPTIME
TOPICAL | Status: DC | PRN
  Administered 2016-07-24: 1000 mL

## 2016-07-24 MED ORDER — FAT EMULSION 20 % IV EMUL
240.0000 mL | INTRAVENOUS | Status: AC
  Administered 2016-07-24: 240 mL via INTRAVENOUS
  Filled 2016-07-24: qty 250

## 2016-07-24 MED ORDER — FENTANYL CITRATE (PF) 100 MCG/2ML IJ SOLN
INTRAMUSCULAR | Status: DC | PRN
  Administered 2016-07-24: 50 ug via INTRAVENOUS

## 2016-07-24 MED ORDER — ROCURONIUM BROMIDE 10 MG/ML (PF) SYRINGE
PREFILLED_SYRINGE | INTRAVENOUS | Status: AC
  Filled 2016-07-24: qty 5

## 2016-07-24 SURGICAL SUPPLY — 75 items
ADH SKN CLS APL DERMABOND .7 (GAUZE/BANDAGES/DRESSINGS) ×1
BAG SNAP BAND KOVER 36X36 (MISCELLANEOUS) ×5 IMPLANT
BANDAGE ACE 4X5 VEL STRL LF (GAUZE/BANDAGES/DRESSINGS) IMPLANT
BANDAGE ESMARK 6X9 LF (GAUZE/BANDAGES/DRESSINGS) IMPLANT
BLADE SURG 11 STRL SS (BLADE) ×3 IMPLANT
BNDG CMPR 9X6 STRL LF SNTH (GAUZE/BANDAGES/DRESSINGS)
BNDG COHESIVE 4X5 TAN STRL (GAUZE/BANDAGES/DRESSINGS) ×2 IMPLANT
BNDG ESMARK 6X9 LF (GAUZE/BANDAGES/DRESSINGS)
CANISTER SUCT 3000ML PPV (MISCELLANEOUS) ×1 IMPLANT
CATH ACCU-VU SIZ PIG 5F 70CM (CATHETERS) ×2 IMPLANT
CATH ANGIO 5F BER2 65CM (CATHETERS) ×2 IMPLANT
CATH OMNI FLUSH .035X70CM (CATHETERS) ×3 IMPLANT
CLIP TI MEDIUM 24 (CLIP) IMPLANT
CLIP TI WIDE RED SMALL 24 (CLIP) IMPLANT
COVER DOME SNAP 22 D (MISCELLANEOUS) ×3 IMPLANT
COVER PROBE W GEL 5X96 (DRAPES) ×1 IMPLANT
COVER SURGICAL LIGHT HANDLE (MISCELLANEOUS) ×3 IMPLANT
DERMABOND ADVANCED (GAUZE/BANDAGES/DRESSINGS) ×2
DERMABOND ADVANCED .7 DNX12 (GAUZE/BANDAGES/DRESSINGS) ×1 IMPLANT
DRAIN CHANNEL 15F RND FF W/TCR (WOUND CARE) IMPLANT
DRAPE X-RAY CASS 24X20 (DRAPES) IMPLANT
DRSG TEGADERM 2-3/8X2-3/4 SM (GAUZE/BANDAGES/DRESSINGS) ×6 IMPLANT
ELECT REM PT RETURN 9FT ADLT (ELECTROSURGICAL)
ELECTRODE REM PT RTRN 9FT ADLT (ELECTROSURGICAL) IMPLANT
EVACUATOR SILICONE 100CC (DRAIN) IMPLANT
GLOVE BIO SURGEON STRL SZ7.5 (GLOVE) ×3 IMPLANT
GLOVE BIOGEL PI IND STRL 7.5 (GLOVE) IMPLANT
GLOVE BIOGEL PI INDICATOR 7.5 (GLOVE) ×2
GLOVE ECLIPSE 7.5 STRL STRAW (GLOVE) ×4 IMPLANT
GOWN STRL REUS W/ TWL LRG LVL3 (GOWN DISPOSABLE) ×2 IMPLANT
GOWN STRL REUS W/ TWL XL LVL3 (GOWN DISPOSABLE) ×1 IMPLANT
GOWN STRL REUS W/TWL LRG LVL3 (GOWN DISPOSABLE)
GOWN STRL REUS W/TWL XL LVL3 (GOWN DISPOSABLE) ×6
HEMOSTAT SNOW SURGICEL 2X4 (HEMOSTASIS) IMPLANT
KIT BASIN OR (CUSTOM PROCEDURE TRAY) ×3 IMPLANT
KIT ROOM TURNOVER OR (KITS) ×1 IMPLANT
MARKER GRAFT CORONARY BYPASS (MISCELLANEOUS) IMPLANT
NDL PERC 18GX7CM (NEEDLE) ×1 IMPLANT
NEEDLE PERC 18GX7CM (NEEDLE) ×3 IMPLANT
NS IRRIG 1000ML POUR BTL (IV SOLUTION) IMPLANT
PACK GENERAL/GYN (CUSTOM PROCEDURE TRAY) ×1 IMPLANT
PACK PERIPHERAL VASCULAR (CUSTOM PROCEDURE TRAY) IMPLANT
PAD ARMBOARD 7.5X6 YLW CONV (MISCELLANEOUS) ×6 IMPLANT
PADDING CAST ABS 6INX4YD NS (CAST SUPPLIES)
PADDING CAST ABS COTTON 6X4 NS (CAST SUPPLIES) IMPLANT
PENCIL BUTTON HOLSTER BLD 10FT (ELECTRODE) ×2 IMPLANT
SET COLLECT BLD 21X3/4 12 (NEEDLE) IMPLANT
SET MICROPUNCTURE 5F STIFF (MISCELLANEOUS) ×3 IMPLANT
SHEATH AVANTI 11CM 5FR (MISCELLANEOUS) ×5 IMPLANT
SHEATH AVANTI 11CM 8FR (MISCELLANEOUS) ×2 IMPLANT
STOPCOCK 4 WAY LG BORE MALE ST (IV SETS) IMPLANT
STOPCOCK MORSE 400PSI 3WAY (MISCELLANEOUS) ×5 IMPLANT
SUT ETHILON 3 0 PS 1 (SUTURE) IMPLANT
SUT MNCRL AB 4-0 PS2 18 (SUTURE) IMPLANT
SUT PROLENE 5 0 C 1 24 (SUTURE) IMPLANT
SUT PROLENE 6 0 BV (SUTURE) IMPLANT
SUT PROLENE 7 0 BV 1 (SUTURE) IMPLANT
SUT SILK 2 0 SH (SUTURE) IMPLANT
SUT SILK 3 0 (SUTURE)
SUT SILK 3-0 18XBRD TIE 12 (SUTURE) IMPLANT
SUT VIC AB 2-0 CT1 27 (SUTURE)
SUT VIC AB 2-0 CT1 TAPERPNT 27 (SUTURE) IMPLANT
SUT VIC AB 3-0 SH 27 (SUTURE)
SUT VIC AB 3-0 SH 27X BRD (SUTURE) IMPLANT
SYR 10ML LL (SYRINGE) ×3 IMPLANT
SYR 20CC LL (SYRINGE) ×3 IMPLANT
SYR 30ML LL (SYRINGE) ×3 IMPLANT
SYR MEDRAD MARK V 150ML (SYRINGE) ×3 IMPLANT
TRAY FOLEY W/METER SILVER 16FR (SET/KITS/TRAYS/PACK) IMPLANT
TUBING EXTENTION W/L.L. (IV SETS) IMPLANT
TUBING HIGH PRESSURE 120CM (CONNECTOR) ×5 IMPLANT
UNDERPAD 30X30 (UNDERPADS AND DIAPERS) IMPLANT
WATER STERILE IRR 1000ML POUR (IV SOLUTION) IMPLANT
WIRE BENTSON .035X145CM (WIRE) ×7 IMPLANT
WIRE MINI STICK MAX (SHEATH) ×2 IMPLANT

## 2016-07-24 NOTE — Progress Notes (Signed)
CT surgery p.m. Rounds  Patient resting comfortably after arteriograms Blood pressure 95/60-clonidine patch removed Patient with mildly distended abdomen nontender

## 2016-07-24 NOTE — Progress Notes (Signed)
07/24/2016 5:09 PM Nursing note Verbal order Dr. Donzetta Matters utilize right radial arterial line for blood pressure management. Orders enacted. Will continue to closely monitor patient.  Yahira Timberman, Arville Lime

## 2016-07-24 NOTE — Progress Notes (Signed)
PT Cancellation Note  Patient Details Name: Haley Graham MRN: [id number] DOB: Jun 26, 1955   Cancelled Treatment:    Reason Eval/Treat Not Completed: (P) Patient at procedure or test/unavailable PT will continue to follow and return as able.   Shaquandra Galano B. Migdalia Dk PT, DPT Acute Rehabilitation  [phone] Pager [phone]  [place] 07/24/2016, 3:33 PM

## 2016-07-24 NOTE — Anesthesia Postprocedure Evaluation (Signed)
Anesthesia Post Note  Patient: Haley Graham  Procedure(s) Performed: Procedure(s) (LRB): ARCH AND ABDOMINAL AORTA  ANGIOGRAM (N/A)     Patient location during evaluation: PACU Anesthesia Type: General Level of consciousness: awake and alert Pain management: pain level controlled Vital Signs Assessment: post-procedure vital signs reviewed and stable Respiratory status: spontaneous breathing, nonlabored ventilation, respiratory function stable and patient connected to nasal cannula oxygen Cardiovascular status: blood pressure returned to baseline and stable Postop Assessment: no signs of nausea or vomiting Anesthetic complications: no    Last Vitals:  Vitals:   07/24/16 1615 07/24/16 1622  BP: (!) 68/43   Pulse: 98 97  Resp: (!) 22 (!) 23  Temp:  36.6 C    Last Pain:  Vitals:   07/24/16 1200  TempSrc:   PainSc: 5                  Karelyn Brisby,W. EDMOND

## 2016-07-24 NOTE — Anesthesia Procedure Notes (Signed)
Procedure Name: Intubation Date/Time: 07/24/2016 1:35 PM Performed by: Candis Shine Pre-anesthesia Checklist: Patient identified, Emergency Drugs available, Suction available and Patient being monitored Patient Re-evaluated:Patient Re-evaluated prior to inductionOxygen Delivery Method: Circle System Utilized Preoxygenation: Pre-oxygenation with 100% oxygen Intubation Type: IV induction Ventilation: Mask ventilation without difficulty Laryngoscope Size: Glidescope and 3 Grade View: Grade I Tube type: Oral Tube size: 7.5 mm Number of attempts: 1 Airway Equipment and Method: Stylet Placement Confirmation: ETT inserted through vocal cords under direct vision,  positive ETCO2 and breath sounds checked- equal and bilateral Secured at: 22 cm Tube secured with: Tape Dental Injury: Teeth and Oropharynx as per pre-operative assessment  Difficulty Due To: Difficulty was anticipated and Difficult Airway- due to limited oral opening Comments: Elective glidescope intubation

## 2016-07-24 NOTE — H&P (View-Only) (Signed)
  Progress Note    07/24/2016 8:29 AM Day of Surgery  Subjective:  Persistent abdominal pain, no new complaints  Vitals:   07/24/16 0745 07/24/16 0800  BP:  (!) 96/51  Pulse:  100  Resp:  (!) 29  Temp: 98.6 F (37 C)     Physical Exam: Awake and alert, answers questions but not oriented Moves all 4 extremities but grossly weak Abdomen distended with mild ttp Palpable dp bilaterally  CBC    Component Value Date/Time   WBC 8.1 07/23/2016 1035   RBC 2.80 (L) 07/23/2016 1035   HGB 8.4 (L) 07/23/2016 1035   HCT 27.7 (L) 07/23/2016 1035   PLT 77 (L) 07/23/2016 1035   MCV 98.9 07/23/2016 1035   MCH 30.0 07/23/2016 1035   MCHC 30.3 07/23/2016 1035   RDW 16.8 (H) 07/23/2016 1035   LYMPHSABS 1.7 07/23/2016 1035   MONOABS 0.5 07/23/2016 1035   EOSABS 0.1 07/23/2016 1035   BASOSABS 0.2 (H) 07/23/2016 1035    BMET    Component Value Date/Time   NA 134 (L) 07/23/2016 0409   K 3.5 07/23/2016 0409   CL 107 07/23/2016 0409   CO2 22 07/23/2016 0409   GLUCOSE 103 (H) 07/23/2016 0409   BUN 32 (H) 07/23/2016 0409   CREATININE 1.14 (H) 07/23/2016 0409   CALCIUM 8.2 (L) 07/23/2016 0409   GFRNONAA 52 (L) 07/23/2016 0409   GFRAA 60 (L) 07/23/2016 0409    INR    Component Value Date/Time   INR 1.72 07/19/2016 0630     Intake/Output Summary (Last 24 hours) at 07/24/16 0829 Last data filed at 07/24/16 0800  Gross per 24 hour  Intake             3278 ml  Output                0 ml  Net             3278 ml     Assessment:  61 y.o. female is here tbad with persistent abdominal distension and failure to thrive  Plan: Proceed with aortogram with possible tevar today. Again discussed risks of death, stroke, mi, paralysis with husband. He understands this is a bad situation but she has failed non-operative management. To OR today.    Jennamarie Goings C. Donzetta Matters, MD [facility] Office: [phone] Pager: [phone]  07/24/2016 8:29 AM

## 2016-07-24 NOTE — Progress Notes (Signed)
Discussed patient's blood pressure with MD Prescott Gum on evening rounds, MD advised to keep clonidine patch off and to obtain an I-Stat hemoglobin.    Hemoglobin 8.2, discussed with MD Prescott Gum.  RN to continue to monitor patient.

## 2016-07-24 NOTE — Anesthesia Preprocedure Evaluation (Addendum)
Anesthesia Evaluation  Patient identified by MRN, date of birth, ID band Patient awake    Reviewed: Allergy & Precautions, H&P , NPO status , Patient's Chart, lab work & pertinent test results, reviewed documented beta blocker date and time   Airway Mallampati: III  TM Distance: >3 FB Neck ROM: Full  Mouth opening: Limited Mouth Opening  Dental no notable dental hx. (+) Teeth Intact, Dental Advisory Given   Pulmonary Current Smoker,    Pulmonary exam normal breath sounds clear to auscultation       Cardiovascular hypertension, Pt. on medications and Pt. on home beta blockers + Peripheral Vascular Disease   Rhythm:Regular Rate:Normal     Neuro/Psych Anxiety negative neurological ROS  negative psych ROS   GI/Hepatic Neg liver ROS, GERD  Medicated and Controlled,  Endo/Other  diabetes  Renal/GU negative Renal ROS  negative genitourinary   Musculoskeletal  (+) Arthritis , Osteoarthritis,    Abdominal   Peds  Hematology negative hematology ROS (+)   Anesthesia Other Findings   Reproductive/Obstetrics negative OB ROS                            Anesthesia Physical Anesthesia Plan  ASA: III  Anesthesia Plan: General   Post-op Pain Management:    Induction: Intravenous  PONV Risk Score and Plan: 3 and Ondansetron, Dexamethasone, Propofol and Midazolam  Airway Management Planned: Oral ETT  Additional Equipment: Arterial line, CVP and Ultrasound Guidance Line Placement  Intra-op Plan:   Post-operative Plan: Extubation in OR and Possible Post-op intubation/ventilation  Informed Consent: I have reviewed the patients History and Physical, chart, labs and discussed the procedure including the risks, benefits and alternatives for the proposed anesthesia with the patient or authorized representative who has indicated his/her understanding and acceptance.   Dental advisory given  Plan  Discussed with: CRNA  Anesthesia Plan Comments:        Anesthesia Quick Evaluation

## 2016-07-24 NOTE — Op Note (Signed)
    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 Donzetta Matters, MD Procedure Performed: 1.  US guided cannulation of bilateral common femoral arteries and left brachial artery 2.  Arch and abdominal aortogram  Indications:  61 year old female with a history of diabetes aortic dissection. She now has evidence of visceral malperfusion and is indicated for possible repair with thoracic endovascular stenting.  Findings: both common femoral arteries were percutaneous the cannulated in the false lumen. The brachial artery was cannulated in the true lumen and arch angiogram demonstrated the tear to be approximately 1-1/2 cm distal to the left subclavian artery. Vessel arteries are perfused as well as bilateral common iliac arteries although the visceral vessels appeared to be quite diminutive. After all she's were removed and pressure held there were palpable left radial and bilateral dorsalis pedis pulses at completion. Upon awakening from anesthesia the patient was noted to be neurologically at her baseline.   Procedure:  The patient was identified in the holding area and taken to the operating room wishes placed supine on the operating table and general endotracheal anesthesia was induced. She was then sterilely prepped and draped in the left arm bilateral lower extremities and timeout called. She is already on scheduled antibiotics. Offset was used to cannulate the left brachial artery with micropuncture needle and sheath was placed. Bentson was then used to exchange for 5 [place] sheath. We then used micropuncture needle to cannulate the right common femoral artery. We had trouble passing a Benson wire after micropuncture sheath was placed and so retrograde angiogram was performed which demonstrated a dissection plane. At this time the  micropuncture sheath was removed and pressure held for approximately 10 minutes. Same time we then cannulated the left common femoral artery with micropuncture needle and wire. A Benson wire within not pass as well through the micropuncture sheath. We were able to exchange for 5 French sheath. Retrograde antegrade and then demonstrated this to was in a false luminal plane. We then use a barrier catheter and Benson wire to get into the ascending arch from the left brachial sheath. We performed a retrograde injection with symmetry we are in a true lumen. We then performed arch angiogram with the above findings. Informed aortogram on the lateral view which demonstrated a diminutive visceral vessels. We then decided to terminate the procedure. Catheters were removed. She is reporting pressure held for 10 minutes. I completion of a palpable dorsalis pedis pulses bilaterally as well as a palpable radial artery on the left. Patient was then awakened from anesthesia having tolerated the procedure well without immediate complication.     Dionne Knoop C. Donzetta Matters, MD [facility] [place] [facility] Office: [phone] Pager: [phone]

## 2016-07-24 NOTE — Anesthesia Procedure Notes (Signed)
Central Venous Catheter Insertion Performed by: Roderic Palau, anesthesiologist Start/End6/22/2018 12:20 PM, 07/24/2016 12:30 PM Patient location: Pre-op. Preanesthetic checklist: patient identified, IV checked, site marked, risks and benefits discussed, surgical consent, monitors and equipment checked, pre-op evaluation, timeout performed and anesthesia consent Position: Trendelenburg Lidocaine 1% used for infiltration and patient sedated Hand hygiene performed , maximum sterile barriers used  and Seldinger technique used Catheter size: 8.5 Fr Total catheter length 10. Central line was placed.Sheath introducer Swan type:thermodilution PA Cath depth:50 Procedure performed using ultrasound guided technique. Ultrasound Notes:anatomy identified, needle tip was noted to be adjacent to the nerve/plexus identified, no ultrasound evidence of intravascular and/or intraneural injection and image(s) printed for medical record Attempts: 1 Following insertion, line sutured and dressing applied. Post procedure assessment: blood return through all ports, free fluid flow and no air  Patient tolerated the procedure well with no immediate complications.

## 2016-07-24 NOTE — Progress Notes (Signed)
  Progress Note    07/24/2016 8:29 AM Day of Surgery  Subjective:  Persistent abdominal pain, no new complaints  Vitals:   07/24/16 0745 07/24/16 0800  BP:  (!) 96/51  Pulse:  100  Resp:  (!) 29  Temp: 98.6 F (37 C)     Physical Exam: Awake and alert, answers questions but not oriented Moves all 4 extremities but grossly weak Abdomen distended with mild ttp Palpable dp bilaterally  CBC    Component Value Date/Time   WBC 8.1 07/23/2016 1035   RBC 2.80 (L) 07/23/2016 1035   HGB 8.4 (L) 07/23/2016 1035   HCT 27.7 (L) 07/23/2016 1035   PLT 77 (L) 07/23/2016 1035   MCV 98.9 07/23/2016 1035   MCH 30.0 07/23/2016 1035   MCHC 30.3 07/23/2016 1035   RDW 16.8 (H) 07/23/2016 1035   LYMPHSABS 1.7 07/23/2016 1035   MONOABS 0.5 07/23/2016 1035   EOSABS 0.1 07/23/2016 1035   BASOSABS 0.2 (H) 07/23/2016 1035    BMET    Component Value Date/Time   NA 134 (L) 07/23/2016 0409   K 3.5 07/23/2016 0409   CL 107 07/23/2016 0409   CO2 22 07/23/2016 0409   GLUCOSE 103 (H) 07/23/2016 0409   BUN 32 (H) 07/23/2016 0409   CREATININE 1.14 (H) 07/23/2016 0409   CALCIUM 8.2 (L) 07/23/2016 0409   GFRNONAA 52 (L) 07/23/2016 0409   GFRAA 60 (L) 07/23/2016 0409    INR    Component Value Date/Time   INR 1.72 07/19/2016 0630     Intake/Output Summary (Last 24 hours) at 07/24/16 0829 Last data filed at 07/24/16 0800  Gross per 24 hour  Intake             3278 ml  Output                0 ml  Net             3278 ml     Assessment:  61 y.o. female is here tbad with persistent abdominal distension and failure to thrive  Plan: Proceed with aortogram with possible tevar today. Again discussed risks of death, stroke, mi, paralysis with husband. He understands this is a bad situation but she has failed non-operative management. To OR today.    Teryn Boerema C. Donzetta Matters, MD [facility] [place] Office: [phone] Pager: [phone]  07/24/2016 8:29 AM

## 2016-07-24 NOTE — Progress Notes (Signed)
Patient sat up in chair for 1 hour.  At 0700 patient stated she had to use the bathroom.  RN assisted patient to [facility].  After patient finished, RN tried to encourage patient to get back in to the chair, patient refused.  Patient stated "I'm hurting too bad" and also repeatedly stated "I'm not walking".  RN assisted patient back to bed.

## 2016-07-24 NOTE — Interval H&P Note (Signed)
History and Physical Interval Note:  07/24/2016 1:06 PM  Haley Graham  has presented today for surgery, with the diagnosis of Thoracoabdominal Dissection I71.03  The various methods of treatment have been discussed with the patient and family. After consideration of risks, benefits and other options for treatment, the patient has consented to  Procedure(s): ARCH ANGIOGRAM (N/A) POSSIBLE THORACIC AORTIC ENDOVASCULAR STENT GRAFT (N/A) as a surgical intervention .  The patient's history has been reviewed, patient examined, no change in status, stable for surgery.  I have reviewed the patient's chart and labs.  Questions were answered to the patient's satisfaction.     Annamarie Major

## 2016-07-24 NOTE — Progress Notes (Signed)
PHARMACY - ADULT TOTAL PARENTERAL NUTRITION CONSULT NOTE   Pharmacy Consult:  TPN Indication:  Prolonged NPO status/high NG output   Patient Measurements: Height: 5\' 3"  (160 cm) Weight: 191 lb 9.3 oz (86.9 kg) IBW/kg (Calculated) : 52.4 TPN AdjBW (KG): 61 Body mass index is 33.94 kg/m. 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 Patient had a significant history of N/V for 2-4 weeks PTA but weight appears to have been maintained. Patient has had several NG tube replacement due to nausea and vomiting.  Pharmacy consulted to manage TPN since 06/23/16.    GI: persistent ileus and mesenteric ischemia. Prealbumin 10.4 >> 7.4, last BM 6/22.  PPI IV.  Aortogram with possible TEVAR today  Endo: DM on glipizide/metformin PTA - CBGs improved, significant SSI use while on steroids (SM 40mg  x 2 doses given 6/17) Insulin requirements in the past 24 hours: 14 units SSI, Lantus 40 units BID + 60 units in TPN  Lytes: Mild hyponatremia, borderline hypokalemia, hypomagnesemia - replaced 6/21.  Renal: SCr 1.14, BUN 32 - NS at 49ml/hr, UOP not documented, net +23L since admit - added furosemide  Pulm: pleural effusion, down to 3L [place] - Brovana, Pulmicort, PRN albuterol  Cards: Tachy - Stanford type B aortic dissection (stable) - BP controlled, - Lopressor, clonidine 0.3 mg patch, PRN labetalol.  Plans for surgery on Friday.  Hepatobil: AST/ALT wnl, tbili 1.5, INR 1.72.  TG elevated at 223.  Neuro: Celexa, Klonopin, PRN Fentanyl/Dialudid/Xanax/APAP - pain score 0-3  ID: Meropenem resumed (6/21 >> ) for fever/possible PNA, s/p 3d Cipro >> s/p 7d Vanc for E.faecalis UTI.  S/p meropenem (5/25-6/4) for PNA. C.diff negative.  Tm 101.4, WBC WNL. 6/21 BCx - pending  Best Practices: SCDs, CHG, MC TPN Access: PICC placed 06/21/16  TPN start date: 06/23/16  Nutritional Goals (per RD recommendation on 6/18): 2000-2200 kCal and  110-120 gm protein  New goal if TG allows: Clinimix E 5/15 at 100 ml/hr + 20% ILE at 20 ml/hr x 12 hrs (= 2184 kCal and 120 gm protein per day)  Current Nutrition:   TPN NPO for surgery Boost BID   Plan:  - Increase Cinimix E 5/15 to 100 ml/hr and 20% ILE at 20 ml/hr x 12 hrs. TPN provides 2184 kCal and 120gm of protein per day, meeting > 100% of total needs.   - Daily multivitamin in TPN - Trace elements in TPN every other day d/t shortage, next 6/23 - Continue TCTS SSI Q4H + Lantus 40 units BID and 60 units in TPN,  F/U CBGs - Repeat TG on Mon, CMP in am  Thank you for allowing pharmacy to be part of this patients care team.  Renold Genta, PharmD, BCPS Clinical Pharmacist Phone for today - [place] - [phone] 07/24/2016 7:43 AM

## 2016-07-24 NOTE — Progress Notes (Signed)
07/24/2016  10:18 AM Dr. Donzetta Matters paged and made aware need for consent for surgery orders. MD to place. Will continue to closely monitor patient.  Karyme Mcconathy, Arville Lime

## 2016-07-24 NOTE — Progress Notes (Signed)
07/24/2016 1200 PT. Transported with RN at bedside to short stay. Pt. Tolerated transfer well. VSS. RN, Ria Comment at bedside to receive patient.  Deyon Chizek, Arville Lime

## 2016-07-24 NOTE — Transfer of Care (Signed)
Immediate Anesthesia Transfer of Care Note  Patient: Haley Graham  Procedure(s) Performed: Procedure(s): ARCH AND ABDOMINAL AORTA  ANGIOGRAM (N/A)  Patient Location: PACU  Anesthesia Type:General  Level of Consciousness: awake, alert  and oriented  Airway & Oxygen Therapy: Patient Spontanous Breathing and Patient connected to nasal cannula oxygen  Post-op Assessment: Report given to RN and Post -op Vital signs reviewed and stable  Post vital signs: Reviewed and stable  Last Vitals:  Vitals:   07/24/16 1100 07/24/16 1200  BP: (!) 115/44 (!) 100/53  Pulse: 97 97  Resp: (!) 23 (!) 30  Temp:      Last Pain:  Vitals:   07/24/16 1200  TempSrc:   PainSc: 5       Patients Stated Pain Goal: 2 (81/38/87 1959)  Complications: No apparent anesthesia complications

## 2016-07-25 ENCOUNTER — Encounter (HOSPITAL_COMMUNITY): Payer: Self-pay | Admitting: Vascular Surgery

## 2016-07-25 ENCOUNTER — Inpatient Hospital Stay (HOSPITAL_COMMUNITY): Payer: BLUE CROSS/BLUE SHIELD

## 2016-07-25 LAB — COMPREHENSIVE METABOLIC PANEL
ALT: 34 U/L (ref 14–54)
ANION GAP: 10 (ref 5–15)
AST: 22 U/L (ref 15–41)
Albumin: 1.1 g/dL — ABNORMAL LOW (ref 3.5–5.0)
Alkaline Phosphatase: 78 U/L (ref 38–126)
BUN: 49 mg/dL — ABNORMAL HIGH (ref 6–20)
CHLORIDE: 106 mmol/L (ref 101–111)
CO2: 16 mmol/L — AB (ref 22–32)
Calcium: 7.4 mg/dL — ABNORMAL LOW (ref 8.9–10.3)
Creatinine, Ser: 1.47 mg/dL — ABNORMAL HIGH (ref 0.44–1.00)
GFR calc Af Amer: 44 mL/min — ABNORMAL LOW (ref >60)
GFR calc non Af Amer: 38 mL/min — ABNORMAL LOW (ref >60)
Glucose, Bld: 188 mg/dL — ABNORMAL HIGH (ref 65–99)
Potassium: 3.7 mmol/L (ref 3.5–5.1)
Sodium: 132 mmol/L — ABNORMAL LOW (ref 135–145)
Total Bilirubin: 1.1 mg/dL (ref 0.3–1.2)
Total Protein: 4.2 g/dL — ABNORMAL LOW (ref 6.5–8.1)

## 2016-07-25 LAB — GLUCOSE, CAPILLARY
Glucose-Capillary: 174 mg/dL — ABNORMAL HIGH (ref 65–99)
Glucose-Capillary: 181 mg/dL — ABNORMAL HIGH (ref 65–99)
Glucose-Capillary: 191 mg/dL — ABNORMAL HIGH (ref 65–99)
Glucose-Capillary: 197 mg/dL — ABNORMAL HIGH (ref 65–99)
Glucose-Capillary: 198 mg/dL — ABNORMAL HIGH (ref 65–99)
Glucose-Capillary: 190 mg/dL — ABNORMAL HIGH (ref 65–99)

## 2016-07-25 LAB — URINE CULTURE: Culture: NO GROWTH

## 2016-07-25 LAB — MAGNESIUM: Magnesium: 1.7 mg/dL (ref 1.7–2.4)

## 2016-07-25 MED ORDER — ALPRAZOLAM 0.5 MG PO TABS: 0.5000 mg | ORAL_TABLET | Freq: Every day | ORAL | Status: DC

## 2016-07-25 MED ORDER — ALPRAZOLAM 0.25 MG PO TABS: 0.2500 mg | ORAL_TABLET | Freq: Every day | ORAL | Status: DC | PRN

## 2016-07-25 MED ORDER — FAT EMULSION 20 % IV EMUL
240.0000 mL | INTRAVENOUS | Status: AC
  Administered 2016-07-25: 240 mL via INTRAVENOUS
  Filled 2016-07-25: qty 250

## 2016-07-25 MED ORDER — POTASSIUM CHLORIDE 10 MEQ/50ML IV SOLN
10.0000 meq | INTRAVENOUS | Status: AC
  Administered 2016-07-25 (×2): 10 meq via INTRAVENOUS
  Filled 2016-07-25 (×2): qty 50

## 2016-07-25 MED ORDER — METOPROLOL TARTRATE 25 MG PO TABS
25.0000 mg | ORAL_TABLET | Freq: Two times a day (BID) | ORAL | Status: DC
  Administered 2016-07-25 – 2016-08-19 (×50): 25 mg via ORAL
  Filled 2016-07-25 (×50): qty 1

## 2016-07-25 MED ORDER — ALPRAZOLAM 0.25 MG PO TABS
0.2500 mg | ORAL_TABLET | Freq: Two times a day (BID) | ORAL | Status: DC | PRN
  Administered 2016-07-29 – 2016-08-06 (×12): 0.25 mg via ORAL
  Filled 2016-07-25 (×13): qty 1

## 2016-07-25 MED ORDER — SODIUM CHLORIDE 0.9 % IV SOLN
1.0000 g | Freq: Two times a day (BID) | INTRAVENOUS | Status: DC
  Administered 2016-07-25 – 2016-07-26 (×3): 1 g via INTRAVENOUS
  Filled 2016-07-25 (×4): qty 1

## 2016-07-25 MED ORDER — MAGNESIUM SULFATE 2 GM/50ML IV SOLN
2.0000 g | Freq: Once | INTRAVENOUS | Status: AC
  Administered 2016-07-25: 2 g via INTRAVENOUS
  Filled 2016-07-25: qty 50

## 2016-07-25 MED ORDER — TRACE MINERALS CR-CU-MN-SE-ZN 10-1000-500-60 MCG/ML IV SOLN
INTRAVENOUS | Status: AC
  Administered 2016-07-25: 18:00:00 via INTRAVENOUS
  Filled 2016-07-25: qty 2400

## 2016-07-25 NOTE — Progress Notes (Signed)
PHARMACY - ADULT TOTAL PARENTERAL NUTRITION CONSULT NOTE   Pharmacy Consult:  TPN Indication:  Prolonged NPO status/high NG output   Patient Measurements: Height: 5\' 3"  (160 cm) Weight: 197 lb 5 oz (89.5 kg) IBW/kg (Calculated) : 52.4 TPN AdjBW (KG): 61.7 Body mass index is 34.95 kg/m. 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 Patient had a significant history of N/V for 2-4 weeks PTA but weight appears to have been maintained. Patient has had several NG tube replacement due to nausea and vomiting.  Pharmacy consulted to manage TPN since 06/23/16.    GI: persistent ileus and mesenteric ischemia. Prealbumin 10.4 >> 7.4, last BM 6/22.  PPI IV.  Had arch/abdominal aortogram and US guided cannulation of common femoral arteries and left brachial artery 6/22  Endo: DM on glipizide/metformin PTA - CBGs 124-228, significant SSI use while on steroids (SM 40mg  x 2 doses given 6/17) Insulin requirements in the past 24 hours: 24 units SSI + 60 units in TPN  Lytes: K 3.7 (goal >= 4 w/ ileus), Mg 1.7 (goal >=2 with ileus)  Renal: SCr 1.47 >>1.14, BUN 49 - NS at 38ml/hr, UOP 0.7 ml/kg/hr, net +24L since admit - added furosemide  Pulm: pleural effusion, down to 3L [place] - Brovana, Pulmicort, PRN albuterol  Cards: Tachy - Stanford type B aortic dissection (stable) - BP controlled, - Lopressor, IV Lasix PRN labetalol. Clonidine patch removed for low BP post-op but remains on MAR  Hepatobil: AST/ALT wnl, tbili 1.5, INR 1.72.  TG elevated at 223.  Neuro: Celexa, Klonopin, PRN Fentanyl/Dialudid/Xanax/APAP - pain score 0-3  ID: Meropenem resumed (6/21 >> ) for fever/possible PNA, s/p 3d Cipro >> s/p 7d Vanc for E.faecalis UTI.  S/p meropenem (5/25-6/4) for PNA. C.diff negative.  Afeb, WBC WNL. 6/21 BCx - pending  Best Practices: SCDs, CHG, MC TPN Access: PICC placed 06/21/16  TPN start date: 06/23/16  Nutritional Goals (per  RD recommendation on 6/18): 2000-2200 kCal and 110-120 gm protein  New goal if TG allows: Clinimix E 5/15 at 100 ml/hr + 20% ILE at 20 ml/hr x 12 hrs (= 2184 kCal and 120 gm protein per day)  Current Nutrition:   TPN Boost BID although NPO currently  Plan:  - Increase Cinimix E 5/15 to 100 ml/hr and 20% ILE at 20 ml/hr x 12 hrs. TPN provides 2184 kCal and 120 gm of protein per day, meeting > 100% of total needs.   - Daily multivitamin in TPN - Trace elements in TPN every other day d/t shortage, due today - Continue TCTS SSI Q4H + 60 units in TPN,  F/U CBGs - may need to add back Lantus when not NPO - KCl 10 meq IV q1h x 2 doses - Mg 2 g IV x 1 dose - Repeat TG on Mon  Thank you for allowing pharmacy to be part of this patients care team.  Renold Genta, PharmD, BCPS Clinical Pharmacist Phone for today - [place] - [phone] 07/25/2016 8:04 AM

## 2016-07-25 NOTE — Progress Notes (Signed)
1 Day Post-Op Procedure(s) (LRB): ARCH AND ABDOMINAL AORTA  ANGIOGRAM (N/A) Subjective: TEVAR attempt to cover thoracic aortic tear not done due to inability to access false lumen KUB with stable ileus Renal fx slight worse after contrast Objective: Vital signs in last 24 hours: Temp:  [97.7 F (36.5 C)-99.1 F (37.3 C)] 99.1 F (37.3 C) (06/23 0800) Pulse Rate:  [92-111] 108 (06/23 0800) Cardiac Rhythm: Sinus tachycardia (06/23 0800) Resp:  [21-40] 27 (06/23 0800) BP: (64-115)/(40-90) 105/90 (06/23 0800) SpO2:  [94 %-100 %] 96 % (06/23 0800) Arterial Line BP: (90-145)/(33-64) 117/40 (06/23 0800) Weight:  [197 lb 5 oz (89.5 kg)] 197 lb 5 oz (89.5 kg) (06/23 0500)  Hemodynamic parameters for last 24 hours:  nsr  Intake/Output from previous day: 06/22 0701 - 06/23 0700 In: 3139.3 [I.V.:2939.3; IV Piggyback:200] Out: 0511 [Urine:1565; Blood:25] Intake/Output this shift: Total I/O In: 110 [I.V.:110] Out: -        Exam    General- alert and comfortable   Lungs- clear without rales, wheezes   Cor- regular rate and rhythm, no murmur , gallop   Abdomen- soft distended without guarding   Extremities - warm, non-tender, minimal edema   Neuro- oriented, appropriate, no focal weakness   Lab Results:  Recent Labs  07/23/16 1035 07/24/16 2120  WBC 8.1  --   HGB 8.4* 8.2*  HCT 27.7* 24.0*  PLT 77*  --    BMET:  Recent Labs  07/23/16 0409 07/24/16 2120 07/25/16 0322  NA 134* 136 132*  K 3.5 3.5 3.7  CL 107  --  106  CO2 22  --  16*  GLUCOSE 103* 193* 188*  BUN 32*  --  49*  CREATININE 1.14*  --  1.47*  CALCIUM 8.2*  --  7.4*    PT/INR: No results for input(s): LABPROT, INR in the last 72 hours. ABG    Component Value Date/Time   PHART 7.430 06/28/2016 1615   HCO3 20.7 06/28/2016 1615   TCO2 22 06/28/2016 1615   ACIDBASEDEF 3.0 (H) 06/28/2016 1615   O2SAT 93.0 06/28/2016 1615   CBG (last 3)   Recent Labs  07/24/16 2342 07/25/16 0332 07/25/16 0834   GLUCAP 202* 174* 181*    Assessment/Plan: S/P Procedure(s) (LRB): ARCH AND ABDOMINAL AORTA  ANGIOGRAM (N/A) Cont TNA finish course of Meropenum CXR in am  Watch anemia, renal panel- stop lasix  LOS: 35 days    Haley Graham 07/25/2016

## 2016-07-25 NOTE — Progress Notes (Signed)
CT Surgery  Appears depressed this pm  Weaker from general anesthesia yesterday No emesis

## 2016-07-25 NOTE — Progress Notes (Signed)
  Progress Note    07/25/2016 10:02 AM 1 Day Post-Op  Subjective:  More alert this a.m, wants to drink  Vitals:   07/25/16 0700 07/25/16 0800  BP:  105/90  Pulse: (!) 109 (!) 108  Resp: (!) 30 (!) 27  Temp:  99.1 F (37.3 C)    Physical Exam: Awake and alert this a.m. Left radial artery palpable Palpable bilateral dp  CBC    Component Value Date/Time   WBC 8.1 07/23/2016 1035   RBC 2.80 (L) 07/23/2016 1035   HGB 8.2 (L) 07/24/2016 2120   HCT 24.0 (L) 07/24/2016 2120   PLT 77 (L) 07/23/2016 1035   MCV 98.9 07/23/2016 1035   MCH 30.0 07/23/2016 1035   MCHC 30.3 07/23/2016 1035   RDW 16.8 (H) 07/23/2016 1035   LYMPHSABS 1.7 07/23/2016 1035   MONOABS 0.5 07/23/2016 1035   EOSABS 0.1 07/23/2016 1035   BASOSABS 0.2 (H) 07/23/2016 1035    BMET    Component Value Date/Time   NA 132 (L) 07/25/2016 0322   K 3.7 07/25/2016 0322   CL 106 07/25/2016 0322   CO2 16 (L) 07/25/2016 0322   GLUCOSE 188 (H) 07/25/2016 0322   BUN 49 (H) 07/25/2016 0322   CREATININE 1.47 (H) 07/25/2016 0322   CALCIUM 7.4 (L) 07/25/2016 0322   GFRNONAA 38 (L) 07/25/2016 0322   GFRAA 44 (L) 07/25/2016 0322    INR    Component Value Date/Time   INR 1.72 07/19/2016 0630     Intake/Output Summary (Last 24 hours) at 07/25/16 1002 Last data filed at 07/25/16 0800  Gross per 24 hour  Intake          2919.33 ml  Output             1190 ml  Net          1729.33 ml     Assessment:  61 y.o. female is s/p attempted tevar aborted due to inability to access true lumen from below. Cr elevated today to 1.47  Plan: Lasix has been held for renal dysfunction  Should be ok for sips Pending transfer to tertiary institution   [place]. Donzetta Matters, MD [facility] [place] Office: [phone] Pager: [phone]  07/25/2016 10:02 AM

## 2016-07-25 NOTE — Plan of Care (Signed)
Problem: Coping: Goal: Ability to adjust to condition or change in health will improve Outcome: Progressing Patient is willing to speak to a chaplain from home

## 2016-07-26 ENCOUNTER — Inpatient Hospital Stay (HOSPITAL_COMMUNITY): Payer: BLUE CROSS/BLUE SHIELD

## 2016-07-26 LAB — GLUCOSE, CAPILLARY
Glucose-Capillary: 191 mg/dL — ABNORMAL HIGH (ref 65–99)
Glucose-Capillary: 208 mg/dL — ABNORMAL HIGH (ref 65–99)
Glucose-Capillary: 194 mg/dL — ABNORMAL HIGH (ref 65–99)
Glucose-Capillary: 227 mg/dL — ABNORMAL HIGH (ref 65–99)
Glucose-Capillary: 222 mg/dL — ABNORMAL HIGH (ref 65–99)
Glucose-Capillary: 182 mg/dL — ABNORMAL HIGH (ref 65–99)

## 2016-07-26 LAB — BASIC METABOLIC PANEL
Anion gap: 4 — ABNORMAL LOW (ref 5–15)
BUN: 49 mg/dL — ABNORMAL HIGH (ref 6–20)
CO2: 23 mmol/L (ref 22–32)
Calcium: 7.5 mg/dL — ABNORMAL LOW (ref 8.9–10.3)
Chloride: 106 mmol/L (ref 101–111)
Creatinine, Ser: 1.36 mg/dL — ABNORMAL HIGH (ref 0.44–1.00)
GFR calc Af Amer: 48 mL/min — ABNORMAL LOW (ref >60)
GFR calc non Af Amer: 42 mL/min — ABNORMAL LOW (ref >60)
Glucose, Bld: 195 mg/dL — ABNORMAL HIGH (ref 65–99)
Potassium: 3.6 mmol/L (ref 3.5–5.1)
Sodium: 133 mmol/L — ABNORMAL LOW (ref 135–145)

## 2016-07-26 LAB — CBC
HCT: 27 % — ABNORMAL LOW (ref 36.0–46.0)
Hemoglobin: 8.7 g/dL — ABNORMAL LOW (ref 12.0–15.0)
MCH: 31 pg (ref 26.0–34.0)
MCHC: 32.2 g/dL (ref 30.0–36.0)
MCV: 96.1 fL (ref 78.0–100.0)
Platelets: 102 10*3/uL — ABNORMAL LOW (ref 150–400)
RBC: 2.81 MIL/uL — ABNORMAL LOW (ref 3.87–5.11)
RDW: 15.7 % — ABNORMAL HIGH (ref 11.5–15.5)
WBC: 11.3 10*3/uL — ABNORMAL HIGH (ref 4.0–10.5)

## 2016-07-26 MED ORDER — CLINIMIX E/DEXTROSE (5/15) 5 % IV SOLN
INTRAVENOUS | Status: AC
  Administered 2016-07-26: 18:00:00 via INTRAVENOUS
  Filled 2016-07-26: qty 2400

## 2016-07-26 MED ORDER — FAT EMULSION 20 % IV EMUL
240.0000 mL | INTRAVENOUS | Status: AC
  Administered 2016-07-26: 240 mL via INTRAVENOUS
  Filled 2016-07-26: qty 250

## 2016-07-26 MED ORDER — MUPIROCIN 2 % EX OINT
TOPICAL_OINTMENT | CUTANEOUS | Status: AC
  Filled 2016-07-26: qty 22

## 2016-07-26 MED ORDER — POTASSIUM CHLORIDE 10 MEQ/50ML IV SOLN
10.0000 meq | INTRAVENOUS | Status: AC
  Administered 2016-07-26 (×3): 10 meq via INTRAVENOUS
  Filled 2016-07-26 (×3): qty 50

## 2016-07-26 MED ORDER — FUROSEMIDE 10 MG/ML IJ SOLN
40.0000 mg | Freq: Every day | INTRAMUSCULAR | Status: DC
  Administered 2016-07-26: 40 mg via INTRAVENOUS
  Filled 2016-07-26: qty 4

## 2016-07-26 MED ORDER — PANTOPRAZOLE SODIUM 40 MG PO TBEC
40.0000 mg | DELAYED_RELEASE_TABLET | Freq: Every day | ORAL | Status: DC
  Administered 2016-07-26 – 2016-08-04 (×10): 40 mg via ORAL
  Filled 2016-07-26 (×10): qty 1

## 2016-07-26 MED ORDER — INSULIN ASPART 100 UNIT/ML ~~LOC~~ SOLN
0.0000 [IU] | Freq: Four times a day (QID) | SUBCUTANEOUS | Status: DC
  Administered 2016-07-26: 4 [IU] via SUBCUTANEOUS
  Administered 2016-07-26: 8 [IU] via SUBCUTANEOUS
  Administered 2016-07-26: 2 [IU] via SUBCUTANEOUS
  Administered 2016-07-27: 4 [IU] via SUBCUTANEOUS
  Administered 2016-07-27: 8 [IU] via SUBCUTANEOUS
  Administered 2016-07-27 – 2016-07-28 (×4): 4 [IU] via SUBCUTANEOUS
  Administered 2016-07-28 – 2016-07-31 (×12): 2 [IU] via SUBCUTANEOUS
  Administered 2016-08-01 (×3): 8 [IU] via SUBCUTANEOUS
  Administered 2016-08-01: 12 [IU] via SUBCUTANEOUS
  Administered 2016-08-02 – 2016-08-03 (×6): 2 [IU] via SUBCUTANEOUS
  Administered 2016-08-04: 4 [IU] via SUBCUTANEOUS
  Administered 2016-08-04 – 2016-08-05 (×3): 2 [IU] via SUBCUTANEOUS
  Administered 2016-08-05 (×2): 4 [IU] via SUBCUTANEOUS
  Administered 2016-08-05: 2 [IU] via SUBCUTANEOUS
  Administered 2016-08-05: 4 [IU] via SUBCUTANEOUS
  Administered 2016-08-07: 8 [IU] via SUBCUTANEOUS
  Administered 2016-08-07 (×2): 2 [IU] via SUBCUTANEOUS
  Administered 2016-08-07: 12 [IU] via SUBCUTANEOUS
  Administered 2016-08-07: 4 [IU] via SUBCUTANEOUS
  Administered 2016-08-09 – 2016-08-11 (×5): 2 [IU] via SUBCUTANEOUS
  Administered 2016-08-11: 4 [IU] via SUBCUTANEOUS
  Administered 2016-08-12 – 2016-08-18 (×4): 2 [IU] via SUBCUTANEOUS
  Administered 2016-08-19: 4 [IU] via SUBCUTANEOUS

## 2016-07-26 NOTE — Progress Notes (Signed)
  Progress Note    07/26/2016 9:56 AM 2 Days Post-Op  Subjective:  Out of bed in chair this a.m. Asking about clear liquid tray  Vitals:   07/26/16 0600 07/26/16 0700  BP: (!) 111/39   Pulse: (!) 106 (!) 107  Resp: (!) 28 (!) 31  Temp:  98.2 F (36.8 C)    Physical Exam: Awake and alert Non labored respirations Abdomen is distended, mild ttp Palpable bilateral radial and dp pulses Groins are soft without hematoma  CBC    Component Value Date/Time   WBC 11.3 (H) 07/26/2016 0415   RBC 2.81 (L) 07/26/2016 0415   HGB 8.7 (L) 07/26/2016 0415   HCT 27.0 (L) 07/26/2016 0415   PLT 102 (L) 07/26/2016 0415   MCV 96.1 07/26/2016 0415   MCH 31.0 07/26/2016 0415   MCHC 32.2 07/26/2016 0415   RDW 15.7 (H) 07/26/2016 0415   LYMPHSABS 1.7 07/23/2016 1035   MONOABS 0.5 07/23/2016 1035   EOSABS 0.1 07/23/2016 1035   BASOSABS 0.2 (H) 07/23/2016 1035    BMET    Component Value Date/Time   NA 133 (L) 07/26/2016 0415   K 3.6 07/26/2016 0415   CL 106 07/26/2016 0415   CO2 23 07/26/2016 0415   GLUCOSE 195 (H) 07/26/2016 0415   BUN 49 (H) 07/26/2016 0415   CREATININE 1.36 (H) 07/26/2016 0415   CALCIUM 7.5 (L) 07/26/2016 0415   GFRNONAA 42 (L) 07/26/2016 0415   GFRAA 48 (L) 07/26/2016 0415    INR    Component Value Date/Time   INR 1.72 07/19/2016 0630     Intake/Output Summary (Last 24 hours) at 07/26/16 0956 Last data filed at 07/26/16 0800  Gross per 24 hour  Intake             2866 ml  Output             1975 ml  Net              891 ml     Assessment:  61 y.o. female is s/p attempted tevar aborted due to inability to access true lumen from below. Cr improving with hydration  Plan: Diet per primary Pending transfer to tertiary institution  [place]. Donzetta Matters, MD [facility] [place] Office: [phone] Pager: [phone]  07/26/2016 9:56 AM

## 2016-07-26 NOTE — Plan of Care (Signed)
Problem: Skin Integrity: Goal: Risk for impaired skin integrity will decrease Outcome: Progressing Able to turn on own in bed; chair and ambulate x4  Problem: Activity: Goal: Risk for activity intolerance will decrease Outcome: Progressing Chair and ambulate x4  Problem: Fluid Volume: Goal: Ability to maintain a balanced intake and output will improve Outcome: Progressing Lasix; diuresing  Problem: Nutrition: Goal: Adequate nutrition will be maintained Outcome: Progressing Started on clear liquids w/o nausea  Problem: Bowel/Gastric: Goal: Will not experience complications related to bowel motility Outcome: Progressing Stool has thickened from type 7 to type 6

## 2016-07-26 NOTE — Progress Notes (Signed)
PHARMACY - ADULT TOTAL PARENTERAL NUTRITION CONSULT NOTE   Pharmacy Consult:  TPN Indication:  Prolonged NPO status/high NG output   Patient Measurements: Height: 5\' 3"  (160 cm) Weight: 197 lb 12 oz (89.7 kg) IBW/kg (Calculated) : 52.4 TPN AdjBW (KG): 61.7 Body mass index is 35.03 kg/m. 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 Patient had a significant history of N/V for 2-4 weeks PTA but weight appears to have been maintained. Patient has had several NG tube replacement due to nausea and vomiting.  Pharmacy consulted to manage TPN since 06/23/16.    GI: persistent ileus and mesenteric ischemia. Prealbumin 10.4 >> 7.4, last BM 6/23.  PPI.  Had arch/abdominal aortogram and unsuccessful TEVAR d/t inability to access true lumen 6/22.  Endo: DM on glipizide/metformin PTA - CBGs 181-208, significant SSI use while on steroids (SM 40mg  x 2 doses given 6/17) Insulin requirements in the past 24 hours: 28 units SSI + 60 units in TPN  Lytes: K 3.6 - 3 runs ordered per TCTS protocol (goal >= 4 w/ ileus), Mg 1.7 (goal >=2 with ileus)  Renal: SCr down to 1.36, BUN 49 - NS at 73ml/hr, UOP 0.9 ml/kg/hr, net +24L since admit - added furosemide  Pulm: pleural effusion, down to 1L [place] - Brovana, Pulmicort, PRN albuterol  Cards: Tachy - Stanford type B aortic dissection (stable) - BP controlled, - Lopressor, IV Lasix PRN labetalol. Clonidine patch removed for low BP post-op but remains on MAR  Hepatobil: AST/ALT wnl, tbili 1.5, INR 1.72.  TG elevated at 223.  Neuro: Celexa, Klonopin, PRN Fentanyl/Dialudid/Xanax/APAP - pain score 0-5  ID: Meropenem resumed (6/21 >> ) for fever/possible PNA, s/p 3d Cipro >> s/p 7d Vanc for E.faecalis UTI.  S/p meropenem (5/25-6/4) for PNA. C.diff negative.  Afeb, WBC 11.3. 6/21 BCx - pending  Best Practices: SCDs, CHG, MC TPN Access: PICC placed 06/21/16  TPN start date:  06/23/16  Nutritional Goals (per RD recommendation on 6/18): 2000-2200 kCal per day 110-120 gm protein per day  New goal if TG allows: Clinimix E 5/15 at 100 ml/hr + 20% ILE at 20 ml/hr x 12 hrs (= 2184 kCal and 120 gm protein per day)  Current Nutrition:   TPN Boost BID although NPO exc sips with medication Clear liquids  Plan:  - Increase Cinimix E 5/15 to 100 ml/hr and 20% ILE at 20 ml/hr x 12 hrs. TPN provides 2184 kCal and 120 gm of protein per day, meeting > 100% of total needs.   - Daily multivitamin in TPN - Trace elements in TPN every other day d/t shortage, due 6/25 - Continue TCTS SSI Q4H + increase to 74 units in TPN,  F/U CBGs - consider adding back Lantus which was discontinued after surgery - F/U tolerability to clear liquids - Repeat TG on Mon - Transfer to tertiary hospital pending  Thank you for allowing pharmacy to be part of this patients care team.  Renold Genta, PharmD, BCPS Clinical Pharmacist Phone for today - [place] - [phone] 07/26/2016 7:31 AM

## 2016-07-26 NOTE — Progress Notes (Signed)
CT surgery p.m. Rounds  Overall improvement today Walking hallway twice Tolerating clear liquids Blood pressure stable Excellent diuresis

## 2016-07-26 NOTE — Progress Notes (Signed)
2 Days Post-Op Procedure(s) (LRB): ARCH AND ABDOMINAL AORTA  ANGIOGRAM (N/A) Subjective: Better today after gen anesthesia 2 days ago Walked in hall, + BM Improved appetite- cl liqs started nsr Creatinine better Objective: Vital signs in last 24 hours: Temp:  [97.7 F (36.5 C)-98.9 F (37.2 C)] 98.2 F (36.8 C) (06/24 0700) Pulse Rate:  [102-110] 107 (06/24 0700) Cardiac Rhythm: Sinus tachycardia (06/24 0800) Resp:  [22-36] 31 (06/24 0700) BP: (94-117)/(34-65) 111/39 (06/24 0600) SpO2:  [93 %-99 %] 94 % (06/24 0829) Weight:  [197 lb 12 oz (89.7 kg)] 197 lb 12 oz (89.7 kg) (06/24 0500)  Hemodynamic parameters for last 24 hours:  afebrile  Intake/Output from previous day: 06/23 0701 - 06/24 0700 In: 2696 [I.V.:2496; IV Piggyback:200] Out: 1975 [Urine:1975] Intake/Output this shift: Total I/O In: 440 [I.V.:440] Out: -        Exam    General- alert and comfortable   Lungs- clear without rales, wheezes   Cor- regular rate and rhythm, no murmur , gallop   Abdomen- soft, non-tender   Extremities - warm, non-tender, minimal edema   Neuro- oriented, appropriate, no focal weakness   Lab Results:  Recent Labs  07/24/16 2120 07/26/16 0415  WBC  --  11.3*  HGB 8.2* 8.7*  HCT 24.0* 27.0*  PLT  --  102*   BMET:  Recent Labs  07/25/16 0322 07/26/16 0415  NA 132* 133*  K 3.7 3.6  CL 106 106  CO2 16* 23  GLUCOSE 188* 195*  BUN 49* 49*  CREATININE 1.47* 1.36*  CALCIUM 7.4* 7.5*    PT/INR: No results for input(s): LABPROT, INR in the last 72 hours. ABG    Component Value Date/Time   PHART 7.430 06/28/2016 1615   HCO3 20.7 06/28/2016 1615   TCO2 22 06/28/2016 1615   ACIDBASEDEF 3.0 (H) 06/28/2016 1615   O2SAT 93.0 06/28/2016 1615   CBG (last 3)   Recent Labs  07/25/16 2317 07/26/16 0316 07/26/16 0820  GLUCAP 190* 208* 194*    Assessment/Plan: S/P Procedure(s) (LRB): ARCH AND ABDOMINAL AORTA  ANGIOGRAM (N/A) Cont TNA, lasix, antihypertensives and  complete course of Meropenum Will contact [facility] for opinion re: TEVAR- send discs of recent arteriogram and CTA mon   LOS: 36 days    [name] 07/26/2016

## 2016-07-27 DIAGNOSIS — I7102 Dissection of abdominal aorta: Secondary | ICD-10-CM

## 2016-07-27 LAB — TYPE AND SCREEN
ABO/RH(D): O POS
Antibody Screen: POSITIVE
DAT, IgG: NEGATIVE
Unit division: 0
Unit division: 0
Unit division: 0
Unit division: 0
Unit division: 0
Unit division: 0

## 2016-07-27 LAB — COMPREHENSIVE METABOLIC PANEL
ALK PHOS: 102 U/L (ref 38–126)
ALT: 28 U/L (ref 14–54)
ANION GAP: 7 (ref 5–15)
AST: 21 U/L (ref 15–41)
Albumin: 1.1 g/dL — ABNORMAL LOW (ref 3.5–5.0)
BUN: 48 mg/dL — ABNORMAL HIGH (ref 6–20)
CALCIUM: 7.6 mg/dL — AB (ref 8.9–10.3)
CO2: 20 mmol/L — ABNORMAL LOW (ref 22–32)
CREATININE: 1.15 mg/dL — AB (ref 0.44–1.00)
Chloride: 105 mmol/L (ref 101–111)
GFR, EST AFRICAN AMERICAN: 59 mL/min — AB (ref >60)
GFR, EST NON AFRICAN AMERICAN: 51 mL/min — AB (ref >60)
Glucose, Bld: 201 mg/dL — ABNORMAL HIGH (ref 65–99)
Potassium: 3.7 mmol/L (ref 3.5–5.1)
SODIUM: 132 mmol/L — AB (ref 135–145)
Total Bilirubin: 0.8 mg/dL (ref 0.3–1.2)
Total Protein: 4.5 g/dL — ABNORMAL LOW (ref 6.5–8.1)

## 2016-07-27 LAB — BPAM RBC
Blood Product Expiration Date: 201807172359
Blood Product Expiration Date: 201807182359
Blood Product Expiration Date: 201807192359
Blood Product Expiration Date: 201807192359
Blood Product Expiration Date: 201807192359
Blood Product Expiration Date: 201807192359
Unit Type and Rh: 5100
Unit Type and Rh: 5100
Unit Type and Rh: 5100
Unit Type and Rh: 5100
Unit Type and Rh: 5100
Unit Type and Rh: 5100

## 2016-07-27 LAB — GLUCOSE, CAPILLARY
Glucose-Capillary: 184 mg/dL — ABNORMAL HIGH (ref 65–99)
Glucose-Capillary: 184 mg/dL — ABNORMAL HIGH (ref 65–99)
Glucose-Capillary: 184 mg/dL — ABNORMAL HIGH (ref 65–99)
Glucose-Capillary: 194 mg/dL — ABNORMAL HIGH (ref 65–99)
Glucose-Capillary: 225 mg/dL — ABNORMAL HIGH (ref 65–99)

## 2016-07-27 LAB — DIFFERENTIAL
BASOS PCT: 1 %
Basophils Absolute: 0.1 10*3/uL (ref 0.0–0.1)
EOS ABS: 0.1 10*3/uL (ref 0.0–0.7)
Eosinophils Relative: 1 %
LYMPHS ABS: 1.9 10*3/uL (ref 0.7–4.0)
Lymphocytes Relative: 19 %
MONO ABS: 1.4 10*3/uL — AB (ref 0.1–1.0)
Monocytes Relative: 14 %
NEUTROS PCT: 65 %
Neutro Abs: 6.5 10*3/uL (ref 1.7–7.7)
WBC MORPHOLOGY: INCREASED

## 2016-07-27 LAB — CBC
HCT: 23.8 % — ABNORMAL LOW (ref 36.0–46.0)
Hemoglobin: 8.3 g/dL — ABNORMAL LOW (ref 12.0–15.0)
MCH: 35.8 pg — ABNORMAL HIGH (ref 26.0–34.0)
MCHC: 34.9 g/dL (ref 30.0–36.0)
MCV: 102.6 fL — AB (ref 78.0–100.0)
PLATELETS: 115 10*3/uL — AB (ref 150–400)
RBC: 2.32 MIL/uL — AB (ref 3.87–5.11)
RDW: 18.6 % — AB (ref 11.5–15.5)
WBC: 10 10*3/uL (ref 4.0–10.5)

## 2016-07-27 LAB — MAGNESIUM: MAGNESIUM: 1.8 mg/dL (ref 1.7–2.4)

## 2016-07-27 LAB — PREALBUMIN: PREALBUMIN: 8 mg/dL — AB (ref 18–38)

## 2016-07-27 LAB — TRIGLYCERIDES: Triglycerides: 214 mg/dL — ABNORMAL HIGH (ref <150)

## 2016-07-27 LAB — PHOSPHORUS: Phosphorus: 3.7 mg/dL (ref 2.5–4.6)

## 2016-07-27 MED ORDER — TRACE MINERALS CR-CU-MN-SE-ZN 10-1000-500-60 MCG/ML IV SOLN
INTRAVENOUS | Status: AC
  Administered 2016-07-27: 17:00:00 via INTRAVENOUS
  Filled 2016-07-27: qty 2400

## 2016-07-27 MED ORDER — MAGNESIUM SULFATE 4 GM/100ML IV SOLN
4.0000 g | Freq: Once | INTRAVENOUS | Status: AC
  Administered 2016-07-27: 4 g via INTRAVENOUS
  Filled 2016-07-27: qty 100

## 2016-07-27 MED ORDER — FUROSEMIDE 10 MG/ML IJ SOLN
40.0000 mg | Freq: Two times a day (BID) | INTRAMUSCULAR | Status: DC
  Administered 2016-07-27 – 2016-07-29 (×5): 40 mg via INTRAVENOUS
  Filled 2016-07-27 (×5): qty 4

## 2016-07-27 MED ORDER — POTASSIUM CHLORIDE 10 MEQ/50ML IV SOLN
10.0000 meq | INTRAVENOUS | Status: AC
  Administered 2016-07-27 (×3): 10 meq via INTRAVENOUS
  Filled 2016-07-27 (×3): qty 50

## 2016-07-27 MED ORDER — FAT EMULSION 20 % IV EMUL
240.0000 mL | INTRAVENOUS | Status: AC
  Administered 2016-07-27: 240 mL via INTRAVENOUS
  Filled 2016-07-27: qty 250

## 2016-07-27 MED ORDER — ALBUMIN HUMAN 25 % IV SOLN
12.5000 g | Freq: Four times a day (QID) | INTRAVENOUS | Status: AC
  Administered 2016-07-28 (×3): 12.5 g via INTRAVENOUS
  Filled 2016-07-27 (×3): qty 50

## 2016-07-27 MED ORDER — ONDANSETRON HCL 4 MG/2ML IJ SOLN
4.0000 mg | Freq: Four times a day (QID) | INTRAMUSCULAR | Status: DC | PRN
  Administered 2016-07-27 – 2016-08-22 (×58): 4 mg via INTRAVENOUS
  Filled 2016-07-27 (×60): qty 2

## 2016-07-27 MED ORDER — METOLAZONE 5 MG PO TABS
5.0000 mg | ORAL_TABLET | Freq: Every day | ORAL | Status: DC
  Administered 2016-07-28 – 2016-07-30 (×3): 5 mg via ORAL
  Filled 2016-07-27 (×3): qty 1

## 2016-07-27 MED ORDER — SODIUM CHLORIDE 0.9 % IV SOLN
1.0000 g | Freq: Three times a day (TID) | INTRAVENOUS | Status: AC
  Administered 2016-07-27 – 2016-07-29 (×8): 1 g via INTRAVENOUS
  Filled 2016-07-27 (×8): qty 1

## 2016-07-27 NOTE — Progress Notes (Signed)
Chaplain presented to the patient's room, introduced self. The patient was sleeping at the time of this visit, but the husband was present. Offered the ministry of presence, and encouragement as he shared his wife had been hospitalized five weeks, and is yet facing additional medical intervention. He has been at her side the entire , and celebrated their wedding anniversary yesterday.   A prayer of healing and wholeness was offered, spiritual care support wiill continue, and they also have a faith community that supports them as well. He was appreciative of the support. Chaplain Yaakov Guthrie [phone]

## 2016-07-27 NOTE — Progress Notes (Signed)
Nutrition Follow-up  DOCUMENTATION CODES:   Obesity unspecified  INTERVENTION:   Continue TPN per Pharmacy  Consider Cortrak tube placed at LOT and attempt trickle feedings.    NUTRITION DIAGNOSIS:   Inadequate oral intake related to altered GI function as evidenced by NPO status. Ongoing.   GOAL:   Patient will meet greater than or equal to 90% of their needs Met.   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 Pt admitted for pain control, blood pressure control, bed rest and IV hydration.   Possible transfer to tertiary care center for TEVAR Pt sleeping.  6/23 xray continues to show small and large bowel ileus  TPN:  - Cinimix E 5/15 at 100 ml/hr and 20% ILE at 20 ml/hr x 12 hrs. TPN provides 2184 kcal and 120 gm of protein per day - Daily multivitamin in TPN - Trace elements in TPN every other day d/t shortage  Diet Order:  Diet clear liquid Room service appropriate? Yes; Fluid consistency: Thin TPN (CLINIMIX-E) Adult TPN (CLINIMIX-E) Adult  Skin:  Reviewed, no issues  Last BM:  6/18  Height:   Ht Readings from Last 1 Encounters:  07/25/16 '5\' 3"'  (1.6 m)    Weight:   Wt Readings from Last 1 Encounters:  07/27/16 199 lb 8.3 oz (90.5 kg)    Ideal Body Weight:  52.2 kg  BMI:  Body mass index is 35.34 kg/m.  Estimated Nutritional Needs:   Kcal:  2000-2200  Protein:  110-120 gm  Fluid:  per MD  EDUCATION NEEDS:   No education needs identified at this time  [place], [place], [place] Pager [phone] After Hours Pager

## 2016-07-27 NOTE — Progress Notes (Signed)
Physical Therapy Treatment Patient Details Name: Haley Graham MRN: [id number] DOB: March 28, 1955 Today's Date: 07/27/2016    History of Present Illness Pt adm with type B aortic dissection. Treated non surgically with BP control and pain control. Pt developed pancreatitis and ileus with NG tube placed. PMH - anxiety, arthritis    PT Comments    Patient continues to mobilize well once OOB but does require max encouragement and education on benefits of OOB mobility to her recovery. Husband present and motivated pt. SpO2 > 90% on RA with mobility. Continue to progress as tolerated.    Follow Up Recommendations  Home health PT;Supervision/Assistance - 24 hour     Equipment Recommendations  Rolling walker with 5" wheels    Recommendations for Other Services       Precautions / Restrictions Precautions Precautions: Fall Restrictions Weight Bearing Restrictions: No    Mobility  Bed Mobility Overal bed mobility: Needs Assistance Bed Mobility: Supine to Sit;Sit to Supine     Supine to sit: HOB elevated;Min assist Sit to supine: Mod assist   General bed mobility comments: cues for sequencing; pt stated "I can't do it on my own"; assist to bring bilat LE into bed and elevate trunk into sitting  Transfers Overall transfer level: Needs assistance Equipment used: Rolling walker (2 wheeled) Transfers: Sit to/from Stand Sit to Stand: Min guard         General transfer comment: cues for safe hand placement; no physical assist required; min guard for safety  Ambulation/Gait Ambulation/Gait assistance: +2 safety/equipment;Min guard (chair follow) Ambulation Distance (Feet): 250 Feet Assistive device: Rolling walker (2 wheeled) Gait Pattern/deviations: Step-through pattern;Decreased stride length;Drifts right/left Gait velocity: decr   General Gait Details: cues for pursed lip breathing; SpO2 >90% on RA; HR up to 130s and RR into 30s at times   Stairs             Wheelchair Mobility    Modified Rankin (Stroke Patients Only)       Balance Overall balance assessment: Needs assistance Sitting-balance support: No upper extremity supported;Feet supported Sitting balance-Leahy Scale: Good     Standing balance support: Bilateral upper extremity supported;During functional activity Standing balance-Leahy Scale: Poor Standing balance comment: walker and min guard assist for static standing                            Cognition Arousal/Alertness: Awake/alert Behavior During Therapy: Anxious;Flat affect Overall Cognitive Status: Impaired/Different from baseline Area of Impairment: Following commands;Attention;Problem solving;Awareness                   Current Attention Level: Sustained   Following Commands: Follows multi-step commands consistently;Follows multi-step commands with increased time Safety/Judgement: Decreased awareness of safety Awareness: Intellectual Problem Solving: Decreased initiation;Requires verbal cues;Slow processing General Comments: pt did not remember ambulating earlier today with nursing staff and reported that she had just gotten up and her BP dropped although pt has not been up recently and all VSS when mobilizing with RN      Exercises      General Comments General comments (skin integrity, edema, etc.): husband present and assisted to motivate pt to mobilize      Pertinent Vitals/Pain Pain Assessment: Faces Faces Pain Scale: Hurts even more Pain Location: back and abdomen Pain Descriptors / Indicators: Grimacing;Aching Pain Intervention(s): Monitored during session;Repositioned;Premedicated before session    Home Living  Prior Function            PT Goals (current goals can now be found in the care plan section) Acute Rehab PT Goals PT Goal Formulation: With patient/family Time For Goal Achievement: 08/05/16 Potential to Achieve Goals:  Fair Progress towards PT goals: Progressing toward goals    Frequency    Min 3X/week      PT Plan Current plan remains appropriate    Co-evaluation              AM-PAC PT "6 Clicks" Daily Activity  Outcome Measure  Difficulty turning over in bed (including adjusting bedclothes, sheets and blankets)?: A Little Difficulty moving from lying on back to sitting on the side of the bed? : A Lot Difficulty sitting down on and standing up from a chair with arms (e.g., wheelchair, bedside commode, etc,.)?: Total Help needed moving to and from a bed to chair (including a wheelchair)?: A Little Help needed walking in hospital room?: A Little Help needed climbing 3-5 steps with a railing? : A Lot 6 Click Score: 14    End of Session Equipment Utilized During Treatment: Gait belt Activity Tolerance: Patient tolerated treatment well Patient left: with call bell/phone within reach;with family/visitor present;in bed;with SCD's reapplied Nurse Communication: Mobility status PT Visit Diagnosis: Muscle weakness (generalized) (M62.81);Other abnormalities of gait and mobility (R26.89)     Time: 2440-1027 PT Time Calculation (min) (ACUTE ONLY): 47 min  Charges:  $Gait Training: 8-22 mins $Therapeutic Activity: 23-37 mins                    G Codes:       Earney Navy, PTA Pager: [phone]     Darliss Cheney 07/27/2016, 4:32 PM

## 2016-07-27 NOTE — Progress Notes (Addendum)
[facility] Progress Note  Subjective    Drinking ginger ale slowly. Denies nausea. Said had difficulty with walking this am.   Objective Vitals:   07/27/16 0100 07/27/16 0200  BP: (!) 119/51 (!) 119/52  Pulse: (!) 110 (!) 109  Resp: (!) 24 (!) 23  Temp:      Intake/Output Summary (Last 24 hours) at 07/27/16 0722 Last data filed at 07/27/16 0600  Gross per 24 hour  Intake          3328.33 ml  Output             2210 ml  Net          1118.33 ml   Sitting up in chair in NAD. Abdomen distended but soft. Mild pain to right upper and lower quadrants. 2+ DP pulses bilaterally.   Assessment/Planning: 61 y.o. female is s/p:  attempted tevar aborted due to inability to access true lumen from below 3 Days Post-Op   Creatinine continuing to improve.  Doing ok with clears so far, but not taking in very much.  Awaiting opinion from [facility].   Alvia Grove 07/27/2016 7:22 AM --  Laboratory CBC    Component Value Date/Time   WBC 10.0 07/27/2016 0500   HGB 8.3 (L) 07/27/2016 0500   HCT 23.8 (L) 07/27/2016 0500   PLT 115 (L) 07/27/2016 0500    BMET    Component Value Date/Time   NA 132 (L) 07/27/2016 0500   K 3.7 07/27/2016 0500   CL 105 07/27/2016 0500   CO2 20 (L) 07/27/2016 0500   GLUCOSE 201 (H) 07/27/2016 0500   BUN 48 (H) 07/27/2016 0500   CREATININE 1.15 (H) 07/27/2016 0500   CALCIUM 7.6 (L) 07/27/2016 0500   GFRNONAA 51 (L) 07/27/2016 0500   GFRAA 59 (L) 07/27/2016 0500    COAG Lab Results  Component Value Date   INR 1.72 07/19/2016   No results found for: PTT  Antibiotics Anti-infectives    Start     Dose/Rate Route Frequency Ordered Stop   07/25/16 2200  meropenem (MERREM) 1 g in sodium chloride 0.9 % 100 mL IVPB     1 g 200 mL/hr over 30 Minutes Intravenous Every 12 hours 07/25/16 1223     07/23/16 1000  ceFEPIme (MAXIPIME) 1 g in dextrose 5 % 50 mL IVPB  Status:  Discontinued     1 g 100 mL/hr over 30 Minutes Intravenous  Every 8 hours 07/23/16 0911 07/23/16 0917   07/23/16 1000  meropenem (MERREM) 1 g in sodium chloride 0.9 % 100 mL IVPB  Status:  Discontinued     1 g 200 mL/hr over 30 Minutes Intravenous Every 8 hours 07/23/16 0919 07/25/16 1223   07/18/16 0000  levofloxacin (LEVAQUIN) IVPB 500 mg  Status:  Discontinued     500 mg 100 mL/hr over 60 Minutes Intravenous Every 24 hours 07/17/16 0903 07/17/16 0938   07/17/16 1100  vancomycin (VANCOCIN) 1,500 mg in sodium chloride 0.9 % 500 mL IVPB     1,500 mg 250 mL/hr over 120 Minutes Intravenous Every 24 hours 07/17/16 0946 07/23/16 1221   07/17/16 1000  levofloxacin (LEVAQUIN) tablet 500 mg  Status:  Discontinued     500 mg Oral Daily 07/17/16 0851 07/17/16 0903   07/15/16 0900  ciprofloxacin (CIPRO) tablet 500 mg  Status:  Discontinued     500 mg Oral 2 times daily 07/15/16 0828 07/17/16 0851   07/14/16 1115  levofloxacin (LEVAQUIN) tablet  500 mg  Status:  Discontinued     500 mg Oral Daily 07/14/16 1107 07/14/16 1214   06/26/16 0830  meropenem (MERREM) 1 g in sodium chloride 0.9 % 100 mL IVPB  Status:  Discontinued     1 g 200 mL/hr over 30 Minutes Intravenous Every 8 hours 06/26/16 0810 07/06/16 0804   06/22/16 1800  clindamycin (CLEOCIN) IVPB 600 mg  Status:  Discontinued     600 mg 100 mL/hr over 30 Minutes Intravenous Every 8 hours 06/22/16 1723 06/26/16 0810   06/22/16 1000  azithromycin (ZITHROMAX) 250 mg in dextrose 5 % 125 mL IVPB  Status:  Discontinued     250 mg 125 mL/hr over 60 Minutes Intravenous Every 24 hours 06/22/16 0826 06/27/16 1019   06/22/16 0900  levofloxacin (LEVAQUIN) IVPB 500 mg  Status:  Discontinued     500 mg 100 mL/hr over 60 Minutes Intravenous Every 24 hours 06/22/16 0759 06/22/16 [place], PA-C [facility] Office: [phone] Pager: [phone] 07/27/2016 7:22 AM  I have independently interviewed patient and agree with PA assessment and plan above.   Deryk Bozman C. Donzetta Matters,  MD [facility] [place] Office: [phone] Pager: [phone]

## 2016-07-27 NOTE — Progress Notes (Signed)
PHARMACY - ADULT TOTAL PARENTERAL NUTRITION CONSULT NOTE   Pharmacy Consult:  TPN Indication:  Prolonged NPO status/high NG output   Patient Measurements: Height: 5\' 3"  (160 cm) Weight: 197 lb 12 oz (89.7 kg) IBW/kg (Calculated) : 52.4 TPN AdjBW (KG): 61.7 Body mass index is 35.03 kg/m. 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 Patient had a significant history of N/V for 2-4 weeks PTA but weight appears to have been maintained. Patient has had several NG tube replacement due to nausea and vomiting.  Pharmacy consulted to manage TPN since 06/23/16.    GI: persistent ileus and mesenteric ischemia, s/p arch/abdominal aortogram and US guided cannulation of common femoral arteries and left brachial artery 6/22.   Prealbumin low at 8, BM x3 - PPI PO. Endo: DM on glipizide/metformin PTA - CBGs uncontrolled, off Lantus 6/22 Insulin requirements in the past 24 hours: 32 units SSI + 74 units in TPN Lytes: low Na/CO2, K 3.7 (goal >/= 4 with ileus, 3 runs ordered), Mg 1.8 (goal >/= 2 with ileus), others WNL Renal: SCr 1.15, BUN 48 - NS at 76ml/hr, UOP 1 ml/kg/hr, net +22.9L since admit Pulm: pleural effusion, on 2L [place] - Brovana, Pulmicort, PRN albuterol Cards: Stanford type B aortic dissection (stable) - BP controlled, tachy - Lopressor, IV Lasix, PRN labetalol.  Hepatobil: LFTs / tbili WNL, INR 1.72.  TG down to 212. Neuro: Celexa, Klonopin, PRN Fentanyl/Dialudid/Xanax/APAP - pain score 4 ID: Merrem resumed (6/21 >> ) for fever/possible PNA, s/p 3d Cipro >> s/p 7d vanc for E.faecalis UTI.  S/p Merrem (5/25-6/4) for PNA. C.diff negative.  Afeb, WBC WNL. 6/21 BCx - pending Best Practices: SCDs, CHG, MC TPN Access: PICC placed 06/21/16  TPN start date: 06/23/16  Nutritional Goals (per RD recommendation on 6/18): 2000-2200 kCal and 110-120 gm protein  Current Nutrition:   TPN Clear liquid diet (eating 0-50% of  meal) Boost BID (received none yesterday)   Plan:  - Continue Cinimix E 5/15 at 100 ml/hr and 20% ILE at 20 ml/hr x 12 hrs. TPN provides 2184 kCal and 120 gm of protein per day, meeting 100% of total needs.   - Daily multivitamin in TPN - Trace elements in TPN every other day d/t shortage, due 6/25 - Continue TCTS SSI Q6H per MD + increase regular insulin in TPN to 95 units.  Would hold off on resuming Lantus for possibility of cycling TPN. - Mag sulfate 4gm IV x 1 - F/U transferring to tertiary center and [facility] recommendation - TG twice weekly - Consider a trial of TF to supplement oral intake and wean off of TPN    Haley Graham D. [name], PharmD, BCPS Pager:  [phone] 07/27/2016, 11:23 AM

## 2016-07-27 NOTE — Progress Notes (Signed)
PHARMACY NOTE:  ANTIMICROBIAL RENAL DOSAGE ADJUSTMENT  Current antimicrobial regimen includes a mismatch between antimicrobial dosage and estimated renal function.  As per policy approved by the Pharmacy & Therapeutics and Medical Executive Committees, the antimicrobial dosage will be adjusted accordingly.  Current antimicrobial dosage: Meropenem 1g IV q 12 hrs  Indication: PNA  Renal Function:  Estimated Creatinine Clearance: 56.2 mL/min (A) (by C-G formula based on SCr of 1.15 mg/dL (H)). []      On intermittent HD, scheduled: []      On CRRT    Antimicrobial dosage has been changed to:  Meropenem 1g IV q 8 hrs   Thank you for allowing pharmacy to be a part of this patient's care.  Uvaldo Rising, BCPS  Clinical Pharmacist Pager [phone]  07/27/2016 8:50 AM

## 2016-07-27 NOTE — Progress Notes (Signed)
3 Days Post-Op Procedure(s) (LRB): ARCH AND ABDOMINAL AORTA  ANGIOGRAM (N/A) Subjective: persistent ileus- nauseated today Disc of CTA and aortic arteriogram study sent to [facility] for opinion regarding TEVAR for type B dissection  BP, labs stable  continues on TNA Objective: Vital signs in last 24 hours: Temp:  [97.7 F (36.5 C)-98.8 F (37.1 C)] 97.9 F (36.6 C) (06/25 1516) Pulse Rate:  [103-126] 107 (06/25 1800) Cardiac Rhythm: Sinus tachycardia (06/25 0802) Resp:  [19-39] 26 (06/25 1800) BP: (101-120)/(41-64) 114/57 (06/25 1800) SpO2:  [90 %-96 %] 93 % (06/25 1800) Weight:  [199 lb 8.3 oz (90.5 kg)] 199 lb 8.3 oz (90.5 kg) (06/25 0600)  Hemodynamic parameters for last 24 hours:  stable  Intake/Output from previous day: 06/24 0701 - 06/25 0700 In: 3438.3 [P.O.:120; I.V.:3118.3; IV Piggyback:200] Out: 0211 [Urine:2210] Intake/Output this shift: No intake/output data recorded.  abd distended mildly but non tender  Lab Results:  Recent Labs  07/26/16 0415 07/27/16 0500  WBC 11.3* 10.0  HGB 8.7* 8.3*  HCT 27.0* 23.8*  PLT 102* 115*   BMET:  Recent Labs  07/26/16 0415 07/27/16 0500  NA 133* 132*  K 3.6 3.7  CL 106 105  CO2 23 20*  GLUCOSE 195* 201*  BUN 49* 48*  CREATININE 1.36* 1.15*  CALCIUM 7.5* 7.6*    PT/INR: No results for input(s): LABPROT, INR in the last 72 hours. ABG    Component Value Date/Time   PHART 7.430 06/28/2016 1615   HCO3 20.7 06/28/2016 1615   TCO2 22 06/28/2016 1615   ACIDBASEDEF 3.0 (H) 06/28/2016 1615   O2SAT 93.0 06/28/2016 1615   CBG (last 3)   Recent Labs  07/27/16 0504 07/27/16 1141 07/27/16 1736  GLUCAP 184* 225* 184*    Assessment/Plan: S/P Procedure(s) (LRB): ARCH AND ABDOMINAL AORTA  ANGIOGRAM (N/A) Ileus from probable  chronic mesenteric ischemia Cont TNA and supportive care Plan for assessment by [facility] d/w family  LOS: 11 days    Haley Graham 07/27/2016

## 2016-07-28 DIAGNOSIS — I7102 Dissection of abdominal aorta: Secondary | ICD-10-CM

## 2016-07-28 LAB — CULTURE, BLOOD (ROUTINE X 2)
Culture: NO GROWTH
Culture: NO GROWTH
Special Requests: ADEQUATE
Special Requests: ADEQUATE

## 2016-07-28 LAB — OCCULT BLOOD X 1 CARD TO LAB, STOOL: Fecal Occult Bld: POSITIVE — AB

## 2016-07-28 LAB — BASIC METABOLIC PANEL
Anion gap: 7 (ref 5–15)
BUN: 42 mg/dL — ABNORMAL HIGH (ref 6–20)
CO2: 22 mmol/L (ref 22–32)
Calcium: 7.5 mg/dL — ABNORMAL LOW (ref 8.9–10.3)
Chloride: 104 mmol/L (ref 101–111)
Creatinine, Ser: 1.12 mg/dL — ABNORMAL HIGH (ref 0.44–1.00)
GFR calc Af Amer: >60 mL/min (ref >60)
GFR calc non Af Amer: 53 mL/min — ABNORMAL LOW (ref >60)
Glucose, Bld: 154 mg/dL — ABNORMAL HIGH (ref 65–99)
Potassium: 3.2 mmol/L — ABNORMAL LOW (ref 3.5–5.1)
Sodium: 133 mmol/L — ABNORMAL LOW (ref 135–145)

## 2016-07-28 LAB — GLUCOSE, CAPILLARY
Glucose-Capillary: 141 mg/dL — ABNORMAL HIGH (ref 65–99)
Glucose-Capillary: 167 mg/dL — ABNORMAL HIGH (ref 65–99)
Glucose-Capillary: 167 mg/dL — ABNORMAL HIGH (ref 65–99)

## 2016-07-28 LAB — CBC
HCT: 20.4 % — ABNORMAL LOW (ref 36.0–46.0)
Hemoglobin: 7.2 g/dL — ABNORMAL LOW (ref 12.0–15.0)
MCH: 36.4 pg — ABNORMAL HIGH (ref 26.0–34.0)
MCHC: 35.3 g/dL (ref 30.0–36.0)
MCV: 103 fL — ABNORMAL HIGH (ref 78.0–100.0)
Platelets: 139 10*3/uL — ABNORMAL LOW (ref 150–400)
RBC: 1.98 MIL/uL — ABNORMAL LOW (ref 3.87–5.11)
RDW: 20.8 % — ABNORMAL HIGH (ref 11.5–15.5)
WBC: 9.3 10*3/uL (ref 4.0–10.5)

## 2016-07-28 LAB — MAGNESIUM: Magnesium: 2 mg/dL (ref 1.7–2.4)

## 2016-07-28 MED ORDER — FENTANYL CITRATE (PF) 100 MCG/2ML IJ SOLN
25.0000 ug | Freq: Every day | INTRAMUSCULAR | Status: DC | PRN
  Administered 2016-07-29 – 2016-08-08 (×8): 25 ug via INTRAVENOUS
  Filled 2016-07-28 (×9): qty 2

## 2016-07-28 MED ORDER — FAT EMULSION 20 % IV EMUL
240.0000 mL | INTRAVENOUS | Status: AC
  Administered 2016-07-28: 240 mL via INTRAVENOUS
  Filled 2016-07-28: qty 250

## 2016-07-28 MED ORDER — POTASSIUM CHLORIDE 10 MEQ/50ML IV SOLN
INTRAVENOUS | Status: AC
  Filled 2016-07-28: qty 50

## 2016-07-28 MED ORDER — M.V.I. ADULT IV INJ
INTRAVENOUS | Status: AC
  Administered 2016-07-28: 17:00:00 via INTRAVENOUS
  Filled 2016-07-28: qty 2400

## 2016-07-28 MED ORDER — POTASSIUM CHLORIDE 10 MEQ/50ML IV SOLN
10.0000 meq | INTRAVENOUS | Status: AC
  Administered 2016-07-28: 10 meq via INTRAVENOUS
  Filled 2016-07-28 (×3): qty 50

## 2016-07-28 MED ORDER — POTASSIUM CHLORIDE 10 MEQ/50ML IV SOLN
10.0000 meq | INTRAVENOUS | Status: AC
  Administered 2016-07-28 (×2): 10 meq via INTRAVENOUS
  Filled 2016-07-28: qty 50

## 2016-07-28 MED ORDER — POTASSIUM CHLORIDE 10 MEQ/50ML IV SOLN
10.0000 meq | INTRAVENOUS | Status: AC
  Administered 2016-07-28 (×3): 10 meq via INTRAVENOUS
  Filled 2016-07-28: qty 50

## 2016-07-28 NOTE — Progress Notes (Signed)
4 Days Post-Op Procedure(s) (LRB): ARCH AND ABDOMINAL AORTA  ANGIOGRAM (N/A) Subjective: Subacute mesenteric ischemia after type B aortic dissection Persistent requirement for intravenous feeding-TPN Edema related to low protein-serum albumin 1.3. Lasix 40 mg IV twice a day Lower GI bleeding with drop in hemoglobin 8.3--7.2 Patient remains hemodynamically stable Objective: Vital signs in last 24 hours: Temp:  [96.7 F (35.9 C)-99.3 F (37.4 C)] 99.3 F (37.4 C) (06/26 1700) Pulse Rate:  [105-115] 106 (06/26 1500) Cardiac Rhythm: Sinus tachycardia (06/26 1200) Resp:  [21-32] 30 (06/26 1500) BP: (92-130)/(42-56) 104/49 (06/26 1500) SpO2:  [92 %-97 %] 94 % (06/26 1500) Weight:  [196 lb 10.4 oz (89.2 kg)] 196 lb 10.4 oz (89.2 kg) (06/26 0559)  Hemodynamic parameters for last 24 hours:    Intake/Output from previous day: 06/25 0701 - 06/26 0700 In: 3416.7 [P.O.:50; I.V.:2816.7; IV Piggyback:550] Out: 3267 [Urine:3655] Intake/Output this shift: Total I/O In: 770 [I.V.:770] Out: 1135 [Urine:1135]  Middle-aged female responsive but not comfortable and anxious Lungs clear No cardiac murmur Good peripheral pulses 2+ peripheral edema Abdomen baseline distended without guarding Neuro intact  Lab Results:  Recent Labs  07/27/16 0500 07/28/16 1616  WBC 10.0 9.3  HGB 8.3* 7.2*  HCT 23.8* 20.4*  PLT 115* 139*   BMET:  Recent Labs  07/27/16 0500 07/28/16 0500  NA 132* 133*  K 3.7 3.2*  CL 105 104  CO2 20* 22  GLUCOSE 201* 154*  BUN 48* 42*  CREATININE 1.15* 1.12*  CALCIUM 7.6* 7.5*    PT/INR: No results for input(s): LABPROT, INR in the last 72 hours. ABG    Component Value Date/Time   PHART 7.430 06/28/2016 1615   HCO3 20.7 06/28/2016 1615   TCO2 22 06/28/2016 1615   ACIDBASEDEF 3.0 (H) 06/28/2016 1615   O2SAT 93.0 06/28/2016 1615   CBG (last 3)   Recent Labs  07/27/16 2347 07/28/16 0557 07/28/16 1227  GLUCAP 184* 167* 167*     Assessment/Plan: S/P Procedure(s) (LRB): ARCH AND ABDOMINAL AORTA  ANGIOGRAM (N/A) Attempt at stent graft-TEVAR last week not successful because of inability to access true lumen Studies have been sent to [facility] aortic dissection center for recommendations Will give patient 1 unit of packed cells this evening and follow hemoglobin carefully. If bleeding persists she will need to be seen again by GI medicine. She is not on aspirin or Lovenox.   LOS: 38 days    [name] 07/28/2016

## 2016-07-28 NOTE — Progress Notes (Signed)
PHARMACY - ADULT TOTAL PARENTERAL NUTRITION CONSULT NOTE   Pharmacy Consult:  TPN Indication:  Prolonged NPO status/high NG output   Patient Measurements: Height: 5\' 3"  (160 cm) Weight: 196 lb 10.4 oz (89.2 kg) IBW/kg (Calculated) : 52.4 TPN AdjBW (KG): 61.7 Body mass index is 34.84 kg/m. 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 Patient had a significant history of N/V for 2-4 weeks PTA but weight appears to have been maintained. Patient has had several NG tube replacement due to nausea and vomiting.  Pharmacy consulted to manage TPN since 06/23/16.    GI: persistent ileus and mesenteric ischemia, s/p arch/abdominal aortogram and US guided cannulation of common femoral arteries and left brachial artery 6/22.   Prealbumin low at 8, BM x9 - PPI PO Endo: DM on glipizide/metformin PTA - CBGs improving, off Lantus 6/22 Insulin requirements in the past 24 hours: 16 units SSI + 95 units in TPN Lytes: mild hyponatremia, K 3.2 (goal >/= 4 with ileus, 3 runs ordered), others WNL Renal: SCr 1.12, BUN 42 - NS at 60ml/hr, UOP 1.7 ml/kg/hr, net +20.3L Pulm: pleural effusion, remains on 2L [place] - Brovana, Pulmicort, PRN albuterol Cards: aortic dissection stable, unsuccessful TEVAR - BP controlled, tachy - Lopressor, IV Lasix incr to BID, metolazone/albumin added 6/26, PRN metoprolol Hepatobil: LFTs / tbili WNL, INR 1.72.  TG down to 212. Neuro: Celexa, Klonopin, PRN Fentanyl/Dialudid/Xanax/APAP - pain score 4-8 ID: Merrem resumed (6/21 >> ) for fever/possible PNA, s/p 3d Cipro >> s/p 7d vanc for E.faecalis UTI.  S/p Merrem (5/25-6/4) for PNA. C.diff negative.  Afeb, WBC WNL. 6/21 BCx - pending Best Practices: SCDs, CHG, MC TPN Access: PICC placed 06/21/16  TPN start date: 06/23/16  Nutritional Goals (per RD recommendation on 6/18): 2000-2200 kCal and 110-120 gm protein  Current Nutrition:   TPN Clear liquid diet  (eating 0-50% of meal) Boost BID (received none yesterday)   Plan:  - Continue Cinimix E 5/15 at 100 ml/hr and 20% ILE at 20 ml/hr x 12 hrs. TPN provides 2184 kCal and 120 gm of protein per day, meeting 100% of total needs.   - Daily multivitamin in TPN - Trace elements in TPN every other day d/t shortage, due 6/27 - Continue TCTS SSI Q6H per MD + increase regular insulin in TPN to 100 units.  - KCL x 3 additional runs for a total of 6 runs today - TG twice weekly - F/U transferring to tertiary center and [facility] recommendation - Consider a trial of TF to supplement oral intake and wean off of TPN    Haley Graham, PharmD, BCPS Pager:  [phone] 07/28/2016, 7:32 AM

## 2016-07-29 LAB — PREPARE RBC (CROSSMATCH)

## 2016-07-29 LAB — BASIC METABOLIC PANEL
Anion gap: 6 (ref 5–15)
BUN: 38 mg/dL — ABNORMAL HIGH (ref 6–20)
CO2: 27 mmol/L (ref 22–32)
Calcium: 7.8 mg/dL — ABNORMAL LOW (ref 8.9–10.3)
Chloride: 98 mmol/L — ABNORMAL LOW (ref 101–111)
Creatinine, Ser: 1.15 mg/dL — ABNORMAL HIGH (ref 0.44–1.00)
GFR calc Af Amer: 59 mL/min — ABNORMAL LOW (ref >60)
GFR calc non Af Amer: 51 mL/min — ABNORMAL LOW (ref >60)
Glucose, Bld: 123 mg/dL — ABNORMAL HIGH (ref 65–99)
Potassium: 2.4 mmol/L — CL (ref 3.5–5.1)
Sodium: 131 mmol/L — ABNORMAL LOW (ref 135–145)

## 2016-07-29 LAB — GLUCOSE, CAPILLARY
Glucose-Capillary: 142 mg/dL — ABNORMAL HIGH (ref 65–99)
Glucose-Capillary: 129 mg/dL — ABNORMAL HIGH (ref 65–99)
Glucose-Capillary: 135 mg/dL — ABNORMAL HIGH (ref 65–99)
Glucose-Capillary: 129 mg/dL — ABNORMAL HIGH (ref 65–99)
Glucose-Capillary: 145 mg/dL — ABNORMAL HIGH (ref 65–99)

## 2016-07-29 LAB — CBC
HCT: 24.9 % — ABNORMAL LOW (ref 36.0–46.0)
Hemoglobin: 8.6 g/dL — ABNORMAL LOW (ref 12.0–15.0)
MCH: 34.7 pg — ABNORMAL HIGH (ref 26.0–34.0)
MCHC: 34.5 g/dL (ref 30.0–36.0)
MCV: 100.4 fL — ABNORMAL HIGH (ref 78.0–100.0)
Platelets: 156 10*3/uL (ref 150–400)
RBC: 2.48 MIL/uL — ABNORMAL LOW (ref 3.87–5.11)
RDW: 19.9 % — ABNORMAL HIGH (ref 11.5–15.5)
WBC: 12.1 10*3/uL — ABNORMAL HIGH (ref 4.0–10.5)

## 2016-07-29 LAB — APTT: aPTT: 31 seconds (ref 24–36)

## 2016-07-29 LAB — PROTIME-INR
INR: 1.08
Prothrombin Time: 14 seconds (ref 11.4–15.2)

## 2016-07-29 MED ORDER — FUROSEMIDE 10 MG/ML IJ SOLN
40.0000 mg | Freq: Every day | INTRAMUSCULAR | Status: DC
  Administered 2016-07-30 – 2016-08-03 (×5): 40 mg via INTRAVENOUS
  Filled 2016-07-29 (×5): qty 4

## 2016-07-29 MED ORDER — POTASSIUM CHLORIDE 10 MEQ/50ML IV SOLN
10.0000 meq | INTRAVENOUS | Status: AC
  Administered 2016-07-29 (×4): 10 meq via INTRAVENOUS
  Filled 2016-07-29 (×4): qty 50

## 2016-07-29 MED ORDER — FAT EMULSION 20 % IV EMUL
240.0000 mL | INTRAVENOUS | Status: AC
  Administered 2016-07-29: 240 mL via INTRAVENOUS
  Filled 2016-07-29: qty 250

## 2016-07-29 MED ORDER — POTASSIUM CHLORIDE 10 MEQ/50ML IV SOLN
10.0000 meq | INTRAVENOUS | Status: AC
  Administered 2016-07-29 (×3): 10 meq via INTRAVENOUS
  Filled 2016-07-29 (×3): qty 50

## 2016-07-29 MED ORDER — TRACE MINERALS CR-CU-MN-SE-ZN 10-1000-500-60 MCG/ML IV SOLN
INTRAVENOUS | Status: AC
  Administered 2016-07-29: 18:00:00 via INTRAVENOUS
  Filled 2016-07-29: qty 2400

## 2016-07-29 MED ORDER — POTASSIUM CHLORIDE 10 MEQ/50ML IV SOLN
10.0000 meq | INTRAVENOUS | Status: AC
  Administered 2016-07-29 (×3): 10 meq via INTRAVENOUS
  Filled 2016-07-29: qty 50

## 2016-07-29 NOTE — Progress Notes (Signed)
Physical Therapy Treatment Patient Details Name: Haley Graham MRN: [id number] DOB: 1955-07-29 Today's Date: 07/29/2016    History of Present Illness Pt adm with type B aortic dissection. Treated non surgically with BP control and pain control. Pt developed pancreatitis and ileus with NG tube placed. PMH - anxiety, arthritis    PT Comments    Patient continues to be reluctant to mobilize. Educated on benefits of OOB mobility again this session. Pt with increased RR with ambulation as she becomes anxious with mobility and begins to hyperventilate. Pt needs max vc for breathing technique. Pt overall required min guard assist for all mobility this session and BP and SpO2 improved with mobility. Continue to progress as tolerated.    Follow Up Recommendations  Home health PT;Supervision/Assistance - 24 hour     Equipment Recommendations  Rolling walker with 5" wheels    Recommendations for Other Services       Precautions / Restrictions Precautions Precautions: Fall Restrictions Weight Bearing Restrictions: No    Mobility  Bed Mobility Overal bed mobility: Needs Assistance Bed Mobility: Rolling;Sidelying to Sit Rolling: Supervision Sidelying to sit: Min guard;HOB elevated       General bed mobility comments: min guard for safety; use of bed rail; cues for sequencing and technique  Transfers Overall transfer level: Needs assistance Equipment used: Rolling walker (2 wheeled) Transfers: Sit to/from Stand Sit to Stand: Min guard         General transfer comment: cues for safe hand placement  Ambulation/Gait Ambulation/Gait assistance: +2 safety/equipment;Min guard (chair follow) Ambulation Distance (Feet): 150 Feet Assistive device: Rolling walker (2 wheeled) Gait Pattern/deviations: Step-through pattern;Decreased stride length;Drifts right/left Gait velocity: decr   General Gait Details: RR increased into 30s; cues for pursed lip breathing; decreased  cadence   Stairs            Wheelchair Mobility    Modified Rankin (Stroke Patients Only)       Balance Overall balance assessment: Needs assistance Sitting-balance support: No upper extremity supported;Feet supported Sitting balance-Leahy Scale: Good     Standing balance support: During functional activity Standing balance-Leahy Scale: Poor                              Cognition Arousal/Alertness: Awake/alert Behavior During Therapy: Anxious;Flat affect Overall Cognitive Status: Impaired/Different from baseline Area of Impairment: Following commands;Attention;Problem solving;Awareness                   Current Attention Level: Sustained   Following Commands: Follows multi-step commands consistently;Follows multi-step commands with increased time Safety/Judgement: Decreased awareness of safety Awareness: Intellectual Problem Solving: Decreased initiation;Requires verbal cues;Slow processing        Exercises      General Comments        Pertinent Vitals/Pain Pain Assessment: Faces Faces Pain Scale: Hurts little more Pain Location: back and abdomen Pain Descriptors / Indicators: Aching Pain Intervention(s): Limited activity within patient's tolerance;Repositioned    Home Living                      Prior Function            PT Goals (current goals can now be found in the care plan section) Acute Rehab PT Goals PT Goal Formulation: With patient/family Time For Goal Achievement: 08/05/16 Potential to Achieve Goals: Fair Progress towards PT goals: Progressing toward goals    Frequency    Min 3X/week  PT Plan Current plan remains appropriate    Co-evaluation              AM-PAC PT "6 Clicks" Daily Activity  Outcome Measure  Difficulty turning over in bed (including adjusting bedclothes, sheets and blankets)?: Total Difficulty moving from lying on back to sitting on the side of the bed? :  Total Difficulty sitting down on and standing up from a chair with arms (e.g., wheelchair, bedside commode, etc,.)?: Total Help needed moving to and from a bed to chair (including a wheelchair)?: A Little Help needed walking in hospital room?: A Little Help needed climbing 3-5 steps with a railing? : A Little 6 Click Score: 12    End of Session Equipment Utilized During Treatment: Gait belt Activity Tolerance: Patient tolerated treatment well Patient left: with call bell/phone within reach;with family/visitor present;in chair Nurse Communication: Mobility status PT Visit Diagnosis: Muscle weakness (generalized) (M62.81);Other abnormalities of gait and mobility (R26.89)     Time: 5374-8270 PT Time Calculation (min) (ACUTE ONLY): 35 min  Charges:  $Gait Training: 8-22 mins $Therapeutic Activity: 8-22 mins                    G Codes:       Earney Navy, PTA Pager: [phone]     Darliss Cheney 07/29/2016, 4:23 PM

## 2016-07-29 NOTE — Progress Notes (Signed)
CRITICAL VALUE ALERT  Critical Value:  K+ 2.4  Date & Time Notied:  07/29/2016 at 0500  Provider Notified: MD not notified. Replaced per standing orders.   Orders Received/Actions taken: 3 runs of K+ ordered per standing orders.

## 2016-07-29 NOTE — Progress Notes (Addendum)
TCTS DAILY ICU PROGRESS NOTE                   [address]            [place] [address]          [phone]   5 Days Post-Op Procedure(s) (LRB): ARCH AND ABDOMINAL AORTA  ANGIOGRAM (N/A)  Total Length of Stay:  LOS: 39 days   Subjective: Patient with complaints of abdominal pain (which she has had). She states she had a horrible night because she did not sleep as she was peeing all night.  Objective: Vital signs in last 24 hours: Temp:  [98 F (36.7 C)-100 F (37.8 C)] 100 F (37.8 C) (06/27 0327) Pulse Rate:  [102-111] 105 (06/27 0700) Cardiac Rhythm: Sinus tachycardia (06/27 0400) Resp:  [21-39] 22 (06/27 0700) BP: (89-123)/(40-56) 100/46 (06/27 0700) SpO2:  [93 %-100 %] 94 % (06/27 0808) Weight:  [87.6 kg (193 lb 2 oz)] 87.6 kg (193 lb 2 oz) (06/27 0500)  Filed Weights   07/27/16 0600 07/28/16 0559 07/29/16 0500  Weight: 90.5 kg (199 lb 8.3 oz) 89.2 kg (196 lb 10.4 oz) 87.6 kg (193 lb 2 oz)      Intake/Output from previous day: 06/26 0701 - 06/27 0700 In: 1696 [P.O.:250; I.V.:2540; Blood:310; IV Piggyback:350] Out: 7893 [Urine:4785]  Intake/Output this shift: No intake/output data recorded.  Current Meds: Scheduled Meds: . arformoterol  15 mcg Nebulization BID  . budesonide (PULMICORT) nebulizer solution  0.5 mg Nebulization BID  . chlorhexidine  15 mL Mouth Rinse BID  . Chlorhexidine Gluconate Cloth  6 each Topical Q0600  . citalopram  40 mg Oral Daily  . clonazePAM  1 mg Oral QHS  . feeding supplement  1 Container Oral BID BM  . furosemide  40 mg Intravenous BID  . insulin aspart  0-24 Units Subcutaneous Q6H  . magic mouthwash  5 mL Oral QID  . mouth rinse  15 mL Mouth Rinse q12n4p  . metolazone  5 mg Oral Daily  . metoprolol tartrate  25 mg Oral BID  . pantoprazole  40 mg Oral Q1200  . sodium chloride flush  10-40 mL Intracatheter Q12H   Continuous Infusions: . sodium chloride    . sodium chloride 10 mL/hr at 07/28/16 2000  .  Marland KitchenTPN (CLINIMIX-E) Adult     And  . fat emulsion    . meropenem (MERREM) IV Stopped (07/29/16 0622)  . potassium chloride    . Marland KitchenTPN (CLINIMIX-E) Adult 100 mL/hr at 07/28/16 1800   PRN Meds:.Place/Maintain arterial line **AND** sodium chloride, acetaminophen **OR** acetaminophen, ALPRAZolam, fentaNYL (SUBLIMAZE) injection, Gerhardt's butt cream, HYDROmorphone, labetalol, ondansetron (ZOFRAN) IV, phenol, pneumococcal 23 valent vaccine, simethicone, sodium chloride flush  Heart: Slightly tachycardic Lungs: Clear to auscultation bilaterally Abdomen: Soft, distended, diffusely tender mostly across lower abdomen, rare bowel sounds Extremities: LE edema  Lab Results: CBC:  Recent Labs  07/28/16 1616 07/29/16 0325  WBC 9.3 12.1*  HGB 7.2* 8.6*  HCT 20.4* 24.9*  PLT 139* 156   BMET:   Recent Labs  07/28/16 0500 07/29/16 0325  NA 133* 131*  K 3.2* 2.4*  CL 104 98*  CO2 22 27  GLUCOSE 154* 123*  BUN 42* 38*  CREATININE 1.12* 1.15*  CALCIUM 7.5* 7.8*    CMET: Lab Results  Component Value Date   WBC 12.1 (H) 07/29/2016   HGB 8.6 (L) 07/29/2016   HCT 24.9 (L) 07/29/2016   PLT 156 07/29/2016  GLUCOSE 123 (H) 07/29/2016   TRIG 214 (H) 07/27/2016   ALT 28 07/27/2016   AST 21 07/27/2016   NA 131 (L) 07/29/2016   K 2.4 (LL) 07/29/2016   CL 98 (L) 07/29/2016   CREATININE 1.15 (H) 07/29/2016   BUN 38 (H) 07/29/2016   CO2 27 07/29/2016   INR 1.08 07/29/2016      PT/INR:   Recent Labs  07/29/16 0325  LABPROT 14.0  INR 1.08   Radiology: No results found.   Assessment/Plan: 1. CV-Slightly tachycardic this am with HR in the low 100's. SBP well controlled as 120's or less. Continue Lopressor 25 mg bid. 2. Pulmonary-On 2 liters of oxygen via [place]. Continue Pulmicort and Brovana nebs. 3. 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 On sips of clear liquids (not taking much) and TPN. Will check BMET in am. 4. Edema secondary to low  albumin-As discussed with Dr. Karie Fetch, will continue Lasix 40 mg daily (stop pm dose of Lasix) and Metazolone 5 mg daily.  5. DM-CBGs 167/141/129. Continue Insulin. 6. Anemia-H and H this am up to 8.6 and 24.9 (s/p transfusion yesterday). She is NOT on ecasa or Lovenox. 7. Supplement potassium 8. ID-on Meropenem for Enterococcus UTI (day 7 of 7). Will stop after today. 9. WBC increasing with fever to 100. She is being treated for a UTI and has had intestinal angina. 10. Awaiting transfer to tertiary care center, [place].   ZIMMERMAN,DONIELLE M PA-C 07/29/2016 8:14 AM   Edema better but fatigued from being up all night after pm dose lasix- will dose only am No more blood per rectum Hb improved 8.6 Cont current care P Prescott Gum MD

## 2016-07-29 NOTE — Progress Notes (Addendum)
PHARMACY - ADULT TOTAL PARENTERAL NUTRITION CONSULT NOTE   Pharmacy Consult:  TPN Indication:  Prolonged NPO status/high NG output   Patient Measurements: Height: 5\' 3"  (160 cm) Weight: 193 lb 2 oz (87.6 kg) IBW/kg (Calculated) : 52.4 TPN AdjBW (KG): 61.7 Body mass index is 34.21 kg/m. 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 Patient had a significant history of N/V for 2-4 weeks PTA but weight appears to have been maintained. Patient has had several NG tube replacement due to nausea and vomiting.  Pharmacy consulted to manage TPN since 06/23/16.    GI: persistent ileus and mesenteric ischemia, LGIB.   Prealbumin low at 8, BM x5 - PPI PO Endo: DM on glipizide/metformin PTA - CBGs well controlled Insulin requirements in the past 24 hours: 14 units SSI + 100 units in TPN Lytes: low Na/CL, K 2.4 (goal >/= 4 with ileus, 3 runs ordered), others WNL Renal: SCr 1.15, BUN down to 38 - NS at 42ml/hr, UOP 2.3 ml/kg/hr, net +18.5L Pulm: pleural effusion, remains on 2L [place] - Brovana, Pulmicort, PRN albuterol Cards: aortic dissection stable, unsuccessful TEVAR - BP controlled, tachy - Lopressor, IV Lasix incr to BID, metolazone added 6/26, albumin 6/26, PRN metoprolol Hepatobil: LFTs / tbili WNL, INR 1.72.  TG down to 212. Neuro: Celexa, Klonopin, PRN Fentanyl/Dialudid/Xanax/APAP - pain score 4 ID: Merrem resumed (6/21 >> ) for fever/possible PNA, s/p 3d Cipro >> s/p 7d vanc for E.faecalis UTI.  S/p Merrem (5/25-6/4) for PNA. C.diff negative.  Tmax 100, WBC up 12.1. 6/21 BCx negative, ?need to repeat C.diff testing Best Practices: SCDs, CHG, MC TPN Access: PICC placed 06/21/16  TPN start date: 06/23/16  Nutritional Goals (per RD recommendation on 6/18): 2000-2200 kCal and 110-120 gm protein  Current Nutrition:   TPN Clear liquid diet (eating 0-50% of meal) Boost BID (received 1 yesterday)    Plan:  - Continue  Cinimix E 5/15 at 100 ml/hr and 20% ILE at 20 ml/hr x 12 hrs. TPN provides 2184 kCal and 120 gm of protein per day, meeting 100% of total needs.   - Daily multivitamin in TPN - Trace elements in TPN every other day d/t shortage, due 6/27 - Continue TCTS SSI Q6H per MD + 100 units regular insulin in TPN - KCL x 3 additional runs for a total of 6 runs today (max that TPN pharmacist could order).  Additional supplementation per MD. - F/U transferring to tertiary center or [facility] recommendation - F/U AM labs   Kapena Hamme D. [name], PharmD, BCPS Pager:  [phone] 07/29/2016, 7:30 AM    ==========================  Addendum: - spoke to CVTS PA, give an additional 4 runs for a total of 10 runs today - Lasix has been reduced to once daily   Tressia Labrum D. [name], PharmD, BCPS Pager:  [phone] 07/29/2016, 12:56 PM

## 2016-07-29 NOTE — Progress Notes (Signed)
PT Cancellation Note  Patient Details Name: Haley Graham MRN: [id number] DOB: September 23, 1955   Cancelled Treatment:    Reason Eval/Treat Not Completed: Patient declined, no reason specified Pt declined mobility. Therapist provided max encouragement and education on benefits of OOB mobility. PT will check on pt later as time allows.    Salina April, PTA Pager: [phone]   07/29/2016, 9:11 AM

## 2016-07-30 LAB — CBC
HEMATOCRIT: 27.4 % — AB (ref 36.0–46.0)
Hemoglobin: 9 g/dL — ABNORMAL LOW (ref 12.0–15.0)
MCH: 31.1 pg (ref 26.0–34.0)
MCHC: 32.8 g/dL (ref 30.0–36.0)
MCV: 94.8 fL (ref 78.0–100.0)
Platelets: 181 10*3/uL (ref 150–400)
RBC: 2.89 MIL/uL — ABNORMAL LOW (ref 3.87–5.11)
RDW: 17.8 % — AB (ref 11.5–15.5)
WBC: 13 10*3/uL — ABNORMAL HIGH (ref 4.0–10.5)

## 2016-07-30 LAB — GLUCOSE, CAPILLARY
Glucose-Capillary: 124 mg/dL — ABNORMAL HIGH (ref 65–99)
Glucose-Capillary: 126 mg/dL — ABNORMAL HIGH (ref 65–99)
Glucose-Capillary: 133 mg/dL — ABNORMAL HIGH (ref 65–99)
Glucose-Capillary: 144 mg/dL — ABNORMAL HIGH (ref 65–99)
Glucose-Capillary: 159 mg/dL — ABNORMAL HIGH (ref 65–99)

## 2016-07-30 LAB — BASIC METABOLIC PANEL
Anion gap: 7 (ref 5–15)
Anion gap: 6 (ref 5–15)
BUN: 36 mg/dL — ABNORMAL HIGH (ref 6–20)
BUN: 34 mg/dL — ABNORMAL HIGH (ref 6–20)
CO2: 30 mmol/L (ref 22–32)
CO2: 33 mmol/L — ABNORMAL HIGH (ref 22–32)
Calcium: 7.8 mg/dL — ABNORMAL LOW (ref 8.9–10.3)
Calcium: 8 mg/dL — ABNORMAL LOW (ref 8.9–10.3)
Chloride: 90 mmol/L — ABNORMAL LOW (ref 101–111)
Chloride: 90 mmol/L — ABNORMAL LOW (ref 101–111)
Creatinine, Ser: 1.01 mg/dL — ABNORMAL HIGH (ref 0.44–1.00)
Creatinine, Ser: 1.08 mg/dL — ABNORMAL HIGH (ref 0.44–1.00)
GFR calc Af Amer: >60 mL/min (ref >60)
GFR calc Af Amer: >60 mL/min (ref >60)
GFR calc non Af Amer: 60 mL/min — ABNORMAL LOW (ref >60)
GFR calc non Af Amer: 55 mL/min — ABNORMAL LOW (ref >60)
Glucose, Bld: 128 mg/dL — ABNORMAL HIGH (ref 65–99)
Glucose, Bld: 130 mg/dL — ABNORMAL HIGH (ref 65–99)
Potassium: 5.7 mmol/L — ABNORMAL HIGH (ref 3.5–5.1)
Potassium: 2.7 mmol/L — CL (ref 3.5–5.1)
Sodium: 127 mmol/L — ABNORMAL LOW (ref 135–145)
Sodium: 129 mmol/L — ABNORMAL LOW (ref 135–145)

## 2016-07-30 LAB — COMPREHENSIVE METABOLIC PANEL
ALT: 21 U/L (ref 14–54)
AST: 22 U/L (ref 15–41)
Albumin: 1.4 g/dL — ABNORMAL LOW (ref 3.5–5.0)
Alkaline Phosphatase: 90 U/L (ref 38–126)
Anion gap: 8 (ref 5–15)
BILIRUBIN TOTAL: 0.8 mg/dL (ref 0.3–1.2)
BUN: 33 mg/dL — ABNORMAL HIGH (ref 6–20)
CHLORIDE: 93 mmol/L — AB (ref 101–111)
CO2: 30 mmol/L (ref 22–32)
CREATININE: 1.07 mg/dL — AB (ref 0.44–1.00)
Calcium: 7.9 mg/dL — ABNORMAL LOW (ref 8.9–10.3)
GFR calc Af Amer: >60 mL/min (ref >60)
GFR, EST NON AFRICAN AMERICAN: 56 mL/min — AB (ref >60)
GLUCOSE: 121 mg/dL — AB (ref 65–99)
Potassium: 2.6 mmol/L — CL (ref 3.5–5.1)
Sodium: 131 mmol/L — ABNORMAL LOW (ref 135–145)
Total Protein: 5 g/dL — ABNORMAL LOW (ref 6.5–8.1)

## 2016-07-30 LAB — PHOSPHORUS: PHOSPHORUS: 4 mg/dL (ref 2.5–4.6)

## 2016-07-30 LAB — MAGNESIUM: Magnesium: 1.5 mg/dL — ABNORMAL LOW (ref 1.7–2.4)

## 2016-07-30 LAB — TRIGLYCERIDES: Triglycerides: 172 mg/dL — ABNORMAL HIGH (ref <150)

## 2016-07-30 MED ORDER — POTASSIUM CHLORIDE 10 MEQ/50ML IV SOLN
10.0000 meq | INTRAVENOUS | Status: AC
  Administered 2016-07-30 – 2016-07-31 (×3): 10 meq via INTRAVENOUS
  Filled 2016-07-30 (×3): qty 50

## 2016-07-30 MED ORDER — POTASSIUM CHLORIDE 10 MEQ/50ML IV SOLN: 10.0000 meq | INTRAVENOUS | Status: DC

## 2016-07-30 MED ORDER — MAGNESIUM SULFATE 4 GM/100ML IV SOLN
4.0000 g | Freq: Once | INTRAVENOUS | Status: AC
  Administered 2016-07-30: 4 g via INTRAVENOUS
  Filled 2016-07-30: qty 100

## 2016-07-30 MED ORDER — POTASSIUM CHLORIDE 10 MEQ/50ML IV SOLN
10.0000 meq | INTRAVENOUS | Status: AC
  Administered 2016-07-30 (×6): 10 meq via INTRAVENOUS
  Filled 2016-07-30 (×5): qty 50

## 2016-07-30 MED ORDER — POTASSIUM CHLORIDE 10 MEQ/50ML IV SOLN
10.0000 meq | INTRAVENOUS | Status: AC
  Administered 2016-07-30 (×3): 10 meq via INTRAVENOUS
  Filled 2016-07-30 (×4): qty 50

## 2016-07-30 MED ORDER — FAT EMULSION 20 % IV EMUL
240.0000 mL | INTRAVENOUS | Status: AC
  Administered 2016-07-30: 240 mL via INTRAVENOUS
  Filled 2016-07-30: qty 250

## 2016-07-30 MED ORDER — M.V.I. ADULT IV INJ
INTRAVENOUS | Status: AC
  Administered 2016-07-30: 18:00:00 via INTRAVENOUS
  Filled 2016-07-30: qty 2400

## 2016-07-30 NOTE — Progress Notes (Signed)
TCTS BRIEF SICU PROGRESS NOTE  6 Days Post-Op  S/P Procedure(s) (LRB): ARCH AND ABDOMINAL AORTA  ANGIOGRAM (N/A)   Stable day  Plan: Continue current plan  Rexene Alberts, MD 07/30/2016 6:09 PM

## 2016-07-30 NOTE — Progress Notes (Addendum)
PHARMACY - ADULT TOTAL PARENTERAL NUTRITION CONSULT NOTE   Pharmacy Consult:  TPN Indication:  Prolonged NPO status/high NG output   Patient Measurements: Height: 5\' 3"  (160 cm) Weight: 186 lb 15.2 oz (84.8 kg) IBW/kg (Calculated) : 52.4 TPN AdjBW (KG): 61.7 Body mass index is 33.12 kg/m. 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 Patient had a significant history of N/V for 2-4 weeks PTA but weight appears to have been maintained. Patient has had several NG tube replacement due to nausea and vomiting.  Pharmacy consulted to manage TPN since 06/23/16.    GI: persistent ileus and mesenteric ischemia, LGIB.   Prealbumin low at 8, BM x3 - PPI PO Endo: DM on glipizide/metformin PTA - CBGs well controlled Insulin requirements in the past 24 hours: 10 units SSI + 100 units in TPN Lytes: low Na/CL, K 2.6 post 10 runs yesterday (goal >/= 4 with ileus), Mag 1.5 (goal >/= 2 for ileus) Renal: SCr 1.07, BUN down to 33 - NS at 20ml/hr, UOP 1.4 ml/kg/hr, net +18.3L Pulm: pleural effusion, remains on 2L [place] - Brovana, Pulmicort, PRN albuterol Cards: aortic dissection stable, unsuccessful TEVAR - BP controlled, tachy - Lopressor, IV Lasix reduced to daily, metolazone added 6/26, PRN metoprolol Hepatobil: LFTs normalized, tbili WNL, TG down to 172. Neuro: Celexa, Klonopin, PRN Fentanyl/Dialudid/Xanax/APAP - pain score 0-5 ID: s/p 2 courses of Merrem for PNA, s/p 3d Cipro >> s/p 7d vanc for E.faecalis UTI. Tmax 100.8, WBC up to 13. ?need to repeat C.diff testing Best Practices: SCDs, CHG, MC TPN Access: PICC placed 06/21/16  TPN start date: 06/23/16  Nutritional Goals (per RD recommendation on 6/18): 2000-2200 kCal and 110-120 gm protein  Current Nutrition:   TPN Clear liquid diet (minimal intake) Boost BID (received 1 yesterday)    Plan:  - Continue Cinimix E 5/15 at 100 ml/hr and 20% ILE at 20 ml/hr x 12 hrs. TPN  provides 2184 kCal and 120 gm of protein per day, meeting 100% of total needs.  Consider another trial of cyclic TPN. - Daily multivitamin in TPN - Trace elements in TPN every other day d/t shortage, due 6/29 - Continue TCTS SSI Q6H per MD + 100 units regular insulin in TPN - KCL x 6 runs (total of 9 runs this morning) - Mag sulfate 4gm IV x 1 - F/U transferring to tertiary center or [facility] recommendation - BMET at 1800 and f/u AM labs    Quartez Lagos D. [name], PharmD, BCPS Pager:  [phone] 07/30/2016, 7:35 AM

## 2016-07-30 NOTE — Progress Notes (Signed)
Physical Therapy Treatment Patient Details Name: Haley Graham MRN: [id number] DOB: [birth year] Today's Date: 07/30/2016    History of Present Illness Pt adm with type B aortic dissection. Treated non surgically with BP control and pain control. Pt developed pancreatitis and ileus with NG tube placed. PMH - anxiety, arthritis    PT Comments    Patient ambulating almost whole unit this session. Pt with improved cadence. VSS. RR up to 30 at times but improved from previous sessions. Continue to progress as tolerated.    Follow Up Recommendations  Home health PT;Supervision/Assistance - 24 hour     Equipment Recommendations  Rolling walker with 5" wheels    Recommendations for Other Services       Precautions / Restrictions Precautions Precautions: Fall Restrictions Weight Bearing Restrictions: No    Mobility  Bed Mobility Overal bed mobility: Needs Assistance Bed Mobility: Rolling;Sidelying to Sit Rolling: Supervision Sidelying to sit: Min guard;HOB elevated       General bed mobility comments: min guard for safety; use of bed rail; cues for sequencing and technique  Transfers Overall transfer level: Needs assistance Equipment used: Rolling walker (2 wheeled) Transfers: Sit to/from Stand Sit to Stand: Min guard         General transfer comment: cues for safe hand placement  Ambulation/Gait Ambulation/Gait assistance: Min guard Ambulation Distance (Feet): 300 Feet Assistive device: Rolling walker (2 wheeled) Gait Pattern/deviations: Step-through pattern;Decreased stride length;Drifts right/left Gait velocity: decr   General Gait Details: RR up to 30 at times but not above; other vitals WNL; pt on RA; cues for breathing and cadence; pt increased cadence this session; encouragement to continue; one brief standing rest break leaning against wall   Stairs            Wheelchair Mobility    Modified Rankin (Stroke Patients Only)       Balance Overall  balance assessment: Needs assistance Sitting-balance support: No upper extremity supported;Feet supported Sitting balance-Leahy Scale: Good       Standing balance-Leahy Scale: Fair Standing balance comment: able to static stand without UE support                            Cognition Arousal/Alertness: Awake/alert Behavior During Therapy: Anxious;Flat affect Overall Cognitive Status: Impaired/Different from baseline Area of Impairment: Following commands;Attention;Problem solving;Awareness                   Current Attention Level: Sustained   Following Commands: Follows multi-step commands consistently;Follows multi-step commands with increased time Safety/Judgement: Decreased awareness of safety;Decreased awareness of deficits Awareness: Intellectual Problem Solving: Decreased initiation;Requires verbal cues;Slow processing        Exercises      General Comments General comments (skin integrity, edema, etc.): family present end of session      Pertinent Vitals/Pain Pain Assessment: Faces Faces Pain Scale: Hurts even more Pain Location: all over Pain Descriptors / Indicators: Aching;Grimacing Pain Intervention(s): Limited activity within patient's tolerance;Monitored during session;Premedicated before session;Repositioned    Home Living                      Prior Function            PT Goals (current goals can now be found in the care plan section) Acute Rehab PT Goals PT Goal Formulation: With patient/family Time For Goal Achievement: 08/05/16 Potential to Achieve Goals: Fair Progress towards PT goals: Progressing toward goals  Frequency    Min 3X/week      PT Plan Current plan remains appropriate    Co-evaluation              AM-PAC PT "6 Clicks" Daily Activity  Outcome Measure  Difficulty turning over in bed (including adjusting bedclothes, sheets and blankets)?: Total Difficulty moving from lying on back to  sitting on the side of the bed? : Total Difficulty sitting down on and standing up from a chair with arms (e.g., wheelchair, bedside commode, etc,.)?: Total Help needed moving to and from a bed to chair (including a wheelchair)?: A Little Help needed walking in hospital room?: A Little Help needed climbing 3-5 steps with a railing? : A Little 6 Click Score: 12    End of Session Equipment Utilized During Treatment: Gait belt Activity Tolerance: Patient tolerated treatment well Patient left: with call bell/phone within reach;with family/visitor present;in chair Nurse Communication: Mobility status PT Visit Diagnosis: Muscle weakness (generalized) (M62.81);Other abnormalities of gait and mobility (R26.89)     Time: 3912-2583 PT Time Calculation (min) (ACUTE ONLY): 42 min  Charges:  $Gait Training: 23-37 mins $Therapeutic Activity: 8-22 mins                    G Codes:       Earney Navy, PTA Pager: [phone]     Darliss Cheney 07/30/2016, 1:15 PM

## 2016-07-30 NOTE — Progress Notes (Signed)
6 Days Post-Op Procedure(s) (LRB): ARCH AND ABDOMINAL AORTA  ANGIOGRAM (N/A) Subjective: Nausea with multiple loose stools Diuresing edema well no abdominal pain this am Objective: Vital signs in last 24 hours: Temp:  [98 F (36.7 C)-100.8 F (38.2 C)] 98.7 F (37.1 C) (06/28 0700) Pulse Rate:  [96-107] 103 (06/28 0700) Cardiac Rhythm: Sinus tachycardia (06/28 0400) Resp:  [19-31] 19 (06/28 0700) BP: (100-125)/(41-59) 122/53 (06/28 0700) SpO2:  [90 %-98 %] 96 % (06/28 0700) Weight:  [186 lb 15.2 oz (84.8 kg)] 186 lb 15.2 oz (84.8 kg) (06/28 0500)  Hemodynamic parameters for last 24 hours:   nsr Intake/Output from previous day: 06/27 0701 - 06/28 0700 In: 3690 [P.O.:100; I.V.:2890; IV Piggyback:700] Out: 2750 [Urine:2750] Intake/Output this shift: No intake/output data recorded.       Exam    General- chronically ill appearing   Lungs- clear without rales, wheezes   Cor- regular rate and rhythm, no murmur , gallop   Abdomen- distended butnon-tender   Extremities - warm, non-tender, minimal edema   Neuro- oriented, appropriate, no focal weakness   Lab Results:  Recent Labs  07/29/16 0325 07/30/16 0354  WBC 12.1* 13.0*  HGB 8.6* 9.0*  HCT 24.9* 27.4*  PLT 156 181   BMET:  Recent Labs  07/29/16 0325 07/30/16 0354  NA 131* 131*  K 2.4* 2.6*  CL 98* 93*  CO2 27 30  GLUCOSE 123* 121*  BUN 38* 33*  CREATININE 1.15* 1.07*  CALCIUM 7.8* 7.9*    PT/INR:  Recent Labs  07/29/16 0325  LABPROT 14.0  INR 1.08   ABG    Component Value Date/Time   PHART 7.430 06/28/2016 1615   HCO3 20.7 06/28/2016 1615   TCO2 22 06/28/2016 1615   ACIDBASEDEF 3.0 (H) 06/28/2016 1615   O2SAT 93.0 06/28/2016 1615   CBG (last 3)   Recent Labs  07/29/16 1758 07/29/16 2323 07/30/16 0552  GLUCAP 129* 145* 144*    Assessment/Plan: S/P Procedure(s) (LRB): ARCH AND ABDOMINAL AORTA  ANGIOGRAM (N/A) Check c-diff Lasix Q am supp K  LOS: 40 days    Haley Graham  III 07/30/2016

## 2016-07-30 NOTE — Progress Notes (Signed)
CRITICAL VALUE ALERT  Critical Value: Potassium 2.6   Date & Time Notied:  07/30/2016 at [place]  Provider Notified: No MD notified. Replacing K+ with standing orders.   Orders Received/Actions taken: 3 runs of K+ ordered per standing orders.

## 2016-07-30 NOTE — Progress Notes (Signed)
[place] NOTE   Pharmacy Consult for TPN Indication: Prolonged NPO status & high NG output  BMP with K 5.7 this PM after K-runs x 9 today, up from 2.6 this AM (after K-runs x 10 yesterday).  No hemolysis.  Other labs appeared c/w this AM's values, however large jump in K was not consistent with continued need for K-runs.    Stat repeat with K 2.7, which seems more consistent with previous values and changes with supplementation  Plan: K-runs x 3 F/U AM labs   Lewie Chamber., PharmD Clinical Pharmacist [facility]

## 2016-07-31 LAB — BASIC METABOLIC PANEL
Anion gap: 8 (ref 5–15)
Anion gap: 9 (ref 5–15)
BUN: 33 mg/dL — ABNORMAL HIGH (ref 6–20)
BUN: 34 mg/dL — ABNORMAL HIGH (ref 6–20)
CO2: 31 mmol/L (ref 22–32)
CO2: 28 mmol/L (ref 22–32)
Calcium: 7.9 mg/dL — ABNORMAL LOW (ref 8.9–10.3)
Calcium: 7.3 mg/dL — ABNORMAL LOW (ref 8.9–10.3)
Chloride: 88 mmol/L — ABNORMAL LOW (ref 101–111)
Chloride: 90 mmol/L — ABNORMAL LOW (ref 101–111)
Creatinine, Ser: 1.04 mg/dL — ABNORMAL HIGH (ref 0.44–1.00)
Creatinine, Ser: 0.98 mg/dL (ref 0.44–1.00)
GFR calc Af Amer: >60 mL/min (ref >60)
GFR calc Af Amer: >60 mL/min (ref >60)
GFR calc non Af Amer: 58 mL/min — ABNORMAL LOW (ref >60)
GFR calc non Af Amer: >60 mL/min (ref >60)
Glucose, Bld: 124 mg/dL — ABNORMAL HIGH (ref 65–99)
Glucose, Bld: 98 mg/dL (ref 65–99)
Potassium: 2.9 mmol/L — ABNORMAL LOW (ref 3.5–5.1)
Potassium: 3 mmol/L — ABNORMAL LOW (ref 3.5–5.1)
Sodium: 127 mmol/L — ABNORMAL LOW (ref 135–145)
Sodium: 127 mmol/L — ABNORMAL LOW (ref 135–145)

## 2016-07-31 LAB — MAGNESIUM: Magnesium: 1.9 mg/dL (ref 1.7–2.4)

## 2016-07-31 LAB — GLUCOSE, CAPILLARY
Glucose-Capillary: 141 mg/dL — ABNORMAL HIGH (ref 65–99)
Glucose-Capillary: 139 mg/dL — ABNORMAL HIGH (ref 65–99)
Glucose-Capillary: 140 mg/dL — ABNORMAL HIGH (ref 65–99)
Glucose-Capillary: 117 mg/dL — ABNORMAL HIGH (ref 65–99)

## 2016-07-31 LAB — C DIFFICILE QUICK SCREEN W PCR REFLEX
C Diff antigen: NEGATIVE
C Diff interpretation: NOT DETECTED
C Diff toxin: NEGATIVE

## 2016-07-31 MED ORDER — TRACE MINERALS CR-CU-MN-SE-ZN 10-1000-500-60 MCG/ML IV SOLN
INTRAVENOUS | Status: AC
  Administered 2016-07-31: 18:00:00 via INTRAVENOUS
  Filled 2016-07-31: qty 2400

## 2016-07-31 MED ORDER — POTASSIUM CHLORIDE 10 MEQ/50ML IV SOLN
10.0000 meq | INTRAVENOUS | Status: AC
  Administered 2016-07-31 – 2016-08-01 (×6): 10 meq via INTRAVENOUS
  Filled 2016-07-31: qty 50

## 2016-07-31 MED ORDER — POTASSIUM CHLORIDE 10 MEQ/50ML IV SOLN
10.0000 meq | INTRAVENOUS | Status: AC
  Administered 2016-07-31 (×6): 10 meq via INTRAVENOUS
  Filled 2016-07-31 (×6): qty 50

## 2016-07-31 MED ORDER — MAGNESIUM SULFATE 2 GM/50ML IV SOLN
2.0000 g | Freq: Once | INTRAVENOUS | Status: AC
  Administered 2016-07-31: 2 g via INTRAVENOUS
  Filled 2016-07-31: qty 50

## 2016-07-31 MED ORDER — FAT EMULSION 20 % IV EMUL
240.0000 mL | INTRAVENOUS | Status: AC
  Administered 2016-07-31: 240 mL via INTRAVENOUS
  Filled 2016-07-31: qty 250

## 2016-07-31 NOTE — Progress Notes (Signed)
TCTS DAILY ICU PROGRESS NOTE                   Haley Graham            Haley Graham,Haley Graham          [phone]   7 Days Post-Op Procedure(s) (LRB): ARCH AND ABDOMINAL AORTA  ANGIOGRAM (N/A)  Total Length of Stay:  LOS: 41 days   Subjective: Patient with complaints of abdominal pain (which she has had). She continues with nausea and loose stools.  Objective: Vital signs in last 24 hours: Temp:  [98.3 F (36.8 C)-99.1 F (37.3 C)] 98.3 F (36.8 C) (06/29 0830) Pulse Rate:  [93-119] 104 (06/29 1000) Cardiac Rhythm: Sinus tachycardia (06/29 1000) Resp:  [19-27] 23 (06/29 1000) BP: (94-127)/(38-79) 113/68 (06/29 1000) SpO2:  [87 %-100 %] 91 % (06/29 1000) Weight:  [85.2 kg (187 lb 13.3 oz)] 85.2 kg (187 lb 13.3 oz) (06/29 0600)  Filed Weights   07/29/16 0500 07/30/16 0500 07/31/16 0600  Weight: 87.6 kg (193 lb 2 oz) 84.8 kg (186 lb 15.2 oz) 85.2 kg (187 lb 13.3 oz)      Intake/Output from previous day: 06/28 0701 - 06/29 0700 In: 4160.3 [P.O.:1137; I.V.:2623.3; IV Piggyback:400] Out: 2150 [Urine:2150]  Intake/Output this shift: Total I/O In: 710 [P.O.:120; I.V.:440; IV Piggyback:150] Out: 350 [Urine:350]  Current Meds: Scheduled Meds: . arformoterol  15 mcg Nebulization BID  . budesonide (PULMICORT) nebulizer solution  0.5 mg Nebulization BID  . chlorhexidine  15 mL Mouth Rinse BID  . Chlorhexidine Gluconate Cloth  6 each Topical Q0600  . citalopram  40 mg Oral Daily  . clonazePAM  1 mg Oral QHS  . feeding supplement  1 Container Oral BID BM  . furosemide  40 mg Intravenous Daily  . insulin aspart  0-24 Units Subcutaneous Q6H  . magic mouthwash  5 mL Oral QID  . mouth rinse  15 mL Mouth Rinse q12n4p  . metoprolol tartrate  25 mg Oral BID  . pantoprazole  40 mg Oral Q1200  . sodium chloride flush  10-40 mL Intracatheter Q12H   Continuous Infusions: . sodium chloride    . sodium chloride 10 mL/hr at 07/31/16 1100  . potassium chloride 10 mEq  (07/31/16 1048)  . Marland KitchenTPN (CLINIMIX-E) Adult 100 mL/hr at 07/31/16 1100   PRN Meds:.Place/Maintain arterial line **AND** sodium chloride, acetaminophen **OR** acetaminophen, ALPRAZolam, fentaNYL (SUBLIMAZE) injection, Gerhardt's butt cream, HYDROmorphone, labetalol, ondansetron (ZOFRAN) IV, phenol, pneumococcal 23 valent vaccine, simethicone, sodium chloride flush  Heart: Slightly tachycardic Lungs: Clear to auscultation bilaterally Abdomen: Soft, distended, diffusely tender mostly across lower abdomen, rare bowel sounds Extremities: LE edema  Lab Results: CBC:  Recent Labs  07/29/16 0325 07/30/16 0354  WBC 12.1* 13.0*  HGB 8.6* 9.0*  HCT 24.9* 27.4*  PLT 156 181   BMET:   Recent Labs  07/30/16 2027 07/31/16 0417  NA 129* 127*  K 2.7* 2.9*  CL 90* 88*  CO2 33* 31  GLUCOSE 130* 124*  BUN 34* 33*  CREATININE 1.08* 1.04*  CALCIUM 8.0* 7.9*    CMET: Lab Results  Component Value Date   WBC 13.0 (H) 07/30/2016   HGB 9.0 (L) 07/30/2016   HCT 27.4 (L) 07/30/2016   PLT 181 07/30/2016   GLUCOSE 124 (H) 07/31/2016   TRIG 172 (H) 07/30/2016   ALT 21 07/30/2016   AST 22 07/30/2016   NA 127 (L) 07/31/2016   K 2.9 (L) 07/31/2016  CL 88 (L) 07/31/2016   CREATININE 1.04 (H) 07/31/2016   BUN 33 (H) 07/31/2016   CO2 31 07/31/2016   INR 1.08 07/29/2016      PT/INR:   Recent Labs  07/29/16 0325  LABPROT 14.0  INR 1.08   Radiology: No results found.   Assessment/Plan: 1. CV-Slightly tachycardic this am with HR in the low 100's. SBP well controlled as 120's or less. Continue Lopressor 25 mg bid. 2. Pulmonary-On 2 liters of oxygen via [place]. Continue Pulmicort and Brovana nebs. 3. 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 On sips of clear liquids (not taking much) and TPN. Will check BMET in am. 4. Edema secondary to low albumin-As discussed with Dr. Karie Fetch, will continue Lasix 40 mg daily (stop pm dose of Lasix) and  Metazolone 5 mg daily.  5. DM-CBGs 167/141/129. Continue Insulin. 6. Anemia-H and H this am up to 8.6 and 24.9 (s/p transfusion yesterday). She is NOT on ecasa or Lovenox. 7. Supplement potassium 9. Intromittent fever and last WBC 13,000. She has been treated for a UTI as well as possible PNA. She has had intestinal angina as well. 10. C. Dif is NEGATIVE 11. Awaiting transfer to [facility], early next week  ZIMMERMAN,Haley Graham 07/31/2016 11:02 AM

## 2016-07-31 NOTE — Progress Notes (Signed)
PHARMACY - ADULT TOTAL PARENTERAL NUTRITION CONSULT NOTE   Pharmacy Consult:  TPN Indication:  Prolonged NPO status/high NG output   Repeat K+ is 3 s/p 6 KCl runs today, last one started at 14:56. Mg 1.9 this am - given 2 g.  Will give another 6 KCl runs tonight and f/u BMET in am.  Renold Genta, PharmD, BCPS Clinical Pharmacist Phone for tonight - [place] - [phone] 07/31/2016 7:15 PM

## 2016-07-31 NOTE — Progress Notes (Signed)
PHARMACY - ADULT TOTAL PARENTERAL NUTRITION CONSULT NOTE   Pharmacy Consult:  TPN Indication:  Prolonged NPO status/high NG output   Patient Measurements: Height: 5\' 3"  (160 cm) Weight: 187 lb 13.3 oz (85.2 kg) IBW/kg (Calculated) : 52.4 TPN AdjBW (KG): 61.7 Body mass index is 33.27 kg/m. 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 Patient had a significant history of N/V for 2-4 weeks PTA but weight appears to have been maintained. Patient has had several NG tube replacement due to nausea and vomiting.  Pharmacy consulted to manage TPN since 06/23/16.    GI: persistent ileus and mesenteric ischemia, LGIB.   Prealbumin low at 8, multiple loose stools with CDiff neg on 6/29 - PPI PO Endo: DM on glipizide/metformin PTA - CBGs well controlled Insulin requirements in the past 24 hours: 10 units SSI + 100 units in TPN Lytes: low Na/CL, K 2.9 post 12 runs on 6/28 (goal >/= 4 with ileus), Mag 1.9 s/p 4g on 6/28 (goal >/= 2 for ileus).  On Lasix & Metolazone daily Renal:  Cr 1.04, BUN down to 33 - NS at 44ml/hr, UOP 1.1 ml/kg/hr, net +2L Pulm: pleural effusion, remains on 2L [place] - Brovana, Pulmicort, PRN albuterol Cards: aortic dissection stable, unsuccessful TEVAR - BP controlled, tachy - Lopressor, IV Lasix reduced to daily, metolazone added 6/26, PRN metoprolol Hepatobil: LFTs normalized, tbili WNL, TG down to 172. Neuro: Celexa, Klonopin, PRN Fentanyl/Dialudid/Xanax/APAP - pain score 0-5 ID: s/p 2 courses of Merrem for PNA, s/p 3d Cipro >> s/p 7d vanc for E.faecalis UTI. Tmax 100.8, WBC up to 13. ?need to repeat C.diff testing Best Practices: SCDs, CHG, MC TPN Access: PICC placed 06/21/16  TPN start date: 06/23/16  Nutritional Goals (per RD recommendation on 6/18): 2000-2200 kCal and 110-120 gm protein  Current Nutrition:   TPN Clear liquid diet (minimal intake) Boost BID (received 1 yesterday)    Plan:   - Continue Cinimix E 5/15 at 100 ml/hr and 20% ILE at 20 ml/hr x 12 hrs. TPN provides 2184 kCal and 120 gm of protein per day, meeting 100% of total needs.  Consider another trial of cyclic TPN. - Daily multivitamin in TPN - Trace elements in TPN every other day d/t shortage, due 6/29 - Continue TCTS SSI Q6H per MD + 100 units regular insulin in TPN - KCL x 6 runs - Mag sulfate 2gm IV x 1 - F/U transferring to tertiary center or [facility] recommendation - BMET at 1800 and f/u AM labs

## 2016-08-01 DIAGNOSIS — I712 Thoracic aortic aneurysm, without rupture: Secondary | ICD-10-CM

## 2016-08-01 LAB — BPAM RBC
Blood Product Expiration Date: 201807202359
Blood Product Expiration Date: 201807202359
ISSUE DATE / TIME: 201806262252
Unit Type and Rh: 5100
Unit Type and Rh: 5100

## 2016-08-01 LAB — CBC
HCT: 27.6 % — ABNORMAL LOW (ref 36.0–46.0)
Hemoglobin: 8.9 g/dL — ABNORMAL LOW (ref 12.0–15.0)
MCH: 29.9 pg (ref 26.0–34.0)
MCHC: 32.2 g/dL (ref 30.0–36.0)
MCV: 92.6 fL (ref 78.0–100.0)
Platelets: 203 10*3/uL (ref 150–400)
RBC: 2.98 MIL/uL — ABNORMAL LOW (ref 3.87–5.11)
RDW: 16.3 % — AB (ref 11.5–15.5)
WBC: 15.6 10*3/uL — AB (ref 4.0–10.5)

## 2016-08-01 LAB — TYPE AND SCREEN
ABO/RH(D): O POS
Antibody Screen: POSITIVE
DAT, IgG: POSITIVE
Unit division: 0
Unit division: 0

## 2016-08-01 LAB — MAGNESIUM: Magnesium: 1.7 mg/dL (ref 1.7–2.4)

## 2016-08-01 LAB — BASIC METABOLIC PANEL
Anion gap: 8 (ref 5–15)
BUN: 35 mg/dL — ABNORMAL HIGH (ref 6–20)
CO2: 32 mmol/L (ref 22–32)
Calcium: 7.8 mg/dL — ABNORMAL LOW (ref 8.9–10.3)
Chloride: 87 mmol/L — ABNORMAL LOW (ref 101–111)
Creatinine, Ser: 1.11 mg/dL — ABNORMAL HIGH (ref 0.44–1.00)
GFR calc Af Amer: >60 mL/min (ref >60)
GFR calc non Af Amer: 53 mL/min — ABNORMAL LOW (ref >60)
Glucose, Bld: 204 mg/dL — ABNORMAL HIGH (ref 65–99)
Potassium: 3.6 mmol/L (ref 3.5–5.1)
Sodium: 127 mmol/L — ABNORMAL LOW (ref 135–145)

## 2016-08-01 LAB — GLUCOSE, CAPILLARY
Glucose-Capillary: 209 mg/dL — ABNORMAL HIGH (ref 65–99)
Glucose-Capillary: 246 mg/dL — ABNORMAL HIGH (ref 65–99)
Glucose-Capillary: 227 mg/dL — ABNORMAL HIGH (ref 65–99)
Glucose-Capillary: 104 mg/dL — ABNORMAL HIGH (ref 65–99)

## 2016-08-01 MED ORDER — POTASSIUM CHLORIDE 10 MEQ/50ML IV SOLN
10.0000 meq | INTRAVENOUS | Status: AC
  Administered 2016-08-01 (×6): 10 meq via INTRAVENOUS
  Filled 2016-08-01 (×5): qty 50

## 2016-08-01 MED ORDER — MAGNESIUM SULFATE 4 GM/100ML IV SOLN
4.0000 g | Freq: Once | INTRAVENOUS | Status: AC
  Administered 2016-08-01: 4 g via INTRAVENOUS
  Filled 2016-08-01: qty 100

## 2016-08-01 MED ORDER — M.V.I. ADULT IV INJ
INTRAVENOUS | Status: AC
  Administered 2016-08-01: 18:00:00 via INTRAVENOUS
  Filled 2016-08-01: qty 2400

## 2016-08-01 MED ORDER — POTASSIUM CHLORIDE 10 MEQ/50ML IV SOLN
INTRAVENOUS | Status: AC
  Filled 2016-08-01: qty 50

## 2016-08-01 MED ORDER — FAT EMULSION 20 % IV EMUL
240.0000 mL | INTRAVENOUS | Status: AC
  Administered 2016-08-01: 240 mL via INTRAVENOUS
  Filled 2016-08-01: qty 250

## 2016-08-01 NOTE — Progress Notes (Signed)
Ambulated 317ft.  Spo2=84% on 6LNC.  Dr Cyndia Bent at bedside doing rounds and aware.  50%VM applied.  Pt sitting straight up in chair.  Will continue to monitor closely.

## 2016-08-01 NOTE — Progress Notes (Signed)
PHARMACY - ADULT TOTAL PARENTERAL NUTRITION CONSULT NOTE   Pharmacy Consult:  TPN Indication:  Prolonged NPO status/high NG output   Patient Measurements: Height: 5\' 3"  (160 cm) Weight: 182 lb 1.6 oz (82.6 kg) IBW/kg (Calculated) : 52.4 TPN AdjBW (KG): 61.7 Body mass index is 32.26 kg/m. 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 Patient had a significant history of N/V for 2-4 weeks PTA but weight appears to have been maintained. Patient has had several NG tube replacement due to nausea and vomiting.  Pharmacy consulted to manage TPN since 06/23/16.    GI: persistent ileus and mesenteric ischemia, LGIB.   Prealbumin low at 8, multiple loose stools with CDiff neg on 6/29 - PPI PO Endo: DM on glipizide/metformin PTA - CBGs 117-249.  No insulin in 6/29 tpn, reflected in increased cbg's & have notified RN of this as no MD has seen yet.  Will resume today. Insulin requirements in the past 24 hours: 22 units SSI Lytes: low Na/CL stable, K 3.6 post 12 runs on 6/29 (goal >/= 4 with ileus), Mag 1.7 s/p 2g on 6/29 (goal >/= 2 for ileus).  On Lasix & Metolazone daily Renal:  Cr 1.04, BUN down to 33 - NS at 2ml/hr, UOP 1.1 ml/kg/hr, net +2L Pulm: pleural effusion, 3L [place] - Brovana, Pulmicort, PRN albuterol Cards: aortic dissection stable, unsuccessful TEVAR - BP controlled, tachy - Lopressor, IV Lasix reduced to daily, metolazone added 6/26, PRN metoprolol Hepatobil: LFTs normalized, tbili WNL, TG down to 172. Neuro: Celexa, Klonopin, PRN Fentanyl/Dialudid/Xanax/APAP - pain score 0-5 ID: s/p 2 courses of Merrem for PNA, s/p 3d Cipro >> s/p 7d vanc for E.faecalis UTI. Tmax 100.8, WBC up to 13. ?need to repeat C.diff testing Best Practices: SCDs, CHG, MC TPN Access: PICC placed 06/21/16  TPN start date: 06/23/16  Nutritional Goals (per RD recommendation on 6/18): 2000-2200 kCal and 110-120 gm protein  Current  Nutrition:   TPN Clear liquid diet (minimal intake) Boost BID (received 1 yesterday)    Plan:  - Continue Cinimix E 5/15 at 100 ml/hr and 20% ILE at 20 ml/hr x 12 hrs. TPN provides 2184 kCal and 120 gm of protein per day, meeting 100% of total needs.  - Add Insulin 100 units to TPN - Daily multivitamin in TPN - Trace elements in TPN every other day d/t shortage, due 6/29 - Continue TCTS SSI Q6H per MD + 100 units regular insulin in TPN - KCL x 6 runs to maintain - Mag sulfate 4gm IV x 1 - F/U transferring to tertiary center or [place] recommendation - F/U AM labs   Lewie Chamber., PharmD Clinical Pharmacist [facility]

## 2016-08-01 NOTE — Progress Notes (Signed)
8 Days Post-Op Procedure(s) (LRB): ARCH AND ABDOMINAL AORTA  ANGIOGRAM (N/A) Subjective:  Doesn't say much. Her husband says he thinks she feels a little better today. She just ambulated around the ICU and reports shortness of breath. Sats 80's on 6L.  Taking a little clear liquids. Bowels working.  Objective: Vital signs in last 24 hours: Temp:  [97.8 F (36.6 C)-98.5 F (36.9 C)] 97.8 F (36.6 C) (06/30 1246) Pulse Rate:  [90-111] 100 (06/30 1600) Cardiac Rhythm: Normal sinus rhythm (06/30 1606) Resp:  [18-26] 23 (06/30 1600) BP: (91-117)/(44-59) 107/55 (06/30 1600) SpO2:  [87 %-97 %] 92 % (06/30 1600) Weight:  [82.6 kg (182 lb 1.6 oz)] 82.6 kg (182 lb 1.6 oz) (06/30 0600)  Hemodynamic parameters for last 24 hours:    Intake/Output from previous day: 06/29 0701 - 06/30 0700 In: 3767.3 [P.O.:240; I.V.:2877.3; IV Piggyback:650] Out: 2700 [Urine:2700] Intake/Output this shift: Total I/O In: 1320 [P.O.:110; I.V.:960; IV Piggyback:250] Out: 1950 [ITGPQ:9826]  General appearance: alert, cooperative and uncomfortable Heart: regular rate and rhythm, S1, S2 normal, no murmur, click, rub or gallop Lungs: diminished breath sounds bibasilar Abdomen: distended, non-tender, hypoactive BS Extremities: edema mild  Lab Results:  Recent Labs  07/30/16 0354 08/01/16 0532  WBC 13.0* 15.6*  HGB 9.0* 8.9*  HCT 27.4* 27.6*  PLT 181 203   BMET:  Recent Labs  07/31/16 1718 08/01/16 0532  NA 127* 127*  K 3.0* 3.6  CL 90* 87*  CO2 28 32  GLUCOSE 98 204*  BUN 34* 35*  CREATININE 0.98 1.11*  CALCIUM 7.3* 7.8*    PT/INR: No results for input(s): LABPROT, INR in the last 72 hours. 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 Continue TNA and clear liquids.  Hemodynamically stable.  Atelectasis on exam. Will repeat CXR in am. Continue ambulation and IS.  Await decision about another attempt at stent graft.    LOS: 42 days    Gaye Pollack 08/01/2016

## 2016-08-02 ENCOUNTER — Inpatient Hospital Stay (HOSPITAL_COMMUNITY): Payer: BLUE CROSS/BLUE SHIELD

## 2016-08-02 LAB — BASIC METABOLIC PANEL
Anion gap: 10 (ref 5–15)
Anion gap: 9 (ref 5–15)
BUN: 34 mg/dL — ABNORMAL HIGH (ref 6–20)
BUN: 32 mg/dL — ABNORMAL HIGH (ref 6–20)
CO2: 33 mmol/L — ABNORMAL HIGH (ref 22–32)
CO2: 34 mmol/L — ABNORMAL HIGH (ref 22–32)
Calcium: 7.9 mg/dL — ABNORMAL LOW (ref 8.9–10.3)
Calcium: 7.7 mg/dL — ABNORMAL LOW (ref 8.9–10.3)
Chloride: 86 mmol/L — ABNORMAL LOW (ref 101–111)
Chloride: 86 mmol/L — ABNORMAL LOW (ref 101–111)
Creatinine, Ser: 1.03 mg/dL — ABNORMAL HIGH (ref 0.44–1.00)
Creatinine, Ser: 1.01 mg/dL — ABNORMAL HIGH (ref 0.44–1.00)
GFR calc Af Amer: >60 mL/min (ref >60)
GFR calc Af Amer: >60 mL/min (ref >60)
GFR calc non Af Amer: 58 mL/min — ABNORMAL LOW (ref >60)
GFR calc non Af Amer: 60 mL/min — ABNORMAL LOW (ref >60)
Glucose, Bld: 116 mg/dL — ABNORMAL HIGH (ref 65–99)
Glucose, Bld: 122 mg/dL — ABNORMAL HIGH (ref 65–99)
Potassium: 2.9 mmol/L — ABNORMAL LOW (ref 3.5–5.1)
Potassium: 3.2 mmol/L — ABNORMAL LOW (ref 3.5–5.1)
Sodium: 129 mmol/L — ABNORMAL LOW (ref 135–145)
Sodium: 129 mmol/L — ABNORMAL LOW (ref 135–145)

## 2016-08-02 LAB — GLUCOSE, CAPILLARY
Glucose-Capillary: 119 mg/dL — ABNORMAL HIGH (ref 65–99)
Glucose-Capillary: 128 mg/dL — ABNORMAL HIGH (ref 65–99)
Glucose-Capillary: 123 mg/dL — ABNORMAL HIGH (ref 65–99)
Glucose-Capillary: 123 mg/dL — ABNORMAL HIGH (ref 65–99)
Glucose-Capillary: 149 mg/dL — ABNORMAL HIGH (ref 65–99)

## 2016-08-02 LAB — MAGNESIUM: Magnesium: 1.9 mg/dL (ref 1.7–2.4)

## 2016-08-02 MED ORDER — MAGNESIUM SULFATE 4 GM/100ML IV SOLN
4.0000 g | Freq: Once | INTRAVENOUS | Status: AC
  Administered 2016-08-02: 4 g via INTRAVENOUS
  Filled 2016-08-02: qty 100

## 2016-08-02 MED ORDER — POTASSIUM CHLORIDE 10 MEQ/50ML IV SOLN
10.0000 meq | INTRAVENOUS | Status: AC
  Administered 2016-08-02 (×6): 10 meq via INTRAVENOUS
  Filled 2016-08-02 (×4): qty 50

## 2016-08-02 MED ORDER — POTASSIUM CHLORIDE 10 MEQ/50ML IV SOLN
10.0000 meq | INTRAVENOUS | Status: AC
  Administered 2016-08-02 (×3): 10 meq via INTRAVENOUS
  Filled 2016-08-02 (×2): qty 50

## 2016-08-02 MED ORDER — TRACE MINERALS CR-CU-MN-SE-ZN 10-1000-500-60 MCG/ML IV SOLN
INTRAVENOUS | Status: AC
  Administered 2016-08-02: 17:00:00 via INTRAVENOUS
  Filled 2016-08-02: qty 2400

## 2016-08-02 MED ORDER — FAT EMULSION 20 % IV EMUL
240.0000 mL | INTRAVENOUS | Status: AC
  Administered 2016-08-02: 240 mL via INTRAVENOUS
  Filled 2016-08-02: qty 250

## 2016-08-02 MED ORDER — POTASSIUM CHLORIDE 10 MEQ/50ML IV SOLN
10.0000 meq | INTRAVENOUS | Status: AC
  Administered 2016-08-02 – 2016-08-03 (×4): 10 meq via INTRAVENOUS
  Filled 2016-08-02 (×4): qty 50

## 2016-08-02 NOTE — Progress Notes (Signed)
9 Days Post-Op Procedure(s) (LRB): ARCH AND ABDOMINAL AORTA  ANGIOGRAM (N/A) Subjective: Feels about the same today. Bowels moving.  Objective: Vital signs in last 24 hours: Temp:  [97.4 F (36.3 C)-98.1 F (36.7 C)] 97.4 F (36.3 C) (07/01 1235) Pulse Rate:  [87-109] 92 (07/01 1500) Cardiac Rhythm: Normal sinus rhythm (07/01 1241) Resp:  [17-30] 20 (07/01 1500) BP: (89-125)/(47-62) 125/59 (07/01 1400) SpO2:  [86 %-98 %] 95 % (07/01 1500) FiO2 (%):  [50 %] 50 % (06/30 1700) Weight:  [81.3 kg (179 lb 3.7 oz)] 81.3 kg (179 lb 3.7 oz) (07/01 0300)  Hemodynamic parameters for last 24 hours:    Intake/Output from previous day: 06/30 0701 - 07/01 0700 In: 2614 [P.O.:110; I.V.:2054; IV Piggyback:450] Out: 9509 [Urine:3650; Stool:1] Intake/Output this shift: Total I/O In: 1230 [I.V.:880; IV Piggyback:350] Out: 900 [Urine:900]  General appearance: anxious and uncomfortable Heart: regular rate and rhythm, S1, S2 normal, no murmur, click, rub or gallop Lungs: diminished breath sounds bibasilar Abdomen: few BS, distended, nontender  Lab Results:  Recent Labs  08/01/16 0532  WBC 15.6*  HGB 8.9*  HCT 27.6*  PLT 203   BMET:  Recent Labs  08/01/16 0532 08/02/16 0420  NA 127* 129*  K 3.6 2.9*  CL 87* 86*  CO2 32 33*  GLUCOSE 204* 116*  BUN 35* 34*  CREATININE 1.11* 1.03*  CALCIUM 7.8* 7.9*    PT/INR: No results for input(s): LABPROT, INR in the last 72 hours. 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 Continue TNA and clear liquids.  Hemodynamically stable.  Atelectasis on exam and CXR shows LLL atelectasis  that is unchanged from prior CXR. Continue IS and ambulation  Await decision about another attempt at stent graft.   LOS: 43 days    Gaye Pollack 08/02/2016

## 2016-08-02 NOTE — Plan of Care (Signed)
Problem: Activity: Goal: Risk for activity intolerance will decrease Outcome: Progressing Patient is able to sit up and get out of bed with minimal assistance

## 2016-08-02 NOTE — Progress Notes (Signed)
PHARMACY - ADULT TOTAL PARENTERAL NUTRITION CONSULT NOTE   Pharmacy Consult:  TPN Indication:  Prolonged NPO status/high NG output   Patient Measurements: Height: 5\' 3"  (160 cm) Weight: 179 lb 3.7 oz (81.3 kg) IBW/kg (Calculated) : 52.4 TPN AdjBW (KG): 61.7 Body mass index is 31.75 kg/m. 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 Patient had a significant history of N/V for 2-4 weeks PTA but weight appears to have been maintained. Patient has had several NG tube replacement due to nausea and vomiting.  Pharmacy consulted to manage TPN since 06/23/16.    GI: persistent ileus and mesenteric ischemia, LGIB.   Prealbumin low at 8, multiple loose stools with CDiff neg on 6/29 - PPI PO Endo: DM on glipizide/metformin PTA - CBGs up on Saturday as no insulin in TPN (szp completed).  CBGs within goal today. Insulin requirements in the past 24 hours: 22 units SSI + 100 units in TPN Lytes: low Na/CL stable, K 2.9 post 11 runs on 6/30 (goal >/= 4 with ileus), Mag 1.9 s/p 4g on 6/30 (goal >/= 2 for ileus).  On Lasix daily Renal:  Cr 1.04, BUN down to 33 - NS at 57ml/hr, UOP 1.1 ml/kg/hr, net +2L Pulm: pleural effusion, 4L [place] - Brovana, Pulmicort, PRN albuterol Cards: aortic dissection stable, unsuccessful TEVAR - BP controlled, tachy - Lopressor, IV Lasix reduced to daily, metolazone added 6/26, PRN metoprolol Hepatobil: LFTs normalized, tbili WNL, TG down to 172. Neuro: Celexa, Klonopin, PRN Fentanyl/Dialudid/Xanax/APAP - pain score 0-5 ID: s/p 2 courses of Merrem for PNA, s/p 3d Cipro >> s/p 7d vanc for E.faecalis UTI. Tmax 100.8, WBC up to 13. ?need to repeat C.diff testing  Best Practices: SCDs, CHG, MC TPN Access: PICC placed 06/21/16  TPN start date: 06/23/16  Nutritional Goals (per RD recommendation on 6/18): 2000-2200 kCal and 110-120 gm protein  Current Nutrition:   TPN Clear liquid diet (tolerating but  little intake)    Plan:  - Continue Cinimix E 5/15 at 100 ml/hr and 20% ILE at 20 ml/hr x 12 hrs. TPN provides 2184 kCal and 120 gm of protein per day, meeting 100% of total needs.  - Add Insulin 100 units to TPN - Daily multivitamin in TPN - Trace elements in TPN every other day d/t shortage, due 7/1 - Continue TCTS SSI Q6H per MD - KCL x 9 runs total (3 runs ordered per MD & added 6 runs) - Mag sulfate 4gm IV x 1 - F/U transferring to tertiary center or [facility] recommendation - Repeat BMP at 6PM & F/U AM labs   Lewie Chamber., PharmD Clinical Pharmacist [facility]

## 2016-08-03 ENCOUNTER — Inpatient Hospital Stay (HOSPITAL_COMMUNITY): Payer: BLUE CROSS/BLUE SHIELD

## 2016-08-03 LAB — COMPREHENSIVE METABOLIC PANEL
ALK PHOS: 93 U/L (ref 38–126)
ALT: 13 U/L — AB (ref 14–54)
AST: 20 U/L (ref 15–41)
Albumin: 1.4 g/dL — ABNORMAL LOW (ref 3.5–5.0)
Anion gap: 5 (ref 5–15)
BUN: 32 mg/dL — AB (ref 6–20)
CALCIUM: 7.6 mg/dL — AB (ref 8.9–10.3)
CHLORIDE: 89 mmol/L — AB (ref 101–111)
CO2: 35 mmol/L — AB (ref 22–32)
CREATININE: 0.99 mg/dL (ref 0.44–1.00)
GFR calc Af Amer: >60 mL/min (ref >60)
GFR calc non Af Amer: >60 mL/min (ref >60)
Glucose, Bld: 110 mg/dL — ABNORMAL HIGH (ref 65–99)
Potassium: 3.5 mmol/L (ref 3.5–5.1)
SODIUM: 129 mmol/L — AB (ref 135–145)
Total Bilirubin: 0.9 mg/dL (ref 0.3–1.2)
Total Protein: 5.3 g/dL — ABNORMAL LOW (ref 6.5–8.1)

## 2016-08-03 LAB — DIFFERENTIAL
BAND NEUTROPHILS: 16 %
BLASTS: 0 %
Basophils Absolute: 0 10*3/uL (ref 0.0–0.1)
Basophils Relative: 0 %
EOS PCT: 2 %
Eosinophils Absolute: 0.4 10*3/uL (ref 0.0–0.7)
LYMPHS ABS: 4.5 10*3/uL — AB (ref 0.7–4.0)
Lymphocytes Relative: 24 %
METAMYELOCYTES PCT: 1 %
MONOS PCT: 4 %
MYELOCYTES: 0 %
Monocytes Absolute: 0.7 10*3/uL (ref 0.1–1.0)
NEUTROS ABS: 13.1 10*3/uL — AB (ref 1.7–7.7)
NEUTROS PCT: 53 %
NRBC: 0 /100{WBCs}
OTHER: 0 %
Promyelocytes Absolute: 0 %

## 2016-08-03 LAB — GLUCOSE, CAPILLARY
Glucose-Capillary: 111 mg/dL — ABNORMAL HIGH (ref 65–99)
Glucose-Capillary: 120 mg/dL — ABNORMAL HIGH (ref 65–99)
Glucose-Capillary: 122 mg/dL — ABNORMAL HIGH (ref 65–99)
Glucose-Capillary: 139 mg/dL — ABNORMAL HIGH (ref 65–99)
Glucose-Capillary: 146 mg/dL — ABNORMAL HIGH (ref 65–99)
Glucose-Capillary: 219 mg/dL — ABNORMAL HIGH (ref 65–99)
Glucose-Capillary: 264 mg/dL — ABNORMAL HIGH (ref 65–99)

## 2016-08-03 LAB — TRIGLYCERIDES: Triglycerides: 179 mg/dL — ABNORMAL HIGH (ref <150)

## 2016-08-03 LAB — MAGNESIUM: MAGNESIUM: 1.8 mg/dL (ref 1.7–2.4)

## 2016-08-03 LAB — CBC
HEMATOCRIT: 27.7 % — AB (ref 36.0–46.0)
HEMOGLOBIN: 8.7 g/dL — AB (ref 12.0–15.0)
MCH: 29.3 pg (ref 26.0–34.0)
MCHC: 31.4 g/dL (ref 30.0–36.0)
MCV: 93.3 fL (ref 78.0–100.0)
Platelets: 208 10*3/uL (ref 150–400)
RBC: 2.97 MIL/uL — ABNORMAL LOW (ref 3.87–5.11)
RDW: 16.1 % — ABNORMAL HIGH (ref 11.5–15.5)
WBC: 18.7 10*3/uL — AB (ref 4.0–10.5)

## 2016-08-03 LAB — PHOSPHORUS: PHOSPHORUS: 4.1 mg/dL (ref 2.5–4.6)

## 2016-08-03 LAB — PREALBUMIN: PREALBUMIN: 10.4 mg/dL — AB (ref 18–38)

## 2016-08-03 MED ORDER — VITAL HIGH PROTEIN PO LIQD
1000.0000 mL | ORAL | Status: DC
  Administered 2016-08-03: 1000 mL

## 2016-08-03 MED ORDER — MAGNESIUM SULFATE 4 GM/100ML IV SOLN
4.0000 g | Freq: Once | INTRAVENOUS | Status: AC
  Administered 2016-08-03: 4 g via INTRAVENOUS
  Filled 2016-08-03: qty 100

## 2016-08-03 MED ORDER — FUROSEMIDE 40 MG PO TABS
40.0000 mg | ORAL_TABLET | Freq: Every day | ORAL | Status: DC
  Administered 2016-08-04 – 2016-08-07 (×4): 40 mg via ORAL
  Filled 2016-08-03 (×4): qty 1

## 2016-08-03 MED ORDER — M.V.I. ADULT IV INJ
INTRAVENOUS | Status: AC
  Administered 2016-08-03: 17:00:00 via INTRAVENOUS
  Filled 2016-08-03: qty 2400

## 2016-08-03 MED ORDER — POTASSIUM CHLORIDE 10 MEQ/50ML IV SOLN
10.0000 meq | INTRAVENOUS | Status: AC
  Filled 2016-08-03 (×4): qty 50

## 2016-08-03 MED ORDER — FAT EMULSION 20 % IV EMUL
240.0000 mL | INTRAVENOUS | Status: AC
  Administered 2016-08-03: 240 mL via INTRAVENOUS
  Filled 2016-08-03: qty 250

## 2016-08-03 MED ORDER — POTASSIUM CHLORIDE 10 MEQ/50ML IV SOLN
10.0000 meq | INTRAVENOUS | Status: AC
  Administered 2016-08-03 (×6): 10 meq via INTRAVENOUS
  Filled 2016-08-03 (×2): qty 50

## 2016-08-03 NOTE — Progress Notes (Signed)
Cortrak Tube Team Note:  Consult received to place a Cortrak feeding tube.   A 10 F Cortrak tube was placed in the right nare and secured with a nasal bridle at 90 cm. Per the Cortrak monitor reading the tube tip is post pyloric.   X-ray is required, abdominal x-ray has been ordered by the Cortrak team. Please confirm tube placement before using the Cortrak tube.   If the tube becomes dislodged please keep the tube and contact the Cortrak team at [email or web address] ([email or web address]) for replacement.  If after hours and replacement cannot be delayed, place a NG tube and confirm placement with an abdominal x-ray.    [place], [place], [place] Pager [phone] After Hours Pager

## 2016-08-03 NOTE — Progress Notes (Signed)
PHARMACY - ADULT TOTAL PARENTERAL NUTRITION CONSULT NOTE   Pharmacy Consult:  TPN Indication:  Prolonged NPO status/high NG output   Patient Measurements: Height: 5\' 3"  (160 cm) Weight: 179 lb 3.7 oz (81.3 kg) IBW/kg (Calculated) : 52.4 TPN AdjBW (KG): 61.7 Body mass index is 31.75 kg/m. 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 Patient had a significant history of N/V for 2-4 weeks PTA but weight appears to have been maintained. Patient has had several NG tube replacement due to nausea and vomiting.  Pharmacy consulted to manage TPN since 06/23/16.    GI: On TPN for persistent ileus and mesenteric ischemia, LGIB. Bowels now moving, had 2 BMs yesterday. CDiff negative on 6/29. On clear liquids. Appetite is charted as fair but poor intake. Albumin low at 1.4, prealbumin low at 10.4 but up from 7.4. PPI PO Endo: DM on glipizide/metformin PTA. CBGs mostly controlled now (100-140s yesterday) Did not have insulin in TPN on 6/30. Insulin requirements in the past 24 hours: 6 units SSI + 100 units in TPN Lytes: low Na/CL but stable. K has continued to be low but up to 3.5 after 161mEq of replacement yesterday (goal >/= 4 with ileus) RN ordered 3 runs this am, Mag 1.8 s/p 4g on 7/1 (goal >/= 2 for ileus). CoCa 9.7, Phos wnl. On Lasix daily Renal: SCr stable, CrCl ~15ml/min. UOP remains good. BUN down to 32. NS at 22ml/hr Pulm: pleural effusion, 4L [place]. Brovana, Pulmicort, PRN albuterol Cards: aortic dissection stable, unsuccessful TEVAR - BP controlled, tachy. Lopressor, IV Lasix, PRN labetolol Hepatobil: LFTs normalized, tbili wnl, TG down to 179. Neuro: Celexa, Klonopin, PRN Fentanyl/Dialudid/Xanax/APAP - pain score 0-5 ID: S/p 2 courses of Merrem for PNA, s/p 3d Cipro >> s/p 7d vanc for E.faecalis UTI. Afebrile, WBC back up to 18.7.  Best Practices: SCDs, CHG, MC TPN Access: PICC placed 06/21/16  TPN start date:  06/23/16  Nutritional Goals (per RD recommendation on 6/25): 2000-2200 kCal  110-120 gm protein  Current Nutrition:   TPN Clear liquid diet (tolerating but little intake)  Plan:  Continue Clinimix E 5/15 at 100 ml/hr Continue 20% lipid emulsion at 20 ml/hr over 12 hrs Continue NS at 10 ml/hr TPN and IV lipid emulsion provides 120 g of protein and 2184 kCals per day meeting 100% of protein and kCal needs Continue MVI in TPN Continue TE in TPN every other day, next 7/3 Continue 100 units of regular insulin in TPN Continue TCTS SSI Q6h and adjust as needed Monitor TPN labs, Bmet and Mg tomorrow F/U transfer to tertiary center or [facility] additional 6 runs of KCl 66mEq to give 9 runs total (RN already ordered 3 runs this am) Give another Mg 4g IV x 1 today   Haley Graham, PharmD, BCPS Clinical Pharmacist Pager [phone] 08/03/2016 7:10 AM

## 2016-08-03 NOTE — Progress Notes (Signed)
      [address]       [place] [address]             [phone]       Stable day  No acute changes  [name]. [name], MD [facility] [phone]

## 2016-08-03 NOTE — Progress Notes (Signed)
Physical Therapy Treatment Patient Details Name: ANALIS DISTLER MRN: [id number] DOB: Sep 22, 1955 Today's Date: 08/03/2016    History of Present Illness Pt adm with type B aortic dissection. Treated non surgically with BP control and pain control. Pt developed pancreatitis and ileus with NG tube placed. PMH - anxiety, arthritis    PT Comments    Pt admitted with above diagnosis. Pt currently with functional limitations due to the deficits listed below (see PT Problem List). Pt incr distance today.  Seemed to be in better spirits and walked whole unit.  Still 4LO2 to keep sats >90%.  Progressing.   Pt will benefit from skilled PT to increase their independence and safety with mobility to allow discharge to the venue listed below.     Follow Up Recommendations  Home health PT;Supervision/Assistance - 24 hour     Equipment Recommendations  Rolling walker with 5" wheels    Recommendations for Other Services       Precautions / Restrictions Precautions Precautions: Fall Restrictions Weight Bearing Restrictions: No    Mobility  Bed Mobility Overal bed mobility: Needs Assistance Bed Mobility: Rolling;Sidelying to Sit Rolling: Supervision Sidelying to sit: Min guard;HOB elevated Supine to sit: HOB elevated;Min guard     General bed mobility comments: min guard for safety; use of bed rail; cues for sequencing and technique  Transfers Overall transfer level: Needs assistance Equipment used: Rolling walker (2 wheeled) Transfers: Sit to/from Stand Sit to Stand: Min guard         General transfer comment: cues for safe hand placement  Ambulation/Gait Ambulation/Gait assistance: Min guard Ambulation Distance (Feet): 370 Feet Assistive device: Rolling walker (2 wheeled) Gait Pattern/deviations: Step-through pattern;Decreased stride length;Drifts right/left Gait velocity: decr Gait velocity interpretation: Below normal speed for age/gender General Gait Details: RR up to 50 at  end of walk; other vitals WNL; pt on 4L; cues for breathing and cadence; pt increased cadence this session; encouragement to continue; one brief standing rest break for DOE 3/4.    Stairs            Wheelchair Mobility    Modified Rankin (Stroke Patients Only)       Balance Overall balance assessment: Needs assistance Sitting-balance support: No upper extremity supported;Feet supported Sitting balance-Leahy Scale: Good     Standing balance support: During functional activity;No upper extremity supported Standing balance-Leahy Scale: Fair Standing balance comment: able to static stand without UE support                            Cognition Arousal/Alertness: Awake/alert Behavior During Therapy: Anxious;Flat affect Overall Cognitive Status: Impaired/Different from baseline Area of Impairment: Following commands;Attention;Problem solving;Awareness                   Current Attention Level: Sustained   Following Commands: Follows multi-step commands consistently;Follows multi-step commands with increased time Safety/Judgement: Decreased awareness of safety;Decreased awareness of deficits Awareness: Intellectual Problem Solving: Decreased initiation;Requires verbal cues;Slow processing        Exercises Total Joint Exercises Long Arc Quad: AROM;Both;Seated;10 reps General Exercises - Lower Extremity Ankle Circles/Pumps: AROM;10 reps;Both;Supine    General Comments        Pertinent Vitals/Pain Pain Assessment: Faces Faces Pain Scale: Hurts little more Pain Location: all over Pain Descriptors / Indicators: Aching;Grimacing Pain Intervention(s): Limited activity within patient's tolerance;Monitored during session;Repositioned    Home Living  Prior Function            PT Goals (current goals can now be found in the care plan section) Progress towards PT goals: Progressing toward goals    Frequency    Min  3X/week      PT Plan Current plan remains appropriate    Co-evaluation              AM-PAC PT "6 Clicks" Daily Activity  Outcome Measure  Difficulty turning over in bed (including adjusting bedclothes, sheets and blankets)?: A Lot Difficulty moving from lying on back to sitting on the side of the bed? : A Little Difficulty sitting down on and standing up from a chair with arms (e.g., wheelchair, bedside commode, etc,.)?: A Little Help needed moving to and from a bed to chair (including a wheelchair)?: A Little Help needed walking in hospital room?: A Little Help needed climbing 3-5 steps with a railing? : A Little 6 Click Score: 17    End of Session Equipment Utilized During Treatment: Gait belt;Oxygen Activity Tolerance: Patient tolerated treatment well Patient left: with call bell/phone within reach;in chair Nurse Communication: Mobility status PT Visit Diagnosis: Muscle weakness (generalized) (M62.81);Other abnormalities of gait and mobility (R26.89)     Time: 1962-2297 PT Time Calculation (min) (ACUTE ONLY): 23 min  Charges:  $Gait Training: 8-22 mins $Therapeutic Exercise: 8-22 mins                    G Codes:       Noemi Bellissimo,PT Acute Rehabilitation [phone]   [name] 08/03/2016, 3:33 PM

## 2016-08-03 NOTE — Progress Notes (Signed)
10 Days Post-Op Procedure(s) (LRB): ARCH AND ABDOMINAL AORTA  ANGIOGRAM (N/A) Subjective: Not taking po's- will place feeding tube for trickle feeds WBC up to 18k w/o fever- blood, urine cultures sent Cont with watery diarrhea TEVAR to cover tear origin in prox descending aorta by VVS later this week  Objective: Vital signs in last 24 hours: Temp:  [97.6 F (36.4 C)-98.7 F (37.1 C)] 98.7 F (37.1 C) (07/02 1531) Pulse Rate:  [87-398] 87 (07/02 1500) Cardiac Rhythm: Normal sinus rhythm;Sinus tachycardia (07/02 0756) Resp:  [18-35] 23 (07/02 1500) BP: (106-129)/(51-63) 106/56 (07/02 1500) SpO2:  [92 %-100 %] 96 % (07/02 1500)  Hemodynamic parameters for last 24 hours:  nsr  Intake/Output from previous day: 07/01 0701 - 07/02 0700 In: 3410 [I.V.:2860; IV Piggyback:550] Out: 2000 [Urine:2000] Intake/Output this shift: Total I/O In: 1370 [P.O.:90; I.V.:880; IV Piggyback:400] Out: 2753 [Urine:2751; Stool:2]       Exam    General- alert and comfortable   Lungs- clear without rales, wheezes   Cor- regular rate and rhythm, no murmur , gallop   Abdomen- soft, non-tender   Extremities - warm, non-tender, minimal edema   Neuro- oriented, appropriate, no focal weakness   Lab Results:  Recent Labs  08/01/16 0532 08/03/16 0447  WBC 15.6* 18.7*  HGB 8.9* 8.7*  HCT 27.6* 27.7*  PLT 203 208   BMET:  Recent Labs  08/02/16 2137 08/03/16 0447  NA 129* 129*  K 3.2* 3.5  CL 86* 89*  CO2 34* 35*  GLUCOSE 122* 110*  BUN 32* 32*  CREATININE 1.01* 0.99  CALCIUM 7.7* 7.6*    PT/INR: No results for input(s): LABPROT, INR in the last 72 hours. ABG    Component Value Date/Time   PHART 7.430 06/28/2016 1615   HCO3 20.7 06/28/2016 1615   TCO2 22 06/28/2016 1615   ACIDBASEDEF 3.0 (H) 06/28/2016 1615   O2SAT 93.0 06/28/2016 1615   CBG (last 3)   Recent Labs  08/02/16 2326 08/03/16 0736 08/03/16 1217  GLUCAP 149* 120* 146*    Assessment/Plan: S/P Procedure(s)  (LRB): ARCH AND ABDOMINAL AORTA  ANGIOGRAM (N/A) Follow up cultures and repeat CBC in am Trickle TF Cont full TNA  LOS: 44 days    Haley Graham 08/03/2016

## 2016-08-03 NOTE — Progress Notes (Addendum)
Nutrition Follow-up  DOCUMENTATION CODES:   Obesity unspecified  INTERVENTION:  Cortrak tube placed, tip at Ligament of [place]  Vital High Protein @ 10 ml/hr  TPN per Pharmacy  Offer Boost Breeze   NUTRITION DIAGNOSIS:   Inadequate oral intake related to altered GI function as evidenced by NPO status. Ongoing.   GOAL:   Patient will meet greater than or equal to 90% of their needs Met.   MONITOR:   TF tolerance, Diet advancement, I & O's  ASSESSMENT:   Pt with acute aortic dissection originating at the proximal descending thoracic aorta. Pt admitted for pain control, blood pressure control, bed rest and IV hydration.   Spoke with RN and MD about poor oral nutrition. Per RN pt has only taken 30 ml of fluid today. She has been reluctant to do much recently. MD ok to place Cortrak tube and start trickle feedings.   Medications reviewed and include: magic mouthwash, Boost Breeze, lasix Labs reviewed: Na 129 (L), Prealbumin 10.4 (L) 7/2: Cortrak tube placed, tip at LOT Weight stable on TPN  Clinimix E 5/15 at 100 ml/hr 20% lipid emulsion at 20 ml/hr over 12 hrs TPN and IV lipid emulsion provides 120 g of protein and 2184 kCals per day meeting 100% of protein and kCal needs MVI daily, TE every other day  Diet Order:  Diet clear liquid Room service appropriate? Yes; Fluid consistency: Thin TPN (CLINIMIX-E) Adult TPN (CLINIMIX-E) Adult  Skin:   (MASD perimeum)  Last BM:  7/2, type 7/medium  Height:   Ht Readings from Last 1 Encounters:  07/25/16 '5\' 3"'  (1.6 m)    Weight:   Wt Readings from Last 1 Encounters:  08/02/16 179 lb 3.7 oz (81.3 kg)    Ideal Body Weight:  52.2 kg  BMI:  Body mass index is 31.75 kg/m.  Estimated Nutritional Needs:   Kcal:  2000-2200  Protein:  110-120 gm  Fluid:  per MD  EDUCATION NEEDS:   No education needs identified at this time  [place], [place], [place] Pager [phone] After Hours Pager

## 2016-08-03 NOTE — Progress Notes (Signed)
I have not spoken to the family or patient, but will tentatively place her back on the OR schedule for Thursday.  I will see her tomorrow.  Haley Graham

## 2016-08-04 DIAGNOSIS — I7102 Dissection of abdominal aorta: Secondary | ICD-10-CM

## 2016-08-04 LAB — BASIC METABOLIC PANEL
Anion gap: 7 (ref 5–15)
BUN: 31 mg/dL — ABNORMAL HIGH (ref 6–20)
CO2: 33 mmol/L — ABNORMAL HIGH (ref 22–32)
Calcium: 7.9 mg/dL — ABNORMAL LOW (ref 8.9–10.3)
Chloride: 88 mmol/L — ABNORMAL LOW (ref 101–111)
Creatinine, Ser: 0.98 mg/dL (ref 0.44–1.00)
GFR calc Af Amer: >60 mL/min (ref >60)
GFR calc non Af Amer: >60 mL/min (ref >60)
Glucose, Bld: 125 mg/dL — ABNORMAL HIGH (ref 65–99)
Potassium: 3.5 mmol/L (ref 3.5–5.1)
Sodium: 128 mmol/L — ABNORMAL LOW (ref 135–145)

## 2016-08-04 LAB — URINE CULTURE: Special Requests: NORMAL

## 2016-08-04 LAB — GLUCOSE, CAPILLARY
Glucose-Capillary: 137 mg/dL — ABNORMAL HIGH (ref 65–99)
Glucose-Capillary: 134 mg/dL — ABNORMAL HIGH (ref 65–99)
Glucose-Capillary: 179 mg/dL — ABNORMAL HIGH (ref 65–99)
Glucose-Capillary: 151 mg/dL — ABNORMAL HIGH (ref 65–99)
Glucose-Capillary: 136 mg/dL — ABNORMAL HIGH (ref 65–99)
Glucose-Capillary: 178 mg/dL — ABNORMAL HIGH (ref 65–99)

## 2016-08-04 LAB — CBC
HCT: 29 % — ABNORMAL LOW (ref 36.0–46.0)
Hemoglobin: 9 g/dL — ABNORMAL LOW (ref 12.0–15.0)
MCH: 29.3 pg (ref 26.0–34.0)
MCHC: 31 g/dL (ref 30.0–36.0)
MCV: 94.5 fL (ref 78.0–100.0)
Platelets: 228 10*3/uL (ref 150–400)
RBC: 3.07 MIL/uL — ABNORMAL LOW (ref 3.87–5.11)
RDW: 16.3 % — ABNORMAL HIGH (ref 11.5–15.5)
WBC: 22.4 10*3/uL — ABNORMAL HIGH (ref 4.0–10.5)

## 2016-08-04 LAB — MAGNESIUM: Magnesium: 1.8 mg/dL (ref 1.7–2.4)

## 2016-08-04 MED ORDER — VITAL 1.5 CAL PO LIQD
1000.0000 mL | ORAL | Status: DC
  Administered 2016-08-04: 1000 mL
  Filled 2016-08-04 (×2): qty 1000

## 2016-08-04 MED ORDER — MAGNESIUM SULFATE 4 GM/100ML IV SOLN
4.0000 g | Freq: Once | INTRAVENOUS | Status: AC
  Administered 2016-08-04: 4 g via INTRAVENOUS
  Filled 2016-08-04: qty 100

## 2016-08-04 MED ORDER — TRACE MINERALS CR-CU-MN-SE-ZN 10-1000-500-60 MCG/ML IV SOLN
INTRAVENOUS | Status: AC
  Administered 2016-08-04: 19:00:00 via INTRAVENOUS
  Filled 2016-08-04: qty 1992

## 2016-08-04 MED ORDER — FAT EMULSION 20 % IV EMUL
240.0000 mL | INTRAVENOUS | Status: AC
  Administered 2016-08-04: 240 mL via INTRAVENOUS
  Filled 2016-08-04: qty 250

## 2016-08-04 MED ORDER — POTASSIUM CHLORIDE 10 MEQ/50ML IV SOLN
10.0000 meq | INTRAVENOUS | Status: DC
  Administered 2016-08-04: 10 meq via INTRAVENOUS
  Filled 2016-08-04 (×2): qty 50

## 2016-08-04 MED ORDER — POTASSIUM CHLORIDE 20 MEQ/15ML (10%) PO SOLN
40.0000 meq | Freq: Once | ORAL | Status: AC
  Administered 2016-08-04: 40 meq
  Filled 2016-08-04: qty 30

## 2016-08-04 MED ORDER — DEXTROSE 5 % IV SOLN
1.0000 g | Freq: Two times a day (BID) | INTRAVENOUS | Status: AC
  Administered 2016-08-04 – 2016-08-11 (×15): 1 g via INTRAVENOUS
  Filled 2016-08-04 (×16): qty 1

## 2016-08-04 MED ORDER — VITAL HIGH PROTEIN PO LIQD: 1000.0000 mL | ORAL | Status: DC

## 2016-08-04 NOTE — Progress Notes (Signed)
PHARMACY - ADULT TOTAL PARENTERAL NUTRITION CONSULT NOTE   Pharmacy Consult:  TPN Indication:  Prolonged NPO status/high NG output   Patient Measurements: Height: 5\' 3"  (160 cm) Weight: 179 lb 3.7 oz (81.3 kg) IBW/kg (Calculated) : 52.4 TPN AdjBW (KG): 61.7 Body mass index is 31.75 kg/m. 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 Patient had a significant history of N/V for 2-4 weeks PTA but weight appears to have been maintained. Patient has had several NG tube replacement due to nausea and vomiting.  Pharmacy consulted to manage TPN since 06/23/16.    GI: On TPN for persistent ileus and mesenteric ischemia, LGIB. Bowels now moving, had 2 BMs yesterday. CDiff negative on 6/29. On clear liquids. Appetite is charted as fair but poor intake. Placed Cortrak tube and started on TFs 7/2. Albumin low at 1.4, prealbumin low at 10.4 but up from 7.4. PPI PO Endo: DM on glipizide/metformin PTA. CBGs mostly controlled now (110-140s yesterday) Did not have insulin in TPN on 6/30. Insulin requirements in the past 24 hours: 8 units SSI + 100 units in TPN Lytes: low Na/CL but stable. K has continued to be low but up to 3.5 after 23mEq of replacement yesterday (goal >/= 4 with ileus) Mag 1.8 s/p 4g on 7/2 (goal >/= 2 for ileus). CoCa 9.7, Phos wnl. Has required large amounts of K and Mg replacement each day. On Lasix daily Renal: SCr stable, CrCl ~59ml/min. UOP remains good. BUN down to 31. NS at 35ml/hr Pulm: pleural effusion, 5L [place]. Brovana, Pulmicort, PRN albuterol Cards: aortic dissection stable, unsuccessful TEVAR - BP controlled, tachy. Lopressor, IV Lasix, PRN labetolol Hepatobil: LFTs normalized, tbili wnl, TG down to 179. Neuro: Celexa, Klonopin, PRN Fentanyl/Dialudid/Xanax/APAP - pain score 0-5 ID: S/p 2 courses of Merrem for PNA, s/p 3d Cipro >> s/p 7d vanc for E.faecalis UTI. Afebrile, WBC back up to 22.7.  Best  Practices: SCDs, CHG, MC TPN Access: PICC placed 06/21/16  TPN start date: 06/23/16  Nutritional Goals (per RD recommendation on 7/2): 2000-2200 kCal  110-120 gm protein  Current Nutrition:   TPN Clear liquid diet (tolerating but little intake) Vital HP at 47ml/hr (21g of protein and 240 kcal) Boost Breeze BID  Plan:  Decrease Clinimix E 5/15 to 98ml/hr Continue 20% lipid emulsion at 20 ml/hr over 12 hrs Continue Vital HP at 37ml/hr per MD Continue NS at 10 ml/hr TPN, IV lipid emulsion, and TF provides 121 g of protein and 2134 kCals per day meeting 100% of protein and kCal needs Continue MVI in TPN Continue TE in TPN every other day, next 7/3 Decrease to 80 units of regular insulin in TPN Continue TCTS SSI Q6h and adjust as needed Monitor TPN labs, Bmet F/U advancement of TFs, transfer to tertiary center or [facility]   Give Mg 4g IV x 1 Give KCl 66mEq IV x 6 runs    Elenor Quinones, PharmD, [facility] Clinical Pharmacist Pager [phone] 08/04/2016 7:16 AM

## 2016-08-04 NOTE — Progress Notes (Signed)
11 Days Post-Op Procedure(s) (LRB): ARCH AND ABDOMINAL AORTA  ANGIOGRAM (N/A) Subjective: Abdominal status stable c/w chronic mesenteric ischemia- no appetite, intermittent diarrhea[ c diff neg] TF passed to distal antrum and trickle feeding started  Rising WBC 18>>22k, no fever Blood cultures pending, urine culture contaminated- will repeat with in/out cath. Has had UTI in past PICC line at risk for infection with longterm TPN Start cefepime to cover while waiting for cultures- prosthetic graft planned for thurs Objective: Vital signs in last 24 hours: Temp:  [97.8 F (36.6 C)-98.7 F (37.1 C)] 98.2 F (36.8 C) (07/03 0807) Pulse Rate:  [87-105] 95 (07/03 1000) Cardiac Rhythm: Normal sinus rhythm (07/03 0800) Resp:  [19-33] 23 (07/03 1000) BP: (92-127)/(46-60) 92/49 (07/03 1000) SpO2:  [93 %-98 %] 93 % (07/03 1000) Weight:  [181 lb (82.1 kg)] 181 lb (82.1 kg) (07/03 0906)  Hemodynamic parameters for last 24 hours:  nsr  Intake/Output from previous day: 07/02 0701 - 07/03 0700 In: 2388 [P.O.:90; I.V.:1868; NG/GT:30; IV Piggyback:400] Out: 3664 [Urine:3651; Stool:2] Intake/Output this shift: Total I/O In: 360 [I.V.:330; NG/GT:30] Out: -   abd distended but nontender Legs warm with pedalulses Lab Results:  Recent Labs  08/03/16 0447 08/04/16 0430  WBC 18.7* 22.4*  HGB 8.7* 9.0*  HCT 27.7* 29.0*  PLT 208 228   BMET:  Recent Labs  08/03/16 0447 08/04/16 0430  NA 129* 128*  K 3.5 3.5  CL 89* 88*  CO2 35* 33*  GLUCOSE 110* 125*  BUN 32* 31*  CREATININE 0.99 0.98  CALCIUM 7.6* 7.9*    PT/INR: No results for input(s): LABPROT, INR in the last 72 hours. ABG    Component Value Date/Time   PHART 7.430 06/28/2016 1615   HCO3 20.7 06/28/2016 1615   TCO2 22 06/28/2016 1615   ACIDBASEDEF 3.0 (H) 06/28/2016 1615   O2SAT 93.0 06/28/2016 1615   CBG (last 3)   Recent Labs  08/03/16 2326 08/04/16 0511 08/04/16 0809  GLUCAP 139* 137* 134*     Assessment/Plan: S/P Procedure(s) (LRB): ARCH AND ABDOMINAL AORTA  ANGIOGRAM (N/A) Follow cultures Change PICC Maxepime empiric coverage   LOS: 45 days    [name] 08/04/2016

## 2016-08-04 NOTE — Progress Notes (Signed)
Nutrition Follow-up  DOCUMENTATION CODES:   Obesity unspecified  INTERVENTION:   Adjust TF to Vital 1.5 formula @ 20 ml/hr Provides: 720 kcal, 32 grams protein, and 366 ml free water RECOMMEND goal of 55 ml/hr with 30 ml Prostat BID (provides: 2180 kcal, 119 grams protein, and 1008 ml free water)  TPN adjustments as TF advanced per Pharmacy  Clear Liquid diet as tolerated with Boost Breeze TID (pt taking very little despite encouragement)    NUTRITION DIAGNOSIS:   Inadequate oral intake related to altered GI function as evidenced by NPO status. Ongoing.   GOAL:   Patient will meet greater than or equal to 90% of their needs Met.   MONITOR:   TF tolerance, Diet advancement, I & O's  REASON FOR ASSESSMENT:   Consult Enteral/tube feeding initiation and management  ASSESSMENT:   Pt with acute aortic dissection originating at the proximal descending thoracic aorta. Pt admitted for pain control, blood pressure control, bed rest and IV hydration.   TPN started 5/22 due to persistent ileus and nonacute mesenteric ischemia. Pt having liquid bowel movements (cdiff negative).  Pt on clear liquids but PO intake very poor. Per RN 7/2 only had 30 ml of fluid intake  7/2 Cortrak placed, tip at LOT and trickle feedings started (Vital High Protein @ 10 ml/hr) 7/3 Vital High Protein increased to 20 ml/hr and TPN decreased to 83 ml/hr of Clinimix E 5/15, 20% ILE @ 20 ml/hr x 12 hours  TPN provides: 1893 kcal and 100 grams protein Vital 1.5 @ 20 ml/hr provides: 720 kcal and 32 grams protein Total: 2613 kcal and 132 grams protein -  Meeting > 100% of needs  Medications reviewed and include: lasix, novolog, magic mouthwash Labs reviewed: Na 128 (L) CBG's: [phone] Weight stable on nutrition support   Diet Order:  Diet clear liquid Room service appropriate? Yes; Fluid consistency: Thin TPN (CLINIMIX-E) Adult TPN (CLINIMIX-E) Adult  Skin:   (MASD perimeum)  Last BM:  7/2,  type 7/medium  Height:   Ht Readings from Last 1 Encounters:  07/25/16 '5\' 3"'  (1.6 m)    Weight:   Wt Readings from Last 1 Encounters:  08/04/16 181 lb (82.1 kg)    Ideal Body Weight:  52.2 kg  BMI:  Body mass index is 32.06 kg/m.  Estimated Nutritional Needs:   Kcal:  2000-2200  Protein:  110-120 gm  Fluid:  per MD  EDUCATION NEEDS:   No education needs identified at this time  [place], [place], [place] Pager [phone] After Hours Pager

## 2016-08-04 NOTE — Progress Notes (Signed)
Patient ID: Haley Graham, female   DOB: [birth year], 61 y.o.   MRN: [id number] SICU Evening Rounds   Hemodynamically stable, afebrile  Urine output good  Ambulated this evening

## 2016-08-04 NOTE — Progress Notes (Signed)
Spoke to nurse about the order to take out a suspected PICC infection and place a new PICC line. Research suggest that best practice is to treat the suspected CLASBI vs. removing a PICC, culturing the tip and placing a new PICC. He will follow up with the MD and update the order. [employer]

## 2016-08-04 NOTE — Progress Notes (Deleted)
Assumed pt care with sheath still in place from recent procedure. Charge nurse notified for the need for removal. Awaiting "checked off" RN capable of pulling out sheath for removal. No s/s of distress at this time. Sheath site with no bleeding, swelling or hematoma.

## 2016-08-04 NOTE — Progress Notes (Signed)
I spoke with the husband and daughter today via telephone.  I discussed a second attempt at stent graft repair of her aortic dissection.  I discussed possible open femoral artery access and left brachial access.  We will try to snare a wire from the arm in order to create a rail to get the stent into position.  We again discussed all of the risks of the procedure, and they understand.  I am going to repeat her CT angio tonight.  I will monitor her WBC, as I would not recommend TEVAR in the setting of an active infection  Wells Brabham

## 2016-08-05 ENCOUNTER — Inpatient Hospital Stay (HOSPITAL_COMMUNITY): Payer: BLUE CROSS/BLUE SHIELD

## 2016-08-05 LAB — URINE CULTURE: Culture: NO GROWTH

## 2016-08-05 LAB — GLUCOSE, CAPILLARY
Glucose-Capillary: 115 mg/dL — ABNORMAL HIGH (ref 65–99)
Glucose-Capillary: 139 mg/dL — ABNORMAL HIGH (ref 65–99)
Glucose-Capillary: 146 mg/dL — ABNORMAL HIGH (ref 65–99)
Glucose-Capillary: 150 mg/dL — ABNORMAL HIGH (ref 65–99)
Glucose-Capillary: 161 mg/dL — ABNORMAL HIGH (ref 65–99)
Glucose-Capillary: 200 mg/dL — ABNORMAL HIGH (ref 65–99)

## 2016-08-05 LAB — BASIC METABOLIC PANEL
Anion gap: 7 (ref 5–15)
BUN: 28 mg/dL — ABNORMAL HIGH (ref 6–20)
CO2: 31 mmol/L (ref 22–32)
Calcium: 7.6 mg/dL — ABNORMAL LOW (ref 8.9–10.3)
Chloride: 92 mmol/L — ABNORMAL LOW (ref 101–111)
Creatinine, Ser: 0.97 mg/dL (ref 0.44–1.00)
GFR calc Af Amer: >60 mL/min (ref >60)
GFR calc non Af Amer: >60 mL/min (ref >60)
Glucose, Bld: 126 mg/dL — ABNORMAL HIGH (ref 65–99)
Potassium: 3.3 mmol/L — ABNORMAL LOW (ref 3.5–5.1)
Sodium: 130 mmol/L — ABNORMAL LOW (ref 135–145)

## 2016-08-05 LAB — CBC
HCT: 28.7 % — ABNORMAL LOW (ref 36.0–46.0)
Hemoglobin: 8.8 g/dL — ABNORMAL LOW (ref 12.0–15.0)
MCH: 29.3 pg (ref 26.0–34.0)
MCHC: 30.7 g/dL (ref 30.0–36.0)
MCV: 95.7 fL (ref 78.0–100.0)
Platelets: 219 10*3/uL (ref 150–400)
RBC: 3 MIL/uL — ABNORMAL LOW (ref 3.87–5.11)
RDW: 16.4 % — ABNORMAL HIGH (ref 11.5–15.5)
WBC: 17.9 10*3/uL — ABNORMAL HIGH (ref 4.0–10.5)

## 2016-08-05 MED ORDER — IOPAMIDOL (ISOVUE-370) INJECTION 76%
100.0000 mL | Freq: Once | INTRAVENOUS | Status: AC | PRN
  Administered 2016-08-05: 100 mL via INTRAVENOUS

## 2016-08-05 MED ORDER — MAGNESIUM SULFATE 2 GM/50ML IV SOLN
2.0000 g | Freq: Once | INTRAVENOUS | Status: AC
  Administered 2016-08-05: 2 g via INTRAVENOUS
  Filled 2016-08-05: qty 50

## 2016-08-05 MED ORDER — PANTOPRAZOLE SODIUM 40 MG PO PACK
40.0000 mg | PACK | Freq: Every day | ORAL | Status: DC
  Administered 2016-08-05 – 2016-08-07 (×3): 40 mg
  Filled 2016-08-05 (×3): qty 20

## 2016-08-05 MED ORDER — POTASSIUM CHLORIDE 20 MEQ/15ML (10%) PO SOLN
40.0000 meq | Freq: Three times a day (TID) | ORAL | Status: AC
  Administered 2016-08-05 (×3): 40 meq
  Filled 2016-08-05 (×3): qty 30

## 2016-08-05 MED ORDER — POTASSIUM CHLORIDE 20 MEQ/15ML (10%) PO SOLN
ORAL | Status: AC
  Filled 2016-08-05: qty 30

## 2016-08-05 MED ORDER — M.V.I. ADULT IV INJ
INTRAVENOUS | Status: DC
  Administered 2016-08-05: 21:00:00 via INTRAVENOUS
  Filled 2016-08-05: qty 1680

## 2016-08-05 NOTE — Progress Notes (Signed)
PHARMACY - ADULT TOTAL PARENTERAL NUTRITION CONSULT NOTE   Pharmacy Consult:  TPN Indication:  Prolonged NPO status/high NG output   Patient Measurements: Height: 5\' 3"  (160 cm) Weight: 180 lb 5.4 oz (81.8 kg) IBW/kg (Calculated) : 52.4 TPN AdjBW (KG): 61.7 Body mass index is 31.95 kg/m. 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 Patient had a significant history of N/V for 2-4 weeks PTA but weight appears to have been maintained. Patient has had several NG tube replacement due to nausea and vomiting.  Pharmacy consulted to manage TPN since 06/23/16.    GI: On TPN for persistent ileus and mesenteric ischemia, LGIB. Bowels now moving, had 2 BMs yesterday. CDiff negative on 6/29. On clear liquids. Appetite is charted as fair but poor intake. Placed Cortrak tube and started on TFs 7/2. Patient does report some nausea this am. Vascular plans to take to OR tomorrow for thoracic aortic endovascular stent but may not with possible active infection. Albumin low at 1.4, prealbumin low at 10.4 but up from 7.4. PPI PO Endo: DM on glipizide/metformin PTA. CBGs mostly controlled now (130-170s yesterday) Did not have insulin in TPN on 6/30. Insulin requirements in the past 24 hours: 12 units SSI + 80 units in TPN Lytes: low Na/CL but stable. K has continued to be low and down a little to 3.3, only received 20mEq yesterday but more was ordered (goal >/= 4 with ileus) Mag 1.8 s/p 4g on 7/2 (goal >/= 2 for ileus). CoCa 9.4, Phos wnl. Has required large amounts of K and Mg replacement each day. On Lasix daily Renal: SCr stable, CrCl ~61ml/min. UOP remains good. BUN down to 28. NS at 37ml/hr Pulm: pleural effusion, 5L [place]. Brovana, Pulmicort, PRN albuterol Cards: aortic dissection stable, unsuccessful TEVAR - BP controlled, tachy. Lopressor, IV Lasix, PRN labetolol Hepatobil: LFTs normalized, tbili wnl, TG down to 179. Neuro:  Celexa, Klonopin, PRN Fentanyl/Dialudid/Xanax/APAP - pain score 0-5 ID: S/p 2 courses of Merrem for PNA, s/p 3d Cipro >> s/p 7d vanc for E.faecalis UTI. Afebrile, WBC back up to 22.7.  Best Practices: SCDs, CHG, MC TPN Access: PICC placed 06/21/16  TPN start date: 06/23/16  Nutritional Goals (per RD recommendation on 7/3): 2000-2200 kCal  110-120 gm protein  Current Nutrition:   TPN Clear liquid diet (tolerating but little intake) Vital 1.5 at 51ml/hr (32g of protein and 740 kcal) Boost Breeze BID  Plan:  Decrease Clinimix further to E 5/15 at 70 ml/hr Stop IV lipid emulsions Continue Vital 1.5 at 44ml/hr per MD. Advance to goal of 105ml/hr as tolerated per MD Continue NS at 10 ml/hr TPN, IV lipid emulsion, and TF provides 116 g of protein and 1913 kCals per day meeting 100% of protein and 90% kCal needs Continue MVI in TPN Continue TE in TPN every other day, next 7/5 Decrease to 70 units of regular insulin in TPN Continue TCTS SSI Q6h and adjust as needed Monitor TPN labs F/U advancement of TFs, transfer to tertiary center or [place] rec   Give Mg 2g IV x 1 Give KCl 72mEq per tube x 3 doses today   Elenor Quinones, PharmD, [facility] Clinical Pharmacist Pager [phone] 08/05/2016 7:17 AM

## 2016-08-05 NOTE — Progress Notes (Addendum)
Physical Therapy Treatment Patient Details Name: Haley Graham MRN: [id number] DOB: [birth year] Today's Date: 08/05/2016    History of Present Illness Pt adm with type B aortic dissection. Treated non surgically with BP control and pain control. Pt developed pancreatitis and ileus with NG tube placed. PMH - anxiety, arthritis    PT Comments    Pt admitted with above diagnosis. Pt currently with functional limitations due to balance and endurance deficits. Pt continues to progress and is much less anxious with ambulation.  Walked entire unit with no anxiety and one standing rest break.  Pt did not meet goals set originally due to slow progress and anxiety. Revised goals.  Will continue PT.  Pt will benefit from skilled PT to increase their independence and safety with mobility to allow discharge to the venue listed below.     Follow Up Recommendations  Home health PT;Supervision/Assistance - 24 hour     Equipment Recommendations  Rolling walker with 5" wheels    Recommendations for Other Services       Precautions / Restrictions Precautions Precautions: Fall Restrictions Weight Bearing Restrictions: No    Mobility  Bed Mobility               General bed mobility comments: In chair on arrival.  Just took sponge bath and nurse washed pt hair.   Transfers Overall transfer level: Needs assistance Equipment used: Rolling walker (2 wheeled) Transfers: Sit to/from Stand Sit to Stand: Min guard         General transfer comment: cues for safe hand placement  Ambulation/Gait Ambulation/Gait assistance: Min guard Ambulation Distance (Feet): 370 Feet Assistive device: Rolling walker (2 wheeled) Gait Pattern/deviations: Step-through pattern;Decreased stride length;Drifts right/left Gait velocity: decr Gait velocity interpretation: Below normal speed for age/gender General Gait Details: Vitals WNL with  pt on 4L with sats >90%; cues for breathing and cadence; pt increased  cadence this session; one brief standing rest break for DOE 3/4.    Stairs            Wheelchair Mobility    Modified Rankin (Stroke Patients Only)       Balance Overall balance assessment: Needs assistance Sitting-balance support: No upper extremity supported;Feet supported Sitting balance-Leahy Scale: Good     Standing balance support: During functional activity;No upper extremity supported Standing balance-Leahy Scale: Fair Standing balance comment: able to static stand without UE support                            Cognition Arousal/Alertness: Awake/alert Behavior During Therapy: Anxious;Flat affect Overall Cognitive Status: Impaired/Different from baseline Area of Impairment: Following commands;Attention;Problem solving;Awareness                   Current Attention Level: Sustained   Following Commands: Follows multi-step commands consistently;Follows multi-step commands with increased time Safety/Judgement: Decreased awareness of safety;Decreased awareness of deficits Awareness: Intellectual Problem Solving: Decreased initiation;Requires verbal cues;Slow processing        Exercises Total Joint Exercises Long Arc Quad: AROM;Both;Seated;10 reps General Exercises - Lower Extremity Ankle Circles/Pumps: AROM;10 reps;Both;Supine Heel Slides: Both;Supine;AROM;10 reps    General Comments General comments (skin integrity, edema, etc.): husband present today       Pertinent Vitals/Pain Pain Assessment: Faces Faces Pain Scale: Hurts little more Pain Location: all over Pain Descriptors / Indicators: Aching;Grimacing Pain Intervention(s): Limited activity within patient's tolerance;Monitored during session;Repositioned;Premedicated before session    Home Living  Prior Function            PT Goals (current goals can now be found in the care plan section) Acute Rehab PT Goals PT Goal Formulation: With  patient Time For Goal Achievement: 08/19/16 Potential to Achieve Goals: Good Progress towards PT goals: Progressing toward goals    Frequency    Min 3X/week      PT Plan Current plan remains appropriate    Co-evaluation              AM-PAC PT "6 Clicks" Daily Activity  Outcome Measure  Difficulty turning over in bed (including adjusting bedclothes, sheets and blankets)?: A Lot Difficulty moving from lying on back to sitting on the side of the bed? : A Little Difficulty sitting down on and standing up from a chair with arms (e.g., wheelchair, bedside commode, etc,.)?: A Little Help needed moving to and from a bed to chair (including a wheelchair)?: A Little Help needed walking in hospital room?: A Little Help needed climbing 3-5 steps with a railing? : A Little 6 Click Score: 17    End of Session Equipment Utilized During Treatment: Gait belt;Oxygen Activity Tolerance: Patient tolerated treatment well Patient left: with call bell/phone within reach;in chair;with family/visitor present Nurse Communication: Mobility status PT Visit Diagnosis: Muscle weakness (generalized) (M62.81);Other abnormalities of gait and mobility (R26.89)     Time: 1152-1207 PT Time Calculation (min) (ACUTE ONLY): 15 min  Charges:  $Gait Training: 8-22 mins                    G Codes:       Haley Graham,PT Acute Rehabilitation [phone] (pager)    Haley Graham 08/05/2016, 1:08 PM

## 2016-08-05 NOTE — Progress Notes (Signed)
Patient ID: Haley Graham, female   DOB: Jan 06, 1956, 61 y.o.   MRN: [id number] TCTS DAILY ICU PROGRESS NOTE                   [address]            [place] [address]          [phone]   12 Days Post-Op Procedure(s) (LRB): ARCH AND ABDOMINAL AORTA  ANGIOGRAM (N/A)  Total Length of Stay:  LOS: 46 days   Subjective: Some nausea this am  Objective: Vital signs in last 24 hours: Temp:  [97.5 F (36.4 C)-98.3 F (36.8 C)] 98.1 F (36.7 C) (07/04 0821) Pulse Rate:  [87-103] 100 (07/04 0800) Cardiac Rhythm: Normal sinus rhythm (07/04 0800) Resp:  [18-28] 18 (07/04 0800) BP: (92-131)/(43-69) 127/53 (07/04 0800) SpO2:  [91 %-99 %] 96 % (07/04 0809) Weight:  [180 lb 5.4 oz (81.8 kg)] 180 lb 5.4 oz (81.8 kg) (07/04 0500)  Filed Weights   08/02/16 0300 08/04/16 0906 08/05/16 0500  Weight: 179 lb 3.7 oz (81.3 kg) 181 lb (82.1 kg) 180 lb 5.4 oz (81.8 kg)    Weight change:    Hemodynamic parameters for last 24 hours:    Intake/Output from previous day: 07/03 0701 - 07/04 0700 In: 3384.6 [P.O.:340; I.V.:2571.3; NG/GT:373.3; IV Piggyback:100] Out: 1250 [Urine:1250]  Intake/Output this shift: Total I/O In: 257.7 [I.V.:93; NG/GT:114.7; IV Piggyback:50] Out: -   Current Meds: Scheduled Meds: . arformoterol  15 mcg Nebulization BID  . budesonide (PULMICORT) nebulizer solution  0.5 mg Nebulization BID  . Chlorhexidine Gluconate Cloth  6 each Topical Q0600  . citalopram  40 mg Oral Daily  . clonazePAM  1 mg Oral QHS  . feeding supplement  1 Container Oral BID BM  . furosemide  40 mg Oral Daily  . insulin aspart  0-24 Units Subcutaneous Q6H  . magic mouthwash  5 mL Oral QID  . metoprolol tartrate  25 mg Oral BID  . pantoprazole  40 mg Oral Q1200  . potassium chloride  40 mEq Per Tube TID  . sodium chloride flush  10-40 mL Intracatheter Q12H   Continuous Infusions: . sodium chloride    . sodium chloride 10 mL/hr at 08/05/16 0700  . ceFEPime (MAXIPIME)  IV 1 g (08/05/16 0935)  . feeding supplement (VITAL 1.5 CAL) Stopped (08/05/16 0814)  . Marland KitchenTPN (CLINIMIX-E) Adult 83 mL/hr at 08/05/16 0700   PRN Meds:.Place/Maintain arterial line **AND** sodium chloride, acetaminophen **OR** acetaminophen, ALPRAZolam, fentaNYL (SUBLIMAZE) injection, Haley Graham's butt cream, HYDROmorphone, labetalol, ondansetron (ZOFRAN) IV, phenol, pneumococcal 23 valent vaccine, simethicone, sodium chloride flush  General appearance: alert and cooperative Neurologic: intact Heart: regular rate and rhythm, S1, S2 normal, no murmur, click, rub or gallop Lungs: diminished breath sounds bibasilar Abdomen: soft, non-tender; bowel sounds normal; no masses,  no organomegaly, mild distention Extremities: extremities normal, atraumatic, no cyanosis or edema and Homans sign is negative, no sign of DVT  Lab Results: CBC: Recent Labs  08/04/16 0430 08/05/16 0416  WBC 22.4* 17.9*  HGB 9.0* 8.8*  HCT 29.0* 28.7*  PLT 228 219   BMET:  Recent Labs  08/04/16 0430 08/05/16 0416  NA 128* 130*  K 3.5 3.3*  CL 88* 92*  CO2 33* 31  GLUCOSE 125* 126*  BUN 31* 28*  CREATININE 0.98 0.97  CALCIUM 7.9* 7.6*    CMET: Lab Results  Component Value Date   WBC 17.9 (H) 08/05/2016   HGB 8.8 (  L) 08/05/2016   HCT 28.7 (L) 08/05/2016   PLT 219 08/05/2016   GLUCOSE 126 (H) 08/05/2016   TRIG 179 (H) 08/03/2016   ALT 13 (L) 08/03/2016   AST 20 08/03/2016   NA 130 (L) 08/05/2016   K 3.3 (L) 08/05/2016   CL 92 (L) 08/05/2016   CREATININE 0.97 08/05/2016   BUN 28 (H) 08/05/2016   CO2 31 08/05/2016   INR 1.08 07/29/2016      PT/INR: No results for input(s): LABPROT, INR in the last 72 hours. Radiology: Ct Angio Chest/abd/pel For Dissection W And/or W/wo  Addendum Date: 08/05/2016   ADDENDUM REPORT: 08/05/2016 07:59 ADDENDUM: These results were called by telephone at the time of interpretation on 08/05/2016 at 7:59 am to Dr. Lianne Bushy , who verbally acknowledged these results.  Electronically Signed   By: Marin Olp M.D.   On: 08/05/2016 07:59   Result Date: 08/05/2016 CLINICAL DATA:  Aortic dissection. EXAM: CT ANGIOGRAPHY CHEST, ABDOMEN AND PELVIS TECHNIQUE: Multidetector CT imaging through the chest, abdomen and pelvis was performed using the standard protocol during bolus administration of intravenous contrast. Multiplanar reconstructed images and MIPs were obtained and reviewed to evaluate the vascular anatomy. CONTRAST:  100 mL Isovue 370 IV. COMPARISON:  07/18/2016 and 06/27/2016 FINDINGS: CTA CHEST FINDINGS Right-sided PICC line has tip in the SVC. Cardiovascular: Borderline cardiomegaly unchanged. No significant pericardial fluid. Ascending aorta is normal in caliber without dissection. There is again evidence patient's known type B aortic dissection as the distal large demonstrates mild dilatation measuring 3.7 cm (previously 3.5 cm) which may be due to the slight interval progression intramural hematoma/thrombus. There is again opacification of the true or false lumen within the thorax. Single fenestration noted within the dissection over the distal thoracic aorta unchanged. No involvement of the great vessels by the dissection. Pulmonary arterial system is within normal without evidence of emboli. Mediastinum/Nodes: No evidence of mediastinal or hilar adenopathy. Remaining mediastinal structures are within normal. Lungs/Pleura: Lungs are adequately inflated and continues demonstrate a small to moderate left pleural effusion with associated compressive atelectasis in the left base without significant change. Slight worsening heterogeneous consolidation over the right base likely compressive atelectasis and less likely developing infection. Debris within the dependent portion of the distal trachea and origin of the right mainstem bronchus likely due to aspiration. Minimal linear atelectasis over the medial right midlung. Musculoskeletal: Unremarkable. Review of the MIP images  confirms the above findings. CTA ABDOMEN AND PELVIS FINDINGS VASCULAR Aorta: Stanford B dissection continues throughout the abdominal aorta to the bifurcation and into the iliac vessels. Both true and false lumens are opacified in the proximal abdominal aorta with the false lumen dominant. The false lumen becomes thrombosed immediately after takeoff of the superior mesenteric artery unchanged. Thrombosis of the false lumen extends to the bifurcation. Celiac: Opacified normally from the false lumen of the aortic dissection. SMA: Opacified normally from the false lumen of the aortic dissection. Renals: Right renal artery is normally opacified from the false lumen of the aortic dissection. Thrombosed/ un opacified left renal artery from the false lumen of the aortic dissection unchanged from 06/27/2016. IMA: IMA is patent which appears to arise between the junction of the true and false lumens. Inflow: Type B dissection continues into the iliac arteries to the level of the iliac bifurcations bilaterally with some extension into the prox left internal iliac artery. Veins: Within normal. Review of the MIP images confirms the above findings. NON-VASCULAR Hepatobiliary: Previous cholecystectomy. Liver and biliary tree  are within normal. Pancreas: Within normal. Spleen: Within normal. 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 Normal right adrenal gland. Right kidney is within normal. Left kidney is slightly smaller with patchy areas of decreased cortical perfusion compatible with areas of infarction. This is unchanged. Ureters and bladder are normal. Stomach/Bowel: Nasogastric tube is present with tip over the proximal jejunum. Stomach is within normal. There are several air and fluid-filled minimally dilated small bowel loops without significant change likely ileus. Appendix is normal. Colon is air and fluid-filled without wall thickening or pneumatosis. Lymphatic: Within normal. Reproductive: Previous  hysterectomy. Other: Small amount of nonspecific free fluid over the right pericolic gutter slightly worse. Musculoskeletal: Mild degenerative change of the spine. Disc space narrowing at the L3-4 level. Mild degenerate change of the sacroiliac joints right worse than left. Review of the MIP images confirms the above findings. 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 Possible subtle progressive dilatation of the distal arch measuring 3.7 cm (previously 3.5 cm) which again may be due to subtle expansion of thrombus/intramural hematoma. Single fenestration again demonstrated just above the diaphragm within the distal thoracic aorta. Abdominal portion of the dissection demonstrates dominant false lumen which is opacified. True lumen becomes thrombosis immediately distal to the takeoff of the superior mesenteric artery as this is unchanged. 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 Dissection extends into the iliac arteries to the level of the iliac bifurcation bilaterally with slight extension to the proximal left internal iliac artery. Stable small to moderate left pleural effusion with compressive atelectasis in the left base. Slight worsening heterogeneous consolidation within the right base likely compressive atelectasis although cannot exclude developing infection. Aspirate material noted within the distal trachea and origin of the right mainstem bronchus. Few mildly dilated air and fluid-filled small bowel loops which are stable and likely related to ileus. 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 Tubes and lines as described. Electronically Signed: By: Marin Olp M.D. On: 08/05/2016 07:49     Assessment/Plan: S/P Procedure(s) (LRB): ARCH AND ABDOMINAL AORTA  ANGIOGRAM (N/A) Wbc 17,000 , on ct last pm  worsening heterogeneous consolidation within the right base likely compressive atelectasis although cannot exclude developing  infection.- on Maxipime  Cr stable  No fever this am hypokalemic - replaced   [name] 08/05/2016 9:48 AM

## 2016-08-05 NOTE — Progress Notes (Signed)
Pt for possible surgery tomorrow, however, per Dr. Stephens Shire note, he is trending WBC and would not recommend TEVAR in setting of active infection.  Will make pt npo tonight and he can evaluate pt in the am.   Leontine Locket, [facility] 08/05/2016 9:49 AM

## 2016-08-06 ENCOUNTER — Inpatient Hospital Stay (HOSPITAL_COMMUNITY): Payer: BLUE CROSS/BLUE SHIELD | Admitting: Anesthesiology

## 2016-08-06 ENCOUNTER — Encounter (HOSPITAL_COMMUNITY)
Admission: EM | Disposition: A | Discharge status: Home Health | Payer: Self-pay | Source: Home / Self Care | Attending: Cardiothoracic Surgery

## 2016-08-06 ENCOUNTER — Encounter (HOSPITAL_COMMUNITY): Payer: Self-pay | Admitting: Anesthesiology

## 2016-08-06 ENCOUNTER — Inpatient Hospital Stay (HOSPITAL_COMMUNITY): Payer: BLUE CROSS/BLUE SHIELD

## 2016-08-06 DIAGNOSIS — I714 Abdominal aortic aneurysm, without rupture: Secondary | ICD-10-CM

## 2016-08-06 HISTORY — PX: THORACIC AORTIC ENDOVASCULAR STENT GRAFT: SHX6112

## 2016-08-06 LAB — POCT I-STAT 7, (LYTES, BLD GAS, ICA,H+H)
Acid-Base Excess: 4 mmol/L — ABNORMAL HIGH (ref 0.0–2.0)
Bicarbonate: 28.3 mmol/L — ABNORMAL HIGH (ref 20.0–28.0)
Calcium, Ion: 1.03 mmol/L — ABNORMAL LOW (ref 1.15–1.40)
HCT: 21 % — ABNORMAL LOW (ref 36.0–46.0)
Hemoglobin: 7.1 g/dL — ABNORMAL LOW (ref 12.0–15.0)
O2 Saturation: 100 %
Potassium: 4 mmol/L (ref 3.5–5.1)
Sodium: 134 mmol/L — ABNORMAL LOW (ref 135–145)
TCO2: 29 mmol/L (ref 0–100)
pCO2 arterial: 38.8 mmHg (ref 32.0–48.0)
pH, Arterial: 7.471 — ABNORMAL HIGH (ref 7.350–7.450)
pO2, Arterial: 166 mmHg — ABNORMAL HIGH (ref 83.0–108.0)

## 2016-08-06 LAB — GLUCOSE, CAPILLARY
Glucose-Capillary: 112 mg/dL — ABNORMAL HIGH (ref 65–99)
Glucose-Capillary: 105 mg/dL — ABNORMAL HIGH (ref 65–99)
Glucose-Capillary: 113 mg/dL — ABNORMAL HIGH (ref 65–99)
Glucose-Capillary: 177 mg/dL — ABNORMAL HIGH (ref 65–99)

## 2016-08-06 LAB — URINALYSIS, ROUTINE W REFLEX MICROSCOPIC
Bacteria, UA: NONE SEEN
Bilirubin Urine: NEGATIVE
GLUCOSE, UA: NEGATIVE mg/dL
Hgb urine dipstick: NEGATIVE
Ketones, ur: NEGATIVE mg/dL
Leukocytes, UA: NEGATIVE
Nitrite: NEGATIVE
PROTEIN: 30 mg/dL — AB
SQUAMOUS EPITHELIAL / LPF: NONE SEEN
Specific Gravity, Urine: 1.024 (ref 1.005–1.030)
pH: 6 (ref 5.0–8.0)

## 2016-08-06 LAB — COMPREHENSIVE METABOLIC PANEL
ALBUMIN: 1.5 g/dL — AB (ref 3.5–5.0)
ALK PHOS: 94 U/L (ref 38–126)
ALT: 12 U/L — ABNORMAL LOW (ref 14–54)
ANION GAP: 6 (ref 5–15)
AST: 18 U/L (ref 15–41)
BUN: 27 mg/dL — ABNORMAL HIGH (ref 6–20)
CALCIUM: 8.1 mg/dL — AB (ref 8.9–10.3)
CO2: 30 mmol/L (ref 22–32)
Chloride: 98 mmol/L — ABNORMAL LOW (ref 101–111)
Creatinine, Ser: 0.97 mg/dL (ref 0.44–1.00)
GFR calc non Af Amer: >60 mL/min (ref >60)
GLUCOSE: 94 mg/dL (ref 65–99)
POTASSIUM: 4.4 mmol/L (ref 3.5–5.1)
SODIUM: 134 mmol/L — AB (ref 135–145)
TOTAL PROTEIN: 6.1 g/dL — AB (ref 6.5–8.1)
Total Bilirubin: 1 mg/dL (ref 0.3–1.2)

## 2016-08-06 LAB — CBC
HCT: 27.1 % — ABNORMAL LOW (ref 36.0–46.0)
Hemoglobin: 8.4 g/dL — ABNORMAL LOW (ref 12.0–15.0)
MCH: 30.4 pg (ref 26.0–34.0)
MCHC: 31 g/dL (ref 30.0–36.0)
MCV: 98.2 fL (ref 78.0–100.0)
Platelets: 214 10*3/uL (ref 150–400)
RBC: 2.76 MIL/uL — ABNORMAL LOW (ref 3.87–5.11)
RDW: 16.5 % — ABNORMAL HIGH (ref 11.5–15.5)
WBC: 18.2 10*3/uL — ABNORMAL HIGH (ref 4.0–10.5)

## 2016-08-06 LAB — POCT I-STAT 4, (NA,K, GLUC, HGB,HCT)
Glucose, Bld: 106 mg/dL — ABNORMAL HIGH (ref 65–99)
HCT: 22 % — ABNORMAL LOW (ref 36.0–46.0)
Hemoglobin: 7.5 g/dL — ABNORMAL LOW (ref 12.0–15.0)
Potassium: 4 mmol/L (ref 3.5–5.1)
Sodium: 134 mmol/L — ABNORMAL LOW (ref 135–145)

## 2016-08-06 LAB — POCT ACTIVATED CLOTTING TIME
Activated Clotting Time: 197 seconds
Activated Clotting Time: 230 seconds

## 2016-08-06 LAB — PHOSPHORUS: Phosphorus: 4 mg/dL (ref 2.5–4.6)

## 2016-08-06 LAB — MAGNESIUM: MAGNESIUM: 1.7 mg/dL (ref 1.7–2.4)

## 2016-08-06 SURGERY — INSERTION, ENDOVASCULAR STENT GRAFT, AORTA, THORACIC
Anesthesia: General | Site: Abdomen

## 2016-08-06 MED ORDER — SODIUM CHLORIDE 0.9 % IV SOLN
INTRAVENOUS | Status: DC | PRN
  Administered 2016-08-06 (×2)

## 2016-08-06 MED ORDER — LIDOCAINE 2% (20 MG/ML) 5 ML SYRINGE
INTRAMUSCULAR | Status: DC | PRN
  Administered 2016-08-06: 60 mg via INTRAVENOUS

## 2016-08-06 MED ORDER — TRACE MINERALS CR-CU-MN-SE-ZN 10-1000-500-60 MCG/ML IV SOLN
INTRAVENOUS | Status: AC
  Administered 2016-08-06: 23:00:00 via INTRAVENOUS
  Filled 2016-08-06: qty 1680

## 2016-08-06 MED ORDER — LABETALOL HCL 5 MG/ML IV SOLN
INTRAVENOUS | Status: AC
  Administered 2016-08-06: 5 mg via INTRAVENOUS
  Filled 2016-08-06: qty 4

## 2016-08-06 MED ORDER — FENTANYL CITRATE (PF) 100 MCG/2ML IJ SOLN: 25.0000 ug | INTRAMUSCULAR | Status: DC | PRN

## 2016-08-06 MED ORDER — METOPROLOL TARTRATE 5 MG/5ML IV SOLN
INTRAVENOUS | Status: DC | PRN
  Administered 2016-08-06: 3 mg via INTRAVENOUS
  Administered 2016-08-06: 2 mg via INTRAVENOUS

## 2016-08-06 MED ORDER — HEMOSTATIC AGENTS (NO CHARGE) OPTIME
TOPICAL | Status: DC | PRN
  Administered 2016-08-06: 1 via TOPICAL

## 2016-08-06 MED ORDER — LACTATED RINGERS IV SOLN
INTRAVENOUS | Status: DC | PRN
  Administered 2016-08-06 (×2): via INTRAVENOUS

## 2016-08-06 MED ORDER — MEPERIDINE HCL 25 MG/ML IJ SOLN: 6.2500 mg | INTRAMUSCULAR | Status: DC | PRN

## 2016-08-06 MED ORDER — SODIUM CHLORIDE 0.9% FLUSH: 10.0000 mL | INTRAVENOUS | Status: DC | PRN

## 2016-08-06 MED ORDER — LABETALOL HCL 5 MG/ML IV SOLN
5.0000 mg | INTRAVENOUS | Status: DC | PRN
  Administered 2016-08-06: 5 mg via INTRAVENOUS

## 2016-08-06 MED ORDER — DOCUSATE SODIUM 100 MG PO CAPS
100.0000 mg | ORAL_CAPSULE | Freq: Every day | ORAL | Status: DC
  Administered 2016-08-08 – 2016-08-22 (×11): 100 mg via ORAL
  Filled 2016-08-06 (×12): qty 1

## 2016-08-06 MED ORDER — HYDROMORPHONE HCL 2 MG PO TABS
1.0000 mg | ORAL_TABLET | Freq: Three times a day (TID) | ORAL | Status: DC | PRN
  Administered 2016-08-06 – 2016-08-08 (×3): 1 mg
  Filled 2016-08-06 (×2): qty 1

## 2016-08-06 MED ORDER — ROCURONIUM BROMIDE 50 MG/5ML IV SOLN
INTRAVENOUS | Status: AC
  Filled 2016-08-06: qty 2

## 2016-08-06 MED ORDER — FENTANYL CITRATE (PF) 250 MCG/5ML IJ SOLN
INTRAMUSCULAR | Status: AC
  Filled 2016-08-06: qty 5

## 2016-08-06 MED ORDER — SODIUM CHLORIDE 0.9% FLUSH
10.0000 mL | Freq: Two times a day (BID) | INTRAVENOUS | Status: DC
  Administered 2016-08-07 – 2016-08-08 (×3): 10 mL
  Administered 2016-08-08: 20 mL
  Administered 2016-08-09 – 2016-08-11 (×6): 10 mL
  Administered 2016-08-12: 20 mL
  Administered 2016-08-12 – 2016-08-13 (×3): 10 mL
  Administered 2016-08-14: 30 mL
  Administered 2016-08-14: 20 mL
  Administered 2016-08-15 – 2016-08-17 (×4): 10 mL
  Administered 2016-08-17: 30 mL
  Administered 2016-08-18 – 2016-08-21 (×7): 10 mL

## 2016-08-06 MED ORDER — METOPROLOL TARTRATE 5 MG/5ML IV SOLN: 2.0000 mg | INTRAVENOUS | Status: DC | PRN

## 2016-08-06 MED ORDER — PROPOFOL 10 MG/ML IV BOLUS
INTRAVENOUS | Status: DC | PRN
  Administered 2016-08-06: 120 mg via INTRAVENOUS

## 2016-08-06 MED ORDER — POTASSIUM CHLORIDE CRYS ER 20 MEQ PO TBCR
20.0000 meq | EXTENDED_RELEASE_TABLET | Freq: Every day | ORAL | Status: AC | PRN
  Administered 2016-08-08: 40 meq via ORAL
  Filled 2016-08-06: qty 2

## 2016-08-06 MED ORDER — FENTANYL CITRATE (PF) 250 MCG/5ML IJ SOLN
INTRAMUSCULAR | Status: DC | PRN
  Administered 2016-08-06 (×3): 50 ug via INTRAVENOUS
  Administered 2016-08-06: 100 ug via INTRAVENOUS
  Administered 2016-08-06: 50 ug via INTRAVENOUS

## 2016-08-06 MED ORDER — ENOXAPARIN SODIUM 40 MG/0.4ML ~~LOC~~ SOLN: 40.0000 mg | SUBCUTANEOUS | Status: DC

## 2016-08-06 MED ORDER — IODIXANOL 320 MG/ML IV SOLN
INTRAVENOUS | Status: DC | PRN
  Administered 2016-08-06: 87.2 mL via INTRAVENOUS

## 2016-08-06 MED ORDER — PHENYLEPHRINE HCL 10 MG/ML IJ SOLN
INTRAVENOUS | Status: DC | PRN
  Administered 2016-08-06: 15 ug/min via INTRAVENOUS

## 2016-08-06 MED ORDER — HYDRALAZINE HCL 20 MG/ML IJ SOLN
5.0000 mg | INTRAMUSCULAR | Status: AC | PRN
  Administered 2016-08-06 – 2016-08-08 (×2): 5 mg via INTRAVENOUS
  Filled 2016-08-06 (×2): qty 1

## 2016-08-06 MED ORDER — MAGNESIUM SULFATE 2 GM/50ML IV SOLN
2.0000 g | Freq: Every day | INTRAVENOUS | Status: AC | PRN
  Administered 2016-08-14: 2 g via INTRAVENOUS
  Filled 2016-08-06: qty 50

## 2016-08-06 MED ORDER — CHLORHEXIDINE GLUCONATE CLOTH 2 % EX PADS
6.0000 | MEDICATED_PAD | Freq: Every day | CUTANEOUS | Status: DC
  Administered 2016-08-07 – 2016-08-21 (×16): 6 via TOPICAL

## 2016-08-06 MED ORDER — VANCOMYCIN HCL IN DEXTROSE 1-5 GM/200ML-% IV SOLN
1000.0000 mg | INTRAVENOUS | Status: AC
  Administered 2016-08-06: 1000 mg via INTRAVENOUS
  Filled 2016-08-06: qty 200

## 2016-08-06 MED ORDER — DEXTROSE 10 % IV SOLN
INTRAVENOUS | Status: AC
  Administered 2016-08-06: 12:00:00 via INTRAVENOUS

## 2016-08-06 MED ORDER — SUGAMMADEX SODIUM 200 MG/2ML IV SOLN
INTRAVENOUS | Status: DC | PRN
  Administered 2016-08-06: 200 mg via INTRAVENOUS

## 2016-08-06 MED ORDER — MIDAZOLAM HCL 5 MG/5ML IJ SOLN
INTRAMUSCULAR | Status: DC | PRN
  Administered 2016-08-06: 1 mg via INTRAVENOUS

## 2016-08-06 MED ORDER — MIDAZOLAM HCL 2 MG/2ML IJ SOLN
INTRAMUSCULAR | Status: AC
  Filled 2016-08-06: qty 2

## 2016-08-06 MED ORDER — LABETALOL HCL 5 MG/ML IV SOLN
10.0000 mg | INTRAVENOUS | Status: DC | PRN
  Administered 2016-08-07: 10 mg via INTRAVENOUS
  Filled 2016-08-06: qty 4

## 2016-08-06 MED ORDER — HEPARIN SODIUM (PORCINE) 1000 UNIT/ML IJ SOLN
INTRAMUSCULAR | Status: AC
  Filled 2016-08-06: qty 2

## 2016-08-06 MED ORDER — GUAIFENESIN-DM 100-10 MG/5ML PO SYRP: 15.0000 mL | ORAL_SOLUTION | ORAL | Status: DC | PRN

## 2016-08-06 MED ORDER — LACTATED RINGERS IV SOLN
INTRAVENOUS | Status: DC
  Administered 2016-08-06 (×2): via INTRAVENOUS

## 2016-08-06 MED ORDER — PROTAMINE SULFATE 10 MG/ML IV SOLN
INTRAVENOUS | Status: DC | PRN
Start: 2016-08-06 — End: 2016-08-06
  Administered 2016-08-06: 50 mg via INTRAVENOUS

## 2016-08-06 MED ORDER — ENOXAPARIN SODIUM 40 MG/0.4ML ~~LOC~~ SOLN
40.0000 mg | SUBCUTANEOUS | Status: DC
  Administered 2016-08-07 – 2016-08-21 (×15): 40 mg via SUBCUTANEOUS
  Filled 2016-08-06 (×15): qty 0.4

## 2016-08-06 MED ORDER — ACETAMINOPHEN 325 MG PO TABS
325.0000 mg | ORAL_TABLET | ORAL | Status: DC | PRN
  Administered 2016-08-16 – 2016-08-22 (×6): 650 mg via ORAL
  Filled 2016-08-06 (×6): qty 2

## 2016-08-06 MED ORDER — LACTATED RINGERS IV SOLN
INTRAVENOUS | Status: DC | PRN
  Administered 2016-08-06: 17:00:00 via INTRAVENOUS

## 2016-08-06 MED ORDER — ACETAMINOPHEN 325 MG RE SUPP
325.0000 mg | RECTAL | Status: DC | PRN
  Filled 2016-08-06: qty 2

## 2016-08-06 MED ORDER — PANTOPRAZOLE SODIUM 40 MG PO TBEC: 40.0000 mg | DELAYED_RELEASE_TABLET | Freq: Every day | ORAL | Status: DC

## 2016-08-06 MED ORDER — 0.9 % SODIUM CHLORIDE (POUR BTL) OPTIME
TOPICAL | Status: DC | PRN
  Administered 2016-08-06: 1000 mL

## 2016-08-06 MED ORDER — HEPARIN SODIUM (PORCINE) 1000 UNIT/ML IJ SOLN
INTRAMUSCULAR | Status: DC | PRN
  Administered 2016-08-06: 8000 [IU] via INTRAVENOUS
  Administered 2016-08-06: 3000 [IU] via INTRAVENOUS
  Administered 2016-08-06: 1000 [IU] via INTRAVENOUS

## 2016-08-06 MED ORDER — MAGNESIUM SULFATE 2 GM/50ML IV SOLN
2.0000 g | Freq: Once | INTRAVENOUS | Status: AC
  Administered 2016-08-06: 2 g via INTRAVENOUS
  Filled 2016-08-06: qty 50

## 2016-08-06 MED ORDER — PROMETHAZINE HCL 25 MG/ML IJ SOLN: 6.2500 mg | INTRAMUSCULAR | Status: DC | PRN

## 2016-08-06 MED ORDER — ONDANSETRON HCL 4 MG/2ML IJ SOLN: 4.0000 mg | Freq: Four times a day (QID) | INTRAMUSCULAR | Status: DC | PRN

## 2016-08-06 MED ORDER — SODIUM CHLORIDE 0.9 % IV SOLN: 500.0000 mL | Freq: Once | INTRAVENOUS | Status: DC | PRN

## 2016-08-06 MED ORDER — DEXAMETHASONE SODIUM PHOSPHATE 10 MG/ML IJ SOLN
INTRAMUSCULAR | Status: DC | PRN
  Administered 2016-08-06: 8 mg via INTRAVENOUS

## 2016-08-06 MED ORDER — ALPRAZOLAM 0.25 MG PO TABS
0.2500 mg | ORAL_TABLET | Freq: Two times a day (BID) | ORAL | Status: DC | PRN
  Administered 2016-08-06 – 2016-08-07 (×3): 0.25 mg
  Filled 2016-08-06 (×3): qty 1

## 2016-08-06 MED ORDER — SUCCINYLCHOLINE CHLORIDE 20 MG/ML IJ SOLN
INTRAMUSCULAR | Status: DC | PRN
  Administered 2016-08-06: 120 mg via INTRAVENOUS

## 2016-08-06 MED ORDER — PHENOL 1.4 % MT LIQD: 1.0000 | OROMUCOSAL | Status: DC | PRN

## 2016-08-06 MED ORDER — ALUM & MAG HYDROXIDE-SIMETH 200-200-20 MG/5ML PO SUSP
15.0000 mL | ORAL | Status: DC | PRN
  Administered 2016-08-09 – 2016-08-22 (×9): 30 mL via ORAL
  Filled 2016-08-06 (×10): qty 30

## 2016-08-06 MED ORDER — HEPARIN SODIUM (PORCINE) 1000 UNIT/ML IJ SOLN
INTRAMUSCULAR | Status: AC
  Filled 2016-08-06: qty 1

## 2016-08-06 MED ORDER — LACTATED RINGERS IV SOLN: INTRAVENOUS | Status: DC

## 2016-08-06 MED ORDER — ROCURONIUM BROMIDE 10 MG/ML (PF) SYRINGE
PREFILLED_SYRINGE | INTRAVENOUS | Status: DC | PRN
  Administered 2016-08-06: 50 mg via INTRAVENOUS

## 2016-08-06 MED ORDER — PROPOFOL 10 MG/ML IV BOLUS
INTRAVENOUS | Status: AC
  Filled 2016-08-06: qty 20

## 2016-08-06 SURGICAL SUPPLY — 87 items
ADH SKN CLS APL DERMABOND .7 (GAUZE/BANDAGES/DRESSINGS) ×1
BAG SNAP BAND KOVER 36X36 (MISCELLANEOUS) ×2 IMPLANT
BNDG COHESIVE 3X5 TAN STRL LF (GAUZE/BANDAGES/DRESSINGS) ×2 IMPLANT
CANISTER SUCT 3000ML PPV (MISCELLANEOUS) ×3 IMPLANT
CATH ACCU-VU SIZ PIG 5F 100CM (CATHETERS) ×2 IMPLANT
CATH ANGIO 5F BER2 100CM (CATHETERS) ×4 IMPLANT
CATH ANGIO 5F BER2 65CM (CATHETERS) ×2 IMPLANT
CATH QUICKCROSS .035X135CM (MICROCATHETER) ×2 IMPLANT
CATH VISIONS PV .035 IVUS (CATHETERS) ×2 IMPLANT
COVER PROBE W GEL 5X96 (DRAPES) ×3 IMPLANT
DERMABOND ADVANCED (GAUZE/BANDAGES/DRESSINGS) ×2
DERMABOND ADVANCED .7 DNX12 (GAUZE/BANDAGES/DRESSINGS) ×1 IMPLANT
DEVICE CLOSURE PERCLS PRGLD 6F (VASCULAR PRODUCTS) IMPLANT
DEVICE ENSNARE  12MMX20MM (VASCULAR PRODUCTS) ×2
DEVICE ENSNARE 12MMX20MM (VASCULAR PRODUCTS) IMPLANT
DEVICE TORQUE KENDALL .025-038 (MISCELLANEOUS) ×2 IMPLANT
DRAPE EXTREMITY T 121X128X90 (DRAPE) ×2 IMPLANT
DRAPE HALF SHEET 40X57 (DRAPES) ×2 IMPLANT
DRAPE INCISE IOBAN 66X45 STRL (DRAPES) ×2 IMPLANT
DRAPE ZERO GRAVITY STERILE (DRAPES) ×3 IMPLANT
DRESSING PREVENA PLUS CUSTOM (GAUZE/BANDAGES/DRESSINGS) IMPLANT
DRSG PREVENA PLUS CUSTOM (GAUZE/BANDAGES/DRESSINGS) ×3
DRSG TEGADERM 2-3/8X2-3/4 SM (GAUZE/BANDAGES/DRESSINGS) ×3 IMPLANT
DRSG TEGADERM 4X4.75 (GAUZE/BANDAGES/DRESSINGS) ×2 IMPLANT
DRYSEAL FLEXSHEATH 22FR 33CM (SHEATH) ×2
DRYSEAL FLEXSHEATH 24FR 33CM (SHEATH) ×2
ELECT CAUTERY BLADE 6.4 (BLADE) ×3 IMPLANT
ELECT REM PT RETURN 9FT ADLT (ELECTROSURGICAL) ×6
ELECTRODE REM PT RTRN 9FT ADLT (ELECTROSURGICAL) ×2 IMPLANT
ENDOPROSTHESIS THORAC 34X34X10 (Endovascular Graft) IMPLANT
ENDOPROSTHESIS THORAC 37X37X10 (Endovascular Graft) IMPLANT
ENDOPROTH THORACIC 34X34X10 (Endovascular Graft) ×3 IMPLANT
ENDOPROTH THORACIC 37X37X10 (Endovascular Graft) ×3 IMPLANT
GAUZE SPONGE 2X2 8PLY STRL LF (GAUZE/BANDAGES/DRESSINGS) IMPLANT
GLOVE BIOGEL PI IND STRL 7.5 (GLOVE) ×1 IMPLANT
GLOVE BIOGEL PI INDICATOR 7.5 (GLOVE) ×2
GLOVE SURG SS PI 7.5 STRL IVOR (GLOVE) ×3 IMPLANT
GOWN STRL REUS W/ TWL LRG LVL3 (GOWN DISPOSABLE) ×2 IMPLANT
GOWN STRL REUS W/ TWL XL LVL3 (GOWN DISPOSABLE) ×1 IMPLANT
GOWN STRL REUS W/TWL LRG LVL3 (GOWN DISPOSABLE) ×6
GOWN STRL REUS W/TWL XL LVL3 (GOWN DISPOSABLE) ×9
GRAFT BALLN CATH 65CM (STENTS) IMPLANT
GUIDEWIRE ANGLED .035X260CM (WIRE) ×2 IMPLANT
HEMOSTAT SNOW SURGICEL 2X4 (HEMOSTASIS) ×2 IMPLANT
KIT BASIN OR (CUSTOM PROCEDURE TRAY) ×3 IMPLANT
KIT ROOM TURNOVER OR (KITS) ×3 IMPLANT
NDL PERC 18GX7CM (NEEDLE) ×1 IMPLANT
NEEDLE PERC 18GX7CM (NEEDLE) ×3 IMPLANT
NS IRRIG 1000ML POUR BTL (IV SOLUTION) ×3 IMPLANT
PACK ENDOVASCULAR (PACKS) ×3 IMPLANT
PAD ARMBOARD 7.5X6 YLW CONV (MISCELLANEOUS) ×6 IMPLANT
PATCH VASC XENOSURE 1CMX6CM (Vascular Products) ×3 IMPLANT
PATCH VASC XENOSURE 1X6 (Vascular Products) IMPLANT
PENCIL BUTTON HOLSTER BLD 10FT (ELECTRODE) ×3 IMPLANT
PERCLOSE PROGLIDE 6F (VASCULAR PRODUCTS)
SHEATH AVANTI 11CM 5FR (MISCELLANEOUS) ×2 IMPLANT
SHEATH AVANTI 11CM 8FR (MISCELLANEOUS) IMPLANT
SHEATH BRITE TIP 8FR 23CM (MISCELLANEOUS) ×2 IMPLANT
SHEATH DRYSEAL FLEX 22FR 33CM (SHEATH) IMPLANT
SHEATH DRYSEAL FLEX 24FR 33CM (SHEATH) IMPLANT
SHIELD RADPAD SCOOP 12X17 (MISCELLANEOUS) ×10 IMPLANT
SLEEVE ISOL F/PACE RF HD COVER (MISCELLANEOUS) ×2 IMPLANT
SPONGE GAUZE 2X2 STER 10/PKG (GAUZE/BANDAGES/DRESSINGS) ×2
STENT GRAFT BALLN CATH 65CM (STENTS)
STOCKINETTE IMPERVIOUS LG (DRAPES) ×2 IMPLANT
STOPCOCK MORSE 400PSI 3WAY (MISCELLANEOUS) ×3 IMPLANT
SUT ETHILON 3 0 PS 1 (SUTURE) IMPLANT
SUT PROLENE 5 0 C 1 24 (SUTURE) IMPLANT
SUT PROLENE 6 0 BV (SUTURE) ×12 IMPLANT
SUT PROLENE 7 0 BV 1 (SUTURE) ×2 IMPLANT
SUT VIC AB 2-0 CT1 27 (SUTURE) ×3
SUT VIC AB 2-0 CT1 TAPERPNT 27 (SUTURE) IMPLANT
SUT VIC AB 3-0 SH 27 (SUTURE) ×6
SUT VIC AB 3-0 SH 27X BRD (SUTURE) IMPLANT
SUT VIC AB 3-0 X1 27 (SUTURE) ×2 IMPLANT
SUT VICRYL 4-0 PS2 18IN ABS (SUTURE) IMPLANT
SYR 30ML LL (SYRINGE) IMPLANT
SYR 50ML LL SCALE MARK (SYRINGE) ×3 IMPLANT
TOWEL GREEN STERILE FF (TOWEL DISPOSABLE) ×2 IMPLANT
TRAY FOLEY W/METER SILVER 16FR (SET/KITS/TRAYS/PACK) ×3 IMPLANT
TUBING ART PRESS 48 MALE/FEM (TUBING) ×2 IMPLANT
TUBING HIGH PRESSURE 120CM (CONNECTOR) ×3 IMPLANT
WIRE AMPLATZ SS-J .035X260CM (WIRE) ×2 IMPLANT
WIRE BENTSON .035X145CM (WIRE) ×2 IMPLANT
WIRE HI TORQ VERSACORE J 260CM (WIRE) ×2 IMPLANT
WIRE MINI STICK MAX (SHEATH) ×2 IMPLANT
WIRE STIFF LUNDERQUIST 260CM (WIRE) ×2 IMPLANT

## 2016-08-06 NOTE — Anesthesia Procedure Notes (Signed)
Central Venous Catheter Insertion Performed by: Roberts Gaudy, anesthesiologist Start/End7/06/2016 8:45 PM, 08/06/2016 8:50 AM Patient location: OR. Preanesthetic checklist: patient identified, IV checked, site marked, risks and benefits discussed, surgical consent, monitors and equipment checked, pre-op evaluation and timeout performed Position: supine Hand hygiene performed , maximum sterile barriers used  and Seldinger technique used Catheter size: 8 Fr Central line was placed.Double lumen Procedure performed using ultrasound guided technique. Ultrasound Notes:anatomy identified Attempts: 1 Following insertion, line sutured, dressing applied and Biopatch. Post procedure assessment: blood return through all ports, free fluid flow and no air  Patient tolerated the procedure well with no immediate complications.

## 2016-08-06 NOTE — Progress Notes (Signed)
Peripherally Inserted Central Catheter/Midline Placement  The IV Nurse has discussed with the patient and/or persons authorized to consent for the patient, the purpose of this procedure and the potential benefits and risks involved with this procedure.  The benefits include less needle sticks, lab draws from the catheter, and the patient may be discharged home with the catheter. Risks include, but not limited to, infection, bleeding, blood clot (thrombus formation), and puncture of an artery; nerve damage and irregular heartbeat and possibility to perform a PICC exchange if needed/ordered by physician.  Alternatives to this procedure were also discussed.  Bard Power PICC patient education guide, fact sheet on infection prevention and patient information card has been provided to patient /or left at bedside.    PICC/Midline Placement Documentation  PICC Triple Lumen 96/75/91 PICC Right Basilic 38 cm 0 cm (Active)  Indication for Insertion or Continuance of Line Administration of hyperosmolar/irritating solutions (i.e. TPN, Vancomycin, etc.) 08/06/2016  8:00 AM  Exposed Catheter (cm) 0 cm 07/28/2016  8:00 AM  Site Assessment Clean;Dry;Intact 08/06/2016  8:00 AM  Lumen #1 Status Infusing 08/06/2016  8:00 AM  Lumen #2 Status Infusing 08/06/2016  8:00 AM  Lumen #3 Status Infusing 08/06/2016  8:00 AM  Dressing Type Transparent;Occlusive;Securing device 08/06/2016  8:00 AM  Dressing Status Clean;Dry;Intact;Antimicrobial disc in place 08/06/2016  8:00 AM  Line Care Connections checked and tightened 08/06/2016  8:00 AM  Dressing Intervention New dressing 08/02/2016  8:00 AM  Dressing Change Due 08/09/16 08/06/2016  8:00 AM     PICC Triple Lumen 63/84/66 PICC Left Basilic 44 cm 1 cm (Active)  Indication for Insertion or Continuance of Line Administration of hyperosmolar/irritating solutions (i.e. TPN, Vancomycin, etc.) 08/06/2016 11:00 AM  Exposed Catheter (cm) 1 cm 08/06/2016 11:00 AM  Dressing Change Due 08/13/16 08/06/2016 11:00  AM    Consent dated 06/27/2016   Jule Economy Horton 08/06/2016, 11:47 AM

## 2016-08-06 NOTE — Progress Notes (Signed)
PHARMACY - ADULT TOTAL PARENTERAL NUTRITION CONSULT NOTE   Pharmacy Consult:  TPN Indication:  Prolonged NPO status/high NG output   Patient Measurements: Height: 5\' 3"  (160 cm) Weight: 180 lb 1.9 oz (81.7 kg) IBW/kg (Calculated) : 52.4 TPN AdjBW (KG): 61.7 Body mass index is 31.91 kg/m. 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 Patient had a significant history of N/V for 2-4 weeks PTA but weight appears to have been maintained. Patient has had several NG tube replacements due to nausea and vomiting.  Pharmacy consulted to manage TPN since 06/23/16.    GI: On TPN for persistent ileus and mesenteric ischemia, LGIB. Bowels now moving. CDiff negative on 6/29. On clear liquids. Appetite is charted as fair but poor intake. Placed Cortrak tube and started on TFs 7/2. TF are currently off as Vascular plans to take to OR today for thoracic aortic endovascular stent but may not with possible active infection. Albumin low at 1.5, prealbumin low at 10.4 but up from 7.4. PPI PO Endo: DM on glipizide/metformin PTA. CBGs mostly controlled now (94-200 yesterday) Did not have insulin in TPN on 6/30. Low of 94 was at 0430 AM today. High of 200 was by FS at noon yesterday. All others within range. Insulin requirements in the past 24 hours: 10 units SSI + 70 units in TPN Lytes: low Na/CL but improving. K at goal today, 4.4 (goal >/= 4 with ileus) and Mag 1.7 s/p 4g on 7/2 and 2g on 7/3 (goal >/= 2 for ileus). CoCa 10, Phos wnl at 4. CaPhos product <55. Has required large amounts of K and Mg replacement each day. On Lasix 40mg  po daily Renal: SCr stable, CrCl ~60ml/min. UOP remains good. BUN down to 28. NS at 5ml/hr Pulm: pleural effusion, 3L [place] (improved requirements). Brovana, Pulmicort, PRN albuterol Cards: aortic dissection stable, unsuccessful TEVAR - BP controlled, tachy. Lopressor, IV Lasix, PRN labetolol Hepatobil: LFTs  normalized, tbili wnl, TG down to 179. Neuro: Celexa, Klonopin, PRN Fentanyl/Dialudid/Xanax/APAP - pain score 0-5 ID: S/p 2 courses of Merrem for PNA, s/p 3d Cipro >> s/p 7d vanc for E.faecalis UTI. Possible PNA with consolidation in R-base on Cefepime day #3.  Afebrile, WBC down to 18.2.  Best Practices: SCDs, CHG, MC  TPN Access: PICC placed 06/21/16; Changed 7/5 >> TPN start date: 06/23/16  Nutritional Goals (per RD recommendation on 7/3): 2000-2200 kCal  110-120 gm protein  Current Nutrition:   TPN Clear liquid diet (tolerating but little intake) Vital 1.5 at 34ml/hr (32g of protein and 740 kcal) - currently on hold for surgery Boost Breeze BID  Plan:  Hold Clinimix with PICC change and hang D10 at 40 mL/hr until new bag at 1800 PM.  Restart Clinimix further to E 5/15 at 70 ml/hr at 1800 PM Stop IV lipid emulsions Follow-up restart of Vital 1.5 at 43ml/hr per MD. Plan to advance to goal of 62ml/hr as tolerated per MD. Continue NS at 10 ml/hr TPN and TF provides 116 g of protein and 1913 kCals per day meeting 100% of protein and 90% kCal needs Continue MVI in TPN Continue TE in TPN every other day, next 7/5 Decrease to 50 units of regular insulin in TPN Continue TCTS SSI Q6h and adjust as needed Monitor TPN labs F/U advancement of TFs, transfer to tertiary center or [place] [facility]   Give another Mg 2g IV x 1 Recheck BMET at  1800 PM and replace K+ as needed.   Sloan Leiter, PharmD, BCPS Clinical Pharmacist Clinical phone 08/06/2016 until 3:30 PM- [phone] After hours, please call [phone] 08/06/2016 7:38 AM

## 2016-08-06 NOTE — Anesthesia Procedure Notes (Signed)
Procedure Name: Intubation Date/Time: 08/06/2016 5:09 PM Performed by: Oletta Lamas Pre-anesthesia Checklist: Patient identified, Emergency Drugs available, Suction available and Patient being monitored Patient Re-evaluated:Patient Re-evaluated prior to inductionOxygen Delivery Method: Circle System Utilized Preoxygenation: Pre-oxygenation with 100% oxygen Intubation Type: IV induction Ventilation: Mask ventilation without difficulty Laryngoscope Size: Glidescope and 3 Grade View: Grade I Tube type: Oral Tube size: 7.0 mm Number of attempts: 1 Airway Equipment and Method: Stylet Placement Confirmation: ETT inserted through vocal cords under direct vision,  positive ETCO2 and breath sounds checked- equal and bilateral Secured at: 21 cm Tube secured with: Tape Dental Injury: Teeth and Oropharynx as per pre-operative assessment

## 2016-08-06 NOTE — Progress Notes (Signed)
PT Cancellation Note  Patient Details Name: MARIELY MAHR MRN: [id number] DOB: [birth year]   Cancelled Treatment:    Reason Eval/Treat Not Completed: Other (comment) (Pt refused in am.  Will return in pm as able. )   [name] 08/06/2016, 10:49 AM [facility] [phone] (pager)

## 2016-08-06 NOTE — Progress Notes (Signed)
   08/06/16 1042  Clinical Encounter Type  Visited With Family  Visit Type Initial;Spiritual support;Social support;Pre-op  Referral From Nurse  Consult/Referral To Chaplain  Spiritual Encounters  Spiritual Needs Prayer  Stress Factors  Patient Stress Factors Health changes;Exhausted

## 2016-08-06 NOTE — Anesthesia Procedure Notes (Signed)
Arterial Line Insertion Start/End7/06/2016 3:30 PM, 08/06/2016 3:40 PM Performed by: Merrilyn Puma B, CRNA  Preanesthetic checklist: patient identified, IV checked, site marked, risks and benefits discussed, surgical consent, monitors and equipment checked, pre-op evaluation and timeout performed Lidocaine 1% used for infiltration Right, radial was placed Catheter size: 20 G Hand hygiene performed  and maximum sterile barriers used  Allen's test indicative of satisfactory collateral circulation Attempts: 1 Procedure performed without using ultrasound guided technique. Following insertion, Biopatch and dressing applied. Post procedure assessment: normal  Patient tolerated the procedure well with no immediate complications.

## 2016-08-06 NOTE — Progress Notes (Signed)
Chaplain stopped in to visit with patient via consult.  Patient is getting ready for surgery.  Chaplain spoke with husband of the patient who explains that this has been a long process and that she has been here over 40 days.  Husband feels that every time something is done to help her, she has a little  Bit of a set back.  Chaplain will return to speak with the patient after procedure, husband is requesting this to be on Friday afternoon.  Husband states that it sometimes takes her a while to come out of anesthesia and feel like talking.  Chaplain would like to thank staff and care team for caring for this patient and her family.  Please page Chaplain should as needed.

## 2016-08-06 NOTE — Anesthesia Preprocedure Evaluation (Addendum)
Anesthesia Evaluation  Patient identified by MRN, date of birth, ID band Patient awake    Reviewed: Allergy & Precautions, NPO status , Patient's Chart, lab work & pertinent test results  Airway Mallampati: IV  TM Distance: <3 FB Neck ROM: Full    Dental  (+) Teeth Intact, Dental Advisory Given, Loose,    Pulmonary neg pulmonary ROS, Current Smoker,    breath sounds clear to auscultation       Cardiovascular hypertension, + Peripheral Vascular Disease   Rhythm:Regular Rate:Tachycardia     Neuro/Psych Anxiety    GI/Hepatic Neg liver ROS, GERD  ,  Endo/Other  diabetes  Renal/GU negative Renal ROS     Musculoskeletal  (+) Arthritis ,   Abdominal (+) + obese,   Peds  Hematology negative hematology ROS (+)   Anesthesia Other Findings   Reproductive/Obstetrics negative OB ROS                            Lab Results  Component Value Date   WBC 18.2 (H) 08/06/2016   HGB 8.4 (L) 08/06/2016   HCT 27.1 (L) 08/06/2016   MCV 98.2 08/06/2016   PLT 214 08/06/2016   Lab Results  Component Value Date   CREATININE 0.97 08/06/2016   BUN 27 (H) 08/06/2016   NA 134 (L) 08/06/2016   K 4.4 08/06/2016   CL 98 (L) 08/06/2016   CO2 30 08/06/2016   Lab Results  Component Value Date   INR 1.08 07/29/2016   INR 1.72 07/19/2016   Echo: - Left ventricle: The cavity size was mildly reduced. There was   mild concentric hypertrophy. Systolic function was hyperdynamic.   The estimated ejection fraction was in the range of 70% to 80%.   Wall motion was normal; there were no regional wall motion   abnormalities. Doppler parameters are consistent with abnormal   left ventricular relaxation (grade 1 diastolic dysfunction). - Mitral valve: Calcified annulus. - Pericardium, extracardiac: A trivial pericardial effusion was   identified.  Anesthesia Physical Anesthesia Plan  ASA: IV  Anesthesia Plan:  General   Post-op Pain Management:    Induction: Intravenous  PONV Risk Score and Plan: 3 and Ondansetron, Dexamethasone, Propofol and Midazolam  Airway Management Planned: Oral ETT and Video Laryngoscope Planned  Additional Equipment: Arterial line  Intra-op Plan:   Post-operative Plan: Possible Post-op intubation/ventilation  Informed Consent: I have reviewed the patients History and Physical, chart, labs and discussed the procedure including the risks, benefits and alternatives for the proposed anesthesia with the patient or authorized representative who has indicated his/her understanding and acceptance.   Dental advisory given  Plan Discussed with: CRNA  Anesthesia Plan Comments:      Anesthesia Quick Evaluation

## 2016-08-06 NOTE — Progress Notes (Signed)
Subjective  -   anxious with abdominal pain    Physical Exam:  abd tender CV:RRR     UA negative WBC 18K   Assessment/Plan:    Discussed proceeding with TEVAR.  Risks unchanged from previous procedure.  Family aware and ant to proceed.  Annamarie Major 08/06/2016 3:56 PM --  Vitals:   08/06/16 1300 08/06/16 1400  BP: (!) 135/47 (!) 110/43  Pulse: 96 (!) 102  Resp: 18 (!) 21  Temp:      Intake/Output Summary (Last 24 hours) at 08/06/16 1556 Last data filed at 08/06/16 1400  Gross per 24 hour  Intake          2438.33 ml  Output              700 ml  Net          1738.33 ml     Laboratory CBC    Component Value Date/Time   WBC 18.2 (H) 08/06/2016 0433   HGB 8.4 (L) 08/06/2016 0433   HCT 27.1 (L) 08/06/2016 0433   PLT 214 08/06/2016 0433    BMET    Component Value Date/Time   NA 134 (L) 08/06/2016 0433   K 4.4 08/06/2016 0433   CL 98 (L) 08/06/2016 0433   CO2 30 08/06/2016 0433   GLUCOSE 94 08/06/2016 0433   BUN 27 (H) 08/06/2016 0433   CREATININE 0.97 08/06/2016 0433   CALCIUM 8.1 (L) 08/06/2016 0433   GFRNONAA >60 08/06/2016 0433   GFRAA >60 08/06/2016 0433    COAG Lab Results  Component Value Date   INR 1.08 07/29/2016   INR 1.72 07/19/2016   No results found for: PTT  Antibiotics Anti-infectives    Start     Dose/Rate Route Frequency Ordered Stop   08/06/16 1400  vancomycin (VANCOCIN) IVPB 1000 mg/200 mL premix     1,000 mg 200 mL/hr over 60 Minutes Intravenous To ShortStay Surgical 08/06/16 0038 08/07/16 1400   08/04/16 1100  [MAR Hold]  ceFEPIme (MAXIPIME) 1 g in dextrose 5 % 50 mL IVPB     (MAR Hold since 08/06/16 1447)   1 g 100 mL/hr over 30 Minutes Intravenous Every 12 hours 08/04/16 1049     07/27/16 1400  meropenem (MERREM) 1 g in sodium chloride 0.9 % 100 mL IVPB     1 g 200 mL/hr over 30 Minutes Intravenous Every 8 hours 07/27/16 0849 07/29/16 2143   07/25/16 2200  meropenem (MERREM) 1 g in sodium chloride 0.9 %  100 mL IVPB  Status:  Discontinued     1 g 200 mL/hr over 30 Minutes Intravenous Every 12 hours 07/25/16 1223 07/27/16 0849   07/23/16 1000  ceFEPIme (MAXIPIME) 1 g in dextrose 5 % 50 mL IVPB  Status:  Discontinued     1 g 100 mL/hr over 30 Minutes Intravenous Every 8 hours 07/23/16 0911 07/23/16 0917   07/23/16 1000  meropenem (MERREM) 1 g in sodium chloride 0.9 % 100 mL IVPB  Status:  Discontinued     1 g 200 mL/hr over 30 Minutes Intravenous Every 8 hours 07/23/16 0919 07/25/16 1223   07/18/16 0000  levofloxacin (LEVAQUIN) IVPB 500 mg  Status:  Discontinued     500 mg 100 mL/hr over 60 Minutes Intravenous Every 24 hours 07/17/16 0903 07/17/16 0938   07/17/16 1100  vancomycin (VANCOCIN) 1,500 mg in sodium chloride 0.9 % 500 mL IVPB     1,500 mg 250 mL/hr over 120  Minutes Intravenous Every 24 hours 07/17/16 0946 07/23/16 1221   07/17/16 1000  levofloxacin (LEVAQUIN) tablet 500 mg  Status:  Discontinued     500 mg Oral Daily 07/17/16 0851 07/17/16 0903   07/15/16 0900  ciprofloxacin (CIPRO) tablet 500 mg  Status:  Discontinued     500 mg Oral 2 times daily 07/15/16 0828 07/17/16 0851   07/14/16 1115  levofloxacin (LEVAQUIN) tablet 500 mg  Status:  Discontinued     500 mg Oral Daily 07/14/16 1107 07/14/16 1214   06/26/16 0830  meropenem (MERREM) 1 g in sodium chloride 0.9 % 100 mL IVPB  Status:  Discontinued     1 g 200 mL/hr over 30 Minutes Intravenous Every 8 hours 06/26/16 0810 07/06/16 0804   06/22/16 1800  clindamycin (CLEOCIN) IVPB 600 mg  Status:  Discontinued     600 mg 100 mL/hr over 30 Minutes Intravenous Every 8 hours 06/22/16 1723 06/26/16 0810   06/22/16 1000  azithromycin (ZITHROMAX) 250 mg in dextrose 5 % 125 mL IVPB  Status:  Discontinued     250 mg 125 mL/hr over 60 Minutes Intravenous Every 24 hours 06/22/16 0826 06/27/16 1019   06/22/16 0900  levofloxacin (LEVAQUIN) IVPB 500 mg  Status:  Discontinued     500 mg 100 mL/hr over 60 Minutes Intravenous Every 24 hours  06/22/16 0759 06/22/16 0824       V. Leia Alf, M.D. [facility] Office: [phone] Pager:  [phone]

## 2016-08-06 NOTE — Progress Notes (Addendum)
TCTS DAILY ICU PROGRESS NOTE                   [address] [place]            [place] [address]          [phone]   Day of Surgery Procedure(s) (LRB): THORACIC AORTIC ENDOVASCULAR STENT GRAFT (N/A)  Total Length of Stay:  LOS: 47 days   Subjective: Patient continue with complaints of abdominal pain (which she has had). She is in PACU as Dr. Trula Slade is going to attempt TEVAR this afternoon.  Objective: Vital signs in last 24 hours: Temp:  [97.6 F (36.4 C)-99.2 F (37.3 C)] 97.6 F (36.4 C) (07/05 1200) Pulse Rate:  [92-112] 102 (07/05 1400) Cardiac Rhythm: Normal sinus rhythm (07/05 0800) Resp:  [18-36] 21 (07/05 1400) BP: (106-135)/(43-65) 110/43 (07/05 1400) SpO2:  [91 %-99 %] 94 % (07/05 1400) Weight:  [81.7 kg (180 lb 1.9 oz)] 81.7 kg (180 lb 1.9 oz) (07/05 0400)  Filed Weights   08/04/16 0906 08/05/16 0500 08/06/16 0400  Weight: 82.1 kg (181 lb) 81.8 kg (180 lb 5.4 oz) 81.7 kg (180 lb 1.9 oz)      Intake/Output from previous day: 07/04 0701 - 07/05 0700 In: 3393.7 [P.O.:480; I.V.:2029; NG/GT:734.7; IV Piggyback:150] Out: 800 [Urine:800]  Intake/Output this shift: Total I/O In: 583.3 [I.V.:433.3; NG/GT:50; IV Piggyback:100] Out: 500 [Urine:500]  Current Meds: Scheduled Meds: . [MAR Hold] arformoterol  15 mcg Nebulization BID  . [MAR Hold] budesonide (PULMICORT) nebulizer solution  0.5 mg Nebulization BID  . [MAR Hold] Chlorhexidine Gluconate Cloth  6 each Topical Q0600  . [MAR Hold] Chlorhexidine Gluconate Cloth  6 each Topical Daily  . [MAR Hold] citalopram  40 mg Oral Daily  . [MAR Hold] clonazePAM  1 mg Oral QHS  . [MAR Hold] feeding supplement  1 Container Oral BID BM  . [MAR Hold] furosemide  40 mg Oral Daily  . [MAR Hold] insulin aspart  0-24 Units Subcutaneous Q6H  . [MAR Hold] magic mouthwash  5 mL Oral QID  . [MAR Hold] metoprolol tartrate  25 mg Oral BID  . [MAR Hold] pantoprazole sodium  40 mg Per Tube Daily  . [MAR Hold] sodium  chloride flush  10-40 mL Intracatheter Q12H  . [MAR Hold] sodium chloride flush  10-40 mL Intracatheter Q12H   Continuous Infusions: . Marland KitchenTPN (CLINIMIX-E) Adult    . [MAR Hold] sodium chloride    . sodium chloride 10 mL/hr at 08/06/16 1219  . [MAR Hold] ceFEPime (MAXIPIME) IV Stopped (08/06/16 1029)  . dextrose 40 mL/hr at 08/06/16 1219  . feeding supplement (VITAL 1.5 CAL) Stopped (08/06/16 0000)  . vancomycin     PRN Meds:.Place/Maintain arterial line **AND** [MAR Hold] sodium chloride, [MAR Hold] acetaminophen **OR** [MAR Hold] acetaminophen, [MAR Hold] ALPRAZolam, [MAR Hold] fentaNYL (SUBLIMAZE) injection, [MAR Hold] Gerhardt's butt cream, [MAR Hold] HYDROmorphone, [MAR Hold] labetalol, [MAR Hold] ondansetron (ZOFRAN) IV, [MAR Hold] phenol, [MAR Hold] pneumococcal 23 valent vaccine, [MAR Hold] simethicone, [MAR Hold] sodium chloride flush, [MAR Hold] sodium chloride flush  Heart: Slightly tachycardic Lungs: Clear to auscultation bilaterally Abdomen: Soft, distended, diffusely tender mostly across abdomen, rare bowel sounds   Lab Results: CBC:  Recent Labs  08/05/16 0416 08/06/16 0433  WBC 17.9* 18.2*  HGB 8.8* 8.4*  HCT 28.7* 27.1*  PLT 219 214   BMET:   Recent Labs  08/05/16 0416 08/06/16 0433  NA 130* 134*  K 3.3* 4.4  CL  92* 98*  CO2 31 30  GLUCOSE 126* 94  BUN 28* 27*  CREATININE 0.97 0.97  CALCIUM 7.6* 8.1*    CMET: Lab Results  Component Value Date   WBC 18.2 (H) 08/06/2016   HGB 8.4 (L) 08/06/2016   HCT 27.1 (L) 08/06/2016   PLT 214 08/06/2016   GLUCOSE 94 08/06/2016   TRIG 179 (H) 08/03/2016   ALT 12 (L) 08/06/2016   AST 18 08/06/2016   NA 134 (L) 08/06/2016   K 4.4 08/06/2016   CL 98 (L) 08/06/2016   CREATININE 0.97 08/06/2016   BUN 27 (H) 08/06/2016   CO2 30 08/06/2016   INR 1.08 07/29/2016      PT/INR:  No results for input(s): LABPROT, INR in the last 72 hours. Radiology: Dg Chest Port 1 View  Result Date: 08/06/2016 CLINICAL DATA:   History of aortic dissection. EXAM: PORTABLE CHEST 1 VIEW COMPARISON:  CT 08/05/2016.  Chest x-ray 08/02/2016. FINDINGS: Feeding tube tip below left hemidiaphragm. Right PICC line stable position. Stable cardiomegaly. Stable prominence of the thoracic aorta consistent with patient's known aortic dissection. Similar appearance noted on prior exams. Low lung volumes with persistent basilar atelectasis. Left base infiltrate and small left pleural effusion cannot be excluded. No change from prior exam. No pneumothorax . IMPRESSION: Feeding tube and right PICC line stable position. 2. Stable cardiomegaly. Stable prominence of the thoracic aorta consistent patient's known aortic dissection. Similar present on prior exams. 3. Low lung volumes with persistent bibasilar atelectasis. Left base infiltrate small left pleural effusion cannot be excluded . Similar findings noted on prior exam. Electronically Signed   By: [place]   On: 08/06/2016 07:00     Assessment/Plan: 1. CV-Slightly tachycardic this am with HR in the low 100's. SBP well controlled as 120's or less. Continue Lopressor 25 mg bid. 2. Pulmonary-On 3 liters of oxygen via [place]. Continue Pulmicort and Brovana nebs. 3. 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 She was on sips of clear liquids (not taking much) but made NPO last evening. Continue TPN.  4. Edema secondary to low albumin. On Lasix 40 mg daily. 5. DM-CBGs 112/105/113. On Insulin. 6. Anemia-H and H this am up to 8.4 and 27.1 . She is NOT on ecasa or Lovenox. 7. Intermittent fever and last WBC 18,200. She has been treated for a UTI as well as possible PNA-on Cefepime currently. She has had intestinal angina as well. 8. For TEVAR today  ZIMMERMAN,DONIELLE M PA-C 08/06/2016 2:52 PM   Agree with plan for TEVAR to cover site of descending thoracic aortic tear in order to potentially improve mesenteric perfusion. Blood cultures negative. Urine  cultures negative. White count remains moderately elevated 18,000 without fever.  Agree with above assessment. Her Lucianne Lei trigt M.D.

## 2016-08-06 NOTE — Transfer of Care (Signed)
Immediate Anesthesia Transfer of Care Note  Patient: KADI HESSION  Procedure(s) Performed: Procedure(s): THORACIC AORTIC ENDOVASCULAR STENT GRAFT/Thorasic and Abdominal Angiogram, Entravascular ultrasound., Open femoral exposure,Left Brachial access, and patch angioplasty right femoral artery. (N/A)  Patient Location: PACU  Anesthesia Type:General  Level of Consciousness: awake, drowsy, patient cooperative and responds to stimulation  Airway & Oxygen Therapy: Patient Spontanous Breathing and Patient connected to face mask oxygen  Post-op Assessment: Report given to RN and Post -op Vital signs reviewed and stable  Post vital signs: Reviewed and stable  Last Vitals:  Vitals:   08/06/16 1300 08/06/16 1400  BP: (!) 135/47 (!) 110/43  Pulse: 96 (!) 102  Resp: 18 (!) 21  Temp:      Last Pain:  Vitals:   08/06/16 1200  TempSrc: Oral  PainSc: Asleep      Patients Stated Pain Goal: 0 (37/94/44 6190)  Complications: No apparent anesthesia complications

## 2016-08-06 NOTE — Op Note (Signed)
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 Blood Loss:  See anesthesia record Specimens:  None  Findings:  We were able to gain chew lumen access from the left arm.  We were able to to advance a wire from the right groin however in the infrarenal aorta, this was in the false lumen as confirmed by intravascular ultrasound.  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  We ended up stenting the entry tear down to the level of the fenestration.  Devices used: Proximal is a GoreCTAG 34 x 10.  Distal extension is a Gore CTAG 37 x 10  Indications:  The patient initially presented with a type IIIB aortic dissection.  She did not have and organ damage and therefore she was managed nonoperatively.  Unfortunately, she has failed nonoperative management.  We tried to gain access into her femoral arteries a week ago but went into a dissection and therefore aborted the procedure.  The patient is brought back today for a second attempt using an open femoral approach.  The risks and benefits were discussed with the daughter and husband in extensive detail.  They understand and wish to  proceed.  Procedure:  The patient was identified in the holding area and taken to [facility]  The patient was then placed supine on the table. general anesthesia was administered.  The patient was prepped and draped in the usual sterile fashion.  A time out was called and antibiotics were administered.  The left brachial artery was cannulated under ultrasound guidance with a micropuncture needle.  An 018 wire was advanced without resistance from a micropuncture sheath.  Over an 035 wire, a 5 French sheath was placed in the left brachial artery.  The wire was able to be advanced to the subclavian artery into the ascending aorta.  The wire was then directed into the descending thoracic aorta.  A oblique incision was made in the right groin.  Cautery was used to divide subcutaneous tissue down to the femoral sheath which was opened sharply.  The common femoral artery was dissected out from the inguinal ligament down to the bifurcation.  This was a soft artery with a palpable pulse.  An 18-gauge needle was used to make an arteriotomy and a 035 wire was advanced without resistance followed by placement of an 8 [place] sheath.  At this point in time, the patient was fully heparinized.  I was able to easily advance a Benson wire into the ascending aorta.  Intravascular ultrasound was used to evaluate the external iliac, common iliac, aorta, and thoracic aorta.  This confirmed that the wire was in the false lumen within the infrarenal abdominal aorta.  A crossed over  at the entry tear just distal to the left subclavian.  We then used the location of the lower fenestration, as identified by vascular ultrasound to cross over from the left arm into the false lumen and down into the right groin.  A quick cross catheter was placed over the wire from the right groin and brought out through the left arm.  We then used the quick cross catheter and a versa core wire to prolapse the wire back into the ascending aorta.  A  Lunderquist double curve wire was placed.  This was from the right groin.  Vascular ultrasound was then used to confirm that we were in the true lumen down to approximately the level of the celiac artery.  A pigtail catheter was advanced from the left arm into the ascending aorta so that we can perform a thoracic aortic angiogram.  This located the level of the left subclavian and left carotid artery.  We felt that based on the location of the entry tear that at least significant partial coverage of the left subclavian artery was required.  Through a 35 French sheath in the right groin a Gore CTAG 34 x 10 device was selected and advanced up the level of the left subclavian artery.  After additional imaging, we decided on the location of the graft and it was deployed.  This did cover the left subclavian artery but the left carotid remained patent.  We felt that there was additional room and the dissection to be covered and yet to stay above where the wire crossed over from the false lumen to the true lumen.  A second device was prepared this was a 37 x 10 device.  I did change out for a 24 [place] sheath.  This device was inserted and placed with the appropriate overlap.  Additional imaging was performed to make sure that it was in the right location and then it was deployed.  A pigtail catheter was placed within the device and a thoracic abdominal aortogram was performed.  This revealed continued patency of the celiac, superior mesenteric, and right renal artery.  The infrarenal aorta and bilateral iliacs were also widely patent.  There was a palpable pulse within the left femoral artery and there was excellent pulsatile bleeding from the sheath in the right groin.  The patient also had brisk Doppler signals in both pedal vessels.  We hooked up the 5 French sheath in the arm to the pressure tubing and she had a systolic pressure of around 100 and a mean pressure of 75.  At this point the sheath in the right groin was  removed.  A significant posterior intima was removed and there was a large defect and a small artery which I felt needed to be repaired with patch angioplasty.  A bovine pericardial patch was used to close the right common femoral artery with running 6-0 Prolene.  Prior to completion, the appropriate flushing maneuvers were performed and the clamps were released.  There was excellent flow within the artery.  50 mg of protamine was given.  The 5 French sheath in the left arm was removed and manual pressure was held for 15 minutes until this was hemostatic.  In the right groin, once hemostasis was satisfactory, the femoral sheath was closed with 2-0 Vicryl the subcutaneous tissue was then closed with multiple layers of 3-0 Vicryl followed by application of a Provena wound VAC.   Disposition:  To PACU in stable   V. Annamarie Major, M.D.  [facility] Office: [phone] Pager:  [phone]

## 2016-08-07 ENCOUNTER — Encounter (HOSPITAL_COMMUNITY): Payer: Self-pay | Admitting: Surgery

## 2016-08-07 LAB — CBC
HEMATOCRIT: 30.2 % — AB (ref 36.0–46.0)
HEMATOCRIT: 31.1 % — AB (ref 36.0–46.0)
Hemoglobin: 9.7 g/dL — ABNORMAL LOW (ref 12.0–15.0)
Hemoglobin: 9.9 g/dL — ABNORMAL LOW (ref 12.0–15.0)
MCH: 30 pg (ref 26.0–34.0)
MCH: 30.6 pg (ref 26.0–34.0)
MCHC: 31.8 g/dL (ref 30.0–36.0)
MCHC: 32.1 g/dL (ref 30.0–36.0)
MCV: 93.5 fL (ref 78.0–100.0)
MCV: 96 fL (ref 78.0–100.0)
PLATELETS: 194 10*3/uL (ref 150–400)
Platelets: 177 10*3/uL (ref 150–400)
RBC: 3.23 MIL/uL — AB (ref 3.87–5.11)
RBC: 3.24 MIL/uL — AB (ref 3.87–5.11)
RDW: 16.7 % — ABNORMAL HIGH (ref 11.5–15.5)
RDW: 16.9 % — ABNORMAL HIGH (ref 11.5–15.5)
WBC: 17.4 10*3/uL — AB (ref 4.0–10.5)
WBC: 18.1 10*3/uL — AB (ref 4.0–10.5)

## 2016-08-07 LAB — BASIC METABOLIC PANEL
ANION GAP: 8 (ref 5–15)
ANION GAP: 8 (ref 5–15)
Anion gap: 8 (ref 5–15)
BUN: 30 mg/dL — ABNORMAL HIGH (ref 6–20)
BUN: 27 mg/dL — AB (ref 6–20)
BUN: 29 mg/dL — ABNORMAL HIGH (ref 6–20)
CHLORIDE: 97 mmol/L — AB (ref 101–111)
CHLORIDE: 95 mmol/L — AB (ref 101–111)
CO2: 26 mmol/L (ref 22–32)
CO2: 27 mmol/L (ref 22–32)
CO2: 29 mmol/L (ref 22–32)
Calcium: 7.4 mg/dL — ABNORMAL LOW (ref 8.9–10.3)
Calcium: 7.5 mg/dL — ABNORMAL LOW (ref 8.9–10.3)
Calcium: 7.7 mg/dL — ABNORMAL LOW (ref 8.9–10.3)
Chloride: 93 mmol/L — ABNORMAL LOW (ref 101–111)
Creatinine, Ser: 0.98 mg/dL (ref 0.44–1.00)
Creatinine, Ser: 1.09 mg/dL — ABNORMAL HIGH (ref 0.44–1.00)
Creatinine, Ser: 1.12 mg/dL — ABNORMAL HIGH (ref 0.44–1.00)
GFR calc Af Amer: >60 mL/min (ref >60)
GFR calc Af Amer: >60 mL/min (ref >60)
GFR calc Af Amer: >60 mL/min (ref >60)
GFR calc non Af Amer: 53 mL/min — ABNORMAL LOW (ref >60)
GFR calc non Af Amer: >60 mL/min (ref >60)
GFR, EST NON AFRICAN AMERICAN: 54 mL/min — AB (ref >60)
GLUCOSE: 261 mg/dL — AB (ref 65–99)
Glucose, Bld: 218 mg/dL — ABNORMAL HIGH (ref 65–99)
Glucose, Bld: 139 mg/dL — ABNORMAL HIGH (ref 65–99)
POTASSIUM: 4.5 mmol/L (ref 3.5–5.1)
POTASSIUM: 4.5 mmol/L (ref 3.5–5.1)
Potassium: 3 mmol/L — ABNORMAL LOW (ref 3.5–5.1)
Sodium: 130 mmol/L — ABNORMAL LOW (ref 135–145)
Sodium: 130 mmol/L — ABNORMAL LOW (ref 135–145)
Sodium: 131 mmol/L — ABNORMAL LOW (ref 135–145)

## 2016-08-07 LAB — MAGNESIUM
MAGNESIUM: 1.6 mg/dL — AB (ref 1.7–2.4)
MAGNESIUM: 1.5 mg/dL — AB (ref 1.7–2.4)
Magnesium: 2.2 mg/dL (ref 1.7–2.4)

## 2016-08-07 LAB — GLUCOSE, CAPILLARY
Glucose-Capillary: 125 mg/dL — ABNORMAL HIGH (ref 65–99)
Glucose-Capillary: 151 mg/dL — ABNORMAL HIGH (ref 65–99)
Glucose-Capillary: 156 mg/dL — ABNORMAL HIGH (ref 65–99)
Glucose-Capillary: 163 mg/dL — ABNORMAL HIGH (ref 65–99)
Glucose-Capillary: 189 mg/dL — ABNORMAL HIGH (ref 65–99)
Glucose-Capillary: 222 mg/dL — ABNORMAL HIGH (ref 65–99)
Glucose-Capillary: 265 mg/dL — ABNORMAL HIGH (ref 65–99)

## 2016-08-07 LAB — BLOOD PRODUCT ORDER (VERBAL) VERIFICATION

## 2016-08-07 LAB — PHOSPHORUS: PHOSPHORUS: 5.1 mg/dL — AB (ref 2.5–4.6)

## 2016-08-07 LAB — PROTIME-INR
INR: 1.24
Prothrombin Time: 15.6 seconds — ABNORMAL HIGH (ref 11.4–15.2)

## 2016-08-07 LAB — APTT: APTT: 31 s (ref 24–36)

## 2016-08-07 MED ORDER — M.V.I. ADULT IV INJ
INTRAVENOUS | Status: AC
  Administered 2016-08-07: 17:00:00 via INTRAVENOUS
  Filled 2016-08-07: qty 1680

## 2016-08-07 MED ORDER — VITAL 1.5 CAL PO LIQD
1000.0000 mL | ORAL | Status: DC
Start: 2016-08-07 — End: 2016-08-10
  Filled 2016-08-07 (×3): qty 1000

## 2016-08-07 MED ORDER — FUROSEMIDE 10 MG/ML IJ SOLN
40.0000 mg | Freq: Once | INTRAMUSCULAR | Status: AC
  Administered 2016-08-07: 40 mg via INTRAVENOUS
  Filled 2016-08-07: qty 4

## 2016-08-07 MED ORDER — INSULIN GLARGINE 100 UNIT/ML ~~LOC~~ SOLN
12.0000 [IU] | Freq: Two times a day (BID) | SUBCUTANEOUS | Status: DC
  Administered 2016-08-07 – 2016-08-10 (×6): 12 [IU] via SUBCUTANEOUS
  Filled 2016-08-07 (×6): qty 0.12

## 2016-08-07 MED ORDER — MAGNESIUM SULFATE 4 GM/100ML IV SOLN
4.0000 g | Freq: Once | INTRAVENOUS | Status: AC
  Administered 2016-08-07: 4 g via INTRAVENOUS
  Filled 2016-08-07: qty 100

## 2016-08-07 MED ORDER — FUROSEMIDE 10 MG/ML IJ SOLN
40.0000 mg | Freq: Every day | INTRAMUSCULAR | Status: DC
  Administered 2016-08-08 – 2016-08-11 (×4): 40 mg via INTRAVENOUS
  Filled 2016-08-07 (×4): qty 4

## 2016-08-07 MED ORDER — POTASSIUM CHLORIDE 10 MEQ/50ML IV SOLN
10.0000 meq | INTRAVENOUS | Status: AC
  Administered 2016-08-07 – 2016-08-08 (×3): 10 meq via INTRAVENOUS
  Filled 2016-08-07 (×3): qty 50

## 2016-08-07 NOTE — Progress Notes (Addendum)
PHARMACY - ADULT TOTAL PARENTERAL NUTRITION CONSULT NOTE   Pharmacy Consult:  TPN Indication:  Prolonged NPO status/high NG output   Patient Measurements: Height: 5\' 3"  (160 cm) Weight: 179 lb 0.2 oz (81.2 kg) IBW/kg (Calculated) : 52.4 TPN AdjBW (KG): 61.7 Body mass index is 31.71 kg/m. 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 Patient had a significant history of N/V for 2-4 weeks PTA but weight appears to have been maintained. Patient has had several NG tube replacements due to nausea and vomiting.  Pharmacy consulted to manage TPN since 06/23/16.    GI: On TPN for persistent ileus and mesenteric ischemia, LGIB. Bowels now moving. CDiff negative on 6/29. On clear liquids. Appetite is charted as fair but poor intake. Placed Cortrak tube and started on TFs 7/2. TF are currently off as Vascular took to OR 7/5 for thoracic aortic endovascular stent but may not with possible active infection. Albumin low at 1.5, prealbumin low at 10.4 but up from 7.4. PPI PO. LBM 7.6.  Endo: DM on glipizide/metformin PTA. CBGs uncontrolled this am secondary to PICC change requiring TPN d/c and D10 initiated at low rate of 40 ml/hr x6 hrs until next bag could be hung. TPN resumed with lower insulin of 50 units in bag due to lows patient was having 7/5 AM with discontinuation of tube feeds. Patient also given dexamethasone 8mg  at 1736 PM in OR. CBGs range 163-261. Insulin requirements in the past 24 hours:  20 units SSI + 50 units in TPN.  Lytes: low Na/CL but slowly improving. K at goal today, 4.5 (goal >/= 4 with ileus) and Mag down 1.6 despite replacement (goal >/= 2 for ileus). CoCa 9.7 (iCa 1.03), Phos is elevated at 5.1. CaPhos product <55 (49.5). Has required large amounts of K and Mg replacement each day. On Lasix 40mg  po daily. Note TPN was held for 6 hrs on 7/5 (D10 in place) due to PICC exchange.  Renal: SCr rising at 1.12,  CrCl ~43ml/min. UOP 0.5 cc/kg/hr. BUN 30. NS at 45ml/hr. +2L last 24hr.  Pulm: pleural effusion, 6L HFNC (increased requirements). Brovana, Pulmicort, PRN albuterol Cards: aortic dissection stable, unsuccessful TEVAR - BP controlled, tachy. Lopressor, IV Lasix, PRN labetolol Hepatobil: LFTs normalized, tbili wnl, TG down to 179. Neuro: Celexa, Klonopin, PRN Fentanyl/Dialudid/Xanax/APAP - pain score 0-5 ID: S/p 2 courses of Merrem for PNA, s/p 3d Cipro >> s/p 7d vanc for E.faecalis UTI. Possible PNA with consolidation in R-base on Cefepime day #3. Resumed Vanc 7/5. Afebrile, WBC down to 17.4.  Best Practices: SCDs, CHG, MC  TPN Access: PICC placed 06/21/16; Exchanged 7/5 >> TPN start date: 06/23/16  Nutritional Goals (per RD recommendation on 7/3): 2000-2200 kCal  110-120 gm protein  Current Nutrition:   TPN Clear liquid diet (tolerating but little intake) >>7/5 Full Liquids 7/6 >> Vital 1.5 at 28ml/hr (32g of protein and 740 kcal) >>7/5 Boost Breeze BID - not given   Plan:  Change Clinimix further to 5/15 at 70 ml/hr at 1800 PM - no lytes due to elevated Phos. Stop IV lipid emulsions Continue NS at 10 ml/hr TPN provides 84 g of protein and 1193 kCals per day meeting 100% of protein and 90% kCal needs.  Continue MVI in TPN Continue TE in TPN every other day, next 7/5 Increase to 70 units of regular insulin in TPN Continue TCTS SSI Q6h and adjust as needed  Monitor TPN labs F/U advancement of TFs, transfer to tertiary center or [facility] rec Watch renal function F/U Toleration of full liquids and ability to stop TPN.    Give Mg 4g IV x 1.  Recheck BMET and Mg at 2000 PM and replace K+ as needed.   Sloan Leiter, PharmD, BCPS Clinical Pharmacist Clinical phone 08/07/2016 until 3:30 PM- [phone] After hours, please call #[phone] 08/07/2016 7:20 AM

## 2016-08-07 NOTE — Progress Notes (Signed)
K+ 3.0. Replacing with 3 runs of K+ per protocol.

## 2016-08-07 NOTE — Progress Notes (Signed)
Subjective  - POD #1, s/p TEVAR  Abdomen feels better this morning.   Physical Exam:  Abdomen soft and mildly tender Palpable pedal pulses     Assessment/Plan:  POD #1  Status post placement  TEVAR with coverage of left subclavian artery  -Abdomen is soft and mildly tender but improved, per the patient.  We'll attempt to start a clear liquid diet today.  - We will encourage mobilization with physical therapy  -Continue empiric antibiotic coverage.  White blood cell count remains stable today.  She is afebrile  -I plan to repeat her CT scan in 4-6 weeks.     Annamarie Major 08/07/2016 9:27 AM --  Vitals:   08/07/16 0827 08/07/16 0900  BP:  (!) 131/53  Pulse:  96  Resp:  (!) 28  Temp: (!) 97.5 F (36.4 C)     Intake/Output Summary (Last 24 hours) at 08/07/16 0927 Last data filed at 08/07/16 0800  Gross per 24 hour  Intake          3292.33 ml  Output             1275 ml  Net          2017.33 ml     Laboratory CBC    Component Value Date/Time   WBC 17.4 (H) 08/07/2016 0413   HGB 9.7 (L) 08/07/2016 0413   HCT 30.2 (L) 08/07/2016 0413   PLT 177 08/07/2016 0413    BMET    Component Value Date/Time   NA 131 (L) 08/07/2016 0413   K 4.5 08/07/2016 0413   CL 97 (L) 08/07/2016 0413   CO2 26 08/07/2016 0413   GLUCOSE 218 (H) 08/07/2016 0413   BUN 30 (H) 08/07/2016 0413   CREATININE 1.12 (H) 08/07/2016 0413   CALCIUM 7.7 (L) 08/07/2016 0413   GFRNONAA 53 (L) 08/07/2016 0413   GFRAA >60 08/07/2016 0413    COAG Lab Results  Component Value Date   INR 1.24 08/06/2016   INR 1.08 07/29/2016   INR 1.72 07/19/2016   No results found for: PTT  Antibiotics Anti-infectives    Start     Dose/Rate Route Frequency Ordered Stop   08/06/16 1400  vancomycin (VANCOCIN) IVPB 1000 mg/200 mL premix     1,000 mg 200 mL/hr over 60 Minutes Intravenous To ShortStay Surgical 08/06/16 0038 08/06/16 1814   08/04/16 1100  ceFEPIme (MAXIPIME) 1 g in dextrose 5 % 50 mL  IVPB     1 g 100 mL/hr over 30 Minutes Intravenous Every 12 hours 08/04/16 1049     07/27/16 1400  meropenem (MERREM) 1 g in sodium chloride 0.9 % 100 mL IVPB     1 g 200 mL/hr over 30 Minutes Intravenous Every 8 hours 07/27/16 0849 07/29/16 2143   07/25/16 2200  meropenem (MERREM) 1 g in sodium chloride 0.9 % 100 mL IVPB  Status:  Discontinued     1 g 200 mL/hr over 30 Minutes Intravenous Every 12 hours 07/25/16 1223 07/27/16 0849   07/23/16 1000  ceFEPIme (MAXIPIME) 1 g in dextrose 5 % 50 mL IVPB  Status:  Discontinued     1 g 100 mL/hr over 30 Minutes Intravenous Every 8 hours 07/23/16 0911 07/23/16 0917   07/23/16 1000  meropenem (MERREM) 1 g in sodium chloride 0.9 % 100 mL IVPB  Status:  Discontinued     1 g 200 mL/hr over 30 Minutes Intravenous Every 8 hours 07/23/16 0919 07/25/16 1223   07/18/16  0000  levofloxacin (LEVAQUIN) IVPB 500 mg  Status:  Discontinued     500 mg 100 mL/hr over 60 Minutes Intravenous Every 24 hours 07/17/16 0903 07/17/16 0938   07/17/16 1100  vancomycin (VANCOCIN) 1,500 mg in sodium chloride 0.9 % 500 mL IVPB     1,500 mg 250 mL/hr over 120 Minutes Intravenous Every 24 hours 07/17/16 0946 07/23/16 1221   07/17/16 1000  levofloxacin (LEVAQUIN) tablet 500 mg  Status:  Discontinued     500 mg Oral Daily 07/17/16 0851 07/17/16 0903   07/15/16 0900  ciprofloxacin (CIPRO) tablet 500 mg  Status:  Discontinued     500 mg Oral 2 times daily 07/15/16 0828 07/17/16 0851   07/14/16 1115  levofloxacin (LEVAQUIN) tablet 500 mg  Status:  Discontinued     500 mg Oral Daily 07/14/16 1107 07/14/16 1214   06/26/16 0830  meropenem (MERREM) 1 g in sodium chloride 0.9 % 100 mL IVPB  Status:  Discontinued     1 g 200 mL/hr over 30 Minutes Intravenous Every 8 hours 06/26/16 0810 07/06/16 0804   06/22/16 1800  clindamycin (CLEOCIN) IVPB 600 mg  Status:  Discontinued     600 mg 100 mL/hr over 30 Minutes Intravenous Every 8 hours 06/22/16 1723 06/26/16 0810   06/22/16 1000   azithromycin (ZITHROMAX) 250 mg in dextrose 5 % 125 mL IVPB  Status:  Discontinued     250 mg 125 mL/hr over 60 Minutes Intravenous Every 24 hours 06/22/16 0826 06/27/16 1019   06/22/16 0900  levofloxacin (LEVAQUIN) IVPB 500 mg  Status:  Discontinued     500 mg 100 mL/hr over 60 Minutes Intravenous Every 24 hours 06/22/16 0759 06/22/16 0824       V. Leia Alf, M.D. [facility] Office: [phone] Pager:  [phone]

## 2016-08-07 NOTE — Progress Notes (Signed)
Physical Therapy Treatment Patient Details Name: TREMEKA HELBLING MRN: [id number] DOB: 29-Feb-1955 Today's Date: 08/07/2016    History of Present Illness Pt adm with type B aortic dissection. Treated non surgically with BP control and pain control. Pt developed pancreatitis and ileus with NG tube placed. TEVAR procedure 08/06/16. PMH - anxiety, arthritis    PT Comments    Pt admitted with above diagnosis. Pt currently with functional limitations due to balance and endurance deficits. Pt progressing and able to ambulate entire unit with RW with min guard assist.  Pt in better spirits today smiling at PT.    Pt will benefit from skilled PT to increase their independence and safety with mobility to allow discharge to the venue listed below.     Follow Up Recommendations  Home health PT;Supervision/Assistance - 24 hour     Equipment Recommendations  Rolling walker with 5" wheels    Recommendations for Other Services       Precautions / Restrictions Precautions Precautions: Fall Restrictions Weight Bearing Restrictions: No    Mobility  Bed Mobility Overal bed mobility: Needs Assistance Bed Mobility: Sit to Supine       Sit to supine: Min assist   General bed mobility comments: assisst for LEs in to bed but light assist.   Transfers Overall transfer level: Needs assistance Equipment used: Rolling walker (2 wheeled) Transfers: Sit to/from Stand Sit to Stand: Min guard         General transfer comment: cues for safe hand placement  Ambulation/Gait Ambulation/Gait assistance: Min guard Ambulation Distance (Feet): 370 Feet Assistive device: Rolling walker (2 wheeled) Gait Pattern/deviations: Step-through pattern;Decreased stride length;Drifts right/left Gait velocity: decr Gait velocity interpretation: Below normal speed for age/gender General Gait Details: Vitals WNL with  pt on 4L with sats >90%; cues for breathing and cadence; pt increased cadence this  session   Stairs            Wheelchair Mobility    Modified Rankin (Stroke Patients Only)       Balance Overall balance assessment: Needs assistance Sitting-balance support: No upper extremity supported;Feet supported Sitting balance-Leahy Scale: Good     Standing balance support: During functional activity;No upper extremity supported Standing balance-Leahy Scale: Fair Standing balance comment: able to static stand without UE support                            Cognition Arousal/Alertness: Awake/alert Behavior During Therapy: Anxious;Flat affect Overall Cognitive Status: Impaired/Different from baseline Area of Impairment: Following commands;Attention;Problem solving;Awareness                   Current Attention Level: Sustained   Following Commands: Follows multi-step commands consistently;Follows multi-step commands with increased time Safety/Judgement: Decreased awareness of safety;Decreased awareness of deficits Awareness: Intellectual Problem Solving: Decreased initiation;Requires verbal cues;Slow processing        Exercises Total Joint Exercises Long Arc Quad: AROM;Both;Seated;10 reps General Exercises - Lower Extremity Ankle Circles/Pumps: AROM;10 reps;Both;Supine    General Comments        Pertinent Vitals/Pain Pain Assessment: Faces Faces Pain Scale: Hurts little more Pain Location: abdomen Pain Descriptors / Indicators: Aching;Grimacing Pain Intervention(s): Limited activity within patient's tolerance;Monitored during session;Premedicated before session;Repositioned    Home Living                      Prior Function            PT Goals (current  goals can now be found in the care plan section) Progress towards PT goals: Progressing toward goals    Frequency    Min 3X/week      PT Plan Current plan remains appropriate    Co-evaluation              AM-PAC PT "6 Clicks" Daily Activity  Outcome  Measure  Difficulty turning over in bed (including adjusting bedclothes, sheets and blankets)?: A Lot Difficulty moving from lying on back to sitting on the side of the bed? : A Little Difficulty sitting down on and standing up from a chair with arms (e.g., wheelchair, bedside commode, etc,.)?: A Little Help needed moving to and from a bed to chair (including a wheelchair)?: A Little Help needed walking in hospital room?: A Little Help needed climbing 3-5 steps with a railing? : A Little 6 Click Score: 17    End of Session Equipment Utilized During Treatment: Gait belt;Oxygen Activity Tolerance: Patient tolerated treatment well Patient left: with call bell/phone within reach;with family/visitor present;in bed Nurse Communication: Mobility status PT Visit Diagnosis: Muscle weakness (generalized) (M62.81);Other abnormalities of gait and mobility (R26.89)     Time: 0277-4128 PT Time Calculation (min) (ACUTE ONLY): 23 min  Charges:  $Gait Training: 8-22 mins $Therapeutic Exercise: 8-22 mins                    G Codes:       Lancelot Alyea,PT Acute Rehabilitation [phone] (pager)    [name] 08/07/2016, 2:58 PM

## 2016-08-07 NOTE — Progress Notes (Addendum)
1 Day Post-Op Procedure(s) (LRB): THORACIC AORTIC ENDOVASCULAR STENT GRAFT/Thorasic and Abdominal Angiogram, Entravascular ultrasound., Open femoral exposure,Left Brachial access, and patch angioplasty right femoral artery. (N/A) Subjective: Doing well after TEVAR Nsr, BP stable Extremities warm. Good pulses Cultures negative  Objective: Vital signs in last 24 hours: Temp:  [97.2 F (36.2 C)-99.1 F (37.3 C)] 99.1 F (37.3 C) (07/06 1505) Pulse Rate:  [86-99] 99 (07/06 1600) Cardiac Rhythm: Normal sinus rhythm (07/06 0800) Resp:  [12-29] 21 (07/06 1600) BP: (113-154)/(47-83) 135/51 (07/06 1600) SpO2:  [87 %-98 %] 93 % (07/06 1600) Arterial Line BP: (151-176)/(46-60) 176/52 (07/06 0800) Weight:  [179 lb 0.2 oz (81.2 kg)] 179 lb 0.2 oz (81.2 kg) (07/06 0500)  Hemodynamic parameters for last 24 hours:    Intake/Output from previous day: 07/05 0701 - 07/06 0700 In: 3362.3 [I.V.:2422.3; Blood:670; NG/GT:170; IV Piggyback:100] Out: 9021 [Urine:1025; Blood:100] Intake/Output this shift: Total I/O In: 1540 [P.O.:200; I.V.:1140; NG/GT:50; IV Piggyback:150] Out: 1000 [Urine:1000]       Exam    General- alert and comfortable   Lungs- clear without rales, wheezes   Cor- regular rate and rhythm, no murmur , gallop   Abdomen- soft, non-tender   Extremities - warm, non-tender, minimal edema   Neuro- oriented, appropriate, no focal weakness   Lab Results:  Recent Labs  08/06/16 2335 08/07/16 0413  WBC 18.1* 17.4*  HGB 9.9* 9.7*  HCT 31.1* 30.2*  PLT 194 177   BMET:  Recent Labs  08/06/16 2335 08/07/16 0413  NA 130* 131*  K 4.5 4.5  CL 95* 97*  CO2 27 26  GLUCOSE 261* 218*  BUN 27* 30*  CREATININE 1.09* 1.12*  CALCIUM 7.5* 7.7*    PT/INR:  Recent Labs  08/06/16 2335  LABPROT 15.6*  INR 1.24   ABG    Component Value Date/Time   PHART 7.471 (H) 08/06/2016 1830   HCO3 28.3 (H) 08/06/2016 1830   TCO2 29 08/06/2016 1830   ACIDBASEDEF 3.0 (H) 06/28/2016  1615   O2SAT 100.0 08/06/2016 1830   CBG (last 3)   Recent Labs  08/07/16 0821 08/07/16 1210 08/07/16 1549  GLUCAP 163* 189* 156*    Assessment/Plan: S/P Procedure(s) (LRB): THORACIC AORTIC ENDOVASCULAR STENT GRAFT/Thorasic and Abdominal Angiogram, Entravascular ultrasound., Open femoral exposure,Left Brachial access, and patch angioplasty right femoral artery. (N/A) Mobilize DC foley and  neck line , cont feeding tube [ she is taking full liq diet ] and hope to wean TPN off as malperfusion pattern improves with TEVAR  LOS: 48 days    Haley Graham 08/07/2016

## 2016-08-07 NOTE — Progress Notes (Signed)
Nutrition Follow-up  DOCUMENTATION CODES:   Obesity unspecified  INTERVENTION:   Vital 1.5 @ 30 ml/hr (720 ml/day) Provides: 1080 kcal, 48 grams protein, and 523 ml free water  Current TPN: 1192 kcal and 84 grams protein  TPN and TF provide:  2272 kcal 132 grams protein  NUTRITION DIAGNOSIS:   Inadequate oral intake related to altered GI function as evidenced by NPO status. Ongoing.   GOAL:   Patient will meet greater than or equal to 90% of their needs Progressing.   MONITOR:   TF tolerance, Diet advancement, I & O's  ASSESSMENT:   Pt with acute aortic dissection originating at the proximal descending thoracic aorta. Pt admitted for pain control, blood pressure control, bed rest and IV hydration.   7/5 s/p TEVAR, TF off since 7/5 at midnight for surgery on 7/5 Spoke with Dr Prescott Gum, ok to re-start TF at 30 ml/hr  Medications reviewed and include: colace, magic mouthwash, 4g mag sulfate x 1 Labs reviewed: Na 131 (L), PO4 5.1 (H) CBG's: [phone] I/O's: +550 Weight stable  TPN:  Clinimix (NO electrolytes) 5/15 @ 70 ml/hr No ILE Provides: 1680 ml volume, 1192 kcal (60% of needs) and 84 grams (76% of needs)  Diet Order:  .TPN (CLINIMIX-E) Adult Diet full liquid Room service appropriate? Yes; Fluid consistency: Thin TPN (CLINIMIX) Adult without lytes  Skin:   (MASD perimeum)  Last BM:  7/6 small  Height:   Ht Readings from Last 1 Encounters:  07/25/16 5\' 3"  (1.6 m)    Weight:   Wt Readings from Last 1 Encounters:  08/07/16 179 lb 0.2 oz (81.2 kg)    Ideal Body Weight:  52.2 kg  BMI:  Body mass index is 31.71 kg/m.  Estimated Nutritional Needs:   Kcal:  2000-2200  Protein:  110-120 gm  Fluid:  per MD  EDUCATION NEEDS:   No education needs identified at this time  [place], [place], [place] Pager [phone] After Hours Pager

## 2016-08-07 NOTE — Op Note (Signed)
08/06/2016 Pre-operative Diagnosis: Descending Thoracic aortic dissection Post-operative diagnosis:  Same Surgeon:  Annamarie Major Co-Surgeon:  Servando Snare Procedure:   #1: Endovascular repair of descending thoracic aorta                         #2: Distal extension 1                         #3: Open right common femoral artery exposure                         #4: Bovine pericardial patch angioplasty, right common femoral artery                         #5: Ultrasound-guided access, left brachial artery                         #6: Thoracic aortic angiogram                         #7: Abdominal aortogram                         #8: Intravascular ultrasound (IVUS), right external iliac, common iliac, abdominal aorta, thoracic aorta                         #9: Application of a right groin Provena wound VAC Anesthesia:  Gen. Blood Loss:  See anesthesia record Specimens:  None  Findings:  We were able to gain true lumen access from the left arm.  We were able to to advance a wire from the right groin however in the infrarenal aorta, this was in the false lumen as confirmed by intravascular ultrasound.  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  We ended up stenting the entry tear down to the level of the fenestration.  Devices used: Proximal is a GoreCTAG 34 x 10.  Distal extension is a Gore CTAG 37 x 10  Indications:  The patient initially presented with a type IIIB aortic dissection.  She did not have and organ damage and therefore she was managed nonoperatively.  Unfortunately, she has failed nonoperative management.  We tried to gain access into her femoral arteries a week ago but went into a dissection and therefore aborted the procedure.  The patient is brought back today for a second attempt using an open femoral approach.  The risks and benefits were  discussed with the daughter and husband in extensive detail.  They understand and wish to proceed.  Procedure:  The patient was identified in the holding area and taken to [facility]  The patient was then placed supine on the table. General anesthesia was administered.  The patient was prepped and draped in the usual sterile fashion.  A time out was called and antibiotics were administered.  Two surgeons were required given the complexity of the aortic tear. I participated in cannulating the left brachial artery and crossing the supraceliac dissection rent. I assisted with deployment of the two CTAG grafts and in repairin the right common femoral artery.   Erlene Quan  C. Donzetta Matters, MD [facility] Office: [phone] Pager: [phone]

## 2016-08-08 LAB — URINALYSIS, ROUTINE W REFLEX MICROSCOPIC
Bilirubin Urine: NEGATIVE
GLUCOSE, UA: NEGATIVE mg/dL
Hgb urine dipstick: NEGATIVE
KETONES UR: NEGATIVE mg/dL
LEUKOCYTES UA: NEGATIVE
Nitrite: NEGATIVE
PH: 7 (ref 5.0–8.0)
Protein, ur: NEGATIVE mg/dL
SPECIFIC GRAVITY, URINE: 1.006 (ref 1.005–1.030)

## 2016-08-08 LAB — CULTURE, BLOOD (ROUTINE X 2)
Culture: NO GROWTH
Culture: NO GROWTH
Special Requests: ADEQUATE
Special Requests: ADEQUATE

## 2016-08-08 LAB — BASIC METABOLIC PANEL
Anion gap: 6 (ref 5–15)
BUN: 29 mg/dL — ABNORMAL HIGH (ref 6–20)
CO2: 28 mmol/L (ref 22–32)
Calcium: 7.5 mg/dL — ABNORMAL LOW (ref 8.9–10.3)
Chloride: 96 mmol/L — ABNORMAL LOW (ref 101–111)
Creatinine, Ser: 0.94 mg/dL (ref 0.44–1.00)
GFR calc Af Amer: >60 mL/min (ref >60)
GFR calc non Af Amer: >60 mL/min (ref >60)
Glucose, Bld: 93 mg/dL (ref 65–99)
Potassium: 3.4 mmol/L — ABNORMAL LOW (ref 3.5–5.1)
Sodium: 130 mmol/L — ABNORMAL LOW (ref 135–145)

## 2016-08-08 LAB — CBC
HCT: 30 % — ABNORMAL LOW (ref 36.0–46.0)
Hemoglobin: 9.8 g/dL — ABNORMAL LOW (ref 12.0–15.0)
MCH: 31.7 pg (ref 26.0–34.0)
MCHC: 32.7 g/dL (ref 30.0–36.0)
MCV: 97.1 fL (ref 78.0–100.0)
Platelets: 194 10*3/uL (ref 150–400)
RBC: 3.09 MIL/uL — ABNORMAL LOW (ref 3.87–5.11)
RDW: 16.7 % — ABNORMAL HIGH (ref 11.5–15.5)
WBC: 21.9 10*3/uL — ABNORMAL HIGH (ref 4.0–10.5)

## 2016-08-08 LAB — GLUCOSE, CAPILLARY
Glucose-Capillary: 101 mg/dL — ABNORMAL HIGH (ref 65–99)
Glucose-Capillary: 104 mg/dL — ABNORMAL HIGH (ref 65–99)
Glucose-Capillary: 126 mg/dL — ABNORMAL HIGH (ref 65–99)
Glucose-Capillary: 100 mg/dL — ABNORMAL HIGH (ref 65–99)
Glucose-Capillary: 114 mg/dL — ABNORMAL HIGH (ref 65–99)
Glucose-Capillary: 115 mg/dL — ABNORMAL HIGH (ref 65–99)

## 2016-08-08 LAB — PHOSPHORUS: Phosphorus: 3.2 mg/dL (ref 2.5–4.6)

## 2016-08-08 LAB — MAGNESIUM: Magnesium: 1.7 mg/dL (ref 1.7–2.4)

## 2016-08-08 MED ORDER — TRACE MINERALS CR-CU-MN-SE-ZN 10-1000-500-60 MCG/ML IV SOLN
INTRAVENOUS | Status: AC
  Administered 2016-08-08: 18:00:00 via INTRAVENOUS
  Filled 2016-08-08: qty 1680

## 2016-08-08 MED ORDER — ALPRAZOLAM 0.25 MG PO TABS
0.2500 mg | ORAL_TABLET | Freq: Two times a day (BID) | ORAL | Status: DC | PRN
  Administered 2016-08-08 – 2016-08-11 (×7): 0.25 mg via ORAL
  Filled 2016-08-08 (×7): qty 1

## 2016-08-08 MED ORDER — PANTOPRAZOLE SODIUM 40 MG PO PACK
40.0000 mg | PACK | Freq: Every day | ORAL | Status: DC
  Administered 2016-08-08: 40 mg via ORAL
  Filled 2016-08-08: qty 20

## 2016-08-08 MED ORDER — HYDROMORPHONE HCL 1 MG/ML IJ SOLN
1.0000 mg | Freq: Once | INTRAMUSCULAR | Status: AC
  Administered 2016-08-08: 1 mg via INTRAVENOUS
  Filled 2016-08-08: qty 1

## 2016-08-08 MED ORDER — HYDROMORPHONE HCL 2 MG PO TABS
1.0000 mg | ORAL_TABLET | Freq: Three times a day (TID) | ORAL | Status: DC | PRN
  Administered 2016-08-08: 1 mg via ORAL
  Filled 2016-08-08: qty 1

## 2016-08-08 MED ORDER — MAGNESIUM SULFATE 4 GM/100ML IV SOLN
4.0000 g | Freq: Once | INTRAVENOUS | Status: AC
  Administered 2016-08-08: 4 g via INTRAVENOUS
  Filled 2016-08-08: qty 100

## 2016-08-08 MED ORDER — POTASSIUM CHLORIDE 10 MEQ/50ML IV SOLN: 10.0000 meq | INTRAVENOUS | Status: DC

## 2016-08-08 MED ORDER — POTASSIUM CHLORIDE 10 MEQ/50ML IV SOLN
10.0000 meq | INTRAVENOUS | Status: AC
Start: 2016-08-08 — End: 2016-08-08
  Administered 2016-08-08 (×2): 10 meq via INTRAVENOUS
  Filled 2016-08-08 (×2): qty 50

## 2016-08-08 MED ORDER — HYDROMORPHONE HCL 1 MG/ML IJ SOLN
1.0000 mg | INTRAMUSCULAR | Status: DC | PRN
  Administered 2016-08-09 – 2016-08-14 (×29): 1 mg via INTRAVENOUS
  Filled 2016-08-08 (×29): qty 1

## 2016-08-08 MED ORDER — HYDROMORPHONE HCL 2 MG PO TABS
1.0000 mg | ORAL_TABLET | Freq: Four times a day (QID) | ORAL | Status: DC | PRN
  Administered 2016-08-08: 1 mg via ORAL
  Filled 2016-08-08: qty 1

## 2016-08-08 NOTE — Progress Notes (Signed)
PHARMACY - ADULT TOTAL PARENTERAL NUTRITION CONSULT NOTE   Pharmacy Consult:  TPN Indication:  Prolonged NPO status/high NG output   Patient Measurements: Height: 5\' 3"  (160 cm) Weight: 176 lb 2.4 oz (79.9 kg) IBW/kg (Calculated) : 52.4 TPN AdjBW (KG): 61.7 Body mass index is 31.2 kg/m. 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 Patient had a significant history of N/V for 2-4 weeks PTA but weight appears to have been maintained. Patient has had several NG tube replacements due to nausea and vomiting.  Pharmacy consulted to manage TPN since 06/23/16.    GI: On TPN for persistent ileus and mesenteric ischemia, LGIB. CDiff negative on 6/29. Vascular took to OR 7/5 for thoracic aortic endovascular stent. Albumin low at 1.5, prealbumin incr at 10.4 (on 7/2). PPI PO. LBM 7/7.  Endo: DM on glipizide/metformin PTA. CBGs 93-151 w/ new TPN bag. Insulin requirements in the past 24 hours:  8 units SSI + 70 units in TPN + 12 units BID Lantus (added by MD on 7/6).  Lytes: low Na/CL but slowly improving. K low at 3.4 (goal >/= 4 with ileus) and Mag down to 1.7 despite replacement (goal >/= 2 for ileus). CoCa 9.5, Phos improved to 3.2 after removing lytes from TPN on 7/6. CaPhos product <55. Has required large amounts of K and Mg replacement each day. On Lasix 40mg  IV daily. Note TPN was held for 6 hrs on 7/5 (D10 in place) due to PICC exchange.  Renal: SCr down at 0.94, CrCl ~60-49ml/min. UOP 1.6 cc/kg/hr after Lasix 40mg  IV x1. BUN 30. NS at 32ml/hr. Even last 24hr. Net +9L Pulm: pleural effusion, 6L HFNC (increased requirements). Brovana, Pulmicort, PRN albuterol Cards: aortic dissection stable, unsuccessful TEVAR - BP elevated, ST. Lopressor, IV Lasix, PRN labetolol Hepatobil: LFTs normalized, tbili wnl, TG down to 179. Neuro: Celexa, Klonopin, PRN Fentanyl/Dialudid/Xanax/APAP - pain score 0-5 ID: S/p 2 courses of Merrem  for PNA, s/p 3d Cipro >> s/p 7d vanc for E.faecalis UTI. Possible PNA with consolidation in R-base on Cefepime day #4. Resumed Vanc 7/5- d#3. Afebrile (some low temps), WBC up to 21.9. Concern for UTI - culture pending.   Best Practices: SCDs, CHG, MC  TPN Access: PICC placed 06/21/16; Exchanged 7/5 >> TPN start date: 06/23/16  Nutritional Goals (per RD recommendation on 7/3): 2000-2200 kCal  110-120 gm protein  Current Nutrition:    TPN Full Liquids (30-50% intake charted) Boost Breeze BID - not given   Plan:  Continue Clinimix E 5/15 at 70 ml/hr. Stop IV lipid emulsions Continue NS at 10 ml/hr TPN provides 84 g of protein and 1193 kCals per day meeting 100% of protein and 90% kCal needs.  Continue MVI in TPN Continue TE in TPN every other day, next 7/5 Decrease to 60 units of regular insulin in TPN Continue Lantus 12 units BID per MD. Continue TCTS SSI Q6h and adjust as needed Monitor TPN labs F/U advancement of TFs, transfer to tertiary center or [facility] rec Watch renal function F/U Toleration of full liquids and ability to stop TPN.    Give KCl 73mEq x2 runs.  Give Magnesium 4g IV today. Recheck BMET at 1800PM.   Sloan Leiter, PharmD, BCPS Clinical Pharmacist Clinical phone 08/08/2016 until 3:30 PM- [phone] After hours, please call [phone] 08/08/2016 8:34 AM

## 2016-08-08 NOTE — Plan of Care (Signed)
Problem: Health Behavior/Discharge Planning: Goal: Ability to manage health-related needs will improve Outcome: Not Progressing Patient is very discouraged and not wanting to participate in treatment plan so much

## 2016-08-08 NOTE — Progress Notes (Addendum)
      [address]       [place] [address]             [phone]      C/o abdominal pain after getting back from her walk  Anxious  BP (!) 156/58   Pulse 100   Temp 98.7 F (37.1 C) (Oral)   Resp (!) 21   Ht 5\' 3"  (1.6 m)   Wt 176 lb 2.4 oz (79.9 kg)   SpO2 98%   BMI 31.20 kg/m    Intake/Output Summary (Last 24 hours) at 08/08/16 1913 Last data filed at 08/08/16 1800  Gross per 24 hour  Intake             2520 ml  Output             4035 ml  Net            -1515 ml   Still on TNA No relief with PO dilaudid, will change to IV Abdomen soft with only vague discomfort with palpation  Remo Lipps C. [name], MD [facility] [phone]

## 2016-08-08 NOTE — Progress Notes (Signed)
[facility]  Subjective  - burning like a UTI   Objective (!) 150/63 (!) 105 98.3 F (36.8 C) (Oral) 17 91%  Intake/Output Summary (Last 24 hours) at 08/08/16 1107 Last data filed at 08/08/16 0900  Gross per 24 hour  Intake          2299.67 ml  Output             3185 ml  Net          -885.33 ml   Abdomen slightly distended not really tender 2+ DP pulses VAC right groin  Assessment/Planning: D/c foley Check UA and culture ? Source of leukocytosis Change oral dilaudid to q6h PRN rather than once daily Renal function and extremity perfusion are good Still waiting to get full return of gut function   Haley Graham 08/08/2016 11:07 AM --  Laboratory Lab Results:  Recent Labs  08/07/16 0413 08/08/16 0405  WBC 17.4* 21.9*  HGB 9.7* 9.8*  HCT 30.2* 30.0*  PLT 177 194   BMET  Recent Labs  08/07/16 2108 08/08/16 0405  NA 130* 130*  K 3.0* 3.4*  CL 93* 96*  CO2 29 28  GLUCOSE 139* 93  BUN 29* 29*  CREATININE 0.98 0.94  CALCIUM 7.4* 7.5*    COAG Lab Results  Component Value Date   INR 1.24 08/06/2016   INR 1.08 07/29/2016   INR 1.72 07/19/2016   No results found for: PTT

## 2016-08-09 LAB — BASIC METABOLIC PANEL
Anion gap: 6 (ref 5–15)
BUN: 26 mg/dL — ABNORMAL HIGH (ref 6–20)
CO2: 25 mmol/L (ref 22–32)
Calcium: 7.1 mg/dL — ABNORMAL LOW (ref 8.9–10.3)
Chloride: 100 mmol/L — ABNORMAL LOW (ref 101–111)
Creatinine, Ser: 0.77 mg/dL (ref 0.44–1.00)
GFR calc Af Amer: >60 mL/min (ref >60)
GFR calc non Af Amer: >60 mL/min (ref >60)
Glucose, Bld: 88 mg/dL (ref 65–99)
Potassium: 3.2 mmol/L — ABNORMAL LOW (ref 3.5–5.1)
Sodium: 131 mmol/L — ABNORMAL LOW (ref 135–145)

## 2016-08-09 LAB — URINE CULTURE: Culture: NO GROWTH

## 2016-08-09 LAB — MAGNESIUM: Magnesium: 1.5 mg/dL — ABNORMAL LOW (ref 1.7–2.4)

## 2016-08-09 LAB — CATH TIP CULTURE: Culture: NO GROWTH

## 2016-08-09 LAB — PHOSPHORUS: Phosphorus: 3.5 mg/dL (ref 2.5–4.6)

## 2016-08-09 LAB — GLUCOSE, CAPILLARY
Glucose-Capillary: 111 mg/dL — ABNORMAL HIGH (ref 65–99)
Glucose-Capillary: 129 mg/dL — ABNORMAL HIGH (ref 65–99)
Glucose-Capillary: 158 mg/dL — ABNORMAL HIGH (ref 65–99)
Glucose-Capillary: 84 mg/dL (ref 65–99)
Glucose-Capillary: 93 mg/dL (ref 65–99)

## 2016-08-09 MED ORDER — MAGNESIUM SULFATE 4 GM/100ML IV SOLN
4.0000 g | Freq: Once | INTRAVENOUS | Status: AC
  Administered 2016-08-09: 4 g via INTRAVENOUS
  Filled 2016-08-09: qty 100

## 2016-08-09 MED ORDER — PANTOPRAZOLE SODIUM 40 MG PO TBEC
40.0000 mg | DELAYED_RELEASE_TABLET | Freq: Every day | ORAL | Status: DC
  Administered 2016-08-09 – 2016-08-22 (×14): 40 mg via ORAL
  Filled 2016-08-09 (×14): qty 1

## 2016-08-09 MED ORDER — POTASSIUM CHLORIDE 10 MEQ/50ML IV SOLN
10.0000 meq | INTRAVENOUS | Status: AC
  Administered 2016-08-09 (×3): 10 meq via INTRAVENOUS
  Filled 2016-08-09 (×3): qty 50

## 2016-08-09 MED ORDER — POTASSIUM CHLORIDE 10 MEQ/50ML IV SOLN
10.0000 meq | INTRAVENOUS | Status: AC
  Administered 2016-08-09 (×2): 10 meq via INTRAVENOUS
  Filled 2016-08-09 (×2): qty 50

## 2016-08-09 MED ORDER — CLINIMIX E/DEXTROSE (5/15) 5 % IV SOLN
INTRAVENOUS | Status: AC
  Administered 2016-08-09: 18:00:00 via INTRAVENOUS
  Filled 2016-08-09: qty 1680

## 2016-08-09 NOTE — Progress Notes (Signed)
[facility]  Subjective  - abdomen definitely better today   Objective 137/64 96 97.8 F (36.6 C) (Axillary) 15 95%  Intake/Output Summary (Last 24 hours) at 08/09/16 1038 Last data filed at 08/09/16 0800  Gross per 24 hour  Intake             2170 ml  Output             1600 ml  Net              570 ml   Abdomen soft non tender provena right groin 2+ dp pulses  Assessment/Planning: Ate a few things yesterday without pain Possibly advance diet tomorrow to regular food then can talk about getting rid of nutrition support Still with leukocytosis but afebrile trend for now.    Ruta Hinds 08/09/2016 10:38 AM --  Laboratory Lab Results:  Recent Labs  08/07/16 0413 08/08/16 0405  WBC 17.4* 21.9*  HGB 9.7* 9.8*  HCT 30.2* 30.0*  PLT 177 194   BMET  Recent Labs  08/08/16 0405 08/09/16 0439  NA 130* 131*  K 3.4* 3.2*  CL 96* 100*  CO2 28 25  GLUCOSE 93 88  BUN 29* 26*  CREATININE 0.94 0.77  CALCIUM 7.5* 7.1*    COAG Lab Results  Component Value Date   INR 1.24 08/06/2016   INR 1.08 07/29/2016   INR 1.72 07/19/2016   No results found for: PTT

## 2016-08-09 NOTE — Progress Notes (Signed)
      [address]       [place] [address]             [phone]      Ambulated x 3 today Still has some abdominal pain BP (!) 133/50   Pulse 90   Temp 97.8 F (36.6 C) (Oral)   Resp 18   Ht 5\' 3"  (1.6 m)   Wt 174 lb 13.2 oz (79.3 kg)   SpO2 97%   BMI 30.97 kg/m   Intake/Output Summary (Last 24 hours) at 08/09/16 1906 Last data filed at 08/09/16 1800  Gross per 24 hour  Intake          2919.66 ml  Output             2300 ml  Net           619.66 ml   2 liquid BM today- first may have had a slight amount of blood, second did not  Remo Lipps C. [name], MD [facility] [phone]

## 2016-08-09 NOTE — Progress Notes (Signed)
3 Days Post-Op Procedure(s) (LRB): THORACIC AORTIC ENDOVASCULAR STENT GRAFT/Thorasic and Abdominal Angiogram, Entravascular ultrasound., Open femoral exposure,Left Brachial access, and patch angioplasty right femoral artery. (N/A) Subjective: Feels better this morning, still has some abdominal pain but not as bad Ate a little ice cream this AM but afraid to try to eat more  Objective: Vital signs in last 24 hours: Temp:  [97.7 F (36.5 C)-98.8 F (37.1 C)] 97.8 F (36.6 C) (07/08 0805) Pulse Rate:  [90-107] 96 (07/08 0800) Cardiac Rhythm: Normal sinus rhythm (07/08 0900) Resp:  [15-31] 15 (07/08 0800) BP: (123-163)/(49-73) 137/64 (07/08 0800) SpO2:  [91 %-99 %] 95 % (07/08 0805) Weight:  [174 lb 13.2 oz (79.3 kg)] 174 lb 13.2 oz (79.3 kg) (07/08 0600)  Hemodynamic parameters for last 24 hours:    Intake/Output from previous day: 07/07 0701 - 07/08 0700 In: 2540 [P.O.:660; I.V.:1680; IV Piggyback:200] Out: 2900 [Urine:2900] Intake/Output this shift: Total I/O In: 70 [I.V.:70] Out: -   General appearance: alert, cooperative and anxious Neurologic: intact Heart: regular rate and rhythm Lungs: diminished breath sounds bibasilar Abdomen: mildly distended, mild vague tenderness  Lab Results:  Recent Labs  08/07/16 0413 08/08/16 0405  WBC 17.4* 21.9*  HGB 9.7* 9.8*  HCT 30.2* 30.0*  PLT 177 194   BMET:  Recent Labs  08/08/16 0405 08/09/16 0439  NA 130* 131*  K 3.4* 3.2*  CL 96* 100*  CO2 28 25  GLUCOSE 93 88  BUN 29* 26*  CREATININE 0.94 0.77  CALCIUM 7.5* 7.1*    PT/INR:  Recent Labs  08/06/16 2335  LABPROT 15.6*  INR 1.24   ABG    Component Value Date/Time   PHART 7.471 (H) 08/06/2016 1830   HCO3 28.3 (H) 08/06/2016 1830   TCO2 29 08/06/2016 1830   ACIDBASEDEF 3.0 (H) 06/28/2016 1615   O2SAT 100.0 08/06/2016 1830   CBG (last 3)   Recent Labs  08/08/16 2311 08/09/16 0319 08/09/16 0629  GLUCAP 115* 84 93    Assessment/Plan: S/P  Procedure(s) (LRB): THORACIC AORTIC ENDOVASCULAR STENT GRAFT/Thorasic and Abdominal Angiogram, Entravascular ultrasound., Open femoral exposure,Left Brachial access, and patch angioplasty right femoral artery. (N/A) -  CV- stable  RESP- IS, mobilize  RENAL- hypokalemia- supplement  ENDO- CBG well controlled  GI- chronic intestinal ischemia- her symptoms are better this morning but not resolved  Poor PO intake - will continue TNA for now  Deconditioning- ambulated this morning   LOS: 50 days    Melrose Nakayama 08/09/2016

## 2016-08-09 NOTE — Progress Notes (Signed)
PHARMACY - ADULT TOTAL PARENTERAL NUTRITION CONSULT NOTE   Pharmacy Consult:  TPN Indication:  Prolonged NPO status/high NG output   Patient Measurements: Height: 5\' 3"  (160 cm) Weight: 174 lb 13.2 oz (79.3 kg) IBW/kg (Calculated) : 52.4 TPN AdjBW (KG): 61.7 Body mass index is 30.97 kg/m. 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 Patient had a significant history of N/V for 2-4 weeks PTA but weight appears to have been maintained. Patient has had several NG tube replacements due to nausea and vomiting.  Pharmacy consulted to manage TPN since 06/23/16.    GI: On TPN for persistent ileus and mesenteric ischemia, LGIB. Vascular took to OR 7/5 for thoracic aortic endovascular stent. Albumin low at 1.5, prealbumin incr at 10.4 (on 7/2). PPI PO. LBM 7/8.  Endo: DM on glipizide/metformin PTA. CBGs 84-115 w/ new TPN bag. Insulin requirements in the past 24 hours:  0 units SSI + 60 units in TPN + 12 units BID Lantus (added by MD on 7/6).  Lytes: low Na/CL but slowly improving. K low at 3.2 with 5 runs ordered this AM per TCTS (goal >/= 4 with ileus) and Mag down to 1.5 despite replacement yesterday (goal >/= 2 for ileus). Has required large amounts of K and Mg replacement each day due to Lasix 40mg  IV daily. CoCa 9.1, Phos wnl.  Lytes removed from TPN d/t high Phos: 7/6.  Renal: SCr down at 0.77, CrCl ~60ml/min. UOP 1.5 cc/kg/hr on Lasix 40mg  IV daily. BUN 26. NS at 54ml/hr. -397ml last 24hr. Net +8L Pulm: pleural effusion, 1L . Brovana, Pulmicort, PRN albuterol Cards: aortic dissection stable, unsuccessful TEVAR - BP elevated, ST. Lopressor, IV Lasix, PRN labetolol Hepatobil: LFTs normalized, tbili wnl, TG down to 179. Neuro: Celexa, Klonopin, PRN Fentanyl/Dialudid/Xanax/APAP - Changed to IV Dilaudid 7/7.  ID: S/p 2 courses of Merrem for PNA, s/p 3d Cipro >> s/p 7d vanc for E.faecalis UTI. Possible PNA with  consolidation in R-base on Cefepime day #4. Resumed Vanc 7/5- d#3. Afebrile (some low temps), WBC up to 21.9. Concern for UTI - culture pending. Cdiff negative.   Best Practices: SCDs, CHG, MC, PPI po TPN Access: PICC placed 06/21/16; Exchanged 7/5 >> TPN start date: 06/23/16  Nutritional Goals (per RD recommendation on 7/6): 2000-2200 kCal  110-120 gm protein  Current Nutrition:    TPN Full Liquids (25-50% intake charted; fair appetite recorded) Boost Breeze BID (received both yesterday and 1 this AM; 1 can = 250 kcal, 9g protein, 0g fat)  Plan:  Continue Clinimix E 5/15 at 70 ml/hr per discussion with Dr. [name] on 7/8- no further taper at this time but also no increase with current volume status.  No IV lipid emulsions today--consider giving lipids on Tuesday (one week from prior administration) if still not eating well orally.  Continue NS at 10 ml/hr TPN + 2 cans Boost Breeze/day provides total of 102g of protein and 1693 kCals per day meeting  of 93 % protein and 85 % kCal needs.  Continue MVI in TPN Continue TE in TPN every other day, next 7/9 Decrease to 40 units of regular insulin in TPN Continue Lantus 12 units BID per MD. Continue TCTS SSI Q6h and adjust as needed Monitor TPN labs F/U advancement of TFs, transfer to tertiary center or [facility] rec Watch renal function F/U Toleration of full liquids and ability to stop TPN.    Give  Magnesium 4g IV today.  Sloan Leiter, PharmD, BCPS Clinical Pharmacist Clinical phone 08/09/2016 until 3:30 PM- [phone] After hours, please call #[phone] 08/09/2016 9:03 AM

## 2016-08-10 ENCOUNTER — Inpatient Hospital Stay (HOSPITAL_COMMUNITY): Payer: BLUE CROSS/BLUE SHIELD

## 2016-08-10 LAB — TYPE AND SCREEN
ABO/RH(D): O POS
Antibody Screen: NEGATIVE
Unit division: 0
Unit division: 0
Unit division: 0
Unit division: 0

## 2016-08-10 LAB — CBC
HEMATOCRIT: 28.7 % — AB (ref 36.0–46.0)
Hemoglobin: 9.2 g/dL — ABNORMAL LOW (ref 12.0–15.0)
MCH: 30.8 pg (ref 26.0–34.0)
MCHC: 32.1 g/dL (ref 30.0–36.0)
MCV: 96 fL (ref 78.0–100.0)
PLATELETS: 164 10*3/uL (ref 150–400)
RBC: 2.99 MIL/uL — ABNORMAL LOW (ref 3.87–5.11)
RDW: 16.6 % — AB (ref 11.5–15.5)
WBC: 17.1 10*3/uL — ABNORMAL HIGH (ref 4.0–10.5)

## 2016-08-10 LAB — BPAM RBC
Blood Product Expiration Date: 201807292359
Blood Product Expiration Date: 201807292359
Blood Product Expiration Date: 201807292359
Blood Product Expiration Date: 201807292359
ISSUE DATE / TIME: 201807051631
ISSUE DATE / TIME: 201807051631
Unit Type and Rh: 5100
Unit Type and Rh: 5100
Unit Type and Rh: 5100
Unit Type and Rh: 5100

## 2016-08-10 LAB — COMPREHENSIVE METABOLIC PANEL
ALT: 12 U/L — AB (ref 14–54)
AST: 14 U/L — AB (ref 15–41)
Albumin: 1.5 g/dL — ABNORMAL LOW (ref 3.5–5.0)
Alkaline Phosphatase: 98 U/L (ref 38–126)
Anion gap: 5 (ref 5–15)
BILIRUBIN TOTAL: 0.9 mg/dL (ref 0.3–1.2)
BUN: 26 mg/dL — AB (ref 6–20)
CALCIUM: 7.7 mg/dL — AB (ref 8.9–10.3)
CO2: 25 mmol/L (ref 22–32)
CREATININE: 0.87 mg/dL (ref 0.44–1.00)
Chloride: 98 mmol/L — ABNORMAL LOW (ref 101–111)
GFR calc Af Amer: >60 mL/min (ref >60)
Glucose, Bld: 96 mg/dL (ref 65–99)
Potassium: 3.5 mmol/L (ref 3.5–5.1)
Sodium: 128 mmol/L — ABNORMAL LOW (ref 135–145)
TOTAL PROTEIN: 5.4 g/dL — AB (ref 6.5–8.1)

## 2016-08-10 LAB — DIFFERENTIAL
BASOS ABS: 0 10*3/uL (ref 0.0–0.1)
Basophils Relative: 0 %
EOS PCT: 1 %
Eosinophils Absolute: 0.2 10*3/uL (ref 0.0–0.7)
LYMPHS ABS: 2.9 10*3/uL (ref 0.7–4.0)
Lymphocytes Relative: 17 %
MONO ABS: 0.9 10*3/uL (ref 0.1–1.0)
MONOS PCT: 5 %
Neutro Abs: 13.1 10*3/uL — ABNORMAL HIGH (ref 1.7–7.7)
Neutrophils Relative %: 77 %

## 2016-08-10 LAB — MAGNESIUM: Magnesium: 2 mg/dL (ref 1.7–2.4)

## 2016-08-10 LAB — GLUCOSE, CAPILLARY
Glucose-Capillary: 91 mg/dL (ref 65–99)
Glucose-Capillary: 157 mg/dL — ABNORMAL HIGH (ref 65–99)
Glucose-Capillary: 143 mg/dL — ABNORMAL HIGH (ref 65–99)
Glucose-Capillary: 124 mg/dL — ABNORMAL HIGH (ref 65–99)

## 2016-08-10 LAB — TRIGLYCERIDES: Triglycerides: 159 mg/dL — ABNORMAL HIGH (ref <150)

## 2016-08-10 LAB — PHOSPHORUS: PHOSPHORUS: 4.4 mg/dL (ref 2.5–4.6)

## 2016-08-10 LAB — PREALBUMIN: Prealbumin: 13 mg/dL — ABNORMAL LOW (ref 18–38)

## 2016-08-10 MED ORDER — MAGNESIUM SULFATE 2 GM/50ML IV SOLN
2.0000 g | Freq: Once | INTRAVENOUS | Status: AC
  Administered 2016-08-10: 2 g via INTRAVENOUS
  Filled 2016-08-10: qty 50

## 2016-08-10 MED ORDER — POTASSIUM CHLORIDE CRYS ER 20 MEQ PO TBCR
30.0000 meq | EXTENDED_RELEASE_TABLET | ORAL | Status: AC
  Administered 2016-08-10 (×2): 30 meq via ORAL
  Filled 2016-08-10 (×2): qty 1

## 2016-08-10 MED ORDER — VITAL HIGH PROTEIN PO LIQD
1000.0000 mL | ORAL | Status: DC
  Administered 2016-08-10: 1000 mL
  Filled 2016-08-10: qty 1000

## 2016-08-10 MED ORDER — TRACE MINERALS CR-CU-MN-SE-ZN 10-1000-500-60 MCG/ML IV SOLN
INTRAVENOUS | Status: AC
  Administered 2016-08-10: 18:00:00 via INTRAVENOUS
  Filled 2016-08-10: qty 1920

## 2016-08-10 MED ORDER — INSULIN GLARGINE 100 UNIT/ML ~~LOC~~ SOLN
10.0000 [IU] | Freq: Two times a day (BID) | SUBCUTANEOUS | Status: DC
  Administered 2016-08-10 – 2016-08-19 (×17): 10 [IU] via SUBCUTANEOUS
  Filled 2016-08-10 (×18): qty 0.1

## 2016-08-10 MED ORDER — ENSURE ENLIVE PO LIQD
237.0000 mL | Freq: Two times a day (BID) | ORAL | Status: DC
  Administered 2016-08-11 – 2016-08-13 (×4): 237 mL via ORAL

## 2016-08-10 MED ORDER — POTASSIUM CHLORIDE 10 MEQ/50ML IV SOLN
10.0000 meq | INTRAVENOUS | Status: DC
  Filled 2016-08-10: qty 50

## 2016-08-10 NOTE — Progress Notes (Addendum)
PHARMACY - ADULT TOTAL PARENTERAL NUTRITION CONSULT NOTE   Pharmacy Consult:  TPN Indication:  Prolonged NPO status/high NG output   Patient Measurements: Height: 5\' 3"  (160 cm) Weight: 173 lb 1 oz (78.5 kg) IBW/kg (Calculated) : 52.4 TPN AdjBW (KG): 61.7 Body mass index is 30.66 kg/m. 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 Patient had a significant history of N/V for 2-4 weeks PTA but weight appears to have been maintained. Patient has had several NG tube replacements due to nausea and vomiting.  Pharmacy consulted to manage TPN since 06/23/16.    GI: persistent ileus and mesenteric ischemia, LGIB. OR 7/5 for thoracic aortic endovascular stent.  Prealbumin improved to 13.  PPI PO, PRN Zofran, BM x3.  Not on TF even though order in on [facility]. Endo: DM on glipizide/metformin PTA. CBGs 91-158 Insulin requirements in the past 24 hours: 4 units SSI + 40 units in TPN + Lantus 12 units BID Lytes: low Na/CL, K 3.5 (goal >/= 4 with ileus), others WNL Renal: SCr 0.87, BUN 26 - UOP 1 ml/kg/hr, NS at 48ml/hr. Even last 24hr. Net +8.5L Pulm: pleural effusion, down to RA - Brovana, Pulmicort, PRN albuterol Cards: aortic dissection stable, unsuccessful TEVAR - BP improving, tachy resolving - Lopressor, IV Lasix daily, PRN labetolol Hepatobil: LFTs / tbili WNL.   TG down to 159. Neuro: Celexa, Klonopin, PRN Dialudid/Xanax/APAP - pain score 8 ID: Cefepime for PNA + possible UTI.  S/p 2 courses of Merrem for PNA, s/p 3d Cipro >> s/p 7d vanc for E.faecalis UTI.  Afebrile, WBC down 17.1 Best Practices: Lovenox, SCDs, CHG, MC  TPN Access: PICC placed 06/21/16; exchanged 08/06/16 TPN start date: 06/23/16  Nutritional Goals (per RD recommendation on 7/3): 2000-2200 kCal and 110-120 gm protein  Current Nutrition:    TPN Full Liquids (consuming 25-50% of meals) Boost Breeze BID (received 1 yesterday)   Plan:  - Increase  Clinimix E 5/15 to 80 ml/hr.  Hold 20% ILE to minimize kCal provision.  Clinimix provides 1363 kCal and 96 g of protein per day.  PO intake to supplement the rest of patient's needs. - Daily multivitamin in TPN - Trace elements in TPN every other day, next due 7/9 - Continue TCTS SSI Q6H (to cover for intake) + 40 units regular insulin in TPN + reduce Lantus to 10 units BID. - KCL x 6 runs - Mag sulfate 2gm IV x 1.  Recheck labs on Wed. - F/U AM labs, CBGs, PO intake/diet advancement to wean off of TPN   Haley Graham, PharmD, BCPS Pager:  [phone] 08/10/2016, 12:24 PM   ======================   Addendum: - change KCL runs to PO per RN request   Haley Graham, PharmD, BCPS Pager:  [phone] 08/10/2016, 1:59 PM

## 2016-08-10 NOTE — Progress Notes (Signed)
Nutrition Follow-up  DOCUMENTATION CODES:   Obesity unspecified  INTERVENTION:   Recommend Nocturnal TF via Cortrak until PO intake adequate Vital 1.5 @ 70 ml/hr x 12 hours  30 ml Prostat TID Provides: 1560 kcal (78% of needs), 101 grams protein (92% of needs), and 641 ml free water.   Calorie Count x 48 hrs  Recommend D/C TPN   NUTRITION DIAGNOSIS:   Inadequate oral intake related to altered GI function as evidenced by meal completion < 50%. Ongoing.   GOAL:   Patient will meet greater than or equal to 90% of their needs Progressing.    MONITOR:   PO intake, Supplement acceptance, Diet advancement, I & O's  ASSESSMENT:   Pt with acute aortic dissection originating at the proximal descending thoracic aorta. Pt admitted for pain control, blood pressure control, bed rest and IV hydration.   7/6 Diet advanced to Full Liquids  Per pt she does not like a lot of the fluids they send on her meal trays. She does not like the [employer]. She is willing to try Ensure Enlive. She ate a container of peaches and pudding today. My be difficult to get pt to eat as she has had such limited PO's for such a long time.  Cortrak tube in place, however pt refused to have TF resumed on 7/6.   Medications reviewed and include: K-Dur 30 mEq PO every 4 hours, magic mouthwash, lanuts, novolog, lasix, colace Labs reviewed: Na 128 (L), Prealbumin 13 (L) CBG's: [phone]  TPN: Clinimix E 5/15 increased this PM to 80 ml/hr Provides: 1920 ml, 1363 kcal (68% of needs), and, 96 grams protein (87% of needs)  Diet Order:  Diet full liquid Room service appropriate? Yes; Fluid consistency: Thin .TPN (CLINIMIX-E) Adult .TPN (CLINIMIX-E) Adult  Skin:   (MASD perimeum)  Last BM:  7/9 medium  Height:   Ht Readings from Last 1 Encounters:  07/25/16 5\' 3"  (1.6 m)    Weight:   Wt Readings from Last 1 Encounters:  08/10/16 173 lb 1 oz (78.5 kg)    Ideal Body Weight:  52.2 kg  BMI:  Body  mass index is 30.66 kg/m.  Estimated Nutritional Needs:   Kcal:  2000-2200  Protein:  110-120 gm  Fluid:  per MD  EDUCATION NEEDS:   No education needs identified at this time  [place], [place], [place] Pager [phone] After Hours Pager

## 2016-08-10 NOTE — Progress Notes (Signed)
Physical Therapy Treatment Patient Details Name: Haley Graham MRN: [id number] DOB: [birth year] Today's Date: 08/10/2016    History of Present Illness Pt adm with type B aortic dissection. Treated non surgically with BP control and pain control. Pt developed pancreatitis and ileus with NG tube placed. TEVAR procedure 08/06/16. PMH - anxiety, arthritis    PT Comments    Patient is progressing well toward mobility goals and is in much better spirits today. Pt smiling and making jokes with RN and therapist. Pt ambulated 2H unit with min guard assist on RA. Continue to progress as tolerated.    Follow Up Recommendations  Home health PT;Supervision/Assistance - 24 hour     Equipment Recommendations  Rolling walker with 5" wheels    Recommendations for Other Services       Precautions / Restrictions Precautions Precautions: Fall Restrictions Weight Bearing Restrictions: No    Mobility  Bed Mobility Overal bed mobility: Needs Assistance Bed Mobility: Supine to Sit     Supine to sit: Min assist     General bed mobility comments: assist to elevate trunk into sitting  Transfers Overall transfer level: Needs assistance Equipment used: Rolling walker (2 wheeled) Transfers: Sit to/from Stand Sit to Stand: Min guard         General transfer comment: cues for safe hand placement  Ambulation/Gait Ambulation/Gait assistance: Min guard Ambulation Distance (Feet):  (2H unit) Assistive device: Rolling walker (2 wheeled) Gait Pattern/deviations: Step-through pattern;Decreased stride length Gait velocity: decr   General Gait Details: pt on RA; vitals WNL; min cues for breathing technique; pt with increased cadence   Stairs            Wheelchair Mobility    Modified Rankin (Stroke Patients Only)       Balance Overall balance assessment: Needs assistance Sitting-balance support: No upper extremity supported;Feet supported Sitting balance-Leahy Scale: Good      Standing balance support: During functional activity;No upper extremity supported Standing balance-Leahy Scale: Fair Standing balance comment: able to static stand without UE support                            Cognition Arousal/Alertness: Awake/alert Behavior During Therapy: Anxious;WFL for tasks assessed/performed Overall Cognitive Status: Within Functional Limits for tasks assessed                                        Exercises      General Comments        Pertinent Vitals/Pain Pain Assessment: Faces Faces Pain Scale: Hurts little more Pain Location: RLQ-intermittent Pain Descriptors / Indicators: Aching;Grimacing Pain Intervention(s): Monitored during session;Premedicated before session;Repositioned    Home Living                      Prior Function            PT Goals (current goals can now be found in the care plan section) Progress towards PT goals: Progressing toward goals    Frequency    Min 3X/week      PT Plan Current plan remains appropriate    Co-evaluation              AM-PAC PT "6 Clicks" Daily Activity  Outcome Measure  Difficulty turning over in bed (including adjusting bedclothes, sheets and blankets)?: A Lot Difficulty moving from lying on back  to sitting on the side of the bed? : Total Difficulty sitting down on and standing up from a chair with arms (e.g., wheelchair, bedside commode, etc,.)?: A Little Help needed moving to and from a bed to chair (including a wheelchair)?: A Little Help needed walking in hospital room?: A Little Help needed climbing 3-5 steps with a railing? : A Little 6 Click Score: 15    End of Session Equipment Utilized During Treatment: Gait belt Activity Tolerance: Patient tolerated treatment well Patient left: with call bell/phone within reach;in chair;with nursing/sitter in room Nurse Communication: Mobility status PT Visit Diagnosis: Muscle weakness (generalized)  (M62.81);Other abnormalities of gait and mobility (R26.89)     Time: 3790-2409 PT Time Calculation (min) (ACUTE ONLY): 34 min  Charges:  $Gait Training: 23-37 mins                    G Codes:       Haley Graham, PTA Pager: [phone]     Haley Graham 08/10/2016, 3:32 PM

## 2016-08-10 NOTE — Progress Notes (Addendum)
TCTS DAILY ICU PROGRESS NOTE                   [address]            [place] [address]          [phone]   4 Days Post-Op Procedure(s) (LRB): THORACIC AORTIC ENDOVASCULAR STENT GRAFT/Thorasic and Abdominal Angiogram, Entravascular ultrasound., Open femoral exposure,Left Brachial access, and patch angioplasty right femoral artery. (N/A)  Total Length of Stay:  LOS: 51 days   Subjective: She is trying to eat diced peaches this am. She is hesitant to eat because does not want the pain she had previously. She did have a small bowel movement.  Objective: Vital signs in last 24 hours: Temp:  [97.7 F (36.5 C)-98.9 F (37.2 C)] 98.9 F (37.2 C) (07/09 1155) Pulse Rate:  [82-113] 99 (07/09 0900) Cardiac Rhythm: Normal sinus rhythm (07/09 0800) Resp:  [14-25] 17 (07/09 0900) BP: (115-145)/(49-61) 118/50 (07/09 0900) SpO2:  [83 %-99 %] 93 % (07/09 0900) Weight:  [78.5 kg (173 lb 1 oz)] 78.5 kg (173 lb 1 oz) (07/09 0530)  Filed Weights   08/08/16 0500 08/09/16 0600 08/10/16 0530  Weight: 79.9 kg (176 lb 2.4 oz) 79.3 kg (174 lb 13.2 oz) 78.5 kg (173 lb 1 oz)      Intake/Output from previous day: 07/08 0701 - 07/09 0700 In: 3209.7 [P.O.:360; I.V.:2499.7; IV Piggyback:350] Out: 1900 [Urine:1900]  Intake/Output this shift: Total I/O In: 510 [P.O.:300; I.V.:210] Out: 1550 [Urine:1550]  Current Meds: Scheduled Meds: . arformoterol  15 mcg Nebulization BID  . budesonide (PULMICORT) nebulizer solution  0.5 mg Nebulization BID  . Chlorhexidine Gluconate Cloth  6 each Topical Daily  . citalopram  40 mg Oral Daily  . clonazePAM  1 mg Oral QHS  . docusate sodium  100 mg Oral Daily  . enoxaparin (LOVENOX) injection  40 mg Subcutaneous Q24H  . feeding supplement  1 Container Oral BID BM  . furosemide  40 mg Intravenous Daily  . insulin aspart  0-24 Units Subcutaneous Q6H  . insulin glargine  10 Units Subcutaneous BID  . magic mouthwash  5 mL Oral QID  .  metoprolol tartrate  25 mg Oral BID  . pantoprazole  40 mg Oral Daily  . sodium chloride flush  10-40 mL Intracatheter Q12H   Continuous Infusions: . Marland KitchenTPN (CLINIMIX-E) Adult 70 mL/hr at 08/09/16 1749  . Marland KitchenTPN (CLINIMIX-E) Adult    . sodium chloride Stopped (08/06/16 1958)  . sodium chloride 10 mL/hr at 08/09/16 0800  . ceFEPime (MAXIPIME) IV Stopped (08/10/16 0944)  . feeding supplement (VITAL 1.5 CAL)    . lactated ringers Stopped (08/07/16 0900)  . magnesium sulfate 1 - 4 g bolus IVPB    . magnesium sulfate 1 - 4 g bolus IVPB    . potassium chloride     PRN Meds:.Place/Maintain arterial line **AND** sodium chloride, acetaminophen **OR** acetaminophen, ALPRAZolam, alum & mag hydroxide-simeth, Gerhardt's butt cream, guaiFENesin-dextromethorphan, HYDROmorphone (DILAUDID) injection, labetalol, magnesium sulfate 1 - 4 g bolus IVPB, metoprolol tartrate, ondansetron (ZOFRAN) IV, phenol, pneumococcal 23 valent vaccine, simethicone, sodium chloride flush  Heart: RRR Lungs: Clear to auscultation bilaterally Abdomen: Soft, no real tenderness with palpation, bowel sounds present. Extremities: Warm, pulses intact.   Lab Results: CBC:  Recent Labs  08/08/16 0405 08/10/16 0453  WBC 21.9* 17.1*  HGB 9.8* 9.2*  HCT 30.0* 28.7*  PLT 194 164   BMET:   Recent Labs  08/09/16  0439 08/10/16 0453  NA 131* 128*  K 3.2* 3.5  CL 100* 98*  CO2 25 25  GLUCOSE 88 96  BUN 26* 26*  CREATININE 0.77 0.87  CALCIUM 7.1* 7.7*    CMET: Lab Results  Component Value Date   WBC 17.1 (H) 08/10/2016   HGB 9.2 (L) 08/10/2016   HCT 28.7 (L) 08/10/2016   PLT 164 08/10/2016   GLUCOSE 96 08/10/2016   TRIG 159 (H) 08/10/2016   ALT 12 (L) 08/10/2016   AST 14 (L) 08/10/2016   NA 128 (L) 08/10/2016   K 3.5 08/10/2016   CL 98 (L) 08/10/2016   CREATININE 0.87 08/10/2016   BUN 26 (H) 08/10/2016   CO2 25 08/10/2016   INR 1.24 08/06/2016      PT/INR:  No results for input(s): LABPROT, INR in the  last 72 hours. Radiology: Dg Chest Port 1 View  Result Date: 08/10/2016 CLINICAL DATA:  61 year old female with shortness breath. Subsequent encounter. EXAM: PORTABLE CHEST 1 VIEW COMPARISON:  08/06/2016. FINDINGS: Post endovascular stenting thoracic aorta. Cardiomegaly. Left base consolidation may represent atelectasis and pleural fluid although not able to exclude infiltrate. Appearance unchanged. Pulmonary vascular congestion greatest centrally. Right base subsegmental atelectasis. No gross pneumothorax. Left central line tip proximal right atrium level. To be in the region of the distal superior vena cava, this could be retracted by 2.2 cm. Feeding tube in place, tip not imaged. IMPRESSION: Right central line removed. Left central line tip right atrium level. To be in the distal superior vena cava, this would need to be retracted by 2 cm. Post endovascular stent thoracic aorta. Pulmonary vascular congestion greatest centrally. Basilar consolidation greater on left which may represent atelectasis and pleural effusion although difficult to exclude infiltrate. Appearance unchanged. Electronically Signed   By: Genia Del M.D.   On: 08/10/2016 07:50     Assessment/Plan: 1. CV-SR in the 90's. SBP well controlled as 130's or less. Continue Lopressor 25 mg bid. 2. Pulmonary-On room air. Continue Pulmicort and Brovana nebs. 3. GI- Continue TPN, boost, vital supplements, calorie counts. Wean off TPN as able. 4. Edema secondary to low albumin. On Lasix 40 mg IV daily. 5. DM-CBGs 158/91/157. On Insulin. Will consider restarting Glipizide once tolerating oral better. 6. Anemia-H and H stable at 9.2 and 28.7 . She is NOT on ecasa or Lovenox. 7. Leukocytosis-WBC decreased to 17,100. 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 8. Daughter requests evaluation for CIR, although HH has been recommended. iIll ask for OT evaluation.  [name]  PA-C 08/10/2016 12:43 PM   Patient examined, agree with above assessment Her mesenteric blood flow hopefully is improved after successful TEVAR Patient needs to be slowly weaned off T NA either to oral intake or enteric tube feeds or a combination of both. Trickle tube feeds with half-strength vital have been started She will need to remain hospitalized in ICU during that process because of risk of acute bowel ischemia. We'll follow.  Tharon Aquas trigt M.D.

## 2016-08-10 NOTE — Progress Notes (Addendum)
Progress Note  SUBJECTIVE:    POD #4  Still with abdominal pain but better. Very hesitant to eat more as she is afraid of making things worse.   OBJECTIVE:   Vitals:   08/10/16 0832 08/10/16 0900  BP:  (!) 118/50  Pulse:  99  Resp:  17  Temp: 98.7 F (37.1 C)     Intake/Output Summary (Last 24 hours) at 08/10/16 1030 Last data filed at 08/10/16 1000  Gross per 24 hour  Intake          2969.66 ml  Output             2800 ml  Net           169.66 ml   Abdomen soft without distension. No tenderness to palpation. Right groin incisional VAC in place.  2+ DP pulses bilaterally. ASSESSMENT/PLAN:   61 y.o. female is s/p: TEVAR 4 Days Post-Op   Abdominal pain continuing to improve. Had 1 liquid BM this am.  Only ate around 25% of meal this am. Afraid of eating too much. Does want to try crackers. Will continue TNA for now. ?Advance diet.  Leukocytosis improving.  Continue to mobilize.  Joelene Millin A Trinh 08/10/2016 10:30 AM -- LABS:   CBC    Component Value Date/Time   WBC 17.1 (H) 08/10/2016 0453   HGB 9.2 (L) 08/10/2016 0453   HCT 28.7 (L) 08/10/2016 0453   PLT 164 08/10/2016 0453    BMET    Component Value Date/Time   NA 128 (L) 08/10/2016 0453   K 3.5 08/10/2016 0453   CL 98 (L) 08/10/2016 0453   CO2 25 08/10/2016 0453   GLUCOSE 96 08/10/2016 0453   BUN 26 (H) 08/10/2016 0453   CREATININE 0.87 08/10/2016 0453   CALCIUM 7.7 (L) 08/10/2016 0453   GFRNONAA >60 08/10/2016 0453   GFRAA >60 08/10/2016 0453    COAG Lab Results  Component Value Date   INR 1.24 08/06/2016   INR 1.08 07/29/2016   INR 1.72 07/19/2016   No results found for: PTT  ANTIBIOTICS:   Anti-infectives    Start     Dose/Rate Route Frequency Ordered Stop   08/06/16 1400  vancomycin (VANCOCIN) IVPB 1000 mg/200 mL premix     1,000 mg 200 mL/hr over 60 Minutes Intravenous To ShortStay Surgical 08/06/16 0038 08/06/16 1814   08/04/16 1100  ceFEPIme (MAXIPIME) 1 g in dextrose 5 % 50  mL IVPB     1 g 100 mL/hr over 30 Minutes Intravenous Every 12 hours 08/04/16 1049     07/27/16 1400  meropenem (MERREM) 1 g in sodium chloride 0.9 % 100 mL IVPB     1 g 200 mL/hr over 30 Minutes Intravenous Every 8 hours 07/27/16 0849 07/29/16 2143   07/25/16 2200  meropenem (MERREM) 1 g in sodium chloride 0.9 % 100 mL IVPB  Status:  Discontinued     1 g 200 mL/hr over 30 Minutes Intravenous Every 12 hours 07/25/16 1223 07/27/16 0849   07/23/16 1000  ceFEPIme (MAXIPIME) 1 g in dextrose 5 % 50 mL IVPB  Status:  Discontinued     1 g 100 mL/hr over 30 Minutes Intravenous Every 8 hours 07/23/16 0911 07/23/16 0917   07/23/16 1000  meropenem (MERREM) 1 g in sodium chloride 0.9 % 100 mL IVPB  Status:  Discontinued     1 g 200 mL/hr over 30 Minutes Intravenous Every 8 hours 07/23/16 0919 07/25/16 1223   07/18/16 0000  levofloxacin (LEVAQUIN) IVPB 500 mg  Status:  Discontinued     500 mg 100 mL/hr over 60 Minutes Intravenous Every 24 hours 07/17/16 0903 07/17/16 0938   07/17/16 1100  vancomycin (VANCOCIN) 1,500 mg in sodium chloride 0.9 % 500 mL IVPB     1,500 mg 250 mL/hr over 120 Minutes Intravenous Every 24 hours 07/17/16 0946 07/23/16 1221   07/17/16 1000  levofloxacin (LEVAQUIN) tablet 500 mg  Status:  Discontinued     500 mg Oral Daily 07/17/16 0851 07/17/16 0903   07/15/16 0900  ciprofloxacin (CIPRO) tablet 500 mg  Status:  Discontinued     500 mg Oral 2 times daily 07/15/16 0828 07/17/16 0851   07/14/16 1115  levofloxacin (LEVAQUIN) tablet 500 mg  Status:  Discontinued     500 mg Oral Daily 07/14/16 1107 07/14/16 1214   06/26/16 0830  meropenem (MERREM) 1 g in sodium chloride 0.9 % 100 mL IVPB  Status:  Discontinued     1 g 200 mL/hr over 30 Minutes Intravenous Every 8 hours 06/26/16 0810 07/06/16 0804   06/22/16 1800  clindamycin (CLEOCIN) IVPB 600 mg  Status:  Discontinued     600 mg 100 mL/hr over 30 Minutes Intravenous Every 8 hours 06/22/16 1723 06/26/16 0810   06/22/16 1000   azithromycin (ZITHROMAX) 250 mg in dextrose 5 % 125 mL IVPB  Status:  Discontinued     250 mg 125 mL/hr over 60 Minutes Intravenous Every 24 hours 06/22/16 0826 06/27/16 1019   06/22/16 0900  levofloxacin (LEVAQUIN) IVPB 500 mg  Status:  Discontinued     500 mg 100 mL/hr over 60 Minutes Intravenous Every 24 hours 06/22/16 0759 06/22/16 [place], PA-C [facility] Office: [phone] Pager: [phone] 08/10/2016 10:30 AM    I have independently interviewed patient and agree with PA assessment and plan above. Ok for slow advancement of diet as tolerates. Possibly transfer out of icu in near future.   Roshawn Lacina C. Donzetta Matters, MD [facility] [place] Office: [phone] Pager: [phone]

## 2016-08-10 NOTE — Progress Notes (Signed)
Received a call from daughter, Augusto Garbe, requesting clarification of what would qualify her mother for a possible inpatient acute rehabilitation admission. I clarified with her that P.T. Is recommending Home health at this time. Daughter is requesting an inpt rehab consult for she feels that therapy is inaccurate and that pt will not be able to d/c home at this level. I recommend an OT eval and inpt rehab consult at this time. Please advise. [phone]

## 2016-08-11 LAB — BASIC METABOLIC PANEL
Anion gap: 7 (ref 5–15)
BUN: 25 mg/dL — ABNORMAL HIGH (ref 6–20)
CO2: 23 mmol/L (ref 22–32)
Calcium: 7.8 mg/dL — ABNORMAL LOW (ref 8.9–10.3)
Chloride: 99 mmol/L — ABNORMAL LOW (ref 101–111)
Creatinine, Ser: 0.89 mg/dL (ref 0.44–1.00)
GFR calc Af Amer: >60 mL/min (ref >60)
GFR calc non Af Amer: >60 mL/min (ref >60)
Glucose, Bld: 130 mg/dL — ABNORMAL HIGH (ref 65–99)
Potassium: 4.2 mmol/L (ref 3.5–5.1)
Sodium: 129 mmol/L — ABNORMAL LOW (ref 135–145)

## 2016-08-11 LAB — CBC
HCT: 29.1 % — ABNORMAL LOW (ref 36.0–46.0)
HEMOGLOBIN: 9.2 g/dL — AB (ref 12.0–15.0)
MCH: 29.5 pg (ref 26.0–34.0)
MCHC: 31.6 g/dL (ref 30.0–36.0)
MCV: 93.3 fL (ref 78.0–100.0)
PLATELETS: 154 10*3/uL (ref 150–400)
RBC: 3.12 MIL/uL — ABNORMAL LOW (ref 3.87–5.11)
RDW: 16 % — AB (ref 11.5–15.5)
WBC: 17 10*3/uL — ABNORMAL HIGH (ref 4.0–10.5)

## 2016-08-11 LAB — GLUCOSE, CAPILLARY
Glucose-Capillary: 114 mg/dL — ABNORMAL HIGH (ref 65–99)
Glucose-Capillary: 119 mg/dL — ABNORMAL HIGH (ref 65–99)
Glucose-Capillary: 129 mg/dL — ABNORMAL HIGH (ref 65–99)
Glucose-Capillary: 153 mg/dL — ABNORMAL HIGH (ref 65–99)
Glucose-Capillary: 159 mg/dL — ABNORMAL HIGH (ref 65–99)

## 2016-08-11 MED ORDER — VITAL 1.5 CAL PO LIQD
1000.0000 mL | ORAL | Status: DC
  Filled 2016-08-11: qty 1000

## 2016-08-11 MED ORDER — VITAL 1.5 CAL PO LIQD
1000.0000 mL | ORAL | Status: DC
  Administered 2016-08-11: 1000 mL
  Filled 2016-08-11 (×2): qty 1000

## 2016-08-11 MED ORDER — PRO-STAT SUGAR FREE PO LIQD
30.0000 mL | Freq: Three times a day (TID) | ORAL | Status: DC
  Administered 2016-08-11 – 2016-08-13 (×5): 30 mL
  Filled 2016-08-11 (×5): qty 30

## 2016-08-11 NOTE — Progress Notes (Addendum)
Progress Note  SUBJECTIVE:    POD #5  Abdominal pain is getter better. Taking it slowly with food.   OBJECTIVE:   Vitals:   08/11/16 0600 08/11/16 0623  BP: (!) 136/49   Pulse: 100 96  Resp: (!) 27 17  Temp:      Intake/Output Summary (Last 24 hours) at 08/11/16 0750 Last data filed at 08/11/16 0600  Gross per 24 hour  Intake          2398.17 ml  Output             2870 ml  Net          -471.83 ml   Was sleeping comfortably upon entering room Abdomen soft without tenderness to palpation Right groin prevena dressing 2+ DP pulses bilaterally  ASSESSMENT/PLAN:   61 y.o. female is s/p: TEVAR 5 Days Post-Op   Abdominal pain improving.  Leukocytosis remains at 17k today. Afebrile. Will take off prevena dressing on POD 7. Unless leukocytosis does not improve, will take off sooner.  Advance diet as tolerated. Weaning off TNA.   Alvia Grove 08/11/2016 7:50 AM -- LABS:   CBC    Component Value Date/Time   WBC 17.0 (H) 08/11/2016 0409   HGB 9.2 (L) 08/11/2016 0409   HCT 29.1 (L) 08/11/2016 0409   PLT 154 08/11/2016 0409    BMET    Component Value Date/Time   NA 129 (L) 08/11/2016 0409   K 4.2 08/11/2016 0409   CL 99 (L) 08/11/2016 0409   CO2 23 08/11/2016 0409   GLUCOSE 130 (H) 08/11/2016 0409   BUN 25 (H) 08/11/2016 0409   CREATININE 0.89 08/11/2016 0409   CALCIUM 7.8 (L) 08/11/2016 0409   GFRNONAA >60 08/11/2016 0409   GFRAA >60 08/11/2016 0409    COAG Lab Results  Component Value Date   INR 1.24 08/06/2016   INR 1.08 07/29/2016   INR 1.72 07/19/2016   No results found for: PTT  ANTIBIOTICS:   Anti-infectives    Start     Dose/Rate Route Frequency Ordered Stop   08/06/16 1400  vancomycin (VANCOCIN) IVPB 1000 mg/200 mL premix     1,000 mg 200 mL/hr over 60 Minutes Intravenous To ShortStay Surgical 08/06/16 0038 08/06/16 1814   08/04/16 1100  ceFEPIme (MAXIPIME) 1 g in dextrose 5 % 50 mL IVPB     1 g 100 mL/hr over 30 Minutes  Intravenous Every 12 hours 08/04/16 1049     07/27/16 1400  meropenem (MERREM) 1 g in sodium chloride 0.9 % 100 mL IVPB     1 g 200 mL/hr over 30 Minutes Intravenous Every 8 hours 07/27/16 0849 07/29/16 2143   07/25/16 2200  meropenem (MERREM) 1 g in sodium chloride 0.9 % 100 mL IVPB  Status:  Discontinued     1 g 200 mL/hr over 30 Minutes Intravenous Every 12 hours 07/25/16 1223 07/27/16 0849   07/23/16 1000  ceFEPIme (MAXIPIME) 1 g in dextrose 5 % 50 mL IVPB  Status:  Discontinued     1 g 100 mL/hr over 30 Minutes Intravenous Every 8 hours 07/23/16 0911 07/23/16 0917   07/23/16 1000  meropenem (MERREM) 1 g in sodium chloride 0.9 % 100 mL IVPB  Status:  Discontinued     1 g 200 mL/hr over 30 Minutes Intravenous Every 8 hours 07/23/16 0919 07/25/16 1223   07/18/16 0000  levofloxacin (LEVAQUIN) IVPB 500 mg  Status:  Discontinued     500 mg 100  mL/hr over 60 Minutes Intravenous Every 24 hours 07/17/16 0903 07/17/16 0938   07/17/16 1100  vancomycin (VANCOCIN) 1,500 mg in sodium chloride 0.9 % 500 mL IVPB     1,500 mg 250 mL/hr over 120 Minutes Intravenous Every 24 hours 07/17/16 0946 07/23/16 1221   07/17/16 1000  levofloxacin (LEVAQUIN) tablet 500 mg  Status:  Discontinued     500 mg Oral Daily 07/17/16 0851 07/17/16 0903   07/15/16 0900  ciprofloxacin (CIPRO) tablet 500 mg  Status:  Discontinued     500 mg Oral 2 times daily 07/15/16 0828 07/17/16 0851   07/14/16 1115  levofloxacin (LEVAQUIN) tablet 500 mg  Status:  Discontinued     500 mg Oral Daily 07/14/16 1107 07/14/16 1214   06/26/16 0830  meropenem (MERREM) 1 g in sodium chloride 0.9 % 100 mL IVPB  Status:  Discontinued     1 g 200 mL/hr over 30 Minutes Intravenous Every 8 hours 06/26/16 0810 07/06/16 0804   06/22/16 1800  clindamycin (CLEOCIN) IVPB 600 mg  Status:  Discontinued     600 mg 100 mL/hr over 30 Minutes Intravenous Every 8 hours 06/22/16 1723 06/26/16 0810   06/22/16 1000  azithromycin (ZITHROMAX) 250 mg in dextrose 5  % 125 mL IVPB  Status:  Discontinued     250 mg 125 mL/hr over 60 Minutes Intravenous Every 24 hours 06/22/16 0826 06/27/16 1019   06/22/16 0900  levofloxacin (LEVAQUIN) IVPB 500 mg  Status:  Discontinued     500 mg 100 mL/hr over 60 Minutes Intravenous Every 24 hours 06/22/16 0759 06/22/16 [place], PA-C [facility] Office: [phone] Pager: [phone] 08/11/2016 7:50 AM  I have independently interviewed patient and agree with PA assessment and plan above. Continue diet as tolerates.   Yoshimi Sarr C. Donzetta Matters, MD [facility] [place] Office: [phone] Pager: [phone]

## 2016-08-11 NOTE — Progress Notes (Signed)
PHARMACY - ADULT TOTAL PARENTERAL NUTRITION CONSULT NOTE   Pharmacy Consult:  TPN Indication:  Prolonged NPO status/high NG output   Patient Measurements: Height: 5\' 3"  (160 cm) Weight: 182 lb 5.1 oz (82.7 kg) IBW/kg (Calculated) : 52.4 TPN AdjBW (KG): 61.7 Body mass index is 32.3 kg/m. 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 Patient had a significant history of N/V for 2-4 weeks PTA but weight appears to have been maintained. Patient has had several NG tube replacements due to nausea and vomiting.  Pharmacy consulted to manage TPN since 06/23/16.    TPN was reduced to 50 ml/hr yesterday night when TF was initiated.  GI: persistent ileus and mesenteric ischemia, LGIB. OR 7/5 for thoracic aortic endovascular stent.  Prealbumin improved to 13.  PPI PO, PRN Zofran, BM x1.  Endo: DM on glipizide/metformin PTA. CBGs controlled Insulin requirements in the past 24 hours: 4 units SSI + 40 units in TPN + Lantus 10 units BID Lytes: low Na/CL, others WNL Renal: SCr 0.89, BUN 25 - UOP 1.4 ml/kg/hr, NS at 32ml/hr. Net +8.2L Pulm: pleural effusion, on HFNC - Brovana, Pulmicort, PRN albuterol Cards: aortic dissection stable, unsuccessful TEVAR - BP improving, tachy resolving - Lopressor, IV Lasix daily, PRN labetolol Hepatobil: LFTs / tbili WNL.   TG down to 159. Neuro: Celexa, Klonopin, PRN Dialudid/Xanax/APAP - pain score 2-7 ID: Cefepime for PNA + possible UTI.  S/p 2 courses of Merrem for PNA, s/p 3d Cipro >> s/p 7d vanc for E.faecalis UTI.  Afebrile, WBC down 17 Best Practices: Lovenox, SCDs, CHG, MC  TPN Access: PICC placed 06/21/16; exchanged 08/06/16 TPN start date: 06/23/16  Nutritional Goals (per RD recommendation on 7/3): 2000-2200 kCal and 110-120 gm protein  Current Nutrition:    TPN Full Liquids (consuming 25-50% of meals) Boost Breeze BID (received 0 yesterday) Vital HP at 30 ml/hr = 720 kCal and  63 gm protein per day   Plan:  - Wean TPN off per Vascular:  reduce Clinimix E 5/15 to 25 ml/hr, then stop at 1800.   - Continue TCTS SSI Q6H and Lantus to 10 units BID per MD.  Titration per MD. - D/C TPN labs and standard orders    Haley Graham D. [name], PharmD, BCPS Pager:  [phone] 08/11/2016, 9:12 AM

## 2016-08-11 NOTE — Progress Notes (Signed)
TCTS BRIEF SICU PROGRESS NOTE  5 Days Post-Op  S/P Procedure(s) (LRB): THORACIC AORTIC ENDOVASCULAR STENT GRAFT/Thorasic and Abdominal Angiogram, Entravascular ultrasound., Open femoral exposure,Left Brachial access, and patch angioplasty right femoral artery. (N/A)   Stable day  Plan: Continue current plan  Rexene Alberts, MD 08/11/2016 7:39 PM

## 2016-08-11 NOTE — Progress Notes (Addendum)
Nutrition Follow-up  DOCUMENTATION CODES:   Obesity unspecified  INTERVENTION:   Vital 1.5 @ 40 ml/hr x 12 hours via Cortrak tube 30 ml Prostat TID Provides: 480 ml, 1020 kcal (51% of needs), 77 grams protein (70% of needs), and 366 ml free water.  Continue Ensure Enlive BID Add Magic cup (vanilla) to all meal trays Continue Calorie Count   If pt's intake does not improve Recommend increasing Vital 1.5 to 70 ml/hr x 12 hours Provides: 1560 kcal (78% of needs), 101 grams protein (92% of needs), and 641 ml free water.    NUTRITION DIAGNOSIS:   Inadequate oral intake related to altered GI function as evidenced by meal completion < 50%. Ongoing.   GOAL:   Patient will meet greater than or equal to 90% of their needs Progressing.   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 Pt admitted for pain control, blood pressure control, bed rest and IV hydration.   7/5 s/p TEVAR  Calorie Count:  Pt on Full Liquid diet but eating very poorly Today pt had 1 container of peaches and 1/2 of a container of ice cream.  Ensure Enlive ordered BID however pt had not received any yet today. Offered pt supplement and she drank a few sips during visit.   TPN d/c'ed after current bag.  7/9 pt started on Vital High Protein @ 30 ml/hr at 2200, ran until 0700 as nocturnal feedings (270 kcal, 23 grams protein)  Diet Order:  Diet full liquid Room service appropriate? Yes; Fluid consistency: Thin .TPN (CLINIMIX-E) Adult  Skin:   (MASD perimeum)  Last BM:  7/9 medium  Height:   Ht Readings from Last 1 Encounters:  07/25/16 5\' 3"  (1.6 m)    Weight:   Wt Readings from Last 1 Encounters:  08/11/16 182 lb 5.1 oz (82.7 kg)    Ideal Body Weight:  52.2 kg  BMI:  Body mass index is 32.3 kg/m.  Estimated  Nutritional Needs:   Kcal:  2000-2200  Protein:  110-120 gm  Fluid:  per MD  EDUCATION NEEDS:   No education needs identified at this time  [place], [place], [place] Pager [phone] After Hours Pager

## 2016-08-11 NOTE — Care Management Note (Addendum)
Case Management Note Previous note created by: Marvetta Gibbons RN, BSN Unit 2W-Case Manager-- [place] coverage [phone]  Patient Details  Name: Haley Graham MRN: [id number] Date of Birth: 1955/03/31  Subjective/Objective:  Pt admitted with Stanford type B aortic dissection-- With pancreatitis and ileus after Stanford type B dissection- has NGT in place                  Action/Plan: PTA pt lived at home with spouse- referral for FMLA paperwork needed by daughter- have referred her to her mother's primary care doctor regarding this- CM to follow for d/c needs  Expected Discharge Date:                  Expected Discharge Plan:  [place]  In-House Referral:     Discharge planning Services  CM Consult  Post Acute Care Choice:  Home Health Choice offered to:     DME Arranged:    DME Agency:     HH Arranged:    [place] Agency:     Status of Service:  In process, will continue to follow  If discussed at Long Length of Stay Meetings, dates discussed:  6/5, 6/7, 6/12, 6/14, 6/19, 6/21  Discharge Disposition:   Additional Comments: 08/11/2016 Elenor Quinones, RN, BSN [phone] 5 days s/p TAEVR.  TPN was reduced to 50 ml/hr yesterday night when TF was initiated (plan is to wean pt off TPN).  Pt is also slowing progressing on liquid diet.  Recommendations currently are for [facility] and DME - orders will be written closer to discharge so arrangements can be made.  Daughter contacted CIR directly to request admittance consideration.  07/23/16- 1000- Kristi Webster RN, CM- pt continues with prolonged ileus and ischemia - on TPA and only sips of clears- plan per notes for possible thoracic aortic endovascular stent later this week.  Per PT recommendations for [facility] f/u - will need orders prior to discharge- CM will continue to follow for d/c needs when medically ready.   07/07/16- 1245- Kristi Webster RN, CM- pt remains- NPO, TNA- per MD note- plan repeat CT abd-pelvis to assess  bowel viability- CM to continue to follow.   Maryclare Labrador, RN 08/11/2016, 10:32 AM

## 2016-08-11 NOTE — Progress Notes (Signed)
5 Days Post-Op Procedure(s) (LRB): THORACIC AORTIC ENDOVASCULAR STENT GRAFT/Thorasic and Abdominal Angiogram, Entravascular ultrasound., Open femoral exposure,Left Brachial access, and patch angioplasty right femoral artery. (N/A) Subjective: Patient is transitioning from TPN to nocturnal tube feeds supplemented with oral intake [full liquids] during day. I discussed her diet with the nutritionist today and the nocturnal tube feeds will be slightly increased to 40 cc per hour. So far the patient has had no evidence of ileus or nausea-vomiting. Bowel movements have been slightly formed   The patient is finished a 1 week course of IV cefepime for leukocytosis without fever. Left lower lobe atelectasis on x-ray. Cultures of urine negative. C. difficile assay negative. Right lower abdominal incision forTEVAR is healing.    Objective: Vital signs in last 24 hours: Temp:  [98 F (36.7 C)-99.1 F (37.3 C)] 99 F (37.2 C) (07/10 1700) Pulse Rate:  [87-105] 102 (07/10 1800) Cardiac Rhythm: Normal sinus rhythm (07/10 0800) Resp:  [14-35] 14 (07/10 1800) BP: (102-155)/(45-85) 141/50 (07/10 1800) SpO2:  [92 %-99 %] 92 % (07/10 1800) Weight:  [182 lb 5.1 oz (82.7 kg)] 182 lb 5.1 oz (82.7 kg) (07/10 0600)  Hemodynamic parameters for last 24 hours:    Intake/Output from previous day: 07/09 0701 - 07/10 0700 In: 2398.2 [P.O.:840; I.V.:1292.2; NG/GT:216; IV Piggyback:50] Out: 2870 [Urine:2870] Intake/Output this shift: Total I/O In: 1240 [P.O.:600; I.V.:430; NG/GT:60; IV Piggyback:150] Out: 2300 [Urine:2300]  Abdominal exam is nontender not significantly distended   Lab Results:  Recent Labs  08/10/16 0453 08/11/16 0409  WBC 17.1* 17.0*  HGB 9.2* 9.2*  HCT 28.7* 29.1*  PLT 164 154   BMET:  Recent Labs  08/10/16 0453 08/11/16 0409  NA 128* 129*  K 3.5 4.2  CL 98* 99*  CO2 25 23  GLUCOSE 96 130*  BUN 26* 25*  CREATININE 0.87 0.89  CALCIUM 7.7* 7.8*    PT/INR: No results for  input(s): LABPROT, INR in the last 72 hours. ABG    Component Value Date/Time   PHART 7.471 (H) 08/06/2016 1830   HCO3 28.3 (H) 08/06/2016 1830   TCO2 29 08/06/2016 1830   ACIDBASEDEF 3.0 (H) 06/28/2016 1615   O2SAT 100.0 08/06/2016 1830   CBG (last 3)   Recent Labs  08/11/16 0601 08/11/16 1228 08/11/16 1748  GLUCAP 159* 119* 114*    Assessment/Plan: S/P Procedure(s) (LRB): THORACIC AORTIC ENDOVASCULAR STENT GRAFT/Thorasic and Abdominal Angiogram, Entravascular ultrasound., Open femoral exposure,Left Brachial access, and patch angioplasty right femoral artery. (N/A) As she transitions off TPN her insulin requirements will need to be adjusted and diuretic requirements will need to be adjusted. She may be candidate for CIR rehabilitation    LOS: 52 days    [name] 08/11/2016

## 2016-08-11 NOTE — Progress Notes (Signed)
Patient refusing to walk with RN at this moment.

## 2016-08-11 NOTE — Progress Notes (Signed)
Patient tearful because she is pain and is nauseous. Pain medicine and antiemetic given to patient

## 2016-08-12 ENCOUNTER — Ambulatory Visit: Payer: BLUE CROSS/BLUE SHIELD | Admitting: Cardiothoracic Surgery

## 2016-08-12 LAB — GLUCOSE, CAPILLARY
Glucose-Capillary: 143 mg/dL — ABNORMAL HIGH (ref 65–99)
Glucose-Capillary: 126 mg/dL — ABNORMAL HIGH (ref 65–99)
Glucose-Capillary: 139 mg/dL — ABNORMAL HIGH (ref 65–99)

## 2016-08-12 LAB — C DIFFICILE QUICK SCREEN W PCR REFLEX
C Diff antigen: NEGATIVE
C Diff interpretation: NOT DETECTED
C Diff toxin: NEGATIVE

## 2016-08-12 MED ORDER — VITAL 1.5 CAL PO LIQD
1000.0000 mL | ORAL | Status: DC
  Filled 2016-08-12 (×2): qty 1000

## 2016-08-12 MED ORDER — ALPRAZOLAM 0.5 MG PO TABS
0.5000 mg | ORAL_TABLET | Freq: Two times a day (BID) | ORAL | Status: DC | PRN
  Administered 2016-08-12 – 2016-08-22 (×19): 0.5 mg via ORAL
  Filled 2016-08-12 (×20): qty 1

## 2016-08-12 MED ORDER — FUROSEMIDE 10 MG/ML IJ SOLN
20.0000 mg | Freq: Every day | INTRAMUSCULAR | Status: DC
  Administered 2016-08-12 – 2016-08-22 (×11): 20 mg via INTRAVENOUS
  Filled 2016-08-12 (×11): qty 2

## 2016-08-12 NOTE — Progress Notes (Signed)
Nutrition Follow-up  DOCUMENTATION CODES:   Obesity unspecified  INTERVENTION:   Calorie Count - PO remains very poor 1/4 ice cream at breakfast  Continue:  Vital 1.5 @ 40 ml/hr x 12 hours via Cortrak tube 30 ml Prostat TID Provides: 480 ml, 1020 kcal (51% of needs), 77 grams protein (70% of needs), and 366 ml free water.  Continue Ensure Enlive BID Add Magic cup (vanilla) to all meal trays Continue Calorie Count   If pt's intake does not improve Recommend increasing Vital 1.5 to 70 ml/hr x 12 hours Provides: 1560 kcal (78% of needs), 101 grams protein (92% of needs), and 641 ml free water.    NUTRITION DIAGNOSIS:   Inadequate oral intake related to altered GI function as evidenced by meal completion < 50%. Ongoing.  GOAL:   Patient will meet greater than or equal to 90% of their needs Progressing.   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 Pt admitted for pain control, blood pressure control, bed rest and IV hydration.   Calorie Count: Pt has only ate 1/4 of her ice cream today Pt states that no increase in abd pain with TF overnight   7/5 s/p TEVAR Per PA abd is semi soft, some distention, high pitched tinkling bowel sounds, diffusely tender KUB pending for am, cdiff pending  Diet Order:  Diet full liquid Room service appropriate? Yes; Fluid consistency: Thin  Skin:   (MASD perimeum)  Last BM:  7/11 large/liquid  Height:   Ht Readings from Last 1 Encounters:  07/25/16 5\' 3"  (1.6 m)    Weight:   Wt Readings from Last 1 Encounters:  08/11/16 182 lb 5.1 oz (82.7 kg)    Ideal Body Weight:  52.2 kg  BMI:  Body mass index is 32.3 kg/m.  Estimated Nutritional Needs:   Kcal:  2000-2200  Protein:  110-120 gm  Fluid:  per MD  EDUCATION NEEDS:   No education  needs identified at this time  [place], [place], [place] Pager [phone] After Hours Pager

## 2016-08-12 NOTE — Progress Notes (Signed)
On physical exam, today the abdominal exam is as follows: Semi soft, some distention, high pitched tinkling bowel sounds, diffusely tender,

## 2016-08-12 NOTE — Discharge Summary (Signed)
Physician Discharge Summary       [address]       [place] [address]             [phone]    Patient ID: Haley Graham MRN: [id number] DOB/AGE: 02-26-1955 61 y.o.  Admit date: 06/20/2016 Discharge date: 08/22/2016  Admission Diagnoses: 1. 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 Hypertension 3. Mild metabolic acidosis  Active Diagnoses:  1. COPD 2. Anxiety 3. Depression 4. Tobacco abuse 5. Diabetes mellitus without complication (Haley Graham) 6. Gastroparesis 7. GERD (gastroesophageal reflux disease) 8. IBS (irritable bowel syndrome) 9. Anemia-requires multiple transfusions 10. Prolonged Ileus, suspected bowel ischemia  Consults: pulmonary/intensive care, general surgery and vascular surgery, and GI   Procedure:  Procedure Performed: 1.  US guided cannulation of bilateral common femoral arteries and left brachial artery 2.  Arch and abdominal aortogram by Haley Graham. Haley Matters, MD on 07/24/2016.  Dr. Trula Graham and Dr. Donzetta Graham performed the following on 08/06/2016: #1: Endovascular repair of descending thoracic aorta                         #2: Distal extension 1                         #3: Open right common femoral artery exposure                         #4: Bovine pericardial patch angioplasty, right common femoral artery                         #5: Ultrasound-guided access, left brachial artery                         #6: Thoracic aortic angiogram                         #7: Abdominal aortogram                         #8: Intravascular ultrasound (IVUS), right external iliac, common iliac, abdominal aorta, thoracic aorta                         #9: Application of a right groin Provena wound VAC  History of Presenting Illness: 61 year old Caucasian female smoker transferred from outside hospital with diagnosis of acute Stanford type B aortic dissection originating at the proximal descending thoracic  aorta and extending to the aortic iliac bifurcation. The patient developed severe sudden chest and abdominal pain at 7 AM. She was just resting in bed. She has history of hypertension but had been taking her medications-Toprol-XL 25 mg. She had some associated nausea . Because of the severity of the symptoms, she presented to outside ED at [facility] where CTA was performed demonstrating the aortic dissection.  The aorta is not abnormally enlarged. The ascending aorta and arch is uninvolved. The dissection extends through the abdominal aorta and there is decreased perfusion to the lower portion of the left kidney. There appears to be adequate perfusion of the mesenteric vessels and right renal vessels.  The patient has no acute neurologic symptoms. The patient smokes one pack per day.  The patient has had preceding complaints  of abdominal pain and nausea with eating almost any meal. She had undergone recent colonoscopy and biopsy of benign polyp She had not undergone recent upper endoscopy. She denies hematemesis or hematochezia. She denies alcohol intake She denies weight loss despite having difficulty keeping her food down.  She was admitted for strict blood pressure control and management of pain.  Brief Hospital Course:  Patient had art line placed on 05/19 for closer monitoring of hemodynamics. She was initially on bi pap but was later put to a nasal cannula. Her chest pain did resolve. Her blood pressure was initially controlled with Nitroprusside and Esmolol. She did develop lower abdominal pain. KUB was obtained and she was found to have gaseous distention of the stomach and loops of air filled bowel. She was NPO and an NG tube was placed. Consults were obtained with vascular and general surgery. Vascular surgery felt she did not have any correctable mesenteric flow issues. She had totally normal flow to her celiac and SMA via the true lumen and has short segment occlusion of her  IMA. She also had normal flow to her lower extremities bilaterally. Dr. Donnetta Graham agree with bowel rest and NG suction. General surgery obtained serial abdominal films and determined removal of the NG tube as well as advancement of her diet. She had metabolic acidosis which improved with treatment.  Her blood pressure later required IV Lopressor as well as a Catapres patch. She was started on TPN because of her prolonged ileus.   She had thrombocytopenia. HIT antibody was checked and was normal. She had elevated LFTs which gradually decreased over time. She had leukocytosis with WBC as high as 36,700. Initially, she was put on Azithromycin and Clindamycin. These were stopped and she was then put on Meropenem on 05/25. Both urine and blood cultures were negative. Leukocytosis did resolve. Her last WBC was down to 10,000 on 07/27/2016. She was ambulating and walking the halls with PT for the last 2 weeks. She was having a lot of anxiety. She remained on clear liquids and had had multiple bowel movements. KUBs were obtained almost daily. Findings were consistent with small and large bowel ileus. She was restarted on both Celexa and Xanax as taken prior to admission. General surgery continued to manage the NG tube and diet. Ultimately, she had to be made NPO as her abdominal pain worsened and she had more abdominal distention.  She later had some improvement and her NG tube was removed. She had worsening anemia on 07/16/2016 and was transfused a couple of times. She then had a fever to 101.2 on 07/17/2016. CT of the abdomen and chest were done on 07/18/2016. Results showed a stable type B aortic dissection, left pleural effusion with LLL consolidation, infarcted left kidney, and stable adrenal nodules.Blood cultures were negative but she was found to have an Enterococcus Faecalis UTI and she was put on Vancomycin.  GI consult was obtained. It was agreed to continue with NPO, IV fluids, TNA, and supportive management.   She was later put on clear liquids again. Blood pressure medications were adjusted accordingly and her blood pressure then remained well controlled.   She then had fever with leukocytosis again. It was felt she had possible PNA and was put on Meropenem. This was continued for 7 days then stopped.  Unfortunately, she did not improve with conservative management. She was taken to the OR by Dr. Donzetta Graham on 07/24/2016 to undergo an [place] US guided cannulation of bilateral common femoral arteries and left  brachial artery and arch and abdominal aortogram. Findings showed both common femoral arteries were percutaneously cannulated in the false lumen. The brachial artery was cannulated in the true lumen and arch angiogram demonstrated the tear to be approximately 1-1/2 cm distal to the left subclavian artery. Vessel arteries are perfused as well as bilateral common iliac arteries, although the visceral vessels appeared to be quite diminutive. TEVAR was aborted due to inability to access the true lumen from below. She has had persistent nausea and loose stools. She has remained in the ICU since her admission.  Because she did not improve with medical management, Dr. Trula Graham and Dr. Donzetta Graham performed an endovascular repair of the descending thoracic aorta, Bovine pericardial patch angioplasty of the RCF artery, application of a right groin Provena ound VAC on 08/06/2016. Her distal pulses remained intact. She was continued on TNA but this was gradually stopped as her diet was advanced. She did receive tube feedings and supplements as well as had calorie counts. Her tube feedings were then continued at night only. She had intermittent loose stools. C Dif remained negative. She had burning on urination. UA and culture were negative on on 07/07. Nightly tube feedings appeared to make her clinically worse. She had more diffuse abdominal pain and nausea. Although, she needed the calories, these had to be stopped. Feeding tube was  removed on 08/19/2016. She was tachycardic so Lopressor was increased to 37.5 mg bid. Foley catheter was removed on 08/19/2016. Per Dr. Prescott Gum, she will go home with [facility] arrangements as she refuses LTACH  Latest Vital Signs: Blood pressure (!) 133/54, pulse (!) 107, temperature 98.1 F (36.7 C), temperature source Oral, resp. rate 19, height 5\' 3"  (1.6 m), weight 159 lb 9.8 oz (72.4 kg), SpO2 99 %.  Physical Exam: General- chronically ill appearing Heart: Slightly tachycardic Lungs: coarse to auscultation bilaterally Abdomen: Soft, distended, +  bowel sounds this am, diffusely tender(mildly) Extremities:dp pulses intact  Discharge Condition:Stable and discharged SNF.  Recent laboratory studies:  Lab Results  Component Value Date   WBC 18.5 (H) 08/22/2016   HGB 9.3 (L) 08/22/2016   HCT 28.9 (L) 08/22/2016   MCV 94.1 08/22/2016   PLT 199 08/22/2016   Lab Results  Component Value Date   NA 131 (L) 08/18/2016   K 3.8 08/18/2016   CL 101 08/18/2016   CO2 24 08/18/2016   CREATININE 1.10 (H) 08/20/2016   GLUCOSE 132 (H) 08/18/2016    Diagnostic Studies:  CLINICAL DATA:  Pneumonia.  Follow-up exam.  EXAM: PORTABLE CHEST 1 VIEW  COMPARISON:  08/10/2016  FINDINGS: Cardiac silhouette is normal size. Aortic stent is stable. No convincing mediastinal or hilar masses.  Lung base opacity is similar to the prior study allowing for differences in patient positioning. This is most likely atelectasis. Remainder of the lungs is clear. No pulmonary edema.  Suspect small left pleural effusion.  No evidence of a pneumothorax.  Enteric feeding tube passes below the diaphragm and below the field of view. Left PICC is stable and well positioned.  IMPRESSION: 1. No significant change from the previous exam. 2. Persistent lung base opacity, most likely atelectasis. Suspect small left pleural effusion.   Electronically Signed   By: Lajean Manes M.D.   On: 08/15/2016  07:32  Portable KUB:  CLINICAL DATA:  Abdominal pain  EXAM: PORTABLE ABDOMEN - 1 VIEW  COMPARISON:  Two days ago  FINDINGS: Progressive diffuse gaseous distension of small and large bowel. Feeding tube with tip at  the distal duodenum. No concerning mass effect or gas collection. Cholecystectomy clips.  IMPRESSION: 1. Adynamic ileus pattern with increased gaseous distension. 2. Stable feeding tube with tip at the distal duodenum.   Electronically Signed   By: Monte Fantasia M.D.   On: 08/17/2016 12:47  Ct Abdomen Pelvis Wo Contrast  Result Date: 06/26/2016 CLINICAL DATA:  Shortness of breath EXAM: CT CHEST, ABDOMEN AND PELVIS WITHOUT CONTRAST TECHNIQUE: Multidetector CT imaging of the chest, abdomen and pelvis was performed following the standard protocol without IV contrast. COMPARISON:  06/20/2016 chest CT FINDINGS: CT CHEST FINDINGS Cardiovascular: Normal heart size. No pericardial effusion. 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 Mild haziness around the upper descending segment is likely stable. Stable maximal diameter of 33 mm at this level. No intramural hematoma. Intermittently seen displaced intimal calcification. Mediastinum/Nodes: Negative for adenopathy. Right upper extremity PICC with tip at the SVC level. Lungs/Pleura: Multi segment atelectasis, worse on the left. Small pleural effusions. There is patchy ground-glass airspace density in the bilateral lungs without gradient. Airways are clear and there is no septal thickening. Musculoskeletal: No acute or aggressive finding. CT ABDOMEN PELVIS FINDINGS Hepatobiliary: Hepatic steatosis.Cholecystectomy with normal common bile duct diameter. Pancreas: Unremarkable. Spleen: Unremarkable. 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 The larger measures up to 27 mm and is indeterminate by densitometry, but left adrenal mass has been  noted since at least 2006 abdominal CT report, images not available. Left renal cyst. No hydronephrosis or urolithiasis. 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 Negative decompressed urinary bladder. Stomach/Bowel: There is diffuse small bowel distention and fluid filling with mildly hazy mesenteries. Similar features in the colon proximally and at the transverse segment. No pneumatosis or perforation is noted. Nasogastric tube tip is at the pylorus. Vascular/Lymphatic: There is intimal flap displacement from known dissection, morphology appearing similar to prior. No mass or adenopathy. Reproductive:Hysterectomy.  Unremarkable ovaries. Other: No ascites or pneumoperitoneum.  Anasarca. Musculoskeletal: No acute abnormalities. These results were called by telephone at the time of interpretation on 06/26/2016 at 2:05 pm to Dr. Ivin Poot , who verbally acknowledged these results. IMPRESSION: 1. Diffuse airspace disease. Pattern can be seen with noncardiogenic edema (ARDS), inflammatory pneumonitis, or atypical infection. Multi segment atelectasis at the bases and small pleural effusions. 2. Diffuse small bowel and colonic distention with fluid levels as seen with ileus. Question underlying bowel ischemia given the patient's known aortic dissection with proximal IMA occlusion and SMA flow via the narrow true lumen. 3. Incidental findings noted above. Electronically Signed   By: Monte Fantasia M.D.   On: 06/26/2016 14:05   Ct Chest Wo Contrast  Result Date: 06/26/2016 CLINICAL DATA:  Shortness of breath EXAM: CT CHEST, ABDOMEN AND PELVIS WITHOUT CONTRAST TECHNIQUE: Multidetector CT imaging of the chest, abdomen and pelvis was performed following the standard protocol without IV contrast. COMPARISON:  06/20/2016 chest CT FINDINGS: CT CHEST FINDINGS Cardiovascular: Normal heart size. No pericardial effusion. 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 Mild  haziness around the upper descending segment is likely stable. Stable maximal diameter of 33 mm at this level. No intramural hematoma. Intermittently seen displaced intimal calcification. Mediastinum/Nodes: Negative for adenopathy. Right upper extremity PICC with tip at the SVC level. Lungs/Pleura: Multi segment atelectasis, worse on the left. Small pleural effusions. There is patchy ground-glass airspace density in the bilateral lungs without gradient. Airways are  clear and there is no septal thickening. Musculoskeletal: No acute or aggressive finding. CT ABDOMEN PELVIS FINDINGS Hepatobiliary: Hepatic steatosis.Cholecystectomy with normal common bile duct diameter. Pancreas: Unremarkable. Spleen: Unremarkable. 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 The larger measures up to 27 mm and is indeterminate by densitometry, but left adrenal mass has been noted since at least 2006 abdominal CT report, images not available. Left renal cyst. No hydronephrosis or urolithiasis. 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 Negative decompressed urinary bladder. Stomach/Bowel: There is diffuse small bowel distention and fluid filling with mildly hazy mesenteries. Similar features in the colon proximally and at the transverse segment. No pneumatosis or perforation is noted. Nasogastric tube tip is at the pylorus. Vascular/Lymphatic: There is intimal flap displacement from known dissection, morphology appearing similar to prior. No mass or adenopathy. Reproductive:Hysterectomy.  Unremarkable ovaries. Other: No ascites or pneumoperitoneum.  Anasarca. Musculoskeletal: No acute abnormalities. These results were called by telephone at the time of interpretation on 06/26/2016 at 2:05 pm to Dr. Ivin Poot , who verbally acknowledged these results. IMPRESSION: 1. Diffuse airspace disease. Pattern can be seen with noncardiogenic edema  (ARDS), inflammatory pneumonitis, or atypical infection. Multi segment atelectasis at the bases and small pleural effusions. 2. Diffuse small bowel and colonic distention with fluid levels as seen with ileus. Question underlying bowel ischemia given the patient's known aortic dissection with proximal IMA occlusion and SMA flow via the narrow true lumen. 3. Incidental findings noted above. Electronically Signed   By: Monte Fantasia M.D.   On: 06/26/2016 14:05   Ct Abdomen Pelvis W Contrast  Result Date: 07/07/2016 CLINICAL DATA:  61 year old female with abdominal pain and distension greater on the right. Subsequent encounter. EXAM: CT ABDOMEN AND PELVIS WITH CONTRAST TECHNIQUE: Multidetector CT imaging of the abdomen and pelvis was performed using the standard protocol following bolus administration of intravenous contrast. CONTRAST:  75 cc Isovue 300. COMPARISON:  07/06/2016 plain film exam. 06/27/2016 CT angiogram chest, abdomen and pelvis. 10/27/2011 FINDINGS: Lower chest: Basilar atelectasis greatest medial left lung base. Heart top-normal size with aortic/mitral valve calcification. Hepatobiliary: Mild fatty infiltration of the liver without worrisome hepatic lesion. Post cholecystectomy. Pancreas: No mass or inflammation.  No duct dilation. Spleen: No mass or enlargement. Adrenals/Urinary Tract: Prominent infarct left kidney. Left renal cyst. Nodularity adrenal glands greater on the left has changed slightly since 2013 with largest left adrenal nodule currently measuring 2.6 x 2.5 x 1.9 cm versus on 2013 exam 2.4 x 2.3 x 1.9 cm Stomach/Bowel: Fluid and gas-filled prominent size small bowel without point of transition noted. Fluid-filled featureless colon. Minimal amount of fluid adjacent to small bowel loops and minimal hazy infiltration of fat planes surrounding the descending colon. No pneumatosis or free intraperitoneal air. Findings may reflect changes of enterocolitis of indeterminate etiology whether  ischemic, inflammatory or infectious. No pneumatosis or free intraperitoneal air. 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 Dissection extends into iliac arteries. Inferior mesenteric artery may fill in a retrograde fashion. Appearance unchanged from recent CT angiogram. Scattered normal size lymph nodes. Reproductive: No worrisome abnormality. Foley catheter with decompressed urinary bladder. Other: No bowel containing hernia. Musculoskeletal: No acute osseous abnormality. IMPRESSION: Fluid and gas-filled prominent size small bowel without worrisome point of transition. Fluid-filled featureless colon. Minimal amount of fluid adjacent to small bowel loops and minimal hazy infiltration of  fat planes surrounding the descending colon. No pneumatosis or free intraperitoneal air. Findings may reflect changes of enterocolitis of indeterminate etiology whether ischemic, inflammatory or infectious. No pneumatosis or free intraperitoneal air. 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 Infarction majority of the left kidney. Adrenal gland nodularity greater on left stable since recent exams and minimally changed since 2013 as detailed above. Electronically Signed   By: Genia Del M.D.   On: 07/07/2016 14:22   Ct Angio Chest/abd/pel For Dissection W And/or W/wo  Result Date: 06/27/2016 CLINICAL DATA:  61 year old female with a history of type B dissection EXAM: CT ANGIOGRAPHY CHEST, ABDOMEN AND PELVIS TECHNIQUE: Multidetector CT imaging through the chest, abdomen and pelvis was performed using the standard protocol during bolus administration of intravenous contrast. Multiplanar reconstructed images and MIPs were obtained and reviewed to evaluate the vascular anatomy. CONTRAST:  100 cc Isovue 370 COMPARISON:  CT 06/20/2016 FINDINGS: CTA  CHEST FINDINGS Cardiovascular: Heart: Heart size unchanged. No pericardial fluid/ thickening. No significant coronary calcifications. Aorta: Re- demonstration of type B dissection with the entry tear appearing to originate just beyond the origin of the left subclavian artery. Branch vessels remain patent without extension of the dissection flap into the branch vessels. Caliber and contour of the ascending aorta unremarkable without evidence of retrograde extension. Greatest diameter of the ascending aorta 2.9 cm. Inflammatory changes surrounding the distal aortic arch in the proximal descending aorta. Greatest diameter of the distal aortic arch measures approximately 3.4 cm. Greatest diameter on the comparison CT approximately 3.0 cm. No aneurysm of the descending thoracic aorta with the greatest diameter of the false lumen measuring 2.7 cm. True lumen is compressed. There is a fenestration in the distal true lumen at the level of the diaphragm just above the aortic hiatus. Pulmonary arteries: No lobar, segmental, or proximal subsegmental filling defects. Mediastinum/Nodes: No mediastinal hemorrhage. Mediastinal lymph nodes are present. Gastric tube within the esophagus. Lungs/Pleura: Ground-glass opacities developing through the the bilateral lungs. Developing interlobular septal thickening. Atelectasis of the medial segment left lower lobe. Small low-density left pleural effusion trace right-sided pleural effusion and associated atelectasis. No pneumothorax. Right upper extremity PICC appears to terminate superior vena cava. Review of the MIP images confirms the above findings. CTA ABDOMEN AND PELVIS FINDINGS VASCULAR Aorta: Re- demonstration of dissection flap of the abdominal aorta. No aneurysm.  No periaortic fluid of the abdomen. The true lumen is compressed throughout the abdomen. True lumen contributes to celiac artery origin, superior mesenteric artery origin, and also continues across the origin of the  inferior mesenteric artery. The false lumen contributes to perfusion of the right renal artery. The lateral margin of the dissection flap involves the origin of 2 left renal arteries both superior and inferior. True lumen is decompressed in the inferior aorta extending into the bilateral iliac arteries. The bilateral common iliac arteries appear perfused from the false lumen bilaterally. False lumen extends in the bilateral external iliac artery. Likely re- entry tear of distal external iliac arteries bilaterally, on the right image 173, on the left image 171. Dissection flap extends into the bilateral hypogastric arteries which are partially patent with decreased flow. Celiac: Origin of the celiac artery originates from the true lumen which is decompressed. Celiac artery remains patent at this time including the branch vessels. Typical branch pattern of splenic artery, left gastric artery, common hepatic artery. SMA: Superior mesenteric artery origin originates from the true  lumen which is compressed. SMA remains patent at this time. Renals: Right renal artery originates from the false lumen. Right renal artery is perfusing at this time with uniform perfusion of the right kidney. There are 2 left renal arteries, superior and inferior. Both arteries originate near the margin of the dissection flap. The superior left renal artery is partially filling, at the in flexion point of the dissection flap, perfusing superior kidney. The inferior left renal artery appears thrombosed, new from the comparison with enlarging left renal infarction, predominantly of the lower pole. IMA: Origin of the inferior mesenteric artery is thrombosed given the collapsed true lumen at this level. There is re- constitution of the distal inferior mesenteric artery via collateral flow. Right lower extremity: False lumen perfuses the right common iliac artery with collapse of the true lumen. Dissection flap extends into the hypogastric artery,  with the distal pelvic branch is partially opacifying. External iliac artery is perfusing from the false lumen, with unremarkable appearance of the distal external iliac artery, common femoral artery, profunda femoris, SFA. There is the appearance of a re- entry tear in the mid right external iliac artery. Left lower extremity: False lumen perfuses the left common iliac artery with collapse of the true lumen. Dissection extends into the hypogastric artery which is partially perfused with opacification of pelvic vessels. Distal external iliac artery an the common femoral artery unremarkable, with patent proximal femoral vessels. There is the appearance of a re- entry tear in the mid left external iliac artery. Veins: Unremarkable appearance of the venous system. Review of the MIP images confirms the above findings. NON-VASCULAR Hepatobiliary: Unremarkable appearance of the liver. Cholecystectomy Pancreas: Unremarkable appearance of the pancreas. No pericholecystic fluid or inflammatory changes. Unremarkable ductal system. Spleen: Unremarkable. Adrenals/Urinary Tract: Right adrenal gland unremarkable. Nodule of the left adrenal gland which measures 2.7 cm. Right: Right-sided kidney perfuses uniformly with no hydronephrosis. Left: Progressive left renal infarct with small portion of the superior kidney perfusing. The remainder of the left kidney is hypoperfused, progressed from the comparison. No hydronephrosis. Urinary catheter within the urinary bladder. Stomach/Bowel: Unremarkable appearance of stomach with gastric tube in place. Small bowel borderline dilated and fluid-filled. No transition point. The wall of the small bowel relatively uniformly thin and enhancing, although the study is timed for the arterial phase. No focal wall thickening. No abnormally distended colon. No transition point. Fluid filled colon. No focal wall thickening or pericolonic inflammatory changes. Lymphatic: Multiple lymph nodes in the  para-aortic nodal station, none of which are enlarged. Mesenteric: No free fluid or air. No adenopathy. Reproductive: Hysterectomy Other: No hernia. Musculoskeletal: No displaced fracture. Degenerative changes of the spine. 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 Only 1 possible fenestration is identified, in the distal thoracic aorta above the aortic hiatus. There is enlarging diameter of the distal aortic arch with associated inflammatory changes, measuring approximately 3.5 on today's study compared to approximately 3.1 cm previously. The appearance is concerning for progression/expansion of intramural hematoma. Progressing left renal infarction, with near complete thrombosis of superior and inferior renal arteries, both of which originate at the margin of the dissection flap. Bilateral common iliac arteries appear to be perfused from the false lumen, with complete collapse of true lumen proximally. There does appear to be re- entry to the true lumen in the mid external iliac artery bilaterally, as there is unremarkable appearance of the proximal femoral vasculature including bilateral common iliac arteries. The  3 mesenteric vessels originate from the small true lumen, which is progressively compressed. Celiac artery and superior mesenteric artery remain patent, with occlusion at the origin of the inferior mesenteric artery. Three vessel arch, with all 3 branches remain patent. No evidence of extension of the dissection flap into the branch vessels. These preliminary results were discussed by telephone at the time of interpretation on 06/27/2016 at 12:41 pm with Dr. Curt Jews. Borderline dilated small bowel without transition point or obstruction. Findings may represent ileus and/or nonspecific enteritis. No focal wall thickening to suggest early ischemia, although the timing of this CTA is specific for arterial evaluation and not  the bowel tissues. If ongoing concern for bowel ischemia, would repeat abd/pelvis contrast enhanced-CT portal venous phase. Similar appearance of mixed geographic ground-glass opacities of the bilateral lungs with mild interlobular septal thickening. Again, differential diagnosis includes pulmonary edema, ARDS, atypical infection. Signed, Dulcy Fanny. Earleen Newport, DO [facility] Electronically Signed   By: Corrie Mckusick D.O.   On: 06/27/2016 13:18   Ct Angio Chest/abd/pel For Dissection W And/or W/wo  Result Date: 06/20/2016 CLINICAL DATA:  Substernal chest pain radiating to back EXAM: CT ANGIOGRAPHY CHEST, ABDOMEN AND PELVIS TECHNIQUE: Multidetector CT imaging through the chest, abdomen and pelvis was performed using the standard protocol during bolus administration of intravenous contrast. Multiplanar reconstructed images and MIPs were obtained and reviewed to evaluate the vascular anatomy. CONTRAST:  100 cc Isovue 370 IV COMPARISON:  CT abdomen and pelvis 10/27/2011 FINDINGS: CTA CHEST FINDINGS Cardiovascular: There is the type B aortic dissection beginning in the distal aortic arch just beyond the origin of the great vessels. The true lumen is compressed by the false lumen. No aneurysm. No pulmonary embolus. Heart is normal size. Mediastinum/Nodes: No mediastinal, hilar, or axillary adenopathy. Lungs/Pleura: Atelectasis or scarring in the lingula. Lungs otherwise clear. No effusions. Musculoskeletal: No acute bony abnormality. Review of the MIP images confirms the above findings. CTA ABDOMEN AND PELVIS FINDINGS VASCULAR Aorta: Dissection continues throughout the abdominal aorta with the celiac artery and superior mesenteric artery arising from the true lumen anteriorly. The dissection may extend into the left renal artery where there is only a small amount of blood flow noted in the proximal and mid left renal artery. Right renal artery appears to arise from  the false lumen and is patent. The the true lumen appears thrombosed below the origin of the superior mesenteric artery. Inferior mesenteric artery arises from the thrombosed true lumen and appears occluded proximally, reconstitutes several cm from the origin. Celiac: Patent. SMA: Patent. Renals: As above. IMA: As above. Inflow: To the dissection continues into both common iliac arteries with thrombosed true lumens. The dissection appears to terminate at approximately the level of the common iliac bifurcation bilaterally. Veins: Grossly patent and unremarkable. Review of the MIP images confirms the above findings. NON-VASCULAR Hepatobiliary: Mild diffuse fatty infiltration. Prior cholecystectomy. Pancreas: No focal abnormality or ductal dilatation. Spleen: No focal abnormality.  Normal size. Adrenals/Urinary Tract: Nodules in the left adrenal gland are low-density on the precontrast imaging compatible with small adenomas. There are areas of non perfusion noted in the mid and lower poles of the left kidney compatible with infarction, likely related to the involvement of the left renal artery by dissection. Urinary bladder unremarkable. Stomach/Bowel: Stomach, large and small bowel grossly unremarkable. Lymphatic: No adenopathy. Reproductive: Prior hysterectomy.  No adnexal masses. Other: No free fluid or free air. Musculoskeletal: No acute bony abnormality. Review of the MIP  images confirms the above findings. 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 This involves the descending thoracic aorta, abdominal aorta and iliac vessels. The true lumen is compressed by the larger false lumen, and is thrombosed below the SMA/renal artery origins. The dissection appears to extend into the left renal artery with partial occlusion and areas of infarct in the mid and lower pole of the left kidney. Fatty infiltration of the liver. Critical Value/emergent  results were called by telephone at the time of interpretation on 06/20/2016 at 10:13 am to Dr. Fredia Sorrow , who verbally acknowledged these results. Electronically Signed   By: Rolm Baptise M.D.   On: 06/20/2016 10:15    Discharge Medications: Allergies as of 08/22/2016      Reactions   Penicillins Rash, Other (See Comments)   PATIENT HAS HAD A PCN REACTION WITH IMMEDIATE RASH, FACIAL/TONGUE/THROAT SWELLING, SOB, OR LIGHTHEADEDNESS WITH HYPOTENSION:  #  #  #  YES  #  #  #   Has patient had a PCN reaction causing severe rash involving mucus membranes or skin necrosis: no Has patient had a PCN reaction that required hospitalization no Has patient had a PCN reaction occurring within the last 10 years:no If all of the above answers are "NO", then may proceed with Cephalosporin use.   Ativan [lorazepam] Other (See Comments)   Makes pt very confused and more agitated, irritable.   Codeine Nausea And Vomiting   Reglan [metoclopramide] Other (See Comments)   TACHYCARDIA      Medication List    STOP taking these medications   albuterol (2.5 MG/3ML) 0.083% nebulizer solution Commonly known as:  PROVENTIL   dicyclomine 10 MG capsule Commonly known as:  BENTYL   ondansetron 4 MG tablet Commonly known as:  ZOFRAN   rosuvastatin 10 MG tablet Commonly known as:  CRESTOR   VENTOLIN HFA 108 (90 Base) MCG/ACT inhaler Generic drug:  albuterol     TAKE these medications   ALPRAZolam 0.5 MG tablet Commonly known as:  XANAX Take 0.5-1 tablets (0.25-0.5 mg total) by mouth 2 (two) times daily as needed for anxiety. What changed:  when to take this  reasons to take this   aspirin EC 81 MG tablet Take 324 mg by mouth once. What changed:  Another medication with the same name was added. Make sure you understand how and when to take each.   aspirin 81 MG EC tablet Take 1 tablet (81 mg total) by mouth daily. What changed:  You were already taking a medication with the same name, and this  prescription was added. Make sure you understand how and when to take each.   citalopram 40 MG tablet Commonly known as:  CELEXA Take 40 mg by mouth at bedtime.   clonazePAM 1 MG tablet Commonly known as:  KLONOPIN Take 1 tablet (1 mg total) by mouth at bedtime.   fluticasone 50 MCG/ACT nasal spray Commonly known as:  FLONASE Place 2 sprays into both nostrils daily as needed for allergies or rhinitis.   glipiZIDE-metformin 5-500 MG tablet Commonly known as:  METAGLIP Take 0.5 tablets by mouth 2 (two) times daily before a meal.   Metoprolol Tartrate 37.5 MG Tabs Take 37.5 mg by mouth 2 (two) times daily. What changed:  medication strength  how much to take  when to take this   mometasone-formoterol 200-5 MCG/ACT Aero Commonly known as:  DULERA Inhale 2 puffs into the lungs  2 (two) times daily.   Oxycodone HCl 10 MG Tabs Take 1 tablet (10 mg total) by mouth 2 (two) times daily as needed for severe pain.   pantoprazole 40 MG tablet Commonly known as:  PROTONIX Take 40 mg by mouth daily as needed (acid reflux/ indigestion).   PROBIOTIC PO Take 1 tablet by mouth at bedtime. Gut Restore Ultimate (probiotic with zinc and vitamin C)       Signed: Verlie Liotta EPA-C 08/22/2016, 8:01 AM

## 2016-08-12 NOTE — Progress Notes (Addendum)
[facility] DAILY ICU PROGRESS NOTE                   [address]            [place] [address]          [phone]   6 Days Post-Op Procedure(s) (LRB): THORACIC AORTIC ENDOVASCULAR STENT GRAFT/Thorasic and Abdominal Angiogram, Entravascular ultrasound., Open femoral exposure,Left Brachial access, and patch angioplasty right femoral artery. (N/A)  Total Length of Stay:  LOS: 53 days   Subjective: Patient going for a walk this am. She states her belly pain the last few nights was horrendous. She feels crampy-like abdominal pain this am. Denies nausea or vomiting. Has had loose stools  Objective: Vital signs in last 24 hours: Temp:  [98.2 F (36.8 C)-99.5 F (37.5 C)] 98.6 F (37 C) (07/11 0350) Pulse Rate:  [88-107] 101 (07/11 0715) Cardiac Rhythm: Normal sinus rhythm (07/11 0715) Resp:  [14-31] 23 (07/11 0300) BP: (119-155)/(46-77) 141/77 (07/11 0600) SpO2:  [90 %-98 %] 98 % (07/11 0715)  Filed Weights   08/09/16 0600 08/10/16 0530 08/11/16 0600  Weight: 79.3 kg (174 lb 13.2 oz) 78.5 kg (173 lb 1 oz) 82.7 kg (182 lb 5.1 oz)      Intake/Output from previous day: 07/10 0701 - 07/11 0700 In: 1672 [P.O.:600; I.V.:430; NG/GT:442; IV Piggyback:200] Out: 2600 [Urine:2600]  Intake/Output this shift: Total I/O In: 98 [NG/GT:98] Out: -   Current Meds: Scheduled Meds: . arformoterol  15 mcg Nebulization BID  . budesonide (PULMICORT) nebulizer solution  0.5 mg Nebulization BID  . Chlorhexidine Gluconate Cloth  6 each Topical Daily  . citalopram  40 mg Oral Daily  . clonazePAM  1 mg Oral QHS  . docusate sodium  100 mg Oral Daily  . enoxaparin (LOVENOX) injection  40 mg Subcutaneous Q24H  . feeding supplement (ENSURE ENLIVE)  237 mL Oral BID BM  . feeding supplement (PRO-STAT SUGAR FREE 64)  30 mL Per Tube TID  . feeding supplement (VITAL 1.5 CAL)  1,000 mL Per Tube Q24H  . furosemide  40 mg Intravenous Daily  . insulin aspart  0-24 Units Subcutaneous Q6H  .  insulin glargine  10 Units Subcutaneous BID  . magic mouthwash  5 mL Oral QID  . metoprolol tartrate  25 mg Oral BID  . pantoprazole  40 mg Oral Daily  . sodium chloride flush  10-40 mL Intracatheter Q12H   Continuous Infusions: . sodium chloride 10 mL/hr at 08/11/16 0950  . lactated ringers Stopped (08/07/16 0900)  . magnesium sulfate 1 - 4 g bolus IVPB     PRN Meds:.acetaminophen **OR** acetaminophen, ALPRAZolam, alum & mag hydroxide-simeth, Gerhardt's butt cream, guaiFENesin-dextromethorphan, HYDROmorphone (DILAUDID) injection, labetalol, magnesium sulfate 1 - 4 g bolus IVPB, metoprolol tartrate, ondansetron (ZOFRAN) IV, phenol, pneumococcal 23 valent vaccine, simethicone, sodium chloride flush  Heart: RRR Lungs: Clear to auscultation bilaterally Abdomen: Soft, no real tenderness with palpation, bowel sounds present. Extremities: Warm, pulses intact.   Lab Results: CBC:  Recent Labs  08/10/16 0453 08/11/16 0409  WBC 17.1* 17.0*  HGB 9.2* 9.2*  HCT 28.7* 29.1*  PLT 164 154   BMET:   Recent Labs  08/10/16 0453 08/11/16 0409  NA 128* 129*  K 3.5 4.2  CL 98* 99*  CO2 25 23  GLUCOSE 96 130*  BUN 26* 25*  CREATININE 0.87 0.89  CALCIUM 7.7* 7.8*    CMET: Lab Results  Component Value Date   WBC 17.0 (  H) 08/11/2016   HGB 9.2 (L) 08/11/2016   HCT 29.1 (L) 08/11/2016   PLT 154 08/11/2016   GLUCOSE 130 (H) 08/11/2016   TRIG 159 (H) 08/10/2016   ALT 12 (L) 08/10/2016   AST 14 (L) 08/10/2016   NA 129 (L) 08/11/2016   K 4.2 08/11/2016   CL 99 (L) 08/11/2016   CREATININE 0.89 08/11/2016   BUN 25 (H) 08/11/2016   CO2 23 08/11/2016   INR 1.24 08/06/2016      PT/INR:  No results for input(s): LABPROT, INR in the last 72 hours. Radiology: No results found.   Assessment/Plan: 1. CV-SR in the 90's. SBP well controlled as 130's or less. Continue Lopressor 25 mg bid. 2. Pulmonary-On room air. Continue Pulmicort and Brovana nebs. 3. GI- Continue TPN, boost, vital  supplements, calorie counts. Weand off TPN yesterday, on nightly TFs. Will order KUB for am. 4. Edema secondary to low albumin. On Lasix 40 mg IV daily but will decrease to 20 mg IV daily. 5. DM-CBGs 114/153/143. On Insulin. Will consider restarting Glipizide once tolerating oral better. 6. Anemia-H and H stable yesterday at 9.2 and 28.7 . She is NOT on ecasa or Lovenox. 7. Leukocytosis-Last WBC decreased to 17,100. 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 Recheck in am 8. Hyponatremia-sodium 129 and is likely related to diuresis. Recheck in am 9. Await C dif  ZIMMERMAN,DONIELLE M PA-C 08/12/2016 8:28 AM   Abdominal exam benign Encourage oral intake and mobility- cont nocturnal TF KUB in am Decrease lasix now that TNA off patient examined and medical record reviewed,agree with above note. [name] 08/12/2016

## 2016-08-12 NOTE — Evaluation (Signed)
Occupational Therapy Evaluation Patient Details Name: Haley Graham MRN: [id number] DOB: 25-Oct-1955 Today's Date: 08/12/2016    History of Present Illness Pt adm with type B aortic dissection. Treated non surgically with BP control and pain control. Pt developed pancreatitis and ileus with NG tube placed. TEVAR procedure 08/06/16. PMH - anxiety, arthritis   Clinical Impression   Pt admitted with above. She demonstrates the below listed deficits and will benefit from continued OT to maximize safety and independence with BADLs.  Pt presents to OT with generalized weakness, decreased activity tolerance, pain, and anxiety with possible cognitive deficits.  She requires max A for ADLs and minA for functional mobility.  Will follow acutely.        Follow Up Recommendations  Home health OT;Supervision/Assistance - 24 hour    Equipment Recommendations  3 in 1 bedside commode;Tub/shower bench    Recommendations for Other Services       Precautions / Restrictions Precautions Precautions: Fall Restrictions Weight Bearing Restrictions: No      Mobility Bed Mobility Overal bed mobility: Needs Assistance Bed Mobility: Supine to Sit;Sit to Sidelying Rolling: Supervision Sidelying to sit: Min assist     Sit to sidelying: Min guard General bed mobility comments: assist to initiate activity   Transfers Overall transfer level: Needs assistance Equipment used: Rolling walker (2 wheeled) Transfers: Sit to/from [facility] Sit to Stand: Min guard Stand pivot transfers: Min assist       General transfer comment: min A for balance     Balance Overall balance assessment: Needs assistance Sitting-balance support: Feet supported Sitting balance-Leahy Scale: Fair     Standing balance support: Single extremity supported Standing balance-Leahy Scale: Poor Standing balance comment: requires UE support                            ADL either performed or  assessed with clinical judgement   ADL Overall ADL's : Needs assistance/impaired Eating/Feeding: Moderate assistance;Bed level Eating/Feeding Details (indicate cue type and reason): due to poor initiation  Grooming: Wash/dry hands;Wash/dry face;Oral care;Brushing hair;Minimal assistance;Sitting   Upper Body Bathing: Maximal assistance;Sitting   Lower Body Bathing: Maximal assistance;Sit to/from stand   Upper Body Dressing : Maximal assistance;Sitting   Lower Body Dressing: Total assistance;Sit to/from stand   Toilet Transfer: Minimal assistance;Ambulation;Comfort height toilet;RW   Toileting- Clothing Manipulation and Hygiene: Maximal assistance;Sit to/from stand       Functional mobility during ADLs: Minimal assistance;Rolling walker General ADL Comments: Pt with became panicky with entry into bathroom.  She demonstrated increased RR, difficulty focusing, and impaired balance. She stated she felt closed in and unable to stay in bathroom      Vision         Perception     Praxis      Pertinent Vitals/Pain Pain Assessment: Faces Faces Pain Scale: Hurts whole lot Pain Location: back and abdomen Pain Descriptors / Indicators: Grimacing;Guarding Pain Intervention(s): Limited activity within patient's tolerance;Monitored during session;Premedicated before session     Hand Dominance Right   Extremity/Trunk Assessment Upper Extremity Assessment Upper Extremity Assessment: Generalized weakness   Lower Extremity Assessment Lower Extremity Assessment: Defer to PT evaluation   Cervical / Trunk Assessment Cervical / Trunk Assessment: Kyphotic   Communication Communication Communication: No difficulties   Cognition Arousal/Alertness: Awake/alert Behavior During Therapy: Anxious;WFL for tasks assessed/performed Overall Cognitive Status: Impaired/Different from baseline Area of Impairment: Attention;Following commands;Safety/judgement;Problem solving  Current Attention Level: Sustained;Selective   Following Commands: Follows one step commands consistently;Follows multi-step commands inconsistently Safety/Judgement: Decreased awareness of deficits;Decreased awareness of safety Awareness: Intellectual Problem Solving: Slow processing;Difficulty sequencing;Requires verbal cues;Requires tactile cues General Comments: Pt demonstrates profound anxiety at times.  She demonstrates intermittent confusion    General Comments  VSS throughout.  Discussed goals extensively with pt and discussed with her what she needs to be able to do for self before going home     Exercises     Shoulder Instructions      Home Living Family/patient expects to be discharged to:: Private residence Living Arrangements: Spouse/significant other Available Help at Discharge: Family Type of Home: Mobile home Home Access: Stairs to enter Entrance Stairs-Number of Steps: 6 Entrance Stairs-Rails: Right Home Layout: One level     Bathroom Shower/Tub: Teacher, early years/pre: Standard     Home Equipment: None          Prior Functioning/Environment Level of Independence: Independent        Comments: Pt watched her 16 y.o. grandson, including taking to school and picking him up.  Independent in the community         OT Problem List: Decreased strength;Decreased activity tolerance;Impaired balance (sitting and/or standing);Decreased cognition;Decreased safety awareness;Decreased knowledge of use of DME or AE;Pain      OT Treatment/Interventions: Self-care/ADL training;Therapeutic exercise;Energy conservation;DME and/or AE instruction;Therapeutic activities;Patient/family education;Balance training    OT Goals(Current goals can be found in the care plan section) Acute Rehab OT Goals Patient Stated Goal: to take care of grandson  OT Goal Formulation: With patient/family Time For Goal Achievement: 08/26/16 Potential to Achieve Goals:  Good ADL Goals Pt Will Perform Grooming: with min guard assist;standing Pt Will Perform Upper Body Bathing: with set-up;sitting Pt Will Perform Lower Body Bathing: with min guard assist;sit to/from stand Pt Will Perform Upper Body Dressing: with set-up;sitting Pt Will Perform Lower Body Dressing: with min guard assist;sit to/from stand Pt Will Transfer to Toilet: with min guard assist;ambulating;regular height toilet;bedside commode;grab bars Pt Will Perform Toileting - Clothing Manipulation and hygiene: with min guard assist;sit to/from stand  OT Frequency: Min 2X/week   Barriers to D/C:            Co-evaluation              AM-PAC PT "6 Clicks" Daily Activity     Outcome Measure Help from another person eating meals?: A Lot Help from another person taking care of personal grooming?: A Little Help from another person toileting, which includes using toliet, bedpan, or urinal?: A Lot Help from another person bathing (including washing, rinsing, drying)?: A Lot Help from another person to put on and taking off regular upper body clothing?: A Lot Help from another person to put on and taking off regular lower body clothing?: Total 6 Click Score: 12   End of Session Equipment Utilized During Treatment: Gait belt;Rolling walker Nurse Communication: Mobility status  Activity Tolerance: Patient tolerated treatment well Patient left: in bed;with call bell/phone within reach;with family/visitor present  OT Visit Diagnosis: Unsteadiness on feet (R26.81);Pain Pain - part of body:  (back )                Time: 3716-9678 OT Time Calculation (min): 38 min Charges:  OT General Charges $OT Visit: 1 Procedure OT Evaluation $OT Eval Moderate Complexity: 1 Procedure OT Treatments $Self Care/Home Management : 8-22 mins $Therapeutic Activity: 8-22 mins G-Codes:     Octa Uplinger, OTR/L  [phone]   Lucille Passy M 08/12/2016, 5:06 PM

## 2016-08-12 NOTE — Progress Notes (Signed)
Physical Therapy Treatment Patient Details Name: Haley Graham MRN: [id number] DOB: 30-Jun-1955 Today's Date: 08/12/2016    History of Present Illness Pt adm with type B aortic dissection. Treated non surgically with BP control and pain control. Pt developed pancreatitis and ileus with NG tube placed. TEVAR procedure 08/06/16. PMH - anxiety, arthritis    PT Comments    Patient has progressed with mobility and is more willing to participate however does continue to demonstrate self limiting behavior. Pt needs encouragement to progress--maybe because of anxiety? Therapist educated pt on need to attempt progressing from this point and working on strengthening next session.  Continue to progress as tolerated.   Follow Up Recommendations  Home health PT;Supervision/Assistance - 24 hour     Equipment Recommendations  Rolling walker with 5" wheels    Recommendations for Other Services       Precautions / Restrictions Precautions Precautions: Fall Restrictions Weight Bearing Restrictions: No    Mobility  Bed Mobility Overal bed mobility: Needs Assistance Bed Mobility: Rolling;Sidelying to Sit Rolling: Supervision Sidelying to sit: Min assist       General bed mobility comments: assist to elevate trunk into sitting; use of bed rail  Transfers Overall transfer level: Needs assistance Equipment used: Rolling walker (2 wheeled) Transfers: Sit to/from Stand Sit to Stand: Min guard         General transfer comment: cues for safe hand placement  Ambulation/Gait Ambulation/Gait assistance: Min guard Ambulation Distance (Feet): 350 Feet Assistive device: Rolling walker (2 wheeled) Gait Pattern/deviations: Step-through pattern;Decreased stride length Gait velocity: decr   General Gait Details: cues for breathing   Stairs            Wheelchair Mobility    Modified Rankin (Stroke Patients Only)       Balance Overall balance assessment: Needs  assistance Sitting-balance support: No upper extremity supported;Feet supported Sitting balance-Leahy Scale: Good     Standing balance support: During functional activity;No upper extremity supported Standing balance-Leahy Scale: Fair Standing balance comment: able to static stand without UE support                            Cognition Arousal/Alertness: Awake/alert Behavior During Therapy: Anxious;WFL for tasks assessed/performed Overall Cognitive Status: Within Functional Limits for tasks assessed                                 General Comments: self-limiting behavior      Exercises      General Comments General comments (skin integrity, edema, etc.): VSS throughout; pt on RA      Pertinent Vitals/Pain Pain Assessment: Faces Faces Pain Scale: Hurts little more Pain Location: back and abdomen Pain Descriptors / Indicators: Grimacing;Guarding Pain Intervention(s): Monitored during session;Premedicated before session;Repositioned    Home Living                      Prior Function            PT Goals (current goals can now be found in the care plan section) Progress towards PT goals: Progressing toward goals    Frequency    Min 3X/week      PT Plan Current plan remains appropriate    Co-evaluation              AM-PAC PT "6 Clicks" Daily Activity  Outcome Measure  Difficulty turning over  in bed (including adjusting bedclothes, sheets and blankets)?: A Lot Difficulty moving from lying on back to sitting on the side of the bed? : Total Difficulty sitting down on and standing up from a chair with arms (e.g., wheelchair, bedside commode, etc,.)?: A Little Help needed moving to and from a bed to chair (including a wheelchair)?: None Help needed walking in hospital room?: None Help needed climbing 3-5 steps with a railing? : A Little 6 Click Score: 17    End of Session Equipment Utilized During Treatment: Gait  belt Activity Tolerance: Patient tolerated treatment well Patient left: with call bell/phone within reach;with nursing/sitter in room;in bed Nurse Communication: Mobility status PT Visit Diagnosis: Muscle weakness (generalized) (M62.81);Other abnormalities of gait and mobility (R26.89)     Time: 6728-9791 PT Time Calculation (min) (ACUTE ONLY): 23 min  Charges:  $Gait Training: 8-22 mins $Therapeutic Activity: 8-22 mins                    G Codes:       Earney Navy, PTA Pager: [phone]     Darliss Cheney 08/12/2016, 4:48 PM

## 2016-08-12 NOTE — Progress Notes (Signed)
Progress Note  SUBJECTIVE:    POD #6  Complaining of abdominal pain that comes and goes and feels worse than pre-op. Having some nausea. Afraid to eat more as she is afraid of having more pain.   OBJECTIVE:   Vitals:   08/12/16 0638 08/12/16 0715  BP:    Pulse: (!) 101 (!) 101  Resp:    Temp:      Intake/Output Summary (Last 24 hours) at 08/12/16 0802 Last data filed at 08/12/16 0600  Gross per 24 hour  Intake             1272 ml  Output             2200 ml  Net             -928 ml   Abdomen with audible bowel sounds. Soft without distension. Mild diffuse tenderness to palpation.  Right groin with incisional vac.  2+ DP pulses bilaterally.   ASSESSMENT/PLAN:   61 y.o. female is s/p: TEVAR 6 Days Post-Op   TPN stopped yesterday. Getting nocturnal tube feeds. Tolerating this. Not eating very much due to anxiety about having abdominal pain. Advance diet as tolerated.  Abdominal pain seems worse today. Abdomen soft. Will defer any scanning to Dr. Prescott Gum. Having liquid stools. Will check c.diff.   Has been afebrile. Will check labs tomorrow.   Needs to get out of bed and ambulate today.   Alvia Grove 08/12/2016 8:02 AM -- LABS:   CBC    Component Value Date/Time   WBC 17.0 (H) 08/11/2016 0409   HGB 9.2 (L) 08/11/2016 0409   HCT 29.1 (L) 08/11/2016 0409   PLT 154 08/11/2016 0409    BMET    Component Value Date/Time   NA 129 (L) 08/11/2016 0409   K 4.2 08/11/2016 0409   CL 99 (L) 08/11/2016 0409   CO2 23 08/11/2016 0409   GLUCOSE 130 (H) 08/11/2016 0409   BUN 25 (H) 08/11/2016 0409   CREATININE 0.89 08/11/2016 0409   CALCIUM 7.8 (L) 08/11/2016 0409   GFRNONAA >60 08/11/2016 0409   GFRAA >60 08/11/2016 0409    COAG Lab Results  Component Value Date   INR 1.24 08/06/2016   INR 1.08 07/29/2016   INR 1.72 07/19/2016   No results found for: PTT  ANTIBIOTICS:   Anti-infectives    Start     Dose/Rate Route Frequency Ordered Stop   08/06/16 1400  vancomycin (VANCOCIN) IVPB 1000 mg/200 mL premix     1,000 mg 200 mL/hr over 60 Minutes Intravenous To ShortStay Surgical 08/06/16 0038 08/06/16 1814   08/04/16 1100  ceFEPIme (MAXIPIME) 1 g in dextrose 5 % 50 mL IVPB     1 g 100 mL/hr over 30 Minutes Intravenous Every 12 hours 08/04/16 1049 08/11/16 2359   07/27/16 1400  meropenem (MERREM) 1 g in sodium chloride 0.9 % 100 mL IVPB     1 g 200 mL/hr over 30 Minutes Intravenous Every 8 hours 07/27/16 0849 07/29/16 2143   07/25/16 2200  meropenem (MERREM) 1 g in sodium chloride 0.9 % 100 mL IVPB  Status:  Discontinued     1 g 200 mL/hr over 30 Minutes Intravenous Every 12 hours 07/25/16 1223 07/27/16 0849   07/23/16 1000  ceFEPIme (MAXIPIME) 1 g in dextrose 5 % 50 mL IVPB  Status:  Discontinued     1 g 100 mL/hr over 30 Minutes Intravenous Every 8 hours 07/23/16 0911 07/23/16 0177  07/23/16 1000  meropenem (MERREM) 1 g in sodium chloride 0.9 % 100 mL IVPB  Status:  Discontinued     1 g 200 mL/hr over 30 Minutes Intravenous Every 8 hours 07/23/16 0919 07/25/16 1223   07/18/16 0000  levofloxacin (LEVAQUIN) IVPB 500 mg  Status:  Discontinued     500 mg 100 mL/hr over 60 Minutes Intravenous Every 24 hours 07/17/16 0903 07/17/16 0938   07/17/16 1100  vancomycin (VANCOCIN) 1,500 mg in sodium chloride 0.9 % 500 mL IVPB     1,500 mg 250 mL/hr over 120 Minutes Intravenous Every 24 hours 07/17/16 0946 07/23/16 1221   07/17/16 1000  levofloxacin (LEVAQUIN) tablet 500 mg  Status:  Discontinued     500 mg Oral Daily 07/17/16 0851 07/17/16 0903   07/15/16 0900  ciprofloxacin (CIPRO) tablet 500 mg  Status:  Discontinued     500 mg Oral 2 times daily 07/15/16 0828 07/17/16 0851   07/14/16 1115  levofloxacin (LEVAQUIN) tablet 500 mg  Status:  Discontinued     500 mg Oral Daily 07/14/16 1107 07/14/16 1214   06/26/16 0830  meropenem (MERREM) 1 g in sodium chloride 0.9 % 100 mL IVPB  Status:  Discontinued     1 g 200 mL/hr over 30 Minutes  Intravenous Every 8 hours 06/26/16 0810 07/06/16 0804   06/22/16 1800  clindamycin (CLEOCIN) IVPB 600 mg  Status:  Discontinued     600 mg 100 mL/hr over 30 Minutes Intravenous Every 8 hours 06/22/16 1723 06/26/16 0810   06/22/16 1000  azithromycin (ZITHROMAX) 250 mg in dextrose 5 % 125 mL IVPB  Status:  Discontinued     250 mg 125 mL/hr over 60 Minutes Intravenous Every 24 hours 06/22/16 0826 06/27/16 1019   06/22/16 0900  levofloxacin (LEVAQUIN) IVPB 500 mg  Status:  Discontinued     500 mg 100 mL/hr over 60 Minutes Intravenous Every 24 hours 06/22/16 0759 06/22/16 [place], PA-C [facility] Office: [phone] Pager: [phone] 08/12/2016 8:02 AM  I have independently interviewed patient and agree with PA assessment and plan above. Encouraged oob. Abdominal exam benign despite complaints. Labs tomorrow.   Nikan Ellingson C. Donzetta Matters, MD [facility] [place] Office: [phone] Pager: [phone]

## 2016-08-12 NOTE — Progress Notes (Signed)
Surgical PA ordered to hold TF for tonight and see if pt feels better not receiving TF.

## 2016-08-13 ENCOUNTER — Encounter (HOSPITAL_COMMUNITY): Payer: Self-pay | Admitting: Surgery

## 2016-08-13 ENCOUNTER — Inpatient Hospital Stay (HOSPITAL_COMMUNITY): Payer: BLUE CROSS/BLUE SHIELD

## 2016-08-13 LAB — CBC
HCT: 28.2 % — ABNORMAL LOW (ref 36.0–46.0)
Hemoglobin: 9.2 g/dL — ABNORMAL LOW (ref 12.0–15.0)
MCH: 30.6 pg (ref 26.0–34.0)
MCHC: 32.6 g/dL (ref 30.0–36.0)
MCV: 93.7 fL (ref 78.0–100.0)
PLATELETS: 192 10*3/uL (ref 150–400)
RBC: 3.01 MIL/uL — AB (ref 3.87–5.11)
RDW: 15.9 % — ABNORMAL HIGH (ref 11.5–15.5)
WBC: 16.5 10*3/uL — ABNORMAL HIGH (ref 4.0–10.5)

## 2016-08-13 LAB — BASIC METABOLIC PANEL
Anion gap: 8 (ref 5–15)
BUN: 21 mg/dL — ABNORMAL HIGH (ref 6–20)
CHLORIDE: 101 mmol/L (ref 101–111)
CO2: 23 mmol/L (ref 22–32)
CREATININE: 0.93 mg/dL (ref 0.44–1.00)
Calcium: 7.7 mg/dL — ABNORMAL LOW (ref 8.9–10.3)
GFR calc non Af Amer: >60 mL/min (ref >60)
Glucose, Bld: 82 mg/dL (ref 65–99)
Potassium: 3.8 mmol/L (ref 3.5–5.1)
Sodium: 132 mmol/L — ABNORMAL LOW (ref 135–145)

## 2016-08-13 LAB — GLUCOSE, CAPILLARY
Glucose-Capillary: 159 mg/dL — ABNORMAL HIGH (ref 65–99)
Glucose-Capillary: 88 mg/dL (ref 65–99)
Glucose-Capillary: 100 mg/dL — ABNORMAL HIGH (ref 65–99)
Glucose-Capillary: 130 mg/dL — ABNORMAL HIGH (ref 65–99)
Glucose-Capillary: 143 mg/dL — ABNORMAL HIGH (ref 65–99)

## 2016-08-13 MED ORDER — POTASSIUM CHLORIDE CRYS ER 20 MEQ PO TBCR
20.0000 meq | EXTENDED_RELEASE_TABLET | Freq: Once | ORAL | Status: AC
  Administered 2016-08-13: 20 meq via ORAL
  Filled 2016-08-13: qty 1

## 2016-08-13 NOTE — Care Management Note (Signed)
Case Management Note Previous note created by: Marvetta Gibbons RN, BSN Unit 2W-Case Manager-- [place] coverage [phone]  Patient Details  Name: Haley Graham MRN: [id number] Date of Birth: 04/20/1955  Subjective/Objective:  Pt admitted with Stanford type B aortic dissection-- With pancreatitis and ileus after Stanford type B dissection- has NGT in place                  Action/Plan: PTA pt lived at home with spouse- referral for FMLA paperwork needed by daughter- have referred her to her mother's primary care doctor regarding this- CM to follow for d/c needs  Expected Discharge Date:                  Expected Discharge Plan:  [place]  In-House Referral:     Discharge planning Services  CM Consult  Post Acute Care Choice:  Home Health Choice offered to:     DME Arranged:    DME Agency:     HH Arranged:    [place] Agency:     Status of Service:  In process, will continue to follow  If discussed at Long Length of Stay Meetings, dates discussed:  6/5, 6/7, 6/12, 6/14, 6/19, 6/21  Discharge Disposition:   Additional Comments: 08/13/2016  Discussed in LOS 7/12 - remains appropriate for continued stay.  PT/OT both recommend [facility] - requested orders via physician sticky tab  08/12/16 Elenor Quinones, RN, BSN [phone] 5 days s/p TAEVR.  TPN was reduced to 50 ml/hr yesterday night when TF was initiated (plan is to wean pt off TPN).  Pt is also slowing progressing on liquid diet.  Recommendations currently are for [facility] and DME - orders will be written closer to discharge so arrangements can be made.  Daughter contacted CIR directly to request admittance consideration.  07/23/16- 1000- Kristi Webster RN, CM- pt continues with prolonged ileus and ischemia - on TPA and only sips of clears- plan per notes for possible thoracic aortic endovascular stent later this week.  Per PT recommendations for [facility] f/u - will need orders prior to discharge- CM will continue to follow for  d/c needs when medically ready.   07/07/16- 1245- Kristi Webster RN, CM- pt remains- NPO, TNA- per MD note- plan repeat CT abd-pelvis to assess bowel viability- CM to continue to follow.   Maryclare Labrador, RN 08/13/2016, 12:48 PM

## 2016-08-13 NOTE — Progress Notes (Addendum)
TCTS DAILY ICU PROGRESS NOTE                   [address]            [place],H[place] [address]          [phone]   7 Days Post-Op Procedure(s) (LRB): THORACIC AORTIC ENDOVASCULAR STENT GRAFT/Thorasic and Abdominal Angiogram, Entravascular ultrasound., Open femoral exposure,Left Brachial access, and patch angioplasty right femoral artery. (N/A)  Total Length of Stay:  LOS: 54 days   Subjective: Still with abdominal pain last night. She is just waking up this am and states her pain is not too bad.  Objective: Vital signs in last 24 hours: Temp:  [98.1 F (36.7 C)-99.2 F (37.3 C)] 98.4 F (36.9 C) (07/12 0734) Pulse Rate:  [86-99] 93 (07/12 0600) Cardiac Rhythm: Normal sinus rhythm (07/12 0400) Resp:  [16-29] 16 (07/12 0600) BP: (112-144)/(43-81) 121/45 (07/12 0600) SpO2:  [88 %-98 %] 98 % (07/12 0600) Weight:  [82.3 kg (181 lb 7 oz)] 82.3 kg (181 lb 7 oz) (07/12 0404)  Filed Weights   08/10/16 0530 08/11/16 0600 08/13/16 0404  Weight: 78.5 kg (173 lb 1 oz) 82.7 kg (182 lb 5.1 oz) 82.3 kg (181 lb 7 oz)      Intake/Output from previous day: 07/11 0701 - 07/12 0700 In: 648 [P.O.:280; I.V.:270; NG/GT:98] Out: 2300 [Urine:2300]  Intake/Output this shift: No intake/output data recorded.  Current Meds: Scheduled Meds: . arformoterol  15 mcg Nebulization BID  . budesonide (PULMICORT) nebulizer solution  0.5 mg Nebulization BID  . Chlorhexidine Gluconate Cloth  6 each Topical Daily  . citalopram  40 mg Oral Daily  . clonazePAM  1 mg Oral QHS  . docusate sodium  100 mg Oral Daily  . enoxaparin (LOVENOX) injection  40 mg Subcutaneous Q24H  . feeding supplement (ENSURE ENLIVE)  237 mL Oral BID BM  . feeding supplement (PRO-STAT SUGAR FREE 64)  30 mL Per Tube TID  . feeding supplement (VITAL 1.5 CAL)  1,000 mL Per Tube Q24H  . furosemide  20 mg Intravenous Daily  . insulin aspart  0-24 Units Subcutaneous Q6H  . insulin glargine  10 Units Subcutaneous BID  .  magic mouthwash  5 mL Oral QID  . metoprolol tartrate  25 mg Oral BID  . pantoprazole  40 mg Oral Daily  . sodium chloride flush  10-40 mL Intracatheter Q12H   Continuous Infusions: . sodium chloride Stopped (08/12/16 2000)  . lactated ringers Stopped (08/07/16 0900)  . magnesium sulfate 1 - 4 g bolus IVPB     PRN Meds:.acetaminophen **OR** acetaminophen, ALPRAZolam, alum & mag hydroxide-simeth, Nilesh Stegall's butt cream, guaiFENesin-dextromethorphan, HYDROmorphone (DILAUDID) injection, labetalol, magnesium sulfate 1 - 4 g bolus IVPB, metoprolol tartrate, ondansetron (ZOFRAN) IV, phenol, pneumococcal 23 valent vaccine, simethicone, sodium chloride flush  Heart: RRR Lungs: Clear to auscultation bilaterally Abdomen: Soft, high pitched tinkling bowel sounds, non tender to palpation this am Extremities: SCDs in place  Lab Results: CBC:  Recent Labs  08/11/16 0409 08/13/16 0351  WBC 17.0* 16.5*  HGB 9.2* 9.2*  HCT 29.1* 28.2*  PLT 154 192   BMET:   Recent Labs  08/11/16 0409 08/13/16 0351  NA 129* 132*  K 4.2 3.8  CL 99* 101  CO2 23 23  GLUCOSE 130* 82  BUN 25* 21*  CREATININE 0.89 0.93  CALCIUM 7.8* 7.7*    CMET: Lab Results  Component Value Date   WBC 16.5 (H) 08/13/2016  HGB 9.2 (L) 08/13/2016   HCT 28.2 (L) 08/13/2016   PLT 192 08/13/2016   GLUCOSE 82 08/13/2016   TRIG 159 (H) 08/10/2016   ALT 12 (L) 08/10/2016   AST 14 (L) 08/10/2016   NA 132 (L) 08/13/2016   K 3.8 08/13/2016   CL 101 08/13/2016   CREATININE 0.93 08/13/2016   BUN 21 (H) 08/13/2016   CO2 23 08/13/2016   INR 1.24 08/06/2016    PT/INR:  No results for input(s): LABPROT, INR in the last 72 hours. Radiology: No results found.   Assessment/Plan: 1. CV-SR in the 90's. SBP fairly well controlled. Continue Lopressor 25 mg bid. 2. Pulmonary-On room air. Continue Pulmicort and Brovana nebs. 3. GI- C Dif NEGATIVE. Continue TPN, boost, vital supplements, calorie counts. On nightly TFs. KUB this  am shows persistent ileus (pattern similar to last KUB over a week ago), feeding tube at tip of ligament of Treitz. I held TFs last night at patient request because she thought this was causing her pain at night;as I suspected, she still had pain so will resume TFs tonight. C dif NEGATIVE 4. Edema secondary to low albumin. On Lasix 20 mg IV daily 5. DM-CBGs 126/139/88. On Insulin. Will consider restarting Glipizide once tolerating oral better. 6. Anemia-H and H stable  at 9.2 and 28.7 . She is NOT on ecasa or Lovenox. 7. Leukocytosis-Last WBC decreased to 16,500. 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  8. Hyponatremia-sodium with slight increase to 132 and is likely related to diuresis. Recheck in am   ZIMMERMAN,DONIELLE M PA-C 08/13/2016 8:00 AM   Waking in unit this am Now off tna now, will restart  Tf at night but not started last night due to abdominal discomfort  I have seen and examined Pablo Ledger and agree with the above assessment  and plan. Discussed case and reviewed films with Dr Grandville Silos - will restart tna  [name] Beeper [phone] Office [phone] 08/13/2016 9:19 AM

## 2016-08-13 NOTE — Progress Notes (Signed)
Progress Note  SUBJECTIVE:    Feeling nauseous right now. Abdominal pain is about the same.   OBJECTIVE:   Vitals:   08/13/16 0600 08/13/16 0734  BP: (!) 121/45   Pulse: 93   Resp: 16   Temp:  98.4 F (36.9 C)    Intake/Output Summary (Last 24 hours) at 08/13/16 0816 Last data filed at 08/13/16 0400  Gross per 24 hour  Intake              588 ml  Output             1900 ml  Net            -1312 ml   Abdomen is mildly distended but soft. Mild diffuse tenderness to palpation Right groin incision clean, dry and intact without erythema or drainage.  2+ DP pulses bilaterally.   ASSESSMENT/PLAN:   61 y.o. female is s/p: TEVAR 7 Days Post-Op   Tube feeds held last night and patient still reporting abdominal pain over night and this am. KUB from yesterday showing persistent ileus. It is essentially unchanged from prior study 10 days ago.   WBC trending down.   C. Diff negative yesterday.   Advance diet as tolerated.   Emphasized importance of mobilizing.   Joelene Millin A Trinh 08/13/2016 8:16 AM -- LABS:   CBC    Component Value Date/Time   WBC 16.5 (H) 08/13/2016 0351   HGB 9.2 (L) 08/13/2016 0351   HCT 28.2 (L) 08/13/2016 0351   PLT 192 08/13/2016 0351    BMET    Component Value Date/Time   NA 132 (L) 08/13/2016 0351   K 3.8 08/13/2016 0351   CL 101 08/13/2016 0351   CO2 23 08/13/2016 0351   GLUCOSE 82 08/13/2016 0351   BUN 21 (H) 08/13/2016 0351   CREATININE 0.93 08/13/2016 0351   CALCIUM 7.7 (L) 08/13/2016 0351   GFRNONAA >60 08/13/2016 0351   GFRAA >60 08/13/2016 0351    COAG Lab Results  Component Value Date   INR 1.24 08/06/2016   INR 1.08 07/29/2016   INR 1.72 07/19/2016   No results found for: PTT  ANTIBIOTICS:   Anti-infectives    Start     Dose/Rate Route Frequency Ordered Stop   08/06/16 1400  vancomycin (VANCOCIN) IVPB 1000 mg/200 mL premix     1,000 mg 200 mL/hr over 60 Minutes Intravenous To ShortStay Surgical 08/06/16 0038  08/06/16 1814   08/04/16 1100  ceFEPIme (MAXIPIME) 1 g in dextrose 5 % 50 mL IVPB     1 g 100 mL/hr over 30 Minutes Intravenous Every 12 hours 08/04/16 1049 08/11/16 2359   07/27/16 1400  meropenem (MERREM) 1 g in sodium chloride 0.9 % 100 mL IVPB     1 g 200 mL/hr over 30 Minutes Intravenous Every 8 hours 07/27/16 0849 07/29/16 2143   07/25/16 2200  meropenem (MERREM) 1 g in sodium chloride 0.9 % 100 mL IVPB  Status:  Discontinued     1 g 200 mL/hr over 30 Minutes Intravenous Every 12 hours 07/25/16 1223 07/27/16 0849   07/23/16 1000  ceFEPIme (MAXIPIME) 1 g in dextrose 5 % 50 mL IVPB  Status:  Discontinued     1 g 100 mL/hr over 30 Minutes Intravenous Every 8 hours 07/23/16 0911 07/23/16 0917   07/23/16 1000  meropenem (MERREM) 1 g in sodium chloride 0.9 % 100 mL IVPB  Status:  Discontinued     1 g 200 mL/hr over  30 Minutes Intravenous Every 8 hours 07/23/16 0919 07/25/16 1223   07/18/16 0000  levofloxacin (LEVAQUIN) IVPB 500 mg  Status:  Discontinued     500 mg 100 mL/hr over 60 Minutes Intravenous Every 24 hours 07/17/16 0903 07/17/16 0938   07/17/16 1100  vancomycin (VANCOCIN) 1,500 mg in sodium chloride 0.9 % 500 mL IVPB     1,500 mg 250 mL/hr over 120 Minutes Intravenous Every 24 hours 07/17/16 0946 07/23/16 1221   07/17/16 1000  levofloxacin (LEVAQUIN) tablet 500 mg  Status:  Discontinued     500 mg Oral Daily 07/17/16 0851 07/17/16 0903   07/15/16 0900  ciprofloxacin (CIPRO) tablet 500 mg  Status:  Discontinued     500 mg Oral 2 times daily 07/15/16 0828 07/17/16 0851   07/14/16 1115  levofloxacin (LEVAQUIN) tablet 500 mg  Status:  Discontinued     500 mg Oral Daily 07/14/16 1107 07/14/16 1214   06/26/16 0830  meropenem (MERREM) 1 g in sodium chloride 0.9 % 100 mL IVPB  Status:  Discontinued     1 g 200 mL/hr over 30 Minutes Intravenous Every 8 hours 06/26/16 0810 07/06/16 0804   06/22/16 1800  clindamycin (CLEOCIN) IVPB 600 mg  Status:  Discontinued     600 mg 100 mL/hr  over 30 Minutes Intravenous Every 8 hours 06/22/16 1723 06/26/16 0810   06/22/16 1000  azithromycin (ZITHROMAX) 250 mg in dextrose 5 % 125 mL IVPB  Status:  Discontinued     250 mg 125 mL/hr over 60 Minutes Intravenous Every 24 hours 06/22/16 0826 06/27/16 1019   06/22/16 0900  levofloxacin (LEVAQUIN) IVPB 500 mg  Status:  Discontinued     500 mg 100 mL/hr over 60 Minutes Intravenous Every 24 hours 06/22/16 0759 06/22/16 [place], PA-C [facility] Office: [phone] Pager: [phone] 08/13/2016 8:16 AM  I have independently interviewed patient and agree with PA assessment and plan above. Encouraged ambulation. No planned intervention for persistent ileus by kub. Abdominal exam is benign.   Brandon C. Donzetta Matters, MD [facility] [place] Office: [phone] Pager: [phone]

## 2016-08-13 NOTE — Progress Notes (Signed)
PHARMACY - ADULT TOTAL PARENTERAL NUTRITION CONSULT NOTE   Pharmacy Consult:  TPN Indication:  Prolonged NPO status/persistent ileus  Patient Measurements: Height: 5\' 3"  (160 cm) Weight: 181 lb 7 oz (82.3 kg) IBW/kg (Calculated) : 52.4 TPN AdjBW (KG): 61.7 Body mass index is 32.14 kg/m. 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 Patient had a significant history of N/V for 2-4 weeks PTA but weight appears to have been maintained. Patient has had several NG tube replacements due to nausea and vomiting.  Pharmacy consulted to manage TPN since 06/23/16, this was weaned off 7/10. New orders to restart. Will order baseline labs for 7/13 and enter appropriate TPN at that time.  Erin Hearing PharmD., BCPS Clinical Pharmacist Pager [phone] 08/13/2016 6:36 PM

## 2016-08-13 NOTE — Progress Notes (Signed)
Noted PT and OT are recommending [place] at d/c. Pt not medically ready yet for d/c. If pt and daughter would like to be considered for an inpt rehab admit, please order a rehab consult. [employer] insurance will have final approval for any rehab venue i.e.. CIR, SNF or [facility]. Please advise. [phone]

## 2016-08-14 DIAGNOSIS — I7102 Dissection of abdominal aorta: Secondary | ICD-10-CM

## 2016-08-14 LAB — BASIC METABOLIC PANEL
Anion gap: 7 (ref 5–15)
BUN: 26 mg/dL — ABNORMAL HIGH (ref 6–20)
CO2: 24 mmol/L (ref 22–32)
Calcium: 7.7 mg/dL — ABNORMAL LOW (ref 8.9–10.3)
Chloride: 100 mmol/L — ABNORMAL LOW (ref 101–111)
Creatinine, Ser: 1.18 mg/dL — ABNORMAL HIGH (ref 0.44–1.00)
GFR calc Af Amer: 57 mL/min — ABNORMAL LOW (ref >60)
GFR calc non Af Amer: 49 mL/min — ABNORMAL LOW (ref >60)
Glucose, Bld: 100 mg/dL — ABNORMAL HIGH (ref 65–99)
Potassium: 4.1 mmol/L (ref 3.5–5.1)
Sodium: 131 mmol/L — ABNORMAL LOW (ref 135–145)

## 2016-08-14 LAB — GLUCOSE, CAPILLARY
Glucose-Capillary: 101 mg/dL — ABNORMAL HIGH (ref 65–99)
Glucose-Capillary: 116 mg/dL — ABNORMAL HIGH (ref 65–99)
Glucose-Capillary: 82 mg/dL (ref 65–99)
Glucose-Capillary: 96 mg/dL (ref 65–99)

## 2016-08-14 LAB — PHOSPHORUS: Phosphorus: 4.5 mg/dL (ref 2.5–4.6)

## 2016-08-14 LAB — MAGNESIUM: Magnesium: 1.4 mg/dL — ABNORMAL LOW (ref 1.7–2.4)

## 2016-08-14 MED ORDER — HYDROMORPHONE HCL 1 MG/ML IJ SOLN
1.0000 mg | INTRAMUSCULAR | Status: DC | PRN
  Administered 2016-08-14 – 2016-08-15 (×3): 1 mg via INTRAVENOUS
  Filled 2016-08-14 (×3): qty 1

## 2016-08-14 MED ORDER — TRACE MINERALS CR-CU-MN-SE-ZN 10-1000-500-60 MCG/ML IV SOLN
INTRAVENOUS | Status: DC
  Filled 2016-08-14: qty 960

## 2016-08-14 MED ORDER — FAT EMULSION 20 % IV EMUL
240.0000 mL | INTRAVENOUS | Status: DC
  Filled 2016-08-14: qty 250

## 2016-08-14 NOTE — Progress Notes (Addendum)
PHARMACY - ADULT TOTAL PARENTERAL NUTRITION CONSULT NOTE   Pharmacy Consult:  TPN Indication:  Prolonged ileus in setting of mesenteric ischemia  Patient Measurements: Height: 5\' 3"  (160 cm) Weight: 171 lb 11.8 oz (77.9 kg) IBW/kg (Calculated) : 52.4 TPN AdjBW (KG): 61.7 Body mass index is 30.42 kg/m. 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 Patient had a significant history of N/V for 2-4 weeks PTA but weight appears to have been maintained. Patient has had several NG tube replacements due to nausea and vomiting.  Pharmacy consulted to manage TPN since 06/23/16.    Patient was able to be weaned off of TPN on 08/11/16; however, her PO intake remains minimal and she complains of abdominal pain on tube feeding.  Pharmacy consulted to resume TPN on 08/14/16.  GI: persistent ileus and mesenteric ischemia, LGIB. Prealbumin 13.  PPI PO, PRN Zofran, docusate, BM x2 Endo: DM on glipizide/metformin PTA. CBGs controlled Insulin requirements in the past 24 hours: 0 units SSI + Lantus 10 units BID Lytes: low Na/CL, Mag low at 1.4 (2gm given) Renal: SCr up 1.18, BUN 26 - UOP 0.1 ml/kg/hr, net +4.3L Pulm: pleural effusion, on 2L Arjay - Brovana, Pulmicort, PRN albuterol Cards: aortic dissection, s/p thoracic aortic stenting 7/5 - BP controlled, tachy - Lopressor, IV Lasix daily, PRN labetolol Hepatobil: LFTs / tbili WNL.   TG down to 159. Neuro: Celexa, Klonopin, PRN Dialudid/Xanax/APAP - pain score 7 ID: s/p Cefepime for PNA + possible UTI, 2 courses of Merrem for PNA, 3d Cipro >> s/p 7d vanc for E.faecalis UTI.  Afebrile, WBC down 16.5 Best Practices: Lovenox, SCDs, CHG, MC  TPN Access: PICC placed 06/21/16; exchanged 08/06/16 TPN start date: 06/23/16 >> 08/11/16, restart 08/14/16  Nutritional Goals (per RD recommendation on 7/3): 2000-2200 kCal and 110-120 gm protein  Current Nutrition:    Full liquid diet (consuming  10-25% of meals)   Plan:  - Restart Clinimix E 5/15 at 40 ml/hr (goal rate ~83 ml/hr) + 20% ILE at 20 ml/hr x 12 hrs.  Today's TPN will provide 11162 kCal and 48 gm of protein per day. - Daily multivitamin in TPN - Trace elements in TPN every other day d/t shortage, next due 7/13 - Continue SSI Q6H + Lantus 10 units BID + add 20 units of insulin in TPN based on previous trend - Watch CBGs closely and adjust insulin as appropriate - Standard TPN labs and nursing care orders   Haley Graham D. [name], PharmD, BCPS Pager:  [phone] 08/14/2016, 7:42 AM   ================================   Addendum: - TPN d/c'ed before it could be resumed.  FYI - TPN hang time is standard and starts at 1800 every day.  Consults received after 1230 will be postponed until the following day. - D/C TPN labs and nursing care orders   Haley Graham D. [name], PharmD, BCPS Pager:  [phone] 08/14/2016, 2:46 PM

## 2016-08-14 NOTE — Progress Notes (Signed)
Occupational Therapy Treatment Patient Details Name: CALAH GERSHMAN MRN: [id number] DOB: 1955-06-02 Today's Date: 08/14/2016    History of present illness Pt adm with type B aortic dissection. Treated non surgically with BP control and pain control. Pt developed pancreatitis and ileus with NG tube placed. TEVAR procedure 08/06/16. PMH - anxiety, arthritis   OT comments  Pt continues to require significant amount of encouragement for participation.  She ambulated ~176ft with min guard assist.  She performed simple grooming tasks standing at sink with min guard assist and max encouragement to perform.  Pt continues with anxiety and self limiting behaviors.  Spoke with pt about establishing daily goals she agrees to to work toward discharge.  She agreed to the following goals for the weekend 1)  Ambulate 3x/day; 2) use BSC, and no bedpans - this was posted on her white board and RN notified.  She will likely need continued encouragement.  Feel she would benefit from psych consult.   Follow Up Recommendations  Home health OT;Supervision/Assistance - 24 hour    Equipment Recommendations  3 in 1 bedside commode;Tub/shower bench    Recommendations for Other Services      Precautions / Restrictions Precautions Precautions: Fall Restrictions Weight Bearing Restrictions: No       Mobility Bed Mobility Overal bed mobility: Needs Assistance Bed Mobility: Rolling;Sidelying to Sit;Sit to Sidelying Rolling: Supervision Sidelying to sit: Min assist     Sit to sidelying: Min guard General bed mobility comments: assist to lift trunk and cues for sequencing   Transfers Overall transfer level: Needs assistance Equipment used: Rolling walker (2 wheeled) Transfers: Sit to/from [facility] Sit to Stand: Min guard Stand pivot transfers: Min guard       General transfer comment: min guard for safety    Balance Overall balance assessment: Needs assistance Sitting-balance  support: No upper extremity supported;Feet supported Sitting balance-Leahy Scale: Good     Standing balance support: During functional activity;No upper extremity supported Standing balance-Leahy Scale: Fair Standing balance comment: able to static stand without UE support                           ADL either performed or assessed with clinical judgement   ADL Overall ADL's : Needs assistance/impaired     Grooming: Wash/dry hands;Wash/dry face;Min guard;Standing Grooming Details (indicate cue type and reason): requires max encouragement to perform                  Toilet Transfer: Min guard;Stand-pivot;BSC;RW           Functional mobility during ADLs: Min guard;Rolling walker       Vision       Perception     Praxis      Cognition Arousal/Alertness: Awake/alert Behavior During Therapy: Anxious;Flat affect Overall Cognitive Status: Impaired/Different from baseline Area of Impairment: Problem solving;Memory                     Memory: Decreased short-term memory       Problem Solving: Difficulty sequencing;Requires verbal cues;Requires tactile cues General Comments: pt continues to have periods of confusion and emotional at times; pt is very anxious with mobility; self-limiting behavior        Exercises     Shoulder Instructions       General Comments VSS.   Daily goals established with pt for the weekend and posted on her white board - ambulate 3x/day and BSC  transfers - no bedpan use.  RN notified     Pertinent Vitals/ Pain       Pain Assessment: Faces Faces Pain Scale: Hurts even more Pain Location: back and abdomen Pain Descriptors / Indicators: Grimacing;Guarding Pain Intervention(s): Patient requesting pain meds-RN notified;Monitored during session  Home Living                                          Prior Functioning/Environment              Frequency  Min 2X/week        Progress Toward  Goals  OT Goals(current goals can now be found in the care plan section)  Progress towards OT goals: Progressing toward goals     Plan Discharge plan remains appropriate    Co-evaluation                 AM-PAC PT "6 Clicks" Daily Activity     Outcome Measure   Help from another person eating meals?: A Lot Help from another person taking care of personal grooming?: A Little Help from another person toileting, which includes using toliet, bedpan, or urinal?: A Lot Help from another person bathing (including washing, rinsing, drying)?: A Lot Help from another person to put on and taking off regular upper body clothing?: A Lot Help from another person to put on and taking off regular lower body clothing?: A Lot 6 Click Score: 13    End of Session Equipment Utilized During Treatment: Gait belt;Rolling walker  OT Visit Diagnosis: Unsteadiness on feet (R26.81);Pain Pain - part of body:  (abdomen and back )   Activity Tolerance Patient tolerated treatment well   Patient Left in bed;with call bell/phone within reach;with family/visitor present   Nurse Communication Mobility status        Time: 1449-1550 OT Time Calculation (min): 61 min  Charges: OT General Charges $OT Visit: 1 Procedure OT Treatments $Self Care/Home Management : 23-37 mins $Therapeutic Activity: 23-37 mins  [facility], OTR/L [phone]    Lucille Passy M 08/14/2016, 3:55 PM

## 2016-08-14 NOTE — Progress Notes (Signed)
  Progress Note    08/14/2016 8:08 AM 8 Days Post-Op  Subjective:  Complains of generalized pain  Vitals:   08/14/16 0745 08/14/16 0805  BP:    Pulse: (!) 106   Resp: (!) 24   Temp:  98.2 F (36.8 C)    Physical Exam: Awake and alert Non labored respirations Palpable left radial pulse Abdomen is soft and minimally ttp R groin incision cdi Palpable dp bilaterally  CBC    Component Value Date/Time   WBC 16.5 (H) 08/13/2016 0351   RBC 3.01 (L) 08/13/2016 0351   HGB 9.2 (L) 08/13/2016 0351   HCT 28.2 (L) 08/13/2016 0351   PLT 192 08/13/2016 0351   MCV 93.7 08/13/2016 0351   MCH 30.6 08/13/2016 0351   MCHC 32.6 08/13/2016 0351   RDW 15.9 (H) 08/13/2016 0351   LYMPHSABS 2.9 08/10/2016 0453   MONOABS 0.9 08/10/2016 0453   EOSABS 0.2 08/10/2016 0453   BASOSABS 0.0 08/10/2016 0453    BMET    Component Value Date/Time   NA 131 (L) 08/14/2016 0320   K 4.1 08/14/2016 0320   CL 100 (L) 08/14/2016 0320   CO2 24 08/14/2016 0320   GLUCOSE 100 (H) 08/14/2016 0320   BUN 26 (H) 08/14/2016 0320   CREATININE 1.18 (H) 08/14/2016 0320   CALCIUM 7.7 (L) 08/14/2016 0320   GFRNONAA 49 (L) 08/14/2016 0320   GFRAA 57 (L) 08/14/2016 0320    INR    Component Value Date/Time   INR 1.24 08/06/2016 2335     Intake/Output Summary (Last 24 hours) at 08/14/16 0808 Last data filed at 08/14/16 0805  Gross per 24 hour  Intake              370 ml  Output              300 ml  Net               70 ml     Assessment:  61 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 Persistent ileus and abdominal pain, exam is benign  Plan: Continue to encourage oob and diet as tolerates and need to limit narcotic medications to give her gut the best chance of improving. If pain complaints persist might have to re-CT although not sure any further intervention will have drastic improvements at this point.    Jayde Daffin C. Donzetta Matters, MD [facility] Office: [phone] Pager:  [phone]  08/14/2016 8:08 AM a

## 2016-08-14 NOTE — Progress Notes (Signed)
Pt requested for Nutritionist to come explain foods she is able to eat. Called dietician  and made aware

## 2016-08-14 NOTE — Anesthesia Postprocedure Evaluation (Signed)
Anesthesia Post Note  Patient: Haley Graham  Procedure(s) Performed: Procedure(s) (LRB): THORACIC AORTIC ENDOVASCULAR STENT GRAFT/Thorasic and Abdominal Angiogram, Entravascular ultrasound., Open femoral exposure,Left Brachial access, and patch angioplasty right femoral artery. (N/A)     Patient location during evaluation: PACU Anesthesia Type: General Level of consciousness: awake, awake and alert and oriented Pain management: pain level controlled Vital Signs Assessment: post-procedure vital signs reviewed and stable Respiratory status: spontaneous breathing, nonlabored ventilation and respiratory function stable Cardiovascular status: blood pressure returned to baseline Anesthetic complications: no    Last Vitals:  Vitals:   08/14/16 0837 08/14/16 0900  BP: (!) 135/55 (!) 136/51  Pulse: (!) 101 99  Resp:  (!) 23  Temp:      Last Pain:  Vitals:   08/14/16 0815  TempSrc:   PainSc: 3                  Launa Goedken COKER

## 2016-08-14 NOTE — Progress Notes (Signed)
T[facility] BRIEF SICU PROGRESS NOTE  8 Days Post-Op  S/P Procedure(s) (LRB): THORACIC AORTIC ENDOVASCULAR STENT GRAFT/Thorasic and Abdominal Angiogram, Entravascular ultrasound., Open femoral exposure,Left Brachial access, and patch angioplasty right femoral artery. (N/A)   Stable day  Plan: Continue current plan  Rexene Alberts, MD 08/14/2016 7:55 PM

## 2016-08-14 NOTE — Progress Notes (Addendum)
Nutrition Follow-up  DOCUMENTATION CODES:   Obesity unspecified  INTERVENTION:    Mighty Shake II TID with meals, each supplement provides 480-500 kcals and 20-23 grams of protein  NUTRITION DIAGNOSIS:   Inadequate oral intake related to altered GI function as evidenced by meal completion < 50%.  Ongoing  GOAL:   Patient will meet greater than or equal to 90% of their needs  Unmet  MONITOR:   PO intake, Supplement acceptance, Diet advancement, I & O's  ASSESSMENT:   Pt with acute aortic dissection originating at the proximal descending thoracic aorta. Pt admitted for pain control, blood pressure control, bed rest and IV hydration.   Cortrak in place, tip is post-pyloric. Patient has been refusing nocturnal TF due to abd pain. She is very anxious. TPN was re-ordered, but has been discontinued. Diet has been advanced to soft diet. Patient is eating minimally. She did not like the Magic cup supplement because it was too thick, will try Mighty Shake with meals. Discussed soft diet options and assisted patient in ordering her supper for today. Labs and medications reviewed.  Diet Order:  DIET SOFT Room service appropriate? Yes; Fluid consistency: Thin  Skin:   (MASD perimeum)  Last BM:  7/13  Height:   Ht Readings from Last 1 Encounters:  07/25/16 5\' 3"  (1.6 m)    Weight:   Wt Readings from Last 1 Encounters:  08/14/16 171 lb 11.8 oz (77.9 kg)    Ideal Body Weight:  52.2 kg  BMI:  Body mass index is 30.42 kg/m.  Estimated Nutritional Needs:   Kcal:  2000-2200  Protein:  110-120 gm  Fluid:  per MD  EDUCATION NEEDS:   No education needs identified at this time  Molli Barrows, [place], [place], [place] Pager [phone] After Hours Pager [phone]

## 2016-08-14 NOTE — Progress Notes (Addendum)
TCTS DAILY ICU PROGRESS NOTE                   [address]            Haley Graham,Haley Graham [id number]          [phone]   8 Days Post-Op Procedure(s) (LRB): THORACIC AORTIC ENDOVASCULAR STENT GRAFT/Thorasic and Abdominal Angiogram, Entravascular ultrasound., Open femoral exposure,Left Brachial access, and patch angioplasty right femoral artery. (N/A)  Total Length of Stay:  LOS: 55 days   Subjective: She had nausea yesterday and continues with diffuse belly pain.  Objective: Vital signs in last 24 hours: Temp:  [97.7 F (36.5 C)-99.3 F (37.4 C)] 98.2 F (36.8 C) (07/13 0805) Pulse Rate:  [91-109] 99 (07/13 0900) Cardiac Rhythm: Sinus tachycardia (07/13 0800) Resp:  [15-41] 23 (07/13 0900) BP: (91-138)/(44-117) 136/51 (07/13 0900) SpO2:  [92 %-98 %] 97 % (07/13 0900) Weight:  [77.9 kg (171 lb 11.8 oz)] 77.9 kg (171 lb 11.8 oz) (07/13 0300)  Filed Weights   08/11/16 0600 08/13/16 0404 08/14/16 0300  Weight: 82.7 kg (182 lb 5.1 oz) 82.3 kg (181 lb 7 oz) 77.9 kg (171 lb 11.8 oz)      Intake/Output from previous day: 07/12 0701 - 07/13 0700 In: 370 [P.O.:360; I.V.:10] Out: 150 [Urine:150]  Intake/Output this shift: Total I/O In: 130 [P.O.:100; I.V.:30] Out: 150 [Urine:150]  Current Meds: Scheduled Meds: . arformoterol  15 mcg Nebulization BID  . budesonide (PULMICORT) nebulizer solution  0.5 mg Nebulization BID  . Chlorhexidine Gluconate Cloth  6 each Topical Daily  . citalopram  40 mg Oral Daily  . clonazePAM  1 mg Oral QHS  . docusate sodium  100 mg Oral Daily  . enoxaparin (LOVENOX) injection  40 mg Subcutaneous Q24H  . furosemide  20 mg Intravenous Daily  . insulin aspart  0-24 Units Subcutaneous Q6H  . insulin glargine  10 Units Subcutaneous BID  . magic mouthwash  5 mL Oral QID  . metoprolol tartrate  25 mg Oral BID  . pantoprazole  40 mg Oral Daily  . sodium chloride flush  10-40 mL Intracatheter Q12H   Continuous Infusions: . sodium chloride  Stopped (08/12/16 2000)  . Marland KitchenTPN (CLINIMIX-E) Adult     And  . fat emulsion    . lactated ringers Stopped (08/07/16 0900)   PRN Meds:.acetaminophen **OR** acetaminophen, ALPRAZolam, alum & mag hydroxide-simeth, [name]'s butt cream, guaiFENesin-dextromethorphan, HYDROmorphone (DILAUDID) injection, labetalol, metoprolol tartrate, ondansetron (ZOFRAN) IV, phenol, pneumococcal 23 valent vaccine, simethicone, sodium chloride flush  Heart: RRR Lungs: Clear to auscultation bilaterally Abdomen: Soft, distended, high pitched tinkling bowel sounds, diffusely tender Extremities: SCDs in place  Lab Results: CBC:  Recent Labs  08/13/16 0351  WBC 16.5*  HGB 9.2*  HCT 28.2*  PLT 192   BMET:   Recent Labs  08/13/16 0351 08/14/16 0320  NA 132* 131*  K 3.8 4.1  CL 101 100*  CO2 23 24  GLUCOSE 82 100*  BUN 21* 26*  CREATININE 0.93 1.18*  CALCIUM 7.7* 7.7*    CMET: Lab Results  Component Value Date   WBC 16.5 (H) 08/13/2016   HGB 9.2 (L) 08/13/2016   HCT 28.2 (L) 08/13/2016   PLT 192 08/13/2016   GLUCOSE 100 (H) 08/14/2016   TRIG 159 (H) 08/10/2016   ALT 12 (L) 08/10/2016   AST 14 (L) 08/10/2016   NA 131 (L) 08/14/2016   K 4.1 08/14/2016   CL 100 (L) 08/14/2016  CREATININE 1.18 (H) 08/14/2016   BUN 26 (H) 08/14/2016   CO2 24 08/14/2016   INR 1.24 08/06/2016    PT/INR:  No results for input(s): LABPROT, INR in the last 72 hours. Radiology: No results found.   Assessment/Plan: 1. CV-SR in the 90's. SBP fairly well controlled. Continue Lopressor 25 mg bid. 2. Pulmonary-On room air. Continue Pulmicort and Brovana nebs. 3. GI- C Dif NEGATIVE. Continue TPN, boost, vital supplements, calorie counts. On nightly TFs.  Check KUB in am. Will discuss further management with Dr. Servando Snare. 4. Edema secondary to low albumin. On Lasix 20 mg IV daily 5. DM-CBGs 130/143/116. On Insulin. Will consider restarting Glipizide once tolerating oral better. 6. Anemia-H and H stable  at 9.2  and 28.7 . She is NOT on ecasa or Lovenox. 7. Leukocytosis-Last WBC decreased to 16,500. Re check in am. 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  8. Hyponatremia-sodium remains around 131 and is likely related to diuresis.    [name] PA-C 08/14/2016 11:45 AM   Patient says abdomen feels better today TNA  never got started  since ordered yesterday so will d/c Patient has refused tube feeding Discussed with her increasing the interval of iv narcotic administration   I have seen and examined Haley Graham and agree with the above assessment  and plan.  [name] [facility] [phone] Office [phone] 08/14/2016 2:43 PM

## 2016-08-14 NOTE — Progress Notes (Signed)
Dr. Servando Snare came to see pt. Encouraged to walk and eat if able to tolerate. Pts husband at bedside. Pt informed by this RN we will walk again this afternoon pt got very tearful and states she does not want to walk. Husband very encouraging to walk. Pt still refuses to walk this afternoon will attempt again and continue to monitor.

## 2016-08-14 NOTE — Progress Notes (Signed)
Physical Therapy Treatment Patient Details Name: Haley Graham MRN: [id number] DOB: 1955-04-06 Today's Date: 08/14/2016    History of Present Illness Pt adm with type B aortic dissection. Treated non surgically with BP control and pain control. Pt developed pancreatitis and ileus with NG tube placed. TEVAR procedure 08/06/16. PMH - anxiety, arthritis    PT Comments    Patient is overall min guard/supervision for OOB mobility and VSS. Pt encouraged to increase mobilization as she continues to demonstrate self limiting behaviors and is very anxious with mobility.  Pt declined therex and sitting in chair so bed in chair position end of session. PT will continue to follow acutely.   Follow Up Recommendations  Home health PT;Supervision/Assistance - 24 hour     Equipment Recommendations  Rolling walker with 5" wheels    Recommendations for Other Services       Precautions / Restrictions Precautions Precautions: Fall Restrictions Weight Bearing Restrictions: No    Mobility  Bed Mobility Overal bed mobility: Needs Assistance Bed Mobility: Rolling;Sidelying to Sit;Sit to Sidelying Rolling: Supervision Sidelying to sit: Min assist     Sit to sidelying: Min guard General bed mobility comments: cues for sequencing; assist to elevate trunk into sitting; use of bed rail  Transfers Overall transfer level: Needs assistance Equipment used: Rolling walker (2 wheeled) Transfers: Sit to/from Stand Sit to Stand: Min guard         General transfer comment: min guard for safety  Ambulation/Gait Ambulation/Gait assistance: Min guard Ambulation Distance (Feet): 220 Feet Assistive device: Rolling walker (2 wheeled) Gait Pattern/deviations: Step-through pattern;Decreased stride length Gait velocity: decr   General Gait Details: cues for breathing and encouragement to increase distance and cadence; slow, steady gait   Stairs            Wheelchair Mobility    Modified Rankin  (Stroke Patients Only)       Balance Overall balance assessment: Needs assistance Sitting-balance support: No upper extremity supported;Feet supported Sitting balance-Leahy Scale: Good     Standing balance support: During functional activity;No upper extremity supported Standing balance-Leahy Scale: Fair Standing balance comment: able to static stand without UE support                            Cognition Arousal/Alertness: Awake/alert Behavior During Therapy: Anxious;WFL for tasks assessed/performed Overall Cognitive Status: Impaired/Different from baseline Area of Impairment: Problem solving;Memory                     Memory: Decreased short-term memory       Problem Solving: Requires verbal cues General Comments: pt continues to have periods of confusion and emotional at times; pt is very anxious with mobility; self-limiting behavior      Exercises      General Comments        Pertinent Vitals/Pain Pain Assessment: Faces Faces Pain Scale: Hurts even more Pain Location: back and abdomen Pain Descriptors / Indicators: Grimacing;Guarding Pain Intervention(s): Monitored during session;Premedicated before session;Repositioned;Patient requesting pain meds-RN notified    Home Living                      Prior Function            PT Goals (current goals can now be found in the care plan section) Progress towards PT goals: Progressing toward goals    Frequency    Min 3X/week      PT  Plan Current plan remains appropriate    Co-evaluation              AM-PAC PT "6 Clicks" Daily Activity  Outcome Measure  Difficulty turning over in bed (including adjusting bedclothes, sheets and blankets)?: A Little Difficulty moving from lying on back to sitting on the side of the bed? : Total Difficulty sitting down on and standing up from a chair with arms (e.g., wheelchair, bedside commode, etc,.)?: A Little Help needed moving to and  from a bed to chair (including a wheelchair)?: None Help needed walking in hospital room?: None Help needed climbing 3-5 steps with a railing? : A Little 6 Click Score: 18    End of Session Equipment Utilized During Treatment: Gait belt Activity Tolerance: Patient tolerated treatment well Patient left: with call bell/phone within reach;with nursing/sitter in room;in bed;with SCD's reapplied (bed in chair position) Nurse Communication: Mobility status PT Visit Diagnosis: Muscle weakness (generalized) (M62.81);Other abnormalities of gait and mobility (R26.89)     Time: 7353-2992 PT Time Calculation (min) (ACUTE ONLY): 33 min  Charges:  $Gait Training: 8-22 mins $Therapeutic Activity: 8-22 mins                    G Codes:       Earney Navy, PTA Pager: [phone]     Darliss Cheney 08/14/2016, 1:09 PM

## 2016-08-15 ENCOUNTER — Inpatient Hospital Stay (HOSPITAL_COMMUNITY): Payer: BLUE CROSS/BLUE SHIELD

## 2016-08-15 LAB — BASIC METABOLIC PANEL
Anion gap: 5 (ref 5–15)
BUN: 21 mg/dL — ABNORMAL HIGH (ref 6–20)
CO2: 26 mmol/L (ref 22–32)
Calcium: 7.6 mg/dL — ABNORMAL LOW (ref 8.9–10.3)
Chloride: 100 mmol/L — ABNORMAL LOW (ref 101–111)
Creatinine, Ser: 1 mg/dL (ref 0.44–1.00)
GFR calc Af Amer: >60 mL/min (ref >60)
GFR calc non Af Amer: >60 mL/min (ref >60)
Glucose, Bld: 70 mg/dL (ref 65–99)
Potassium: 3.3 mmol/L — ABNORMAL LOW (ref 3.5–5.1)
Sodium: 131 mmol/L — ABNORMAL LOW (ref 135–145)

## 2016-08-15 LAB — CBC
HCT: 27 % — ABNORMAL LOW (ref 36.0–46.0)
Hemoglobin: 8.6 g/dL — ABNORMAL LOW (ref 12.0–15.0)
MCH: 29.6 pg (ref 26.0–34.0)
MCHC: 31.9 g/dL (ref 30.0–36.0)
MCV: 92.8 fL (ref 78.0–100.0)
Platelets: 205 10*3/uL (ref 150–400)
RBC: 2.91 MIL/uL — ABNORMAL LOW (ref 3.87–5.11)
RDW: 15.8 % — ABNORMAL HIGH (ref 11.5–15.5)
WBC: 13.9 10*3/uL — ABNORMAL HIGH (ref 4.0–10.5)

## 2016-08-15 LAB — GLUCOSE, CAPILLARY
Glucose-Capillary: 105 mg/dL — ABNORMAL HIGH (ref 65–99)
Glucose-Capillary: 105 mg/dL — ABNORMAL HIGH (ref 65–99)
Glucose-Capillary: 67 mg/dL (ref 65–99)
Glucose-Capillary: 72 mg/dL (ref 65–99)

## 2016-08-15 LAB — MAGNESIUM: Magnesium: 1.7 mg/dL (ref 1.7–2.4)

## 2016-08-15 LAB — PHOSPHORUS: Phosphorus: 4.2 mg/dL (ref 2.5–4.6)

## 2016-08-15 MED ORDER — POTASSIUM CHLORIDE 10 MEQ/50ML IV SOLN: 10.0000 meq | INTRAVENOUS | Status: DC | PRN

## 2016-08-15 MED ORDER — DEXTROSE 50 % IV SOLN: 25.0000 mL | Freq: Once | INTRAVENOUS | Status: DC

## 2016-08-15 MED ORDER — DEXTROSE 50 % IV SOLN
INTRAVENOUS | Status: AC
  Filled 2016-08-15: qty 50

## 2016-08-15 MED ORDER — HYDROMORPHONE HCL 1 MG/ML IJ SOLN
1.0000 mg | Freq: Four times a day (QID) | INTRAMUSCULAR | Status: DC | PRN
  Administered 2016-08-15 – 2016-08-17 (×7): 1 mg via INTRAVENOUS
  Filled 2016-08-15 (×7): qty 1

## 2016-08-15 MED ORDER — POTASSIUM CHLORIDE 10 MEQ/50ML IV SOLN
10.0000 meq | INTRAVENOUS | Status: AC
  Administered 2016-08-15 (×3): 10 meq via INTRAVENOUS
  Filled 2016-08-15 (×3): qty 50

## 2016-08-15 NOTE — Progress Notes (Signed)
Patient ID: Haley Graham, female   DOB: 07-22-1955, 61 y.o.   MRN: [id number] TCTS DAILY ICU PROGRESS NOTE                   [address]            [place] [address]          [phone]   9 Days Post-Op Procedure(s) (LRB): THORACIC AORTIC ENDOVASCULAR STENT GRAFT/Thorasic and Abdominal Angiogram, Entravascular ultrasound., Open femoral exposure,Left Brachial access, and patch angioplasty right femoral artery. (N/A)  Total Length of Stay:  LOS: 56 days   Subjective: Feels better today says abdomen better today   Objective: Vital signs in last 24 hours: Temp:  [97.6 F (36.4 C)-98.4 F (36.9 C)] 98.4 F (36.9 C) (07/14 0700) Pulse Rate:  [76-97] 86 (07/14 1100) Cardiac Rhythm: Normal sinus rhythm (07/14 0800) Resp:  [12-29] 18 (07/14 1100) BP: (106-137)/(39-63) 123/46 (07/14 1100) SpO2:  [89 %-99 %] 99 % (07/14 1100) Weight:  [168 lb 6.9 oz (76.4 kg)] 168 lb 6.9 oz (76.4 kg) (07/14 0200)  Filed Weights   08/13/16 0404 08/14/16 0300 08/15/16 0200  Weight: 181 lb 7 oz (82.3 kg) 171 lb 11.8 oz (77.9 kg) 168 lb 6.9 oz (76.4 kg)      Intake/Output from previous day: 07/13 0701 - 07/14 0700 In: 710 [P.O.:560; I.V.:50; IV Piggyback:100] Out: 1050 [Urine:750; Stool:300]  Intake/Output this shift: Total I/O In: 120 [P.O.:120] Out: 100 [Urine:100]  Current Meds: Scheduled Meds: . arformoterol  15 mcg Nebulization BID  . budesonide (PULMICORT) nebulizer solution  0.5 mg Nebulization BID  . Chlorhexidine Gluconate Cloth  6 each Topical Daily  . citalopram  40 mg Oral Daily  . clonazePAM  1 mg Oral QHS  . docusate sodium  100 mg Oral Daily  . enoxaparin (LOVENOX) injection  40 mg Subcutaneous Q24H  . furosemide  20 mg Intravenous Daily  . insulin aspart  0-24 Units Subcutaneous Q6H  . insulin glargine  10 Units Subcutaneous BID  . magic mouthwash  5 mL Oral QID  . metoprolol tartrate  25 mg Oral BID  . pantoprazole  40 mg Oral Daily  . sodium  chloride flush  10-40 mL Intracatheter Q12H   Continuous Infusions: . sodium chloride Stopped (08/12/16 2000)  . lactated ringers Stopped (08/07/16 0900)   PRN Meds:.acetaminophen **OR** acetaminophen, ALPRAZolam, alum & mag hydroxide-simeth, Ceaira Ernster's butt cream, guaiFENesin-dextromethorphan, HYDROmorphone (DILAUDID) injection, labetalol, metoprolol tartrate, ondansetron (ZOFRAN) IV, phenol, pneumococcal 23 valent vaccine, simethicone, sodium chloride flush  Heart: RRR Lungs: Clear to auscultation bilaterally Abdomen: Soft, distended,less tender r Extremities: SCDs in place  Lab Results: CBC:  Recent Labs  08/13/16 0351 08/15/16 0320  WBC 16.5* 13.9*  HGB 9.2* 8.6*  HCT 28.2* 27.0*  PLT 192 205   BMET:   Recent Labs  08/14/16 0320 08/15/16 0320  NA 131* 131*  K 4.1 3.3*  CL 100* 100*  CO2 24 26  GLUCOSE 100* 70  BUN 26* 21*  CREATININE 1.18* 1.00  CALCIUM 7.7* 7.6*    CMET: Lab Results  Component Value Date   WBC 13.9 (H) 08/15/2016   HGB 8.6 (L) 08/15/2016   HCT 27.0 (L) 08/15/2016   PLT 205 08/15/2016   GLUCOSE 70 08/15/2016   TRIG 159 (H) 08/10/2016   ALT 12 (L) 08/10/2016   AST 14 (L) 08/10/2016   NA 131 (L) 08/15/2016   K 3.3 (L) 08/15/2016   CL 100 (L) 08/15/2016  CREATININE 1.00 08/15/2016   BUN 21 (H) 08/15/2016   CO2 26 08/15/2016   INR 1.24 08/06/2016    PT/INR:  No results for input(s): LABPROT, INR in the last 72 hours. Radiology: Dg Chest Port 1 View  Result Date: 08/15/2016 CLINICAL DATA:  Pneumonia.  Follow-up exam. EXAM: PORTABLE CHEST 1 VIEW COMPARISON:  08/10/2016 FINDINGS: Cardiac silhouette is normal size. Aortic stent is stable. No convincing mediastinal or hilar masses. Lung base opacity is similar to the prior study allowing for differences in patient positioning. This is most likely atelectasis. Remainder of the lungs is clear. No pulmonary edema. Suspect small left pleural effusion.  No evidence of a pneumothorax. Enteric  feeding tube passes below the diaphragm and below the field of view. Left PICC is stable and well positioned. IMPRESSION: 1. No significant change from the previous exam. 2. Persistent lung base opacity, most likely atelectasis. Suspect small left pleural effusion. Electronically Signed   By: Lajean Manes M.D.   On: 08/15/2016 07:32   Dg Abd Portable 1v  Result Date: 08/15/2016 CLINICAL DATA:  Fall adynamic ileus. EXAM: PORTABLE ABDOMEN - 1 VIEW COMPARISON:  08/13/2016 FINDINGS: Mildly dilated small bowel appears slightly improved when compared to the prior exam. Enteric feeding tube tip projects at the ligament of Treitz, stable. IMPRESSION: 1. Findings consistent with mild improvement in the adynamic ileus reflected by a decrease in bowel dilation. Electronically Signed   By: Lajean Manes M.D.   On: 08/15/2016 07:25     Assessment/Plan: 1. CV-SR in the 90's. SBP fairly well controlled. Continue Lopressor 25 mg bid. 2. Pulmonary-On room air. Continue Pulmicort and Brovana nebs. 3. GI- C Dif NEGATIVE. Continue  boost, vital supplements, calorie counts.  KUB  This am with improvement 4. Edema secondary to low albumin. On Lasix 20 mg IV daily 5. DM-CBGs 130/143/116. On Insulin. Will consider restarting Glipizide once tolerating oral better. 6. Anemia-H and H stable  At 8.6 d 26 . She is NOT on ecasa or Lovenox. 7. Leukocytosis-Last WBC decreased to 13,000 . Re check in am. 8. Hyponatremia-sodium remains around 131 and is likely related to diuresis.  8. Slowly decrease narcotic  [name] PA-C 08/15/2016 11:29 AM

## 2016-08-15 NOTE — Progress Notes (Signed)
  Progress Note    08/15/2016 11:21 AM 9 Days Post-Op  Subjective: no complaints of belly pain this a.m.  Vitals:   08/15/16 1000 08/15/16 1100  BP: (!) 114/47 (!) 123/46  Pulse: 86 86  Resp: 15 18  Temp:      Physical Exam: Awake and alert Palpable left radial pulse Abdomen is soft  R groin incision cdi with vac off Palpable dp bilaterally   CBC    Component Value Date/Time   WBC 13.9 (H) 08/15/2016 0320   RBC 2.91 (L) 08/15/2016 0320   HGB 8.6 (L) 08/15/2016 0320   HCT 27.0 (L) 08/15/2016 0320   PLT 205 08/15/2016 0320   MCV 92.8 08/15/2016 0320   MCH 29.6 08/15/2016 0320   MCHC 31.9 08/15/2016 0320   RDW 15.8 (H) 08/15/2016 0320   LYMPHSABS 2.9 08/10/2016 0453   MONOABS 0.9 08/10/2016 0453   EOSABS 0.2 08/10/2016 0453   BASOSABS 0.0 08/10/2016 0453    BMET    Component Value Date/Time   NA 131 (L) 08/15/2016 0320   K 3.3 (L) 08/15/2016 0320   CL 100 (L) 08/15/2016 0320   CO2 26 08/15/2016 0320   GLUCOSE 70 08/15/2016 0320   BUN 21 (H) 08/15/2016 0320   CREATININE 1.00 08/15/2016 0320   CALCIUM 7.6 (L) 08/15/2016 0320   GFRNONAA >60 08/15/2016 0320   GFRAA >60 08/15/2016 0320    INR    Component Value Date/Time   INR 1.24 08/06/2016 2335     Intake/Output Summary (Last 24 hours) at 08/15/16 1121 Last data filed at 08/15/16 0800  Gross per 24 hour  Intake              700 ml  Output             1000 ml  Net             -300 ml     Assessment:  61 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 Persistent ileus and abdominal pain, exam remains benign  Plan: She is in the chair this a.m agreeable to ambulate today. I again stressed limiting narcotics, activity as much as tolerated as well as po intake.     [name] C. Donzetta Matters, MD [facility] Office: [phone] Pager: [phone]  08/15/2016 11:21 AM

## 2016-08-15 NOTE — Progress Notes (Signed)
Pt has refused to ambulate today. Pt did sit up in recliner for 2 hours this am and has used bsc for toileting. Pt's cbg was 67 at lunch time (had said she would ambulate then) and feeling very weak and shaky. Pt is not eating . Took 2 bites of eggs this am and ate 2 graham crackers with peanut butter at lunch. Has refused shakes. Pt adamant doesn't want tube feeds ,this rn spoke to her about importance of getting nutrition. Continue to monitor. All pt seems to care about is when next dilaudid and zofran are due. Emotional encouragement given. Cloa Bushong

## 2016-08-15 NOTE — Progress Notes (Signed)
Patient ID: Haley Graham, female   DOB: Sep 23, 1955, 61 y.o.   MRN: [id number] EVENING ROUNDS NOTE :     [address]       [place] [address]             [phone]                 9 Days Post-Op Procedure(s) (LRB): THORACIC AORTIC ENDOVASCULAR STENT GRAFT/Thorasic and Abdominal Angiogram, Entravascular ultrasound., Open femoral exposure,Left Brachial access, and patch angioplasty right femoral artery. (N/A)  Total Length of Stay:  LOS: 56 days  BP (!) 165/56   Pulse (!) 111   Temp 97.9 F (36.6 C) (Oral)   Resp (!) 34   Ht 5\' 3"  (1.6 m)   Wt 168 lb 6.9 oz (76.4 kg)   SpO2 95%   BMI 29.84 kg/m   .Intake/Output      07/14 0701 - 07/15 0700   P.O. 360   Total Intake(mL/kg) 360 (4.7)   Urine (mL/kg/hr) 100 (0.1)   Total Output 100   Net +260       Stool Occurrence 1 x     . sodium chloride Stopped (08/12/16 2000)  . lactated ringers Stopped (08/07/16 0900)     Lab Results  Component Value Date   WBC 13.9 (H) 08/15/2016   HGB 8.6 (L) 08/15/2016   HCT 27.0 (L) 08/15/2016   PLT 205 08/15/2016   GLUCOSE 70 08/15/2016   TRIG 159 (H) 08/10/2016   ALT 12 (L) 08/10/2016   AST 14 (L) 08/10/2016   NA 131 (L) 08/15/2016   K 3.3 (L) 08/15/2016   CL 100 (L) 08/15/2016   CREATININE 1.00 08/15/2016   BUN 21 (H) 08/15/2016   CO2 26 08/15/2016   INR 1.24 08/06/2016   Wants more pain meds  adomne exam unchanged  k replaced   [name]  Beeper [phone] Office [phone] 08/15/2016 7:07 PM

## 2016-08-15 NOTE — Progress Notes (Signed)
Hypoglycemic Event  CBG: 67  Treatment: D50 IV 25 mL  Symptoms: Pale, Sweaty, Shaky and Hungry  Follow-up CBG: Time:1245 CBG Result:105  Possible Reasons for Event: Inadequate meal intake  Comments/MD notified:    Etta Quill

## 2016-08-16 LAB — BASIC METABOLIC PANEL
Anion gap: 6 (ref 5–15)
BUN: 16 mg/dL (ref 6–20)
CO2: 23 mmol/L (ref 22–32)
Calcium: 7.6 mg/dL — ABNORMAL LOW (ref 8.9–10.3)
Chloride: 102 mmol/L (ref 101–111)
Creatinine, Ser: 0.99 mg/dL (ref 0.44–1.00)
GFR calc Af Amer: >60 mL/min (ref >60)
GFR calc non Af Amer: >60 mL/min (ref >60)
Glucose, Bld: 79 mg/dL (ref 65–99)
Potassium: 3.2 mmol/L — ABNORMAL LOW (ref 3.5–5.1)
Sodium: 131 mmol/L — ABNORMAL LOW (ref 135–145)

## 2016-08-16 LAB — CBC
HCT: 28.1 % — ABNORMAL LOW (ref 36.0–46.0)
Hemoglobin: 9 g/dL — ABNORMAL LOW (ref 12.0–15.0)
MCH: 29.9 pg (ref 26.0–34.0)
MCHC: 32 g/dL (ref 30.0–36.0)
MCV: 93.4 fL (ref 78.0–100.0)
Platelets: 198 10*3/uL (ref 150–400)
RBC: 3.01 MIL/uL — ABNORMAL LOW (ref 3.87–5.11)
RDW: 16.3 % — ABNORMAL HIGH (ref 11.5–15.5)
WBC: 12.8 10*3/uL — ABNORMAL HIGH (ref 4.0–10.5)

## 2016-08-16 LAB — GLUCOSE, CAPILLARY
Glucose-Capillary: 70 mg/dL (ref 65–99)
Glucose-Capillary: 77 mg/dL (ref 65–99)
Glucose-Capillary: 88 mg/dL (ref 65–99)

## 2016-08-16 LAB — PHOSPHORUS: Phosphorus: 3.3 mg/dL (ref 2.5–4.6)

## 2016-08-16 LAB — MAGNESIUM: Magnesium: 1.6 mg/dL — ABNORMAL LOW (ref 1.7–2.4)

## 2016-08-16 MED ORDER — POTASSIUM CHLORIDE 10 MEQ/50ML IV SOLN
10.0000 meq | INTRAVENOUS | Status: AC | PRN
  Administered 2016-08-16 (×3): 10 meq via INTRAVENOUS
  Filled 2016-08-16: qty 50

## 2016-08-16 NOTE — Progress Notes (Signed)
  Progress Note    08/16/2016 10:05 AM 10 Days Post-Op  Subjective:  No new complaints this a.m.  Vitals:   08/16/16 0600 08/16/16 0700  BP: 127/64   Pulse: 84   Resp: 15   Temp:  98.5 F (36.9 C)    Physical Exam: Awake and alert, neuro in tact Abdomen is soft and non tender Palpable left radial, bilateral dp pulses  CBC    Component Value Date/Time   WBC 13.9 (H) 08/15/2016 0320   RBC 2.91 (L) 08/15/2016 0320   HGB 8.6 (L) 08/15/2016 0320   HCT 27.0 (L) 08/15/2016 0320   PLT 205 08/15/2016 0320   MCV 92.8 08/15/2016 0320   MCH 29.6 08/15/2016 0320   MCHC 31.9 08/15/2016 0320   RDW 15.8 (H) 08/15/2016 0320   LYMPHSABS 2.9 08/10/2016 0453   MONOABS 0.9 08/10/2016 0453   EOSABS 0.2 08/10/2016 0453   BASOSABS 0.0 08/10/2016 0453    BMET    Component Value Date/Time   NA 131 (L) 08/15/2016 0320   K 3.3 (L) 08/15/2016 0320   CL 100 (L) 08/15/2016 0320   CO2 26 08/15/2016 0320   GLUCOSE 70 08/15/2016 0320   BUN 21 (H) 08/15/2016 0320   CREATININE 1.00 08/15/2016 0320   CALCIUM 7.6 (L) 08/15/2016 0320   GFRNONAA >60 08/15/2016 0320   GFRAA >60 08/15/2016 0320    INR    Component Value Date/Time   INR 1.24 08/06/2016 2335     Intake/Output Summary (Last 24 hours) at 08/16/16 1005 Last data filed at 08/16/16 0600  Gross per 24 hour  Intake           572.33 ml  Output                0 ml  Net           572.33 ml     Assessment: 61 y.o.femaleis TEVAR for TBAD. Persistent ileus and abdominal pain although kub yesterday underwhelming, exam remains benign  Plan: Encouraged ambulation, po intake as tolerated and limiting narcotic pain meds. [place] for rehab from vascular standpoint   Erlene Quan C. Donzetta Matters, MD [facility] Office: [phone] Pager: [phone]  08/16/2016 10:05 AM

## 2016-08-16 NOTE — Progress Notes (Signed)
Patient ID: NINI CAVAN, female   DOB: March 07, 1955, 61 y.o.   MRN: [id number] TCTS DAILY ICU PROGRESS NOTE                   [address]            [place] [address]          [phone]   10 Days Post-Op Procedure(s) (LRB): THORACIC AORTIC ENDOVASCULAR STENT GRAFT/Thorasic and Abdominal Angiogram, Entravascular ultrasound., Open femoral exposure,Left Brachial access, and patch angioplasty right femoral artery. (N/A)  Total Length of Stay:  LOS: 57 days   Subjective: Up to chair , says  eats too hard   Objective: Vital signs in last 24 hours: Temp:  [97.6 F (36.4 C)-98.5 F (36.9 C)] 98.5 F (36.9 C) (07/15 0700) Pulse Rate:  [81-111] 84 (07/15 0600) Cardiac Rhythm: Normal sinus rhythm (07/15 0900) Resp:  [15-34] 15 (07/15 0600) BP: (115-165)/(37-64) 127/64 (07/15 0600) SpO2:  [93 %-100 %] 99 % (07/15 0908) Weight:  [165 lb 2 oz (74.9 kg)] 165 lb 2 oz (74.9 kg) (07/15 0700)  Filed Weights   08/14/16 0300 08/15/16 0200 08/16/16 0700  Weight: 171 lb 11.8 oz (77.9 kg) 168 lb 6.9 oz (76.4 kg) 165 lb 2 oz (74.9 kg)      Intake/Output from previous day: 07/14 0701 - 07/15 0700 In: 692.3 [P.O.:590; I.V.:102.3] Out: 100 [Urine:100]  Intake/Output this shift: Total I/O In: 80 [P.O.:60; I.V.:20] Out: -   Current Meds: Scheduled Meds: . arformoterol  15 mcg Nebulization BID  . budesonide (PULMICORT) nebulizer solution  0.5 mg Nebulization BID  . Chlorhexidine Gluconate Cloth  6 each Topical Daily  . citalopram  40 mg Oral Daily  . clonazePAM  1 mg Oral QHS  . dextrose  25 mL Intravenous Once  . docusate sodium  100 mg Oral Daily  . enoxaparin (LOVENOX) injection  40 mg Subcutaneous Q24H  . furosemide  20 mg Intravenous Daily  . insulin aspart  0-24 Units Subcutaneous Q6H  . insulin glargine  10 Units Subcutaneous BID  . magic mouthwash  5 mL Oral QID  . metoprolol tartrate  25 mg Oral BID  . pantoprazole  40 mg Oral Daily  . sodium chloride  flush  10-40 mL Intracatheter Q12H   Continuous Infusions: . sodium chloride 10 mL/hr at 08/15/16 2146  . lactated ringers Stopped (08/07/16 0900)   PRN Meds:.acetaminophen **OR** acetaminophen, ALPRAZolam, alum & mag hydroxide-simeth, Aileen Amore's butt cream, guaiFENesin-dextromethorphan, HYDROmorphone (DILAUDID) injection, labetalol, metoprolol tartrate, ondansetron (ZOFRAN) IV, phenol, pneumococcal 23 valent vaccine, simethicone, sodium chloride flush  Heart: RRR Lungs: Clear to auscultation bilaterally Abdomen: Soft,  Exam unchanged  Extremities: SCDs in place  Lab Results: CBC:  Recent Labs  08/15/16 0320 08/16/16 1100  WBC 13.9* 12.8*  HGB 8.6* 9.0*  HCT 27.0* 28.1*  PLT 205 198   BMET:   Recent Labs  08/14/16 0320 08/15/16 0320  NA 131* 131*  K 4.1 3.3*  CL 100* 100*  CO2 24 26  GLUCOSE 100* 70  BUN 26* 21*  CREATININE 1.18* 1.00  CALCIUM 7.7* 7.6*    CMET: Lab Results  Component Value Date   WBC 12.8 (H) 08/16/2016   HGB 9.0 (L) 08/16/2016   HCT 28.1 (L) 08/16/2016   PLT 198 08/16/2016   GLUCOSE 70 08/15/2016   TRIG 159 (H) 08/10/2016   ALT 12 (L) 08/10/2016   AST 14 (L) 08/10/2016   NA 131 (L) 08/15/2016  K 3.3 (L) 08/15/2016   CL 100 (L) 08/15/2016   CREATININE 1.00 08/15/2016   BUN 21 (H) 08/15/2016   CO2 26 08/15/2016   INR 1.24 08/06/2016    PT/INR:  No results for input(s): LABPROT, INR in the last 72 hours. Radiology: No results found.   Assessment/Plan: 1. CV-SR in the 90's. SBP fairly well controlled. Continue Lopressor 25 mg bid. 2. Pulmonary-On room air. Continue Pulmicort and Brovana nebs. 3. GI- C Dif NEGATIVE. Continue  boost, vital supplements, calorie counts.    4. Edema secondary to low albumin. On Lasix 20 mg IV daily 5. DM-CBGs 130/143/116. On Insulin. Will consider restarting Glipizide once tolerating oral better. 6. Leukocytosis-Last WBC decreased to 12,000 . Re check in am.  7 Slowly decrease narcotic  [name] PA-C 08/16/2016 11:18 AM     Patient ID: Pablo Ledger, female   DOB: 1955/12/02, 61 y.o.   MRN: [id number]

## 2016-08-16 NOTE — Progress Notes (Signed)
Patient ID: Haley Graham, female   DOB: 11-25-1955, 61 y.o.   MRN: [id number] EVENING ROUNDS NOTE :     [address]       [place], [address]             [phone]                 10 Days Post-Op Procedure(s) (LRB): THORACIC AORTIC ENDOVASCULAR STENT GRAFT/Thorasic and Abdominal Angiogram, Entravascular ultrasound., Open femoral exposure,Left Brachial access, and patch angioplasty right femoral artery. (N/A)  Total Length of Stay:  LOS: 57 days  BP 135/61   Pulse 93   Temp 98.5 F (36.9 C) (Oral)   Resp 20   Ht 5\' 3"  (1.6 m)   Wt 165 lb 2 oz (74.9 kg)   SpO2 98%   BMI 29.25 kg/m   .Intake/Output      07/14 0701 - 07/15 0700 07/15 0701 - 07/16 0700   P.O. 590 60   I.V. (mL/kg) 102.3 (1.4) 20 (0.3)   IV Piggyback  150   Total Intake(mL/kg) 692.3 (9.2) 230 (3.1)   Urine (mL/kg/hr) 100 (0.1)    Total Output 100     Net +592.3 +230        Urine Occurrence 4 x 4 x   Stool Occurrence 6 x 2 x     . sodium chloride 10 mL/hr at 08/15/16 2146  . lactated ringers Stopped (08/07/16 0900)     Lab Results  Component Value Date   WBC 12.8 (H) 08/16/2016   HGB 9.0 (L) 08/16/2016   HCT 28.1 (L) 08/16/2016   PLT 198 08/16/2016   GLUCOSE 79 08/16/2016   TRIG 159 (H) 08/10/2016   ALT 12 (L) 08/10/2016   AST 14 (L) 08/10/2016   NA 131 (L) 08/16/2016   K 3.2 (L) 08/16/2016   CL 102 08/16/2016   CREATININE 0.99 08/16/2016   BUN 16 08/16/2016   CO2 23 08/16/2016   INR 1.24 08/06/2016   No changes today   [name]  Beeper [phone] Office [phone] 08/16/2016 6:50 PM

## 2016-08-17 ENCOUNTER — Inpatient Hospital Stay (HOSPITAL_COMMUNITY): Payer: BLUE CROSS/BLUE SHIELD

## 2016-08-17 ENCOUNTER — Encounter (HOSPITAL_COMMUNITY): Payer: Self-pay | Admitting: Physical Medicine and Rehabilitation

## 2016-08-17 LAB — GLUCOSE, CAPILLARY
Glucose-Capillary: 103 mg/dL — ABNORMAL HIGH (ref 65–99)
Glucose-Capillary: 76 mg/dL (ref 65–99)
Glucose-Capillary: 78 mg/dL (ref 65–99)
Glucose-Capillary: 84 mg/dL (ref 65–99)
Glucose-Capillary: 93 mg/dL (ref 65–99)
Glucose-Capillary: 99 mg/dL (ref 65–99)

## 2016-08-17 LAB — CBC
HCT: 30.2 % — ABNORMAL LOW (ref 36.0–46.0)
Hemoglobin: 9.7 g/dL — ABNORMAL LOW (ref 12.0–15.0)
MCH: 29.9 pg (ref 26.0–34.0)
MCHC: 32.1 g/dL (ref 30.0–36.0)
MCV: 93.2 fL (ref 78.0–100.0)
Platelets: 214 10*3/uL (ref 150–400)
RBC: 3.24 MIL/uL — ABNORMAL LOW (ref 3.87–5.11)
RDW: 16.2 % — ABNORMAL HIGH (ref 11.5–15.5)
WBC: 15.1 10*3/uL — ABNORMAL HIGH (ref 4.0–10.5)

## 2016-08-17 LAB — BASIC METABOLIC PANEL
Anion gap: 5 (ref 5–15)
BUN: 14 mg/dL (ref 6–20)
CO2: 25 mmol/L (ref 22–32)
Calcium: 7.5 mg/dL — ABNORMAL LOW (ref 8.9–10.3)
Chloride: 102 mmol/L (ref 101–111)
Creatinine, Ser: 0.97 mg/dL (ref 0.44–1.00)
GFR calc Af Amer: >60 mL/min (ref >60)
GFR calc non Af Amer: >60 mL/min (ref >60)
Glucose, Bld: 72 mg/dL (ref 65–99)
Potassium: 3.9 mmol/L (ref 3.5–5.1)
Sodium: 132 mmol/L — ABNORMAL LOW (ref 135–145)

## 2016-08-17 MED ORDER — ASPIRIN EC 81 MG PO TBEC
81.0000 mg | DELAYED_RELEASE_TABLET | Freq: Every day | ORAL | Status: DC
  Administered 2016-08-17 – 2016-08-22 (×6): 81 mg via ORAL
  Filled 2016-08-17 (×6): qty 1

## 2016-08-17 MED ORDER — VITAL 1.5 CAL PO LIQD
1000.0000 mL | Freq: Every day | ORAL | Status: AC
  Administered 2016-08-17: 1000 mL
  Filled 2016-08-17: qty 1000

## 2016-08-17 MED ORDER — HYDROMORPHONE HCL 1 MG/ML IJ SOLN
1.0000 mg | Freq: Three times a day (TID) | INTRAMUSCULAR | Status: DC | PRN
  Administered 2016-08-17 – 2016-08-19 (×6): 1 mg via INTRAVENOUS
  Filled 2016-08-17 (×6): qty 1

## 2016-08-17 NOTE — Progress Notes (Signed)
  Progress Note    08/17/2016 5:09 AM 11 Days Post-Op  Subjective:  Complains of abdominal pain  Vitals:   08/17/16 0100 08/17/16 0300  BP: (!) 133/49   Pulse: (!) 101   Resp: (!) 24   Temp:  99 F (37.2 C)    Physical Exam: Awake and alert Abdominal exam is soft Palpable left radial, bilateral dp pulses  CBC    Component Value Date/Time   WBC 12.8 (H) 08/16/2016 1100   RBC 3.01 (L) 08/16/2016 1100   HGB 9.0 (L) 08/16/2016 1100   HCT 28.1 (L) 08/16/2016 1100   PLT 198 08/16/2016 1100   MCV 93.4 08/16/2016 1100   MCH 29.9 08/16/2016 1100   MCHC 32.0 08/16/2016 1100   RDW 16.3 (H) 08/16/2016 1100   LYMPHSABS 2.9 08/10/2016 0453   MONOABS 0.9 08/10/2016 0453   EOSABS 0.2 08/10/2016 0453   BASOSABS 0.0 08/10/2016 0453    BMET    Component Value Date/Time   NA 131 (L) 08/16/2016 1218   K 3.2 (L) 08/16/2016 1218   CL 102 08/16/2016 1218   CO2 23 08/16/2016 1218   GLUCOSE 79 08/16/2016 1218   BUN 16 08/16/2016 1218   CREATININE 0.99 08/16/2016 1218   CALCIUM 7.6 (L) 08/16/2016 1218   GFRNONAA >60 08/16/2016 1218   GFRAA >60 08/16/2016 1218    INR    Component Value Date/Time   INR 1.24 08/06/2016 2335     Intake/Output Summary (Last 24 hours) at 08/17/16 0509 Last data filed at 08/17/16 0100  Gross per 24 hour  Intake              559 ml  Output              150 ml  Net              409 ml    Assessment: 61 y.o.femaleis TEVAR for TBAD. Persistent ileus and abdominal pain with benign exam Plan: Continue to encourage ambulation and po in take. Marland Kitchendecreasing narcotic medications.  [place] for rehab from vascular standpoint   Erlene Quan C. Donzetta Matters, MD [facility] Office: [phone] Pager: [phone]  08/17/2016 5:09 AM

## 2016-08-17 NOTE — Care Management Note (Addendum)
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- has NGT in place                  Action/Plan: PTA pt lived at home with spouse- referral for FMLA paperwork needed by daughter- have referred her to her mother's primary care doctor regarding this- CM to follow for d/c needs  Expected Discharge Date:                  Expected Discharge Plan:  [place]  In-House Referral:     Discharge planning Services  CM Consult  Post Acute Care Choice:  Home Health Choice offered to:     DME Arranged:    DME Agency:     HH Arranged:    [place] Agency:     Status of Service:  In process, will continue to follow  If discussed at Long Length of Stay Meetings, dates discussed:  6/5, 6/7, 6/12, 6/14, 6/19, 6/21  Discharge Disposition:   Additional Comments: 08/17/2016  Discussed in LOS 08/18/16 - pt remains appropriate for continued stay.  Tube feeds reinitated overnight however pt not tolerating well - will get IV nausea medications.  08/18/16 Pt remains on IV lasix and PRN IV dilaudid,  persitent complaints of abdominal pain that is limiting activity.  CM spoke with CIR - it is not likely that pt will be deemed appropriate and even less likely that insurance will cover.  CM spoke directly with with pt and mother in law at bedside - mother in law and other family members are planning on staying with pt at discharge (however reiterated to pt that she will need to mostly independent at discharge for family to feel comfortable).  Pt was completely independent from home with husband.  Pt has cortrak however refusing feeds  08/13/16 Discussed in LOS 7/12 - remains appropriate for continued stay.   PT/OT both recommend [facility] - requested orders via physician sticky tab  08/12/16 Elenor Quinones, RN, BSN [phone] 5 days s/p TAEVR.  TPN was reduced to 50 ml/hr yesterday night when TF was initiated (plan is to wean pt off TPN).  Pt is also slowing progressing on liquid diet.  Recommendations currently are for [facility] and DME - orders will be written closer to discharge so arrangements can be made.  Daughter contacted CIR directly to request admittance consideration.  07/23/16- 1000- Kristi Webster RN, CM- pt continues with prolonged ileus and ischemia - on TPA and only sips of clears- plan per notes for possible thoracic aortic endovascular stent later this week.  Per PT recommendations for [facility] f/u - will need orders prior to discharge- CM will continue to follow for d/c needs when medically ready.   07/07/16- 1245- Kristi Webster RN, CM- pt remains- NPO, TNA- per MD note- plan repeat CT abd-pelvis to assess bowel viability- CM to continue to follow.   Maryclare Labrador, RN 08/17/2016, 3:56 PM

## 2016-08-17 NOTE — Progress Notes (Signed)
PT Cancellation Note  Patient Details Name: Haley Graham MRN: [id number] DOB: 1955/09/15   Cancelled Treatment:    Reason Eval/Treat Not Completed: Patient declined, no reason specified Pt declined mobility due to abdominal pain. PT will check on pt later as time allows.    [place] April, PTA Pager: [phone]   08/17/2016, 11:08 AM

## 2016-08-17 NOTE — Consult Note (Addendum)
Physical Medicine and Rehabilitation Consult  Reason for Consult:  Debility Referring Physician: Dr. Prescott Gum.    HPI: Haley Graham is a 61 y.o. 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 History taken from chart review.  She was admitted for BP control and NGT placed with bowel rest. Hospital course significant for ischemic bowel with ileus, N/V and placed on TPN for nutritional support. Conservative care recommended but patient continued to have issues with abdominal pain and  underwent endovascular stent graft repair of descending thoracic aorta with patch angioplasty of right femoral artery on 7/6 by Dr. Donzetta Matters.  Post op ileus resolving and diet has been advanced to soft with poor intake. She continues to be limited by ongoing issues with abdominal pain and debility. Unable to tolerate sitting up in chair for more than 1-2 hours due to back pain yesterday. KUB with progressive gaseous distension with adynamic ileus.  CIR recommended for therapy.     Review of Systems  Constitutional: Positive for malaise/fatigue.  HENT: Negative for hearing loss.   Eyes: Negative for blurred vision and double vision.  Respiratory: Positive for shortness of breath. Negative for cough.   Cardiovascular: Negative for chest pain and palpitations.  Gastrointestinal: Positive for constipation.  Musculoskeletal: Positive for back pain, joint pain and myalgias.  Skin: Negative for rash.  Neurological: Positive for weakness. Negative for dizziness and headaches.  Psychiatric/Behavioral: Positive for depression.  All other systems reviewed and are negative.   Past Medical History:  Diagnosis Date  . Anxiety   . Arthritis   . Diabetes mellitus without complication ([place])   . Gastroparesis   . GERD (gastroesophageal reflux disease)   . Hypertension   . IBS  (irritable bowel syndrome)     Past Surgical History:  Procedure Laterality Date  . ABDOMINAL HYSTERECTOMY    . AORTOGRAM N/A 07/24/2016   Procedure: [facility] AND ABDOMINAL AORTA  ANGIOGRAM;  Surgeon: Waynetta Sandy, MD;  Location: [place];  Service: Vascular;  Laterality: N/A;  . CESAREAN SECTION    . CHOLECYSTECTOMY    . COLONOSCOPY    . COLONOSCOPY N/A 06/10/2016   Procedure: COLONOSCOPY;  Surgeon: Rogene Houston, MD;  Location: [facility];  Service: Endoscopy;  Laterality: N/A;  8:30  . POLYPECTOMY  06/10/2016   Procedure: POLYPECTOMY;  Surgeon: Rogene Houston, MD;  Location: [facility];  Service: Endoscopy;;  multiple colon  . THORACIC AORTIC ENDOVASCULAR STENT GRAFT N/A 08/06/2016   Procedure: THORACIC AORTIC ENDOVASCULAR STENT GRAFT/Thorasic and Abdominal Angiogram, Entravascular ultrasound., Open femoral exposure,Left Brachial access, and patch angioplasty right femoral artery.;  Surgeon: Serafina Mitchell, MD;  Location: [facility];  Service: Vascular;  Laterality: N/A;  . UPPER GASTROINTESTINAL ENDOSCOPY      Family History  Problem Relation Age of Onset  . Cancer Mother   . Stroke Mother   . Heart attack Mother   . Stroke Father       Social History:  Married. Independent PTA. She  reports that she has been smoking Cigarettes-- 1 PPD.  She has a 17.25 pack-year smoking history. She has never used smokeless tobacco. She reports that she does not drink alcohol or use drugs.     Allergies  Allergen Reactions  . Penicillins Rash and Other (See Comments)    PATIENT HAS HAD A  PCN REACTION WITH IMMEDIATE RASH, FACIAL/TONGUE/THROAT SWELLING, SOB, OR LIGHTHEADEDNESS WITH HYPOTENSION:  #  #  #  YES  #  #  #   Has patient had a PCN reaction causing severe rash involving mucus membranes or skin necrosis: no Has patient had a PCN reaction that required hospitalization no Has patient had a PCN reaction occurring within the last 10 years:no If all of the above answers are "NO",  then may proceed with Cephalosporin use.  . Ativan [Lorazepam] Other (See Comments)    Makes pt very confused and more agitated, irritable.  . Codeine Nausea And Vomiting  . Reglan [Metoclopramide] Other (See Comments)    TACHYCARDIA    Medications Prior to Admission  Medication Sig Dispense Refill  . albuterol (PROVENTIL) (2.5 MG/3ML) 0.083% nebulizer solution Take 2.5 mg by nebulization every 6 (six) hours as needed for wheezing or shortness of breath.    Marland Kitchen albuterol (VENTOLIN HFA) 108 (90 Base) MCG/ACT inhaler Inhale 2 puffs into the lungs every 6 (six) hours as needed for wheezing or shortness of breath.     . ALPRAZolam (XANAX) 0.5 MG tablet Take 0.25-0.5 mg by mouth 2 (two) times daily.     Marland Kitchen aspirin EC 81 MG tablet Take 324 mg by mouth once.     . citalopram (CELEXA) 40 MG tablet Take 40 mg by mouth at bedtime.     . dicyclomine (BENTYL) 10 MG capsule Take 1 capsule (10 mg total) by mouth 2 (two) times daily before a meal. (Patient taking differently: Take 10 mg by mouth 2 (two) times daily as needed (IBS symptoms). ) 60 capsule 5  . fluticasone (FLONASE) 50 MCG/ACT nasal spray Place 2 sprays into both nostrils daily as needed for allergies or rhinitis.   11  . glipiZIDE-metformin (METAGLIP) 5-500 MG tablet Take 0.5 tablets by mouth 2 (two) times daily before a meal.    . metoprolol tartrate (LOPRESSOR) 25 MG tablet Take 25 mg by mouth daily.    . ondansetron (ZOFRAN) 4 MG tablet Take 4 mg by mouth 3 (three) times daily as needed for nausea.  1  . pantoprazole (PROTONIX) 40 MG tablet Take 40 mg by mouth daily as needed (acid reflux/ indigestion).     . Probiotic Product (PROBIOTIC PO) Take 1 tablet by mouth at bedtime. Gut Restore Ultimate (probiotic with zinc and vitamin C)    . rosuvastatin (CRESTOR) 10 MG tablet Take 10 mg by mouth at bedtime.       Home: Home Living Family/patient expects to be discharged to:: Private residence Living Arrangements: Spouse/significant  other Available Help at Discharge: Family Type of Home: Mobile home Home Access: Stairs to enter Technical brewer of Steps: 6 Entrance Stairs-Rails: Right Home Layout: One level Bathroom Shower/Tub: Chiropodist: Standard Home Equipment: None  Functional History: Prior Function Level of Independence: Independent Comments: Pt watched her 23 y.o. grandson, including taking to school and picking him up.  Independent in the community  Functional Status:  Mobility: Bed Mobility Overal bed mobility: Needs Assistance Bed Mobility: Rolling, Sidelying to Sit, Sit to Sidelying Rolling: Supervision Sidelying to sit: Min assist Supine to sit: Min assist Sit to supine: Min assist Sit to sidelying: Min guard General bed mobility comments: assist to lift trunk and cues for sequencing  Transfers Overall transfer level: Needs assistance Equipment used: Rolling walker (2 wheeled) Transfers: Sit to/from Stand, [employer] Transfers Sit to Stand: Min guard Stand pivot transfers: Min guard General transfer comment: min  guard for safety Ambulation/Gait Ambulation/Gait assistance: Min guard Ambulation Distance (Feet): 220 Feet Assistive device: Rolling walker (2 wheeled) Gait Pattern/deviations: Step-through pattern, Decreased stride length General Gait Details: cues for breathing and encouragement to increase distance and cadence; slow, steady gait Gait velocity: decr Gait velocity interpretation: Below normal speed for age/gender    ADL: ADL Overall ADL's : Needs assistance/impaired Eating/Feeding: Moderate assistance, Bed level Eating/Feeding Details (indicate cue type and reason): due to poor initiation  Grooming: Wash/dry hands, Dance movement psychotherapist, Min guard, Standing Grooming Details (indicate cue type and reason): requires max encouragement to perform  Upper Body Bathing: Maximal assistance, Sitting Lower Body Bathing: Maximal assistance, Sit to/from stand Upper  Body Dressing : Maximal assistance, Sitting Lower Body Dressing: Total assistance, Sit to/from stand Toilet Transfer: Min guard, Stand-pivot, BSC, RW Toileting- Clothing Manipulation and Hygiene: Maximal assistance, Sit to/from stand Functional mobility during ADLs: Min guard, Rolling walker General ADL Comments: Pt with became panicky with entry into bathroom.  She demonstrated increased RR, difficulty focusing, and impaired balance. She stated she felt closed in and unable to stay in bathroom   Cognition: Cognition Overall Cognitive Status: Impaired/Different from baseline Orientation Level: Oriented X4 Cognition Arousal/Alertness: Awake/alert Behavior During Therapy: Anxious, Flat affect Overall Cognitive Status: Impaired/Different from baseline Area of Impairment: Problem solving, Memory Current Attention Level: Sustained, Selective Memory: Decreased short-term memory Following Commands: Follows one step commands consistently, Follows multi-step commands inconsistently Safety/Judgement: Decreased awareness of deficits, Decreased awareness of safety Awareness: Intellectual Problem Solving: Difficulty sequencing, Requires verbal cues, Requires tactile cues General Comments: pt continues to have periods of confusion and emotional at times; pt is very anxious with mobility; self-limiting behavior   Blood pressure (!) 125/43, pulse 91, temperature 97.6 F (36.4 C), temperature source Oral, resp. rate (!) 22, height 5\' 3"  (1.6 m), weight 74.8 kg (164 lb 14.5 oz), SpO2 93 %. Physical Exam  Nursing note and vitals reviewed. Constitutional: She is oriented to person, place, and time. She appears well-developed. She appears distressed.  Fatigued and anxious appearing female with [place] in place.  Obese  HENT:  Head: Normocephalic and atraumatic.  Mouth/Throat: Oropharynx is clear and moist.  Eyes: Pupils are equal, round, and reactive to light. Conjunctivae are normal. Right eye exhibits  no discharge. Left eye exhibits no discharge.  Neck: Normal range of motion. Neck supple.  Cardiovascular: Normal rate and regular rhythm.   Respiratory: Effort normal and breath sounds normal. No stridor. No respiratory distress. She has no wheezes.  GI: She exhibits distension. Bowel sounds are decreased. There is tenderness.  Abdomen tender to palpation worse RUQ.   Musculoskeletal: She exhibits edema. She exhibits no tenderness.  Neurological: She is alert and oriented to person, place, and time.  Speech clear.  Able to follow simple motor commands.  Motor: 4/5 throughout  Skin: Skin is warm and dry. She is not diaphoretic.  Psychiatric: Her speech is normal. Her mood appears anxious. She exhibits a depressed mood.    Results for orders placed or performed during the hospital encounter of 06/20/16 (from the past 24 hour(s))  Glucose, capillary     Status: None   Collection Time: 08/16/16  5:44 PM  Result Value Ref Range   Glucose-Capillary 70 65 - 99 mg/dL  Glucose, capillary     Status: None   Collection Time: 08/17/16 12:00 AM  Result Value Ref Range   Glucose-Capillary 76 65 - 99 mg/dL   Comment 1 Capillary Specimen   Glucose, capillary  Status: None   Collection Time: 08/17/16  6:04 AM  Result Value Ref Range   Glucose-Capillary 78 65 - 99 mg/dL   Comment 1 Capillary Specimen   CBC     Status: Abnormal   Collection Time: 08/17/16  7:51 AM  Result Value Ref Range   WBC 15.1 (H) 4.0 - 10.5 K/uL   RBC 3.24 (L) 3.87 - 5.11 MIL/uL   Hemoglobin 9.7 (L) 12.0 - 15.0 g/dL   HCT 30.2 (L) 36.0 - 46.0 %   MCV 93.2 78.0 - 100.0 fL   MCH 29.9 26.0 - 34.0 pg   MCHC 32.1 30.0 - 36.0 g/dL   RDW 16.2 (H) 11.5 - 15.5 %   Platelets 214 150 - 400 K/uL  Basic metabolic panel     Status: Abnormal   Collection Time: 08/17/16  7:51 AM  Result Value Ref Range   Sodium 132 (L) 135 - 145 mmol/L   Potassium 3.9 3.5 - 5.1 mmol/L   Chloride 102 101 - 111 mmol/L   CO2 25 22 - 32 mmol/L    Glucose, Bld 72 65 - 99 mg/dL   BUN 14 6 - 20 mg/dL   Creatinine, Ser 0.97 0.44 - 1.00 mg/dL   Calcium 7.5 (L) 8.9 - 10.3 mg/dL   GFR calc non Af Amer >60 >60 mL/min   GFR calc Af Amer >60 >60 mL/min   Anion gap 5 5 - 15   Dg Abd Portable 1v  Result Date: 08/17/2016 CLINICAL DATA:  Abdominal pain EXAM: PORTABLE ABDOMEN - 1 VIEW COMPARISON:  Two days ago FINDINGS: Progressive diffuse gaseous distension of small and large bowel. Feeding tube with tip at the distal duodenum. No concerning mass effect or gas collection. Cholecystectomy clips. IMPRESSION: 1. Adynamic ileus pattern with increased gaseous distension. 2. Stable feeding tube with tip at the distal duodenum. Electronically Signed   By: Monte Fantasia M.D.   On: 08/17/2016 12:47    Assessment/Plan: Diagnosis: Debility Labs independently reviewed.  Records reviewed and summated above.  1. Does the need for close, 24 hr/day medical supervision in concert with the patient's rehab needs make it unreasonable for this patient to be served in a less intensive setting? Potentially  2. Co-Morbidities requiring supervision/potential complications: HTN (monitor and provide prns in accordance with increased physical exertion and pain), T2 DM (Monitor in accordance with exercise and adjust meds as necessary), gastroparesis (meds as needed), anxiety (ensure anxiety and resulting apprehension do not limit functional progress; consider prn medications if warranted), ileus (Meds, bowel rest, back pain (Biofeedback training with therapies to help reduce reliance on opiate pain medications, monitor pain control during therapies, and sedation at rest and titrate to maximum efficacy to ensure participation and gains in therapies), Tachycardia (monitor in accordance with pain and increasing activity), tachypnea (monitor RR and O2 Sats with increased physical exertion), hyponatremia (cont to monitor, treat as necessary), leukocytosis (cont to monitor for signs and  symptoms of infection, further workup if indicated), ABLA (transfuse if necessary to ensure appropriate perfusion for increased activity tolerance) 3. Due to bowel management, safety, skin/wound care, disease management, pain management and patient education, does the patient require 24 hr/day rehab nursing? Yes 4. Does the patient require coordinated care of a physician, rehab nurse, PT (1-2 hrs/day, 5 days/week) and OT (1-2 hrs/day, 5 days/week) to address physical and functional deficits in the context of the above medical diagnosis(es)? Yes Addressing deficits in the following areas: balance, endurance, locomotion, strength, transferring, bowel/bladder control, bathing,  dressing, toileting and psychosocial support 5. Can the patient actively participate in an intensive therapy program of at least 3 hrs of therapy per day at least 5 days per week? Potentially 6. The potential for patient to make measurable gains while on inpatient rehab is good 7. Anticipated functional outcomes upon discharge from inpatient rehab are modified independent  with PT, modified independent with OT, n/a with SLP. 8. Estimated rehab length of stay to reach the above functional goals is: 3-7 days. 9. Anticipated D/C setting: Home 10. Anticipated post D/C treatments: HH therapy and Home excercise program 11. Overall Rehab/Functional Prognosis: good  RECOMMENDATIONS: This patient's condition is appropriate for continued rehabilitative care in the following setting: Will cont to follow pt as pt appears to do functionally well when motivated and not in pain.  Will cont to follow and assess need for CIR, but assuming pt will cont to make functional gain. Patient has agreed to participate in recommended program. Potentially Note that insurance prior authorization may be required for reimbursement for recommended care.  Comment: Rehab Admissions Coordinator to follow up.  Delice Lesch, MD, [address],  [place] 08/17/2016

## 2016-08-17 NOTE — Significant Event (Signed)
Patient continues to refuse all forms of physical activity as of now at 1355pm, refusing to sit in the recliner chair, or to ambulate. Patient sat in the chair about 15 minutes this morning and then gotten herself back in bed on her own. Patient requesting only for pain medicines and nausea medicine. But it is not yet time to be given. Was refusing to use BSC or the bathroom, and insisting only using the bedpan for toileting needs. Staff have removed all bedpans in room and have assisted patient to using BSC. Staff encouraging patient to mobilize and to eat. Patient verbally agreeing but does not follow through. Patient is verbally aggressive towards RN when RN tried to mobilize patient.    Haley Graham

## 2016-08-17 NOTE — Progress Notes (Signed)
11 Days Post-Op Procedure(s) (LRB): THORACIC AORTIC ENDOVASCULAR STENT GRAFT/Thorasic and Abdominal Angiogram, Entravascular ultrasound., Open femoral exposure,Left Brachial access, and patch angioplasty right femoral artery. (N/A) Subjective: Chronic abdominal symptoms with poor oral intake Patient not meeting caloric requirements so will resume nocturnal tube feeds Patient walk 300 feet today with her nurse Patient had a formed bowel movement. KUB today shows feeding tube in good position, mild-moderate dilatation of loops of bowel remain stable Laboratory values today are satisfactory Objective: Vital signs in last 24 hours: Temp:  [97.6 F (36.4 C)-99.4 F (37.4 C)] 97.6 F (36.4 C) (07/16 1953) Pulse Rate:  [84-103] 89 (07/16 1900) Cardiac Rhythm: Normal sinus rhythm (07/16 0745) Resp:  [17-26] 21 (07/16 1900) BP: (111-144)/(39-66) 121/44 (07/16 1900) SpO2:  [91 %-98 %] 93 % (07/16 1900) Weight:  [164 lb 14.5 oz (74.8 kg)] 164 lb 14.5 oz (74.8 kg) (07/16 0500)  Hemodynamic parameters for last 24 hours:  afebrile  Intake/Output from previous day: 07/15 0701 - 07/16 0700 In: 629 [P.O.:219; I.V.:260; IV Piggyback:150] Out: 300 [Urine:300] Intake/Output this shift: No intake/output data recorded.  Patient appears depressed, anxious Abdomen mildly distended but nontender bowel sounds slightly high-pitched Extremities warm well perfused  Lab Results:  Recent Labs  08/16/16 1100 08/17/16 0751  WBC 12.8* 15.1*  HGB 9.0* 9.7*  HCT 28.1* 30.2*  PLT 198 214   BMET:  Recent Labs  08/16/16 1218 08/17/16 0751  NA 131* 132*  K 3.2* 3.9  CL 102 102  CO2 23 25  GLUCOSE 79 72  BUN 16 14  CREATININE 0.99 0.97  CALCIUM 7.6* 7.5*    PT/INR: No results for input(s): LABPROT, INR in the last 72 hours. ABG    Component Value Date/Time   PHART 7.471 (H) 08/06/2016 1830   HCO3 28.3 (H) 08/06/2016 1830   TCO2 29 08/06/2016 1830   ACIDBASEDEF 3.0 (H) 06/28/2016 1615   O2SAT 100.0 08/06/2016 1830   CBG (last 3)   Recent Labs  08/17/16 0604 08/17/16 1241 08/17/16 1825  GLUCAP 78 99 93    Assessment/Plan: S/P Procedure(s) (LRB): THORACIC AORTIC ENDOVASCULAR STENT GRAFT/Thorasic and Abdominal Angiogram, Entravascular ultrasound., Open femoral exposure,Left Brachial access, and patch angioplasty right femoral artery. (N/A) Start nocturnal tube feeds with vital 30 cc/h 7P to 7A Appreciate  evaluation by inpatient rehabilitation service  LOS: 58 days    [name] 08/17/2016

## 2016-08-17 NOTE — Progress Notes (Signed)
Nutrition Follow-up  DOCUMENTATION CODES:   Obesity unspecified  INTERVENTION:   Recommend: Vital 1.5 @ 70 ml/hr x 12 hours (provides: 1560 kcal (78% of needs), 101 grams protein (92% of needs), and 641 ml free water.   Continue: Encourage PO intake Mighty Shake II TID with meals, each supplement provides 480-500 kcals and 20-23 grams of protein    NUTRITION DIAGNOSIS:   Inadequate oral intake related to altered GI function as evidenced by meal completion < 50%. Ongoing.   GOAL:   Patient will meet greater than or equal to 90% of their needs Not met.   MONITOR:   PO intake, Supplement acceptance, Diet advancement, I & O's  ASSESSMENT:   Pt with acute aortic dissection originating at the proximal descending thoracic aorta. Pt admitted for pain control, blood pressure control, bed rest and IV hydration.   Cortrak tube in but pt refusing nocturnal TF Pt admits that she is not eating. Only a few bites and sips. Mighty shake II at bedside untouched. Pt complaining of nausea.  RN confirms that pt is not eating.  MD in surgery, RN plan to discuss nutrition plan with MD.   Diet Order:  DIET SOFT Room service appropriate? Yes; Fluid consistency: Thin  Skin:   (MASD perimeum)  Last BM:  7/15  Height:   Ht Readings from Last 1 Encounters:  07/25/16 '5\' 3"'  (1.6 m)    Weight:   Wt Readings from Last 1 Encounters:  08/17/16 164 lb 14.5 oz (74.8 kg)    Ideal Body Weight:  52.2 kg  BMI:  Body mass index is 29.21 kg/m.  Estimated Nutritional Needs:   Kcal:  2000-2200  Protein:  110-120 gm  Fluid:  per MD  EDUCATION NEEDS:   No education needs identified at this time  [place], [place], [place] Pager [phone] After Hours Pager

## 2016-08-18 DIAGNOSIS — I714 Abdominal aortic aneurysm, without rupture, unspecified: Secondary | ICD-10-CM

## 2016-08-18 DIAGNOSIS — E871 Hypo-osmolality and hyponatremia: Secondary | ICD-10-CM

## 2016-08-18 DIAGNOSIS — K559 Vascular disorder of intestine, unspecified: Secondary | ICD-10-CM

## 2016-08-18 DIAGNOSIS — R109 Unspecified abdominal pain: Secondary | ICD-10-CM

## 2016-08-18 DIAGNOSIS — E669 Obesity, unspecified: Secondary | ICD-10-CM

## 2016-08-18 DIAGNOSIS — R0682 Tachypnea, not elsewhere classified: Secondary | ICD-10-CM

## 2016-08-18 DIAGNOSIS — D62 Acute posthemorrhagic anemia: Secondary | ICD-10-CM

## 2016-08-18 DIAGNOSIS — E1169 Type 2 diabetes mellitus with other specified complication: Secondary | ICD-10-CM

## 2016-08-18 DIAGNOSIS — R1084 Generalized abdominal pain: Secondary | ICD-10-CM

## 2016-08-18 DIAGNOSIS — R000 Tachycardia, unspecified: Secondary | ICD-10-CM

## 2016-08-18 DIAGNOSIS — D72829 Elevated white blood cell count, unspecified: Secondary | ICD-10-CM

## 2016-08-18 DIAGNOSIS — I10 Essential (primary) hypertension: Secondary | ICD-10-CM

## 2016-08-18 DIAGNOSIS — F411 Generalized anxiety disorder: Secondary | ICD-10-CM

## 2016-08-18 LAB — BASIC METABOLIC PANEL
Anion gap: 6 (ref 5–15)
BUN: 16 mg/dL (ref 6–20)
CO2: 24 mmol/L (ref 22–32)
Calcium: 7.4 mg/dL — ABNORMAL LOW (ref 8.9–10.3)
Chloride: 101 mmol/L (ref 101–111)
Creatinine, Ser: 0.92 mg/dL (ref 0.44–1.00)
GFR calc Af Amer: >60 mL/min (ref >60)
GFR calc non Af Amer: >60 mL/min (ref >60)
Glucose, Bld: 132 mg/dL — ABNORMAL HIGH (ref 65–99)
Potassium: 3.8 mmol/L (ref 3.5–5.1)
Sodium: 131 mmol/L — ABNORMAL LOW (ref 135–145)

## 2016-08-18 LAB — CBC
HCT: 30.5 % — ABNORMAL LOW (ref 36.0–46.0)
Hemoglobin: 9.8 g/dL — ABNORMAL LOW (ref 12.0–15.0)
MCH: 29.7 pg (ref 26.0–34.0)
MCHC: 32.1 g/dL (ref 30.0–36.0)
MCV: 92.4 fL (ref 78.0–100.0)
Platelets: 233 10*3/uL (ref 150–400)
RBC: 3.3 MIL/uL — ABNORMAL LOW (ref 3.87–5.11)
RDW: 15.8 % — ABNORMAL HIGH (ref 11.5–15.5)
WBC: 15.3 10*3/uL — ABNORMAL HIGH (ref 4.0–10.5)

## 2016-08-18 LAB — GLUCOSE, CAPILLARY
Glucose-Capillary: 124 mg/dL — ABNORMAL HIGH (ref 65–99)
Glucose-Capillary: 111 mg/dL — ABNORMAL HIGH (ref 65–99)
Glucose-Capillary: 127 mg/dL — ABNORMAL HIGH (ref 65–99)
Glucose-Capillary: 97 mg/dL (ref 65–99)

## 2016-08-18 MED ORDER — VITAL 1.5 CAL PO LIQD: 1000.0000 mL | ORAL | Status: DC

## 2016-08-18 MED ORDER — ONDANSETRON HCL 4 MG/2ML IJ SOLN: 4.0000 mg | Freq: Four times a day (QID) | INTRAMUSCULAR | Status: DC | PRN

## 2016-08-18 MED ORDER — METOCLOPRAMIDE HCL 5 MG/ML IJ SOLN
10.0000 mg | Freq: Four times a day (QID) | INTRAMUSCULAR | Status: AC
  Administered 2016-08-18 (×2): 10 mg via INTRAVENOUS
  Filled 2016-08-18 (×2): qty 2

## 2016-08-18 MED ORDER — VITAL 1.5 CAL PO LIQD
1000.0000 mL | ORAL | Status: DC
  Administered 2016-08-18: 1000 mL
  Filled 2016-08-18 (×2): qty 1000

## 2016-08-18 MED ORDER — PROMETHAZINE HCL 25 MG/ML IJ SOLN
6.2500 mg | Freq: Four times a day (QID) | INTRAMUSCULAR | Status: DC | PRN
  Administered 2016-08-18: 6.25 mg via INTRAVENOUS
  Filled 2016-08-18: qty 1

## 2016-08-18 NOTE — Progress Notes (Signed)
12 Days Post-Op Procedure(s) (LRB): THORACIC AORTIC ENDOVASCULAR STENT GRAFT/Thorasic and Abdominal Angiogram, Entravascular ultrasound., Open femoral exposure,Left Brachial access, and patch angioplasty right femoral artery. (N/A) Subjective: Received tube feeds overnite but now nauseated, gagging this am No change on exam Will give prn phenergan in addition to zofran Pain meds not to be increased Labs satisfactory Objective: Vital signs in last 24 hours: Temp:  [97.4 F (36.3 C)-98.8 F (37.1 C)] 98.8 F (37.1 C) (07/17 0300) Pulse Rate:  [84-95] 92 (07/17 0600) Cardiac Rhythm: Normal sinus rhythm (07/17 0000) Resp:  [17-26] 18 (07/17 0600) BP: (111-136)/(39-56) 122/53 (07/17 0600) SpO2:  [90 %-98 %] 94 % (07/17 0749) Weight:  [164 lb 7.4 oz (74.6 kg)] 164 lb 7.4 oz (74.6 kg) (07/17 0338)  Hemodynamic parameters for last 24 hours:  stable  Intake/Output from previous day: 07/16 0701 - 07/17 0700 In: 538 [P.O.:180; I.V.:20; NG/GT:338] Out: 0  Intake/Output this shift: No intake/output data recorded.  Anxious Abdomen nontender Mild edema Lab Results:  Recent Labs  08/17/16 0751 08/18/16 0540  WBC 15.1* 15.3*  HGB 9.7* 9.8*  HCT 30.2* 30.5*  PLT 214 233   BMET:  Recent Labs  08/17/16 0751 08/18/16 0540  NA 132* 131*  K 3.9 3.8  CL 102 101  CO2 25 24  GLUCOSE 72 132*  BUN 14 16  CREATININE 0.97 0.92  CALCIUM 7.5* 7.4*    PT/INR: No results for input(s): LABPROT, INR in the last 72 hours. ABG    Component Value Date/Time   PHART 7.471 (H) 08/06/2016 1830   HCO3 28.3 (H) 08/06/2016 1830   TCO2 29 08/06/2016 1830   ACIDBASEDEF 3.0 (H) 06/28/2016 1615   O2SAT 100.0 08/06/2016 1830   CBG (last 3)   Recent Labs  08/17/16 2344 08/18/16 0339 08/18/16 0715  GLUCAP 103* 124* 111*    Assessment/Plan: S/P Procedure(s) (LRB): THORACIC AORTIC ENDOVASCULAR STENT GRAFT/Thorasic and Abdominal Angiogram, Entravascular ultrasound., Open femoral  exposure,Left Brachial access, and patch angioplasty right femoral artery. (N/A) CIR eval pending Mobility to chair TF at night  LOS: 59 days    [name] 08/18/2016

## 2016-08-18 NOTE — Progress Notes (Addendum)
Progress Note  SUBJECTIVE:    Needs something for cramping. Also feeling nauseous.  OBJECTIVE:   Vitals:   08/18/16 0500 08/18/16 0600  BP: (!) 123/45 (!) 122/53  Pulse: 91 92  Resp: 17 18  Temp:      Intake/Output Summary (Last 24 hours) at 08/18/16 0727 Last data filed at 08/18/16 0700  Gross per 24 hour  Intake              538 ml  Output                0 ml  Net              538 ml   Abdomen with mild distension, but soft. Mild tenderness to right quadrants.  Right groin incision c/d/i 2+ DP pulses bilaterally  ASSESSMENT/PLAN:   61 y.o. female is s/p: TEVAR for TBAD. Persistent ileus and abdominal pain.  12 Days Post-Op   Continuing to have abdominal pain. KUB from yesterday showing consistent ileus. Nocturnal tube feeds resumed given poor oral intake.  Having bowel movements daily.  Encouraged her to get out of bed and walk.  Hopefully will be candidate for CIR.   Haley Graham 08/18/2016 7:27 AM -- LABS:   CBC    Component Value Date/Time   WBC 15.3 (H) 08/18/2016 0540   HGB 9.8 (L) 08/18/2016 0540   HCT 30.5 (L) 08/18/2016 0540   PLT 233 08/18/2016 0540    BMET    Component Value Date/Time   NA 131 (L) 08/18/2016 0540   K 3.8 08/18/2016 0540   CL 101 08/18/2016 0540   CO2 24 08/18/2016 0540   GLUCOSE 132 (H) 08/18/2016 0540   BUN 16 08/18/2016 0540   CREATININE 0.92 08/18/2016 0540   CALCIUM 7.4 (L) 08/18/2016 0540   GFRNONAA >60 08/18/2016 0540   GFRAA >60 08/18/2016 0540    COAG Lab Results  Component Value Date   INR 1.24 08/06/2016   INR 1.08 07/29/2016   INR 1.72 07/19/2016   No results found for: PTT  ANTIBIOTICS:   Anti-infectives    Start     Dose/Rate Route Frequency Ordered Stop   08/06/16 1400  vancomycin (VANCOCIN) IVPB 1000 mg/200 mL premix     1,000 mg 200 mL/hr over 60 Minutes Intravenous To [facility] 08/06/16 0038 08/06/16 1814   08/04/16 1100  ceFEPIme (MAXIPIME) 1 g in dextrose 5 % 50 mL IVPB       1 g 100 mL/hr over 30 Minutes Intravenous Every 12 hours 08/04/16 1049 08/11/16 2359   07/27/16 1400  meropenem (MERREM) 1 g in sodium chloride 0.9 % 100 mL IVPB     1 g 200 mL/hr over 30 Minutes Intravenous Every 8 hours 07/27/16 0849 07/29/16 2143   07/25/16 2200  meropenem (MERREM) 1 g in sodium chloride 0.9 % 100 mL IVPB  Status:  Discontinued     1 g 200 mL/hr over 30 Minutes Intravenous Every 12 hours 07/25/16 1223 07/27/16 0849   07/23/16 1000  ceFEPIme (MAXIPIME) 1 g in dextrose 5 % 50 mL IVPB  Status:  Discontinued     1 g 100 mL/hr over 30 Minutes Intravenous Every 8 hours 07/23/16 0911 07/23/16 0917   07/23/16 1000  meropenem (MERREM) 1 g in sodium chloride 0.9 % 100 mL IVPB  Status:  Discontinued     1 g 200 mL/hr over 30 Minutes Intravenous Every 8 hours 07/23/16 0919 07/25/16 1223   07/18/16 0000  levofloxacin (LEVAQUIN) IVPB 500 mg  Status:  Discontinued     500 mg 100 mL/hr over 60 Minutes Intravenous Every 24 hours 07/17/16 0903 07/17/16 0938   07/17/16 1100  vancomycin (VANCOCIN) 1,500 mg in sodium chloride 0.9 % 500 mL IVPB     1,500 mg 250 mL/hr over 120 Minutes Intravenous Every 24 hours 07/17/16 0946 07/23/16 1221   07/17/16 1000  levofloxacin (LEVAQUIN) tablet 500 mg  Status:  Discontinued     500 mg Oral Daily 07/17/16 0851 07/17/16 0903   07/15/16 0900  ciprofloxacin (CIPRO) tablet 500 mg  Status:  Discontinued     500 mg Oral 2 times daily 07/15/16 0828 07/17/16 0851   07/14/16 1115  levofloxacin (LEVAQUIN) tablet 500 mg  Status:  Discontinued     500 mg Oral Daily 07/14/16 1107 07/14/16 1214   06/26/16 0830  meropenem (MERREM) 1 g in sodium chloride 0.9 % 100 mL IVPB  Status:  Discontinued     1 g 200 mL/hr over 30 Minutes Intravenous Every 8 hours 06/26/16 0810 07/06/16 0804   06/22/16 1800  clindamycin (CLEOCIN) IVPB 600 mg  Status:  Discontinued     600 mg 100 mL/hr over 30 Minutes Intravenous Every 8 hours 06/22/16 1723 06/26/16 0810   06/22/16 1000   azithromycin (ZITHROMAX) 250 mg in dextrose 5 % 125 mL IVPB  Status:  Discontinued     250 mg 125 mL/hr over 60 Minutes Intravenous Every 24 hours 06/22/16 0826 06/27/16 1019   06/22/16 0900  levofloxacin (LEVAQUIN) IVPB 500 mg  Status:  Discontinued     500 mg 100 mL/hr over 60 Minutes Intravenous Every 24 hours 06/22/16 0759 06/22/16 [place], PA-C [facility] Office: [phone] Pager: [phone] 08/18/2016 7:27 AM   I have independently interviewed patient and agree with PA assessment and plan above. Her kub continues to demonstrate significant ileus. CIR evaluating.   Ebon Ketchum C. Donzetta Matters, MD [facility] [place] Office: [phone] Pager: [phone]

## 2016-08-18 NOTE — Progress Notes (Signed)
I met with pt at bedside. We discussed the goals of an inpt rehab admission and expectations which include participation in three hours per day of rehab to assist her in returning home. When pt is willing to participate with therapy and nursing mobilization, she ambulates at min guard assist level with a RW. Therapy reports she is self limiting. Pt currently requesting use of bedpan, not bedside commode and wants to remain in bed, not get up to chair. Pt not at a level to participate in the intensity of inpt rehab therefore I am unable to pursue [facility] approval. Therapy all continue to recommend [facility]. At pt's current level of participation, pt will need SNF level rehab if family feels unable to care for her at current functional level. I will follow . [phone]

## 2016-08-18 NOTE — Progress Notes (Addendum)
Physical Therapy Treatment Patient Details Name: Haley Graham MRN: [id number] DOB: Jan 06, 1956 Today's Date: 08/18/2016    History of Present Illness Pt adm with type B aortic dissection. Treated non surgically with BP control and pain control. Pt developed pancreatitis and ileus with NG tube placed. TEVAR procedure 08/06/16. PMH - anxiety, arthritis    PT Comments    Pt overall required min guard/min A for all mobility. Pt continues to demonstrate behavioral issues and required +2 for safety with OOB mobility this session due to intermittent knee buckling, pt closing eyes, and taking hands off of RW.  Patient continues to be self limiting. PT will continue to follow and progress as able.    Follow Up Recommendations  Home health PT;Supervision/Assistance - 24 hour     Equipment Recommendations  Rolling walker with 5" wheels    Recommendations for Other Services       Precautions / Restrictions Precautions Precautions: Fall    Mobility  Bed Mobility Overal bed mobility: Needs Assistance         Sit to supine: Min guard   General bed mobility comments: min guard for safety; pt able to get in/out of bed without physical assist   Transfers Overall transfer level: Needs assistance Equipment used: Rolling walker (2 wheeled) Transfers: Sit to/from [facility] Sit to Stand: Min guard;+2 safety/equipment Stand pivot transfers: Min guard;+2 safety/equipment       General transfer comment: pt required assistance for safety and max encouragement to participate in OOB activity  Ambulation/Gait Ambulation/Gait assistance: Min guard;+2 safety/equipment;Min assist Ambulation Distance (Feet): 380 Feet Assistive device: Rolling walker (2 wheeled) Gait Pattern/deviations: Step-through pattern;Decreased stride length   Gait velocity interpretation: Below normal speed for age/gender General Gait Details: pt very guarded and anxious; max encouragement to continue  ambulating and for breathing; initially when ambulating in room pt began stumbling and hands falling off of RW and pt closing eyes; when provided with cues that she is okay and she can "do this" pt stopped this behavior   Stairs            Wheelchair Mobility    Modified Rankin (Stroke Patients Only)       Balance Overall balance assessment: Needs assistance Sitting-balance support: No upper extremity supported;Feet supported Sitting balance-Leahy Scale: Good     Standing balance support: During functional activity;No upper extremity supported Standing balance-Leahy Scale: Fair                              Cognition Arousal/Alertness: Awake/alert Behavior During Therapy: Anxious;Flat affect Overall Cognitive Status: Impaired/Different from baseline Area of Impairment: Problem solving;Memory;Attention                   Current Attention Level: Selective           General Comments: Pt is very anxious at times and fixated on needing pain medications, feel these behaviors are causing the above deficits       Exercises      General Comments General comments (skin integrity, edema, etc.): Nsg. present througout.  OT saw pt x 2 visits.  Established  daily goals/activities with pt.  These were posted on her wall in large print, with date, and her signature outlining the activities she needs to do daily to get stronger to go home, and to which she agrees to perform daily.         Pertinent Vitals/Pain Pain Assessment:  0-10 Pain Score: 8  Pain Location: back and abdomen Pain Descriptors / Indicators: Grimacing;Guarding Pain Intervention(s): Premedicated before session;Patient requesting pain meds-RN notified;Monitored during session;Repositioned    Home Living                      Prior Function            PT Goals (current goals can now be found in the care plan section) Progress towards PT goals: Progressing toward goals     Frequency    Min 3X/week      PT Plan Current plan remains appropriate    Co-evaluation PT/OT/SLP Co-Evaluation/Treatment: Yes Reason for Co-Treatment: For patient/therapist safety;Necessary to address cognition/behavior during functional activity PT goals addressed during session: Mobility/safety with mobility OT goals addressed during session: ADL's and self-care      AM-PAC PT "6 Clicks" Daily Activity  Outcome Measure  Difficulty turning over in bed (including adjusting bedclothes, sheets and blankets)?: A Little Difficulty moving from lying on back to sitting on the side of the bed? : Total Difficulty sitting down on and standing up from a chair with arms (e.g., wheelchair, bedside commode, etc,.)?: A Little Help needed moving to and from a bed to chair (including a wheelchair)?: A Little Help needed walking in hospital room?: A Little Help needed climbing 3-5 steps with a railing? : A Lot 6 Click Score: 15    End of Session Equipment Utilized During Treatment: Gait belt Activity Tolerance: Patient tolerated treatment well Patient left: with call bell/phone within reach;with nursing/sitter in room (RN and OT present with pt on [facility]) Nurse Communication: Mobility status PT Visit Diagnosis: Muscle weakness (generalized) (M62.81);Other abnormalities of gait and mobility (R26.89)     Time: 1740-8144 PT Time Calculation (min) (ACUTE ONLY): 33 min  Charges:  $Gait Training: 8-22 mins                    G Codes:       Earney Navy, PTA Pager: [phone]     Darliss Cheney 08/18/2016, 3:40 PM

## 2016-08-18 NOTE — Progress Notes (Signed)
Occupational Therapy Treatment Patient Details Name: Haley Graham MRN: [id number] DOB: 28-Mar-1955 Today's Date: 08/18/2016    History of present illness Pt adm with type B aortic dissection. Treated non surgically with BP control and pain control. Pt developed pancreatitis and ileus with NG tube placed. TEVAR procedure 08/06/16. PMH - anxiety, arthritis   OT comments  Pt seen x 2 by OT.  Once with PT to encourage ambulation as +2 needed this date for safety as pt with significant behavioral issues, and periodically buckling knees.  Second session focused on ADLs - requires min - mod A, and max encouragement.  She is physically able to perform these activities with supervision - occasional min A, however, pt with poor effort and self limiting behaviors.  Established daily goals/activities that she needs to perform/engage in in order to go home, and to which she is agreeable.  These were signed by her, and posted in her room.  She adamantly refused to sit in recliner despite max attempts.   Will continue to follow.     Follow Up Recommendations  Home health OT;Supervision/Assistance - 24 hour    Equipment Recommendations  3 in 1 bedside commode;Tub/shower bench    Recommendations for Other Services      Precautions / Restrictions Precautions Precautions: Fall       Mobility Bed Mobility Overal bed mobility: Needs Assistance         Sit to supine: Min guard      Transfers Overall transfer level: Needs assistance Equipment used: Rolling walker (2 wheeled) Transfers: Sit to/from [facility] Sit to Stand: Min guard;+2 safety/equipment Stand pivot transfers: Min guard;+2 safety/equipment       General transfer comment: Pt required max encouragment and bargaining to participate.  She initially was bouncing and bucklint kness, with hands falling off walker - assist provided for safety, however, when encouragement provided, and pt coached that she can do this task,  these behaviors ceased until she was fatigued and wanting pain meds, and then behaviors began again.       Balance Overall balance assessment: Needs assistance Sitting-balance support: No upper extremity supported;Feet supported Sitting balance-Leahy Scale: Good     Standing balance support: During functional activity;No upper extremity supported Standing balance-Leahy Scale: Fair                             ADL either performed or assessed with clinical judgement   ADL Overall ADL's : Needs assistance/impaired Eating/Feeding: Set up;Bed level   Grooming: Wash/dry hands;Wash/dry face;Oral care;Brushing hair;Minimal assistance;Sitting Grooming Details (indicate cue type and reason): requires min A for thoroughness and maximal encouragement for participation  Upper Body Bathing: Moderate assistance;Sitting Upper Body Bathing Details (indicate cue type and reason): assist for thoroughness, feel she can perform without assist, but she reports she is fatigued and in pain.  Max encouragement for participation  Lower Body Bathing: Moderate assistance;Sit to/from stand Lower Body Bathing Details (indicate cue type and reason): assist for thoroughness  Upper Body Dressing : Moderate assistance;Sitting       Toilet Transfer: Minimal assistance;Ambulation;+2 for safety/equipment;BSC;RW Toilet Transfer Details (indicate cue type and reason): Pt bouncing and buckling her knees intermittently requiring +2 assist for safety.  When pt instructed that she can maintain her balance, and she has control of her body, these behaviors stopped  Toileting- Clothing Manipulation and Hygiene: Minimal assistance;Sit to/from stand Toileting - Clothing Manipulation Details (indicate cue type and reason):  min A for thoroughness      Functional mobility during ADLs: Minimal assistance;Min guard;+2 for safety/equipment;Rolling walker General ADL Comments: Pt required max encouragement for  all aspects  of participation.  She has frequent excuses of why she can't perform an activity, but will then do it with prompting.       Vision       Perception     Praxis      Cognition Arousal/Alertness: Awake/alert Behavior During Therapy: Anxious;Flat affect Overall Cognitive Status: Impaired/Different from baseline Area of Impairment: Problem solving;Memory;Attention                   Current Attention Level: Selective           General Comments: Pt is very anxious at times and fixated on needing pain medications, feel these behaviors are causing the above deficits         Exercises     Shoulder Instructions       General Comments Nsg. present througout.  OT saw pt x 2 visits.  Established  daily goals/activities with pt.  These were posted on her wall in large print, with date, and her signature outlining the activities she needs to do daily to get stronger to go home, and to which she agrees to perform daily.       Pertinent Vitals/ Pain       Pain Assessment: 0-10 Pain Score: 8  Pain Location: back and abdomen Pain Descriptors / Indicators: Grimacing;Guarding Pain Intervention(s): Patient requesting pain meds-RN notified;Premedicated before session;Monitored during session;Repositioned  Home Living                                          Prior Functioning/Environment              Frequency  Min 2X/week        Progress Toward Goals  OT Goals(current goals can now be found in the care plan section)  Progress towards OT goals: Progressing toward goals     Plan Discharge plan remains appropriate    Co-evaluation    PT/OT/SLP Co-Evaluation/Treatment: Yes Reason for Co-Treatment: For patient/therapist safety;Necessary to address cognition/behavior during functional activity   OT goals addressed during session: ADL's and self-care      AM-PAC PT "6 Clicks" Daily Activity     Outcome Measure   Help from another person eating  meals?: A Little Help from another person taking care of personal grooming?: A Little Help from another person toileting, which includes using toliet, bedpan, or urinal?: A Little Help from another person bathing (including washing, rinsing, drying)?: A Lot Help from another person to put on and taking off regular upper body clothing?: A Lot Help from another person to put on and taking off regular lower body clothing?: A Lot 6 Click Score: 15    End of Session Equipment Utilized During Treatment: Gait belt;Rolling walker  OT Visit Diagnosis: Unsteadiness on feet (R26.81);Pain Pain - part of body:  (abdomen and back )   Activity Tolerance Other (comment);Patient limited by fatigue;Patient limited by pain (self limiting behaviors )   Patient Left in bed;with call bell/phone within reach;with bed alarm set   Nurse Communication Mobility status;Patient requests pain meds        Time: 1217 - 1246 and 1300-1334 OT Time Calculation (min):  29 mins and 34 min  Charges: OT General  Charges $OT Visit: 2 Procedure OT Treatments $Self Care/Home Management : 23-37 mins $Therapeutic Activity: 8-22 mins  [facility], OTR/L [phone]    Lucille Passy M 08/18/2016, 2:50 PM

## 2016-08-18 NOTE — Progress Notes (Signed)
CT surgery p.m. Rounds  Patient stable with complaining of cramping abdominal pain Walked 3 times in the hallway and was out of bed and chair Some diarrhea today with minimal oral intake Will resume nocturnal tube feeds at 30 cc/h Inpatient rehabilitation evaluation demonstrated patient not a level to participate in benefit from inpatient rehabilitation Patient ready for transfer to stepdown with goal of skilled nursing facility rehabilitation

## 2016-08-18 NOTE — Progress Notes (Signed)
Nutrition Follow-up  DOCUMENTATION CODES:   Obesity unspecified  INTERVENTION:   Current TF does not meet nutrition needs  Recommend Vital 1.5 @ 75 ml/hr x 12 hours  60 ml Prostat BID Provides: 1750 kcal (88% of needs), 120 grams protein, and 687 ml free water.   NUTRITION DIAGNOSIS:   Inadequate oral intake related to altered GI function as evidenced by meal completion < 50%. Ongoing.   GOAL:   Patient will meet greater than or equal to 90% of their needs Progressing.   MONITOR:   PO intake, Supplement acceptance, Diet advancement, I & O's  ASSESSMENT:   Pt with acute aortic dissection originating at the proximal descending thoracic aorta. Pt admitted for pain control, blood pressure control, bed rest and IV hydration.   Nocturnal TF restarted last PM Vital 1.5 @ 30 ml/hr x 12 hours (Provides: 540 kcal, 24 grams protein, and 275 ml free water  Per RN pt was nauseous and vomiting this am Per discussion with RN and chart review pt is self limited with mobility and nutrition.  Pt is not eating meals and not taking supplements TPN was d/c'ed 7/10 Pt refused nocturnal TF over weekend.   Discussed with RN.    Diet Order:  DIET SOFT Room service appropriate? Yes; Fluid consistency: Thin  Skin:   (MASD perimeum)  Last BM:  7/15  Height:   Ht Readings from Last 1 Encounters:  07/25/16 5\' 3"  (1.6 m)    Weight:   Wt Readings from Last 1 Encounters:  08/18/16 164 lb 7.4 oz (74.6 kg)    Ideal Body Weight:  52.2 kg  BMI:  Body mass index is 29.13 kg/m.  Estimated Nutritional Needs:   Kcal:  2000-2200  Protein:  110-120 gm  Fluid:  per MD  EDUCATION NEEDS:   No education needs identified at this time  [place], [place], [place] Pager [phone] After Hours Pager

## 2016-08-19 DIAGNOSIS — I7102 Dissection of abdominal aorta: Secondary | ICD-10-CM

## 2016-08-19 DIAGNOSIS — R110 Nausea: Secondary | ICD-10-CM

## 2016-08-19 LAB — GLUCOSE, CAPILLARY
Glucose-Capillary: 108 mg/dL — ABNORMAL HIGH (ref 65–99)
Glucose-Capillary: 109 mg/dL — ABNORMAL HIGH (ref 65–99)
Glucose-Capillary: 119 mg/dL — ABNORMAL HIGH (ref 65–99)
Glucose-Capillary: 163 mg/dL — ABNORMAL HIGH (ref 65–99)
Glucose-Capillary: 96 mg/dL (ref 65–99)

## 2016-08-19 MED ORDER — PROMETHAZINE HCL 25 MG/ML IJ SOLN
6.2500 mg | Freq: Four times a day (QID) | INTRAMUSCULAR | Status: DC | PRN
  Administered 2016-08-19 – 2016-08-22 (×4): 6.25 mg via INTRAVENOUS
  Filled 2016-08-19 (×4): qty 1

## 2016-08-19 MED ORDER — HYDROMORPHONE HCL 2 MG PO TABS
1.0000 mg | ORAL_TABLET | Freq: Four times a day (QID) | ORAL | Status: DC | PRN
  Administered 2016-08-19 – 2016-08-20 (×5): 1 mg via ORAL
  Filled 2016-08-19 (×5): qty 1

## 2016-08-19 MED ORDER — METOPROLOL TARTRATE 25 MG PO TABS
37.5000 mg | ORAL_TABLET | Freq: Two times a day (BID) | ORAL | Status: DC
  Administered 2016-08-19 – 2016-08-22 (×6): 37.5 mg via ORAL
  Filled 2016-08-19 (×6): qty 1

## 2016-08-19 MED ORDER — HYDROMORPHONE HCL 1 MG/ML PO LIQD: 1.0000 mg | Freq: Four times a day (QID) | ORAL | Status: DC | PRN

## 2016-08-19 MED ORDER — INSULIN GLARGINE 100 UNIT/ML ~~LOC~~ SOLN
10.0000 [IU] | Freq: Every day | SUBCUTANEOUS | Status: DC
  Administered 2016-08-20: 10 [IU] via SUBCUTANEOUS
  Filled 2016-08-19 (×3): qty 0.1

## 2016-08-19 NOTE — Progress Notes (Signed)
Patient ID: Haley Graham, female   DOB: March 03, 1955, 61 y.o.   MRN: [id number] EVENING ROUNDS NOTE :     [address]       [place] [address]             [phone]                 13 Days Post-Op Procedure(s) (LRB): THORACIC AORTIC ENDOVASCULAR STENT GRAFT/Thorasic and Abdominal Angiogram, Entravascular ultrasound., Open femoral exposure,Left Brachial access, and patch angioplasty right femoral artery. (N/A)  Total Length of Stay:  LOS: 60 days  BP (!) 118/50   Pulse 94   Temp (!) 97.5 F (36.4 C) (Oral)   Resp (!) 22   Ht 5\' 3"  (1.6 m)   Wt 167 lb 1.7 oz (75.8 kg)   SpO2 93%   BMI 29.60 kg/m   .Intake/Output      07/17 0701 - 07/18 0700 07/18 0701 - 07/19 0700   P.O. 360 900   NG/GT 360    Total Intake(mL/kg) 720 (9.5) 900 (11.9)   Urine (mL/kg/hr)  600 (0.7)   Stool 4    Total Output 4 600   Net +716 +300        Urine Occurrence 1 x    Stool Occurrence 2 x      . sodium chloride Stopped (08/17/16 0830)  . lactated ringers Stopped (08/07/16 0900)     Lab Results  Component Value Date   WBC 15.3 (H) 08/18/2016   HGB 9.8 (L) 08/18/2016   HCT 30.5 (L) 08/18/2016   PLT 233 08/18/2016   GLUCOSE 132 (H) 08/18/2016   TRIG 159 (H) 08/10/2016   ALT 12 (L) 08/10/2016   AST 14 (L) 08/10/2016   NA 131 (L) 08/18/2016   K 3.8 08/18/2016   CL 101 08/18/2016   CREATININE 0.92 08/18/2016   BUN 16 08/18/2016   CO2 24 08/18/2016   INR 1.24 08/06/2016   Wants more narcotic, has been d/c continued by Dr Darcey Nora , explained this to patient   [name]  Beeper [phone] Office [phone] 08/19/2016 7:00 PM

## 2016-08-19 NOTE — Progress Notes (Signed)
Pt offered multiple times throughout shift to ambulate. Pt refused each time stating she just wanted to rest or she would ambulate when she felt better. RN encourage patient to sit in chair and get out of bed. Pt refused. Will continue to monitor.  Haley Graham Eilee Schader,RN

## 2016-08-19 NOTE — Progress Notes (Signed)
Nutrition Follow-up  DOCUMENTATION CODES:   Obesity unspecified  INTERVENTION:   Nocturnal TF and Cortrak tube d/c'ed as pt complains these are causing her to have abd pain.  Pt not eating and no longer on parenteral nutrition support therefore expect nutrition status to deteriorate unless pt will start eating.   Continue: Mighty Shake II TID with meals, each supplement provides 480-500 kcals and 20-23 grams of protein   NUTRITION DIAGNOSIS:   Inadequate oral intake related to altered GI function as evidenced by meal completion < 50%. Ongoing.   GOAL:   Patient will meet greater than or equal to 90% of their needs Not met.   MONITOR:   PO intake, Supplement acceptance, Diet advancement, I & O's  ASSESSMENT:   Pt with acute aortic dissection originating at the proximal descending thoracic aorta. Pt admitted for pain control, blood pressure control, bed rest and IV hydration.   Cortrak and nocturnal TF d/c'ed today due to pt's complains TF is causing her abd pain.   Pt not eating or consuming oral nutrition supplements. Per discussion with RN pt also refusing ambulation.  Spoke with pt. Pt no longer had Cortrak tube in place but was not aware of this. She states that she want her two nausea medications and to rest. Lots of encouragement provided. Explained that nutrition status would worsen now that she no longer was on any form of nutrition support.  Pt has tried ensure and boost breeze and not willing to drink them Pt has tried magic cup and did not like these Menu reviewed, pt declines any snacks Mighty Shake II still ordered each meal  LTACH eval pending  Diet Order:  DIET SOFT Room service appropriate? Yes; Fluid consistency: Thin  Skin:   (MASD perimeum)  Last BM:  7/17  Height:   Ht Readings from Last 1 Encounters:  07/25/16 '5\' 3"'  (1.6 m)    Weight:   Wt Readings from Last 1 Encounters:  08/19/16 167 lb 1.7 oz (75.8 kg)    Ideal Body Weight:  52.2  kg  BMI:  Body mass index is 29.6 kg/m.  Estimated Nutritional Needs:   Kcal:  2000-2200  Protein:  110-120 gm  Fluid:  per MD  EDUCATION NEEDS:   No education needs identified at this time  [place], [place], [place] Pager [phone] After Hours Pager

## 2016-08-19 NOTE — Progress Notes (Addendum)
TCTS DAILY ICU PROGRESS NOTE                   [address]            [place] [address]          [phone]   13 Days Post-Op Procedure(s) (LRB): THORACIC AORTIC ENDOVASCULAR STENT GRAFT/Thorasic and Abdominal Angiogram, Entravascular ultrasound., Open femoral exposure,Left Brachial access, and patch angioplasty right femoral artery. (N/A)  Total Length of Stay:  LOS: 60 days   Subjective: She has had intermittent nausea but no emesis. She states she has been up since 3 am with diffuse belly pain. She is convinced it is the nightly tube feedings.  Objective: Vital signs in last 24 hours: Temp:  [97.7 F (36.5 C)-98.9 F (37.2 C)] 98.5 F (36.9 C) (07/18 0400) Pulse Rate:  [25-102] 102 (07/18 0400) Cardiac Rhythm: Normal sinus rhythm (07/17 2000) Resp:  [18-27] 20 (07/18 0400) BP: (106-151)/(43-90) 151/47 (07/18 0400) SpO2:  [91 %-99 %] 94 % (07/18 0400) Weight:  [75.8 kg (167 lb 1.7 oz)] 75.8 kg (167 lb 1.7 oz) (07/18 0600)  Filed Weights   08/17/16 0500 08/18/16 0338 08/19/16 0600  Weight: 74.8 kg (164 lb 14.5 oz) 74.6 kg (164 lb 7.4 oz) 75.8 kg (167 lb 1.7 oz)      Intake/Output from previous day: 07/17 0701 - 07/18 0700 In: 720 [P.O.:360; NG/GT:360] Out: 4 [Stool:4]  Intake/Output this shift: No intake/output data recorded.  Current Meds: Scheduled Meds: . arformoterol  15 mcg Nebulization BID  . aspirin EC  81 mg Oral Daily  . budesonide (PULMICORT) nebulizer solution  0.5 mg Nebulization BID  . Chlorhexidine Gluconate Cloth  6 each Topical Daily  . citalopram  40 mg Oral Daily  . clonazePAM  1 mg Oral QHS  . dextrose  25 mL Intravenous Once  . docusate sodium  100 mg Oral Daily  . enoxaparin (LOVENOX) injection  40 mg Subcutaneous Q24H  . feeding supplement (VITAL 1.5 CAL)  1,000 mL Per Tube Q24H  . furosemide  20 mg Intravenous Daily  . insulin aspart  0-24 Units Subcutaneous Q6H  . insulin glargine  10 Units Subcutaneous BID  .  magic mouthwash  5 mL Oral QID  . metoprolol tartrate  25 mg Oral BID  . pantoprazole  40 mg Oral Daily  . sodium chloride flush  10-40 mL Intracatheter Q12H   Continuous Infusions: . sodium chloride Stopped (08/17/16 0830)  . lactated ringers Stopped (08/07/16 0900)   PRN Meds:.acetaminophen **OR** acetaminophen, ALPRAZolam, alum & mag hydroxide-simeth, Gerhardt's butt cream, guaiFENesin-dextromethorphan, HYDROmorphone (DILAUDID) injection, labetalol, metoprolol tartrate, ondansetron (ZOFRAN) IV, phenol, pneumococcal 23 valent vaccine, promethazine, simethicone, sodium chloride flush  Heart: Slightly tachycardic Lungs: Clear to auscultation bilaterally Abdomen: Soft, distended, rare bowel sounds this am, diffusely tender Extremities: SCDs in place  Lab Results: CBC:  Recent Labs  08/17/16 0751 08/18/16 0540  WBC 15.1* 15.3*  HGB 9.7* 9.8*  HCT 30.2* 30.5*  PLT 214 233   BMET:   Recent Labs  08/17/16 0751 08/18/16 0540  NA 132* 131*  K 3.9 3.8  CL 102 101  CO2 25 24  GLUCOSE 72 132*  BUN 14 16  CREATININE 0.97 0.92  CALCIUM 7.5* 7.4*    CMET: Lab Results  Component Value Date   WBC 15.3 (H) 08/18/2016   HGB 9.8 (L) 08/18/2016   HCT 30.5 (L) 08/18/2016   PLT 233 08/18/2016   GLUCOSE 132 (H)  08/18/2016   TRIG 159 (H) 08/10/2016   ALT 12 (L) 08/10/2016   AST 14 (L) 08/10/2016   NA 131 (L) 08/18/2016   K 3.8 08/18/2016   CL 101 08/18/2016   CREATININE 0.92 08/18/2016   BUN 16 08/18/2016   CO2 24 08/18/2016   INR 1.24 08/06/2016    PT/INR:  No results for input(s): LABPROT, INR in the last 72 hours. Radiology: No results found.   Assessment/Plan: 1. CV-ST in the low 100's. SBP well controlled. Continue Lopressor 25 mg bid. 2. Pulmonary-On room air. Continue Pulmicort and Brovana nebs. 3. GI- . Continue  boost, vital supplements, nightly TFs at 30 cc/hr, and calorie counts. On nightly TFs.  Will discuss management with surgeon. 4. Edema secondary to  low albumin. On Lasix 20 mg IV daily 5. DM-CBGs 97/119/163. On Insulin. Will consider restarting Glipizide once tolerating oral better. 6. Anemia-Last H and H stable  at 9.8 and 30.5 . She is NOT on ecasa or Lovenox. 7. Leukocytosis-Last WBCremains 15,300. Re check in am. 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      ZIMMERMAN,DONIELLE M PA-C 08/19/2016 7:12 AM   Stop nocturnal tube feeds as these appear to make her crampy abdominal pain worse. Remove feeding tube.  Transition from IV to po dilaudid every 6 h prn Anxious, crying from cramping  pain, nausea Abdominal exam benign Increase metoprolol to 37.5 twice a day for persistent tachycardia Remove Foley catheter Patient not a candidate for CIR rehab- unable to participate in therapies Patient is in a long term ICU condition from subacute persistent mesenteric ischemia- will ask for LTAC eval by case manager  patient examined and medical record reviewed,agree with above note. [name] 08/19/2016

## 2016-08-19 NOTE — Progress Notes (Signed)
I will sign off at this time. [phone]

## 2016-08-19 NOTE — Progress Notes (Signed)
Patient yelling in room. Patient states she will jump out of bed to cause herself harm so she can sue the hospital. Patient yelling for RN to take her to the ER.  Notified patient that I would call the on-call MD.  Dr. Servando Snare stated that there would be no new orders. Charge nurse made aware. Will continue to monitor. Bed alarm on, bed in lowest position. Call light within reach. Roselyn Reef Hendel Gatliff,RN

## 2016-08-19 NOTE — Progress Notes (Signed)
Pt declines to ambulate at this time. RN has offered 3 times since the start of the shift. Pt has used the bed side commode once, the bed pan twice per patient request. Patient states she feels too poorly to get out of bed.  Will continue to monitor. Roselyn Reef Amarylis Rovito,RN

## 2016-08-19 NOTE — Progress Notes (Addendum)
Progress Note  SUBJECTIVE:    Saw patient around 0930. Shivering in bed. States that she is freezing.   OBJECTIVE:   Vitals:   08/19/16 0000 08/19/16 0400  BP: 106/90 (!) 151/47  Pulse: 95 (!) 102  Resp: 20 20  Temp: 98.2 F (36.8 C) 98.5 F (36.9 C)    Intake/Output Summary (Last 24 hours) at 08/19/16 1059 Last data filed at 08/19/16 0600  Gross per 24 hour  Intake              720 ml  Output                3 ml  Net              717 ml   Had short run of vtach then converted to sinus tach Abdomen with mild distension.  ASSESSMENT/PLAN:   61 y.o. female is s/p: TEVAR for TBAD. Persistent ileus and abdominal pain.  13 Days Post-Op   Still with abdominal pain with poor po intake.  Disposition plan pending.  Noted transfer plans to stepdown  Alvia Grove 08/19/2016 10:59 AM -- LABS:   CBC    Component Value Date/Time   WBC 15.3 (H) 08/18/2016 0540   HGB 9.8 (L) 08/18/2016 0540   HCT 30.5 (L) 08/18/2016 0540   PLT 233 08/18/2016 0540    BMET    Component Value Date/Time   NA 131 (L) 08/18/2016 0540   K 3.8 08/18/2016 0540   CL 101 08/18/2016 0540   CO2 24 08/18/2016 0540   GLUCOSE 132 (H) 08/18/2016 0540   BUN 16 08/18/2016 0540   CREATININE 0.92 08/18/2016 0540   CALCIUM 7.4 (L) 08/18/2016 0540   GFRNONAA >60 08/18/2016 0540   GFRAA >60 08/18/2016 0540    COAG Lab Results  Component Value Date   INR 1.24 08/06/2016   INR 1.08 07/29/2016   INR 1.72 07/19/2016   No results found for: PTT  ANTIBIOTICS:   Anti-infectives    Start     Dose/Rate Route Frequency Ordered Stop   08/06/16 1400  vancomycin (VANCOCIN) IVPB 1000 mg/200 mL premix     1,000 mg 200 mL/hr over 60 Minutes Intravenous To [facility] 08/06/16 0038 08/06/16 1814   08/04/16 1100  ceFEPIme (MAXIPIME) 1 g in dextrose 5 % 50 mL IVPB     1 g 100 mL/hr over 30 Minutes Intravenous Every 12 hours 08/04/16 1049 08/11/16 2359   07/27/16 1400  meropenem (MERREM) 1 g in  sodium chloride 0.9 % 100 mL IVPB     1 g 200 mL/hr over 30 Minutes Intravenous Every 8 hours 07/27/16 0849 07/29/16 2143   07/25/16 2200  meropenem (MERREM) 1 g in sodium chloride 0.9 % 100 mL IVPB  Status:  Discontinued     1 g 200 mL/hr over 30 Minutes Intravenous Every 12 hours 07/25/16 1223 07/27/16 0849   07/23/16 1000  ceFEPIme (MAXIPIME) 1 g in dextrose 5 % 50 mL IVPB  Status:  Discontinued     1 g 100 mL/hr over 30 Minutes Intravenous Every 8 hours 07/23/16 0911 07/23/16 0917   07/23/16 1000  meropenem (MERREM) 1 g in sodium chloride 0.9 % 100 mL IVPB  Status:  Discontinued     1 g 200 mL/hr over 30 Minutes Intravenous Every 8 hours 07/23/16 0919 07/25/16 1223   07/18/16 0000  levofloxacin (LEVAQUIN) IVPB 500 mg  Status:  Discontinued     500 mg 100 mL/hr over  60 Minutes Intravenous Every 24 hours 07/17/16 0903 07/17/16 0938   07/17/16 1100  vancomycin (VANCOCIN) 1,500 mg in sodium chloride 0.9 % 500 mL IVPB     1,500 mg 250 mL/hr over 120 Minutes Intravenous Every 24 hours 07/17/16 0946 07/23/16 1221   07/17/16 1000  levofloxacin (LEVAQUIN) tablet 500 mg  Status:  Discontinued     500 mg Oral Daily 07/17/16 0851 07/17/16 0903   07/15/16 0900  ciprofloxacin (CIPRO) tablet 500 mg  Status:  Discontinued     500 mg Oral 2 times daily 07/15/16 0828 07/17/16 0851   07/14/16 1115  levofloxacin (LEVAQUIN) tablet 500 mg  Status:  Discontinued     500 mg Oral Daily 07/14/16 1107 07/14/16 1214   06/26/16 0830  meropenem (MERREM) 1 g in sodium chloride 0.9 % 100 mL IVPB  Status:  Discontinued     1 g 200 mL/hr over 30 Minutes Intravenous Every 8 hours 06/26/16 0810 07/06/16 0804   06/22/16 1800  clindamycin (CLEOCIN) IVPB 600 mg  Status:  Discontinued     600 mg 100 mL/hr over 30 Minutes Intravenous Every 8 hours 06/22/16 1723 06/26/16 0810   06/22/16 1000  azithromycin (ZITHROMAX) 250 mg in dextrose 5 % 125 mL IVPB  Status:  Discontinued     250 mg 125 mL/hr over 60 Minutes Intravenous  Every 24 hours 06/22/16 0826 06/27/16 1019   06/22/16 0900  levofloxacin (LEVAQUIN) IVPB 500 mg  Status:  Discontinued     500 mg 100 mL/hr over 60 Minutes Intravenous Every 24 hours 06/22/16 0759 06/22/16 [place], PA-C [facility] Office: [phone] Pager: [phone] 08/19/2016 10:59 AM    I have independently interviewed patient and agree with PA assessment and plan above. Abdominal exam remains benign.   Keefe Zawistowski C. Donzetta Matters, MD [facility] [place] Office: [phone] Pager: [phone]

## 2016-08-19 NOTE — Plan of Care (Signed)
Problem: Safety: Goal: Ability to remain free from injury will improve Outcome: Progressing No falls during this admission. Call bell within reach. Bed in low and locked position. Patient alert and oriented. Clean and clear environment maintained. 3/4 siderails in place. Bed alarm on. Nonskid footwear being utilized. Patient verbalized understanding of safety instruction.  Problem: Coping: Goal: Ability to adjust to condition or change in health will improve Outcome: Progressing Patient had poor motivation, but is agreeing to walk three times per day with much encouragement.

## 2016-08-20 LAB — CREATININE, SERUM
CREATININE: 1.1 mg/dL — AB (ref 0.44–1.00)
GFR calc non Af Amer: 54 mL/min — ABNORMAL LOW (ref >60)

## 2016-08-20 LAB — GLUCOSE, CAPILLARY
Glucose-Capillary: 89 mg/dL (ref 65–99)
Glucose-Capillary: 117 mg/dL — ABNORMAL HIGH (ref 65–99)

## 2016-08-20 MED ORDER — OXYCODONE HCL 5 MG PO TABS
5.0000 mg | ORAL_TABLET | ORAL | Status: DC | PRN
  Administered 2016-08-20 – 2016-08-21 (×6): 5 mg via ORAL
  Filled 2016-08-20 (×6): qty 1

## 2016-08-20 NOTE — Progress Notes (Signed)
Occupational Therapy Treatment Patient Details Name: Haley Graham MRN: [id number] DOB: 1955-03-25 Today's Date: 08/20/2016    History of present illness Pt adm with type B aortic dissection. Treated non surgically with BP control and pain control. Pt developed pancreatitis and ileus with NG tube placed. TEVAR procedure 08/06/16. PMH - anxiety, arthritis   OT comments  Pt with minimally improved participation today.  She required max cues/encouragement initially, but ambulated ~381ft with min guard +2, to occasional min A to guide walker.  She performed grooming activities standing at sink with min guard assist,  Pt c/o pain in abdomen 8/10, and insisted on return to bed despite max encouragement.  Bed placed in chair position.  Pt continues with behaviors that are self limiting - RECOMMEND PSYCH CONSULT   Will continue to follow.   Follow Up Recommendations  Home health OT;Supervision/Assistance - 24 hour    Equipment Recommendations  3 in 1 bedside commode;Tub/shower bench  .   Recommendations for Other Services      Precautions / Restrictions Precautions Precautions: Fall       Mobility Bed Mobility Overal bed mobility: Needs Assistance Bed Mobility: Supine to Sit;Sit to Supine     Supine to sit: Supervision Sit to supine: Supervision   General bed mobility comments: supervision for safety; pt used bed rail  Transfers Overall transfer level: Needs assistance Equipment used: 1 person hand held assist;Rolling walker (2 wheeled) Transfers: Sit to/from [facility] Sit to Stand: Min guard;Min assist Stand pivot transfers: Min guard       General transfer comment: pt grossly min guard for safety; HHA for some sit to stand transfers; pt able to stand pivot EOB to [facility] with min guard     Balance Overall balance assessment: Needs assistance Sitting-balance support: No upper extremity supported;Feet supported Sitting balance-Leahy Scale: Good     Standing  balance support: During functional activity Standing balance-Leahy Scale: Fair                             ADL either performed or assessed with clinical judgement   ADL Overall ADL's : Needs assistance/impaired     Grooming: Wash/dry hands;Wash/dry face;Oral care;Brushing hair;Min guard;Standing                   Toilet Transfer: Minimal assistance;Ambulation;Comfort height toilet;RW;BSC Toilet Transfer Details (indicate cue type and reason): Pt initially with knee buckling and LOB, that appears to be behavioral in nature          Functional mobility during ADLs: Minimal assistance;Min guard;+2 for safety/equipment General ADL Comments: Pt more agreeable to participate in ADL tasks this am.  Discussed showering with her, and having family bring in pajamas/clothes      Vision       Perception     Praxis      Cognition Arousal/Alertness: Awake/alert Behavior During Therapy: Anxious;Flat affect Overall Cognitive Status: Impaired/Different from baseline Area of Impairment: Attention;Safety/judgement;Problem solving                   Current Attention Level: Selective     Safety/Judgement: Decreased awareness of deficits;Decreased awareness of safety   Problem Solving: Decreased initiation;Requires verbal cues General Comments: Pt continues to require cues for intiation, safety, and sequencing         Exercises     Shoulder Instructions       General Comments VSS.  Pt seen with PT for  safety and motivation as she is easier to redirect to task with second person encouraging her,   ALso, pt with intermittent knee buckling and LOB requiring second person for safety     Pertinent Vitals/ Pain       Pain Assessment: Faces Faces Pain Scale: Hurts whole lot Pain Location: back and abdomen Pain Descriptors / Indicators: Grimacing;Guarding Pain Intervention(s): Monitored during session;Repositioned  Home Living                                           Prior Functioning/Environment              Frequency  Min 2X/week        Progress Toward Goals  OT Goals(current goals can now be found in the care plan section)  Progress towards OT goals: Progressing toward goals     Plan Discharge plan remains appropriate    Co-evaluation    PT/OT/SLP Co-Evaluation/Treatment: Yes Reason for Co-Treatment: For patient/therapist safety;To address functional/ADL transfers PT goals addressed during session: Mobility/safety with mobility OT goals addressed during session: ADL's and self-care      AM-PAC PT "6 Clicks" Daily Activity     Outcome Measure   Help from another person eating meals?: A Little Help from another person taking care of personal grooming?: A Little Help from another person toileting, which includes using toliet, bedpan, or urinal?: A Little Help from another person bathing (including washing, rinsing, drying)?: A Little Help from another person to put on and taking off regular upper body clothing?: A Lot Help from another person to put on and taking off regular lower body clothing?: A Lot 6 Click Score: 16    End of Session Equipment Utilized During Treatment: Gait belt;Rolling walker  OT Visit Diagnosis: Unsteadiness on feet (R26.81);Pain   Activity Tolerance Patient tolerated treatment well   Patient Left in bed;Other (comment) (chair position )   Nurse Communication Mobility status        Time: 1036-1100 OT Time Calculation (min): 24 min  Charges: OT General Charges $OT Visit: 1 Procedure OT Treatments $Self Care/Home Management : 8-22 mins  [facility], OTR/L [phone]    Lucille Passy M 08/20/2016, 1:42 PM

## 2016-08-20 NOTE — Progress Notes (Signed)
Patient ID: Haley Graham, female   DOB: April 26, 1955, 61 y.o.   MRN: [id number]  SICU Evening Rounds  Hemodynamically stable  Pain control seems to be big issue today. Has been on Dilaudid 1 mg every 6 H. Wants to try Oxy IR every 4 hrs instead.

## 2016-08-20 NOTE — Care Management Note (Addendum)
Case Management Note Previous note created by: Marvetta Gibbons RN, BSN Unit 2W-Case Manager-- [place] coverage [phone]  Patient Details  Name: Haley Graham MRN: [id number] Date of Birth: 10-24-1955  Subjective/Objective:  Pt admitted with [place] type B aortic dissection-- With pancreatitis and ileus after [place] type B dissection- has NGT in place                  Action/Plan: PTA pt lived at home with spouse- referral for FMLA paperwork needed by daughter- have referred her to her mother's primary care doctor regarding this- CM to follow for d/c needs  Expected Discharge Date:                  Expected Discharge Plan:  Long Term Acute Care (LTAC)  In-House Referral:     Discharge planning Services  CM Consult  Post Acute Care Choice:  Home Health Choice offered to:     DME Arranged:    DME Agency:     HH Arranged:    [place] Agency:     Status of Service:  In process, will continue to follow  If discussed at Long Length of Stay Meetings, dates discussed:  6/5, 6/7, 6/12, 6/14, 6/19, 6/21  Discharge Disposition:   Additional Comments: 08/20/2016  CM contacted attending to request clarification with nutrition plan (pt is not taking in enough to fill nutrition requirements and all supplemental nutrition has been discontinued)  LTACH referral approved by physician advisor - both agencies given referral.  [facility] declined to offer bed, [facility] offered bed.  CM discussed directly with pt and daughter - [facility] Liaison to meet with daughter and family  Discussed in LOS 08/18/16 - pt remains appropriate for continued stay.  Tube feeds reinitated overnight however pt not tolerating well - will get IV nausea medications.  08/18/16 Pt remains on IV lasix and PRN IV dilaudid,  persitent complaints of abdominal pain that is limiting activity.  CM spoke with CIR - it is not likely that pt will be deemed appropriate and even less likely that insurance will cover.  CM spoke directly with  with pt and mother in law at bedside - mother in law and other family members are planning on staying with pt at discharge (however reiterated to pt that she will need to mostly independent at discharge for family to feel comfortable).  Pt was completely independent from home with husband.  Pt has cortrak however refusing feeds  08/13/16 Discussed in LOS 7/12 - remains appropriate for continued stay.  PT/OT both recommend [facility] - requested orders via physician sticky tab  08/12/16 Elenor Quinones, RN, BSN [phone] 5 days s/p TAEVR.  TPN was reduced to 50 ml/hr yesterday night when TF was initiated (plan is to wean pt off TPN).  Pt is also slowing progressing on liquid diet.  Recommendations currently are for [facility] and DME - orders will be written closer to discharge so arrangements can be made.  Daughter contacted CIR directly to request admittance consideration.  07/23/16- 1000- Kristi Webster RN, CM- pt continues with prolonged ileus and ischemia - on TPA and only sips of clears- plan per notes for possible thoracic aortic endovascular stent later this week.  Per PT recommendations for [facility] f/u - will need orders prior to discharge- CM will continue to follow for d/c needs when medically ready.   07/07/16- 1245- Kristi Webster RN, CM- pt remains- NPO, TNA- per MD note- plan repeat CT abd-pelvis to assess bowel viability- CM to  continue to follow.   Maryclare Labrador, RN 08/20/2016, 2:14 PM

## 2016-08-20 NOTE — Progress Notes (Signed)
Physical Therapy Treatment Patient Details Name: Haley Graham MRN: [id number] DOB: 1955/12/25 Today's Date: 08/20/2016    History of Present Illness Pt adm with type B aortic dissection. Treated non surgically with BP control and pain control. Pt developed pancreatitis and ileus with NG tube placed. TEVAR procedure 08/06/16. PMH - anxiety, arthritis    PT Comments    Patient continues to demonstrate self limiting behavior. Patient grossly supervision/min guard for mobility however required min A and +2 for safety at times due to behavioral issues. Pt tolerated ambulating 399ft on RA well with increase in RR due to anxiety with mobility. Continue to progress as tolerated.  Recommend Psych consult.    Follow Up Recommendations  Home health PT;Supervision/Assistance - 24 hour     Equipment Recommendations  Rolling walker with 5" wheels    Recommendations for Other Services       Precautions / Restrictions Precautions Precautions: Fall    Mobility  Bed Mobility Overal bed mobility: Needs Assistance Bed Mobility: Supine to Sit;Sit to Supine     Supine to sit: Supervision Sit to supine: Supervision   General bed mobility comments: supervision for safety; pt used bed rail  Transfers Overall transfer level: Needs assistance Equipment used: 1 person hand held assist;Rolling walker (2 wheeled) Transfers: Sit to/from [facility] Sit to Stand: Min guard;Min assist Stand pivot transfers: Min guard       General transfer comment: pt grossly min guard for safety; HHA for some sit to stand transfers; pt able to stand pivot EOB to [facility] with min guard   Ambulation/Gait Ambulation/Gait assistance: Min guard;+2 safety/equipment;Min assist Ambulation Distance (Feet): 380 Feet Assistive device: Rolling walker (2 wheeled) (last ~33ft with HHA and no AD) Gait Pattern/deviations: Step-through pattern;Decreased stride length     General Gait Details: pt continues to be  very guarded and required directional cues and vc for breathing   Stairs            Wheelchair Mobility    Modified Rankin (Stroke Patients Only)       Balance Overall balance assessment: Needs assistance Sitting-balance support: No upper extremity supported;Feet supported Sitting balance-Leahy Scale: Good       Standing balance-Leahy Scale: Fair                              Cognition Arousal/Alertness: Awake/alert Behavior During Therapy: Anxious;Flat affect Overall Cognitive Status: Impaired/Different from baseline Area of Impairment: Attention;Safety/judgement;Problem solving                   Current Attention Level: Selective     Safety/Judgement: Decreased awareness of deficits;Decreased awareness of safety   Problem Solving: Decreased initiation;Requires verbal cues        Exercises      General Comments        Pertinent Vitals/Pain Pain Assessment: Faces Faces Pain Scale: Hurts whole lot Pain Location: back and abdomen Pain Descriptors / Indicators: Grimacing;Guarding Pain Intervention(s): Monitored during session;Repositioned    Home Living                      Prior Function            PT Goals (current goals can now be found in the care plan section) Progress towards PT goals: Not progressing toward goals - comment (self-limiting behavior)    Frequency    Min 3X/week      PT Plan  Current plan remains appropriate    Co-evaluation PT/OT/SLP Co-Evaluation/Treatment: Yes Reason for Co-Treatment: For patient/therapist safety;To address functional/ADL transfers PT goals addressed during session: Mobility/safety with mobility OT goals addressed during session: ADL's and self-care      AM-PAC PT "6 Clicks" Daily Activity  Outcome Measure  Difficulty turning over in bed (including adjusting bedclothes, sheets and blankets)?: A Little Difficulty moving from lying on back to sitting on the side of the  bed? : Total Difficulty sitting down on and standing up from a chair with arms (e.g., wheelchair, bedside commode, etc,.)?: Total Help needed moving to and from a bed to chair (including a wheelchair)?: A Little Help needed walking in hospital room?: A Little Help needed climbing 3-5 steps with a railing? : A Little 6 Click Score: 14    End of Session Equipment Utilized During Treatment: Gait belt Activity Tolerance: Patient tolerated treatment well Patient left: in bed;with call bell/phone within reach;Other (comment) (bed in chair position) Nurse Communication: Mobility status PT Visit Diagnosis: Muscle weakness (generalized) (M62.81);Other abnormalities of gait and mobility (R26.89)     Time: 1025-1100 PT Time Calculation (min) (ACUTE ONLY): 35 min  Charges:  $Gait Training: 8-22 mins                    G Codes:       Earney Navy, PTA Pager: [phone]     Darliss Cheney 08/20/2016, 1:31 PM

## 2016-08-20 NOTE — Progress Notes (Addendum)
Progress Note  SUBJECTIVE:    POD #14  In much better spirits this am. Says her pain is a little better. Says she wants to drink some ensure and will start walking this evening when her husband comes.   OBJECTIVE:   Vitals:   08/20/16 0700 08/20/16 0727  BP: (!) 113/41 (!) 117/54  Pulse: 79 92  Resp: 17 15  Temp:  98.4 F (36.9 C)    Intake/Output Summary (Last 24 hours) at 08/20/16 0751 Last data filed at 08/20/16 0728  Gross per 24 hour  Intake              930 ml  Output              800 ml  Net              130 ml   Abdomen soft, no distension. No tenderness 2+ DP pulses bilaterally.   ASSESSMENT/PLAN:   61 y.o. female is s/p: TEVAR for TBAD. Persistent ileus. Abdominal pain improving.  14 Days Post-Op   Seems to be feeling better today. Had low grade fever of 100.4 last night. Afebrile this am.  Tube feeds discontinued. Says she will drink ensure today. Encouraged increased po intake as tolerated. Encouraged ambulation. She seems more motivated today.   Alvia Grove 08/20/2016 7:51 AM -- LABS:   CBC    Component Value Date/Time   WBC 15.3 (H) 08/18/2016 0540   HGB 9.8 (L) 08/18/2016 0540   HCT 30.5 (L) 08/18/2016 0540   PLT 233 08/18/2016 0540    BMET    Component Value Date/Time   NA 131 (L) 08/18/2016 0540   K 3.8 08/18/2016 0540   CL 101 08/18/2016 0540   CO2 24 08/18/2016 0540   GLUCOSE 132 (H) 08/18/2016 0540   BUN 16 08/18/2016 0540   CREATININE 1.10 (H) 08/20/2016 0407   CALCIUM 7.4 (L) 08/18/2016 0540   GFRNONAA 54 (L) 08/20/2016 0407   GFRAA >60 08/20/2016 0407    COAG Lab Results  Component Value Date   INR 1.24 08/06/2016   INR 1.08 07/29/2016   INR 1.72 07/19/2016   No results found for: PTT  ANTIBIOTICS:   Anti-infectives    Start     Dose/Rate Route Frequency Ordered Stop   08/06/16 1400  vancomycin (VANCOCIN) IVPB 1000 mg/200 mL premix     1,000 mg 200 mL/hr over 60 Minutes Intravenous To [facility]  08/06/16 0038 08/06/16 1814   08/04/16 1100  ceFEPIme (MAXIPIME) 1 g in dextrose 5 % 50 mL IVPB     1 g 100 mL/hr over 30 Minutes Intravenous Every 12 hours 08/04/16 1049 08/11/16 2359   07/27/16 1400  meropenem (MERREM) 1 g in sodium chloride 0.9 % 100 mL IVPB     1 g 200 mL/hr over 30 Minutes Intravenous Every 8 hours 07/27/16 0849 07/29/16 2143   07/25/16 2200  meropenem (MERREM) 1 g in sodium chloride 0.9 % 100 mL IVPB  Status:  Discontinued     1 g 200 mL/hr over 30 Minutes Intravenous Every 12 hours 07/25/16 1223 07/27/16 0849   07/23/16 1000  ceFEPIme (MAXIPIME) 1 g in dextrose 5 % 50 mL IVPB  Status:  Discontinued     1 g 100 mL/hr over 30 Minutes Intravenous Every 8 hours 07/23/16 0911 07/23/16 0917   07/23/16 1000  meropenem (MERREM) 1 g in sodium chloride 0.9 % 100 mL IVPB  Status:  Discontinued  1 g 200 mL/hr over 30 Minutes Intravenous Every 8 hours 07/23/16 0919 07/25/16 1223   07/18/16 0000  levofloxacin (LEVAQUIN) IVPB 500 mg  Status:  Discontinued     500 mg 100 mL/hr over 60 Minutes Intravenous Every 24 hours 07/17/16 0903 07/17/16 0938   07/17/16 1100  vancomycin (VANCOCIN) 1,500 mg in sodium chloride 0.9 % 500 mL IVPB     1,500 mg 250 mL/hr over 120 Minutes Intravenous Every 24 hours 07/17/16 0946 07/23/16 1221   07/17/16 1000  levofloxacin (LEVAQUIN) tablet 500 mg  Status:  Discontinued     500 mg Oral Daily 07/17/16 0851 07/17/16 0903   07/15/16 0900  ciprofloxacin (CIPRO) tablet 500 mg  Status:  Discontinued     500 mg Oral 2 times daily 07/15/16 0828 07/17/16 0851   07/14/16 1115  levofloxacin (LEVAQUIN) tablet 500 mg  Status:  Discontinued     500 mg Oral Daily 07/14/16 1107 07/14/16 1214   06/26/16 0830  meropenem (MERREM) 1 g in sodium chloride 0.9 % 100 mL IVPB  Status:  Discontinued     1 g 200 mL/hr over 30 Minutes Intravenous Every 8 hours 06/26/16 0810 07/06/16 0804   06/22/16 1800  clindamycin (CLEOCIN) IVPB 600 mg  Status:  Discontinued     600  mg 100 mL/hr over 30 Minutes Intravenous Every 8 hours 06/22/16 1723 06/26/16 0810   06/22/16 1000  azithromycin (ZITHROMAX) 250 mg in dextrose 5 % 125 mL IVPB  Status:  Discontinued     250 mg 125 mL/hr over 60 Minutes Intravenous Every 24 hours 06/22/16 0826 06/27/16 1019   06/22/16 0900  levofloxacin (LEVAQUIN) IVPB 500 mg  Status:  Discontinued     500 mg 100 mL/hr over 60 Minutes Intravenous Every 24 hours 06/22/16 0759 06/22/16 [place], PA-C [facility] Office: [phone] Pager: [phone] 08/20/2016 7:51 AM    I have independently interviewed patient and agree with PA assessment and plan above. Not complaining of abdominal pain on my exam today. She wants to walk and states she will try to eat.   [place] Chelf C. Donzetta Matters, MD [facility] [place] Office: [phone] Pager: [phone]

## 2016-08-20 NOTE — Progress Notes (Signed)
T[facility] DAILY ICU PROGRESS NOTE                   [address]            [place] [address]          [phone]   14 Days Post-Op Procedure(s) (LRB): THORACIC AORTIC ENDOVASCULAR STENT GRAFT/Thorasic and Abdominal Angiogram, Entravascular ultrasound., Open femoral exposure,Left Brachial access, and patch angioplasty right femoral artery. (N/A)  Total Length of Stay:  LOS: 61 days   Subjective: Feels better this am, wants to walk   Objective: Vital signs in last 24 hours: Temp:  [97.4 F (36.3 C)-100.9 F (38.3 C)] 98.4 F (36.9 C) (07/19 0727) Pulse Rate:  [79-100] 92 (07/19 0727) Cardiac Rhythm: Normal sinus rhythm (07/19 0400) Resp:  [15-23] 15 (07/19 0727) BP: (90-125)/(36-77) 117/54 (07/19 0727) SpO2:  [93 %-96 %] 95 % (07/19 0809) Weight:  [162 lb 4.1 oz (73.6 kg)] 162 lb 4.1 oz (73.6 kg) (07/19 0600)  Filed Weights   08/18/16 0338 08/19/16 0600 08/20/16 0600  Weight: 164 lb 7.4 oz (74.6 kg) 167 lb 1.7 oz (75.8 kg) 162 lb 4.1 oz (73.6 kg)    Weight change: -4 lb 13.6 oz (-2.2 kg)   Hemodynamic parameters for last 24 hours:    Intake/Output from previous day: 07/18 0701 - 07/19 0700 In: 930 [P.O.:900; I.V.:30] Out: 600 [Urine:600]  Intake/Output this shift: Total I/O In: -  Out: 200 [Stool:200]  Current Meds: Scheduled Meds: . arformoterol  15 mcg Nebulization BID  . aspirin EC  81 mg Oral Daily  . budesonide (PULMICORT) nebulizer solution  0.5 mg Nebulization BID  . Chlorhexidine Gluconate Cloth  6 each Topical Daily  . citalopram  40 mg Oral Daily  . clonazePAM  1 mg Oral QHS  . dextrose  25 mL Intravenous Once  . docusate sodium  100 mg Oral Daily  . enoxaparin (LOVENOX) injection  40 mg Subcutaneous Q24H  . furosemide  20 mg Intravenous Daily  . insulin aspart  0-24 Units Subcutaneous Q6H  . insulin glargine  10 Units Subcutaneous QHS  . magic mouthwash  5 mL Oral QID  . metoprolol tartrate  37.5 mg Oral BID  . pantoprazole  40  mg Oral Daily  . sodium chloride flush  10-40 mL Intracatheter Q12H   Continuous Infusions: . sodium chloride Stopped (08/17/16 0830)  . lactated ringers Stopped (08/07/16 0900)   PRN Meds:.acetaminophen **OR** acetaminophen, ALPRAZolam, alum & mag hydroxide-simeth, [place]ilola Flamm's butt cream, guaiFENesin-dextromethorphan, HYDROmorphone, labetalol, metoprolol tartrate, ondansetron (ZOFRAN) IV, phenol, pneumococcal 23 valent vaccine, promethazine, simethicone  General appearance: cooperative Neurologic: intact Heart: regular rate and rhythm, S1, S2 normal, no murmur, click, rub or gallop Lungs: diminished breath sounds bibasilar Abdomen: unchanged , mild distension   Lab Results: CBC: Recent Labs  08/18/16 0540  WBC 15.3*  HGB 9.8*  HCT 30.5*  PLT 233   BMET:  Recent Labs  08/18/16 0540 08/20/16 0407  NA 131*  --   K 3.8  --   CL 101  --   CO2 24  --   GLUCOSE 132*  --   BUN 16  --   CREATININE 0.92 1.10*  CALCIUM 7.4*  --     CMET: Lab Results  Component Value Date   WBC 15.3 (H) 08/18/2016   HGB 9.8 (L) 08/18/2016   HCT 30.5 (L) 08/18/2016   PLT 233 08/18/2016   GLUCOSE 132 (H) 08/18/2016   TRIG 159 (  H) 08/10/2016   ALT 12 (L) 08/10/2016   AST 14 (L) 08/10/2016   NA 131 (L) 08/18/2016   K 3.8 08/18/2016   CL 101 08/18/2016   CREATININE 1.10 (H) 08/20/2016   BUN 16 08/18/2016   CO2 24 08/18/2016   INR 1.24 08/06/2016      PT/INR: No results for input(s): LABPROT, INR in the last 72 hours. Radiology: No results found.   Assessment/Plan: S/P Procedure(s) (LRB): THORACIC AORTIC ENDOVASCULAR STENT GRAFT/Thorasic and Abdominal Angiogram, Entravascular ultrasound., Open femoral exposure,Left Brachial access, and patch angioplasty right femoral artery. (N/A) Mobilize Discuss transfer with Dr PVT     Haley Graham 08/20/2016 8:36 AM

## 2016-08-21 LAB — GLUCOSE, CAPILLARY
Glucose-Capillary: 102 mg/dL — ABNORMAL HIGH (ref 65–99)
Glucose-Capillary: 84 mg/dL (ref 65–99)
Glucose-Capillary: 85 mg/dL (ref 65–99)
Glucose-Capillary: 93 mg/dL (ref 65–99)
Glucose-Capillary: 97 mg/dL (ref 65–99)

## 2016-08-21 MED ORDER — ENSURE ENLIVE PO LIQD
237.0000 mL | Freq: Three times a day (TID) | ORAL | Status: DC
  Administered 2016-08-21: 237 mL via ORAL

## 2016-08-21 NOTE — Progress Notes (Addendum)
T[facility] DAILY ICU PROGRESS NOTE                   [address]            [place] [address]          [phone]   15 Days Post-Op Procedure(s) (LRB): THORACIC AORTIC ENDOVASCULAR STENT GRAFT/Thorasic and Abdominal Angiogram, Entravascular ultrasound., Open femoral exposure,Left Brachial access, and patch angioplasty right femoral artery. (N/A)  Total Length of Stay:  LOS: 62 days   Subjective: conts to feel better, appetite remains an issue but says she will eat food she"likes"  Objective: Vital signs in last 24 hours: Temp:  [97.2 F (36.2 C)-98.7 F (37.1 C)] 97.9 F (36.6 C) (07/20 0753) Pulse Rate:  [85-106] 104 (07/20 0905) Cardiac Rhythm: Normal sinus rhythm (07/20 0400) Resp:  [18-27] 19 (07/20 0905) BP: (111-133)/(44-55) 121/51 (07/20 0905) SpO2:  [94 %-95 %] 95 % (07/20 0400) FiO2 (%):  [98 %] 98 % (07/20 0905) Weight:  [159 lb 9.8 oz (72.4 kg)] 159 lb 9.8 oz (72.4 kg) (07/20 0442)  Filed Weights   08/19/16 0600 08/20/16 0600 08/21/16 0442  Weight: 167 lb 1.7 oz (75.8 kg) 162 lb 4.1 oz (73.6 kg) 159 lb 9.8 oz (72.4 kg)    Weight change: -2 lb 10.3 oz (-1.2 kg)   Hemodynamic parameters for last 24 hours:    Intake/Output from previous day: 07/19 0701 - 07/20 0700 In: 360 [P.O.:360] Out: 700 [Urine:500; Stool:200]  Intake/Output this shift: No intake/output data recorded.  Current Meds: Scheduled Meds: . arformoterol  15 mcg Nebulization BID  . aspirin EC  81 mg Oral Daily  . budesonide (PULMICORT) nebulizer solution  0.5 mg Nebulization BID  . Chlorhexidine Gluconate Cloth  6 each Topical Daily  . citalopram  40 mg Oral Daily  . clonazePAM  1 mg Oral QHS  . dextrose  25 mL Intravenous Once  . docusate sodium  100 mg Oral Daily  . enoxaparin (LOVENOX) injection  40 mg Subcutaneous Q24H  . furosemide  20 mg Intravenous Daily  . insulin aspart  0-24 Units Subcutaneous Q6H  . insulin glargine  10 Units Subcutaneous QHS  . magic  mouthwash  5 mL Oral QID  . metoprolol tartrate  37.5 mg Oral BID  . pantoprazole  40 mg Oral Daily  . sodium chloride flush  10-40 mL Intracatheter Q12H   Continuous Infusions: . sodium chloride Stopped (08/17/16 0830)  . lactated ringers Stopped (08/07/16 0900)   PRN Meds:.acetaminophen **OR** acetaminophen, ALPRAZolam, alum & mag hydroxide-simeth, Gerhardt's butt cream, guaiFENesin-dextromethorphan, labetalol, metoprolol tartrate, ondansetron (ZOFRAN) IV, oxyCODONE, phenol, pneumococcal 23 valent vaccine, promethazine, simethicone  General appearance: alert, cooperative and no distress Heart: regular rate and rhythm Lungs: clear to auscultation bilaterally Abdomen: mild TTP, no guarding or rebound, non-distended Extremities: warm well perfused Wound: healing well  Lab Results: CBC:No results for input(s): WBC, HGB, HCT, PLT in the last 72 hours. BMET:  Recent Labs  08/20/16 0407  CREATININE 1.10*    CMET: Lab Results  Component Value Date   WBC 15.3 (H) 08/18/2016   HGB 9.8 (L) 08/18/2016   HCT 30.5 (L) 08/18/2016   PLT 233 08/18/2016   GLUCOSE 132 (H) 08/18/2016   TRIG 159 (H) 08/10/2016   ALT 12 (L) 08/10/2016   AST 14 (L) 08/10/2016   NA 131 (L) 08/18/2016   K 3.8 08/18/2016   CL 101 08/18/2016   CREATININE 1.10 (H) 08/20/2016  BUN 16 08/18/2016   CO2 24 08/18/2016   INR 1.24 08/06/2016      PT/INR: No results for input(s): LABPROT, INR in the last 72 hours. Radiology: No results found.   Assessment/Plan: S/P Procedure(s) (LRB): THORACIC AORTIC ENDOVASCULAR STENT GRAFT/Thorasic and Abdominal Angiogram, Entravascular ultrasound., Open femoral exposure,Left Brachial access, and patch angioplasty right femoral artery. (N/A)  1 conts to look good 2 no new labs 3 hemodyn stable in sinus rhythm 4 push nutrition as able- she can have outside food 5 ambulate /pulm toilet- routine 6 prob tx to 4E   GOLD,WAYNE E 08/21/2016 9:28 AM   I have seen and  examined the patient and agree with the assessment and plan as outlined.  Patient has remained clinically stable for quite some time now w/ well controlled BP.  She has been tolerating a soft diet and bowel function has normalized.  She has no surgical issues.  She has been gradually beginning to ambulate more.  She does continue to report chronic pain for which she is taking narcotics.  I don't think keeping her in the ICU is indicated at this time and favor transfer.  Will need to look into plans for disposition.  Rexene Alberts, MD 08/21/2016 9:46 AM

## 2016-08-21 NOTE — Progress Notes (Signed)
Patient arrived to 4E room 02.  Telemetry monitor applied and CCMD notified.  Patient oriented to unit and room to include call light and phone.  Will continue to monitor.

## 2016-08-21 NOTE — Progress Notes (Signed)
Pt repositioned in the chair at [place]. This RN informed pt must stay in chair until ~1045. Pt expressed she would not be able to stay up that long. RN received call at ~1000 that pt needed her at the bedside. Pt sitting up in the chair breathing fast expressing she felt like she was going to pass out and wanted to go back to bed. This RN told pt it was not safe to stand if she was going to pass out. Pt insisted, with increasing frustration, on going back to bed. RN again informed pt it wasn't safe. Pt became mad, respirations increased and were more shallow, and her voice increased in volume. RN encouraged pt to take slow deep breaths in through the nose and out the mouth to gain control over her breathing. Pt refused, and began to get herself out of the chair. RN attempted to stop pt reminding her it wasn't safe. Pt pushed through RN, raising her voice to a scream saying "I am not staying in this chair, I have to get back in that bed." Pt shifted herself off from the chair, becoming unsteady, RN grabbed pt before she fell. Pt returned self to bed with RN's assistance. RN insured call light was in reach, bed in the lowest position, and bed alarm on. Nurse Tech sent in room to settle pt in bed.

## 2016-08-21 NOTE — Progress Notes (Signed)
RN to pt's bedside to ambulate pt. PT refusing until husband comes this evening. RN reminded pt the importance of movement in her recovery process. Pt continued to refuse.

## 2016-08-21 NOTE — Care Management Note (Addendum)
Case Management Note Previous note created by: Marvetta Gibbons RN, BSN Unit 2W-Case Manager-- [place] coverage [phone]  Patient Details  Name: Haley Graham MRN: [id number] Date of Birth: [birth year]  Subjective/Objective:  Pt admitted with Stanford type B aortic dissection-- With pancreatitis and ileus after Stanford type B dissection- has NGT in place                  Action/Plan: PTA pt lived at home with spouse- referral for FMLA paperwork needed by daughter- have referred her to her mother's primary care doctor regarding this- CM to follow for d/c needs  Expected Discharge Date:                  Expected Discharge Plan:  Long Term Acute Care (LTAC)  In-House Referral:     Discharge planning Services  CM Consult  Post Acute Care Choice:  Home Health Choice offered to:     DME Arranged:    DME Agency:     HH Arranged:    [place] Agency:     Status of Service:  In process, will continue to follow  If discussed at Long Length of Stay Meetings, dates discussed:  6/5, 6/7, 6/12, 6/14, 6/19, 6/21  Discharge Disposition:   Additional Comments: 08/21/2016  Pts husband chose [facility] for agency for [facility] - pt already has recommended equipment in the home.  CM requested [place] orders and will arrange [facility]  [employer] has approved LTACH however Pt now adamantly refusing LTACH to discharge straight home - even with attending at bedside.   Both pts husband and daughter would prefer pt to discharge to [facility] however husband is prepared for pt to discharge home (husband and mother in law will provide 24 hour supervision).   CM left another [place] list in pts room and asked for her and husband to decide on agency in preparation for home discharge.    Family/pt agreed for [facility] to submit insurance auth 7/19 - however this am CM informed that preference is for pt to return home with supervision - family is okay to continue with insurance auth for [facility].  CM spoke directly with Dr Roxy Manns - pt will be transferred to  4 E and will continue to increase PO intake  Per night nurse pt did ambulate around the unit early this am, still not taking in adequate nutrition, received IV zofran and PO pain medications.  Recommendation continues to be HH from in house therapies.  LTACH referral submitted due to lack of progression and LOS.  08/20/16 CM contacted attending to request clarification with nutrition plan (pt is not taking in enough to fill nutrition requirements and all supplemental nutrition has been discontinued).  Plan is to continue to encourage pt to take more and more intake and add ensure - if pt is unable then cortrak will be replaced - there is no plan for TPN.  LTACH referral approved by physician advisor due to lack of progression and LOS - both agencies given referral.  [facility] declined to offer bed, [facility] offered bed.  CM discussed directly with pt and daughter - [facility] Liaison to meet with daughter and family  Discussed in LOS 08/18/16 - pt remains appropriate for continued stay.  Tube feeds reinitated overnight however pt not tolerating well - will get IV nausea medications.  08/18/16 Pt remains on IV lasix and PRN IV dilaudid,  persitent complaints of abdominal pain that is limiting activity.  CM spoke with CIR - it is not  likely that pt will be deemed appropriate and even less likely that insurance will cover.  CM spoke directly with with pt and mother in law at bedside - mother in law and other family members are planning on staying with pt at discharge (however reiterated to pt that she will need to mostly independent at discharge for family to feel comfortable).  Pt was completely independent from home with husband.  Pt has cortrak however refusing feeds  08/13/16 Discussed in LOS 7/12 - remains appropriate for continued stay.  PT/OT both recommend [facility] - requested orders via physician sticky tab  08/12/16 Elenor Quinones, RN, BSN [phone] 5 days s/p TAEVR.  TPN was reduced to 50 ml/hr  yesterday night when TF was initiated (plan is to wean pt off TPN).  Pt is also slowing progressing on liquid diet.  Recommendations currently are for [facility] and DME - orders will be written closer to discharge so arrangements can be made.  Daughter contacted CIR directly to request admittance consideration.  07/23/16- 1000- Kristi Webster RN, CM- pt continues with prolonged ileus and ischemia - on TPA and only sips of clears- plan per notes for possible thoracic aortic endovascular stent later this week.  Per PT recommendations for [facility] f/u - will need orders prior to discharge- CM will continue to follow for d/c needs when medically ready.   07/07/16- 1245- Kristi Webster RN, CM- pt remains- NPO, TNA- per MD note- plan repeat CT abd-pelvis to assess bowel viability- CM to continue to follow.   Maryclare Labrador, RN 08/21/2016, 8:40 AM

## 2016-08-21 NOTE — Progress Notes (Signed)
RN to pt's beside as meal trays were delivered. RN told pt it was time to sit in the chair for lunch. Pt refused to get out of bed, but sat on the side of the bed. RN reminded pt it was not safe to remain seated on side of bed, and that the chair was a safer option. PT continued to refuse, saying in a raised voice "I am not getting out of bed, leave me right here and go away." RN reinforced the safety concern, and reminded pt the chair will help improve pt's condition and goal to go home. PT eventually laid back in bed refusing to cooperate. RN encouraged pt again, and pt replied "leave me alone, get out of here."

## 2016-08-21 NOTE — Progress Notes (Addendum)
Progress Note  SUBJECTIVE:    Abdomen still hurts but feels better overall. Plans on walking again today.   OBJECTIVE:   Vitals:   08/21/16 0500 08/21/16 0600  BP:    Pulse:    Resp: (!) 23 (!) 21  Temp:      Intake/Output Summary (Last 24 hours) at 08/21/16 0751 Last data filed at 08/21/16 0400  Gross per 24 hour  Intake              360 ml  Output              500 ml  Net             -140 ml   Abdomen soft and non tender.  Palpable DP pulses bilaterally.   ASSESSMENT/PLAN:   61 y.o. female is s/p: TEVAR  15 Days Post-Op   Abdominal pain improving a little bit.  Making slow progress but appears to be more motivated to walk and eat.   Alvia [place] 08/21/2016 7:51 AM -- LABS:   CBC    Component Value Date/Time   WBC 15.3 (H) 08/18/2016 0540   HGB 9.8 (L) 08/18/2016 0540   HCT 30.5 (L) 08/18/2016 0540   PLT 233 08/18/2016 0540    BMET    Component Value Date/Time   NA 131 (L) 08/18/2016 0540   K 3.8 08/18/2016 0540   CL 101 08/18/2016 0540   CO2 24 08/18/2016 0540   GLUCOSE 132 (H) 08/18/2016 0540   BUN 16 08/18/2016 0540   CREATININE 1.10 (H) 08/20/2016 0407   CALCIUM 7.4 (L) 08/18/2016 0540   GFRNONAA 54 (L) 08/20/2016 0407   GFRAA >60 08/20/2016 0407    COAG Lab Results  Component Value Date   INR 1.24 08/06/2016   INR 1.08 07/29/2016   INR 1.72 07/19/2016   No results found for: PTT  ANTIBIOTICS:   Anti-infectives    Start     Dose/Rate Route Frequency Ordered Stop   08/06/16 1400  vancomycin (VANCOCIN) IVPB 1000 mg/200 mL premix     1,000 mg 200 mL/hr over 60 Minutes Intravenous To [facility] 08/06/16 0038 08/06/16 1814   08/04/16 1100  ceFEPIme (MAXIPIME) 1 g in dextrose 5 % 50 mL IVPB     1 g 100 mL/hr over 30 Minutes Intravenous Every 12 hours 08/04/16 1049 08/11/16 2359   07/27/16 1400  meropenem (MERREM) 1 g in sodium chloride 0.9 % 100 mL IVPB     1 g 200 mL/hr over 30 Minutes Intravenous Every 8 hours 07/27/16  0849 07/29/16 2143   07/25/16 2200  meropenem (MERREM) 1 g in sodium chloride 0.9 % 100 mL IVPB  Status:  Discontinued     1 g 200 mL/hr over 30 Minutes Intravenous Every 12 hours 07/25/16 1223 07/27/16 0849   07/23/16 1000  ceFEPIme (MAXIPIME) 1 g in dextrose 5 % 50 mL IVPB  Status:  Discontinued     1 g 100 mL/hr over 30 Minutes Intravenous Every 8 hours 07/23/16 0911 07/23/16 0917   07/23/16 1000  meropenem (MERREM) 1 g in sodium chloride 0.9 % 100 mL IVPB  Status:  Discontinued     1 g 200 mL/hr over 30 Minutes Intravenous Every 8 hours 07/23/16 0919 07/25/16 1223   07/18/16 0000  levofloxacin (LEVAQUIN) IVPB 500 mg  Status:  Discontinued     500 mg 100 mL/hr over 60 Minutes Intravenous Every 24 hours 07/17/16 0903 07/17/16 0938   07/17/16 1100  vancomycin (VANCOCIN) 1,500 mg in sodium chloride 0.9 % 500 mL IVPB     1,500 mg 250 mL/hr over 120 Minutes Intravenous Every 24 hours 07/17/16 0946 07/23/16 1221   07/17/16 1000  levofloxacin (LEVAQUIN) tablet 500 mg  Status:  Discontinued     500 mg Oral Daily 07/17/16 0851 07/17/16 0903   07/15/16 0900  ciprofloxacin (CIPRO) tablet 500 mg  Status:  Discontinued     500 mg Oral 2 times daily 07/15/16 0828 07/17/16 0851   07/14/16 1115  levofloxacin (LEVAQUIN) tablet 500 mg  Status:  Discontinued     500 mg Oral Daily 07/14/16 1107 07/14/16 1214   06/26/16 0830  meropenem (MERREM) 1 g in sodium chloride 0.9 % 100 mL IVPB  Status:  Discontinued     1 g 200 mL/hr over 30 Minutes Intravenous Every 8 hours 06/26/16 0810 07/06/16 0804   06/22/16 1800  clindamycin (CLEOCIN) IVPB 600 mg  Status:  Discontinued     600 mg 100 mL/hr over 30 Minutes Intravenous Every 8 hours 06/22/16 1723 06/26/16 0810   06/22/16 1000  azithromycin (ZITHROMAX) 250 mg in dextrose 5 % 125 mL IVPB  Status:  Discontinued     250 mg 125 mL/hr over 60 Minutes Intravenous Every 24 hours 06/22/16 0826 06/27/16 1019   06/22/16 0900  levofloxacin (LEVAQUIN) IVPB 500 mg   Status:  Discontinued     500 mg 100 mL/hr over 60 Minutes Intravenous Every 24 hours 06/22/16 0759 06/22/16 [place], PA-C [facility] Office: [phone] Pager: [phone] 08/21/2016 7:51 AM    I have independently interviewed patient and agree with PA assessment and plan above. Continue to encourage ambulation and limiting narcotics with po in take as tolerated.   Derricka Mertz C. Donzetta Matters, MD [facility] [place] Office: [phone] Pager: [phone]

## 2016-08-22 ENCOUNTER — Inpatient Hospital Stay (HOSPITAL_COMMUNITY): Payer: BLUE CROSS/BLUE SHIELD

## 2016-08-22 LAB — CBC
HCT: 28.9 % — ABNORMAL LOW (ref 36.0–46.0)
Hemoglobin: 9.3 g/dL — ABNORMAL LOW (ref 12.0–15.0)
MCH: 30.3 pg (ref 26.0–34.0)
MCHC: 32.2 g/dL (ref 30.0–36.0)
MCV: 94.1 fL (ref 78.0–100.0)
Platelets: 199 10*3/uL (ref 150–400)
RBC: 3.07 MIL/uL — ABNORMAL LOW (ref 3.87–5.11)
RDW: 15.7 % — ABNORMAL HIGH (ref 11.5–15.5)
WBC: 18.5 10*3/uL — ABNORMAL HIGH (ref 4.0–10.5)

## 2016-08-22 LAB — GLUCOSE, CAPILLARY
Glucose-Capillary: 103 mg/dL — ABNORMAL HIGH (ref 65–99)
Glucose-Capillary: 105 mg/dL — ABNORMAL HIGH (ref 65–99)
Glucose-Capillary: 132 mg/dL — ABNORMAL HIGH (ref 65–99)

## 2016-08-22 MED ORDER — ALPRAZOLAM 0.5 MG PO TABS: 0.2500 mg | ORAL_TABLET | Freq: Two times a day (BID) | ORAL | 0 refills | Status: AC | PRN

## 2016-08-22 MED ORDER — MOMETASONE FURO-FORMOTEROL FUM 200-5 MCG/ACT IN AERO
2.0000 | INHALATION_SPRAY | Freq: Two times a day (BID) | RESPIRATORY_TRACT | Status: DC
  Administered 2016-08-22: 2 via RESPIRATORY_TRACT
  Filled 2016-08-22: qty 8.8

## 2016-08-22 MED ORDER — OXYCODONE HCL 5 MG PO TABS
10.0000 mg | ORAL_TABLET | Freq: Two times a day (BID) | ORAL | Status: DC | PRN
Start: 2016-08-22 — End: 2016-08-22
  Administered 2016-08-22: 10 mg via ORAL
  Filled 2016-08-22: qty 2

## 2016-08-22 MED ORDER — ASPIRIN 81 MG PO TBEC: 81.0000 mg | DELAYED_RELEASE_TABLET | Freq: Every day | ORAL | Status: AC

## 2016-08-22 MED ORDER — GLIPIZIDE-METFORMIN HCL 5-500 MG PO TABS: 0.5000 | ORAL_TABLET | Freq: Two times a day (BID) | ORAL | 1 refills | Status: AC

## 2016-08-22 MED ORDER — METOPROLOL TARTRATE 37.5 MG PO TABS: 37.5000 mg | ORAL_TABLET | Freq: Two times a day (BID) | ORAL | 1 refills | Status: AC

## 2016-08-22 MED ORDER — IPRATROPIUM-ALBUTEROL 20-100 MCG/ACT IN AERS: 1.0000 | INHALATION_SPRAY | Freq: Four times a day (QID) | RESPIRATORY_TRACT | Status: DC | PRN

## 2016-08-22 MED ORDER — MOMETASONE FURO-FORMOTEROL FUM 200-5 MCG/ACT IN AERO: 2.0000 | INHALATION_SPRAY | Freq: Two times a day (BID) | RESPIRATORY_TRACT | 1 refills | Status: AC

## 2016-08-22 MED ORDER — CLONAZEPAM 1 MG PO TABS: 1.0000 mg | ORAL_TABLET | Freq: Every day | ORAL | 0 refills | Status: AC

## 2016-08-22 MED ORDER — OXYCODONE HCL 10 MG PO TABS: 10.0000 mg | ORAL_TABLET | Freq: Two times a day (BID) | ORAL | 0 refills | Status: AC | PRN

## 2016-08-22 NOTE — Progress Notes (Signed)
Order received to discharge patient.  Telemetry monitor removed and CCMD notified.  PICC removed by IV team.  Discharge instructions, follow up, medications and instructions for their use were discussed with patient.

## 2016-08-22 NOTE — Progress Notes (Signed)
      [address]       [place] [address]             [phone]      16 Days Post-Op Procedure(s) (LRB): THORACIC AORTIC ENDOVASCULAR STENT GRAFT/Thorasic and Abdominal Angiogram, Entravascular ultrasound., Open femoral exposure,Left Brachial access, and patch angioplasty right femoral artery. (N/A) Subjective: C/o some abdominal discomfort, not different from previously , no nausea, + flatus  Objective: Vital signs in last 24 hours: Temp:  [97.4 F (36.3 C)-98.1 F (36.7 C)] 98.1 F (36.7 C) (07/21 0529) Pulse Rate:  [84-107] 107 (07/20 2052) Cardiac Rhythm: Normal sinus rhythm (07/21 0700) Resp:  [11-28] 19 (07/21 0529) BP: (110-140)/(42-54) 133/54 (07/21 0529) SpO2:  [99 %-100 %] 99 % (07/20 1950) FiO2 (%):  [98 %] 98 % (07/20 0905)  Hemodynamic parameters for last 24 hours:    Intake/Output from previous day: No intake/output data recorded. Intake/Output this shift: No intake/output data recorded.  General appearance: alert, cooperative, fatigued and no distress Heart: regular rate and rhythm Lungs: coarse BS Abdomen: soft, mild diffuse tenderness, + BS, no guarding or rebound Extremities: no edema Wound: incis healing well Pulses- DP palp bilat Lab Results:  Recent Labs  08/22/16 0518  WBC 18.5*  HGB 9.3*  HCT 28.9*  PLT 199   BMET:  Recent Labs  08/20/16 0407  CREATININE 1.10*    PT/INR: No results for input(s): LABPROT, INR in the last 72 hours. ABG    Component Value Date/Time   PHART 7.471 (H) 08/06/2016 1830   HCO3 28.3 (H) 08/06/2016 1830   TCO2 29 08/06/2016 1830   ACIDBASEDEF 3.0 (H) 06/28/2016 1615   O2SAT 100.0 08/06/2016 1830   CBG (last 3)   Recent Labs  08/21/16 1759 08/22/16 0018 08/22/16 0608  GLUCAP 93 105* 103*    Meds Scheduled Meds: . arformoterol  15 mcg Nebulization BID  . aspirin EC  81 mg Oral Daily  . budesonide (PULMICORT) nebulizer solution  0.5 mg Nebulization BID  . Chlorhexidine  Gluconate Cloth  6 each Topical Daily  . citalopram  40 mg Oral Daily  . clonazePAM  1 mg Oral QHS  . dextrose  25 mL Intravenous Once  . docusate sodium  100 mg Oral Daily  . enoxaparin (LOVENOX) injection  40 mg Subcutaneous Q24H  . feeding supplement (ENSURE ENLIVE)  237 mL Oral TID WC  . furosemide  20 mg Intravenous Daily  . insulin aspart  0-24 Units Subcutaneous Q6H  . insulin glargine  10 Units Subcutaneous QHS  . magic mouthwash  5 mL Oral QID  . metoprolol tartrate  37.5 mg Oral BID  . pantoprazole  40 mg Oral Daily  . sodium chloride flush  10-40 mL Intracatheter Q12H   Continuous Infusions: . sodium chloride Stopped (08/17/16 0830)  . lactated ringers Stopped (08/07/16 0900)   PRN Meds:.acetaminophen **OR** acetaminophen, ALPRAZolam, alum & mag hydroxide-simeth, Gerhardt's butt cream, guaiFENesin-dextromethorphan, labetalol, metoprolol tartrate, ondansetron (ZOFRAN) IV, oxyCODONE, phenol, pneumococcal 23 valent vaccine, promethazine, simethicone  Xrays No results found.  Assessment/Plan: S/P Procedure(s) (LRB): THORACIC AORTIC ENDOVASCULAR STENT GRAFT/Thorasic and Abdominal Angiogram, Entravascular ultrasound., Open femoral exposure,Left Brachial access, and patch angioplasty right femoral artery. (N/A)  1 hemodyn stable in sinus 2 chronic abd discomfort unchanged 3 home with [facility] assistance, refuses LTACH   LOS: 63 days    Haley Graham 08/22/2016

## 2016-08-22 NOTE — Care Management Note (Addendum)
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 type B dissection- has NGT in place                  Action/Plan: PTA pt lived at home with spouse- referral for FMLA paperwork needed by daughter- have referred her to her mother's primary care doctor regarding this- CM to follow for d/c needs  Expected Discharge Date:  08/22/16               Expected Discharge Plan:  [place] (LTAC)  In-House Referral:     Discharge planning Services  CM Consult  Post Acute Care Choice:  Home Health Choice offered to:  Patient, Spouse  DME Arranged:    DME Agency:     HH Arranged:  RN, PT [place] Agency:  [place]  Status of Service:  Completed, signed off  If discussed at [place] of Stay Meetings, dates discussed:  6/5, 6/7, 6/12, 6/14, 6/19, 6/21  Discharge Disposition:   Additional Comments: 08/22/2016  [facility] accepted referral and informed that pt will discharge today.  Pt will transport home via private vehicle with family.  CM spoke in depth with husband.  Pt has PCP and CM urged family to contact PCP on Monday to schedule follow up appt  08/21/16 Pts husband chose [facility] for agency for [facility] - pt already has recommended equipment in the home.  CM requested [place] orders and will arrange [facility]  [employer] has approved LTACH however Pt now adamantly refusing LTACH to discharge straight home - even with attending at bedside.   Both pts husband and daughter would prefer pt to discharge to [facility] however husband is prepared for pt to discharge home (husband and mother in law will provide 24 hour supervision).   CM left another [place] list in pts room and asked for her and husband to decide on agency in preparation for home  discharge.    Family/pt agreed for [facility] to submit insurance auth 7/19 - however this am CM informed that preference is for pt to return home with supervision - family is okay to continue with insurance auth for [facility].  CM spoke directly with Dr Roxy Manns - pt will be transferred to 4 E and will continue to increase PO intake  Per night nurse pt did ambulate around the unit early this am, still not taking in adequate nutrition, received IV zofran and PO pain medications.  Recommendation continues to be HH from in house therapies.  LTACH referral submitted due to lack of progression and LOS.  08/20/16 CM contacted attending to request clarification with nutrition plan (pt is not taking in enough to fill nutrition requirements and all supplemental nutrition has been discontinued).  Plan is to continue to encourage pt to take more and more intake and add ensure - if pt is unable then cortrak will be replaced - there is no plan for TPN.  LTACH referral approved by physician advisor due to lack of progression and LOS - both agencies given referral.  [facility] declined to offer bed, [facility] offered bed.  CM discussed directly with pt and daughter - [facility] Liaison to meet with daughter and family  Discussed in LOS 08/18/16 - pt remains appropriate for continued stay.  Tube feeds reinitated overnight however pt not tolerating well - will get IV nausea medications.  08/18/16 Pt remains on IV lasix and PRN IV dilaudid,  persitent complaints of abdominal pain that is limiting activity.  CM spoke with CIR - it is not likely that pt will be deemed appropriate and even less likely that insurance will cover.  CM spoke directly with with pt and mother in law at bedside - mother in law and other family members are planning on staying with pt at discharge (however reiterated to pt that she will need to mostly independent at discharge for family to feel comfortable).  Pt was completely independent from home with husband.  Pt  has cortrak however refusing feeds  08/13/16 Discussed in LOS 7/12 - remains appropriate for continued stay.  PT/OT both recommend [facility] - requested orders via physician sticky tab  08/12/16 Elenor Quinones, RN, BSN [phone] 5 days s/p TAEVR.  TPN was reduced to 50 ml/hr yesterday night when TF was initiated (plan is to wean pt off TPN).  Pt is also slowing progressing on liquid diet.  Recommendations currently are for [facility] and DME - orders will be written closer to discharge so arrangements can be made.  Daughter contacted CIR directly to request admittance consideration.  07/23/16- 1000- Kristi Webster RN, CM- pt continues with prolonged ileus and ischemia - on TPA and only sips of clears- plan per notes for possible thoracic aortic endovascular stent later this week.  Per PT recommendations for [facility] f/u - will need orders prior to discharge- CM will continue to follow for d/c needs when medically ready.   07/07/16- 1245- Kristi Webster RN, CM- pt remains- NPO, TNA- per MD note- plan repeat CT abd-pelvis to assess bowel viability- CM to continue to follow.   Maryclare Labrador, RN 08/22/2016, 8:20 AM

## 2016-08-22 NOTE — Progress Notes (Addendum)
   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 requiring motility studies at some point  Pt does not demonstrate any evidence of intestinal malperfusion at this time   Subjective  - 16 Days Post-Op   Small non-bloody, non-melanotic BM, +flatus, continued vague abd pain   Objective   Vitals:   08/21/16 1720 08/21/16 1950 08/21/16 2052 08/22/16 0529  BP: (!) 111/42  (!) 140/49 (!) 133/54  Pulse: 89  (!) 107   Resp: 19  18 19   Temp: 97.6 F (36.4 C)  (!) 97.4 F (36.3 C) 98.1 F (36.7 C)  TempSrc: Oral  Oral Oral  SpO2: 100% 99%    Weight:      Height:        No intake or output data in the 24 hours ending 08/22/16 0826  GI  soft, mild distension, +pannus, +mild epigastric TTP, -G/R  VASC R groin incision healed, no hematoma, no echymosis    Laboratory   CBC CBC Latest Ref Rng & Units 08/22/2016 08/18/2016 08/17/2016  WBC 4.0 - 10.5 K/uL 18.5(H) 15.3(H) 15.1(H)  Hemoglobin 12.0 - 15.0 g/dL 9.3(L) 9.8(L) 9.7(L)  Hematocrit 36.0 - 46.0 % 28.9(L) 30.5(L) 30.2(L)  Platelets 150 - 400 K/uL 199 233 214    BMET    Component Value Date/Time   NA 131 (L) 08/18/2016 0540   K 3.8 08/18/2016 0540   CL 101 08/18/2016 0540   CO2 24 08/18/2016 0540   GLUCOSE 132 (H) 08/18/2016 0540   BUN 16 08/18/2016 0540   CREATININE 1.10 (H) 08/20/2016 0407   CALCIUM 7.4 (L) 08/18/2016 0540   GFRNONAA 54 (L) 08/20/2016 0407   GFRAA >60 08/20/2016 0407    Adele Barthel, MD, FACS [facility] Office: [phone] Pager: [phone]  08/22/2016, 8:26 AM   Addendum  Radiology     Dg Abd 1 View  Result Date: 08/22/2016 CLINICAL DATA:  Lower abdominal pain since a thoracic aortic endovascular stent graft placed on 08/06/2016, history  hypertension, diabetes mellitus, irritable bowel syndrome, GERD EXAM: ABDOMEN - 1 VIEW COMPARISON:  08/17/2016 FINDINGS: Feeding tube removed. Descending thoracic aortic endo stent noted. Scattered gas throughout colon to rectum. Again identified gas filled small bowel loops in the mid abdomen, though less distended than on previous exam. Questionable bowel wall thickening of a small bowel loop in the lateral RIGHT mid abdomen. No evidence of obstruction. Osseous structures unremarkable. IMPRESSION: Gas throughout colon to rectum. Decreased small bowel distention since previous exam with questionable mild wall thickening of a a small bowel loop in the lateral RIGHT mid abdomen. Electronically Signed   By: Lavonia Dana M.D.   On: 08/22/2016 09:30   On review of this patient's KUB, patient appears to have improvement in motility as evident by resolution of much of gastric bubble.  There remains substantial amount of air in intestines, so some degree of ileus likely still present.  I'm not certain what is being referred to as wall thickening, but I don't think there any clinical signs to justify CT abd/pelvis for now.   Adele Barthel, MD, FACS [facility] [place] Office: [phone] Pager: [phone]  08/22/2016, 10:24 AM

## 2016-08-22 NOTE — Discharge Instructions (Signed)
Thoracic Aortic Aneurysm An aneurysm is a bulge in an artery. It happens when blood pushes up against a weakened or damaged artery wall. A thoracic aortic aneurysm is an aneurysm that occurs in the first part of the aorta, between the heart and the diaphragm. The aorta is the main artery of the body. It supplies blood from the heart to the rest of the body. Some aneurysms may not cause symptoms or problems. However, the major concern with a thoracic aortic aneurysm is that it can enlarge and burst (rupture), or blood can flow between the layers of the wall of the aorta through a tear (aorticdissection). Both of these conditions can cause bleeding inside the body and can be life-threatening if they are not diagnosed and treated right away. What are the causes? The exact cause of this condition is not known. What increases the risk? The following factors may make you more likely to develop this condition:  Being age 65 or older.  Having a hardening of the arteries caused by the buildup of fat and other substances in the lining of a blood vessel (arteriosclerosis).  Having inflammation of the walls of an artery (arteritis).  Having a genetic disease that weakens the body's connective tissue, such as Marfan syndrome.  Having an injury or trauma to the aorta.  Having an infection that is caused by bacteria, such as syphilis or staphylococcus, in the wall of the aorta (infectious aortitis).  Having high blood pressure (hypertension).  Being female.  Being white (Caucasian).  Having high cholesterol.  Having a family history of aneurysms.  Using tobacco.  Having chronic obstructive pulmonary disease (COPD).  What are the signs or symptoms? Symptoms of this condition vary depending on the size and rate of growth of the aneurysm. Most grow slowly and do not cause any symptoms. When symptoms do occur, they may include:  Pain in the chest, back, sides, or abdomen. The pain may vary in  intensity. A sudden onset of severe pain may indicate that the aneurysm has ruptured.  Hoarseness.  Cough.  Shortness of breath.  Swallowing problems.  Swelling in the face, arms, or legs.  Fever.  Unexplained weight loss.  How is this diagnosed? This condition may be diagnosed with:  An ultrasound.  X-rays.  A CT scan.  An MRI.  Tests to check the arteries for damage or blockages (angiogram).  Most unruptured thoracic aortic aneurysms cause no symptoms, so they are often found during exams for other conditions. How is this treated? Treatment for this condition depends on:  The size of the aneurysm.  How fast the aneurysm is growing.  Your age.  Risk factors for rupture.  Aneurysms that are smaller than 2.2 inches (5.5 cm) may be managed by using medicines to control blood pressure, manage pain, or fight infection. You may need regular monitoring to see if the aneurysm is getting bigger. Your health care provider may recommend that you have an ultrasound every year or every 6 months. How often you need to have an ultrasound depends on the size of the aneurysm, how fast it is growing, and whether you have a family history of aneurysms. Surgical repair may be needed if your aneurysm is larger than 2.2 inches or if it is growing quickly. Follow these instructions at home: Eating and drinking  Eat a healthy diet. Your health care provider may recommend that you: ? Lower your salt (sodium) intake. In some people, too much salt can raise blood pressure and increase   the risk of thoracic aortic aneurysm. ? Avoid foods that are high in saturated fat and cholesterol, such as red meat and dairy. ? Eat a diet that is low in sugar. ? Increase your fiber intake by including whole grains, vegetables, and fruits in your diet. Eating these foods may help to lower blood pressure.  Limit or avoid alcohol as recommended by your health care provider. Lifestyle  Follow instructions  from your health care provider about healthy lifestyle habits. Your health care provider may recommend that you: ? Do not use any products that contain nicotine or tobacco, such as cigarettes and e-cigarettes. If you need help quitting, ask your health care provider. ? Keep your blood pressure within normal limits. The target limit for most people is below 120/80. Check your blood pressure regularly. If it is high, ask your health care provider about ways that you can control it. ? Keep your blood sugar (glucose) level and cholesterol levels within normal limits. Target limits for most people are:  Blood glucose level: Less than 100 mg/dL.  Total cholesterol level: Less than 200 mg/dL. ? Maintain a healthy weight. Activity  Stay physically active and exercise regularly. Talk with your health care provider about how often you should exercise and ask which types of exercise are safe for you.  Avoid heavy lifting and activities that take a lot of effort (are strenuous). Ask your health care provider what activities are safe for you. General instructions  Keep all follow-up visits as told by your health care provider. This is important. ? Talk with your health care provider about regular screenings to see if the aneurysm is getting bigger.  Take over-the-counter and prescription medicines only as told by your health care provider. Contact a health care provider if:  You have discomfort in your upper back, neck, or abdomen.  You have trouble swallowing.  You have a cough or hoarseness.  You have a family history of aneurysms.  You have unexplained weight loss. Get help right away if:  You have sudden, severe pain in your upper back and abdomen. This pain may move into your chest and arms.  You have shortness of breath.  You have a fever. This information is not intended to replace advice given to you by your health care provider. Make sure you discuss any questions you have with your  health care provider. Document Released: 01/19/2005 Document Revised: 11/01/2015 Document Reviewed: 11/01/2015 Elsevier Interactive Patient Education  2018 [employer]  

## 2016-08-25 ENCOUNTER — Other Ambulatory Visit: Payer: Self-pay

## 2016-08-25 DIAGNOSIS — I7101 Dissection of thoracic aorta: Secondary | ICD-10-CM

## 2016-08-25 DIAGNOSIS — I7103 Dissection of thoracoabdominal aorta: Secondary | ICD-10-CM

## 2016-08-25 DIAGNOSIS — I71019 Dissection of thoracic aorta, unspecified: Secondary | ICD-10-CM

## 2016-08-26 ENCOUNTER — Telehealth: Payer: Self-pay | Admitting: Vascular Surgery

## 2016-08-26 NOTE — Telephone Encounter (Signed)
-----   Message from Mena Goes, RN sent at 08/26/2016 11:14 AM EDT ----- Regarding: RE: 4 weeks w/ CTA  Yes that's correct, thanks ----- Message ----- From: Georgiann Mccoy Sent: 08/26/2016  10:36 AM To: Mena Goes, RN Subject: RE: 4 weeks w/ CTA                             Should it be both at CTA Chest and CTA Abd/Pel?  ----- Message ----- From: Mena Goes, RN Sent: 08/24/2016   8:48 AM To: [facility] Admin Pool Subject: 4 weeks w/ CTA                                   ----- Message ----- From: Gabriel Earing, PA-C Sent: 08/24/2016   7:10 AM To: [facility] Charge Pool  S/p TEVAR--f/u with Dr. Donzetta Matters in 4 weeks with CTA protocol.  Thanks.

## 2016-08-26 NOTE — Telephone Encounter (Signed)
Sched CTA 09/28/16 at 1:00 at [facility]. Sched MD 09/19/2016 at 8:30. Spoke to pt's husband.

## 2016-08-27 ENCOUNTER — Telehealth: Payer: Self-pay | Admitting: *Deleted

## 2016-08-27 NOTE — Telephone Encounter (Signed)
Mr. Butrick called at 64, to report that his wife was having nausea and had no medications prescribed at discharge for this. Patient has a history of numerous abdominal issues including gastroparesis. I paged Dr. Bridgett Larsson who is on call today and he said to bring her in to see Dr. Donzetta Matters tomorrow as a work in. According to Dr. Bridgett Larsson, this is an ongoing problem for the patient.  I tried to call her back at 1705 but had to leave a message for the patient to call our office in the morning and we will tell her what time to come in to see Dr.Cain.

## 2016-08-28 ENCOUNTER — Telehealth: Payer: Self-pay | Admitting: *Deleted

## 2016-08-28 NOTE — Telephone Encounter (Signed)
Returned call to patient.  Spoke with husband and he states that patient was feeling better and that Zofran had been called in for her and this is helping her nausea.   Husband is to call the office if patients symptoms should worsen.

## 2016-09-01 ENCOUNTER — Telehealth: Payer: Self-pay | Admitting: *Deleted

## 2016-09-01 ENCOUNTER — Other Ambulatory Visit: Payer: Self-pay | Admitting: *Deleted

## 2016-09-01 DIAGNOSIS — G8918 Other acute postprocedural pain: Secondary | ICD-10-CM

## 2016-09-01 MED ORDER — TRAMADOL HCL 50 MG PO TABS: 50.0000 mg | ORAL_TABLET | Freq: Four times a day (QID) | ORAL | 0 refills | Status: AC | PRN

## 2016-09-01 NOTE — Telephone Encounter (Signed)
Mr. Bridgeforth has called  with a request for a less strong pain med for his wife. He says the Oxycodone is too sedating even when he cuts it in half. He is requesting Tramadol. I consulted with Ellwood Handler, PA-C and she was in agreement to prescribe the Tramadol. She did warn that Haley Graham had been on narcotics for around two months while hospitalized and withdrawal may occur. Mr. Sandall said she had not had any narcotics in three days. He is aware the Tramadol has been prescribed to their pharmacy.

## 2016-09-08 ENCOUNTER — Other Ambulatory Visit: Payer: Self-pay | Admitting: Cardiothoracic Surgery

## 2016-09-08 DIAGNOSIS — I7101 Dissection of thoracic aorta: Secondary | ICD-10-CM

## 2016-09-08 DIAGNOSIS — I71019 Dissection of thoracic aorta, unspecified: Secondary | ICD-10-CM

## 2016-09-09 ENCOUNTER — Encounter: Payer: Self-pay | Admitting: Cardiothoracic Surgery

## 2016-09-09 ENCOUNTER — Inpatient Hospital Stay (HOSPITAL_COMMUNITY)
Admission: EM | Admit: 2016-09-09 | Discharge: 2016-11-02 | DRG: 871 | Disposition: E | Payer: BLUE CROSS/BLUE SHIELD | Attending: Internal Medicine | Admitting: Internal Medicine

## 2016-09-09 ENCOUNTER — Encounter (HOSPITAL_COMMUNITY): Payer: Self-pay | Admitting: Emergency Medicine

## 2016-09-09 ENCOUNTER — Emergency Department (HOSPITAL_COMMUNITY): Payer: BLUE CROSS/BLUE SHIELD

## 2016-09-09 ENCOUNTER — Ambulatory Visit
Admission: RE | Admit: 2016-09-09 | Discharge: 2016-09-09 | Disposition: A | Discharge status: Home / Self Care | Payer: BLUE CROSS/BLUE SHIELD | Source: Ambulatory Visit | Attending: Cardiothoracic Surgery | Admitting: Cardiothoracic Surgery

## 2016-09-09 ENCOUNTER — Ambulatory Visit (INDEPENDENT_AMBULATORY_CARE_PROVIDER_SITE_OTHER): Payer: BLUE CROSS/BLUE SHIELD | Admitting: Cardiothoracic Surgery

## 2016-09-09 VITALS — BP 109/59 | HR 94 | Resp 20 | Ht 63.0 in | Wt 159.0 lb

## 2016-09-09 DIAGNOSIS — I71019 Dissection of thoracic aorta, unspecified: Secondary | ICD-10-CM

## 2016-09-09 DIAGNOSIS — K56600 Partial intestinal obstruction, unspecified as to cause: Secondary | ICD-10-CM | POA: Diagnosis present

## 2016-09-09 DIAGNOSIS — F418 Other specified anxiety disorders: Secondary | ICD-10-CM | POA: Diagnosis present

## 2016-09-09 DIAGNOSIS — D638 Anemia in other chronic diseases classified elsewhere: Secondary | ICD-10-CM | POA: Diagnosis present

## 2016-09-09 DIAGNOSIS — D696 Thrombocytopenia, unspecified: Secondary | ICD-10-CM | POA: Diagnosis present

## 2016-09-09 DIAGNOSIS — I5021 Acute systolic (congestive) heart failure: Secondary | ICD-10-CM | POA: Diagnosis not present

## 2016-09-09 DIAGNOSIS — I509 Heart failure, unspecified: Secondary | ICD-10-CM | POA: Diagnosis not present

## 2016-09-09 DIAGNOSIS — Z885 Allergy status to narcotic agent status: Secondary | ICD-10-CM

## 2016-09-09 DIAGNOSIS — J90 Pleural effusion, not elsewhere classified: Secondary | ICD-10-CM

## 2016-09-09 DIAGNOSIS — I959 Hypotension, unspecified: Secondary | ICD-10-CM | POA: Diagnosis not present

## 2016-09-09 DIAGNOSIS — R9430 Abnormal result of cardiovascular function study, unspecified: Secondary | ICD-10-CM | POA: Diagnosis not present

## 2016-09-09 DIAGNOSIS — D649 Anemia, unspecified: Secondary | ICD-10-CM | POA: Diagnosis present

## 2016-09-09 DIAGNOSIS — K560 Paralytic ileus: Secondary | ICD-10-CM | POA: Diagnosis present

## 2016-09-09 DIAGNOSIS — K219 Gastro-esophageal reflux disease without esophagitis: Secondary | ICD-10-CM | POA: Diagnosis present

## 2016-09-09 DIAGNOSIS — F17210 Nicotine dependence, cigarettes, uncomplicated: Secondary | ICD-10-CM | POA: Diagnosis present

## 2016-09-09 DIAGNOSIS — Z7951 Long term (current) use of inhaled steroids: Secondary | ICD-10-CM

## 2016-09-09 DIAGNOSIS — E1122 Type 2 diabetes mellitus with diabetic chronic kidney disease: Secondary | ICD-10-CM | POA: Diagnosis present

## 2016-09-09 DIAGNOSIS — Z66 Do not resuscitate: Secondary | ICD-10-CM | POA: Diagnosis not present

## 2016-09-09 DIAGNOSIS — R0602 Shortness of breath: Secondary | ICD-10-CM | POA: Diagnosis not present

## 2016-09-09 DIAGNOSIS — I248 Other forms of acute ischemic heart disease: Secondary | ICD-10-CM

## 2016-09-09 DIAGNOSIS — N182 Chronic kidney disease, stage 2 (mild): Secondary | ICD-10-CM | POA: Diagnosis present

## 2016-09-09 DIAGNOSIS — I501 Left ventricular failure: Secondary | ICD-10-CM | POA: Diagnosis not present

## 2016-09-09 DIAGNOSIS — Z9049 Acquired absence of other specified parts of digestive tract: Secondary | ICD-10-CM

## 2016-09-09 DIAGNOSIS — J690 Pneumonitis due to inhalation of food and vomit: Secondary | ICD-10-CM | POA: Diagnosis not present

## 2016-09-09 DIAGNOSIS — R1084 Generalized abdominal pain: Secondary | ICD-10-CM | POA: Diagnosis present

## 2016-09-09 DIAGNOSIS — E669 Obesity, unspecified: Secondary | ICD-10-CM | POA: Diagnosis present

## 2016-09-09 DIAGNOSIS — E860 Dehydration: Secondary | ICD-10-CM | POA: Diagnosis present

## 2016-09-09 DIAGNOSIS — R531 Weakness: Secondary | ICD-10-CM | POA: Diagnosis not present

## 2016-09-09 DIAGNOSIS — Z90710 Acquired absence of both cervix and uterus: Secondary | ICD-10-CM

## 2016-09-09 DIAGNOSIS — E43 Unspecified severe protein-calorie malnutrition: Secondary | ICD-10-CM | POA: Diagnosis not present

## 2016-09-09 DIAGNOSIS — R195 Other fecal abnormalities: Secondary | ICD-10-CM | POA: Diagnosis not present

## 2016-09-09 DIAGNOSIS — R109 Unspecified abdominal pain: Secondary | ICD-10-CM | POA: Diagnosis not present

## 2016-09-09 DIAGNOSIS — R0609 Other forms of dyspnea: Secondary | ICD-10-CM | POA: Diagnosis not present

## 2016-09-09 DIAGNOSIS — F411 Generalized anxiety disorder: Secondary | ICD-10-CM | POA: Diagnosis not present

## 2016-09-09 DIAGNOSIS — I5032 Chronic diastolic (congestive) heart failure: Secondary | ICD-10-CM | POA: Diagnosis not present

## 2016-09-09 DIAGNOSIS — N179 Acute kidney failure, unspecified: Secondary | ICD-10-CM | POA: Diagnosis present

## 2016-09-09 DIAGNOSIS — Z5320 Procedure and treatment not carried out because of patient's decision for unspecified reasons: Secondary | ICD-10-CM | POA: Diagnosis present

## 2016-09-09 DIAGNOSIS — E1169 Type 2 diabetes mellitus with other specified complication: Secondary | ICD-10-CM | POA: Diagnosis present

## 2016-09-09 DIAGNOSIS — E876 Hypokalemia: Secondary | ICD-10-CM

## 2016-09-09 DIAGNOSIS — B377 Candidal sepsis: Secondary | ICD-10-CM | POA: Diagnosis not present

## 2016-09-09 DIAGNOSIS — Z515 Encounter for palliative care: Secondary | ICD-10-CM

## 2016-09-09 DIAGNOSIS — K529 Noninfective gastroenteritis and colitis, unspecified: Secondary | ICD-10-CM | POA: Diagnosis not present

## 2016-09-09 DIAGNOSIS — R1110 Vomiting, unspecified: Secondary | ICD-10-CM

## 2016-09-09 DIAGNOSIS — I10 Essential (primary) hypertension: Secondary | ICD-10-CM

## 2016-09-09 DIAGNOSIS — F13239 Sedative, hypnotic or anxiolytic dependence with withdrawal, unspecified: Secondary | ICD-10-CM | POA: Diagnosis present

## 2016-09-09 DIAGNOSIS — J9602 Acute respiratory failure with hypercapnia: Secondary | ICD-10-CM | POA: Diagnosis present

## 2016-09-09 DIAGNOSIS — F329 Major depressive disorder, single episode, unspecified: Secondary | ICD-10-CM

## 2016-09-09 DIAGNOSIS — I82612 Acute embolism and thrombosis of superficial veins of left upper extremity: Secondary | ICD-10-CM | POA: Diagnosis present

## 2016-09-09 DIAGNOSIS — F05 Delirium due to known physiological condition: Secondary | ICD-10-CM | POA: Diagnosis present

## 2016-09-09 DIAGNOSIS — I7103 Dissection of thoracoabdominal aorta: Secondary | ICD-10-CM | POA: Diagnosis not present

## 2016-09-09 DIAGNOSIS — Z888 Allergy status to other drugs, medicaments and biological substances status: Secondary | ICD-10-CM

## 2016-09-09 DIAGNOSIS — F3289 Other specified depressive episodes: Secondary | ICD-10-CM | POA: Diagnosis not present

## 2016-09-09 DIAGNOSIS — I130 Hypertensive heart and chronic kidney disease with heart failure and stage 1 through stage 4 chronic kidney disease, or unspecified chronic kidney disease: Secondary | ICD-10-CM | POA: Diagnosis present

## 2016-09-09 DIAGNOSIS — J9601 Acute respiratory failure with hypoxia: Secondary | ICD-10-CM | POA: Diagnosis not present

## 2016-09-09 DIAGNOSIS — R9431 Abnormal electrocardiogram [ECG] [EKG]: Secondary | ICD-10-CM | POA: Diagnosis not present

## 2016-09-09 DIAGNOSIS — G931 Anoxic brain damage, not elsewhere classified: Secondary | ICD-10-CM | POA: Diagnosis not present

## 2016-09-09 DIAGNOSIS — I2489 Other forms of acute ischemic heart disease: Secondary | ICD-10-CM

## 2016-09-09 DIAGNOSIS — J449 Chronic obstructive pulmonary disease, unspecified: Secondary | ICD-10-CM | POA: Diagnosis present

## 2016-09-09 DIAGNOSIS — Z0189 Encounter for other specified special examinations: Secondary | ICD-10-CM

## 2016-09-09 DIAGNOSIS — Z9889 Other specified postprocedural states: Secondary | ICD-10-CM | POA: Diagnosis not present

## 2016-09-09 DIAGNOSIS — R000 Tachycardia, unspecified: Secondary | ICD-10-CM | POA: Diagnosis not present

## 2016-09-09 DIAGNOSIS — Z9911 Dependence on respirator [ventilator] status: Secondary | ICD-10-CM

## 2016-09-09 DIAGNOSIS — I5043 Acute on chronic combined systolic (congestive) and diastolic (congestive) heart failure: Secondary | ICD-10-CM | POA: Diagnosis present

## 2016-09-09 DIAGNOSIS — I255 Ischemic cardiomyopathy: Secondary | ICD-10-CM | POA: Diagnosis not present

## 2016-09-09 DIAGNOSIS — I714 Abdominal aortic aneurysm, without rupture, unspecified: Secondary | ICD-10-CM | POA: Diagnosis present

## 2016-09-09 DIAGNOSIS — G8929 Other chronic pain: Secondary | ICD-10-CM | POA: Diagnosis present

## 2016-09-09 DIAGNOSIS — Z86718 Personal history of other venous thrombosis and embolism: Secondary | ICD-10-CM

## 2016-09-09 DIAGNOSIS — Z8249 Family history of ischemic heart disease and other diseases of the circulatory system: Secondary | ICD-10-CM

## 2016-09-09 DIAGNOSIS — D72829 Elevated white blood cell count, unspecified: Secondary | ICD-10-CM | POA: Diagnosis present

## 2016-09-09 DIAGNOSIS — Z8679 Personal history of other diseases of the circulatory system: Secondary | ICD-10-CM

## 2016-09-09 DIAGNOSIS — M1990 Unspecified osteoarthritis, unspecified site: Secondary | ICD-10-CM | POA: Diagnosis present

## 2016-09-09 DIAGNOSIS — I495 Sick sinus syndrome: Secondary | ICD-10-CM | POA: Diagnosis not present

## 2016-09-09 DIAGNOSIS — K551 Chronic vascular disorders of intestine: Secondary | ICD-10-CM | POA: Diagnosis present

## 2016-09-09 DIAGNOSIS — J9690 Respiratory failure, unspecified, unspecified whether with hypoxia or hypercapnia: Secondary | ICD-10-CM

## 2016-09-09 DIAGNOSIS — Z809 Family history of malignant neoplasm, unspecified: Secondary | ICD-10-CM

## 2016-09-09 DIAGNOSIS — G9340 Encephalopathy, unspecified: Secondary | ICD-10-CM | POA: Diagnosis not present

## 2016-09-09 DIAGNOSIS — R441 Visual hallucinations: Secondary | ICD-10-CM

## 2016-09-09 DIAGNOSIS — R935 Abnormal findings on diagnostic imaging of other abdominal regions, including retroperitoneum: Secondary | ICD-10-CM | POA: Diagnosis present

## 2016-09-09 DIAGNOSIS — Z4659 Encounter for fitting and adjustment of other gastrointestinal appliance and device: Secondary | ICD-10-CM

## 2016-09-09 DIAGNOSIS — N280 Ischemia and infarction of kidney: Secondary | ICD-10-CM

## 2016-09-09 DIAGNOSIS — E861 Hypovolemia: Secondary | ICD-10-CM | POA: Diagnosis present

## 2016-09-09 DIAGNOSIS — F332 Major depressive disorder, recurrent severe without psychotic features: Secondary | ICD-10-CM | POA: Diagnosis not present

## 2016-09-09 DIAGNOSIS — K56609 Unspecified intestinal obstruction, unspecified as to partial versus complete obstruction: Secondary | ICD-10-CM | POA: Diagnosis not present

## 2016-09-09 DIAGNOSIS — R52 Pain, unspecified: Secondary | ICD-10-CM

## 2016-09-09 DIAGNOSIS — Z978 Presence of other specified devices: Secondary | ICD-10-CM | POA: Diagnosis not present

## 2016-09-09 DIAGNOSIS — G9341 Metabolic encephalopathy: Secondary | ICD-10-CM | POA: Diagnosis not present

## 2016-09-09 DIAGNOSIS — Z7982 Long term (current) use of aspirin: Secondary | ICD-10-CM

## 2016-09-09 DIAGNOSIS — I428 Other cardiomyopathies: Secondary | ICD-10-CM | POA: Diagnosis not present

## 2016-09-09 DIAGNOSIS — I7101 Dissection of thoracic aorta: Secondary | ICD-10-CM | POA: Diagnosis not present

## 2016-09-09 DIAGNOSIS — A419 Sepsis, unspecified organism: Secondary | ICD-10-CM | POA: Diagnosis not present

## 2016-09-09 DIAGNOSIS — Z79899 Other long term (current) drug therapy: Secondary | ICD-10-CM

## 2016-09-09 DIAGNOSIS — K3184 Gastroparesis: Secondary | ICD-10-CM | POA: Diagnosis present

## 2016-09-09 DIAGNOSIS — I429 Cardiomyopathy, unspecified: Secondary | ICD-10-CM | POA: Diagnosis not present

## 2016-09-09 DIAGNOSIS — J810 Acute pulmonary edema: Secondary | ICD-10-CM | POA: Diagnosis not present

## 2016-09-09 DIAGNOSIS — D6959 Other secondary thrombocytopenia: Secondary | ICD-10-CM | POA: Diagnosis present

## 2016-09-09 DIAGNOSIS — B49 Unspecified mycosis: Secondary | ICD-10-CM | POA: Diagnosis not present

## 2016-09-09 DIAGNOSIS — Z6827 Body mass index (BMI) 27.0-27.9, adult: Secondary | ICD-10-CM

## 2016-09-09 DIAGNOSIS — R188 Other ascites: Secondary | ICD-10-CM | POA: Diagnosis present

## 2016-09-09 DIAGNOSIS — R931 Abnormal findings on diagnostic imaging of heart and coronary circulation: Secondary | ICD-10-CM

## 2016-09-09 DIAGNOSIS — R627 Adult failure to thrive: Secondary | ICD-10-CM

## 2016-09-09 DIAGNOSIS — J811 Chronic pulmonary edema: Secondary | ICD-10-CM

## 2016-09-09 DIAGNOSIS — D539 Nutritional anemia, unspecified: Secondary | ICD-10-CM | POA: Diagnosis present

## 2016-09-09 DIAGNOSIS — E119 Type 2 diabetes mellitus without complications: Secondary | ICD-10-CM | POA: Diagnosis not present

## 2016-09-09 DIAGNOSIS — K760 Fatty (change of) liver, not elsewhere classified: Secondary | ICD-10-CM | POA: Diagnosis present

## 2016-09-09 DIAGNOSIS — R319 Hematuria, unspecified: Secondary | ICD-10-CM

## 2016-09-09 DIAGNOSIS — E871 Hypo-osmolality and hyponatremia: Secondary | ICD-10-CM | POA: Diagnosis not present

## 2016-09-09 DIAGNOSIS — J9811 Atelectasis: Secondary | ICD-10-CM | POA: Diagnosis not present

## 2016-09-09 DIAGNOSIS — I469 Cardiac arrest, cause unspecified: Secondary | ICD-10-CM | POA: Diagnosis not present

## 2016-09-09 DIAGNOSIS — I4891 Unspecified atrial fibrillation: Secondary | ICD-10-CM | POA: Diagnosis present

## 2016-09-09 DIAGNOSIS — D126 Benign neoplasm of colon, unspecified: Secondary | ICD-10-CM | POA: Diagnosis present

## 2016-09-09 DIAGNOSIS — Z823 Family history of stroke: Secondary | ICD-10-CM

## 2016-09-09 DIAGNOSIS — D735 Infarction of spleen: Secondary | ICD-10-CM | POA: Diagnosis not present

## 2016-09-09 DIAGNOSIS — R001 Bradycardia, unspecified: Secondary | ICD-10-CM | POA: Diagnosis present

## 2016-09-09 DIAGNOSIS — R0603 Acute respiratory distress: Secondary | ICD-10-CM

## 2016-09-09 DIAGNOSIS — R0902 Hypoxemia: Secondary | ICD-10-CM

## 2016-09-09 DIAGNOSIS — R748 Abnormal levels of other serum enzymes: Secondary | ICD-10-CM

## 2016-09-09 DIAGNOSIS — E1143 Type 2 diabetes mellitus with diabetic autonomic (poly)neuropathy: Secondary | ICD-10-CM | POA: Diagnosis present

## 2016-09-09 DIAGNOSIS — D3502 Benign neoplasm of left adrenal gland: Secondary | ICD-10-CM | POA: Diagnosis present

## 2016-09-09 DIAGNOSIS — F32A Depression, unspecified: Secondary | ICD-10-CM

## 2016-09-09 DIAGNOSIS — B3749 Other urogenital candidiasis: Secondary | ICD-10-CM | POA: Diagnosis present

## 2016-09-09 DIAGNOSIS — I2510 Atherosclerotic heart disease of native coronary artery without angina pectoris: Secondary | ICD-10-CM | POA: Diagnosis not present

## 2016-09-09 DIAGNOSIS — D692 Other nonthrombocytopenic purpura: Secondary | ICD-10-CM | POA: Diagnosis present

## 2016-09-09 DIAGNOSIS — R40243 Glasgow coma scale score 3-8, unspecified time: Secondary | ICD-10-CM | POA: Diagnosis not present

## 2016-09-09 DIAGNOSIS — Z7189 Other specified counseling: Secondary | ICD-10-CM

## 2016-09-09 DIAGNOSIS — R110 Nausea: Secondary | ICD-10-CM | POA: Diagnosis not present

## 2016-09-09 DIAGNOSIS — R0989 Other specified symptoms and signs involving the circulatory and respiratory systems: Secondary | ICD-10-CM

## 2016-09-09 DIAGNOSIS — Z880 Allergy status to penicillin: Secondary | ICD-10-CM

## 2016-09-09 DIAGNOSIS — I341 Nonrheumatic mitral (valve) prolapse: Secondary | ICD-10-CM | POA: Diagnosis not present

## 2016-09-09 LAB — URINALYSIS, ROUTINE W REFLEX MICROSCOPIC
BILIRUBIN URINE: NEGATIVE
Glucose, UA: NEGATIVE mg/dL
HGB URINE DIPSTICK: NEGATIVE
Ketones, ur: NEGATIVE mg/dL
LEUKOCYTES UA: NEGATIVE
Nitrite: POSITIVE — AB
PROTEIN: NEGATIVE mg/dL
Specific Gravity, Urine: 1.033 — ABNORMAL HIGH (ref 1.005–1.030)
pH: 6 (ref 5.0–8.0)

## 2016-09-09 LAB — CBC WITH DIFFERENTIAL/PLATELET
BASOS ABS: 0 10*3/uL (ref 0.0–0.1)
Basophils Relative: 0 %
Eosinophils Absolute: 0 10*3/uL (ref 0.0–0.7)
Eosinophils Relative: 0 %
HEMATOCRIT: 30.3 % — AB (ref 36.0–46.0)
Hemoglobin: 10.1 g/dL — ABNORMAL LOW (ref 12.0–15.0)
LYMPHS ABS: 1.9 10*3/uL (ref 0.7–4.0)
LYMPHS PCT: 9 %
MCH: 31.5 pg (ref 26.0–34.0)
MCHC: 33.3 g/dL (ref 30.0–36.0)
MCV: 94.4 fL (ref 78.0–100.0)
MONO ABS: 0.9 10*3/uL (ref 0.1–1.0)
MONOS PCT: 4 %
NEUTROS ABS: 18.2 10*3/uL — AB (ref 1.7–7.7)
Neutrophils Relative %: 87 %
Platelets: 84 10*3/uL — ABNORMAL LOW (ref 150–400)
RBC: 3.21 MIL/uL — ABNORMAL LOW (ref 3.87–5.11)
RDW: 17.2 % — AB (ref 11.5–15.5)
WBC: 21 10*3/uL — ABNORMAL HIGH (ref 4.0–10.5)

## 2016-09-09 LAB — COMPREHENSIVE METABOLIC PANEL
ALT: 15 U/L (ref 14–54)
AST: 21 U/L (ref 15–41)
Albumin: 1 g/dL — ABNORMAL LOW (ref 3.5–5.0)
Alkaline Phosphatase: 184 U/L — ABNORMAL HIGH (ref 38–126)
Anion gap: 7 (ref 5–15)
BUN: 28 mg/dL — ABNORMAL HIGH (ref 6–20)
CHLORIDE: 99 mmol/L — AB (ref 101–111)
CO2: 24 mmol/L (ref 22–32)
CREATININE: 1.45 mg/dL — AB (ref 0.44–1.00)
Calcium: 7 mg/dL — ABNORMAL LOW (ref 8.9–10.3)
GFR calc Af Amer: 44 mL/min — ABNORMAL LOW (ref >60)
GFR calc non Af Amer: 38 mL/min — ABNORMAL LOW (ref >60)
GLUCOSE: 161 mg/dL — AB (ref 65–99)
Potassium: 4.9 mmol/L (ref 3.5–5.1)
SODIUM: 130 mmol/L — AB (ref 135–145)
Total Bilirubin: 0.6 mg/dL (ref 0.3–1.2)
Total Protein: 4.7 g/dL — ABNORMAL LOW (ref 6.5–8.1)

## 2016-09-09 LAB — CBG MONITORING, ED
GLUCOSE-CAPILLARY: 63 mg/dL — AB (ref 65–99)
GLUCOSE-CAPILLARY: 86 mg/dL (ref 65–99)

## 2016-09-09 LAB — I-STAT CG4 LACTIC ACID, ED
Lactic Acid, Venous: 3.24 mmol/L (ref 0.5–1.9)
Lactic Acid, Venous: 2.65 mmol/L (ref 0.5–1.9)

## 2016-09-09 LAB — TROPONIN I: Troponin I: <0.03 ng/mL (ref <0.03)

## 2016-09-09 LAB — LIPASE, BLOOD: Lipase: 14 U/L (ref 11–51)

## 2016-09-09 MED ORDER — ONDANSETRON HCL 4 MG PO TABS
4.0000 mg | ORAL_TABLET | Freq: Three times a day (TID) | ORAL | Status: DC | PRN
  Administered 2016-09-09 – 2016-09-25 (×4): 4 mg via ORAL
  Filled 2016-09-09 (×5): qty 1

## 2016-09-09 MED ORDER — INSULIN ASPART 100 UNIT/ML ~~LOC~~ SOLN: 0.0000 [IU] | Freq: Every day | SUBCUTANEOUS | Status: DC

## 2016-09-09 MED ORDER — SODIUM CHLORIDE 0.9 % IV BOLUS (SEPSIS)
250.0000 mL | Freq: Once | INTRAVENOUS | Status: AC
  Administered 2016-09-09: 250 mL via INTRAVENOUS

## 2016-09-09 MED ORDER — IOPAMIDOL (ISOVUE-370) INJECTION 76%
INTRAVENOUS | Status: AC
  Administered 2016-09-09: 80 mL via INTRAVENOUS
  Filled 2016-09-09: qty 100

## 2016-09-09 MED ORDER — VANCOMYCIN HCL IN DEXTROSE 1-5 GM/200ML-% IV SOLN
1000.0000 mg | Freq: Once | INTRAVENOUS | Status: AC
  Administered 2016-09-09: 1000 mg via INTRAVENOUS
  Filled 2016-09-09: qty 200

## 2016-09-09 MED ORDER — GI COCKTAIL ~~LOC~~
30.0000 mL | Freq: Three times a day (TID) | ORAL | Status: DC | PRN
  Administered 2016-09-09: 30 mL via ORAL
  Filled 2016-09-09: qty 30

## 2016-09-09 MED ORDER — SODIUM CHLORIDE 0.9 % IV BOLUS (SEPSIS)
1000.0000 mL | Freq: Once | INTRAVENOUS | Status: AC
  Administered 2016-09-09: 1000 mL via INTRAVENOUS

## 2016-09-09 MED ORDER — SODIUM CHLORIDE 0.9 % IV SOLN
INTRAVENOUS | Status: DC
  Administered 2016-09-10: 11:00:00 via INTRAVENOUS
  Filled 2016-09-09 (×5): qty 1000

## 2016-09-09 MED ORDER — INSULIN ASPART 100 UNIT/ML ~~LOC~~ SOLN: 0.0000 [IU] | Freq: Three times a day (TID) | SUBCUTANEOUS | Status: DC

## 2016-09-09 MED ORDER — MOMETASONE FURO-FORMOTEROL FUM 100-5 MCG/ACT IN AERO
2.0000 | INHALATION_SPRAY | Freq: Two times a day (BID) | RESPIRATORY_TRACT | Status: DC
  Administered 2016-09-10: 2 via RESPIRATORY_TRACT
  Filled 2016-09-09 (×3): qty 8.8

## 2016-09-09 MED ORDER — LEVOFLOXACIN IN D5W 500 MG/100ML IV SOLN
500.0000 mg | INTRAVENOUS | Status: DC
  Administered 2016-09-10: 500 mg via INTRAVENOUS
  Filled 2016-09-09 (×2): qty 100

## 2016-09-09 MED ORDER — LEVOFLOXACIN IN D5W 750 MG/150ML IV SOLN
750.0000 mg | Freq: Once | INTRAVENOUS | Status: AC
  Administered 2016-09-09: 750 mg via INTRAVENOUS
  Filled 2016-09-09: qty 150

## 2016-09-09 MED ORDER — LEVOFLOXACIN IN D5W 750 MG/150ML IV SOLN: 750.0000 mg | Freq: Once | INTRAVENOUS | Status: DC

## 2016-09-09 MED ORDER — VANCOMYCIN HCL IN DEXTROSE 1-5 GM/200ML-% IV SOLN: 1000.0000 mg | INTRAVENOUS | Status: DC

## 2016-09-09 MED ORDER — PANTOPRAZOLE SODIUM 40 MG IV SOLR
40.0000 mg | INTRAVENOUS | Status: DC
  Administered 2016-09-09 – 2016-09-21 (×13): 40 mg via INTRAVENOUS
  Filled 2016-09-09 (×13): qty 40

## 2016-09-09 MED ORDER — METRONIDAZOLE IN NACL 5-0.79 MG/ML-% IV SOLN
500.0000 mg | Freq: Once | INTRAVENOUS | Status: AC
  Administered 2016-09-09: 500 mg via INTRAVENOUS
  Filled 2016-09-09: qty 100

## 2016-09-09 MED ORDER — METRONIDAZOLE IN NACL 5-0.79 MG/ML-% IV SOLN
500.0000 mg | Freq: Four times a day (QID) | INTRAVENOUS | Status: DC
  Administered 2016-09-09 – 2016-09-11 (×6): 500 mg via INTRAVENOUS
  Filled 2016-09-09 (×8): qty 100

## 2016-09-09 MED ORDER — SODIUM CHLORIDE 0.9 % IJ SOLN
INTRAMUSCULAR | Status: AC
Start: 2016-09-09 — End: 2016-09-10
  Administered 2016-09-10: 07:00:00
  Filled 2016-09-09: qty 50

## 2016-09-09 NOTE — ED Notes (Signed)
Hospitalist made aware patient and family are requesting tramadol instead of oxycodone.

## 2016-09-09 NOTE — ED Notes (Signed)
Bed: WA04 Expected date:  Expected time:  Means of arrival:  Comments: 

## 2016-09-09 NOTE — ED Notes (Signed)
Patient requesting this RN call her husband to give him an update. Spoke with patient's husband.

## 2016-09-09 NOTE — ED Notes (Signed)
Pt given apple juice d/t CBG of 63. Will reassess at 2300.

## 2016-09-09 NOTE — ED Notes (Signed)
Upon attempting to obtain labs, this RN noted pitting edema to pt R arm. The R arm appeared more edematous that the left. RN and MD made aware.

## 2016-09-09 NOTE — ED Notes (Signed)
ED Provider at bedside. 

## 2016-09-09 NOTE — ED Notes (Signed)
PA and MD at bedside attempting [place] IV.

## 2016-09-09 NOTE — H&P (Signed)
History and Physical    Haley Graham TGG:[id number] DOB: 02/15/1955 DOA: 09/26/2016  PCP: Glenda Chroman, MD  Patient coming from: Home  I have personally briefly reviewed patient's old medical records in [place]  Chief Complaint: Weakness  HPI: Haley Graham is a 61 y.o. 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 Patient states has had significant generalized weakness over the past 3-7 days to the point which was unable to ambulate with some associated nausea and not feeling well, chills, decreased oral intake. Patient states since his surgery during the last was positioned she's been having loose stool/diarrhea as well as decreased oral intake. Patient denies any fevers, no emesis, no shortness of breath, no abdominal pain, no dysuria, no hematemesis, no melena, no hematochezia, no cough, no dysuria. Patient does endorse some midsternal chest pain across her chest which is nonradiating and intermittent in nature. Patient was seen at the cardiothoracic surgeon's office on the day of admission with his symptoms of generalized significant weakness. Chest x-ray obtained showed aortic stent grafting good position with no effusions or signs of heart failure. It was felt at that time that patient's generalized weakness/failure to thrive was likely secondary to poor oral intake secondary to chronic mesenteric ischemia and vascular disease. It was recommended patient be transferred to [facility] ED for further evaluation. Patient subsequently ended up at [facility] long ED.  ED Course: On presentation to the ED patient  was noted to have systolic blood pressures in the 80s to 90s, tachycardia, leukocytosis with a white count of 21,000, platelet count of 84. Lactic acid level was elevated at 3.24. Comprehensive metabolic profile with a sodium of 1:30 chloride of 99 glucose of 161 BUN of 28 creatinine of 1.45. Urinalysis was not done. Chest x-ray noted stenting aortic arch and descending thoracic aortic regions were stable. Patchy bibasilar atelectasis is noted. CT head done was negative for any acute abnormalities.  CT angiogram chest abdomen and pelvis showed interval placement of stent graft in the transverse aortic arch and proximal descending thoracic aorta for treatment of type B thoracic aortic dissection. Descending thoracic aorta seen to extend into abdominal aorta with stable thrombosis of 2 lm below the left renal artery. 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 Wall enhancement of several loops of small bowel as well as large bowel which may represent colitis and/or enteritis. No pneumatosis seen. Interval development of linear filling defect in the right external iliac artery approximately which may represent thrombus, dissection or combination thereof. Patient started on the sepsis protocol and given a bolus of IV fluids as well as IV Levaquin and IV Flagyl. [facility] hospitalists were called to admit the patient.  Review of Systems: As per HPI otherwise 10 point review of systems negative.   Past Medical History:  Diagnosis Date  . Anxiety   . Arthritis   . Diabetes mellitus without complication ([place])   . Gastroparesis   . GERD (gastroesophageal reflux disease)   . Hypertension   . IBS (irritable bowel syndrome)     Past Surgical History:  Procedure Laterality Date  . ABDOMINAL HYSTERECTOMY    . AORTOGRAM N/A 07/24/2016   Procedure: [facility]  AND ABDOMINAL AORTA  ANGIOGRAM;  Surgeon: Waynetta Sandy, MD;  Location: [place];  Service: Vascular;   Laterality: N/A;  . CESAREAN SECTION    . CHOLECYSTECTOMY    . COLONOSCOPY    . COLONOSCOPY N/A 06/10/2016   Procedure: COLONOSCOPY;  Surgeon: Rogene Houston, MD;  [facility];  Service: Endoscopy;  Laterality: N/A;  8:30  . POLYPECTOMY  06/10/2016   Procedure: POLYPECTOMY;  Surgeon: Rogene Houston, MD;  Location: [facility];  Service: Endoscopy;;  multiple colon  . THORACIC AORTIC ENDOVASCULAR STENT GRAFT N/A 08/06/2016   Procedure: THORACIC AORTIC ENDOVASCULAR STENT GRAFT/Thorasic and Abdominal Angiogram, Entravascular ultrasound., Open femoral exposure,Left Brachial access, and patch angioplasty right femoral artery.;  Surgeon: Serafina Mitchell, MD;  Location: [facility] OR;  Service: Vascular;  Laterality: N/A;  . UPPER GASTROINTESTINAL ENDOSCOPY       reports that she has been smoking Cigarettes.  She has a 17.25 pack-year smoking history. She has never used smokeless tobacco. She reports that she does not drink alcohol or use drugs.  Allergies  Allergen Reactions  . Penicillins Rash and Other (See Comments)    PATIENT HAS HAD A PCN REACTION WITH IMMEDIATE RASH, FACIAL/TONGUE/THROAT SWELLING, SOB, OR LIGHTHEADEDNESS WITH HYPOTENSION:  #  #  #  YES  #  #  #   Has patient had a PCN reaction causing severe rash involving mucus membranes or skin necrosis: no Has patient had a PCN reaction that required hospitalization no Has patient had a PCN reaction occurring within the last 10 years:no If all of the above answers are "NO", then may proceed with Cephalosporin use.  . Ativan [Lorazepam] Other (See Comments)    Makes pt very confused and more agitated, irritable.  . Codeine Nausea And Vomiting  . Reglan [Metoclopramide] Other (See Comments)    TACHYCARDIA    Family History  Problem Relation Age of Onset  . Cancer Mother   . Stroke Mother   . Heart attack Mother   . Stroke Father    Mother deceased age 8 from an acute MI. Father deceased age 31 from acute MIs and  CVA.  Prior to Admission medications   Medication Sig Start Date End Date Taking? Authorizing Provider  ALPRAZolam Duanne Moron) 0.5 MG tablet Take 0.5-1 tablets (0.25-0.5 mg total) by mouth 2 (two) times daily as needed for anxiety. 08/22/16  Yes Gold, Wayne E, PA-C  alum & mag hydroxide-simeth (MAALOX/MYLANTA) 200-200-20 MG/5ML suspension Take 30 mLs by mouth 2 (two) times daily as needed for indigestion or heartburn.   Yes [provider]  aspirin EC 81 MG EC tablet Take 1 tablet (81 mg total) by mouth daily. 08/22/16  Yes Gold, Wayne E, PA-C  citalopram (CELEXA) 40 MG tablet Take 40 mg by mouth at bedtime.    Yes [provider]  clonazePAM (KLONOPIN) 1 MG tablet Take 1 tablet (1 mg total) by mouth at bedtime. Patient taking differently: Take 1 mg by mouth at bedtime as needed (for sleep).  08/22/16  Yes Gold, Wayne E, PA-C  fluticasone (FLONASE) 50 MCG/ACT nasal spray Place 2 sprays into both nostrils daily as needed for allergies or rhinitis.  05/17/16  Yes [provider]  glipiZIDE-metformin (METAGLIP) 5-500 MG tablet Take 0.5 tablets by mouth 2 (two) times daily before a meal. 08/22/16  Yes Gold, Wayne E, PA-C  metoprolol tartrate (LOPRESSOR) 25 MG tablet Take 25 mg by mouth 2 (two) times daily.   Yes [provider]  mometasone-formoterol (DULERA) 200-5 MCG/ACT AERO Inhale 2 puffs into the lungs 2 (two) times daily. 08/22/16  Yes Gold, Wayne E, PA-C  ondansetron (ZOFRAN) 4 MG tablet Take 4 mg by mouth every 8 (eight) hours as needed for nausea or vomiting.   Yes [provider]  pantoprazole (PROTONIX) 40 MG tablet Take 40 mg by mouth daily as needed (acid reflux/ indigestion).  11/12/11  Yes Rehman, Mechele Dawley, MD  Probiotic Product (PROBIOTIC PO) Take 1 tablet by mouth at bedtime. Gut Restore Ultimate (probiotic with zinc and vitamin C)   Yes [provider]  traMADol (ULTRAM) 50 MG tablet Take 1 tablet (50 mg total) by mouth every 6 (six) hours as  needed. Patient taking differently: Take 25 mg by mouth every 6 (six) hours as needed for moderate pain.  09/01/16  Yes Ivin Poot, MD  Metoprolol Tartrate 37.5 MG TABS Take 37.5 mg by mouth 2 (two) times daily. Patient not taking: Reported on 09/19/2016 08/22/16   Jadene Pierini E, PA-C  oxyCODONE 10 MG TABS Take 1 tablet (10 mg total) by mouth 2 (two) times daily as needed for severe pain. Patient not taking: Reported on 09/25/2016 08/22/16   John Giovanni, [place]    Physical Exam: Vitals:   09/02/2016 1424 09/07/2016 1428 09/19/2016 1714 09/03/2016 1800  BP: (!) 100/43  (!) 125/51 (!) 112/50  Pulse: (!) 28  76 76  Resp: 13  16 14   Temp:  (!) 96 F (35.6 C)    TempSrc:  Rectal    SpO2: 91%  100% 100%    Constitutional: Chronically ill-appearing, laying in slight minimal reverse Trendelenburg with extremely dry mucous membranes. Vitals:   09/23/2016 1424 09/11/2016 1428 09/07/2016 1714 09/02/2016 1800  BP: (!) 100/43  (!) 125/51 (!) 112/50  Pulse: (!) 28  76 76  Resp: 13  16 14   Temp:  (!) 96 F (35.6 C)    TempSrc:  Rectal    SpO2: 91%  100% 100%   Eyes: PERRLA, lids and conjunctivae normal ENMT: Mucous membranes are extremely dry. Posterior pharynx clear of any exudate or lesions. Poor dentition.  Neck: normal, supple, no masses, no thyromegaly Respiratory: clear to auscultation bilaterally, no wheezing, no crackles. Normal respiratory effort. No accessory muscle use.  Cardiovascular: Regular rate and rhythm, no murmurs / rubs / gallops. 1-2+ bilateral lower extremity edema. 2+ pedal pulses. No carotid bruits.  Abdomen: Tenderness to palpation in the suprapubic region, right and left lower quadrants. Positive bowel sounds, no masses palpated. No hepatosplenomegaly.  Musculoskeletal: no clubbing / cyanosis. No joint deformity upper and lower extremities. Good ROM, no contractures. Normal muscle tone.  Skin: no rashes, lesions, ulcers. No induration Neurologic: CN 2-12 grossly intact. Sensation  intact, DTR normal. Strength 4-5/5 in all 4.  Psychiatric: Fair judgment and insight. Alert and oriented x 3. Flat affect. Fair mood.   Labs on Admission: I have personally reviewed following labs and imaging studies  CBC:  Recent Labs Lab 10/01/2016 1139  WBC 21.0*  NEUTROABS 18.2*  HGB 10.1*  HCT 30.3*  MCV 94.4  PLT 84*   Basic Metabolic Panel:  Recent Labs Lab 09/17/2016 1139  NA 130*  K 4.9  CL 99*  CO2 24  GLUCOSE 161*  BUN 28*  CREATININE 1.45*  CALCIUM 7.0*   GFR: Estimated Creatinine Clearance: 39.3 mL/min (A) (by C-G formula based on SCr of 1.45 mg/dL (H)). Liver Function Tests:  Recent Labs Lab 09/14/2016 1139  AST  21  ALT 15  ALKPHOS 184*  BILITOT 0.6  PROT 4.7*  ALBUMIN 1.0*    Recent Labs Lab 09/18/2016 1139  LIPASE 14   No results for input(s): AMMONIA in the last 168 hours. Coagulation Profile: No results for input(s): INR, PROTIME in the last 168 hours. Cardiac Enzymes:  Recent Labs Lab 09/12/2016 1139  TROPONINI <0.03   BNP (last 3 results) No results for input(s): PROBNP in the last 8760 hours. HbA1C: No results for input(s): HGBA1C in the last 72 hours. CBG: No results for input(s): GLUCAP in the last 168 hours. Lipid Profile: No results for input(s): CHOL, HDL, LDLCALC, TRIG, CHOLHDL, LDLDIRECT in the last 72 hours. Thyroid Function Tests: No results for input(s): TSH, T4TOTAL, FREET4, T3FREE, THYROIDAB in the last 72 hours. Anemia Panel: No results for input(s): VITAMINB12, FOLATE, FERRITIN, TIBC, IRON, RETICCTPCT in the last 72 hours. Urine analysis:    Component Value Date/Time   COLORURINE STRAW (A) 08/08/2016 1220   APPEARANCEUR CLEAR 08/08/2016 1220   LABSPEC 1.006 08/08/2016 1220   PHURINE 7.0 08/08/2016 1220   GLUCOSEU NEGATIVE 08/08/2016 1220   HGBUR NEGATIVE 08/08/2016 1220   BILIRUBINUR NEGATIVE 08/08/2016 1220   KETONESUR NEGATIVE 08/08/2016 1220   PROTEINUR NEGATIVE 08/08/2016 1220   UROBILINOGEN 0.2  12/19/2011 0820   NITRITE NEGATIVE 08/08/2016 1220   LEUKOCYTESUR NEGATIVE 08/08/2016 1220    Radiological Exams on Admission: Dg Chest 2 View  Result Date: 10/02/2016 CLINICAL DATA:  History of aortic dissection with stent in place EXAM: CHEST  2 VIEW COMPARISON:  August 15, 2016 FINDINGS: There is a stent extending from the aortic arch into the mid descending thoracic aorta, stable. There is patchy bibasilar atelectatic change. Lungs elsewhere clear. Heart size and pulmonary vascularity are normal. The aortic contour appears stable. No adenopathy. No bone lesions. IMPRESSION: Stent in aortic arch and descending thoracic aortic regions, stable. Aortic contour appears stable compared to recent study by radiography. Heart size normal. Patchy bibasilar atelectasis noted. Lungs elsewhere clear. Electronically Signed   By: Lowella Grip III M.D.   On: 09/28/2016 09:35   Ct Head Wo Contrast  Result Date: 09/18/2016 CLINICAL DATA:  Altered level of consciousness EXAM: CT HEAD WITHOUT CONTRAST TECHNIQUE: Contiguous axial images were obtained from the base of the skull through the vertex without intravenous contrast. COMPARISON:  12/31/2004 FINDINGS: Brain: Mild low-density in the periventricular white matter is compatible with chronic ischemic changes of small vessel disease. There is no mass effect, midline shift, or acute hemorrhage. Vascular: No hyperdense vessel or unexpected calcification. Skull: Intact. Sinuses/Orbits: Small amount of fluid in the mastoid air cells. Visualized paranasal sinuses are clear. Orbits are unremarkable. Other: Noncontributory. IMPRESSION: No acute intracranial pathology.  Chronic ischemic changes. Electronically Signed   By: Marybelle Killings M.D.   On: 09/22/2016 14:32   Ct Angio Chest/abd/pel For Dissection W And/or Wo Contrast  Result Date: 09/08/2016 CLINICAL DATA:  Thoracic aortic dissection. EXAM: CT ANGIOGRAPHY CHEST, ABDOMEN AND PELVIS TECHNIQUE: Multidetector CT imaging  through the chest, abdomen and pelvis was performed using the standard protocol during bolus administration of intravenous contrast. Multiplanar reconstructed images and MIPs were obtained and reviewed to evaluate the vascular anatomy. CONTRAST:  80 mL of Isovue 370 intravenously. COMPARISON:  CT scan August 05, 2016. FINDINGS: CTA CHEST FINDINGS Cardiovascular: Interval placement of stent graft for treatment of type B thoracic aortic dissection. The stent extends proximally from the origin of the right innominate artery into the proximal descending thoracic aorta. Normal  contrast filling of the great vessels is noted, with continued presence of moderate stenosis involving the origin of the left subclavian artery. Dissection is seen to extend throughout the descending thoracic aorta into the abdominal aorta. No pericardial effusion is noted. Normal cardiac size. Mediastinum/Nodes: No enlarged mediastinal, hilar, or axillary lymph nodes. Thyroid gland, trachea, and esophagus demonstrate no significant findings. Lungs/Pleura: No pneumothorax or pleural effusion is noted. Mild bilateral posterior basilar subsegmental atelectasis is noted. Musculoskeletal: No chest wall abnormality. No acute or significant osseous findings. Review of the MIP images confirms the above findings. CTA ABDOMEN AND PELVIS FINDINGS VASCULAR Aorta: Dissection from thoracic aorta is noted to extend into abdominal aorta, although once again the true lumen is noted to be thrombosed starting at the level of the left renal artery. Dissection flap extends to aortic bifurcation and into right common iliac artery. Celiac: Patent without evidence of aneurysm, dissection, vasculitis or significant stenosis. SMA: Patent without evidence of aneurysm, dissection, vasculitis or significant stenosis. Renals: Right renal artery arises from false lumen and appears to be widely patent without stenosis. Left renal artery origin is seen overlying dissection flap and  is thrombosed as noted on prior exam. IMA: Patent without evidence of aneurysm, dissection, vasculitis or significant stenosis. Inflow: As noted previously, thrombosed dissection flap is seen extending into right common iliac artery. There is interval development of linear filling defect extending throughout proximal portion of right external iliac artery with associated calcifications which may represent further extension of dissection, thrombosis or a combination thereof. Veins: No obvious venous abnormality within the limitations of this arterial phase study. Review of the MIP images confirms the above findings. NON-VASCULAR Hepatobiliary: Status post cholecystectomy. Fatty infiltration of the liver. Pancreas: Unremarkable. No pancreatic ductal dilatation or surrounding inflammatory changes. Spleen: Normal in size without focal abnormality. Adrenals/Urinary Tract: Stable left adrenal nodule. Right adrenal gland appears normal. Progressive atrophy of left kidney is noted consistent with previously described thrombosed left renal artery. Right kidney and ureter appear normal. Urinary bladder is unremarkable. Stomach/Bowel: The stomach is unremarkable. The appendix appears normal. There is no evidence of bowel obstruction. There is noted wall enhancement of several loops of small bowel and the colon which may represent inflammation. Lymphatic: No significant adenopathy is noted. Reproductive: Status post hysterectomy. No adnexal masses. Other: No abdominal wall hernia or abnormality. No abdominopelvic ascites. Fluid collection is seen in the right inguinal region consistent with recent stent graft placement. Musculoskeletal: No acute or significant osseous findings. Review of the MIP images confirms the above findings. IMPRESSION: Interval placement of stent graft in transverse aortic arch and proximal descending thoracic aorta for treatment of type B thoracic aortic dissection. Descending thoracic aorta is seen to  extend into the abdominal aorta, with stable thrombosis of the true lumen below the left renal artery. Continued thrombosis of left renal artery is noted with progressive atrophy of the left kidney as a result. Fatty infiltration of the liver is noted. There is noted wall enhancement of several loops of small bowel as well as the large bowel which may represent colitis and/or enteritis. No pneumatosis is seen at this time. There is noted interval development of linear filling defect in the right external iliac artery proximally which may represent thrombus, dissection or combination thereof. Potentially this may be related to stent graft placement. Critical Value/emergent results were called by telephone at the time of interpretation on 09/23/2016 at 2:59 pm to Dr. Darl Householder, who verbally acknowledged these results. Electronically Signed   By:  Marijo Conception, M.D.   On: 09/08/2016 15:01    EKG: Independently reviewed. Nonspecific T-wave changes. No ischemic changes noted.  Assessment/Plan Principal Problem:   Sepsis ([place]) Active Problems:   Hypotension   Abnormal computed tomography angiography (CTA) of abdomen and pelvis   GERD (gastroesophageal reflux disease)   Dissection of thoracoabdominal aorta (HCC)   AAA (abdominal aortic aneurysm) (HCC)   Benign essential HTN   Diabetes mellitus type 2 in obese (HCC)   Anxiety state   Hyponatremia   Leukocytosis   AKI (acute kidney injury) ([place])   Dehydration   Weakness   Anemia   Thrombocytopenia (HCC)   Colitis: Probable   #1 sepsis Patient meeting criteria for sepsis as it presented with hypotension, tachycardia, elevated lactic acid level, leukocytosis. CT angiogram chest abdomen and pelvis with some wall enhancement of several loops of small bowel as well as large bowel concerning for possible colitis or enteritis. Patient did state as had multiple loose stools during her last hospitalization and post discharge. Patient also with lower abdominal  pain on examination. Check blood cultures 2. Check a UA with cultures and sensitivities. Check a GI pathogen PCR panel, check a C. difficile PCR. Hydrated with IV fluids. Placed empirically on IV Levaquin, IV Flagyl, IV vancomycin while cultures are pending. Hold antihypertensive medications for now. Follow.  #2 hypotension Questionable etiology. Differential includes infectious etiology versus concern for leaking of recently repaired dissection versus cardiac etiology although first set of troponin was negative versus hemorrhagic although patient with no overt GI bleed. Patient blood pressure with some response to IV fluids. Panculture. Cycle cardiac enzymes. CT angiogram chest abdomen and pelvis with some abnormalities noted with concern for thrombosis of left renal artery and stable thrombosis seen in descending thoracic aorta. Placed empirically on IV Levaquin, IV Flagyl, IV vancomycin while cultures are pending. Vascular surgery has been consulted and was says the patient once patient [facility] at [facility].  #3 dehydration IV fluids.  #4 anemia Likely secondary to recent dissection. Check an anemia panel. Follow H&H as patient's hemoglobin may drop with hydration. Transfuse for hemoglobin less than 7.  #5 COPD Patient states he quit smoking cigarettes since last hospitalization. Place on the liver and Spiriva. Nebs as needed.  #6 acute kidney injury Likely secondary to prerenal azotemia in the setting of dehydration/hypotension, poor oral intake, Ultram use.check a urinary sodium check a urine creatinine. Place on IV fluids. Renal ultrasound. If no significant improvement with renal function may need to consult with nephrology. Will monitor for now.  #7 abnormal CT angiogram chest abdomen and pelvis CT angiogram chest abdomen and pelvis concerning for thrombosis of left renal artery which might be progressive. Patient with significant lower abdominal pain. Patient with generalized  weakness. CT angiogram abdomen and pelvis also concerning for possible colitis. Check a stool for GI pathogen PCR panel. Check stool for C. difficile PCR. Placed empirically on IV antibiotics. Case has been discussed with vascular surgery who have formally been consulted for further evaluation and management. Keep patient on clear liquids for now.  #8 thrombocytopenia Patient with no overt bleeding. Patient with thrombocytopenia since last hospitalization where workup which was done was negative. Monitor closely. Place on SCDs.   #9 gastroesophageal reflux disease PPI.  #10 anxiety  Continue home regimen Celexa, Xanax and Klonopin.  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 Due to hypotension will hold beta blocker for now. Follow H&H. Vascular surgery has been consulted.  #12 hypertension Antihypertensive medications on hold secondary to hypotension.  #13 probable colitis GI pathogen PCR panel. C. difficile PCR. IV fluids. Supportive care. Placed empirically on IV Flagyl and IV Levaquin.   DVT prophylaxis: SCDs Code Status: Full Family Communication: Updated patient. No family at bedside. Disposition Plan: Likely skilled nursing facility pending acute hospitalization. Consults called: Vascular surgery: Dr. Oneida Alar Admission status: Admit to stepdown unit at [place] [phone]  If 7PM-7AM, please contact night-coverage [email or web address] Password [facility]  09/10/2016, 6:35 PM

## 2016-09-09 NOTE — ED Notes (Signed)
Patient transported to CT 

## 2016-09-09 NOTE — ED Provider Notes (Signed)
[facility] DEPT Provider Note   CSN: [id number] Arrival date & time: 09/11/2016  1105     History   Chief Complaint Chief Complaint  Patient presents with  . Weakness    HPI Haley Graham is a 61 y.o. female.  HPI   61 year old female with history of aortic dissection s/p surgical repair, diabetes, anxiety, dysphagia, gastroparesis and irritable bowel syndrome brought here via EMS from doctors office for evaluation of generalized weakness. Patient was admitted to the hospital on 05/19 2018 for chest pain in which she was found to have a type B aortic dissection. She was hospitalized for nearly 2 months. She did had a dissection repair approximately 3 1/2 weeks ago. She was discharged home for about 2 weeks. She was doing well however for the past 5 days she reported having generalized weakness. States that she is mostly in bed, when she gets up, patient feels pretty weak and tired and having to sit down. She collapsed several times without passing out. She went to be seen by her private care doctor office today and was sent here for further evaluation. Aside from occasional abdominal discomfort, patient denies having fever, chills, headache, chest pain, shortness of breath, pleuritic chest pain, dysuria, focal numbness. She hasn't been eating and drinking much due to not having much of an appetite. Husband felt that the symptoms likely due to pt being on many different medications since her hospitalization. Patient does report feeling dehydrated.  Past Medical History:  Diagnosis Date  . Anxiety   . Arthritis   . Diabetes mellitus without complication ([place])   . Gastroparesis   . GERD (gastroesophageal reflux disease)   . Hypertension   . IBS (irritable bowel syndrome)     Patient Active Problem List   Diagnosis Date Noted  . AAA (abdominal aortic aneurysm) ([place])   . Abdominal pain   . Ischemic enteritis ([place])   . Benign essential HTN   . Diabetes mellitus type 2 in obese  ([place])   . Anxiety state   . Tachycardia   . Tachypnea   . Hyponatremia   . Leukocytosis   . Acute blood loss anemia   . Dissection of thoracoabdominal aorta ([place])   . Unstable angina pectoris ([place])   . SOB (shortness of breath)   . Aortic dissection distal to left subclavian ([place]) 06/20/2016  . Change in stool 05/28/2016  . Gastroparesis 11/19/2011  . Nausea 11/19/2011  . Dysphagia 11/11/2011  . GERD (gastroesophageal reflux disease) 11/11/2011    Past Surgical History:  Procedure Laterality Date  . ABDOMINAL HYSTERECTOMY    . AORTOGRAM N/A 07/24/2016   Procedure: [facility] AND ABDOMINAL AORTA  ANGIOGRAM;  Surgeon: Waynetta Sandy, MD;  Location: [facility];  Service: Vascular;  Laterality: N/A;  . CESAREAN SECTION    . CHOLECYSTECTOMY    . COLONOSCOPY    . COLONOSCOPY N/A 06/10/2016   Procedure: COLONOSCOPY;  Surgeon: Rogene Houston, MD;  [facility];  Service: Endoscopy;  Laterality: N/A;  8:30  . POLYPECTOMY  06/10/2016   Procedure: POLYPECTOMY;  Surgeon: Rogene Houston, MD;  [facility];  Service: Endoscopy;;  multiple colon  . THORACIC AORTIC ENDOVASCULAR STENT GRAFT N/A 08/06/2016   Procedure: THORACIC AORTIC ENDOVASCULAR STENT GRAFT/Thorasic and Abdominal Angiogram, Entravascular ultrasound., Open femoral exposure,Left Brachial access, and patch angioplasty right femoral artery.;  Surgeon: Serafina Mitchell, MD;  [facility];  Service: Vascular;  Laterality: N/A;  . UPPER GASTROINTESTINAL ENDOSCOPY  OB History    No data available       Home Medications    Prior to Admission medications   Medication Sig Start Date End Date Taking? Authorizing Provider  ALPRAZolam Duanne Moron) 0.5 MG tablet Take 0.5-1 tablets (0.25-0.5 mg total) by mouth 2 (two) times daily as needed for anxiety. 08/22/16   John Giovanni, PA-C  aspirin EC 81 MG EC tablet Take 1 tablet (81 mg total) by mouth daily. 08/22/16   Gold, Wilder Glade, PA-C  citalopram (CELEXA) 40 MG  tablet Take 40 mg by mouth at bedtime.     [provider]  clonazePAM (KLONOPIN) 1 MG tablet Take 1 tablet (1 mg total) by mouth at bedtime. 08/22/16   Gold, Wayne E, PA-C  fluticasone (FLONASE) 50 MCG/ACT nasal spray Place 2 sprays into both nostrils daily as needed for allergies or rhinitis.  05/17/16   [provider]  glipiZIDE-metformin (METAGLIP) 5-500 MG tablet Take 0.5 tablets by mouth 2 (two) times daily before a meal. 08/22/16   Gold, Wilder Glade, PA-C  Metoprolol Tartrate 37.5 MG TABS Take 37.5 mg by mouth 2 (two) times daily. 08/22/16   Gold, Wayne E, PA-C  mometasone-formoterol (DULERA) 200-5 MCG/ACT AERO Inhale 2 puffs into the lungs 2 (two) times daily. 08/22/16   Gold, Wayne E, PA-C  oxyCODONE 10 MG TABS Take 1 tablet (10 mg total) by mouth 2 (two) times daily as needed for severe pain. 08/22/16   Gold, Wayne E, PA-C  pantoprazole (PROTONIX) 40 MG tablet Take 40 mg by mouth daily as needed (acid reflux/ indigestion).  11/12/11   Rogene Houston, MD  Probiotic Product (PROBIOTIC PO) Take 1 tablet by mouth at bedtime. Gut Restore Ultimate (probiotic with zinc and vitamin C)    [provider]  traMADol (ULTRAM) 50 MG tablet Take 1 tablet (50 mg total) by mouth every 6 (six) hours as needed. 09/01/16   Ivin Poot, MD    Family History Family History  Problem Relation Age of Onset  . Cancer Mother   . Stroke Mother   . Heart attack Mother   . Stroke Father     Social History Social History  Substance Use Topics  . Smoking status: Current Every Day Smoker    Packs/day: 0.75    Years: 23.00    Types: Cigarettes  . Smokeless tobacco: Never Used     Comment: 5-6 cigarettes. Cut back about 3 weeks ago. Smoking since age 80.  Marland Kitchen Alcohol use No     Allergies   Penicillins; Ativan [lorazepam]; Codeine; and Reglan [metoclopramide]   Review of Systems Review of Systems  All other systems reviewed and are negative.    Physical Exam Updated Vital  Signs BP (!) 102/43 (BP Location: Left Arm)   Pulse 81   Temp 97.7 F (36.5 C) (Oral)   Resp 15   SpO2 99%   Physical Exam  Constitutional: She is oriented to person, place, and time. She appears well-developed and well-nourished. No distress.  Ill appearing female laying in bed  HENT:  Head: Atraumatic.  Mouth very dry  Eyes: Pupils are equal, round, and reactive to light. Conjunctivae and EOM are normal.  Neck: Normal range of motion. Neck supple.  No nuchal rigidity  Cardiovascular: Normal rate, regular rhythm and intact distal pulses.   Pulmonary/Chest:  Decreased breath sounds without wheeze, rales or rhonchi  Abdominal: Soft. Bowel sounds are normal. She exhibits no distension. There is tenderness (mild generalized tenderness  without guarding or rebound.  no pulsatile mass).  Musculoskeletal: She exhibits edema (1+ pitting edema to BLE ).  4/5 strength to all 4 extremities with intact distal pulses  Neurological: She is alert and oriented to person, place, and time. No cranial nerve deficit or sensory deficit. GCS eye subscore is 4. GCS verbal subscore is 5. GCS motor subscore is 6.  Skin: No rash noted.  Psychiatric: She has a normal mood and affect.  Nursing note and vitals reviewed.    ED Treatments / Results  Labs (all labs ordered are listed, but only abnormal results are displayed) Labs Reviewed - No data to display  EKG  EKG Interpretation None      Date: 09/08/2016  Rate: 81  Rhythm: normal sinus rhythm  QRS Axis: normal  Intervals: normal  ST/T Wave abnormalities: normal  Conduction Disutrbances: none  Narrative Interpretation: Nonspecific T abnormalities, lateral leads  Old EKG Reviewed: No significant changes noted     Radiology Dg Chest 2 View  Result Date: 09/08/2016 CLINICAL DATA:  History of aortic dissection with stent in place EXAM: CHEST  2 VIEW COMPARISON:  August 15, 2016 FINDINGS: There is a stent extending from the aortic arch into the  mid descending thoracic aorta, stable. There is patchy bibasilar atelectatic change. Lungs elsewhere clear. Heart size and pulmonary vascularity are normal. The aortic contour appears stable. No adenopathy. No bone lesions. IMPRESSION: Stent in aortic arch and descending thoracic aortic regions, stable. Aortic contour appears stable compared to recent study by radiography. Heart size normal. Patchy bibasilar atelectasis noted. Lungs elsewhere clear. Electronically Signed   By: Lowella Grip III M.D.   On: 09/25/2016 09:35    Procedures Procedures (including critical care time)  Medications Ordered in ED Medications - No data to display   Initial Impression / Assessment and Plan / ED Course  I have reviewed the triage vital signs and the nursing notes.  Pertinent labs & imaging results that were available during my care of the patient were reviewed by me and considered in my medical decision making (see chart for details).    BP (!) 141/58   Pulse 87   Temp (!) 96 F (35.6 C) (Rectal)   Resp 17   SpO2 98%    Final Clinical Impressions(s) / ED Diagnoses   Final diagnoses:  Generalized weakness  Sepsis, due to unspecified organism [facility])  Generalized abdominal pain    New Prescriptions New Prescriptions   No medications on file   12:22 PM This is a patient who recently had a AAA surgery here with generalized weakness and near syncope. Although she is at increased risk of developing PE due to prior surgery, she does not complaining of pleuritic chest pain or shortness of breath. She is ill-appearing, dehydrated, and having global weakness. No focal weakness.  Does have abdominal pain, may benefit from repeat chest/abd/pelvis CTA if indicated.  IVF and pain medication given.  Work up initiated.   12:32 PM Positive orthostatic vital sign.  Plan to obtain emergent chest/abd/pelvis CT angiogram to r/o endoleak from recent surgical repair.  Care discussed with DR. Darl Householder.   1:28  PM Patient remains hypotensive despite IV fluid. She endorse feeling drowsy and sleepy. She still able to move all 4 extremities. Will initiate code sepsis, obtain 2 IV access, aggressive fluid resuscitation.  Will monitor closely.    2:16 PM Due to potential sepsis (hypotension, tachycardia, leukocystosis), Code sepsis initiated, suspect intraabdominal source, therefore abx started along with  IVF at 78ml/kg.   Pt sign out to oncoming provider who will call for admission.  I have also consulted vascular surgeon Dr. Oneida Alar who request pt to be admitted to medicine and transfer to [facility] for further management.   CRITICAL CARE Performed by: Domenic Moras Total critical care time: 45 minutes Critical care time was exclusive of separately billable procedures and treating other patients. Critical care was necessary to treat or prevent imminent or life-threatening deterioration. Critical care was time spent personally by me on the following activities: development of treatment plan with patient and/or surrogate as well as nursing, discussions with consultants, evaluation of patient's response to treatment, examination of patient, obtaining history from patient or surrogate, ordering and performing treatments and interventions, ordering and review of laboratory studies, ordering and review of radiographic studies, pulse oximetry and re-evaluation of patient's condition.    Domenic Moras, PA-C 09/10/16 0606    Drenda Freeze, MD 09/11/16 [phone]

## 2016-09-09 NOTE — ED Notes (Signed)
Patient given ice chips. 

## 2016-09-09 NOTE — ED Triage Notes (Signed)
Per EMS, pt from MD office.  Pt had triple A stent a few weeks ago.  Pt has felt poor.  Not wanting to eat drink.  No n/v/fever.  Vitals: 107/61, hr 84, resp 18, 96% ra, cbg 162

## 2016-09-09 NOTE — ED Notes (Signed)
Hospitalist made aware of concern of holding patient in ED for stepdown bed. MD also made aware of pitting edema noted to patient's right arm.

## 2016-09-09 NOTE — ED Notes (Signed)
RN at bedside to draw lab

## 2016-09-09 NOTE — ED Notes (Signed)
Main lab at bedside to attempt to obtain labs.

## 2016-09-09 NOTE — Progress Notes (Signed)
PCP is Glenda Chroman, MD Referring Provider is Glenda Chroman, MD  Chief Complaint  Patient presents with  . Routine Post Op    2 wee f/u after hosptial discharge, Surgery with Dr Trula Slade 08/06/2016    HPI: Patient presents for scheduled visit after placement of a descending thoracic aortic stent graft by [facility] for [place] -type B aortic dissection She has  been at home for approximately 3 weeks. During her prolonged hospitalization she had evidence of chronic abdominal pain associated with food intake [and problems with adequate oral nutrition ]probably due to mesenteric angina and mesenteric vascular disease for which there is no surgical option according to vascular surgery.  Initially she was able to walk with assistance in her yard but now she is unable to stand without falling. Trying to get her from wheelchair to a standing scale in the office resulted in her falling to her seat on the floor. Her oral intake on a very bland diet has been difficult to maintain. She has persistent intermittent abdominal pain and nausea. She is been taking Phenergan which has resulted in her being very sleepy and fatigue. She has generalized edema probably from poor nutritional status.  Her chest x-ray taken today is clear with the aortic stent graft in good position, no effusions or signs of heart failure.  Vital signs and oxygen saturation are normal on room air.  She is at very high risk for fall with serious injury as her husband is required to work during the day. She has been treated with home health  physical therapy and home health nursing but still has shown progressive deterioration to her current status which is unsafe for her to be alone.  Past Medical History:  Diagnosis Date  . Anxiety   . Arthritis   . Diabetes mellitus without complication ([place])   . Gastroparesis   . GERD (gastroesophageal reflux disease)   . Hypertension   . IBS (irritable bowel syndrome)     Past Surgical History:   Procedure Laterality Date  . ABDOMINAL HYSTERECTOMY    . AORTOGRAM N/A 07/24/2016   Procedure: [facility] AND ABDOMINAL AORTA  ANGIOGRAM;  Surgeon: Waynetta Sandy, MD;  Location: [facility];  Service: Vascular;  Laterality: N/A;  . CESAREAN SECTION    . CHOLECYSTECTOMY    . COLONOSCOPY    . COLONOSCOPY N/A 06/10/2016   Procedure: COLONOSCOPY;  Surgeon: Rogene Houston, MD;  [facility];  Service: Endoscopy;  Laterality: N/A;  8:30  . POLYPECTOMY  06/10/2016   Procedure: POLYPECTOMY;  Surgeon: Rogene Houston, MD;  [facility];  Service: Endoscopy;;  multiple colon  . THORACIC AORTIC ENDOVASCULAR STENT GRAFT N/A 08/06/2016   Procedure: THORACIC AORTIC ENDOVASCULAR STENT GRAFT/Thorasic and Abdominal Angiogram, Entravascular ultrasound., Open femoral exposure,Left Brachial access, and patch angioplasty right femoral artery.;  Surgeon: Serafina Mitchell, MD;  [facility];  Service: Vascular;  Laterality: N/A;  . UPPER GASTROINTESTINAL ENDOSCOPY      Family History  Problem Relation Age of Onset  . Cancer Mother   . Stroke Mother   . Heart attack Mother   . Stroke Father     Social History Social History  Substance Use Topics  . Smoking status: Current Every Day Smoker    Packs/day: 0.75    Years: 23.00    Types: Cigarettes  . Smokeless tobacco: Never Used     Comment: 5-6 cigarettes. Cut back about 3 weeks ago. Smoking since age 37.  Marland Kitchen  Alcohol use No    Current Outpatient Prescriptions  Medication Sig Dispense Refill  . ALPRAZolam (XANAX) 0.5 MG tablet Take 0.5-1 tablets (0.25-0.5 mg total) by mouth 2 (two) times daily as needed for anxiety. 30 tablet 0  . aspirin EC 81 MG EC tablet Take 1 tablet (81 mg total) by mouth daily.    . citalopram (CELEXA) 40 MG tablet Take 40 mg by mouth at bedtime.     . clonazePAM (KLONOPIN) 1 MG tablet Take 1 tablet (1 mg total) by mouth at bedtime. 30 tablet 0  . fluticasone (FLONASE) 50 MCG/ACT nasal spray Place 2 sprays  into both nostrils daily as needed for allergies or rhinitis.   11  . glipiZIDE-metformin (METAGLIP) 5-500 MG tablet Take 0.5 tablets by mouth 2 (two) times daily before a meal. 60 tablet 1  . Metoprolol Tartrate 37.5 MG TABS Take 37.5 mg by mouth 2 (two) times daily. 60 tablet 1  . mometasone-formoterol (DULERA) 200-5 MCG/ACT AERO Inhale 2 puffs into the lungs 2 (two) times daily. 1 Inhaler 1  . oxyCODONE 10 MG TABS Take 1 tablet (10 mg total) by mouth 2 (two) times daily as needed for severe pain. 30 tablet 0  . pantoprazole (PROTONIX) 40 MG tablet Take 40 mg by mouth daily as needed (acid reflux/ indigestion).     . Probiotic Product (PROBIOTIC PO) Take 1 tablet by mouth at bedtime. Gut Restore Ultimate (probiotic with zinc and vitamin C)    . traMADol (ULTRAM) 50 MG tablet Take 1 tablet (50 mg total) by mouth every 6 (six) hours as needed. 30 tablet 0   No current facility-administered medications for this visit.     Allergies  Allergen Reactions  . Penicillins Rash and Other (See Comments)    PATIENT HAS HAD A PCN REACTION WITH IMMEDIATE RASH, FACIAL/TONGUE/THROAT SWELLING, SOB, OR LIGHTHEADEDNESS WITH HYPOTENSION:  #  #  #  YES  #  #  #   Has patient had a PCN reaction causing severe rash involving mucus membranes or skin necrosis: no Has patient had a PCN reaction that required hospitalization no Has patient had a PCN reaction occurring within the last 10 years:no If all of the above answers are "NO", then may proceed with Cephalosporin use.  . Ativan [Lorazepam] Other (See Comments)    Makes pt very confused and more agitated, irritable.  . Codeine Nausea And Vomiting  . Reglan [Metoclopramide] Other (See Comments)    TACHYCARDIA    Review of Systems   No fever No blood in her stool 1 fall in her yard on the grafts Intermittent nausea with rare emesis  BP (!) 109/59   Pulse 94   Resp 20   Ht 5\' 3"  (1.6 m)   Wt 159 lb (72.1 kg)   SpO2 99% Comment: RA  BMI 28.17 kg/m   Physical Exam      Exam    General- alert , fragile and weak-appearing and chronically ill-appearing   Lungs- clear without rales, wheezes   Cor- regular rate and rhythm, no murmur , gallop   Abdomen- soft, no guarding or abnormal bowel sounds   Extremities - warm, non-tender, generalized 2+ non-pitting edema   Neuro- oriented, appropriate, no focal weakness  Diagnostic Tests: Chest x-ray clear  Impression: Failure to thrive with poor oral intake secondary to chronic mesenteric ischemia and vascular disease. This appears to be a chronic but clearly could limit her survival. At this point she is not safe to be  at home and needs to be hospitalized for placement in a skilled nursing facility for her safety.  Plan: [facility]. Situation discussed with triage RN.   Len Childs, MD [facility] [phone]

## 2016-09-09 NOTE — ED Notes (Signed)
Unsuccessful IV attempt x2 for second IV. Charge RN asked to attempt.

## 2016-09-09 NOTE — Progress Notes (Signed)
A consult was received from an ED physician for levofloxacin/metronidazole per pharmacy dosing.    The patient's profile has been reviewed for ht/wt/allergies/indication/available labs.   A one time order has been placed for above medications.  Further antibiotics/pharmacy consults should be ordered by admitting physician if indicated.                       Thank you,   Royetta Asal, PharmD, BCPS Pager [phone] 09/26/2016 2:17 PM

## 2016-09-09 NOTE — ED Notes (Signed)
PA made aware of trending blood pressures.

## 2016-09-09 NOTE — ED Notes (Signed)
See paper chart for downtime activity.

## 2016-09-09 NOTE — Progress Notes (Signed)
Pharmacy Antibiotic Note  Haley Graham is a 61 y.o. female admitted on 09/23/2016 with sepsis probable colitis, and AKI.  Pharmacy has been consulted for Levaquin, Flagyl, and vancomycin dosing.  Patient received Levaquin 750 mg IV x 1 and Flagyl 500 mg IV x 1 in ED earlier today.  Has history of significant penicilin allergy.  Plan: Vancomycin 1 gram IV q24h, first dose now Flagyl 500 mg IV q6h, next dose 2200 Levaquin 500 mg IV q24h, next dose tomorrow at 1400 Follow renal function Follow culture results Consider de-escalation of broad-spectrum therapy when clinically appropriate      Temp (24hrs), Avg:96.9 F (36.1 C), Min:96 F (35.6 C), Max:97.7 F (36.5 C)   Recent Labs Lab 09/21/2016 1139 09/28/2016 1449 09/28/2016 2034  WBC 21.0*  --   --   CREATININE 1.45*  --   --   LATICACIDVEN  --  3.24* 2.65*    Estimated Creatinine Clearance: 39.3 mL/min (A) (by C-G formula based on SCr of 1.45 mg/dL (H)).    Allergies  Allergen Reactions  . Penicillins Rash and Other (See Comments)    PATIENT HAS HAD A PCN REACTION WITH IMMEDIATE RASH, FACIAL/TONGUE/THROAT SWELLING, SOB, OR LIGHTHEADEDNESS WITH HYPOTENSION:  #  #  #  YES  #  #  #   Has patient had a PCN reaction causing severe rash involving mucus membranes or skin necrosis: no Has patient had a PCN reaction that required hospitalization no Has patient had a PCN reaction occurring within the last 10 years:no If all of the above answers are "NO", then may proceed with Cephalosporin use.  . Ativan [Lorazepam] Other (See Comments)    Makes pt very confused and more agitated, irritable.  . Codeine Nausea And Vomiting  . Reglan [Metoclopramide] Other (See Comments)    TACHYCARDIA    Antimicrobials this admission: 8/8 Flagyl >>  8/8 Levaquin >> 8/8 Vancomycin >>  Dose adjustments this admission: n/a  Microbiology results: 8/8 blood cx: collected   Thank you for allowing pharmacy to be a part of this patient's  care.  Clayburn Pert, PharmD, BCPS Pager: [phone] 09/17/2016  9:32 PM

## 2016-09-09 NOTE — ED Notes (Signed)
Hospitalist at bedside 

## 2016-09-09 NOTE — ED Notes (Signed)
Pt on bed pan trying for UA

## 2016-09-09 NOTE — Progress Notes (Signed)
I was consulted by the [facility] ER and Dr Grandville Silos regarding eval of pt dissection as etiology for her current decline in status.  I reviewed her CTA which shows stent in good position.  SMA and celiac do opacify and overall scan is similar to her last hospital stay.  No evidence of bleeding from dissection.  She does have a filling defect in the right external iliac artery but per report is asymptomatic.  I spoke with the ER and recommended transfer of pt to [facility] where we could evaluate her.  This was discussed with the ER at 3pm today.  I also spoke with Dr Grandville Silos and have agreed to see pt when she arrives at [facility].  This was at approximately 8 pm.  Ruta Hinds, MD [facility] Office: [phone] Pager: [phone]

## 2016-09-10 ENCOUNTER — Inpatient Hospital Stay (HOSPITAL_COMMUNITY): Payer: BLUE CROSS/BLUE SHIELD

## 2016-09-10 DIAGNOSIS — I7103 Dissection of thoracoabdominal aorta: Secondary | ICD-10-CM

## 2016-09-10 DIAGNOSIS — D72829 Elevated white blood cell count, unspecified: Secondary | ICD-10-CM

## 2016-09-10 LAB — GASTROINTESTINAL PANEL BY PCR, STOOL (REPLACES STOOL CULTURE)
ADENOVIRUS F40/41: NOT DETECTED
ASTROVIRUS: NOT DETECTED
CAMPYLOBACTER SPECIES: NOT DETECTED
Cryptosporidium: NOT DETECTED
Cyclospora cayetanensis: NOT DETECTED
ENTEROPATHOGENIC E COLI (EPEC): NOT DETECTED
ENTEROTOXIGENIC E COLI (ETEC): NOT DETECTED
Entamoeba histolytica: NOT DETECTED
Enteroaggregative E coli (EAEC): NOT DETECTED
Giardia lamblia: NOT DETECTED
Norovirus GI/GII: NOT DETECTED
PLESIMONAS SHIGELLOIDES: NOT DETECTED
ROTAVIRUS A: NOT DETECTED
Salmonella species: NOT DETECTED
Sapovirus (I, II, IV, and V): NOT DETECTED
Shiga like toxin producing E coli (STEC): NOT DETECTED
Shigella/Enteroinvasive E coli (EIEC): NOT DETECTED
VIBRIO SPECIES: NOT DETECTED
Vibrio cholerae: NOT DETECTED
Yersinia enterocolitica: NOT DETECTED

## 2016-09-10 LAB — GLUCOSE, CAPILLARY: GLUCOSE-CAPILLARY: 114 mg/dL — AB (ref 65–99)

## 2016-09-10 LAB — COMPREHENSIVE METABOLIC PANEL
ALK PHOS: 191 U/L — AB (ref 38–126)
ALT: 15 U/L (ref 14–54)
AST: 19 U/L (ref 15–41)
Albumin: <1 g/dL — ABNORMAL LOW (ref 3.5–5.0)
Anion gap: 6 (ref 5–15)
BILIRUBIN TOTAL: 0.8 mg/dL (ref 0.3–1.2)
BUN: 23 mg/dL — ABNORMAL HIGH (ref 6–20)
CALCIUM: 6.6 mg/dL — AB (ref 8.9–10.3)
CHLORIDE: 107 mmol/L (ref 101–111)
CO2: 22 mmol/L (ref 22–32)
CREATININE: 1.13 mg/dL — AB (ref 0.44–1.00)
GFR, EST AFRICAN AMERICAN: 60 mL/min — AB (ref >60)
GFR, EST NON AFRICAN AMERICAN: 52 mL/min — AB (ref >60)
Glucose, Bld: 67 mg/dL (ref 65–99)
Potassium: 4.4 mmol/L (ref 3.5–5.1)
Sodium: 135 mmol/L (ref 135–145)
Total Protein: 4.7 g/dL — ABNORMAL LOW (ref 6.5–8.1)

## 2016-09-10 LAB — C DIFFICILE QUICK SCREEN W PCR REFLEX
C DIFFICILE (CDIFF) INTERP: NOT DETECTED
C DIFFICILE (CDIFF) TOXIN: NEGATIVE
C Diff antigen: NEGATIVE

## 2016-09-10 LAB — TYPE AND SCREEN
ABO/RH(D): O POS
Antibody Screen: POSITIVE
DAT, IgG: POSITIVE

## 2016-09-10 LAB — CBC
HCT: 29.8 % — ABNORMAL LOW (ref 36.0–46.0)
HEMOGLOBIN: 9.9 g/dL — AB (ref 12.0–15.0)
MCH: 30.7 pg (ref 26.0–34.0)
MCHC: 33.2 g/dL (ref 30.0–36.0)
MCV: 92.5 fL (ref 78.0–100.0)
PLATELETS: 76 10*3/uL — AB (ref 150–400)
RBC: 3.22 MIL/uL — AB (ref 3.87–5.11)
RDW: 17.3 % — ABNORMAL HIGH (ref 11.5–15.5)
WBC: 13.8 10*3/uL — AB (ref 4.0–10.5)

## 2016-09-10 LAB — IRON AND TIBC
Iron: 36 ug/dL (ref 28–170)
Saturation Ratios: 37 % — ABNORMAL HIGH (ref 10.4–31.8)
TIBC: 98 ug/dL — AB (ref 250–450)
UIBC: 62 ug/dL

## 2016-09-10 LAB — CBG MONITORING, ED
GLUCOSE-CAPILLARY: 69 mg/dL (ref 65–99)
GLUCOSE-CAPILLARY: 67 mg/dL (ref 65–99)

## 2016-09-10 LAB — TSH: TSH: 1.045 u[IU]/mL (ref 0.350–4.500)

## 2016-09-10 LAB — FOLATE: Folate: 12.8 ng/mL (ref >5.9)

## 2016-09-10 LAB — LACTIC ACID, PLASMA
Lactic Acid, Venous: 1.3 mmol/L (ref 0.5–1.9)
Lactic Acid, Venous: 1.3 mmol/L (ref 0.5–1.9)

## 2016-09-10 LAB — VITAMIN B12: Vitamin B-12: 2570 pg/mL — ABNORMAL HIGH (ref 180–914)

## 2016-09-10 LAB — RETICULOCYTES
RBC.: 3.1 MIL/uL — ABNORMAL LOW (ref 3.87–5.11)
RETIC CT PCT: 3.1 % (ref 0.4–3.1)
Retic Count, Absolute: 96.1 10*3/uL (ref 19.0–186.0)

## 2016-09-10 LAB — VAS US LOWER EXTREMITY ARTERIAL DUPLEX
RIGHT POST TIB MID SYS: 107 cm/s
RPOPDPSV: 87 cm/s
RPOPPPSV: 86 cm/s
RPTIBPSV: 95 cm/s
RSFDPSV: -186 cm/s
RSFMPSV: 170 cm/s
RTIBDISTSYS: 113 cm/s
Right super femoral prox sys PSV: 195 cm/s

## 2016-09-10 LAB — DIFFERENTIAL
BASOS ABS: 0 10*3/uL (ref 0.0–0.1)
BASOS PCT: 0 %
Eosinophils Absolute: 0 10*3/uL (ref 0.0–0.7)
Eosinophils Relative: 0 %
Lymphocytes Relative: 11 %
Lymphs Abs: 1.5 10*3/uL (ref 0.7–4.0)
Monocytes Absolute: 0.8 10*3/uL (ref 0.1–1.0)
Monocytes Relative: 6 %
NEUTROS ABS: 11.4 10*3/uL — AB (ref 1.7–7.7)
Neutrophils Relative %: 83 %

## 2016-09-10 LAB — SODIUM, URINE, RANDOM: Sodium, Ur: <10 mmol/L

## 2016-09-10 LAB — TROPONIN I: Troponin I: <0.03 ng/mL (ref <0.03)

## 2016-09-10 LAB — FERRITIN: FERRITIN: 585 ng/mL — AB (ref 11–307)

## 2016-09-10 LAB — APTT: APTT: 36 s (ref 24–36)

## 2016-09-10 LAB — PROTIME-INR
INR: 1.29
Prothrombin Time: 16.2 seconds — ABNORMAL HIGH (ref 11.4–15.2)

## 2016-09-10 LAB — CREATININE, URINE, RANDOM: CREATININE, URINE: 54.61 mg/dL

## 2016-09-10 LAB — MAGNESIUM: Magnesium: 1.5 mg/dL — ABNORMAL LOW (ref 1.7–2.4)

## 2016-09-10 LAB — PROCALCITONIN: Procalcitonin: 0.24 ng/mL

## 2016-09-10 LAB — HEMOGLOBIN A1C
HEMOGLOBIN A1C: 4.7 % — AB (ref 4.8–5.6)
Mean Plasma Glucose: 88.19 mg/dL

## 2016-09-10 MED ORDER — ASPIRIN EC 81 MG PO TBEC
81.0000 mg | DELAYED_RELEASE_TABLET | Freq: Every day | ORAL | Status: DC
  Administered 2016-09-11 – 2016-10-21 (×36): 81 mg via ORAL
  Filled 2016-09-10 (×38): qty 1

## 2016-09-10 MED ORDER — VANCOMYCIN HCL IN DEXTROSE 1-5 GM/200ML-% IV SOLN: 1000.0000 mg | Freq: Once | INTRAVENOUS | Status: DC

## 2016-09-10 MED ORDER — DEXTROSE 50 % IV SOLN
INTRAVENOUS | Status: AC
  Administered 2016-09-10: 18:00:00
  Filled 2016-09-10: qty 50

## 2016-09-10 MED ORDER — ALUM & MAG HYDROXIDE-SIMETH 200-200-20 MG/5ML PO SUSP
30.0000 mL | Freq: Two times a day (BID) | ORAL | Status: DC | PRN
  Administered 2016-09-13 – 2016-10-02 (×2): 30 mL via ORAL
  Filled 2016-09-10 (×2): qty 30

## 2016-09-10 MED ORDER — TIOTROPIUM BROMIDE MONOHYDRATE 18 MCG IN CAPS
18.0000 ug | ORAL_CAPSULE | Freq: Every day | RESPIRATORY_TRACT | Status: DC
  Administered 2016-09-10 – 2016-09-17 (×6): 18 ug via RESPIRATORY_TRACT
  Filled 2016-09-10 (×3): qty 5

## 2016-09-10 MED ORDER — ACETAMINOPHEN 325 MG PO TABS
650.0000 mg | ORAL_TABLET | Freq: Four times a day (QID) | ORAL | Status: DC | PRN
  Administered 2016-09-15 – 2016-10-21 (×12): 650 mg via ORAL
  Filled 2016-09-10 (×14): qty 2

## 2016-09-10 MED ORDER — DEXTROSE-NACL 5-0.9 % IV SOLN
INTRAVENOUS | Status: DC
  Administered 2016-09-10: 22:00:00 via INTRAVENOUS

## 2016-09-10 MED ORDER — FLEET ENEMA 7-19 GM/118ML RE ENEM
1.0000 | ENEMA | Freq: Once | RECTAL | Status: DC | PRN
  Filled 2016-09-10: qty 1

## 2016-09-10 MED ORDER — ASPIRIN 81 MG PO TBEC: 81.0000 mg | DELAYED_RELEASE_TABLET | Freq: Every day | ORAL | Status: DC

## 2016-09-10 MED ORDER — OXYCODONE HCL 5 MG PO TABS: 10.0000 mg | ORAL_TABLET | Freq: Two times a day (BID) | ORAL | Status: DC | PRN

## 2016-09-10 MED ORDER — ALPRAZOLAM 0.25 MG PO TABS: 0.2500 mg | ORAL_TABLET | Freq: Two times a day (BID) | ORAL | Status: DC | PRN

## 2016-09-10 MED ORDER — FLUTICASONE PROPIONATE 50 MCG/ACT NA SUSP: 2.0000 | Freq: Every day | NASAL | Status: DC | PRN

## 2016-09-10 MED ORDER — ONDANSETRON HCL 4 MG PO TABS: 4.0000 mg | ORAL_TABLET | Freq: Four times a day (QID) | ORAL | Status: DC | PRN

## 2016-09-10 MED ORDER — VANCOMYCIN HCL IN DEXTROSE 750-5 MG/150ML-% IV SOLN
750.0000 mg | Freq: Two times a day (BID) | INTRAVENOUS | Status: DC
  Administered 2016-09-10 – 2016-09-12 (×4): 750 mg via INTRAVENOUS
  Filled 2016-09-10 (×5): qty 150

## 2016-09-10 MED ORDER — ONDANSETRON HCL 4 MG/2ML IJ SOLN
4.0000 mg | Freq: Four times a day (QID) | INTRAMUSCULAR | Status: DC | PRN
  Administered 2016-09-13 – 2016-09-29 (×17): 4 mg via INTRAVENOUS
  Filled 2016-09-10 (×18): qty 2

## 2016-09-10 MED ORDER — SODIUM CHLORIDE 0.9 % IV SOLN: INTRAVENOUS | Status: DC

## 2016-09-10 MED ORDER — SORBITOL 70 % SOLN: 30.0000 mL | Freq: Every day | Status: DC | PRN

## 2016-09-10 MED ORDER — SENNOSIDES-DOCUSATE SODIUM 8.6-50 MG PO TABS
1.0000 | ORAL_TABLET | Freq: Every evening | ORAL | Status: DC | PRN
  Administered 2016-10-21: 1 via ORAL
  Filled 2016-09-10: qty 1

## 2016-09-10 MED ORDER — LEVALBUTEROL HCL 0.63 MG/3ML IN NEBU: 0.6300 mg | INHALATION_SOLUTION | RESPIRATORY_TRACT | Status: DC | PRN

## 2016-09-10 MED ORDER — SODIUM CHLORIDE 0.9% FLUSH
3.0000 mL | Freq: Two times a day (BID) | INTRAVENOUS | Status: DC
  Administered 2016-09-10 – 2016-10-18 (×48): 3 mL via INTRAVENOUS

## 2016-09-10 MED ORDER — ACETAMINOPHEN 650 MG RE SUPP: 650.0000 mg | Freq: Four times a day (QID) | RECTAL | Status: DC | PRN

## 2016-09-10 MED ORDER — CITALOPRAM HYDROBROMIDE 10 MG PO TABS
40.0000 mg | ORAL_TABLET | Freq: Every day | ORAL | Status: DC
  Administered 2016-09-10 – 2016-09-17 (×8): 40 mg via ORAL
  Filled 2016-09-10 (×8): qty 4

## 2016-09-10 MED ORDER — CLONAZEPAM 0.5 MG PO TABS
1.0000 mg | ORAL_TABLET | Freq: Every evening | ORAL | Status: DC | PRN
  Administered 2016-09-10 – 2016-09-13 (×4): 1 mg via ORAL
  Filled 2016-09-10 (×4): qty 2

## 2016-09-10 MED ORDER — TRAMADOL HCL 50 MG PO TABS
50.0000 mg | ORAL_TABLET | Freq: Four times a day (QID) | ORAL | Status: DC | PRN
  Administered 2016-09-14 – 2016-10-10 (×35): 50 mg via ORAL
  Filled 2016-09-10 (×36): qty 1

## 2016-09-10 MED ORDER — ALUM & MAG HYDROXIDE-SIMETH 200-200-20 MG/5ML PO SUSP: 30.0000 mL | Freq: Two times a day (BID) | ORAL | Status: DC | PRN

## 2016-09-10 NOTE — Progress Notes (Signed)
Dear Doctor:   This patient has been identified as a candidate for picc for the following reason (s): IV therapy over 48 hours, poor veins/poor circulatory system (CHF, COPD, emphysema, diabetes, steroid use, IV drug abuse, etc.) and restarts due to phlebitis and infiltration in 24 hours If you agree, please write an order for the indicated device. For any questions contact the Vascular Access Team at [phone] if no answer, please leave a message.  Thank you for supporting the early vascular access assessment program.

## 2016-09-10 NOTE — ED Notes (Signed)
Bed: HK25 Expected date:  Expected time:  Means of arrival:  Comments: Hold for room 4

## 2016-09-10 NOTE — Progress Notes (Signed)
09/10/2016 1830 Received pt to room 4E-20 from [facility] via [facility].  Pt is A&O, no c/o pain.  Oriented to room, call light and bed.  Tele monitor applied and CCMD notified.  Call bell in reach and husband at bedside. [place]

## 2016-09-10 NOTE — ED Notes (Signed)
Attempt once to collect labs unsuccessful RN have been made aware

## 2016-09-10 NOTE — Plan of Care (Signed)
Problem: Pain Managment: Goal: General experience of comfort will improve Outcome: Progressing Denies pain at this time   

## 2016-09-10 NOTE — Progress Notes (Signed)
Pharmacy Antibiotic Note  Haley Graham is a 61 y.o. female admitted on 09/10/2016 with sepsis probable colitis, and AKI.  Pharmacy has been consulted for Levaquin, Flagyl, and vancomycin dosing.  Patient received Levaquin 750 mg IV x 1 and Flagyl 500 mg IV x 1 in ED earlier today.  Has history of significant penicilin allergy.  Plan:  Increase vancomycin to 750 mg IV q12 hr, VT goal 15-20 mcg/mL  Adjust Flagyl to 500 mg IV q8 hr  Continue Levaquin as ordered; note that patient tolerates cefepime, so would consider changing if clinically appropriate to avoid FQ ADEs.  CrCl appears to be improving, will recheck SCr tomorrow   Height: 5\' 3"  (160 cm) IBW/kg (Calculated) : 52.4  No data recorded.   Recent Labs Lab 09/04/2016 1139 09/14/2016 1449 09/29/2016 2034 09/10/16 0558 09/10/16 0949  WBC 21.0*  --   --   --  13.8*  CREATININE 1.45*  --   --   --  1.13*  LATICACIDVEN  --  3.24* 2.65* 1.3 1.3    Estimated Creatinine Clearance: 50.4 mL/min (A) (by C-G formula based on SCr of 1.13 mg/dL (H)).    Allergies  Allergen Reactions  . Penicillins Rash and Other (See Comments)    PATIENT HAS HAD A PCN REACTION WITH IMMEDIATE RASH, FACIAL/TONGUE/THROAT SWELLING, SOB, OR LIGHTHEADEDNESS WITH HYPOTENSION:  #  #  #  YES  #  #  #   Has patient had a PCN reaction causing severe rash involving mucus membranes or skin necrosis: no Has patient had a PCN reaction that required hospitalization no Has patient had a PCN reaction occurring within the last 10 years:no If all of the above answers are "NO", then may proceed with Cephalosporin use.  . Ativan [Lorazepam] Other (See Comments)    Makes pt very confused and more agitated, irritable.  . Codeine Nausea And Vomiting  . Reglan [Metoclopramide] Other (See Comments)    TACHYCARDIA    Antimicrobials this admission: 8/8 Flagyl >>  8/8 Levaquin >> 8/8 Vancomycin >>   Dose adjustments this admission: n/a   Microbiology results: 8/8 blood  cx: collected 8/8 GI panel: neg 8/8 Cdiff: neg/neg   Thank you for allowing pharmacy to be a part of this patient's care.  Reuel Boom, PharmD, BCPS Pager: [phone] 09/10/2016, 4:17 PM

## 2016-09-10 NOTE — Progress Notes (Addendum)
*  PRELIMINARY RESULTS* Vascular Ultrasound ARTERIAL ABI completed:    RIGHT    LEFT    PRESSURE WAVEFORM  PRESSURE WAVEFORM  BRACHIAL 139 Triphasic BRACHIAL  Triphasic  DP 128 Biphasic DP 107 Monophasic  AT   AT    PT 139 Biphasic PT 100 Monophasic  PER   PER    GREAT TOE 66 NA GREAT TOE  Absent    RIGHT LEFT  ABI 1.00 0.77   The right ABI is within normal limits at rest. The left ABI is suggestive of moderate arterial insufficiency at rest.  Right Lower Extremity Arterial Duplex has been completed.   There is no obvious evidence of dissection involving the visualized arteries of the right lower extremity. Visualized arteries are patent with biphasic flow through to the distal posterior tibial artery. The right common femoral artery exhibits elevated velocities (271cm/s PSV), suggestive of possible 50-74% stenosis. Incidental finding: there is a loculated area of the right groin adjacent to the right external iliac artery with no evidence of internal vascularity. This area measures 3.5 x 3.0 x 4.2cm. Etiology is unknown.   09/10/2016 10:18 AM Maudry Mayhew, BS, RVT, RDCS, RDMS

## 2016-09-10 NOTE — Progress Notes (Signed)
Pharmacy - Potential Drug Interaction  Have take liberty to discontinue prn GI Cocktail order to reduce chance for drug interaction with flagyl.    The antimicrobial effectiveness of metroNIDAZOLE may be decreased by belladonna-PHENObarbital.  Interacting Medications/Orders: Metronidazole & Derivatives Oral or Non-Oral, Systemic Barbiturates Oral or Non-Oral, Systemic  1. metroNIDAZOLE  Order ([id number]): metroNIDAZOLE (FLAGYL) IVPB 500 mg Route: Intravenous Start: 09/02/2016 2200 End: none Frequency: Every 6 hours 1. belladonna-PHENObarbital  Order: gi cocktail (Maalox,Lidocaine,Donnatal) Route: Oral Start: 09/10/2016 2259 End: none Frequency: 3 times daily PRN   Effects: The antimicrobial effectiveness of metroNIDAZOLE may be decreased. Mechanism: belladonna-PHENObarbital may increase the hepatic hydroxylation of metroNIDAZOLE.  Thanks Clydia Llano, [place], [place] Pager: [phone]

## 2016-09-10 NOTE — Progress Notes (Signed)
PROGRESS NOTE  Haley Graham  SWH:[id number] DOB: 08/19/1955 DOA: 09/15/2016 PCP: Glenda Chroman, MD   Brief Narrative: Haley Graham is a 61 y.o. 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 Patient states has had significant generalized weakness over the past 3-7 days to the point which was unable to ambulate with some associated nausea and not feeling well, chills, decreased oral intake. Patient states since his surgery during the last was positioned she's been having loose stool/diarrhea as well as decreased oral intake. Patient denies any fevers, no emesis, no shortness of breath, no abdominal pain, no dysuria, no hematemesis, no melena, no hematochezia, no cough, no dysuria. Patient does endorse some midsternal chest pain across her chest which is nonradiating and intermittent in nature. Patient was seen at the cardiothoracic surgeon's office on the day of admission with his symptoms of generalized significant weakness. Chest x-ray obtained showed aortic stent grafting good position with no effusions or signs of heart failure. It was felt at that time that patient's generalized weakness/failure to thrive was likely secondary to poor oral intake secondary to chronic mesenteric ischemia and vascular disease. It was recommended patient be transferred to [facility] ED for further evaluation. Patient subsequently ended up at [facility] long ED.  ED Course: On presentation to the ED patient was noted to have systolic blood pressures in the 80s to 90s, tachycardia, leukocytosis with a white count of 21,000, platelet count of 84.  Lactic acid level was elevated at 3.24. Comprehensive metabolic profile with a sodium of 1:30 chloride of 99 glucose of 161 BUN of 28 creatinine of 1.45. Urinalysis was not done. Chest x-ray noted stenting aortic arch and descending thoracic aortic regions were stable. Patchy bibasilar atelectasis is noted. CT head done was negative for any acute abnormalities.  CT angiogram chest abdomen and pelvis showed interval placement of stent graft in the transverse aortic arch and proximal descending thoracic aorta for treatment of type B thoracic aortic dissection. Descending thoracic aorta seen to extend into abdominal aorta with stable thrombosis of 2 lm below the left renal artery. 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 Wall enhancement of several loops of small bowel as well as large bowel which may represent colitis and/or enteritis. No pneumatosis seen. Interval development of linear filling defect in the right external iliac artery approximately which may represent thrombus, dissection or combination thereof. Patient started on the sepsis protocol and given a bolus of IV fluids as well as IV Levaquin and IV Flagyl. [facility] were called to admit the patient.   Assessment & Plan: Principal Problem:   Sepsis ([place]) Active Problems:   GERD (gastroesophageal reflux disease)   Dissection of thoracoabdominal aorta (HCC)   AAA (abdominal aortic aneurysm) (HCC)   Benign essential HTN   Diabetes mellitus type 2 in obese (HCC)   Anxiety state   Hyponatremia   Leukocytosis   Hypotension   AKI (acute kidney injury) ([place])   Dehydration   Weakness   Anemia   Thrombocytopenia (HCC)   Abnormal computed tomography angiography (CTA) of abdomen and pelvis   Colitis: Probable  Sepsis due to possible colitis/enteritis: Based on CTA chest/abd/pelvis with bowel wall enhancement  and GI symptoms. Lactate elevation without acidosis, resolved.  - Continue  IV antibiotics, levaquin, vancomycin, and flagyl (PCN-allergic) - Blood cultures sent - Monitor urine culture (no pyuria, but nitrite +) - Check GI pathogen panel. CDiff negative.  - Given IVF's, will hold these for now and rebolus if recurrently hypotensive.  - Monitor leukocytosis - Empiric enteric precautions  Hypotension: Multifactorial including infectious/sepsis with intra-abdominal etiology (no pulmonary infiltrates or pyuria), hemorrhage (GI vs. vascular), cardiogenic (though troponin was negative). Could contribute to ischemia in setting of PVD. - Some improvement with IV fluids, will need intensive monitoring in SDU - Cycle cardiac enzymes - With hypoalbuminemia and pitting edema, suspect 3rd spacing. May need albumin if pressures drop recurrently.   Dehydration/poor per oral intake: Related to GI infection vs. chronic mesenteric ischemia.  - IV fluids as above. May need maintenance rate if not taking po today - Nutrition consult, albumin is low, indicating chronically poor nutritional status.  Normocytic anemia: Likely secondary to recent dissection. B12 not low at 2.5k, iron panel not deficient.  - Monitor closely with transfusion threshold of 7 mg/dl.  - Rechecking CBC now (anticipate could drop with hydration, was 9.3 at recent discharge)  COPD: Patient states he quit smoking cigarettes since last hospitalization. No exacerbation at this time. - Continue home nebulizers and prns  Acute kidney injury: Likely secondary to prerenal azotemia in the setting of dehydration/hypotension, poor oral intake. Urine sodium low consistent with prerenal etiology. May also be related to left renal artery ?thrombosis seen on CTA. - Renal U/S - Cr has not been trended while in ED, will check now.  - If no improvement, may need nephrology input.  Abnormal CT angiogram chest abdomen and pelvis CT angiogram chest abdomen and pelvis concerning for thrombosis of left renal artery which might  be progressive. Patient with significant lower abdominal pain. Patient with generalized weakness. CT angiogram abdomen and pelvis also concerning for possible colitis.  - Case has been discussed with vascular surgery who have formally been consulted for further evaluation and management. Keep patient on clear liquids for now.  Thrombocytopenia: Patient with no overt bleeding. Patient with thrombocytopenia since last hospitalization where workup which was done was negative.  - SCDs for DVT ppx - Monitor closely   GERD: Chronic, stable - PPI  Anxiety: Chronic, stable - Continue home regimen Celexa, Xanax prn, and Klonopin qHS.  [place] type B aortic dissection s/p descending thoracic aortic stent graft placement: Chest x-ray and CT angiogram chest show stable appearance of stents.  - Due to hypotension will hold beta blocker for now. - Follow H&H.  - Vascular surgery has been consulted for this and other CTA findings as above.  Essential hypertension: Chronic, currently hypotensive.  - Antihypertensive medications on hold secondary to hypotension.  DVT prophylaxis: SCDs Code Status: Full Family Communication: None at bedside this AM Disposition Plan: Transfer to [facility] SDU once bed available. CSW consulted for likely eventual SNF placement.  Consultants:   Vascular surgery, Dr. Bridgett Larsson  Procedures:   None  Antimicrobials:  Vancomycin, levaquin, flagyl   Subjective: Pt did not sleep well last night in ED, otherwise reporting improvement in abdominal pain and no diarrhea (though she cannot remember for sure). She denies chest pain or dyspnea.   Objective: Vitals:   09/10/16 0430 09/10/16 0500 09/10/16 0530 09/10/16 0600  BP: (!) 109/47 (!) 122/43 (!) 141/58 (!) 132/53  Pulse: 87 87  99  Resp: 17 16 17  (!) 21  Temp:  TempSrc:      SpO2: 98% 98%  100%    Intake/Output Summary (Last 24 hours) at 09/10/16 0724 Last data filed at 09/10/16 0056  Gross per 24 hour    Intake             1300 ml  Output              420 ml  Net              880 ml   Examination: General exam: Chronically ill-appearing female in no distress Respiratory system: Non-labored breathing room air. Clear to auscultation bilaterally.  Cardiovascular system: Regular rate and rhythm. No murmur, rub, or gallop. No JVD, and 2+ pitting edema x4 extremities. Gastrointestinal system: Abdomen soft, diffusely tender without rebound, non-distended, with normoactive bowel sounds. No organomegaly or masses felt. Central nervous system: Alert and oriented. No focal neurological deficits. Extremities: Warm, no deformities Skin: Scattered ecchymoses without active bleeding. Otherwise, no rashes or ulcers Psychiatry: Judgement and insight appear normal. Pt is suspicious of provider, asking to see ID multiple times. Mood is anxious & affect withdrawn   Data Reviewed: I have personally reviewed following labs and imaging studies  CBC:  Recent Labs Lab 09/02/2016 1139  WBC 21.0*  NEUTROABS 18.2*  HGB 10.1*  HCT 30.3*  MCV 94.4  PLT 84*   Basic Metabolic Panel:  Recent Labs Lab 09/12/2016 1139  NA 130*  K 4.9  CL 99*  CO2 24  GLUCOSE 161*  BUN 28*  CREATININE 1.45*  CALCIUM 7.0*   GFR: Estimated Creatinine Clearance: 39.3 mL/min (A) (by C-G formula based on SCr of 1.45 mg/dL (H)). Liver Function Tests:  Recent Labs Lab 10/02/2016 1139  AST 21  ALT 15  ALKPHOS 184*  BILITOT 0.6  PROT 4.7*  ALBUMIN 1.0*    Recent Labs Lab 09/03/2016 1139  LIPASE 14   No results for input(s): AMMONIA in the last 168 hours. Coagulation Profile: No results for input(s): INR, PROTIME in the last 168 hours. Cardiac Enzymes:  Recent Labs Lab 09/21/2016 1139  TROPONINI <0.03   BNP (last 3 results) No results for input(s): PROBNP in the last 8760 hours. HbA1C: No results for input(s): HGBA1C in the last 72 hours. CBG:  Recent Labs Lab 09/02/2016 2211 09/20/2016 2337  GLUCAP 63* 86    Lipid Profile: No results for input(s): CHOL, HDL, LDLCALC, TRIG, CHOLHDL, LDLDIRECT in the last 72 hours. Thyroid Function Tests: No results for input(s): TSH, T4TOTAL, FREET4, T3FREE, THYROIDAB in the last 72 hours. Anemia Panel:  Recent Labs  09/21/2016 2011  VITAMINB12 2,570*  FERRITIN 585*  TIBC 98*  IRON 36   Urine analysis:    Component Value Date/Time   COLORURINE YELLOW 09/22/2016 1140   APPEARANCEUR CLEAR 09/08/2016 1140   LABSPEC 1.033 (H) 09/06/2016 1140   PHURINE 6.0 09/23/2016 1140   GLUCOSEU NEGATIVE 09/02/2016 1140   HGBUR NEGATIVE 09/29/2016 1140   BILIRUBINUR NEGATIVE 09/05/2016 1140   KETONESUR NEGATIVE 09/18/2016 1140   PROTEINUR NEGATIVE 09/22/2016 1140   UROBILINOGEN 0.2 12/19/2011 0820   NITRITE POSITIVE (A) 09/18/2016 1140   LEUKOCYTESUR NEGATIVE 10/02/2016 1140   Recent Results (from the past 240 hour(s))  C difficile quick scan w PCR reflex     Status: None   Collection Time: 09/04/2016  1:30 AM  Result Value Ref Range Status   C Diff antigen NEGATIVE NEGATIVE Final   C Diff toxin NEGATIVE NEGATIVE Final   C Diff interpretation No C.  difficile detected.  Final      Radiology Studies: Dg Chest 2 View  Result Date: 09/13/2016 CLINICAL DATA:  History of aortic dissection with stent in place EXAM: CHEST  2 VIEW COMPARISON:  August 15, 2016 FINDINGS: There is a stent extending from the aortic arch into the mid descending thoracic aorta, stable. There is patchy bibasilar atelectatic change. Lungs elsewhere clear. Heart size and pulmonary vascularity are normal. The aortic contour appears stable. No adenopathy. No bone lesions. IMPRESSION: Stent in aortic arch and descending thoracic aortic regions, stable. Aortic contour appears stable compared to recent study by radiography. Heart size normal. Patchy bibasilar atelectasis noted. Lungs elsewhere clear. Electronically Signed   By: Lowella Grip III M.D.   On: 09/02/2016 09:35   Ct Head Wo  Contrast  Result Date: 09/30/2016 CLINICAL DATA:  Altered level of consciousness EXAM: CT HEAD WITHOUT CONTRAST TECHNIQUE: Contiguous axial images were obtained from the base of the skull through the vertex without intravenous contrast. COMPARISON:  12/31/2004 FINDINGS: Brain: Mild low-density in the periventricular white matter is compatible with chronic ischemic changes of small vessel disease. There is no mass effect, midline shift, or acute hemorrhage. Vascular: No hyperdense vessel or unexpected calcification. Skull: Intact. Sinuses/Orbits: Small amount of fluid in the mastoid air cells. Visualized paranasal sinuses are clear. Orbits are unremarkable. Other: Noncontributory. IMPRESSION: No acute intracranial pathology.  Chronic ischemic changes. Electronically Signed   By: Marybelle Killings M.D.   On: 09/24/2016 14:32   Ct Angio Chest/abd/pel For Dissection W And/or Wo Contrast  Result Date: 09/08/2016 CLINICAL DATA:  Thoracic aortic dissection. EXAM: CT ANGIOGRAPHY CHEST, ABDOMEN AND PELVIS TECHNIQUE: Multidetector CT imaging through the chest, abdomen and pelvis was performed using the standard protocol during bolus administration of intravenous contrast. Multiplanar reconstructed images and MIPs were obtained and reviewed to evaluate the vascular anatomy. CONTRAST:  80 mL of Isovue 370 intravenously. COMPARISON:  CT scan August 05, 2016. FINDINGS: CTA CHEST FINDINGS Cardiovascular: Interval placement of stent graft for treatment of type B thoracic aortic dissection. The stent extends proximally from the origin of the right innominate artery into the proximal descending thoracic aorta. Normal contrast filling of the great vessels is noted, with continued presence of moderate stenosis involving the origin of the left subclavian artery. Dissection is seen to extend throughout the descending thoracic aorta into the abdominal aorta. No pericardial effusion is noted. Normal cardiac size. Mediastinum/Nodes: No enlarged  mediastinal, hilar, or axillary lymph nodes. Thyroid gland, trachea, and esophagus demonstrate no significant findings. Lungs/Pleura: No pneumothorax or pleural effusion is noted. Mild bilateral posterior basilar subsegmental atelectasis is noted. Musculoskeletal: No chest wall abnormality. No acute or significant osseous findings. Review of the MIP images confirms the above findings. CTA ABDOMEN AND PELVIS FINDINGS VASCULAR Aorta: Dissection from thoracic aorta is noted to extend into abdominal aorta, although once again the true lumen is noted to be thrombosed starting at the level of the left renal artery. Dissection flap extends to aortic bifurcation and into right common iliac artery. Celiac: Patent without evidence of aneurysm, dissection, vasculitis or significant stenosis. SMA: Patent without evidence of aneurysm, dissection, vasculitis or significant stenosis. Renals: Right renal artery arises from false lumen and appears to be widely patent without stenosis. Left renal artery origin is seen overlying dissection flap and is thrombosed as noted on prior exam. IMA: Patent without evidence of aneurysm, dissection, vasculitis or significant stenosis. Inflow: As noted previously, thrombosed dissection flap is seen extending into right common iliac artery.  There is interval development of linear filling defect extending throughout proximal portion of right external iliac artery with associated calcifications which may represent further extension of dissection, thrombosis or a combination thereof. Veins: No obvious venous abnormality within the limitations of this arterial phase study. Review of the MIP images confirms the above findings. NON-VASCULAR Hepatobiliary: Status post cholecystectomy. Fatty infiltration of the liver. Pancreas: Unremarkable. No pancreatic ductal dilatation or surrounding inflammatory changes. Spleen: Normal in size without focal abnormality. Adrenals/Urinary Tract: Stable left adrenal  nodule. Right adrenal gland appears normal. Progressive atrophy of left kidney is noted consistent with previously described thrombosed left renal artery. Right kidney and ureter appear normal. Urinary bladder is unremarkable. Stomach/Bowel: The stomach is unremarkable. The appendix appears normal. There is no evidence of bowel obstruction. There is noted wall enhancement of several loops of small bowel and the colon which may represent inflammation. Lymphatic: No significant adenopathy is noted. Reproductive: Status post hysterectomy. No adnexal masses. Other: No abdominal wall hernia or abnormality. No abdominopelvic ascites. Fluid collection is seen in the right inguinal region consistent with recent stent graft placement. Musculoskeletal: No acute or significant osseous findings. Review of the MIP images confirms the above findings. IMPRESSION: Interval placement of stent graft in transverse aortic arch and proximal descending thoracic aorta for treatment of type B thoracic aortic dissection. Descending thoracic aorta is seen to extend into the abdominal aorta, with stable thrombosis of the true lumen below the left renal artery. Continued thrombosis of left renal artery is noted with progressive atrophy of the left kidney as a result. Fatty infiltration of the liver is noted. There is noted wall enhancement of several loops of small bowel as well as the large bowel which may represent colitis and/or enteritis. No pneumatosis is seen at this time. There is noted interval development of linear filling defect in the right external iliac artery proximally which may represent thrombus, dissection or combination thereof. Potentially this may be related to stent graft placement. Critical Value/emergent results were called by telephone at the time of interpretation on 09/03/2016 at 2:59 pm to Dr. Darl Householder, who verbally acknowledged these results. Electronically Signed   By: Marijo Conception, M.D.   On: 09/04/2016 15:01     Scheduled Meds: . insulin aspart  0-15 Units Subcutaneous TID WC  . insulin aspart  0-5 Units Subcutaneous QHS  . mometasone-formoterol  2 puff Inhalation BID  . pantoprazole (PROTONIX) IV  40 mg Intravenous Q24H   Continuous Infusions: . levofloxacin (LEVAQUIN) IV    . metronidazole Stopped (09/10/16 0039)  . sodium chloride 0.9 % 1,000 mL infusion    . vancomycin       LOS: 1 day   Time spent: 35 minutes.  Vance Gather, MD [facility] Pager [phone]  If 7PM-7AM, please contact night-coverage [email or web address] Password [email or web address] 09/10/2016, 7:24 AM

## 2016-09-11 DIAGNOSIS — I714 Abdominal aortic aneurysm, without rupture: Secondary | ICD-10-CM

## 2016-09-11 DIAGNOSIS — E871 Hypo-osmolality and hyponatremia: Secondary | ICD-10-CM

## 2016-09-11 DIAGNOSIS — R627 Adult failure to thrive: Secondary | ICD-10-CM

## 2016-09-11 LAB — BASIC METABOLIC PANEL
Anion gap: 7 (ref 5–15)
BUN: 21 mg/dL — ABNORMAL HIGH (ref 6–20)
CHLORIDE: 108 mmol/L (ref 101–111)
CO2: 19 mmol/L — ABNORMAL LOW (ref 22–32)
CREATININE: 1.17 mg/dL — AB (ref 0.44–1.00)
Calcium: 6.9 mg/dL — ABNORMAL LOW (ref 8.9–10.3)
GFR calc non Af Amer: 50 mL/min — ABNORMAL LOW (ref >60)
GFR, EST AFRICAN AMERICAN: 57 mL/min — AB (ref >60)
Glucose, Bld: 110 mg/dL — ABNORMAL HIGH (ref 65–99)
POTASSIUM: 4.8 mmol/L (ref 3.5–5.1)
SODIUM: 134 mmol/L — AB (ref 135–145)

## 2016-09-11 LAB — GLUCOSE, CAPILLARY
GLUCOSE-CAPILLARY: 113 mg/dL — AB (ref 65–99)
Glucose-Capillary: 82 mg/dL (ref 65–99)
Glucose-Capillary: 130 mg/dL — ABNORMAL HIGH (ref 65–99)
Glucose-Capillary: 103 mg/dL — ABNORMAL HIGH (ref 65–99)
Glucose-Capillary: 119 mg/dL — ABNORMAL HIGH (ref 65–99)

## 2016-09-11 LAB — CBC
HCT: 28.4 % — ABNORMAL LOW (ref 36.0–46.0)
Hemoglobin: 10.1 g/dL — ABNORMAL LOW (ref 12.0–15.0)
MCH: 34.1 pg — ABNORMAL HIGH (ref 26.0–34.0)
MCHC: 35.6 g/dL (ref 30.0–36.0)
MCV: 95.9 fL (ref 78.0–100.0)
PLATELETS: 81 10*3/uL — AB (ref 150–400)
RBC: 2.96 MIL/uL — AB (ref 3.87–5.11)
RDW: 18.6 % — ABNORMAL HIGH (ref 11.5–15.5)
WBC: 14.5 10*3/uL — AB (ref 4.0–10.5)

## 2016-09-11 LAB — URINE CULTURE: Culture: NO GROWTH

## 2016-09-11 MED ORDER — METRONIDAZOLE IN NACL 5-0.79 MG/ML-% IV SOLN
500.0000 mg | Freq: Three times a day (TID) | INTRAVENOUS | Status: DC
  Administered 2016-09-11 – 2016-09-21 (×29): 500 mg via INTRAVENOUS
  Filled 2016-09-11 (×31): qty 100

## 2016-09-11 MED ORDER — DEXTROSE 5 % IV SOLN
1.0000 g | INTRAVENOUS | Status: DC
  Administered 2016-09-11: 1 g via INTRAVENOUS
  Filled 2016-09-11 (×2): qty 1

## 2016-09-11 MED ORDER — METOPROLOL TARTRATE 12.5 MG HALF TABLET
12.5000 mg | ORAL_TABLET | Freq: Two times a day (BID) | ORAL | Status: DC
  Administered 2016-09-11 – 2016-10-02 (×40): 12.5 mg via ORAL
  Filled 2016-09-11 (×42): qty 1

## 2016-09-11 MED ORDER — INSULIN ASPART 100 UNIT/ML ~~LOC~~ SOLN
0.0000 [IU] | Freq: Three times a day (TID) | SUBCUTANEOUS | Status: DC
  Administered 2016-09-14 – 2016-09-26 (×7): 1 [IU] via SUBCUTANEOUS
  Administered 2016-09-26: 2 [IU] via SUBCUTANEOUS
  Administered 2016-09-27 – 2016-09-29 (×7): 1 [IU] via SUBCUTANEOUS
  Administered 2016-09-29: 2 [IU] via SUBCUTANEOUS
  Administered 2016-09-30: 1 [IU] via SUBCUTANEOUS

## 2016-09-11 MED ORDER — BOOST / RESOURCE BREEZE PO LIQD
1.0000 | Freq: Three times a day (TID) | ORAL | Status: DC
  Administered 2016-09-11 – 2016-09-14 (×5): 1 via ORAL

## 2016-09-11 MED ORDER — ALBUMIN HUMAN 5 % IV SOLN
25.0000 g | Freq: Once | INTRAVENOUS | Status: AC
Start: 2016-09-11 — End: 2016-09-11
  Administered 2016-09-11: 25 g via INTRAVENOUS
  Filled 2016-09-11: qty 500

## 2016-09-11 NOTE — Progress Notes (Signed)
Inpatient Diabetes Program Recommendations  AACE/ADA: New Consensus Statement on Inpatient Glycemic Control (2015)  Target Ranges:  Prepandial:   less than 140 mg/dL      Peak postprandial:   less than 180 mg/dL (1-2 hours)      Critically ill patients:  140 - 180 mg/dL   Results for JESLY, HARTMANN (MRN [id number]) as of 09/11/2016 07:36  Ref. Range 09/10/2016 09:49 09/10/2016 17:19 09/10/2016 20:09 09/10/2016 21:36 09/11/2016 06:23  Glucose-Capillary Latest Ref Range: 65 - 99 mg/dL 69 67 114 (H) 82 130 (H)  Results for ROSALVA, NEARY (MRN [id number]) as of 09/11/2016 07:36  Ref. Range 09/10/2016 09:49  Hemoglobin A1C Latest Ref Range: 4.8 - 5.6 % 4.7 (L)   Review of Glycemic Control  Diabetes history: DM2 Outpatient Diabetes medications: None Current orders for Inpatient glycemic control: Novolog 0-15 units TID with meals, Novolog 0-5 units QHS  Inpatient Diabetes Program Recommendations: Correction (SSI): Please decrease Novolog correction to Sensitive scale (0-9 units).   Thanks, Barnie Alderman, RN, MSN, CDE Diabetes Coordinator Inpatient Diabetes Program [phone] (Team Pager from 8am to 5pm)

## 2016-09-11 NOTE — Progress Notes (Signed)
Initial Nutrition Assessment  DOCUMENTATION CODES:   Severe malnutrition in context of chronic illness  INTERVENTION:    Boost Breeze po TID, each supplement provides 250 kcal and 9 grams of protein  NUTRITION DIAGNOSIS:   Malnutrition (severe) related to chronic illness (failure to thrive post repair aortic dissection) as evidenced by energy intake < or equal to 50% for > or equal to 1 month, percent weight loss (8% x 3 months)  GOAL:   Patient will meet greater than or equal to 90% of their needs  MONITOR:   PO intake, Supplement acceptance, Labs, Weight trends, Skin, I & O's  REASON FOR ASSESSMENT:   Consult Poor PO  ASSESSMENT:    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 She presented with weakness and met sepsis criteria with concern for enteritis. Antibiotics started. Cultures pending.  Pt known to Clinical Nutrition during recent admission (May >> July 2018). Has hx of poor PO intake, enteral and parenteral nutrition support. Pt not very conversant with this RD upon visit today.  She reports she's been eating a "light" diet. States she's lost weight, however, unable to identify amount. Labs and medications reviewed. CBG's 82-130-103.  Unable to complete Nutrition-Focused physical exam at this time.  Pt has had an 8% weight loss since May 2018. Severe for time frame. Will order Boost Breeze supplement, however, pt has a hx of refusing supplements.  Diet Order:  Diet clear liquid Room service appropriate? Yes; Fluid consistency: Thin  Skin:   Reviewed, no issues  Last BM:  8/9  Height:   Ht Readings from Last 1 Encounters:  09/10/16 _0  (1.6  m)   Weight:   Wt Readings from Last 1 Encounters:  09/11/16 166 lb 3.6 oz (75.4 kg)   Wt Readings from Last 10 Encounters:  09/11/16 166 lb 3.6 oz (75.4 kg)  09/25/2016 159 lb (72.1 kg)  08/21/16 159 lb 9.8 oz (72.4 kg)  06/10/16 181 lb (82.1 kg)  05/27/16 186 lb 6.4 oz (84.6 kg)  03/28/14 173 lb 12.8 oz (78.8 kg)  02/14/14 170 lb (77.1 kg)  12/19/11 150 lb (68 kg)  11/19/11 161 lb 8 oz (73.3 kg)  11/12/11 164 lb (74.4 kg)   Ideal Body Weight:  52.2 kg  BMI:  Body mass index is 29.45 kg/m.  Estimated Nutritional Needs:   Kcal:  2000-2200  Protein:  110-120 gm  Fluid:  2.0-2.2 L  EDUCATION NEEDS:   No education needs identified at this time  Arthur Holms, RD, LDN Pager #: [phone] After-Hours Pager #: [phone]

## 2016-09-11 NOTE — Consult Note (Addendum)
Referring Physician: Dr Gweneth Dimit[facility]  Patient name: Haley Graham MRN: [id number] DOB: [birth year] Sex: female  REASON FOR CONSULT: failure to thrive post repair aortic dissection  HPI: Haley Graham is a 61 y.o. female who is s/p complicated lengthy stay at [facility] of about 2 months.  She eventually underwent stent graft repair of a type B aortic dissection by Dr Idalia Needle 08/06/16.  She was discharged to home 08/22/16.  At that time she was eating some but overall very deconditioned. She saw Dr Prescott Gum on 8/8 and was referred to ER for dehydration.  She states she has been able to eat at home but does not really have any appetite.  She denies vomiting or diarrhea.  She does not have post prandial pain.  She says she can't remember the last time she was able to walk.  She states her legs are weak.  She denies being depressed but is not very interactive and answers most questions with her eyes closed.  She was admitted through [facility] ER on 8/8 and transferred to [facility] last evening due to her recent dissection repair in the event she needed further intervention for this.  She is currently on Levaquin Flagyl and Vanc for presumed colitis.  She is able to eat and drink but does not really feel like it.  Past Medical History:  Diagnosis Date  . Anxiety   . Arthritis   . Diabetes mellitus without complication ([place])   . Gastroparesis   . GERD (gastroesophageal reflux disease)   . Hypertension   . IBS (irritable bowel syndrome)    Past Surgical History:  Procedure Laterality Date  . ABDOMINAL HYSTERECTOMY    . AORTOGRAM N/A 07/24/2016   Procedure: [facility] AND ABDOMINAL AORTA  ANGIOGRAM;  Surgeon: Waynetta Sandy, MD;  Location: [place];  Service: Vascular;  Laterality: N/A;  . CESAREAN SECTION    . CHOLECYSTECTOMY    . COLONOSCOPY    . COLONOSCOPY N/A 06/10/2016   Procedure: COLONOSCOPY;  Surgeon: Rogene Houston, MD;  Location[facility];  Service: Endoscopy;   Laterality: N/A;  8:30  . POLYPECTOMY  06/10/2016   Procedure: POLYPECTOMY;  Surgeon: Rogene Houston, MD;  Location[facility];  Service: Endoscopy;;  multiple colon  . THORACIC AORTIC ENDOVASCULAR STENT GRAFT N/A 08/06/2016   Procedure: THORACIC AORTIC ENDOVASCULAR STENT GRAFT/Thorasic and Abdominal Angiogram, Entravascular ultrasound., Open femoral exposure,Left Brachial access, and patch angioplasty right femoral artery.;  Surgeon: Serafina Mitchell, MD;  Location: [facility] OR;  Service: Vascular;  Laterality: N/A;  . UPPER GASTROINTESTINAL ENDOSCOPY      Family History  Problem Relation Age of Onset  . Cancer Mother   . Stroke Mother   . Heart attack Mother   . Stroke Father     SOCIAL HISTORY: Social History   Social History  . Marital status: Married    Spouse name: N/A  . Number of children: N/A  . Years of education: N/A   Occupational History  . Not on file.   Social History Main Topics  . Smoking status: Current Every Day Smoker    Packs/day: 0.75    Years: 23.00    Types: Cigarettes  . Smokeless tobacco: Never Used     Comment: 5-6 cigarettes. Cut back about 3 weeks ago. Smoking since age 44.  Marland Kitchen Alcohol use No  . Drug use: No  . Sexual activity: Not on file   Other Topics Concern  .  Not on file   Social History Narrative  . No narrative on file    Allergies  Allergen Reactions  . Penicillins Rash and Other (See Comments)    PATIENT HAS HAD A PCN REACTION WITH IMMEDIATE RASH, FACIAL/TONGUE/THROAT SWELLING, SOB, OR LIGHTHEADEDNESS WITH HYPOTENSION:  #  #  #  YES  #  #  #   Has patient had a PCN reaction causing severe rash involving mucus membranes or skin necrosis: no Has patient had a PCN reaction that required hospitalization no Has patient had a PCN reaction occurring within the last 10 years:no If all of the above answers are "NO", then may proceed with Cephalosporin use.  . Ativan [Lorazepam] Other (See Comments)    Makes pt very confused and more  agitated, irritable.  . Codeine Nausea And Vomiting  . Reglan [Metoclopramide] Other (See Comments)    TACHYCARDIA    Current Facility-Administered Medications  Medication Dose Route Frequency Provider Last Rate Last Dose  . acetaminophen (TYLENOL) tablet 650 mg  650 mg Oral Q6H PRN Eugenie Filler, MD       Or  . acetaminophen (TYLENOL) suppository 650 mg  650 mg Rectal Q6H PRN Eugenie Filler, MD      . albumin human 5 % solution 25 g  25 g Intravenous Once Lovey Newcomer T, NP      . ALPRAZolam Duanne Moron) tablet 0.25-0.5 mg  0.25-0.5 mg Oral BID PRN Eugenie Filler, MD      . alum & mag hydroxide-simeth (MAALOX/MYLANTA) 200-200-20 MG/5ML suspension 30 mL  30 mL Oral BID PRN Eugenie Filler, MD      . aspirin EC tablet 81 mg  81 mg Oral Daily Vance Gather B, MD      . citalopram (CELEXA) tablet 40 mg  40 mg Oral QHS Eugenie Filler, MD   40 mg at 09/10/16 2209  . clonazePAM (KLONOPIN) tablet 1 mg  1 mg Oral QHS PRN Eugenie Filler, MD   1 mg at 09/10/16 2209  . dextrose 5 %-0.9 % sodium chloride infusion   Intravenous Continuous Lovey Newcomer T, NP 125 mL/hr at 09/10/16 2221    . fluticasone (FLONASE) 50 MCG/ACT nasal spray 2 spray  2 spray Each Nare Daily PRN Eugenie Filler, MD      . insulin aspart (novoLOG) injection 0-15 Units  0-15 Units Subcutaneous TID WC Eugenie Filler, MD      . insulin aspart (novoLOG) injection 0-5 Units  0-5 Units Subcutaneous QHS Eugenie Filler, MD   Stopped at 09/27/2016 2212  . levalbuterol (XOPENEX) nebulizer solution 0.63 mg  0.63 mg Nebulization Q2H PRN Eugenie Filler, MD      . levofloxacin [facility]) IVPB 500 mg  500 mg Intravenous Q24H Joycelyn Rua, RPH   Stopped at 09/10/16 1635  . metroNIDAZOLE (FLAGYL) IVPB 500 mg  500 mg Intravenous Q6H Absher, Julieta Bellini, RPH   Stopped at 09/11/16 0339  . mometasone-formoterol (DULERA) 100-5 MCG/ACT inhaler 2 puff  2 puff Inhalation BID Eugenie Filler, MD   2 puff at 09/10/16 1039   . ondansetron (ZOFRAN) injection 4 mg  4 mg Intravenous Q6H PRN Eugenie Filler, MD      . ondansetron [facility]) tablet 4 mg  4 mg Oral Q8H PRN Eugenie Filler, MD   4 mg at 09/28/2016 2008  . pantoprazole (PROTONIX) injection 40 mg  40 mg Intravenous Q24H Eugenie Filler, MD   40 mg  at 09/10/16 2209  . senna-docusate (Senokot-S) tablet 1 tablet  1 tablet Oral QHS PRN Eugenie Filler, MD      . sodium chloride flush (NS) 0.9 % injection 3 mL  3 mL Intravenous Q12H Eugenie Filler, MD   3 mL at 09/10/16 2210  . sodium phosphate (FLEET) 7-19 GM/118ML enema 1 enema  1 enema Rectal Once PRN Eugenie Filler, MD      . sorbitol 70 % solution 30 mL  30 mL Oral Daily PRN Eugenie Filler, MD      . tiotropium [facility]) inhalation capsule 18 mcg  18 mcg Inhalation Daily Eugenie Filler, MD   18 mcg at 09/10/16 1040  . traMADol (ULTRAM) tablet 50 mg  50 mg Oral Q6H PRN Schorr, Rhetta Mura, NP      . vancomycin (VANCOCIN) IVPB 750 mg/150 ml premix  750 mg Intravenous Q12H Polly Cobia, RPH   Stopped at 09/11/16 0625    ROS:   General:  No weight loss, Fever, +chills  HEENT: No recent headaches, no nasal bleeding, no visual changes, no sore throat  Neurologic: No dizziness, blackouts, seizures. No recent symptoms of stroke or mini- stroke. No recent episodes of slurred speech, or temporary blindness.  Cardiac: No recent episodes of chest pain/pressure, no shortness of breath at rest.  + shortness of breath with exertion.  Denies history of atrial fibrillation or irregular heartbeat  Vascular: No history of rest pain in feet.  No history of claudication.  No history of non-healing ulcer, No history of DVT   Pulmonary: No home oxygen, no productive cough, no hemoptysis,  No asthma or wheezing  Musculoskeletal:  [ ]  Arthritis, [ ]  Low back pain,  [ ]  Joint pain  Hematologic:No history of hypercoagulable state.  No history of easy bleeding.  No history of  anemia  Gastrointestinal: No hematochezia or melena,  No gastroesophageal reflux, no trouble swallowing  Urinary: [ ]  chronic Kidney disease, [ ]  on HD - [ ]  MWF or [ ]  TTHS, [ ]  Burning with urination, [ ]  Frequent urination, [ ]  Difficulty urinating;   Skin: No rashes  Psychological: No history of anxiety,  No history of depression   Physical Examination  Vitals:   09/11/16 0200 09/11/16 0300 09/11/16 0415 09/11/16 0438  BP:   115/83   Pulse: (!) 101 (!) 107 (!) 112   Resp: 20 20 17    Temp:   (!) 97.5 F (36.4 C)   TempSrc:   Oral   SpO2: 98% 98% 98%   Weight:    166 lb 3.6 oz (75.4 kg)  Height:        Body mass index is 29.45 kg/m.  General:  Alert and oriented, no acute distress HEENT: Normal Neck: No bruit or JVD Pulmonary: Clear to auscultation bilaterally Cardiac: Regular Rate and Rhythm without murmur Abdomen: Soft, non-tender, non-distended, no mass, no scars Skin: No rash Extremity Pulses:  2+ radial, brachial, femoral, dorsalis pedis, posterior tibial pulses bilaterally Musculoskeletal: No deformity or edema  Neurologic: Upper and lower extremity motor 5/5 and symmetric  DATA:  CBC    Component Value Date/Time   WBC 13.8 (H) 09/10/2016 0949   RBC 3.10 (L) 09/10/2016 0949   RBC 3.22 (L) 09/10/2016 0949   HGB 9.9 (L) 09/10/2016 0949   HCT 29.8 (L) 09/10/2016 0949   PLT 76 (L) 09/10/2016 0949   MCV 92.5 09/10/2016 0949   MCH 30.7 09/10/2016 0949   MCHC  33.2 09/10/2016 0949   RDW 17.3 (H) 09/10/2016 0949   LYMPHSABS 1.5 09/10/2016 0949   MONOABS 0.8 09/10/2016 0949   EOSABS 0.0 09/10/2016 0949   BASOSABS 0.0 09/10/2016 0949   ALBUMIN less than 1   BMET    Component Value Date/Time   NA 135 09/10/2016 0949   K 4.4 09/10/2016 0949   CL 107 09/10/2016 0949   CO2 22 09/10/2016 0949   GLUCOSE 67 09/10/2016 0949   BUN 23 (H) 09/10/2016 0949   CREATININE 1.13 (H) 09/10/2016 0949   CALCIUM 6.6 (L) 09/10/2016 0949   GFRNONAA 52 (L) 09/10/2016 0949    GFRAA 60 (L) 09/10/2016 0949   CTA chest abdomen reviewed from 8/8 thoracic stent in good position celiac and SMA fill right renal fills left renal fills but poorly, filling defect with subtotal occlusion right iliac  ABI 8/9 1.00 right 0.77 left  ASSESSMENT/PLAN:  1. Aortic dissection: no evidence of bleeding or expansion of aorta since stenting.  Patent mesenteric renals.  Right iliac filling defect probably small area of dissection from procedure but has palpable DP pulses bilaterally so no malperfusion.  Would not intervene on this in pt current debilitated state and asymptomatic.  Fluid collection right groin most likely seroma or small hematoma.  No clinical pointing signs to suggest infection  2. SEVERE protein calorie malnutrition and failure to thrive.  With her current overall all condition she is high risk for opportunistic infection and skin breakdown.  I would recommend feeding tube for nutritional support until she can improve her overall deconditioning as well as PT/OT.  Otherwise really no other suggestions at this point.  No vascular surgical intervention currently planned or indicated.  Will let Dr Trula Slade know of pt admission.   Ruta Hinds, MD [facility] Office: [phone] Pager: [phone]

## 2016-09-11 NOTE — Plan of Care (Signed)
Problem: Fluid Volume: Goal: Ability to maintain a balanced intake and output will improve Outcome: Progressing Patient and spouse educated why she got foley cath due to fluid retention and inability to void.

## 2016-09-11 NOTE — Evaluation (Signed)
Occupational Therapy Evaluation Patient Details Name: Haley Graham MRN: [id number] DOB: 09-Jan-1956 Today's Date: 09/11/2016    History of Present Illness This 61 y.o. female admitted with sepsis, hypotension, dehydration, amemia, COPD, AKI due to poor oral intake and hypotension/dehydration.  PMH  includes:  Recent prolonged hospitalization for [place] type B aortic dissection s/p descending thoracic aortic stent graft placement; anxiety, GERD, COPD,    Clinical Impression   Pt admitted with above. She demonstrates the below listed deficits and will benefit from continued OT to maximize safety and independence with BADLs.  Pt very familiar to this OT from previous admission.   Pt presents to OT with generalized weakness, impaired balance, impaired cognition.   She currently requires total A for all aspects of ADLs as she will not engage with me to perform any of these activities.  She did ambulate in hallway with +2 min A (for safety).  She will not make eye contact and interacts only when pushed to do so, and then it's mostly "yes/no"  Responses.   She is less interactive than she was previous admission.   Feel she would benefit from Psych consult.   And, will need SNF level rehab at discharge, if she will participate.       Follow Up Recommendations  SNF;Other (comment)    Equipment Recommendations  None recommended by OT    Recommendations for Other Services       Precautions / Restrictions Precautions Precautions: Fall      Mobility Bed Mobility Overal bed mobility: Needs Assistance Bed Mobility: Supine to Sit;Sit to Supine     Supine to sit: Max assist Sit to supine: Max assist   General bed mobility comments: Pt would not initiate OOB, and assisted with all aspects of movement.  Pt flopped back on the bed when returning to supine, requiring assist to lift LEs back on to bed and with positioning trunk correctly   Transfers Overall transfer level: Needs  assistance Equipment used: Rolling walker (2 wheeled)   Sit to Stand: Min assist;+2 physical assistance Stand pivot transfers: Min assist;+2 physical assistance       General transfer comment: assist to move into standing and for balance as well as walker negotiation     Balance Overall balance assessment: Needs assistance Sitting-balance support: Feet supported Sitting balance-Leahy Scale: Poor Sitting balance - Comments: Requires UE support    Standing balance support: Single extremity supported;Bilateral upper extremity supported Standing balance-Leahy Scale: Poor Standing balance comment: requires UE support                            ADL either performed or assessed with clinical judgement   ADL Overall ADL's : Needs assistance/impaired   Eating/Feeding Details (indicate cue type and reason): Pt refusing to eat.  Unsure what her physical capabilities are at this time.  SHe was able to self feed last admission, but had limited oral intake  Grooming: Wash/dry hands;Oral care;Wash/dry face;Brushing hair;Total assistance;Sitting;Bed level   Upper Body Bathing: Total assistance;Bed level   Lower Body Bathing: Total assistance;Sit to/from stand;Bed level   Upper Body Dressing : Total assistance;Sitting;Bed level   Lower Body Dressing: Total assistance;Bed level   Toilet Transfer: Minimal assistance;+2 for physical assistance;Ambulation;Comfort height toilet;Grab bars;RW   Toileting- Clothing Manipulation and Hygiene: Total assistance;Sit to/from stand       Functional mobility during ADLs: Minimal assistance;+2 for physical assistance;Rolling walker General ADL Comments: Pt refused to engage  in any ADL activiites      Vision         Perception     Praxis      Pertinent Vitals/Pain Pain Assessment: Faces Faces Pain Scale: Hurts a little bit Pain Location: did not indicate  Pain Descriptors / Indicators: Grimacing Pain Intervention(s): Monitored  during session     Hand Dominance Right   Extremity/Trunk Assessment Upper Extremity Assessment Upper Extremity Assessment: Generalized weakness;RUE deficits/detail;LUE deficits/detail RUE Deficits / Details: strength grossly 3-/5.  Significant edema noted  LUE Deficits / Details: Grossly 3-/5 edema noted            Communication Communication Communication: Other (comment) (pt minimally communicative )   Cognition Arousal/Alertness: Awake/alert Behavior During Therapy: Flat affect Overall Cognitive Status: Impaired/Different from baseline Area of Impairment: Attention;Following commands;Safety/judgement;Problem solving                   Current Attention Level: Sustained     Safety/Judgement: Decreased awareness of safety   Problem Solving: Slow processing;Decreased initiation;Difficulty sequencing;Requires verbal cues General Comments: Pt does not make eye contact.  She will only answer questions when heavily prodded.  The only spontaneous statements she said where "I'm not getting up", and "put me back to bed!"  Most other responses were in the forms of head nods or yes/no responses    General Comments  HR to 142 with activity.  Pt adamantly refused to sit in chair at end of session insisting on returning to bed despite max encouragement     Exercises     Shoulder Instructions      Home Living Family/patient expects to be discharged to:: Skilled nursing facility Living Arrangements: Spouse/significant other Available Help at Discharge: Family Type of Home: Mobile home Home Access: Stairs to enter Entrance Stairs-Number of Steps: 6 Entrance Stairs-Rails: Right Home Layout: One level     Bathroom Shower/Tub: Teacher, early years/pre: Standard     Home Equipment: None          Prior Functioning/Environment Level of Independence: Needs assistance  Gait / Transfers Assistance Needed: Per chart, pt has not been able to walk consistently since  discharge  ADL's / Homemaking Assistance Needed: Per chart, pt has required assistance with ADLs since discharge   Comments: Prior to previous admission, pt was fully independent including watching her 34 y. o. grandson         OT Problem List: Decreased strength;Decreased activity tolerance;Impaired balance (sitting and/or standing);Decreased safety awareness;Decreased cognition;Decreased knowledge of use of DME or AE;Cardiopulmonary status limiting activity;Pain      OT Treatment/Interventions: Self-care/ADL training;Therapeutic exercise;DME and/or AE instruction;Therapeutic activities;Balance training;Patient/family education    OT Goals(Current goals can be found in the care plan section) Acute Rehab OT Goals Patient Stated Goal: The only goal pt would state was "to get back into bed"   Atempted to have pt establish goals, but she would not interact.  At end of session, pt reports she plans on going to SNF, and is agreeable to working with PT/OT while in hospital  OT Goal Formulation: With patient Time For Goal Achievement: 09/25/16 Potential to Achieve Goals: Fair ADL Goals Pt Will Perform Grooming: with min assist;standing Pt Will Perform Upper Body Bathing: with min assist;sitting Pt Will Perform Lower Body Bathing: with min assist;sit to/from stand Pt Will Transfer to Toilet: with min guard assist;ambulating;regular height toilet;bedside commode;grab bars Pt Will Perform Toileting - Clothing Manipulation and hygiene: with min guard assist;sit to/from stand  OT Frequency: Min 2X/week   Barriers to D/C: Decreased caregiver support  spouse can't provide necessary level of care        Co-evaluation PT/OT/SLP Co-Evaluation/Treatment: Yes Reason for Co-Treatment: For patient/therapist safety;To address functional/ADL transfers   OT goals addressed during session: ADL's and self-care;Strengthening/ROM      AM-PAC PT "6 Clicks" Daily Activity     Outcome Measure Help from  another person eating meals?: A Lot Help from another person taking care of personal grooming?: Total Help from another person toileting, which includes using toliet, bedpan, or urinal?: Total Help from another person bathing (including washing, rinsing, drying)?: Total Help from another person to put on and taking off regular upper body clothing?: Total Help from another person to put on and taking off regular lower body clothing?: Total 6 Click Score: 7   End of Session Equipment Utilized During Treatment: Rolling walker Nurse Communication: Mobility status  Activity Tolerance: Patient limited by lethargy;Other (comment) (self limiting behaviors ) Patient left: in bed;with call bell/phone within reach  OT Visit Diagnosis: Unsteadiness on feet (R26.81)                Time: 7673-4193 OT Time Calculation (min): 38 min Charges:  OT General Charges $OT Visit: 1 Procedure OT Evaluation $OT Eval Moderate Complexity: 1 Procedure G-Codes:     Lucille Passy, OTR/L [phone]   [place], Honesty Menta M 09/11/2016, 5:20 PM

## 2016-09-11 NOTE — Care Management Note (Signed)
Case Management Note  Patient Details  Name: Haley Graham MRN: [id number] Date of Birth: 26-Jun-1955  Subjective/Objective:                 Patient discharged 7/21 to home w [facility] for [facility] services. Requested from [facility] if patient was still active, will update note as info becomes available.   Action/Plan:   Expected Discharge Date:   (unknown)               Expected Discharge Plan:  [place]  In-House Referral:     Discharge planning Services  CM Consult  Post Acute Care Choice:    Choice offered to:     DME Arranged:    DME Agency:     HH Arranged:    HH Agency:     Status of Service:  In process, will continue to follow  If discussed at Long Length of Stay Meetings, dates discussed:    Additional Comments:  Carles Collet, RN 09/11/2016, 1:50 PM

## 2016-09-11 NOTE — Evaluation (Signed)
Physical Therapy Evaluation Patient Details Name: Haley Graham MRN: [id number] DOB: 1955-08-14 Today's Date: 09/11/2016   History of Present Illness  This 61 y.o. female admitted with sepsis, hypotension, dehydration, amemia, COPD, AKI due to poor oral intake and hypotension/dehydration.  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  Pt needing mod/max assist and maximal encouragement to get any mobility from the patient.  Pt currently limited functionally due to the problems listed. ( See problems list.)   Pt will benefit from PT to maximize function and safety in order to get ready for next venue listed below.     Follow Up Recommendations SNF    Equipment Recommendations  Other (comment) (TBA)    Recommendations for Other Services       Precautions / Restrictions Precautions Precautions: Fall      Mobility  Bed Mobility Overal bed mobility: Needs Assistance Bed Mobility: Supine to Sit;Sit to Supine     Supine to sit: Max assist Sit to supine: Max assist   General bed mobility comments: Pt would not initiate OOB, and assisted with all aspects of movement.  Pt flopped back on the bed when returning to supine, requiring assist to lift LEs back on to bed and with positioning trunk correctly   Transfers Overall transfer level: Needs assistance Equipment used: Rolling walker (2 wheeled)   Sit to Stand: Min assist;+2 physical assistance Stand pivot transfers: Min assist;+2 physical assistance       General transfer comment: assist to move into standing and for balance as well as walker negotiation   Ambulation/Gait             General Gait Details: pt unable  Stairs            Wheelchair Mobility    Modified Rankin (Stroke Patients Only)       Balance Overall  balance assessment: Needs assistance Sitting-balance support: Feet supported Sitting balance-Leahy Scale: Poor Sitting balance - Comments: Requires UE support    Standing balance support: Single extremity supported;Bilateral upper extremity supported Standing balance-Leahy Scale: Poor Standing balance comment: requires UE support                              Pertinent Vitals/Pain Pain Assessment: Faces Faces Pain Scale: Hurts a little bit Pain Location: did not indicate  Pain Descriptors / Indicators: Grimacing Pain Intervention(s): Monitored during session    Home Living Family/patient expects to be discharged to:: Skilled nursing facility Living Arrangements: Spouse/significant other Available Help at Discharge: Family Type of Home: Mobile home Home Access: Stairs to enter Entrance Stairs-Rails: Right Entrance Stairs-Number of Steps: 6 Home Layout: One level Home Equipment: None      Prior Function Level of Independence: Needs assistance   Gait / Transfers Assistance Needed: Per chart, pt has not been able to walk consistently since discharge   ADL's / Homemaking Assistance Needed: Per chart, pt has required assistance with ADLs since discharge  Comments: Prior to previous admission, pt was fully independent including watching her 8 y. o. grandson      Engineer, manufacturing Dominance   Dominant Hand: Right    Extremity/Trunk Assessment   Upper Extremity Assessment Upper Extremity Assessment: Generalized weakness;RUE deficits/detail;LUE deficits/detail RUE Deficits / Details: strength grossly 3-/5.  Significant edema noted  LUE Deficits /  Details: Grossly 3-/5 edema noted     Lower Extremity Assessment Lower Extremity Assessment: Generalized weakness    Cervical / Trunk Assessment Cervical / Trunk Assessment: Kyphotic  Communication   Communication: Other (comment) (pt minimally communicative )  Cognition Arousal/Alertness: Awake/alert Behavior During Therapy:  Flat affect Overall Cognitive Status: Impaired/Different from baseline Area of Impairment: Attention;Following commands;Safety/judgement;Problem solving                   Current Attention Level: Sustained     Safety/Judgement: Decreased awareness of safety   Problem Solving: Slow processing;Decreased initiation;Difficulty sequencing;Requires verbal cues General Comments: Pt does not make eye contact.  She will only answer questions when heavily prodded.  The only spontaneous statements she said where "I'm not getting up", and "put me back to bed!"  Most other responses were in the forms of head nods or yes/no responses       General Comments General comments (skin integrity, edema, etc.): HR up as high as 142, but sustained in the 133's while sitting.  Pt adamantly refusing to sit up in the chair despite maximal encouragement    Exercises     Assessment/Plan    PT Assessment Patient needs continued PT services  PT Problem List Decreased strength;Decreased activity tolerance;Decreased balance;Decreased mobility;Decreased knowledge of use of DME;Decreased safety awareness       PT Treatment Interventions DME instruction;Gait training;Functional mobility training;Therapeutic activities;Therapeutic exercise;Patient/family education    PT Goals (Current goals can be found in the Care Plan section)  Acute Rehab PT Goals Patient Stated Goal: The only goal pt would state was "to get back into bed"   Atempted to have pt establish goals, but she would not interact.  At end of session, pt reports she plans on going to SNF, and is agreeable to working with PT/OT while in hospital  PT Goal Formulation: With patient Time For Goal Achievement: 09/25/16 Potential to Achieve Goals: Fair    Frequency Min 3X/week   Barriers to discharge        Co-evaluation PT/OT/SLP Co-Evaluation/Treatment: Yes Reason for Co-Treatment: For patient/therapist safety PT goals addressed during session:  Mobility/safety with mobility OT goals addressed during session: ADL's and self-care;Strengthening/ROM       AM-PAC PT "6 Clicks" Daily Activity  Outcome Measure Difficulty turning over in bed (including adjusting bedclothes, sheets and blankets)?: Total Difficulty moving from lying on back to sitting on the side of the bed? : Total Difficulty sitting down on and standing up from a chair with arms (e.g., wheelchair, bedside commode, etc,.)?: Total Help needed moving to and from a bed to chair (including a wheelchair)?: A Little Help needed walking in hospital room?: A Lot Help needed climbing 3-5 steps with a railing? : A Lot 6 Click Score: 10    End of Session     Patient left: in bed;with call bell/phone within reach;with bed alarm set Nurse Communication: Mobility status PT Visit Diagnosis: Muscle weakness (generalized) (M62.81);Other abnormalities of gait and mobility (R26.89);Unsteadiness on feet (R26.81);Adult, failure to thrive (R62.7)    Time: 2694-8546 PT Time Calculation (min) (ACUTE ONLY): 38 min   Charges:   PT Evaluation $PT Eval Moderate Complexity: 1 Mod     PT G Codes:        29-Sep-2016  Donnella Sham, PT [phone]  (pager)  Tessie Fass Natasa Stigall Sep 29, 2016, 5:39 PM

## 2016-09-11 NOTE — Progress Notes (Signed)
Pharmacy Antibiotic Note  Haley Graham is a 61 y.o. female currently on day #3 vancomycin, levaquin, and metronidazole for sepsis 2/2 colitis now to change levaquin to cefepime. Renal function improved with CrCl ~ 50 ml/min.  Plan: 1) Cefepime 1g IV q24 2) Continue vancomycin 750mg  IV q12, will consider trough this weekend 3) Metronidazole adjusted to 500mg  IV q8  Height: 5\' 3"  (160 cm) Weight: 166 lb 3.6 oz (75.4 kg) IBW/kg (Calculated) : 52.4  Temp (24hrs), Avg:98.1 F (36.7 C), Min:97.5 F (36.4 C), Max:98.5 F (36.9 C)   Recent Labs Lab 09/20/2016 1139 09/24/2016 1449 09/16/2016 2034 09/10/16 0558 09/10/16 0949 09/11/16 0714  WBC 21.0*  --   --   --  13.8* 14.5*  CREATININE 1.45*  --   --   --  1.13* 1.17*  LATICACIDVEN  --  3.24* 2.65* 1.3 1.3  --     Estimated Creatinine Clearance: 49.7 mL/min (A) (by C-G formula based on SCr of 1.17 mg/dL (H)).    Allergies  Allergen Reactions  . Penicillins Rash and Other (See Comments)    PATIENT HAS HAD A PCN REACTION WITH IMMEDIATE RASH, FACIAL/TONGUE/THROAT SWELLING, SOB, OR LIGHTHEADEDNESS WITH HYPOTENSION:  #  #  #  YES  #  #  #   Has patient had a PCN reaction causing severe rash involving mucus membranes or skin necrosis: no Has patient had a PCN reaction that required hospitalization no Has patient had a PCN reaction occurring within the last 10 years:no If all of the above answers are "NO", then may proceed with Cephalosporin use.  . Ativan [Lorazepam] Other (See Comments)    Makes pt very confused and more agitated, irritable.  . Codeine Nausea And Vomiting  . Reglan [Metoclopramide] Other (See Comments)    TACHYCARDIA    Antimicrobials this admission: 8/8 Flagyl>> 8/8 Levaquin >> 8/10 8/10 Cefepime >> 8/8 Vancomycin >>  Dose adjustments this admission: Vancomycin 1g q24 > 750mg  q12 Metronidazole 500mg  q6 > 500mg  q8  Microbiology results: 8/8 blood cx: ngtd 8/8 urine cx: negative, final 8/8 GI panel:  neg 8/8 Cdiff: neg/neg  Thank you for allowing pharmacy to be a part of this patient's care.  Deboraha Sprang 09/11/2016 12:34 PM

## 2016-09-11 NOTE — Progress Notes (Signed)
PROGRESS NOTE    Haley Graham  BPZ:[id number] DOB: 06/02/1955 DOA: 09/19/2016 PCP: Glenda Chroman, MD   Brief Narrative: Haley Graham is a 61 y.o. 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 She presented with weakness and met sepsis criteria with concern for enteritis. Antibiotics started. Cultures pending.   Assessment & Plan:   Principal Problem:   Sepsis ([place]) Active Problems:   GERD (gastroesophageal reflux disease)   Dissection of thoracoabdominal aorta ([facility])   AAA (abdominal aortic aneurysm) ([facility])   Benign essential HTN   Diabetes mellitus type 2 in obese (HCC)   Anxiety state   Hyponatremia   Leukocytosis   Hypotension   AKI (acute kidney injury) ([place])   Dehydration   Weakness   Anemia   Thrombocytopenia (H[facility])   Abnormal computed tomography angiography (CTA) of abdomen and pelvis   Colitis: Probable   Sepsis Secondary to colitis/enteritis. Physiology improved with IV fluids and antibiotics. Has some tachycardia. GI pathogen panel and C. Diff negative. -continue Vancomycin, Flagyl and will start cefepime (discontinue Levaquin) -blood/urine culture  Hypotension Improved with IV fluids. Troponin negative. Albumin extremely low. -monitor BP  Dehydration Poor oral intake -encourage oral intake -dietitian consulted  Normocytic anemia Stable  COPD Stable. -continue nebulizers  Acute kidney injury Secondary to hypotension. Near baseline of around 1.1.  Abnormal CT angiogram of chest/abdomen/pelvis Thrombus of left renal artery. Vascular surgery consulted with no recommendations for acute management.  Thrombocytopenia Stable. -recheck  CBC  GERD Stable. -continue PPI  Anxiety Stable. -continue Celexa, Xanax and Klonopin (home regimen)  [place] type B aortic dissection Patient is s/p stent graft placement on previous admission. -restart metoprolol at reduce dose  Tachycardia Rebound secondary to abrupt cessation of beta-blocker -restart metoprolol at reduced dose of 12.53m BID   DVT prophylaxis: SCDs Code Status: Full code Family Communication: None at bedside Disposition Plan: Discharge pending medical improvement   Consultants:   Vascular sugery  Procedures:   ABI: 1.00 (right); 0.77 (left)  Antimicrobials:  Levaquin (8/8>>8/10)  Vancomycin (8/8>>  Metronidazole (8/8>>  Cefepime (8/10>>   Subjective: Patient reports no chest pain, dyspnea. No abdominal pain.  Objective: Vitals:   09/11/16 0300 09/11/16 0415 09/11/16 0438 09/11/16 0800  BP:  115/83  121/71  Pulse: (!) 107 (!) 112  (!) 115  Resp: 20 17  (!) 22  Temp:  (!) 97.5 F (36.4 C)  98.1 F (36.7 C)  TempSrc:  Oral  Oral  SpO2: 98% 98%    Weight:   75.4 kg (166 lb 3.6 oz)   Height:        Intake/Output Summary (Last 24 hours) at 09/11/16 0935 Last data filed at 09/11/16 0332  Gross per 24 hour  Intake              200 ml  Output              805 ml  Net             -605 ml   Filed Weights   09/11/16 0438  Weight: 75.4 kg (166 lb 3.6 oz)    Examination:  General exam: Appears calm and comfortable Respiratory system: Clear to auscultation. Respiratory effort normal. Cardiovascular system: S1 & S2 heard, tachycardia, regular rhythm. Gastrointestinal system: Abdomen is  nondistended, soft and nontender. Normal bowel sounds heard. Central nervous system: Alert and oriented. No focal neurological deficits. Extremities: 2+edema. No calf tenderness Skin: No cyanosis. No rashes Psychiatry: Judgement and insight appear normal. Mood & affect flat and depressed. withdrawn.     Data Reviewed: I have personally reviewed  following labs and imaging studies  CBC:  Recent Labs Lab 09/10/2016 1139 09/10/16 0949 09/11/16 0714  WBC 21.0* 13.8* 14.5*  NEUTROABS 18.2* 11.4*  --   HGB 10.1* 9.9* 10.1*  HCT 30.3* 29.8* 28.4*  MCV 94.4 92.5 95.9  PLT 84* 76* 81*   Basic Metabolic Panel:  Recent Labs Lab 09/26/2016 1139 09/10/16 0949 09/11/16 0714  NA 130* 135 134*  K 4.9 4.4 4.8  CL 99* 107 108  CO2 24 22 19*  GLUCOSE 161* 67 110*  BUN 28* 23* 21*  CREATININE 1.45* 1.13* 1.17*  CALCIUM 7.0* 6.6* 6.9*  MG  --  1.5*  --    GFR: Estimated Creatinine Clearance: 49.7 mL/min (A) (by C-G formula based on SCr of 1.17 mg/dL (H)). Liver Function Tests:  Recent Labs Lab 09/22/2016 1139 09/10/16 0949  AST 21 19  ALT 15 15  ALKPHOS 184* 191*  BILITOT 0.6 0.8  PROT 4.7* 4.7*  ALBUMIN 1.0* <1.0*    Recent Labs Lab 09/13/2016 1139  LIPASE 14   No results for input(s): AMMONIA in the last 168 hours. Coagulation Profile:  Recent Labs Lab 09/10/16 0949  INR 1.29   Cardiac Enzymes:  Recent Labs Lab 09/03/2016 1139 09/10/16 0949  TROPONINI <0.03 <0.03   BNP (last 3 results) No results for input(s): PROBNP in the last 8760 hours. HbA1C:  Recent Labs  09/10/16 0949  HGBA1C 4.7*   CBG:  Recent Labs Lab 09/10/16 0949 09/10/16 1719 09/10/16 2009 09/10/16 2136 09/11/16 0623  GLUCAP 69 67 114* 82 130*   Lipid Profile: No results for input(s): CHOL, HDL, LDLCALC, TRIG, CHOLHDL, LDLDIRECT in the last 72 hours. Thyroid Function Tests:  Recent Labs  09/10/16 0949  TSH 1.045   Anemia Panel:  Recent Labs  09/26/2016 2011 09/10/16 0949  VITAMINB12 2,570*  --   FOLATE  --  12.8  FERRITIN 585*  --   TIBC 98*  --   IRON 36  --   RETICCTPCT  --  3.1   Sepsis Labs:  Recent Labs Lab 09/30/2016 1449 09/06/2016 2034 09/10/16 0558 09/10/16 0949  PROCALCITON  --   --   --  0.24  LATICACIDVEN 3.24* 2.65* 1.3 1.3    Recent Results (from the past 240 hour(s))  Gastrointestinal Panel  by PCR , Stool     Status: None   Collection Time: 09/21/2016  1:30 AM  Result Value Ref Range Status   Campylobacter species NOT DETECTED NOT DETECTED Final   Plesimonas shigelloides NOT DETECTED NOT DETECTED Final   Salmonella species NOT DETECTED NOT DETECTED Final   Yersinia enterocolitica NOT DETECTED NOT DETECTED Final   Vibrio species NOT DETECTED NOT DETECTED Final   Vibrio cholerae NOT DETECTED NOT DETECTED Final   Enteroaggregative E coli (EAEC) NOT DETECTED NOT DETECTED Final   Enteropathogenic E coli (EPEC) NOT DETECTED NOT DETECTED Final   Enterotoxigenic E coli (ETEC) NOT DETECTED NOT DETECTED Final   Shiga like toxin producing E coli (STEC) NOT DETECTED NOT DETECTED Final   Shigella/Enteroinvasive E coli (EIEC) NOT DETECTED NOT DETECTED Final   Cryptosporidium NOT DETECTED NOT DETECTED Final   Cyclospora cayetanensis NOT DETECTED NOT DETECTED Final  Entamoeba histolytica NOT DETECTED NOT DETECTED Final   Giardia lamblia NOT DETECTED NOT DETECTED Final   Adenovirus F40/41 NOT DETECTED NOT DETECTED Final   Astrovirus NOT DETECTED NOT DETECTED Final   Norovirus GI/GII NOT DETECTED NOT DETECTED Final   Rotavirus A NOT DETECTED NOT DETECTED Final   Sapovirus (I, II, IV, and V) NOT DETECTED NOT DETECTED Final  C difficile quick scan w PCR reflex     Status: None   Collection Time: 09/20/2016  1:30 AM  Result Value Ref Range Status   C Diff antigen NEGATIVE NEGATIVE Final   C Diff toxin NEGATIVE NEGATIVE Final   C Diff interpretation No C. difficile detected.  Final  Blood Culture (routine x 2)     Status: None (Preliminary result)   Collection Time: 09/03/2016  2:09 PM  Result Value Ref Range Status   Specimen Description BLOOD RIGHT ANTECUBITAL  Final   Special Requests   Final    BOTTLES DRAWN AEROBIC AND ANAEROBIC Blood Culture adequate volume   Culture   Final    NO GROWTH < 24 HOURS Performed at [facility], [place] [address], [place], [place] [address]    Report  Status PENDING  Incomplete  Culture, Urine     Status: None   Collection Time: 09/04/2016  9:40 PM  Result Value Ref Range Status   Specimen Description URINE, CLEAN CATCH  Final   Special Requests NONE  Final   Culture   Final    NO GROWTH Performed at [facility], [place] [address], [place],  [address]    Report Status 09/11/2016 FINAL  Final         Radiology Studies: Ct Head Wo Contrast  Result Date: 09/27/2016 CLINICAL DATA:  Altered level of consciousness EXAM: CT HEAD WITHOUT CONTRAST TECHNIQUE: Contiguous axial images were obtained from the base of the skull through the vertex without intravenous contrast. COMPARISON:  12/31/2004 FINDINGS: Brain: Mild low-density in the periventricular white matter is compatible with chronic ischemic changes of small vessel disease. There is no mass effect, midline shift, or acute hemorrhage. Vascular: No hyperdense vessel or unexpected calcification. Skull: Intact. Sinuses/Orbits: Small amount of fluid in the mastoid air cells. Visualized paranasal sinuses are clear. Orbits are unremarkable. Other: Noncontributory. IMPRESSION: No acute intracranial pathology.  Chronic ischemic changes. Electronically Signed   By: Marybelle Killings M.D.   On: 10/01/2016 14:32   Ct Angio Chest/abd/pel For Dissection W And/or Wo Contrast  Result Date: 09/05/2016 CLINICAL DATA:  Thoracic aortic dissection. EXAM: CT ANGIOGRAPHY CHEST, ABDOMEN AND PELVIS TECHNIQUE: Multidetector CT imaging through the chest, abdomen and pelvis was performed using the standard protocol during bolus administration of intravenous contrast. Multiplanar reconstructed images and MIPs were obtained and reviewed to evaluate the vascular anatomy. CONTRAST:  80 mL of Isovue 370 intravenously. COMPARISON:  CT scan August 05, 2016. FINDINGS: CTA CHEST FINDINGS Cardiovascular: Interval placement of stent graft for treatment of type B thoracic aortic dissection. The stent extends proximally from the  origin of the right innominate artery into the proximal descending thoracic aorta. Normal contrast filling of the great vessels is noted, with continued presence of moderate stenosis involving the origin of the left subclavian artery. Dissection is seen to extend throughout the descending thoracic aorta into the abdominal aorta. No pericardial effusion is noted. Normal cardiac size. Mediastinum/Nodes: No enlarged mediastinal, hilar, or axillary lymph nodes. Thyroid gland, trachea, and esophagus demonstrate no significant findings. Lungs/Pleura: No pneumothorax or pleural effusion  is noted. Mild bilateral posterior basilar subsegmental atelectasis is noted. Musculoskeletal: No chest wall abnormality. No acute or significant osseous findings. Review of the MIP images confirms the above findings. CTA ABDOMEN AND PELVIS FINDINGS VASCULAR Aorta: Dissection from thoracic aorta is noted to extend into abdominal aorta, although once again the true lumen is noted to be thrombosed starting at the level of the left renal artery. Dissection flap extends to aortic bifurcation and into right common iliac artery. Celiac: Patent without evidence of aneurysm, dissection, vasculitis or significant stenosis. SMA: Patent without evidence of aneurysm, dissection, vasculitis or significant stenosis. Renals: Right renal artery arises from false lumen and appears to be widely patent without stenosis. Left renal artery origin is seen overlying dissection flap and is thrombosed as noted on prior exam. IMA: Patent without evidence of aneurysm, dissection, vasculitis or significant stenosis. Inflow: As noted previously, thrombosed dissection flap is seen extending into right common iliac artery. There is interval development of linear filling defect extending throughout proximal portion of right external iliac artery with associated calcifications which may represent further extension of dissection, thrombosis or a combination thereof. Veins:  No obvious venous abnormality within the limitations of this arterial phase study. Review of the MIP images confirms the above findings. NON-VASCULAR Hepatobiliary: Status post cholecystectomy. Fatty infiltration of the liver. Pancreas: Unremarkable. No pancreatic ductal dilatation or surrounding inflammatory changes. Spleen: Normal in size without focal abnormality. Adrenals/Urinary Tract: Stable left adrenal nodule. Right adrenal gland appears normal. Progressive atrophy of left kidney is noted consistent with previously described thrombosed left renal artery. Right kidney and ureter appear normal. Urinary bladder is unremarkable. Stomach/Bowel: The stomach is unremarkable. The appendix appears normal. There is no evidence of bowel obstruction. There is noted wall enhancement of several loops of small bowel and the colon which may represent inflammation. Lymphatic: No significant adenopathy is noted. Reproductive: Status post hysterectomy. No adnexal masses. Other: No abdominal wall hernia or abnormality. No abdominopelvic ascites. Fluid collection is seen in the right inguinal region consistent with recent stent graft placement. Musculoskeletal: No acute or significant osseous findings. Review of the MIP images confirms the above findings. IMPRESSION: Interval placement of stent graft in transverse aortic arch and proximal descending thoracic aorta for treatment of type B thoracic aortic dissection. Descending thoracic aorta is seen to extend into the abdominal aorta, with stable thrombosis of the true lumen below the left renal artery. Continued thrombosis of left renal artery is noted with progressive atrophy of the left kidney as a result. Fatty infiltration of the liver is noted. There is noted wall enhancement of several loops of small bowel as well as the large bowel which may represent colitis and/or enteritis. No pneumatosis is seen at this time. There is noted interval development of linear filling  defect in the right external iliac artery proximally which may represent thrombus, dissection or combination thereof. Potentially this may be related to stent graft placement. Critical Value/emergent results were called by telephone at the time of interpretation on 09/25/2016 at 2:59 pm to Dr. Darl Householder, who verbally acknowledged these results. Electronically Signed   By: Marijo Conception, M.D.   On: 09/17/2016 15:01        Scheduled Meds: . aspirin EC  81 mg Oral Daily  . citalopram  40 mg Oral QHS  . insulin aspart  0-15 Units Subcutaneous TID WC  . insulin aspart  0-5 Units Subcutaneous QHS  . mometasone-formoterol  2 puff Inhalation BID  . pantoprazole (PROTONIX) IV  40 mg Intravenous Q24H  . sodium chloride flush  3 mL Intravenous Q12H  . tiotropium  18 mcg Inhalation Daily   Continuous Infusions: . albumin human    . dextrose 5 % and 0.9% NaCl 125 mL/hr at 09/10/16 2221  . levofloxacin (LEVAQUIN) IV Stopped (09/10/16 1635)  . metronidazole    . vancomycin Stopped (09/11/16 0223)     LOS: 2 days     Cordelia Poche, MD [facility] 09/11/2016, 9:35 AM Pager: [phone]  If 7PM-7AM, please contact night-coverage [email or web address] Password [email or web address] 09/11/2016, 9:35 AM

## 2016-09-11 NOTE — Progress Notes (Signed)
Nurse paged on call physician concerning increased generalized swelling and urine retention. Order received to stop fluid and insert foley catheter for retention.

## 2016-09-11 NOTE — Progress Notes (Signed)
Patient unable to void for 7 hours while on continuous IV fluid. Bladder scan volume 815. On call physician notified. Order to  straight cath patient received. Patient is edematous and oozing fluid from bilateral arms.

## 2016-09-12 DIAGNOSIS — E43 Unspecified severe protein-calorie malnutrition: Secondary | ICD-10-CM | POA: Insufficient documentation

## 2016-09-12 LAB — URINALYSIS, ROUTINE W REFLEX MICROSCOPIC
Glucose, UA: NEGATIVE mg/dL
Ketones, ur: 15 mg/dL — AB
Nitrite: POSITIVE — AB
Protein, ur: 100 mg/dL — AB
SPECIFIC GRAVITY, URINE: 1.025 (ref 1.005–1.030)
pH: 6.5 (ref 5.0–8.0)

## 2016-09-12 LAB — GLUCOSE, CAPILLARY
GLUCOSE-CAPILLARY: 81 mg/dL (ref 65–99)
Glucose-Capillary: 79 mg/dL (ref 65–99)
Glucose-Capillary: 94 mg/dL (ref 65–99)
Glucose-Capillary: 82 mg/dL (ref 65–99)

## 2016-09-12 LAB — CREATININE, URINE, RANDOM: CREATININE, URINE: 212.49 mg/dL

## 2016-09-12 LAB — BASIC METABOLIC PANEL
Anion gap: 5 (ref 5–15)
BUN: 17 mg/dL (ref 6–20)
CHLORIDE: 108 mmol/L (ref 101–111)
CO2: 20 mmol/L — AB (ref 22–32)
CREATININE: 1.13 mg/dL — AB (ref 0.44–1.00)
Calcium: 7 mg/dL — ABNORMAL LOW (ref 8.9–10.3)
GFR calc Af Amer: 60 mL/min — ABNORMAL LOW (ref >60)
GFR calc non Af Amer: 52 mL/min — ABNORMAL LOW (ref >60)
Glucose, Bld: 104 mg/dL — ABNORMAL HIGH (ref 65–99)
Potassium: 3.8 mmol/L (ref 3.5–5.1)
Sodium: 133 mmol/L — ABNORMAL LOW (ref 135–145)

## 2016-09-12 LAB — OSMOLALITY, URINE: Osmolality, Ur: 751 mOsm/kg (ref 300–900)

## 2016-09-12 LAB — BILIRUBIN, FRACTIONATED(TOT/DIR/INDIR)
BILIRUBIN INDIRECT: 0.6 mg/dL (ref 0.3–0.9)
Bilirubin, Direct: 0.2 mg/dL (ref 0.1–0.5)
Total Bilirubin: 0.8 mg/dL (ref 0.3–1.2)

## 2016-09-12 LAB — CBC
HEMATOCRIT: 26.1 % — AB (ref 36.0–46.0)
Hemoglobin: 8.8 g/dL — ABNORMAL LOW (ref 12.0–15.0)
MCH: 33 pg (ref 26.0–34.0)
MCHC: 33.7 g/dL (ref 30.0–36.0)
MCV: 97.8 fL (ref 78.0–100.0)
Platelets: 62 10*3/uL — ABNORMAL LOW (ref 150–400)
RBC: 2.67 MIL/uL — ABNORMAL LOW (ref 3.87–5.11)
RDW: 19.1 % — AB (ref 11.5–15.5)
WBC: 14.2 10*3/uL — ABNORMAL HIGH (ref 4.0–10.5)

## 2016-09-12 LAB — LACTATE DEHYDROGENASE: LDH: 188 U/L (ref 98–192)

## 2016-09-12 LAB — URINALYSIS, MICROSCOPIC (REFLEX)

## 2016-09-12 LAB — SODIUM, URINE, RANDOM: Sodium, Ur: <10 mmol/L

## 2016-09-12 LAB — OSMOLALITY: OSMOLALITY: 279 mosm/kg (ref 275–295)

## 2016-09-12 MED ORDER — DEXTROSE 5 % IV SOLN
1.0000 g | Freq: Three times a day (TID) | INTRAVENOUS | Status: DC
  Administered 2016-09-12 – 2016-09-17 (×15): 1 g via INTRAVENOUS
  Filled 2016-09-12 (×17): qty 1

## 2016-09-12 NOTE — Progress Notes (Signed)
PROGRESS NOTE    Haley Graham  NWG:[id number] DOB: [birth year] DOA: 09/29/2016 PCP: Glenda Chroman, MD   Brief Narrative: Haley Graham is a 61 y.o. 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 She presented with weakness and met sepsis criteria with concern for enteritis. Antibiotics started. Cultures pending.   Assessment & Plan:   Principal Problem:   Sepsis ([place]) Active Problems:   GERD (gastroesophageal reflux disease)   Dissection of thoracoabdominal aorta (HCC)   AAA (abdominal aortic aneurysm) (HCC)   Benign essential HTN   Diabetes mellitus type 2 in obese (HCC)   Anxiety state   Hyponatremia   Leukocytosis   Hypotension   AKI (acute kidney injury) ([place])   Dehydration   Weakness   Anemia   Thrombocytopenia (HCC)   Abnormal computed tomography angiography (CTA) of abdomen and pelvis   Colitis: Probable   Protein-calorie malnutrition, severe   Sepsis Secondary to colitis/enteritis. Physiology improved with IV fluids and antibiotics. Has some tachycardia. GI pathogen panel and C. Diff negative.  -continue Flagyl and Cefepime -discontinue Vancomycin -blood/urine culture  Hypotension Improved with IV fluids. Troponin negative. Albumin extremely low. -monitor BP  Dehydration Poor oral intake -encourage oral intake -dietitian consulted  Normocytic anemia Slight decrease likely dilutional -repeat CBC  COPD Stable. -continue nebulizers  Acute kidney injury Secondary to hypotension. Near baseline of around 1.1.  Oliguria Unknown if secondary to inconsistent in/out or not. Kidney function appears to be good. Patient already with a foley. -strict  in/out -urine sodium/creatinine  Hyponatremia Mild -urine osmolality/sodium -serum osmolality  Abnormal CT angiogram of chest/abdomen/pelvis Thrombus of left renal artery. Vascular surgery consulted with no recommendations for acute management.  Thrombocytopenia Worsening. Unknown cause. Associated anemia. Possibly secondary to significant fluid administration -haptoglobin, lactate dehydrogenase, bilirubin -recheck CBC  GERD Stable. -continue PPI  Anxiety Stable. -continue Celexa, and Klonopin prn -discontinue Xanax  [place] type B aortic dissection Patient is s/p stent graft placement on previous admission. -restart metoprolol at reduce dose  Tachycardia Rebound secondary to abrupt cessation of beta-blocker -restart metoprolol at reduced dose of 12.39m BID  Edema Secondary to IV fluids. Recent echo significant for grade 1 diastolic dysfunction. -repeat echocardiogram   DVT prophylaxis: SCDs Code Status: Full code Family Communication: None at bedside Disposition Plan: Discharge pending medical improvement   Consultants:   Vascular sugery  Procedures:   ABI: 1.00 (right); 0.77 (left)  Antimicrobials:  Levaquin (8/8>>8/10)  Vancomycin (8/8>>  Metronidazole (8/8>>  Cefepime (8/10>>   Subjective: Decreased appetite. Decreased urine output  Objective: Vitals:   09/12/16 0032 09/12/16 0400 09/12/16 0631 09/12/16 1044  BP: 136/69 (!) 121/59    Pulse: 84 83  91  Resp: 16 17    Temp: 97.9 F (36.6 C) 98.1 F (36.7 C)    TempSrc: Oral Oral    SpO2: 97% 98%    Weight:   85.4 kg (188 lb 4.4 oz)   Height:        Intake/Output Summary (Last 24 hours) at 09/12/16 1139 Last data filed at 09/12/16 0513  Gross per 24 hour  Intake              470 ml  Output              100 ml  Net  370 ml   Filed Weights   09/11/16 0438 09/12/16 0631  Weight: 75.4 kg (166 lb 3.6 oz) 85.4 kg (188 lb 4.4 oz)    Examination:  General exam: Calm  and comfortable. Respiratory system: Clear to auscultation bilaterally. Unlabored work of breathing. No wheezing or rales. Cardiovascular system: S1 & S2 heard, tachycardia, regular rhythm. Gastrointestinal system: Soft, mildly tender in epigastrum, non-distended, no guarding, no rebound Central nervous system: Alert, oriented. Extremities: 2+ edema in feet bilaterally. No calf tenderness Skin: No cyanosis. No rashes Psychiatry: Judgement and insight appear normal. Mood & affect flat and depressed. withdrawn.     Data Reviewed: I have personally reviewed following labs and imaging studies  CBC:  Recent Labs Lab 09/29/2016 1139 09/10/16 0949 09/11/16 0714 09/12/16 0707  WBC 21.0* 13.8* 14.5* 14.2*  NEUTROABS 18.2* 11.4*  --   --   HGB 10.1* 9.9* 10.1* 8.8*  HCT 30.3* 29.8* 28.4* 26.1*  MCV 94.4 92.5 95.9 97.8  PLT 84* 76* 81* 62*   Basic Metabolic Panel:  Recent Labs Lab 09/14/2016 1139 09/10/16 0949 09/11/16 0714 09/12/16 0707  NA 130* 135 134* 133*  K 4.9 4.4 4.8 3.8  CL 99* 107 108 108  CO2 24 22 19* 20*  GLUCOSE 161* 67 110* 104*  BUN 28* 23* 21* 17  CREATININE 1.45* 1.13* 1.17* 1.13*  CALCIUM 7.0* 6.6* 6.9* 7.0*  MG  --  1.5*  --   --    GFR: Estimated Creatinine Clearance: 54.8 mL/min (A) (by C-G formula based on SCr of 1.13 mg/dL (H)). Liver Function Tests:  Recent Labs Lab 09/23/2016 1139 09/10/16 0949  AST 21 19  ALT 15 15  ALKPHOS 184* 191*  BILITOT 0.6 0.8  PROT 4.7* 4.7*  ALBUMIN 1.0* <1.0*    Recent Labs Lab 09/24/2016 1139  LIPASE 14   No results for input(s): AMMONIA in the last 168 hours. Coagulation Profile:  Recent Labs Lab 09/10/16 0949  INR 1.29   Cardiac Enzymes:  Recent Labs Lab 09/03/2016 1139 09/10/16 0949  TROPONINI <0.03 <0.03   BNP (last 3 results) No results for input(s): PROBNP in the last 8760 hours. HbA1C:  Recent Labs  09/10/16 0949  HGBA1C 4.7*   CBG:  Recent Labs Lab 09/11/16 0623 09/11/16 1121  09/11/16 1704 09/11/16 2103 09/12/16 0616  GLUCAP 130* 103* 119* 113* 79   Lipid Profile: No results for input(s): CHOL, HDL, LDLCALC, TRIG, CHOLHDL, LDLDIRECT in the last 72 hours. Thyroid Function Tests:  Recent Labs  09/10/16 0949  TSH 1.045   Anemia Panel:  Recent Labs  10/01/2016 2011 09/10/16 0949  VITAMINB12 2,570*  --   FOLATE  --  12.8  FERRITIN 585*  --   TIBC 98*  --   IRON 36  --   RETICCTPCT  --  3.1   Sepsis Labs:  Recent Labs Lab 09/23/2016 1449 09/26/2016 2034 09/10/16 0558 09/10/16 0949  PROCALCITON  --   --   --  0.24  LATICACIDVEN 3.24* 2.65* 1.3 1.3    Recent Results (from the past 240 hour(s))  Gastrointestinal Panel by PCR , Stool     Status: None   Collection Time: 09/11/2016  1:30 AM  Result Value Ref Range Status   Campylobacter species NOT DETECTED NOT DETECTED Final   Plesimonas shigelloides NOT DETECTED NOT DETECTED Final   Salmonella species NOT DETECTED NOT DETECTED Final   Yersinia enterocolitica NOT DETECTED NOT DETECTED Final   Vibrio species NOT DETECTED NOT DETECTED Final  Vibrio cholerae NOT DETECTED NOT DETECTED Final   Enteroaggregative E coli (EAEC) NOT DETECTED NOT DETECTED Final   Enteropathogenic E coli (EPEC) NOT DETECTED NOT DETECTED Final   Enterotoxigenic E coli (ETEC) NOT DETECTED NOT DETECTED Final   Shiga like toxin producing E coli (STEC) NOT DETECTED NOT DETECTED Final   Shigella/Enteroinvasive E coli (EIEC) NOT DETECTED NOT DETECTED Final   Cryptosporidium NOT DETECTED NOT DETECTED Final   Cyclospora cayetanensis NOT DETECTED NOT DETECTED Final   Entamoeba histolytica NOT DETECTED NOT DETECTED Final   Giardia lamblia NOT DETECTED NOT DETECTED Final   Adenovirus F40/41 NOT DETECTED NOT DETECTED Final   Astrovirus NOT DETECTED NOT DETECTED Final   Norovirus GI/GII NOT DETECTED NOT DETECTED Final   Rotavirus A NOT DETECTED NOT DETECTED Final   Sapovirus (I, II, IV, and V) NOT DETECTED NOT DETECTED Final  C  difficile quick scan w PCR reflex     Status: None   Collection Time: 09/02/2016  1:30 AM  Result Value Ref Range Status   C Diff antigen NEGATIVE NEGATIVE Final   C Diff toxin NEGATIVE NEGATIVE Final   C Diff interpretation No C. difficile detected.  Final  Blood Culture (routine x 2)     Status: None (Preliminary result)   Collection Time: 09/22/2016  2:09 PM  Result Value Ref Range Status   Specimen Description BLOOD RIGHT ANTECUBITAL  Final   Special Requests   Final    BOTTLES DRAWN AEROBIC AND ANAEROBIC Blood Culture adequate volume   Culture   Final    NO GROWTH 3 DAYS Performed at [facility], [address], [place], [place] [address]    Report Status PENDING  Incomplete  Culture, Urine     Status: None   Collection Time: 09/15/2016  9:40 PM  Result Value Ref Range Status   Specimen Description URINE, CLEAN CATCH  Final   Special Requests NONE  Final   Culture   Final    NO GROWTH Performed at [facility], [place] [address], [place], [place] [address]    Report Status 09/11/2016 FINAL  Final         Radiology Studies: No results found.      Scheduled Meds: . aspirin EC  81 mg Oral Daily  . citalopram  40 mg Oral QHS  . feeding supplement  1 Container Oral TID BM  . insulin aspart  0-5 Units Subcutaneous QHS  . insulin aspart  0-9 Units Subcutaneous TID WC  . metoprolol tartrate  12.5 mg Oral BID  . pantoprazole (PROTONIX) IV  40 mg Intravenous Q24H  . sodium chloride flush  3 mL Intravenous Q12H  . tiotropium  18 mcg Inhalation Daily   Continuous Infusions: . ceFEPime (MAXIPIME) IV    . metronidazole Stopped (09/12/16 0949)  . vancomycin 750 mg (09/12/16 0034)     LOS: 3 days     Cordelia Poche, MD [facility] 09/12/2016, 11:39 AM Pager: [phone]  If 7PM-7AM, please contact night-coverage [email or web address] Password [facility] 09/12/2016, 11:39 AM

## 2016-09-12 NOTE — Progress Notes (Signed)
Patient continues to have very little interaction with staff or family. Keeps eyes closed mostly. Does not want to eat, drink or get out of the bed. Explained to patient that it is important to get out of the bed and start moving so that her strength will return. Offered to place a spiritual consult. Patient refused. Will continue to monitor.

## 2016-09-13 DIAGNOSIS — N280 Ischemia and infarction of kidney: Secondary | ICD-10-CM

## 2016-09-13 LAB — GLUCOSE, CAPILLARY
GLUCOSE-CAPILLARY: 98 mg/dL (ref 65–99)
GLUCOSE-CAPILLARY: 94 mg/dL (ref 65–99)
GLUCOSE-CAPILLARY: 115 mg/dL — AB (ref 65–99)
Glucose-Capillary: 71 mg/dL (ref 65–99)
Glucose-Capillary: 101 mg/dL — ABNORMAL HIGH (ref 65–99)

## 2016-09-13 LAB — CK: CK TOTAL: 13 U/L — AB (ref 38–234)

## 2016-09-13 LAB — COMPREHENSIVE METABOLIC PANEL
ALT: 13 U/L — ABNORMAL LOW (ref 14–54)
ANION GAP: 6 (ref 5–15)
AST: 25 U/L (ref 15–41)
Albumin: 1.3 g/dL — ABNORMAL LOW (ref 3.5–5.0)
Alkaline Phosphatase: 270 U/L — ABNORMAL HIGH (ref 38–126)
BUN: 17 mg/dL (ref 6–20)
CHLORIDE: 108 mmol/L (ref 101–111)
CO2: 19 mmol/L — AB (ref 22–32)
Calcium: 7 mg/dL — ABNORMAL LOW (ref 8.9–10.3)
Creatinine, Ser: 1.27 mg/dL — ABNORMAL HIGH (ref 0.44–1.00)
GFR calc non Af Amer: 45 mL/min — ABNORMAL LOW (ref >60)
GFR, EST AFRICAN AMERICAN: 52 mL/min — AB (ref >60)
Glucose, Bld: 126 mg/dL — ABNORMAL HIGH (ref 65–99)
Potassium: 3.9 mmol/L (ref 3.5–5.1)
SODIUM: 133 mmol/L — AB (ref 135–145)
Total Bilirubin: 0.9 mg/dL (ref 0.3–1.2)
Total Protein: 4.9 g/dL — ABNORMAL LOW (ref 6.5–8.1)

## 2016-09-13 LAB — CBC
HCT: 29.9 % — ABNORMAL LOW (ref 36.0–46.0)
Hemoglobin: 9.7 g/dL — ABNORMAL LOW (ref 12.0–15.0)
MCH: 29.1 pg (ref 26.0–34.0)
MCHC: 32.4 g/dL (ref 30.0–36.0)
MCV: 89.8 fL (ref 78.0–100.0)
PLATELETS: 89 10*3/uL — AB (ref 150–400)
RBC: 3.33 MIL/uL — AB (ref 3.87–5.11)
RDW: 17.6 % — AB (ref 11.5–15.5)
WBC: 16.9 10*3/uL — ABNORMAL HIGH (ref 4.0–10.5)

## 2016-09-13 LAB — HAPTOGLOBIN: Haptoglobin: 65 mg/dL (ref 34–200)

## 2016-09-13 MED ORDER — LOPERAMIDE HCL 2 MG PO CAPS
4.0000 mg | ORAL_CAPSULE | ORAL | Status: DC | PRN
  Administered 2016-09-13 – 2016-10-07 (×2): 4 mg via ORAL
  Filled 2016-09-13 (×2): qty 2

## 2016-09-13 NOTE — Progress Notes (Signed)
PROGRESS NOTE    HEAVEN MEEKER  XTA:[id number] DOB: 27-Dec-1955 DOA: 09/06/2016 PCP: Glenda Chroman, MD   Brief Narrative: KHLOEE GARZA is a 61 y.o. 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 She presented with weakness and met sepsis criteria with concern for enteritis. Antibiotics started. Cultures pending.   Assessment & Plan:   Principal Problem:   Sepsis ([place]) Active Problems:   GERD (gastroesophageal reflux disease)   Dissection of thoracoabdominal aorta (HCC)   AAA (abdominal aortic aneurysm) (HCC)   Benign essential HTN   Diabetes mellitus type 2 in obese (HCC)   Anxiety state   Hyponatremia   Leukocytosis   Hypotension   AKI (acute kidney injury) ([place])   Dehydration   Weakness   Anemia   Thrombocytopenia (HCC)   Abnormal computed tomography angiography (CTA) of abdomen and pelvis   Colitis: Probable   Protein-calorie malnutrition, severe   Sepsis Secondary to colitis/enteritis. Physiology improved with IV fluids and antibiotics. Has some tachycardia. GI pathogen panel and C. Diff negative. Urine culture negative. -continue Flagyl and Cefepime -blood/urine culture  Hypotension Improved with IV fluids. Troponin negative. Albumin extremely low. -monitor BP -will consider giving albumin  Dehydration Poor oral intake -encourage oral intake -dietitian consulted  Normocytic anemia Slight decrease likely dilutional -repeat CBC  COPD Stable. -continue nebulizers  Acute kidney injury Secondary to hypotension. Near baseline of around 1.1.  Anasarca Likely secondary hypoalbuminemia. Fatty liver, otherwise, no other indication for liver pathology.  Hesitant to diurese in setting of hypovolemia -nephrology consult  Oliguria Unknown if secondary to inconsistent in/out or not. Kidney function appears to be good. Patient already with a foley. Slightly improved today. -strict in/out -urine sodium/creatinine  Hematuria Hyperbilirubinemia Tea colored urine. Decreased urine output. Liver unremarkable for acute process. ?worsening infarct. Vascular surgery without recommendations for thrombosis on consultation.  Hyponatremia Mild. Likely secondary to third spacing.  Abnormal CT angiogram of chest/abdomen/pelvis Thrombus of left renal artery. Vascular surgery consulted with no recommendations for acute management.  Thrombocytopenia Worsening. Unknown cause. Associated anemia. Possibly secondary to significant fluid administration. No evidence of intravascular hemolysis so far -haptoglobin pending -CBC pending today -haptoglobin, lactate dehydrogenase, bilirubin -recheck CBC  GERD Stable. -continue PPI  Anxiety Stable. -continue Celexa, and Klonopin prn -discontinued Xanax  [place] type B aortic dissection Patient is s/p stent graft placement on previous admission. -continue metoprolol at reduce dose  Tachycardia Rebound secondary to abrupt cessation of beta-blocker -continue metoprolol at reduced dose of 12.37m BID  Edema Secondary to IV fluids. Recent echo significant for grade 1 diastolic dysfunction. -echocardiogram pending   DVT prophylaxis: SCDs Code Status: Full code Family Communication: None at bedside Disposition Plan: Discharge pending medical improvement   Consultants:   Vascular sugery  Procedures:   ABI: 1.00 (right); 0.77 (left)  Antimicrobials:  Levaquin (8/8>>8/10)  Vancomycin (8/8>>  Metronidazole (8/8>>  Cefepime (8/10>>   Subjective: Decreased appetite. Decreased urine output  Objective: Vitals:   09/12/16 2351 09/13/16 0353 09/13/16 0755 09/13/16 0931  BP: (!) 115/49 (!)  106/56 136/86   Pulse: 78 81 (!) 121   Resp: '17 18 18   ' Temp: 98.5 F (36.9 C) 97.6 F (36.4 C) (!) 97.5 F (36.4 C)   TempSrc: Oral Oral Oral   SpO2: 98% 98% 98% 100%  Weight:  79.4 kg (175 lb)    Height:        Intake/Output Summary (Last 24 hours) at 09/13/16 0955 Last data filed at 09/12/16 1813  Gross per 24 hour  Intake              680 ml  Output              350 ml  Net              330 ml   Filed Weights   09/11/16 0438 09/12/16 0631 09/13/16 0353  Weight: 75.4 kg (166 lb 3.6 oz) 85.4 kg (188 lb 4.4 oz) 79.4 kg (175 lb)    Examination:  General exam: Calm and comfortable. Respiratory system: Clear to auscultation bilaterally. Unlabored work of breathing. No wheezing or rales. Cardiovascular system: S1 & S2 heard, tachycardia, regular rhythm. Gastrointestinal system: Soft, mildly tender in epigastrum, non-distended, no guarding, no rebound Central nervous system: Alert, oriented. Extremities: 2+ edema in feet bilaterally. No calf tenderness Skin: No cyanosis. No rashes Psychiatry: Judgement and insight appear normal. Mood & affect flat and depressed. withdrawn.     Data Reviewed: I have personally reviewed following labs and imaging studies  CBC:  Recent Labs Lab 09/05/2016 1139 09/10/16 0949 09/11/16 0714 09/12/16 0707  WBC 21.0* 13.8* 14.5* 14.2*  NEUTROABS 18.2* 11.4*  --   --   HGB 10.1* 9.9* 10.1* 8.8*  HCT 30.3* 29.8* 28.4* 26.1*  MCV 94.4 92.5 95.9 97.8  PLT 84* 76* 81* 62*   Basic Metabolic Panel:  Recent Labs Lab 09/18/2016 1139 09/10/16 0949 09/11/16 0714 09/12/16 0707  NA 130* 135 134* 133*  K 4.9 4.4 4.8 3.8  CL 99* 107 108 108  CO2 24 22 19* 20*  GLUCOSE 161* 67 110* 104*  BUN 28* 23* 21* 17  CREATININE 1.45* 1.13* 1.17* 1.13*  CALCIUM 7.0* 6.6* 6.9* 7.0*  MG  --  1.5*  --   --    GFR: Estimated Creatinine Clearance: 52.8 mL/min (A) (by C-G formula based on SCr of 1.13 mg/dL (H)). Liver Function Tests:  Recent Labs Lab  09/24/2016 1139 09/10/16 0949 09/12/16 1059  AST 21 19  --   ALT 15 15  --   ALKPHOS 184* 191*  --   BILITOT 0.6 0.8 0.8  PROT 4.7* 4.7*  --   ALBUMIN 1.0* <1.0*  --     Recent Labs Lab 09/29/2016 1139  LIPASE 14   No results for input(s): AMMONIA in the last 168 hours. Coagulation Profile:  Recent Labs Lab 09/10/16 0949  INR 1.29   Cardiac Enzymes:  Recent Labs Lab 09/26/2016 1139 09/10/16 0949  TROPONINI <0.03 <0.03   BNP (last 3 results) No results for input(s): PROBNP in the last 8760 hours. HbA1C: No results for input(s): HGBA1C in the last 72 hours. CBG:  Recent Labs Lab 09/12/16 0616 09/12/16 1133 09/12/16 1643 09/12/16 2104 09/13/16 0555  GLUCAP 79 94 81 82 71   Lipid Profile: No results for input(s): CHOL, HDL, LDLCALC, TRIG, CHOLHDL, LDLDIRECT in the last 72 hours. Thyroid Function Tests: No results for input(s): TSH, T4TOTAL, FREET4, T3FREE, THYROIDAB in the last 72 hours. Anemia Panel: No results for input(s): VITAMINB12, FOLATE, FERRITIN, TIBC, IRON, RETICCTPCT in the last 72 hours. Sepsis Labs:  Recent Labs Lab 09/28/2016 1449 09/05/2016 2034 09/10/16 0558 09/10/16 0949  PROCALCITON  --   --   --  0.24  LATICACIDVEN 3.24* 2.65* 1.3 1.3  Recent Results (from the past 240 hour(s))  Gastrointestinal Panel by PCR , Stool     Status: None   Collection Time: 09/07/2016  1:30 AM  Result Value Ref Range Status   Campylobacter species NOT DETECTED NOT DETECTED Final   Plesimonas shigelloides NOT DETECTED NOT DETECTED Final   Salmonella species NOT DETECTED NOT DETECTED Final   Yersinia enterocolitica NOT DETECTED NOT DETECTED Final   Vibrio species NOT DETECTED NOT DETECTED Final   Vibrio cholerae NOT DETECTED NOT DETECTED Final   Enteroaggregative E coli (EAEC) NOT DETECTED NOT DETECTED Final   Enteropathogenic E coli (EPEC) NOT DETECTED NOT DETECTED Final   Enterotoxigenic E coli (ETEC) NOT DETECTED NOT DETECTED Final   Shiga like toxin  producing E coli (STEC) NOT DETECTED NOT DETECTED Final   Shigella/Enteroinvasive E coli (EIEC) NOT DETECTED NOT DETECTED Final   Cryptosporidium NOT DETECTED NOT DETECTED Final   Cyclospora cayetanensis NOT DETECTED NOT DETECTED Final   Entamoeba histolytica NOT DETECTED NOT DETECTED Final   Giardia lamblia NOT DETECTED NOT DETECTED Final   Adenovirus F40/41 NOT DETECTED NOT DETECTED Final   Astrovirus NOT DETECTED NOT DETECTED Final   Norovirus GI/GII NOT DETECTED NOT DETECTED Final   Rotavirus A NOT DETECTED NOT DETECTED Final   Sapovirus (I, II, IV, and V) NOT DETECTED NOT DETECTED Final  C difficile quick scan w PCR reflex     Status: None   Collection Time: 09/23/2016  1:30 AM  Result Value Ref Range Status   C Diff antigen NEGATIVE NEGATIVE Final   C Diff toxin NEGATIVE NEGATIVE Final   C Diff interpretation No C. difficile detected.  Final  Blood Culture (routine x 2)     Status: None (Preliminary result)   Collection Time: 09/23/2016  2:09 PM  Result Value Ref Range Status   Specimen Description BLOOD RIGHT ANTECUBITAL  Final   Special Requests   Final    BOTTLES DRAWN AEROBIC AND ANAEROBIC Blood Culture adequate volume   Culture   Final    NO GROWTH 3 DAYS Performed at [facility], [address] [place], [place] [address]    Report Status PENDING  Incomplete  Culture, Urine     Status: None   Collection Time: 09/08/2016  9:40 PM  Result Value Ref Range Status   Specimen Description URINE, CLEAN CATCH  Final   Special Requests NONE  Final   Culture   Final    NO GROWTH Performed at [facility], [place] [address] [place], [place] [address]    Report Status 09/11/2016 FINAL  Final         Radiology Studies: No results found.      Scheduled Meds: . aspirin EC  81 mg Oral Daily  . citalopram  40 mg Oral QHS  . feeding supplement  1 Container Oral TID BM  . insulin aspart  0-5 Units Subcutaneous QHS  . insulin aspart  0-9 Units Subcutaneous TID WC    . metoprolol tartrate  12.5 mg Oral BID  . pantoprazole (PROTONIX) IV  40 mg Intravenous Q24H  . sodium chloride flush  3 mL Intravenous Q12H  . tiotropium  18 mcg Inhalation Daily   Continuous Infusions: . ceFEPime (MAXIPIME) IV 1 g (09/13/16 0814)  . metronidazole Stopped (09/13/16 0855)     LOS: 4 days     Cordelia Poche, MD [facility] 09/13/2016, 9:55 AM Pager: [phone]  If 7PM-7AM, please contact night-coverage [email or web address] Password [email or web address]  09/13/2016, 9:55 AM

## 2016-09-13 NOTE — Consult Note (Signed)
Renal Service Consult Note [facility]  Haley Graham 09/13/2016 [place] Requesting Physician:  Dr Lonny Prude   Reason for Consult:  Edema HPI: The patient is a 61 y.o. year-old with hx of DM, HTN, COPD, depression/ anxiety, gastroparesis and IBS. She had recent aortic dissection in May 2018, dc'd home August 22, 2016. She now presented on 09/19/2016 with gen'd weakness and poor po intake. Creat is 1.1 but pt has marked edema and we are asked to see her for this.     Patient was here from May 19 - August 22, 2016 for acute type B aortic dissection.  The dissection started after the great vessels and infarcted the L kidney.  Creat remained stable during the admission in the range of 0.9- 1.2.  Patient underwent stent-graft procedure done by vasc surgery on 6/12 after failing conservative Rx.  She was on TNA but then transitioned to po intake.  She had nightly tube feedings that appears to make things worse per the DC summary, so although she needed the calories, these were stopped.  She refused LTAC admission and so was dc'd home with [facility] on 7/21.     ROS  denies CP  no joint pain   no HA  no blurry vision  no rash  no diarrhea  no nausea/ vomiting  no dysuria  no difficulty voiding  no change in urine color    Past Medical History  Past Medical History:  Diagnosis Date  . Anxiety   . Arthritis   . Diabetes mellitus without complication ([place])   . Gastroparesis   . GERD (gastroesophageal reflux disease)   . Hypertension   . IBS (irritable bowel syndrome)    Past Surgical History  Past Surgical History:  Procedure Laterality Date  . ABDOMINAL HYSTERECTOMY    . AORTOGRAM N/A 07/24/2016   Procedure: [facility] AND ABDOMINAL AORTA  ANGIOGRAM;  Surgeon: Waynetta Sandy, MD;  Location: [place];  Service: Vascular;  Laterality: N/A;  . CESAREAN SECTION    . CHOLECYSTECTOMY    . COLONOSCOPY    . COLONOSCOPY N/A 06/10/2016   Procedure: COLONOSCOPY;  Surgeon: Rogene Houston, MD;  [facility];  Service: Endoscopy;  Laterality: N/A;  8:30  . POLYPECTOMY  06/10/2016   Procedure: POLYPECTOMY;  Surgeon: Rogene Houston, MD;  [facility];  Service: Endoscopy;;  multiple colon  . THORACIC AORTIC ENDOVASCULAR STENT GRAFT N/A 08/06/2016   Procedure: THORACIC AORTIC ENDOVASCULAR STENT GRAFT/Thorasic and Abdominal Angiogram, Entravascular ultrasound., Open femoral exposure,Left Brachial access, and patch angioplasty right femoral artery.;  Surgeon: Serafina Mitchell, MD;  Location: [facility];  Service: Vascular;  Laterality: N/A;  . UPPER GASTROINTESTINAL ENDOSCOPY     Family History  Family History  Problem Relation Age of Onset  . Cancer Mother   . Stroke Mother   . Heart attack Mother   . Stroke Father    Social History  reports that she has been smoking Cigarettes.  She has a 17.25 pack-year smoking history. She has never used smokeless tobacco. She reports that she does not drink alcohol or use drugs. Allergies  Allergies  Allergen Reactions  . Penicillins Rash and Other (See Comments)    PATIENT HAS HAD A PCN REACTION WITH IMMEDIATE RASH, FACIAL/TONGUE/THROAT SWELLING, SOB, OR LIGHTHEADEDNESS WITH HYPOTENSION:  #  #  #  YES  #  #  #   Has patient had a PCN reaction causing severe rash involving mucus membranes  or skin necrosis: no Has patient had a PCN reaction that required hospitalization no Has patient had a PCN reaction occurring within the last 10 years:no If all of the above answers are "NO", then may proceed with Cephalosporin use.  . Ativan [Lorazepam] Other (See Comments)    Makes pt very confused and more agitated, irritable.  . Codeine Nausea And Vomiting  . Reglan [Metoclopramide] Other (See Comments)    TACHYCARDIA   Home medications Prior to Admission medications   Medication Sig Start Date End Date Taking? Authorizing Provider  ALPRAZolam Duanne Moron) 0.5 MG tablet Take 0.5-1 tablets (0.25-0.5 mg total) by mouth 2 (two)  times daily as needed for anxiety. 08/22/16  Yes Gold, Wayne E, PA-C  alum & mag hydroxide-simeth (MAALOX/MYLANTA) 200-200-20 MG/5ML suspension Take 30 mLs by mouth 2 (two) times daily as needed for indigestion or heartburn.   Yes [provider]  aspirin EC 81 MG EC tablet Take 1 tablet (81 mg total) by mouth daily. 08/22/16  Yes Gold, Wayne E, PA-C  citalopram (CELEXA) 40 MG tablet Take 40 mg by mouth at bedtime.    Yes [provider]  clonazePAM (KLONOPIN) 1 MG tablet Take 1 tablet (1 mg total) by mouth at bedtime. Patient taking differently: Take 1 mg by mouth at bedtime as needed (for sleep).  08/22/16  Yes Gold, Wayne E, PA-C  fluticasone (FLONASE) 50 MCG/ACT nasal spray Place 2 sprays into both nostrils daily as needed for allergies or rhinitis.  05/17/16  Yes [provider]  glipiZIDE-metformin (METAGLIP) 5-500 MG tablet Take 0.5 tablets by mouth 2 (two) times daily before a meal. 08/22/16  Yes Gold, Wayne E, PA-C  metoprolol tartrate (LOPRESSOR) 25 MG tablet Take 25 mg by mouth 2 (two) times daily.   Yes [provider]  mometasone-formoterol (DULERA) 200-5 MCG/ACT AERO Inhale 2 puffs into the lungs 2 (two) times daily. 08/22/16  Yes Gold, Wayne E, PA-C  ondansetron (ZOFRAN) 4 MG tablet Take 4 mg by mouth every 8 (eight) hours as needed for nausea or vomiting.   Yes [provider]  pantoprazole (PROTONIX) 40 MG tablet Take 40 mg by mouth daily as needed (acid reflux/ indigestion).  11/12/11  Yes Rehman, Mechele Dawley, MD  Probiotic Product (PROBIOTIC PO) Take 1 tablet by mouth at bedtime. Gut Restore Ultimate (probiotic with zinc and vitamin C)   Yes [provider]  traMADol (ULTRAM) 50 MG tablet Take 1 tablet (50 mg total) by mouth every 6 (six) hours as needed. Patient taking differently: Take 25 mg by mouth every 6 (six) hours as needed for moderate pain.  09/01/16  Yes Ivin Poot, MD  Metoprolol Tartrate 37.5 MG TABS Take 37.5 mg by  mouth 2 (two) times daily. Patient not taking: Reported on 09/20/2016 08/22/16   Jadene Pierini E, PA-C  oxyCODONE 10 MG TABS Take 1 tablet (10 mg total) by mouth 2 (two) times daily as needed for severe pain. Patient not taking: Reported on 09/23/2016 08/22/16   John Giovanni, [place]   Liver Function Tests  Recent Labs Lab 09/26/2016 1139 09/10/16 0949 09/12/16 1059  AST 21 19  --   ALT 15 15  --   ALKPHOS 184* 191*  --   BILITOT 0.6 0.8 0.8  PROT 4.7* 4.7*  --   ALBUMIN 1.0* <1.0*  --     Recent Labs Lab 09/25/2016 1139  LIPASE 14   CBC  Recent Labs Lab 10/01/2016 1139 09/10/16 0949 09/11/16 0714 09/12/16  0707  WBC 21.0* 13.8* 14.5* 14.2*  NEUTROABS 18.2* 11.4*  --   --   HGB 10.1* 9.9* 10.1* 8.8*  HCT 30.3* 29.8* 28.4* 26.1*  MCV 94.4 92.5 95.9 97.8  PLT 84* 76* 81* 62*   Basic Metabolic Panel  Recent Labs Lab 09/05/2016 1139 09/10/16 0949 09/11/16 0714 09/12/16 0707  NA 130* 135 134* 133*  K 4.9 4.4 4.8 3.8  CL 99* 107 108 108  CO2 24 22 19* 20*  GLUCOSE 161* 67 110* 104*  BUN 28* 23* 21* 17  CREATININE 1.45* 1.13* 1.17* 1.13*  CALCIUM 7.0* 6.6* 6.9* 7.0*   Iron/TIBC/Ferritin/ %Sat    Component Value Date/Time   IRON 36 09/15/2016 2011   TIBC 98 (L) 09/12/2016 2011   FERRITIN 585 (H) 09/08/2016 2011   IRONPCTSAT 37 (H) 09/13/2016 2011    Vitals:   09/12/16 2351 09/13/16 0353 09/13/16 0755 09/13/16 0931  BP: (!) 115/49 (!) 106/56 136/86   Pulse: 78 81 (!) 121   Resp: 17 18 18    Temp: 98.5 F (36.9 C) 97.6 F (36.4 C) (!) 97.5 F (36.4 C)   TempSrc: Oral Oral Oral   SpO2: 98% 98% 98% 100%  Weight:  79.4 kg (175 lb)    Height:       Exam Gen adult WF not in distress, lying in bed No rash, cyanosis or gangrene Sclera anicteric, throat clear  No jvd or bruits Chest clear bilat RRR no MRG Abd soft ntnd no mass or ascites +bs GU defer MS no joint effusions or deformity Ext diffuse 2+ edema of legs > arms / no wounds or ulcers Neuro is alert, Ox3,  gen'd weakness, can roll in bed but cannot sit up by herself.   UA > cloudy , many bact, prot 100, 6-30 wbc / epi/ rbc  Impression: 1.  Anasarca - suspect this is due to nothing other than severe malnutrition/ hypoalbuminemia. Nothing to suggest neph syndrome/ CHF/ liver disease and renal function isn't bad enough to cause fluid retention. I spoke w [facility], Dr Donzetta Matters - the recent Type B dissection infarcted the left kidney.  I believe it is conceivable that the R kidney has an RAS type picture related to the dissection, but this is low likelihood and regardless could not be fixed given her new vascular anatomy and her comorbidities. Would recommend try to manage conservatively with oral loop diuretics and attention to nutrition.  IV albumin is a consideration but would not be long-term solution so would off for now.  2.  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 Baseline creat 1.1 3.  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  FTT/ severe malnutrition - not sure cause other than severe depression as a possibility 5.  Debility - can't get OOB. According to some of the doctor's who worked w/ her during her dissection admission she was in the bed for over 6 weeks and never made an effort to get out of bed.  Patient today says that when she went home from the hospital, she was supposed to do therapy at home but "didn't ever get up to it".   6.  Psych - has hx of sig depression, not sure what if anything can be done about this. Poor prognosis.    Rec - would try po lasix 20- 40 mg bid to start, ^ as needed up to 80 bid.  See if this helps.  Will sign off.   Rob  [facility] pager [phone]   09/13/2016, 3:40 PM

## 2016-09-14 ENCOUNTER — Inpatient Hospital Stay (HOSPITAL_COMMUNITY): Payer: BLUE CROSS/BLUE SHIELD

## 2016-09-14 DIAGNOSIS — I5032 Chronic diastolic (congestive) heart failure: Secondary | ICD-10-CM

## 2016-09-14 LAB — BASIC METABOLIC PANEL
ANION GAP: 8 (ref 5–15)
BUN: 16 mg/dL (ref 6–20)
CHLORIDE: 107 mmol/L (ref 101–111)
CO2: 17 mmol/L — AB (ref 22–32)
Calcium: 7.1 mg/dL — ABNORMAL LOW (ref 8.9–10.3)
Creatinine, Ser: 1.21 mg/dL — ABNORMAL HIGH (ref 0.44–1.00)
GFR calc Af Amer: 55 mL/min — ABNORMAL LOW (ref >60)
GFR, EST NON AFRICAN AMERICAN: 48 mL/min — AB (ref >60)
GLUCOSE: 110 mg/dL — AB (ref 65–99)
POTASSIUM: 4 mmol/L (ref 3.5–5.1)
Sodium: 132 mmol/L — ABNORMAL LOW (ref 135–145)

## 2016-09-14 LAB — BPAM RBC
BLOOD PRODUCT EXPIRATION DATE: 201808292359
BLOOD PRODUCT EXPIRATION DATE: 201808292359
Blood Product Expiration Date: 201808292359
Blood Product Expiration Date: 201808292359
ISSUE DATE / TIME: 201808091356
ISSUE DATE / TIME: 201808091356
ISSUE DATE / TIME: 201808091356
ISSUE DATE / TIME: 201808091356
UNIT TYPE AND RH: 5100
UNIT TYPE AND RH: 5100
Unit Type and Rh: 5100
Unit Type and Rh: 5100

## 2016-09-14 LAB — CULTURE, BLOOD (ROUTINE X 2)
Culture: NO GROWTH
Special Requests: ADEQUATE

## 2016-09-14 LAB — TYPE AND SCREEN
ABO/RH(D): O POS
ABO/RH(D): O POS
ANTIBODY SCREEN: POSITIVE
Antibody Screen: POSITIVE
DAT, IGG: POSITIVE
UNIT DIVISION: 0
UNIT DIVISION: 0
Unit division: 0
Unit division: 0

## 2016-09-14 LAB — GLUCOSE, CAPILLARY
GLUCOSE-CAPILLARY: 84 mg/dL (ref 65–99)
GLUCOSE-CAPILLARY: 128 mg/dL — AB (ref 65–99)
Glucose-Capillary: 180 mg/dL — ABNORMAL HIGH (ref 65–99)
Glucose-Capillary: 78 mg/dL (ref 65–99)
Glucose-Capillary: 134 mg/dL — ABNORMAL HIGH (ref 65–99)

## 2016-09-14 LAB — ECHOCARDIOGRAM COMPLETE
Height: 63 in
WEIGHTICAEL: 2800 [oz_av]

## 2016-09-14 LAB — CBC
HEMATOCRIT: 35.2 % — AB (ref 36.0–46.0)
HEMOGLOBIN: 11.3 g/dL — AB (ref 12.0–15.0)
MCH: 30.1 pg (ref 26.0–34.0)
MCHC: 32.1 g/dL (ref 30.0–36.0)
MCV: 93.6 fL (ref 78.0–100.0)
Platelets: 81 10*3/uL — ABNORMAL LOW (ref 150–400)
RBC: 3.76 MIL/uL — ABNORMAL LOW (ref 3.87–5.11)
RDW: 17.8 % — ABNORMAL HIGH (ref 11.5–15.5)
WBC: 21.6 10*3/uL — ABNORMAL HIGH (ref 4.0–10.5)

## 2016-09-14 LAB — GAMMA GT: GGT: 123 U/L — AB (ref 7–50)

## 2016-09-14 MED ORDER — ALPRAZOLAM 0.25 MG PO TABS
0.2500 mg | ORAL_TABLET | Freq: Two times a day (BID) | ORAL | Status: DC | PRN
  Administered 2016-09-15: 0.5 mg via ORAL
  Filled 2016-09-14: qty 2

## 2016-09-14 MED ORDER — ENSURE ENLIVE PO LIQD
237.0000 mL | Freq: Three times a day (TID) | ORAL | Status: DC
  Administered 2016-09-14 – 2016-09-18 (×6): 237 mL via ORAL
  Administered 2016-09-20: 200 mL via ORAL
  Administered 2016-09-21 – 2016-10-18 (×33): 237 mL via ORAL

## 2016-09-14 MED ORDER — FUROSEMIDE 20 MG PO TABS
20.0000 mg | ORAL_TABLET | Freq: Every day | ORAL | Status: DC
  Administered 2016-09-14 – 2016-09-16 (×3): 20 mg via ORAL
  Filled 2016-09-14 (×3): qty 1

## 2016-09-14 NOTE — Progress Notes (Addendum)
PROGRESS NOTE    Haley Graham  HBZ:[id number] DOB: Feb 01, 1956 DOA: 09/06/2016 PCP: Glenda Chroman, MD   Brief Narrative: Haley Graham is a 61 y.o. 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 She presented with weakness and met sepsis criteria with concern for enteritis. Antibiotics started. Cultures pending.   Assessment & Plan:   Principal Problem:   Sepsis ([place]) Active Problems:   GERD (gastroesophageal reflux disease)   Dissection of thoracoabdominal aorta (HCC)   AAA (abdominal aortic aneurysm) (HCC)   Benign essential HTN   Diabetes mellitus type 2 in obese (HCC)   Anxiety state   Hyponatremia   Leukocytosis   Hypotension   AKI (acute kidney injury) ([place])   Dehydration   Weakness   Anemia   Thrombocytopenia (HCC)   Abnormal computed tomography angiography (CTA) of abdomen and pelvis   Colitis: Probable   Protein-calorie malnutrition, severe   Sepsis Secondary to colitis/enteritis. Physiology improved with IV fluids and antibiotics. Has some tachycardia. GI pathogen panel and C. Diff negative. Urine culture negative. -continue Flagyl and Cefepime -blood/urine culture  Hypotension Improved with IV fluids which has made her anasarca worse. Troponin negative. Albumin extremely low. -monitor BP  Dehydration Poor oral intake -encourage oral intake -dietitian consulted  Normocytic anemia Slight decrease likely dilutional -repeat CBC  COPD Stable. -continue nebulizers  Acute kidney injury Secondary to hypotension. Improved.  Anasarca Likely secondary hypoalbuminemia. Fatty liver, otherwise, no other indication for liver pathology. Hesitant to diurese  in setting of hypovolemia -nephrology consult and recommended diuresis  Oliguria Improvement with diuresis -strict in/out  Hematuria Hyperbilirubinemia Tea colored urine. Decreased urine output. Liver unremarkable for acute process. ?worsening infarct. Vascular surgery without recommendations for thrombosis on consultation.  Hyponatremia Mild. Likely secondary to third spacing.  Elevated alkaline phosphatase S/p cholecystecomy -RUQ ultrasound to look at liver -GGT  Abnormal CT angiogram of chest/abdomen/pelvis Thrombus of left renal artery. Vascular surgery consulted with no recommendations for acute management.  Thrombocytopenia Worsening. Unknown cause. Associated anemia. Possibly secondary to significant fluid administration. No evidence of intravascular hemolysis so far. Improved.  GERD Stable. -continue PPI  Anxiety Stable. Patient would rather have Xanax -Xanax -discontinue Klonipin  [place] type B aortic dissection Patient is s/p stent graft placement on previous admission. -continue metoprolol at reduce dose  Tachycardia Rebound secondary to abrupt cessation of beta-blocker. Resolved. -continue metoprolol at reduced dose of 12.61m BID  Edema Secondary to IV fluids. Recent echo significant for grade 1 diastolic dysfunction. -echocardiogram pending  Leukocytosis Worsening in setting of improving fluid overload. Appears to be close to previous baseline. No evidence of infection. Blood cultures negative. Afebrile. Pro calcitonin 0.24 on admission. -monitor   DVT prophylaxis: SCDs Code Status: Full code Family Communication: None at bedside Disposition Plan: Discharge pending medical improvement   Consultants:   Vascular sugery  Procedures:   ABI: 1.00 (right); 0.77 (left)  Antimicrobials:  Levaquin (8/8>>8/10)  Vancomycin (8/8>>  Metronidazole (8/8>>  Cefepime (8/10>>   Subjective: Nausea. Loose bowel movements  yesterday.  Objective: Vitals:   09/14/16 0000 09/14/16 0553 09/14/16 0555 09/14/16 0815  BP: (!) 133/58  (!) 141/74   Pulse: 99 92 93   Resp:      Temp:  97.6 F (36.4 C)    TempSrc:  Oral  SpO2: 100% 98% 98% 98%  Weight:      Height:        Intake/Output Summary (Last 24 hours) at 09/14/16 1059 Last data filed at 09/14/16 1000  Gross per 24 hour  Intake             1580 ml  Output                0 ml  Net             1580 ml   Filed Weights   09/11/16 0438 09/12/16 0631 09/13/16 0353  Weight: 75.4 kg (166 lb 3.6 oz) 85.4 kg (188 lb 4.4 oz) 79.4 kg (175 lb)    Examination:  General exam: Calm and comfortable. Respiratory system: Clear to auscultation bilaterally. Unlabored work of breathing. No wheezing or rales. Cardiovascular system: Regular rate and rhythm. Normal S1 and S2. No heart murmurs present. No extra heart sounds Gastrointestinal system: Soft, mildly tender in epigastrum, non-distended, no guarding, no rebound Central nervous system: Alert, oriented. Extremities: 2+ edema in legs bilaterally. No calf tenderness Skin: No cyanosis. No rashes Psychiatry: Judgement and insight appear normal. Mood & affect flat and depressed. withdrawn.     Data Reviewed: I have personally reviewed following labs and imaging studies  CBC:  Recent Labs Lab 09/18/2016 1139 09/10/16 0949 09/11/16 0714 09/12/16 0707 09/13/16 1755 09/14/16 0728  WBC 21.0* 13.8* 14.5* 14.2* 16.9* 21.6*  NEUTROABS 18.2* 11.4*  --   --   --   --   HGB 10.1* 9.9* 10.1* 8.8* 9.7* 11.3*  HCT 30.3* 29.8* 28.4* 26.1* 29.9* 35.2*  MCV 94.4 92.5 95.9 97.8 89.8 93.6  PLT 84* 76* 81* 62* 89* 81*   Basic Metabolic Panel:  Recent Labs Lab 09/10/16 0949 09/11/16 0714 09/12/16 0707 09/13/16 1755 09/14/16 0728  NA 135 134* 133* 133* 132*  K 4.4 4.8 3.8 3.9 4.0  CL 107 108 108 108 107  CO2 22 19* 20* 19* 17*  GLUCOSE 67 110* 104* 126* 110*  BUN 23* 21* '17 17 16  ' CREATININE 1.13* 1.17* 1.13*  1.27* 1.21*  CALCIUM 6.6* 6.9* 7.0* 7.0* 7.1*  MG 1.5*  --   --   --   --    GFR: Estimated Creatinine Clearance: 49.3 mL/min (A) (by C-G formula based on SCr of 1.21 mg/dL (H)). Liver Function Tests:  Recent Labs Lab 09/27/2016 1139 09/10/16 0949 09/12/16 1059 09/13/16 1755  AST 21 19  --  25  ALT 15 15  --  13*  ALKPHOS 184* 191*  --  270*  BILITOT 0.6 0.8 0.8 0.9  PROT 4.7* 4.7*  --  4.9*  ALBUMIN 1.0* <1.0*  --  1.3*    Recent Labs Lab 09/27/2016 1139  LIPASE 14   No results for input(s): AMMONIA in the last 168 hours. Coagulation Profile:  Recent Labs Lab 09/10/16 0949  INR 1.29   Cardiac Enzymes:  Recent Labs Lab 09/06/2016 1139 09/10/16 0949 09/13/16 1755  CKTOTAL  --   --  13*  TROPONINI <0.03 <0.03  --    BNP (last 3 results) No results for input(s): PROBNP in the last 8760 hours. HbA1C: No results for input(s): HGBA1C in the last 72 hours. CBG:  Recent Labs Lab 09/13/16 1139 09/13/16 1635 09/13/16 1907 09/13/16 2105 09/14/16 0628  GLUCAP 98 94 115* 101* 78   Lipid Profile: No results for input(s): CHOL, HDL, LDLCALC, TRIG, CHOLHDL, LDLDIRECT in the last 72 hours. Thyroid Function  Tests: No results for input(s): TSH, T4TOTAL, FREET4, T3FREE, THYROIDAB in the last 72 hours. Anemia Panel: No results for input(s): VITAMINB12, FOLATE, FERRITIN, TIBC, IRON, RETICCTPCT in the last 72 hours. Sepsis Labs:  Recent Labs Lab 09/24/2016 1449 09/08/2016 2034 09/10/16 0558 09/10/16 0949  PROCALCITON  --   --   --  0.24  LATICACIDVEN 3.24* 2.65* 1.3 1.3    Recent Results (from the past 240 hour(s))  Gastrointestinal Panel by PCR , Stool     Status: None   Collection Time: 09/14/2016  1:30 AM  Result Value Ref Range Status   Campylobacter species NOT DETECTED NOT DETECTED Final   Plesimonas shigelloides NOT DETECTED NOT DETECTED Final   Salmonella species NOT DETECTED NOT DETECTED Final   Yersinia enterocolitica NOT DETECTED NOT DETECTED Final    Vibrio species NOT DETECTED NOT DETECTED Final   Vibrio cholerae NOT DETECTED NOT DETECTED Final   Enteroaggregative E coli (EAEC) NOT DETECTED NOT DETECTED Final   Enteropathogenic E coli (EPEC) NOT DETECTED NOT DETECTED Final   Enterotoxigenic E coli (ETEC) NOT DETECTED NOT DETECTED Final   Shiga like toxin producing E coli (STEC) NOT DETECTED NOT DETECTED Final   Shigella/Enteroinvasive E coli (EIEC) NOT DETECTED NOT DETECTED Final   Cryptosporidium NOT DETECTED NOT DETECTED Final   Cyclospora cayetanensis NOT DETECTED NOT DETECTED Final   Entamoeba histolytica NOT DETECTED NOT DETECTED Final   Giardia lamblia NOT DETECTED NOT DETECTED Final   Adenovirus F40/41 NOT DETECTED NOT DETECTED Final   Astrovirus NOT DETECTED NOT DETECTED Final   Norovirus GI/GII NOT DETECTED NOT DETECTED Final   Rotavirus A NOT DETECTED NOT DETECTED Final   Sapovirus (I, II, IV, and V) NOT DETECTED NOT DETECTED Final  C difficile quick scan w PCR reflex     Status: None   Collection Time: 09/11/2016  1:30 AM  Result Value Ref Range Status   C Diff antigen NEGATIVE NEGATIVE Final   C Diff toxin NEGATIVE NEGATIVE Final   C Diff interpretation No C. difficile detected.  Final  Blood Culture (routine x 2)     Status: None (Preliminary result)   Collection Time: 09/04/2016  2:09 PM  Result Value Ref Range Status   Specimen Description BLOOD RIGHT ANTECUBITAL  Final   Special Requests   Final    BOTTLES DRAWN AEROBIC AND ANAEROBIC Blood Culture adequate volume   Culture   Final    NO GROWTH 4 DAYS Performed at [facility], [address], [place], [place] [address]    Report Status PENDING  Incomplete  Culture, Urine     Status: None   Collection Time: 09/25/2016  9:40 PM  Result Value Ref Range Status   Specimen Description URINE, CLEAN CATCH  Final   Special Requests NONE  Final   Culture   Final    NO GROWTH Performed at [facility], [place] [address], [place], [place] [address]    Report  Status 09/11/2016 FINAL  Final         Radiology Studies: US Abdomen Limited Ruq  Result Date: 09/14/2016 CLINICAL DATA:  Elevated liver enzymes EXAM: ULTRASOUND ABDOMEN LIMITED RIGHT UPPER QUADRANT COMPARISON:  None. FINDINGS: Gallbladder: Surgically absent. Common bile duct: Diameter: 4 mm. No intrahepatic or extrahepatic biliary duct dilatation. Liver: No focal lesion identified. Liver echogenicity is increased diffusely. A slight amount of ascites is noted. There is a right pleural effusion. IMPRESSION: 1.  Gallbladder absent. 2. Diffuse increase in liver echogenicity,  a finding most likely indicative of hepatic steatosis. While no focal liver lesions are evident on this study, it must be cautioned that the sensitivity of ultrasound for detection of focal liver lesions is diminished significantly in this circumstance. 3.  Trace ascites. 4.  Right pleural effusion. Electronically Signed   By: Lowella Grip III M.D.   On: 09/14/2016 10:07        Scheduled Meds: . aspirin EC  81 mg Oral Daily  . citalopram  40 mg Oral QHS  . feeding supplement  1 Container Oral TID BM  . insulin aspart  0-5 Units Subcutaneous QHS  . insulin aspart  0-9 Units Subcutaneous TID WC  . metoprolol tartrate  12.5 mg Oral BID  . pantoprazole (PROTONIX) IV  40 mg Intravenous Q24H  . sodium chloride flush  3 mL Intravenous Q12H  . tiotropium  18 mcg Inhalation Daily   Continuous Infusions: . ceFEPime (MAXIPIME) IV Stopped (09/14/16 0629)  . metronidazole Stopped (09/14/16 1000)     LOS: 5 days     Cordelia Poche, MD [facility] 09/14/2016, 10:59 AM Pager: [phone]  If 7PM-7AM, please contact night-coverage [email or web address] Password [facility] 09/14/2016, 10:59 AM

## 2016-09-14 NOTE — Clinical Social Work Note (Signed)
Clinical Social Work Assessment  Patient Details  Name: Haley Graham MRN: [id number] Date of Birth: 1955/08/16  Date of referral:  09/14/16               Reason for consult:  Facility Placement                Permission sought to share information with:    Permission granted to share information::     Name::        Agency::     Relationship::     Contact Information:     Housing/Transportation Living arrangements for the past 2 months:  Single Family Home Source of Information:  Patient Patient Interpreter Needed:  None Criminal Activity/Legal Involvement Pertinent to Current Situation/Hospitalization:  No - Comment as needed Significant Relationships:  Adult Children, Spouse Lives with:  Spouse Do you feel safe going back to the place where you live?    Need for family participation in patient care:     Care giving concerns:  NO family or friends present at bedside during initial assessment.   Social Worker assessment / plan:  CSW spoke with pt at bedside to complete initial assessment. Pt is from home with spouse. Pt [facility] to go to [facility]. CSW explained [facility] is not contracted with [employer] so pt would have to pay 7800$ up front. Pt says her husband is coming to the hospital tonight and she will talk to him and for CSW to come back on 8/14.  Pt's daughter called and CSW explained the same thing regarding insurance pt's daughter wants CSW to send referral to [facility], however CSW does not have permission by pt to do this yet. CSW will follow up with pt tomorrow.  Employment status:    Insurance informationEducational psychologist PT Recommendations:  [place] / Referral to community resources:  [place]  Patient/Family's Response to care:  Pt verbalized understanding of CSW role and expressed appreciation for support. Pt denies any concern regarding pt care at this time.   Patient/Family's Understanding of  and Emotional Response to Diagnosis, Current Treatment, and Prognosis:  Pt understanding and realistic regarding physical limitations. Pt understands the need for SNF placement at d/c. Pt agreeable to SNF placement at d/c, at this time. Pt's responses emotionally appropriate during conversation with CSW. Pt denies any concern regarding treatment plan at this time. CSW will continue to provide support and facilitate d/c needs.   Emotional Assessment Appearance:  Appears stated age Attitude/Demeanor/Rapport:   (Patient was appropriate.) Affect (typically observed):  Accepting, Appropriate, Calm Orientation:  Oriented to Self, Oriented to Place, Oriented to  Time, Oriented to Situation Alcohol / Substance use:  Not Applicable Psych involvement (Current and /or in the community):  No (Comment)  Discharge Needs  Concerns to be addressed:  Care Coordination Readmission within the last 30 days:  No Current discharge risk:  Dependent with Mobility Barriers to Discharge:  Continued Medical Work up   [employer], LCSW 09/14/2016, 3:57 PM

## 2016-09-14 NOTE — Clinical Social Work Note (Signed)
CSW spoke with pt's daughter via telephone. Pt's daughter states pt is working hard and motivated to go to [employer] [facility], however [facility] is not contracted with [facility] [employer]. CSW explained this to pt's daughter and pt's daughter is going to call [facility] to confirm. Pt's daughter states pt does not want to go to any other facilities in [facility] and only wants [facility]. Pt's daughter will follow up with CSW this afternoon.   [place], [place]

## 2016-09-14 NOTE — Progress Notes (Signed)
  Echocardiogram 2D Echocardiogram has been performed.  Haley Graham Haley Graham 09/14/2016, 9:44 AM

## 2016-09-14 NOTE — NC FL2 (Signed)
[place] LEVEL OF CARE SCREENING TOOL     IDENTIFICATION  Patient Name: Haley Graham Birthdate: 1955-07-17 Sex: female Admission Date (Current Location): 09/12/2016  [facility] and [place] Number:  Herbalist and Address:  [facility] [place], [place] [address] [place],  [address]      Provider Number: [id number]  Attending Physician Name and Address:  Mariel Aloe, MD  Relative Name and Phone Number:       Current Level of Care: Hospital Recommended Level of Care: [place] Prior Approval Number:    Date Approved/Denied:   PASRR Number: [id number]  Discharge Plan: SNF    Current Diagnoses: Patient Active Problem List   Diagnosis Date Noted  . Protein-calorie malnutrition, severe 09/12/2016  . Sepsis ([place]) 09/19/2016  . Hypotension 09/20/2016  . AKI (acute kidney injury) ([place]) 09/24/2016  . Dehydration 10/02/2016  . Weakness 09/11/2016  . Anemia 09/08/2016  . Thrombocytopenia ([place]) 09/19/2016  . Abnormal computed tomography angiography (CTA) of abdomen and pelvis 09/10/2016  . Colitis: Probable 09/18/2016  . AAA (abdominal aortic aneurysm) ([place])   . Abdominal pain   . Ischemic enteritis ([place])   . Benign essential HTN   . Diabetes mellitus type 2 in obese ([place])   . Anxiety state   . Tachycardia   . Tachypnea   . Hyponatremia   . Leukocytosis   . Acute blood loss anemia   . Dissection of thoracoabdominal aorta ([place])   . Unstable angina pectoris ([place])   . SOB (shortness of breath)   . 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 Change in stool 05/28/2016  . Gastroparesis 11/19/2011  . Nausea 11/19/2011  . Dysphagia 11/11/2011  . GERD (gastroesophageal reflux disease) 11/11/2011    Orientation RESPIRATION BLADDER Height & Weight     Self, Situation, Place, Time  Normal Continent Weight:  (pt was nausious, spitting up, not able weigh, LPN, veriified) Height:  5\' 3"  (160  cm)  BEHAVIORAL SYMPTOMS/MOOD NEUROLOGICAL BOWEL NUTRITION STATUS      Continent  (Please see d/c summary)  AMBULATORY STATUS COMMUNICATION OF NEEDS Skin   Extensive Assist Verbally Surgical wounds (Closed incision left arm, Opsite Post-Op Visible, change PRN. Closed incision right groin, no dressing)                       Personal Care Assistance Level of Assistance  Feeding, Dressing, Bathing Bathing Assistance: Maximum assistance Feeding assistance: Limited assistance Dressing Assistance: Maximum assistance     Functional Limitations Info  Sight, Hearing, Speech Sight Info: Adequate Hearing Info: Adequate Speech Info: Adequate    SPECIAL CARE FACTORS FREQUENCY  PT (By licensed PT), OT (By licensed OT)     PT Frequency: 3x week OT Frequency: 3x week            Contractures Contractures Info: Not present    Additional Factors Info  Code Status, Allergies Code Status Info: Full Code Allergies Info: Penicillins, Ativan Lorazepam, Codeine, Reglan Metoclopramide           Current Medications (09/14/2016):  This is the current hospital active medication list Current Facility-Administered Medications  Medication Dose Route Frequency Provider Last Rate Last Dose  . acetaminophen (TYLENOL) tablet 650 mg  650 mg Oral Q6H PRN Eugenie Filler, MD       Or  . acetaminophen (TYLENOL) suppository 650 mg  650 mg Rectal Q6H PRN Eugenie Filler, MD      .  ALPRAZolam Duanne Moron) tablet 0.25-0.5 mg  0.25-0.5 mg Oral BID PRN Mariel Aloe, MD      . alum & mag hydroxide-simeth (MAALOX/MYLANTA) 200-200-20 MG/5ML suspension 30 mL  30 mL Oral BID PRN Eugenie Filler, MD   30 mL at 09/13/16 8675  . aspirin EC tablet 81 mg  81 mg Oral Daily Patrecia Pour, MD   81 mg at 09/14/16 0940  . ceFEPIme (MAXIPIME) 1 g in dextrose 5 % 50 mL IVPB  1 g Intravenous Q8H Otilio Miu, [place]   Stopped at 09/14/16 4492  . citalopram (CELEXA) tablet 40 mg  40 mg Oral QHS Eugenie Filler,  MD   40 mg at 09/13/16 2130  . feeding supplement (BOOST / RESOURCE BREEZE) liquid 1 Container  1 Container Oral TID BM Mariel Aloe, MD   1 Container at 09/14/16 0940  . fluticasone (FLONASE) 50 MCG/ACT nasal spray 2 spray  2 spray Each Nare Daily PRN Eugenie Filler, MD      . insulin aspart (novoLOG) injection 0-5 Units  0-5 Units Subcutaneous QHS Eugenie Filler, MD   Stopped at 09/08/2016 2212  . insulin aspart (novoLOG) injection 0-9 Units  0-9 Units Subcutaneous TID WC Mariel Aloe, MD      . levalbuterol Penne Lash) nebulizer solution 0.63 mg  0.63 mg Nebulization Q2H PRN Eugenie Filler, MD      . loperamide (IMODIUM) capsule 4 mg  4 mg Oral PRN Mariel Aloe, MD   4 mg at 09/13/16 2137  . metoprolol tartrate (LOPRESSOR) tablet 12.5 mg  12.5 mg Oral BID Mariel Aloe, MD   12.5 mg at 09/14/16 0940  . metroNIDAZOLE (FLAGYL) IVPB 500 mg  500 mg Intravenous Q8H Otilio Miu, [place]   Stopped at 09/14/16 1000  . ondansetron (ZOFRAN) injection 4 mg  4 mg Intravenous Q6H PRN Eugenie Filler, MD   4 mg at 09/13/16 0755  . ondansetron (ZOFRAN) tablet 4 mg  4 mg Oral Q8H PRN Eugenie Filler, MD   4 mg at 09/28/2016 2008  . pantoprazole (PROTONIX) injection 40 mg  40 mg Intravenous Q24H Eugenie Filler, MD   40 mg at 09/13/16 2130  . senna-docusate (Senokot-S) tablet 1 tablet  1 tablet Oral QHS PRN Eugenie Filler, MD      . sodium chloride flush (NS) 0.9 % injection 3 mL  3 mL Intravenous Q12H Eugenie Filler, MD   3 mL at 09/13/16 1000  . sodium phosphate (FLEET) 7-19 GM/118ML enema 1 enema  1 enema Rectal Once PRN Eugenie Filler, MD      . sorbitol 70 % solution 30 mL  30 mL Oral Daily PRN Eugenie Filler, MD      . tiotropium [facility]) inhalation capsule 18 mcg  18 mcg Inhalation Daily Eugenie Filler, MD   18 mcg at 09/13/16 0931  . traMADol (ULTRAM) tablet 50 mg  50 mg Oral Q6H PRN Schorr, Rhetta Mura, NP         Discharge Medications: Please see  discharge summary for a list of discharge medications.  Relevant Imaging Results:  Relevant Lab Results:   Additional Information SSN: [id number]  Eileen Stanford, LCSW

## 2016-09-15 DIAGNOSIS — R1084 Generalized abdominal pain: Secondary | ICD-10-CM

## 2016-09-15 DIAGNOSIS — R748 Abnormal levels of other serum enzymes: Secondary | ICD-10-CM

## 2016-09-15 LAB — BASIC METABOLIC PANEL
ANION GAP: 5 (ref 5–15)
BUN: 16 mg/dL (ref 6–20)
CALCIUM: 6.7 mg/dL — AB (ref 8.9–10.3)
CO2: 18 mmol/L — AB (ref 22–32)
CREATININE: 1.12 mg/dL — AB (ref 0.44–1.00)
Chloride: 109 mmol/L (ref 101–111)
GFR calc Af Amer: >60 mL/min (ref >60)
GFR, EST NON AFRICAN AMERICAN: 52 mL/min — AB (ref >60)
Glucose, Bld: 98 mg/dL (ref 65–99)
Potassium: 3.2 mmol/L — ABNORMAL LOW (ref 3.5–5.1)
SODIUM: 132 mmol/L — AB (ref 135–145)

## 2016-09-15 LAB — GLUCOSE, CAPILLARY
GLUCOSE-CAPILLARY: 96 mg/dL (ref 65–99)
GLUCOSE-CAPILLARY: 124 mg/dL — AB (ref 65–99)
GLUCOSE-CAPILLARY: 144 mg/dL — AB (ref 65–99)
Glucose-Capillary: 141 mg/dL — ABNORMAL HIGH (ref 65–99)
Glucose-Capillary: 134 mg/dL — ABNORMAL HIGH (ref 65–99)
Glucose-Capillary: 126 mg/dL — ABNORMAL HIGH (ref 65–99)

## 2016-09-15 LAB — HEPATITIS PANEL, ACUTE
HCV Ab: 0.1 s/co ratio (ref 0.0–0.9)
HEP B C IGM: NEGATIVE
HEP B S AG: NEGATIVE
Hep A IgM: NEGATIVE

## 2016-09-15 LAB — HEPATIC FUNCTION PANEL
ALK PHOS: 202 U/L — AB (ref 38–126)
ALT: 11 U/L — ABNORMAL LOW (ref 14–54)
AST: 18 U/L (ref 15–41)
Albumin: 1.1 g/dL — ABNORMAL LOW (ref 3.5–5.0)
BILIRUBIN DIRECT: 0.3 mg/dL (ref 0.1–0.5)
BILIRUBIN INDIRECT: 0.5 mg/dL (ref 0.3–0.9)
BILIRUBIN TOTAL: 0.8 mg/dL (ref 0.3–1.2)
Total Protein: 4.1 g/dL — ABNORMAL LOW (ref 6.5–8.1)

## 2016-09-15 LAB — CBC
HCT: 26.2 % — ABNORMAL LOW (ref 36.0–46.0)
Hemoglobin: 8.6 g/dL — ABNORMAL LOW (ref 12.0–15.0)
MCH: 31.8 pg (ref 26.0–34.0)
MCHC: 33.2 g/dL (ref 30.0–36.0)
MCV: 95.6 fL (ref 78.0–100.0)
PLATELETS: 66 10*3/uL — AB (ref 150–400)
RBC: 2.74 MIL/uL — ABNORMAL LOW (ref 3.87–5.11)
RDW: 18.1 % — ABNORMAL HIGH (ref 11.5–15.5)
WBC: 16.7 10*3/uL — ABNORMAL HIGH (ref 4.0–10.5)

## 2016-09-15 LAB — SAVE SMEAR

## 2016-09-15 MED ORDER — MORPHINE SULFATE (PF) 2 MG/ML IV SOLN
2.0000 mg | INTRAVENOUS | Status: DC | PRN
  Administered 2016-09-16 – 2016-09-17 (×3): 2 mg via INTRAVENOUS
  Filled 2016-09-15 (×4): qty 1

## 2016-09-15 MED ORDER — POTASSIUM CHLORIDE CRYS ER 20 MEQ PO TBCR
20.0000 meq | EXTENDED_RELEASE_TABLET | Freq: Once | ORAL | Status: AC
  Administered 2016-09-15: 20 meq via ORAL
  Filled 2016-09-15: qty 1

## 2016-09-15 NOTE — Progress Notes (Addendum)
Physical Therapy Treatment Patient Details Name: Haley Graham MRN: [id number] DOB: October 22, 1955 Today's Date: 09/15/2016    History of Present Illness This 61 y.o. female admitted with sepsis, hypotension, dehydration, amemia, COPD, AKI due to poor oral intake and hypotension/dehydration.  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 Pt required max encouragement to mobilize and repeatedly spoke about need for pain medicine although not time. This pt is familiar to this therapist from previous admission. Pt would likely benefit from psych consult. Continue to recommend SNF for further skilled PT services to maximize independence and safety with mobility.    Follow Up Recommendations  SNF     Equipment Recommendations  Other (comment) (TBA)    Recommendations for Other Services       Precautions / Restrictions Precautions Precautions: Fall    Mobility  Bed Mobility Overal bed mobility: Needs Assistance Bed Mobility: Supine to Sit;Sit to Supine     Supine to sit: Min assist Sit to supine: Mod assist   General bed mobility comments: assist needed to elevate trunk into sitting and to bring bilat LE into bed; cues for sequencing and technique; use of bed rail  Transfers Overall transfer level: Needs assistance Equipment used: Rolling walker (2 wheeled) Transfers: Sit to/from Stand Sit to Stand: Min assist;+2 safety/equipment Stand pivot transfers: Min assist;+2 safety/equipment       General transfer comment: assist to steady and +2 for safety due to behavioral issues  Ambulation/Gait Ambulation/Gait assistance: Min assist;Max assist;+2 safety/equipment Ambulation Distance (Feet): 5 Feet Assistive device: Rolling walker (2 wheeled) Gait  Pattern/deviations: Step-through pattern;Decreased stride length     General Gait Details: min A required to steady for ~5 ft and then pt closed her eyes and took hands off of RW and began leaning on therapist; pt able to put bilat UE back on RW and assisted minimally to maintain balance with cues from therapist and chair brought up behind pt; +2 for safety required due to pt's behavior; all vitals and blood sugar checked and all WNL    Stairs            Wheelchair Mobility    Modified Rankin (Stroke Patients Only)       Balance Overall balance assessment: Needs assistance Sitting-balance support: Feet supported Sitting balance-Leahy Scale: Poor Sitting balance - Comments: Requires UE support    Standing balance support: Single extremity supported;Bilateral upper extremity supported Standing balance-Leahy Scale: Poor Standing balance comment: requires UE support                             Cognition Arousal/Alertness: Awake/alert Behavior During Therapy: Flat affect Overall Cognitive Status: Impaired/Different from baseline Area of Impairment: Attention;Following commands;Safety/judgement;Problem solving                   Current Attention Level: Sustained     Safety/Judgement: Decreased awareness of safety            Exercises      General Comments        Pertinent Vitals/Pain Pain Assessment: Faces Faces Pain Scale: Hurts little more Pain Location: abdomen Pain Descriptors / Indicators: Grimacing;Guarding Pain Intervention(s): Monitored during session;Premedicated before session;Repositioned    Home Living  Prior Function            PT Goals (current goals can now be found in the care plan section) Acute Rehab PT Goals PT Goal Formulation: With patient Time For Goal Achievement: 09/25/16 Potential to Achieve Goals: Fair Progress towards PT goals: Progressing toward goals    Frequency    Min  3X/week      PT Plan Current plan remains appropriate    Co-evaluation              AM-PAC PT "6 Clicks" Daily Activity  Outcome Measure  Difficulty turning over in bed (including adjusting bedclothes, sheets and blankets)?: Total Difficulty moving from lying on back to sitting on the side of the bed? : Total Difficulty sitting down on and standing up from a chair with arms (e.g., wheelchair, bedside commode, etc,.)?: Total Help needed moving to and from a bed to chair (including a wheelchair)?: A Little Help needed walking in hospital room?: A Lot Help needed climbing 3-5 steps with a railing? : A Lot 6 Click Score: 10    End of Session Equipment Utilized During Treatment: Gait belt Activity Tolerance: Patient limited by fatigue Patient left: in bed;with call bell/phone within reach Nurse Communication: Mobility status PT Visit Diagnosis: Muscle weakness (generalized) (M62.81);Other abnormalities of gait and mobility (R26.89);Unsteadiness on feet (R26.81);Adult, failure to thrive (R62.7)     Time: 1420-1500 PT Time Calculation (min) (ACUTE ONLY): 40 min  Charges:  $Gait Training: 8-22 mins $Therapeutic Activity: 23-37 mins                    G Codes:       Earney Navy, PTA Pager: [phone]     Darliss Cheney 09/15/2016, 4:09 PM

## 2016-09-15 NOTE — Care Management Note (Addendum)
Case Management Note Previous CM note initiated by Carles Collet, RN 09/11/2016, 1:50 PM  Patient Details  Name: Haley Graham MRN: [id number] Date of Birth: [birth year]  Subjective/Objective:                 Patient discharged 7/21 to home w [facility] for [facility] services. Requested from [facility] if patient was still active, will update note as info becomes available.   Action/Plan:   Expected Discharge Date:   (unknown)               Expected Discharge Plan:  [place]  In-House Referral:     Discharge planning Services  CM Consult  Post Acute Care Choice:  Home Health, Resumption of Svcs/PTA Provider Choice offered to:  Patient, Adult Children  DME Arranged:    DME Agency:     HH Arranged:    [place] Agency:  [place]  Status of Service:  In process, will continue to follow  If discussed at Long Length of Stay Meetings, dates discussed:    Discharge Disposition:   Additional Comments:  09/14/16- 1025- Marvetta Gibbons RN, CM- spoke with Santiago Glad at [facility]- pt is active with them for HHRN/PT- however received call from pt's daughter who wants to look at [facility]- will let CSW know - per PT eval recommendations for SNF- CSW will follow for placement needs.   Dawayne Patricia, RN 09/15/2016, 10:24 AM

## 2016-09-15 NOTE — Clinical Social Work Note (Addendum)
CSW spoke with pt at bedside. Pt is agreeable for referral to be sent to [place]. Pt gave CSW permission to speak with husband regarding any plan from this point forward.   CSW spoke with [facility], they do have bed availability. However, pt needs updated PT and OT notes for insurance auth.   [place], [place]

## 2016-09-15 NOTE — Clinical Social Work Placement (Signed)
   CLINICAL SOCIAL WORK PLACEMENT  NOTE  Date:  09/15/2016  Patient Details  Name: Haley Graham MRN: [id number] Date of Birth: 1955/06/28  Clinical Social Work is seeking post-discharge placement for this patient at the [place] level of care (*CSW will initial, date and re-position this form in  chart as items are completed):      Patient/family provided with [place] Work Department's list of facilities offering this level of care within the geographic area requested by the patient (or if unable, by the patient's family).  Yes   Patient/family informed of their freedom to choose among providers that offer the needed level of care, that participate in Medicare, Medicaid or managed care program needed by the patient, have an available bed and are willing to accept the patient.      Patient/family informed of [facility]'s ownership interest in [facility] and [facility], as well as of the fact that they are under no obligation to receive care at these facilities.  PASRR submitted to EDS on       PASRR number received on 09/14/16     Existing PASRR number confirmed on       FL2 transmitted to all facilities in geographic area requested by pt/family on 09/14/16     FL2 transmitted to all facilities within larger geographic area on       Patient informed that his/her managed care company has contracts with or will negotiate with certain facilities, including the following:        Yes   Patient/family informed of bed offers received.  Patient chooses bed at       Physician recommends and patient chooses bed at      Patient to be transferred to   on  .  Patient to be transferred to facility by       Patient family notified on   of transfer.  Name of family member notified:        PHYSICIAN       Additional Comment:    _______________________________________________ Eileen Stanford, LCSW 09/15/2016, 4:21 PM

## 2016-09-15 NOTE — Clinical Social Work Note (Signed)
CSW attempted to meet with pt at bedside however pt screaming in pain. Pt's daughter speaking with RN. CSW will come back.   [place], [place]

## 2016-09-15 NOTE — Progress Notes (Signed)
Pharmacy Antibiotic Note  Haley Graham is a 61 y.o. female admitted on day # 7 antibiotics for sepsis due to colitis/enteritis.Marland Kitchen  Pharmacy has been consulted for Cefepime and Metronidazole dosing.  Day # 7 Flagyl, #4 Cefepime, after Vanc and Levaquin.   Afebrile, WBC 16.7. Renal function about the same. Blood cultures negative, GI panel negative. Noted improved.  Plan:  Continue Cefepime 1gm IV q8hrs.  Continue Metronidazole 500 mg IV q8hrs.  Follow renal function, clinical status.  Follow for length of therapy, possible de-escalation.  Height: 5\' 3"  (160 cm) Weight: 175 lb 8 oz (79.6 kg) IBW/kg (Calculated) : 52.4  Temp (24hrs), Avg:97.7 F (36.5 C), Min:97.4 F (36.3 C), Max:97.8 F (36.6 C)   Recent Labs Lab 10/02/2016 1449 09/16/2016 2034 09/10/16 0558 09/10/16 0949 09/11/16 0714 09/12/16 0707 09/13/16 1755 09/14/16 0728 09/15/16 0210  WBC  --   --   --  13.8* 14.5* 14.2* 16.9* 21.6* 16.7*  CREATININE  --   --   --  1.13* 1.17* 1.13* 1.27* 1.21* 1.12*  LATICACIDVEN 3.24* 2.65* 1.3 1.3  --   --   --   --   --     Estimated Creatinine Clearance: 53.4 mL/min (A) (by C-G formula based on SCr of 1.12 mg/dL (H)).    Allergies  Allergen Reactions  . Penicillins Rash and Other (See Comments)    PATIENT HAS HAD A PCN REACTION WITH IMMEDIATE RASH, FACIAL/TONGUE/THROAT SWELLING, SOB, OR LIGHTHEADEDNESS WITH HYPOTENSION:  #  #  #  YES  #  #  #   Has patient had a PCN reaction causing severe rash involving mucus membranes or skin necrosis: no Has patient had a PCN reaction that required hospitalization no Has patient had a PCN reaction occurring within the last 10 years:no If all of the above answers are "NO", then may proceed with Cephalosporin use.  . Ativan [Lorazepam] Other (See Comments)    Makes pt very confused and more agitated, irritable.  . Codeine Nausea And Vomiting  . Reglan [Metoclopramide] Other (See Comments)    TACHYCARDIA    Antimicrobials this  admission:  Vancomycin 8/8 >>8/11  Levaquin 8/8 >> 8/10  Flagyl 8/8>>  Cefepime 8/10 >>  Dose adjustments this admission:  8/11:  Cefepime changed from 1 gm IV q24h to 1 gm IV q8hrs   Microbiology results: 8/8 blood x 2: negative 8/8 urine: negative 8/8 GI panel: negative 8/8 Cdiff PCR: negative  Thank you for allowing pharmacy to be a part of this patient's care.  Arty Baumgartner, [place] Pager: [phone] 09/15/2016 4:48 PM

## 2016-09-15 NOTE — Progress Notes (Signed)
Pt. Daughter at bedside requesting to be informed about patients status by MD. MD paged and made aware will call daughter at later time. Know daughter is requesting more pain meds for mom, who now is stating she is pain. PRN meds given at 1006. MD made aware no new orders placed.

## 2016-09-15 NOTE — Progress Notes (Signed)
PROGRESS NOTE    Haley Graham  KXF:[id number] DOB: [birth year] DOA: 10/02/2016 PCP: Glenda Chroman, MD   Brief Narrative: Haley Graham is a 61 y.o. 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 She presented with weakness and met sepsis criteria with concern for enteritis. Antibiotics started. Cultures pending.   Assessment & Plan:   Principal Problem:   Sepsis ([place]) Active Problems:   GERD (gastroesophageal reflux disease)   Dissection of thoracoabdominal aorta (HCC)   AAA (abdominal aortic aneurysm) (HCC)   Benign essential HTN   Diabetes mellitus type 2 in obese (HCC)   Anxiety state   Hyponatremia   Leukocytosis   Hypotension   AKI (acute kidney injury) ([place])   Dehydration   Weakness   Anemia   Thrombocytopenia (HCC)   Abnormal computed tomography angiography (CTA) of abdomen and pelvis   Colitis: Probable   Protein-calorie malnutrition, severe   Sepsis Secondary to colitis/enteritis. Physiology improved with IV fluids and antibiotics. Has some tachycardia. GI pathogen panel and C. Diff negative. Urine/blood culture negative. -continue Flagyl and Cefepime  Hypotension Improved with IV fluids which has made her anasarca worse. Troponin negative. Albumin extremely low. -monitor BP  Dehydration Poor oral intake -encourage oral intake -dietitian consulted: protein supplementation  Normocytic anemia Significant drop from yesterday, but stable from prior days. No evidence of bleeding. -repeat CBC  COPD Stable. -continue nebulizers  Acute kidney injury Secondary to hypotension. Improved with diuresis.  Anasarca Secondary to IV fluids. Recent echo  significant for grade 1 diastolic dysfunction. Repeat echocardiogram significant for an EF of 65-70% with grade 1 diastolic dysfunction and trace mitral regurg and tricuspid regurg -Lasix  Oliguria Improvement with diuresis -strict in/out  Hematuria Hyperbilirubinemia Tea colored urine. Decreased urine output initially which is now improved. Liver unremarkable for acute process. Vascular surgery without recommendations for thrombosis on consultation. -Monitor  Hyponatremia Mild. Likely secondary to third spacing.  Elevated alkaline phosphatase S/p cholecystecomy. GGT elevated. Alkaline phosphatase improving. RUQ unremarkable except for fatty liver  Abnormal CT angiogram of chest/abdomen/pelvis Thrombus of left renal artery. Vascular surgery consulted with no recommendations for acute management.  Thrombocytopenia Presumed secondary to acute illness. Has remained low and stable fluctuating between 60,000 and 80,000. Discussed with hematologist, Dr. Julien Nordmann who recommends continued surveillance. Can follow-up as an outpatient if not improved by discharge. -Pathology smear review pending  GERD Stable. -continue PPI  Anxiety Stable. Patient would rather have Xanax -Xanax -discontinue Klonipin  [place] type B aortic dissection Patient is s/p stent graft placement on previous admission. -continue metoprolol at reduce dose  Tachycardia Rebound secondary to abrupt cessation of beta-blocker. Resolved. -continue metoprolol at reduced dose of 12.39m BID  Leukocytosis Has remained stable. Patient without new source of infection. Afebrile. Blood cultures negative. Pro calcitonin 0.24 on admission. -monitor  Hypokalemia -Potassium supplementation   DVT prophylaxis: SCDs Code Status: Full code Family Communication: None at bedside Disposition Plan: Discharge pending medical improvement   Consultants:   Vascular sugery  Procedures:   ABI: 1.00 (right); 0.77  (left)  Antimicrobials:  Levaquin (8/8>>8/10)  Vancomycin (8/8>>8/10)  Metronidazole (8/8>>  Cefepime (8/10>>   Subjective: Nausea. Loose bowel movements yesterday.  Objective: Vitals:   09/14/16 2019 09/15/16 0534 09/15/16 0927 09/15/16 0950  BP: 109/63   (!) 152/90  Pulse:  81   85  Resp: '18 18 20   ' Temp: 97.8 F (36.6 C) 97.8 F (36.6 C) (!) 97.4 F (36.3 C)   TempSrc: Oral Oral Oral   SpO2: 98%     Weight:  79.6 kg (175 lb 8 oz)    Height:        Intake/Output Summary (Last 24 hours) at 09/15/16 1214 Last data filed at 09/15/16 0601  Gross per 24 hour  Intake              350 ml  Output             1150 ml  Net             -800 ml   Filed Weights   09/12/16 0631 09/13/16 0353 09/15/16 0534  Weight: 85.4 kg (188 lb 4.4 oz) 79.4 kg (175 lb) 79.6 kg (175 lb 8 oz)    Examination:  General exam: Calm and comfortable. Respiratory system: Clear to auscultation bilaterally. Unlabored work of breathing. No wheezing or rales. Cardiovascular system: Regular rate and rhythm. Normal S1 and S2. No heart murmurs present. No extra heart sounds Gastrointestinal system: Soft, mildly tender in epigastrum, non-distended, no guarding, no rebound Central nervous system: Alert, oriented. Extremities: 2+ edema in legs bilaterally. No calf tenderness Skin: No cyanosis. No rashes Psychiatry: Judgement and insight appear normal. Mood & affect flat and depressed. withdrawn.     Data Reviewed: I have personally reviewed following labs and imaging studies  CBC:  Recent Labs Lab 09/20/2016 1139 09/10/16 0949 09/11/16 0714 09/12/16 0707 09/13/16 1755 09/14/16 0728 09/15/16 0210  WBC 21.0* 13.8* 14.5* 14.2* 16.9* 21.6* 16.7*  NEUTROABS 18.2* 11.4*  --   --   --   --   --   HGB 10.1* 9.9* 10.1* 8.8* 9.7* 11.3* 8.6*  HCT 30.3* 29.8* 28.4* 26.1* 29.9* 35.2* 26.2*  MCV 94.4 92.5 95.9 97.8 89.8 93.6 95.6  PLT 84* 76* 81* 62* 89* 81* 66*   Basic Metabolic Panel:  Recent  Labs Lab 09/10/16 0949 09/11/16 0714 09/12/16 0707 09/13/16 1755 09/14/16 0728 09/15/16 0210  NA 135 134* 133* 133* 132* 132*  K 4.4 4.8 3.8 3.9 4.0 3.2*  CL 107 108 108 108 107 109  CO2 22 19* 20* 19* 17* 18*  GLUCOSE 67 110* 104* 126* 110* 98  BUN 23* 21* '17 17 16 16  ' CREATININE 1.13* 1.17* 1.13* 1.27* 1.21* 1.12*  CALCIUM 6.6* 6.9* 7.0* 7.0* 7.1* 6.7*  MG 1.5*  --   --   --   --   --    GFR: Estimated Creatinine Clearance: 53.4 mL/min (A) (by C-G formula based on SCr of 1.12 mg/dL (H)). Liver Function Tests:  Recent Labs Lab 09/07/2016 1139 09/10/16 0949 09/12/16 1059 09/13/16 1755 09/15/16 0834  AST 21 19  --  25 18  ALT 15 15  --  13* 11*  ALKPHOS 184* 191*  --  270* 202*  BILITOT 0.6 0.8 0.8 0.9 0.8  PROT 4.7* 4.7*  --  4.9* 4.1*  ALBUMIN 1.0* <1.0*  --  1.3* 1.1*    Recent Labs Lab 09/23/2016 1139  LIPASE 14   No results for input(s): AMMONIA in the last 168 hours. Coagulation Profile:  Recent Labs Lab 09/10/16 0949  INR 1.29   Cardiac Enzymes:  Recent Labs Lab 09/02/2016 1139 09/10/16 0949 09/13/16 1755  CKTOTAL  --   --  13*  TROPONINI <0.03 <0.03  --    BNP (  last 3 results) No results for input(s): PROBNP in the last 8760 hours. HbA1C: No results for input(s): HGBA1C in the last 72 hours. CBG:  Recent Labs Lab 09/14/16 1625 09/14/16 2037 09/15/16 0542 09/15/16 1007 09/15/16 1158  GLUCAP 84 128* 96 124* 141*   Lipid Profile: No results for input(s): CHOL, HDL, LDLCALC, TRIG, CHOLHDL, LDLDIRECT in the last 72 hours. Thyroid Function Tests: No results for input(s): TSH, T4TOTAL, FREET4, T3FREE, THYROIDAB in the last 72 hours. Anemia Panel: No results for input(s): VITAMINB12, FOLATE, FERRITIN, TIBC, IRON, RETICCTPCT in the last 72 hours. Sepsis Labs:  Recent Labs Lab 09/29/2016 1449 09/19/2016 2034 09/10/16 0558 09/10/16 0949  PROCALCITON  --   --   --  0.24  LATICACIDVEN 3.24* 2.65* 1.3 1.3    Recent Results (from the past 240  hour(s))  Gastrointestinal Panel by PCR , Stool     Status: None   Collection Time: 09/25/2016  1:30 AM  Result Value Ref Range Status   Campylobacter species NOT DETECTED NOT DETECTED Final   Plesimonas shigelloides NOT DETECTED NOT DETECTED Final   Salmonella species NOT DETECTED NOT DETECTED Final   Yersinia enterocolitica NOT DETECTED NOT DETECTED Final   Vibrio species NOT DETECTED NOT DETECTED Final   Vibrio cholerae NOT DETECTED NOT DETECTED Final   Enteroaggregative E coli (EAEC) NOT DETECTED NOT DETECTED Final   Enteropathogenic E coli (EPEC) NOT DETECTED NOT DETECTED Final   Enterotoxigenic E coli (ETEC) NOT DETECTED NOT DETECTED Final   Shiga like toxin producing E coli (STEC) NOT DETECTED NOT DETECTED Final   Shigella/Enteroinvasive E coli (EIEC) NOT DETECTED NOT DETECTED Final   Cryptosporidium NOT DETECTED NOT DETECTED Final   Cyclospora cayetanensis NOT DETECTED NOT DETECTED Final   Entamoeba histolytica NOT DETECTED NOT DETECTED Final   Giardia lamblia NOT DETECTED NOT DETECTED Final   Adenovirus F40/41 NOT DETECTED NOT DETECTED Final   Astrovirus NOT DETECTED NOT DETECTED Final   Norovirus GI/GII NOT DETECTED NOT DETECTED Final   Rotavirus A NOT DETECTED NOT DETECTED Final   Sapovirus (I, II, IV, and V) NOT DETECTED NOT DETECTED Final  C difficile quick scan w PCR reflex     Status: None   Collection Time: 09/20/2016  1:30 AM  Result Value Ref Range Status   C Diff antigen NEGATIVE NEGATIVE Final   C Diff toxin NEGATIVE NEGATIVE Final   C Diff interpretation No C. difficile detected.  Final  Blood Culture (routine x 2)     Status: None   Collection Time: 09/14/2016  2:09 PM  Result Value Ref Range Status   Specimen Description BLOOD RIGHT ANTECUBITAL  Final   Special Requests   Final    BOTTLES DRAWN AEROBIC AND ANAEROBIC Blood Culture adequate volume   Culture   Final    NO GROWTH 5 DAYS Performed at [facility], [address], [place], [place] [address]     Report Status 09/14/2016 FINAL  Final  Culture, Urine     Status: None   Collection Time: 09/15/2016  9:40 PM  Result Value Ref Range Status   Specimen Description URINE, CLEAN CATCH  Final   Special Requests NONE  Final   Culture   Final    NO GROWTH Performed at [facility], [place] [address], [place],  [address]    Report Status 09/11/2016 FINAL  Final         Radiology Studies: US Abdomen Limited Ruq  Result Date: 09/14/2016 CLINICAL  DATA:  Elevated liver enzymes EXAM: ULTRASOUND ABDOMEN LIMITED RIGHT UPPER QUADRANT COMPARISON:  None. FINDINGS: Gallbladder: Surgically absent. Common bile duct: Diameter: 4 mm. No intrahepatic or extrahepatic biliary duct dilatation. Liver: No focal lesion identified. Liver echogenicity is increased diffusely. A slight amount of ascites is noted. There is a right pleural effusion. IMPRESSION: 1.  Gallbladder absent. 2. Diffuse increase in liver echogenicity, a finding most likely indicative of hepatic steatosis. While no focal liver lesions are evident on this study, it must be cautioned that the sensitivity of ultrasound for detection of focal liver lesions is diminished significantly in this circumstance. 3.  Trace ascites. 4.  Right pleural effusion. Electronically Signed   By: Lowella Grip III M.D.   On: 09/14/2016 10:07        Scheduled Meds: . aspirin EC  81 mg Oral Daily  . citalopram  40 mg Oral QHS  . feeding supplement (ENSURE ENLIVE)  237 mL Oral TID BM  . furosemide  20 mg Oral Daily  . insulin aspart  0-5 Units Subcutaneous QHS  . insulin aspart  0-9 Units Subcutaneous TID WC  . metoprolol tartrate  12.5 mg Oral BID  . pantoprazole (PROTONIX) IV  40 mg Intravenous Q24H  . sodium chloride flush  3 mL Intravenous Q12H  . tiotropium  18 mcg Inhalation Daily   Continuous Infusions: . ceFEPime (MAXIPIME) IV Stopped (09/15/16 0631)  . metronidazole Stopped (09/15/16 1049)     LOS: 6 days     Cordelia Poche,  MD [facility] 09/15/2016, 12:14 PM Pager: [phone]  If 7PM-7AM, please contact night-coverage [email or web address] Password [facility] 09/15/2016, 12:14 PM

## 2016-09-16 DIAGNOSIS — R310 Gross hematuria: Secondary | ICD-10-CM

## 2016-09-16 LAB — URINALYSIS, ROUTINE W REFLEX MICROSCOPIC
GLUCOSE, UA: NEGATIVE mg/dL
KETONES UR: 15 mg/dL — AB
NITRITE: POSITIVE — AB
PROTEIN: 100 mg/dL — AB
Specific Gravity, Urine: >1.03 — ABNORMAL HIGH (ref 1.005–1.030)
pH: 6 (ref 5.0–8.0)

## 2016-09-16 LAB — BASIC METABOLIC PANEL
ANION GAP: 9 (ref 5–15)
BUN: 18 mg/dL (ref 6–20)
CALCIUM: 6.9 mg/dL — AB (ref 8.9–10.3)
CO2: 16 mmol/L — AB (ref 22–32)
Chloride: 105 mmol/L (ref 101–111)
Creatinine, Ser: 1.33 mg/dL — ABNORMAL HIGH (ref 0.44–1.00)
GFR, EST AFRICAN AMERICAN: 49 mL/min — AB (ref >60)
GFR, EST NON AFRICAN AMERICAN: 42 mL/min — AB (ref >60)
Glucose, Bld: 107 mg/dL — ABNORMAL HIGH (ref 65–99)
POTASSIUM: 4.4 mmol/L (ref 3.5–5.1)
Sodium: 130 mmol/L — ABNORMAL LOW (ref 135–145)

## 2016-09-16 LAB — GLUCOSE, CAPILLARY
Glucose-Capillary: 108 mg/dL — ABNORMAL HIGH (ref 65–99)
Glucose-Capillary: 135 mg/dL — ABNORMAL HIGH (ref 65–99)
Glucose-Capillary: 106 mg/dL — ABNORMAL HIGH (ref 65–99)
Glucose-Capillary: 126 mg/dL — ABNORMAL HIGH (ref 65–99)

## 2016-09-16 LAB — URINALYSIS, MICROSCOPIC (REFLEX): SQUAMOUS EPITHELIAL / LPF: NONE SEEN

## 2016-09-16 LAB — CBC
HEMATOCRIT: 26 % — AB (ref 36.0–46.0)
HEMOGLOBIN: 9.1 g/dL — AB (ref 12.0–15.0)
MCH: 33.6 pg (ref 26.0–34.0)
MCHC: 35 g/dL (ref 30.0–36.0)
MCV: 95.9 fL (ref 78.0–100.0)
Platelets: 115 10*3/uL — ABNORMAL LOW (ref 150–400)
RBC: 2.71 MIL/uL — AB (ref 3.87–5.11)
RDW: 20.5 % — ABNORMAL HIGH (ref 11.5–15.5)
WBC: 26.9 10*3/uL — ABNORMAL HIGH (ref 4.0–10.5)

## 2016-09-16 MED ORDER — PRO-STAT SUGAR FREE PO LIQD
30.0000 mL | Freq: Two times a day (BID) | ORAL | Status: DC
  Administered 2016-09-16 – 2016-09-24 (×7): 30 mL via ORAL
  Filled 2016-09-16 (×17): qty 30

## 2016-09-16 MED ORDER — ALPRAZOLAM 0.25 MG PO TABS
0.2500 mg | ORAL_TABLET | Freq: Once | ORAL | Status: AC
  Administered 2016-09-16: 0.25 mg via ORAL
  Filled 2016-09-16: qty 1

## 2016-09-16 MED ORDER — FUROSEMIDE 40 MG PO TABS
40.0000 mg | ORAL_TABLET | Freq: Every day | ORAL | Status: DC
  Administered 2016-09-17: 40 mg via ORAL
  Filled 2016-09-16: qty 1

## 2016-09-16 NOTE — Progress Notes (Signed)
PROGRESS NOTE    LESA Graham  QAS:[id number] DOB: Oct 04, 1955 DOA: 09/02/2016 PCP: Glenda Chroman, MD   Brief Narrative: Haley Graham is a 61 y.o. 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 She presented with weakness and met sepsis criteria with concern for enteritis. Antibiotics started. Patient has worsening Cr and Leukocytosis. Complaining of Nausea and Abdominal Pain so will re-culture and repeat Imaging.   Assessment & Plan:   Principal Problem:   Sepsis ([place]) Active Problems:   GERD (gastroesophageal reflux disease)   Dissection of thoracoabdominal aorta (HCC)   AAA (abdominal aortic aneurysm) (HCC)   Benign essential HTN   Diabetes mellitus type 2 in obese (HCC)   Anxiety state   Hyponatremia   Leukocytosis   Hypotension   AKI (acute kidney injury) ([place])   Dehydration   Weakness   Anemia   Thrombocytopenia (HCC)   Abnormal computed tomography angiography (CTA) of abdomen and pelvis   Colitis: Probable   Protein-calorie malnutrition, severe  Sepsis -Suspected Secondary to colitis/enteritis.  -WBC worsening and went from 16.7 -> 26.9 -Physiology improved with IV fluids and antibiotics. Has some tachycardia.  -GI pathogen panel and C. Diff negative.  -Initial Urine/blood culture negative. Repeat U/A shows evidence of UTI with Many bacteria, Large Hb, Trace Leukocytes, Positive Nitrites, TNTC RBC, TNTC WBC -continue Flagyl and Cefepime for now and may need to escalate -Repeat Blood Cx -Repeat CT Chest/Abd/Pelvis w/o Contrast -Continue to Monitor   Hypotension -Improved with IV fluids which has made her anasarca worse. Troponin negative.  Albumin extremely low. -monitor BP  Dehydration/Poor oral intake -encourage oral intake -dietitian consulted: protein supplementation as patient has Significant Hypoalbuminemia and LE swelling   Normocytic anemia No evidence of bleeding. Hb/Hct went from 8.6/26.2 -> 9.1/26.0 -repeat CBC in AM   COPD Stable. -continue nebulizers  Acute kidney injury -Suspected Secondary to hypotension. -Increased Diurectics -Has  -BUN/Cr went from 16/1.12 -> 18/1.33 -Continue to Monitor and Avoid Nephrotoxics -Repeat CMP in AM  Anasarca -Secondary to IV fluids. Recent echo significant for grade 1 diastolic dysfunction. Repeat echocardiogram significant for an EF of 65-70% with grade 1 diastolic dysfunction and trace mitral regurg and tricuspid regurg -Lasix increased   Oliguria -Improvement with diuresis -strict in/out  Hematuria/ Hyperbilirubinemia Tea colored urine. Decreased urine output initially which is now improved. Liver unremarkable for acute process. Vascular surgery without recommendations for thrombosis on consultation. -Monitor and repeated U/A today  -May need to discuss with Urology  Hyponatremia -Mild. Likely secondary to third spacing. -Continue to Monitor   Elevated alkaline phosphatase S/p cholecystecomy. GGT elevated. Alkaline phosphatase improving. RUQ unremarkable except for fatty liver  Abnormal CT angiogram of chest/abdomen/pelvis -Thrombus of left renal artery. Vascular surgery consulted with no recommendations for acute management.  Thrombocytopenia -Presumed secondary to acute illness. Has remained low and stable fluctuating between 60,000 and 80,000 but now improved to 115,000. Discussed with hematologist, Dr. Julien Nordmann who recommends continued surveillance.  -Can follow-up as an outpatient if not improved by discharge. -Pathology smear review pending  GERD Stable. -continue PPI  Anxiety -Stable. -Xanax -discontinue Klonipin  [place]  type B aortic dissection -Patient is s/p stent graft placement on previous admission. -continue metoprolol at reduce dose  Tachycardia -Rebound secondary to abrupt cessation of beta-blocker. -continue  metoprolol at reduced dose of 12.57m BID  Leukocytosis -Patient without new source of infection but will reculture. Afebrile.  -Inital Blood cultures negative. Pro calcitonin 0.24 on admission. -Repeat Imaging   Hypokalemia -K+ Improved   DVT prophylaxis: SCDs Code Status: FULL CODE Family Communication: Discussed with Husband over the Phone Disposition Plan: Remain Inpatient .   Consultants:   Vascular Surgery  Case was discussed with Dr. MJulien Nordmann   Procedures:  ABI   Antimicrobials:  Anti-infectives    Start     Dose/Rate Route Frequency Ordered Stop   09/12/16 1400  ceFEPIme (MAXIPIME) 1 g in dextrose 5 % 50 mL IVPB     1 g 100 mL/hr over 30 Minutes Intravenous Every 8 hours 09/12/16 1127     09/11/16 1600  metroNIDAZOLE (FLAGYL) IVPB 500 mg     500 mg 100 mL/hr over 60 Minutes Intravenous Every 8 hours 09/11/16 0902     09/11/16 1400  ceFEPIme (MAXIPIME) 1 g in dextrose 5 % 50 mL IVPB  Status:  Discontinued     1 g 100 mL/hr over 30 Minutes Intravenous Every 24 hours 09/11/16 1234 09/12/16 1127   09/10/16 2000  vancomycin (VANCOCIN) IVPB 1000 mg/200 mL premix  Status:  Discontinued     1,000 mg 200 mL/hr over 60 Minutes Intravenous Every 24 hours 09/29/2016 2130 09/10/16 1616   09/10/16 1800  vancomycin (VANCOCIN) IVPB 750 mg/150 ml premix  Status:  Discontinued     750 mg 150 mL/hr over 60 Minutes Intravenous Every 12 hours 09/10/16 1616 09/12/16 1145   09/10/16 1400  levofloxacin (LEVAQUIN) IVPB 500 mg  Status:  Discontinued     500 mg 100 mL/hr over 60 Minutes Intravenous Every 24 hours 09/29/2016 2112 09/11/16 1220   09/10/16 0115  vancomycin (VANCOCIN) IVPB 1000 mg/200 mL premix  Status:  Discontinued     1,000 mg 200 mL/hr over 60 Minutes Intravenous   Once 09/10/16 0110 09/10/16 0118   09/29/2016 2200  metroNIDAZOLE (FLAGYL) IVPB 500 mg  Status:  Discontinued     500 mg 100 mL/hr over 60 Minutes Intravenous Every 6 hours 09/26/2016 2110 09/11/16 0902   09/26/2016 2115  vancomycin (VANCOCIN) IVPB 1000 mg/200 mL premix     1,000 mg 200 mL/hr over 60 Minutes Intravenous  Once 10/02/2016 2108 09/06/2016 2323   09/15/2016 1430  levofloxacin (LEVAQUIN) IVPB 750 mg     750 mg 100 mL/hr over 90 Minutes Intravenous  Once 09/13/2016 1415 09/21/2016 2152   09/18/2016 1430  metroNIDAZOLE (FLAGYL) IVPB 500 mg     500 mg 100 mL/hr over 60 Minutes Intravenous  Once 09/12/2016 1415 09/25/2016 1850   09/29/2016 1430  levofloxacin (LEVAQUIN) IVPB 750 mg  Status:  Discontinued     750 mg 100 mL/hr over 90 Minutes Intravenous  Once 09/20/2016 1416 09/13/2016 1416     Subjective: Seen and examined and felt weak and nauseous. Did not want to eat. No CP or SOB. Had abdominal Pain.   Objective: Vitals:   09/15/16 1451 09/15/16 2032 09/16/16 0323 09/16/16 0646  BP:   (!) 143/78   Pulse: 90 86    Resp: '20 17 18   ' Temp: (!) 97.5 F (36.4 C) 97.9 F (36.6 C) 97.6 F (36.4 C)   TempSrc: Oral Oral Oral   SpO2: 100% 99%    Weight:    79.6 kg (175 lb 7.8 oz)  Height:        Intake/Output Summary (Last 24 hours) at  09/16/16 0745 Last data filed at 09/16/16 0646  Gross per 24 hour  Intake              610 ml  Output              500 ml  Net              110 ml   Filed Weights   09/13/16 0353 09/15/16 0534 09/16/16 0646  Weight: 79.4 kg (175 lb) 79.6 kg (175 lb 8 oz) 79.6 kg (175 lb 7.8 oz)    Examination: Physical Exam:  Constitutional: WN/WD obese Caucasian female in NAD and appears calm and comfortable Eyes: Lids and conjunctivae normal, sclerae anicteric  ENMT: External Ears, Nose appear normal. Grossly normal hearing. Mucous membranes are moist. Neck: Appears normal, supple, no cervical masses, normal ROM, no appreciable thyromegaly Respiratory: Diminished to  auscultation bilaterally, no wheezing, rales, rhonchi or crackles. Normal respiratory effort and patient is not tachypenic. No accessory muscle use.  Cardiovascular: RRR, no murmurs / rubs / gallops. S1 and S2 auscultated. 3+ LE edema.  Abdomen: Soft, non-tender, non-distended. No masses palpated. No appreciable hepatosplenomegaly. Bowel sounds positive.  GU: Deferred. Musculoskeletal: No clubbing / cyanosis of digits/nails. No joint deformity upper and lower extremities.  Skin: Diffuse ecchymosis. No induration; Warm and dry.  Neurologic: CN 2-12 grossly intact with no focal deficits. Sensation intact in all 4 Extremities. Romberg sign cerebellar reflexes not assessed.  Psychiatric: Normal judgment and insight. Alert and oriented x 3. Depressed mood and flat affect.   Data Reviewed: I have personally reviewed following labs and imaging studies  CBC:  Recent Labs Lab 09/17/2016 1139 09/10/16 0949  09/12/16 0707 09/13/16 1755 09/14/16 0728 09/15/16 0210 09/16/16 0331  WBC 21.0* 13.8*  < > 14.2* 16.9* 21.6* 16.7* 26.9*  NEUTROABS 18.2* 11.4*  --   --   --   --   --   --   HGB 10.1* 9.9*  < > 8.8* 9.7* 11.3* 8.6* 9.1*  HCT 30.3* 29.8*  < > 26.1* 29.9* 35.2* 26.2* 26.0*  MCV 94.4 92.5  < > 97.8 89.8 93.6 95.6 95.9  PLT 84* 76*  < > 62* 89* 81* 66* 115*  < > = values in this interval not displayed. Basic Metabolic Panel:  Recent Labs Lab 09/10/16 0949  09/12/16 0707 09/13/16 1755 09/14/16 0728 09/15/16 0210 09/16/16 0331  NA 135  < > 133* 133* 132* 132* 130*  K 4.4  < > 3.8 3.9 4.0 3.2* 4.4  CL 107  < > 108 108 107 109 105  CO2 22  < > 20* 19* 17* 18* 16*  GLUCOSE 67  < > 104* 126* 110* 98 107*  BUN 23*  < > '17 17 16 16 18  ' CREATININE 1.13*  < > 1.13* 1.27* 1.21* 1.12* 1.33*  CALCIUM 6.6*  < > 7.0* 7.0* 7.1* 6.7* 6.9*  MG 1.5*  --   --   --   --   --   --   < > = values in this interval not displayed. GFR: Estimated Creatinine Clearance: 44.9 mL/min (A) (by C-G formula  based on SCr of 1.33 mg/dL (H)). Liver Function Tests:  Recent Labs Lab 09/29/2016 1139 09/10/16 0949 09/12/16 1059 09/13/16 1755 09/15/16 0834  AST 21 19  --  25 18  ALT 15 15  --  13* 11*  ALKPHOS 184* 191*  --  270* 202*  BILITOT 0.6 0.8 0.8 0.9 0.8  PROT  4.7* 4.7*  --  4.9* 4.1*  ALBUMIN 1.0* <1.0*  --  1.3* 1.1*    Recent Labs Lab 09/28/2016 1139  LIPASE 14   No results for input(s): AMMONIA in the last 168 hours. Coagulation Profile:  Recent Labs Lab 09/10/16 0949  INR 1.29   Cardiac Enzymes:  Recent Labs Lab 09/04/2016 1139 09/10/16 0949 09/13/16 1755  CKTOTAL  --   --  13*  TROPONINI <0.03 <0.03  --    BNP (last 3 results) No results for input(s): PROBNP in the last 8760 hours. HbA1C: No results for input(s): HGBA1C in the last 72 hours. CBG:  Recent Labs Lab 09/15/16 1158 09/15/16 1449 09/15/16 1643 09/15/16 2045 09/16/16 0626  GLUCAP 141* 134* 126* 144* 108*   Lipid Profile: No results for input(s): CHOL, HDL, LDLCALC, TRIG, CHOLHDL, LDLDIRECT in the last 72 hours. Thyroid Function Tests: No results for input(s): TSH, T4TOTAL, FREET4, T3FREE, THYROIDAB in the last 72 hours. Anemia Panel: No results for input(s): VITAMINB12, FOLATE, FERRITIN, TIBC, IRON, RETICCTPCT in the last 72 hours. Sepsis Labs:  Recent Labs Lab 09/18/2016 1449 09/27/2016 2034 09/10/16 0558 09/10/16 0949  PROCALCITON  --   --   --  0.24  LATICACIDVEN 3.24* 2.65* 1.3 1.3    Recent Results (from the past 240 hour(s))  Gastrointestinal Panel by PCR , Stool     Status: None   Collection Time: 09/21/2016  1:30 AM  Result Value Ref Range Status   Campylobacter species NOT DETECTED NOT DETECTED Final   Plesimonas shigelloides NOT DETECTED NOT DETECTED Final   Salmonella species NOT DETECTED NOT DETECTED Final   Yersinia enterocolitica NOT DETECTED NOT DETECTED Final   Vibrio species NOT DETECTED NOT DETECTED Final   Vibrio cholerae NOT DETECTED NOT DETECTED Final    Enteroaggregative E coli (EAEC) NOT DETECTED NOT DETECTED Final   Enteropathogenic E coli (EPEC) NOT DETECTED NOT DETECTED Final   Enterotoxigenic E coli (ETEC) NOT DETECTED NOT DETECTED Final   Shiga like toxin producing E coli (STEC) NOT DETECTED NOT DETECTED Final   Shigella/Enteroinvasive E coli (EIEC) NOT DETECTED NOT DETECTED Final   Cryptosporidium NOT DETECTED NOT DETECTED Final   Cyclospora cayetanensis NOT DETECTED NOT DETECTED Final   Entamoeba histolytica NOT DETECTED NOT DETECTED Final   Giardia lamblia NOT DETECTED NOT DETECTED Final   Adenovirus F40/41 NOT DETECTED NOT DETECTED Final   Astrovirus NOT DETECTED NOT DETECTED Final   Norovirus GI/GII NOT DETECTED NOT DETECTED Final   Rotavirus A NOT DETECTED NOT DETECTED Final   Sapovirus (I, II, IV, and V) NOT DETECTED NOT DETECTED Final  C difficile quick scan w PCR reflex     Status: None   Collection Time: 09/14/2016  1:30 AM  Result Value Ref Range Status   C Diff antigen NEGATIVE NEGATIVE Final   C Diff toxin NEGATIVE NEGATIVE Final   C Diff interpretation No C. difficile detected.  Final  Blood Culture (routine x 2)     Status: None   Collection Time: 09/16/2016  2:09 PM  Result Value Ref Range Status   Specimen Description BLOOD RIGHT ANTECUBITAL  Final   Special Requests   Final    BOTTLES DRAWN AEROBIC AND ANAEROBIC Blood Culture adequate volume   Culture   Final    NO GROWTH 5 DAYS Performed at [facility], [address], [place], [place] [address]    Report Status 09/14/2016 FINAL  Final  Culture, Urine     Status: None  Collection Time: 09/23/2016  9:40 PM  Result Value Ref Range Status   Specimen Description URINE, CLEAN CATCH  Final   Special Requests NONE  Final   Culture   Final    NO GROWTH Performed at [facility], [place] [address], [place], [place] [address]    Report Status 09/11/2016 FINAL  Final    Radiology Studies: US Abdomen Limited Ruq  Result Date: 09/14/2016 CLINICAL DATA:   Elevated liver enzymes EXAM: ULTRASOUND ABDOMEN LIMITED RIGHT UPPER QUADRANT COMPARISON:  None. FINDINGS: Gallbladder: Surgically absent. Common bile duct: Diameter: 4 mm. No intrahepatic or extrahepatic biliary duct dilatation. Liver: No focal lesion identified. Liver echogenicity is increased diffusely. A slight amount of ascites is noted. There is a right pleural effusion. IMPRESSION: 1.  Gallbladder absent. 2. Diffuse increase in liver echogenicity, a finding most likely indicative of hepatic steatosis. While no focal liver lesions are evident on this study, it must be cautioned that the sensitivity of ultrasound for detection of focal liver lesions is diminished significantly in this circumstance. 3.  Trace ascites. 4.  Right pleural effusion. Electronically Signed   By: Lowella Grip III M.D.   On: 09/14/2016 10:07   Scheduled Meds: . aspirin EC  81 mg Oral Daily  . citalopram  40 mg Oral QHS  . feeding supplement (ENSURE ENLIVE)  237 mL Oral TID BM  . furosemide  20 mg Oral Daily  . insulin aspart  0-5 Units Subcutaneous QHS  . insulin aspart  0-9 Units Subcutaneous TID WC  . metoprolol tartrate  12.5 mg Oral BID  . pantoprazole (PROTONIX) IV  40 mg Intravenous Q24H  . sodium chloride flush  3 mL Intravenous Q12H  . tiotropium  18 mcg Inhalation Daily   Continuous Infusions: . ceFEPime (MAXIPIME) IV 1 g (09/16/16 0627)  . metronidazole Stopped (09/16/16 0118)     LOS: 7 days   Kerney Elbe, DO [facility] Pager [phone]  If 7PM-7AM, please contact night-coverage [email or web address] Password [facility] 09/16/2016, 7:45 AM

## 2016-09-16 NOTE — Progress Notes (Signed)
Nutrition Consult/Follow Up  DOCUMENTATION CODES:   Severe malnutrition in context of chronic illness  INTERVENTION:    Continue Ensure Enlive po TID, each supplement provides 350 kcal and 20 grams of protein   Add Prostat liquid protein po 30 ml BID with meals, each supplement provides 100 kcal, 15 grams protein  NUTRITION DIAGNOSIS:   Malnutrition (severe) related to chronic illness (failure to thrive post repair aortic dissection) as evidenced by energy intake < or equal to 50% for > or equal to 1 month, percent weight loss (8% x 3 months), ongoing  GOAL:   Patient will meet greater than or equal to 90% of their needs, unmet  MONITOR:   PO intake, Supplement acceptance, Labs, Weight trends, Skin, I & O's  ASSESSMENT:    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 She presented with weakness and met sepsis criteria with concern for enteritis. Antibiotics started. Cultures pending.  Pt advanced to a Soft diet 8/13. Ensure Enlive added. PO intake remains poor. PO intake 25% per flowsheet records. Sometimes she is refusing her trays stating "I'm not hungry". Labs and medications reviewed. Na 130 (L). CBG's [id number].  Diet Order:  DIET SOFT Room service appropriate? Yes; Fluid consistency: Thin  Skin:   Reviewed, no issues  Last BM:  8/15  Height:   Ht Readings from Last 1 Encounters:  09/10/16 '5\' 3"'  (1.6 m)   Weight:   Wt Readings from Last 1 Encounters:  09/16/16 175 lb 7.8 oz (79.6 kg)   Wt Readings from Last 10 Encounters:  09/16/16 175 lb 7.8 oz (79.6 kg)  09/11/2016 159 lb (72.1 kg)  08/21/16 159 lb 9.8 oz (72.4 kg)  06/10/16 181 lb (82.1 kg)   05/27/16 186 lb 6.4 oz (84.6 kg)  03/28/14 173 lb 12.8 oz (78.8 kg)  02/14/14 170 lb (77.1 kg)  12/19/11 150 lb (68 kg)  11/19/11 161 lb 8 oz (73.3 kg)  11/12/11 164 lb (74.4 kg)   Ideal Body Weight:  52.2 kg  BMI:  Body mass index is 31.09 kg/m.  Estimated Nutritional Needs:   Kcal:  2000-2200  Protein:  110-120 gm  Fluid:  2.0-2.2 L  EDUCATION NEEDS:   No education needs identified at this time  Arthur Holms, RD, LDN Pager #: [phone] After-Hours Pager #: [phone]

## 2016-09-17 ENCOUNTER — Inpatient Hospital Stay (HOSPITAL_COMMUNITY): Payer: BLUE CROSS/BLUE SHIELD

## 2016-09-17 DIAGNOSIS — R319 Hematuria, unspecified: Secondary | ICD-10-CM

## 2016-09-17 LAB — COMPREHENSIVE METABOLIC PANEL
ALK PHOS: 253 U/L — AB (ref 38–126)
ALT: 13 U/L — ABNORMAL LOW (ref 14–54)
ANION GAP: 6 (ref 5–15)
AST: 18 U/L (ref 15–41)
Albumin: 1.1 g/dL — ABNORMAL LOW (ref 3.5–5.0)
BUN: 20 mg/dL (ref 6–20)
CALCIUM: 7 mg/dL — AB (ref 8.9–10.3)
CO2: 18 mmol/L — AB (ref 22–32)
Chloride: 107 mmol/L (ref 101–111)
Creatinine, Ser: 1.38 mg/dL — ABNORMAL HIGH (ref 0.44–1.00)
GFR calc non Af Amer: 41 mL/min — ABNORMAL LOW (ref >60)
GFR, EST AFRICAN AMERICAN: 47 mL/min — AB (ref >60)
Glucose, Bld: 102 mg/dL — ABNORMAL HIGH (ref 65–99)
POTASSIUM: 3.7 mmol/L (ref 3.5–5.1)
SODIUM: 131 mmol/L — AB (ref 135–145)
TOTAL PROTEIN: 4.5 g/dL — AB (ref 6.5–8.1)
Total Bilirubin: 0.8 mg/dL (ref 0.3–1.2)

## 2016-09-17 LAB — CBC WITH DIFFERENTIAL/PLATELET
Basophils Absolute: 0 10*3/uL (ref 0.0–0.1)
Basophils Relative: 0 %
EOS ABS: 0 10*3/uL (ref 0.0–0.7)
EOS PCT: 0 %
HCT: 26.6 % — ABNORMAL LOW (ref 36.0–46.0)
HEMOGLOBIN: 8.9 g/dL — AB (ref 12.0–15.0)
LYMPHS ABS: 2.2 10*3/uL (ref 0.7–4.0)
Lymphocytes Relative: 11 %
MCH: 32 pg (ref 26.0–34.0)
MCHC: 33.5 g/dL (ref 30.0–36.0)
MCV: 95.7 fL (ref 78.0–100.0)
MONO ABS: 0.7 10*3/uL (ref 0.1–1.0)
MONOS PCT: 3 %
Neutro Abs: 17 10*3/uL — ABNORMAL HIGH (ref 1.7–7.7)
Neutrophils Relative %: 85 %
Platelets: 103 10*3/uL — ABNORMAL LOW (ref 150–400)
RBC: 2.78 MIL/uL — ABNORMAL LOW (ref 3.87–5.11)
RDW: 19.7 % — AB (ref 11.5–15.5)
WBC: 19.9 10*3/uL — ABNORMAL HIGH (ref 4.0–10.5)

## 2016-09-17 LAB — PATHOLOGIST SMEAR REVIEW

## 2016-09-17 LAB — GLUCOSE, CAPILLARY
GLUCOSE-CAPILLARY: 114 mg/dL — AB (ref 65–99)
Glucose-Capillary: 126 mg/dL — ABNORMAL HIGH (ref 65–99)
Glucose-Capillary: 128 mg/dL — ABNORMAL HIGH (ref 65–99)
Glucose-Capillary: 101 mg/dL — ABNORMAL HIGH (ref 65–99)

## 2016-09-17 LAB — URINE CULTURE

## 2016-09-17 LAB — MAGNESIUM: Magnesium: 1.5 mg/dL — ABNORMAL LOW (ref 1.7–2.4)

## 2016-09-17 LAB — PHOSPHORUS: PHOSPHORUS: 3.8 mg/dL (ref 2.5–4.6)

## 2016-09-17 MED ORDER — FLUCONAZOLE IN SODIUM CHLORIDE 200-0.9 MG/100ML-% IV SOLN
200.0000 mg | INTRAVENOUS | Status: DC
  Administered 2016-09-18: 200 mg via INTRAVENOUS
  Filled 2016-09-17 (×3): qty 100

## 2016-09-17 MED ORDER — DRONABINOL 2.5 MG PO CAPS
2.5000 mg | ORAL_CAPSULE | Freq: Two times a day (BID) | ORAL | Status: DC
  Administered 2016-09-18 – 2016-09-20 (×6): 2.5 mg via ORAL
  Filled 2016-09-17 (×6): qty 1

## 2016-09-17 MED ORDER — MORPHINE SULFATE (PF) 2 MG/ML IV SOLN
2.0000 mg | Freq: Four times a day (QID) | INTRAVENOUS | Status: DC | PRN
  Administered 2016-09-20 – 2016-10-01 (×11): 2 mg via INTRAVENOUS
  Filled 2016-09-17 (×14): qty 1

## 2016-09-17 MED ORDER — DEXTROSE 5 % IV SOLN
2.0000 g | INTRAVENOUS | Status: DC
  Administered 2016-09-17 – 2016-09-19 (×3): 2 g via INTRAVENOUS
  Filled 2016-09-17 (×5): qty 2

## 2016-09-17 MED ORDER — MAGNESIUM SULFATE 2 GM/50ML IV SOLN
2.0000 g | Freq: Once | INTRAVENOUS | Status: AC
  Administered 2016-09-17: 2 g via INTRAVENOUS
  Filled 2016-09-17: qty 50

## 2016-09-17 NOTE — Progress Notes (Signed)
Clinical Social Worker still following patient for discharge needs.  Rhea Pink, MSW,  [place]

## 2016-09-17 NOTE — Progress Notes (Signed)
Pt refused 1400 nutrition supplement and also declined to eat any lunch. Pt accepts water to drink. Will educate and continue to monitor

## 2016-09-17 NOTE — Progress Notes (Signed)
Physical Therapy Treatment Patient Details Name: Haley Graham MRN: [id number] DOB: November 10, 1955 Today's Date: 09/17/2016    History of Present Illness This 61 y.o. female admitted with sepsis, hypotension, dehydration, amemia, COPD, AKI due to poor oral intake and hypotension/dehydration.  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 Pt currently with functional limitations due to balance and endurance deficits. Pt was able to ambulate with RW with min to max assist as pt anxiety limiting pt such that pt will sit abruptly into the recliner without warning.  Pt very self limiting and needs max motivation to do very little.  Pt refused adamantly to stay up in chair even though PT, tech and nurse gave reasons to stay up.  Ended up getting pt back in bed as ultimately she was demanding and crying to get back inbed.   Pt will benefit from skilled PT to increase their independence and safety with mobility to allow discharge to the venue listed below.     Follow Up Recommendations  SNF     Equipment Recommendations  Other (comment) (TBA)    Recommendations for Other Services       Precautions / Restrictions Precautions Precautions: Fall Restrictions Weight Bearing Restrictions: No    Mobility  Bed Mobility Overal bed mobility: Needs Assistance Bed Mobility: Supine to Sit;Sit to Supine Rolling: Supervision Sidelying to sit: Min assist Supine to sit: Min assist Sit to supine: Mod assist   General bed mobility comments: assist needed to elevate trunk into sitting and to bring bilat LE into bed; cues for sequencing and technique; use of bed rail.  needed assist to bring LEs back into bed.   Transfers Overall transfer level: Needs assistance Equipment used: Rolling walker (2 wheeled) Transfers: Sit to/from Stand Sit  to Stand: Min assist;+2 safety/equipment;Mod assist         General transfer comment: assist to steady and +2 for safety due to behavioral issues  Ambulation/Gait Ambulation/Gait assistance: Min assist;Max assist;+2 safety/equipment;+2 physical assistance Ambulation Distance (Feet): 28 Feet (10 feet, 8 feet, 10 feet) Assistive device: Rolling walker (2 wheeled) Gait Pattern/deviations: Step-through pattern;Decreased stride length;Leaning posteriorly;Staggering left;Staggering right;Shuffle Gait velocity: decr Gait velocity interpretation: Below normal speed for age/gender General Gait Details: min A to max assist depending on pt's effort.  Pt starts out walking well and after about 5 feet she will appear to get anxious and LEs "wobbly" and then she abruptly sits without warning (PT had tech have chair close behind pt to be prepared).  +2 for safety required due to pt's behavior   Stairs            Wheelchair Mobility    Modified Rankin (Stroke Patients Only)       Balance Overall balance assessment: Needs assistance Sitting-balance support: Feet supported;No upper extremity supported Sitting balance-Leahy Scale: Fair Sitting balance - Comments: sat without UE support    Standing balance support: Bilateral upper extremity supported;During functional activity Standing balance-Leahy Scale: Poor Standing balance comment: requires UE support                             Cognition Arousal/Alertness: Awake/alert Behavior During Therapy: Flat affect Overall Cognitive Status: Impaired/Different from baseline Area of Impairment: Attention;Following commands;Safety/judgement;Problem solving  Current Attention Level: Sustained Memory: Decreased short-term memory Following Commands: Follows one step commands consistently;Follows multi-step commands inconsistently Safety/Judgement: Decreased awareness of safety Awareness: Intellectual Problem  Solving: Slow processing;Decreased initiation;Difficulty sequencing;Requires verbal cues General Comments: Pt does not make eye contact.  She will only answer questions when heavily prodded.  The only spontaneous statements she said where "I'm not getting up", and "put me back to bed!"  Most other responses were in the forms of head nods or yes/no responses       Exercises Total Joint Exercises Long Arc Quad: AROM;Both;Seated;10 reps    General Comments        Pertinent Vitals/Pain Pain Assessment: Faces Faces Pain Scale: Hurts little more Pain Location: abdomen Pain Descriptors / Indicators: Grimacing;Guarding Pain Intervention(s): Limited activity within patient's tolerance;Monitored during session;Premedicated before session;Repositioned    Home Living                      Prior Function            PT Goals (current goals can now be found in the care plan section) Progress towards PT goals: Not progressing toward goals - comment (self limiting)    Frequency    Min 3X/week      PT Plan Current plan remains appropriate    Co-evaluation              AM-PAC PT "6 Clicks" Daily Activity  Outcome Measure  Difficulty turning over in bed (including adjusting bedclothes, sheets and blankets)?: Total Difficulty moving from lying on back to sitting on the side of the bed? : Total Difficulty sitting down on and standing up from a chair with arms (e.g., wheelchair, bedside commode, etc,.)?: Total Help needed moving to and from a bed to chair (including a wheelchair)?: A Lot Help needed walking in hospital room?: A Lot Help needed climbing 3-5 steps with a railing? : A Lot 6 Click Score: 9    End of Session Equipment Utilized During Treatment: Gait belt Activity Tolerance: Patient limited by fatigue Patient left: in bed;with call bell/phone within reach;with bed alarm set Nurse Communication: Mobility status PT Visit Diagnosis: Muscle weakness (generalized)  (M62.81);Other abnormalities of gait and mobility (R26.89);Unsteadiness on feet (R26.81);Adult, failure to thrive (R62.7)     Time: 8115-7262 PT Time Calculation (min) (ACUTE ONLY): 31 min  Charges:  $Gait Training: 23-37 mins                    G Codes:       Angell Honse,PT Acute Rehabilitation [phone] (pager)    [name] 09/17/2016, 1:58 PM

## 2016-09-17 NOTE — Progress Notes (Signed)
PROGRESS NOTE    Haley CEDOTAL  Graham:[id number] DOB: 11-Aug-1955 DOA: 09/10/2016 PCP: Ignatius Specking, MD   Brief Narrative: Haley Graham is a 61 y.o. 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 She presented with weakness and met sepsis criteria with concern for enteritis. Antibiotics started. Patient has worsening Cr and Leukocytosis yesterday. Complaining of Nausea and Abdominal Pain so will re-culture and repeat Imaging. Appears depressed and does not want to eat and just wants to lay in bed so will get Psych evaluation and place patient on Marinol. Discussed with Haley Graham about Palliative Care Consult and he declined at this time.   Assessment & Plan:   Principal Problem:   Sepsis (HCC) Active Problems:   GERD (gastroesophageal reflux disease)   Dissection of thoracoabdominal aorta (HCC)   AAA (abdominal aortic aneurysm) (HCC)   Benign essential HTN   Diabetes mellitus type 2 in obese (HCC)   Anxiety state   Hyponatremia   Leukocytosis   Hypotension   AKI (acute kidney injury) (HCC)   Dehydration   Weakness   Anemia   Thrombocytopenia (HCC)   Abnormal computed tomography angiography (CTA) of abdomen and pelvis   Colitis: Probable   Protein-calorie malnutrition, severe  Sepsis -Suspected Secondary to colitis/enteritis.  -WBC worsening and went from 16.7 -> 26.9 -> 19.9 -Physiology improved with IV fluids and antibiotics. Has some tachycardia.  -GI pathogen panel and C. Diff negative.  -Initial Urine/blood culture negative. Repeat U/A shows evidence of UTI with Many bacteria, Large Hb, Trace Leukocytes, Positive Nitrites, TNTC RBC, TNTC WBC -continue  Flagyl and Cefepime for now and may need to escalate -Repeat Blood Cx -Repeat CT Chest/Abd/Pelvis w/o Contrast pending -Continue to Monitor  -Yeast grew un UTI so will treat with Fluconazole  -May consider Palliative Consult but patient's Haley Graham does not want to pursue that avenue yet  Hypotension -Improved with IV fluids which has made her anasarca worse. Troponin negative. Albumin extremely low. -monitor BP  Dehydration/Poor oral intake/FTT -encourage oral intake -dietitian consulted: protein supplementation as patient has Significant Hypoalbuminemia and LE swelling  -Will add Marinol to help appetite Stimulant   Normocytic Anemia No evidence of bleeding. Hb/Hct went from 8.6/26.2 -> 9.1/26.0 -> 8.9/26.6 -repeat CBC in AM   COPD Stable. -continue nebulizers  Acute Kidney Injury -Suspected Secondary to hypotension. -Increased Diurectics -BUN/Cr went from 16/1.12 -> 18/1.33 -> 20/1.38 -Continue to Monitor and Avoid Nephrotoxics -Repeat CMP in AM  Anasarca -Secondary to IV fluids. Recent echo significant for grade 1 diastolic dysfunction. Repeat echocardiogram significant for an EF of 65-70% with grade 1 diastolic dysfunction and trace mitral regurg and tricuspid regurg -Lasix increased   Oliguria -Improvement with diuresis -strict in/out -Patient +2.0 Liters since Admit  Yeast UTI ->100,000 CFU's -Treat With IV Fluconazole  Hematuria -Tea colored urine. Decreased urine output initially which is now improved. Liver unremarkable for acute process. Vascular surgery without recommendations for thrombosis on consultation. -Monitor and repeated U/A today  -May need to discuss with Urology  Hyponatremia -Mild at 131. Likely secondary to third spacing. -Continue to Monitor   Elevated alkaline phosphatase S/p cholecystecomy. GGT elevated. Alkaline phosphatase improving. RUQ unremarkable except for fatty liver  Abnormal CT angiogram of  chest/abdomen/pelvis -Thrombus of left renal artery. Vascular surgery consulted with  no recommendations for acute management.  Thrombocytopenia -Presumed secondary to acute illness. Has remained low and stable fluctuating between 60,000 and 80,000 but now improved to 115,000 -> 103,00. Discussed with hematologist, Dr. Julien Nordmann who recommends continued surveillance.  -Can follow-up as an outpatient if not improved by discharge. -Pathology smear review pending  GERD Stable. -continue PPI  Depression/Anxiety -C/w Xanax -discontinue Klonipin -Will Consult Psychiatry in AM for formal evaluation as patient appears severely depressed   [place] type B aortic dissection -Patient is s/p stent graft placement on previous admission. -continue metoprolol at reduce dose  Tachycardia -Rebound secondary to abrupt cessation of beta-blocker. -continue metoprolol at reduced dose of 12.31m BID  Leukocytosis -Patient without new source of infection but will reculture. Afebrile.  -Inital Blood cultures negative and Repeat Pending but NGTD <12 Hours. Pro calcitonin 0.24 on admission. -Repeat Imaging Pending   Hypokalemia -K+ Improved and now 3.7   DVT prophylaxis: SCDs Code Status: FULL CODE Family Communication: Discussed with Haley Graham over the Phone Disposition Plan: Remain Inpatient .   Consultants:   Vascular Surgery  Case was discussed with Dr. MJulien Nordmann   Procedures:  ABI   Antimicrobials:  Anti-infectives    Start     Dose/Rate Route Frequency Ordered Stop   09/17/16 1800  ceFEPIme (MAXIPIME) 2 g in dextrose 5 % 50 mL IVPB     2 g 100 mL/hr over 30 Minutes Intravenous Every 24 hours 09/17/16 1243     09/17/16 1800  fluconazole (DIFLUCAN) IVPB 200 mg     200 mg 100 mL/hr over 60 Minutes Intravenous Every 24 hours 09/17/16 1651     09/12/16 1400  ceFEPIme (MAXIPIME) 1 g in dextrose 5 % 50 mL IVPB  Status:  Discontinued     1 g 100 mL/hr over 30 Minutes Intravenous Every  8 hours 09/12/16 1127 09/17/16 1243   09/11/16 1600  metroNIDAZOLE (FLAGYL) IVPB 500 mg     500 mg 100 mL/hr over 60 Minutes Intravenous Every 8 hours 09/11/16 0902     09/11/16 1400  ceFEPIme (MAXIPIME) 1 g in dextrose 5 % 50 mL IVPB  Status:  Discontinued     1 g 100 mL/hr over 30 Minutes Intravenous Every 24 hours 09/11/16 1234 09/12/16 1127   09/10/16 2000  vancomycin (VANCOCIN) IVPB 1000 mg/200 mL premix  Status:  Discontinued     1,000 mg 200 mL/hr over 60 Minutes Intravenous Every 24 hours 09/29/2016 2130 09/10/16 1616   09/10/16 1800  vancomycin (VANCOCIN) IVPB 750 mg/150 ml premix  Status:  Discontinued     750 mg 150 mL/hr over 60 Minutes Intravenous Every 12 hours 09/10/16 1616 09/12/16 1145   09/10/16 1400  levofloxacin (LEVAQUIN) IVPB 500 mg  Status:  Discontinued     500 mg 100 mL/hr over 60 Minutes Intravenous Every 24 hours 09/10/2016 2112 09/11/16 1220   09/10/16 0115  vancomycin (VANCOCIN) IVPB 1000 mg/200 mL premix  Status:  Discontinued     1,000 mg 200 mL/hr over 60 Minutes Intravenous  Once 09/10/16 0110 09/10/16 0118   09/18/2016 2200  metroNIDAZOLE (FLAGYL) IVPB 500 mg  Status:  Discontinued     500 mg 100 mL/hr over 60 Minutes Intravenous Every 6 hours 09/28/2016 2110 09/11/16 0902   09/04/2016 2115  vancomycin (VANCOCIN) IVPB 1000 mg/200 mL premix     1,000 mg 200 mL/hr over 60 Minutes Intravenous  Once 10/01/2016 2108 09/07/2016 2323   09/19/2016 1430  levofloxacin (LEVAQUIN) IVPB 750 mg  750 mg 100 mL/hr over 90 Minutes Intravenous  Once 09/08/2016 1415 09/08/2016 2152   09/06/2016 1430  metroNIDAZOLE (FLAGYL) IVPB 500 mg     500 mg 100 mL/hr over 60 Minutes Intravenous  Once 09/14/2016 1415 10/01/2016 1850   09/13/2016 1430  levofloxacin (LEVAQUIN) IVPB 750 mg  Status:  Discontinued     750 mg 100 mL/hr over 90 Minutes Intravenous  Once 09/26/2016 1416 09/28/2016 1416     Subjective: Seen and examined and felt weak and did not feel good. Still felt nauseous and was eating very  little. Refused her protein supplements. Wanted bed pan.   Objective: Vitals:   09/16/16 2100 09/17/16 0548 09/17/16 0930 09/17/16 1600  BP: 136/71 126/66 (!) 106/56 122/65  Pulse: 92 80 80 86  Resp: _0 Temp: 97.6 F (36.4 C) 97.6 F (36.4 C) 98.3 F (36.8 C) 98.5 F (36.9 C)  TempSrc: Oral Oral Oral Oral  SpO2: 99% 96% 98% 98%  Weight:  79.7 kg (175 lb 12.8 oz)    Height:        Intake/Output Summary (Last 24 hours) at 09/17/16 1937 Last data filed at 09/17/16 1500  Gross per 24 hour  Intake              480 ml  Output              801 ml  Net             -321 ml   Filed Weights   09/15/16 0534 09/16/16 0646 09/17/16 0548  Weight: 79.6 kg (175 lb 8 oz) 79.6 kg (175 lb 7.8 oz) 79.7 kg (175 lb 12.8 oz)   Examination: Physical Exam:  Constitutional: WN/WD obese Caucasian female in NAD but appears depressed and not wanting to eat  Eyes: Sclerae anicteric; Conjunctivae non-injected ENMT: Grossly normal hearing. External ears and nose appear normal Neck: Supple with no JVD Respiratory: Diminished with no wheezing/rales/rhonchi. Patient not tachypenic or using any accessory muscles to breathe Cardiovascular: RRR. 3+ LE Edema Abdomen:  Soft, Mildly tender, Distended due to body habitus. Bowel sounds present GU: Foley Catheter in place draining Tea Colored Urine Musculoskeletal: No contractures; No cyanosis Skin: Warm and Dry. Has ecchymosis in LE Neurologic: CN 2-12 grossly intact. No focal deficits Psychiatric: Depressed mood and flat affect. Awake and alert.  Data Reviewed: I have personally reviewed following labs and imaging studies  CBC:  Recent Labs Lab 09/13/16 1755 09/14/16 0728 09/15/16 0210 09/16/16 0331 09/17/16 0337  WBC 16.9* 21.6* 16.7* 26.9* 19.9*  NEUTROABS  --   --   --   --  17.0*  HGB 9.7* 11.3* 8.6* 9.1* 8.9*  HCT 29.9* 35.2* 26.2* 26.0* 26.6*  MCV 89.8 93.6 95.6 95.9 95.7  PLT 89* 81* 66* 115* 828*   Basic Metabolic  Panel:  Recent Labs Lab 09/13/16 1755 09/14/16 0728 09/15/16 0210 09/16/16 0331 09/17/16 0337  NA 133* 132* 132* 130* 131*  K 3.9 4.0 3.2* 4.4 3.7  CL 108 107 109 105 107  CO2 19* 17* 18* 16* 18*  GLUCOSE 126* 110* 98 107* 102*  BUN _1 CREATININE 1.27* 1.21* 1.12* 1.33* 1.38*  CALCIUM 7.0* 7.1* 6.7* 6.9* 7.0*  MG  --   --   --   --  1.5*  PHOS  --   --   --   --  3.8   GFR: Estimated Creatinine Clearance: 43.3 mL/min (A) (by C-G formula  based on SCr of 1.38 mg/dL (H)). Liver Function Tests:  Recent Labs Lab 09/12/16 1059 09/13/16 1755 09/15/16 0834 09/17/16 0337  AST  --  _0 ALT  --  13* 11* 13*  ALKPHOS  --  270* 202* 253*  BILITOT 0.8 0.9 0.8 0.8  PROT  --  4.9* 4.1* 4.5*  ALBUMIN  --  1.3* 1.1* 1.1*   No results for input(s): LIPASE, AMYLASE in the last 168 hours. No results for input(s): AMMONIA in the last 168 hours. Coagulation Profile: No results for input(s): INR, PROTIME in the last 168 hours. Cardiac Enzymes:  Recent Labs Lab 09/13/16 1755  CKTOTAL 13*   BNP (last 3 results) No results for input(s): PROBNP in the last 8760 hours. HbA1C: No results for input(s): HGBA1C in the last 72 hours. CBG:  Recent Labs Lab 09/16/16 1614 09/16/16 2139 09/17/16 0614 09/17/16 1111 09/17/16 1632  GLUCAP 106* 126* 114* 126* 128*   Lipid Profile: No results for input(s): CHOL, HDL, LDLCALC, TRIG, CHOLHDL, LDLDIRECT in the last 72 hours. Thyroid Function Tests: No results for input(s): TSH, T4TOTAL, FREET4, T3FREE, THYROIDAB in the last 72 hours. Anemia Panel: No results for input(s): VITAMINB12, FOLATE, FERRITIN, TIBC, IRON, RETICCTPCT in the last 72 hours. Sepsis Labs: No results for input(s): PROCALCITON, LATICACIDVEN in the last 168 hours.  Recent Results (from the past 240 hour(s))  Gastrointestinal Panel by PCR , Stool     Status: None   Collection Time: 09/29/2016  1:30 AM  Result Value Ref Range Status   Campylobacter  species NOT DETECTED NOT DETECTED Final   Plesimonas shigelloides NOT DETECTED NOT DETECTED Final   Salmonella species NOT DETECTED NOT DETECTED Final   Yersinia enterocolitica NOT DETECTED NOT DETECTED Final   Vibrio species NOT DETECTED NOT DETECTED Final   Vibrio cholerae NOT DETECTED NOT DETECTED Final   Enteroaggregative E coli (EAEC) NOT DETECTED NOT DETECTED Final   Enteropathogenic E coli (EPEC) NOT DETECTED NOT DETECTED Final   Enterotoxigenic E coli (ETEC) NOT DETECTED NOT DETECTED Final   Shiga like toxin producing E coli (STEC) NOT DETECTED NOT DETECTED Final   Shigella/Enteroinvasive E coli (EIEC) NOT DETECTED NOT DETECTED Final   Cryptosporidium NOT DETECTED NOT DETECTED Final   Cyclospora cayetanensis NOT DETECTED NOT DETECTED Final   Entamoeba histolytica NOT DETECTED NOT DETECTED Final   Giardia lamblia NOT DETECTED NOT DETECTED Final   Adenovirus F40/41 NOT DETECTED NOT DETECTED Final   Astrovirus NOT DETECTED NOT DETECTED Final   Norovirus GI/GII NOT DETECTED NOT DETECTED Final   Rotavirus A NOT DETECTED NOT DETECTED Final   Sapovirus (I, II, IV, and V) NOT DETECTED NOT DETECTED Final  C difficile quick scan w PCR reflex     Status: None   Collection Time: 09/15/2016  1:30 AM  Result Value Ref Range Status   C Diff antigen NEGATIVE NEGATIVE Final   C Diff toxin NEGATIVE NEGATIVE Final   C Diff interpretation No C. difficile detected.  Final  Blood Culture (routine x 2)     Status: None   Collection Time: 09/04/2016  2:09 PM  Result Value Ref Range Status   Specimen Description BLOOD RIGHT ANTECUBITAL  Final   Special Requests   Final    BOTTLES DRAWN AEROBIC AND ANAEROBIC Blood Culture adequate volume   Culture   Final    NO GROWTH 5 DAYS Performed at [facility], [address], [place], [place] [address]    Report  Status 09/14/2016 FINAL  Final  Culture, Urine     Status: None   Collection Time: 09/14/2016  9:40 PM  Result Value Ref Range Status    Specimen Description URINE, CLEAN CATCH  Final   Special Requests NONE  Final   Culture   Final    NO GROWTH Performed at [facility], [place] [address] [place], [place] [address]    Report Status 09/11/2016 FINAL  Final  Culture, Urine     Status: Abnormal   Collection Time: 09/16/16 10:49 AM  Result Value Ref Range Status   Specimen Description URINE, CLEAN CATCH  Final   Special Requests NONE  Final   Culture >=100,000 COLONIES/mL YEAST (A)  Final   Report Status 09/17/2016 FINAL  Final  Culture, blood (routine x 2)     Status: None (Preliminary result)   Collection Time: 09/16/16  9:34 PM  Result Value Ref Range Status   Specimen Description BLOOD RIGHT HAND  Final   Special Requests IN PEDIATRIC BOTTLE Blood Culture adequate volume  Final   Culture NO GROWTH < 12 HOURS  Final   Report Status PENDING  Incomplete  Culture, blood (routine x 2)     Status: None (Preliminary result)   Collection Time: 09/16/16  9:34 PM  Result Value Ref Range Status   Specimen Description BLOOD RIGHT HAND  Final   Special Requests IN PEDIATRIC BOTTLE Blood Culture adequate volume  Final   Culture NO GROWTH < 12 HOURS  Final   Report Status PENDING  Incomplete    Radiology Studies: No results found. Scheduled Meds: . aspirin EC  81 mg Oral Daily  . citalopram  40 mg Oral QHS  . feeding supplement (ENSURE ENLIVE)  237 mL Oral TID BM  . feeding supplement (PRO-STAT SUGAR FREE 64)  30 mL Oral BID  . furosemide  40 mg Oral Daily  . insulin aspart  0-5 Units Subcutaneous QHS  . insulin aspart  0-9 Units Subcutaneous TID WC  . metoprolol tartrate  12.5 mg Oral BID  . pantoprazole (PROTONIX) IV  40 mg Intravenous Q24H  . sodium chloride flush  3 mL Intravenous Q12H  . tiotropium  18 mcg Inhalation Daily   Continuous Infusions: . ceFEPime (MAXIPIME) IV 2 g (09/17/16 1800)  . fluconazole (DIFLUCAN) IV    . metronidazole Stopped (09/17/16 1649)     LOS: 8 days   Kerney Elbe,  DO [facility] Pager [phone]  If 7PM-7AM, please contact night-coverage [email or web address] Password [facility] 09/17/2016, 7:37 PM

## 2016-09-17 NOTE — Progress Notes (Signed)
Pharmacy Antibiotic Note  Haley Graham is a 61 y.o. female admitted on day # 9 antibiotics for sepsis due to colitis/enteritis.Marland Kitchen  Pharmacy has been consulted for Cefepime and Metronidazole dosing.  Day # 9 Flagyl, #6 Cefepime, after Vanc and Levaquin.   Afebrile, WBC up yesterday, trending down again. Creatinine has trended up to 1.38.    8/8 Blood and urine cultures and GI panel negative.  New blood cultures 8/15 no growth to date.  Yeast in urine culture 8/15.  Plan:  Change Cefepime to 2gm IV q24hrs.  Continue Metronidazole 500 mg IV q8hrs.  Follow renal function, clinical status.  Follow for length of therapy, possible de-escalation.  Need treatment for yeast in urine?  Height: 5\' 3"  (160 cm) Weight: 175 lb 12.8 oz (79.7 kg) IBW/kg (Calculated) : 52.4  Temp (24hrs), Avg:97.6 F (36.4 C), Min:97 F (36.1 C), Max:98.3 F (36.8 C)   Recent Labs Lab 09/13/16 1755 09/14/16 0728 09/15/16 0210 09/16/16 0331 09/17/16 0337  WBC 16.9* 21.6* 16.7* 26.9* 19.9*  CREATININE 1.27* 1.21* 1.12* 1.33* 1.38*    Estimated Creatinine Clearance: 43.3 mL/min (A) (by C-G formula based on SCr of 1.38 mg/dL (H)).    Allergies  Allergen Reactions  . Penicillins Rash and Other (See Comments)    PATIENT HAS HAD A PCN REACTION WITH IMMEDIATE RASH, FACIAL/TONGUE/THROAT SWELLING, SOB, OR LIGHTHEADEDNESS WITH HYPOTENSION:  #  #  #  YES  #  #  #   Has patient had a PCN reaction causing severe rash involving mucus membranes or skin necrosis: no Has patient had a PCN reaction that required hospitalization no Has patient had a PCN reaction occurring within the last 10 years:no If all of the above answers are "NO", then may proceed with Cephalosporin use.  . Ativan [Lorazepam] Other (See Comments)    Makes pt very confused and more agitated, irritable.  . Codeine Nausea And Vomiting  . Reglan [Metoclopramide] Other (See Comments)    TACHYCARDIA    Antimicrobials this admission:  Vancomycin 8/8  >>8/11  Levaquin 8/8 >> 8/10  Flagyl 8/8>>  Cefepime 8/10 >>  Dose adjustments this admission:  8/11:  Cefepime changed from 1 gm IV q24h to 1 gm IV q8hrs   8/16:  Cefepime adjusted to 2 gm IV q24hrs for increased serum creatinine  Microbiology results: 8/8 blood x 2: negative 8/8 urine: negative 8/8 GI panel: negative 8/8 Cdiff PCR: negative 8/15 urine - >100K/ml yeast 8/15 blood x 2 - ng < 12 hr so far  Thank you for allowing pharmacy to be a part of this patient's care.  Arty Baumgartner, [place] Pager: [phone] 09/17/2016 12:52 PM

## 2016-09-18 ENCOUNTER — Inpatient Hospital Stay (HOSPITAL_COMMUNITY): Payer: BLUE CROSS/BLUE SHIELD

## 2016-09-18 ENCOUNTER — Encounter (HOSPITAL_COMMUNITY): Payer: Self-pay | Admitting: General Surgery

## 2016-09-18 DIAGNOSIS — E43 Unspecified severe protein-calorie malnutrition: Secondary | ICD-10-CM

## 2016-09-18 DIAGNOSIS — F17210 Nicotine dependence, cigarettes, uncomplicated: Secondary | ICD-10-CM

## 2016-09-18 DIAGNOSIS — I959 Hypotension, unspecified: Secondary | ICD-10-CM

## 2016-09-18 DIAGNOSIS — J90 Pleural effusion, not elsewhere classified: Secondary | ICD-10-CM

## 2016-09-18 DIAGNOSIS — A419 Sepsis, unspecified organism: Secondary | ICD-10-CM

## 2016-09-18 DIAGNOSIS — D649 Anemia, unspecified: Secondary | ICD-10-CM

## 2016-09-18 DIAGNOSIS — N179 Acute kidney failure, unspecified: Secondary | ICD-10-CM

## 2016-09-18 DIAGNOSIS — K529 Noninfective gastroenteritis and colitis, unspecified: Secondary | ICD-10-CM

## 2016-09-18 DIAGNOSIS — E860 Dehydration: Secondary | ICD-10-CM

## 2016-09-18 DIAGNOSIS — F419 Anxiety disorder, unspecified: Secondary | ICD-10-CM

## 2016-09-18 DIAGNOSIS — R531 Weakness: Secondary | ICD-10-CM

## 2016-09-18 DIAGNOSIS — D696 Thrombocytopenia, unspecified: Secondary | ICD-10-CM

## 2016-09-18 DIAGNOSIS — F332 Major depressive disorder, recurrent severe without psychotic features: Secondary | ICD-10-CM

## 2016-09-18 DIAGNOSIS — R935 Abnormal findings on diagnostic imaging of other abdominal regions, including retroperitoneum: Secondary | ICD-10-CM

## 2016-09-18 DIAGNOSIS — Z9889 Other specified postprocedural states: Secondary | ICD-10-CM

## 2016-09-18 HISTORY — PX: IR THORACENTESIS ASP PLEURAL SPACE W/IMG GUIDE: IMG5380

## 2016-09-18 LAB — COMPREHENSIVE METABOLIC PANEL
ALK PHOS: 249 U/L — AB (ref 38–126)
ALT: 10 U/L — AB (ref 14–54)
AST: 19 U/L (ref 15–41)
Anion gap: 6 (ref 5–15)
BUN: 25 mg/dL — AB (ref 6–20)
CALCIUM: 6.9 mg/dL — AB (ref 8.9–10.3)
CO2: 17 mmol/L — AB (ref 22–32)
CREATININE: 1.5 mg/dL — AB (ref 0.44–1.00)
Chloride: 107 mmol/L (ref 101–111)
GFR calc Af Amer: 43 mL/min — ABNORMAL LOW (ref >60)
GFR calc non Af Amer: 37 mL/min — ABNORMAL LOW (ref >60)
GLUCOSE: 84 mg/dL (ref 65–99)
Potassium: 3.9 mmol/L (ref 3.5–5.1)
SODIUM: 130 mmol/L — AB (ref 135–145)
Total Bilirubin: 0.9 mg/dL (ref 0.3–1.2)
Total Protein: 3.7 g/dL — ABNORMAL LOW (ref 6.5–8.1)

## 2016-09-18 LAB — GLUCOSE, CAPILLARY
GLUCOSE-CAPILLARY: 97 mg/dL (ref 65–99)
GLUCOSE-CAPILLARY: 123 mg/dL — AB (ref 65–99)
Glucose-Capillary: 112 mg/dL — ABNORMAL HIGH (ref 65–99)

## 2016-09-18 LAB — CBC WITH DIFFERENTIAL/PLATELET
BASOS ABS: 0 10*3/uL (ref 0.0–0.1)
Basophils Relative: 0 %
EOS PCT: 0 %
Eosinophils Absolute: 0 10*3/uL (ref 0.0–0.7)
HCT: 20.6 % — ABNORMAL LOW (ref 36.0–46.0)
HEMOGLOBIN: 7.3 g/dL — AB (ref 12.0–15.0)
LYMPHS PCT: 15 %
Lymphs Abs: 2.1 10*3/uL (ref 0.7–4.0)
MCH: 34.8 pg — ABNORMAL HIGH (ref 26.0–34.0)
MCHC: 35.4 g/dL (ref 30.0–36.0)
MCV: 98.1 fL (ref 78.0–100.0)
MONOS PCT: 4 %
Monocytes Absolute: 0.6 10*3/uL (ref 0.1–1.0)
Neutro Abs: 11.6 10*3/uL — ABNORMAL HIGH (ref 1.7–7.7)
Neutrophils Relative %: 81 %
Platelets: 57 10*3/uL — ABNORMAL LOW (ref 150–400)
RBC: 2.1 MIL/uL — AB (ref 3.87–5.11)
RDW: 20.9 % — ABNORMAL HIGH (ref 11.5–15.5)
WBC: 14.3 10*3/uL — AB (ref 4.0–10.5)

## 2016-09-18 LAB — LACTATE DEHYDROGENASE, PLEURAL OR PERITONEAL FLUID: LD, Fluid: 51 U/L — ABNORMAL HIGH (ref 3–23)

## 2016-09-18 LAB — MAGNESIUM: Magnesium: 1.8 mg/dL (ref 1.7–2.4)

## 2016-09-18 LAB — BODY FLUID CELL COUNT WITH DIFFERENTIAL
Eos, Fluid: 0 %
Lymphs, Fluid: 7 %
MONOCYTE-MACROPHAGE-SEROUS FLUID: 36 % — AB (ref 50–90)
Neutrophil Count, Fluid: 57 % — ABNORMAL HIGH (ref 0–25)
Total Nucleated Cell Count, Fluid: 37 cu mm (ref 0–1000)

## 2016-09-18 LAB — AMYLASE, PLEURAL OR PERITONEAL FLUID: Amylase, Fluid: 26 U/L

## 2016-09-18 LAB — ALBUMIN, PLEURAL OR PERITONEAL FLUID: Albumin, Fluid: <1 g/dL

## 2016-09-18 LAB — GLUCOSE, PLEURAL OR PERITONEAL FLUID: GLUCOSE FL: 97 mg/dL

## 2016-09-18 LAB — GRAM STAIN

## 2016-09-18 LAB — PROTEIN, PLEURAL OR PERITONEAL FLUID

## 2016-09-18 LAB — PHOSPHORUS: Phosphorus: 3.8 mg/dL (ref 2.5–4.6)

## 2016-09-18 MED ORDER — DULOXETINE HCL 20 MG PO CPEP
40.0000 mg | ORAL_CAPSULE | Freq: Every day | ORAL | Status: DC
  Administered 2016-09-18 – 2016-09-28 (×11): 40 mg via ORAL
  Filled 2016-09-18 (×11): qty 2

## 2016-09-18 MED ORDER — SODIUM CHLORIDE 0.9 % IV SOLN
Freq: Once | INTRAVENOUS | Status: AC
  Administered 2016-09-18: 13:00:00 via INTRAVENOUS

## 2016-09-18 MED ORDER — FLUCONAZOLE IN SODIUM CHLORIDE 100-0.9 MG/50ML-% IV SOLN
100.0000 mg | INTRAVENOUS | Status: DC
  Administered 2016-09-18 – 2016-09-19 (×2): 100 mg via INTRAVENOUS
  Filled 2016-09-18 (×3): qty 50

## 2016-09-18 MED ORDER — LIDOCAINE HCL 1 % IJ SOLN
INTRAMUSCULAR | Status: DC | PRN
  Administered 2016-09-18: 10 mL

## 2016-09-18 MED ORDER — IPRATROPIUM-ALBUTEROL 0.5-2.5 (3) MG/3ML IN SOLN
3.0000 mL | Freq: Four times a day (QID) | RESPIRATORY_TRACT | Status: DC
  Administered 2016-09-18 – 2016-09-19 (×2): 3 mL via RESPIRATORY_TRACT
  Filled 2016-09-18 (×4): qty 3

## 2016-09-18 MED ORDER — FUROSEMIDE 10 MG/ML IJ SOLN
20.0000 mg | Freq: Once | INTRAMUSCULAR | Status: AC
  Administered 2016-09-18: 20 mg via INTRAVENOUS
  Filled 2016-09-18: qty 2

## 2016-09-18 MED ORDER — LIDOCAINE HCL (PF) 1 % IJ SOLN
INTRAMUSCULAR | Status: AC
  Filled 2016-09-18: qty 30

## 2016-09-18 MED ORDER — ARIPIPRAZOLE 5 MG PO TABS
5.0000 mg | ORAL_TABLET | Freq: Every day | ORAL | Status: DC
  Administered 2016-09-18 – 2016-09-30 (×13): 5 mg via ORAL
  Filled 2016-09-18 (×13): qty 1

## 2016-09-18 MED ORDER — ALPRAZOLAM 0.25 MG PO TABS
0.2500 mg | ORAL_TABLET | Freq: Two times a day (BID) | ORAL | Status: DC
  Administered 2016-09-18 – 2016-09-28 (×20): 0.25 mg via ORAL
  Filled 2016-09-18 (×20): qty 1

## 2016-09-18 NOTE — Progress Notes (Signed)
Patient legs are weeping so much that TED hose would not stay dry long enough to wear. Scd get wet and have to air dry them freq.

## 2016-09-18 NOTE — Procedures (Signed)
Ultrasound-guided diagnostic and therapeutic left thoracentesis performed yielding 0.65liters of clear pale yellow colored fluid. No immediate complications. Follow-up chest x-ray pending.       Bray Vickerman E 12:04 PM 09/18/2016

## 2016-09-18 NOTE — Progress Notes (Signed)
Enc. Patient to get o.o.b. this a.m. To chair for bkft " I am not going to do it." "I am tired and I am tired of trying." :" I am not getting up so do not come in here and try." Patient stated.I ask patient if she has given up and patient stated "Yes".

## 2016-09-18 NOTE — Consult Note (Signed)
[facility] Consult   Reason for Consult:  Severe depression Referring Physician:  Dr. Alfredia Ferguson Patient Identification: Haley Graham MRN:  [id number] Principal Diagnosis: Sepsis [facility]) Diagnosis:   Patient Active Problem List   Diagnosis Date Noted  . Protein-calorie malnutrition, severe [E43] 09/12/2016  . Sepsis ([place]) [A41.9] 09/04/2016  . Hypotension [I95.9] 09/10/2016  . AKI (acute kidney injury) ([place]) [N17.9] 09/20/2016  . Dehydration [E86.0] 09/02/2016  . Weakness [R53.1] 09/30/2016  . Anemia [D64.9] 09/21/2016  . Thrombocytopenia ([place]) [D69.6] 09/28/2016  . Abnormal computed tomography angiography (CTA) of abdomen and pelvis [R93.5] 09/07/2016  . Colitis: Probable [K52.9] 09/22/2016  . AAA (abdominal aortic aneurysm) ([place]) [I71.4]   . Abdominal pain [R10.9]   . Ischemic enteritis ([place]) [K55.9]   . Benign essential HTN [I10]   . Diabetes mellitus type 2 in obese (HCC) [E11.69, E66.9]   . Anxiety state [F41.1]   . Tachycardia [R00.0]   . Tachypnea [R06.82]   . Hyponatremia [E87.1]   . Leukocytosis [D72.829]   . Acute blood loss anemia [D62]   . 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 Unstable angina pectoris ([place]) [I20.0]   . SOB (shortness of breath) [R06.02]   . 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 Change in stool [R19.5] 05/28/2016  . Gastroparesis [K31.84] 11/19/2011  . Nausea [R11.0] 11/19/2011  . Dysphagia [R13.10] 11/11/2011  . GERD (gastroesophageal reflux disease) [K21.9] 11/11/2011    Total Time spent with patient: 1 hour  Subjective:   Haley Graham is a 61 y.o. female patient admitted with generalized weakness.  HPI:  Haley Graham is a 61 y.o. female, seen, chart reviewed for this face-to-face psychiatric consultation and evaluation of increased symptoms of depression. Patient reported her mom died 26-Feb-2016 with multiple medical problems. Patient has been depressed and  anxious, has been receiving outpatient medication management from primary care physician who started Celexa and Xanax which seems to be helped initially but not working any longer. Patient worried that she is not getting regular Xanax while in the hospital which is making her jitters. Patient has been sad, isolated, withdrawn, generalized weakness disturbed appetite and sleep. Patient stated that she has clear mind and able to make her own decisions she does not want anybody else make decision for her. Patient reported few months ago she called her husband and requested to call the ambulance when she had problem with her aorta dissection. Patient denied suicidal/homicidal ideations, intention or plans. Patient has no evidence of psychosis. Patient daughter who is at bedside left the room because patient does not want her daughter to be at bedside while talking with the psychiatrist. Patient is also feels that her daughter is trying to take power of attorney to make medical decisions for her.  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  Past Psychiatric History: Patient has no acute psychiatric hospitalization but receiving outpatient medication management from primary care physician for depression and anxiety  Risk to Self: Is patient at risk for suicide?: No Risk to Others:   Prior Inpatient Therapy:   Prior Outpatient Therapy:    Past Medical History:  Past Medical History:  Diagnosis Date  . Anxiety   . Arthritis   . Diabetes mellitus  without complication ([place])   . Gastroparesis   . GERD (gastroesophageal reflux disease)   . Hypertension   . IBS (irritable bowel  syndrome)     Past Surgical History:  Procedure Laterality Date  . ABDOMINAL HYSTERECTOMY    . AORTOGRAM N/A 07/24/2016   Procedure: [facility] AND ABDOMINAL AORTA  ANGIOGRAM;  Surgeon: Waynetta Sandy, MD;  Location: [facility];  Service: Vascular;  Laterality: N/A;  . CESAREAN SECTION    . CHOLECYSTECTOMY    . COLONOSCOPY    . COLONOSCOPY N/A 06/10/2016   Procedure: COLONOSCOPY;  Surgeon: Rogene Houston, MD;  [facility];  Service: Endoscopy;  Laterality: N/A;  8:30  . POLYPECTOMY  06/10/2016   Procedure: POLYPECTOMY;  Surgeon: Rogene Houston, MD;  [facility];  Service: Endoscopy;;  multiple colon  . THORACIC AORTIC ENDOVASCULAR STENT GRAFT N/A 08/06/2016   Procedure: THORACIC AORTIC ENDOVASCULAR STENT GRAFT/Thorasic and Abdominal Angiogram, Entravascular ultrasound., Open femoral exposure,Left Brachial access, and patch angioplasty right femoral artery.;  Surgeon: Serafina Mitchell, MD;  Location: [facility] OR;  Service: Vascular;  Laterality: N/A;  . UPPER GASTROINTESTINAL ENDOSCOPY     Family History:  Family History  Problem Relation Age of Onset  . Cancer Mother   . Stroke Mother   . Heart attack Mother   . Stroke Father    Family Psychiatric  History: Unknown Social History:  History  Alcohol Use No     History  Drug Use No    Social History   Social History  . Marital status: Married    Spouse name: N/A  . Number of children: N/A  . Years of education: N/A   Social History Main Topics  . Smoking status: Current Every Day Smoker    Packs/day: 0.75    Years: 23.00    Types: Cigarettes  . Smokeless tobacco: Never Used     Comment: 5-6 cigarettes. Cut back about 3 weeks ago. Smoking since age 53.  Marland Kitchen Alcohol use No  . Drug use: No  . Sexual activity: Not Asked   Other Topics Concern  . None   Social History Narrative  . None   Additional Social History:    Allergies:   Allergies  Allergen Reactions  . Penicillins Rash and Other (See  Comments)    PATIENT HAS HAD A PCN REACTION WITH IMMEDIATE RASH, FACIAL/TONGUE/THROAT SWELLING, SOB, OR LIGHTHEADEDNESS WITH HYPOTENSION:  #  #  #  YES  #  #  #   Has patient had a PCN reaction causing severe rash involving mucus membranes or skin necrosis: no Has patient had a PCN reaction that required hospitalization no Has patient had a PCN reaction occurring within the last 10 years:no If all of the above answers are "NO", then may proceed with Cephalosporin use.  . Ativan [Lorazepam] Other (See Comments)    Makes pt very confused and more agitated, irritable.  . Codeine Nausea And Vomiting  . Reglan [Metoclopramide] Other (See Comments)    TACHYCARDIA    Labs:  Results for orders placed or performed during the hospital encounter of 09/03/2016 (from the past 48 hour(s))  Glucose, capillary     Status: Abnormal   Collection Time: 09/16/16 12:34 PM  Result Value Ref Range   Glucose-Capillary 135 (H) 65 - 99 mg/dL   Comment 1 Notify RN    Comment 2 Document in Chart   Glucose, capillary     Status: Abnormal   Collection Time: 09/16/16  4:14 PM  Result Value Ref Range   Glucose-Capillary 106 (H) 65 - 99 mg/dL  Culture, blood (routine x 2)     Status: None (Preliminary result)   Collection Time: 09/16/16  9:34 PM  Result Value Ref Range   Specimen Description BLOOD RIGHT HAND    Special Requests IN PEDIATRIC BOTTLE Blood Culture adequate volume    Culture NO GROWTH < 12 HOURS    Report Status PENDING   Culture, blood (routine x 2)     Status: None (Preliminary result)   Collection Time: 09/16/16  9:34 PM  Result Value Ref Range   Specimen Description BLOOD RIGHT HAND    Special Requests IN PEDIATRIC BOTTLE Blood Culture adequate volume    Culture NO GROWTH < 12 HOURS    Report Status PENDING   Glucose, capillary     Status: Abnormal   Collection Time: 09/16/16  9:39 PM  Result Value Ref Range   Glucose-Capillary 126 (H) 65 - 99 mg/dL  CBC with Differential/Platelet      Status: Abnormal   Collection Time: 09/17/16  3:37 AM  Result Value Ref Range   WBC 19.9 (H) 4.0 - 10.5 K/uL   RBC 2.78 (L) 3.87 - 5.11 MIL/uL   Hemoglobin 8.9 (L) 12.0 - 15.0 g/dL   HCT 26.6 (L) 36.0 - 46.0 %   MCV 95.7 78.0 - 100.0 fL   MCH 32.0 26.0 - 34.0 pg   MCHC 33.5 30.0 - 36.0 g/dL   RDW 19.7 (H) 11.5 - 15.5 %   Platelets 103 (L) 150 - 400 K/uL    Comment: CONSISTENT WITH PREVIOUS RESULT   Neutrophils Relative % 85 %   Neutro Abs 17.0 (H) 1.7 - 7.7 K/uL   Lymphocytes Relative 11 %   Lymphs Abs 2.2 0.7 - 4.0 K/uL   Monocytes Relative 3 %   Monocytes Absolute 0.7 0.1 - 1.0 K/uL   Eosinophils Relative 0 %   Eosinophils Absolute 0.0 0.0 - 0.7 K/uL   Basophils Relative 0 %   Basophils Absolute 0.0 0.0 - 0.1 K/uL  Comprehensive metabolic panel     Status: Abnormal   Collection Time: 09/17/16  3:37 AM  Result Value Ref Range   Sodium 131 (L) 135 - 145 mmol/L   Potassium 3.7 3.5 - 5.1 mmol/L   Chloride 107 101 - 111 mmol/L   CO2 18 (L) 22 - 32 mmol/L   Glucose, Bld 102 (H) 65 - 99 mg/dL   BUN 20 6 - 20 mg/dL   Creatinine, Ser 1.38 (H) 0.44 - 1.00 mg/dL   Calcium 7.0 (L) 8.9 - 10.3 mg/dL   Total Protein 4.5 (L) 6.5 - 8.1 g/dL   Albumin 1.1 (L) 3.5 - 5.0 g/dL   AST 18 15 - 41 U/L   ALT 13 (L) 14 - 54 U/L   Alkaline Phosphatase 253 (H) 38 - 126 U/L   Total Bilirubin 0.8 0.3 - 1.2 mg/dL   GFR calc non Af Amer 41 (L) >60 mL/min   GFR calc Af Amer 47 (L) >60 mL/min    Comment: (NOTE) The eGFR has been calculated using the CKD EPI equation. This calculation has not been validated in all clinical situations. eGFR's persistently <60 mL/min signify possible Chronic Kidney Disease.    Anion gap 6 5 - 15  Magnesium     Status: Abnormal   Collection Time: 09/17/16  3:37 AM  Result Value Ref Range   Magnesium 1.5 (L) 1.7 - 2.4  mg/dL  Phosphorus     Status: None   Collection Time: 09/17/16  3:37 AM  Result Value Ref Range   Phosphorus 3.8 2.5 - 4.6 mg/dL  Glucose, capillary      Status: Abnormal   Collection Time: 09/17/16  6:14 AM  Result Value Ref Range   Glucose-Capillary 114 (H) 65 - 99 mg/dL  Glucose, capillary     Status: Abnormal   Collection Time: 09/17/16 11:11 AM  Result Value Ref Range   Glucose-Capillary 126 (H) 65 - 99 mg/dL   Comment 1 Notify RN    Comment 2 Document in Chart   Glucose, capillary     Status: Abnormal   Collection Time: 09/17/16  4:32 PM  Result Value Ref Range   Glucose-Capillary 128 (H) 65 - 99 mg/dL   Comment 1 Notify RN    Comment 2 Document in Chart   Glucose, capillary     Status: Abnormal   Collection Time: 09/17/16  9:05 PM  Result Value Ref Range   Glucose-Capillary 101 (H) 65 - 99 mg/dL  CBC with Differential/Platelet     Status: Abnormal   Collection Time: 09/18/16  2:55 AM  Result Value Ref Range   WBC 14.3 (H) 4.0 - 10.5 K/uL   RBC 2.10 (L) 3.87 - 5.11 MIL/uL   Hemoglobin 7.3 (L) 12.0 - 15.0 g/dL   HCT 20.6 (L) 36.0 - 46.0 %   MCV 98.1 78.0 - 100.0 fL   MCH 34.8 (H) 26.0 - 34.0 pg   MCHC 35.4 30.0 - 36.0 g/dL   RDW 20.9 (H) 11.5 - 15.5 %   Platelets 57 (L) 150 - 400 K/uL    Comment: REPEATED TO VERIFY PLATELET COUNT CONFIRMED BY SMEAR    Neutrophils Relative % 81 %   Lymphocytes Relative 15 %   Monocytes Relative 4 %   Eosinophils Relative 0 %   Basophils Relative 0 %   Neutro Abs 11.6 (H) 1.7 - 7.7 K/uL   Lymphs Abs 2.1 0.7 - 4.0 K/uL   Monocytes Absolute 0.6 0.1 - 1.0 K/uL   Eosinophils Absolute 0.0 0.0 - 0.7 K/uL   Basophils Absolute 0.0 0.0 - 0.1 K/uL   RBC Morphology TARGET CELLS    WBC Morphology MILD LEFT SHIFT (1-5% METAS, OCC MYELO, OCC BANDS)     Comment: TOXIC GRANULATION  Comprehensive metabolic panel     Status: Abnormal   Collection Time: 09/18/16  2:55 AM  Result Value Ref Range   Sodium 130 (L) 135 - 145 mmol/L   Potassium 3.9 3.5 - 5.1 mmol/L   Chloride 107 101 - 111 mmol/L   CO2 17 (L) 22 - 32 mmol/L   Glucose, Bld 84 65 - 99 mg/dL   BUN 25 (H) 6 - 20 mg/dL   Creatinine,  Ser 1.50 (H) 0.44 - 1.00 mg/dL   Calcium 6.9 (L) 8.9 - 10.3 mg/dL   Total Protein 3.7 (L) 6.5 - 8.1 g/dL   Albumin <1.0 (L) 3.5 - 5.0 g/dL   AST 19 15 - 41 U/L   ALT 10 (L) 14 - 54 U/L   Alkaline Phosphatase 249 (H) 38 - 126 U/L   Total Bilirubin 0.9 0.3 - 1.2 mg/dL   GFR calc non Af Amer 37 (L) >60 mL/min   GFR calc Af Amer 43 (L) >60 mL/min    Comment: (NOTE) The eGFR has been calculated using the CKD EPI equation. This calculation has not been validated in all clinical situations. eGFR's  persistently <60 mL/min signify possible Chronic Kidney Disease.    Anion gap 6 5 - 15  Magnesium     Status: None   Collection Time: 09/18/16  2:55 AM  Result Value Ref Range   Magnesium 1.8 1.7 - 2.4 mg/dL  Phosphorus     Status: None   Collection Time: 09/18/16  2:55 AM  Result Value Ref Range   Phosphorus 3.8 2.5 - 4.6 mg/dL  Glucose, capillary     Status: None   Collection Time: 09/18/16  5:58 AM  Result Value Ref Range   Glucose-Capillary 97 65 - 99 mg/dL  Type and screen [place]     Status: None (Preliminary result)   Collection Time: 09/18/16 10:17 AM  Result Value Ref Range   ABO/RH(D) O POS    Antibody Screen NEG    Sample Expiration 09/21/2016    Unit Number [id number]    Blood Component Type RED CELLS,LR    Unit division 00    Status of Unit ALLOCATED    Transfusion Status OK TO TRANSFUSE    Crossmatch Result COMPATIBLE     Current Facility-Administered Medications  Medication Dose Route Frequency Provider Last Rate Last Dose  . 0.9 %  sodium chloride infusion   Intravenous Once Sheikh, Georgina Quint Latif, DO      . acetaminophen (TYLENOL) tablet 650 mg  650 mg Oral Q6H PRN Eugenie Filler, MD   650 mg at 09/15/16 1208   Or  . acetaminophen (TYLENOL) suppository 650 mg  650 mg Rectal Q6H PRN Eugenie Filler, MD      . ALPRAZolam Duanne Moron) tablet 0.25 mg  0.25 mg Oral BID Ambrose Finland, MD      . alum & mag hydroxide-simeth  (MAALOX/MYLANTA) 200-200-20 MG/5ML suspension 30 mL  30 mL Oral BID PRN Eugenie Filler, MD   30 mL at 09/13/16 8333  . ARIPiprazole (ABILIFY) tablet 5 mg  5 mg Oral Daily Ambrose Finland, MD      . aspirin EC tablet 81 mg  81 mg Oral Daily Patrecia Pour, MD   81 mg at 09/18/16 1028  . ceFEPIme (MAXIPIME) 2 g in dextrose 5 % 50 mL IVPB  2 g Intravenous Q24H Skeet Simmer, RPH 100 mL/hr at 09/17/16 1800 2 g at 09/17/16 1800  . dronabinol (MARINOL) capsule 2.5 mg  2.5 mg Oral BID AC Sheikh, [place], DO      . DULoxetine (CYMBALTA) DR capsule 40 mg  40 mg Oral Daily Christopherjohn Schiele, Arbutus Ped, MD      . feeding supplement (ENSURE ENLIVE) (ENSURE ENLIVE) liquid 237 mL  237 mL Oral TID BM Mariel Aloe, MD   237 mL at 09/18/16 1031  . feeding supplement (PRO-STAT SUGAR FREE 64) liquid 30 mL  30 mL Oral BID Raiford Noble Latif, DO   30 mL at 09/18/16 1032  . fluconazole (DIFLUCAN) IVPB 100 mg  100 mg Intravenous Q24H Sheikh, Omair Latif, DO      . fluticasone [facility]) 50 MCG/ACT nasal spray 2 spray  2 spray Each Nare Daily PRN Eugenie Filler, MD      . furosemide (LASIX) injection 20 mg  20 mg Intravenous Once Sheikh, Omair Latif, DO      . insulin aspart (novoLOG) injection 0-5 Units  0-5 Units Subcutaneous QHS Eugenie Filler, MD   Stopped at 09/02/2016 2212  . insulin aspart (novoLOG) injection 0-9 Units  0-9 Units Subcutaneous TID WC Mariel Aloe,  MD   1 Units at 09/17/16 1730  . ipratropium-albuterol (DUONEB) 0.5-2.5 (3) MG/3ML nebulizer solution 3 mL  3 mL Nebulization Q6H Sheikh, Omair [place], DO   3 mL at 09/18/16 0941  . levalbuterol (XOPENEX) nebulizer solution 0.63 mg  0.63 mg Nebulization Q2H PRN Eugenie Filler, MD      . lidocaine (PF) (XYLOCAINE) 1 % injection           . lidocaine (XYLOCAINE) 1 % (with pres) injection   Infiltration PRN Saverio Danker, PA-C   10 mL at 09/18/16 1212  . loperamide (IMODIUM) capsule 4 mg  4 mg Oral PRN Mariel Aloe, MD   4 mg  at 09/13/16 2137  . metoprolol tartrate (LOPRESSOR) tablet 12.5 mg  12.5 mg Oral BID Mariel Aloe, MD   12.5 mg at 09/18/16 1028  . metroNIDAZOLE (FLAGYL) IVPB 500 mg  500 mg Intravenous Q8H Otilio Miu, [place]   Stopped at 09/18/16 1056  . morphine 2 MG/ML injection 2 mg  2 mg Intravenous Q6H PRN Sheikh, Omair Latif, DO      . ondansetron [facility]) injection 4 mg  4 mg Intravenous Q6H PRN Eugenie Filler, MD   4 mg at 09/18/16 1113  . ondansetron (ZOFRAN) tablet 4 mg  4 mg Oral Q8H PRN Eugenie Filler, MD   4 mg at 09/15/16 1002  . pantoprazole (PROTONIX) injection 40 mg  40 mg Intravenous Q24H Eugenie Filler, MD   40 mg at 09/17/16 2225  . senna-docusate (Senokot-S) tablet 1 tablet  1 tablet Oral QHS PRN Eugenie Filler, MD      . sodium chloride flush (NS) 0.9 % injection 3 mL  3 mL Intravenous Q12H Eugenie Filler, MD   3 mL at 09/17/16 1000  . sodium phosphate (FLEET) 7-19 GM/118ML enema 1 enema  1 enema Rectal Once PRN Eugenie Filler, MD      . sorbitol 70 % solution 30 mL  30 mL Oral Daily PRN Eugenie Filler, MD      . traMADol Veatrice Bourbon) tablet 50 mg  50 mg Oral Q6H PRN Schorr, Rhetta Mura, NP   50 mg at 09/18/16 1113    Musculoskeletal: Strength & Muscle Tone: decreased Gait & Station: unable to stand Patient leans: N/A  Psychiatric Specialty Exam: Physical Exam as per history and physical   ROS generalized weakness, anxious, depressed and sad low energy. Patient denied shortness of breath and chest pain. No Fever-chills, No Headache, No changes with Vision or hearing, reports vertigo No problems swallowing food or Liquids, No Chest pain, Cough or Shortness of Breath, No Abdominal pain, No Nausea or Vommitting, Bowel movements are regular, No Blood in stool or Urine, No dysuria, No new skin rashes or bruises, No new joints pains-aches,  No new weakness, tingling, numbness in any extremity, No recent weight gain or loss, No polyuria, polydypsia or  polyphagia,  A full 10 point Review of Systems was done, except as stated above, all other Review of Systems were negative.  Blood pressure (!) 101/52, pulse 86, temperature (!) 97.4 F (36.3 C), temperature source Oral, resp. rate 18, height _0  (1.6 m), weight 85.4 kg (188 lb 4.4 oz), SpO2 98 %.Body mass index is 33.35 kg/m.  General Appearance: Casual  Eye Contact:  Good  Speech:  Clear and Coherent and Slow  Volume:  Decreased  Mood:  Anxious and Depressed  Affect:  Constricted and Depressed  Thought Process:  Coherent  and Goal Directed  Orientation:  Full (Time, Place, and Person)  Thought Content:  Logical and indecisiveness noted.  Suicidal Thoughts:  No  Homicidal Thoughts:  No  Memory:  Immediate;   Fair Recent;   Fair Remote;   Fair  Judgement:  Fair  Insight:  Fair  Psychomotor Activity:  Psychomotor Retardation  Concentration:  Concentration: Fair and Attention Span: Fair  Recall:  [place] of Knowledge:  Good  Language:  Good  Akathisia:  Negative  Handed:  Right  AIMS (if indicated):     Assets:  Communication Skills Desire for Improvement Financial Resources/Insurance Housing Intimacy Leisure Time Resilience Social Support Transportation  ADL's:  Impaired  Cognition:  Impaired,  Mild  Sleep:        Treatment Plan Summary: 61 years old female presented with generalized weakness, depression, anxiety and increased psychomotor retardation. Patient has a status post aortic graft couple months ago to continue diuretic dissection  Maj. depressive disorder, recurrent Generalized anxiety disorder  Recommendation: Patient has no safety concerns Change Xanax 0.25 mg twice daily for anxiety Discontinue Celexa which is not helpful We start Cymbalta 40 mg daily for depression Daily contact with patient to assess and evaluate symptoms and progress in treatment and Medication management   Appreciate psychiatric consultation and follow up as clinically  required Please contact [phone] or [phone] if needs further assistance  Disposition: Patient does not meet criteria for psychiatric inpatient admission. Supportive therapy provided about ongoing stressors.  Ambrose Finland, MD 09/18/2016 12:20 PM

## 2016-09-18 NOTE — Progress Notes (Signed)
PROGRESS NOTE    Haley Graham  WNU:[id number] DOB: 11-Sep-1955 DOA: 09/20/2016 PCP: Glenda Chroman, MD   Brief Narrative: Haley Graham is a 61 y.o. 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 She presented with weakness and met sepsis criteria with concern for enteritis. Antibiotics started.   Patient has worsening Cr and Leukocytosis worsened and now improved. Complaining of Nausea and Abdominal Pain so will re-culture and repeat Imaging. Appears depressed and does not want to eat and just wants to lay in bed so will get Psych evaluation and place patient on Marinol. Discussed with Husband about Palliative Care Consult and he declined at this time. Patient refusing care and does not want to participate. Psych Evaluated and changed medications around. Patient also was found to have a Left Pleural Effusion and underwent Thoracentesis and also was transfused 1 unit of pRBCs.   Assessment & Plan:   Principal Problem:   Sepsis ([place]) Active Problems:   GERD (gastroesophageal reflux disease)   Dissection of thoracoabdominal aorta (HCC)   AAA (abdominal aortic aneurysm) (HCC)   Benign essential HTN   Diabetes mellitus type 2 in obese (HCC)   Anxiety state   Hyponatremia   Leukocytosis   Hypotension   AKI (acute kidney injury) ([place])   Dehydration   Weakness   Anemia   Thrombocytopenia (HCC)   Abnormal computed tomography angiography (CTA) of abdomen and pelvis   Colitis: Probable   Protein-calorie malnutrition, severe  Sepsis -Suspected Secondary to colitis/enteritis.  -WBC worsening and went from 16.7 -> 26.9 -> 19.9 -> 14.3 -Physiology improved with IV  fluids and antibiotics. Has some tachycardia.  -GI pathogen panel and C. Diff negative.  -Initial Urine/blood culture negative. Repeat U/A shows evidence of UTI with Many bacteria, Large Hb, Trace Leukocytes, Positive Nitrites, TNTC RBC, TNTC WBC -continue Flagyl and Cefepime for now and may need to escalate -Repeat Blood Cx -Repeat CT Chest/Abd/Pelvis w/o Contrast pending -Continue to Monitor  -Yeast grew un UTI so will treat with Fluconazole  -May consider Palliative Consult but patient's Husband does not want to pursue that avenue yet  Hypotension -Improved with IV fluids which has made her anasarca worse. Troponin negative. Albumin extremely low. -monitor BP  Dehydration/Poor oral intake/FTT -encourage oral intake -dietitian consulted: protein supplementation as patient has Significant Hypoalbuminemia and LE swelling  -Will add Marinol to help appetite Stimulant   Normocytic Anemia -Hb/Hct went from 8.6/26.2 -> 9.1/26.0 -> 8.9/26.6 -> 7.3/20.6 -Type and Screen and Transfuse 1 unit of pRBC -Has some Hematuria -repeat CBC in AM   COPD Stable. -continue nebulizers  Acute Kidney Injury -Suspected Secondary to hypotension and Hx of Atrophic Left Kidney -Increased Diurectics -BUN/Cr went from 16/1.12 -> 18/1.33 -> 20/1.38 -> 25/1.50 -Continue to Monitor and Avoid Nephrotoxics -Repeat CMP in AM  Anasarca -Secondary to IV fluids. Recent echo significant for grade 1 diastolic dysfunction. Repeat echocardiogram significant for an EF of 65-70% with grade 1 diastolic dysfunction and trace mitral regurg and tricuspid regurg -Lasix was increased to 40 mg daily -Albumin was <1.0 -CT scan showed Small amount of abdominal and pelvic ascites. Diffuse body wall edema consistent with anasarca  Oliguria -Improvement with diuresis -strict in/out -Patient +2.0 Liters since Admit  Yeast UTI ->100,000 CFU's -Treat With IV Fluconazole  Hematuria -Tea colored urine. Decreased  urine output initially which is now improved. Liver unremarkable for acute process. Vascular surgery without recommendations for thrombosis on consultation. -Monitor and repeated U/A today  -CT Scan showed Negative for nephrolithiasis, hydronephrosis or ureteral stone -May need to discuss with Urology  Hyponatremia -Mild at 131. Likely secondary to third spacing. -Continue to Monitor   Elevated alkaline phosphatase S/p cholecystecomy. GGT elevated. Alkaline phosphatase improving. RUQ unremarkable except for fatty liver  Initial Abnormal CT angiogram of chest/abdomen/pelvis -Thrombus of left renal artery. Vascular surgery consulted with no recommendations for acute management. -Repeat CT Scan showed Displaced wall calcification in the infrarenal abdominal aorta consistent with known thrombosed dissection.  Thrombocytopenia -Presumed secondary to acute illness.  -Has remained low and stable fluctuating between 60,000 and 80,000 and had improved to 115,000 but now trending down and went from 103,000 -> 57,000.  -Dr. Lonny Prude had discussed with hematologist, Dr. Julien Nordmann who recommends continued surveillance.  -Can follow-up as an outpatient if not improved by discharge. -Pathology smear review showed Leukocytosis with predominance of neutrophils with toxic features. Rare circulating myelocyte.   GERD Stable. -continue PPI  Depression/Anxiety -Discontinued Klonipin -Consulted Psychiatry Dr. Louretta Shorten and appreciated Assistance -Psychiatry changed Xanax 0.25 mg BID and discontinued Celexa; Patient was started on Cymbalta 40 mg po Daily for Depression   [place] type B aortic dissection -Patient is s/p stent graft placement on previous admission. -CT showed stable non contrasted appearance of the stent graft at the aortic arch and proximal descending thoracic aorta -Continue metoprolol at reduce dose  Tachycardia -Rebound secondary to abrupt cessation of  beta-blocker. -continue metoprolol at reduced dose of 12.64m BID  Leukocytosis -Patient without new source of infection but will reculture. Afebrile.  -WBC went from 26.9 -> 19.9 -> 14.3 -Inital Blood cultures negative and Repeat Pending but NGTD <12 Hours. Pro calcitonin 0.24 on admission. -Repeat Imaging showed Bilateral pleural effusions, small on the right and moderate on the left. Worsening consolidation in the lower lobes may reflect atelectasis or pneumonia. -Patient Currently on Abx as above  Hypokalemia -K+ Improved and now 3.7   Pleural Effusion on Left -U/S Guided Thoracentesis Ordered -Resulted 0.65 mL of Fluid Removal -Repeat CXR in AM  DVT prophylaxis: SCDs Code Status: FULL CODE Family Communication: Discussed with Husband over the Phone and spoke with daughter at bedside  Disposition Plan: Remain Inpatient and D/C to SNF  Consultants:   Vascular Surgery  Case was discussed with Dr. MJulien Nordmann  Psychiatry    Procedures:  ABI Left Thoracentesis    Antimicrobials:  Anti-infectives    Start     Dose/Rate Route Frequency Ordered Stop   09/18/16 1800  fluconazole (DIFLUCAN) IVPB 100 mg     100 mg 50 mL/hr over 60 Minutes Intravenous Every 24 hours 09/18/16 1109     09/17/16 1800  ceFEPIme (MAXIPIME) 2 g in dextrose 5 % 50 mL IVPB     2 g 100 mL/hr over 30 Minutes Intravenous Every 24 hours 09/17/16 1243     09/17/16 1800  fluconazole (DIFLUCAN) IVPB 200 mg  Status:  Discontinued     200 mg 100 mL/hr over 60 Minutes Intravenous Every 24 hours 09/17/16 1651 09/18/16 1109   09/12/16 1400  ceFEPIme (MAXIPIME) 1 g in dextrose 5 % 50 mL IVPB  Status:  Discontinued     1 g 100 mL/hr over 30 Minutes Intravenous Every 8 hours 09/12/16 1127 09/17/16 1243   09/11/16 1600  metroNIDAZOLE (FLAGYL) IVPB 500 mg  500 mg 100 mL/hr over 60 Minutes Intravenous Every 8 hours 09/11/16 0902     09/11/16 1400  ceFEPIme (MAXIPIME) 1 g in dextrose 5 % 50 mL IVPB  Status:   Discontinued     1 g 100 mL/hr over 30 Minutes Intravenous Every 24 hours 09/11/16 1234 09/12/16 1127   09/10/16 2000  vancomycin (VANCOCIN) IVPB 1000 mg/200 mL premix  Status:  Discontinued     1,000 mg 200 mL/hr over 60 Minutes Intravenous Every 24 hours 09/28/2016 2130 09/10/16 1616   09/10/16 1800  vancomycin (VANCOCIN) IVPB 750 mg/150 ml premix  Status:  Discontinued     750 mg 150 mL/hr over 60 Minutes Intravenous Every 12 hours 09/10/16 1616 09/12/16 1145   09/10/16 1400  levofloxacin (LEVAQUIN) IVPB 500 mg  Status:  Discontinued     500 mg 100 mL/hr over 60 Minutes Intravenous Every 24 hours 09/29/2016 2112 09/11/16 1220   09/10/16 0115  vancomycin (VANCOCIN) IVPB 1000 mg/200 mL premix  Status:  Discontinued     1,000 mg 200 mL/hr over 60 Minutes Intravenous  Once 09/10/16 0110 09/10/16 0118   09/21/2016 2200  metroNIDAZOLE (FLAGYL) IVPB 500 mg  Status:  Discontinued     500 mg 100 mL/hr over 60 Minutes Intravenous Every 6 hours 09/02/2016 2110 09/11/16 0902   09/02/2016 2115  vancomycin (VANCOCIN) IVPB 1000 mg/200 mL premix     1,000 mg 200 mL/hr over 60 Minutes Intravenous  Once 09/30/2016 2108 09/18/2016 2323   09/21/2016 1430  levofloxacin (LEVAQUIN) IVPB 750 mg     750 mg 100 mL/hr over 90 Minutes Intravenous  Once 09/14/2016 1415 09/13/2016 2152   09/18/2016 1430  metroNIDAZOLE (FLAGYL) IVPB 500 mg     500 mg 100 mL/hr over 60 Minutes Intravenous  Once 09/10/2016 1415 09/29/2016 1850   09/20/2016 1430  levofloxacin (LEVAQUIN) IVPB 750 mg  Status:  Discontinued     750 mg 100 mL/hr over 90 Minutes Intravenous  Once 09/25/2016 1416 09/25/2016 1416     Subjective: Seen and examined and still felt weak. Wanted the bed pan and did not want to sit in chair. Refused Therapies and refusing to participate in care and is not motivated. States she is too weak to move and it hurts. Denied SOB. No other concerns or complaints.   Objective: Vitals:   09/18/16 1300 09/18/16 1441 09/18/16 1528 09/18/16 1756  BP:  (!) 90/52 (!) 93/37 (!) 95/48   Pulse: 80 81    Resp: 16  16   Temp: 97.6 F (36.4 C)  97.9 F (36.6 C)   TempSrc: Oral  Oral   SpO2: 99% 98%    Weight:    79.3 kg (174 lb 14.4 oz)  Height:        Intake/Output Summary (Last 24 hours) at 09/18/16 1853 Last data filed at 09/18/16 1720  Gross per 24 hour  Intake             2291 ml  Output             1550 ml  Net              741 ml   Filed Weights   09/17/16 0548 09/18/16 0411 09/18/16 1756  Weight: 79.7 kg (175 lb 12.8 oz) 85.4 kg (188 lb 4.4 oz) 79.3 kg (174 lb 14.4 oz)   Examination: Physical Exam:  Constitutional: Depressed Caucasian female who does not want to particpate in care and refusing therapies. States she is  not motivated.  Eyes: Sclerae anicteric. Conjunctivae non-injected ENMT: Mucous membranes appear moist. Grossly normal hearing Neck: Supple with no JVD Respiratory: Diminished to auscultation. No wheezing but had some crackles. Cardiovascular: RRR; S1 S2; 3+ LE edema Abdomen: Soft, Tender to Palpate. Distended due to body habitus GU: Deferred Musculoskeletal: No contractures; No cyanosis Skin: Warm and dry. Has some ecchymosis on lower extremities Neurologic: CN 2-12 grossly intact. No appreciable focal deficits Psychiatric: Depressed mood and flat affect. Intact judgement and insight  Data Reviewed: I have personally reviewed following labs and imaging studies  CBC:  Recent Labs Lab 09/14/16 0728 09/15/16 0210 09/16/16 0331 09/17/16 0337 09/18/16 0255  WBC 21.6* 16.7* 26.9* 19.9* 14.3*  NEUTROABS  --   --   --  17.0* 11.6*  HGB 11.3* 8.6* 9.1* 8.9* 7.3*  HCT 35.2* 26.2* 26.0* 26.6* 20.6*  MCV 93.6 95.6 95.9 95.7 98.1  PLT 81* 66* 115* 103* 57*   Basic Metabolic Panel:  Recent Labs Lab 09/14/16 0728 09/15/16 0210 09/16/16 0331 09/17/16 0337 09/18/16 0255  NA 132* 132* 130* 131* 130*  K 4.0 3.2* 4.4 3.7 3.9  CL 107 109 105 107 107  CO2 17* 18* 16* 18* 17*  GLUCOSE 110* 98 107* 102*  84  BUN _0 25*  CREATININE 1.21* 1.12* 1.33* 1.38* 1.50*  CALCIUM 7.1* 6.7* 6.9* 7.0* 6.9*  MG  --   --   --  1.5* 1.8  PHOS  --   --   --  3.8 3.8   GFR: Estimated Creatinine Clearance: 39.8 mL/min (A) (by C-G formula based on SCr of 1.5 mg/dL (H)). Liver Function Tests:  Recent Labs Lab 09/12/16 1059 09/13/16 1755 09/15/16 0834 09/17/16 0337 09/18/16 0255  AST  --  _1 ALT  --  13* 11* 13* 10*  ALKPHOS  --  270* 202* 253* 249*  BILITOT 0.8 0.9 0.8 0.8 0.9  PROT  --  4.9* 4.1* 4.5* 3.7*  ALBUMIN  --  1.3* 1.1* 1.1* <1.0*   No results for input(s): LIPASE, AMYLASE in the last 168 hours. No results for input(s): AMMONIA in the last 168 hours. Coagulation Profile: No results for input(s): INR, PROTIME in the last 168 hours. Cardiac Enzymes:  Recent Labs Lab 09/13/16 1755  CKTOTAL 13*   BNP (last 3 results) No results for input(s): PROBNP in the last 8760 hours. HbA1C: No results for input(s): HGBA1C in the last 72 hours. CBG:  Recent Labs Lab 09/17/16 1111 09/17/16 1632 09/17/16 2105 09/18/16 0558 09/18/16 1626  GLUCAP 126* 128* 101* 97 112*   Lipid Profile: No results for input(s): CHOL, HDL, LDLCALC, TRIG, CHOLHDL, LDLDIRECT in the last 72 hours. Thyroid Function Tests: No results for input(s): TSH, T4TOTAL, FREET4, T3FREE, THYROIDAB in the last 72 hours. Anemia Panel: No results for input(s): VITAMINB12, FOLATE, FERRITIN, TIBC, IRON, RETICCTPCT in the last 72 hours. Sepsis Labs: No results for input(s): PROCALCITON, LATICACIDVEN in the last 168 hours.  Recent Results (from the past 240 hour(s))  Gastrointestinal Panel by PCR , Stool     Status: None   Collection Time: 09/14/2016  1:30 AM  Result Value Ref Range Status   Campylobacter species NOT DETECTED NOT DETECTED Final   Plesimonas shigelloides NOT DETECTED NOT DETECTED Final   Salmonella species NOT DETECTED NOT DETECTED Final   Yersinia enterocolitica NOT DETECTED NOT DETECTED  Final   Vibrio species NOT DETECTED NOT DETECTED Final   Vibrio cholerae NOT DETECTED NOT  DETECTED Final   Enteroaggregative E coli (EAEC) NOT DETECTED NOT DETECTED Final   Enteropathogenic E coli (EPEC) NOT DETECTED NOT DETECTED Final   Enterotoxigenic E coli (ETEC) NOT DETECTED NOT DETECTED Final   Shiga like toxin producing E coli (STEC) NOT DETECTED NOT DETECTED Final   Shigella/Enteroinvasive E coli (EIEC) NOT DETECTED NOT DETECTED Final   Cryptosporidium NOT DETECTED NOT DETECTED Final   Cyclospora cayetanensis NOT DETECTED NOT DETECTED Final   Entamoeba histolytica NOT DETECTED NOT DETECTED Final   Giardia lamblia NOT DETECTED NOT DETECTED Final   Adenovirus F40/41 NOT DETECTED NOT DETECTED Final   Astrovirus NOT DETECTED NOT DETECTED Final   Norovirus GI/GII NOT DETECTED NOT DETECTED Final   Rotavirus A NOT DETECTED NOT DETECTED Final   Sapovirus (I, II, IV, and V) NOT DETECTED NOT DETECTED Final  C difficile quick scan w PCR reflex     Status: None   Collection Time: 09/26/2016  1:30 AM  Result Value Ref Range Status   C Diff antigen NEGATIVE NEGATIVE Final   C Diff toxin NEGATIVE NEGATIVE Final   C Diff interpretation No C. difficile detected.  Final  Blood Culture (routine x 2)     Status: None   Collection Time: 09/16/2016  2:09 PM  Result Value Ref Range Status   Specimen Description BLOOD RIGHT ANTECUBITAL  Final   Special Requests   Final    BOTTLES DRAWN AEROBIC AND ANAEROBIC Blood Culture adequate volume   Culture   Final    NO GROWTH 5 DAYS Performed at [facility], [address] [place], [place] [address]    Report Status 09/14/2016 FINAL  Final  Culture, Urine     Status: None   Collection Time: 09/06/2016  9:40 PM  Result Value Ref Range Status   Specimen Description URINE, CLEAN CATCH  Final   Special Requests NONE  Final   Culture   Final    NO GROWTH Performed at [facility], [place] [address], [place], [place] [address]    Report Status  09/11/2016 FINAL  Final  Culture, Urine     Status: Abnormal   Collection Time: 09/16/16 10:49 AM  Result Value Ref Range Status   Specimen Description URINE, CLEAN CATCH  Final   Special Requests NONE  Final   Culture >=100,000 COLONIES/mL YEAST (A)  Final   Report Status 09/17/2016 FINAL  Final  Culture, blood (routine x 2)     Status: None (Preliminary result)   Collection Time: 09/16/16  9:34 PM  Result Value Ref Range Status   Specimen Description BLOOD RIGHT HAND  Final   Special Requests IN PEDIATRIC BOTTLE Blood Culture adequate volume  Final   Culture NO GROWTH 2 DAYS  Final   Report Status PENDING  Incomplete  Culture, blood (routine x 2)     Status: None (Preliminary result)   Collection Time: 09/16/16  9:34 PM  Result Value Ref Range Status   Specimen Description BLOOD RIGHT HAND  Final   Special Requests IN PEDIATRIC BOTTLE Blood Culture adequate volume  Final   Culture NO GROWTH 2 DAYS  Final   Report Status PENDING  Incomplete  Gram stain     Status: None   Collection Time: 09/18/16 12:14 PM  Result Value Ref Range Status   Specimen Description PLEURAL LEFT  Final   Special Requests NONE  Final   Gram Stain   Final    MODERATE WBC PRESENT, PREDOMINANTLY PMN NO  ORGANISMS SEEN    Report Status 09/18/2016 FINAL  Final    Radiology Studies: Ct Abdomen Pelvis Wo Contrast  Result Date: 09/18/2016 CLINICAL DATA:  Nausea and vomiting, hematuria EXAM: CT CHEST, ABDOMEN AND PELVIS WITHOUT CONTRAST TECHNIQUE: Multidetector CT imaging of the chest, abdomen and pelvis was performed following the standard protocol without IV contrast. COMPARISON:  Ultrasound 09/14/2016, CT chest 09/03/2016, CT abdomen pelvis 07/18/2016, CT abdomen pelvis 10/27/2011 FINDINGS: CT CHEST FINDINGS Cardiovascular: Limited without contrast. Aortic stent graft re- demonstrated at the level of the aortic arch and terminating in the descending thoracic aorta for known type B dissection. Similar caliber of  thoracic aorta. Aortic atherosclerotic calcification. Normal heart size. No significant pericardial effusion. Mediastinum/Nodes: Midline trachea. No thyroid mass. No significant mediastinal adenopathy. Esophagus within normal limits Lungs/Pleura: Bilateral pleural effusions, small on the right, moderate on the left, increased compared to prior chest CT. Increased consolidations within the bilateral lower lobes and lingula. No pneumothorax. Musculoskeletal: No chest wall mass or suspicious bone lesions identified. CT ABDOMEN PELVIS FINDINGS Hepatobiliary: Hepatic steatosis. Surgical clips in the gallbladder fossa. Negative for biliary dilatation Pancreas: Unremarkable. No pancreatic ductal dilatation or surrounding inflammatory changes. Spleen: Normal in size without focal abnormality. Adrenals/Urinary Tract: Right adrenal gland normal. Stable 2.2 cm left adrenal nodule. Negative for hydronephrosis. Atrophy left kidney. 1 cm cyst anterior mid left kidney. Intrarenal vascular calcification on the right. Negative for nephrolithiasis, hydronephrosis or ureteral stone. Foley catheter in the bladder with minimal air. Bladder is empty Stomach/Bowel: Stomach nonenlarged. Diffuse fluid-filled small and large bowel without obstruction or significant wall thickening. Vascular/Lymphatic: Non aneurysmal aorta. Displaced wall calcification at the infrarenal abdominal aorta, corresponding to thrombosed dissection on prior CTs. No significantly enlarged abdominal or pelvic lymph nodes. Reproductive: Status post hysterectomy. No adnexal masses. Other: Negative for free air. Development of small amount of abdominal and pelvic ascites. Diffuse body wall edema. Musculoskeletal: No acute or suspicious abnormalities. IMPRESSION: 1. Bilateral pleural effusions, small on the right and moderate on the left. Worsening consolidation in the lower lobes may reflect atelectasis or pneumonia. 2. Stable non contrasted appearance of the stent graft  at the aortic arch and proximal descending thoracic aorta. 3. Negative for nephrolithiasis, hydronephrosis or ureteral stone. 4. Hepatic steatosis. 5. 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 Displaced wall calcification in the infrarenal abdominal aorta consistent with known thrombosed dissection. 7. Small amount of abdominal and pelvic ascites. Diffuse body wall edema consistent with anasarca 8. Atrophic left kidney Electronically Signed   By: Donavan Foil M.D.   On: 09/18/2016 00:27   Dg Chest 1 View  Result Date: 09/18/2016 CLINICAL DATA:  Status post left thoracentesis today. EXAM: CHEST 1 VIEW COMPARISON:  CT chest 09/17/2016.  PA and lateral chest 09/15/2016. FINDINGS: Very small left pleural effusion is identified. No pneumothorax. Subsegmental atelectasis is present in the left lung base. The right lung is clear. Heart size is normal. Aortic stent graft noted. IMPRESSION: Minimal left pleural effusion after thoracentesis. Negative for pneumothorax. Electronically Signed   By: Inge Rise M.D.   On: 09/18/2016 12:21   Ct Chest Wo Contrast  Result Date: 09/18/2016 CLINICAL DATA:  Nausea and vomiting, hematuria EXAM: CT CHEST, ABDOMEN AND PELVIS WITHOUT CONTRAST TECHNIQUE: Multidetector CT imaging of the chest, abdomen and pelvis was performed following the standard protocol without IV contrast. COMPARISON:  Ultrasound 09/14/2016, CT chest 09/05/2016, CT abdomen pelvis 07/18/2016, CT abdomen pelvis 10/27/2011 FINDINGS: CT CHEST FINDINGS Cardiovascular: Limited without contrast. Aortic  stent graft re- demonstrated at the level of the aortic arch and terminating in the descending thoracic aorta for known type B dissection. Similar caliber of thoracic aorta. Aortic atherosclerotic calcification. Normal heart size. No significant pericardial effusion. Mediastinum/Nodes: Midline trachea. No thyroid mass. No significant mediastinal adenopathy. Esophagus within normal limits Lungs/Pleura:  Bilateral pleural effusions, small on the right, moderate on the left, increased compared to prior chest CT. Increased consolidations within the bilateral lower lobes and lingula. No pneumothorax. Musculoskeletal: No chest wall mass or suspicious bone lesions identified. CT ABDOMEN PELVIS FINDINGS Hepatobiliary: Hepatic steatosis. Surgical clips in the gallbladder fossa. Negative for biliary dilatation Pancreas: Unremarkable. No pancreatic ductal dilatation or surrounding inflammatory changes. Spleen: Normal in size without focal abnormality. Adrenals/Urinary Tract: Right adrenal gland normal. Stable 2.2 cm left adrenal nodule. Negative for hydronephrosis. Atrophy left kidney. 1 cm cyst anterior mid left kidney. Intrarenal vascular calcification on the right. Negative for nephrolithiasis, hydronephrosis or ureteral stone. Foley catheter in the bladder with minimal air. Bladder is empty Stomach/Bowel: Stomach nonenlarged. Diffuse fluid-filled small and large bowel without obstruction or significant wall thickening. Vascular/Lymphatic: Non aneurysmal aorta. Displaced wall calcification at the infrarenal abdominal aorta, corresponding to thrombosed dissection on prior CTs. No significantly enlarged abdominal or pelvic lymph nodes. Reproductive: Status post hysterectomy. No adnexal masses. Other: Negative for free air. Development of small amount of abdominal and pelvic ascites. Diffuse body wall edema. Musculoskeletal: No acute or suspicious abnormalities. IMPRESSION: 1. Bilateral pleural effusions, small on the right and moderate on the left. Worsening consolidation in the lower lobes may reflect atelectasis or pneumonia. 2. Stable non contrasted appearance of the stent graft at the aortic arch and proximal descending thoracic aorta. 3. Negative for nephrolithiasis, hydronephrosis or ureteral stone. 4. Hepatic steatosis. 5. 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 Displaced wall calcification in the  infrarenal abdominal aorta consistent with known thrombosed dissection. 7. Small amount of abdominal and pelvic ascites. Diffuse body wall edema consistent with anasarca 8. Atrophic left kidney Electronically Signed   By: Donavan Foil M.D.   On: 09/18/2016 00:27   Ir Thoracentesis Asp Pleural Space W/img Guide  Result Date: 09/18/2016 INDICATION: Patient with failure to thrive and recent leukocytosis with no definite source. Recent imaging revealed a left-sided pleural effusion. Request is made for diagnostic and therapeutic thoracentesis. EXAM: ULTRASOUND GUIDED DIAGNOSTIC AND THERAPEUTIC THORACENTESIS MEDICATIONS: 1% lidocaine COMPLICATIONS: None immediate. PROCEDURE: An ultrasound guided thoracentesis was thoroughly discussed with the patient and questions answered. The benefits, risks, alternatives and complications were also discussed. The patient understands and wishes to proceed with the procedure. Written consent was obtained. Ultrasound was performed to localize and mark an adequate pocket of fluid in the left chest. The area was then prepped and draped in the normal sterile fashion. 1% Lidocaine was used for local anesthesia. Under ultrasound guidance a Safe-T-Centesis catheter was introduced. Thoracentesis was performed. The catheter was removed and a dressing applied. FINDINGS: A total of approximately 0.65 L of clear pale yellow fluid was removed. Samples were sent to the laboratory as requested by the clinical team. IMPRESSION: Successful ultrasound guided left thoracentesis yielding 0.65 L of pleural fluid. Read by: Saverio Danker, PA-C Electronically Signed   By: Corrie Mckusick D.O.   On: 09/18/2016 12:28   Scheduled Meds: . ALPRAZolam  0.25 mg Oral BID  . ARIPiprazole  5 mg Oral Daily  . aspirin EC  81 mg Oral Daily  . dronabinol  2.5 mg Oral BID AC  .  DULoxetine  40 mg Oral Daily  . feeding supplement (ENSURE ENLIVE)  237 mL Oral TID BM  . feeding supplement (PRO-STAT SUGAR FREE 64)  30  mL Oral BID  . insulin aspart  0-5 Units Subcutaneous QHS  . insulin aspart  0-9 Units Subcutaneous TID WC  . ipratropium-albuterol  3 mL Nebulization Q6H  . lidocaine (PF)      . metoprolol tartrate  12.5 mg Oral BID  . pantoprazole (PROTONIX) IV  40 mg Intravenous Q24H  . sodium chloride flush  3 mL Intravenous Q12H   Continuous Infusions: . ceFEPime (MAXIPIME) IV Stopped (09/18/16 1750)  . fluconazole (DIFLUCAN) IV 100 mg (09/18/16 1800)  . metronidazole Stopped (09/18/16 1657)    LOS: 9 days   Kerney Elbe, DO [facility] Pager [phone]  If 7PM-7AM, please contact night-coverage [email or web address] Password [facility] 09/18/2016, 6:53 PM

## 2016-09-18 NOTE — Progress Notes (Signed)
PT Cancellation Note  Patient Details Name: Haley Graham MRN: [id number] DOB: [birth year]   Cancelled Treatment:    Reason Eval/Treat Not Completed: Patient declined, no reason specified;Fatigue/lethargy limiting ability to participate.  Patient declined to participate with PT today after max encouragement.  Patient states "I'm not seeing anyone now."  Will return at later date for PT session.   Despina Pole 09/18/2016, 3:02 PM Carita Pian Sanjuana Kava, [place] Pager [phone]

## 2016-09-18 NOTE — Progress Notes (Signed)
OT Cancellation Note  Patient Details Name: Haley Graham MRN: [id number] DOB: [birth year]   Cancelled Treatment:    Reason Eval/Treat Not Completed: Fatigue/lethargy limiting ability to participate.  Pt adamantly refusing despite encouragement.  Will reattempt next week.  Kashae Carstens [place], OTR/L [phone]   Lucille Passy M 09/18/2016, 1:36 PM

## 2016-09-19 ENCOUNTER — Inpatient Hospital Stay (HOSPITAL_COMMUNITY): Payer: BLUE CROSS/BLUE SHIELD

## 2016-09-19 DIAGNOSIS — E876 Hypokalemia: Secondary | ICD-10-CM

## 2016-09-19 DIAGNOSIS — E119 Type 2 diabetes mellitus without complications: Secondary | ICD-10-CM

## 2016-09-19 DIAGNOSIS — J449 Chronic obstructive pulmonary disease, unspecified: Secondary | ICD-10-CM

## 2016-09-19 DIAGNOSIS — B3749 Other urogenital candidiasis: Secondary | ICD-10-CM

## 2016-09-19 DIAGNOSIS — B49 Unspecified mycosis: Secondary | ICD-10-CM

## 2016-09-19 LAB — CBC WITH DIFFERENTIAL/PLATELET
BASOS PCT: 0 %
Basophils Absolute: 0 10*3/uL (ref 0.0–0.1)
EOS ABS: 0 10*3/uL (ref 0.0–0.7)
Eosinophils Relative: 0 %
HEMATOCRIT: 28.7 % — AB (ref 36.0–46.0)
Hemoglobin: 9.4 g/dL — ABNORMAL LOW (ref 12.0–15.0)
Lymphocytes Relative: 11 %
Lymphs Abs: 1.6 10*3/uL (ref 0.7–4.0)
MCH: 30.9 pg (ref 26.0–34.0)
MCHC: 33.4 g/dL (ref 30.0–36.0)
MCV: 92.3 fL (ref 78.0–100.0)
MONO ABS: 0.6 10*3/uL (ref 0.1–1.0)
MONOS PCT: 4 %
NEUTROS PCT: 85 %
Neutro Abs: 12.6 10*3/uL — ABNORMAL HIGH (ref 1.7–7.7)
Platelets: 64 10*3/uL — ABNORMAL LOW (ref 150–400)
RBC: 3.11 MIL/uL — ABNORMAL LOW (ref 3.87–5.11)
RDW: 19.1 % — AB (ref 11.5–15.5)
WBC: 14.8 10*3/uL — AB (ref 4.0–10.5)

## 2016-09-19 LAB — COMPREHENSIVE METABOLIC PANEL
ALBUMIN: 1 g/dL — AB (ref 3.5–5.0)
ALT: 10 U/L — ABNORMAL LOW (ref 14–54)
ANION GAP: 5 (ref 5–15)
AST: 19 U/L (ref 15–41)
Alkaline Phosphatase: 292 U/L — ABNORMAL HIGH (ref 38–126)
BILIRUBIN TOTAL: 0.9 mg/dL (ref 0.3–1.2)
BUN: 27 mg/dL — ABNORMAL HIGH (ref 6–20)
CALCIUM: 7 mg/dL — AB (ref 8.9–10.3)
CO2: 19 mmol/L — ABNORMAL LOW (ref 22–32)
Chloride: 106 mmol/L (ref 101–111)
Creatinine, Ser: 1.4 mg/dL — ABNORMAL HIGH (ref 0.44–1.00)
GFR calc non Af Amer: 40 mL/min — ABNORMAL LOW (ref >60)
GFR, EST AFRICAN AMERICAN: 46 mL/min — AB (ref >60)
GLUCOSE: 103 mg/dL — AB (ref 65–99)
POTASSIUM: 3 mmol/L — AB (ref 3.5–5.1)
SODIUM: 130 mmol/L — AB (ref 135–145)
TOTAL PROTEIN: 4.1 g/dL — AB (ref 6.5–8.1)

## 2016-09-19 LAB — GLUCOSE, CAPILLARY
GLUCOSE-CAPILLARY: 106 mg/dL — AB (ref 65–99)
GLUCOSE-CAPILLARY: 119 mg/dL — AB (ref 65–99)
GLUCOSE-CAPILLARY: 115 mg/dL — AB (ref 65–99)
Glucose-Capillary: 114 mg/dL — ABNORMAL HIGH (ref 65–99)

## 2016-09-19 LAB — TYPE AND SCREEN
ABO/RH(D): O POS
Antibody Screen: NEGATIVE
Unit division: 0

## 2016-09-19 LAB — PHOSPHORUS: PHOSPHORUS: 3.5 mg/dL (ref 2.5–4.6)

## 2016-09-19 LAB — BPAM RBC
BLOOD PRODUCT EXPIRATION DATE: 201809122359
ISSUE DATE / TIME: 201808171248
Unit Type and Rh: 5100

## 2016-09-19 LAB — MAGNESIUM: Magnesium: 1.9 mg/dL (ref 1.7–2.4)

## 2016-09-19 MED ORDER — SODIUM CHLORIDE 0.9 % IV SOLN
200.0000 mg | Freq: Once | INTRAVENOUS | Status: AC
  Administered 2016-09-19: 200 mg via INTRAVENOUS
  Filled 2016-09-19: qty 200

## 2016-09-19 MED ORDER — POTASSIUM CHLORIDE CRYS ER 20 MEQ PO TBCR
40.0000 meq | EXTENDED_RELEASE_TABLET | Freq: Two times a day (BID) | ORAL | Status: DC
  Administered 2016-09-19 – 2016-09-20 (×4): 40 meq via ORAL
  Filled 2016-09-19 (×4): qty 2

## 2016-09-19 MED ORDER — SODIUM CHLORIDE 0.9 % IV SOLN
100.0000 mg | INTRAVENOUS | Status: DC
  Administered 2016-09-19: 100 mg via INTRAVENOUS
  Filled 2016-09-19: qty 100

## 2016-09-19 MED ORDER — ALBUMIN HUMAN 25 % IV SOLN
25.0000 g | Freq: Once | INTRAVENOUS | Status: AC
  Administered 2016-09-19: 25 g via INTRAVENOUS
  Filled 2016-09-19: qty 50

## 2016-09-19 MED ORDER — FUROSEMIDE 40 MG PO TABS
40.0000 mg | ORAL_TABLET | Freq: Every day | ORAL | Status: DC
  Administered 2016-09-19 – 2016-09-26 (×8): 40 mg via ORAL
  Filled 2016-09-19 (×8): qty 1

## 2016-09-19 MED ORDER — SODIUM CHLORIDE 0.9 % IV SOLN: 50.0000 mg | INTRAVENOUS | Status: DC

## 2016-09-19 MED ORDER — IPRATROPIUM-ALBUTEROL 0.5-2.5 (3) MG/3ML IN SOLN
3.0000 mL | RESPIRATORY_TRACT | Status: DC | PRN
  Administered 2016-10-15 – 2016-10-23 (×3): 3 mL via RESPIRATORY_TRACT
  Filled 2016-09-19 (×4): qty 3

## 2016-09-19 NOTE — Progress Notes (Signed)
PROGRESS NOTE    Haley Graham  TUU:[id number] DOB: 01-Feb-1956 DOA: 09/16/2016 PCP: Glenda Chroman, MD   Brief Narrative: Haley Graham is a 61 y.o. 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 She presented with weakness and met sepsis criteria with concern for enteritis. Antibiotics started.   Patient has worsening Cr and Leukocytosis worsened and now improved. Complaining of Nausea and Abdominal Pain so will re-culture and repeat Imaging. Appears depressed and does not want to eat and just wants to lay in bed so will get Psych evaluation and place patient on Marinol. Discussed with Husband about Palliative Care Consult and he declined at this time. Patient refusing care and does not want to participate. Psych Evaluated and changed medications around. Patient also was found to have a Left Pleural Effusion and underwent Thoracentesis and also was transfused 1 unit of pRBCs on 09/18/16.   This AM she was found to have a Fungemia with 1/2 Blood Cx growing Yeast so Fluconazole was D/C'd and patient changed to Eraxis. Infectious Diseases was consulted and recommended getting repeat Imaging of Graft as she was high risk of Seeding. Sensitivities of Yeast still pending and per ID she will likely need prolonged antifungals with her stent and graft.   Assessment & Plan:   Principal Problem:   Sepsis ([place]) Active Problems:   GERD (gastroesophageal reflux disease)   Dissection of thoracoabdominal aorta (HCC)   AAA (abdominal aortic aneurysm) (HCC)   Benign essential HTN   Diabetes mellitus type 2 in obese (HCC)   Anxiety state   Hyponatremia   Leukocytosis   Hypotension  AKI (acute kidney injury) ([place])   Dehydration   Weakness   Anemia   Thrombocytopenia (HCC)   Abnormal computed tomography angiography (CTA) of abdomen and pelvis   Colitis: Probable   Protein-calorie malnutrition, severe  Sepsis -Suspected Secondary to colitis/enteritis but patient found to have a Bacteremia with Yeast -WBC worsening and went from 16.7 -> 26.9 -> 19.9 -> 14.3 -> 14.8 -Physiology improved with IV fluids and antibiotics. Has some tachycardia.  -GI pathogen panel and C. Diff negative.  -Initial Urine/blood culture negative. Repeat U/A shows evidence of UTI with Many bacteria, Large Hb, Trace Leukocytes, Positive Nitrites, TNTC RBC, TNTC WBC -Continue Flagyl and Cefepime for now and may need to escalate -Repeat Blood Cx grew 1/2 Yeast; Will need repeat Blood Cx in Am  -Repeat CT Chest/Abd/Pelvis w/o Contrast done and showed Pleural Effusion -Continue to Monitor  -Yeast grew un UTI so will treat with Fluconazole; Fluconazole now D/C'd and started Eraxis as patient has a Fungemia -Will need reimaging of Graft as she is at high risk of seeding and likely repeat Limited ECHO -Will likely also need Opthamology Consult at some point   -May consider Palliative Consult but patient's Husband does not want to pursue that avenue yet  Hypotension -Improved with IV fluids which has made her anasarca worse. Troponin negative. Albumin extremely low. -Gave 1x dose of IV Albumin  -monitor BP  Dehydration/Poor oral intake/FTT -encourage oral intake -dietitian consulted: protein supplementation as patient has Significant Hypoalbuminemia and LE swelling  -Will add Marinol to help appetite Stimulant   Normocytic Anemia -Hb/Hct went from 8.6/26.2 -> 9.1/26.0 -> 8.9/26.6 -> 7.3/20.6 -> 9.4/28.7 -Type and Screen and  Transfused 1 unit of pRBC on 8/17 -Has some Hematuria and dark urine -repeat CBC in AM   COPD Stable. -continue nebulizers  Acute Kidney Injury -Suspected Secondary  to hypotension and Hx of Atrophic Left Kidney -Increased Diurectics to 40 mg daily and given IV Albumin today -BUN/Cr went from 16/1.12 -> 18/1.33 -> 20/1.38 -> 25/1.50 -> 27/1.40 -Continue to Monitor and Avoid Nephrotoxics -Repeat CMP in AM  Anasarca -Secondary to IV fluids. Recent echo significant for grade 1 diastolic dysfunction. Repeat echocardiogram significant for an EF of 65-70% with grade 1 diastolic dysfunction and trace mitral regurg and tricuspid regurg -Lasix was increased to 40 mg daily; Given IV Albumin today  -Albumin was <1.0 -CT scan showed Small amount of abdominal and pelvic ascites. Diffuse body wall edema consistent with anasarca  Oliguria -Improvement with diuresis -strict in/out -Patient +3.453 Liters since Admit  Yeast UTI ->100,000 CFU's -Treat With IV Fluconazole but now changed to Eraxis now that patient has a Fungemia  Hematuria -Tea colored urine. Decreased urine output initially which is now improved. Liver unremarkable for acute process. Vascular surgery without recommendations for thrombosis on consultation. -Monitor and repeated U/A today  -CT Scan showed Negative for nephrolithiasis, hydronephrosis or ureteral stone -May need to discuss with Urology  Hyponatremia -Mild at 130. Likely secondary to third spacing. -Continue to Monitor   Elevated alkaline phosphatase S/p cholecystecomy. GGT elevated. Alkaline phosphatase improving. RUQ unremarkable except for fatty liver  Initial Abnormal CT angiogram of chest/abdomen/pelvis -Thrombus of left renal artery. Vascular surgery consulted with no recommendations for acute management. -Repeat CT Scan showed Displaced wall calcification in the infrarenal abdominal aorta consistent with known thrombosed dissection.  Thrombocytopenia -Presumed secondary to acute illness.  -Has remained low and stable fluctuating between 60,000 and 80,000 and had improved to 115,000 but now trending down from  103,000 -> 57,000 -> 64,000.  -Dr. Lonny Prude had discussed with hematologist, Dr. Julien Nordmann who recommends continued surveillance.  -Can follow-up as an outpatient if not improved by discharge. -Pathology smear review showed Leukocytosis with predominance of neutrophils with toxic features. Rare circulating myelocyte.   GERD Stable. -continue PPI  Depression/Anxiety -Discontinued Klonipin -Consulted Psychiatry Dr. Louretta Shorten and appreciated Assistance -Psychiatry changed Xanax 0.25 mg BID and discontinued Celexa; Patient was started on Cymbalta 40 mg po Daily for Depression   [place] type B aortic dissection -Patient is s/p stent graft placement on previous admission. -CT showed stable non contrasted appearance of the stent graft at the aortic arch and proximal descending thoracic aorta -Continue metoprolol at reduce dose  Tachycardia -Rebound secondary to abrupt cessation of beta-blocker. -continue metoprolol at reduced dose of 12.24m BID  Leukocytosis -Patient without new source of infection but will reculture. Afebrile.  -WBC went from 26.9 -> 19.9 -> 14.3 -> 14.8 -Inital Blood cultures negative and Repeat showed 1/2 Yeast -Pro calcitonin 0.24 on admission. -Repeat Imaging showed Bilateral pleural effusions, small on the right and moderate on the left. Worsening consolidation in the lower lobes may reflect atelectasis or pneumonia. -Patient Currently on Abx as above  Hypokalemia -K+ was 3.0 -Replete with Potassium Chloride 40 mEQ BID -Continue to Monitor and Replete as Necessary -Repeat CMP in AM  Pleural Effusion on Left -U/S Guided Thoracentesis Ordered -Resulted 0.65 mL of Fluid Removal -Repeat CXR this AM showed No change from previous day's study. 2. Persistent left lung base opacity consistent with combination atelectasis and a probable small effusion  DVT prophylaxis: SCDs as patient's platelet count remain low Code Status: FULL CODE  Family Communication:  Discussed with Husband over the Phone Disposition Plan: Remain Inpatient and D/C to SNF  Consultants:   Vascular Surgery  Case was discussed with Dr. Julien Nordmann   Psychiatry    Procedures:  ABI Left Thoracentesis    Antimicrobials:  Anti-infectives    Start     Dose/Rate Route Frequency Ordered Stop   09/21/16 0000  anidulafungin (ERAXIS) 50 mg in sodium chloride 0.9 % 50 mL IVPB  Status:  Discontinued     50 mg 78 mL/hr over 50 Minutes Intravenous Every 24 hours 09/19/16 0345 09/19/16 0346   09/19/16 2200  anidulafungin (ERAXIS) 100 mg in sodium chloride 0.9 % 100 mL IVPB     100 mg 78 mL/hr over 100 Minutes Intravenous Every 24 hours 09/19/16 0345     09/19/16 0400  anidulafungin (ERAXIS) 200 mg in sodium chloride 0.9 % 200 mL IVPB     200 mg 78 mL/hr over 200 Minutes Intravenous  Once 09/19/16 0345 09/19/16 0819   09/18/16 1800  fluconazole (DIFLUCAN) IVPB 100 mg     100 mg 50 mL/hr over 60 Minutes Intravenous Every 24 hours 09/18/16 1109     09/17/16 1800  ceFEPIme (MAXIPIME) 2 g in dextrose 5 % 50 mL IVPB     2 g 100 mL/hr over 30 Minutes Intravenous Every 24 hours 09/17/16 1243     09/17/16 1800  fluconazole (DIFLUCAN) IVPB 200 mg  Status:  Discontinued     200 mg 100 mL/hr over 60 Minutes Intravenous Every 24 hours 09/17/16 1651 09/18/16 1109   09/12/16 1400  ceFEPIme (MAXIPIME) 1 g in dextrose 5 % 50 mL IVPB  Status:  Discontinued     1 g 100 mL/hr over 30 Minutes Intravenous Every 8 hours 09/12/16 1127 09/17/16 1243   09/11/16 1600  metroNIDAZOLE (FLAGYL) IVPB 500 mg     500 mg 100 mL/hr over 60 Minutes Intravenous Every 8 hours 09/11/16 0902     09/11/16 1400  ceFEPIme (MAXIPIME) 1 g in dextrose 5 % 50 mL IVPB  Status:  Discontinued     1 g 100 mL/hr over 30 Minutes Intravenous Every 24 hours 09/11/16 1234 09/12/16 1127   09/10/16 2000  vancomycin (VANCOCIN) IVPB 1000 mg/200 mL premix  Status:  Discontinued     1,000 mg 200 mL/hr over 60 Minutes Intravenous  Every 24 hours 09/22/2016 2130 09/10/16 1616   09/10/16 1800  vancomycin (VANCOCIN) IVPB 750 mg/150 ml premix  Status:  Discontinued     750 mg 150 mL/hr over 60 Minutes Intravenous Every 12 hours 09/10/16 1616 09/12/16 1145   09/10/16 1400  levofloxacin (LEVAQUIN) IVPB 500 mg  Status:  Discontinued     500 mg 100 mL/hr over 60 Minutes Intravenous Every 24 hours 09/19/2016 2112 09/11/16 1220   09/10/16 0115  vancomycin (VANCOCIN) IVPB 1000 mg/200 mL premix  Status:  Discontinued     1,000 mg 200 mL/hr over 60 Minutes Intravenous  Once 09/10/16 0110 09/10/16 0118   09/21/2016 2200  metroNIDAZOLE (FLAGYL) IVPB 500 mg  Status:  Discontinued     500 mg 100 mL/hr over 60 Minutes Intravenous Every 6 hours 09/10/2016 2110 09/11/16 0902   09/28/2016 2115  vancomycin (VANCOCIN) IVPB 1000 mg/200 mL premix     1,000 mg 200 mL/hr over 60 Minutes Intravenous  Once 09/20/2016 2108 10/02/2016 2323   09/24/2016 1430  levofloxacin (LEVAQUIN) IVPB 750 mg     750 mg 100 mL/hr over 90 Minutes Intravenous  Once  09/17/2016 1415 09/13/2016 2152   09/11/2016 1430  metroNIDAZOLE (FLAGYL) IVPB 500 mg     500 mg 100 mL/hr over 60 Minutes Intravenous  Once 09/12/2016 1415 10/01/2016 1850   09/17/2016 1430  levofloxacin (LEVAQUIN) IVPB 750 mg  Status:  Discontinued     750 mg 100 mL/hr over 90 Minutes Intravenous  Once 09/05/2016 1416 10/01/2016 1416     Subjective: Seen and examined and was still weak. Stated she was weeping from all the fluid. Discouraged to find out that she has yeast in the blood. No nausea or vomiting and still does not want to get in the chair.    Objective: Vitals:   09/18/16 2154 09/19/16 0505 09/19/16 0518 09/19/16 1252  BP:  116/63  (!) 112/51  Pulse: 87 80  79  Resp: '16 10  16  ' Temp:  (!) 97.5 F (36.4 C)  97.6 F (36.4 C)  TempSrc:  Oral  Oral  SpO2: 96% 99%  98%  Weight:   78.9 kg (173 lb 15.1 oz)   Height:        Intake/Output Summary (Last 24 hours) at 09/19/16 1935 Last data filed at 09/19/16  1752  Gross per 24 hour  Intake             1110 ml  Output              525 ml  Net              585 ml   Filed Weights   09/18/16 0411 09/18/16 1756 09/19/16 0518  Weight: 85.4 kg (188 lb 4.4 oz) 79.3 kg (174 lb 14.4 oz) 78.9 kg (173 lb 15.1 oz)   Examination: Physical Exam:  Constitutional: Depressed Obese Caucasian female who appears older than her stated age laying in bed calm  Eyes: Sclerae anicteric. Conjunctivae Non-injected ENMT: External ears and nose appear normal. Grossly normal Hearing Neck: Supple with no JVD Respiratory: Diminished to auscultation. No appreciable wheezing or rales Cardiovascular: RRR; S1 S2; 3+ LE Pitting edema in the Upper and Lower Extremities Abdomen: Soft. Tender to palpate. Distended due to body habitus. Bowel Sounds present GU: Deferred Musculoskeletal: No contractures; No cyanosis Skin: Warm and weeping. Patient has some bruising in LE. No rashes or lesions Neurologic: CN 2-12 grossly intact. No appreciable focal deficits Psychiatric: Depressed mood and flat affect. Awake and alert. Intact judgement and insight  Data Reviewed: I have personally reviewed following labs and imaging studies  CBC:  Recent Labs Lab 09/15/16 0210 09/16/16 0331 09/17/16 0337 09/18/16 0255 09/19/16 0403  WBC 16.7* 26.9* 19.9* 14.3* 14.8*  NEUTROABS  --   --  17.0* 11.6* 12.6*  HGB 8.6* 9.1* 8.9* 7.3* 9.4*  HCT 26.2* 26.0* 26.6* 20.6* 28.7*  MCV 95.6 95.9 95.7 98.1 92.3  PLT 66* 115* 103* 57* 64*   Basic Metabolic Panel:  Recent Labs Lab 09/15/16 0210 09/16/16 0331 09/17/16 0337 09/18/16 0255 09/19/16 0403  NA 132* 130* 131* 130* 130*  K 3.2* 4.4 3.7 3.9 3.0*  CL 109 105 107 107 106  CO2 18* 16* 18* 17* 19*  GLUCOSE 98 107* 102* 84 103*  BUN '16 18 20 ' 25* 27*  CREATININE 1.12* 1.33* 1.38* 1.50* 1.40*  CALCIUM 6.7* 6.9* 7.0* 6.9* 7.0*  MG  --   --  1.5* 1.8 1.9  PHOS  --   --  3.8 3.8 3.5   GFR: Estimated Creatinine Clearance: 42.5 mL/min  (A) (by C-G formula based on SCr of  1.4 mg/dL (H)). Liver Function Tests:  Recent Labs Lab 09/13/16 1755 09/15/16 0834 09/17/16 0337 09/18/16 0255 09/19/16 0403  AST '25 18 18 19 19  ' ALT 13* 11* 13* 10* 10*  ALKPHOS 270* 202* 253* 249* 292*  BILITOT 0.9 0.8 0.8 0.9 0.9  PROT 4.9* 4.1* 4.5* 3.7* 4.1*  ALBUMIN 1.3* 1.1* 1.1* <1.0* 1.0*   No results for input(s): LIPASE, AMYLASE in the last 168 hours. No results for input(s): AMMONIA in the last 168 hours. Coagulation Profile: No results for input(s): INR, PROTIME in the last 168 hours. Cardiac Enzymes:  Recent Labs Lab 09/13/16 1755  CKTOTAL 13*   BNP (last 3 results) No results for input(s): PROBNP in the last 8760 hours. HbA1C: No results for input(s): HGBA1C in the last 72 hours. CBG:  Recent Labs Lab 09/18/16 1626 09/18/16 2050 09/19/16 0643 09/19/16 1142 09/19/16 1651  GLUCAP 112* 123* 106* 119* 115*   Lipid Profile: No results for input(s): CHOL, HDL, LDLCALC, TRIG, CHOLHDL, LDLDIRECT in the last 72 hours. Thyroid Function Tests: No results for input(s): TSH, T4TOTAL, FREET4, T3FREE, THYROIDAB in the last 72 hours. Anemia Panel: No results for input(s): VITAMINB12, FOLATE, FERRITIN, TIBC, IRON, RETICCTPCT in the last 72 hours. Sepsis Labs: No results for input(s): PROCALCITON, LATICACIDVEN in the last 168 hours.  Recent Results (from the past 240 hour(s))  Culture, Urine     Status: None   Collection Time: 09/28/2016  9:40 PM  Result Value Ref Range Status   Specimen Description URINE, CLEAN CATCH  Final   Special Requests NONE  Final   Culture   Final    NO GROWTH Performed at [facility], [address] [place], [place] [address]    Report Status 09/11/2016 FINAL  Final  Culture, Urine     Status: Abnormal   Collection Time: 09/16/16 10:49 AM  Result Value Ref Range Status   Specimen Description URINE, CLEAN CATCH  Final   Special Requests NONE  Final   Culture >=100,000 COLONIES/mL YEAST  (A)  Final   Report Status 09/17/2016 FINAL  Final  Culture, blood (routine x 2)     Status: None (Preliminary result)   Collection Time: 09/16/16  9:34 PM  Result Value Ref Range Status   Specimen Description BLOOD RIGHT HAND  Final   Special Requests IN PEDIATRIC BOTTLE Blood Culture adequate volume  Final   Culture NO GROWTH 3 DAYS  Final   Report Status PENDING  Incomplete  Culture, blood (routine x 2)     Status: None (Preliminary result)   Collection Time: 09/16/16  9:34 PM  Result Value Ref Range Status   Specimen Description BLOOD RIGHT HAND  Final   Special Requests IN PEDIATRIC BOTTLE Blood Culture adequate volume  Final   Culture  Setup Time   Final    YEAST IN PEDIATRIC BOTTLE CRITICAL RESULT CALLED TO, READ BACK BY AND VERIFIED WITH: G.ABBOTT, PHARMD 09/19/16 0330 L.CHAMPION    Culture YEAST CULTURE REINCUBATED FOR BETTER GROWTH   Final   Report Status PENDING  Incomplete  Gram stain     Status: None   Collection Time: 09/18/16 12:14 PM  Result Value Ref Range Status   Specimen Description PLEURAL LEFT  Final   Special Requests NONE  Final   Gram Stain   Final    MODERATE WBC PRESENT, PREDOMINANTLY PMN NO ORGANISMS SEEN    Report Status 09/18/2016 FINAL  Final  Culture, body fluid-bottle     Status:  None (Preliminary result)   Collection Time: 09/18/16 12:14 PM  Result Value Ref Range Status   Specimen Description PLEURAL LEFT  Final   Special Requests NONE  Final   Culture NO GROWTH 1 DAY  Final   Report Status PENDING  Incomplete    Radiology Studies: Ct Abdomen Pelvis Wo Contrast  Result Date: 09/18/2016 CLINICAL DATA:  Nausea and vomiting, hematuria EXAM: CT CHEST, ABDOMEN AND PELVIS WITHOUT CONTRAST TECHNIQUE: Multidetector CT imaging of the chest, abdomen and pelvis was performed following the standard protocol without IV contrast. COMPARISON:  Ultrasound 09/14/2016, CT chest 09/20/2016, CT abdomen pelvis 07/18/2016, CT abdomen pelvis 10/27/2011 FINDINGS:  CT CHEST FINDINGS Cardiovascular: Limited without contrast. Aortic stent graft re- demonstrated at the level of the aortic arch and terminating in the descending thoracic aorta for known type B dissection. Similar caliber of thoracic aorta. Aortic atherosclerotic calcification. Normal heart size. No significant pericardial effusion. Mediastinum/Nodes: Midline trachea. No thyroid mass. No significant mediastinal adenopathy. Esophagus within normal limits Lungs/Pleura: Bilateral pleural effusions, small on the right, moderate on the left, increased compared to prior chest CT. Increased consolidations within the bilateral lower lobes and lingula. No pneumothorax. Musculoskeletal: No chest wall mass or suspicious bone lesions identified. CT ABDOMEN PELVIS FINDINGS Hepatobiliary: Hepatic steatosis. Surgical clips in the gallbladder fossa. Negative for biliary dilatation Pancreas: Unremarkable. No pancreatic ductal dilatation or surrounding inflammatory changes. Spleen: Normal in size without focal abnormality. Adrenals/Urinary Tract: Right adrenal gland normal. Stable 2.2 cm left adrenal nodule. Negative for hydronephrosis. Atrophy left kidney. 1 cm cyst anterior mid left kidney. Intrarenal vascular calcification on the right. Negative for nephrolithiasis, hydronephrosis or ureteral stone. Foley catheter in the bladder with minimal air. Bladder is empty Stomach/Bowel: Stomach nonenlarged. Diffuse fluid-filled small and large bowel without obstruction or significant wall thickening. Vascular/Lymphatic: Non aneurysmal aorta. Displaced wall calcification at the infrarenal abdominal aorta, corresponding to thrombosed dissection on prior CTs. No significantly enlarged abdominal or pelvic lymph nodes. Reproductive: Status post hysterectomy. No adnexal masses. Other: Negative for free air. Development of small amount of abdominal and pelvic ascites. Diffuse body wall edema. Musculoskeletal: No acute or suspicious abnormalities.  IMPRESSION: 1. Bilateral pleural effusions, small on the right and moderate on the left. Worsening consolidation in the lower lobes may reflect atelectasis or pneumonia. 2. Stable non contrasted appearance of the stent graft at the aortic arch and proximal descending thoracic aorta. 3. Negative for nephrolithiasis, hydronephrosis or ureteral stone. 4. Hepatic steatosis. 5. 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 Displaced wall calcification in the infrarenal abdominal aorta consistent with known thrombosed dissection. 7. Small amount of abdominal and pelvic ascites. Diffuse body wall edema consistent with anasarca 8. Atrophic left kidney Electronically Signed   By: Donavan Foil M.D.   On: 09/18/2016 00:27   Dg Chest 1 View  Result Date: 09/18/2016 CLINICAL DATA:  Status post left thoracentesis today. EXAM: CHEST 1 VIEW COMPARISON:  CT chest 09/17/2016.  PA and lateral chest 09/10/2016. FINDINGS: Very small left pleural effusion is identified. No pneumothorax. Subsegmental atelectasis is present in the left lung base. The right lung is clear. Heart size is normal. Aortic stent graft noted. IMPRESSION: Minimal left pleural effusion after thoracentesis. Negative for pneumothorax. Electronically Signed   By: Inge Rise M.D.   On: 09/18/2016 12:21   Ct Chest Wo Contrast  Result Date: 09/18/2016 CLINICAL DATA:  Nausea and vomiting, hematuria EXAM: CT CHEST, ABDOMEN AND PELVIS WITHOUT CONTRAST TECHNIQUE: Multidetector CT imaging of the chest,  abdomen and pelvis was performed following the standard protocol without IV contrast. COMPARISON:  Ultrasound 09/14/2016, CT chest 09/20/2016, CT abdomen pelvis 07/18/2016, CT abdomen pelvis 10/27/2011 FINDINGS: CT CHEST FINDINGS Cardiovascular: Limited without contrast. Aortic stent graft re- demonstrated at the level of the aortic arch and terminating in the descending thoracic aorta for known type B dissection. Similar caliber of thoracic aorta. Aortic  atherosclerotic calcification. Normal heart size. No significant pericardial effusion. Mediastinum/Nodes: Midline trachea. No thyroid mass. No significant mediastinal adenopathy. Esophagus within normal limits Lungs/Pleura: Bilateral pleural effusions, small on the right, moderate on the left, increased compared to prior chest CT. Increased consolidations within the bilateral lower lobes and lingula. No pneumothorax. Musculoskeletal: No chest wall mass or suspicious bone lesions identified. CT ABDOMEN PELVIS FINDINGS Hepatobiliary: Hepatic steatosis. Surgical clips in the gallbladder fossa. Negative for biliary dilatation Pancreas: Unremarkable. No pancreatic ductal dilatation or surrounding inflammatory changes. Spleen: Normal in size without focal abnormality. Adrenals/Urinary Tract: Right adrenal gland normal. Stable 2.2 cm left adrenal nodule. Negative for hydronephrosis. Atrophy left kidney. 1 cm cyst anterior mid left kidney. Intrarenal vascular calcification on the right. Negative for nephrolithiasis, hydronephrosis or ureteral stone. Foley catheter in the bladder with minimal air. Bladder is empty Stomach/Bowel: Stomach nonenlarged. Diffuse fluid-filled small and large bowel without obstruction or significant wall thickening. Vascular/Lymphatic: Non aneurysmal aorta. Displaced wall calcification at the infrarenal abdominal aorta, corresponding to thrombosed dissection on prior CTs. No significantly enlarged abdominal or pelvic lymph nodes. Reproductive: Status post hysterectomy. No adnexal masses. Other: Negative for free air. Development of small amount of abdominal and pelvic ascites. Diffuse body wall edema. Musculoskeletal: No acute or suspicious abnormalities. IMPRESSION: 1. Bilateral pleural effusions, small on the right and moderate on the left. Worsening consolidation in the lower lobes may reflect atelectasis or pneumonia. 2. Stable non contrasted appearance of the stent graft at the aortic arch and  proximal descending thoracic aorta. 3. Negative for nephrolithiasis, hydronephrosis or ureteral stone. 4. Hepatic steatosis. 5. 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 Displaced wall calcification in the infrarenal abdominal aorta consistent with known thrombosed dissection. 7. Small amount of abdominal and pelvic ascites. Diffuse body wall edema consistent with anasarca 8. Atrophic left kidney Electronically Signed   By: Donavan Foil M.D.   On: 09/18/2016 00:27   Dg Chest Port 1 View  Result Date: 09/19/2016 CLINICAL DATA:  Followup pleural effusion. EXAM: PORTABLE CHEST 1 VIEW COMPARISON:  09/18/2016 FINDINGS: Mild left lung base opacity is consistent with a combination of atelectasis and a small effusion, unchanged from the previous day's study. Remainder of the lungs is clear. Cardiac silhouette is normal in size. Stable thoracic aortic stent. No mediastinal or hilar masses. No pneumothorax. IMPRESSION: 1. No change from previous day's study. 2. Persistent left lung base opacity consistent with combination atelectasis and a probable small effusion. Electronically Signed   By: Lajean Manes M.D.   On: 09/19/2016 08:09   Ir Thoracentesis Asp Pleural Space W/img Guide  Result Date: 09/18/2016 INDICATION: Patient with failure to thrive and recent leukocytosis with no definite source. Recent imaging revealed a left-sided pleural effusion. Request is made for diagnostic and therapeutic thoracentesis. EXAM: ULTRASOUND GUIDED DIAGNOSTIC AND THERAPEUTIC THORACENTESIS MEDICATIONS: 1% lidocaine COMPLICATIONS: None immediate. PROCEDURE: An ultrasound guided thoracentesis was thoroughly discussed with the patient and questions answered. The benefits, risks, alternatives and complications were also discussed. The patient understands and wishes to proceed with the procedure. Written consent was obtained. Ultrasound was performed to localize  and mark an adequate pocket of fluid in the left chest. The  area was then prepped and draped in the normal sterile fashion. 1% Lidocaine was used for local anesthesia. Under ultrasound guidance a Safe-T-Centesis catheter was introduced. Thoracentesis was performed. The catheter was removed and a dressing applied. FINDINGS: A total of approximately 0.65 L of clear pale yellow fluid was removed. Samples were sent to the laboratory as requested by the clinical team. IMPRESSION: Successful ultrasound guided left thoracentesis yielding 0.65 L of pleural fluid. Read by: Saverio Danker, PA-C Electronically Signed   By: Corrie Mckusick D.O.   On: 09/18/2016 12:28   Scheduled Meds: . ALPRAZolam  0.25 mg Oral BID  . ARIPiprazole  5 mg Oral Daily  . aspirin EC  81 mg Oral Daily  . dronabinol  2.5 mg Oral BID AC  . DULoxetine  40 mg Oral Daily  . feeding supplement (ENSURE ENLIVE)  237 mL Oral TID BM  . feeding supplement (PRO-STAT SUGAR FREE 64)  30 mL Oral BID  . furosemide  40 mg Oral Daily  . insulin aspart  0-5 Units Subcutaneous QHS  . insulin aspart  0-9 Units Subcutaneous TID WC  . metoprolol tartrate  12.5 mg Oral BID  . pantoprazole (PROTONIX) IV  40 mg Intravenous Q24H  . potassium chloride  40 mEq Oral BID  . sodium chloride flush  3 mL Intravenous Q12H   Continuous Infusions: . anidulafungin    . ceFEPime (MAXIPIME) IV Stopped (09/19/16 1752)  . fluconazole (DIFLUCAN) IV 100 mg (09/19/16 1905)  . metronidazole Stopped (09/19/16 1800)    LOS: 10 days   Kerney Elbe, DO [facility] Pager [phone]  If 7PM-7AM, please contact night-coverage [email or web address] Password [facility] 09/19/2016, 7:35 PM

## 2016-09-19 NOTE — Progress Notes (Signed)
PHARMACY - PHYSICIAN COMMUNICATION CRITICAL VALUE ALERT - BLOOD CULTURE IDENTIFICATION (BCID)  No results found for this or any previous visit.   1/2 Blood cultures growing yeast.  Currently receiving Fluconazole 100 mg IV daily for yeast in urine.     Name of physician (or Provider) Contacted:  X. Blount  Changes to prescribed antibiotics required:   Recommended change to Eraxis   Caryl Pina 09/19/2016  3:47 AM

## 2016-09-19 NOTE — Progress Notes (Addendum)
Patient refuses to get O.O.B. And will not try to put weigh on her feet. "I can't" patient stated

## 2016-09-19 NOTE — Progress Notes (Signed)
Tried to get patient to set in chair and she still refuses

## 2016-09-19 NOTE — Consult Note (Signed)
[name]  Date of Admission:  09/21/2016  Date of Consult:  09/19/2016  Reason for Consult: Fungemia Referring Physician: CHAMP  Impression/Recommendation Fungemia Would repeat her BCx in AM Continue eraxis She needs imaging of her graft, she is at high risk of seeding.  Check sensitivities on her yeast when it has been identified. She will most likely need to be on prolonged antifungals with her stent.   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  She is very depressed.   ARF She has had mildly worsening Cr, most likely due to lasix she is getting for anasarca.   Thank you so much for this interesting consult,   Haley Graham (pager) [phone] [email or web address]  Haley Graham is an 61 y.o. female.  HPI: 61 yo F with hx of Aortic dissection with thoracic graft/stent, hospitalized from 5-19 to 08-22-16. This was complicated by L renal infarct.  She came to the hospital on 8-8 with 1 week of decreased apetite, chills, nausea and weakness. She also had non-radiating chest pain. She was found on CT to have thrombosis of L renal artery as well as colitis/enteritis. THere was also a question of a R renal artery filing defect. She was started on levaquin, vanco, and flagyl after meeting sepsis criteria (presumed due to colitis/enteritis).   She had worsening of her leukocytosis on 8-15 and underwent repeat CT scan as well repeat Cx.  She was continued on her cefepime-flagyl.  She was found to have >100k yeast on her UCx and was started on fluconazole 8-16.  She underwent thoracentesis for L pleural effusion on 8-17 (clear fluid, 37 WBC, Prot < 3, Cx P).  Also, she has been seen by psychiatry for her unwillingness to participate in PT and claim that she has given up.   This AM her BCID was positive for  yeast on BCx 2/2. Her flucaonazole was changed to anidulafungin.  She has been afebrile, her WBC has been improving since 8-15 (peak 26.9).    Past Medical History:  Diagnosis Date  . Anxiety   . Arthritis   . Diabetes mellitus without complication ([place])   . Gastroparesis   . GERD (gastroesophageal reflux disease)   . Hypertension   . IBS (irritable bowel syndrome)     Past Surgical History:  Procedure Laterality Date  . ABDOMINAL HYSTERECTOMY    . AORTOGRAM N/A 07/24/2016   Procedure: [facility] AND ABDOMINAL AORTA  ANGIOGRAM;  Surgeon: Waynetta Sandy, MD;  Location: [facility];  Service: Vascular;  Laterality: N/A;  . CESAREAN SECTION    . CHOLECYSTECTOMY    . COLONOSCOPY    . COLONOSCOPY N/A 06/10/2016   Procedure: COLONOSCOPY;  Surgeon: Rogene Houston, MD;  [facility];  Service: Endoscopy;  Laterality: N/A;  8:30  . IR THORACENTESIS ASP PLEURAL SPACE W/IMG GUIDE  09/18/2016  . POLYPECTOMY  06/10/2016   Procedure: POLYPECTOMY;  Surgeon: Rogene Houston, MD;  Location: [facility];  Service: Endoscopy;;  multiple colon  . THORACIC AORTIC ENDOVASCULAR STENT GRAFT N/A 08/06/2016   Procedure: THORACIC AORTIC ENDOVASCULAR STENT GRAFT/Thorasic and Abdominal Angiogram, Entravascular ultrasound., Open femoral exposure,Left Brachial access, and patch angioplasty right femoral artery.;  Surgeon: Serafina Mitchell, MD;  Location: [facility] OR;  Service: Vascular;  Laterality: N/A;  . UPPER GASTROINTESTINAL ENDOSCOPY       Allergies  Allergen Reactions  . Penicillins Rash and Other (See Comments)    PATIENT HAS HAD A PCN REACTION WITH IMMEDIATE RASH, FACIAL/TONGUE/THROAT SWELLING, SOB, OR LIGHTHEADEDNESS WITH HYPOTENSION:  #  #  #  YES  #  #  #   Has patient had a PCN reaction causing severe rash involving mucus membranes or skin necrosis: no Has patient had a PCN reaction that required hospitalization no Has patient had a PCN reaction occurring within the last 10 years:no If all of  the above answers are "NO", then may proceed with Cephalosporin use.  . Ativan [Lorazepam] Other (See Comments)    Makes pt very confused and more agitated, irritable.  . Codeine Nausea And Vomiting  . Reglan [Metoclopramide] Other (See Comments)    TACHYCARDIA    Medications:  Scheduled: . ALPRAZolam  0.25 mg Oral BID  . ARIPiprazole  5 mg Oral Daily  . aspirin EC  81 mg Oral Daily  . dronabinol  2.5 mg Oral BID AC  . DULoxetine  40 mg Oral Daily  . feeding supplement (ENSURE ENLIVE)  237 mL Oral TID BM  . feeding supplement (PRO-STAT SUGAR FREE 64)  30 mL Oral BID  . insulin aspart  0-5 Units Subcutaneous QHS  . insulin aspart  0-9 Units Subcutaneous TID WC  . metoprolol tartrate  12.5 mg Oral BID  . pantoprazole (PROTONIX) IV  40 mg Intravenous Q24H  . potassium chloride  40 mEq Oral BID  . sodium chloride flush  3 mL Intravenous Q12H    Abtx:  Anti-infectives    Start     Dose/Rate Route Frequency Ordered Stop   09/21/16 0000  anidulafungin (ERAXIS) 50 mg in sodium chloride 0.9 % 50 mL IVPB  Status:  Discontinued     50 mg 78 mL/hr over 50 Minutes Intravenous Every 24 hours 09/19/16 0345 09/19/16 0346   09/19/16 2200  anidulafungin (ERAXIS) 100 mg in sodium chloride 0.9 % 100 mL IVPB     100 mg 78 mL/hr over 100 Minutes Intravenous Every 24 hours 09/19/16 0345     09/19/16 0400  anidulafungin (ERAXIS) 200 mg in sodium chloride 0.9 % 200 mL IVPB     200 mg 78 mL/hr over 200 Minutes Intravenous  Once 09/19/16 0345 09/19/16 0819   09/18/16 1800  fluconazole (DIFLUCAN) IVPB 100 mg     100 mg 50 mL/hr over 60 Minutes Intravenous Every 24 hours 09/18/16 1109     09/17/16 1800  ceFEPIme (MAXIPIME) 2 g in dextrose 5 % 50 mL IVPB     2 g 100 mL/hr over 30 Minutes Intravenous Every 24 hours 09/17/16 1243     09/17/16 1800  fluconazole (DIFLUCAN) IVPB 200 mg  Status:  Discontinued     200 mg 100 mL/hr over 60 Minutes Intravenous Every 24 hours 09/17/16 1651 09/18/16 1109    09/12/16 1400  ceFEPIme (MAXIPIME) 1 g in dextrose 5 % 50 mL IVPB  Status:  Discontinued     1 g 100 mL/hr over 30 Minutes Intravenous Every 8 hours 09/12/16 1127 09/17/16 1243   09/11/16 1600  metroNIDAZOLE (FLAGYL) IVPB 500 mg     500 mg 100 mL/hr over 60 Minutes Intravenous Every 8 hours 09/11/16 0902     09/11/16 1400  ceFEPIme (MAXIPIME) 1 g in dextrose 5 % 50 mL IVPB  Status:  Discontinued     1 g 100 mL/hr over 30 Minutes Intravenous Every 24 hours 09/11/16 1234 09/12/16 1127   09/10/16  2000  vancomycin (VANCOCIN) IVPB 1000 mg/200 mL premix  Status:  Discontinued     1,000 mg 200 mL/hr over 60 Minutes Intravenous Every 24 hours 09/17/2016 2130 09/10/16 1616   09/10/16 1800  vancomycin (VANCOCIN) IVPB 750 mg/150 ml premix  Status:  Discontinued     750 mg 150 mL/hr over 60 Minutes Intravenous Every 12 hours 09/10/16 1616 09/12/16 1145   09/10/16 1400  levofloxacin (LEVAQUIN) IVPB 500 mg  Status:  Discontinued     500 mg 100 mL/hr over 60 Minutes Intravenous Every 24 hours 09/28/2016 2112 09/11/16 1220   09/10/16 0115  vancomycin (VANCOCIN) IVPB 1000 mg/200 mL premix  Status:  Discontinued     1,000 mg 200 mL/hr over 60 Minutes Intravenous  Once 09/10/16 0110 09/10/16 0118   09/27/2016 2200  metroNIDAZOLE (FLAGYL) IVPB 500 mg  Status:  Discontinued     500 mg 100 mL/hr over 60 Minutes Intravenous Every 6 hours 09/27/2016 2110 09/11/16 0902   09/28/2016 2115  vancomycin (VANCOCIN) IVPB 1000 mg/200 mL premix     1,000 mg 200 mL/hr over 60 Minutes Intravenous  Once 09/05/2016 2108 09/05/2016 2323   09/12/2016 1430  levofloxacin (LEVAQUIN) IVPB 750 mg     750 mg 100 mL/hr over 90 Minutes Intravenous  Once 09/25/2016 1415 09/29/2016 2152   09/18/2016 1430  metroNIDAZOLE (FLAGYL) IVPB 500 mg     500 mg 100 mL/hr over 60 Minutes Intravenous  Once 09/15/2016 1415 09/16/2016 1850   09/28/2016 1430  levofloxacin (LEVAQUIN) IVPB 750 mg  Status:  Discontinued     750 mg 100 mL/hr over 90 Minutes Intravenous  Once  09/02/2016 1416 09/18/2016 1416      Total days of antibiotics: 10 Cefepime/flagyl Day 0 anidlufungin          Social History:  reports that she has been smoking Cigarettes.  She has a 17.25 pack-year smoking history. She has never used smokeless tobacco. She reports that she does not drink alcohol or use drugs.  Family History  Problem Relation Age of Onset  . Cancer Mother   . Stroke Mother   . Heart attack Mother   . Stroke Father     History obtained from chart review and the patient General ROS: eating sparsely, n/v today, no f/c, will not get out of bed to urinate (to get foley out), no change in vision, see HPI.   Blood pressure 116/63, pulse 80, temperature (!) 97.5 F (36.4 C), temperature source Oral, resp. rate 10, height _0  (1.6 m), weight 78.9 kg (173 lb 15.1 oz), SpO2 99 %. General appearance: alert, cooperative and no distress Eyes: negative findings: conjunctivae and sclerae normal and pupils equal, round, reactive to light and accomodation Throat: normal findings: oropharynx pink & moist without lesions or evidence of thrush Neck: no adenopathy and supple, symmetrical, trachea midline Lungs: clear to auscultation bilaterally Heart: regular rate and rhythm Abdomen: normal findings: bowel sounds normal and soft, non-tender Extremities: edema moderate LE and clean peripheral IV on R biceps. no PIC.  Skin: multiple echymoses, small scab on L knee.   Results for orders placed or performed during the hospital encounter of 09/08/2016 (from the past 48 hour(s))  Glucose, capillary     Status: Abnormal   Collection Time: 09/17/16  4:32 PM  Result Value Ref Range   Glucose-Capillary 128 (H) 65 - 99 mg/dL   Comment 1 Notify RN    Comment 2 Document in Chart   Glucose, capillary  Status: Abnormal   Collection Time: 09/17/16  9:05 PM  Result Value Ref Range   Glucose-Capillary 101 (H) 65 - 99 mg/dL  CBC with Differential/Platelet     Status: Abnormal   Collection  Time: 09/18/16  2:55 AM  Result Value Ref Range   WBC 14.3 (H) 4.0 - 10.5 K/uL   RBC 2.10 (L) 3.87 - 5.11 MIL/uL   Hemoglobin 7.3 (L) 12.0 - 15.0 g/dL   HCT 20.6 (L) 36.0 - 46.0 %   MCV 98.1 78.0 - 100.0 fL   MCH 34.8 (H) 26.0 - 34.0 pg   MCHC 35.4 30.0 - 36.0 g/dL   RDW 20.9 (H) 11.5 - 15.5 %   Platelets 57 (L) 150 - 400 K/uL    Comment: REPEATED TO VERIFY PLATELET COUNT CONFIRMED BY SMEAR    Neutrophils Relative % 81 %   Lymphocytes Relative 15 %   Monocytes Relative 4 %   Eosinophils Relative 0 %   Basophils Relative 0 %   Neutro Abs 11.6 (H) 1.7 - 7.7 K/uL   Lymphs Abs 2.1 0.7 - 4.0 K/uL   Monocytes Absolute 0.6 0.1 - 1.0 K/uL   Eosinophils Absolute 0.0 0.0 - 0.7 K/uL   Basophils Absolute 0.0 0.0 - 0.1 K/uL   RBC Morphology TARGET CELLS    WBC Morphology MILD LEFT SHIFT (1-5% METAS, OCC MYELO, OCC BANDS)     Comment: TOXIC GRANULATION  Comprehensive metabolic panel     Status: Abnormal   Collection Time: 09/18/16  2:55 AM  Result Value Ref Range   Sodium 130 (L) 135 - 145 mmol/L   Potassium 3.9 3.5 - 5.1 mmol/L   Chloride 107 101 - 111 mmol/L   CO2 17 (L) 22 - 32 mmol/L   Glucose, Bld 84 65 - 99 mg/dL   BUN 25 (H) 6 - 20 mg/dL   Creatinine, Ser 1.50 (H) 0.44 - 1.00 mg/dL   Calcium 6.9 (L) 8.9 - 10.3 mg/dL   Total Protein 3.7 (L) 6.5 - 8.1 g/dL   Albumin <1.0 (L) 3.5 - 5.0 g/dL   AST 19 15 - 41 U/L   ALT 10 (L) 14 - 54 U/L   Alkaline Phosphatase 249 (H) 38 - 126 U/L   Total Bilirubin 0.9 0.3 - 1.2 mg/dL   GFR calc non Af Amer 37 (L) >60 mL/min   GFR calc Af Amer 43 (L) >60 mL/min    Comment: (NOTE) The eGFR has been calculated using the CKD EPI equation. This calculation has not been validated in all clinical situations. eGFR's persistently <60 mL/min signify possible Chronic Kidney Disease.    Anion gap 6 5 - 15  Magnesium     Status: None   Collection Time: 09/18/16  2:55 AM  Result Value Ref Range   Magnesium 1.8 1.7 - 2.4 mg/dL  Phosphorus     Status:  None   Collection Time: 09/18/16  2:55 AM  Result Value Ref Range   Phosphorus 3.8 2.5 - 4.6 mg/dL  Glucose, capillary     Status: None   Collection Time: 09/18/16  5:58 AM  Result Value Ref Range   Glucose-Capillary 97 65 - 99 mg/dL  Type and screen [place]     Status: None   Collection Time: 09/18/16 10:17 AM  Result Value Ref Range   ABO/RH(D) O POS    Antibody Screen NEG    Sample Expiration 09/21/2016    Unit Number [id number]    Blood Component  Type RED CELLS,LR    Unit division 00    Status of Unit ISSUED,FINAL    Transfusion Status OK TO TRANSFUSE    Crossmatch Result COMPATIBLE   Lactate dehydrogenase (pleural or peritoneal fluid)     Status: Abnormal   Collection Time: 09/18/16 12:14 PM  Result Value Ref Range   LD, Fluid 51 (H) 3 - 23 U/L    Comment: (NOTE) Results should be evaluated in conjunction with serum values    Fluid Type-FLDH Pleural, L   Body fluid cell count with differential     Status: Abnormal   Collection Time: 09/18/16 12:14 PM  Result Value Ref Range   Fluid Type-FCT Pleural, L    Color, Fluid YELLOW (A) YELLOW   Appearance, Fluid CLEAR CLEAR   WBC, Fluid 37 0 - 1,000 cu mm   Neutrophil Count, Fluid 57 (H) 0 - 25 %   Lymphs, Fluid 7 %   Monocyte-Macrophage-Serous Fluid 36 (L) 50 - 90 %   Eos, Fluid 0 %  Gram stain     Status: None   Collection Time: 09/18/16 12:14 PM  Result Value Ref Range   Specimen Description PLEURAL LEFT    Special Requests NONE    Gram Stain      MODERATE WBC PRESENT, PREDOMINANTLY PMN NO ORGANISMS SEEN    Report Status 09/18/2016 FINAL   Amylase, pleural or peritoneal fluid     Status: None   Collection Time: 09/18/16 12:14 PM  Result Value Ref Range   Amylase, Fluid 26 U/L    Comment: NO NORMAL RANGE ESTABLISHED FOR THIS TEST   Fluid Type-FAMY Pleural, L   Albumin, pleural or peritoneal fluid     Status: None   Collection Time: 09/18/16 12:14 PM  Result Value Ref Range    Albumin, Fluid <1.0 g/dL    Comment: REPEATED TO VERIFY (NOTE) No normal range established for this test Results should be evaluated in conjunction with serum values    Fluid Type-FALB Pleural, L   Protein, pleural or peritoneal fluid     Status: None   Collection Time: 09/18/16 12:14 PM  Result Value Ref Range   Total protein, fluid <3.0 g/dL    Comment: REPEATED TO VERIFY (NOTE) No normal range established for this test Results should be evaluated in conjunction with serum values    Fluid Type-FTP Pleural, L   Glucose, pleural or peritoneal fluid     Status: None   Collection Time: 09/18/16 12:14 PM  Result Value Ref Range   Glucose, Fluid 97 mg/dL    Comment: (NOTE) No normal range established for this test Results should be evaluated in conjunction with serum values    Fluid Type-FGLU Pleural, L   Glucose, capillary     Status: Abnormal   Collection Time: 09/18/16  4:26 PM  Result Value Ref Range   Glucose-Capillary 112 (H) 65 - 99 mg/dL   Comment 1 Notify RN    Comment 2 Document in Chart   Glucose, capillary     Status: Abnormal   Collection Time: 09/18/16  8:50 PM  Result Value Ref Range   Glucose-Capillary 123 (H) 65 - 99 mg/dL   Comment 1 Notify RN   CBC with Differential/Platelet     Status: Abnormal   Collection Time: 09/19/16  4:03 AM  Result Value Ref Range   WBC 14.8 (H) 4.0 - 10.5 K/uL   RBC 3.11 (L) 3.87 - 5.11 MIL/uL   Hemoglobin 9.4 (L) 12.0 -  15.0 g/dL    Comment: DELTA CHECK NOTED REPEATED TO VERIFY POST TRANSFUSION SPECIMEN    HCT 28.7 (L) 36.0 - 46.0 %   MCV 92.3 78.0 - 100.0 fL   MCH 30.9 26.0 - 34.0 pg   MCHC 33.4 30.0 - 36.0 g/dL   RDW 19.1 (H) 11.5 - 15.5 %   Platelets 64 (L) 150 - 400 K/uL    Comment: CONSISTENT WITH PREVIOUS RESULT   Neutrophils Relative % 85 %   Neutro Abs 12.6 (H) 1.7 - 7.7 K/uL   Lymphocytes Relative 11 %   Lymphs Abs 1.6 0.7 - 4.0 K/uL   Monocytes Relative 4 %   Monocytes Absolute 0.6 0.1 - 1.0 K/uL    Eosinophils Relative 0 %   Eosinophils Absolute 0.0 0.0 - 0.7 K/uL   Basophils Relative 0 %   Basophils Absolute 0.0 0.0 - 0.1 K/uL  Comprehensive metabolic panel     Status: Abnormal   Collection Time: 09/19/16  4:03 AM  Result Value Ref Range   Sodium 130 (L) 135 - 145 mmol/L   Potassium 3.0 (L) 3.5 - 5.1 mmol/L    Comment: DELTA CHECK NOTED   Chloride 106 101 - 111 mmol/L   CO2 19 (L) 22 - 32 mmol/L   Glucose, Bld 103 (H) 65 - 99 mg/dL   BUN 27 (H) 6 - 20 mg/dL   Creatinine, Ser 1.40 (H) 0.44 - 1.00 mg/dL   Calcium 7.0 (L) 8.9 - 10.3 mg/dL   Total Protein 4.1 (L) 6.5 - 8.1 g/dL   Albumin 1.0 (L) 3.5 - 5.0 g/dL   AST 19 15 - 41 U/L   ALT 10 (L) 14 - 54 U/L   Alkaline Phosphatase 292 (H) 38 - 126 U/L   Total Bilirubin 0.9 0.3 - 1.2 mg/dL   GFR calc non Af Amer 40 (L) >60 mL/min   GFR calc Af Amer 46 (L) >60 mL/min    Comment: (NOTE) The eGFR has been calculated using the CKD EPI equation. This calculation has not been validated in all clinical situations. eGFR's persistently <60 mL/min signify possible Chronic Kidney Disease.    Anion gap 5 5 - 15  Magnesium     Status: None   Collection Time: 09/19/16  4:03 AM  Result Value Ref Range   Magnesium 1.9 1.7 - 2.4 mg/dL  Phosphorus     Status: None   Collection Time: 09/19/16  4:03 AM  Result Value Ref Range   Phosphorus 3.5 2.5 - 4.6 mg/dL  Glucose, capillary     Status: Abnormal   Collection Time: 09/19/16  6:43 AM  Result Value Ref Range   Glucose-Capillary 106 (H) 65 - 99 mg/dL  Glucose, capillary     Status: Abnormal   Collection Time: 09/19/16 11:42 AM  Result Value Ref Range   Glucose-Capillary 119 (H) 65 - 99 mg/dL   Comment 1 Notify RN    Comment 2 Document in Chart       Component Value Date/Time   SDES PLEURAL LEFT 09/18/2016 1214   SPECREQUEST NONE 09/18/2016 1214   CULT NO GROWTH 2 DAYS 09/16/2016 2134   CULT YEAST CULTURE REINCUBATED FOR BETTER GROWTH  09/16/2016 2134   REPTSTATUS 09/18/2016 FINAL  09/18/2016 1214   Ct Abdomen Pelvis Wo Contrast  Result Date: 09/18/2016 CLINICAL DATA:  Nausea and vomiting, hematuria EXAM: CT CHEST, ABDOMEN AND PELVIS WITHOUT CONTRAST TECHNIQUE: Multidetector CT imaging of the chest, abdomen and pelvis was performed following the standard  protocol without IV contrast. COMPARISON:  Ultrasound 09/14/2016, CT chest 09/08/2016, CT abdomen pelvis 07/18/2016, CT abdomen pelvis 10/27/2011 FINDINGS: CT CHEST FINDINGS Cardiovascular: Limited without contrast. Aortic stent graft re- demonstrated at the level of the aortic arch and terminating in the descending thoracic aorta for known type B dissection. Similar caliber of thoracic aorta. Aortic atherosclerotic calcification. Normal heart size. No significant pericardial effusion. Mediastinum/Nodes: Midline trachea. No thyroid mass. No significant mediastinal adenopathy. Esophagus within normal limits Lungs/Pleura: Bilateral pleural effusions, small on the right, moderate on the left, increased compared to prior chest CT. Increased consolidations within the bilateral lower lobes and lingula. No pneumothorax. Musculoskeletal: No chest wall mass or suspicious bone lesions identified. CT ABDOMEN PELVIS FINDINGS Hepatobiliary: Hepatic steatosis. Surgical clips in the gallbladder fossa. Negative for biliary dilatation Pancreas: Unremarkable. No pancreatic ductal dilatation or surrounding inflammatory changes. Spleen: Normal in size without focal abnormality. Adrenals/Urinary Tract: Right adrenal gland normal. Stable 2.2 cm left adrenal nodule. Negative for hydronephrosis. Atrophy left kidney. 1 cm cyst anterior mid left kidney. Intrarenal vascular calcification on the right. Negative for nephrolithiasis, hydronephrosis or ureteral stone. Foley catheter in the bladder with minimal air. Bladder is empty Stomach/Bowel: Stomach nonenlarged. Diffuse fluid-filled small and large bowel without obstruction or significant wall thickening.  Vascular/Lymphatic: Non aneurysmal aorta. Displaced wall calcification at the infrarenal abdominal aorta, corresponding to thrombosed dissection on prior CTs. No significantly enlarged abdominal or pelvic lymph nodes. Reproductive: Status post hysterectomy. No adnexal masses. Other: Negative for free air. Development of small amount of abdominal and pelvic ascites. Diffuse body wall edema. Musculoskeletal: No acute or suspicious abnormalities. IMPRESSION: 1. Bilateral pleural effusions, small on the right and moderate on the left. Worsening consolidation in the lower lobes may reflect atelectasis or pneumonia. 2. Stable non contrasted appearance of the stent graft at the aortic arch and proximal descending thoracic aorta. 3. Negative for nephrolithiasis, hydronephrosis or ureteral stone. 4. Hepatic steatosis. 5. 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 Displaced wall calcification in the infrarenal abdominal aorta consistent with known thrombosed dissection. 7. Small amount of abdominal and pelvic ascites. Diffuse body wall edema consistent with anasarca 8. Atrophic left kidney Electronically Signed   By: Donavan Foil M.D.   On: 09/18/2016 00:27   Dg Chest 1 View  Result Date: 09/18/2016 CLINICAL DATA:  Status post left thoracentesis today. EXAM: CHEST 1 VIEW COMPARISON:  CT chest 09/17/2016.  PA and lateral chest 09/15/2016. FINDINGS: Very small left pleural effusion is identified. No pneumothorax. Subsegmental atelectasis is present in the left lung base. The right lung is clear. Heart size is normal. Aortic stent graft noted. IMPRESSION: Minimal left pleural effusion after thoracentesis. Negative for pneumothorax. Electronically Signed   By: Inge Rise M.D.   On: 09/18/2016 12:21   Ct Chest Wo Contrast  Result Date: 09/18/2016 CLINICAL DATA:  Nausea and vomiting, hematuria EXAM: CT CHEST, ABDOMEN AND PELVIS WITHOUT CONTRAST TECHNIQUE: Multidetector CT imaging of the chest, abdomen and  pelvis was performed following the standard protocol without IV contrast. COMPARISON:  Ultrasound 09/14/2016, CT chest 09/08/2016, CT abdomen pelvis 07/18/2016, CT abdomen pelvis 10/27/2011 FINDINGS: CT CHEST FINDINGS Cardiovascular: Limited without contrast. Aortic stent graft re- demonstrated at the level of the aortic arch and terminating in the descending thoracic aorta for known type B dissection. Similar caliber of thoracic aorta. Aortic atherosclerotic calcification. Normal heart size. No significant pericardial effusion. Mediastinum/Nodes: Midline trachea. No thyroid mass. No significant mediastinal adenopathy. Esophagus within normal limits Lungs/Pleura: Bilateral pleural  effusions, small on the right, moderate on the left, increased compared to prior chest CT. Increased consolidations within the bilateral lower lobes and lingula. No pneumothorax. Musculoskeletal: No chest wall mass or suspicious bone lesions identified. CT ABDOMEN PELVIS FINDINGS Hepatobiliary: Hepatic steatosis. Surgical clips in the gallbladder fossa. Negative for biliary dilatation Pancreas: Unremarkable. No pancreatic ductal dilatation or surrounding inflammatory changes. Spleen: Normal in size without focal abnormality. Adrenals/Urinary Tract: Right adrenal gland normal. Stable 2.2 cm left adrenal nodule. Negative for hydronephrosis. Atrophy left kidney. 1 cm cyst anterior mid left kidney. Intrarenal vascular calcification on the right. Negative for nephrolithiasis, hydronephrosis or ureteral stone. Foley catheter in the bladder with minimal air. Bladder is empty Stomach/Bowel: Stomach nonenlarged. Diffuse fluid-filled small and large bowel without obstruction or significant wall thickening. Vascular/Lymphatic: Non aneurysmal aorta. Displaced wall calcification at the infrarenal abdominal aorta, corresponding to thrombosed dissection on prior CTs. No significantly enlarged abdominal or pelvic lymph nodes. Reproductive: Status post  hysterectomy. No adnexal masses. Other: Negative for free air. Development of small amount of abdominal and pelvic ascites. Diffuse body wall edema. Musculoskeletal: No acute or suspicious abnormalities. IMPRESSION: 1. Bilateral pleural effusions, small on the right and moderate on the left. Worsening consolidation in the lower lobes may reflect atelectasis or pneumonia. 2. Stable non contrasted appearance of the stent graft at the aortic arch and proximal descending thoracic aorta. 3. Negative for nephrolithiasis, hydronephrosis or ureteral stone. 4. Hepatic steatosis. 5. 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 Displaced wall calcification in the infrarenal abdominal aorta consistent with known thrombosed dissection. 7. Small amount of abdominal and pelvic ascites. Diffuse body wall edema consistent with anasarca 8. Atrophic left kidney Electronically Signed   By: Donavan Foil M.D.   On: 09/18/2016 00:27   Dg Chest Port 1 View  Result Date: 09/19/2016 CLINICAL DATA:  Followup pleural effusion. EXAM: PORTABLE CHEST 1 VIEW COMPARISON:  09/18/2016 FINDINGS: Mild left lung base opacity is consistent with a combination of atelectasis and a small effusion, unchanged from the previous day's study. Remainder of the lungs is clear. Cardiac silhouette is normal in size. Stable thoracic aortic stent. No mediastinal or hilar masses. No pneumothorax. IMPRESSION: 1. No change from previous day's study. 2. Persistent left lung base opacity consistent with combination atelectasis and a probable small effusion. Electronically Signed   By: Lajean Manes M.D.   On: 09/19/2016 08:09   Ir Thoracentesis Asp Pleural Space W/img Guide  Result Date: 09/18/2016 INDICATION: Patient with failure to thrive and recent leukocytosis with no definite source. Recent imaging revealed a left-sided pleural effusion. Request is made for diagnostic and therapeutic thoracentesis. EXAM: ULTRASOUND GUIDED DIAGNOSTIC AND THERAPEUTIC  THORACENTESIS MEDICATIONS: 1% lidocaine COMPLICATIONS: None immediate. PROCEDURE: An ultrasound guided thoracentesis was thoroughly discussed with the patient and questions answered. The benefits, risks, alternatives and complications were also discussed. The patient understands and wishes to proceed with the procedure. Written consent was obtained. Ultrasound was performed to localize and mark an adequate pocket of fluid in the left chest. The area was then prepped and draped in the normal sterile fashion. 1% Lidocaine was used for local anesthesia. Under ultrasound guidance a Safe-T-Centesis catheter was introduced. Thoracentesis was performed. The catheter was removed and a dressing applied. FINDINGS: A total of approximately 0.65 L of clear pale yellow fluid was removed. Samples were sent to the laboratory as requested by the clinical team. IMPRESSION: Successful ultrasound guided left thoracentesis yielding 0.65 L of pleural fluid. Read by: Saverio Danker,  PA-C Electronically Signed   By: Corrie Mckusick D.O.   On: 09/18/2016 12:28   Recent Results (from the past 240 hour(s))  Blood Culture (routine x 2)     Status: None   Collection Time: 09/03/2016  2:09 PM  Result Value Ref Range Status   Specimen Description BLOOD RIGHT ANTECUBITAL  Final   Special Requests   Final    BOTTLES DRAWN AEROBIC AND ANAEROBIC Blood Culture adequate volume   Culture   Final    NO GROWTH 5 DAYS Performed at [facility], [address] [place], [place] [address]    Report Status 09/14/2016 FINAL  Final  Culture, Urine     Status: None   Collection Time: 09/15/2016  9:40 PM  Result Value Ref Range Status   Specimen Description URINE, CLEAN CATCH  Final   Special Requests NONE  Final   Culture   Final    NO GROWTH Performed at [facility], [place] [address] [place], [place] [address]    Report Status 09/11/2016 FINAL  Final  Culture, Urine     Status: Abnormal   Collection Time: 09/16/16 10:49 AM    Result Value Ref Range Status   Specimen Description URINE, CLEAN CATCH  Final   Special Requests NONE  Final   Culture >=100,000 COLONIES/mL YEAST (A)  Final   Report Status 09/17/2016 FINAL  Final  Culture, blood (routine x 2)     Status: None (Preliminary result)   Collection Time: 09/16/16  9:34 PM  Result Value Ref Range Status   Specimen Description BLOOD RIGHT HAND  Final   Special Requests IN PEDIATRIC BOTTLE Blood Culture adequate volume  Final   Culture NO GROWTH 2 DAYS  Final   Report Status PENDING  Incomplete  Culture, blood (routine x 2)     Status: None (Preliminary result)   Collection Time: 09/16/16  9:34 PM  Result Value Ref Range Status   Specimen Description BLOOD RIGHT HAND  Final   Special Requests IN PEDIATRIC BOTTLE Blood Culture adequate volume  Final   Culture  Setup Time   Final    YEAST IN PEDIATRIC BOTTLE CRITICAL RESULT CALLED TO, READ BACK BY AND VERIFIED WITH: G.ABBOTT, PHARMD 09/19/16 0330 L.CHAMPION    Culture YEAST CULTURE REINCUBATED FOR BETTER GROWTH   Final   Report Status PENDING  Incomplete  Gram stain     Status: None   Collection Time: 09/18/16 12:14 PM  Result Value Ref Range Status   Specimen Description PLEURAL LEFT  Final   Special Requests NONE  Final   Gram Stain   Final    MODERATE WBC PRESENT, PREDOMINANTLY PMN NO ORGANISMS SEEN    Report Status 09/18/2016 FINAL  Final      09/19/2016, 11:57 AM     LOS: 10 days    Records and images were personally reviewed where available.  Haley Rumpf, MD [facility] [phone] 09/19/2016, 11:57 AM

## 2016-09-20 DIAGNOSIS — B377 Candidal sepsis: Principal | ICD-10-CM

## 2016-09-20 LAB — COMPREHENSIVE METABOLIC PANEL
ALBUMIN: 1.2 g/dL — AB (ref 3.5–5.0)
ALK PHOS: 345 U/L — AB (ref 38–126)
ALT: 10 U/L — AB (ref 14–54)
ANION GAP: 7 (ref 5–15)
AST: 24 U/L (ref 15–41)
BILIRUBIN TOTAL: 0.9 mg/dL (ref 0.3–1.2)
BUN: 32 mg/dL — ABNORMAL HIGH (ref 6–20)
CALCIUM: 7.2 mg/dL — AB (ref 8.9–10.3)
CO2: 15 mmol/L — ABNORMAL LOW (ref 22–32)
CREATININE: 1.37 mg/dL — AB (ref 0.44–1.00)
Chloride: 111 mmol/L (ref 101–111)
GFR calc non Af Amer: 41 mL/min — ABNORMAL LOW (ref >60)
GFR, EST AFRICAN AMERICAN: 48 mL/min — AB (ref >60)
GLUCOSE: 105 mg/dL — AB (ref 65–99)
Potassium: 3.9 mmol/L (ref 3.5–5.1)
Sodium: 133 mmol/L — ABNORMAL LOW (ref 135–145)
TOTAL PROTEIN: 3.9 g/dL — AB (ref 6.5–8.1)

## 2016-09-20 LAB — CULTURE, BLOOD (ROUTINE X 2): Special Requests: ADEQUATE

## 2016-09-20 LAB — CBC
HEMATOCRIT: 26.5 % — AB (ref 36.0–46.0)
HEMOGLOBIN: 8.9 g/dL — AB (ref 12.0–15.0)
MCH: 31.1 pg (ref 26.0–34.0)
MCHC: 33.6 g/dL (ref 30.0–36.0)
MCV: 92.7 fL (ref 78.0–100.0)
Platelets: 72 10*3/uL — ABNORMAL LOW (ref 150–400)
RBC: 2.86 MIL/uL — ABNORMAL LOW (ref 3.87–5.11)
RDW: 19.7 % — AB (ref 11.5–15.5)
WBC: 9.5 10*3/uL (ref 4.0–10.5)

## 2016-09-20 LAB — GLUCOSE, CAPILLARY
GLUCOSE-CAPILLARY: 117 mg/dL — AB (ref 65–99)
Glucose-Capillary: 106 mg/dL — ABNORMAL HIGH (ref 65–99)
Glucose-Capillary: 98 mg/dL (ref 65–99)
Glucose-Capillary: 115 mg/dL — ABNORMAL HIGH (ref 65–99)

## 2016-09-20 LAB — MAGNESIUM: Magnesium: 1.8 mg/dL (ref 1.7–2.4)

## 2016-09-20 LAB — PHOSPHORUS: Phosphorus: 2.6 mg/dL (ref 2.5–4.6)

## 2016-09-20 MED ORDER — FLUCONAZOLE IN SODIUM CHLORIDE 400-0.9 MG/200ML-% IV SOLN: 400.0000 mg | INTRAVENOUS | Status: DC

## 2016-09-20 MED ORDER — FLUCONAZOLE IN SODIUM CHLORIDE 400-0.9 MG/200ML-% IV SOLN
800.0000 mg | Freq: Once | INTRAVENOUS | Status: DC
  Filled 2016-09-20: qty 400

## 2016-09-20 MED ORDER — FLUCONAZOLE IN SODIUM CHLORIDE 400-0.9 MG/200ML-% IV SOLN
400.0000 mg | INTRAVENOUS | Status: DC
  Administered 2016-09-20 – 2016-09-23 (×4): 400 mg via INTRAVENOUS
  Filled 2016-09-20 (×5): qty 200

## 2016-09-20 MED ORDER — DRONABINOL 2.5 MG PO CAPS
5.0000 mg | ORAL_CAPSULE | Freq: Two times a day (BID) | ORAL | Status: DC
  Administered 2016-09-21 – 2016-10-21 (×39): 5 mg via ORAL
  Filled 2016-09-20 (×41): qty 2

## 2016-09-20 NOTE — Progress Notes (Signed)
PROGRESS NOTE    Haley Graham  PPI:[id number] DOB: 22-Jun-1955 DOA: 09/15/2016 PCP: Glenda Chroman, MD   Brief Narrative: Haley Graham is a 61 y.o. 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 She presented with weakness and met sepsis criteria with concern for enteritis. Antibiotics started.   Patient has worsening Cr and Leukocytosis worsened and now improved. Complaining of Nausea and Abdominal Pain so will re-culture and repeat Imaging. Appears depressed and does not want to eat and just wants to lay in bed so will get Psych evaluation and place patient on Marinol. Discussed with Husband about Palliative Care Consult and he declined at this time. Patient refusing care and does not want to participate. Psych Evaluated and changed medications around. Patient also was found to have a Left Pleural Effusion and underwent Thoracentesis and also was transfused 1 unit of pRBCs on 09/18/16.   This AM she was found to have a Fungemia with 1/2 Blood Cx growing Yeast so Fluconazole was D/C'd and patient changed to Eraxis. Infectious Diseases was consulted and recommended getting repeat Imaging of Graft as she was high risk of Seeding. Sensitivities of Yeast revealed Candidemia so Eraxis was changed back to Fluconazole.  Patient this AM wanted to be left alone and refused blood draws and her repeat Blood Cx. She states she is tired and just wants to rest. Discussed with her about Palliative Care and she was agreeable to talk with them.   Assessment & Plan:   Principal Problem:   Sepsis ([place]) Active Problems:   GERD (gastroesophageal reflux disease)   Dissection of  thoracoabdominal aorta (HCC)   AAA (abdominal aortic aneurysm) (HCC)   Benign essential HTN   Diabetes mellitus type 2 in obese (HCC)   Anxiety state   Hyponatremia   Leukocytosis   Hypotension   AKI (acute kidney injury) ([place])   Dehydration   Weakness   Anemia   Thrombocytopenia (HCC)   Abnormal computed tomography angiography (CTA) of abdomen and pelvis   Colitis: Probable   Protein-calorie malnutrition, severe  Sepsis -Suspected Secondary to colitis/enteritis but patient found to have a Bacteremia with Yeast and grew out Candida Albicans -WBC worsening and went from 16.7 -> 26.9 -> 19.9 -> 14.3 -> 14.8 -Physiology improved with IV fluids and antibiotics. Has some tachycardia.  -GI pathogen panel and C. Diff negative.  -Initial Urine/blood culture negative. Repeat U/A shows evidence of UTI with Many bacteria, Large Hb, Trace Leukocytes, Positive Nitrites, TNTC RBC, TNTC WBC -Continue Flagyl and Cefepime for now and per ID Recommendations continue for 14 days total  -Repeat CT Chest/Abd/Pelvis w/o Contrast done and showed Pleural Effusion -Continue to Monitor  -Yeast grew un UTI so will treat with Fluconazole; Fluconazole now D/C'd and started Eraxis as patient has a Fungemia; Eraxis now changed back to Fluconazole because of Blood Cx revealing Candida Albicans -Will need reimaging of Graft as she is at high risk of seeding and likely repeat Limited ECHO -Will likely also need Opthamology Consult at some point   -May consider Palliative Consult but patient's Husband does not want to pursue that avenue yet  Candidemia -As above -Repeat Blood Cx grew 1/2 Yeast and was Candida Albicans; Wanted to repeat Blood Cx this AM but patient refused so it was  done later this afternoon -Will need reimaging of Graft as she is at high risk of seeding and likely repeat Limited ECHO -Infectious Disease following and appreciated Recc's  Hypotension -Improved with IV fluids which has made her  anasarca worse. Troponin negative. Albumin extremely low. -Gave 1x dose of IV Albumin on 09/19/16 -monitor BP  Dehydration/Poor oral intake/FTT -encourage oral intake -dietitian consulted: protein supplementation as patient has Significant Hypoalbuminemia and LE swelling  -Will add Marinol to help appetite Stimulant; Increased to 5 mg po BID   Normocytic Anemia -Hb/Hct went from 8.6/26.2 -> 9.1/26.0 -> 8.9/26.6 -> 7.3/20.6 -> 9.4/28.7 -Type and Screen and Transfused 1 unit of pRBC on 8/17 -Has some Hematuria and dark urine -repeat CBC not done as patient refused this AM because initial phlebotomist could not get blood  COPD Stable. -continue nebulizers  Acute Kidney Injury -Suspected Secondary to hypotension and Hx of Atrophic Left Kidney -Increased Diurectics to 40 mg daily and given IV Albumin today -BUN/Cr went from 16/1.12 -> 18/1.33 -> 20/1.38 -> 25/1.50 -> 27/1.40 -Continue to Monitor and Avoid Nephrotoxics -Repeat CMP in AM as blood was not drawn today   Anasarca -Secondary to IV fluids. Recent echo significant for grade 1 diastolic dysfunction. Repeat echocardiogram significant for an EF of 65-70% with grade 1 diastolic dysfunction and trace mitral regurg and tricuspid regurg -Lasix was increased to 40 mg daily; Given IV Albumin today  -Albumin was <1.0 -CT scan showed Small amount of abdominal and pelvic ascites. Diffuse body wall edema consistent with anasarca  Oliguria -Improvement with diuresis -strict in/out -Patient +3.853 Liters since Admit  Yeast UTI ->100,000 CFU's -Treated With IV Fluconazole but now changed to Eraxis now that patient has a Fungemia; Changed back to IV Fluconazole now that Cx grew out Candida Albicans   Hematuria -Tea colored urine. Decreased urine output initially which is now improved. Liver unremarkable for acute process. Vascular surgery without recommendations for thrombosis on consultation. -Monitor and repeated U/A today    -CT Scan showed Negative for nephrolithiasis, hydronephrosis or ureteral stone -May need to discuss with Urology -Foley Catheter discontinued   Hyponatremia -Mild at 130. Likely secondary to third spacing. -Continue to Monitor   Elevated alkaline phosphatase S/p cholecystecomy. GGT elevated. Alkaline phosphatase improving. RUQ unremarkable except for fatty liver  Initial Abnormal CT angiogram of chest/abdomen/pelvis -Thrombus of left renal artery. Vascular surgery consulted with no recommendations for acute management. -Repeat CT Scan showed Displaced wall calcification in the infrarenal abdominal aorta consistent with known thrombosed dissection.  Thrombocytopenia -Presumed secondary to acute illness.  -Has remained low and stable fluctuating between 60,000 and 80,000 and had improved to 115,000 but now trending down from 103,000 -> 57,000 -> 64,000.  -Dr. Lonny Prude had discussed with hematologist, Dr. Julien Nordmann who recommends continued surveillance.  -Can follow-up as an outpatient if not improved by discharge. -Pathology smear review showed Leukocytosis with predominance of neutrophils with toxic features. Rare circulating myelocyte.  -Repat CBC in AM   GERD Stable. -continue PPI  Depression/Anxiety -Discontinued Klonipin -Consulted Psychiatry Dr. Louretta Shorten and appreciated Assistance -Psychiatry changed Xanax 0.25 mg BID and discontinued Celexa; Patient was started on Cymbalta 40 mg po Daily for Depression   [place] type B aortic dissection -Patient is s/p stent graft placement on previous admission. -CT showed stable non contrasted appearance of the stent graft at the aortic arch and proximal descending thoracic aorta -Continue metoprolol at reduce dose  Tachycardia -Rebound secondary to abrupt cessation of beta-blocker. -continue metoprolol at reduced dose of  12.61m BID  Leukocytosis -Patient without new source of infection but will reculture. Afebrile.   -WBC went from 26.9 -> 19.9 -> 14.3 -> 14.8 -Inital Blood cultures negative and Repeat showed 1/2 Yeast -Pro calcitonin 0.24 on admission. -Repeat Imaging showed Bilateral pleural effusions, small on the right and moderate on the left. Worsening consolidation in the lower lobes may reflect atelectasis or pneumonia. -Patient Currently on Abx as above  Hypokalemia -K+ was 3.0; No repeat Lab as patient refused Lab draw this AM -Replete with Potassium Chloride 40 mEQ BID yesterday -Continue to Monitor and Replete as Necessary -Repeat CMP in AM if patient does not refuse  Pleural Effusion on Left -U/S Guided Thoracentesis Ordered 09/19/17 -Resulted 0.65 mL of Fluid Removal -Repeat CXR this AM showed No change from previous day's study. 2. Persistent left lung base opacity consistent with combination atelectasis and a probable small effusion  DVT prophylaxis: SCDs as patient's platelet count remain low Code Status: FULL CODE Family Communication: Discussed with Husband and Daughter at bedside  Disposition Plan: Remain Inpatient and D/C to SNF  Consultants:   Vascular Surgery  Case was discussed with Dr. MJulien Nordmann  Psychiatry    Procedures:  ABI Left Thoracentesis    Antimicrobials:  Anti-infectives    Start     Dose/Rate Route Frequency Ordered Stop   09/21/16 0000  anidulafungin (ERAXIS) 50 mg in sodium chloride 0.9 % 50 mL IVPB  Status:  Discontinued     50 mg 78 mL/hr over 50 Minutes Intravenous Every 24 hours 09/19/16 0345 09/19/16 0346   09/19/16 2200  anidulafungin (ERAXIS) 100 mg in sodium chloride 0.9 % 100 mL IVPB     100 mg 78 mL/hr over 100 Minutes Intravenous Every 24 hours 09/19/16 0345     09/19/16 0400  anidulafungin (ERAXIS) 200 mg in sodium chloride 0.9 % 200 mL IVPB     200 mg 78 mL/hr over 200 Minutes Intravenous  Once 09/19/16 0345 09/19/16 0819   09/18/16 1800  fluconazole (DIFLUCAN) IVPB 100 mg  Status:  Discontinued     100 mg 50 mL/hr over 60  Minutes Intravenous Every 24 hours 09/18/16 1109 09/19/16 1937   09/17/16 1800  ceFEPIme (MAXIPIME) 2 g in dextrose 5 % 50 mL IVPB     2 g 100 mL/hr over 30 Minutes Intravenous Every 24 hours 09/17/16 1243     09/17/16 1800  fluconazole (DIFLUCAN) IVPB 200 mg  Status:  Discontinued     200 mg 100 mL/hr over 60 Minutes Intravenous Every 24 hours 09/17/16 1651 09/18/16 1109   09/12/16 1400  ceFEPIme (MAXIPIME) 1 g in dextrose 5 % 50 mL IVPB  Status:  Discontinued     1 g 100 mL/hr over 30 Minutes Intravenous Every 8 hours 09/12/16 1127 09/17/16 1243   09/11/16 1600  metroNIDAZOLE (FLAGYL) IVPB 500 mg     500 mg 100 mL/hr over 60 Minutes Intravenous Every 8 hours 09/11/16 0902     09/11/16 1400  ceFEPIme (MAXIPIME) 1 g in dextrose 5 % 50 mL IVPB  Status:  Discontinued     1 g 100 mL/hr over 30 Minutes Intravenous Every 24 hours 09/11/16 1234 09/12/16 1127   09/10/16 2000  vancomycin (VANCOCIN) IVPB 1000 mg/200 mL premix  Status:  Discontinued     1,000 mg 200 mL/hr over 60 Minutes Intravenous Every 24 hours 09/02/2016 2130 09/10/16 1616   09/10/16 1800  vancomycin (VANCOCIN) IVPB 750 mg/150 ml premix  Status:  Discontinued  750 mg 150 mL/hr over 60 Minutes Intravenous Every 12 hours 09/10/16 1616 09/12/16 1145   09/10/16 1400  levofloxacin (LEVAQUIN) IVPB 500 mg  Status:  Discontinued     500 mg 100 mL/hr over 60 Minutes Intravenous Every 24 hours 09/26/2016 2112 09/11/16 1220   09/10/16 0115  vancomycin (VANCOCIN) IVPB 1000 mg/200 mL premix  Status:  Discontinued     1,000 mg 200 mL/hr over 60 Minutes Intravenous  Once 09/10/16 0110 09/10/16 0118   09/08/2016 2200  metroNIDAZOLE (FLAGYL) IVPB 500 mg  Status:  Discontinued     500 mg 100 mL/hr over 60 Minutes Intravenous Every 6 hours 09/26/2016 2110 09/11/16 0902   09/19/2016 2115  vancomycin (VANCOCIN) IVPB 1000 mg/200 mL premix     1,000 mg 200 mL/hr over 60 Minutes Intravenous  Once 09/18/2016 2108 09/25/2016 2323   09/05/2016 1430   levofloxacin (LEVAQUIN) IVPB 750 mg     750 mg 100 mL/hr over 90 Minutes Intravenous  Once 09/29/2016 1415 09/26/2016 2152   09/28/2016 1430  metroNIDAZOLE (FLAGYL) IVPB 500 mg     500 mg 100 mL/hr over 60 Minutes Intravenous  Once 09/19/2016 1415 09/10/2016 1850   09/26/2016 1430  levofloxacin (LEVAQUIN) IVPB 750 mg  Status:  Discontinued     750 mg 100 mL/hr over 90 Minutes Intravenous  Once 09/04/2016 1416 09/23/2016 1416     Subjective: Seen and examined at bedside and was tired and wanted to be left alone. No nausea and states she did not want any blood draws. Agreeable to Palliative Care Consult. No Nausea or vomiting.   Objective: Vitals:   09/19/16 0518 09/19/16 1252 09/19/16 2112 09/20/16 0449  BP:  (!) 112/51 133/68   Pulse:  79 85   Resp:  16    Temp:  97.6 F (36.4 C) 97.8 F (36.6 C) 98.2 F (36.8 C)  TempSrc:  Oral Oral Oral  SpO2:  98% 97%   Weight: 78.9 kg (173 lb 15.1 oz)   80.4 kg (177 lb 4 oz)  Height:        Intake/Output Summary (Last 24 hours) at 09/20/16 0754 Last data filed at 09/19/16 1752  Gross per 24 hour  Intake              510 ml  Output              125 ml  Net              385 ml   Filed Weights   09/18/16 1756 09/19/16 0518 09/20/16 0449  Weight: 79.3 kg (174 lb 14.4 oz) 78.9 kg (173 lb 15.1 oz) 80.4 kg (177 lb 4 oz)   Examination: Physical Exam:  Constitutional: Severely depressed Caucasian obese female laying in bed and does not want to be bothered.  Eyes: Sclerae anicteric; Conjunctivae non-injected ENMT: Grossly normal hearing. External ears and nose appear normal Neck: Supple with no JVD Respiratory: Diminished; No appreciable wheezing/rales/rhonchi Cardiovascular: RRR; 3+ Upper and Lower extremity edema weeping Abdomen: Soft, Tender to palpate. Distended due to body habitus.  GU: Has foley catheter in place Musculoskeletal: No contractures. No cyanosis Skin: Weeping from Anasarca. Has bilateral lower extremity ecchymosis. Neurologic: CN  2-12 grossly intact. No appreciable focal deficits Psychiatric: Severely depressed mood and affect. Has no motivation. Has intact judgement and insight and awake and alert and oriented.   Data Reviewed: I have personally reviewed following labs and imaging studies  CBC:  Recent Labs Lab 09/15/16 0210 09/16/16  0131 09/17/16 0337 09/18/16 0255 09/19/16 0403  WBC 16.7* 26.9* 19.9* 14.3* 14.8*  NEUTROABS  --   --  17.0* 11.6* 12.6*  HGB 8.6* 9.1* 8.9* 7.3* 9.4*  HCT 26.2* 26.0* 26.6* 20.6* 28.7*  MCV 95.6 95.9 95.7 98.1 92.3  PLT 66* 115* 103* 57* 64*   Basic Metabolic Panel:  Recent Labs Lab 09/15/16 0210 09/16/16 0331 09/17/16 0337 09/18/16 0255 09/19/16 0403  NA 132* 130* 131* 130* 130*  K 3.2* 4.4 3.7 3.9 3.0*  CL 109 105 107 107 106  CO2 18* 16* 18* 17* 19*  GLUCOSE 98 107* 102* 84 103*  BUN _0 25* 27*  CREATININE 1.12* 1.33* 1.38* 1.50* 1.40*  CALCIUM 6.7* 6.9* 7.0* 6.9* 7.0*  MG  --   --  1.5* 1.8 1.9  PHOS  --   --  3.8 3.8 3.5   GFR: Estimated Creatinine Clearance: 42.9 mL/min (A) (by C-G formula based on SCr of 1.4 mg/dL (H)). Liver Function Tests:  Recent Labs Lab 09/13/16 1755 09/15/16 0834 09/17/16 0337 09/18/16 0255 09/19/16 0403  AST _1 ALT 13* 11* 13* 10* 10*  ALKPHOS 270* 202* 253* 249* 292*  BILITOT 0.9 0.8 0.8 0.9 0.9  PROT 4.9* 4.1* 4.5* 3.7* 4.1*  ALBUMIN 1.3* 1.1* 1.1* <1.0* 1.0*   No results for input(s): LIPASE, AMYLASE in the last 168 hours. No results for input(s): AMMONIA in the last 168 hours. Coagulation Profile: No results for input(s): INR, PROTIME in the last 168 hours. Cardiac Enzymes:  Recent Labs Lab 09/13/16 1755  CKTOTAL 13*   BNP (last 3 results) No results for input(s): PROBNP in the last 8760 hours. HbA1C: No results for input(s): HGBA1C in the last 72 hours. CBG:  Recent Labs Lab 09/19/16 0643 09/19/16 1142 09/19/16 1651 09/19/16 2109 09/20/16 0646  GLUCAP 106* 119* 115* 114*  106*   Lipid Profile: No results for input(s): CHOL, HDL, LDLCALC, TRIG, CHOLHDL, LDLDIRECT in the last 72 hours. Thyroid Function Tests: No results for input(s): TSH, T4TOTAL, FREET4, T3FREE, THYROIDAB in the last 72 hours. Anemia Panel: No results for input(s): VITAMINB12, FOLATE, FERRITIN, TIBC, IRON, RETICCTPCT in the last 72 hours. Sepsis Labs: No results for input(s): PROCALCITON, LATICACIDVEN in the last 168 hours.  Recent Results (from the past 240 hour(s))  Culture, Urine     Status: Abnormal   Collection Time: 09/16/16 10:49 AM  Result Value Ref Range Status   Specimen Description URINE, CLEAN CATCH  Final   Special Requests NONE  Final   Culture >=100,000 COLONIES/mL YEAST (A)  Final   Report Status 09/17/2016 FINAL  Final  Culture, blood (routine x 2)     Status: None (Preliminary result)   Collection Time: 09/16/16  9:34 PM  Result Value Ref Range Status   Specimen Description BLOOD RIGHT HAND  Final   Special Requests IN PEDIATRIC BOTTLE Blood Culture adequate volume  Final   Culture NO GROWTH 3 DAYS  Final   Report Status PENDING  Incomplete  Culture, blood (routine x 2)     Status: None (Preliminary result)   Collection Time: 09/16/16  9:34 PM  Result Value Ref Range Status   Specimen Description BLOOD RIGHT HAND  Final   Special Requests IN PEDIATRIC BOTTLE Blood Culture adequate volume  Final   Culture  Setup Time   Final    YEAST IN PEDIATRIC BOTTLE CRITICAL RESULT CALLED TO, READ BACK BY AND VERIFIED WITH: G.ABBOTT, PHARMD 09/19/16 0330  L.CHAMPION    Culture YEAST CULTURE REINCUBATED FOR BETTER GROWTH   Final   Report Status PENDING  Incomplete  Gram stain     Status: None   Collection Time: 09/18/16 12:14 PM  Result Value Ref Range Status   Specimen Description PLEURAL LEFT  Final   Special Requests NONE  Final   Gram Stain   Final    MODERATE WBC PRESENT, PREDOMINANTLY PMN NO ORGANISMS SEEN    Report Status 09/18/2016 FINAL  Final  Culture, body  fluid-bottle     Status: None (Preliminary result)   Collection Time: 09/18/16 12:14 PM  Result Value Ref Range Status   Specimen Description PLEURAL LEFT  Final   Special Requests NONE  Final   Culture NO GROWTH 1 DAY  Final   Report Status PENDING  Incomplete    Radiology Studies: Dg Chest 1 View  Result Date: 09/18/2016 CLINICAL DATA:  Status post left thoracentesis today. EXAM: CHEST 1 VIEW COMPARISON:  CT chest 09/17/2016.  PA and lateral chest 09/04/2016. FINDINGS: Very small left pleural effusion is identified. No pneumothorax. Subsegmental atelectasis is present in the left lung base. The right lung is clear. Heart size is normal. Aortic stent graft noted. IMPRESSION: Minimal left pleural effusion after thoracentesis. Negative for pneumothorax. Electronically Signed   By: Inge Rise M.D.   On: 09/18/2016 12:21   Dg Chest Port 1 View  Result Date: 09/19/2016 CLINICAL DATA:  Followup pleural effusion. EXAM: PORTABLE CHEST 1 VIEW COMPARISON:  09/18/2016 FINDINGS: Mild left lung base opacity is consistent with a combination of atelectasis and a small effusion, unchanged from the previous day's study. Remainder of the lungs is clear. Cardiac silhouette is normal in size. Stable thoracic aortic stent. No mediastinal or hilar masses. No pneumothorax. IMPRESSION: 1. No change from previous day's study. 2. Persistent left lung base opacity consistent with combination atelectasis and a probable small effusion. Electronically Signed   By: Lajean Manes M.D.   On: 09/19/2016 08:09   Ir Thoracentesis Asp Pleural Space W/img Guide  Result Date: 09/18/2016 INDICATION: Patient with failure to thrive and recent leukocytosis with no definite source. Recent imaging revealed a left-sided pleural effusion. Request is made for diagnostic and therapeutic thoracentesis. EXAM: ULTRASOUND GUIDED DIAGNOSTIC AND THERAPEUTIC THORACENTESIS MEDICATIONS: 1% lidocaine COMPLICATIONS: None immediate. PROCEDURE: An  ultrasound guided thoracentesis was thoroughly discussed with the patient and questions answered. The benefits, risks, alternatives and complications were also discussed. The patient understands and wishes to proceed with the procedure. Written consent was obtained. Ultrasound was performed to localize and mark an adequate pocket of fluid in the left chest. The area was then prepped and draped in the normal sterile fashion. 1% Lidocaine was used for local anesthesia. Under ultrasound guidance a Safe-T-Centesis catheter was introduced. Thoracentesis was performed. The catheter was removed and a dressing applied. FINDINGS: A total of approximately 0.65 L of clear pale yellow fluid was removed. Samples were sent to the laboratory as requested by the clinical team. IMPRESSION: Successful ultrasound guided left thoracentesis yielding 0.65 L of pleural fluid. Read by: Saverio Danker, PA-C Electronically Signed   By: Corrie Mckusick D.O.   On: 09/18/2016 12:28   Scheduled Meds: . ALPRAZolam  0.25 mg Oral BID  . ARIPiprazole  5 mg Oral Daily  . aspirin EC  81 mg Oral Daily  . dronabinol  2.5 mg Oral BID AC  . DULoxetine  40 mg Oral Daily  . feeding supplement (ENSURE ENLIVE)  237 mL Oral  TID BM  . feeding supplement (PRO-STAT SUGAR FREE 64)  30 mL Oral BID  . furosemide  40 mg Oral Daily  . insulin aspart  0-5 Units Subcutaneous QHS  . insulin aspart  0-9 Units Subcutaneous TID WC  . metoprolol tartrate  12.5 mg Oral BID  . pantoprazole (PROTONIX) IV  40 mg Intravenous Q24H  . potassium chloride  40 mEq Oral BID  . sodium chloride flush  3 mL Intravenous Q12H   Continuous Infusions: . anidulafungin Stopped (09/20/16 0022)  . ceFEPime (MAXIPIME) IV Stopped (09/19/16 1752)  . metronidazole 500 mg (09/20/16 0523)    LOS: 11 days   Kerney Elbe, DO [facility] Pager [phone]  If 7PM-7AM, please contact night-coverage [email or web address] Password [facility] 09/20/2016, 7:54 AM

## 2016-09-20 NOTE — Progress Notes (Addendum)
Pharmacy Antibiotic Note  Haley Graham is a 61 y.o. female admitted on day # 12 antibiotics for sepsis due to colitis/enteritis.Marland Kitchen  Pharmacy has been consulted for Cefepime and Metronidazole dosing. ID has recommended 14 days of total therapy.   Afebrile, WBC up yesterday, trending down again. Creatinine has been stable for several days 1.3-1.5.    8/8 Blood and urine cultures and GI panel negative.  New blood cultures 8/15 candida albicans.  Yeast in urine culture 8/15.  Plan: Change Cefepime to 2gm IV q24hrs (through 8/21) Continue Metronidazole 500 mg IV q8hrs (through 8/21) Fluconazole 400mg  IV q24h Follow renal function, clinical status Follow for length of therapy, possible de-escalation   Height: 5\' 3"  (160 cm) Weight: 177 lb 4 oz (80.4 kg) IBW/kg (Calculated) : 52.4  Temp (24hrs), Avg:97.9 F (36.6 C), Min:97.6 F (36.4 C), Max:98.2 F (36.8 C)   Recent Labs Lab 09/15/16 0210 09/16/16 0331 09/17/16 0337 09/18/16 0255 09/19/16 0403  WBC 16.7* 26.9* 19.9* 14.3* 14.8*  CREATININE 1.12* 1.33* 1.38* 1.50* 1.40*    Estimated Creatinine Clearance: 42.9 mL/min (A) (by C-G formula based on SCr of 1.4 mg/dL (H)).    Allergies  Allergen Reactions  . Penicillins Rash and Other (See Comments)    PATIENT HAS HAD A PCN REACTION WITH IMMEDIATE RASH, FACIAL/TONGUE/THROAT SWELLING, SOB, OR LIGHTHEADEDNESS WITH HYPOTENSION:  #  #  #  YES  #  #  #   Has patient had a PCN reaction causing severe rash involving mucus membranes or skin necrosis: no Has patient had a PCN reaction that required hospitalization no Has patient had a PCN reaction occurring within the last 10 years:no If all of the above answers are "NO", then may proceed with Cephalosporin use.  . Ativan [Lorazepam] Other (See Comments)    Makes pt very confused and more agitated, irritable.  . Codeine Nausea And Vomiting  . Reglan [Metoclopramide] Other (See Comments)    TACHYCARDIA   Antimicrobials this  admission: Vancomycin 8/8 >>8/11 Levaquin 8/8 >> 8/10 Flagyl 8/8>> Cefepime 8/10 >> Fluconazole 8/17>>8/18>>8/19 Anidulafungin 8/18>>8/19  Dose adjustments this admission:  8/11:  Cefepime changed from 1 gm IV q24h to 1 gm IV q8hrs   8/16:  Cefepime adjusted to 2 gm IV q24hrs for increased serum creatinine  Microbiology results: 8/8 blood x 2: negative 8/8 urine: negative 8/8 GI panel: negative 8/8 Cdiff PCR: negative 8/15 urine - >100K/ml yeast 8/15 blood x 2 - candida albicans 8/17 fluid:   Thank you for allowing pharmacy to be a part of this patient's care.  Georga Bora, PharmD Clinical Pharmacist 09/20/2016 12:14 PM

## 2016-09-20 NOTE — Progress Notes (Addendum)
INFECTIOUS DISEASE PROGRESS NOTE  ID: MATHILDE MCWHERTER is a 61 y.o. female with  Principal Problem:   Sepsis ([place]) Active Problems:   GERD (gastroesophageal reflux disease)   Dissection of thoracoabdominal aorta (HCC)   AAA (abdominal aortic aneurysm) (HCC)   Benign essential HTN   Diabetes mellitus type 2 in obese (HCC)   Anxiety state   Hyponatremia   Leukocytosis   Hypotension   AKI (acute kidney injury) ([place])   Dehydration   Weakness   Anemia   Thrombocytopenia (HCC)   Abnormal computed tomography angiography (CTA) of abdomen and pelvis   Colitis: Probable   Protein-calorie malnutrition, severe  Subjective: Wants to be left alone today.   Abtx:  Anti-infectives    Start     Dose/Rate Route Frequency Ordered Stop   09/21/16 0000  anidulafungin (ERAXIS) 50 mg in sodium chloride 0.9 % 50 mL IVPB  Status:  Discontinued     50 mg 78 mL/hr over 50 Minutes Intravenous Every 24 hours 09/19/16 0345 09/19/16 0346   09/19/16 2200  anidulafungin (ERAXIS) 100 mg in sodium chloride 0.9 % 100 mL IVPB     100 mg 78 mL/hr over 100 Minutes Intravenous Every 24 hours 09/19/16 0345     09/19/16 0400  anidulafungin (ERAXIS) 200 mg in sodium chloride 0.9 % 200 mL IVPB     200 mg 78 mL/hr over 200 Minutes Intravenous  Once 09/19/16 0345 09/19/16 0819   09/18/16 1800  fluconazole (DIFLUCAN) IVPB 100 mg  Status:  Discontinued     100 mg 50 mL/hr over 60 Minutes Intravenous Every 24 hours 09/18/16 1109 09/19/16 1937   09/17/16 1800  ceFEPIme (MAXIPIME) 2 g in dextrose 5 % 50 mL IVPB     2 g 100 mL/hr over 30 Minutes Intravenous Every 24 hours 09/17/16 1243     09/17/16 1800  fluconazole (DIFLUCAN) IVPB 200 mg  Status:  Discontinued     200 mg 100 mL/hr over 60 Minutes Intravenous Every 24 hours 09/17/16 1651 09/18/16 1109   09/12/16 1400  ceFEPIme (MAXIPIME) 1 g in dextrose 5 % 50 mL IVPB  Status:  Discontinued     1 g 100 mL/hr over 30 Minutes Intravenous Every 8 hours 09/12/16 1127  09/17/16 1243   09/11/16 1600  metroNIDAZOLE (FLAGYL) IVPB 500 mg     500 mg 100 mL/hr over 60 Minutes Intravenous Every 8 hours 09/11/16 0902     09/11/16 1400  ceFEPIme (MAXIPIME) 1 g in dextrose 5 % 50 mL IVPB  Status:  Discontinued     1 g 100 mL/hr over 30 Minutes Intravenous Every 24 hours 09/11/16 1234 09/12/16 1127   09/10/16 2000  vancomycin (VANCOCIN) IVPB 1000 mg/200 mL premix  Status:  Discontinued     1,000 mg 200 mL/hr over 60 Minutes Intravenous Every 24 hours 09/06/2016 2130 09/10/16 1616   09/10/16 1800  vancomycin (VANCOCIN) IVPB 750 mg/150 ml premix  Status:  Discontinued     750 mg 150 mL/hr over 60 Minutes Intravenous Every 12 hours 09/10/16 1616 09/12/16 1145   09/10/16 1400  levofloxacin (LEVAQUIN) IVPB 500 mg  Status:  Discontinued     500 mg 100 mL/hr over 60 Minutes Intravenous Every 24 hours 09/05/2016 2112 09/11/16 1220   09/10/16 0115  vancomycin (VANCOCIN) IVPB 1000 mg/200 mL premix  Status:  Discontinued     1,000 mg 200 mL/hr over 60 Minutes Intravenous  Once 09/10/16 0110 09/10/16 0118   10/01/2016 2200  metroNIDAZOLE (FLAGYL) IVPB 500 mg  Status:  Discontinued     500 mg 100 mL/hr over 60 Minutes Intravenous Every 6 hours 09/13/2016 2110 09/11/16 0902   09/22/2016 2115  vancomycin (VANCOCIN) IVPB 1000 mg/200 mL premix     1,000 mg 200 mL/hr over 60 Minutes Intravenous  Once 09/27/2016 2108 09/19/2016 2323   09/11/2016 1430  levofloxacin (LEVAQUIN) IVPB 750 mg     750 mg 100 mL/hr over 90 Minutes Intravenous  Once 09/25/2016 1415 09/07/2016 2152   09/18/2016 1430  metroNIDAZOLE (FLAGYL) IVPB 500 mg     500 mg 100 mL/hr over 60 Minutes Intravenous  Once 09/23/2016 1415 09/29/2016 1850   09/24/2016 1430  levofloxacin (LEVAQUIN) IVPB 750 mg  Status:  Discontinued     750 mg 100 mL/hr over 90 Minutes Intravenous  Once 09/28/2016 1416 09/22/2016 1416      Medications:  Scheduled: . ALPRAZolam  0.25 mg Oral BID  . ARIPiprazole  5 mg Oral Daily  . aspirin EC  81 mg Oral Daily  .  dronabinol  2.5 mg Oral BID AC  . DULoxetine  40 mg Oral Daily  . feeding supplement (ENSURE ENLIVE)  237 mL Oral TID BM  . feeding supplement (PRO-STAT SUGAR FREE 64)  30 mL Oral BID  . furosemide  40 mg Oral Daily  . insulin aspart  0-5 Units Subcutaneous QHS  . insulin aspart  0-9 Units Subcutaneous TID WC  . metoprolol tartrate  12.5 mg Oral BID  . pantoprazole (PROTONIX) IV  40 mg Intravenous Q24H  . potassium chloride  40 mEq Oral BID  . sodium chloride flush  3 mL Intravenous Q12H    Objective: Vital signs in last 24 hours: Temp:  [97.6 F (36.4 C)-98.2 F (36.8 C)] 98.2 F (36.8 C) (08/19 0449) Pulse Rate:  [79-85] 80 (08/19 1111) Resp:  [16] 16 (08/18 1252) BP: (112-138)/(51-79) 138/79 (08/19 1111) SpO2:  [97 %-98 %] 97 % (08/18 2112) Weight:  [80.4 kg (177 lb 4 oz)] 80.4 kg (177 lb 4 oz) (08/19 0449)   General appearance: alert, fatigued, no distress and depressed.  Resp: clear to auscultation bilaterally Cardio: regular rate and rhythm GI: normal findings: bowel sounds normal and soft and mild, diffuse subjective tenderness, no r/g.  Extremities: edema anasarca  Lab Results  Recent Labs  09/18/16 0255 09/19/16 0403  WBC 14.3* 14.8*  HGB 7.3* 9.4*  HCT 20.6* 28.7*  NA 130* 130*  K 3.9 3.0*  CL 107 106  CO2 17* 19*  BUN 25* 27*  CREATININE 1.50* 1.40*   Liver Panel  Recent Labs  09/18/16 0255 09/19/16 0403  PROT 3.7* 4.1*  ALBUMIN <1.0* 1.0*  AST 19 19  ALT 10* 10*  ALKPHOS 249* 292*  BILITOT 0.9 0.9   Sedimentation Rate No results for input(s): ESRSEDRATE in the last 72 hours. C-Reactive Protein No results for input(s): CRP in the last 72 hours.  Microbiology: Recent Results (from the past 240 hour(s))  Culture, Urine     Status: Abnormal   Collection Time: 09/16/16 10:49 AM  Result Value Ref Range Status   Specimen Description URINE, CLEAN CATCH  Final   Special Requests NONE  Final   Culture >=100,000 COLONIES/mL YEAST (A)  Final    Report Status 09/17/2016 FINAL  Final  Culture, blood (routine x 2)     Status: None (Preliminary result)   Collection Time: 09/16/16  9:34 PM  Result Value Ref Range Status   Specimen Description  BLOOD RIGHT HAND  Final   Special Requests IN PEDIATRIC BOTTLE Blood Culture adequate volume  Final   Culture NO GROWTH 3 DAYS  Final   Report Status PENDING  Incomplete  Culture, blood (routine x 2)     Status: None (Preliminary result)   Collection Time: 09/16/16  9:34 PM  Result Value Ref Range Status   Specimen Description BLOOD RIGHT HAND  Final   Special Requests IN PEDIATRIC BOTTLE Blood Culture adequate volume  Final   Culture  Setup Time   Final    YEAST IN PEDIATRIC BOTTLE CRITICAL RESULT CALLED TO, READ BACK BY AND VERIFIED WITH: G.ABBOTT, PHARMD 09/19/16 0330 L.CHAMPION    Culture YEAST CULTURE REINCUBATED FOR BETTER GROWTH   Final   Report Status PENDING  Incomplete  Gram stain     Status: None   Collection Time: 09/18/16 12:14 PM  Result Value Ref Range Status   Specimen Description PLEURAL LEFT  Final   Special Requests NONE  Final   Gram Stain   Final    MODERATE WBC PRESENT, PREDOMINANTLY PMN NO ORGANISMS SEEN    Report Status 09/18/2016 FINAL  Final  Culture, body fluid-bottle     Status: None (Preliminary result)   Collection Time: 09/18/16 12:14 PM  Result Value Ref Range Status   Specimen Description PLEURAL LEFT  Final   Special Requests NONE  Final   Culture NO GROWTH 1 DAY  Final   Report Status PENDING  Incomplete    Studies/Results: Dg Chest 1 View  Result Date: 09/18/2016 CLINICAL DATA:  Status post left thoracentesis today. EXAM: CHEST 1 VIEW COMPARISON:  CT chest 09/17/2016.  PA and lateral chest 09/08/2016. FINDINGS: Very small left pleural effusion is identified. No pneumothorax. Subsegmental atelectasis is present in the left lung base. The right lung is clear. Heart size is normal. Aortic stent graft noted. IMPRESSION: Minimal left pleural effusion  after thoracentesis. Negative for pneumothorax. Electronically Signed   By: Inge Rise M.D.   On: 09/18/2016 12:21   Dg Chest Port 1 View  Result Date: 09/19/2016 CLINICAL DATA:  Followup pleural effusion. EXAM: PORTABLE CHEST 1 VIEW COMPARISON:  09/18/2016 FINDINGS: Mild left lung base opacity is consistent with a combination of atelectasis and a small effusion, unchanged from the previous day's study. Remainder of the lungs is clear. Cardiac silhouette is normal in size. Stable thoracic aortic stent. No mediastinal or hilar masses. No pneumothorax. IMPRESSION: 1. No change from previous day's study. 2. Persistent left lung base opacity consistent with combination atelectasis and a probable small effusion. Electronically Signed   By: Lajean Manes M.D.   On: 09/19/2016 08:09   Ir Thoracentesis Asp Pleural Space W/img Guide  Result Date: 09/18/2016 INDICATION: Patient with failure to thrive and recent leukocytosis with no definite source. Recent imaging revealed a left-sided pleural effusion. Request is made for diagnostic and therapeutic thoracentesis. EXAM: ULTRASOUND GUIDED DIAGNOSTIC AND THERAPEUTIC THORACENTESIS MEDICATIONS: 1% lidocaine COMPLICATIONS: None immediate. PROCEDURE: An ultrasound guided thoracentesis was thoroughly discussed with the patient and questions answered. The benefits, risks, alternatives and complications were also discussed. The patient understands and wishes to proceed with the procedure. Written consent was obtained. Ultrasound was performed to localize and mark an adequate pocket of fluid in the left chest. The area was then prepped and draped in the normal sterile fashion. 1% Lidocaine was used for local anesthesia. Under ultrasound guidance a Safe-T-Centesis catheter was introduced. Thoracentesis was performed. The catheter was removed and a dressing applied.  FINDINGS: A total of approximately 0.65 L of clear pale yellow fluid was removed. Samples were sent to the  laboratory as requested by the clinical team. IMPRESSION: Successful ultrasound guided left thoracentesis yielding 0.65 L of pleural fluid. Read by: Saverio Danker, PA-C Electronically Signed   By: Corrie Mckusick D.O.   On: 09/18/2016 12:28     Assessment/Plan: Candidemia (colitis, urine, PIC present until 1.5 weeks prior to arrival) Change eraxis to fluconazole.  F/U aortic graft, hopefully not seeded.  Repeat BCx this AM  Colitis/Enteritis on CT scan Consider stopping cefepime/flagyl at day 14 WBC stable  ARF Cr improved  Explained to pt and family. Explained need for her to get out of bed and cooperate with medical care.   Total days of antibiotics: 1 eraxis --> fluconazole         11 cefepime/flagyl         Bobby Rumpf Infectious Diseases (pager) [phone] [email or web address] 09/20/2016, 11:55 AM  LOS: 11 days

## 2016-09-21 DIAGNOSIS — Z515 Encounter for palliative care: Secondary | ICD-10-CM

## 2016-09-21 DIAGNOSIS — B377 Candidal sepsis: Secondary | ICD-10-CM

## 2016-09-21 DIAGNOSIS — R627 Adult failure to thrive: Secondary | ICD-10-CM

## 2016-09-21 LAB — GLUCOSE, CAPILLARY
GLUCOSE-CAPILLARY: 115 mg/dL — AB (ref 65–99)
Glucose-Capillary: 96 mg/dL (ref 65–99)
Glucose-Capillary: 105 mg/dL — ABNORMAL HIGH (ref 65–99)
Glucose-Capillary: 83 mg/dL (ref 65–99)

## 2016-09-21 LAB — CBC WITH DIFFERENTIAL/PLATELET
Basophils Absolute: 0.2 10*3/uL — ABNORMAL HIGH (ref 0.0–0.1)
Basophils Relative: 2 %
EOS ABS: 0.1 10*3/uL (ref 0.0–0.7)
Eosinophils Relative: 1 %
HCT: 23.7 % — ABNORMAL LOW (ref 36.0–46.0)
Hemoglobin: 8.2 g/dL — ABNORMAL LOW (ref 12.0–15.0)
LYMPHS PCT: 21 %
Lymphs Abs: 1.7 10*3/uL (ref 0.7–4.0)
MCH: 33.1 pg (ref 26.0–34.0)
MCHC: 34.6 g/dL (ref 30.0–36.0)
MCV: 95.6 fL (ref 78.0–100.0)
MONOS PCT: 7 %
Monocytes Absolute: 0.6 10*3/uL (ref 0.1–1.0)
NEUTROS ABS: 5.4 10*3/uL (ref 1.7–7.7)
Neutrophils Relative %: 69 %
PLATELETS: 72 10*3/uL — AB (ref 150–400)
RBC: 2.48 MIL/uL — AB (ref 3.87–5.11)
RDW: 21.2 % — ABNORMAL HIGH (ref 11.5–15.5)
WBC: 8 10*3/uL (ref 4.0–10.5)

## 2016-09-21 LAB — COMPREHENSIVE METABOLIC PANEL
ALBUMIN: 1.2 g/dL — AB (ref 3.5–5.0)
ALT: 9 U/L — ABNORMAL LOW (ref 14–54)
ANION GAP: 3 — AB (ref 5–15)
AST: 19 U/L (ref 15–41)
Alkaline Phosphatase: 353 U/L — ABNORMAL HIGH (ref 38–126)
BUN: 30 mg/dL — ABNORMAL HIGH (ref 6–20)
CO2: 18 mmol/L — AB (ref 22–32)
Calcium: 7 mg/dL — ABNORMAL LOW (ref 8.9–10.3)
Chloride: 112 mmol/L — ABNORMAL HIGH (ref 101–111)
Creatinine, Ser: 1.31 mg/dL — ABNORMAL HIGH (ref 0.44–1.00)
GFR calc non Af Amer: 43 mL/min — ABNORMAL LOW (ref >60)
GFR, EST AFRICAN AMERICAN: 50 mL/min — AB (ref >60)
GLUCOSE: 97 mg/dL (ref 65–99)
Potassium: 3.3 mmol/L — ABNORMAL LOW (ref 3.5–5.1)
SODIUM: 133 mmol/L — AB (ref 135–145)
Total Bilirubin: 0.7 mg/dL (ref 0.3–1.2)
Total Protein: 4.1 g/dL — ABNORMAL LOW (ref 6.5–8.1)

## 2016-09-21 LAB — MAGNESIUM: Magnesium: 1.6 mg/dL — ABNORMAL LOW (ref 1.7–2.4)

## 2016-09-21 LAB — CULTURE, BLOOD (ROUTINE X 2)
Culture: NO GROWTH
Special Requests: ADEQUATE

## 2016-09-21 LAB — PHOSPHORUS: PHOSPHORUS: 2.4 mg/dL — AB (ref 2.5–4.6)

## 2016-09-21 MED ORDER — K PHOS MONO-SOD PHOS DI & MONO 155-852-130 MG PO TABS
500.0000 mg | ORAL_TABLET | Freq: Three times a day (TID) | ORAL | Status: DC
  Administered 2016-09-21 – 2016-09-23 (×5): 500 mg via ORAL
  Filled 2016-09-21 (×10): qty 2

## 2016-09-21 MED ORDER — MAGNESIUM SULFATE 2 GM/50ML IV SOLN
2.0000 g | Freq: Once | INTRAVENOUS | Status: AC
  Administered 2016-09-21: 2 g via INTRAVENOUS
  Filled 2016-09-21: qty 50

## 2016-09-21 MED ORDER — POTASSIUM CHLORIDE CRYS ER 20 MEQ PO TBCR
40.0000 meq | EXTENDED_RELEASE_TABLET | Freq: Every day | ORAL | Status: DC
  Administered 2016-09-21: 40 meq via ORAL
  Filled 2016-09-21: qty 2

## 2016-09-21 NOTE — Progress Notes (Signed)
Physical Therapy Treatment Patient Details Name: Haley Graham MRN: [id number] DOB: January 28, 1956 Today's Date: 09/21/2016    History of Present Illness This 61 y.o. female admitted with sepsis, hypotension, dehydration, amemia, COPD, AKI due to poor oral intake and hypotension/dehydration.  PMH  includes:  Recent prolonged hospitalization for [place] type B aortic dissection s/p descending thoracic aortic stent graft placement; anxiety, GERD, COPD,     PT Comments    Patient required max encouragement by RN and PT to participate.  Patient agreed to sit EOB for 2 minutes.  Patient requiring max assist for transitions.  Able to sit EOB x 3 minutes.  Glad to see her participate today, even for minimal movement.    Follow Up Recommendations  SNF     Equipment Recommendations  Other (comment) (TBD)    Recommendations for Other Services       Precautions / Restrictions Precautions Precautions: Fall Restrictions Weight Bearing Restrictions: No    Mobility  Bed Mobility Overal bed mobility: Needs Assistance Bed Mobility: Rolling;Sidelying to Sit;Sit to Sidelying Rolling: Max assist Sidelying to sit: Max assist     Sit to sidelying: Max assist General bed mobility comments: Verbal cues for technique.  Assist to initiate rolling at shoulder and arm.  Assisted patient's hand to rail. Patient was able to use UE to assist.  Flexed patient's knees and initiated rolling hips forward.  Max assist to move LE's off of bed and raise trunk.  Once upright, patient required min assist to maintain sitting balance for 3 minutes.  Returned to sidelying with max assist to raise LE's onto bed.  Transfers                 General transfer comment: Patient declined.  Ambulation/Gait                 Stairs            Wheelchair Mobility    Modified Rankin (Stroke Patients Only)       Balance   Sitting-balance support: Bilateral upper extremity supported;Feet  supported Sitting balance-Leahy Scale: Poor Sitting balance - Comments: UE support and min assist for sitting balance                                    Cognition Arousal/Alertness: Awake/alert Behavior During Therapy: Flat affect Overall Cognitive Status: Impaired/Different from baseline Area of Impairment: Safety/judgement;Problem solving                         Safety/Judgement: Decreased awareness of safety   Problem Solving: Slow processing;Decreased initiation;Difficulty sequencing;Requires verbal cues        Exercises      General Comments        Pertinent Vitals/Pain Pain Assessment: Faces Faces Pain Scale: Hurts a little bit Pain Location: abdomen Pain Descriptors / Indicators: Grimacing Pain Intervention(s): Monitored during session;Repositioned    Home Living                      Prior Function            PT Goals (current goals can now be found in the care plan section) Acute Rehab PT Goals Patient Stated Goal: None stated Progress towards PT goals: Progressing toward goals    Frequency    Min 3X/week      PT Plan Current plan remains appropriate  Co-evaluation              AM-PAC PT "6 Clicks" Daily Activity  Outcome Measure  Difficulty turning over in bed (including adjusting bedclothes, sheets and blankets)?: Unable Difficulty moving from lying on back to sitting on the side of the bed? : Unable Difficulty sitting down on and standing up from a chair with arms (e.g., wheelchair, bedside commode, etc,.)?: Unable Help needed moving to and from a bed to chair (including a wheelchair)?: Total Help needed walking in hospital room?: Total Help needed climbing 3-5 steps with a railing? : Total 6 Click Score: 6    End of Session   Activity Tolerance: Patient limited by fatigue Patient left: in bed;with call bell/phone within reach;with bed alarm set;with SCD's reapplied Nurse Communication: Mobility  status PT Visit Diagnosis: Muscle weakness (generalized) (M62.81);Other abnormalities of gait and mobility (R26.89);Unsteadiness on feet (R26.81);Adult, failure to thrive (R62.7)     Time: 7035-0093 PT Time Calculation (min) (ACUTE ONLY): 10 min  Charges:  $Therapeutic Activity: 8-22 mins                    G Codes:       Carita Pian. Sanjuana Kava, [facility] Acute Rehab Services Pager [place] 09/21/2016, 4:34 PM

## 2016-09-21 NOTE — Progress Notes (Signed)
Patient ambulated to [facility] requiring continual encouragement and reassurance. Patient began to feel "shaky" while sitting on BSC but was able to stand with assist and back to bed safely. Patient had large, black, watery BM, peri care provided. RN provided encouragement and praise to patient and provided education on deconditioning and importance of increasing activity slowly. Patient reluctant but v/u. Patient resting confortably. RN will continue to monitor patient.

## 2016-09-21 NOTE — Progress Notes (Signed)
PROGRESS NOTE    Haley Graham  TSV:[id number] DOB: [birth year] DOA: 09/15/2016 PCP: Glenda Chroman, MD   Brief Narrative: Haley Graham is a 61 y.o. 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 She presented with weakness and met sepsis criteria with concern for enteritis. Antibiotics started.   Patient has worsening Cr and Leukocytosis worsened and now improved. Complaining of Nausea and Abdominal Pain so will re-culture and repeat Imaging. Appears depressed and does not want to eat and just wants to lay in bed so will get Psych evaluation and place patient on Marinol. Discussed with Husband about Palliative Care Consult and he declined at this time. Patient refusing care and does not want to participate. Psych Evaluated and changed medications around. Patient also was found to have a Left Pleural Effusion and underwent Thoracentesis and also was transfused 1 unit of pRBCs on 09/18/16.   This AM she was found to have a Fungemia with 1/2 Blood Cx growing Yeast so Fluconazole was D/C'd and patient changed to Eraxis. Infectious Diseases was consulted and recommended getting repeat Imaging of Graft as she was high risk of Seeding. Sensitivities of Yeast revealed Candidemia so Eraxis was changed back to Fluconazole.  Patient was up in a chair today and numbers are improving. Cefepime and Metronidazole were discontinued and Repeat Blood Cx showed NGTD <24 Hours. Palliative Care met with the patient today and Goals of Care to be discussed.   Assessment & Plan:   Principal Problem:   Candidemia ([place]) Active Problems:   GERD (gastroesophageal reflux disease)   Dissection of  thoracoabdominal aorta (HCC)   AAA (abdominal aortic aneurysm) (HCC)   Benign essential HTN   Diabetes mellitus type 2 in obese (HCC)   Anxiety state   Hyponatremia   Leukocytosis   Sepsis (HCC)   Hypotension   AKI (acute kidney injury) ([place])   Dehydration   Generalized weakness   Anemia   Thrombocytopenia (HCC)   Abnormal computed tomography angiography (CTA) of abdomen and pelvis   Colitis: Probable   Protein-calorie malnutrition, severe   Palliative care by specialist   Adult failure to thrive   Hypophosphatemia   Hypomagnesemia  Sepsis, improving  -Suspected Secondary to colitis/enteritis but patient found to have a Bacteremia with Yeast and grew out Candida Albicans -WBC worsening and went from 16.7 -> 26.9 -> 19.9 -> 14.3 -> 14.8 -> 9.5 -> 8.0 -Physiology improved with IV fluids and antibiotics. Has some tachycardia.  -GI pathogen panel and C. Diff negative.  -Initial Urine/blood culture negative. Repeat U/A shows evidence of UTI with Many bacteria, Large Hb, Trace Leukocytes, Positive Nitrites, TNTC RBC, TNTC WBC -IV Flagyl and Cefepime now Discontinued per ID Recc's -Repeat CT Chest/Abd/Pelvis w/o Contrast done and showed Pleural Effusion -Continue to Monitor  -Yeast grew un UTI so will treat with Fluconazole; Fluconazole now D/C'd and started Eraxis as patient has a Fungemia; Eraxis now changed back to Fluconazole because of Blood Cx revealing Candida Albicans -Repeat Blood Cx showed NGTD -Will need reimaging of Graft as she is at high risk of seeding and likely repeat Limited ECHO -Will likely also need Opthamology Consult at some point   -Palliative Care Consulted and encouraged patient and will Meet in AM to Discuss Code Status   Candidemia -  As above -Repeat Blood Cx grew 1/2 Yeast and was Candida Albicans; Repeat Blood Cx showed no growth today  -Continue Fluconazole per ID Recc's  -Will need reimaging of Graft as she is at high risk of seeding and likely repeat  Limited ECHO -Infectious Disease following and appreciated Recc's  Hypotension -Improved with IV fluids which has made her anasarca worse. Troponin negative. Albumin extremely low. -Gave 1x dose of IV Albumin on 09/19/16 -monitor BP  Dehydration/Poor oral intake/FTT -encourage oral intake -dietitian consulted: protein supplementation as patient has Significant Hypoalbuminemia and LE swelling  -Will add Marinol to help appetite Stimulant; Increased to 5 mg po BID  -Albumin was 1.2   Normocytic Anemia -Hb/Hct went from 8.6/26.2 -> 9.1/26.0 -> 8.9/26.6 -> 7.3/20.6 -> 9.4/28.7 -> 8.9/26.5 -> 8.2/23.7 -Type and Screen and Transfused 1 unit of pRBC on 8/17 -Has some Hematuria and dark urine -Repeat CBC in AM  COPD Stable. -continue nebulizers  Acute Kidney Injury -Suspected Secondary to hypotension and Hx of Atrophic Left Kidney -Increased Diurectics to 40 mg daily and given IV Albumin today -BUN/Cr went from 16/1.12 -> 18/1.33 -> 20/1.38 -> 25/1.50 -> 27/1.40 -> 32/1.27 -> 30/1.31 -Continue to Monitor and Avoid Nephrotoxics -Repeat CMP in AM   Anasarca -Secondary to IV fluids. Recent echo significant for grade 1 diastolic dysfunction. Repeat echocardiogram significant for an EF of 65-70% with grade 1 diastolic dysfunction and trace mitral regurg and tricuspid regurg -Lasix was increased to 40 mg daily; Given IV Albumin on 09/20/16 -Albumin was 1.2 -CT scan showed Small amount of abdominal and pelvic ascites. Diffuse body wall edema consistent with anasarca  Oliguria -Improvement with diuresis -strict in/out -Patient +3.073 Liters since Admit  Yeast UTI ->100,000 CFU's -Treated With IV Fluconazole but now changed to Eraxis now that patient has a Fungemia; Changed back to IV Fluconazole now that Cx grew out Candida Albicans  -Treatment per ID Recc's  Hematuria -Tea colored urine. Decreased urine output initially which is now improved. Liver unremarkable for acute  process. Vascular surgery without recommendations for thrombosis on consultation. -Monitor and repeated U/A today  -CT Scan showed Negative for nephrolithiasis, hydronephrosis or ureteral stone -May need to discuss with Urology -Foley Catheter discontinued; Continue to Monitor UOP and Strict I's and O's  Hyponatremia -Went from 130 -> 133 Likely secondary to third spacing. -Continue to Monitor and repeat CMP in AM   Elevated alkaline phosphatase S/p cholecystecomy. GGT elevated. Alkaline phosphatase improving. RUQ unremarkable except for fatty liver  Initial Abnormal CT angiogram of chest/abdomen/pelvis -Thrombus of left renal artery. Vascular surgery consulted with no recommendations for acute management. -Repeat CT Scan showed Displaced wall calcification in the infrarenal abdominal aorta consistent with known thrombosed dissection.  Thrombocytopenia -Presumed secondary to acute illness.  -Platelet Count now 45 -Dr. Lonny Prude had discussed with hematologist, Dr. Julien Nordmann who recommends continued surveillance.  -Can follow-up as an outpatient if not improved by discharge. -Pathology smear review showed Leukocytosis with predominance of neutrophils with toxic features. Rare circulating myelocyte.  -Repat CBC in AM   GERD Stable. -continue PPI  Depression/Anxiety -Discontinued Klonipin -Consulted Psychiatry Dr. Louretta Shorten and appreciated Assistance -Psychiatry changed Xanax 0.25 mg BID and discontinued Celexa; Patient was started on Cymbalta 40 mg po Daily for Depression  -C/w Marinol for Appetite   [place] type B aortic dissection -Patient is s/p stent graft placement on previous admission. -CT showed stable non contrasted appearance of the stent graft at the aortic arch and proximal descending thoracic aorta -Continue metoprolol at  reduce dose  Tachycardia -Rebound secondary to abrupt cessation of beta-blocker. -continue metoprolol at reduced dose of 12.24m  BID  Leukocytosis -Patient without new source of infection but will reculture. Afebrile.  -WBC went from 26.9 -> 19.9 -> 14.3 -> 14.8 -> 9.5 -> 8.0 -Inital Blood cultures negative and Repeat showed 1/2 Yeast -Pro calcitonin 0.24 on admission. -Repeat Imaging showed Bilateral pleural effusions, small on the right and moderate on the left. Worsening consolidation in the lower lobes may reflect atelectasis or pneumonia. -Patient Currently on Abx as above  Hypokalemia -K+ was 3.3 -Replete with Potassium Chloride 40 mEQ Daily and with KPhos Neutral 500 mg po TID daily -Continue to Monitor and Replete as Necessary -Repeat CMP in AM  Hypophosphatemia -Patient's Phos Level was 2.4 -Replete with K Phos 500 mg po TID -Continue to Monitor and Replete as Necessary -Repeat Phos Level in AM   Hypomagnesemia -Patient's Mag Level was 1.6 -Replete with IV Mag Sulfate 2 grams  -Continue to Monitor and Replete as Necessary -Repeat Mag Level in AM  Pleural Effusion on Left -U/S Guided Thoracentesis Ordered 09/19/17 -Resulted 0.65 mL of Fluid Removal -Repeat CXR this AM showed No change from previous day's study. 2. Persistent left lung base opacity consistent with combination atelectasis and a probable small effusion  DVT prophylaxis: SCDs as patient's platelet count remain low Code Status: FULL CODE Family Communication: Discussed with Husband and Daughter at bedside  Disposition Plan: Remain Inpatient and D/C to SNF  Consultants:   Vascular Surgery  Case was discussed with Dr. MJulien Nordmann  Psychiatry    Procedures:  ABI Left Thoracentesis    Antimicrobials:  Anti-infectives    Start     Dose/Rate Route Frequency Ordered Stop   09/21/16 1400  fluconazole (DIFLUCAN) IVPB 400 mg  Status:  Discontinued     400 mg 100 mL/hr over 120 Minutes Intravenous Every 24 hours 09/20/16 1220 09/20/16 1221   09/21/16 0000  anidulafungin (ERAXIS) 50 mg in sodium chloride 0.9 % 50 mL IVPB   Status:  Discontinued     50 mg 78 mL/hr over 50 Minutes Intravenous Every 24 hours 09/19/16 0345 09/19/16 0346   09/20/16 1400  fluconazole (DIFLUCAN) IVPB 800 mg  Status:  Discontinued     800 mg 200 mL/hr over 120 Minutes Intravenous  Once 09/20/16 1220 09/20/16 1221   09/20/16 1400  fluconazole (DIFLUCAN) IVPB 400 mg     400 mg 100 mL/hr over 120 Minutes Intravenous Every 24 hours 09/20/16 1221     09/19/16 2200  anidulafungin (ERAXIS) 100 mg in sodium chloride 0.9 % 100 mL IVPB  Status:  Discontinued     100 mg 78 mL/hr over 100 Minutes Intravenous Every 24 hours 09/19/16 0345 09/20/16 1220   09/19/16 0400  anidulafungin (ERAXIS) 200 mg in sodium chloride 0.9 % 200 mL IVPB     200 mg 78 mL/hr over 200 Minutes Intravenous  Once 09/19/16 0345 09/19/16 0819   09/18/16 1800  fluconazole (DIFLUCAN) IVPB 100 mg  Status:  Discontinued     100 mg 50 mL/hr over 60 Minutes Intravenous Every 24 hours 09/18/16 1109 09/19/16 1937   09/17/16 1800  ceFEPIme (MAXIPIME) 2 g in dextrose 5 % 50 mL IVPB  Status:  Discontinued     2 g 100 mL/hr over 30 Minutes Intravenous Every 24 hours 09/17/16 1243 09/21/16 1200   09/17/16 1800  fluconazole (DIFLUCAN) IVPB 200 mg  Status:  Discontinued     200 mg 100  mL/hr over 60 Minutes Intravenous Every 24 hours 09/17/16 1651 09/18/16 1109   09/12/16 1400  ceFEPIme (MAXIPIME) 1 g in dextrose 5 % 50 mL IVPB  Status:  Discontinued     1 g 100 mL/hr over 30 Minutes Intravenous Every 8 hours 09/12/16 1127 09/17/16 1243   09/11/16 1600  metroNIDAZOLE (FLAGYL) IVPB 500 mg  Status:  Discontinued     500 mg 100 mL/hr over 60 Minutes Intravenous Every 8 hours 09/11/16 0902 09/21/16 1200   09/11/16 1400  ceFEPIme (MAXIPIME) 1 g in dextrose 5 % 50 mL IVPB  Status:  Discontinued     1 g 100 mL/hr over 30 Minutes Intravenous Every 24 hours 09/11/16 1234 09/12/16 1127   09/10/16 2000  vancomycin (VANCOCIN) IVPB 1000 mg/200 mL premix  Status:  Discontinued     1,000 mg 200  mL/hr over 60 Minutes Intravenous Every 24 hours 10/01/2016 2130 09/10/16 1616   09/10/16 1800  vancomycin (VANCOCIN) IVPB 750 mg/150 ml premix  Status:  Discontinued     750 mg 150 mL/hr over 60 Minutes Intravenous Every 12 hours 09/10/16 1616 09/12/16 1145   09/10/16 1400  levofloxacin (LEVAQUIN) IVPB 500 mg  Status:  Discontinued     500 mg 100 mL/hr over 60 Minutes Intravenous Every 24 hours 09/04/2016 2112 09/11/16 1220   09/10/16 0115  vancomycin (VANCOCIN) IVPB 1000 mg/200 mL premix  Status:  Discontinued     1,000 mg 200 mL/hr over 60 Minutes Intravenous  Once 09/10/16 0110 09/10/16 0118   10/02/2016 2200  metroNIDAZOLE (FLAGYL) IVPB 500 mg  Status:  Discontinued     500 mg 100 mL/hr over 60 Minutes Intravenous Every 6 hours 09/14/2016 2110 09/11/16 0902   09/26/2016 2115  vancomycin (VANCOCIN) IVPB 1000 mg/200 mL premix     1,000 mg 200 mL/hr over 60 Minutes Intravenous  Once 09/28/2016 2108 09/08/2016 2323   09/18/2016 1430  levofloxacin (LEVAQUIN) IVPB 750 mg     750 mg 100 mL/hr over 90 Minutes Intravenous  Once 09/13/2016 1415 09/07/2016 2152   09/18/2016 1430  metroNIDAZOLE (FLAGYL) IVPB 500 mg     500 mg 100 mL/hr over 60 Minutes Intravenous  Once 09/25/2016 1415 09/04/2016 1850   09/26/2016 1430  levofloxacin (LEVAQUIN) IVPB 750 mg  Status:  Discontinued     750 mg 100 mL/hr over 90 Minutes Intravenous  Once 09/30/2016 1416 09/21/2016 1416     Subjective: Seen and examined at bedside and was sitting in chair. Patient's husband was encouraging her to move and patient sat in chair for 1.5 hours yesterday. Going to work with Physical Therapy. Still nauseous but was chronically nauseous. No other concerns or complaints at this time.   Objective: Vitals:   09/20/16 2240 09/21/16 0056 09/21/16 0643 09/21/16 1417  BP:  128/75 (!) 145/80 125/66  Pulse: 78  91 84  Resp:    18  Temp:   97.6 F (36.4 C) 98.1 F (36.7 C)  TempSrc:   Oral Oral  SpO2: 97%  97% 98%  Weight:   79.5 kg (175 lb 4.3 oz)    Height:        Intake/Output Summary (Last 24 hours) at 09/21/16 1758 Last data filed at 09/21/16 1300  Gross per 24 hour  Intake              720 ml  Output             1100 ml  Net             -  380 ml   Filed Weights   09/19/16 0518 09/20/16 0449 09/21/16 0643  Weight: 78.9 kg (173 lb 15.1 oz) 80.4 kg (177 lb 4 oz) 79.5 kg (175 lb 4.3 oz)   Examination: Physical Exam:  Constitutional: WN/WD Caucasian female who appears depressed but sitting in chair willing to try with husband's motivation  Eyes: Sclerae anicertic. Conjunctivae Non-injected ENMT: Grossly normal hearing. Mucous membranes appear moist Neck: Supple with no JVD Respiratory: Diminished but no wheezing/rales/rhonchi. Patient was not tachypenic or using any accessory muscles to breathe Cardiovascular: RRR; 3+ Upper and Lower Extremity swelling Abdomen:  Soft, Mildly tender to palpate. Distended due to body habitus GU: Deferred. Foley catheter has been removed Musculoskeletal: No contractures; No cyanosis Skin: Has anasarca and lower Extremity bruise/wound on left leg.  Neurologic: CN 2-12 grossly intact. No appreciable focal deficits Psychiatric: Depressed mood and affect. Intact judgement and insight. Awake and Alert  Data Reviewed: I have personally reviewed following labs and imaging studies  CBC:  Recent Labs Lab 09/17/16 0337 09/18/16 0255 09/19/16 0403 09/20/16 2007 09/21/16 0357  WBC 19.9* 14.3* 14.8* 9.5 8.0  NEUTROABS 17.0* 11.6* 12.6*  --  5.4  HGB 8.9* 7.3* 9.4* 8.9* 8.2*  HCT 26.6* 20.6* 28.7* 26.5* 23.7*  MCV 95.7 98.1 92.3 92.7 95.6  PLT 103* 57* 64* 72* 72*   Basic Metabolic Panel:  Recent Labs Lab 09/17/16 0337 09/18/16 0255 09/19/16 0403 09/20/16 2214 09/21/16 0357  NA 131* 130* 130* 133* 133*  K 3.7 3.9 3.0* 3.9 3.3*  CL 107 107 106 111 112*  CO2 18* 17* 19* 15* 18*  GLUCOSE 102* 84 103* 105* 97  BUN 20 25* 27* 32* 30*  CREATININE 1.38* 1.50* 1.40* 1.37* 1.31*  CALCIUM  7.0* 6.9* 7.0* 7.2* 7.0*  MG 1.5* 1.8 1.9 1.8 1.6*  PHOS 3.8 3.8 3.5 2.6 2.4*   GFR: Estimated Creatinine Clearance: 45.6 mL/min (A) (by C-G formula based on SCr of 1.31 mg/dL (H)). Liver Function Tests:  Recent Labs Lab 09/17/16 0337 09/18/16 0255 09/19/16 0403 09/20/16 2214 09/21/16 0357  AST '18 19 19 24 19  ' ALT 13* 10* 10* 10* 9*  ALKPHOS 253* 249* 292* 345* 353*  BILITOT 0.8 0.9 0.9 0.9 0.7  PROT 4.5* 3.7* 4.1* 3.9* 4.1*  ALBUMIN 1.1* <1.0* 1.0* 1.2* 1.2*   No results for input(s): LIPASE, AMYLASE in the last 168 hours. No results for input(s): AMMONIA in the last 168 hours. Coagulation Profile: No results for input(s): INR, PROTIME in the last 168 hours. Cardiac Enzymes: No results for input(s): CKTOTAL, CKMB, CKMBINDEX, TROPONINI in the last 168 hours. BNP (last 3 results) No results for input(s): PROBNP in the last 8760 hours. HbA1C: No results for input(s): HGBA1C in the last 72 hours. CBG:  Recent Labs Lab 09/20/16 1804 09/20/16 2128 09/21/16 0641 09/21/16 1237 09/21/16 1657  GLUCAP 115* 117* 96 115* 105*   Lipid Profile: No results for input(s): CHOL, HDL, LDLCALC, TRIG, CHOLHDL, LDLDIRECT in the last 72 hours. Thyroid Function Tests: No results for input(s): TSH, T4TOTAL, FREET4, T3FREE, THYROIDAB in the last 72 hours. Anemia Panel: No results for input(s): VITAMINB12, FOLATE, FERRITIN, TIBC, IRON, RETICCTPCT in the last 72 hours. Sepsis Labs: No results for input(s): PROCALCITON, LATICACIDVEN in the last 168 hours.  Recent Results (from the past 240 hour(s))  Culture, Urine     Status: Abnormal   Collection Time: 09/16/16 10:49 AM  Result Value Ref Range Status   Specimen Description URINE, CLEAN CATCH  Final  Special Requests NONE  Final   Culture >=100,000 COLONIES/mL YEAST (A)  Final   Report Status 09/17/2016 FINAL  Final  Culture, blood (routine x 2)     Status: None   Collection Time: 09/16/16  9:34 PM  Result Value Ref Range Status    Specimen Description BLOOD RIGHT HAND  Final   Special Requests IN PEDIATRIC BOTTLE Blood Culture adequate volume  Final   Culture NO GROWTH 5 DAYS  Final   Report Status 09/21/2016 FINAL  Final  Culture, blood (routine x 2)     Status: Abnormal   Collection Time: 09/16/16  9:34 PM  Result Value Ref Range Status   Specimen Description BLOOD RIGHT HAND  Final   Special Requests IN PEDIATRIC BOTTLE Blood Culture adequate volume  Final   Culture  Setup Time   Final    YEAST IN PEDIATRIC BOTTLE CRITICAL RESULT CALLED TO, READ BACK BY AND VERIFIED WITH: G.ABBOTT, PHARMD 09/19/16 0330 L.CHAMPION    Culture CANDIDA ALBICANS (A)  Final   Report Status 09/20/2016 FINAL  Final  Gram stain     Status: None   Collection Time: 09/18/16 12:14 PM  Result Value Ref Range Status   Specimen Description PLEURAL LEFT  Final   Special Requests NONE  Final   Gram Stain   Final    MODERATE WBC PRESENT, PREDOMINANTLY PMN NO ORGANISMS SEEN    Report Status 09/18/2016 FINAL  Final  Culture, body fluid-bottle     Status: None (Preliminary result)   Collection Time: 09/18/16 12:14 PM  Result Value Ref Range Status   Specimen Description PLEURAL LEFT  Final   Special Requests NONE  Final   Culture NO GROWTH 3 DAYS  Final   Report Status PENDING  Incomplete  Culture, blood (Routine X 2) w Reflex to ID Panel     Status: None (Preliminary result)   Collection Time: 09/20/16  3:01 PM  Result Value Ref Range Status   Specimen Description BLOOD BLOOD RIGHT HAND  Final   Special Requests IN PEDIATRIC BOTTLE Blood Culture adequate volume  Final   Culture NO GROWTH < 24 HOURS  Final   Report Status PENDING  Incomplete    Radiology Studies: No results found. Scheduled Meds: . ALPRAZolam  0.25 mg Oral BID  . ARIPiprazole  5 mg Oral Daily  . aspirin EC  81 mg Oral Daily  . dronabinol  5 mg Oral BID AC  . DULoxetine  40 mg Oral Daily  . feeding supplement (ENSURE ENLIVE)  237 mL Oral TID BM  . feeding  supplement (PRO-STAT SUGAR FREE 64)  30 mL Oral BID  . furosemide  40 mg Oral Daily  . insulin aspart  0-5 Units Subcutaneous QHS  . insulin aspart  0-9 Units Subcutaneous TID WC  . metoprolol tartrate  12.5 mg Oral BID  . pantoprazole (PROTONIX) IV  40 mg Intravenous Q24H  . phosphorus  500 mg Oral TID  . potassium chloride  40 mEq Oral Daily  . sodium chloride flush  3 mL Intravenous Q12H   Continuous Infusions: . fluconazole (DIFLUCAN) IV Stopped (09/21/16 1324)    LOS: 12 days   Kerney Elbe, DO [facility] Pager [phone]  If 7PM-7AM, please contact night-coverage [email or web address] Password [facility] 09/21/2016, 5:58 PM

## 2016-09-21 NOTE — Progress Notes (Signed)
Patient ID: Haley Graham, female   DOB: [birth year], 61 y.o.   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 She has chronic abdominal pain which may be mesenteric ischemia. She had a prolonged hospital stay and was discharged home on 08/22/2016. She became progressively weak and was having multiple falls leading to readmission on 09/15/2016. She has been afebrile throughout this hospitalization. She did have leukocytosis with a white count of 21,000 on admission. She underwent CT scanning of the abdomen and pelvis which showed some bowel wall enhancement compatible with "enteritis/colitis". She was started on broad empiric antibiotic therapy. Initial blood cultures were negative. She has continued to have abdominal pain. As a result blood cultures were repeated on 09/16/2016 and one of 2 sets is growing Candida albicans. Blood cultures done yesterday are negative so far. I do not feel that she needs continued therapy with cefepime, metronidazole and will stop them now. I will continue high-dose fluconazole. Ideally, she should have a dilated ophthalmologic exam within the next few weeks fluid for any evidence of endophthalmitis associated with fungemia.  PLAN: 1. Continue fluconazole 2. Discontinue cefepime and metronidazole 3. Await results of repeat blood cultures 4. I will follow-up on Wednesday, 09/23/2016  Principal Problem:   Sepsis ([place]) Active Problems:   GERD (gastroesophageal reflux disease)   Dissection of thoracoabdominal aorta (HCC)   AAA (abdominal aortic aneurysm) (HCC)   Benign essential HTN   Diabetes mellitus type 2 in obese (HCC)   Anxiety state   Hyponatremia   Leukocytosis   Hypotension   AKI (acute kidney injury) ([place])   Dehydration   Weakness   Anemia   Thrombocytopenia (HCC)   Abnormal computed tomography angiography (CTA) of abdomen and pelvis   Colitis: Probable   Protein-calorie malnutrition, severe    SUBJECTIVE: She states that she is feeling tired.  Review of Systems: Review of Systems  Unable to perform ROS: Patient nonverbal    Allergies  Allergen Reactions  . Penicillins Rash and Other (See Comments)    PATIENT HAS HAD A PCN REACTION WITH IMMEDIATE RASH, FACIAL/TONGUE/THROAT SWELLING, SOB, OR LIGHTHEADEDNESS WITH HYPOTENSION:  #  #  #  YES  #  #  #   Has patient had a PCN reaction causing severe rash involving mucus membranes or skin necrosis: no Has patient had a PCN reaction that required hospitalization no Has patient had a PCN reaction occurring within the last 10 years:no If all of the above answers are "NO", then may proceed with Cephalosporin use.  . Ativan [Lorazepam] Other (See Comments)    Makes pt very confused and more agitated, irritable.  . Codeine Nausea And Vomiting  . Reglan [Metoclopramide] Other (See Comments)    TACHYCARDIA    OBJECTIVE: Vitals:   09/20/16 2130 09/20/16 2240 09/21/16 0056 09/21/16 0643  BP: 115/65  128/75 (!) 145/80  Pulse:  78  91  Resp:      Temp: 97.8 F (36.6 C)   97.6 F (36.4 C)  TempSrc: Oral   Oral  SpO2:  97%  97%  Weight:    175 lb 4.3 oz (79.5 kg)  Height:  Body mass index is 31.05 kg/m.  Physical Exam  Constitutional:  She is resting quietly in bed. She is very withdrawn and does not respond to most questions.  Cardiovascular: Normal rate and regular rhythm.   No murmur heard. Pulmonary/Chest: Effort normal and breath sounds normal. She has no wheezes. She has no rales.  Abdominal: Soft. There is tenderness.  Mild upper quadrant tenderness with palpation.  Musculoskeletal: Normal range of motion. She exhibits no edema or tenderness.    Lab Results Lab Results  Component Value Date   WBC 8.0 09/21/2016   HGB 8.2 (L)  09/21/2016   HCT 23.7 (L) 09/21/2016   MCV 95.6 09/21/2016   PLT 72 (L) 09/21/2016    Lab Results  Component Value Date   CREATININE 1.31 (H) 09/21/2016   BUN 30 (H) 09/21/2016   NA 133 (L) 09/21/2016   K 3.3 (L) 09/21/2016   CL 112 (H) 09/21/2016   CO2 18 (L) 09/21/2016    Lab Results  Component Value Date   ALT 9 (L) 09/21/2016   AST 19 09/21/2016   ALKPHOS 353 (H) 09/21/2016   BILITOT 0.7 09/21/2016     Microbiology: Recent Results (from the past 240 hour(s))  Culture, Urine     Status: Abnormal   Collection Time: 09/16/16 10:49 AM  Result Value Ref Range Status   Specimen Description URINE, CLEAN CATCH  Final   Special Requests NONE  Final   Culture >=100,000 COLONIES/mL YEAST (A)  Final   Report Status 09/17/2016 FINAL  Final  Culture, blood (routine x 2)     Status: None (Preliminary result)   Collection Time: 09/16/16  9:34 PM  Result Value Ref Range Status   Specimen Description BLOOD RIGHT HAND  Final   Special Requests IN PEDIATRIC BOTTLE Blood Culture adequate volume  Final   Culture NO GROWTH 4 DAYS  Final   Report Status PENDING  Incomplete  Culture, blood (routine x 2)     Status: Abnormal   Collection Time: 09/16/16  9:34 PM  Result Value Ref Range Status   Specimen Description BLOOD RIGHT HAND  Final   Special Requests IN PEDIATRIC BOTTLE Blood Culture adequate volume  Final   Culture  Setup Time   Final    YEAST IN PEDIATRIC BOTTLE CRITICAL RESULT CALLED TO, READ BACK BY AND VERIFIED WITH: G.ABBOTT, PHARMD 09/19/16 0330 L.CHAMPION    Culture CANDIDA ALBICANS (A)  Final   Report Status 09/20/2016 FINAL  Final  Gram stain     Status: None   Collection Time: 09/18/16 12:14 PM  Result Value Ref Range Status   Specimen Description PLEURAL LEFT  Final   Special Requests NONE  Final   Gram Stain   Final    MODERATE WBC PRESENT, PREDOMINANTLY PMN NO ORGANISMS SEEN    Report Status 09/18/2016 FINAL  Final  Culture, body fluid-bottle     Status: None  (Preliminary result)   Collection Time: 09/18/16 12:14 PM  Result Value Ref Range Status   Specimen Description PLEURAL LEFT  Final   Special Requests NONE  Final   Culture NO GROWTH 2 DAYS  Final   Report Status PENDING  Incomplete    Michel Bickers, MD [facility] [phone] pager   [phone] cell 09/21/2016, 11:22 AM

## 2016-09-21 NOTE — Consult Note (Signed)
Consultation Note Date: 09/21/2016   Patient Name: Haley Graham  DOB: 06/30/1955  MRN: [id number]  Age / Sex: 61 y.o., female  PCP: Glenda Chroman, MD Referring Physician: Kerney Elbe, DO  Reason for Consultation: Establishing goals of care and Psychosocial/spiritual support  HPI/Patient Profile: 61 y.o. female   admitted on 09/26/2016 with medical history significant of [place] type B aortic dissection status post descending thoracic aortic stent graft placement per vascular surgery during last hospitalization from 06/20/2016-08/22/2016, a history of diabetes mellitus with gastroparesis, gastroesophageal reflux disease, hypertension, IBS, abdominal aortic aneurysm,  Anxiety and depression.     Admitted through the ED from cardiothoracic office with generalized weakness. Reported generalized weakness over the past 7 days, was unable to ambulate with  nausea and decreased oral intake.   Her husband today speaks to a overall failure to thrive since her surgery. He has tried to encourage her to eat and participate in physical therapy, but most times she was unmotivated to push through.  Infectious disease  and psychiatry are involved in this patient's care.  Patient and her family face treatment option decisions and advanced directives decisions.   Clinical Assessment and Goals of Care:   This NP Wadie Lessen reviewed medical records, received report from team, assessed the patient and then meet at the patient's bedside along with her husband  to discuss diagnosis, prognosis, GOC, and options.  Patient was resistant to conversation with me today however agree that I could talk with both her and her husband for a short while.  Patient made very little eye contact.  Her husband shared the patient's story since her significant surgery in May for a descending thoracic aortic stent graft.  It has been  emotionally and physically difficult. Over the past many weeks patient has had little motivation to participate in any activities to promote wellness.  Her husband is clearly frustrated and tearful with this and tries to be as encouraging and motivating as possible. He tells the patient today "we will get through this", but patient responds "I'm done".   With that patient asks to conversation be ended.  I suggested we meet again in the morning and see how she feels about things then.  Her husband agrees to meet again in the morning  A  discussion was had today regarding advanced directives.  Concepts specific to code status was had.  Patient and husband are interested in securing advanced directives.   Values and goals of care important to patient and family were attempted to be elicited.  Concept of Palliative Care was discussed  Family encouraged to call with questions or concerns.  PMT will continue to support holistically.       NEXT OF KIN/ husband/ no documented AD    SUMMARY OF RECOMMENDATIONS    Code Status/Advance Care Planning:  DNR will discussed with patient in the morning hopefull she will be more engaged  Symptoms  Depression- medication adjustments by psychiatry  I spoke to [place] today regarding the power of  positive thinking.  We discussed what facets of life brings her joy.  Her 71-year-old grandson is that glimmer of joy.   She tells me she will try throughout the next 24 hours to think positive healing thoughts when she thinks about her grandson.  Palliative Prophylaxis:   Aspiration, Bowel Regimen, Frequent Pain Assessment and Oral Care  Additional Recommendations (Limitations, Scope, Preferences):  Full Scope Treatment    Prognosis:   Unable to determine  Discharge Planning: To Be Determined      Primary Diagnoses: Present on Admission: . Sepsis ([place]) . Hypotension . GERD (gastroesophageal reflux disease) . Benign essential HTN . AKI  (acute kidney injury) ([place]) . Dehydration . AAA (abdominal aortic aneurysm) ([place]) . Diabetes mellitus type 2 in obese ([place]) . Hyponatremia . Leukocytosis . Anxiety state . Anemia . Thrombocytopenia ([place]) . Abnormal computed tomography angiography (CTA) of abdomen and pelvis . Colitis: Probable . Dissection of thoracoabdominal aorta (HCC)   I have reviewed the medical record, interviewed the patient and family, and examined the patient. The following aspects are pertinent.  Past Medical History:  Diagnosis Date  . Anxiety   . Arthritis   . Diabetes mellitus without complication ([place])   . Gastroparesis   . GERD (gastroesophageal reflux disease)   . Hypertension   . IBS (irritable bowel syndrome)    Social History   Social History  . Marital status: Married    Spouse name: N/A  . Number of children: N/A  . Years of education: N/A   Social History Main Topics  . Smoking status: Current Every Day Smoker    Packs/day: 0.75    Years: 23.00    Types: Cigarettes  . Smokeless tobacco: Never Used     Comment: 5-6 cigarettes. Cut back about 3 weeks ago. Smoking since age 52.  Marland Kitchen Alcohol use No  . Drug use: No  . Sexual activity: Not Asked   Other Topics Concern  . None   Social History Narrative  . None   Family History  Problem Relation Age of Onset  . Cancer Mother   . Stroke Mother   . Heart attack Mother   . Stroke Father    Scheduled Meds: . ALPRAZolam  0.25 mg Oral BID  . ARIPiprazole  5 mg Oral Daily  . aspirin EC  81 mg Oral Daily  . dronabinol  5 mg Oral BID AC  . DULoxetine  40 mg Oral Daily  . feeding supplement (ENSURE ENLIVE)  237 mL Oral TID BM  . feeding supplement (PRO-STAT SUGAR FREE 64)  30 mL Oral BID  . furosemide  40 mg Oral Daily  . insulin aspart  0-5 Units Subcutaneous QHS  . insulin aspart  0-9 Units Subcutaneous TID WC  . metoprolol tartrate  12.5 mg Oral BID  . pantoprazole (PROTONIX) IV  40 mg Intravenous Q24H  . phosphorus  500 mg  Oral TID  . potassium chloride  40 mEq Oral Daily  . sodium chloride flush  3 mL Intravenous Q12H   Continuous Infusions: . ceFEPime (MAXIPIME) IV Stopped (09/19/16 1752)  . fluconazole (DIFLUCAN) IV Stopped (09/20/16 1712)  . magnesium sulfate 1 - 4 g bolus IVPB    . metronidazole 500 mg (09/21/16 0847)   PRN Meds:.acetaminophen **OR** acetaminophen, alum & mag hydroxide-simeth, fluticasone, ipratropium-albuterol, lidocaine, loperamide, morphine injection, [DISCONTINUED] ondansetron **OR** ondansetron (ZOFRAN) IV, ondansetron, senna-docusate, sodium phosphate, sorbitol, traMADol Medications Prior to Admission:  Prior to Admission medications   Medication Sig Start Date  End Date Taking? Authorizing Provider  ALPRAZolam Duanne Moron) 0.5 MG tablet Take 0.5-1 tablets (0.25-0.5 mg total) by mouth 2 (two) times daily as needed for anxiety. 08/22/16  Yes Gold, Wayne E, PA-C  alum & mag hydroxide-simeth (MAALOX/MYLANTA) 200-200-20 MG/5ML suspension Take 30 mLs by mouth 2 (two) times daily as needed for indigestion or heartburn.   Yes [provider]  aspirin EC 81 MG EC tablet Take 1 tablet (81 mg total) by mouth daily. 08/22/16  Yes Gold, Wayne E, PA-C  citalopram (CELEXA) 40 MG tablet Take 40 mg by mouth at bedtime.    Yes [provider]  clonazePAM (KLONOPIN) 1 MG tablet Take 1 tablet (1 mg total) by mouth at bedtime. Patient taking differently: Take 1 mg by mouth at bedtime as needed (for sleep).  08/22/16  Yes Gold, Wayne E, PA-C  fluticasone (FLONASE) 50 MCG/ACT nasal spray Place 2 sprays into both nostrils daily as needed for allergies or rhinitis.  05/17/16  Yes [provider]  glipiZIDE-metformin (METAGLIP) 5-500 MG tablet Take 0.5 tablets by mouth 2 (two) times daily before a meal. 08/22/16  Yes Gold, Wayne E, PA-C  metoprolol tartrate (LOPRESSOR) 25 MG tablet Take 25 mg by mouth 2 (two) times daily.   Yes [provider]  mometasone-formoterol (DULERA) 200-5  MCG/ACT AERO Inhale 2 puffs into the lungs 2 (two) times daily. 08/22/16  Yes Gold, Wayne E, PA-C  ondansetron (ZOFRAN) 4 MG tablet Take 4 mg by mouth every 8 (eight) hours as needed for nausea or vomiting.   Yes [provider]  pantoprazole (PROTONIX) 40 MG tablet Take 40 mg by mouth daily as needed (acid reflux/ indigestion).  11/12/11  Yes Rehman, Mechele Dawley, MD  Probiotic Product (PROBIOTIC PO) Take 1 tablet by mouth at bedtime. Gut Restore Ultimate (probiotic with zinc and vitamin C)   Yes [provider]  traMADol (ULTRAM) 50 MG tablet Take 1 tablet (50 mg total) by mouth every 6 (six) hours as needed. Patient taking differently: Take 25 mg by mouth every 6 (six) hours as needed for moderate pain.  09/01/16  Yes Ivin Poot, MD  Metoprolol Tartrate 37.5 MG TABS Take 37.5 mg by mouth 2 (two) times daily. Patient not taking: Reported on 09/03/2016 08/22/16   Jadene Pierini E, PA-C  oxyCODONE 10 MG TABS Take 1 tablet (10 mg total) by mouth 2 (two) times daily as needed for severe pain. Patient not taking: Reported on 09/27/2016 08/22/16   John Giovanni, PA-C   Allergies  Allergen Reactions  . Penicillins Rash and Other (See Comments)    PATIENT HAS HAD A PCN REACTION WITH IMMEDIATE RASH, FACIAL/TONGUE/THROAT SWELLING, SOB, OR LIGHTHEADEDNESS WITH HYPOTENSION:  #  #  #  YES  #  #  #   Has patient had a PCN reaction causing severe rash involving mucus membranes or skin necrosis: no Has patient had a PCN reaction that required hospitalization no Has patient had a PCN reaction occurring within the last 10 years:no If all of the above answers are "NO", then may proceed with Cephalosporin use.  . Ativan [Lorazepam] Other (See Comments)    Makes pt very confused and more agitated, irritable.  . Codeine Nausea And Vomiting  . Reglan [Metoclopramide] Other (See Comments)    TACHYCARDIA   Review of Systems  Physical Exam  Constitutional: She appears well-developed.  Cardiovascular:  Normal rate, regular rhythm and normal heart sounds.   Pulmonary/Chest: She has decreased breath sounds.  Skin:  Skin is warm and dry.  Psychiatric: She exhibits a depressed mood.  -little interaction throughout conversation, flat affect    Vital Signs: BP (!) 145/80   Pulse 91   Temp 97.6 F (36.4 C) (Oral)   Resp 16   Ht 5\' 3"  (1.6 m)   Wt 79.5 kg (175 lb 4.3 oz)   SpO2 97%   BMI 31.05 kg/m  Pain Assessment: No/denies pain POSS *See Group Information*: 2-Acceptable,Slightly drowsy, easily aroused Pain Score: Asleep   SpO2: SpO2: 97 % O2 Device:SpO2: 97 % O2 Flow Rate: .   IO: Intake/output summary:  Intake/Output Summary (Last 24 hours) at 09/21/16 1107 Last data filed at 09/21/16 0847  Gross per 24 hour  Intake              600 ml  Output              700 ml  Net             -100 ml    LBM: Last BM Date: 09/19/16 Baseline Weight: Weight: 75.4 kg (166 lb 3.6 oz) Most recent weight: Weight: 79.5 kg (175 lb 4.3 oz)     Palliative Assessment/Data:     Time In: 1245 Time Out: 1345 Time Total: 60 min Greater than 50%  of this time was spent counseling and coordinating care related to the above assessment and plan.  Signed by: Wadie Lessen, NP   Please contact Palliative Medicine Team phone at [phone] for questions and concerns.  For individual provider: See Shea Evans

## 2016-09-22 LAB — PH, BODY FLUID: pH, Body Fluid: 8.1

## 2016-09-22 LAB — COMPREHENSIVE METABOLIC PANEL
ALBUMIN: 1.2 g/dL — AB (ref 3.5–5.0)
ALK PHOS: 428 U/L — AB (ref 38–126)
ALT: 11 U/L — ABNORMAL LOW (ref 14–54)
ANION GAP: 5 (ref 5–15)
AST: 17 U/L (ref 15–41)
BILIRUBIN TOTAL: 0.7 mg/dL (ref 0.3–1.2)
BUN: 27 mg/dL — AB (ref 6–20)
CALCIUM: 7 mg/dL — AB (ref 8.9–10.3)
CO2: 21 mmol/L — AB (ref 22–32)
Chloride: 110 mmol/L (ref 101–111)
Creatinine, Ser: 1.21 mg/dL — ABNORMAL HIGH (ref 0.44–1.00)
GFR calc Af Amer: 55 mL/min — ABNORMAL LOW (ref >60)
GFR calc non Af Amer: 48 mL/min — ABNORMAL LOW (ref >60)
GLUCOSE: 91 mg/dL (ref 65–99)
Potassium: 2.8 mmol/L — ABNORMAL LOW (ref 3.5–5.1)
SODIUM: 136 mmol/L (ref 135–145)
TOTAL PROTEIN: 4 g/dL — AB (ref 6.5–8.1)

## 2016-09-22 LAB — GLUCOSE, CAPILLARY
GLUCOSE-CAPILLARY: 94 mg/dL (ref 65–99)
GLUCOSE-CAPILLARY: 90 mg/dL (ref 65–99)
GLUCOSE-CAPILLARY: 99 mg/dL (ref 65–99)
GLUCOSE-CAPILLARY: 85 mg/dL (ref 65–99)

## 2016-09-22 LAB — MAGNESIUM: Magnesium: 2 mg/dL (ref 1.7–2.4)

## 2016-09-22 LAB — CBC WITH DIFFERENTIAL/PLATELET
BASOS ABS: 0 10*3/uL (ref 0.0–0.1)
BASOS PCT: 0 %
EOS ABS: 0 10*3/uL (ref 0.0–0.7)
Eosinophils Relative: 0 %
HEMATOCRIT: 24.6 % — AB (ref 36.0–46.0)
HEMOGLOBIN: 8.1 g/dL — AB (ref 12.0–15.0)
Lymphocytes Relative: 19 %
Lymphs Abs: 1 10*3/uL (ref 0.7–4.0)
MCH: 30.5 pg (ref 26.0–34.0)
MCHC: 32.9 g/dL (ref 30.0–36.0)
MCV: 92.5 fL (ref 78.0–100.0)
Monocytes Absolute: 0.4 10*3/uL (ref 0.1–1.0)
Monocytes Relative: 7 %
NEUTROS PCT: 74 %
Neutro Abs: 3.9 10*3/uL (ref 1.7–7.7)
Platelets: 73 10*3/uL — ABNORMAL LOW (ref 150–400)
RBC: 2.66 MIL/uL — AB (ref 3.87–5.11)
RDW: 20.4 % — ABNORMAL HIGH (ref 11.5–15.5)
WBC: 5.3 10*3/uL (ref 4.0–10.5)

## 2016-09-22 LAB — PHOSPHORUS: Phosphorus: 2.6 mg/dL (ref 2.5–4.6)

## 2016-09-22 MED ORDER — POTASSIUM CHLORIDE 10 MEQ/100ML IV SOLN
10.0000 meq | INTRAVENOUS | Status: AC
  Administered 2016-09-22 (×3): 10 meq via INTRAVENOUS
  Filled 2016-09-22 (×3): qty 100

## 2016-09-22 MED ORDER — PANTOPRAZOLE SODIUM 40 MG PO TBEC
40.0000 mg | DELAYED_RELEASE_TABLET | Freq: Every day | ORAL | Status: DC
  Administered 2016-09-22 – 2016-10-11 (×20): 40 mg via ORAL
  Filled 2016-09-22 (×20): qty 1

## 2016-09-22 MED ORDER — POTASSIUM CHLORIDE CRYS ER 20 MEQ PO TBCR: 40.0000 meq | EXTENDED_RELEASE_TABLET | Freq: Three times a day (TID) | ORAL | Status: DC

## 2016-09-22 MED ORDER — POTASSIUM CHLORIDE 20 MEQ/15ML (10%) PO SOLN
40.0000 meq | Freq: Three times a day (TID) | ORAL | Status: DC
  Administered 2016-09-22: 40 meq via ORAL
  Filled 2016-09-22 (×2): qty 30

## 2016-09-22 NOTE — Progress Notes (Signed)
PROGRESS NOTE    Haley Graham  OIB:[id number] DOB: [birth year] DOA: 09/15/2016 PCP: Glenda Chroman, MD   Brief Narrative: Haley Graham is a 61 y.o. 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 She presented with weakness and met sepsis criteria with concern for enteritis. Antibiotics started.   Patient has worsening Cr and Leukocytosis worsened and now improved. Complaining of Nausea and Abdominal Pain so will re-culture and repeat Imaging. Appears depressed and does not want to eat and just wants to lay in bed so will get Psych evaluation and place patient on Marinol. Discussed with Husband about Palliative Care Consult and he declined at this time. Patient refusing care and does not want to participate. Psych Evaluated and changed medications around. Patient also was found to have a Left Pleural Effusion and underwent Thoracentesis and also was transfused 1 unit of pRBCs on 09/18/16.   This AM she was found to have a Fungemia with 1/2 Blood Cx growing Yeast so Fluconazole was D/C'd and patient changed to Eraxis. Infectious Diseases was consulted and recommended getting repeat Imaging of Graft as she was high risk of Seeding. Sensitivities of Yeast revealed Candidemia so Eraxis was changed back to Fluconazole.  Cefepime and Metronidazole were discontinued and Repeat Blood Cx showed NGTD at 2 days Hours. Palliative Care met with the patient yesterday and today and Goals of Care to be discussed but patient not motivated and unwilling to participate.  It has been extremely hard to motivate the patient to do anything and per nurse the patient has been refusing medications and  Physical Therapy.   Assessment & Plan:   Principal Problem:   Candidemia ([place]) Active Problems:   GERD (gastroesophageal reflux disease)   Dissection of thoracoabdominal aorta (HCC)   AAA (abdominal aortic aneurysm) (HCC)   Benign essential HTN   Diabetes mellitus type 2 in obese (HCC)   Anxiety state   Hyponatremia   Leukocytosis   Sepsis (HCC)   Hypotension   AKI (acute kidney injury) ([place])   Dehydration   Generalized weakness   Anemia   Thrombocytopenia (HCC)   Abnormal computed tomography angiography (CTA) of abdomen and pelvis   Colitis: Probable   Protein-calorie malnutrition, severe   Palliative care by specialist   Adult failure to thrive   Hypophosphatemia   Hypomagnesemia  Sepsis, improving  -Suspected Secondary to colitis/enteritis but patient found to have a Bacteremia with Yeast and grew out Candida Albicans -WBC went from  26.9 -> 5.3 -Physiology improved with IV fluids and antibiotics. Has some tachycardia.  -GI pathogen panel and C. Diff negative.  -Initial Urine/blood culture negative. Repeat U/A shows evidence of UTI with Many bacteria, Large Hb, Trace Leukocytes, Positive Nitrites, TNTC RBC, TNTC WBC -IV Flagyl and Cefepime now Discontinued per ID Recc's as she has had adequate Treatment -Repeat CT Chest/Abd/Pelvis w/o Contrast done and showed Pleural Effusion -Continue to Monitor  -Yeast grew un UTI so will treat with Fluconazole; Fluconazole now D/C'd and started Eraxis as patient has a Fungemia; Eraxis now changed back to Fluconazole because of Blood Cx revealing Candida Albicans -Repeat Blood Cx showed NGTD at 2 days -Will need reimaging of Graft as she is at high risk of seeding and likely repeat Limited ECHO -Will likely also need Opthamology  Consult at some point for Dilated Opthalmologic exam and ID recommending within the next few weeks -Palliative Care Consulted and encouraged patient and will met to Discuss Code Status and patient refusing to  participate in Discussion as she has no motivation; Remains FULL CODE  Candidemia -As above -Repeat Blood Cx grew 1/2 Yeast and was Candida Albicans; Repeat Blood Cx showed no growth at 2 days -Continue Fluconazole per ID Recc's  -Will need reimaging of Graft as she is at high risk of seeding and likely repeat Limited ECHO -Will also need Opthalmologic Evaluation at some point  -Infectious Disease following and appreciated Recc's  Hypotension -Improved with IV fluids which has made her anasarca worse. Troponin negative. Albumin extremely low. -Gave 1x dose of IV Albumin on 09/19/16 -monitor BP  Dehydration/Poor oral intake/FTT -encourage oral intake -dietitian consulted: protein supplementation as patient has Significant Hypoalbuminemia and LE swelling  -Will add Marinol to help appetite Stimulant; Increased to 5 mg po BID  -Albumin was 1.2  -Nutritionist states she has no other recommendations as it is very difficult to motivate the patient to even take po as she has been refusing   Normocytic Anemia -Hb/Hct stable at 8.1/24.6 -Type and Screen and Transfused 1 unit of pRBC on 8/17 -Has some Hematuria and dark urine -Repeat CBC in AM  COPD Stable. -continue nebulizers  Acute Kidney Injury -Suspected Secondary to hypotension and Hx of Atrophic Left Kidney -Increased Diurectics to 40 mg daily and given IV Albumin today -BUN/Cr went from 16/1.12 -> 18/1.33 -> 20/1.38 -> 25/1.50 -> 27/1.40 -> 32/1.27 -> 30/1.31 -> 27/1.21 -Continue to Monitor and Avoid Nephrotoxics -Repeat CMP in AM   Anasarca -Secondary to IV fluids. Recent echo significant for grade 1 diastolic dysfunction. Repeat echocardiogram significant for an EF of 65-70% with grade 1 diastolic dysfunction and trace mitral regurg and tricuspid regurg -Lasix was increased to 40 mg daily; Given IV Albumin on 09/20/16 -Albumin was 1.2 -CT scan showed Small amount of abdominal and pelvic ascites. Diffuse body wall edema  consistent with anasarca  Oliguria -Improvement with diuresis -strict in/out -Patient +2.573 Liters since Admit  Yeast UTI ->100,000 CFU's -Treated With IV Fluconazole but now changed to Eraxis now that patient has a Fungemia; Changed back to IV Fluconazole now that Cx grew out Candida Albicans  -Treatment per ID Recc's  Hematuria -Tea colored urine. Decreased urine output initially which is now improved. Liver unremarkable for acute process. Vascular surgery without recommendations for thrombosis on consultation. -Monitor and repeated U/A today  -CT Scan showed Negative for nephrolithiasis, hydronephrosis or ureteral stone -May need to discuss with Urology -Foley Catheter discontinued; Continue to Monitor UOP and Strict I's and O's -Will not place foley back or do In-and-Out Cath  Hyponatremia -Went from 130 -> 133 Likely secondary to third spacing. -Continue to Monitor and repeat CMP in AM   Elevated alkaline phosphatase S/p cholecystecomy. GGT elevated. Alkaline phosphatase woresned from 345 -> 428. -RUQ unremarkable except for fatty liver -Continue to Monitor   Initial Abnormal CT angiogram of chest/abdomen/pelvis -Thrombus of left renal artery. Vascular surgery consulted with no recommendations for acute management. -Repeat CT Scan showed Displaced wall calcification in the infrarenal abdominal aorta consistent with known thrombosed dissection.  Thrombocytopenia -Presumed secondary to acute illness.  -Platelet Count now 63 -Dr. Lonny Prude had discussed with hematologist, Dr. Julien Nordmann who recommends continued surveillance.  -Can follow-up as an outpatient if not improved by discharge. -Pathology smear review showed Leukocytosis with predominance of neutrophils with toxic  features. Rare circulating myelocyte.  -Repat CBC in AM   GERD Stable. -continue PPI  Depression/Anxiety -Discontinued Klonipin -Consulted Psychiatry Dr. Louretta Shorten and appreciated  Assistance -Psychiatry changed Xanax 0.25 mg BID and discontinued Celexa; Patient was started on Cymbalta 40 mg po Daily for Depression  -C/w Marinol for Appetite  -Very difficult to Motivate and does not want to participate in Care  [place] type B aortic dissection -Patient is s/p stent graft placement on previous admission. -CT showed stable non contrasted appearance of the stent graft at the aortic arch and proximal descending thoracic aorta -Continue metoprolol at reduce dose  Tachycardia -Rebound secondary to abrupt cessation of beta-blocker. -continue metoprolol at reduced dose of 12.53m BID  Leukocytosis -Patient without new source of infection but will reculture. Afebrile.  -WBC went from 26.9 -> 5.3 -Inital Blood cultures negative and Repeat showed 1/2 Yeast -Pro calcitonin 0.24 on admission. -Repeat Imaging showed Bilateral pleural effusions, small on the right and moderate on the left. Worsening consolidation in the lower lobes may reflect atelectasis or pneumonia. -Patient Currently on Abx as above  Hypokalemia -K+ was 2.8 -Replete with Potassium Chloride 40 mEQ Liquid TIDand with KPhos Neutral 500 mg po TID daily; PER NURSE PATIENT HAS BEEN REFUSING -Will order 30 mEQ IV today -Continue to Monitor and Replete as Necessary -Repeat CMP in AM  Hypophosphatemia, improving -Patient's Phos Level was 2.6 -Replete with K Phos 500 mg po TID -Continue to Monitor and Replete as Necessary -Repeat Phos Level in AM   Hypomagnesemia -Patient's Mag Level was 2.0 -Replete with IV Mag Sulfate 2 grams yesterday -Continue to Monitor and Replete as Necessary -Repeat Mag Level in AM  Pleural Effusion on Left -U/S Guided Thoracentesis Ordered 09/19/17 -Resulted 0.65 mL of Fluid Removal -Repeat CXR this AM showed No change from previous day's study. 2. Persistent left lung base opacity consistent with combination atelectasis and a probable small effusion  DVT prophylaxis: SCDs  as patient's platelet count remain low Code Status: FULL CODE Family Communication: Discussed with Husband  Disposition Plan: Remain Inpatient and D/C to SNF when medically stable  Consultants:   Vascular Surgery  Case was discussed with Dr. MJulien Nordmann  Psychiatry   Palliative Care Medicine    Procedures:  ABI Left Thoracentesis    Antimicrobials:  Anti-infectives    Start     Dose/Rate Route Frequency Ordered Stop   09/21/16 1400  fluconazole (DIFLUCAN) IVPB 400 mg  Status:  Discontinued     400 mg 100 mL/hr over 120 Minutes Intravenous Every 24 hours 09/20/16 1220 09/20/16 1221   09/21/16 0000  anidulafungin (ERAXIS) 50 mg in sodium chloride 0.9 % 50 mL IVPB  Status:  Discontinued     50 mg 78 mL/hr over 50 Minutes Intravenous Every 24 hours 09/19/16 0345 09/19/16 0346   09/20/16 1400  fluconazole (DIFLUCAN) IVPB 800 mg  Status:  Discontinued     800 mg 200 mL/hr over 120 Minutes Intravenous  Once 09/20/16 1220 09/20/16 1221   09/20/16 1400  fluconazole (DIFLUCAN) IVPB 400 mg     400 mg 100 mL/hr over 120 Minutes Intravenous Every 24 hours 09/20/16 1221     09/19/16 2200  anidulafungin (ERAXIS) 100 mg in sodium chloride 0.9 % 100 mL IVPB  Status:  Discontinued     100 mg 78 mL/hr over 100 Minutes Intravenous Every 24 hours 09/19/16 0345 09/20/16 1220   09/19/16 0400  anidulafungin (ERAXIS) 200 mg in sodium chloride 0.9 % 200 mL IVPB  200 mg 78 mL/hr over 200 Minutes Intravenous  Once 09/19/16 0345 09/19/16 0819   09/18/16 1800  fluconazole (DIFLUCAN) IVPB 100 mg  Status:  Discontinued     100 mg 50 mL/hr over 60 Minutes Intravenous Every 24 hours 09/18/16 1109 09/19/16 1937   09/17/16 1800  ceFEPIme (MAXIPIME) 2 g in dextrose 5 % 50 mL IVPB  Status:  Discontinued     2 g 100 mL/hr over 30 Minutes Intravenous Every 24 hours 09/17/16 1243 09/21/16 1200   09/17/16 1800  fluconazole (DIFLUCAN) IVPB 200 mg  Status:  Discontinued     200 mg 100 mL/hr over 60 Minutes  Intravenous Every 24 hours 09/17/16 1651 09/18/16 1109   09/12/16 1400  ceFEPIme (MAXIPIME) 1 g in dextrose 5 % 50 mL IVPB  Status:  Discontinued     1 g 100 mL/hr over 30 Minutes Intravenous Every 8 hours 09/12/16 1127 09/17/16 1243   09/11/16 1600  metroNIDAZOLE (FLAGYL) IVPB 500 mg  Status:  Discontinued     500 mg 100 mL/hr over 60 Minutes Intravenous Every 8 hours 09/11/16 0902 09/21/16 1200   09/11/16 1400  ceFEPIme (MAXIPIME) 1 g in dextrose 5 % 50 mL IVPB  Status:  Discontinued     1 g 100 mL/hr over 30 Minutes Intravenous Every 24 hours 09/11/16 1234 09/12/16 1127   09/10/16 2000  vancomycin (VANCOCIN) IVPB 1000 mg/200 mL premix  Status:  Discontinued     1,000 mg 200 mL/hr over 60 Minutes Intravenous Every 24 hours 09/08/2016 2130 09/10/16 1616   09/10/16 1800  vancomycin (VANCOCIN) IVPB 750 mg/150 ml premix  Status:  Discontinued     750 mg 150 mL/hr over 60 Minutes Intravenous Every 12 hours 09/10/16 1616 09/12/16 1145   09/10/16 1400  levofloxacin (LEVAQUIN) IVPB 500 mg  Status:  Discontinued     500 mg 100 mL/hr over 60 Minutes Intravenous Every 24 hours 09/21/2016 2112 09/11/16 1220   09/10/16 0115  vancomycin (VANCOCIN) IVPB 1000 mg/200 mL premix  Status:  Discontinued     1,000 mg 200 mL/hr over 60 Minutes Intravenous  Once 09/10/16 0110 09/10/16 0118   10/01/2016 2200  metroNIDAZOLE (FLAGYL) IVPB 500 mg  Status:  Discontinued     500 mg 100 mL/hr over 60 Minutes Intravenous Every 6 hours 09/23/2016 2110 09/11/16 0902   09/04/2016 2115  vancomycin (VANCOCIN) IVPB 1000 mg/200 mL premix     1,000 mg 200 mL/hr over 60 Minutes Intravenous  Once 09/23/2016 2108 09/23/2016 2323   09/02/2016 1430  levofloxacin (LEVAQUIN) IVPB 750 mg     750 mg 100 mL/hr over 90 Minutes Intravenous  Once 09/18/2016 1415 09/27/2016 2152   09/18/2016 1430  metroNIDAZOLE (FLAGYL) IVPB 500 mg     500 mg 100 mL/hr over 60 Minutes Intravenous  Once 09/21/2016 1415 09/12/2016 1850   09/12/2016 1430  levofloxacin (LEVAQUIN)  IVPB 750 mg  Status:  Discontinued     750 mg 100 mL/hr over 90 Minutes Intravenous  Once 09/25/2016 1416 09/10/2016 1416     Subjective: Seen and examined at bedside and was laying in bed and not motivated and per nurse has been refusing care. Very difficult to get patient to try. Appears very depressed. Complained of Nausea again and wanted in and out catheter because she did not want to get up to urinate. Per nurse patient refused Potassium replacement as well as PT evaluation. Did not participate in Palliative Care discussion.   Objective: Vitals:   09/21/16  1417 09/21/16 2015 09/22/16 0510 09/22/16 1444  BP: 125/66 116/65 128/72 116/72  Pulse: 84 90 94 87  Resp: 18   17  Temp: 98.1 F (36.7 C) 97.6 F (36.4 C) 98 F (36.7 C) (!) 97.4 F (36.3 C)  TempSrc: Oral Oral Oral Oral  SpO2: 98% 99% 97% 99%  Weight:   79.1 kg (174 lb 4.8 oz)   Height:        Intake/Output Summary (Last 24 hours) at 09/22/16 1557 Last data filed at 09/22/16 0600  Gross per 24 hour  Intake                0 ml  Output              500 ml  Net             -500 ml   Filed Weights   09/20/16 0449 09/21/16 0643 09/22/16 0510  Weight: 80.4 kg (177 lb 4 oz) 79.5 kg (175 lb 4.3 oz) 79.1 kg (174 lb 4.8 oz)   Examination: Physical Exam:  Constitutional: Severely depressed obese Caucasian female in NAD stating she is nauseous and weak; Has no motivation even after multidisciplinary encouragement.  Eyes: Sclerae anicteric; Conjunctivae non-injected ENMT: Grossly normal hearing. External ears and nose appear normal Neck: No visible cervical lymphadenopathy or JVD Respiratory: Diminished to auscultation. No appreciable wheezing/rales but had mild crackles Cardiovascular: RRR; 2-3+ LE Pitting Edema Abdomen: Soft, Mildly tender to palpate. Distended due to Body Habitus. Bowel sounds present GU: Deferred Musculoskeletal: No contractures; No cyanosis Skin: Wet from anasarca and weeping. Right LE wound covered. Has  2-3+ Pitting edema Neurologic: CN 2-12 grossly intact. No appreciable focal deficits but patient has weakness and does not provide any effort Psychiatric: Severely depressed mood and flat affect. Awake and alert.  Data Reviewed: I have personally reviewed following labs and imaging studies  CBC:  Recent Labs Lab 09/17/16 0337 09/18/16 0255 09/19/16 0403 09/20/16 2007 09/21/16 0357 09/22/16 0433  WBC 19.9* 14.3* 14.8* 9.5 8.0 5.3  NEUTROABS 17.0* 11.6* 12.6*  --  5.4 3.9  HGB 8.9* 7.3* 9.4* 8.9* 8.2* 8.1*  HCT 26.6* 20.6* 28.7* 26.5* 23.7* 24.6*  MCV 95.7 98.1 92.3 92.7 95.6 92.5  PLT 103* 57* 64* 72* 72* 73*   Basic Metabolic Panel:  Recent Labs Lab 09/18/16 0255 09/19/16 0403 09/20/16 2214 09/21/16 0357 09/22/16 0433  NA 130* 130* 133* 133* 136  K 3.9 3.0* 3.9 3.3* 2.8*  CL 107 106 111 112* 110  CO2 17* 19* 15* 18* 21*  GLUCOSE 84 103* 105* 97 91  BUN 25* 27* 32* 30* 27*  CREATININE 1.50* 1.40* 1.37* 1.31* 1.21*  CALCIUM 6.9* 7.0* 7.2* 7.0* 7.0*  MG 1.8 1.9 1.8 1.6* 2.0  PHOS 3.8 3.5 2.6 2.4* 2.6   GFR: Estimated Creatinine Clearance: 49.3 mL/min (A) (by C-G formula based on SCr of 1.21 mg/dL (H)). Liver Function Tests:  Recent Labs Lab 09/18/16 0255 09/19/16 0403 09/20/16 2214 09/21/16 0357 09/22/16 0433  AST '19 19 24 19 17  ' ALT 10* 10* 10* 9* 11*  ALKPHOS 249* 292* 345* 353* 428*  BILITOT 0.9 0.9 0.9 0.7 0.7  PROT 3.7* 4.1* 3.9* 4.1* 4.0*  ALBUMIN <1.0* 1.0* 1.2* 1.2* 1.2*   No results for input(s): LIPASE, AMYLASE in the last 168 hours. No results for input(s): AMMONIA in the last 168 hours. Coagulation Profile: No results for input(s): INR, PROTIME in the last 168 hours. Cardiac Enzymes: No results for input(s):  CKTOTAL, CKMB, CKMBINDEX, TROPONINI in the last 168 hours. BNP (last 3 results) No results for input(s): PROBNP in the last 8760 hours. HbA1C: No results for input(s): HGBA1C in the last 72 hours. CBG:  Recent Labs Lab  09/21/16 1237 09/21/16 1657 09/21/16 2012 09/22/16 0656 09/22/16 1115  GLUCAP 115* 105* 83 94 90   Lipid Profile: No results for input(s): CHOL, HDL, LDLCALC, TRIG, CHOLHDL, LDLDIRECT in the last 72 hours. Thyroid Function Tests: No results for input(s): TSH, T4TOTAL, FREET4, T3FREE, THYROIDAB in the last 72 hours. Anemia Panel: No results for input(s): VITAMINB12, FOLATE, FERRITIN, TIBC, IRON, RETICCTPCT in the last 72 hours. Sepsis Labs: No results for input(s): PROCALCITON, LATICACIDVEN in the last 168 hours.  Recent Results (from the past 240 hour(s))  Culture, Urine     Status: Abnormal   Collection Time: 09/16/16 10:49 AM  Result Value Ref Range Status   Specimen Description URINE, CLEAN CATCH  Final   Special Requests NONE  Final   Culture >=100,000 COLONIES/mL YEAST (A)  Final   Report Status 09/17/2016 FINAL  Final  Culture, blood (routine x 2)     Status: None   Collection Time: 09/16/16  9:34 PM  Result Value Ref Range Status   Specimen Description BLOOD RIGHT HAND  Final   Special Requests IN PEDIATRIC BOTTLE Blood Culture adequate volume  Final   Culture NO GROWTH 5 DAYS  Final   Report Status 09/21/2016 FINAL  Final  Culture, blood (routine x 2)     Status: Abnormal   Collection Time: 09/16/16  9:34 PM  Result Value Ref Range Status   Specimen Description BLOOD RIGHT HAND  Final   Special Requests IN PEDIATRIC BOTTLE Blood Culture adequate volume  Final   Culture  Setup Time   Final    YEAST IN PEDIATRIC BOTTLE CRITICAL RESULT CALLED TO, READ BACK BY AND VERIFIED WITH: G.ABBOTT, PHARMD 09/19/16 0330 L.CHAMPION    Culture CANDIDA ALBICANS (A)  Final   Report Status 09/20/2016 FINAL  Final  Gram stain     Status: None   Collection Time: 09/18/16 12:14 PM  Result Value Ref Range Status   Specimen Description PLEURAL LEFT  Final   Special Requests NONE  Final   Gram Stain   Final    MODERATE WBC PRESENT, PREDOMINANTLY PMN NO ORGANISMS SEEN    Report  Status 09/18/2016 FINAL  Final  Culture, body fluid-bottle     Status: None (Preliminary result)   Collection Time: 09/18/16 12:14 PM  Result Value Ref Range Status   Specimen Description PLEURAL LEFT  Final   Special Requests NONE  Final   Culture NO GROWTH 4 DAYS  Final   Report Status PENDING  Incomplete  Culture, blood (Routine X 2) w Reflex to ID Panel     Status: None (Preliminary result)   Collection Time: 09/20/16  3:01 PM  Result Value Ref Range Status   Specimen Description BLOOD BLOOD RIGHT HAND  Final   Special Requests IN PEDIATRIC BOTTLE Blood Culture adequate volume  Final   Culture NO GROWTH 2 DAYS  Final   Report Status PENDING  Incomplete    Radiology Studies: No results found. Scheduled Meds: . ALPRAZolam  0.25 mg Oral BID  . ARIPiprazole  5 mg Oral Daily  . aspirin EC  81 mg Oral Daily  . dronabinol  5 mg Oral BID AC  . DULoxetine  40 mg Oral Daily  . feeding supplement (ENSURE ENLIVE)  237 mL  Oral TID BM  . feeding supplement (PRO-STAT SUGAR FREE 64)  30 mL Oral BID  . furosemide  40 mg Oral Daily  . insulin aspart  0-5 Units Subcutaneous QHS  . insulin aspart  0-9 Units Subcutaneous TID WC  . metoprolol tartrate  12.5 mg Oral BID  . pantoprazole  40 mg Oral QHS  . phosphorus  500 mg Oral TID  . potassium chloride  40 mEq Oral TID  . sodium chloride flush  3 mL Intravenous Q12H   Continuous Infusions: . fluconazole (DIFLUCAN) IV 400 mg (09/22/16 1440)    LOS: 13 days   Kerney Elbe, DO [facility] Pager [phone]  If 7PM-7AM, please contact night-coverage [email or web address] Password [facility] 09/22/2016, 3:57 PM

## 2016-09-22 NOTE — Progress Notes (Signed)
Nutrition Consult/Follow Up  DOCUMENTATION CODES:   Severe malnutrition in context of chronic illness  INTERVENTION:    Continue Ensure Enlive po TID, each supplement provides 350 kcal and 20 grams of protein   Continue Prostat liquid protein po 30 ml BID with meals, each supplement provides 100 kcal, 15 grams protein   Consider post pyloric Cortrak feeding tube placement for nutrition support  NUTRITION DIAGNOSIS:   Malnutrition (severe) related to chronic illness (failure to thrive post repair aortic dissection) as evidenced by energy intake < or equal to 50% for > or equal to 1 month, percent weight loss (8% x 3 months), ongoing  GOAL:   Patient will meet greater than or equal to 90% of their needs, progressing  MONITOR:   PO intake, Supplement acceptance, Labs, Weight trends, Skin, I & O's  ASSESSMENT:    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 She presented with weakness and met sepsis criteria with concern for enteritis. Antibiotics started. Cultures pending.  PT working with pt upon visit today. Did not disturb. Pt continues on a Soft diet. PO intake 0-50% per flowsheets. Per RN, pt eats meals and takes supplements better when husband is present. Medications reviewed. Started on Marinol BID. Labs reviewed. K 2.8 (L). CBG's 83-94-90.  Diet Order:  DIET SOFT Room service appropriate? Yes; Fluid consistency: Thin  Skin:   Reviewed, no issues  Last BM:  8/20  Height:   Ht Readings from Last 1 Encounters:  09/10/16 '5\' 3"'  (1.6 m)   Weight:   Wt Readings from Last 1 Encounters:  09/22/16 174 lb 4.8 oz (79.1 kg)   Wt Readings from Last 10  Encounters:  09/22/16 174 lb 4.8 oz (79.1 kg)  09/19/2016 159 lb (72.1 kg)  08/21/16 159 lb 9.8 oz (72.4 kg)  06/10/16 181 lb (82.1 kg)  05/27/16 186 lb 6.4 oz (84.6 kg)  03/28/14 173 lb 12.8 oz (78.8 kg)  02/14/14 170 lb (77.1 kg)  12/19/11 150 lb (68 kg)  11/19/11 161 lb 8 oz (73.3 kg)  11/12/11 164 lb (74.4 kg)   Ideal Body Weight:  52.2 kg  BMI:  Body mass index is 30.88 kg/m.  Estimated Nutritional Needs:   Kcal:  2000-2200  Protein:  110-120 gm  Fluid:  2.0-2.2 L  EDUCATION NEEDS:   No education needs identified at this time  Arthur Holms, RD, LDN Pager #: [phone] After-Hours Pager #: [phone]

## 2016-09-22 NOTE — Progress Notes (Signed)
Physical Therapy Treatment Patient Details Name: Haley Graham MRN: [id number] DOB: 16-Jul-1955 Today's Date: 09/22/2016    History of Present Illness This 61 y.o. female admitted with sepsis, hypotension, dehydration, amemia, COPD, AKI due to poor oral intake and hypotension/dehydration.  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 Pt required +2 for stand pivot transfers. Continue to progress as tolerated.   Follow Up Recommendations  SNF     Equipment Recommendations  Other (comment) (TBD)    Recommendations for Other Services       Precautions / Restrictions Precautions Precautions: Fall Restrictions Weight Bearing Restrictions: No    Mobility  Bed Mobility Overal bed mobility: Needs Assistance Bed Mobility: Supine to Sit;Sit to Sidelying;Rolling Rolling: Min assist   Supine to sit: Max assist   Sit to sidelying: Mod assist;+2 for physical assistance General bed mobility comments: cues for sequencing and technique; assist to mobilize bilat LE and elevate trunk into sitting and then to bring bilat LE into bed  Transfers Overall transfer level: Needs assistance Equipment used: 2 person hand held assist Transfers: Sit to/from Stand;Stand Pivot Transfers Sit to Stand: Mod assist;+2 physical assistance Stand pivot transfers: Mod assist;+2 physical assistance       General transfer comment: assist to power up into standing with bilat knees blocked from EOB and BSC; assist to maintain trunk extension and for balance when pivoting from BSC to EOB as pt began sitting prematurely despite cues  Ambulation/Gait                 Stairs            Wheelchair Mobility    Modified Rankin (Stroke Patients Only)        Balance   Sitting-balance support: Bilateral upper extremity supported;Feet supported Sitting balance-Leahy Scale: Fair                                      Cognition Arousal/Alertness: Awake/alert Behavior During Therapy: Flat affect Overall Cognitive Status: Impaired/Different from baseline Area of Impairment: Safety/judgement;Problem solving;Attention;Following commands                   Current Attention Level: Selective   Following Commands: Follows one step commands with increased time Safety/Judgement: Decreased awareness of safety;Decreased awareness of deficits Awareness: Intellectual Problem Solving: Slow processing;Decreased initiation;Requires verbal cues;Requires tactile cues;Difficulty sequencing        Exercises      General Comments General comments (skin integrity, edema, etc.): husband present end of session; edematous bilat feet       Pertinent Vitals/Pain Pain Assessment: Faces Faces Pain Scale: Hurts a little bit Pain Location: abdomen with transitional movements Pain Descriptors / Indicators: Grimacing Pain Intervention(s): Monitored during session;Repositioned    Home Living                      Prior Function            PT Goals (current goals can now be found in the care plan section) Acute Rehab PT Goals Patient Stated Goal: None stated Progress towards PT goals: Not progressing toward goals - comment    Frequency  Min 3X/week      PT Plan Current plan remains appropriate    Co-evaluation PT/OT/SLP Co-Evaluation/Treatment: Yes Reason for Co-Treatment: Necessary to address cognition/behavior during functional activity;For patient/therapist safety;To address functional/ADL transfers PT goals addressed during session: Mobility/safety with mobility        AM-PAC PT "6 Clicks" Daily Activity  Outcome Measure  Difficulty turning over in bed (including adjusting bedclothes, sheets and blankets)?:  Unable Difficulty moving from lying on back to sitting on the side of the bed? : Unable Difficulty sitting down on and standing up from a chair with arms (e.g., wheelchair, bedside commode, etc,.)?: Unable Help needed moving to and from a bed to chair (including a wheelchair)?: A Lot Help needed walking in hospital room?: A Lot Help needed climbing 3-5 steps with a railing? : Total 6 Click Score: 8    End of Session Equipment Utilized During Treatment: Gait belt Activity Tolerance: Patient limited by fatigue Patient left: in bed;with call bell/phone within reach;with SCD's reapplied;with family/visitor present Nurse Communication: Mobility status PT Visit Diagnosis: Muscle weakness (generalized) (M62.81);Other abnormalities of gait and mobility (R26.89);Unsteadiness on feet (R26.81);Adult, failure to thrive (R62.7)     Time: 1337-1400 PT Time Calculation (min) (ACUTE ONLY): 23 min  Charges:  $Therapeutic Activity: 8-22 mins                    G Codes:       Earney Navy, PTA Pager: [phone]     Darliss Cheney 09/22/2016, 4:10 PM

## 2016-09-22 NOTE — Progress Notes (Signed)
Occupational Therapy Treatment Patient Details Name: ANTONIETA SLAVEN MRN: [id number] DOB: 1955-08-03 Today's Date: 09/22/2016    History of present illness This 61 y.o. female admitted with sepsis, hypotension, dehydration, amemia, COPD, AKI due to poor oral intake and hypotension/dehydration.  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  She continues with limited participation, and would not agree to further activity.  She is progressively getting weaker due to immobility and not at all progressing with therapies.  Will downgrade goals and frequency next visit.    Follow Up Recommendations  SNF;Other (comment)    Equipment Recommendations  None recommended by OT    Recommendations for Other Services      Precautions / Restrictions Precautions Precautions: Fall Restrictions Weight Bearing Restrictions: No       Mobility Bed Mobility Overal bed mobility: Needs Assistance Bed Mobility: Supine to Sit;Sit to Sidelying;Rolling Rolling: Min assist   Supine to sit: Max assist   Sit to sidelying: Mod assist;+2 for physical assistance General bed mobility comments: cues for sequencing and technique; assist to mobilize bilat LE and elevate trunk into sitting and then to bring bilat LE into bed  Transfers Overall transfer level: Needs assistance Equipment used: 2 person hand held assist Transfers: Sit to/from Stand;Stand Pivot Transfers Sit to Stand: Mod assist;+2 physical assistance Stand pivot transfers: Mod assist;+2 physical assistance       General transfer comment: assist to power up into standing with bilat knees blocked from EOB and BSC; assist to maintain trunk extension and for balance when pivoting from BSC to EOB as pt began sitting prematurely despite cues    Balance Overall balance  assessment: Needs assistance Sitting-balance support: Bilateral upper extremity supported;Feet supported Sitting balance-Leahy Scale: Fair     Standing balance support: Bilateral upper extremity supported Standing balance-Leahy Scale: Poor                             ADL either performed or assessed with clinical judgement   ADL Overall ADL's : Needs assistance/impaired     Grooming: Wash/dry hands;Wash/dry face;Set up;Sitting Grooming Details (indicate cue type and reason): max encouragement to perform                  Toilet Transfer: Moderate assistance;+2 for physical assistance;Stand-pivot;BSC Toilet Transfer Details (indicate cue type and reason): requires assist to move into standing and assist to steady  Toileting- Clothing Manipulation and Hygiene: Total assistance;Sit to/from stand Toileting - Clothing Manipulation Details (indicate cue type and reason): Pt attempted to perform peri care with encouragement, but unable to access peri area, and quickly becomes frustrated and irritable      Functional mobility during ADLs: Moderate assistance;+2 for physical assistance       Vision       Perception     Praxis      Cognition Arousal/Alertness: Awake/alert Behavior During Therapy: Flat affect Overall Cognitive Status: Impaired/Different from baseline Area of Impairment: Safety/judgement;Problem solving;Attention;Following commands                   Current Attention Level: Selective   Following Commands: Follows one step commands with increased time Safety/Judgement: Decreased awareness of safety;Decreased awareness of deficits Awareness: Intellectual Problem Solving: Slow processing;Decreased initiation;Requires verbal cues;Requires tactile cues;Difficulty sequencing General  Comments: minimal interaction from pt         Exercises     Shoulder Instructions       General Comments husband present end of session; edematous bilat  feet     Pertinent Vitals/ Pain       Pain Assessment: Faces Faces Pain Scale: Hurts a little bit Pain Location: abdomen with transitional movements Pain Descriptors / Indicators: Grimacing Pain Intervention(s): Monitored during session  Home Living                                          Prior Functioning/Environment              Frequency  Min 2X/week        Progress Toward Goals  OT Goals(current goals can now be found in the care plan section)  Progress towards OT goals: Not progressing toward goals - comment (progressive weakness, and limited participation )  Acute Rehab OT Goals Patient Stated Goal: None stated  Plan Discharge plan remains appropriate    Co-evaluation    PT/OT/SLP Co-Evaluation/Treatment: Yes Reason for Co-Treatment: Complexity of the patient's impairments (multi-system involvement);For patient/therapist safety;To address functional/ADL transfers PT goals addressed during session: Mobility/safety with mobility OT goals addressed during session: ADL's and self-care      AM-PAC PT "6 Clicks" Daily Activity     Outcome Measure   Help from another person eating meals?: A Lot Help from another person taking care of personal grooming?: Total Help from another person toileting, which includes using toliet, bedpan, or urinal?: A Lot Help from another person bathing (including washing, rinsing, drying)?: Total Help from another person to put on and taking off regular upper body clothing?: Total Help from another person to put on and taking off regular lower body clothing?: Total 6 Click Score: 8    End of Session    OT Visit Diagnosis: Unsteadiness on feet (R26.81)   Activity Tolerance Patient limited by fatigue;Other (comment) (self limiting behaviors )   Patient Left in bed;with call bell/phone within reach;with family/visitor present   Nurse Communication Mobility status        Time: 3736-6815 OT Time  Calculation (min): 25 min  Charges: OT General Charges $OT Visit: 1 Procedure OT Treatments $Self Care/Home Management : 8-22 mins  [facility], OTR/L [phone]    Lucille Passy M 09/22/2016, 5:10 PM

## 2016-09-22 NOTE — Progress Notes (Signed)
Patient ID: Haley Graham, female   DOB: 12/31/1955, 61 y.o.   MRN: [id number]  This NP visited patient at the bedside as a follow up to  yesterday's [place].  Attempted to continue conversation with patient and her husband regarding current medical situation.  Values and goals of care important the patient and her family were attempted to be elicited.  Patient participated very little in today's discussion.  Her husband freely verbalized his concerns regarding the current situation. He feels that his wife has "just given up", but firmly believes it is deeply set in her depression.  He tells me that she has a history of depression and anxiety.  Over the last 8 years her main focus of joy in life with caring for her 50-year-old grandson. Also his wife verbalizes that she is "done with all this", he verbalizes he will not let that happen at this point in time.    He speaks to his by wife telling her that it is possible for her to improve and get back home again.  We discussed the importance of giving her anti-depressant medications time to impact her mood.  He speaks to his own emotional struggles with trying to motivate his wife into participating in activities of daily living and current treatment plan, in hopes of improvement  We discussed in detail the difference between a aggressive medical intervention path and a palliative comfort path for this patient at this time in this situation  I encouraged patient to participate in conversations enabling her to express her understanding of her situation and her decisions as it relates to her plan of care. Discussed with patient the importance of continued conversation with family and their  medical providers regarding overall plan of care and treatment options,  ensuring decisions are within the context of the patients values and GOCs.  Plan at this point is continue current medical interventions and hope for improvement.  No change in CODE  STATUS today  Questions and concerns addressed.  Palliative medicine team will continue to support holistically.  Time in   0930        Time out   1030      Total time spent on the unit was 60 minutes  Greater than 50% of the time was spent in counseling and coordination of care  Wadie Lessen NP  Palliative Medicine Team Team Phone # [phone] Pager [phone]

## 2016-09-23 LAB — GLUCOSE, CAPILLARY
Glucose-Capillary: 69 mg/dL (ref 65–99)
Glucose-Capillary: 176 mg/dL — ABNORMAL HIGH (ref 65–99)
Glucose-Capillary: 118 mg/dL — ABNORMAL HIGH (ref 65–99)
Glucose-Capillary: 117 mg/dL — ABNORMAL HIGH (ref 65–99)
Glucose-Capillary: 96 mg/dL (ref 65–99)

## 2016-09-23 LAB — CULTURE, BODY FLUID-BOTTLE: CULTURE: NO GROWTH

## 2016-09-23 LAB — CBC WITH DIFFERENTIAL/PLATELET
BASOS ABS: 0 10*3/uL (ref 0.0–0.1)
Basophils Relative: 0 %
EOS PCT: 1 %
Eosinophils Absolute: 0.1 10*3/uL (ref 0.0–0.7)
HEMATOCRIT: 24.8 % — AB (ref 36.0–46.0)
HEMOGLOBIN: 8.3 g/dL — AB (ref 12.0–15.0)
LYMPHS ABS: 1.2 10*3/uL (ref 0.7–4.0)
LYMPHS PCT: 24 %
MCH: 31.4 pg (ref 26.0–34.0)
MCHC: 33.5 g/dL (ref 30.0–36.0)
MCV: 93.9 fL (ref 78.0–100.0)
MONOS PCT: 8 %
Monocytes Absolute: 0.4 10*3/uL (ref 0.1–1.0)
NEUTROS ABS: 3.3 10*3/uL (ref 1.7–7.7)
Neutrophils Relative %: 67 %
Platelets: 72 10*3/uL — ABNORMAL LOW (ref 150–400)
RBC: 2.64 MIL/uL — ABNORMAL LOW (ref 3.87–5.11)
RDW: 21.7 % — AB (ref 11.5–15.5)
WBC: 5 10*3/uL (ref 4.0–10.5)

## 2016-09-23 LAB — COMPREHENSIVE METABOLIC PANEL
ALBUMIN: 1.2 g/dL — AB (ref 3.5–5.0)
ALK PHOS: 445 U/L — AB (ref 38–126)
ALT: 11 U/L — ABNORMAL LOW (ref 14–54)
AST: 19 U/L (ref 15–41)
Anion gap: 7 (ref 5–15)
BILIRUBIN TOTAL: 0.8 mg/dL (ref 0.3–1.2)
BUN: 22 mg/dL — AB (ref 6–20)
CHLORIDE: 109 mmol/L (ref 101–111)
CO2: 21 mmol/L — ABNORMAL LOW (ref 22–32)
Calcium: 7 mg/dL — ABNORMAL LOW (ref 8.9–10.3)
Creatinine, Ser: 1.07 mg/dL — ABNORMAL HIGH (ref 0.44–1.00)
GFR calc Af Amer: >60 mL/min (ref >60)
GFR calc non Af Amer: 55 mL/min — ABNORMAL LOW (ref >60)
Glucose, Bld: 74 mg/dL (ref 65–99)
POTASSIUM: 2.6 mmol/L — AB (ref 3.5–5.1)
Sodium: 137 mmol/L (ref 135–145)
Total Protein: 4.2 g/dL — ABNORMAL LOW (ref 6.5–8.1)

## 2016-09-23 LAB — BASIC METABOLIC PANEL
Anion gap: 6 (ref 5–15)
BUN: 21 mg/dL — AB (ref 6–20)
CALCIUM: 6.8 mg/dL — AB (ref 8.9–10.3)
CO2: 22 mmol/L (ref 22–32)
CREATININE: 1.04 mg/dL — AB (ref 0.44–1.00)
Chloride: 107 mmol/L (ref 101–111)
GFR calc Af Amer: >60 mL/min (ref >60)
GFR, EST NON AFRICAN AMERICAN: 57 mL/min — AB (ref >60)
GLUCOSE: 102 mg/dL — AB (ref 65–99)
Potassium: 3.5 mmol/L (ref 3.5–5.1)
Sodium: 135 mmol/L (ref 135–145)

## 2016-09-23 LAB — PHOSPHORUS: Phosphorus: 4.1 mg/dL (ref 2.5–4.6)

## 2016-09-23 LAB — MAGNESIUM: Magnesium: 1.5 mg/dL — ABNORMAL LOW (ref 1.7–2.4)

## 2016-09-23 LAB — CULTURE, BODY FLUID W GRAM STAIN -BOTTLE

## 2016-09-23 MED ORDER — POTASSIUM CHLORIDE 10 MEQ/100ML IV SOLN
10.0000 meq | INTRAVENOUS | Status: AC
  Administered 2016-09-23 (×2): 10 meq via INTRAVENOUS
  Filled 2016-09-23 (×2): qty 100

## 2016-09-23 MED ORDER — DEXTROSE 50 % IV SOLN
25.0000 mL | Freq: Once | INTRAVENOUS | Status: AC
  Administered 2016-09-23: 25 mL via INTRAVENOUS

## 2016-09-23 MED ORDER — POTASSIUM CHLORIDE 10 MEQ/100ML IV SOLN
10.0000 meq | INTRAVENOUS | Status: DC
  Administered 2016-09-23 (×3): 10 meq via INTRAVENOUS
  Filled 2016-09-23 (×3): qty 100

## 2016-09-23 MED ORDER — MAGNESIUM SULFATE 2 GM/50ML IV SOLN
2.0000 g | Freq: Once | INTRAVENOUS | Status: AC
  Administered 2016-09-23: 2 g via INTRAVENOUS
  Filled 2016-09-23: qty 50

## 2016-09-23 MED ORDER — POTASSIUM CHLORIDE CRYS ER 20 MEQ PO TBCR: 40.0000 meq | EXTENDED_RELEASE_TABLET | Freq: Three times a day (TID) | ORAL | Status: DC

## 2016-09-23 MED ORDER — POTASSIUM CHLORIDE CRYS ER 20 MEQ PO TBCR
40.0000 meq | EXTENDED_RELEASE_TABLET | ORAL | Status: AC
  Administered 2016-09-23 (×2): 40 meq via ORAL
  Filled 2016-09-23 (×2): qty 2

## 2016-09-23 MED ORDER — DEXTROSE 50 % IV SOLN
INTRAVENOUS | Status: AC
  Filled 2016-09-23: qty 50

## 2016-09-23 NOTE — Care Management Note (Addendum)
Case Management Note Previous CM note initiated by Carles Collet, RN 09/11/2016, 1:50 PM  Patient Details  Name: Haley Graham MRN: [id number] Date of Birth: November 22, 1955  Subjective/Objective:                 Patient discharged 7/21 to home w [facility] for [facility] services. Requested from [facility] if patient was still active, will update note as info becomes available.   Action/Plan:   Expected Discharge Date:   (unknown)               Expected Discharge Plan:  [place]  In-House Referral:     Discharge planning Services  CM Consult  Post Acute Care Choice:  Home Health, Resumption of Svcs/PTA Provider Choice offered to:  Patient, Adult Children  DME Arranged:    DME Agency:     HH Arranged:    [place] Agency:  [place]  Status of Service:  In process, will continue to follow  If discussed at Long Length of Stay Meetings, dates discussed: 8/21, 8/23    Discharge Disposition:   Additional Comments:  09/24/16- 0900- Marvetta Gibbons RN, CM- per QC mtg- pt will be HRI with [facility] if pt returns home and refuses SNF- also if pt gets FT can go home with [facility] services and TF management at home- CM to follow. - CM on 3E updated.   09/23/16- 1700- Marvetta Gibbons RN, CM- pt continues to be difficult to motivate- PC following, may need TF for nutritional support. Recommendations for SNF - CSW following - however in the end not sure pt and husband will agree to SNF vs return home with [facility] services. CM will continue to follow   09/14/16- 1025- Thaison Kolodziejski RN, CM- spoke with Santiago Glad at [facility]- pt is active with them for HHRN/PT- however received call from pt's daughter who wants to look at [facility]- will let CSW know - per PT eval recommendations for SNF- CSW will follow for placement needs.   Dawayne Patricia, RN 09/23/2016, 10:44 PM

## 2016-09-23 NOTE — Progress Notes (Signed)
Patient ID: Haley Graham, female   DOB: 12-Jun-1955, 61 y.o.   MRN: [id number]  This NP visited patient at the bedside as a follow up for palliative needs and emotional support.  Patient is out of bed and a bit more engaged today, husband at bedside.    Again we spoke to the importance of her participation in overall treatment  plan in order to maximize therapies and enhance opportunity for improvement.  Her husband continues to be positive and motivating for his wife.  He stays at her bedside 24/7.  He assists her with many activities of daily living.  Today we did discuss the possibility of short-term artificial feeding;  Per nutritional consult on 09/22/2016 consider postpyloric cord tract feeding tube placement for nutritional support.  Both patient and her husband are open to this intervention.  I discussed with Dr. Florene Glen her attending today and he will f/u with patient and family  Discussed with patient the importance of continued conversation with family and their  medical providers regarding overall plan of care and treatment options,  ensuring decisions are within the context of the patients values and GOCs.  Plan at this point is continue current medical interventions and hope for improvement.    Questions and concerns addressed.  Palliative medicine team will continue to support holistically.  I shared with patient and her husband that I would not be back in the hospital again until Monday and offered f/u from another PMT provider, they told me they would call with needs and otherwise they  Request to  wait for further  conversation until Monday  Discussed with Dr Cephus Slater  Time in   1015        Time out   1050      Total time spent on the unit was 35 minutes  Greater than 50% of the time was spent in counseling and coordination of care  Wadie Lessen NP  Palliative Medicine Team Team Phone # [phone] Pager [phone]

## 2016-09-23 NOTE — Progress Notes (Signed)
Patient ID: Haley Graham, female   DOB: 1956-01-18, 61 y.o.   MRN: [id number]          [facility]  Date of Admission:  09/08/2016                   Day 7 antifungal therapy ASSESSMENT: [name] recently developed transient candidemia. The source of her candidemia is unclear. Repeat blood cultures on 09/20/2016 are negative. Pleural fluid cultures are negative. There is no evidence of endocarditis by TTE. Given her recent aortic dissection and placement of a stent graft I would like to extend therapy with fluconazole for a total of 6 weeks.  PLAN: 1. Continue fluconazole 400 mg by mouth daily for 5 more weeks through 10/28/2016  2. Ideally she needs an ophthalmologic exam within the next month 3. I will sign off now  Principal Problem:   Candidemia ([place]) Active Problems:   GERD (gastroesophageal reflux disease)   Dissection of thoracoabdominal aorta (HCC)   AAA (abdominal aortic aneurysm) (HCC)   Benign essential HTN   Diabetes mellitus type 2 in obese (HCC)   Anxiety state   Hyponatremia   Leukocytosis   Sepsis (HCC)   Hypotension   AKI (acute kidney injury) ([place])   Dehydration   Generalized weakness   Anemia   Thrombocytopenia (HCC)   Abnormal computed tomography angiography (CTA) of abdomen and pelvis   Colitis: Probable   Protein-calorie malnutrition, severe   Palliative care by specialist   Adult failure to thrive   Hypophosphatemia   Hypomagnesemia    SUBJECTIVE: She states that she is tired.  Review of Systems: Review of Systems  Unable to perform ROS: Patient nonverbal    Allergies  Allergen Reactions  . Penicillins Rash and Other (See Comments)    PATIENT HAS HAD A PCN REACTION WITH IMMEDIATE RASH, FACIAL/TONGUE/THROAT SWELLING, SOB, OR LIGHTHEADEDNESS WITH HYPOTENSION:  #  #  #  YES  #  #  #   Has patient had a PCN reaction causing severe rash involving mucus membranes or skin necrosis: no Has patient had a PCN  reaction that required hospitalization no Has patient had a PCN reaction occurring within the last 10 years:no If all of the above answers are "NO", then may proceed with Cephalosporin use.  . Ativan [Lorazepam] Other (See Comments)    Makes pt very confused and more agitated, irritable.  . Codeine Nausea And Vomiting  . Reglan [Metoclopramide] Other (See Comments)    TACHYCARDIA    OBJECTIVE: Vitals:   09/23/16 0623 09/23/16 1058 09/23/16 1114 09/23/16 1230  BP:   (!) 120/53 120/67  Pulse:  95 99 81  Resp: 18   18  Temp: 97.6 F (36.4 C)  (!) 97.1 F (36.2 C) 97.9 F (36.6 C)  TempSrc: Oral  Axillary Oral  SpO2:    100%  Weight: 153 lb 14.4 oz (69.8 kg)     Height:       Body mass index is 27.26 kg/m.  Physical Exam  Constitutional:  She is resting quietly in bed.   Cardiovascular: Normal rate and regular rhythm.   No murmur heard. Pulmonary/Chest: Effort normal and breath sounds normal. She has no wheezes. She has no rales.  Abdominal: Soft. There is no tenderness.  Musculoskeletal: Normal range of motion. She exhibits no edema or tenderness.    Lab Results Lab Results  Component Value Date   WBC 5.0 09/23/2016   HGB 8.3 (L) 09/23/2016  HCT 24.8 (L) 09/23/2016   MCV 93.9 09/23/2016   PLT 72 (L) 09/23/2016    Lab Results  Component Value Date   CREATININE 1.07 (H) 09/23/2016   BUN 22 (H) 09/23/2016   NA 137 09/23/2016   K 2.6 (LL) 09/23/2016   CL 109 09/23/2016   CO2 21 (L) 09/23/2016    Lab Results  Component Value Date   ALT 11 (L) 09/23/2016   AST 19 09/23/2016   ALKPHOS 445 (H) 09/23/2016   BILITOT 0.8 09/23/2016     Microbiology: Recent Results (from the past 240 hour(s))  Culture, Urine     Status: Abnormal   Collection Time: 09/16/16 10:49 AM  Result Value Ref Range Status   Specimen Description URINE, CLEAN CATCH  Final   Special Requests NONE  Final   Culture >=100,000 COLONIES/mL YEAST (A)  Final   Report Status 09/17/2016 FINAL   Final  Culture, blood (routine x 2)     Status: None   Collection Time: 09/16/16  9:34 PM  Result Value Ref Range Status   Specimen Description BLOOD RIGHT HAND  Final   Special Requests IN PEDIATRIC BOTTLE Blood Culture adequate volume  Final   Culture NO GROWTH 5 DAYS  Final   Report Status 09/21/2016 FINAL  Final  Culture, blood (routine x 2)     Status: Abnormal   Collection Time: 09/16/16  9:34 PM  Result Value Ref Range Status   Specimen Description BLOOD RIGHT HAND  Final   Special Requests IN PEDIATRIC BOTTLE Blood Culture adequate volume  Final   Culture  Setup Time   Final    YEAST IN PEDIATRIC BOTTLE CRITICAL RESULT CALLED TO, READ BACK BY AND VERIFIED WITH: G.ABBOTT, PHARMD 09/19/16 0330 L.CHAMPION    Culture CANDIDA ALBICANS (A)  Final   Report Status 09/20/2016 FINAL  Final  Gram stain     Status: None   Collection Time: 09/18/16 12:14 PM  Result Value Ref Range Status   Specimen Description PLEURAL LEFT  Final   Special Requests NONE  Final   Gram Stain   Final    MODERATE WBC PRESENT, PREDOMINANTLY PMN NO ORGANISMS SEEN    Report Status 09/18/2016 FINAL  Final  Culture, body fluid-bottle     Status: None   Collection Time: 09/18/16 12:14 PM  Result Value Ref Range Status   Specimen Description PLEURAL LEFT  Final   Special Requests NONE  Final   Culture NO GROWTH 5 DAYS  Final   Report Status 09/23/2016 FINAL  Final  Culture, blood (Routine X 2) w Reflex to ID Panel     Status: None (Preliminary result)   Collection Time: 09/20/16  3:01 PM  Result Value Ref Range Status   Specimen Description BLOOD BLOOD RIGHT HAND  Final   Special Requests IN PEDIATRIC BOTTLE Blood Culture adequate volume  Final   Culture NO GROWTH 3 DAYS  Final   Report Status PENDING  Incomplete    Michel Bickers, MD [facility] [phone] pager   [phone] cell 09/23/2016, 1:03 PM

## 2016-09-23 NOTE — Progress Notes (Signed)
PROGRESS NOTE    Haley Graham  UTM:[id number] DOB: 1955/04/23 DOA: 09/10/2016 PCP: Glenda Chroman, MD (Confirm with patient/family/NH records and if not entered, this HAS to be entered at [facility] point of entry. 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 She presented with weakness and met sepsis criteria with concern for enteritis. Antibiotics started.   Patient had worsening Cr and Leukocytosis worsened and now improved. Complaining of Nausea and Abdominal Pain so will re-culture and repeat Imaging. Appears depressed and does not want to eat and just wants to lay in bed so will get Psych evaluation and place patient on Marinol. Discussed with Husband about Palliative Care Consult and he declined at this time. Patient refusing care and does not want to participate. Psych Evaluated and has adjusted medications. Patient also was found to have a Left Pleural Effusion and underwent Thoracentesis and also was transfused 1 unit of pRBCs on 09/18/16.   This AM she was found to have a Fungemia with 1/2 Blood Cx growing Yeast so Fluconazole was D/C'd and patient changed to Eraxis. Infectious Diseases was consulted and recommended getting repeat Imaging of Graft as she was high risk of Seeding. Sensitivities of Yeast revealed Candidemia so Eraxis was changed back to Fluconazole.  Cefepime and Metronidazole were discontinued and Repeat Blood Cx showed NGTD at 2 days. Palliative Care met with the patient yesterday and today and Goals of Care to be  discussed but patient not motivated and unwilling to participate.  It has been extremely hard to motivate the patient to do anything and per nurse the patient has been refusing medications and Physical Therapy.   Today plan for fluconazole to continue through 9/26 per ID.  She'll need an eye exam within 1 month.  Will need to discuss follow up imaging of graft and echo with ID tomorrow.  Considering corpak placement for poor PO intake.    Assessment & Plan:   Principal Problem:   Candidemia ([place]) Active Problems:   GERD (gastroesophageal reflux disease)   Dissection of thoracoabdominal aorta (HCC)   AAA (abdominal aortic aneurysm) (HCC)   Benign essential HTN   Diabetes mellitus type 2 in obese (HCC)   Anxiety state   Hyponatremia   Leukocytosis   Sepsis (HCC)   Hypotension   AKI (acute kidney injury) ([place])   Dehydration   Generalized weakness   Anemia   Thrombocytopenia (HCC)   Abnormal computed tomography angiography (CTA) of abdomen and pelvis   Colitis: Probable   Protein-calorie malnutrition, severe   Palliative care by specialist   Adult failure to thrive   Hypophosphatemia   Hypomagnesemia   Sepsis, improving  -Suspected Secondary to colitis/enteritis but patient found to have a Bacteremia with Yeast and grew out Candida Albicans urine from 8/15 notably also grew out candida albicans (09/16/16 - repeat blood culture from 8/19 NGTD) -WBC went from  26.9 -> 5.0 -Physiology improved with IV fluids and antibiotics. Has some tachycardia.  -GI pathogen panel and C. Diff negative.  -Initial Urine/bloodculture negative. Repeat U/A shows evidence of UTI with Many bacteria, Large Hb, Trace Leukocytes, Positive Nitrites, TNTC  RBC, TNTC WBC -IV Flagyl and Cefepime now Discontinued per ID Recc's as she has had adequate Treatment -Repeat CT Chest/Abd/Pelvis w/o Contrast done and showed Pleural Effusion -Continue to Monitor  -Yeast grew un UTI so will treat with Fluconazole;  Fluconazole now D/C'd and started Eraxis as patient has a Fungemia; Eraxis now changed back to Fluconazole because of Blood Cx revealing Candida Albicans - plan to continue through 9/26.  -Repeat Blood Cx showed NGTD at 2 days -Will need reimaging of Graft as she is at high risk of seeding and likely repeat Limited ECHO  '[ ]'$  discuss with ID timing '[ ]'$  Will likely also need Opthamology Consult at some point for Dilated Opthalmologic exam and ID recommending within the next few weeks -Palliative Care Consulted and encouraged patient and will met to Discuss Code Status and patient refusing to participate in Discussion as she has no motivation; Remains FULL CODE  Candidemia -As above -Repeat Blood Cx grew 1/2 Yeast and was Candida Albicans; Repeat Blood Cx showed no growth at 2 days -Continue Fluconazole per ID Recc's  -Will need reimaging of Graft as she is at high risk of seeding and likely repeat Limited ECHO -Will also need Opthalmologic Evaluation at some point  -Infectious Disease following and appreciated Recc's  Hypotension, improved -Improved with IV fluids which has made her anasarca worse. Troponin negative. Albumin extremely low. -Gave 1x dose of IV Albumin on 09/19/16 -monitor BP  Dehydration/Poor oral intake/FTT -encourage oral intake -dietitian consulted: protein supplementation as patient has Significant Hypoalbuminemia and LE swelling  -Will add Marinol to help appetite Stimulant; Increased to 5 mg po BID  -Albumin was 1.2  -Nutritionist states she has no other recommendations as it is very difficult to motivate the patient to even take po as she has been refusing  '[ ]'$  will try to get corpak tomorrow   Normocytic Anemia -Hb/Hct stable at 8.3/24.8 -Type and Screen and Transfused 1 unit of pRBC on 8/17 -Has some Hematuria and dark urine -Repeat CBC in AM  COPD Stable. -continue nebulizers  Acute Kidney Injury - imrpoved to 1.07, peaked at 1.5 -Suspected  Secondary to hypotension and Hx of Atrophic Left Kidney -Continue to Monitor and Avoid Nephrotoxics -Repeat CMP in AM   Anasarca -Secondary to IV fluids. Recent echo significant for grade 1 diastolic dysfunction.Repeat echocardiogram significant for an EF of 65-70% with grade 1 diastolic dysfunction and trace mitral regurg and tricuspid regurg -Lasix was increased to 40 mg daily; Given IV Albumin on 09/20/16 -Albumin was 1.2 -CT scan showed Small amount of abdominal and pelvic ascites. Diffuse body wall edema consistent with anasarca  Oliguria -Improvement with diuresis -strict in/out (most appear unmeasured)  -Patient +2.573 Liters since Admit  Yeast UTI ->100,000 CFU's -Treated With IV Fluconazole but now changed to Eraxis now that patient has a Fungemia; Changed back to IV Fluconazole now that Cx grew out Candida Albicans  -Treatment per ID Recc's (see above)  Hematuria -Tea colored urine. Decreased urine outputinitially which is now improved. Liver unremarkable for acute process. Vascular surgery without recommendations for thrombosis on consultation. -Monitor and repeated U/A today  -CT Scan showed Negative for nephrolithiasis, hydronephrosis or ureteral stone -May need to discuss with Urology -Foley Catheter discontinued; Continue to Monitor UOP and Strict I's and O's -Will not place foley back or do In-and-Out Cath  Hyponatremia -Went from 130 -> 133 Likely secondary to third spacing. -Continue to Monitor and repeat CMP in AM   Elevated alkaline phosphatase S/p  cholecystecomy. GGT elevated. Alkaline phosphatase woresned from 345 -> 428. -RUQ unremarkable except for fatty liver -Continue to Monitor   Initial Abnormal CT angiogram of chest/abdomen/pelvis -Thrombus of left renal artery. Vascular surgery consulted with no recommendations for acute management. -Repeat CT Scan showed Displaced wall calcification in the infrarenal abdominal aorta consistent with known  thrombosed dissection.  Thrombocytopenia -Presumed secondary to acute illness.  -Platelet Count now 24 -Dr. Lonny Prude had discussed with hematologist, Dr. Julien Nordmann who recommends continued surveillance.  -Can follow-up as an outpatient if not improved by discharge. -Pathology smear review showed Leukocytosis with predominance of neutrophils with toxic features. Rare circulating myelocyte.  -Repat CBC in AM   GERD Stable. -continue PPI  Depression/Anxiety -Discontinued Klonipin -Consulted Psychiatry Dr. Louretta Shorten and appreciated Assistance -Psychiatry changed Xanax 0.25 mg BID and discontinued Celexa; Patient was started on Cymbalta 40 mg po Daily for Depression  -C/w Marinol for Appetite  -Very difficult to Motivate and does not want to participate in Care  [place] type B aortic dissection -Patient is s/p stent graft placement on previous admission. -CT showed stable non contrasted appearance of the stent graft at the aortic arch and proximal descending thoracic aorta -Continue metoprolol at reduce dose  Tachycardia improved  irregular HR _0  This morning HR seemed irregular (possibly ectopy or sinus arrhythmia), will obtain EKG and place on Tele -Rebound secondary to abrupt cessation of beta-blocker. -continue metoprolol at reduced dose of 12.36m BID  Leukocytosis -improved  -WBC went from 26.9 -> 5.0 -Inital Blood cultures negative and Repeat showed 1/2 Yeast.  Repeat from 8/19 is NGTD.  -Pro calcitonin 0.24 on admission. -Repeat Imaging showed Bilateral pleural effusions, small on the right and moderate on the left. Worsening consolidation in the lower lobes may reflect atelectasis or pneumonia. -Patient Currently on fluconazole  Hypokalemia -K+ was 2.6 this morning.  - today received IV K 50 meq and 40 meq x2, also receiving k phos, but will d/c this with improvement _1  PM BMP -Continue to Monitor and Replete as Necessary -Repeat CMP in  AM  Hypophosphatemia, improving -4.1 today -d/c K phos will ctm -Continue to Monitor and Replete as Necessary -Repeat Phos Level in AM   Hypomagnesemia -IV mag sulfate today -Continue to Monitor and Replete as Necessary -Repeat Mag Level in AM  Pleural Effusion on Left -U/S Guided Thoracentesis Ordered 09/19/17 -Resulted 0.65 mL of Fluid Removal -Repeat CXR 8/18 showed No change from previous day's study. 2. Persistent left lung base opacity consistent with combination atelectasis and a probable small effusion   DVT prophylaxis: SCD's for low platelets Code Status: full  Family Communication: daughter and husband Disposition Plan: pending improvement in PO, functional status   Consultants:   ID, nutrition, psych, palliative care  Procedures: (Don't include imaging studies which can be auto populated. Include things that cannot be auto populated i.e. 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  Continued thrombosis of L renal artery with progressive atrophy of L kidney.  Fatty infiltration of liver.  Wall enhancement of several loos of small bowel and large bowe (colitis or enteritis).  Interval development of linear  filling defect in R external iliac atery (thrombus, dissection?) potentiall related to stent placement.   Antimicrobials: (specify start and planned stop date. Auto populated tables are space occupying and do not give end dates)  fluconazole    Subjective: Feels weak and nauseous.  No F, chills.  Some abdominal discomfort as well. She's using the bathroom with BM and +UOP.  Not getting out of bed.  She's quiet and her husband provides a lot of the history.  She's depressed.    Objective: Vitals:   09/22/16 1444 09/22/16 2010 09/23/16 0621 09/23/16 0623  BP: 116/72 114/61 (!) 115/56   Pulse: 87  94   Resp: _0 Temp: (!) 97.4 F (36.3 C) (!) 97.5 F (36.4 C)  97.6 F (36.4 C)  TempSrc: Oral Oral  Oral  SpO2: 99%     Weight:    69.8 kg (153 lb 14.4 oz)  Height:        Intake/Output Summary (Last 24 hours) at 09/23/16 7124 Last data filed at 09/22/16 1717  Gross per 24 hour  Intake              500 ml  Output                0 ml  Net              500 ml   Filed Weights   09/21/16 0643 09/22/16 0510 09/23/16 0623  Weight: 79.5 kg (175 lb 4.3 oz) 79.1 kg (174 lb 4.8 oz) 69.8 kg (153 lb 14.4 oz)    Examination:  General exam: Appears calm and comfortable  Respiratory system: Clear to auscultation. Respiratory effort normal. Cardiovascular system: S1 & S2 heard, irregular. No JVD, murmurs, rubs, gallops or clicks. Gastrointestinal system: Abdomen is nondistended, soft and nontender. No organomegaly or masses felt. Normal bowel sounds heard. Central nervous system: Alert and oriented. No focal neurological deficits. Extremities: 3+ LEE Skin: No rashes, lesions or ulcers Psychiatry: Judgement and insight appear normal. Mood & affect appropriate.     Data Reviewed: I have personally reviewed following labs and imaging studies  CBC:  Recent Labs Lab 09/18/16 0255 09/19/16 0403 09/20/16 2007 09/21/16 0357 09/22/16 0433 09/23/16 0315  WBC 14.3* 14.8* 9.5 8.0  5.3 5.0  NEUTROABS 11.6* 12.6*  --  5.4 3.9 3.3  HGB 7.3* 9.4* 8.9* 8.2* 8.1* 8.3*  HCT 20.6* 28.7* 26.5* 23.7* 24.6* 24.8*  MCV 98.1 92.3 92.7 95.6 92.5 93.9  PLT 57* 64* 72* 72* 73* 72*   Basic Metabolic Panel:  Recent Labs Lab 09/19/16 0403 09/20/16 2214 09/21/16 0357 09/22/16 0433 09/23/16 0315  NA 130* 133* 133* 136 137  K 3.0* 3.9 3.3* 2.8* 2.6*  CL 106 111 112* 110 109  CO2 19* 15* 18* 21* 21*  GLUCOSE 103* 105* 97 91 74  BUN 27* 32* 30* 27* 22*  CREATININE 1.40* 1.37* 1.31* 1.21* 1.07*  CALCIUM 7.0* 7.2* 7.0* 7.0* 7.0*  MG 1.9 1.8 1.6* 2.0 1.5*  PHOS 3.5 2.6 2.4* 2.6 4.1   GFR: Estimated Creatinine Clearance: 52.4 mL/min (A) (by C-G formula based on SCr of 1.07 mg/dL (H)). Liver Function Tests:  Recent Labs  Lab 09/19/16 0403 09/20/16 2214 09/21/16 0357 09/22/16 0433 09/23/16 0315  AST _0 ALT 10* 10* 9* 11* 11*  ALKPHOS 292* 345* 353* 428* 445*  BILITOT 0.9 0.9 0.7 0.7 0.8  PROT 4.1* 3.9* 4.1* 4.0* 4.2*  ALBUMIN 1.0* 1.2* 1.2* 1.2* 1.2*   No results for input(s): LIPASE, AMYLASE in the last 168 hours. No results for input(s): AMMONIA in the last 168 hours. Coagulation Profile: No results for input(s): INR, PROTIME in the last 168 hours. Cardiac Enzymes: No results for input(s): CKTOTAL, CKMB, CKMBINDEX, TROPONINI in the last 168 hours. BNP (last 3 results) No results for input(s): PROBNP in the last 8760 hours. HbA1C: No results for input(s): HGBA1C in the last 72 hours. CBG:  Recent Labs Lab 09/22/16 1115 09/22/16 1615 09/22/16 2116 09/23/16 0622 09/23/16 0651  GLUCAP 90 99 85 69 176*   Lipid Profile: No results for input(s): CHOL, HDL, LDLCALC, TRIG, CHOLHDL, LDLDIRECT in the last 72 hours. Thyroid Function Tests: No results for input(s): TSH, T4TOTAL, FREET4, T3FREE, THYROIDAB in the last 72 hours. Anemia Panel: No results for input(s): VITAMINB12, FOLATE, FERRITIN, TIBC, IRON, RETICCTPCT in the last 72 hours. Sepsis  Labs: No results for input(s): PROCALCITON, LATICACIDVEN in the last 168 hours.  Recent Results (from the past 240 hour(s))  Culture, Urine     Status: Abnormal   Collection Time: 09/16/16 10:49 AM  Result Value Ref Range Status   Specimen Description URINE, CLEAN CATCH  Final   Special Requests NONE  Final   Culture >=100,000 COLONIES/mL YEAST (A)  Final   Report Status 09/17/2016 FINAL  Final  Culture, blood (routine x 2)     Status: None   Collection Time: 09/16/16  9:34 PM  Result Value Ref Range Status   Specimen Description BLOOD RIGHT HAND  Final   Special Requests IN PEDIATRIC BOTTLE Blood Culture adequate volume  Final   Culture NO GROWTH 5 DAYS  Final   Report Status 09/21/2016 FINAL  Final  Culture, blood (routine x 2)     Status: Abnormal   Collection Time: 09/16/16  9:34 PM  Result Value Ref Range Status   Specimen Description BLOOD RIGHT HAND  Final   Special Requests IN PEDIATRIC BOTTLE Blood Culture adequate volume  Final   Culture  Setup Time   Final    YEAST IN PEDIATRIC BOTTLE CRITICAL RESULT CALLED TO, READ BACK BY AND VERIFIED WITH: G.ABBOTT, PHARMD 09/19/16 0330 L.CHAMPION    Culture CANDIDA ALBICANS (A)  Final   Report Status 09/20/2016 FINAL  Final  Gram stain     Status: None   Collection Time: 09/18/16 12:14 PM  Result Value Ref Range Status   Specimen Description PLEURAL LEFT  Final   Special Requests NONE  Final   Gram Stain   Final    MODERATE WBC PRESENT, PREDOMINANTLY PMN NO ORGANISMS SEEN    Report Status 09/18/2016 FINAL  Final  Culture, body fluid-bottle     Status: None (Preliminary result)   Collection Time: 09/18/16 12:14 PM  Result Value Ref Range Status   Specimen Description PLEURAL LEFT  Final   Special Requests NONE  Final   Culture NO GROWTH 4 DAYS  Final   Report Status PENDING  Incomplete  Culture, blood (Routine X 2) w Reflex to ID Panel     Status: None (Preliminary result)   Collection Time: 09/20/16  3:01 PM  Result  Value Ref Range Status   Specimen Description  BLOOD BLOOD RIGHT HAND  Final   Special Requests IN PEDIATRIC BOTTLE Blood Culture adequate volume  Final   Culture NO GROWTH 2 DAYS  Final   Report Status PENDING  Incomplete         Radiology Studies: No results found.      Scheduled Meds: . ALPRAZolam  0.25 mg Oral BID  . ARIPiprazole  5 mg Oral Daily  . aspirin EC  81 mg Oral Daily  . dextrose      . dronabinol  5 mg Oral BID AC  . DULoxetine  40 mg Oral Daily  . feeding supplement (ENSURE ENLIVE)  237 mL Oral TID BM  . feeding supplement (PRO-STAT SUGAR FREE 64)  30 mL Oral BID  . furosemide  40 mg Oral Daily  . insulin aspart  0-5 Units Subcutaneous QHS  . insulin aspart  0-9 Units Subcutaneous TID WC  . metoprolol tartrate  12.5 mg Oral BID  . pantoprazole  40 mg Oral QHS  . phosphorus  500 mg Oral TID  . potassium chloride  40 mEq Oral Q4H  . sodium chloride flush  3 mL Intravenous Q12H   Continuous Infusions: . fluconazole (DIFLUCAN) IV Stopped (09/22/16 1717)  . magnesium sulfate 1 - 4 g bolus IVPB    . potassium chloride       LOS: 14 days    Time spent: 35 minutes    A Melven Sartorius, MD [facility] Pager [phone]  If 7PM-7AM, please contact night-coverage [email or web address] Password [email or web address] 09/23/2016, 9:22 AM

## 2016-09-23 NOTE — Progress Notes (Signed)
Physical Therapy Treatment Patient Details Name: Haley Graham MRN: [id number] DOB: July 31, 1955 Today's Date: 09/23/2016    History of Present Illness This 61 y.o. female admitted with sepsis, hypotension, dehydration, amemia, COPD, AKI due to poor oral intake and hypotension/dehydration.  PMH  includes:  Recent prolonged hospitalization for [place] type B aortic dissection s/p descending thoracic aortic stent graft placement; anxiety, GERD, COPD,     PT Comments    Pt continues to require maximal encouragement to participate but eventually agrees to short walk. Pt currently modAx2 for bed mobility, and transfers and maxAx1 for ambulation of 5 feet with RW. Pt initially ambulated with minAx1 but experienced LoB after L knee buckling that required maxA to regain balance and lower into recliner. Pt refused any additional attempts at gait and requested to go back to bed. Pt requires skilled PT to progress mobility and to improve LE strength and endurance to safely mobilize in her discharge environment.   Follow Up Recommendations  SNF           Precautions / Restrictions Precautions Precautions: Fall Restrictions Weight Bearing Restrictions: No    Mobility  Bed Mobility Overal bed mobility: Needs Assistance Bed Mobility: Supine to Sit;Sit to Sidelying;Rolling Rolling: Min assist   Supine to sit: Max assist   Sit to sidelying: Mod assist;+2 for physical assistance General bed mobility comments: cues for sequencing and technique; assist to mobilize bilat LE and elevate trunk into sitting, maxA required to move bilateral LE into bed  Transfers Overall transfer level: Needs assistance Equipment used: 2 person hand held assist Transfers: Sit to/from [facility] Sit to Stand: Mod assist;+2 physical assistance         General transfer comment: assist to power up into standing and to steady with RW  Ambulation/Gait Ambulation/Gait assistance: Max  assist Ambulation Distance (Feet): 5 Feet Assistive device: Rolling walker (2 wheeled) Gait Pattern/deviations: Step-to pattern;Decreased stride length;Shuffle;Trunk flexed Gait velocity: slowed Gait velocity interpretation: Below normal speed for age/gender General Gait Details: initially required minA for ambulation but experienced LOB requiring maxA to steady and lower into recliner, pt refused any additional attempts at gait       Balance Overall balance assessment: Needs assistance Sitting-balance support: Bilateral upper extremity supported;Feet supported Sitting balance-Leahy Scale: Fair     Standing balance support: Bilateral upper extremity supported Standing balance-Leahy Scale: Poor Standing balance comment: requires RW assist                            Cognition Arousal/Alertness: Awake/alert Behavior During Therapy: Flat affect Overall Cognitive Status: Impaired/Different from baseline Area of Impairment: Safety/judgement;Problem solving;Attention;Following commands                   Current Attention Level: Selective   Following Commands: Follows one step commands with increased time Safety/Judgement: Decreased awareness of safety;Decreased awareness of deficits Awareness: Intellectual Problem Solving: Slow processing;Decreased initiation;Requires verbal cues;Requires tactile cues;Difficulty sequencing General Comments: minimal interaction from pt          General Comments General comments (skin integrity, edema, etc.): VSS, Pt continues to require maximal encouragement to participate in PT session       Pertinent Vitals/Pain Pain Assessment: Faces Faces Pain Scale: Hurts a little bit Pain Location: IV site on R UE Pain Descriptors / Indicators: Grimacing;Guarding Pain Intervention(s): Monitored during session;Limited activity within patient's tolerance;Patient requesting pain meds-RN notified  PT Goals (current goals can  now be found in the care plan section) Acute Rehab PT Goals Patient Stated Goal: None stated PT Goal Formulation: With patient Time For Goal Achievement: 09/07/2016 Potential to Achieve Goals: Fair Progress towards PT goals: Progressing toward goals    Frequency    Min 3X/week      PT Plan Current plan remains appropriate       AM-PAC PT "6 Clicks" Daily Activity  Outcome Measure  Difficulty turning over in bed (including adjusting bedclothes, sheets and blankets)?: Unable Difficulty moving from lying on back to sitting on the side of the bed? : Unable Difficulty sitting down on and standing up from a chair with arms (e.g., wheelchair, bedside commode, etc,.)?: Unable Help needed moving to and from a bed to chair (including a wheelchair)?: A Lot Help needed walking in hospital room?: A Lot Help needed climbing 3-5 steps with a railing? : Total 6 Click Score: 8    End of Session Equipment Utilized During Treatment: Gait belt Activity Tolerance: Patient limited by fatigue Patient left: in bed;with call bell/phone within reach;with SCD's reapplied;with family/visitor present Nurse Communication: Mobility status;Patient requests pain meds PT Visit Diagnosis: Muscle weakness (generalized) (M62.81);Other abnormalities of gait and mobility (R26.89);Unsteadiness on feet (R26.81);Adult, failure to thrive (R62.7)     Time: 5638-9373 PT Time Calculation (min) (ACUTE ONLY): 17 min  Charges:  $Gait Training: 8-22 mins                    G Codes:       Rivky Clendenning B. Migdalia Dk PT, DPT Acute Rehabilitation  [phone] Pager [phone]     [place] 09/23/2016, 4:52 PM

## 2016-09-23 NOTE — Progress Notes (Signed)
Hypoglycemic Event  CBG: 69  Treatment: D50 IV 25 mL  Symptoms: None  Follow-up CBG: DGLO:[id number] CBG Result:176  Possible Reasons for Event: Inadequate meal intake   monitor  Haley Graham, Haley Graham

## 2016-09-24 LAB — PHOSPHORUS: PHOSPHORUS: 3.9 mg/dL (ref 2.5–4.6)

## 2016-09-24 LAB — CBC
HCT: 28.8 % — ABNORMAL LOW (ref 36.0–46.0)
HEMOGLOBIN: 9.3 g/dL — AB (ref 12.0–15.0)
MCH: 30.6 pg (ref 26.0–34.0)
MCHC: 32.3 g/dL (ref 30.0–36.0)
MCV: 94.7 fL (ref 78.0–100.0)
PLATELETS: 100 10*3/uL — AB (ref 150–400)
RBC: 3.04 MIL/uL — ABNORMAL LOW (ref 3.87–5.11)
RDW: 23.2 % — ABNORMAL HIGH (ref 11.5–15.5)
WBC: 7.6 10*3/uL (ref 4.0–10.5)

## 2016-09-24 LAB — COMPREHENSIVE METABOLIC PANEL
ALK PHOS: 460 U/L — AB (ref 38–126)
ALT: 14 U/L (ref 14–54)
ANION GAP: 9 (ref 5–15)
AST: 29 U/L (ref 15–41)
Albumin: 1.3 g/dL — ABNORMAL LOW (ref 3.5–5.0)
BUN: 21 mg/dL — ABNORMAL HIGH (ref 6–20)
CALCIUM: 7.2 mg/dL — AB (ref 8.9–10.3)
CO2: 21 mmol/L — AB (ref 22–32)
Chloride: 106 mmol/L (ref 101–111)
Creatinine, Ser: 1.16 mg/dL — ABNORMAL HIGH (ref 0.44–1.00)
GFR, EST AFRICAN AMERICAN: 58 mL/min — AB (ref >60)
GFR, EST NON AFRICAN AMERICAN: 50 mL/min — AB (ref >60)
Glucose, Bld: 104 mg/dL — ABNORMAL HIGH (ref 65–99)
Potassium: 3.9 mmol/L (ref 3.5–5.1)
SODIUM: 136 mmol/L (ref 135–145)
Total Bilirubin: 0.7 mg/dL (ref 0.3–1.2)
Total Protein: 4.7 g/dL — ABNORMAL LOW (ref 6.5–8.1)

## 2016-09-24 LAB — GLUCOSE, CAPILLARY
GLUCOSE-CAPILLARY: 78 mg/dL (ref 65–99)
GLUCOSE-CAPILLARY: 108 mg/dL — AB (ref 65–99)
Glucose-Capillary: 97 mg/dL (ref 65–99)
Glucose-Capillary: 102 mg/dL — ABNORMAL HIGH (ref 65–99)

## 2016-09-24 LAB — MAGNESIUM: Magnesium: 1.7 mg/dL (ref 1.7–2.4)

## 2016-09-24 MED ORDER — FLUCONAZOLE 100 MG PO TABS
400.0000 mg | ORAL_TABLET | Freq: Every day | ORAL | Status: DC
  Administered 2016-09-24 – 2016-10-12 (×19): 400 mg via ORAL
  Filled 2016-09-24: qty 2
  Filled 2016-09-24 (×2): qty 4
  Filled 2016-09-24 (×2): qty 2
  Filled 2016-09-24 (×2): qty 4
  Filled 2016-09-24: qty 2
  Filled 2016-09-24: qty 4
  Filled 2016-09-24 (×2): qty 2
  Filled 2016-09-24: qty 4
  Filled 2016-09-24: qty 2
  Filled 2016-09-24 (×4): qty 4
  Filled 2016-09-24 (×2): qty 2

## 2016-09-24 NOTE — Progress Notes (Signed)
PROGRESS NOTE    Haley Graham  WIO:[id number] DOB: 08/25/1955 DOA: 09/23/2016 PCP: Haley Chroman, MD (Confirm with patient/family/NH records and if not entered, this HAS to be entered at [facility] point of entry. 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 She presented with weakness and met sepsis criteria with concern for enteritis. Antibiotics started.   Patient had worsening Cr and Leukocytosis worsened and now improved. Complaining of Nausea and Abdominal Pain so will re-culture and repeat Imaging. Appears depressed and does not want to eat and just wants to lay in bed so will get Psych evaluation and place patient on Marinol. Discussed with Haley Graham about Palliative Care Consult and he declined at this time. Patient refusing care and does not want to participate. Psych Evaluated and has adjusted medications. Patient also was found to have a Left Pleural Effusion and underwent Thoracentesis and also was transfused 1 unit of pRBCs on 09/18/16.   This AM she was found to have a Fungemia with 1/2 Blood Cx growing Yeast so Fluconazole was D/C'd and patient changed to Eraxis. Infectious Diseases was consulted and recommended getting repeat Imaging of Graft as she was high risk of Seeding. Sensitivities of Yeast revealed Candidemia so Eraxis was changed back to Fluconazole.  Cefepime and Metronidazole were discontinued and Repeat Blood Cx showed NGTD at 2 days. Palliative Care met with the patient yesterday and today and Goals of Care to be discussed but patient not motivated and unwilling to  participate.  It has been extremely hard to motivate the patient to do anything and per nurse the patient has been refusing medications and Physical Therapy.   Plan for fluconazole to continue through 9/26 per ID.  She'll need an eye exam within 1 month.  Will need to discuss follow up imaging of graft and echo with ID.  We discussed cortrak for nutrition today on 8/23 and Haley Graham was noncommittal, order is placed for tomorrow.  Mood continues to be major barrier.   Assessment & Plan:   Principal Problem:   Candidemia ([place]) Active Problems:   GERD (gastroesophageal reflux disease)   Dissection of thoracoabdominal aorta (HCC)   AAA (abdominal aortic aneurysm) (HCC)   Benign essential HTN   Diabetes mellitus type 2 in obese (HCC)   Anxiety state   Hyponatremia   Leukocytosis   Sepsis (HCC)   Hypotension   AKI (acute kidney injury) ([place])   Dehydration   Generalized weakness   Anemia   Thrombocytopenia (HCC)   Abnormal computed tomography angiography (CTA) of abdomen and pelvis   Colitis: Probable   Protein-calorie malnutrition, severe   Palliative care by specialist   Adult failure to thrive   Hypophosphatemia   Hypomagnesemia   Sepsis, improving  -Suspected Secondary to colitis/enteritis but patient found to have a Bacteremia with Yeast and grew out Candida Albicans urine from 8/15 notably also grew out candida albicans (09/16/16 - repeat blood culture from 8/19 NGTD x 4 days) - was started on fluconazole initially and briefly on eraxis, but switched back to fluconazole with candida albicans to complete on 9/26 -WBC improved to normal from 20's -GI pathogen panel and C. Diff negative.  -Initial Urine/bloodculture negative.  Repeat U/A shows evidence of UTI with Many bacteria, Large Hb, Trace Leukocytes, Positive Nitrites, TNTC RBC, TNTC WBC -IV Flagyl and Cefepime now Discontinued per ID Recc's as she has had adequate Treatment -Repeat CT Chest/Abd/Pelvis w/o Contrast  done and showed Pleural Effusion -Continue to Monitor  -Will need reimaging of Graft as she is at high risk of seeding and likely repeat Limited ECHO  '[ ]'  discuss with ID timing '[ ]'  Will likely also need Opthamology Consult at some point for Dilated Opthalmologic exam and ID recommending within the next few weeks -Palliative Care Consulted and encouraged patient and will met to Discuss Code Status and patient refusing to participate in Discussion as she has no motivation; Remains FULL CODE  Candidemia/Yeast UTI -As above -Continue Fluconazole per ID Recc's  -Will need reimaging of Graft as she is at high risk of seeding and likely repeat Limited ECHO -Will also need Opthalmologic Evaluation at some point  -Infectious Disease has signed off, but will discuss follow up imaging.   Hypotension, improved -Improved with IV fluids which has made her anasarca worse. Troponin negative. Albumin extremely low. -Gave 1x dose of IV Albumin on 09/19/16 -monitor BP  Dehydration/Poor oral intake/FTT -encourage oral intake -dietitian consulted: protein supplementation as patient has Significant Hypoalbuminemia and LE swelling  -Will add Marinol to help appetite Stimulant; Increased to 5 mg po BID  -Albumin was 1.2  -Nutritionist states she has no other recommendations as it is very difficult to motivate the patient to even take po as she has been refusing  '[ ]'  will try to get corpak tomorrow   Normocytic Anemia -Hb/Hct stable at 10/06/26.8 -Type and Screen and Transfused 1 unit of pRBC on 8/17 -Has some Hematuria and dark urine -Repeat CBC in AM  COPD Stable. -continue nebulizers  Acute Kidney Injury - slightly worse today at 1.16, but overall improved, peaked at 1.5,  -Suspected Secondary to hypotension and Hx of Atrophic Left Kidney -Continue to Monitor and Avoid Nephrotoxics -Repeat CMP in AM   Anasarca -Secondary to IV fluids. Recent echo significant for grade 1 diastolic  dysfunction.Repeat echocardiogram significant for an EF of 65-70% with grade 1 diastolic dysfunction and trace mitral regurg and tricuspid regurg -Lasix was increased to 40 mg daily; Given IV Albumin on 09/20/16 -Albumin was 1.2 -CT scan showed Small amount of abdominal and pelvic ascites. Diffuse body wall edema consistent with anasarca  Oliguria -Improvement with diuresis -strict in/out (most appear unmeasured)  -Patient +2.573 Liters since Admit  Hematuria  Proteinuria '[ ]'  repeat UA   -Tea colored urine. Decreased urine outputinitially which is now improved. Liver unremarkable for acute process. Vascular surgery without recommendations for thrombosis on consultation. -Monitor and repeated U/A today  -CT Scan showed Negative for nephrolithiasis, hydronephrosis or ureteral stone -May need to discuss with Urology -Foley Catheter discontinued; Continue to Monitor UOP and Strict I's and O's -Will not place foley back or do In-and-Out Cath  Hyponatremia - improved today.  Likely secondary to third spacing. -Continue to Monitor and repeat CMP in AM   Elevated alkaline phosphatase S/p cholecystecomy. GGT elevated. Alkaline phosphatase woresned from 345 -> 460.  -RUQ unremarkable except for fatty liver -Continue to Monitor   Initial Abnormal CT angiogram of chest/abdomen/pelvis -Thrombus of left renal artery. Vascular surgery consulted with no recommendations for acute management. -Repeat CT Scan showed Displaced wall calcification in the infrarenal abdominal aorta consistent with known thrombosed dissection.  Thrombocytopenia -Presumed secondary to acute illness.  -Platelet Count now  100 -Dr. Lonny Prude had discussed with hematologist, Dr. Julien Nordmann who recommends continued surveillance.  -Can follow-up as an outpatient if not improved by discharge. -Pathology smear review showed Leukocytosis with predominance of neutrophils with toxic features. Rare circulating myelocyte.  -Repat  CBC in AM   GERD Stable. -continue PPI  Depression/Anxiety -Discontinued Klonipin -Consulted Psychiatry Dr. Louretta Shorten and appreciated Assistance -Psychiatry changed Xanax 0.25 mg BID and discontinued Celexa; Patient was started on Cymbalta 40 mg po Daily for Depression  -C/w Marinol for Appetite  -Very difficult to Motivate and does not want to participate in Care '[ ]'  will need to touch base with psych again given severe depression  [place] type B aortic dissection -Patient is s/p stent graft placement on previous admission. -CT showed stable non contrasted appearance of the stent graft at the aortic arch and proximal descending thoracic aorta -Continue metoprolol at reduce dose  Tachycardia improved  irregular HR '[ ]'  yesterday morning HR seemed irregular (possibly ectopy or sinus arrhythmia), will obtain EKG and place on Tele -> tele sinus, EKG pending -Rebound secondary to abrupt cessation of beta-blocker. -continue metoprolol at reduced dose of 12.33m BID  Leukocytosis -improved  -WBC went from 26.9 -> 5.0 -Inital Blood cultures negative and Repeat showed 1/2 Yeast.  Repeat from 8/19 is NGTD.  -Pro calcitonin 0.24 on admission. -Repeat Imaging showed Bilateral pleural effusions, small on the right and moderate on the left. Worsening consolidation in the lower lobes may reflect atelectasis or pneumonia. -Patient Currently on fluconazole  Hypokalemia - improved today -Continue to Monitor and Replete as Necessary -Repeat CMP in AM  Hypophosphatemia, improving -pending today -d/c K phos will ctm -Continue to Monitor and Replete as Necessary -Repeat Phos Level in AM   Hypomagnesemia - pending -Continue to Monitor and Replete as Necessary -Repeat Mag Level in AM  Pleural Effusion on Left -U/S Guided Thoracentesis Ordered 09/19/17 -Resulted 0.65 mL of Fluid Removal -Repeat CXR 8/18 showed No change from previous day's study. 2. Persistent left lung base  opacity consistent with combination atelectasis and a probable small effusion   DVT prophylaxis: SCD's for low platelets, consider pharm VTE ppx given improvement if stable tomorrow Code Status: full  Family Communication:  Haley Graham Disposition Plan: pending improvement in PO, functional status   Consultants:   ID, nutrition, psych, palliative care  Procedures: (Don't include imaging studies which can be auto populated. Include things that cannot be auto populated i.e. 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  Continued thrombosis of L renal artery with progressive atrophy of L kidney.  Fatty infiltration of liver.  Wall enhancement of several loos of small bowel and large bowe (colitis or enteritis).  Interval development of linear filling defect in R external iliac atery (thrombus, dissection?) potentiall related to stent placement.   Antimicrobials: (specify start and planned stop date.  Auto populated tables are space occupying and do not give end dates)  fluconazole    Subjective: Feeling weak, tired.  Overall about the same as yesterday.  Abdominal discomfort unchanged.  Just wants to be done, doesn't want [place] to bother  her anymore.    Objective: Vitals:   09/23/16 1230 09/23/16 2005 09/24/16 0548 09/24/16 0806  BP: 120/67 111/60 113/61 117/68  Pulse: 81 97 96 (!) 102  Resp: '18 16 15 18  ' Temp: 97.9 F (36.6 C) 97.8 F (36.6 C) 98.9 F (37.2 C) 97.8 F (36.6 C)  TempSrc: Oral Oral Oral Oral  SpO2: 100% 96% 97% 100%  Weight:   76.6 kg (168 lb 14.4 oz)   Height:        Intake/Output Summary (Last 24 hours) at 09/24/16 1142 Last data filed at 09/24/16 0954  Gross per 24 hour  Intake              480 ml  Output              400 ml  Net               80 ml   Filed Weights   09/22/16 0510 09/23/16 0623 09/24/16 0548  Weight: 79.1 kg (174 lb 4.8 oz) 69.8 kg (153 lb 14.4 oz) 76.6 kg (168 lb 14.4 oz)    Examination:  General exam: Appears calm and comfortable  Respiratory system: Clear to auscultation. Respiratory effort normal. Cardiovascular system: S1 & S2 heard, irregular. No JVD, murmurs, rubs, gallops or clicks. Gastrointestinal system: mild TTP, nondistended, no rebound or guarding. No organomegaly or masses felt. Normal bowel sounds heard. Central nervous system: Alert and oriented. No focal neurological deficits. Extremities: 3+ LEE Skin: No rashes, lesions or ulcers, bruising Psychiatry: depressed mood, disengaged.     Data Reviewed: I have personally reviewed following labs and imaging studies  CBC:  Recent Labs Lab 09/18/16 0255 09/19/16 0403 09/20/16 2007 09/21/16 0357 09/22/16 0433 09/23/16 0315  WBC 14.3* 14.8* 9.5 8.0 5.3 5.0  NEUTROABS 11.6* 12.6*  --  5.4 3.9 3.3  HGB 7.3* 9.4* 8.9* 8.2* 8.1* 8.3*  HCT 20.6* 28.7* 26.5* 23.7* 24.6* 24.8*  MCV 98.1 92.3 92.7 95.6 92.5 93.9  PLT 57* 64* 72* 72* 73* 72*   Basic Metabolic Panel:  Recent Labs Lab 09/19/16 0403 09/20/16 2214 09/21/16 0357 09/22/16 0433 09/23/16 0315 09/23/16 1926  NA 130* 133* 133* 136 137 135  K 3.0* 3.9 3.3* 2.8* 2.6* 3.5  CL 106 111 112* 110 109 107  CO2 19* 15* 18* 21* 21* 22    GLUCOSE 103* 105* 97 91 74 102*  BUN 27* 32* 30* 27* 22* 21*  CREATININE 1.40* 1.37* 1.31* 1.21* 1.07* 1.04*  CALCIUM 7.0* 7.2* 7.0* 7.0* 7.0* 6.8*  MG 1.9 1.8 1.6* 2.0 1.5*  --   PHOS 3.5 2.6 2.4* 2.6 4.1  --    GFR: Estimated Creatinine Clearance: 56.4 mL/min (A) (by C-G formula based on SCr of 1.04 mg/dL (H)). Liver Function Tests:  Recent Labs Lab 09/19/16 0403 09/20/16 2214 09/21/16 0357 09/22/16 0433 09/23/16 0315  AST '19 24 19 17 19  ' ALT 10* 10* 9* 11* 11*  ALKPHOS 292* 345* 353* 428* 445*  BILITOT 0.9 0.9 0.7 0.7 0.8  PROT 4.1* 3.9* 4.1* 4.0* 4.2*  ALBUMIN 1.0* 1.2* 1.2* 1.2* 1.2*   No results for input(s): LIPASE, AMYLASE in the last 168 hours. No results for input(s): AMMONIA in the last 168 hours.  Coagulation Profile: No results for input(s): INR, PROTIME in the last 168 hours. Cardiac Enzymes: No results for input(s): CKTOTAL, CKMB, CKMBINDEX, TROPONINI in the last 168 hours. BNP (last 3 results) No results for input(s): PROBNP in the last 8760 hours. HbA1C: No results for input(s): HGBA1C in the last 72 hours. CBG:  Recent Labs Lab 09/23/16 0651 09/23/16 1229 09/23/16 1633 09/23/16 2118 09/24/16 0746  GLUCAP 176* 118* 117* 96 78   Lipid Profile: No results for input(s): CHOL, HDL, LDLCALC, TRIG, CHOLHDL, LDLDIRECT in the last 72 hours. Thyroid Function Tests: No results for input(s): TSH, T4TOTAL, FREET4, T3FREE, THYROIDAB in the last 72 hours. Anemia Panel: No results for input(s): VITAMINB12, FOLATE, FERRITIN, TIBC, IRON, RETICCTPCT in the last 72 hours. Sepsis Labs: No results for input(s): PROCALCITON, LATICACIDVEN in the last 168 hours.  Recent Results (from the past 240 hour(s))  Culture, Urine     Status: Abnormal   Collection Time: 09/16/16 10:49 AM  Result Value Ref Range Status   Specimen Description URINE, CLEAN CATCH  Final   Special Requests NONE  Final   Culture >=100,000 COLONIES/mL YEAST (A)  Final   Report Status 09/17/2016  FINAL  Final  Culture, blood (routine x 2)     Status: None   Collection Time: 09/16/16  9:34 PM  Result Value Ref Range Status   Specimen Description BLOOD RIGHT HAND  Final   Special Requests IN PEDIATRIC BOTTLE Blood Culture adequate volume  Final   Culture NO GROWTH 5 DAYS  Final   Report Status 09/21/2016 FINAL  Final  Culture, blood (routine x 2)     Status: Abnormal   Collection Time: 09/16/16  9:34 PM  Result Value Ref Range Status   Specimen Description BLOOD RIGHT HAND  Final   Special Requests IN PEDIATRIC BOTTLE Blood Culture adequate volume  Final   Culture  Setup Time   Final    YEAST IN PEDIATRIC BOTTLE CRITICAL RESULT CALLED TO, READ BACK BY AND VERIFIED WITH: G.ABBOTT, PHARMD 09/19/16 0330 L.CHAMPION    Culture CANDIDA ALBICANS (A)  Final   Report Status 09/20/2016 FINAL  Final  Gram stain     Status: None   Collection Time: 09/18/16 12:14 PM  Result Value Ref Range Status   Specimen Description PLEURAL LEFT  Final   Special Requests NONE  Final   Gram Stain   Final    MODERATE WBC PRESENT, PREDOMINANTLY PMN NO ORGANISMS SEEN    Report Status 09/18/2016 FINAL  Final  Culture, body fluid-bottle     Status: None   Collection Time: 09/18/16 12:14 PM  Result Value Ref Range Status   Specimen Description PLEURAL LEFT  Final   Special Requests NONE  Final   Culture NO GROWTH 5 DAYS  Final   Report Status 09/23/2016 FINAL  Final  Culture, blood (Routine X 2) w Reflex to ID Panel     Status: None (Preliminary result)   Collection Time: 09/20/16  3:01 PM  Result Value Ref Range Status   Specimen Description BLOOD BLOOD RIGHT HAND  Final   Special Requests IN PEDIATRIC BOTTLE Blood Culture adequate volume  Final   Culture NO GROWTH 4 DAYS  Final   Report Status PENDING  Incomplete         Radiology Studies: No results found.      Scheduled Meds: . ALPRAZolam  0.25 mg Oral BID  . ARIPiprazole  5 mg Oral Daily  . aspirin EC  81 mg Oral  Daily  .  dronabinol  5 mg Oral BID AC  . DULoxetine  40 mg Oral Daily  . feeding supplement (ENSURE ENLIVE)  237 mL Oral TID BM  . feeding supplement (PRO-STAT SUGAR FREE 64)  30 mL Oral BID  . fluconazole  400 mg Oral Daily  . furosemide  40 mg Oral Daily  . insulin aspart  0-5 Units Subcutaneous QHS  . insulin aspart  0-9 Units Subcutaneous TID WC  . metoprolol tartrate  12.5 mg Oral BID  . pantoprazole  40 mg Oral QHS  . sodium chloride flush  3 mL Intravenous Q12H   Continuous Infusions:    LOS: 15 days    Time spent: 35 minutes    A Melven Sartorius, MD [facility] Pager [phone]  If 7PM-7AM, please contact night-coverage [email or web address] Password [facility] 09/24/2016, 11:42 AM

## 2016-09-24 NOTE — Progress Notes (Signed)
Report called and patient transferred to [facility].  Belongings packed and sent with patient and husband. Patient completed last dose of IV potassium and phlebotomy aware of need for timed labs at 2000. Pt resting with call bell within reach. Payton Emerald, RN

## 2016-09-25 ENCOUNTER — Inpatient Hospital Stay (HOSPITAL_COMMUNITY): Payer: BLUE CROSS/BLUE SHIELD

## 2016-09-25 LAB — CULTURE, BLOOD (ROUTINE X 2)
CULTURE: NO GROWTH
Special Requests: ADEQUATE

## 2016-09-25 LAB — COMPREHENSIVE METABOLIC PANEL
ALBUMIN: 1.2 g/dL — AB (ref 3.5–5.0)
ALT: 11 U/L — ABNORMAL LOW (ref 14–54)
ANION GAP: 7 (ref 5–15)
AST: 20 U/L (ref 15–41)
Alkaline Phosphatase: 344 U/L — ABNORMAL HIGH (ref 38–126)
BILIRUBIN TOTAL: 0.8 mg/dL (ref 0.3–1.2)
BUN: 20 mg/dL (ref 6–20)
CO2: 24 mmol/L (ref 22–32)
Calcium: 7 mg/dL — ABNORMAL LOW (ref 8.9–10.3)
Chloride: 105 mmol/L (ref 101–111)
Creatinine, Ser: 1.06 mg/dL — ABNORMAL HIGH (ref 0.44–1.00)
GFR calc non Af Amer: 56 mL/min — ABNORMAL LOW (ref >60)
GLUCOSE: 75 mg/dL (ref 65–99)
POTASSIUM: 3.4 mmol/L — AB (ref 3.5–5.1)
SODIUM: 136 mmol/L (ref 135–145)
TOTAL PROTEIN: 4.1 g/dL — AB (ref 6.5–8.1)

## 2016-09-25 LAB — CBC
HEMATOCRIT: 21.9 % — AB (ref 36.0–46.0)
HEMOGLOBIN: 7.4 g/dL — AB (ref 12.0–15.0)
MCH: 33.2 pg (ref 26.0–34.0)
MCHC: 33.8 g/dL (ref 30.0–36.0)
MCV: 98.2 fL (ref 78.0–100.0)
Platelets: 85 10*3/uL — ABNORMAL LOW (ref 150–400)
RBC: 2.23 MIL/uL — AB (ref 3.87–5.11)
RDW: 24.5 % — ABNORMAL HIGH (ref 11.5–15.5)
WBC: 6.7 10*3/uL (ref 4.0–10.5)

## 2016-09-25 LAB — URINALYSIS, ROUTINE W REFLEX MICROSCOPIC
BILIRUBIN URINE: NEGATIVE
Bacteria, UA: NONE SEEN
Glucose, UA: NEGATIVE mg/dL
KETONES UR: NEGATIVE mg/dL
Leukocytes, UA: NEGATIVE
Nitrite: NEGATIVE
Protein, ur: 30 mg/dL — AB
Specific Gravity, Urine: 1.013 (ref 1.005–1.030)
pH: 6 (ref 5.0–8.0)

## 2016-09-25 LAB — GLUCOSE, CAPILLARY
GLUCOSE-CAPILLARY: 101 mg/dL — AB (ref 65–99)
GLUCOSE-CAPILLARY: 102 mg/dL — AB (ref 65–99)
Glucose-Capillary: 94 mg/dL (ref 65–99)
Glucose-Capillary: 97 mg/dL (ref 65–99)

## 2016-09-25 LAB — HEMOGLOBIN AND HEMATOCRIT, BLOOD
HEMATOCRIT: 23.8 % — AB (ref 36.0–46.0)
HEMOGLOBIN: 7.7 g/dL — AB (ref 12.0–15.0)

## 2016-09-25 LAB — PHOSPHORUS: PHOSPHORUS: 4.1 mg/dL (ref 2.5–4.6)

## 2016-09-25 LAB — MAGNESIUM: MAGNESIUM: 1.5 mg/dL — AB (ref 1.7–2.4)

## 2016-09-25 MED ORDER — LIDOCAINE HCL 2 % EX GEL
1.0000 "application " | Freq: Once | CUTANEOUS | Status: DC
  Filled 2016-09-25: qty 5

## 2016-09-25 MED ORDER — PRO-STAT SUGAR FREE PO LIQD
30.0000 mL | Freq: Every day | ORAL | Status: DC
  Administered 2016-09-26 – 2016-09-27 (×2): 30 mL
  Filled 2016-09-25 (×5): qty 30

## 2016-09-25 MED ORDER — VITAL 1.5 CAL PO LIQD
1000.0000 mL | ORAL | Status: DC
  Administered 2016-09-25: 1000 mL
  Filled 2016-09-25 (×5): qty 1000

## 2016-09-25 MED ORDER — POTASSIUM CHLORIDE CRYS ER 20 MEQ PO TBCR
40.0000 meq | EXTENDED_RELEASE_TABLET | Freq: Once | ORAL | Status: AC
  Administered 2016-09-25: 40 meq via ORAL
  Filled 2016-09-25: qty 2

## 2016-09-25 MED ORDER — ALBUMIN HUMAN 5 % IV SOLN
25.0000 g | Freq: Once | INTRAVENOUS | Status: AC
  Administered 2016-09-25: 25 g via INTRAVENOUS
  Filled 2016-09-25: qty 500

## 2016-09-25 MED ORDER — MAGNESIUM SULFATE 2 GM/50ML IV SOLN
2.0000 g | Freq: Once | INTRAVENOUS | Status: AC
  Administered 2016-09-25: 2 g via INTRAVENOUS
  Filled 2016-09-25: qty 50

## 2016-09-25 NOTE — Progress Notes (Signed)
Cortrak Tube Team Note:  Consult received to place a Cortrak feeding tube.   A 10 F Cortrak tube was placed in the L nare and secured with a nasal bridle at 87 cm. Per the Cortrak monitor reading the tube tip is mid duodenum.   Due to postpyloric access being required,  X-ray is required, abdominal x-ray has been ordered by the Cortrak team. Please confirm tube placement before using the Cortrak tube.   If the tube becomes dislodged please keep the tube and contact the Cortrak team at [email or web address] (password [email or web address]) for replacement.  If after hours and replacement cannot be delayed, place a NG tube and confirm placement with an abdominal x-ray.    Burtis Junes RD, LDN, CNSC Clinical Nutrition Pager: [phone] 09/25/2016 1:45 PM

## 2016-09-25 NOTE — Progress Notes (Signed)
Nutrition Follow-up  DOCUMENTATION CODES:   Severe malnutrition in context of chronic illness  INTERVENTION:   Tube Feeding:   Recommend Vital 1.5 at initial rate of 20 ml/hr with goal rate of 55 ml/hr with Pro-Stat daily via Cortrak tube providing 105 g of protein, 2080 kcals, 1003 mL of free water. If tolerating, titrate by 10 mL q 8 hours until goal rate met. Meets 100% estimated calorie and protein needs.   Monitor magnesium, potassium, and phosphorus daily for at least 3 days, MD to replete as needed, as pt is at risk for refeeding syndrome given severe malnutrition in context of chronic illness with inadequate oral intake.  Noted pt with hx of gastroparesis, continues to complain of nausea. Pt with allergy to Reglan per chart. Pt may benefit from trial of erythromycin for gut motility to see if this helps  NUTRITION DIAGNOSIS:   Malnutrition (severe) related to chronic illness (failure to thrive post repair aortic dissection) as evidenced by energy intake < or equal to 50% for > or equal to 1 month, percent weight loss.  Being addressed via TF  GOAL:   Patient will meet greater than or equal to 90% of their needs  progressing  MONITOR:   PO intake, Supplement acceptance, Labs, Weight trends, Skin, I & O's  REASON FOR ASSESSMENT:   Consult Poor PO  ASSESSMENT:    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 She presented with weakness and met sepsis criteria with concern for enteritis. Antibiotics started. Cultures pending.  Cortrak placed today, post-pyloric position  Consult for RD to initiate/manage enteral nutrition  Pt complains of nausea and  abdominal pain  Labs: potassium 3.4, magnesium 1.5, CBGs 94-101 Meds: marinol, lasix,   Diet Order:  DIET SOFT Room service appropriate? Yes; Fluid consistency: Thin  Skin:  Reviewed, no issues (no pressure ulcers)  Last BM:  8/22  Height:   Ht Readings from Last 1 Encounters:  09/10/16 _0  (1.6 m)    Weight:   Wt Readings from Last 1 Encounters:  09/25/16 155 lb 6.4 oz (70.5 kg)    Ideal Body Weight:  52.2 kg  BMI:  Body mass index is 27.53 kg/m.  Estimated Nutritional Needs:   Kcal:  2000-2200  Protein:  110-120 gm  Fluid:  2.0-2.2 L  EDUCATION NEEDS:   No education needs identified at this time  [place], [place], LDN [phone] Pager  [phone] Weekend/On-Call Pager

## 2016-09-25 NOTE — Progress Notes (Signed)
Bladder scan per order.   621ml scanned.  Called Dr. and made aware.

## 2016-09-25 NOTE — Progress Notes (Signed)
Physical Therapy Treatment Patient Details Name: DEQUITA SCHLEICHER MRN: [id number] DOB: 14-Dec-1955 Today's Date: 09/25/2016    History of Present Illness This 61 y.o. female admitted with sepsis, hypotension, dehydration, amemia, COPD, AKI due to poor oral intake and hypotension/dehydration.  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 Pt continues to be self limiting. Pt up in recliner end of session and husband present. Current plan remains appropriate.   Follow Up Recommendations  SNF     Equipment Recommendations  None recommended by PT    Recommendations for Other Services       Precautions / Restrictions Precautions Precautions: Fall Restrictions Weight Bearing Restrictions: No    Mobility  Bed Mobility Overal bed mobility: Needs Assistance Bed Mobility: Supine to Sit     Supine to sit: Mod assist     General bed mobility comments: multmodal cues for sequencing and use of rail; assist to bring bilat LE from EOB and to elevate trunk into sitting  Transfers Overall transfer level: Needs assistance Equipment used: Rolling walker (2 wheeled) Transfers: Sit to/from Stand Sit to Stand: Mod assist;+2 physical assistance         General transfer comment: X2 from EOB with +2 to power up into standing; max cues for hand placement and positioning in preparation to stand; pt with minimal effort to assist first trial   Ambulation/Gait Ambulation/Gait assistance: Mod assist;+2 safety/equipment Ambulation Distance (Feet): 6 Feet Assistive device: Rolling walker (2 wheeled) Gait Pattern/deviations: Step-to pattern;Decreased stride length;Shuffle;Trunk flexed Gait velocity: slowed   General Gait Details: pt required mod A for balance and management of RW with cues for posture and  directional cues to not run into sink in room; pt began sitting without warning and reported she could not "go any farther" and with cues was able to stand up long enough to bring recliner behind pt   Stairs            Wheelchair Mobility    Modified Rankin (Stroke Patients Only)       Balance Overall balance assessment: Needs assistance Sitting-balance support: Bilateral upper extremity supported;Feet supported Sitting balance-Leahy Scale: Fair     Standing balance support: Bilateral upper extremity supported Standing balance-Leahy Scale: Poor Standing balance comment: requires RW assist                            Cognition Arousal/Alertness: Awake/alert Behavior During Therapy: Flat affect Overall Cognitive Status: Impaired/Different from baseline Area of Impairment: Safety/judgement;Problem solving;Attention;Following commands                   Current Attention Level: Selective   Following Commands: Follows one step commands with increased time Safety/Judgement: Decreased awareness of safety;Decreased awareness of deficits Awareness: Intellectual Problem Solving: Slow processing;Decreased initiation;Requires verbal cues;Requires tactile cues General Comments: minimal interaction from pt       Exercises      General Comments General comments (skin integrity, edema, etc.): dressing on L UE changed during session; VSS; cues for breathing through nose required as pt tends to begin hyperventilating when mobilizing      Pertinent Vitals/Pain Pain Assessment: Faces Faces Pain Scale: Hurts little more Pain Location: L UE with shoulder flexion Pain Descriptors / Indicators: Grimacing;Guarding Pain Intervention(s): Limited  activity within patient's tolerance;Monitored during session;Repositioned    Home Living                      Prior Function            PT Goals (current goals can now be found in the care plan section) Acute  Rehab PT Goals Patient Stated Goal: None stated PT Goal Formulation: With patient Time For Goal Achievement: 09/02/2016 Potential to Achieve Goals: Fair Progress towards PT goals: Progressing toward goals    Frequency    Min 3X/week      PT Plan Current plan remains appropriate    Co-evaluation              AM-PAC PT "6 Clicks" Daily Activity  Outcome Measure  Difficulty turning over in bed (including adjusting bedclothes, sheets and blankets)?: Unable Difficulty moving from lying on back to sitting on the side of the bed? : Unable Difficulty sitting down on and standing up from a chair with arms (e.g., wheelchair, bedside commode, etc,.)?: Unable Help needed moving to and from a bed to chair (including a wheelchair)?: A Lot Help needed walking in hospital room?: A Lot Help needed climbing 3-5 steps with a railing? : Total 6 Click Score: 8    End of Session Equipment Utilized During Treatment: Gait belt Activity Tolerance: Patient limited by fatigue Patient left: with call bell/phone within reach;in chair;with family/visitor present Nurse Communication: Mobility status PT Visit Diagnosis: Muscle weakness (generalized) (M62.81);Other abnormalities of gait and mobility (R26.89);Unsteadiness on feet (R26.81);Adult, failure to thrive (R62.7)     Time: 0836-0900 PT Time Calculation (min) (ACUTE ONLY): 24 min  Charges:  $Gait Training: 8-22 mins $Therapeutic Activity: 8-22 mins                    G Codes:       Earney Navy, PTA Pager: [phone]     Darliss Cheney 09/25/2016, 10:15 AM

## 2016-09-25 NOTE — Progress Notes (Signed)
Call back received from M. Lynch. Informed her that nurses are not allowed to insert coude cath on female patients and that she has to consult urology for insertion of coude cath.

## 2016-09-25 NOTE — Progress Notes (Signed)
Pt unable to urinate on bedpan. Bladder scan done. Noted 700+cc.Dr Hal Hope made aware with order to perform in and out cath which was done. Obtained 700 cc dark colored urine. Urine sample sent to lab.

## 2016-09-25 NOTE — Progress Notes (Signed)
Text paged Haley Graham. Informed that patient is retaining urine. Bladder scan 383 post void. Cath x3 by day shift RN unsuccessful. Awaiting response

## 2016-09-25 NOTE — Progress Notes (Signed)
PROGRESS NOTE    Haley Graham  ZHY:[id number] DOB: Jun 15, 1955 DOA: 09/07/2016 PCP: Glenda Chroman, MD (Confirm with patient/family/NH records and if not entered, this HAS to be entered at [facility] point of entry. 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 She presented with weakness and met sepsis criteria with concern for enteritis. Antibiotics started.   Patient had worsening Cr and Leukocytosis worsened and now improved. Complaining of Nausea and Abdominal Pain so will re-culture and repeat Imaging. Appears depressed and does not want to eat and just wants to lay in bed so will get Psych evaluation and place patient on Marinol. Discussed with Husband about Palliative Care Consult and he declined at this time. Patient refusing care and does not want to participate. Psych Evaluated and has adjusted medications. Patient also was found to have Haley Graham Left Pleural Effusion and underwent Thoracentesis and also was transfused 1 unit of pRBCs on 09/18/16.   This AM she was found to have Haley Graham with 1/2 Blood Cx growing Yeast so Fluconazole was D/C'd and patient changed to Eraxis. Infectious Diseases was consulted and recommended getting repeat Imaging of Graft as she was high risk of Seeding. Sensitivities of Yeast revealed Candidemia so Eraxis was changed back to Fluconazole.  Cefepime and Metronidazole were discontinued and Repeat Blood Cx showed NGTD at 2 days. Palliative Care met with the patient yesterday and today and Goals of Care to be discussed but patient not motivated and unwilling to  participate.  It has been extremely hard to motivate the patient to do anything and per nurse the patient has been refusing medications and Physical Therapy.   Plan for fluconazole to continue through 9/26 per ID.  She'll need an eye exam within 1 month.  Will need to discuss follow up imaging of graft and echo with ID.  We discussed cortrak for nutrition today on 8/23 which was placed on 8/24.  Plan to start feeds.  Retaining urine had I/O with ~700 cc last night, will order bladder scans and replace foley prn.    Assessment & Plan:   Principal Problem:   Candidemia ([place]) Active Problems:   GERD (gastroesophageal reflux disease)   Dissection of thoracoabdominal aorta (HCC)   AAA (abdominal aortic aneurysm) (HCC)   Benign essential HTN   Diabetes mellitus type 2 in obese (HCC)   Anxiety state   Hyponatremia   Leukocytosis   Sepsis (HCC)   Hypotension   AKI (acute kidney injury) ([place])   Dehydration   Generalized weakness   Anemia   Thrombocytopenia (HCC)   Abnormal computed tomography angiography (CTA) of abdomen and pelvis   Colitis: Probable   Protein-calorie malnutrition, severe   Palliative care by specialist   Adult failure to thrive   Hypophosphatemia   Hypomagnesemia   Sepsis, improving  -Suspected Secondary to colitis/enteritis but patient found to have Haley Graham Bacteremia with Yeast and grew out Haley Graham urine from 8/15 notably also grew out Haley Graham (09/16/16 - repeat blood culture from 8/19 NGTD x 4 days) - was started on fluconazole initially and briefly on eraxis, but switched back to fluconazole with Haley Graham to complete on 9/26 -WBC improved to normal from  20's -GI pathogen panel and C. Diff negative.  -Initial Urine/bloodculture negative. Repeat U/Cap Massi shows evidence of UTI with Many bacteria, Large Hb, Trace Leukocytes, Positive Nitrites, TNTC RBC, TNTC WBC -IV Flagyl and Cefepime now Discontinued per ID Recc's as she has had adequate  Treatment -Repeat CT Chest/Abd/Pelvis w/o Contrast done and showed Pleural Effusion -Continue to Monitor  -Will need reimaging of Graft as she is at high risk of seeding and likely repeat Limited ECHO --- I discussed this today with ID who noted she probably will not need reimaging given previous exams  _0  Will likely also need Opthamology Consult at some point for Dilated Opthalmologic exam and ID recommending within the next few weeks -Palliative Care Consulted and encouraged patient and will met to Discuss Code Status and patient refusing to participate in Discussion as she has no motivation; Remains FULL CODE  Candidemia/Yeast UTI -As above -Continue Fluconazole per ID Recc's  -reimaging of graft likely not needed per my discussion with Dr. Megan Salon today -Will also need Opthalmologic Evaluation at some point  -Infectious Disease has signed of  Hypotension -Improved with IV fluids which has made her anasarca worse. Troponin negative. Albumin extremely low. -Gave 1x dose of IV Albumin on 09/19/16 and additional today on 8/24 for hypotension with mild tachycardia, ctm  -monitor BP  Dehydration/Poor oral intake/FTT -encourage oral intake -dietitian consulted: protein supplementation as patient has Significant Hypoalbuminemia and LE swelling  -Will add Marinol to help appetite Stimulant; Increased to 5 mg po BID  -Albumin was 1.2  -Nutritionist states she has no other recommendations as it is very difficult to motivate the patient to even take po as she has been refusing  _1  cortrak placed, will start feeds per nutrition _2  monitor daily mag, phos, k for refeeding  Normocytic Anemia -Hb/Hct downtrending to 7.4 this morning, repeat this afternoon 7.7. We'll continue monitor. _3  Hemoccult stool -Type and Screen and Transfused 1 unit of pRBC on 8/17 -Has some Hematuria and dark urine, but recent UA from 8/24 without RBC's. -Repeat CBC in AM  COPD Stable. -continue  nebulizers  Acute Kidney Injury -  overall improved (1.06 today), peaked at 1.5,  -Suspected Secondary to hypotension and Hx of Atrophic Left Kidney -Continue to Monitor and Avoid Nephrotoxics -Repeat CMP in AM   Anasarca -Secondary to IV fluids. Recent echo significant for grade 1 diastolic dysfunction.Repeat echocardiogram significant for an EF of 65-70% with grade 1 diastolic dysfunction and trace mitral regurg and tricuspid regurg -Lasix was increased to 40 mg daily; Given IV Albumin on 09/20/16 and 8/24 -Albumin 1.2 -CT scan showed Small amount of abdominal and pelvic ascites. Diffuse body wall edema consistent with anasarca  Oliguria  Urinary retention - I think her decreased urine output represents more urinary retention rather than oliguria.  She had ~700 mL last night with Foley catheterization.   - bladder scans and place foley for retention -Improvement with diuresis -strict in/out (most appear unmeasured)   Hematuria  Proteinuria _4  repeat UA with 30 mg/dl protein and small hb on dipstick, but 0 RBC's -Tea colored urine. Decreased urine outputinitially which is now improved. Liver unremarkable for acute process. Vascular surgery without recommendations for thrombosis on consultation. -CT Scan showed Negative for nephrolithiasis, hydronephrosis or ureteral stone -May need to discuss with Urology -replacing foley due to urinary retention above  Hyponatremia - improved.  Likely secondary to third spacing. -Continue to Monitor and repeat CMP in AM   Elevated alkaline phosphatase  S/p cholecystecomy. GGT elevated. Alkaline phosphatase woresned from 345 -> 460 -> 344.  -RUQ unremarkable except for fatty liver -Continue to Monitor   Initial Abnormal CT angiogram of chest/abdomen/pelvis -Thrombus of left renal artery. Vascular surgery consulted with no recommendations for acute management. -Repeat CT Scan showed Displaced wall calcification in the infrarenal  abdominal aorta consistent with known thrombosed dissection.  Thrombocytopenia -Presumed secondary to acute illness.  -Platelet Count now 76 -Dr. Lonny Prude had discussed with hematologist, Dr. Julien Nordmann who recommends continued surveillance.  -Can follow-up as an outpatient if not improved by discharge. -Pathology smear review showed Leukocytosis with predominance of neutrophils with toxic features. Rare circulating myelocyte.  -Repat CBC in AM   Bruising: may be related to thrombocytopenia above, possibly nutrition status.  Will continue to monitor.   GERD Stable. -continue PPI  Depression/Anxiety -Discontinued Klonipin -Consulted Psychiatry Dr. Louretta Shorten and appreciated Assistance -Psychiatry changed Xanax 0.25 mg BID and discontinued Celexa; Patient was started on Cymbalta 40 mg po Daily for Depression  -C/w Marinol for Appetite  -Very difficult to Motivate and does not want to participate in Care _0  will need to touch base with psych again given severe depression  [place] type B aortic dissection -Patient is s/p stent graft placement on previous admission. -CT showed stable non contrasted appearance of the stent graft at the aortic arch and proximal descending thoracic aorta -Continue metoprolol at reduce dose  Tachycardia improved  irregular HR _1  yesterday morning HR seemed irregular (possibly ectopy or sinus arrhythmia), will obtain EKG and place on Tele -> tele sinus, EKG still pending -Rebound secondary to abrupt cessation of beta-blocker. -continue metoprolol at reduced dose of 12.24m BID  Leukocytosis -improved  -WBC went from 26.9 -> 5.0 -Inital Blood cultures negative and Repeat showed 1/2 Yeast.  Repeat from 8/19 is NGTD.  -Pro calcitonin 0.24 on admission. -Repeat Imaging showed Bilateral pleural effusions, small on the right and moderate on the left. Worsening consolidation in the lower lobes may reflect atelectasis or pneumonia. -Patient Currently on  fluconazole  Hypokalemia - improved today -Continue to Monitor and Replete as Necessary -Repeat CMP in AM  Hypophosphatemia, improving -pending today -d/c K phos will ctm -Continue to Monitor and Replete as Necessary -Repeat Phos Level in AM   Hypomagnesemia - pending -Continue to Monitor and Replete as Necessary -Repeat Mag Level in AM  Pleural Effusion on Left -U/S Guided Thoracentesis Ordered 09/19/17 -Resulted 0.65 mL of Fluid Removal -Repeat CXR 8/18 showed No change from previous day's study. 2. Persistent left lung base opacity consistent with combination atelectasis and Jaymen Fetch probable small effusion   DVT prophylaxis: SCD's for low platelets, consider pharm VTE ppx given improvement if improves Code Status: full  Family Communication:  Husband, daugther Disposition Plan: pending improvement in PO, functional status   Consultants:   ID, nutrition, psych, palliative care  Procedures: (Don't include imaging studies which can be auto populated. Include things that cannot be auto populated i.e. 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  Continued thrombosis of L renal artery with progressive atrophy of L  kidney.  Fatty infiltration of liver.  Wall enhancement of several loos of small bowel and large bowe (colitis or enteritis).  Interval development of linear filling defect in R external iliac atery (thrombus, dissection?) potentiall related to stent placement.   Antimicrobials: (specify start and planned stop date. Auto populated tables are space occupying and do not give end dates)  fluconazole    Subjective: Feeling tired.  Going to get the cortrak today.    Objective: Vitals:   09/24/16 2023 09/25/16 0642 09/25/16 1037 09/25/16 1144  BP: 129/61 126/60 103/61 (!) 115/53  Pulse: (!) 104 96 (!) 108 98  Resp: _0 Temp: 98.2 F (36.8 C) 97.7 F (36.5 C)  97.6 F (36.4 C)  TempSrc: Oral Oral  Oral  SpO2: 98% 100% 97% 95%  Weight:  70.5 kg (155 lb 6.4 oz)    Height:        Intake/Output Summary (Last 24 hours) at 09/25/16 1845 Last data filed at 09/25/16 1411  Gross per 24 hour  Intake              360 ml  Output              715 ml  Net             -355 ml   Filed Weights   09/23/16 0623 09/24/16 0548 09/25/16 0642  Weight: 69.8 kg (153 lb 14.4 oz) 76.6 kg (168 lb 14.4 oz) 70.5 kg (155 lb 6.4 oz)    Examination:  General exam: Appears calm and comfortable  Respiratory system: Clear to auscultation. Respiratory effort normal. Cardiovascular system: S1 & S2 heard, irregular. No JVD, murmurs, rubs, gallops or clicks. Gastrointestinal system: NG in place.  mild TTP, nondistended.  No rebound or guarding Central nervous system: Dimond Crotty&O, no focal deficits Extremities: 3+ LEE Skin: No rashes, lesions or ulcers.  She has diffuse scattered bruising. Psychiatry: depressed bruising.     Data Reviewed: I have personally reviewed following labs and imaging studies  CBC:  Recent Labs Lab 09/19/16 0403  09/21/16 0357 09/22/16 0433 09/23/16 0315 09/24/16 1214 09/25/16 0750 09/25/16 1210  WBC 14.8*  < > 8.0 5.3 5.0 7.6 6.7  --   NEUTROABS 12.6*  --  5.4 3.9 3.3  --    --   --   HGB 9.4*  < > 8.2* 8.1* 8.3* 9.3* 7.4* 7.7*  HCT 28.7*  < > 23.7* 24.6* 24.8* 28.8* 21.9* 23.8*  MCV 92.3  < > 95.6 92.5 93.9 94.7 98.2  --   PLT 64*  < > 72* 73* 72* 100* 85*  --   < > = values in this interval not displayed. Basic Metabolic Panel:  Recent Labs Lab 09/21/16 0357 09/22/16 0433 09/23/16 0315 09/23/16 1926 09/24/16 1214 09/25/16 0750  NA 133* 136 137 135 136 136  K 3.3* 2.8* 2.6* 3.5 3.9 3.4*  CL 112* 110 109 107 106 105  CO2 18* 21* 21* 22 21* 24  GLUCOSE 97 91 74 102* 104* 75  BUN 30* 27* 22* 21* 21* 20  CREATININE 1.31* 1.21* 1.07* 1.04* 1.16* 1.06*  CALCIUM 7.0* 7.0* 7.0* 6.8* 7.2* 7.0*  MG 1.6* 2.0 1.5*  --  1.7 1.5*  PHOS 2.4* 2.6 4.1  --  3.9 4.1   GFR: Estimated Creatinine Clearance: 53.1 mL/min ([place]) (by C-G formula based on SCr of 1.06 mg/dL (H)). Liver Function Tests:  Recent Labs Lab 09/21/16 0357 09/22/16 0433 09/23/16 0315 09/24/16 1214 09/25/16 0750  AST _0 ALT 9* 11* 11* 14 11*  ALKPHOS 353* 428* 445* 460* 344*  BILITOT 0.7 0.7 0.8 0.7 0.8  PROT 4.1* 4.0* 4.2* 4.7* 4.1*  ALBUMIN 1.2* 1.2* 1.2* 1.3* 1.2*   No results for input(s): LIPASE, AMYLASE in the last 168 hours. No results for input(s): AMMONIA in the last 168 hours. Coagulation Profile: No results for input(s): INR, PROTIME in the last 168 hours. Cardiac Enzymes: No results for input(s): CKTOTAL, CKMB, CKMBINDEX, TROPONINI in the last 168 hours. BNP (last 3 results) No results for input(s): PROBNP in the last 8760 hours. HbA1C: No results for input(s): HGBA1C in the last 72 hours. CBG:  Recent Labs Lab 09/24/16 1647 09/24/16 2148 09/25/16 0903 09/25/16 1141 09/25/16 1658  GLUCAP 97 102* 94 101* 97   Lipid Profile: No results for input(s): CHOL, HDL, LDLCALC, TRIG, CHOLHDL, LDLDIRECT in the last 72 hours. Thyroid Function Tests: No results for input(s): TSH, T4TOTAL, FREET4, T3FREE, THYROIDAB in the last 72 hours. Anemia Panel: No results  for input(s): VITAMINB12, FOLATE, FERRITIN, TIBC, IRON, RETICCTPCT in the last 72 hours. Sepsis Labs: No results for input(s): PROCALCITON, LATICACIDVEN in the last 168 hours.  Recent Results (from the past 240 hour(s))  Culture, Urine     Status: Abnormal   Collection Time: 09/16/16 10:49 AM  Result Value Ref Range Status   Specimen Description URINE, CLEAN CATCH  Final   Special Requests NONE  Final   Culture >=100,000 COLONIES/mL YEAST (Mika Anastasi)  Final   Report Status 09/17/2016 FINAL  Final  Culture, blood (routine x 2)     Status: None   Collection Time: 09/16/16  9:34 PM  Result Value Ref Range Status   Specimen Description BLOOD RIGHT HAND  Final   Special Requests IN PEDIATRIC BOTTLE Blood Culture adequate volume  Final   Culture NO GROWTH 5 DAYS  Final   Report Status 09/21/2016 FINAL  Final  Culture, blood (routine x 2)     Status: Abnormal   Collection Time: 09/16/16  9:34 PM  Result Value Ref Range Status   Specimen Description BLOOD RIGHT HAND  Final   Special Requests IN PEDIATRIC BOTTLE Blood Culture adequate volume  Final   Culture  Setup Time   Final    YEAST IN PEDIATRIC BOTTLE CRITICAL RESULT CALLED TO, READ BACK BY AND VERIFIED WITH: G.ABBOTT, PHARMD 09/19/16 0330 L.CHAMPION    Culture Haley Graham (Tawna Alwin)  Final   Report Status 09/20/2016 FINAL  Final  Gram stain     Status: None   Collection Time: 09/18/16 12:14 PM  Result Value Ref Range Status   Specimen Description PLEURAL LEFT  Final   Special Requests NONE  Final   Gram Stain   Final    MODERATE WBC PRESENT, PREDOMINANTLY PMN NO ORGANISMS SEEN    Report Status 09/18/2016 FINAL  Final  Culture, body fluid-bottle     Status: None   Collection Time: 09/18/16 12:14 PM  Result Value Ref Range Status   Specimen Description PLEURAL LEFT  Final   Special Requests NONE  Final   Culture NO GROWTH 5 DAYS  Final   Report Status 09/23/2016 FINAL  Final  Culture, blood (Routine X  2) w Reflex to ID Panel      Status: None   Collection Time: 09/20/16  3:01 PM  Result Value Ref Range Status   Specimen Description BLOOD BLOOD RIGHT HAND  Final   Special Requests IN PEDIATRIC BOTTLE Blood Culture adequate volume  Final   Culture NO GROWTH 5 DAYS  Final   Report Status 09/25/2016 FINAL  Final         Radiology Studies: Dg Abd Portable 1v  Result Date: 09/25/2016 CLINICAL DATA:  Feeding tube placement. EXAM: PORTABLE ABDOMEN - 1 VIEW COMPARISON:  CT scan of September 17, 2016. FINDINGS: Distal tip of feeding tube is seen in expected position of fourth portion of duodenum. Status post cholecystectomy. No abnormal bowel dilatation is noted. IMPRESSION: Distal tip of feeding tube is seen in expected position of fourth portion of duodenum. Electronically Signed   By: Marijo Conception, M.D.   On: 09/25/2016 14:20        Scheduled Meds: . ALPRAZolam  0.25 mg Oral BID  . ARIPiprazole  5 mg Oral Daily  . aspirin EC  81 mg Oral Daily  . dronabinol  5 mg Oral BID AC  . DULoxetine  40 mg Oral Daily  . feeding supplement (ENSURE ENLIVE)  237 mL Oral TID BM  . [START ON 09/26/2016] feeding supplement (PRO-STAT SUGAR FREE 64)  30 mL Per Tube Daily  . fluconazole  400 mg Oral Daily  . furosemide  40 mg Oral Daily  . insulin aspart  0-5 Units Subcutaneous QHS  . insulin aspart  0-9 Units Subcutaneous TID WC  . metoprolol tartrate  12.5 mg Oral BID  . pantoprazole  40 mg Oral QHS  . sodium chloride flush  3 mL Intravenous Q12H   Continuous Infusions: . feeding supplement (VITAL 1.5 CAL)       LOS: 16 days    Time spent: 35 minutes    Abbiegail Landgren Melven Sartorius, MD [facility] Pager [phone]  If 7PM-7AM, please contact night-coverage [email or web address] Password [facility] 09/25/2016, 6:45 PM

## 2016-09-25 NOTE — Progress Notes (Signed)
Attempted to insert foley catheter x3 nurses. Dr. Florene Glen made aware. Coude catheter ordered.  Will call coude team

## 2016-09-25 NOTE — Clinical Social Work Note (Signed)
CSW continues to follow for discharge needs. Patient now has a cortrack which is a barrier to SNF placement.  Dayton Scrape, [place]

## 2016-09-26 ENCOUNTER — Inpatient Hospital Stay (HOSPITAL_COMMUNITY): Payer: BLUE CROSS/BLUE SHIELD

## 2016-09-26 LAB — COMPREHENSIVE METABOLIC PANEL
ALBUMIN: 1.7 g/dL — AB (ref 3.5–5.0)
ALK PHOS: 329 U/L — AB (ref 38–126)
ALT: 11 U/L — ABNORMAL LOW (ref 14–54)
ANION GAP: 7 (ref 5–15)
AST: 17 U/L (ref 15–41)
BILIRUBIN TOTAL: 0.7 mg/dL (ref 0.3–1.2)
BUN: 15 mg/dL (ref 6–20)
CALCIUM: 7.2 mg/dL — AB (ref 8.9–10.3)
CO2: 24 mmol/L (ref 22–32)
Chloride: 105 mmol/L (ref 101–111)
Creatinine, Ser: 0.96 mg/dL (ref 0.44–1.00)
GFR calc Af Amer: >60 mL/min (ref >60)
GLUCOSE: 125 mg/dL — AB (ref 65–99)
Potassium: 3.2 mmol/L — ABNORMAL LOW (ref 3.5–5.1)
Sodium: 136 mmol/L (ref 135–145)
TOTAL PROTEIN: 4.4 g/dL — AB (ref 6.5–8.1)

## 2016-09-26 LAB — CBC
HCT: 22.8 % — ABNORMAL LOW (ref 36.0–46.0)
Hemoglobin: 7.3 g/dL — ABNORMAL LOW (ref 12.0–15.0)
MCH: 31.9 pg (ref 26.0–34.0)
MCHC: 32 g/dL (ref 30.0–36.0)
MCV: 99.6 fL (ref 78.0–100.0)
PLATELETS: 83 10*3/uL — AB (ref 150–400)
RBC: 2.29 MIL/uL — AB (ref 3.87–5.11)
RDW: 24.7 % — AB (ref 11.5–15.5)
WBC: 6.1 10*3/uL (ref 4.0–10.5)

## 2016-09-26 LAB — GLUCOSE, CAPILLARY
GLUCOSE-CAPILLARY: 151 mg/dL — AB (ref 65–99)
GLUCOSE-CAPILLARY: 156 mg/dL — AB (ref 65–99)
GLUCOSE-CAPILLARY: 147 mg/dL — AB (ref 65–99)

## 2016-09-26 LAB — PHOSPHORUS: Phosphorus: 3.7 mg/dL (ref 2.5–4.6)

## 2016-09-26 LAB — MAGNESIUM: MAGNESIUM: 1.9 mg/dL (ref 1.7–2.4)

## 2016-09-26 MED ORDER — POTASSIUM CHLORIDE CRYS ER 20 MEQ PO TBCR
40.0000 meq | EXTENDED_RELEASE_TABLET | Freq: Two times a day (BID) | ORAL | Status: AC
  Administered 2016-09-26 (×2): 40 meq via ORAL
  Filled 2016-09-26 (×2): qty 2

## 2016-09-26 MED ORDER — ALBUMIN HUMAN 5 % IV SOLN
25.0000 g | Freq: Once | INTRAVENOUS | Status: AC
  Administered 2016-09-26: 25 g via INTRAVENOUS
  Filled 2016-09-26: qty 500

## 2016-09-26 NOTE — Progress Notes (Signed)
Call back received from Walden Field NP. Informed by NP to try using a smaller size cath  per urology. Urology will see patient if nurse is unsuccessful with insertion of a smaller size cath. Patient and husband informed. Husband and patient ok with trying a pediatric size cath.

## 2016-09-26 NOTE — Progress Notes (Signed)
Tube feeding increased to 30 ml/hr per MD order.

## 2016-09-26 NOTE — Progress Notes (Signed)
Received patient with tube feeding infusing. Patient tolerating feeds.

## 2016-09-26 NOTE — Progress Notes (Signed)
Unable to insert smaller size cath because it too flexible. Will try again  using a 14 F.

## 2016-09-26 NOTE — Progress Notes (Signed)
PROGRESS NOTE    Haley Graham  VEL:[id number] DOB: February 13, 1955 DOA: 09/08/2016 PCP: Glenda Chroman, MD (Confirm with patient/family/NH records and if not entered, this HAS to be entered at [facility] point of entry. 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 She presented with weakness and met sepsis criteria with concern for enteritis. Antibiotics started.   Patient had worsening Cr and Leukocytosis worsened and now improved. Complaining of Nausea and Abdominal Pain so will re-culture and repeat Imaging. Appears depressed and does not want to eat and just wants to lay in bed so will get Psych evaluation and place patient on Marinol. Discussed with Husband about Palliative Care Consult and he declined at this time. Patient refusing care and does not want to participate. Psych Evaluated and has adjusted medications. Patient also was found to have Haley Graham Left Pleural Effusion and underwent Thoracentesis and also was transfused 1 unit of pRBCs on 09/18/16.   This AM she was found to have Haley Graham with 1/2 Blood Cx growing Yeast so Fluconazole was D/C'd and patient changed to Eraxis. Infectious Diseases was consulted and recommended getting repeat Imaging of Graft as she was high risk of Seeding. Sensitivities of Yeast revealed Candidemia so Eraxis was changed back to Fluconazole.  Cefepime and Metronidazole were discontinued and Repeat Blood Cx showed NGTD at 2 days. Palliative Care met with the patient yesterday and today and Goals of Care to be discussed but patient not motivated and unwilling to  participate.  It has been extremely hard to motivate the patient to do anything and per nurse the patient has been refusing medications and Physical Therapy.   Plan for fluconazole to continue through 9/26 per ID.  She'll need an eye exam within 1 month.  Will need to discuss follow up imaging of graft and echo with ID.  We discussed cortrak for nutrition today on 8/23 which was placed on 8/24.  Plan to start feeds.  She was having urinary retention on 8/24 and her foley was replaced.    Tube feeds started 8/24.  Notably tachy, but with normal WBC count and UA from 8/24 neg for nitrite and LE.  CXR for crackles, albumin given for tachy.  Low suspicion for infection.     Assessment & Plan:   Principal Problem:   Candidemia ([place]) Active Problems:   GERD (gastroesophageal reflux disease)   Dissection of thoracoabdominal aorta (HCC)   AAA (abdominal aortic aneurysm) (HCC)   Benign essential HTN   Diabetes mellitus type 2 in obese (HCC)   Anxiety state   Hyponatremia   Leukocytosis   Sepsis (HCC)   Hypotension   AKI (acute kidney injury) ([place])   Dehydration   Generalized weakness   Anemia   Thrombocytopenia (HCC)   Abnormal computed tomography angiography (CTA) of abdomen and pelvis   Colitis: Probable   Protein-calorie malnutrition, severe   Palliative care by specialist   Adult failure to thrive   Hypophosphatemia   Hypomagnesemia   Candida Albicans Bacteremia  Resolved sepsis -Initially suspected econdary to colitis/enteritis but patient found to have Haley Graham Bacteremia with Yeast and grew out Candida Albicans urine from 8/15 notably also grew out candida albicans (09/16/16 -  repeat blood culture from 8/19 NGTD x 5 days) - was started on fluconazole initially and briefly on eraxis, but switched back to fluconazole with candida albicans to complete on 9/26 - WBC normal today  -GI pathogen panel and C. Diff negative.  -Initial Urine/bloodculture negative. Repeat U/Haley Graham shows evidence  of UTI with Many bacteria, Large Hb, Trace Leukocytes, Positive Nitrites, TNTC RBC, TNTC WBC -IV Flagyl and Cefepime now Discontinued per ID Recc's as she has had adequate Treatment -Repeat CT Chest/Abd/Pelvis w/o Contrast done and showed Pleural Effusion -Continue to Monitor  -Will need reimaging of Graft as she is at high risk of seeding and likely repeat Limited ECHO  --- I discussed this 8/24 with ID (Dr. Megan Salon) who noted she probably will not need reimaging given previous exams  [ ] Will likely also need Opthamology Consult at some point for Dilated Opthalmologic exam and ID recommending within the next few weeks -[place] and encouraged patient and will met to Discuss Code Status and patient refusing to participate in Discussion as she has no motivation; Remains FULL CODE  Candidemia/Yeast UTI -As above -Continue Fluconazole per ID Recc's  -reimaging of graft likely not needed per my discussion with Dr. Megan Salon today -Will also need Opthalmologic Evaluation at some point  -Infectious Disease has signed of  Hypotension - improved -Improved with IV fluids which has made her anasarca worse. Troponin negative. Albumin extremely low. -Gave 1x dose of IV Albumin on 09/19/16 and additional today on 8/24 for hypotension with mild tachycardia, ctm.  Repeat albumin 8/25.  -monitor BP  Dehydration/Poor oral intake/FTT -encourage oral intake -dietitian consulted: protein supplementation as patient has Significant Hypoalbuminemia and LE swelling  -Will add Marinol to help appetite Stimulant; Increased to 5 mg po BID [ ] consider remeron in future, esp with mood -Albumin was 1.2  -Nutritionist states she has no other recommendations as it is very difficult to motivate the patient to even take po as she has been refusing  [x] cortrak placed (x ray with distal tip of feeding tube in 4th portion of duodenum), will start feeds per nutrition [ ] monitor daily mag, phos, k for  refeeding  Tachycardia irregular HR - Worse today, possibly due to intravascular depletion with low albumin, but this is different from past few days.  BP normal today.  She has Carrol Bondar normal WBC count and is without fevers. - hold lasix and give albumin, f/u response to albumin EKG appears similar to prior, isolated q in lead III? Repeat AM EKG. -Rebound secondary to abrupt cessation of beta-blocker. -continue metoprolol at reduced dose of 12.35m BID  Crackles: R lung base, on room air, suspect atelectasis.  CXR.   Normocytic Anemia -Hb/Hct downtrending to 7.4 this morning, repeat this afternoon 7.7. We'll continue monitor. [ ] Hemoccult stool (pending) -> notably positive in June -Type and Screen and Transfused 1 unit of pRBC on 8/17 -Has some Hematuria and dark urine, but recent UA from 8/24 without RBC's. -Repeat CBC in AM  COPD Stable. -continue nebulizers  Acute Kidney Injury -  overall improved (0.96), peaked at 1.5,  -Suspected Secondary to hypotension and Hx of Atrophic Left Kidney -Continue to Monitor and Avoid Nephrotoxics -Repeat CMP in AM   Anasarca  -Secondary to IV fluids. Recent echo significant for grade 1 diastolic dysfunction.Repeat echocardiogram significant for an EF of 65-70% with grade 1 diastolic dysfunction and trace mitral regurg and tricuspid regurg -Lasix was increased to 40 mg daily; Given IV Albumin on  09/20/16 and 8/24 and 8/25 -Albumin 1.7 -CT scan showed Small amount of abdominal and pelvic ascites. Diffuse body wall edema consistent with anasarca  Oliguria  Urinary retention - I think her decreased urine output represents more urinary retention rather than oliguria.  She had ~700 mL 8/24 with Foley catheterization and >300 post void last night so foley was placed.   - bladder scans and place foley for retention -strict in/out (most appear unmeasured)   Hematuria  Proteinuria [ ] repeat UA with 30 mg/dl protein and small hb on dipstick, but 0  RBC's -Tea colored urine. Decreased urine outputinitially which is now improved. Liver unremarkable for acute process. Vascular surgery without recommendations for thrombosis on consultation. -CT Scan showed Negative for nephrolithiasis, hydronephrosis or ureteral stone -May need to discuss with Urology -replacing foley due to urinary retention above  Hyponatremia - improved.  Likely secondary to third spacing. -Continue to Monitor and repeat CMP in AM   Elevated alkaline phosphatase S/p cholecystecomy. GGT elevated. Alkaline phosphatase woresned from 345 -> 460 -> 329.  -RUQ unremarkable except for fatty liver -Continue to Monitor   Initial Abnormal CT angiogram of chest/abdomen/pelvis -Thrombus of left renal artery. Vascular surgery consulted with no recommendations for acute management. -Repeat CT Scan showed Displaced wall calcification in the infrarenal abdominal aorta consistent with known thrombosed dissection.  Thrombocytopenia -Presumed secondary to acute illness.  -Platelet Count now 2 -Dr. Lonny Prude had discussed with hematologist, Dr. Julien Nordmann who recommends continued surveillance.  -Can follow-up as an outpatient if not improved by discharge. -Pathology smear review showed Leukocytosis with predominance of neutrophils with toxic features. Rare circulating myelocyte.  -Repat CBC in AM   Bruising: may be related to thrombocytopenia above, possibly nutrition status.  Will continue to monitor.   GERD Stable. -continue PPI  Nausea  Abdominal discomfort:  Nausea Tangala Wiegert bit worse today, but abdominal discomfort seems at baseline.  Maybe due to feeds? Vs IBS and gastroparesis.  - Ctm  Depression/Anxiety -Discontinued Klonipin -Consulted Psychiatry Dr. Louretta Shorten and appreciated Assistance -Psychiatry changed Xanax 0.25 mg BID and discontinued Celexa; Patient was started on Cymbalta 40 mg po Daily for Depression  -C/w Marinol for Appetite  -Very difficult to Motivate  and does not want to participate in Care [ ] Consider remeron as above [ ] will need to touch base with psych again given severe depression, plan for Monday  [place] type B aortic dissection -Patient is s/p stent graft placement on previous admission. -CT showed stable non contrasted appearance of the stent graft at the aortic arch and proximal descending thoracic aorta -Continue metoprolol at reduce dose  Leukocytosis -improved  -Inital Blood cultures negative and Repeat showed 1/2 Yeast.  Repeat from 8/19 is NGTD.  -Pro calcitonin 0.24 on admission. -Repeat Imaging showed Bilateral pleural effusions, small on the right and moderate on the left. Worsening consolidation in the lower lobes may reflect atelectasis or pneumonia. -Patient Currently on fluconazole  Hypokalemia - improved today -Continue to Monitor and Replete as Necessary -Repeat CMP in AM  Hypophosphatemia, improving -d/c K phos will ctm -Continue to Monitor and Replete as Necessary -Repeat Phos Level in AM   Hypomagnesemia -Continue to Monitor and Replete as Necessary -Repeat Mag Level in AM  Pleural Effusion on Left -U/S Guided Thoracentesis Ordered 09/19/17 -Resulted 0.65 mL of Fluid Removal -Repeat CXR 8/18 showed No change from previous day's study. 2. Persistent left lung base opacity consistent with combination atelectasis and Chinmayi Rumer probable small effusion   DVT prophylaxis: SCD's  for low platelets, consider pharm VTE ppx if improvement Code Status: full  Family Communication:  Husband, daugther Disposition Plan: pending improvement in PO, functional status   Consultants:   ID, nutrition, psych, palliative care  Procedures: (Don't include imaging studies which can be auto populated. Include things that cannot be auto populated i.e. 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  Continued thrombosis of L renal artery with progressive atrophy of L kidney.  Fatty infiltration of liver.  Wall enhancement of several loos of small bowel and large bowe (colitis or enteritis).  Interval development of linear filling defect in R external iliac atery (thrombus, dissection?) potentiall related to stent placement.   Antimicrobials: (specify start and planned stop date. Auto populated tables are space occupying and do not give end dates)  fluconazole    Subjective: Nausea.  No CP or SOB or fevers.  Still abdominal discomfort.   Objective: Vitals:   09/25/16 1144 09/25/16 2050 09/26/16 0558 09/26/16 1128  BP: (!) 115/53 (!) 138/59 138/72 126/76  Pulse: 98 (!) 101 (!) 108 (!) 121  Resp: _0 Temp: 97.6 F (36.4 C) 98.1 F (36.7 C) 98 F (36.7 C) 98.4 F (36.9 C)  TempSrc: Oral Oral Oral Oral  SpO2: 95% 96% 95% 97%  Weight:   70.8 kg (156 lb)   Height:        Intake/Output Summary (Last 24 hours) at 09/26/16 1349 Last data filed at 09/26/16 1200  Gross per 24 hour  Intake              180 ml  Output              850 ml  Net             -670 ml   Filed Weights   09/24/16 0548 09/25/16 0642 09/26/16 0558  Weight: 76.6 kg (168 lb 14.4 oz) 70.5 kg (155 lb 6.4 oz) 70.8 kg (156 lb)    Examination:  General: No  acute distress, appears tired  Cardiovascular: Heart sounds show Leanza Shepperson tachycardic rate, and rhythm. No gallops or rubs. No murmurs. No JVD. Lungs: Crackles to R base. Abdomen: Soft, mildly ttp diffusely. No masses. No hepatosplenomegaly. Neurological: Alert and oriented 3. Moves all extremities 4 with equal strength. Cranial nerves II through XII grossly intact. Skin: Warm and dry. Diffuse ecchymoses. Extremities: No clubbing or cyanosis. 3+ edema, anasarca. Pedal pulses 2+. Psychiatric: depressed mood  Data Reviewed: I have personally reviewed following labs and imaging studies  CBC:  Recent Labs Lab 09/21/16 0357 09/22/16 0433 09/23/16 0315 09/24/16 1214 09/25/16 0750 09/25/16 1210 09/26/16 0451  WBC 8.0 5.3 5.0 7.6 6.7  --  6.1  NEUTROABS 5.4 3.9 3.3  --   --   --   --   HGB 8.2* 8.1*  8.3* 9.3* 7.4* 7.7* 7.3*  HCT 23.7* 24.6* 24.8* 28.8* 21.9* 23.8* 22.8*  MCV 95.6 92.5 93.9 94.7 98.2  --  99.6  PLT 72* 73* 72* 100* 85*  --  83*   Basic Metabolic Panel:  Recent Labs Lab 09/22/16 0433 09/23/16 0315 09/23/16 1926 09/24/16 1214 09/25/16 0750 09/26/16 0451  NA 136 137 135 136 136 136  K 2.8* 2.6* 3.5 3.9 3.4* 3.2*  CL 110 109 107 106 105 105  CO2 21* 21* 22 21* 24 24  GLUCOSE 91 74 102* 104* 75 125*  BUN 27* 22* 21* 21* 20 15  CREATININE 1.21* 1.07* 1.04* 1.16* 1.06* 0.96  CALCIUM 7.0* 7.0* 6.8* 7.2* 7.0* 7.2*  MG 2.0 1.5*  --  1.7 1.5* 1.9  PHOS 2.6 4.1  --  3.9 4.1 3.7   GFR: Estimated Creatinine Clearance: 58.8 mL/min (by C-G formula based on SCr of 0.96 mg/dL). Liver Function Tests:  Recent Labs Lab 09/22/16 0433 09/23/16 0315 09/24/16 1214 09/25/16 0750 09/26/16 0451  AST _0 ALT 11* 11* 14 11* 11*  ALKPHOS 428* 445* 460* 344* 329*  BILITOT 0.7 0.8 0.7 0.8 0.7  PROT 4.0* 4.2* 4.7* 4.1* 4.4*  ALBUMIN 1.2* 1.2* 1.3* 1.2* 1.7*   No results for input(s): LIPASE, AMYLASE in the last 168 hours. No results for input(s): AMMONIA in the last  168 hours. Coagulation Profile: No results for input(s): INR, PROTIME in the last 168 hours. Cardiac Enzymes: No results for input(s): CKTOTAL, CKMB, CKMBINDEX, TROPONINI in the last 168 hours. BNP (last 3 results) No results for input(s): PROBNP in the last 8760 hours. HbA1C: No results for input(s): HGBA1C in the last 72 hours. CBG:  Recent Labs Lab 09/25/16 1141 09/25/16 1658 09/25/16 2217 09/26/16 0743 09/26/16 1157  GLUCAP 101* 97 102* 151* 156*   Lipid Profile: No results for input(s): CHOL, HDL, LDLCALC, TRIG, CHOLHDL, LDLDIRECT in the last 72 hours. Thyroid Function Tests: No results for input(s): TSH, T4TOTAL, FREET4, T3FREE, THYROIDAB in the last 72 hours. Anemia Panel: No results for input(s): VITAMINB12, FOLATE, FERRITIN, TIBC, IRON, RETICCTPCT in the last 72 hours. Sepsis Labs: No results for input(s): PROCALCITON, LATICACIDVEN in the last 168 hours.  Recent Results (from the past 240 hour(s))  Culture, blood (routine x 2)     Status: None   Collection Time: 09/16/16  9:34 PM  Result Value Ref Range Status   Specimen Description BLOOD RIGHT HAND  Final   Special Requests IN PEDIATRIC BOTTLE Blood Culture adequate volume  Final   Culture NO GROWTH 5 DAYS  Final   Report Status 09/21/2016 FINAL  Final  Culture, blood (routine x 2)     Status: Abnormal   Collection Time: 09/16/16  9:34 PM  Result Value Ref Range Status   Specimen Description BLOOD RIGHT HAND  Final   Special Requests IN PEDIATRIC BOTTLE Blood Culture adequate volume  Final   Culture  Setup Time   Final    YEAST IN PEDIATRIC BOTTLE CRITICAL RESULT CALLED TO, READ BACK BY AND VERIFIED WITH: G.ABBOTT, PHARMD 09/19/16 0330 L.CHAMPION    Culture CANDIDA ALBICANS (Ivannia Willhelm)  Final   Report Status 09/20/2016 FINAL  Final  Gram stain     Status: None   Collection Time: 09/18/16 12:14 PM  Result Value Ref Range Status   Specimen Description PLEURAL LEFT  Final   Special Requests NONE  Final   Gram Stain    Final    MODERATE  WBC PRESENT, PREDOMINANTLY PMN NO ORGANISMS SEEN    Report Status 09/18/2016 FINAL  Final  Culture, body fluid-bottle     Status: None   Collection Time: 09/18/16 12:14 PM  Result Value Ref Range Status   Specimen Description PLEURAL LEFT  Final   Special Requests NONE  Final   Culture NO GROWTH 5 DAYS  Final   Report Status 09/23/2016 FINAL  Final  Culture, blood (Routine X 2) w Reflex to ID Panel     Status: None   Collection Time: 09/20/16  3:01 PM  Result Value Ref Range Status   Specimen Description BLOOD BLOOD RIGHT HAND  Final   Special Requests IN PEDIATRIC BOTTLE Blood Culture adequate volume  Final   Culture NO GROWTH 5 DAYS  Final   Report Status 09/25/2016 FINAL  Final         Radiology Studies: Dg Abd Portable 1v  Result Date: 09/25/2016 CLINICAL DATA:  Feeding tube placement. EXAM: PORTABLE ABDOMEN - 1 VIEW COMPARISON:  CT scan of September 17, 2016. FINDINGS: Distal tip of feeding tube is seen in expected position of fourth portion of duodenum. Status post cholecystectomy. No abnormal bowel dilatation is noted. IMPRESSION: Distal tip of feeding tube is seen in expected position of fourth portion of duodenum. Electronically Signed   By: Marijo Conception, M.D.   On: 09/25/2016 14:20        Scheduled Meds: . ALPRAZolam  0.25 mg Oral BID  . ARIPiprazole  5 mg Oral Daily  . aspirin EC  81 mg Oral Daily  . dronabinol  5 mg Oral BID AC  . DULoxetine  40 mg Oral Daily  . feeding supplement (ENSURE ENLIVE)  237 mL Oral TID BM  . feeding supplement (PRO-STAT SUGAR FREE 64)  30 mL Per Tube Daily  . fluconazole  400 mg Oral Daily  . insulin aspart  0-5 Units Subcutaneous QHS  . insulin aspart  0-9 Units Subcutaneous TID WC  . lidocaine  1 application Urethral Once  . metoprolol tartrate  12.5 mg Oral BID  . pantoprazole  40 mg Oral QHS  . potassium chloride  40 mEq Oral BID  . sodium chloride flush  3 mL Intravenous Q12H   Continuous  Infusions: . albumin human    . feeding supplement (VITAL 1.5 CAL) 1,000 mL (09/26/16 0332)     LOS: 17 days    Time spent: 35 minutes    Portia Wisdom Melven Sartorius, MD [facility] Pager [phone]  If 7PM-7AM, please contact night-coverage [email or web address] Password [email or web address] 09/26/2016, 1:49 PM

## 2016-09-27 ENCOUNTER — Inpatient Hospital Stay (HOSPITAL_COMMUNITY): Payer: BLUE CROSS/BLUE SHIELD

## 2016-09-27 LAB — GLUCOSE, CAPILLARY
GLUCOSE-CAPILLARY: 128 mg/dL — AB (ref 65–99)
GLUCOSE-CAPILLARY: 115 mg/dL — AB (ref 65–99)
Glucose-Capillary: 126 mg/dL — ABNORMAL HIGH (ref 65–99)
Glucose-Capillary: 149 mg/dL — ABNORMAL HIGH (ref 65–99)

## 2016-09-27 LAB — COMPREHENSIVE METABOLIC PANEL WITH GFR
ALT: 11 U/L — ABNORMAL LOW (ref 14–54)
AST: 21 U/L (ref 15–41)
Albumin: 2.1 g/dL — ABNORMAL LOW (ref 3.5–5.0)
Alkaline Phosphatase: 488 U/L — ABNORMAL HIGH (ref 38–126)
Anion gap: 7 (ref 5–15)
BUN: 12 mg/dL (ref 6–20)
CO2: 24 mmol/L (ref 22–32)
Calcium: 7.9 mg/dL — ABNORMAL LOW (ref 8.9–10.3)
Chloride: 107 mmol/L (ref 101–111)
Creatinine, Ser: 0.87 mg/dL (ref 0.44–1.00)
GFR calc Af Amer: >60 mL/min
GFR calc non Af Amer: >60 mL/min
Glucose, Bld: 133 mg/dL — ABNORMAL HIGH (ref 65–99)
Potassium: 3.5 mmol/L (ref 3.5–5.1)
Sodium: 138 mmol/L (ref 135–145)
Total Bilirubin: 1 mg/dL (ref 0.3–1.2)
Total Protein: 5.2 g/dL — ABNORMAL LOW (ref 6.5–8.1)

## 2016-09-27 LAB — MAGNESIUM: Magnesium: 1.6 mg/dL — ABNORMAL LOW (ref 1.7–2.4)

## 2016-09-27 LAB — CBC
HEMATOCRIT: 24.9 % — AB (ref 36.0–46.0)
HEMOGLOBIN: 8.3 g/dL — AB (ref 12.0–15.0)
MCH: 34 pg (ref 26.0–34.0)
MCHC: 33.3 g/dL (ref 30.0–36.0)
MCV: 102 fL — AB (ref 78.0–100.0)
Platelets: 98 10*3/uL — ABNORMAL LOW (ref 150–400)
RBC: 2.44 MIL/uL — ABNORMAL LOW (ref 3.87–5.11)
RDW: 25.2 % — ABNORMAL HIGH (ref 11.5–15.5)
WBC: 7.1 10*3/uL (ref 4.0–10.5)

## 2016-09-27 LAB — PHOSPHORUS: Phosphorus: 3.3 mg/dL (ref 2.5–4.6)

## 2016-09-27 MED ORDER — SODIUM CHLORIDE 0.9 % IV SOLN
INTRAVENOUS | Status: AC
  Administered 2016-09-28: 01:00:00 via INTRAVENOUS

## 2016-09-27 MED ORDER — SODIUM CHLORIDE 0.9 % IV SOLN
INTRAVENOUS | Status: DC
  Administered 2016-09-27: 13:00:00 via INTRAVENOUS

## 2016-09-27 MED ORDER — SODIUM CHLORIDE 0.9 % IV BOLUS (SEPSIS)
1000.0000 mL | Freq: Once | INTRAVENOUS | Status: AC
  Administered 2016-09-27: 1000 mL via INTRAVENOUS

## 2016-09-27 MED ORDER — BISACODYL 10 MG RE SUPP
10.0000 mg | Freq: Once | RECTAL | Status: AC
Start: 2016-09-27 — End: 2016-09-27
  Administered 2016-09-27: 10 mg via RECTAL
  Filled 2016-09-27: qty 1

## 2016-09-27 MED ORDER — PROMETHAZINE HCL 25 MG/ML IJ SOLN
12.5000 mg | Freq: Four times a day (QID) | INTRAMUSCULAR | Status: DC | PRN
  Administered 2016-09-27 – 2016-09-28 (×4): 12.5 mg via INTRAVENOUS
  Filled 2016-09-27 (×4): qty 1

## 2016-09-27 MED ORDER — SODIUM CHLORIDE 0.9 % IV SOLN
250.0000 mg | Freq: Three times a day (TID) | INTRAVENOUS | Status: DC
  Filled 2016-09-27 (×2): qty 5

## 2016-09-27 MED ORDER — HEPARIN SODIUM (PORCINE) 5000 UNIT/ML IJ SOLN
5000.0000 [IU] | Freq: Three times a day (TID) | INTRAMUSCULAR | Status: DC
  Administered 2016-09-27 – 2016-09-30 (×7): 5000 [IU] via SUBCUTANEOUS
  Filled 2016-09-27 (×9): qty 1

## 2016-09-27 MED ORDER — MAGNESIUM SULFATE 2 GM/50ML IV SOLN
2.0000 g | Freq: Once | INTRAVENOUS | Status: AC
  Administered 2016-09-27: 2 g via INTRAVENOUS
  Filled 2016-09-27: qty 50

## 2016-09-27 MED ORDER — VITAL 1.5 CAL PO LIQD
1000.0000 mL | ORAL | Status: DC
  Administered 2016-09-27: 1000 mL
  Filled 2016-09-27 (×5): qty 1000

## 2016-09-27 MED ORDER — ERYTHROMYCIN ETHYLSUCCINATE 400 MG/5ML PO SUSR
250.0000 mg | Freq: Three times a day (TID) | ORAL | Status: DC
  Administered 2016-09-27 – 2016-09-29 (×5): 250 mg via ORAL
  Filled 2016-09-27 (×6): qty 3.1

## 2016-09-27 MED ORDER — IOPAMIDOL (ISOVUE-370) INJECTION 76%
INTRAVENOUS | Status: AC
  Administered 2016-09-27: 100 mL
  Filled 2016-09-27: qty 100

## 2016-09-27 NOTE — Progress Notes (Signed)
Paged on call hospitalist about brown emesis, plans to wait until physicians see her in the morning and hold the tube feedings. Asked rapid RN to assess patient, recommends abd [place] or KUB if vomiting gets worse/prolonged.

## 2016-09-27 NOTE — Progress Notes (Signed)
PROGRESS NOTE    Haley Graham  UJW:[id number] DOB: [birth year] DOA: 09/23/2016 PCP: Glenda Chroman, MD (Confirm with patient/family/NH records and if not entered, this HAS to be entered at [facility] point of entry. 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 She presented with weakness and met sepsis criteria with concern for enteritis. Antibiotics started.   Patient had worsening Cr and Leukocytosis worsened and now improved. Complaining of Nausea and Abdominal Pain so will re-culture and repeat Imaging. Appears depressed and does not want to eat and just wants to lay in bed so will get Psych evaluation and place patient on Marinol. Discussed with Husband about Palliative Care Consult and he declined at this time. Patient refusing care and does not want to participate. Psych Evaluated and has adjusted medications. Patient also was found to have Haley Graham Left Pleural Effusion and underwent Thoracentesis and also was transfused 1 unit of pRBCs on 09/18/16.   This AM she was found to have Haley Madl Fungemia with 1/2 Blood Cx growing Yeast so Fluconazole was D/C'd and patient changed to Eraxis. Infectious Diseases was consulted and recommended getting repeat Imaging of Graft as she was high risk of Seeding. Sensitivities of Yeast revealed Candidemia so Eraxis was changed back to Fluconazole.  Cefepime and Metronidazole were discontinued and Repeat Blood Cx showed NGTD at 2 days. Palliative Care met with the patient yesterday and today and Goals of Care to be discussed but patient not motivated and unwilling to  participate.  It has been extremely hard to motivate the patient to do anything and per nurse the patient has been refusing medications and Physical Therapy.   Plan for fluconazole to continue through 9/26 per ID.  She'll need an eye exam within 1 month.  Will need to discuss follow up imaging of graft and echo with ID.  We discussed cortrak for nutrition today on 8/23 which was placed on 8/24.  Plan to start feeds.  She was having urinary retention on 8/24 and her foley was replaced.    Tube feeds started 8/24.  Notably tachy, but with normal WBC count and UA from 8/24 neg for nitrite and LE.  CXR for crackles, albumin given for tachy.  Low suspicion for infection.    Pt did not do well with feeds overnight.  D/c 8/26 with increased rate.  Tachycardia has is still present.    Assessment & Plan:   Principal Problem:   Candidemia ([place]) Active Problems:   GERD (gastroesophageal reflux disease)   Dissection of thoracoabdominal aorta (HCC)   AAA (abdominal aortic aneurysm) (HCC)   Benign essential HTN   Diabetes mellitus type 2 in obese (HCC)   Anxiety state   Hyponatremia   Leukocytosis   Sepsis (HCC)   Hypotension   AKI (acute kidney injury) ([place])   Dehydration   Generalized weakness   Anemia   Thrombocytopenia (HCC)   Abnormal computed tomography angiography (CTA) of abdomen and pelvis   Colitis: Probable   Protein-calorie malnutrition, severe   Palliative care by specialist   Adult failure to thrive   Hypophosphatemia   Hypomagnesemia   Candida Albicans Bacteremia  Resolved sepsis -Initially suspected econdary to colitis/enteritis but patient **Note De-Identified vi Obfusction** found to hve  Bcteremi with Yest nd grew out Cndid lbicns urine from 8/15 notbly lso grew out cndid lbicns (09/16/16 - repet blood culture from 8/19 NGTD x 5 dys) - ws strted on fluconzole initilly nd briefly on erxis, but switched bck to fluconzole with cndid lbicns to complete on 9/26 - WBC norml tody   -GI pthogen pnel nd C. Diff negtive.  -Initil Urine/bloodculture negtive. Repet U/ shows evidence of UTI with Mny bcteri, Lrge Hb, Trce Leukocytes, Positive Nitrites, TNTC RBC, TNTC WBC -IV Flgyl nd Cefepime now Discontinued per ID Recc's s she hs hd dequte Tretment -Repet CT Chest/bd/Pelvis w/o Contrst done nd showed Pleurl Effusion -Continue to Monitor  - Initilly thought she would need reimging of Grft s she is t high risk of seeding nd likely repet Limited ECHO  --- I discussed this 8/24 with ID (Dr. Megn Slon) who noted she probbly will not need reimging given previous exms  [ ] Will likely lso need Opthmology Consult t some point for Dilted Opthlmologic exm nd ID recommending within the next few weeks -Dover nd encourged ptient nd will met to Discuss Code Sttus nd ptient refusing to prticipte in Discussion s she hs no motivtion; Remins FULL CODE  Cndidemi/Yest UTI -s bove -Continue Fluconzole per ID Recc's  -reimging of grft likely not needed per my discussion with Dr. Megn Slon tody -Will lso need Opthlmologic Evlution t some point  -Infectious Disese hs signed of  Hypotension - improved -Improved with IV fluids which hs mde her nsrc worse. Troponin negtive. lbumin extremely low. -Gve 1x dose of IV lbumin on 09/19/16 nd dditionl tody on 8/24 for hypotension with mild tchycrdi, ctm.  Repet lbumin 8/25.  -monitor BP  Tchycrdi - Persistent tody.  Initilly thought due to intrvsculr depletion, but no rel improvement with lbumin given yesterdy nd dy prior.  Will give 1 L NS.  No CP nd is on room ir, but PE is on ddx nd DVT ppx held for SCDs in setting of thrombocytopeni.  If no improvement with NS will get CT to r/o PE.   BP norml tody.  She hs  norml WBC count nd is without fevers. - hold lsix nd give lbumin, f/u response to lbumin EKG ppers similr  to prior, isolted q in led III? Repet M EKG similr, ctm. - ws thought to hve rebound erlier secondry to brupt cesstion of bet-blocker. - continue metoprolol t reduced dose of 12.68m BID  Dehydrtion/Poor orl intke/FTT  Cortrk in plce:  Did poorly with feeds overnight, fter incresed to 40 ml/hr.  Hd n/v.  It ws d/c'd.  Discussed with nutrition nd plnning to try to resume gin t 20.  -encourge orl intke -dietitin consulted: protein supplementtion s ptient hs Significnt Hypolbuminemi nd LE swelling  -Will dd Mrinol to help ppetite Stimulnt; Incresed to 5 mg po BID [ ] consider remeron in future, esp with mood -lbumin ws 1.2 ->  Up to 2.1 with boluses [ ] will consider dding erythromycin (poorly tolernt of regln in pst - tchycrdi) [x] cortrk plced (x ry with distl tip of feeding tube in 4th portion of duodenum), will strt feeds per nutrition [ ] monitor dily mg, phos, k for refeeding  Crckles: R lung bse, on room ir, suspect telectsis.  CXR with bibsilr telectsis (vs infiltrtes) L>R.  lso possible smll L pleurl effusion (seen previously).   Normocytic nemi -stbly low, 8.3 [ ] Hemoccult stool (pending) -> notbly positive in  June -Type and Screen and Transfused 1 unit of pRBC on 8/17 -Has some Hematuria and dark urine, but recent UA from 8/24 without RBC's. -Repeat CBC in AM  COPD Stable. -continue nebulizers  Acute Kidney Injury -  overall improved (0.96), peaked at 1.5,  -Suspected Secondary to hypotension and Hx of Atrophic Left Kidney -Continue to Monitor and Avoid Nephrotoxics -Repeat CMP in AM   Anasarca  -Secondary to IV fluids. Recent echo significant for grade 1 diastolic dysfunction.Repeat echocardiogram significant for an EF of 65-70% with grade 1 diastolic dysfunction and trace mitral regurg and tricuspid regurg - holding lasix with tachycardia (previously had been increased to 40 mg daily);  Given IV Albumin on 09/20/16 and 8/24 and 8/25 -Albumin 2.1 -CT scan showed Small amount of abdominal and pelvic ascites. Diffuse body wall edema consistent with anasarca  Oliguria?  Urinary retention - I think her decreased urine output represents more urinary retention rather than oliguria, but only 300 cc noted in past 24 hours (will dbl check with nursing). She had ~700 mL 8/24 with Foley catheterization and >300 post void the next day so foley was placed.   - bladder scans and place foley for retention [ ] consider removing foley after 48-72 hours Normal renal function -strict in/out (most appear unmeasured)   Hematuria  Proteinuria [ ] repeat UA with 30 mg/dl protein and small hb on dipstick, but 0 RBC's -Tea colored urine. Decreased urine outputinitially which is now improved. Liver unremarkable for acute process. Vascular surgery without recommendations for thrombosis on consultation. -CT Scan showed Negative for nephrolithiasis, hydronephrosis or ureteral stone -May need to discuss with Urology -replacing foley due to urinary retention above  Hyponatremia - improved.  Likely secondary to third spacing. -Continue to Monitor and repeat CMP in AM   Elevated alkaline phosphatase S/p cholecystecomy. GGT elevated. Alkaline phosphatase woresned from 345 -> 460 -> 329 -> 488.  -RUQ unremarkable except for fatty liver -Continue to Monitor   Initial Abnormal CT angiogram of chest/abdomen/pelvis -Thrombus of left renal artery. Vascular surgery consulted with no recommendations for acute management. -Repeat CT Scan showed Displaced wall calcification in the infrarenal abdominal aorta consistent with known thrombosed dissection.  Thrombocytopenia -Presumed secondary to acute illness.  -Platelet Count now 38 -Dr. Lonny Prude had discussed with hematologist, Dr. Julien Nordmann who recommends continued surveillance.  -Can follow-up as an outpatient if not improved by discharge. -Pathology  smear review showed Leukocytosis with predominance of neutrophils with toxic features. Rare circulating myelocyte.  -Repat CBC in AM   Bruising: may be related to thrombocytopenia above, possibly nutrition status.  Will continue to monitor.   GERD Stable. -continue PPI  Nausea  Abdominal discomfort:  Nausea with feeds.  Vomiting.  abdominal discomfort seems at baseline.  This started after feeds started.  Will slow down rate. - Ctm  Depression/Anxiety -Discontinued Klonipin -Consulted Psychiatry Dr. Louretta Shorten and appreciated Assistance -Psychiatry changed Xanax 0.25 mg BID and discontinued Celexa; Patient was started on Cymbalta 40 mg po Daily for Depression  -C/w Marinol for Appetite  -Very difficult to Motivate and does not want to participate in Care [ ] Consider remeron as above [ ] will need to touch base with psych again given severe depression, plan for Monday  [place] type B aortic dissection -Patient is s/p stent graft placement on previous admission. -CT showed stable non contrasted appearance of the stent graft at the aortic arch and proximal descending thoracic aorta -Continue metoprolol at reduce dose  Leukocytosis -improved  -Inital **Note De-Identified vi Obfusction** Blood cultures negtive nd Repet showed 1/2 Yest.  Repet from 8/19 is NGTD.  -Pro clcitonin 0.24 on dmission. -Repet Imging showed Bilterl pleurl effusions, smll on the right nd moderte on the left. Worsening consolidtion in the lower lobes my reflect telectsis or pneumoni. -Ptient Currently on fluconzole  Hypoklemi - improved tody -Continue to Monitor nd Replete s Necessry -Repet CMP in AM  Hypophosphtemi, improving -d/c K phos will ctm -Continue to Monitor nd Replete s Necessry -Repet Phos Level in AM   Hypomgnesemi -Continue to Monitor nd Replete s Necessry -Repet Mg Level in AM  Pleurl Effusion on Left -U/S Guided Thorcentesis Ordered 09/19/17 -Resulted 0.65 mL of  Fluid Removl -Repet CXR 8/18 showed No chnge from previous dy's study. 2. Persistent left lung bse opcity consistent with combintion telectsis nd  probble smll effusion   DVT prophylxis: SCD's for low pltelets, consider phrm VTE ppx if improvement Code Sttus: full  Fmily Communiction:  Husbnd, dugther Disposition Pln: pending improvement in PO, functionl sttus   Consultnts:   ID, nutrition, psych, pllitive cre  Procedures: (Don't include imging studies which cn be uto populted. Include things tht cnnot be uto populted i.e. Echo, Crotid nd venous dopplers, Foley, Bipp, HD, tubes/drins, wound vc, centrl lines etc)  Thorcentesis  8/17 CXR with miniml L pleurl effusion fter throcentesis  CT bdomen pelvis without contrst - bilterl pelurl effusions (smll on R nd mod on L), possible consolidtion in lower lobes representing telectsis vs pneumoni, stble non constrsted ppernce of stent grft, negtive for neprholithisis, hydronephrosis, ureterl stone, stetosis, stble 2.2 cm L drenl mss, known thrombosed dissection, nsrc, smll scities, trophic L kidney  8/8 CT hed with no cute intrcrnil pthology, chronic ischemic chnges  8/8 CT ngio CAP intervl plcement of stent grft in trnsverse ortic rch nd proximl descending thorcic ort.  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  Continued thrombosis of L renl rtery with progressive trophy of L kidney.  Ftty infiltrtion of liver.  Wll enhncement of severl loos of smll bowel nd lrge bowe (colitis or enteritis).  Intervl development of liner filling defect in R externl ilic tery (thrombus, dissection?) potentill relted to stent plcement.   Antimicrobils: (specify strt nd plnned stop dte. Auto populted tbles re spce occupying nd do not give end dtes)  fluconzole    Subjective: N/V.  No CP or SOB.  Feels wek.    Objective: Vitls:   09/26/16 1930 09/26/16 2306 09/27/16 0600 09/27/16 1138  BP: 137/62 138/62 (!) 131/58 (!) 141/86  Pulse: (!) 57 (!) 113 (!) 114 69  Resp: _0 Temp: 98.6 F (37 C)  98.7 F (37.1 C) 98.4 F (36.9 C)  TempSrc: Orl  Orl Orl  SpO2: 96%  97% 98%  Weight:   71.2 kg (156 lb 14.4 oz)   Height:        Intke/Output Summry (Lst 24 hours) t 09/27/16 1213 Lst dt filed t 09/27/16 1016  Gross per 24 hour  Intke            852.5 ml  Output              550 ml  Net            302.5 ml   Filed Weights   09/25/16 0642 09/26/16 0558 09/27/16 0600  Weight: 70.5 kg (155 lb 6.4 oz) 70.8 kg (156 lb) 71.2 kg (156 lb 14.4 oz)    Exmintion:  Generl: **Note De-Identified vi Obfusction** No cute distress. Crdiovsculr: Hert sounds show  regulr rte, nd rhythm. No gllops or rubs. No murmurs. No JVD. Lungs: On room ir.  Unlbored. Crckles in lower lung fields.  Abdomen: Soft, nontender, nondistended with norml ctive bowel sounds. No msses. No heptosplenomegly. Neurologicl: Alert nd oriented. Moves ll extremitiesh. Crnil nerves II through XII grossly intct. Skin: Wrm nd dry. No rshes or lesions. Extremities: No clubbing or cynosis. 3+ edem bilterlly. Psychitric: Mood depressed  Dt Reviewed: I hve personlly reviewed following lbs nd imging studies  CBC:  Recent Lbs Lb 09/21/16 0357 09/22/16 0433 09/23/16 0315 09/24/16 1214 09/25/16 0750 09/25/16 1210 09/26/16 0451 09/27/16 1151  WBC 8.0 5.3 5.0 7.6 6.7  --  6.1 7.1  NEUTROABS 5.4 3.9 3.3  --   --   --   --   --   HGB 8.2* 8.1* 8.3* 9.3* 7.4* 7.7* 7.3* 8.3*  HCT 23.7* 24.6* 24.8* 28.8* 21.9* 23.8* 22.8* 24.9*  MCV 95.6 92.5 93.9 94.7 98.2  --  99.6 102.0*  PLT 72* 73* 72* 100* 85*  --  83* 98*   Bsic Metbolic Pnel:  Recent Lbs Lb 09/23/16 0315 09/23/16 1926 09/24/16 1214 09/25/16 0750 09/26/16 0451 09/27/16 0610  NA 137 135 136 136 136 138  K 2.6* 3.5 3.9 3.4* 3.2* 3.5  CL 109  107 106 105 105 107  CO2 21* 22 21* _0 GLUCOSE 74 102* 104* 75 125* 133*  BUN 22* 21* 21* _1 CREATININE 1.07* 1.04* 1.16* 1.06* 0.96 0.87  CALCIUM 7.0* 6.8* 7.2* 7.0* 7.2* 7.9*  MG 1.5*  --  1.7 1.5* 1.9 1.6*  PHOS 4.1  --  3.9 4.1 3.7 3.3   GFR: Estimted Cretinine Clernce: 65 mL/min (by [employer] formul bsed on SCr of 0.87 mg/dL). Liver Function Tests:  Recent Lbs Lb 09/23/16 0315 09/24/16 1214 09/25/16 0750 09/26/16 0451 09/27/16 0610  AST _2 ALT 11* 14 11* 11* 11*  ALKPHOS 445* 460* 344* 329* 488*  BILITOT 0.8 0.7 0.8 0.7 1.0  PROT 4.2* 4.7* 4.1* 4.4* 5.2*  ALBUMIN 1.2* 1.3* 1.2* 1.7* 2.1*   No results for input(s): LIPASE, AMYLASE in the lst 168 hours. No results for input(s): AMMONIA in the lst 168 hours. Cogultion Profile: No results for input(s): INR, PROTIME in the lst 168 hours. Crdic Enzymes: No results for input(s): CKTOTAL, CKMB, CKMBINDEX, TROPONINI in the lst 168 hours. BNP (lst 3 results) No results for input(s): PROBNP in the lst 8760 hours. HbA1C: No results for input(s): HGBA1C in the lst 72 hours. CBG:  Recent Lbs Lb 09/26/16 0743 09/26/16 1157 09/26/16 1631 09/27/16 0746 09/27/16 1109  GLUCAP 151* 156* 147* 126* 149*   Lipid Profile: No results for input(s): CHOL, HDL, LDLCALC, TRIG, CHOLHDL, LDLDIRECT in the lst 72 hours. Thyroid Function Tests: No results for input(s): TSH, T4TOTAL, FREET4, T3FREE, THYROIDAB in the lst 72 hours. Anemi Pnel: No results for input(s): VITAMINB12, FOLATE, FERRITIN, TIBC, IRON, RETICCTPCT in the lst 72 hours. Sepsis Lbs: No results for input(s): PROCALCITON, LATICACIDVEN in the lst 168 hours.  Recent Results (from the pst 240 hour(s))  Grm stin     Sttus: None   Collection Time: 09/18/16 12:14 PM  Result Vlue Ref Rnge Sttus   Specimen Description PLEURAL LEFT  Finl   Specil Requests NONE  Finl   Grm Stin   Finl    MODERATE WBC PRESENT,  PREDOMINANTLY PMN NO ORGANISMS SEEN  Report Status 09/18/2016 FINL  Final  Culture, body fluid-bottle     Status: None   Collection Time: 09/18/16 12:14 PM  Result Value Ref Range Status   Specimen Description PLEURL LEFT  Final   Special Requests NONE  Final   Culture NO GROWTH 5 DYS  Final   Report Status 09/23/2016 FINL  Final  Culture, blood (Routine X 2) w Reflex to ID Panel     Status: None   Collection Time: 09/20/16  3:01 PM  Result Value Ref Range Status   Specimen Description BLOOD BLOOD RIGHT HND  Final   Special Requests IN PEDITRIC BOTTLE Blood Culture adequate volume  Final   Culture NO GROWTH 5 DYS  Final   Report Status 09/25/2016 FINL  Final         Radiology Studies: Dg Chest Port 1 View  Result Date: 09/26/2016 [facility]IN[facility] DT:  Lung crackles EXM: PORTBLE CHEST 1 VIEW COMPRISON:  09/19/2016 FINDINGS: Prior aortic stent graft in the descending thoracic aorta. Heart is normal size. Bibasilar airspace opacities, left greater than right, worsening on the left since prior study. This could represent atelectasis although left lower lobe pneumonia cannot be excluded. Possible small left effusion. No acute bony abnormality. IMPRESSION: Bibasilar atelectasis or infiltrates, left greater than right. Possible small left effusion. Electronically Signed   By: Rolm Baptise M.D.   On: 09/26/2016 17:17   Dg bd Portable 1v  Result Date: 09/25/2016 [facility]INIC[facility] DT:  Feeding tube placement. EXM: PORTBLE BDOMEN - 1 VIEW COMPRISON:  CT scan of ugust 16, 2018. FINDINGS: Distal tip of feeding tube is seen in expected position of fourth portion of duodenum. Status post cholecystectomy. No abnormal bowel dilatation is noted. IMPRESSION: Distal tip of feeding tube is seen in expected position of fourth portion of duodenum. Electronically Signed   By: Marijo Conception, M.D.   On: 09/25/2016 14:20        Scheduled Meds: . LPRZolam  0.25 mg Oral BID  . RIPiprazole  5 mg  Oral Daily  . aspirin EC  81 mg Oral Daily  . bisacodyl  10 mg Rectal Once  . dronabinol  5 mg Oral BID C  . DULoxetine  40 mg Oral Daily  . feeding supplement (ENSURE ENLIVE)  237 mL Oral TID BM  . feeding supplement (PRO-STT SUGR FREE 64)  30 mL Per Tube Daily  . fluconazole  400 mg Oral Daily  . insulin aspart  0-5 Units Subcutaneous QHS  . insulin aspart  0-9 Units Subcutaneous TID WC  . lidocaine  1 application Urethral Once  . metoprolol tartrate  12.5 mg Oral BID  . pantoprazole  40 mg Oral QHS  . sodium chloride flush  3 mL Intravenous Q12H   Continuous Infusions: . feeding supplement (VITL 1.5 CL) Stopped (09/27/16 0115)  . magnesium sulfate 1 - 4 g bolus IVPB       LOS: 18 days    Time spent: 35 minutes     Melven Sartorius, MD [facility] Pager [phone]  If 7PM-7M, please contact night-coverage [email or web address] Password [facility] 09/27/2016, 12:13 PM

## 2016-09-27 NOTE — Progress Notes (Signed)
Patient's husband requesting to speak with a GI physician.

## 2016-09-27 NOTE — Progress Notes (Signed)
Pt's CBG was 130 last night  At 2059 but did not cross over.

## 2016-09-27 NOTE — Progress Notes (Signed)
Pt was not tolerating tube feeding earlier. She was still vomiting after administration of anti-emetic. The feeding tube was decreased from 40 ml/hr back to 30 ml/hr per MD on call order. After couple of hours, pt was still sick in her stomach. The feeding tube was stopped per husband's requests with MD order. The on-call MD suggested that the pt will be re-evaluated in AM. Currently, the pt's coretrack is capped until re-evaluated in AM.

## 2016-09-27 NOTE — Progress Notes (Signed)
Nutrition Brief Follow-up  Received page to on-call pager regarding patient's tube feeding. Patient experiencing vomiting. Rate was decreased from 40 ml/hr to 30 ml/hr, but was then stopped per patient's husband's request. MD would like to re-start tube feeds at a low rate. MD is going to start patient on erythromycin. Patient has intolerance/allergy to Reglan.  Noted on abdominal x-ray from 8/24 Cortrak tube confirmed to have tip in fourth portion of duodenum.  Recommend Vital 1.5 Cal at 20 ml/hr. If patient tolerates rate of 20 ml/hr overnight, recommend slow advancement to goal rate tomorrow. Can advance by 10 ml/hr Q8hrs to goal rate of Vital 1.5 Cal at 55 ml/hr + Pro-Stat 30 ml daily. Goal regimen provides 2080 kcal, 104 grams of protein, 1003 ml H2O daily.  Patient likely not vomiting tube feeds as they are infusing into her duodenum. Noted patient had 100% of her breakfast this morning and is also receiving Ensure Enlive TID. For breakfast today patient had scrambled eggs, 2 slices of bacon, white toast with margarine, grits with margarine, and orange juice (575 kcal, 19 grams of protein, 22 grams of fat). This is more likely what she is vomiting in setting of her gastroparesis. Patient's soft diet will restrict fiber intake, which will be beneficial in setting of her gastroparesis, but she may also benefit from education on choosing solid foods that are lower in fat to promote gastric emptying. Liquid forms of fat (such as in milk, milkshakes, etc) are typically tolerated well in gastroparesis.  Estimated Nutritional Needs:  Kcal:  2000-2200 Protein:  110-120 gm Fluid:  2.0-2.2 L  Willey Blade, MS, RD, LDN Pager: [phone] After Hours Pager: [phone]

## 2016-09-27 NOTE — Progress Notes (Signed)
Patients husband says she has been vomiting since the tube feeding was started via her NG tube. Patient went to CT, came back with brown vomit on her sheets. Patient is vomiting brown fluid. Stomach is also distended. Patient looks very ill.

## 2016-09-27 NOTE — Progress Notes (Signed)
Husband wants tube feeding stopped, not reconnected after patient back from CT.

## 2016-09-28 ENCOUNTER — Ambulatory Visit (HOSPITAL_COMMUNITY): Payer: BLUE CROSS/BLUE SHIELD

## 2016-09-28 DIAGNOSIS — F3289 Other specified depressive episodes: Secondary | ICD-10-CM

## 2016-09-28 DIAGNOSIS — F329 Major depressive disorder, single episode, unspecified: Secondary | ICD-10-CM

## 2016-09-28 DIAGNOSIS — F32A Depression, unspecified: Secondary | ICD-10-CM

## 2016-09-28 DIAGNOSIS — R000 Tachycardia, unspecified: Secondary | ICD-10-CM

## 2016-09-28 LAB — CBC
HEMATOCRIT: 26.2 % — AB (ref 36.0–46.0)
Hemoglobin: 8.2 g/dL — ABNORMAL LOW (ref 12.0–15.0)
MCH: 31.5 pg (ref 26.0–34.0)
MCHC: 31.3 g/dL (ref 30.0–36.0)
MCV: 100.8 fL — AB (ref 78.0–100.0)
PLATELETS: 100 10*3/uL — AB (ref 150–400)
RBC: 2.6 MIL/uL — ABNORMAL LOW (ref 3.87–5.11)
RDW: 24.8 % — AB (ref 11.5–15.5)
WBC: 9.5 10*3/uL (ref 4.0–10.5)

## 2016-09-28 LAB — GLUCOSE, CAPILLARY
GLUCOSE-CAPILLARY: 127 mg/dL — AB (ref 65–99)
Glucose-Capillary: 130 mg/dL — ABNORMAL HIGH (ref 65–99)
Glucose-Capillary: 144 mg/dL — ABNORMAL HIGH (ref 65–99)
Glucose-Capillary: 127 mg/dL — ABNORMAL HIGH (ref 65–99)
Glucose-Capillary: 130 mg/dL — ABNORMAL HIGH (ref 65–99)

## 2016-09-28 LAB — PHOSPHORUS: Phosphorus: 4 mg/dL (ref 2.5–4.6)

## 2016-09-28 LAB — TSH: TSH: 0.713 u[IU]/mL (ref 0.350–4.500)

## 2016-09-28 LAB — COMPREHENSIVE METABOLIC PANEL
ALBUMIN: 1.9 g/dL — AB (ref 3.5–5.0)
ALT: 13 U/L — AB (ref 14–54)
AST: 25 U/L (ref 15–41)
Alkaline Phosphatase: 756 U/L — ABNORMAL HIGH (ref 38–126)
Anion gap: 9 (ref 5–15)
BILIRUBIN TOTAL: 1.2 mg/dL (ref 0.3–1.2)
BUN: 15 mg/dL (ref 6–20)
CO2: 23 mmol/L (ref 22–32)
CREATININE: 0.89 mg/dL (ref 0.44–1.00)
Calcium: 7.6 mg/dL — ABNORMAL LOW (ref 8.9–10.3)
Chloride: 108 mmol/L (ref 101–111)
GFR calc Af Amer: >60 mL/min (ref >60)
GLUCOSE: 138 mg/dL — AB (ref 65–99)
POTASSIUM: 4.1 mmol/L (ref 3.5–5.1)
Sodium: 140 mmol/L (ref 135–145)
TOTAL PROTEIN: 4.9 g/dL — AB (ref 6.5–8.1)

## 2016-09-28 LAB — BRAIN NATRIURETIC PEPTIDE: B Natriuretic Peptide: 1093.3 pg/mL — ABNORMAL HIGH (ref 0.0–100.0)

## 2016-09-28 LAB — GAMMA GT: GGT: 522 U/L — AB (ref 7–50)

## 2016-09-28 LAB — MAGNESIUM: MAGNESIUM: 1.7 mg/dL (ref 1.7–2.4)

## 2016-09-28 MED ORDER — LISDEXAMFETAMINE DIMESYLATE 20 MG PO CAPS
20.0000 mg | ORAL_CAPSULE | Freq: Every day | ORAL | Status: DC
  Administered 2016-09-29 – 2016-10-21 (×18): 20 mg via ORAL
  Filled 2016-09-28 (×18): qty 1

## 2016-09-28 MED ORDER — FUROSEMIDE 10 MG/ML IJ SOLN
40.0000 mg | Freq: Once | INTRAMUSCULAR | Status: AC
  Administered 2016-09-28: 40 mg via INTRAVENOUS
  Filled 2016-09-28: qty 4

## 2016-09-28 MED ORDER — ALPRAZOLAM 0.25 MG PO TABS
0.2500 mg | ORAL_TABLET | Freq: Every day | ORAL | Status: DC
  Administered 2016-09-29 – 2016-10-20 (×18): 0.25 mg via ORAL
  Filled 2016-09-28 (×18): qty 1

## 2016-09-28 MED ORDER — DULOXETINE HCL 60 MG PO CPEP
60.0000 mg | ORAL_CAPSULE | Freq: Every day | ORAL | Status: DC
  Administered 2016-09-29 – 2016-10-10 (×12): 60 mg via ORAL
  Filled 2016-09-28 (×12): qty 1

## 2016-09-28 NOTE — Progress Notes (Signed)
Nutrition Follow-up  DOCUMENTATION CODES:   Severe malnutrition in context of chronic illness  INTERVENTION:   -Pt would likely benefit from initiation of supplemental TPN at this time as unable to meet nutritional needs via PO or EN (limited ability to tolerate EN or PO)  -Recommend continuing Vital 1.5 TF via post-pyloric Cortrak tube as tolerated by pt. Begin at 20 ml/hr; Goal rate is 55 ml/hr   NUTRITION DIAGNOSIS:   Malnutrition (severe) related to chronic illness (failure to thrive post repair aortic dissection) as evidenced by energy intake < or equal to 50% for > or equal to 1 month, percent weight loss.  Continues   GOAL:   Patient will meet greater than or equal to 90% of their needs  Not Met  MONITOR:   PO intake, Supplement acceptance, Labs, Weight trends, Skin, I & O's  REASON FOR ASSESSMENT:   Consult Poor PO  ASSESSMENT:    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 She presented with weakness and met sepsis criteria with concern for enteritis. Antibiotics started. Cultures pending.  TF on hold during the night due to brown emesis, abdomen distended  Again, Cortrak tube located in 4th portion of duodenum. Vomit is not likley  tube feeding due to location of Cortrak tube tip. Noted pt still with active diet order and taking some po with hx of gastroparesis, noted erythromycin started yesterday  Weight down since admission; noted pt +2 and 3+ edema all over. Net positive.   Labs: reviewed Meds: marinol, zofran, phenergan, erythromycin started 8/26  Diet Order:  DIET SOFT Room service appropriate? Yes; Fluid consistency: Thin  Skin:  Reviewed,  no issues (no pressure ulcers)  Last BM:  8/26  Height:   Ht Readings from Last 1 Encounters:  09/10/16 5' 3" (1.6 m)    Weight:   Wt Readings from Last 1 Encounters:  09/28/16 158 lb 3.2 oz (71.8 kg)    Ideal Body Weight:  52.2 kg  BMI:  Body mass index is 28.02 kg/m.  Estimated Nutritional Needs:   Kcal:  2000-2200  Protein:  110-120 gm  Fluid:  2.0-2.2 L  EDUCATION NEEDS:   No education needs identified at this time  Cate  MS, RD, LDN [phone] Pager  [phone] Weekend/On-Call Pager  

## 2016-09-28 NOTE — Progress Notes (Signed)
No vomiting noted this shift  But Cortrak tube has been leaking and draining brown output. Will monitor.

## 2016-09-28 NOTE — Progress Notes (Signed)
Patient complained of having shortness of breath on 2Lnc, O2Sats checked 100%.Patient took some of her pills without difficulty.Patient stated she will take the rest of the pills later.  Brother in Sports coach at bedside.Will monitor accordingly

## 2016-09-28 NOTE — Progress Notes (Signed)
Patient not compliant with incentive spirometer, says she's very sick.

## 2016-09-28 NOTE — Progress Notes (Signed)
Patient ID: Haley Graham, female   DOB: [birth year], 61 y.o.   MRN: [id number]  This NP visited patient at the bedside as a follow up for palliative needs and emotional support..  Brother in law at bedside.  [name] remains minimally interactive and flat. However; her eyes are open more and when I question her mood she tells me she is "a little better".  It is difficult to motivate the patient to participate and overall plan of care. I have encouraged her and her family to stimulate the patient with active range of motion exercises. Family at bedside continue to encourage and motivate patient to be more active.   Patient and I have discussed the power of positive thinking.  When investigating what brings joy to her chest speaks to her 61-year-old grandson. She tells me that she will try to incorporate thoughts of joy into her day.   Discussed again the patient's role in her own well-being.  Discussed with patient the importance of continued conversation with family and their  medical providers regarding overall plan of care and treatment options,  ensuring decisions are within the context of the patients values and GOCs.  Questions and concerns addressed  Discussed case with Dr Louretta Shorten   Time in    0940       Time out  1015  Time spent on the unit was 35 minutes  Greater than 50% of the time was spent in counseling and coordination of care  Wadie Lessen NP  Palliative Medicine Team Team Phone # [phone] Pager [phone]

## 2016-09-28 NOTE — Progress Notes (Signed)
Patients daughter called and made claims that no one explained to her father (patients husband) what was going on with patient and the results of a new test that patient the patient had. RN actually told husband what was going on and explained that the MD would have to go into detail and explain the test results. Daughter is upset and claims that husband is upset and crying. RN did not see husband upset. Daughter then explains how no one is in the room with her mom because the husband left to go to work and someone needs to be in the room with her mom because she was vomiting last night. RN was not aware of the time the husband left since husband was in room less than an hour before daughter called.

## 2016-09-28 NOTE — Progress Notes (Signed)
Physical Therapy Treatment Patient Details Name: Haley Graham MRN: [id number] DOB: March 03, 1955 Today's Date: 09/28/2016    History of Present Illness This 61 y.o. female admitted with sepsis, hypotension, dehydration, amemia, COPD, AKI due to poor oral intake and hypotension/dehydration.  PMH  includes:  Recent prolonged hospitalization for [place] type B aortic dissection s/p descending thoracic aortic stent graft placement; anxiety, GERD, COPD,     PT Comments    Patient able to sit EOB ~12 mins with min guard assist (min A initially). Attempted to stand X2 but unable to achieve standing. Pt continues to participate minimally and given max encouragement to assist in mobility. Continue to progress as tolerated.    Follow Up Recommendations  SNF     Equipment Recommendations  None recommended by PT    Recommendations for Other Services       Precautions / Restrictions Precautions Precautions: Fall Restrictions Weight Bearing Restrictions: No    Mobility  Bed Mobility Overal bed mobility: Needs Assistance Bed Mobility: Sit to Supine;Supine to Sit     Supine to sit: Mod assist Sit to supine: Max assist;+2 for physical assistance   General bed mobility comments: cues for sequencing; use of bed rail and chuck pad; assist at bilat LE and trunk   Transfers Overall transfer level: Needs assistance   Transfers: Sit to/from Stand Sit to Stand: +2 physical assistance;Max assist;Total assist         General transfer comment: attempted X2 to stand; first trial pt was assisting but took L foot off of ground and unable to lift buttocks from bed and second trial pt not assisting enough to stand with feet blocked for safety  Ambulation/Gait                 Stairs            Wheelchair Mobility    Modified Rankin (Stroke Patients Only)       Balance Overall balance assessment: Needs assistance Sitting-balance support: Bilateral upper extremity  supported;Feet supported Sitting balance-Leahy Scale: Fair                                      Cognition Arousal/Alertness: Awake/alert Behavior During Therapy: Flat affect Overall Cognitive Status: Impaired/Different from baseline Area of Impairment: Safety/judgement;Problem solving;Attention;Following commands                   Current Attention Level: Selective Memory: Decreased short-term memory Following Commands: Follows one step commands with increased time Safety/Judgement: Decreased awareness of safety;Decreased awareness of deficits Awareness: Intellectual Problem Solving: Slow processing;Decreased initiation;Requires verbal cues;Requires tactile cues General Comments: brother in law present beginning of session and pt responded "I don't know" when asked who he was      Exercises      General Comments        Pertinent Vitals/Pain Pain Assessment: Faces Faces Pain Scale: Hurts little more Pain Location: back and abdomen Pain Descriptors / Indicators: Grimacing;Guarding Pain Intervention(s): Limited activity within patient's tolerance;Monitored during session;Repositioned    Home Living                      Prior Function            PT Goals (current goals can now be found in the care plan section) Acute Rehab PT Goals PT Goal Formulation: With patient Time For Goal Achievement: 09/12/2016 Potential to  Achieve Goals: Fair Progress towards PT goals: Not progressing toward goals - comment    Frequency    Min 2X/week      PT Plan Frequency needs to be updated    Co-evaluation              AM-PAC PT "6 Clicks" Daily Activity  Outcome Measure  Difficulty turning over in bed (including adjusting bedclothes, sheets and blankets)?: Unable Difficulty moving from lying on back to sitting on the side of the bed? : Unable Difficulty sitting down on and standing up from a chair with arms (e.g., wheelchair, bedside  commode, etc,.)?: Unable Help needed moving to and from a bed to chair (including a wheelchair)?: Total Help needed walking in hospital room?: Total Help needed climbing 3-5 steps with a railing? : Total 6 Click Score: 6    End of Session Equipment Utilized During Treatment: Gait belt Activity Tolerance: Patient limited by fatigue Patient left: with call bell/phone within reach;in bed;with bed alarm set Nurse Communication: Mobility status PT Visit Diagnosis: Muscle weakness (generalized) (M62.81);Other abnormalities of gait and mobility (R26.89);Unsteadiness on feet (R26.81);Adult, failure to thrive (R62.7)     Time: 1455-1520 PT Time Calculation (min) (ACUTE ONLY): 25 min  Charges:  $Therapeutic Activity: 23-37 mins                    G Codes:       Earney Navy, PTA Pager: [phone]     Darliss Cheney 09/28/2016, 4:10 PM

## 2016-09-28 NOTE — Clinical Social Work Note (Signed)
CSW continues to follow for discharge needs.  Zaela Graley, CSW [phone]  

## 2016-09-28 NOTE — Consult Note (Signed)
[facility] Consult   Reason for Consult:  Severe depression Referring Physician:  Dr. Alfredia Ferguson Patient Identification: Haley Graham MRN:  [id number] Principal Diagnosis: Candidemia [facility]) Diagnosis:   Patient Active Problem List   Diagnosis Date Noted  . Candidemia ([place]) [B37.7] 09/21/2016  . Hypophosphatemia [E83.39] 09/21/2016  . Hypomagnesemia [E83.42] 09/21/2016  . Palliative care by specialist [Z51.5]   . Adult failure to thrive [R62.7]   . Protein-calorie malnutrition, severe [E43] 09/12/2016  . Sepsis ([place]) [A41.9] 09/15/2016  . Hypotension [I95.9] 09/02/2016  . AKI (acute kidney injury) ([place]) [N17.9] 09/21/2016  . Dehydration [E86.0] 09/16/2016  . Generalized weakness [R53.1] 09/22/2016  . Anemia [D64.9] 09/05/2016  . Thrombocytopenia ([place]) [D69.6] 09/16/2016  . Abnormal computed tomography angiography (CTA) of abdomen and pelvis [R93.5] 09/14/2016  . Colitis: Probable [K52.9] 09/03/2016  . AAA (abdominal aortic aneurysm) ([place]) [I71.4]   . Abdominal pain [R10.9]   . Ischemic enteritis ([place]) [K55.9]   . Benign essential HTN [I10]   . Diabetes mellitus type 2 in obese (HCC) [E11.69, E66.9]   . Anxiety state [F41.1]   . Tachycardia [R00.0]   . Tachypnea [R06.82]   . Hyponatremia [E87.1]   . Leukocytosis [D72.829]   . Acute blood loss anemia [D62]   . Dissection of thoracoabdominal aorta ([place]) [I71.03]   . Unstable angina pectoris ([place]) [I20.0]   . SOB (shortness of breath) [R06.02]   . 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 Change in stool [R19.5] 05/28/2016  . Gastroparesis [K31.84] 11/19/2011  . Nausea [R11.0] 11/19/2011  . Dysphagia [R13.10] 11/11/2011  . GERD (gastroesophageal reflux disease) [K21.9] 11/11/2011    Total Time spent with patient: 1 hour  Subjective:   Haley Graham is a 61 y.o. female patient admitted with generalized weakness.  HPI:  Haley Graham is a 61 y.o. female, seen, chart  reviewed for this face-to-face psychiatric consultation and evaluation of increased symptoms of depression. Patient reported her mom died 03-01-2016 with multiple medical problems. Patient has been depressed and anxious, has been receiving outpatient medication management from primary care physician who started Celexa and Xanax which seems to be helped initially but not working any longer. Patient worried that she is not getting regular Xanax while in the hospital which is making her jitters. Patient has been sad, isolated, withdrawn, generalized weakness disturbed appetite and sleep. Patient stated that she has clear mind and able to make her own decisions she does not want anybody else make decision for her. Patient reported few months ago she called her husband and requested to call the ambulance when she had problem with her aorta dissection. Patient denied suicidal/homicidal ideations, intention or plans. Patient has no evidence of psychosis. Patient daughter who is at bedside left the room because patient does not want her daughter to be at bedside while talking with the psychiatrist. Patient is also feels that her daughter is trying to take power of attorney to make medical decisions for her.  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  Past Psychiatric History: Patient has no acute psychiatric hospitalization but receiving outpatient medication management from primary care physician for depression and anxiety.  09/28/2016 Interval history: Patient seen today for psychiatric consultation follow-up of severe depression. Patient has  been receiving Cymbalta 40 mg, Xanax 0.25  mg twice daily and Abilify 5 mg for depression at this time. Patient continued to be depressed, decreased psychomotor activity and unable to care for herself, she needs to feed and taken to her by staff members. Reportedly family has been supportive. Review of EKGs indicated she has a normal QT and QTC without any prolongation. Patient stated she is fine but has constricted affect. Patient is talks very limited and simple words only patient has poor eye contact. Patient has no suicidal ideation/homicidal ideation or evidence of psychosis. Patient was informed we will increase her Cymbalta from 40 mg to 60 mg may benefit from short-term small dose of stimulant medication Vyvanse 20 mg a day to improve the psychomotor activity. Patient benefit from psychosocial stimulation too.   Risk to Self: Is patient at risk for suicide?: No Risk to Others:   Prior Inpatient Therapy:   Prior Outpatient Therapy:    Past Medical History:  Past Medical History:  Diagnosis Date  . Anxiety   . Arthritis   . Diabetes mellitus without complication ([place])   . Gastroparesis   . GERD (gastroesophageal reflux disease)   . Hypertension   . IBS (irritable bowel syndrome)     Past Surgical History:  Procedure Laterality Date  . ABDOMINAL HYSTERECTOMY    . AORTOGRAM N/A 07/24/2016   Procedure: [facility] AND ABDOMINAL AORTA  ANGIOGRAM;  Surgeon: Waynetta Sandy, MD;  Location: [place];  Service: Vascular;  Laterality: N/A;  . CESAREAN SECTION    . CHOLECYSTECTOMY    . COLONOSCOPY    . COLONOSCOPY N/A 06/10/2016   Procedure: COLONOSCOPY;  Surgeon: Rogene Houston, MD;  [facility];  Service: Endoscopy;  Laterality: N/A;  8:30  . IR THORACENTESIS ASP PLEURAL SPACE W/IMG GUIDE  09/18/2016  . POLYPECTOMY  06/10/2016   Procedure: POLYPECTOMY;  Surgeon: Rogene Houston, MD;  [facility]: [facility];  Service: Endoscopy;;  multiple colon  . THORACIC AORTIC ENDOVASCULAR STENT GRAFT N/A 08/06/2016   Procedure:  THORACIC AORTIC ENDOVASCULAR STENT GRAFT/Thorasic and Abdominal Angiogram, Entravascular ultrasound., Open femoral exposure,Left Brachial access, and patch angioplasty right femoral artery.;  Surgeon: Serafina Mitchell, MD;  Location: [facility] OR;  Service: Vascular;  Laterality: N/A;  . UPPER GASTROINTESTINAL ENDOSCOPY     Family History:  Family History  Problem Relation Age of Onset  . Cancer Mother   . Stroke Mother   . Heart attack Mother   . Stroke Father    Family Psychiatric  History: Unknown Social History:  History  Alcohol Use No     History  Drug Use No    Social History   Social History  . Marital status: Married    Spouse name: N/A  . Number of children: N/A  . Years of education: N/A   Social History Main Topics  . Smoking status: Current Every Day Smoker    Packs/day: 0.75    Years: 23.00    Types: Cigarettes  . Smokeless tobacco: Never Used     Comment: 5-6 cigarettes. Cut back about 3 weeks ago. Smoking since age 70.  Marland Kitchen Alcohol use No  . Drug use: No  . Sexual activity: Not Asked   Other Topics Concern  . None   Social History Narrative  . None   Additional Social History:    Allergies:   Allergies  Allergen Reactions  . Penicillins Rash and Other (See Comments)    PATIENT HAS HAD A  PCN REACTION WITH IMMEDIATE RASH, FACIAL/TONGUE/THROAT SWELLING, SOB, OR LIGHTHEADEDNESS WITH HYPOTENSION:  #  #  #  YES  #  #  #   Has patient had a PCN reaction causing severe rash involving mucus membranes or skin necrosis: no Has patient had a PCN reaction that required hospitalization no Has patient had a PCN reaction occurring within the last 10 years:no If all of the above answers are "NO", then may proceed with Cephalosporin use.  . Ativan [Lorazepam] Other (See Comments)    Makes pt very confused and more agitated, irritable.  . Codeine Nausea And Vomiting  . Reglan [Metoclopramide] Other (See Comments)    TACHYCARDIA    Labs:  Results for orders placed  or performed during the hospital encounter of 09/08/2016 (from the past 48 hour(s))  Glucose, capillary     Status: Abnormal   Collection Time: 09/26/16  4:31 PM  Result Value Ref Range   Glucose-Capillary 147 (H) 65 - 99 mg/dL  Glucose, capillary     Status: Abnormal   Collection Time: 09/26/16  8:59 PM  Result Value Ref Range   Glucose-Capillary 130 (H) 65 - 99 mg/dL  Magnesium     Status: Abnormal   Collection Time: 09/27/16  6:10 AM  Result Value Ref Range   Magnesium 1.6 (L) 1.7 - 2.4 mg/dL  Phosphorus     Status: None   Collection Time: 09/27/16  6:10 AM  Result Value Ref Range   Phosphorus 3.3 2.5 - 4.6 mg/dL  Comprehensive metabolic panel     Status: Abnormal   Collection Time: 09/27/16  6:10 AM  Result Value Ref Range   Sodium 138 135 - 145 mmol/L   Potassium 3.5 3.5 - 5.1 mmol/L   Chloride 107 101 - 111 mmol/L   CO2 24 22 - 32 mmol/L   Glucose, Bld 133 (H) 65 - 99 mg/dL   BUN 12 6 - 20 mg/dL   Creatinine, Ser 0.87 0.44 - 1.00 mg/dL   Calcium 7.9 (L) 8.9 - 10.3 mg/dL   Total Protein 5.2 (L) 6.5 - 8.1 g/dL   Albumin 2.1 (L) 3.5 - 5.0 g/dL   AST 21 15 - 41 U/L   ALT 11 (L) 14 - 54 U/L   Alkaline Phosphatase 488 (H) 38 - 126 U/L   Total Bilirubin 1.0 0.3 - 1.2 mg/dL   GFR calc non Af Amer >60 >60 mL/min   GFR calc Af Amer >60 >60 mL/min    Comment: (NOTE) The eGFR has been calculated using the CKD EPI equation. This calculation has not been validated in all clinical situations. eGFR's persistently <60 mL/min signify possible Chronic Kidney Disease.    Anion gap 7 5 - 15  Glucose, capillary     Status: Abnormal   Collection Time: 09/27/16  7:46 AM  Result Value Ref Range   Glucose-Capillary 126 (H) 65 - 99 mg/dL   Comment 1 Notify RN   Glucose, capillary     Status: Abnormal   Collection Time: 09/27/16 11:09 AM  Result Value Ref Range   Glucose-Capillary 149 (H) 65 - 99 mg/dL   Comment 1 Notify RN   CBC     Status: Abnormal   Collection Time: 09/27/16 11:51 AM   Result Value Ref Range   WBC 7.1 4.0 - 10.5 K/uL   RBC 2.44 (L) 3.87 - 5.11 MIL/uL   Hemoglobin 8.3 (L) 12.0 - 15.0 g/dL   HCT 24.9 (L) 36.0 - 46.0 %  MCV 102.0 (H) 78.0 - 100.0 fL   MCH 34.0 26.0 - 34.0 pg   MCHC 33.3 30.0 - 36.0 g/dL   RDW 25.2 (H) 11.5 - 15.5 %   Platelets 98 (L) 150 - 400 K/uL    Comment: CONSISTENT WITH PREVIOUS RESULT  Glucose, capillary     Status: Abnormal   Collection Time: 09/27/16  4:41 PM  Result Value Ref Range   Glucose-Capillary 128 (H) 65 - 99 mg/dL  Glucose, capillary     Status: Abnormal   Collection Time: 09/27/16  9:17 PM  Result Value Ref Range   Glucose-Capillary 115 (H) 65 - 99 mg/dL  Glucose, capillary     Status: Abnormal   Collection Time: 09/28/16  7:08 AM  Result Value Ref Range   Glucose-Capillary 144 (H) 65 - 99 mg/dL   Comment 1 Notify RN   Magnesium     Status: None   Collection Time: 09/28/16  7:31 AM  Result Value Ref Range   Magnesium 1.7 1.7 - 2.4 mg/dL  Phosphorus     Status: None   Collection Time: 09/28/16  7:31 AM  Result Value Ref Range   Phosphorus 4.0 2.5 - 4.6 mg/dL  Comprehensive metabolic panel     Status: Abnormal   Collection Time: 09/28/16  7:31 AM  Result Value Ref Range   Sodium 140 135 - 145 mmol/L   Potassium 4.1 3.5 - 5.1 mmol/L   Chloride 108 101 - 111 mmol/L   CO2 23 22 - 32 mmol/L   Glucose, Bld 138 (H) 65 - 99 mg/dL   BUN 15 6 - 20 mg/dL   Creatinine, Ser 0.89 0.44 - 1.00 mg/dL   Calcium 7.6 (L) 8.9 - 10.3 mg/dL   Total Protein 4.9 (L) 6.5 - 8.1 g/dL   Albumin 1.9 (L) 3.5 - 5.0 g/dL   AST 25 15 - 41 U/L   ALT 13 (L) 14 - 54 U/L   Alkaline Phosphatase 756 (H) 38 - 126 U/L   Total Bilirubin 1.2 0.3 - 1.2 mg/dL   GFR calc non Af Amer >60 >60 mL/min   GFR calc Af Amer >60 >60 mL/min    Comment: (NOTE) The eGFR has been calculated using the CKD EPI equation. This calculation has not been validated in all clinical situations. eGFR's persistently <60 mL/min signify possible Chronic  Kidney Disease.    Anion gap 9 5 - 15  CBC     Status: Abnormal   Collection Time: 09/28/16  7:31 AM  Result Value Ref Range   WBC 9.5 4.0 - 10.5 K/uL   RBC 2.60 (L) 3.87 - 5.11 MIL/uL   Hemoglobin 8.2 (L) 12.0 - 15.0 g/dL   HCT 26.2 (L) 36.0 - 46.0 %   MCV 100.8 (H) 78.0 - 100.0 fL   MCH 31.5 26.0 - 34.0 pg   MCHC 31.3 30.0 - 36.0 g/dL   RDW 24.8 (H) 11.5 - 15.5 %   Platelets 100 (L) 150 - 400 K/uL    Comment: CONSISTENT WITH PREVIOUS RESULT  Glucose, capillary     Status: Abnormal   Collection Time: 09/28/16 11:52 AM  Result Value Ref Range   Glucose-Capillary 127 (H) 65 - 99 mg/dL   Comment 1 Notify RN     Current Facility-Administered Medications  Medication Dose Route Frequency Provider Last Rate Last Dose  . acetaminophen (TYLENOL) tablet 650 mg  650 mg Oral Q6H PRN Eugenie Filler, MD   650 mg at 09/15/16 1208  Or  . acetaminophen (TYLENOL) suppository 650 mg  650 mg Rectal Q6H PRN Eugenie Filler, MD      . ALPRAZolam Duanne Moron) tablet 0.25 mg  0.25 mg Oral BID Ambrose Finland, MD   0.25 mg at 09/28/16 1017  . alum & mag hydroxide-simeth (MAALOX/MYLANTA) 200-200-20 MG/5ML suspension 30 mL  30 mL Oral BID PRN Eugenie Filler, MD   30 mL at 09/13/16 7106  . ARIPiprazole (ABILIFY) tablet 5 mg  5 mg Oral Daily Ambrose Finland, MD   5 mg at 09/28/16 1020  . aspirin EC tablet 81 mg  81 mg Oral Daily Patrecia Pour, MD   81 mg at 09/28/16 1017  . dronabinol (MARINOL) capsule 5 mg  5 mg Oral BID AC Sheikh, Omair Cosby, DO   5 mg at 09/28/16 1238  . DULoxetine (CYMBALTA) DR capsule 40 mg  40 mg Oral Daily Ambrose Finland, MD   40 mg at 09/28/16 1021  . erythromycin (EES) 400 MG/5ML suspension 250 mg  250 mg Oral Q8H Opyd, Ilene Qua, MD   250 mg at 09/28/16 0514  . feeding supplement (ENSURE ENLIVE) (ENSURE ENLIVE) liquid 237 mL  237 mL Oral TID BM Mariel Aloe, MD   237 mL at 09/27/16 1238  . feeding supplement (PRO-STAT SUGAR FREE 64) liquid 30  mL  30 mL Per Tube Daily Ladene Artist., MD   30 mL at 09/27/16 1014  . feeding supplement (VITAL 1.5 CAL) liquid 1,000 mL  1,000 mL Per Tube Q24H Ladene Artist., MD   Stopped at 09/27/16 2124  . fluconazole (DIFLUCAN) tablet 400 mg  400 mg Oral Daily Michel Bickers, MD   400 mg at 09/28/16 1020  . fluticasone (FLONASE) 50 MCG/ACT nasal spray 2 spray  2 spray Each Nare Daily PRN Eugenie Filler, MD      . heparin injection 5,000 Units  5,000 Units Subcutaneous Q8H Ladene Artist., MD   5,000 Units at 09/28/16 [phone]  . insulin aspart (novoLOG) injection 0-5 Units  0-5 Units Subcutaneous QHS Eugenie Filler, MD   Stopped at 09/15/2016 2212  . insulin aspart (novoLOG) injection 0-9 Units  0-9 Units Subcutaneous TID WC Mariel Aloe, MD   1 Units at 09/28/16 1238  . ipratropium-albuterol (DUONEB) 0.5-2.5 (3) MG/3ML nebulizer solution 3 mL  3 mL Nebulization Q4H PRN Sheikh, Omair Latif, DO      . lidocaine (XYLOCAINE) 1 % (with pres) injection   Infiltration PRN Saverio Danker, PA-C   10 mL at 09/18/16 1212  . lidocaine (XYLOCAINE) 2 % jelly 1 application  1 application Urethral Once Ladene Artist., MD      . loperamide (IMODIUM) capsule 4 mg  4 mg Oral PRN Mariel Aloe, MD   4 mg at 09/13/16 2137  . metoprolol tartrate (LOPRESSOR) tablet 12.5 mg  12.5 mg Oral BID Mariel Aloe, MD   12.5 mg at 09/28/16 1017  . morphine 2 MG/ML injection 2 mg  2 mg Intravenous Q6H PRN Raiford Noble Latif, DO   2 mg at 09/28/16 1054  . ondansetron (ZOFRAN) injection 4 mg  4 mg Intravenous Q6H PRN Eugenie Filler, MD   4 mg at 09/27/16 2112  . ondansetron (ZOFRAN) tablet 4 mg  4 mg Oral Q8H PRN Eugenie Filler, MD   4 mg at 09/25/16 1022  . pantoprazole (PROTONIX) EC tablet 40 mg  40 mg Oral QHS Karren Cobble,  RPH   40 mg at 09/27/16 2254  . promethazine (PHENERGAN) injection 12.5 mg  12.5 mg Intravenous Q6H PRN Opyd, Ilene Qua, MD   12.5 mg at 09/28/16 0315  . senna-docusate  (Senokot-S) tablet 1 tablet  1 tablet Oral QHS PRN Eugenie Filler, MD      . sodium chloride flush (NS) 0.9 % injection 3 mL  3 mL Intravenous Q12H Eugenie Filler, MD   3 mL at 09/27/16 2123  . sodium phosphate (FLEET) 7-19 GM/118ML enema 1 enema  1 enema Rectal Once PRN Eugenie Filler, MD      . sorbitol 70 % solution 30 mL  30 mL Oral Daily PRN Eugenie Filler, MD      . traMADol Veatrice Bourbon) tablet 50 mg  50 mg Oral Q6H PRN Schorr, Rhetta Mura, NP   50 mg at 09/27/16 1745    Musculoskeletal: Strength & Muscle Tone: decreased Gait & Station: unable to stand Patient leans: N/A  Psychiatric Specialty Exam: Physical Exam as per history and physical   ROS  Blood pressure 108/82, pulse 100, temperature 98.4 F (36.9 C), temperature source Oral, resp. rate 18, height _0  (1.6 m), weight 71.8 kg (158 lb 3.2 oz), SpO2 96 %.Body mass index is 28.02 kg/m.  General Appearance: Casual  Eye Contact:  Good  Speech:  Clear and Coherent and Slow  Volume:  Decreased  Mood:  Anxious and Depressed  Affect:  Constricted and Depressed  Thought Process:  Coherent and Goal Directed  Orientation:  Full (Time, Place, and Person)  Thought Content:  Logical and indecisiveness noted.  Suicidal Thoughts:  No  Homicidal Thoughts:  No  Memory:  Immediate;   Fair Recent;   Fair Remote;   Fair  Judgement:  Fair  Insight:  Fair  Psychomotor Activity:  Psychomotor Retardation  Concentration:  Concentration: Fair and Attention Span: Fair  Recall:  [place] of Knowledge:  Good  Language:  Good  Akathisia:  Negative  Handed:  Right  AIMS (if indicated):     Assets:  Communication Skills Desire for Improvement Financial Resources/Insurance Housing Intimacy Leisure Time Resilience Social Support Transportation  ADL's:  Impaired  Cognition:  Impaired,  Mild  Sleep:        Treatment Plan Summary: 61 years old female presented with generalized weakness, depression, anxiety and  increased psychomotor retardation. Patient has a status post aortic graft couple months ago to continue diuretic dissection  Maj. depressive disorder, recurrent Generalized anxiety disorder  Recommendation: Change Xanax 0.25 mg Qhs for anxiety Increase Cymbalta to 60 mg once daily for better control of depression  We'll give a trial of short-term psychostimulant medication Vyvanse 20 mg daily and closely monitor for the psychomotor activity and appetite  Case discussed with Wadie Lessen, NP who is involved with her care and a treatment team member who has been in contact with patient family.  Patient family is willing to give a trial of medication for decreasing depression, anxiety and increase psychomotor activity.  Daily contact with patient to assess and evaluate symptoms and progress in treatment and Medication management   Appreciate psychiatric consultation and follow up as clinically required Please contact [phone] or [phone] if needs further assistance  Disposition: Patient does not meet criteria for psychiatric inpatient admission. Supportive therapy provided about ongoing stressors.  Ambrose Finland, MD 09/28/2016 1:06 PM

## 2016-09-28 NOTE — Progress Notes (Signed)
Called on call hospitalist too confirm saline infusion, recommendations to continue infusion.

## 2016-09-28 NOTE — Progress Notes (Signed)
PROGRESS NOTE    Haley Graham  DVV:[id number] DOB: 12/22/1955 DOA: 09/24/2016 PCP: Glenda Chroman, MD (Confirm with patient/family/NH records and if not entered, this HAS to be entered at [facility] point of entry. 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 She presented with weakness and met sepsis criteria with concern for enteritis. Antibiotics started.   Patient had worsening Cr and Leukocytosis worsened and now improved. Complaining of Nausea and Abdominal Pain so will re-culture and repeat Imaging. Appears depressed and does not want to eat and just wants to lay in bed so will get Psych evaluation and place patient on Marinol. Discussed with Husband about Palliative Care Consult and he declined at this time. Patient refusing care and does not want to participate. Psych Evaluated and has adjusted medications. Patient also was found to have Haley Graham Left Pleural Effusion and underwent Thoracentesis and also was transfused 1 unit of pRBCs on 09/18/16.   This AM she was found to have [name] Fungemia with 1/2 Blood Cx growing Yeast so Fluconazole was D/C'd and patient changed to Eraxis. Infectious Diseases was consulted and recommended getting repeat Imaging of Graft as she was high risk of Seeding. Sensitivities of Yeast revealed Candidemia so Eraxis was changed back to Fluconazole.  Cefepime and Metronidazole were discontinued and Repeat Blood Cx showed NGTD at 2 days. Palliative Care met with the patient yesterday and today and Goals of Care to be discussed but patient not motivated and unwilling to  participate.  It has been extremely hard to motivate the patient to do anything and per nurse the patient has been refusing medications and Physical Therapy.   Plan for fluconazole to continue through 9/26 per ID.  She'll need an eye exam within 1 month.  Will need to discuss follow up imaging of graft and echo with ID.  We discussed cortrak for nutrition today on 8/23 which was placed on 8/24.  Plan to start feeds.  She was having urinary retention on 8/24 and her foley was replaced.    Tube feeds started 8/24, but difficulty keeping them on bc of nausesa/vomiting.  Now holding them.  She's notably tachy, but with normal WBC count and UA from 8/24 neg for nitrite and LE.  CXR because of crackles had bibasilar atelectasis vs infiltrates L>R and possible small L pleural effusion.   CT PE protocol with no PE, but small mod L pleural effusiona nd L greater than RLL consoldiation (atelectasis vs PNA) and edema.  Low suspicion for infection at this time with normal WBC and satting normally on room air, BNP, TSH, restart lasix.    Assessment & Plan:   Principal Problem:   Candidemia ([name]) Active Problems:   GERD (gastroesophageal reflux disease)   Dissection of thoracoabdominal aorta (HCC)   AAA (abdominal aortic aneurysm) (HCC)   Benign essential HTN   Diabetes mellitus type 2 in obese (HCC)   Anxiety state   Hyponatremia   Leukocytosis   Sepsis (HCC)   Hypotension   AKI (acute kidney injury) ([name])   Dehydration   Generalized weakness   Anemia   Thrombocytopenia (HCC)   Abnormal computed tomography angiography (CTA) of abdomen and pelvis  Colitis: Probable   Protein-calorie malnutrition, severe   Palliative care by specialist   Adult failure to thrive   Hypophosphatemia   Hypomagnesemia   Candida Albicans Bacteremia  Resolved sepsis -Initially suspected econdary to colitis/enteritis but patient found to have Haley Graham Bacteremia with Yeast and grew out Candida Albicans urine from 8/15  notably also grew out candida albicans (09/16/16 - repeat blood culture from 8/19 NGTD x 5 days) - was started on fluconazole initially and briefly on eraxis, but switched back to fluconazole with candida albicans to complete on 9/26 - WBC normal today  -GI pathogen panel and C. Diff negative.  -Initial Urine/bloodculture negative. Repeat U/Ferrell Flam shows evidence of UTI with Many bacteria, Large Hb, Trace Leukocytes, Positive Nitrites, TNTC RBC, TNTC WBC -IV Flagyl and Cefepime now Discontinued per ID Recc's as she has had adequate Treatment -Repeat CT Chest/Abd/Pelvis w/o Contrast done and showed Pleural Effusion -Continue to Monitor  - Initially thought she would need reimaging of Graft as she is at high risk of seeding and likely repeat Limited ECHO --- I discussed this 8/24 with ID (Dr. Megan Salon) who noted she probably will not need reimaging given previous exams (CTA 8/26 with stable aortic stent graft and faintly visualized dissection flap in distal descending thoracic aorta) '[ ]'  Will likely also need Opthamology Consult at some point for Dilated Opthalmologic exam and ID recommending within the next few weeks -[place] and encouraged patient and will met to Discuss Code Status and patient refusing to participate in Discussion as she has no motivation; Remains FULL CODE  Candidemia/Yeast UTI -As above -Continue Fluconazole per ID Recc's  -reimaging of graft likely not needed per my discussion with Dr. Megan Salon today -Will also need Opthalmologic Evaluation at some point  -Infectious Disease has signed of  Tachycardia - Persistent today since the 24th and has been persistent since then.  Initially thought due to intravascular depletion, but no real improvement with albumin given 8/24 and 8/25.  Gave 1 L NS on 8/26, but still no improvement.    CT PE protocol from 8.26 with no PE (though limited assessement of distal branch vessels).  It did show R pleural effusion, decreased in  size and small mod L pleural effusions similar to prior.  Also L>R LL consolidation, atelectasis vs pneumonia.  Ground glass densities suspicious for mild edema.  I have low suspicion for pneumonia with her normal WBC, satting normally on room air, suspect likely atelectasis and edema given clinical scenario.  Notably marinol can cause some tachycardia... '[ ]'  f/u proBNP, lasix - this mornings EKG with sinus tach, PAC, normal QT.  - was thought to have rebound earlier secondary to abrupt cessation of beta-blocker. - continue metoprolol at reduced dose of 12.9m BID  L>R lower lobe consolidation on 8/26 CT  - low suspicion for pneumonia as above, continue IS, will likely resume lasix today.  She's asked for O2 for comfort, but satting normally on RA. '[ ]'  daily CBC, watch temp  Hypotension - improved -Improved with IV fluids which has made her anasarca worse. Troponin negative. Albumin extremely low. -Gave 1x dose of IV Albumin on 09/19/16 and additional today on 8/24 for hypotension with mild tachycardia, ctm.  Repeat albumin 8/25.  -monitor BP  Dehydration/Poor oral intake/FTT  Cortrak in place:  Doing poorly with feeds, held at this time.  Trying erythromycin for gastroparesis.  - erythromycin for gastroparesis -encourage oral intake -dietitian consulted: protein supplementation as patient has Significant Hypoalbuminemia and LE swelling  -  Will add Marinol to help appetite Stimulant; Increased to 5 mg po BID '[ ]'  consider remeron in future, esp with mood -Albumin was 1.2 ->  Up to 2.1 with boluses '[x]'  cortrak placed (x ray with distal tip of feeding tube in 4th portion of duodenum), will start feeds per nutrition '[ ]'  monitor daily mag, phos, k for refeeding  Crackles: R lung base, on room air, suspect atelectasis.  CXR with bibasilar atelectasis (vs infiltrates) L>R.  Also possible small L pleural effusion (seen previously).   Normocytic Anemia,  AOCD pattern, elevated B12, normal  folate -stably low, 8.3 '[ ]'  Hemoccult stool (pending) -> notably positive in June -Type and Screen and Transfused 1 unit of pRBC on 8/17 -Has some Hematuria and dark urine, but recent UA from 8/24 without RBC's. -Repeat CBC in AM  COPD Stable. -continue nebulizers  Acute Kidney Injury -  overall improved (0.96), peaked at 1.5,  -Suspected Secondary to hypotension and Hx of Atrophic Left Kidney -Continue to Monitor and Avoid Nephrotoxics -Repeat CMP in AM   Anasarca  -Secondary to IV fluids. Recent echo significant for grade 1 diastolic dysfunction.Repeat echocardiogram significant for an EF of 65-70% with grade 1 diastolic dysfunction and trace mitral regurg and tricuspid regurg - resuming lasix with tachycardia (previously on 40 mg PO daily); Given IV Albumin on 09/20/16 and 8/24 and 8/25 as well as 1 L NS yesterday  -Albumin 2.1 -CT scan showed Small amount of abdominal and pelvic ascites. Diffuse body wall edema consistent with anasarca  Oliguria?  Urinary retention - Foley was placed on 8/25 for urinary retention. Only 300 cc recorded yesterday.  Creatinine is still normal. Continue to monitor.  F/u response to lasix.    - bladder scans and place foley for retention '[ ]'  consider removing foley after 48-72 hours Normal renal function -strict in/out (most appear unmeasured)   Hematuria  Proteinuria '[ ]'  repeat UA with 30 mg/dl protein and small hb on dipstick, but 0 RBC's -Tea colored urine. Decreased urine outputinitially which is now improved. Liver unremarkable for acute process. Vascular surgery without recommendations for thrombosis on consultation. -CT Scan showed Negative for nephrolithiasis, hydronephrosis or ureteral stone -May need to discuss with Urology -replacing foley due to urinary retention above  Hyponatremia - improved.  Likely secondary to third spacing. -Continue to Monitor and repeat CMP in AM   Elevated alkaline phosphatase S/p cholecystecomy.  GGT elevated. Alkaline phosphatase woresned from 345 -> 460 -> 329 -> 488 -> 756 today. '[ ]'  ggt today  -RUQ unremarkable except for fatty liver - Repeat tomorrow, consider GI c/s if continuing to rise  Initial Abnormal CT angiogram of chest/abdomen/pelvis -Thrombus of left renal artery. Vascular surgery consulted with no recommendations for acute management. -Repeat CT Scan showed Displaced wall calcification in the infrarenal abdominal aorta consistent with known thrombosed dissection.  Thrombocytopenia -Presumed secondary to acute illness.  -Platelet Count now 100 -Dr. Lonny Prude had discussed with hematologist, Dr. Julien Nordmann who recommends continued surveillance.  -Can follow-up as an outpatient if not improved by discharge. -Pathology smear review showed Leukocytosis with predominance of neutrophils with toxic features. Rare circulating myelocyte.  -Repat CBC in AM   Bruising: may be related to thrombocytopenia above, possibly nutrition status.  Will continue to monitor.   GERD Stable. -continue PPI  Nausea  Abdominal discomfort:  Nausea with feeds.  Vomiting.  abdominal discomfort seems at baseline.  This started after feeds started.  Will slow down rate. - Ctm  Depression/Anxiety -Discontinued  Klonipin -Consulted Psychiatry Dr. Louretta Shorten and appreciated Assistance -Psychiatry changed Xanax 0.25 mg BID and discontinued Celexa; Patient was started on Cymbalta 40 mg po Daily for Depression  -C/w Marinol for Appetite  -Very difficult to Motivate and does not want to participate in Care '[ ]'  Consider remeron as above '[ ]'  paged psych  [place] type B aortic dissection -Patient is s/p stent graft placement on previous admission. -CT showed stable non contrasted appearance of the stent graft at the aortic arch and proximal descending thoracic aorta -Continue metoprolol at reduce dose  Leukocytosis -improved  -Inital Blood cultures negative and Repeat showed 1/2 Yeast.   Repeat from 8/19 is NGTD.  -Pro calcitonin 0.24 on admission. -Repeat Imaging showed Bilateral pleural effusions, small on the right and moderate on the left. Worsening consolidation in the lower lobes may reflect atelectasis or pneumonia. -Patient Currently on fluconazole  Hypokalemia - improved today -Continue to Monitor and Replete as Necessary -Repeat CMP in AM  Hypophosphatemia, improving -d/c K phos will ctm -Continue to Monitor and Replete as Necessary -Repeat Phos Level in AM   Hypomagnesemia -Continue to Monitor and Replete as Necessary -Repeat Mag Level in AM  Pleural Effusion on Left 8/26 Repeat CT with R and L pleural effusion (R with slight decrease in size, L described as small moderate simiar to prior)  -U/S Guided Thoracentesis Ordered 09/19/17 -Resulted 0.65 mL of Fluid Removal -Repeat CXR 8/18 showed No change from previous day's study. 2. Persistent left lung base opacity consistent with combination atelectasis and Kelisha Dall probable small effusion   DVT prophylaxis: heparin Code Status: full  Family Communication:  Husband, daugther Disposition Plan: pending improvement in PO, functional status   Consultants:   ID, nutrition, psych, palliative care  Procedures: (Don't include imaging studies which can be auto populated. Include things that cannot be auto populated i.e. 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  Continued thrombosis of L renal artery with progressive atrophy of L kidney.  Fatty infiltration of liver.  Wall enhancement of several loos of small bowel and large bowe (colitis or enteritis).  Interval development of linear filling defect in R external iliac atery (thrombus, dissection?) potentiall related to stent placement.   Antimicrobials: (specify start and planned stop date. Auto populated tables are space occupying and do not give end dates)  fluconazole    Subjective: N/V this morning Feels SOB, but satting normally on RA (with [place] in now).  No CP.  Feels about the same as yesterday.  Abd discomfort.  Had 1 stool within past 24 hrs    Objective: Vitals:   09/27/16 2015 09/27/16 2250 09/28/16 0558 09/28/16 1100  BP: 132/71 129/65 108/82 110/78  Pulse: 72 (!) 117 100 (!) 110  Resp: '18  18 18  ' Temp: (!) 97.5 F (36.4 C)  98.4 F (  36.9 C) 98.6 F (37 C)  TempSrc: Oral  Oral Oral  SpO2: 95%  96% 96%  Weight:   71.8 kg (158 lb 3.2 oz)   Height:        Intake/Output Summary (Last 24 hours) at 09/28/16 1326 Last data filed at 09/28/16 1000  Gross per 24 hour  Intake          1029.58 ml  Output              350 ml  Net           679.58 ml   Filed Weights   09/26/16 0558 09/27/16 0600 09/28/16 0558  Weight: 70.8 kg (156 lb) 71.2 kg (156 lb 14.4 oz) 71.8 kg (158 lb 3.2 oz)    Examination:  General: No acute distress.  Chronically ill.  Cardiovascular: Heart sounds show Cj Beecher tachycardic rate, and rhythm. No gallops or rubs. No murmurs. Lungs: Crackles most prominent towards bases.  Unlabored.  Abdomen: Mildly diffusely tender. No masses. No hepatosplenomegaly. Neurological: Cranial nerves II through XII grossly intact. Skin: Warm and dry.  Bruising Extremities: No clubbing or cyanosis. 3+ LEE edema. Pedal pulses 2+. Psychiatric: depressed  Data Reviewed: I have personally reviewed following labs and imaging studies  CBC:  Recent Labs Lab 09/22/16 0433 09/23/16 0315 09/24/16 1214 09/25/16 0750 09/25/16 1210 09/26/16 0451 09/27/16 1151 09/28/16 0731  WBC 5.3 5.0 7.6 6.7  --  6.1 7.1 9.5  NEUTROABS 3.9 3.3  --   --   --   --   --   --   HGB 8.1* 8.3* 9.3* 7.4* 7.7* 7.3* 8.3* 8.2*  HCT 24.6* 24.8* 28.8* 21.9* 23.8* 22.8* 24.9* 26.2*  MCV 92.5 93.9 94.7 98.2  --  99.6 102.0* 100.8*  PLT 73* 72* 100* 85*  --  83* 98* 945*   Basic Metabolic Panel:  Recent Labs Lab 09/24/16 1214 09/25/16 0750 09/26/16 0451 09/27/16 0610 09/28/16 0731  NA 136 136 136 138 140  K 3.9 3.4* 3.2* 3.5 4.1  CL 106 105 105 107 108  CO2 21* '24 24 24 23  ' GLUCOSE 104* 75 125* 133* 138*  BUN 21* '20 15 12 15  ' CREATININE 1.16* 1.06* 0.96 0.87 0.89  CALCIUM 7.2* 7.0* 7.2* 7.9* 7.6*  MG 1.7 1.5* 1.9 1.6* 1.7  PHOS 3.9 4.1 3.7 3.3 4.0   GFR: Estimated Creatinine Clearance: 63.9 mL/min (by C-G formula based on SCr of 0.89 mg/dL). Liver Function Tests:  Recent Labs Lab 09/24/16 1214 09/25/16 0750 09/26/16 0451 09/27/16 0610 09/28/16 0731  AST '29 20 17 21 25  ' ALT 14 11* 11* 11* 13*  ALKPHOS 460* 344* 329* 488* 756*  BILITOT 0.7 0.8 0.7 1.0 1.2  PROT 4.7* 4.1* 4.4* 5.2* 4.9*  ALBUMIN 1.3* 1.2* 1.7* 2.1* 1.9*   No results for input(s): LIPASE, AMYLASE in the last 168 hours. No results for input(s): AMMONIA in the last 168 hours. Coagulation Profile: No results for input(s): INR, PROTIME in the last 168 hours. Cardiac Enzymes: No results for input(s): CKTOTAL, CKMB, CKMBINDEX, TROPONINI in the last 168 hours. BNP (last 3 results) No results for input(s): PROBNP in the last 8760 hours. HbA1C: No results for input(s): HGBA1C in the last 72 hours. CBG:  Recent Labs Lab 09/27/16 1109 09/27/16 1641 09/27/16 2117 09/28/16 0708  09/28/16 1152  GLUCAP 149* 128* 115* 144* 127*   Lipid Profile: No results for input(s): CHOL, HDL, LDLCALC, TRIG, CHOLHDL, LDLDIRECT in the last 72 hours. Thyroid Function  Tests: No results for input(s): TSH, T4TOTAL, FREET4, T3FREE, THYROIDAB in the last 72 hours. Anemia Panel: No results for input(s): VITAMINB12, FOLATE, FERRITIN, TIBC, IRON, RETICCTPCT in the last 72 hours. Sepsis Labs: No results for input(s): PROCALCITON, LATICACIDVEN in the last 168 hours.  Recent Results (from the past 240 hour(s))  Culture, blood (Routine X 2) w Reflex to ID Panel     Status: None   Collection Time: 09/20/16  3:01 PM  Result Value Ref Range Status   Specimen Description BLOOD BLOOD RIGHT HAND  Final   Special Requests IN PEDIATRIC BOTTLE Blood Culture adequate volume  Final   Culture NO GROWTH 5 DAYS  Final   Report Status 09/25/2016 FINAL  Final         Radiology Studies: Ct Angio Chest Pe W Or Wo Contrast  Result Date: 09/27/2016 CLINICAL DATA:  Tachycardia, history of recent thoracentesis EXAM: CT ANGIOGRAPHY CHEST WITH CONTRAST TECHNIQUE: Multidetector CT imaging of the chest was performed using the standard protocol during bolus administration of intravenous contrast. Multiplanar CT image reconstructions and MIPs were obtained to evaluate the vascular anatomy. CONTRAST:  100 mL Isovue 370 intravenous COMPARISON:  09/17/2016, 09/15/2016, 08/05/2016 FINDINGS: Cardiovascular: Limited evaluation of distal branch vessels in the bilateral lower lobes, secondary to consolidations. No definite central embolus is visualized. Re- demonstrated aortic stent graft in the thoracic arch and terminating in the descending thoracic aorta for type B dissection. Faintly visualized dissection flap in the distal descending thoracic aorta. Stable heart size. No large pericardial effusion. Scattered atherosclerotic calcifications within the aorta Mediastinum/Nodes: No thyroid mass. Midline trachea. Esophageal  tube partially visualized in the stomach. No significantly enlarged mediastinal lymph nodes. Lungs/Pleura: Small moderate left pleural effusion, similar compared to prior. Small right-sided pleural effusion, stable to slight decrease. Patchy consolidations within the lower lobes, atelectasis or pneumonia. Negative for pneumothorax. Scattered hazy densities suggestive of edema. Upper Abdomen: Enlarged stomach.  No acute abnormality. Musculoskeletal: No chest wall abnormality. No acute or significant osseous findings. Review of the MIP images confirms the above findings. IMPRESSION: 1. Limited assessment of distal branch vessels in the bilateral lower lobes secondary to consolidations. No definite acute central embolus is visualized. 2. Grossly stable aortic stent graft for type B dissection with faintly visualized dissection flap in the distal descending thoracic aorta 3. Slight decrease in size of right pleural effusion. Small moderate left pleural effusions similar compared to prior. Negative for pneumothorax. Patchy left greater than right lower lobe consolidation may reflect atelectasis or pneumonia. Development of hazy ground-glass densities within the bilateral lungs suspicious for mild edema 4. Enlarged stomach with partially visualized esophageal tube. Aortic Atherosclerosis (ICD10-I70.0). Electronically Signed   By: Donavan Foil M.D.   On: 09/27/2016 20:56   Dg Chest Port 1 View  Result Date: 09/26/2016 CLINICAL DATA:  Lung crackles EXAM: PORTABLE CHEST 1 VIEW COMPARISON:  09/19/2016 FINDINGS: Prior aortic stent graft in the descending thoracic aorta. Heart is normal size. Bibasilar airspace opacities, left greater than right, worsening on the left since prior study. This could represent atelectasis although left lower lobe pneumonia cannot be excluded. Possible small left effusion. No acute bony abnormality. IMPRESSION: Bibasilar atelectasis or infiltrates, left greater than right. Possible small left  effusion. Electronically Signed   By: Rolm Baptise M.D.   On: 09/26/2016 17:17        Scheduled Meds: . ALPRAZolam  0.25 mg Oral BID  . ARIPiprazole  5 mg Oral Daily  . aspirin EC  81 mg  Oral Daily  . dronabinol  5 mg Oral BID AC  . DULoxetine  40 mg Oral Daily  . erythromycin  250 mg Oral Q8H  . feeding supplement (ENSURE ENLIVE)  237 mL Oral TID BM  . feeding supplement (PRO-STAT SUGAR FREE 64)  30 mL Per Tube Daily  . feeding supplement (VITAL 1.5 CAL)  1,000 mL Per Tube Q24H  . fluconazole  400 mg Oral Daily  . heparin  5,000 Units Subcutaneous Q8H  . insulin aspart  0-5 Units Subcutaneous QHS  . insulin aspart  0-9 Units Subcutaneous TID WC  . lidocaine  1 application Urethral Once  . metoprolol tartrate  12.5 mg Oral BID  . pantoprazole  40 mg Oral QHS  . sodium chloride flush  3 mL Intravenous Q12H   Continuous Infusions:    LOS: 19 days    Time spent: 35 minutes    Javed Cotto Melven Sartorius, MD [facility] Pager [phone]  If 7PM-7AM, please contact night-coverage [email or web address] Password [email or web address] 09/28/2016, 1:26 PM

## 2016-09-28 NOTE — Progress Notes (Signed)
Patient complained of not breathing well, O2 sats 97%, patient not in distress.

## 2016-09-29 ENCOUNTER — Inpatient Hospital Stay (HOSPITAL_COMMUNITY): Payer: BLUE CROSS/BLUE SHIELD

## 2016-09-29 DIAGNOSIS — I341 Nonrheumatic mitral (valve) prolapse: Secondary | ICD-10-CM

## 2016-09-29 LAB — CBC
HCT: 25 % — ABNORMAL LOW (ref 36.0–46.0)
Hemoglobin: 7.8 g/dL — ABNORMAL LOW (ref 12.0–15.0)
MCH: 32.1 pg (ref 26.0–34.0)
MCHC: 31.2 g/dL (ref 30.0–36.0)
MCV: 102.9 fL — ABNORMAL HIGH (ref 78.0–100.0)
PLATELETS: 95 10*3/uL — AB (ref 150–400)
RBC: 2.43 MIL/uL — ABNORMAL LOW (ref 3.87–5.11)
RDW: 24.8 % — AB (ref 11.5–15.5)
WBC: 8.1 10*3/uL (ref 4.0–10.5)

## 2016-09-29 LAB — GLUCOSE, CAPILLARY
GLUCOSE-CAPILLARY: 151 mg/dL — AB (ref 65–99)
GLUCOSE-CAPILLARY: 125 mg/dL — AB (ref 65–99)
Glucose-Capillary: 129 mg/dL — ABNORMAL HIGH (ref 65–99)
Glucose-Capillary: 137 mg/dL — ABNORMAL HIGH (ref 65–99)

## 2016-09-29 LAB — ECHOCARDIOGRAM COMPLETE
AOASC: 27 cm
AVLVOTPG: 6 mmHg
CHL CUP MV DEC (S): 164
E decel time: 164 msec
EERAT: 11.49
FS: 27 % — AB (ref 28–44)
HEIGHTINCHES: 63 in
IVS/LV PW RATIO, ED: 0.8
LA diam end sys: 34 mm
LA diam index: 1.97 cm/m2
LA vol A4C: 39.3 ml
LA vol: 50.6 mL
LASIZE: 34 mm
LAVOLIN: 29.2 mL/m2
LV E/e' medial: 11.49
LV E/e'average: 11.49
LV e' LATERAL: 9.57 cm/s
LVOT SV: 67 mL
LVOT VTI: 26.3 cm
LVOT area: 2.54 cm2
LVOT diameter: 18 mm
LVOT peak vel: 123 cm/s
Lateral S' vel: 13.6 cm/s
MV Peak grad: 5 mmHg
MV pk A vel: 143 m/s
MV pk E vel: 110 m/s
PW: 10 mm — AB (ref 0.6–1.1)
TAPSE: 16.9 mm
TDI e' lateral: 9.57
TDI e' medial: 4.11
WEIGHTICAEL: 2452.8 [oz_av]

## 2016-09-29 LAB — COMPREHENSIVE METABOLIC PANEL
ALBUMIN: 1.7 g/dL — AB (ref 3.5–5.0)
ALK PHOS: 660 U/L — AB (ref 38–126)
ALT: 13 U/L — AB (ref 14–54)
AST: 23 U/L (ref 15–41)
Anion gap: 7 (ref 5–15)
BUN: 15 mg/dL (ref 6–20)
CALCIUM: 6.9 mg/dL — AB (ref 8.9–10.3)
CO2: 25 mmol/L (ref 22–32)
CREATININE: 0.86 mg/dL (ref 0.44–1.00)
Chloride: 107 mmol/L (ref 101–111)
GFR calc non Af Amer: >60 mL/min (ref >60)
GLUCOSE: 127 mg/dL — AB (ref 65–99)
Potassium: 3.6 mmol/L (ref 3.5–5.1)
SODIUM: 139 mmol/L (ref 135–145)
Total Bilirubin: 1 mg/dL (ref 0.3–1.2)
Total Protein: 4.5 g/dL — ABNORMAL LOW (ref 6.5–8.1)

## 2016-09-29 LAB — MAGNESIUM: MAGNESIUM: 1.5 mg/dL — AB (ref 1.7–2.4)

## 2016-09-29 LAB — PHOSPHORUS: Phosphorus: 3.8 mg/dL (ref 2.5–4.6)

## 2016-09-29 MED ORDER — FUROSEMIDE 10 MG/ML IJ SOLN
40.0000 mg | Freq: Once | INTRAMUSCULAR | Status: AC
  Administered 2016-09-29: 40 mg via INTRAVENOUS
  Filled 2016-09-29: qty 4

## 2016-09-29 MED ORDER — FUROSEMIDE 40 MG PO TABS
40.0000 mg | ORAL_TABLET | Freq: Two times a day (BID) | ORAL | Status: DC
  Administered 2016-09-30: 40 mg via ORAL
  Filled 2016-09-29: qty 1

## 2016-09-29 MED ORDER — ONDANSETRON HCL 4 MG PO TABS
4.0000 mg | ORAL_TABLET | Freq: Two times a day (BID) | ORAL | Status: DC | PRN
  Administered 2016-09-29 – 2016-10-01 (×4): 4 mg via ORAL
  Filled 2016-09-29 (×4): qty 1

## 2016-09-29 MED ORDER — MAGNESIUM SULFATE 4 GM/100ML IV SOLN
4.0000 g | Freq: Once | INTRAVENOUS | Status: AC
  Administered 2016-09-29: 4 g via INTRAVENOUS
  Filled 2016-09-29: qty 100

## 2016-09-29 NOTE — Progress Notes (Signed)
Patient much more awake this morning, able to tak e all her morning meds without difficulty.

## 2016-09-29 NOTE — Progress Notes (Signed)
Patient is more alert, cooperative and talkative today.

## 2016-09-29 NOTE — Progress Notes (Signed)
  Echocardiogram 2D Echocardiogram has been performed.  Haley Graham 09/29/2016, 3:43 PM

## 2016-09-29 NOTE — Progress Notes (Signed)
Occupational Therapy Treatment Patient Details Name: Haley Graham MRN: [id number] DOB: [birth year] Today's Date: 09/29/2016    History of present illness This 61 y.o. female admitted with sepsis, hypotension, dehydration, amemia, COPD, AKI due to poor oral intake and hypotension/dehydration.  PMH  includes:  Recent prolonged hospitalization for [facility] type B aortic dissection s/p descending thoracic aortic stent graft placement; anxiety, GERD, COPD,    OT comments  Pt more interactive today, and even smiled x 2!!  Upon my entrance, she stated "I want to try to walk".   She required mod A to move to EOB, and was able to sit EOB x 10 mins.  Pt with significant anxiety upon sitting up, but was easily settled and redirected with deep breathing enabling her continue to work with OT.  She stood briefly x 3 with mod A, but was unable to stand longer than 10 seconds due to weakness.  She seemed pleased with her attempts today, thanked OT, and asked if we could try again tomorrow!!!  Goals downgraded due to level of weakness, will continue to follow.   Follow Up Recommendations  SNF;Other (comment)    Equipment Recommendations  None recommended by OT    Recommendations for Other Services      Precautions / Restrictions Precautions Precautions: Fall       Mobility Bed Mobility Overal bed mobility: Needs Assistance Bed Mobility: Supine to Sit;Sit to Supine     Supine to sit: Mod assist Sit to supine: Max assist   General bed mobility comments: she requires cues for sequencing and assist to move LEs off bed and and to lift her trunk as well as needed assist to lift LEs back onto bed   Transfers Overall transfer level: Needs assistance Equipment used: 1 person hand held assist Transfers: Sit to/from Stand   Stand pivot transfers: Mod assist       General transfer comment: Pt stood x 3 with mod A for very brief periods of time before needing to sit due to weakness.  Pt willing to  try again after each attempt     Balance Overall balance assessment: Needs assistance Sitting-balance support: Bilateral upper extremity supported;Feet supported Sitting balance-Leahy Scale: Fair Sitting balance - Comments: Pt sat EOB x 10 mins with min guard assist.  Pt initially with rapid breathing yelling "I can't breathe, I need to lay down".  with encouragement pt able to engage in deep breathing techniques to slow breathing down allowing her to continue to participate    Standing balance support: Bilateral upper extremity supported Standing balance-Leahy Scale: Poor Standing balance comment: Pt stood x 3 with mod A for very brief periods of time before needing to sit due to weakness.  Pt willing to try again after each attempt                            ADL either performed or assessed with clinical judgement   ADL                           Toilet Transfer: Total assistance Toilet Transfer Details (indicate cue type and reason): unable to perform this date due to weakness                  Vision       Perception     Praxis      Cognition Arousal/Alertness: Awake/alert Behavior During Therapy:  Flat affect Overall Cognitive Status: Impaired/Different from baseline Area of Impairment: Attention;Problem solving;Following commands                   Current Attention Level: Selective;Sustained Memory: Decreased short-term memory Following Commands: Follows one step commands consistently Safety/Judgement: Decreased awareness of deficits   Problem Solving: Slow processing;Difficulty sequencing;Requires verbal cues;Decreased initiation;Requires tactile cues General Comments: Pt brighter today.  Smiled x 2.  more interactive and more appropriate.  She demonstrated increased anxiety upon sitting EOB, but was able to focus on deep breathing techniques         Exercises     Shoulder Instructions       General Comments HR 112     Pertinent Vitals/ Pain       Pain Assessment: Faces Faces Pain Scale: Hurts little more Pain Location: back and abdomen Pain Descriptors / Indicators: Grimacing;Guarding Pain Intervention(s): Limited activity within patient's tolerance  Home Living                                          Prior Functioning/Environment              Frequency  Min 2X/week        Progress Toward Goals  OT Goals(current goals can now be found in the care plan section)  Progress towards OT goals: Progressing toward goals (goals updated )  Acute Rehab OT Goals Patient Stated Goal: to walk  OT Goal Formulation: With patient Time For Goal Achievement: 10/13/16 Potential to Achieve Goals: Fair ADL Goals Pt Will Perform Grooming: with min assist;sitting Pt Will Perform Upper Body Bathing: with min assist;sitting Pt Will Perform Lower Body Bathing: with mod assist;sit to/from stand Pt Will Perform Upper Body Dressing: with mod assist;sitting Pt Will Perform Lower Body Dressing: with max assist;sit to/from stand Pt Will Transfer to Toilet: with min assist;stand pivot transfer;bedside commode Pt Will Perform Toileting - Clothing Manipulation and hygiene: with mod assist;sit to/from stand  Plan Discharge plan remains appropriate    Co-evaluation                 AM-PAC PT "6 Clicks" Daily Activity     Outcome Measure   Help from another person eating meals?: A Lot Help from another person taking care of personal grooming?: A Lot Help from another person toileting, which includes using toliet, bedpan, or urinal?: A Lot Help from another person bathing (including washing, rinsing, drying)?: Total Help from another person to put on and taking off regular upper body clothing?: Total Help from another person to put on and taking off regular lower body clothing?: Total 6 Click Score: 9    End of Session Equipment Utilized During Treatment: Gait belt  OT Visit  Diagnosis: Unsteadiness on feet (R26.81);Muscle weakness (generalized) (M62.81)   Activity Tolerance Patient limited by lethargy;Other (comment) (weakness )   Patient Left in bed;with call bell/phone within reach;with family/visitor present   Nurse Communication Mobility status        Time: 3810-1751 OT Time Calculation (min): 22 min  Charges: OT General Charges $OT Visit: 1 Visit OT Treatments $Therapeutic Activity: 8-22 mins  [facility], OTR/L [phone]    Lucille Passy M 09/29/2016, 2:53 PM

## 2016-09-29 NOTE — Progress Notes (Signed)
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 She presented with weakness and met sepsis criteria with concern for enteritis.  She was started on broad spectrum antibiotics.   On 8/16 had worsening creatinine and leukocytosis.  She was re-cultured and imaging was repeated.  Imaging was notable for bilateral pleural effusions and she had Haley Graham thoracentesis on 8/17.  She was also transfused 1 unit of pRBC.    The patient was started on fluconazole for Haley Graham yeast UTI, but on 8/18 1/2 blood cx grew yeast and the patient was started on eraxis.  Sensitivies showed candidemia and she was switched back to eraxis.  ID was consulted.  Antibiotics (cefepime/flagyl) were discontinued on 8/20 with blood cultures only growing candida.  ID recommended continuing fluconazole 400 mg daily through 9/26.  They recommended she have an ophthalmologic exam within the next month.  They had initially recommended repeat imaging for the graft, but I discussed this with Dr. Megan Graham who felt it was probably unnecessary to repeat imaging (CT or echo) - (although she had CT PE protocol for tachycardia on 8/26 that showed Haley Graham "grossly stable stent graft").  She began to become tachycardic around 8/24.  She had been receiving diuresis with lasix for her anasarca.  She was suspected to be intravascularly depleted and lasix was held and she received albumin on the 24th and 25th.  On the 26th, she was persistently tachy and received 1 L NS prior to getting Haley Graham CT  PE protocol which was negative for PE (it did show persistent bilateral pleural effusions and atelectasis vs pneumonia as well as concern for mild edema.  ProBNP was elevated and she received 40 IV lasix on 8/27 and 8/28 with marked improvement in her swelling and symptoms.  Haley Graham repeat echo was obtained that showed Haley Graham severely reduced EF of 30-35%, akinesis of of mid-apicalanteroseptal and anterior  myocardium.  Psychiatry has been following and has adjusted her meds.  Vyvanse seems to be effective as on 8/28, she has looked the best that she has all week.  Palliative care is following as well.  Regarding her nutrition, we trialed the cortrak, which has not gone well.  She developed nausea/vomiting with feeds and these have been stopped.  Would consider d/c cortrak if not using in 8/29.    She's been depressed and difficult to motivate throughout the hospitalization, but appears better with vyvanse.   On 8/28 overall she looked much better, improved swelling after lasix and more interactive, but echo with decreased EF and EKG with prolonged QTC. Will need to discuss with cards.  Assessment & Plan:   Principal Problem:   Candidemia ([place]) Active Problems:   GERD (gastroesophageal reflux disease)   Dissection of thoracoabdominal aorta (HCC)   AAA (abdominal aortic aneurysm) (HCC)   Benign essential HTN   Diabetes mellitus type 2 in obese (HCC)   Anxiety state   Hyponatremia   Leukocytosis   Sepsis (HCC)   Hypotension   AKI (acute kidney injury) ([place])   Dehydration   Generalized weakness   Anemia   Thrombocytopenia (HCC)   Abnormal computed tomography  angiography (CTA) of abdomen and pelvis   Colitis: Probable   Protein-calorie malnutrition, severe   Palliative care by specialist   Adult failure to thrive   Hypophosphatemia   Hypomagnesemia   Depression   Candida Albicans Bacteremia  Resolved sepsis -Initially suspected econdary to colitis/enteritis but patient found to have Haley Graham  Bacteremia with Yeast and grew out Candida Albicans urine from 8/15 notably also grew out candida albicans (09/16/16 - repeat blood culture from 8/19 NGTD x 5 days) - fluconazole - > eraxis -> fluconazole to complete course on 9/26 -GI pathogen panel and C. Diff negative.  -IV Flagyl and Cefepime now Discontinued per ID Recc's as she has had adequate Treatment - Initially thought she would need reimaging of Graft as she is at high risk of seeding and likely repeat Limited ECHO --- I discussed this 8/24 with ID (Dr. Megan Graham) who noted she probably will not need reimaging given previous exams (CTA for PE r/o done 8/26 with stable aortic stent graft and faintly visualized dissection flap in distal descending thoracic aorta) '[ ]'  Will need Opthamology Consult for Dilated Opthalmologic exam and ID recommending within the next few weeks (noted this on 8/20)  HFrEF (EF 41-74%)  Diastolic dysfunction: New, with akinesis of of mid-apicalanteroseptal and anteriormyocardium.  Most recent EKG with new T wave inversions since 8/27. - new 8/28 on echo - continue lasix, convert to 40 PO BID '[ ]'  cards c/s  Tachycardia - Improved today, still mildly elevated.  Suspect improved with lasix.  Developed around the 24th and has been persistent since then.  Initially thought due to intravascular depletion, but no real improvement with albumin given 8/24 and 8/25.  Gave 1 L NS on 8/26, but still no improvement. '[ ]'  f/u proBNP - elevated to over 1000 '[ ]'  repeat echo (as above) - was thought to have rebound earlier secondary to abrupt cessation of beta-blocker. - continue metoprolol at reduced dose of 12.39m BID  Atrial fibrillation: chad2vasc 3 (HF, F, and hx HTN).   - new on EKG - consider anticoagulation  Prolonged QTc: 538 today, on multiple QT prolonging meds including erythromycin, which I've now discontinued.  Also on cymbalta and abilify (cymbalta increased yesterday).  Fluconazole as well which should be  continued with candidemia.  I'll touch base with psych today about these meds (unable to reach by pager), but will check PM ekg and AM ekg, hopefully discontinuing erythromycin will be sufficient.  Change zofran to BID PRN and d/c IV.  D/C phenergan.  Has not received tramadol in 24 hours will dc this.  Mag low and repleting.  Candidemia/Yeast UTI -As above -Continue Fluconazole per ID to complete 9/26 -reimaging of graft likely not needed per my discussion with Dr. CMegan Graham -Will also need Opthalmologic Evaluation at some point  -Infectious Disease has signed off  L>R lower lobe consolidation on 8/26 CT  Crackles: suspect volume overload given above, low suspicion for pneumonia, per nursing O2 just for comfort. - lasix as above '[ ]'  daily CBC, watch temp  Hypotension - maps > 65 recently, asx ctm -Improved with IV fluids which has made her anasarca worse. Troponin negative. Albumin extremely low. -Gave 1x dose of IV Albumin on 09/19/16 and additional 8/24 for hypotension with mild tachycardia, ctm.  Repeat albumin 8/25.  -monitor BP  Dehydration/Poor oral intake/FTT  Cortrak in place:   Tube feeds still held.  Pt not interested in restarting today.  She's done poorly with feeds so far.  If not using tube feeds tomorrow would d/c cortrak -encourage oral intake -dietitian consulted: protein supplementation as patient has Significant Hypoalbuminemia and LE swelling  -Will add Marinol to help appetite Stimulant; Increased to 5 mg po BID '[ ]'  consider remeron in future, esp with mood -Albumin was 1.2 ->  1.7 '[x]'  cortrak placed (x ray with distal tip of feeding tube in 4th portion of duodenum), will start feeds per nutrition '[ ]'  monitor daily mag, phos, k for refeeding  Normocytic Anemia:  AOCD pattern, elevated B12, normal folate -stably low, 8.3 '[ ]'  Hemoccult stool (pending) -> notably positive in June -Type and Screen and Transfused 1 unit of pRBC on 8/17 -Has some Hematuria and  dark urine, but recent UA from 8/24 without RBC's. -Repeat CBC in AM  COPD Stable. -continue nebulizers  Acute Kidney Injury -  overall improved (0.96), peaked at 1.5,  -Suspected Secondary to hypotension and Hx of Atrophic Left Kidney -Continue to Monitor and Avoid Nephrotoxics -Repeat CMP in AM   Anasarca: related to low albumin, initial IVFs, initial echo with diastolic dysfunction, but repeat echo as above. - lasix as above -CT scan showed Small amount of abdominal and pelvic ascites. Diffuse body wall edema consistent with anasarca  Oliguria?  Urinary retention - Foley was placed on 8/25 for urinary retention. She'd had question of low urinary output, but maybe just retaining.  Discussed with urology and note that may consider removal after Ronika Kelson few days and seeing how she does (had removed foley before with plans to not replace, but given retention, we did).  Will leave in place for now given her HF. Normal renal function -strict in/out   Elevated alkaline phosphatase and GGT S/p cholecystecomy. Fluctuating.  Up to 700's, but unclear etiology.  Continue to monitor, consider discussing with GI if rising.  -RUQ unremarkable except for fatty liver - Repeat tomorrow, consider GI c/s if continuing to rise  Hematuria  Proteinuria: She was noted to have tea colored urine initially and decreased output.  UA with hematuria.  8/15 ua likely related to candidal UTI. '[x]'  repeat UA with 30 mg/dl protein and small hb on dipstick, but 0 RBC's, had considered discussing with urology, but probably ok to watch with improvement. -Tea colored urine. Decreased urine outputinitially which is now improved. Liver unremarkable for acute process.  -CT Scan showed Negative for nephrolithiasis, hydronephrosis or ureteral stone  Initial Abnormal CT angiogram of chest/abdomen/pelvis -Thrombus of left renal artery. Vascular surgery consulted with no recommendations for acute management. -Repeat CT Scan  showed Displaced wall calcification in the infrarenal abdominal aorta consistent with known thrombosed dissection.  Hyponatremia - improved.    Thrombocytopenia -Presumed secondary to acute illness.  -Platelet Count now 100 -Dr. Lonny Prude had discussed with hematologist, Dr. Julien Nordmann who recommends continued surveillance.  -Can follow-up as an outpatient if not improved by discharge. -Pathology smear review showed Leukocytosis with predominance of neutrophils with toxic features. Rare circulating myelocyte.  -Repat CBC in AM   Bruising: may be related to thrombocytopenia above, possibly nutrition status.  Will continue to monitor.   GERD -continue PPI  Nausea  Abdominal discomfort:  Nausea with feeds.  Vomiting.  abdominal discomfort seems at baseline.  This started after feeds started.  Will slow down rate. - Ctm  Depression/Anxiety -Discontinued Klonipin -Consulted Psychiatry Dr. Louretta Shorten and appreciated Assistance -Psychiatry changed Xanax 0.25 mg BID and discontinued Celexa; Patient was started on Cymbalta 40 mg po (increased to 60 mg yesterday) Daily for Depression.  Vyvanse started, which seems to have improved her interactiveness, but still reluctant to work with PT today. -C/w Marinol for Appetite  '[ ]'  Consider remeron as above  [place] type B aortic dissection -Patient is s/p stent graft placement on previous admission. -CT showed stable non contrasted appearance of the stent graft at the aortic arch and proximal descending thoracic aorta -Continue metoprolol at reduce dose  Pleural Effusion on Left 8/26 Repeat CT with R and L pleural effusion (R with slight decrease in size, L described as small moderate simiar to prior)  -U/S Guided Thoracentesis Ordered 09/18/17 -Resulted 0.65 mL of Fluid Removal -Repeat CXR 8/18 showed No change from previous day's study. 2. Persistent left lung base opacity consistent with combination atelectasis and Farron Watrous probable small  effusion  Leukocytosis -improved   Hypokalemia - improved today -Continue to Monitor and Replete as Necessary -Repeat CMP in AM  Hypophosphatemia, improving -d/c K phos will ctm -Continue to Monitor and Replete as Necessary -Repeat Phos Level in AM   Hypomagnesemia -Continue to Monitor and Replete as Necessary (4 g given 8/28) -Repeat Mag Level in AM  DVT prophylaxis: heparin Code Status: full  Family Communication:  Husband, daugther Disposition Plan: pending improvement in PO, functional status   Consultants:   ID, nutrition, psych, palliative care  Procedures: (Don't include imaging studies which can be auto populated. Include things that cannot be auto populated i.e. Echo, Carotid and venous dopplers, Foley, Bipap, HD, tubes/drains, wound vac, central lines etc)  Thoracentesis 8/17  Echo 8/28 EF 30-35%, akinesis of mid-apicalanteroseptal and anterior myocardium.  Diastolic dysfunction.  Echo 8/13 EF 33-35, diastolic dysfunction, lipomatous hypertrophy  Antimicrobials: (specify start and planned stop date. Auto populated tables are space occupying and do not give end dates)  fluconazole    Subjective: Feeling better this morning.  Thinks she's doing better.  No CP, SOB.  Doesn't want to work with PT/OT today.  Says she'll walk with her husband tonight.  Objective: Vitals:   09/28/16 1100 09/28/16 2000 09/29/16 0535 09/29/16 1142  BP: 110/78 (!) 122/57 (!) 104/46 (!) 92/44  Pulse: (!) 110 (!) 118 96 94  Resp: '18 18 20 20  ' Temp: 98.6 F (37 C) 98.6 F (37 C) 98.3 F (36.8 C) (!) 97.4 F (36.3 C)  TempSrc: Oral Oral Oral Oral  SpO2: 96% 100% 98% 99%  Weight:   69.5 kg (153 lb 4.8 oz)   Height:        Intake/Output Summary (Last 24 hours) at 09/29/16 1208 Last data filed at 09/29/16 0947  Gross per 24 hour  Intake              240 ml  Output             1250 ml  Net            -1010 ml   Filed Weights   09/27/16 0600 09/28/16 0558 09/29/16  0535  Weight: 71.2 kg (156 lb 14.4 oz) 71.8 kg (158 lb 3.2 oz) 69.5 kg (153 lb 4.8 oz)    Examination:  General: No acute distress.  More alert and interactive. Cardiovascular: Heart sounds show Christiaan Strebeck regular rate, and rhythm. No gallops or rubs. No murmurs. No JVD. Lungs: Crackles towards the bases.  Abdomen: Soft, nontender, nondistended with normal active bowel sounds. No masses. No hepatosplenomegaly. Neurological: Alert and oriented 3. Moves all extremities 4 with equal strength. Cranial nerves II through XII grossly intact. Skin: Warm and dry. No rashes or  lesions. Extremities: No clubbing or cyanosis. 3+ edema (subjectively improved from yesterday).    Data Reviewed: I have personally reviewed following labs and imaging studies  CBC:  Recent Labs Lab 09/23/16 0315  09/25/16 0750 09/25/16 1210 09/26/16 0451 09/27/16 1151 09/28/16 0731 09/29/16 0607  WBC 5.0  < > 6.7  --  6.1 7.1 9.5 8.1  NEUTROABS 3.3  --   --   --   --   --   --   --   HGB 8.3*  < > 7.4* 7.7* 7.3* 8.3* 8.2* 7.8*  HCT 24.8*  < > 21.9* 23.8* 22.8* 24.9* 26.2* 25.0*  MCV 93.9  < > 98.2  --  99.6 102.0* 100.8* 102.9*  PLT 72*  < > 85*  --  83* 98* 100* 95*  < > = values in this interval not displayed. Basic Metabolic Panel:  Recent Labs Lab 09/25/16 0750 09/26/16 0451 09/27/16 0610 09/28/16 0731 09/29/16 0607  NA 136 136 138 140 139  K 3.4* 3.2* 3.5 4.1 3.6  CL 105 105 107 108 107  CO2 '24 24 24 23 25  ' GLUCOSE 75 125* 133* 138* 127*  BUN '20 15 12 15 15  ' CREATININE 1.06* 0.96 0.87 0.89 0.86  CALCIUM 7.0* 7.2* 7.9* 7.6* 6.9*  MG 1.5* 1.9 1.6* 1.7 1.5*  PHOS 4.1 3.7 3.3 4.0 3.8   GFR: Estimated Creatinine Clearance: 65 mL/min (by C-G formula based on SCr of 0.86 mg/dL). Liver Function Tests:  Recent Labs Lab 09/25/16 0750 09/26/16 0451 09/27/16 0610 09/28/16 0731 09/29/16 0607  AST '20 17 21 25 23  ' ALT 11* 11* 11* 13* 13*  ALKPHOS 344* 329* 488* 756* 660*  BILITOT 0.8 0.7 1.0 1.2 1.0   PROT 4.1* 4.4* 5.2* 4.9* 4.5*  ALBUMIN 1.2* 1.7* 2.1* 1.9* 1.7*   No results for input(s): LIPASE, AMYLASE in the last 168 hours. No results for input(s): AMMONIA in the last 168 hours. Coagulation Profile: No results for input(s): INR, PROTIME in the last 168 hours. Cardiac Enzymes: No results for input(s): CKTOTAL, CKMB, CKMBINDEX, TROPONINI in the last 168 hours. BNP (last 3 results) No results for input(s): PROBNP in the last 8760 hours. HbA1C: No results for input(s): HGBA1C in the last 72 hours. CBG:  Recent Labs Lab 09/28/16 1152 09/28/16 1619 09/28/16 2202 09/29/16 0738 09/29/16 1116  GLUCAP 127* 130* 127* 129* 137*   Lipid Profile: No results for input(s): CHOL, HDL, LDLCALC, TRIG, CHOLHDL, LDLDIRECT in the last 72 hours. Thyroid Function Tests:  Recent Labs  09/28/16 2217  TSH 0.713   Anemia Panel: No results for input(s): VITAMINB12, FOLATE, FERRITIN, TIBC, IRON, RETICCTPCT in the last 72 hours. Sepsis Labs: No results for input(s): PROCALCITON, LATICACIDVEN in the last 168 hours.  Recent Results (from the past 240 hour(s))  Culture, blood (Routine X 2) w Reflex to ID Panel     Status: None   Collection Time: 09/20/16  3:01 PM  Result Value Ref Range Status   Specimen Description BLOOD BLOOD RIGHT HAND  Final   Special Requests IN PEDIATRIC BOTTLE Blood Culture adequate volume  Final   Culture NO GROWTH 5 DAYS  Final   Report Status 09/25/2016 FINAL  Final         Radiology Studies: Ct Angio Chest Pe W Or Wo Contrast  Result Date: 09/27/2016 CLINICAL DATA:  Tachycardia, history of recent thoracentesis EXAM: CT ANGIOGRAPHY CHEST WITH CONTRAST TECHNIQUE: Multidetector CT imaging of the chest was performed using the standard protocol  during bolus administration of intravenous contrast. Multiplanar CT image reconstructions and MIPs were obtained to evaluate the vascular anatomy. CONTRAST:  100 mL Isovue 370 intravenous COMPARISON:  09/17/2016,  09/14/2016, 08/05/2016 FINDINGS: Cardiovascular: Limited evaluation of distal branch vessels in the bilateral lower lobes, secondary to consolidations. No definite central embolus is visualized. Re- demonstrated aortic stent graft in the thoracic arch and terminating in the descending thoracic aorta for type B dissection. Faintly visualized dissection flap in the distal descending thoracic aorta. Stable heart size. No large pericardial effusion. Scattered atherosclerotic calcifications within the aorta Mediastinum/Nodes: No thyroid mass. Midline trachea. Esophageal tube partially visualized in the stomach. No significantly enlarged mediastinal lymph nodes. Lungs/Pleura: Small moderate left pleural effusion, similar compared to prior. Small right-sided pleural effusion, stable to slight decrease. Patchy consolidations within the lower lobes, atelectasis or pneumonia. Negative for pneumothorax. Scattered hazy densities suggestive of edema. Upper Abdomen: Enlarged stomach.  No acute abnormality. Musculoskeletal: No chest wall abnormality. No acute or significant osseous findings. Review of the MIP images confirms the above findings. IMPRESSION: 1. Limited assessment of distal branch vessels in the bilateral lower lobes secondary to consolidations. No definite acute central embolus is visualized. 2. Grossly stable aortic stent graft for type B dissection with faintly visualized dissection flap in the distal descending thoracic aorta 3. Slight decrease in size of right pleural effusion. Small moderate left pleural effusions similar compared to prior. Negative for pneumothorax. Patchy left greater than right lower lobe consolidation may reflect atelectasis or pneumonia. Development of hazy ground-glass densities within the bilateral lungs suspicious for mild edema 4. Enlarged stomach with partially visualized esophageal tube. Aortic Atherosclerosis (ICD10-I70.0). Electronically Signed   By: Donavan Foil M.D.   On:  09/27/2016 20:56        Scheduled Meds: . ALPRAZolam  0.25 mg Oral QHS  . ARIPiprazole  5 mg Oral Daily  . aspirin EC  81 mg Oral Daily  . dronabinol  5 mg Oral BID AC  . DULoxetine  60 mg Oral Daily  . feeding supplement (ENSURE ENLIVE)  237 mL Oral TID BM  . feeding supplement (PRO-STAT SUGAR FREE 64)  30 mL Per Tube Daily  . feeding supplement (VITAL 1.5 CAL)  1,000 mL Per Tube Q24H  . fluconazole  400 mg Oral Daily  . heparin  5,000 Units Subcutaneous Q8H  . insulin aspart  0-5 Units Subcutaneous QHS  . insulin aspart  0-9 Units Subcutaneous TID WC  . lidocaine  1 application Urethral Once  . lisdexamfetamine  20 mg Oral Daily  . metoprolol tartrate  12.5 mg Oral BID  . pantoprazole  40 mg Oral QHS  . sodium chloride flush  3 mL Intravenous Q12H   Continuous Infusions: . magnesium sulfate 1 - 4 g bolus IVPB       LOS: 20 days    Time spent: 35 minutes    Taja Pentland Melven Sartorius, MD [facility] Pager [phone]  If 7PM-7AM, please contact night-coverage [email or web address] Password [facility] 09/29/2016, 12:08 PM

## 2016-09-29 NOTE — Progress Notes (Signed)
Patient ID: AMIRACLE NEISES, female   DOB: [birth year], 61 y.o.   MRN: [id number]  This NP visited patient at the bedside as a follow up for palliative needs and emotional support..  ( entry 1000 am)  Yesterday,  Vyvanse was started,  20 mg po 1  daily in hopes of increasing mood and motivation.  It seems to have helped, Shakaya is more alert and talkative today!  I discussed with Dr. Louretta Shorten, he verbalizes the importance of monitoring the effectiveness of the Vyvanse for efficacy over time. Discussed with nursing to monitor patient's alertness and mood throughout the day and to document.   Patient is encouraged to participate in physical therapy and self active range of motion exercises.   There is a friend at the bedside and she too will try to motivate patient to be more active today. I spoke with her husband by phone, he plans to be here this afternoon and continue to motivate his wife.  (entry 1400) Resting now but has been more interactive with staff and therapists.  Friend at bedside reports increased participation and increased oral intake today..  Reinforced again with the patient her role in her own well-being.   Will f/u in the morning.  Questions and concerns addressed  Discussed case with Dr Louretta Shorten and Dr Florene Glen  Time in    0940       Time out  1015  Time spent on the unit was 35 minutes Note late entrys  Greater than 50% of the time was spent in counseling and coordination of care  Wadie Lessen NP  Palliative Medicine Team Team Phone # [phone] Pager [phone]

## 2016-09-30 ENCOUNTER — Inpatient Hospital Stay (HOSPITAL_COMMUNITY): Payer: BLUE CROSS/BLUE SHIELD

## 2016-09-30 DIAGNOSIS — R9431 Abnormal electrocardiogram [ECG] [EKG]: Secondary | ICD-10-CM

## 2016-09-30 DIAGNOSIS — I5021 Acute systolic (congestive) heart failure: Secondary | ICD-10-CM

## 2016-09-30 DIAGNOSIS — F329 Major depressive disorder, single episode, unspecified: Secondary | ICD-10-CM

## 2016-09-30 DIAGNOSIS — J810 Acute pulmonary edema: Secondary | ICD-10-CM

## 2016-09-30 DIAGNOSIS — Z8679 Personal history of other diseases of the circulatory system: Secondary | ICD-10-CM

## 2016-09-30 LAB — COMPREHENSIVE METABOLIC PANEL
ALBUMIN: 1.5 g/dL — AB (ref 3.5–5.0)
ALK PHOS: 489 U/L — AB (ref 38–126)
ALT: 14 U/L (ref 14–54)
ANION GAP: 7 (ref 5–15)
AST: 20 U/L (ref 15–41)
BILIRUBIN TOTAL: 0.9 mg/dL (ref 0.3–1.2)
BUN: 15 mg/dL (ref 6–20)
CALCIUM: 7.1 mg/dL — AB (ref 8.9–10.3)
CO2: 25 mmol/L (ref 22–32)
Chloride: 105 mmol/L (ref 101–111)
Creatinine, Ser: 0.89 mg/dL (ref 0.44–1.00)
GFR calc non Af Amer: >60 mL/min (ref >60)
GLUCOSE: 95 mg/dL (ref 65–99)
Potassium: 3.4 mmol/L — ABNORMAL LOW (ref 3.5–5.1)
Sodium: 137 mmol/L (ref 135–145)
TOTAL PROTEIN: 4.5 g/dL — AB (ref 6.5–8.1)

## 2016-09-30 LAB — CBC
HCT: 23.4 % — ABNORMAL LOW (ref 36.0–46.0)
HEMOGLOBIN: 7.4 g/dL — AB (ref 12.0–15.0)
MCH: 31.9 pg (ref 26.0–34.0)
MCHC: 31.6 g/dL (ref 30.0–36.0)
MCV: 100.9 fL — ABNORMAL HIGH (ref 78.0–100.0)
Platelets: 94 10*3/uL — ABNORMAL LOW (ref 150–400)
RBC: 2.32 MIL/uL — ABNORMAL LOW (ref 3.87–5.11)
RDW: 23.5 % — ABNORMAL HIGH (ref 11.5–15.5)
WBC: 6.6 10*3/uL (ref 4.0–10.5)

## 2016-09-30 LAB — PHOSPHORUS: Phosphorus: 3.2 mg/dL (ref 2.5–4.6)

## 2016-09-30 LAB — MAGNESIUM: Magnesium: 1.8 mg/dL (ref 1.7–2.4)

## 2016-09-30 LAB — GLUCOSE, CAPILLARY
GLUCOSE-CAPILLARY: 84 mg/dL (ref 65–99)
GLUCOSE-CAPILLARY: 97 mg/dL (ref 65–99)
Glucose-Capillary: 124 mg/dL — ABNORMAL HIGH (ref 65–99)
Glucose-Capillary: 119 mg/dL — ABNORMAL HIGH (ref 65–99)

## 2016-09-30 MED ORDER — FUROSEMIDE 10 MG/ML IJ SOLN
40.0000 mg | Freq: Two times a day (BID) | INTRAMUSCULAR | Status: DC
  Administered 2016-09-30 – 2016-10-01 (×3): 40 mg via INTRAVENOUS
  Filled 2016-09-30 (×4): qty 4

## 2016-09-30 MED ORDER — POTASSIUM CHLORIDE CRYS ER 20 MEQ PO TBCR
40.0000 meq | EXTENDED_RELEASE_TABLET | Freq: Every day | ORAL | Status: DC
  Administered 2016-09-30: 40 meq via ORAL
  Filled 2016-09-30: qty 2

## 2016-09-30 MED ORDER — PRO-STAT SUGAR FREE PO LIQD
30.0000 mL | Freq: Three times a day (TID) | ORAL | Status: DC
  Administered 2016-10-01 – 2016-10-03 (×2): 30 mL via ORAL
  Filled 2016-09-30 (×6): qty 30

## 2016-09-30 MED ORDER — VITAL 1.5 CAL PO LIQD
1000.0000 mL | ORAL | Status: DC
  Administered 2016-09-30 – 2016-10-02 (×3): 1000 mL
  Filled 2016-09-30 (×6): qty 1000

## 2016-09-30 NOTE — Progress Notes (Signed)
Physical Therapy Treatment Patient Details Name: Haley Graham MRN: [id number] DOB: 12/19/1955 Today's Date: 09/30/2016    History of Present Illness This 61 y.o. female admitted with sepsis, hypotension, dehydration, amemia, COPD, AKI due to poor oral intake and hypotension/dehydration.  PMH  includes:  Recent prolonged hospitalization for [place] type B aortic dissection s/p descending thoracic aortic stent graft placement; anxiety, GERD, COPD,     PT Comments    Patient seen with OT. Pt agreeable to therapy and even made a joke today! Pt tolerated standing EOB X3 with +2 assist to take sidesteps toward [facility]. Pt continues to demonstrate increased desire to participate and become more independent. Husband present during session.  Continue to progress as tolerated.   Follow Up Recommendations  SNF     Equipment Recommendations  None recommended by PT    Recommendations for Other Services       Precautions / Restrictions Precautions Precautions: Fall    Mobility  Bed Mobility Overal bed mobility: Needs Assistance Bed Mobility: Supine to Sit;Sit to Supine     Supine to sit: Mod assist Sit to supine: +2 for physical assistance;Mod assist   General bed mobility comments: max cues for sequencing and technique; assist to mobilize bilat LE and elevate trunk into sitting with use of chuck pad for positioning EOB; pt with increased anxiety and RR upon sitting EOB but able to regulate breathing with and redirection; +2 to return to supine  Transfers Overall transfer level: Needs assistance Equipment used: 1 person hand held assist;2 person hand held assist Transfers: Sit to/from Stand Sit to Stand: Max assist;+2 physical assistance         General transfer comment: pt stood X3 from EOB; first trial with +1 but pt able to maintain standing for brief period; next 2 trials +2 face to face with gait belt and able to take sidesteps toward [facility]   Ambulation/Gait                  Stairs            Wheelchair Mobility    Modified Rankin (Stroke Patients Only)       Balance Overall balance assessment: Needs assistance Sitting-balance support: Bilateral upper extremity supported;Feet supported Sitting balance-Leahy Scale: Fair     Standing balance support: Bilateral upper extremity supported Standing balance-Leahy Scale: Poor                              Cognition Arousal/Alertness: Awake/alert Behavior During Therapy: Flat affect;WFL for tasks assessed/performed Overall Cognitive Status: Impaired/Different from baseline Area of Impairment: Attention;Problem solving;Following commands                   Current Attention Level: Selective Memory: Decreased short-term memory Following Commands: Follows one step commands consistently Safety/Judgement: Decreased awareness of deficits   Problem Solving: Slow processing;Difficulty sequencing;Requires verbal cues;Decreased initiation;Requires tactile cues General Comments: pt demonstrated anxiety with mobility initially and became more able to self regulate breathing with increased time sitting EOB; pt made a joke today and willing to participate       Exercises      General Comments General comments (skin integrity, edema, etc.): husband present during session      Pertinent Vitals/Pain Pain Assessment: Faces Faces Pain Scale: Hurts little more Pain Location: back  Pain Descriptors / Indicators: Grimacing;Guarding Pain Intervention(s): Limited activity within patient's tolerance;Monitored during session;Repositioned    Home Living  Prior Function            PT Goals (current goals can now be found in the care plan section) Acute Rehab PT Goals Patient Stated Goal: get better and go home Progress towards PT goals: Progressing toward goals    Frequency    Min 2X/week      PT Plan Current plan remains appropriate     Co-evaluation              AM-PAC PT "6 Clicks" Daily Activity  Outcome Measure  Difficulty turning over in bed (including adjusting bedclothes, sheets and blankets)?: Unable Difficulty moving from lying on back to sitting on the side of the bed? : Unable Difficulty sitting down on and standing up from a chair with arms (e.g., wheelchair, bedside commode, etc,.)?: Unable Help needed moving to and from a bed to chair (including a wheelchair)?: A Lot Help needed walking in hospital room?: Total Help needed climbing 3-5 steps with a railing? : Total 6 Click Score: 7    End of Session Equipment Utilized During Treatment: Gait belt;Oxygen Activity Tolerance: Patient tolerated treatment well Patient left: in bed;with call bell/phone within reach;with family/visitor present Nurse Communication: Mobility status PT Visit Diagnosis: Muscle weakness (generalized) (M62.81);Other abnormalities of gait and mobility (R26.89);Unsteadiness on feet (R26.81);Adult, failure to thrive (R62.7)     Time: 1533-1600 PT Time Calculation (min) (ACUTE ONLY): 27 min  Charges:  $Therapeutic Activity: 8-22 mins                    G Codes:       Earney Navy, PTA Pager: [phone]     Darliss Cheney 09/30/2016, 5:17 PM

## 2016-09-30 NOTE — Progress Notes (Signed)
Patient slept during the night. VS stable. Husband all night at bedside. No acute events overnight.   Will continue to monitor.  Alferd Obryant, RN

## 2016-09-30 NOTE — Progress Notes (Signed)
Occupational Therapy Treatment Patient Details Name: Haley Graham MRN: [id number] DOB: [birth year] Today's Date: 09/30/2016    History of present illness This 61 y.o. female admitted with sepsis, hypotension, dehydration, amemia, COPD, AKI due to poor oral intake and hypotension/dehydration.  PMH  includes:  Recent prolonged hospitalization for [place] type B aortic dissection s/p descending thoracic aortic stent graft placement; anxiety, GERD, COPD,    OT comments  Pt seen with PT.  Pt anxious, but willing to work with OT/PT.  She smiled again today, and made a joke.  She initially was very fearful and anxious with onset of dyspnea upon sitting EOB, but was able to calm herself with deep breathing strategies and was able to continue to implement this strategy throughout the session.  She sat EOB  10 mins, and moved sit to stand with mod - max A +2, she side stepped up side of bed with max A +2.  She states she thinks she is getting a little bit better, and states she needs to get stronger so she can go home and see her grandson!  Will continue to follow.   Follow Up Recommendations  SNF;Other (comment)    Equipment Recommendations  None recommended by OT    Recommendations for Other Services      Precautions / Restrictions Precautions Precautions: Fall       Mobility Bed Mobility Overal bed mobility: Needs Assistance Bed Mobility: Supine to Sit;Sit to Supine     Supine to sit: Mod assist Sit to supine: +2 for physical assistance;Mod assist   General bed mobility comments: max cues for sequencing and technique; assist to mobilize bilat LE and elevate trunk into sitting with use of chuck pad for positioning EOB; pt with increased anxiety and RR upon sitting EOB but able to regulate breathing with and redirection; +2 to return to supine  Transfers Overall transfer level: Needs assistance Equipment used: 1 person hand held assist;2 person hand held assist Transfers: Sit to/from  Stand Sit to Stand: Max assist;+2 physical assistance         General transfer comment: pt stood X3 from EOB; first trial with +1 but pt able to maintain standing for brief period; next 2 trials +2 face to face with gait belt and able to take sidesteps toward [facility]     Balance Overall balance assessment: Needs assistance Sitting-balance support: Bilateral upper extremity supported;Feet supported Sitting balance-Leahy Scale: Fair     Standing balance support: Bilateral upper extremity supported Standing balance-Leahy Scale: Poor                             ADL either performed or assessed with clinical judgement   ADL Overall ADL's : Needs assistance/impaired                                             Vision       Perception     Praxis      Cognition Arousal/Alertness: Awake/alert Behavior During Therapy: Flat affect;WFL for tasks assessed/performed Overall Cognitive Status: Impaired/Different from baseline Area of Impairment: Attention;Problem solving;Following commands                   Current Attention Level: Selective Memory: Decreased short-term memory Following Commands: Follows one step commands consistently Safety/Judgement: Decreased awareness of deficits  Problem Solving: Slow processing;Difficulty sequencing;Requires verbal cues;Decreased initiation;Requires tactile cues General Comments: pt demonstrated anxiety with mobility initially and became more able to self regulate breathing with increased time sitting EOB; pt made a joke today and willing to participate         Exercises     Shoulder Instructions       General Comments husband present during session    Pertinent Vitals/ Pain       Pain Assessment: Faces Faces Pain Scale: Hurts little more Pain Location: back  Pain Descriptors / Indicators: Grimacing;Guarding Pain Intervention(s): Limited activity within patient's tolerance;Monitored during  session  Home Living                                          Prior Functioning/Environment              Frequency  Min 2X/week        Progress Toward Goals  OT Goals(current goals can now be found in the care plan section)  Progress towards OT goals: Progressing toward goals  Acute Rehab OT Goals Patient Stated Goal: get better and go home  Plan Discharge plan remains appropriate    Co-evaluation    PT/OT/SLP Co-Evaluation/Treatment: Yes Reason for Co-Treatment: Complexity of the patient's impairments (multi-system involvement);To address functional/ADL transfers;For patient/therapist safety   OT goals addressed during session: ADL's and self-care      AM-PAC PT "6 Clicks" Daily Activity     Outcome Measure   Help from another person eating meals?: A Lot Help from another person taking care of personal grooming?: A Lot Help from another person toileting, which includes using toliet, bedpan, or urinal?: A Lot Help from another person bathing (including washing, rinsing, drying)?: Total Help from another person to put on and taking off regular upper body clothing?: Total Help from another person to put on and taking off regular lower body clothing?: Total 6 Click Score: 9    End of Session Equipment Utilized During Treatment: Gait belt  OT Visit Diagnosis: Unsteadiness on feet (R26.81);Muscle weakness (generalized) (M62.81) Pain - part of body:  (back )   Activity Tolerance Patient limited by fatigue   Patient Left in bed;with call bell/phone within reach;with family/visitor present   Nurse Communication Mobility status        Time: 1535-1600 OT Time Calculation (min): 25 min  Charges: OT General Charges $OT Visit: 1 Visit OT Treatments $Therapeutic Activity: 8-22 mins  [facility], OTR/L [phone]    Lucille Passy M 09/30/2016, 5:23 PM

## 2016-09-30 NOTE — Progress Notes (Signed)
Patient ID: Haley Graham, female   DOB: 16-Apr-1955, 61 y.o.   MRN: [id number]  This NP visited patient at the bedside as a follow up for palliative needs and emotional support..  Husband and daughter at bedside.  Continued conversation regarding current medical situation.  Family verbalizes thier concern and surprise regarding recent echocardiogram demonstrating drastically chargewd EF %.  Anxiously await cardiology consult and .  We discussed patient's improvement over the last 24 hours as it relates to mood, activity, and participation in plan of care.    Although patient has increased her oral intake over the past 24/hr it still only consists of the sips of soda and milkshakes. Discussed impacted nutrition on over all wellness and healing.  Discussed with Dr. Carolin Sicks and dietitian; will proceed to restart tube feeds per dietary recommendation for formula, at a low trickle dose of only 10 mL an hour, evaluate closely for tolerance.   Patient is encouraged to participate in physical therapy and self active range of motion exercises.    Discussed in detail importance of mobility, cough and deep breathing, to prevent overall risk of infection. Reinforced again with the patient her role in her own well-being.   Will f/u in the morning.  Questions and concerns addressed  Discussed case with Dr Carolin Sicks, Dr Sallyanne Kuster, and Dr Louretta Shorten  Time in  0930      Time out  1005  Time spent on the unit was 35 minutes   Greater than 50% of the time was spent in counseling and coordination of care  Wadie Lessen NP  Palliative Medicine Team Team Phone # [phone] Pager [phone]

## 2016-09-30 NOTE — Progress Notes (Addendum)
PROGRESS NOTE    Haley Graham  IOX:[id number] DOB: [birth year] DOA: 09/07/2016 PCP: Glenda Chroman, MD   Brief Narrative: 61 y.o.femalewith medical history significant of [place] type B aortic dissection status post descending thoracic aortic stent graft placement per vascular surgery during last hospitalization from 06/20/2016-08/22/2016, a history of diabetes mellitus with gastroparesis, gastroesophageal reflux disease, hypertension, IBS, abdominal aortic aneurysm, anxiety, depression presented with generalized weakness and sepsis on admission. The blood culture grwing is to therefore started on fluconazole for candidemia. Evaluated by ID. ID recommended continuing fluconazole 400 mg daily through 9/26. Patient now with congestive heart failure, hypoxia and echo showed reduced EF of 30-35% with akinesis of of mid-apicalanteroseptal and anterior myocardium.  Assessment & Plan:  # Sepsis due to candidemia: -Continue fluconazole until September 26 and needs follow-up with ophthalmologist in a month as per infectious disease.  #Acute systolic congestive heart failure: Started on IV Lasix 40 mg twice a day. Patient with lower extremity edema and crackles bilaterally. Chest x-ray with a small pleural effusion. Cardiology consult requested. Monitor BMP, urine output. May need further cardiac eval ? Cath defer to cardiology team.  #Prolonged QTC: Discontinue Azithromycin. Monitor in telemetry.  #Severe protein calorie malnutrition: Patient has NG tube for feeding. Has nausea. Recommended to continue oral supplement. If patient takes orally well may not need NG tube feeding. Discussed with the nursing staff and the palliative care team. Dietary consult. On marinol.  #Acute kidney injury resolved  #Acute on Chronic anemia likely due to chronic disease: Patient has good iron stores. A repeat CBC in the morning. May need blood transfusion.  #Thrombocytopenia likely due to acute illness: No sign  of bleeding. Monitor CBC.  #Anxiety depression: Evaluated by psychiatrist. -Psychiatry changed Xanax 0.25 mg BID and discontinued Celexa; Patient was started on Cymbalta Daily for Depression.  Vyvanse started  # hypokalemia: start KCL oral. Monitor labs  DVT prophylaxis: DC heparin and place on SCD. Patient with anemia and thrombocytopenia Code Status: Full code Family Communication: Discussed with the patient's husband and daughter at bedside Disposition Plan: Currently admitted, likely discharge to skilled facility in 3-4 days    Consultants:   Infectious disease  Psychiatrist  Palliative care  Cardiologist  Procedures:  Thoracentesis 8/17  Echo 8/28 EF 30-35%, akinesis of mid-apicalanteroseptal and anterior myocardium.  Diastolic dysfunction.  Echo 8/13 EF 26-83, diastolic dysfunction, lipomatous hypertrophy Antimicrobials: Fluconazole Subjective: Seen and examined at bedside. Patient reported mild shortness of breath, weakness. No chest pain, dizziness, headache. Daughter and husband at bedside.  Objective: Vitals:   09/30/16 0353 09/30/16 0630 09/30/16 0634 09/30/16 1220  BP: (!) 98/39  (!) 108/34 (!) 105/43  Pulse: 93   96  Resp: 18   18  Temp: 98 F (36.7 C)   98 F (36.7 C)  TempSrc: Oral   Oral  SpO2: 97% 100%  100%  Weight: 70.1 kg (154 lb 9.6 oz)     Height:        Intake/Output Summary (Last 24 hours) at 09/30/16 1443 Last data filed at 09/30/16 1012  Gross per 24 hour  Intake              480 ml  Output              360 ml  Net              120 ml   Filed Weights   09/28/16 0558 09/29/16 0535 09/30/16 0353  Weight: 71.8 kg (158 lb  3.2 oz) 69.5 kg (153 lb 4.8 oz) 70.1 kg (154 lb 9.6 oz)    Examination:  General exam: Ill-looking female lying on bed Respiratory system: Bibasal decreased breath sound with crackles, respiratory effort normal Cardiovascular system: S1 & S2 heard, RRR.  Bilateral lower extremities edema. Gastrointestinal  system: Abdomen is  soft and nontender. Normal bowel sounds heard. Central nervous system: Alert and oriented. No focal neurological deficits. Extremities: Symmetric 5 x 5 power. Skin: No rashes, lesions or ulcers Psychiatry: Judgement and insight appear normal. Mood & affect appropriate.     Data Reviewed: I have personally reviewed following labs and imaging studies  CBC:  Recent Labs Lab 09/26/16 0451 09/27/16 1151 09/28/16 0731 09/29/16 0607 09/30/16 0911  WBC 6.1 7.1 9.5 8.1 6.6  HGB 7.3* 8.3* 8.2* 7.8* 7.4*  HCT 22.8* 24.9* 26.2* 25.0* 23.4*  MCV 99.6 102.0* 100.8* 102.9* 100.9*  PLT 83* 98* 100* 95* 94*   Basic Metabolic Panel:  Recent Labs Lab 09/26/16 0451 09/27/16 0610 09/28/16 0731 09/29/16 0607 09/30/16 0911  NA 136 138 140 139 137  K 3.2* 3.5 4.1 3.6 3.4*  CL 105 107 108 107 105  CO2 24 24 23 25 25   GLUCOSE 125* 133* 138* 127* 95  BUN 15 12 15 15 15   CREATININE 0.96 0.87 0.89 0.86 0.89  CALCIUM 7.2* 7.9* 7.6* 6.9* 7.1*  MG 1.9 1.6* 1.7 1.5* 1.8  PHOS 3.7 3.3 4.0 3.8 3.2   GFR: Estimated Creatinine Clearance: 63.1 mL/min (by C-G formula based on SCr of 0.89 mg/dL). Liver Function Tests:  Recent Labs Lab 09/26/16 0451 09/27/16 0610 09/28/16 0731 09/29/16 0607 09/30/16 0911  AST 17 21 25 23 20   ALT 11* 11* 13* 13* 14  ALKPHOS 329* 488* 756* 660* 489*  BILITOT 0.7 1.0 1.2 1.0 0.9  PROT 4.4* 5.2* 4.9* 4.5* 4.5*  ALBUMIN 1.7* 2.1* 1.9* 1.7* 1.5*   No results for input(s): LIPASE, AMYLASE in the last 168 hours. No results for input(s): AMMONIA in the last 168 hours. Coagulation Profile: No results for input(s): INR, PROTIME in the last 168 hours. Cardiac Enzymes: No results for input(s): CKTOTAL, CKMB, CKMBINDEX, TROPONINI in the last 168 hours. BNP (last 3 results) No results for input(s): PROBNP in the last 8760 hours. HbA1C: No results for input(s): HGBA1C in the last 72 hours. CBG:  Recent Labs Lab 09/29/16 1116 09/29/16 1643  09/29/16 2056 09/30/16 0805 09/30/16 1132  GLUCAP 137* 151* 125* 84 124*   Lipid Profile: No results for input(s): CHOL, HDL, LDLCALC, TRIG, CHOLHDL, LDLDIRECT in the last 72 hours. Thyroid Function Tests:  Recent Labs  09/28/16 2217  TSH 0.713   Anemia Panel: No results for input(s): VITAMINB12, FOLATE, FERRITIN, TIBC, IRON, RETICCTPCT in the last 72 hours. Sepsis Labs: No results for input(s): PROCALCITON, LATICACIDVEN in the last 168 hours.  Recent Results (from the past 240 hour(s))  Culture, blood (Routine X 2) w Reflex to ID Panel     Status: None   Collection Time: 09/20/16  3:01 PM  Result Value Ref Range Status   Specimen Description BLOOD BLOOD RIGHT HAND  Final   Special Requests IN PEDIATRIC BOTTLE Blood Culture adequate volume  Final   Culture NO GROWTH 5 DAYS  Final   Report Status 09/25/2016 FINAL  Final         Radiology Studies: Dg Chest Port 1 View  Result Date: 09/30/2016 CLINICAL DATA:  Shortness of breath. EXAM: PORTABLE CHEST 1 VIEW COMPARISON:  Radiographs 09/26/2016.  CT 09/27/2016. FINDINGS: 1033 hours. Feeding tube remains in place, tip not visualized. The heart size and mediastinal contours are stable status post thoracic aortic endograft repair. There is a persistent left pleural effusion and mild bibasilar atelectasis, although the overall pulmonary aeration has improved. There is no edema or pneumothorax. The bones appear unchanged. IMPRESSION: Overall improved pulmonary aeration with persistent bibasilar atelectasis and small left pleural effusion. No new findings. Electronically Signed   By: Richardean Sale M.D.   On: 09/30/2016 10:51        Scheduled Meds: . ALPRAZolam  0.25 mg Oral QHS  . aspirin EC  81 mg Oral Daily  . dronabinol  5 mg Oral BID AC  . DULoxetine  60 mg Oral Daily  . feeding supplement (ENSURE ENLIVE)  237 mL Oral TID BM  . feeding supplement (PRO-STAT SUGAR FREE 64)  30 mL Oral TID WC  . feeding supplement (VITAL  1.5 CAL)  1,000 mL Per Tube Q24H  . fluconazole  400 mg Oral Daily  . furosemide  40 mg Intravenous Q12H  . heparin  5,000 Units Subcutaneous Q8H  . insulin aspart  0-5 Units Subcutaneous QHS  . insulin aspart  0-9 Units Subcutaneous TID WC  . lidocaine  1 application Urethral Once  . lisdexamfetamine  20 mg Oral Daily  . metoprolol tartrate  12.5 mg Oral BID  . pantoprazole  40 mg Oral QHS  . sodium chloride flush  3 mL Intravenous Q12H   Continuous Infusions:   LOS: 21 days    Donald Memoli Tanna Furry, MD [facility] Pager [phone]  If 7PM-7AM, please contact night-coverage [email or web address] Password [facility] 09/30/2016, 2:43 PM

## 2016-09-30 NOTE — Consult Note (Addendum)
Cardiology Consultation:   Patient ID: Haley Graham; [id number]; Sep 07, 1955   Admit date: 09/13/2016 Date of Consult: 09/30/2016  Primary Care Provider: Glenda Chroman, MD Primary Cardiologist: New to Dr. Sallyanne Kuster    Patient Profile:   Haley Graham is a 61 y.o. female with a hx  descending thoracic aortic stent graft placement of stanford type B aortic dissection, DM, HTN, IBS, gastroparesis  and GERD for who is being seen today for the evaluation of acute CHF at the request of Dr. Carolin Sicks.   Recently admitted 5/19-7/21 Stanford type B aortic dissection status post descending thoracic aortic stent graft placement 6/12. 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 Refused LTAC and then discharged to home on 7/21.  History of Present Illness:   Ms. Leiter presented to ER 09/03/2016 with generalized weakness and poor po intake. Admitted for sepsis with concern for Enteritis. Started on Abx. Patient is s/p thoracentesis on 8/17 due to bilateral pleural effusion. Treated with fluconazole for a yeast UTI however later blood culture grown yeast on 8/18. Seen by ID and plan to continue fluconazole 400 mg daily through 9/26. Recommended eye exam. She also transfused 1 unit of pRBC. She was diuresed initially and later held due to intravascular fluid depletion. Seen by Nephrology. 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 Recommend  Conservative management with oral loop diuretics and attention to nutrition. Given IV albumin.   CTA on 8/26 showed persistent bilateral pleural effusions and atelectasis vs pneumonia as well as concern for mild edema (stable aortic graft stent). Received 1L of NS for tachycardia on 8/26. BNP was elevated to 1093 on 8/27  (it was 188 on 09/12/16) and started on IV  lasix 40mg  BID. Scr remained stable. Alkaline phosphatase peaked to 756 on 8/27--> now trending down.   Echo on 09/14/16 showed LVEF of 65-70% with grade 1 DD. Increased thickness of the septum, consistent with lipomatous hypertrophy.  Repeat echo yesterday showed LVEF reduced to 30-35% with akinesis of the mid-apicalanteroseptal and anterior myocardium and grade 1 DD.   EKG 09/30/16: SR with TWI in anterior lateral leads, QT/QTc 438/556 ms - personally reviwed EKG 09/29/16: SR at rate of 98 bpm with TWI in anterior lateral leads, QT/QTc 422/538 ms - personally reviewed EKG 09/28/16: SR at rate of 121 bpm with non specific T wave flattening - personally reviewed  Telemetry: sinus rhythm with intermittent tachycardia - personally reviewed  Hgb trending down 8.3 on 8/26 today 7.4.   The patient has seen by multiple speciality.  Patient was seen by [facility]VS and felt no evidence of bleeding or expansion of aorta since stenting.  Right iliac filling defect probably small area of dissection from procedure. No surgical intervention recommended.  Seen by Palliative care --> refused to participate in discussion of code status. Remained FULL CODE.  Consulted Psychiatry Dr. Louretta Shorten and managing care.   Patient currently complains of SOB on Iv lasix. Has orthopnea that worsen with laying down.   Past Medical History:  Diagnosis Date  . Anxiety   . Arthritis   . Diabetes mellitus without complication ([place])   . Gastroparesis   . GERD (gastroesophageal reflux disease)   . Hypertension   . IBS (irritable bowel syndrome)     Past Surgical History:  Procedure Laterality Date  . ABDOMINAL  HYSTERECTOMY    . AORTOGRAM N/A 07/24/2016   Procedure: [facility] AND ABDOMINAL AORTA  ANGIOGRAM;  Surgeon: Waynetta Sandy, MD;  Location: [place];  Service: Vascular;  Laterality: N/A;  . CESAREAN SECTION    . CHOLECYSTECTOMY    . COLONOSCOPY    . COLONOSCOPY N/A 06/10/2016   Procedure: COLONOSCOPY;  Surgeon:  Rogene Houston, MD;  Location: [facility];  Service: Endoscopy;  Laterality: N/A;  8:30  . IR THORACENTESIS ASP PLEURAL SPACE W/IMG GUIDE  09/18/2016  . POLYPECTOMY  06/10/2016   Procedure: POLYPECTOMY;  Surgeon: Rogene Houston, MD;  Location: [facility];  Service: Endoscopy;;  multiple colon  . THORACIC AORTIC ENDOVASCULAR STENT GRAFT N/A 08/06/2016   Procedure: THORACIC AORTIC ENDOVASCULAR STENT GRAFT/Thorasic and Abdominal Angiogram, Entravascular ultrasound., Open femoral exposure,Left Brachial access, and patch angioplasty right femoral artery.;  Surgeon: Serafina Mitchell, MD;  Location: [facility];  Service: Vascular;  Laterality: N/A;  . UPPER GASTROINTESTINAL ENDOSCOPY       Inpatient Medications: Scheduled Meds: . ALPRAZolam  0.25 mg Oral QHS  . ARIPiprazole  5 mg Oral Daily  . aspirin EC  81 mg Oral Daily  . dronabinol  5 mg Oral BID AC  . DULoxetine  60 mg Oral Daily  . feeding supplement (ENSURE ENLIVE)  237 mL Oral TID BM  . feeding supplement (PRO-STAT SUGAR FREE 64)  30 mL Per Tube Daily  . feeding supplement (VITAL 1.5 CAL)  1,000 mL Per Tube Q24H  . fluconazole  400 mg Oral Daily  . furosemide  40 mg Intravenous Q12H  . heparin  5,000 Units Subcutaneous Q8H  . insulin aspart  0-5 Units Subcutaneous QHS  . insulin aspart  0-9 Units Subcutaneous TID WC  . lidocaine  1 application Urethral Once  . lisdexamfetamine  20 mg Oral Daily  . metoprolol tartrate  12.5 mg Oral BID  . pantoprazole  40 mg Oral QHS  . sodium chloride flush  3 mL Intravenous Q12H   Continuous Infusions:  PRN Meds: acetaminophen **OR** acetaminophen, alum & mag hydroxide-simeth, fluticasone, ipratropium-albuterol, lidocaine, loperamide, morphine injection, ondansetron, senna-docusate, sodium phosphate, sorbitol, traMADol  Allergies:    Allergies  Allergen Reactions  . Penicillins Rash and Other (See Comments)    PATIENT HAS HAD A PCN REACTION WITH IMMEDIATE RASH, FACIAL/TONGUE/THROAT SWELLING,  SOB, OR LIGHTHEADEDNESS WITH HYPOTENSION:  #  #  #  YES  #  #  #   Has patient had a PCN reaction causing severe rash involving mucus membranes or skin necrosis: no Has patient had a PCN reaction that required hospitalization no Has patient had a PCN reaction occurring within the last 10 years:no If all of the above answers are "NO", then may proceed with Cephalosporin use.  . Ativan [Lorazepam] Other (See Comments)    Makes pt very confused and more agitated, irritable.  . Codeine Nausea And Vomiting  . Reglan [Metoclopramide] Other (See Comments)    TACHYCARDIA    Social History:   Social History   Social History  . Marital status: Married    Spouse name: N/A  . Number of children: N/A  . Years of education: N/A   Occupational History  . Not on file.   Social History Main Topics  . Smoking status: Current Every Day Smoker    Packs/day: 0.75    Years: 23.00    Types: Cigarettes  . Smokeless tobacco: Never Used     Comment: 5-6 cigarettes. Cut back  about 3 weeks ago. Smoking since age 50.  Marland Kitchen Alcohol use No  . Drug use: No  . Sexual activity: Not on file   Other Topics Concern  . Not on file   Social History Narrative  . No narrative on file    Family History:    Family History  Problem Relation Age of Onset  . Cancer Mother   . Stroke Mother   . Heart attack Mother   . Stroke Father      ROS:  Please see the history of present illness.  ROS All other ROS reviewed and negative.     Physical Exam/Data:   Vitals:   09/30/16 0353 09/30/16 0630 09/30/16 0634 09/30/16 1220  BP: (!) 98/39  (!) 108/34 (!) 105/43  Pulse: 93   96  Resp: 18   18  Temp: 98 F (36.7 C)   98 F (36.7 C)  TempSrc: Oral   Oral  SpO2: 97% 100%  100%  Weight: 154 lb 9.6 oz (70.1 kg)     Height:        Intake/Output Summary (Last 24 hours) at 09/30/16 1222 Last data filed at 09/30/16 1012  Gross per 24 hour  Intake              480 ml  Output              360 ml  Net               120 ml   Filed Weights   09/28/16 0558 09/29/16 0535 09/30/16 0353  Weight: 158 lb 3.2 oz (71.8 kg) 153 lb 4.8 oz (69.5 kg) 154 lb 9.6 oz (70.1 kg)   Body mass index is 27.39 kg/m.  General:  Chronically ill appearing female on NAD HEENT: normal Lymph: no adenopathy Neck: no JVD Endocrine:  No thryomegaly Vascular: No carotid bruits; FA pulses 2+ bilaterally without bruits  Cardiac:  normal S1, S2; RRR; no murmu Lungs:  Bibasilar crackles noted  Abd: diffuse tender has NG tube Ext: trace left lower extremity edema noted  Musculoskeletal:  No deformities, BUE and BLE strength normal and equal Skin: warm and dry  Neuro:  CNs 2-12 intact, no focal abnormalities noted Psych:  Normal affect    Relevant CV Studies: As described above  Laboratory Data:  Chemistry Recent Labs Lab 09/28/16 0731 09/29/16 0607 09/30/16 0911  NA 140 139 137  K 4.1 3.6 3.4*  CL 108 107 105  CO2 23 25 25   GLUCOSE 138* 127* 95  BUN 15 15 15   CREATININE 0.89 0.86 0.89  CALCIUM 7.6* 6.9* 7.1*  GFRNONAA >60 >60 >60  GFRAA >60 >60 >60  ANIONGAP 9 7 7      Recent Labs Lab 09/28/16 0731 09/29/16 0607 09/30/16 0911  PROT 4.9* 4.5* 4.5*  ALBUMIN 1.9* 1.7* 1.5*  AST 25 23 20   ALT 13* 13* 14  ALKPHOS 756* 660* 489*  BILITOT 1.2 1.0 0.9   Hematology Recent Labs Lab 09/28/16 0731 09/29/16 0607 09/30/16 0911  WBC 9.5 8.1 6.6  RBC 2.60* 2.43* 2.32*  HGB 8.2* 7.8* 7.4*  HCT 26.2* 25.0* 23.4*  MCV 100.8* 102.9* 100.9*  MCH 31.5 32.1 31.9  MCHC 31.3 31.2 31.6  RDW 24.8* 24.8* 23.5*  PLT 100* 95* 94*   Cardiac EnzymesNo results for input(s): TROPONINI in the last 168 hours. No results for input(s): TROPIPOC in the last 168 hours.  BNP Recent Labs Lab 09/28/16 0731  BNP 1,093.3*  DDimer No results for input(s): DDIMER in the last 168 hours.  Radiology/Studies:  Ct Angio Chest Pe W Or Wo Contrast  Result Date: 09/27/2016 CLINICAL DATA:  Tachycardia, history of recent  thoracentesis EXAM: CT ANGIOGRAPHY CHEST WITH CONTRAST TECHNIQUE: Multidetector CT imaging of the chest was performed using the standard protocol during bolus administration of intravenous contrast. Multiplanar CT image reconstructions and MIPs were obtained to evaluate the vascular anatomy. CONTRAST:  100 mL Isovue 370 intravenous COMPARISON:  09/17/2016, 09/26/2016, 08/05/2016 FINDINGS: Cardiovascular: Limited evaluation of distal branch vessels in the bilateral lower lobes, secondary to consolidations. No definite central embolus is visualized. Re- demonstrated aortic stent graft in the thoracic arch and terminating in the descending thoracic aorta for type B dissection. Faintly visualized dissection flap in the distal descending thoracic aorta. Stable heart size. No large pericardial effusion. Scattered atherosclerotic calcifications within the aorta Mediastinum/Nodes: No thyroid mass. Midline trachea. Esophageal tube partially visualized in the stomach. No significantly enlarged mediastinal lymph nodes. Lungs/Pleura: Small moderate left pleural effusion, similar compared to prior. Small right-sided pleural effusion, stable to slight decrease. Patchy consolidations within the lower lobes, atelectasis or pneumonia. Negative for pneumothorax. Scattered hazy densities suggestive of edema. Upper Abdomen: Enlarged stomach.  No acute abnormality. Musculoskeletal: No chest wall abnormality. No acute or significant osseous findings. Review of the MIP images confirms the above findings. IMPRESSION: 1. Limited assessment of distal branch vessels in the bilateral lower lobes secondary to consolidations. No definite acute central embolus is visualized. 2. Grossly stable aortic stent graft for type B dissection with faintly visualized dissection flap in the distal descending thoracic aorta 3. Slight decrease in size of right pleural effusion. Small moderate left pleural effusions similar compared to prior. Negative for  pneumothorax. Patchy left greater than right lower lobe consolidation may reflect atelectasis or pneumonia. Development of hazy ground-glass densities within the bilateral lungs suspicious for mild edema 4. Enlarged stomach with partially visualized esophageal tube. Aortic Atherosclerosis (ICD10-I70.0). Electronically Signed   By: Donavan Foil M.D.   On: 09/27/2016 20:56   Dg Chest Port 1 View  Result Date: 09/30/2016 CLINICAL DATA:  Shortness of breath. EXAM: PORTABLE CHEST 1 VIEW COMPARISON:  Radiographs 09/26/2016.  CT 09/27/2016. FINDINGS: 1033 hours. Feeding tube remains in place, tip not visualized. The heart size and mediastinal contours are stable status post thoracic aortic endograft repair. There is a persistent left pleural effusion and mild bibasilar atelectasis, although the overall pulmonary aeration has improved. There is no edema or pneumothorax. The bones appear unchanged. IMPRESSION: Overall improved pulmonary aeration with persistent bibasilar atelectasis and small left pleural effusion. No new findings. Electronically Signed   By: Richardean Sale M.D.   On: 09/30/2016 10:51   Dg Chest Port 1 View  Result Date: 09/26/2016 CLINICAL DATA:  Lung crackles EXAM: PORTABLE CHEST 1 VIEW COMPARISON:  09/19/2016 FINDINGS: Prior aortic stent graft in the descending thoracic aorta. Heart is normal size. Bibasilar airspace opacities, left greater than right, worsening on the left since prior study. This could represent atelectasis although left lower lobe pneumonia cannot be excluded. Possible small left effusion. No acute bony abnormality. IMPRESSION: Bibasilar atelectasis or infiltrates, left greater than right. Possible small left effusion. Electronically Signed   By: Rolm Baptise M.D.   On: 09/26/2016 17:17    Assessment and Plan:   1. Acute systolic CHF - BNP was elevated to 1093 on 8/27  (it was 188 on 09/12/16). She received bolus of fluid on 8/26. Echo yesterday showed LVEF reduced  to  30-35% (was 65-70% on 8/13) with akinesis of the mid-apicalanteroseptal and anterior myocardium and grade 1 DD.  - Started on IV lasix 40mg  BID daily for the past 2 days and now on BID dose. Scr stable (initially worsen 8/12 --> requiring nephrology consult). EKG has new TWI anterior laterally. Will  plan for cath on Friday.  - continue BB. Consider adding ACE/ARB when stable BP.   2. Anemia  - Transfused 1 unit of pRBC on 8/17. Hgb trending down in past 3 days. Today 7.4. Per primary team.   3. QT prolongation - multiple medication affecting QT. Discontinued Erythromycin.   4. Stanford type B aortic dissection status post descending thoracic aortic stent graft placement 6/12 - Last CTA showed stable aortic stent graft  5. HTN - BP stable on low dose BB.   Jarrett Soho, PA  09/30/2016 12:22 PM   I have seen and examined the patient along with Bhagat,Bhavinkumar, PA .  I have reviewed the chart, notes and new data.  I agree with PA's note.  Key new complaints: dyspnea with minimal activity, some orthopnea Key examination changes: no rales, hard to see JVP Key new findings / data: anemia without evidence of Fe deficiency, marked and worsening long QT with anterior broad inverted T waves. Marked elevation in BNP. Normal renal function. CXR shows improving pulmonary edema.  Reviewed the chest CTA. The thoracic aortic stent extends to the very ostium of the innominate artery. CT also notable for virtual absence of coronary calcium.  PLAN: Acute pulmonary edema/systolic heart failure due to new regional LV dysfunction. Differential diagnosis is LAD artery stenosis and ischemia with LV dysfunction versus takotsubo sd.  Most straightforward way to distinguish would be cardiac cath, but I am concerned about the possibilit of aortic stent disruption. Right radial is the only reasonable approach with "jailed" left subclavian and distal type B dissection/false lumen. Will discuss w  VVS. Coronary CT angio may be a feasible alternative once we slow down her tachypnea and heart rate by treating CHF. Most conservative approach would be repeating echo in a week. If EF/wall motion normalizes, then we can exclude CAD with a nuclear perfusion study (Lexiscan Myoview). QT prolongation may be explained by either ischemia or takotsubo sd. Avoid QT prolonging drugs. Abilify stopped after discussion w Dr. Octavio Graves.  Also some concern with stimulants such as Vyvanse , but this has had a major positive impact on her mood.  Sanda Klein, MD, [place] [phone] 09/30/2016, 2:44 PM

## 2016-09-30 NOTE — Consult Note (Signed)
[facility] Consult   Reason for Consult:  Severe depression Referring Physician:  Dr. Carolin Sicks Patient Identification: Haley Graham MRN:  [id number] Principal Diagnosis: Candidemia [facility]) Diagnosis:   Patient Active Problem List   Diagnosis Date Noted  . Depression [F32.9]   . Candidemia ([place]) [B37.7] 09/21/2016  . Hypophosphatemia [E83.39] 09/21/2016  . Hypomagnesemia [E83.42] 09/21/2016  . Palliative care by specialist [Z51.5]   . Adult failure to thrive [R62.7]   . Protein-calorie malnutrition, severe [E43] 09/12/2016  . Sepsis ([place]) [A41.9] 09/20/2016  . Hypotension [I95.9] 09/29/2016  . AKI (acute kidney injury) ([place]) [N17.9] 09/30/2016  . Dehydration [E86.0] 09/20/2016  . Generalized weakness [R53.1] 09/08/2016  . Anemia [D64.9] 09/05/2016  . Thrombocytopenia ([place]) [D69.6] 09/04/2016  . Abnormal computed tomography angiography (CTA) of abdomen and pelvis [R93.5] 09/02/2016  . Colitis: Probable [K52.9] 09/08/2016  . AAA (abdominal aortic aneurysm) ([place]) [I71.4]   . Abdominal pain [R10.9]   . Ischemic enteritis ([place]) [K55.9]   . Benign essential HTN [I10]   . Diabetes mellitus type 2 in obese (HCC) [E11.69, E66.9]   . Anxiety state [F41.1]   . Tachycardia [R00.0]   . Tachypnea [R06.82]   . Hyponatremia [E87.1]   . Leukocytosis [D72.829]   . Acute blood loss anemia [D62]   . Dissection of thoracoabdominal aorta ([place]) [I71.03]   . Unstable angina pectoris ([place]) [I20.0]   . SOB (shortness of breath) [R06.02]   . Aortic dissection distal to left subclavian ([place]) [I71.01] 06/20/2016  . Change in stool [R19.5] 05/28/2016  . Gastroparesis [K31.84] 11/19/2011  . Nausea [R11.0] 11/19/2011  . Dysphagia [R13.10] 11/11/2011  . GERD (gastroesophageal reflux disease) [K21.9] 11/11/2011    Total Time spent with patient: 1 hour  Subjective:   Haley Graham is a 61 y.o. female patient admitted with generalized weakness.  HPI:  Haley Graham is a 61  y.o. female, seen, chart reviewed for this face-to-face psychiatric consultation and evaluation of increased symptoms of depression. Patient reported her mom died 10-Mar-2016 with multiple medical problems. Patient has been depressed and anxious, has been receiving outpatient medication management from primary care physician who started Celexa and Xanax which seems to be helped initially but not working any longer. Patient worried that she is not getting regular Xanax while in the hospital which is making her jitters. Patient has been sad, isolated, withdrawn, generalized weakness disturbed appetite and sleep. Patient stated that she has clear mind and able to make her own decisions she does not want anybody else make decision for her. Patient reported few months ago she called her husband and requested to call the ambulance when she had problem with her aorta dissection. Patient denied suicidal/homicidal ideations, intention or plans. Patient has no evidence of psychosis. Patient daughter who is at bedside left the room because patient does not want her daughter to be at bedside while talking with the psychiatrist. Patient is also feels that her daughter is trying to take power of attorney to make medical decisions for her.  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  Past Psychiatric History: Patient has no acute psychiatric hospitalization but receiving outpatient medication management from primary care physician for depression and anxiety.  09/28/2016 Interval history: Patient seen today for psychiatric consultation follow-up  of severe depression. Patient has been receiving  Cymbalta 40 mg, Xanax 0.25 mg twice daily and Abilify 5 mg for depression at this time. Patient continued to be depressed, decreased psychomotor activity and unable to care for herself, she needs to feed and taken to her by staff members. Reportedly family has been supportive. Review of EKGs indicated she has a normal QT and QTC without any prolongation. Patient stated she is fine but has constricted affect. Patient is talks very limited and simple words only patient has poor eye contact. Patient has no suicidal ideation/homicidal ideation or evidence of psychosis. Patient was informed we will increase her Cymbalta from 40 mg to 60 mg may benefit from short-term small dose of stimulant medication Vyvanse 20 mg a day to improve the psychomotor activity. Patient benefit from psychosocial stimulation too.   09/30/2016  Interval history: Patient appeared lying in her bed with head end was elevated and continues oxygen supplement by cannula. Patient is awake, alert, oriented to time place person and able to recognize all family members. She has good eye contact throughout this visit. Patient was seen actively listening her cardiologist and asking appropriate questions during this visit. Patient family is very supportive including her husband and daughter who is on the phone talking with the cardiologist. Palliative care has been closely involved with this case and supportive to the patient and family. Review of recent EKGs indicated patient QT and QTC dose has been progressively increasing, cardiologist is believes because of congestive heart failure, low ejection fraction and pulmonary congestion. Reportedly patient was quite energetic and happy yesterday and even offered to get out of the bed and walk. According to PT patient struggled to breathe and has not enough strength to stand or walk longer. Patient husband stated patient has been sleeping with some disturbance. Patient reported today she has medical  conditions and fear of dying. Patient was increased to focus on one dated time to deal with her medical problems and not to focus on long-time feature at this time. Patient is willing to participate in treatment. We will discontinue patient Abilify due to elevated QT and QTC prolongation and and will continue Cymbalta, Xanax and Vyvanse which showed significant improvement in her mental status.  Risk to Self: Is patient at risk for suicide?: No Risk to Others:   Prior Inpatient Therapy:   Prior Outpatient Therapy:    Past Medical History:  Past Medical History:  Diagnosis Date  . Anxiety   . Arthritis   . Diabetes mellitus without complication ([place])   . Gastroparesis   . GERD (gastroesophageal reflux disease)   . Hypertension   . IBS (irritable bowel syndrome)     Past Surgical History:  Procedure Laterality Date  . ABDOMINAL HYSTERECTOMY    . AORTOGRAM N/A 07/24/2016   Procedure: [facility] AND ABDOMINAL AORTA  ANGIOGRAM;  Surgeon: Waynetta Sandy, MD;  Location: [place];  Service: Vascular;  Laterality: N/A;  . CESAREAN SECTION    . CHOLECYSTECTOMY    . COLONOSCOPY    . COLONOSCOPY N/A 06/10/2016   Procedure: COLONOSCOPY;  Surgeon: Rogene Houston, MD;  Location[facility];  Service: Endoscopy;  Laterality: N/A;  8:30  . IR THORACENTESIS ASP PLEURAL SPACE W/IMG GUIDE  09/18/2016  . POLYPECTOMY  06/10/2016   Procedure: POLYPECTOMY;  Surgeon: Rogene Houston, MD;  Location: [facility];  Service: Endoscopy;;  multiple colon  . THORACIC AORTIC ENDOVASCULAR STENT GRAFT N/A 08/06/2016   Procedure: THORACIC AORTIC ENDOVASCULAR  STENT GRAFT/Thorasic and Abdominal Angiogram, Entravascular ultrasound., Open femoral exposure,Left Brachial access, and patch angioplasty right femoral artery.;  Surgeon: Serafina Mitchell, MD;  Location: [facility];  Service: Vascular;  Laterality: N/A;  . UPPER GASTROINTESTINAL ENDOSCOPY     Family History:  Family History  Problem Relation Age of Onset  .  Cancer Mother   . Stroke Mother   . Heart attack Mother   . Stroke Father    Family Psychiatric  History: Unknown Social History:  History  Alcohol Use No     History  Drug Use No    Social History   Social History  . Marital status: Married    Spouse name: N/A  . Number of children: N/A  . Years of education: N/A   Social History Main Topics  . Smoking status: Current Every Day Smoker    Packs/day: 0.75    Years: 23.00    Types: Cigarettes  . Smokeless tobacco: Never Used     Comment: 5-6 cigarettes. Cut back about 3 weeks ago. Smoking since age 47.  Marland Kitchen Alcohol use No  . Drug use: No  . Sexual activity: Not Asked   Other Topics Concern  . None   Social History Narrative  . None   Additional Social History:    Allergies:   Allergies  Allergen Reactions  . Penicillins Rash and Other (See Comments)    PATIENT HAS HAD A PCN REACTION WITH IMMEDIATE RASH, FACIAL/TONGUE/THROAT SWELLING, SOB, OR LIGHTHEADEDNESS WITH HYPOTENSION:  #  #  #  YES  #  #  #   Has patient had a PCN reaction causing severe rash involving mucus membranes or skin necrosis: no Has patient had a PCN reaction that required hospitalization no Has patient had a PCN reaction occurring within the last 10 years:no If all of the above answers are "NO", then may proceed with Cephalosporin use.  . Ativan [Lorazepam] Other (See Comments)    Makes pt very confused and more agitated, irritable.  . Codeine Nausea And Vomiting  . Reglan [Metoclopramide] Other (See Comments)    TACHYCARDIA    Labs:  Results for orders placed or performed during the hospital encounter of 09/04/2016 (from the past 48 hour(s))  Glucose, capillary     Status: Abnormal   Collection Time: 09/28/16  4:19 PM  Result Value Ref Range   Glucose-Capillary 130 (H) 65 - 99 mg/dL   Comment 1 Notify RN   Glucose, capillary     Status: Abnormal   Collection Time: 09/28/16 10:02 PM  Result Value Ref Range   Glucose-Capillary 127 (H) 65 -  99 mg/dL   Comment 1 Notify RN    Comment 2 Document in Chart   TSH     Status: None   Collection Time: 09/28/16 10:17 PM  Result Value Ref Range   TSH 0.713 0.350 - 4.500 uIU/mL    Comment: Performed by a 3rd Generation assay with a functional sensitivity of <=0.01 uIU/mL.  Magnesium     Status: Abnormal   Collection Time: 09/29/16  6:07 AM  Result Value Ref Range   Magnesium 1.5 (L) 1.7 - 2.4 mg/dL  Phosphorus     Status: None   Collection Time: 09/29/16  6:07 AM  Result Value Ref Range   Phosphorus 3.8 2.5 - 4.6 mg/dL  Comprehensive metabolic panel     Status: Abnormal   Collection Time: 09/29/16  6:07 AM  Result Value Ref Range   Sodium 139 135 -  145 mmol/L   Potassium 3.6 3.5 - 5.1 mmol/L   Chloride 107 101 - 111 mmol/L   CO2 25 22 - 32 mmol/L   Glucose, Bld 127 (H) 65 - 99 mg/dL   BUN 15 6 - 20 mg/dL   Creatinine, Ser 0.86 0.44 - 1.00 mg/dL   Calcium 6.9 (L) 8.9 - 10.3 mg/dL   Total Protein 4.5 (L) 6.5 - 8.1 g/dL   Albumin 1.7 (L) 3.5 - 5.0 g/dL   AST 23 15 - 41 U/L   ALT 13 (L) 14 - 54 U/L   Alkaline Phosphatase 660 (H) 38 - 126 U/L   Total Bilirubin 1.0 0.3 - 1.2 mg/dL   GFR calc non Af Amer >60 >60 mL/min   GFR calc Af Amer >60 >60 mL/min    Comment: (NOTE) The eGFR has been calculated using the CKD EPI equation. This calculation has not been validated in all clinical situations. eGFR's persistently <60 mL/min signify possible Chronic Kidney Disease.    Anion gap 7 5 - 15  CBC     Status: Abnormal   Collection Time: 09/29/16  6:07 AM  Result Value Ref Range   WBC 8.1 4.0 - 10.5 K/uL   RBC 2.43 (L) 3.87 - 5.11 MIL/uL   Hemoglobin 7.8 (L) 12.0 - 15.0 g/dL   HCT 25.0 (L) 36.0 - 46.0 %   MCV 102.9 (H) 78.0 - 100.0 fL   MCH 32.1 26.0 - 34.0 pg   MCHC 31.2 30.0 - 36.0 g/dL   RDW 24.8 (H) 11.5 - 15.5 %   Platelets 95 (L) 150 - 400 K/uL    Comment: CONSISTENT WITH PREVIOUS RESULT  Glucose, capillary     Status: Abnormal   Collection Time: 09/29/16  7:38 AM   Result Value Ref Range   Glucose-Capillary 129 (H) 65 - 99 mg/dL   Comment 1 Notify RN   Glucose, capillary     Status: Abnormal   Collection Time: 09/29/16 11:16 AM  Result Value Ref Range   Glucose-Capillary 137 (H) 65 - 99 mg/dL   Comment 1 Notify RN   Glucose, capillary     Status: Abnormal   Collection Time: 09/29/16  4:43 PM  Result Value Ref Range   Glucose-Capillary 151 (H) 65 - 99 mg/dL  Glucose, capillary     Status: Abnormal   Collection Time: 09/29/16  8:56 PM  Result Value Ref Range   Glucose-Capillary 125 (H) 65 - 99 mg/dL   Comment 1 Notify RN    Comment 2 Document in Chart   Glucose, capillary     Status: None   Collection Time: 09/30/16  8:05 AM  Result Value Ref Range   Glucose-Capillary 84 65 - 99 mg/dL   Comment 1 Notify RN   Magnesium     Status: None   Collection Time: 09/30/16  9:11 AM  Result Value Ref Range   Magnesium 1.8 1.7 - 2.4 mg/dL  Phosphorus     Status: None   Collection Time: 09/30/16  9:11 AM  Result Value Ref Range   Phosphorus 3.2 2.5 - 4.6 mg/dL  Comprehensive metabolic panel     Status: Abnormal   Collection Time: 09/30/16  9:11 AM  Result Value Ref Range   Sodium 137 135 - 145 mmol/L   Potassium 3.4 (L) 3.5 - 5.1 mmol/L   Chloride 105 101 - 111 mmol/L   CO2 25 22 - 32 mmol/L   Glucose, Bld 95 65 - 99 mg/dL  BUN 15 6 - 20 mg/dL   Creatinine, Ser 0.89 0.44 - 1.00 mg/dL   Calcium 7.1 (L) 8.9 - 10.3 mg/dL   Total Protein 4.5 (L) 6.5 - 8.1 g/dL   Albumin 1.5 (L) 3.5 - 5.0 g/dL   AST 20 15 - 41 U/L   ALT 14 14 - 54 U/L   Alkaline Phosphatase 489 (H) 38 - 126 U/L   Total Bilirubin 0.9 0.3 - 1.2 mg/dL   GFR calc non Af Amer >60 >60 mL/min   GFR calc Af Amer >60 >60 mL/min    Comment: (NOTE) The eGFR has been calculated using the CKD EPI equation. This calculation has not been validated in all clinical situations. eGFR's persistently <60 mL/min signify possible Chronic Kidney Disease.    Anion gap 7 5 - 15  CBC     Status:  Abnormal   Collection Time: 09/30/16  9:11 AM  Result Value Ref Range   WBC 6.6 4.0 - 10.5 K/uL   RBC 2.32 (L) 3.87 - 5.11 MIL/uL   Hemoglobin 7.4 (L) 12.0 - 15.0 g/dL    Comment: CONSISTENT WITH PREVIOUS RESULT   HCT 23.4 (L) 36.0 - 46.0 %   MCV 100.9 (H) 78.0 - 100.0 fL   MCH 31.9 26.0 - 34.0 pg   MCHC 31.6 30.0 - 36.0 g/dL   RDW 23.5 (H) 11.5 - 15.5 %   Platelets 94 (L) 150 - 400 K/uL    Comment: CONSISTENT WITH PREVIOUS RESULT  Glucose, capillary     Status: Abnormal   Collection Time: 09/30/16 11:32 AM  Result Value Ref Range   Glucose-Capillary 124 (H) 65 - 99 mg/dL   Comment 1 Notify RN     Current Facility-Administered Medications  Medication Dose Route Frequency Provider Last Rate Last Dose  . acetaminophen (TYLENOL) tablet 650 mg  650 mg Oral Q6H PRN Eugenie Filler, MD   650 mg at 09/15/16 1208   Or  . acetaminophen (TYLENOL) suppository 650 mg  650 mg Rectal Q6H PRN Eugenie Filler, MD      . ALPRAZolam Duanne Moron) tablet 0.25 mg  0.25 mg Oral QHS Ambrose Finland, MD   0.25 mg at 09/29/16 2136  . alum & mag hydroxide-simeth (MAALOX/MYLANTA) 200-200-20 MG/5ML suspension 30 mL  30 mL Oral BID PRN Eugenie Filler, MD   30 mL at 09/13/16 9480  . ARIPiprazole (ABILIFY) tablet 5 mg  5 mg Oral Daily Ambrose Finland, MD   5 mg at 09/30/16 0933  . aspirin EC tablet 81 mg  81 mg Oral Daily Patrecia Pour, MD   81 mg at 09/30/16 0932  . dronabinol (MARINOL) capsule 5 mg  5 mg Oral BID AC Sheikh, Omair Leeds, DO   5 mg at 09/30/16 1200  . DULoxetine (CYMBALTA) DR capsule 60 mg  60 mg Oral Daily Ambrose Finland, MD   60 mg at 09/30/16 0932  . feeding supplement (ENSURE ENLIVE) (ENSURE ENLIVE) liquid 237 mL  237 mL Oral TID BM Mariel Aloe, MD   237 mL at 09/30/16 1000  . feeding supplement (PRO-STAT SUGAR FREE 64) liquid 30 mL  30 mL Per Tube Daily Ladene Artist., MD   30 mL at 09/27/16 1014  . feeding supplement (VITAL 1.5 CAL) liquid 1,000 mL   1,000 mL Per Tube Q24H Rosita Fire, MD      . fluconazole (DIFLUCAN) tablet 400 mg  400 mg Oral Daily Michel Bickers, MD   400  mg at 09/30/16 0932  . fluticasone (FLONASE) 50 MCG/ACT nasal spray 2 spray  2 spray Each Nare Daily PRN Eugenie Filler, MD      . furosemide (LASIX) injection 40 mg  40 mg Intravenous Q12H Rosita Fire, MD   40 mg at 09/30/16 1200  . heparin injection 5,000 Units  5,000 Units Subcutaneous Q8H Ladene Artist., MD   5,000 Units at 09/30/16 [phone]  . insulin aspart (novoLOG) injection 0-5 Units  0-5 Units Subcutaneous QHS Eugenie Filler, MD   Stopped at 09/18/2016 2212  . insulin aspart (novoLOG) injection 0-9 Units  0-9 Units Subcutaneous TID WC Mariel Aloe, MD   1 Units at 09/30/16 1200  . ipratropium-albuterol (DUONEB) 0.5-2.5 (3) MG/3ML nebulizer solution 3 mL  3 mL Nebulization Q4H PRN Sheikh, Omair Latif, DO      . lidocaine (XYLOCAINE) 1 % (with pres) injection   Infiltration PRN Saverio Danker, PA-C   10 mL at 09/18/16 1212  . lidocaine (XYLOCAINE) 2 % jelly 1 application  1 application Urethral Once Ladene Artist., MD      . lisdexamfetamine (VYVANSE) capsule 20 mg  20 mg Oral Daily Ambrose Finland, MD   20 mg at 09/30/16 0932  . loperamide (IMODIUM) capsule 4 mg  4 mg Oral PRN Mariel Aloe, MD   4 mg at 09/13/16 2137  . metoprolol tartrate (LOPRESSOR) tablet 12.5 mg  12.5 mg Oral BID Mariel Aloe, MD   12.5 mg at 09/30/16 0932  . morphine 2 MG/ML injection 2 mg  2 mg Intravenous Q6H PRN Raiford Noble Califon, DO   2 mg at 09/29/16 0542  . ondansetron (ZOFRAN) tablet 4 mg  4 mg Oral BID PRN Ladene Artist., MD   4 mg at 09/29/16 2137  . pantoprazole (PROTONIX) EC tablet 40 mg  40 mg Oral QHS Karren Cobble, RPH   40 mg at 09/29/16 2136  . senna-docusate (Senokot-S) tablet 1 tablet  1 tablet Oral QHS PRN Eugenie Filler, MD      . sodium chloride flush (NS) 0.9 % injection 3 mL  3 mL Intravenous Q12H  Eugenie Filler, MD   3 mL at 09/30/16 0932  . sodium phosphate (FLEET) 7-19 GM/118ML enema 1 enema  1 enema Rectal Once PRN Eugenie Filler, MD      . sorbitol 70 % solution 30 mL  30 mL Oral Daily PRN Eugenie Filler, MD      . traMADol Veatrice Bourbon) tablet 50 mg  50 mg Oral Q6H PRN Schorr, Rhetta Mura, NP   50 mg at 09/30/16 1214    Musculoskeletal: Strength & Muscle Tone: decreased Gait & Station: unable to stand Patient leans: N/A  Psychiatric Specialty Exam: Physical Exam as per history and physical   ROS  Blood pressure (!) 105/43, pulse 96, temperature 98 F (36.7 C), temperature source Oral, resp. rate 18, height '5\' 3"'  (1.6 m), weight 70.1 kg (154 lb 9.6 oz), SpO2 100 %.Body mass index is 27.39 kg/m.  General Appearance: Casual  Eye Contact:  Good  Speech:  Clear and Coherent and Slow  Volume:  Decreased  Mood:  Anxious and Depressed  Affect:  Constricted and Depressed  Thought Process:  Coherent and Goal Directed  Orientation:  Full (Time, Place, and Person)  Thought Content:  Logical and indecisiveness noted.  Suicidal Thoughts:  No  Homicidal Thoughts:  No  Memory:  Immediate;   Fair  Recent;   Fair Remote;   Fair  Judgement:  Fair  Insight:  Fair  Psychomotor Activity:  Psychomotor Retardation, but better with current medication regimen, recognized by staff and family  Concentration:  Concentration: Fair and Attention Span: Fair  Recall:  [place] of Knowledge:  Good  Language:  Good  Akathisia:  Negative  Handed:  Right  AIMS (if indicated):     Assets:  Communication Skills Desire for Improvement Financial Resources/Insurance Housing Intimacy Leisure Time Resilience Social Support Transportation  ADL's:  Impaired  Cognition:  Impaired,  Mild  Sleep:        Treatment Plan Summary: 60 years old female with generalized weakness, depression, anxiety and increased psychomotor retardation. Patient has a status post aortic graft couple months ago  for Aortic dissection. Patient continued to have chest congestion, shortness of breath and generalized weakness and cardiologist has been evaluating for cardiac catheterization for possible coronary blockages.   Maj. depressive disorder, recurrent Generalized anxiety disorder  Recommendation: Continue Xanax 0.25 mg Qhs for anxiety Continue Cymbalta to 60 mg once daily for better control of depression  Continue Vyvanse 20 mg daily - as per cardiology she does not have heart rhythm problems or no history of seizures. Monitor for the psychomotor activity  Patient family stated that patient was able to eat and drink better yesterday some this morning.  Appreciate psychiatric consultation and follow up as clinically required Please contact [phone] or [phone] if needs further assistance  Disposition: Patient does not meet criteria for psychiatric inpatient admission. Supportive therapy provided about ongoing stressors.  Ambrose Finland, MD 09/30/2016 1:42 PM

## 2016-10-01 DIAGNOSIS — R195 Other fecal abnormalities: Secondary | ICD-10-CM

## 2016-10-01 DIAGNOSIS — Z7189 Other specified counseling: Secondary | ICD-10-CM

## 2016-10-01 DIAGNOSIS — R110 Nausea: Secondary | ICD-10-CM

## 2016-10-01 DIAGNOSIS — K3184 Gastroparesis: Secondary | ICD-10-CM

## 2016-10-01 LAB — PREPARE RBC (CROSSMATCH)

## 2016-10-01 LAB — GLUCOSE, CAPILLARY
GLUCOSE-CAPILLARY: 97 mg/dL (ref 65–99)
GLUCOSE-CAPILLARY: 97 mg/dL (ref 65–99)
GLUCOSE-CAPILLARY: 98 mg/dL (ref 65–99)
Glucose-Capillary: 86 mg/dL (ref 65–99)
Glucose-Capillary: 93 mg/dL (ref 65–99)
Glucose-Capillary: 101 mg/dL — ABNORMAL HIGH (ref 65–99)

## 2016-10-01 LAB — BASIC METABOLIC PANEL
ANION GAP: 6 (ref 5–15)
BUN: 12 mg/dL (ref 6–20)
CALCIUM: 7 mg/dL — AB (ref 8.9–10.3)
CO2: 26 mmol/L (ref 22–32)
Chloride: 105 mmol/L (ref 101–111)
Creatinine, Ser: 0.88 mg/dL (ref 0.44–1.00)
Glucose, Bld: 90 mg/dL (ref 65–99)
POTASSIUM: 2.9 mmol/L — AB (ref 3.5–5.1)
Sodium: 137 mmol/L (ref 135–145)

## 2016-10-01 LAB — CBC
HEMATOCRIT: 21.9 % — AB (ref 36.0–46.0)
Hemoglobin: 6.9 g/dL — CL (ref 12.0–15.0)
MCH: 31.9 pg (ref 26.0–34.0)
MCHC: 31.5 g/dL (ref 30.0–36.0)
MCV: 101.4 fL — AB (ref 78.0–100.0)
PLATELETS: 99 10*3/uL — AB (ref 150–400)
RBC: 2.16 MIL/uL — AB (ref 3.87–5.11)
RDW: 23.5 % — AB (ref 11.5–15.5)
WBC: 5.5 10*3/uL (ref 4.0–10.5)

## 2016-10-01 LAB — MAGNESIUM: Magnesium: 1.3 mg/dL — ABNORMAL LOW (ref 1.7–2.4)

## 2016-10-01 LAB — PHOSPHORUS: Phosphorus: 3.2 mg/dL (ref 2.5–4.6)

## 2016-10-01 LAB — OCCULT BLOOD X 1 CARD TO LAB, STOOL: Fecal Occult Bld: POSITIVE — AB

## 2016-10-01 MED ORDER — ONDANSETRON HCL 4 MG/2ML IJ SOLN
4.0000 mg | Freq: Four times a day (QID) | INTRAMUSCULAR | Status: DC
  Administered 2016-10-02 – 2016-10-03 (×5): 4 mg via INTRAVENOUS
  Filled 2016-10-01 (×5): qty 2

## 2016-10-01 MED ORDER — MENTHOL 3 MG MT LOZG
1.0000 | LOZENGE | OROMUCOSAL | Status: DC | PRN
  Filled 2016-10-01: qty 9

## 2016-10-01 MED ORDER — SODIUM CHLORIDE 0.9 % IV SOLN
Freq: Once | INTRAVENOUS | Status: AC
  Administered 2016-10-01: 10:00:00 via INTRAVENOUS

## 2016-10-01 MED ORDER — MAGNESIUM SULFATE 2 GM/50ML IV SOLN
2.0000 g | Freq: Once | INTRAVENOUS | Status: AC
Start: 2016-10-01 — End: 2016-10-01
  Administered 2016-10-01: 2 g via INTRAVENOUS
  Filled 2016-10-01: qty 50

## 2016-10-01 MED ORDER — POTASSIUM CHLORIDE 10 MEQ/100ML IV SOLN
10.0000 meq | INTRAVENOUS | Status: AC
  Administered 2016-10-01 (×4): 10 meq via INTRAVENOUS
  Filled 2016-10-01 (×4): qty 100

## 2016-10-01 MED ORDER — INSULIN ASPART 100 UNIT/ML ~~LOC~~ SOLN
0.0000 [IU] | Freq: Three times a day (TID) | SUBCUTANEOUS | Status: DC
  Administered 2016-10-02 – 2016-10-04 (×3): 1 [IU] via SUBCUTANEOUS
  Administered 2016-10-05: 2 [IU] via SUBCUTANEOUS
  Administered 2016-10-05 – 2016-10-12 (×10): 1 [IU] via SUBCUTANEOUS
  Administered 2016-10-17 – 2016-10-18 (×2): 2 [IU] via SUBCUTANEOUS
  Administered 2016-10-19: 1 [IU] via SUBCUTANEOUS
  Administered 2016-10-19: 2 [IU] via SUBCUTANEOUS

## 2016-10-01 MED ORDER — POTASSIUM CHLORIDE CRYS ER 20 MEQ PO TBCR
40.0000 meq | EXTENDED_RELEASE_TABLET | Freq: Two times a day (BID) | ORAL | Status: DC
  Administered 2016-10-01 – 2016-10-06 (×7): 40 meq via ORAL
  Filled 2016-10-01 (×10): qty 2

## 2016-10-01 MED ORDER — ONDANSETRON HCL 4 MG PO TABS
4.0000 mg | ORAL_TABLET | Freq: Four times a day (QID) | ORAL | Status: DC | PRN
  Administered 2016-10-01: 4 mg via ORAL
  Filled 2016-10-01: qty 1

## 2016-10-01 NOTE — Progress Notes (Signed)
PROGRESS NOTE    Haley Graham  ZOX:[id number] DOB: 1955-03-03 DOA: 09/04/2016 PCP: Glenda Chroman, MD   Brief Narrative: 61 y.o.femalewith medical history significant of [place] type B aortic dissection status post descending thoracic aortic stent graft placement per vascular surgery during last hospitalization from 06/20/2016-08/22/2016, a history of diabetes mellitus with gastroparesis, gastroesophageal reflux disease, hypertension, IBS, abdominal aortic aneurysm, anxiety, depression presented with generalized weakness and sepsis on admission. The blood culture grwing is to therefore started on fluconazole for candidemia. Evaluated by ID. ID recommended continuing fluconazole 400 mg daily through 9/26. Patient now with congestive heart failure, hypoxia and echo showed reduced EF of 30-35% with akinesis of of mid-apicalanteroseptal and anterior myocardium.  Assessment & Plan:  # Sepsis due to candidemia: -Continue fluconazole until September 26 and needs follow-up with ophthalmologist in a month as per infectious disease.  #Acute systolic congestive heart failure: Continue IV Lasix 40 mg twice a day. Patient with lower extremity edema and crackles bilaterally. Chest x-ray with a small pleural effusion. - Cardiology consult appreciated. Monitor BMP, urine output. May need further cardiac eval ? Cath defer to cardiology team.  #Prolonged QTC: Discontinue Azithromycin and abilify. Monitor in telemetry.  #Severe protein calorie malnutrition: Patient has NG tube for feeding. Has nausea. Patient has NG tube because of severe malnutrition and decreased oral intake. Once patient has good oral intake may not need NG tube. I discussed with the patient at bedside. Recommended to continue oral supplement. Dietary consult. On marinol.  #Acute kidney injury resolved  #Acute on Chronic anemia likely due to chronic disease vs acute GI bleed: Iron stores acceptable. Fecal occult blood test positive. On  Protonix. Plan to transfuse 2 units of red blood cell today. Monitor CBC. GI consult requested to evaluate possible upper GI bleed.  #Thrombocytopenia likely due to acute illness: No sign of bleeding. Monitor CBC.  #Anxiety depression: Evaluated by psychiatrist. -Continue Xanax, Cymbalta and Vyvanse per psychiatrist  # hypokalemia/hypomagnesemia: Repleted IV and oral potassium chloride. Repleted IV magnesium sulfate. Repeat lab in the morning.   DVT prophylaxis: SCD. Patient with anemia and thrombocytopenia Code Status: Full code Family Communication: Discussed with the patient's husband and daughter at bedside yesterday Disposition Plan: Currently admitted, likely discharge to skilled facility in 3-4 days    Consultants:   Infectious disease  Psychiatrist  Palliative care  Cardiologist  GI  Procedures:  Thoracentesis 8/17  Echo 8/28 EF 30-35%, akinesis of mid-apicalanteroseptal and anterior myocardium.  Diastolic dysfunction.  Echo 8/13 EF 81-19, diastolic dysfunction, lipomatous hypertrophy Antimicrobials: Fluconazole Subjective: Seen and examined at bedside. Felt better in terms of shortness of breath. Has generalized weakness, fatigue. No chest pain, headache.  Objective: Vitals:   09/30/16 1220 09/30/16 1601 09/30/16 2037 10/01/16 0517  BP: (!) 105/43 (!) 92/35 (!) 117/47 (!) 103/34  Pulse: 96 92 (!) 110 (!) 102  Resp: 18 18 18 18   Temp: 98 F (36.7 C) 98.9 F (37.2 C) 98.2 F (36.8 C) 97.7 F (36.5 C)  TempSrc: Oral Oral Oral Oral  SpO2: 100% 100% 100% 100%  Weight:    69.2 kg (152 lb 8 oz)  Height:        Intake/Output Summary (Last 24 hours) at 10/01/16 1311 Last data filed at 10/01/16 1158  Gross per 24 hour  Intake              237 ml  Output             1725 ml  Net            -1488 ml   Filed Weights   09/29/16 0535 09/30/16 0353 10/01/16 0517  Weight: 69.5 kg (153 lb 4.8 oz) 70.1 kg (154 lb 9.6 oz) 69.2 kg (152 lb 8 oz)     Examination:  General exam: Lying on bed comfortable, has NG tube Respiratory system: Bibasal decreased breath sound, respiratory effort normal Cardiovascular system: Regular rate rhythm, S1 is normal. Bilateral lower extremities edema. Gastrointestinal system: Abdomen is  soft and nontender. Normal bowel sounds heard. Central nervous system: Alert and oriented. No focal neurological deficits. Extremities: Symmetric 5 x 5 power. Skin: No rashes, lesions or ulcers Psychiatry: Judgement and insight appear normal. Mood & affect appropriate.     Data Reviewed: I have personally reviewed following labs and imaging studies  CBC:  Recent Labs Lab 09/27/16 1151 09/28/16 0731 09/29/16 0607 09/30/16 0911 10/01/16 0424  WBC 7.1 9.5 8.1 6.6 5.5  HGB 8.3* 8.2* 7.8* 7.4* 6.9*  HCT 24.9* 26.2* 25.0* 23.4* 21.9*  MCV 102.0* 100.8* 102.9* 100.9* 101.4*  PLT 98* 100* 95* 94* 99*   Basic Metabolic Panel:  Recent Labs Lab 09/27/16 0610 09/28/16 0731 09/29/16 0607 09/30/16 0911 10/01/16 0424  NA 138 140 139 137 137  K 3.5 4.1 3.6 3.4* 2.9*  CL 107 108 107 105 105  CO2 24 23 25 25 26   GLUCOSE 133* 138* 127* 95 90  BUN 12 15 15 15 12   CREATININE 0.87 0.89 0.86 0.89 0.88  CALCIUM 7.9* 7.6* 6.9* 7.1* 7.0*  MG 1.6* 1.7 1.5* 1.8 1.3*  PHOS 3.3 4.0 3.8 3.2 3.2   GFR: Estimated Creatinine Clearance: 63.4 mL/min (by C-G formula based on SCr of 0.88 mg/dL). Liver Function Tests:  Recent Labs Lab 09/26/16 0451 09/27/16 0610 09/28/16 0731 09/29/16 0607 09/30/16 0911  AST 17 21 25 23 20   ALT 11* 11* 13* 13* 14  ALKPHOS 329* 488* 756* 660* 489*  BILITOT 0.7 1.0 1.2 1.0 0.9  PROT 4.4* 5.2* 4.9* 4.5* 4.5*  ALBUMIN 1.7* 2.1* 1.9* 1.7* 1.5*   No results for input(s): LIPASE, AMYLASE in the last 168 hours. No results for input(s): AMMONIA in the last 168 hours. Coagulation Profile: No results for input(s): INR, PROTIME in the last 168 hours. Cardiac Enzymes: No results for  input(s): CKTOTAL, CKMB, CKMBINDEX, TROPONINI in the last 168 hours. BNP (last 3 results) No results for input(s): PROBNP in the last 8760 hours. HbA1C: No results for input(s): HGBA1C in the last 72 hours. CBG:  Recent Labs Lab 09/30/16 2033 10/01/16 0022 10/01/16 0435 10/01/16 0812 10/01/16 1156  GLUCAP 119* 97 86 97 93   Lipid Profile: No results for input(s): CHOL, HDL, LDLCALC, TRIG, CHOLHDL, LDLDIRECT in the last 72 hours. Thyroid Function Tests:  Recent Labs  09/28/16 2217  TSH 0.713   Anemia Panel: No results for input(s): VITAMINB12, FOLATE, FERRITIN, TIBC, IRON, RETICCTPCT in the last 72 hours. Sepsis Labs: No results for input(s): PROCALCITON, LATICACIDVEN in the last 168 hours.  No results found for this or any previous visit (from the past 240 hour(s)).       Radiology Studies: Dg Chest Port 1 View  Result Date: 09/30/2016 CLINICAL DATA:  Shortness of breath. EXAM: PORTABLE CHEST 1 VIEW COMPARISON:  Radiographs 09/26/2016.  CT 09/27/2016. FINDINGS: 1033 hours. Feeding tube remains in place, tip not visualized. The heart size and mediastinal contours are stable status post thoracic aortic endograft repair. There is a persistent left pleural  effusion and mild bibasilar atelectasis, although the overall pulmonary aeration has improved. There is no edema or pneumothorax. The bones appear unchanged. IMPRESSION: Overall improved pulmonary aeration with persistent bibasilar atelectasis and small left pleural effusion. No new findings. Electronically Signed   By: Richardean Sale M.D.   On: 09/30/2016 10:51        Scheduled Meds: . ALPRAZolam  0.25 mg Oral QHS  . aspirin EC  81 mg Oral Daily  . dronabinol  5 mg Oral BID AC  . DULoxetine  60 mg Oral Daily  . feeding supplement (ENSURE ENLIVE)  237 mL Oral TID BM  . feeding supplement (PRO-STAT SUGAR FREE 64)  30 mL Oral TID WC  . feeding supplement (VITAL 1.5 CAL)  1,000 mL Per Tube Q24H  . fluconazole  400 mg  Oral Daily  . furosemide  40 mg Intravenous Q12H  . insulin aspart  0-5 Units Subcutaneous QHS  . insulin aspart  0-9 Units Subcutaneous TID WC  . lisdexamfetamine  20 mg Oral Daily  . metoprolol tartrate  12.5 mg Oral BID  . pantoprazole  40 mg Oral QHS  . potassium chloride  40 mEq Oral BID  . sodium chloride flush  3 mL Intravenous Q12H   Continuous Infusions: . magnesium sulfate 1 - 4 g bolus IVPB       LOS: 22 days    Dron Tanna Furry, MD [facility] Pager [phone]  If 7PM-7AM, please contact night-coverage [email or web address] Password [email or web address] 10/01/2016, 1:11 PM

## 2016-10-01 NOTE — Progress Notes (Signed)
CRITICAL VALUE ALERT  Critical Value: HGB 6.9  Date & Time Notied: 8/30,  0615  Provider Notified:[facility] on call  Orders Received/Actions taken: consent for Blood prepare RBC transfuse 1 unit

## 2016-10-01 NOTE — Progress Notes (Addendum)
Progress Note  Patient Name: Haley Graham Date of Encounter: 10/01/2016  Primary Cardiologist: New to Dr. Sallyanne Kuster  Subjective   Weak and tired.   Inpatient Medications    Scheduled Meds: . ALPRAZolam  0.25 mg Oral QHS  . aspirin EC  81 mg Oral Daily  . dronabinol  5 mg Oral BID AC  . DULoxetine  60 mg Oral Daily  . feeding supplement (ENSURE ENLIVE)  237 mL Oral TID BM  . feeding supplement (PRO-STAT SUGAR FREE 64)  30 mL Oral TID WC  . feeding supplement (VITAL 1.5 CAL)  1,000 mL Per Tube Q24H  . fluconazole  400 mg Oral Daily  . furosemide  40 mg Intravenous Q12H  . insulin aspart  0-5 Units Subcutaneous QHS  . insulin aspart  0-9 Units Subcutaneous TID WC  . lisdexamfetamine  20 mg Oral Daily  . metoprolol tartrate  12.5 mg Oral BID  . pantoprazole  40 mg Oral QHS  . potassium chloride  40 mEq Oral BID  . sodium chloride flush  3 mL Intravenous Q12H   Continuous Infusions: . magnesium sulfate 1 - 4 g bolus IVPB    . potassium chloride 10 mEq (10/01/16 1028)   PRN Meds: acetaminophen **OR** acetaminophen, alum & mag hydroxide-simeth, fluticasone, ipratropium-albuterol, lidocaine, loperamide, morphine injection, ondansetron, senna-docusate, sodium phosphate, sorbitol, traMADol   Vital Signs    Vitals:   09/30/16 1220 09/30/16 1601 09/30/16 2037 10/01/16 0517  BP: (!) 105/43 (!) 92/35 (!) 117/47 (!) 103/34  Pulse: 96 92 (!) 110 (!) 102  Resp: 18 18 18 18   Temp: 98 F (36.7 C) 98.9 F (37.2 C) 98.2 F (36.8 C) 97.7 F (36.5 C)  TempSrc: Oral Oral Oral Oral  SpO2: 100% 100% 100% 100%  Weight:    152 lb 8 oz (69.2 kg)  Height:        Intake/Output Summary (Last 24 hours) at 10/01/16 1100 Last data filed at 10/01/16 1030  Gross per 24 hour  Intake              477 ml  Output             1326 ml  Net             -849 ml   Filed Weights   09/29/16 0535 09/30/16 0353 10/01/16 0517  Weight: 153 lb 4.8 oz (69.5 kg) 154 lb 9.6 oz (70.1 kg) 152 lb 8 oz  (69.2 kg)    Telemetry    Sinus tachycardia at rate of 100s- Personally Reviewed  ECG    EKG 09/30/16: sinus tachycardia at rate of 102 bpm  with TWI in anterior lateral leads, QT/QTc 370/482 ms - personally reviwed  Physical Exam   GEN: Ill appearing female no NAD Neck: No JVD Cardiac: Regular tachycardic, rubs, or gallops.  Respiratory: faint rales with course breath sound  GI: Soft, nontender, non-distended  MS: No edema; No deformity. Neuro:  Nonfocal  Psych: Normal affect  Extremities: trace BL LE edema  Labs    Chemistry Recent Labs Lab 09/28/16 0731 09/29/16 0607 09/30/16 0911 10/01/16 0424  NA 140 139 137 137  K 4.1 3.6 3.4* 2.9*  CL 108 107 105 105  CO2 23 25 25 26   GLUCOSE 138* 127* 95 90  BUN 15 15 15 12   CREATININE 0.89 0.86 0.89 0.88  CALCIUM 7.6* 6.9* 7.1* 7.0*  PROT 4.9* 4.5* 4.5*  --   ALBUMIN 1.9* 1.7* 1.5*  --  AST 25 23 20   --   ALT 13* 13* 14  --   ALKPHOS 756* 660* 489*  --   BILITOT 1.2 1.0 0.9  --   GFRNONAA >60 >60 >60 >60  GFRAA >60 >60 >60 >60  ANIONGAP 9 7 7 6      Hematology Recent Labs Lab 09/29/16 0607 09/30/16 0911 10/01/16 0424  WBC 8.1 6.6 5.5  RBC 2.43* 2.32* 2.16*  HGB 7.8* 7.4* 6.9*  HCT 25.0* 23.4* 21.9*  MCV 102.9* 100.9* 101.4*  MCH 32.1 31.9 31.9  MCHC 31.2 31.6 31.5  RDW 24.8* 23.5* 23.5*  PLT 95* 94* 99*    Cardiac EnzymesNo results for input(s): TROPONINI in the last 168 hours. No results for input(s): TROPIPOC in the last 168 hours.   BNP Recent Labs Lab 09/28/16 0731  BNP 1,093.3*     DDimer No results for input(s): DDIMER in the last 168 hours.   Radiology    Dg Chest Port 1 View  Result Date: 09/30/2016 CLINICAL DATA:  Shortness of breath. EXAM: PORTABLE CHEST 1 VIEW COMPARISON:  Radiographs 09/26/2016.  CT 09/27/2016. FINDINGS: 1033 hours. Feeding tube remains in place, tip not visualized. The heart size and mediastinal contours are stable status post thoracic aortic endograft repair.  There is a persistent left pleural effusion and mild bibasilar atelectasis, although the overall pulmonary aeration has improved. There is no edema or pneumothorax. The bones appear unchanged. IMPRESSION: Overall improved pulmonary aeration with persistent bibasilar atelectasis and small left pleural effusion. No new findings. Electronically Signed   By: Richardean Sale M.D.   On: 09/30/2016 10:51    Cardiac Studies   Study Conclusions 09/29/16  - Left ventricle: The cavity size was normal. Wall thickness was   normal. Systolic function was moderately to severely reduced. The   estimated ejection fraction was in the range of 30% to 35%. There   is akinesis of the mid-apicalanteroseptal and anterior   myocardium. Doppler parameters are consistent with abnormal left   ventricular relaxation (grade 1 diastolic dysfunction). - Aortic valve: Trileaflet; mildly thickened, mildly calcified   leaflets. - Mitral valve: Calcified annulus. There was mild regurgitation.  Impressions:  - EF is reduced when compared to prior study  Patient Profile     61 y.o. female Haley Graham is a 61 y.o. female with a hx  descending thoracic aortic stent graft placement of [place] type B aortic dissection, DM, HTN, IBS, gastroparesis  and GERD admitted sepsis with concern for Enteritis.  Patient is s/p thoracentesis on 8/17 due to bilateral pleural effusion. Treated withfluconazole for a yeast UTI however later blood culture grown yeast on 8/18. Seen by ID and plan to continue fluconazole 400 mg daily through 9/26. She also transfused 1 unit of pRBC. Now stool guaiac is positive. Seen by palliative and psychiatry.   Assessment & Plan    1. Acute systolic CHF/ Acute pulmonary edema due to new regional LV dysfunction. - BNP was elevated to 1093 on 8/27  (it was 188 on 09/12/16). She received bolus of fluid on 8/26. Echo 8/28 showed LVEF reduced to 30-35% (was 65-70% on 8/13) with akinesis of the  mid-apicalanteroseptal and anterior myocardium and grade 1 DD.  - Now on IV lasix 40mg  BID with improved symptoms.  - Cath tentatively planned today now worsening anemia with positive stool guaiac--> will cancelled. May be plan repeat echo in a week with lexiscan. See yesterday's consult note for detail.  - Continue BB. Add ACE/ARB when stable  BP.   2. QT prolongation - improved this morning with medication adjustment.   3. Anemia - required transfusion earlier on admission. 1 unit of pRBC on 8/17. Hgb further down to 6.9. Positive stool guaiac. Per primary team.   4. [place] type B aortic dissection status post descending thoracic aortic stent graft placement 6/12 - Last CTA showed stable aortic stent graft  5. HTN - BP stable on low dose BB.   6. Hypokalemia - K of 2.9 today. On supplement, now on Kdur 40mg  BID.   Signed, Leanor Kail, PA  10/01/2016, 11:00 AM    I have seen and examined the patient along with Bhagat,Bhavinkumar, PA.  I have reviewed the chart, notes and new data.  I agree with PA's note.  Key new complaints: weak (probably combination of hypokalemia and worsening anemia), but no longer orthopneic Key examination changes: obesity limits the exam, probably still hypervolemic Key new findings / data: Hgb 6.9, K 2.9. FOBT +ve.  PLAN: - defer cath due to worsening anemia/persistent thrombocytopenia and electrolyte abnormalities. - despite hemoccult +ve stool, recent labs did not show Fe deficiency. Concomitant thrombocytopenia raises concern for bone marrow suppression. Normal B12 and folate recently. Reticulocytes checked twice this year including August 8 consistent with hyporegenerative anemia. Recommend Hematology evaluation. She is getting a transfusion of PRBC. - replace K - discussed the issues surrounding the aortic stent-graft and catheterization and Dr. Trula Slade reviewed the images. He believes that a right radial approach cath can be performed with  low risk of stent disruption. However, cath on hold for reasons described above.  At this point, a more conservative approach to her coronary evaluation is appropriate. Would repeat an echo early next week. If EF is still low (and if anemia and hypokalemia issues have been resolved), then we would need to proceed with coronary angiography via right radial approach. If EF has normalized, then it is more reasonable to perform a noninvasive coronary evaluation with either CT angiography or a Lexiscan Myoview.  Sanda Klein, MD, [place] [phone] 10/01/2016, 1:00 PM

## 2016-10-01 NOTE — Clinical Social Work Note (Addendum)
CSW met with patient and she stated she is still interested in going to [facility] SNF once stable for discharge. Per MD in morning progression, patient will be here through the weekend. CSW left voicemail for admissions coordinator to see if they wanted to go ahead and start auth. Awaiting call back.  Dayton Scrape, [place] [phone]  11:27 am CSW spoke with admissions coordinator who stated she will start insurance authorization on day of discharge due to patient previously having a set back. She stated she will likely get it same day. CSW will update admissions coordinator on Monday regarding anticipated discharge.  Dayton Scrape, [place]

## 2016-10-01 NOTE — Progress Notes (Signed)
Nutrition Follow-up  DOCUMENTATION CODES:   Severe malnutrition in context of chronic illness  INTERVENTION:   Discussed nutrition poc in detail with Wadie Lessen NP with Palliative Care. Current nutrition plan not meeting pt's nutritional needs. Discussed that pt will likely always have some pain and nausea with oral and/or enteral nutrition given hx. If unable to meet nutritional needs by increasing EN to goal or via oral intake, will need to consider supplemental TPN when able (per NP, pt with fungal infection at present and initiation of TPN not possible at this time). Current TF (Vital 1.5 At 10 ml/hr) only provides 360 kcals, 16 g of protein  Recommend continuing to check electrolytes and supplementing as necessary  Pro-Stat ordered TID, if pt cannot take orally, recommend giving per Cortrak tube   Continue Vital AF 1.2 at rate of 10 ml/hr; goal rate to meet nutritional needs is 55 ml/hr plus Pro-Stat BID (120 g of protein, 2180 kcals).   Continue Ensure supplement  If nausea/vomiting, recommend NPO status and considering NG tube placement for decompression in setting of chronic gastroparesis, possible ileus  Recommend addition of MVI  NUTRITION DIAGNOSIS:   Malnutrition (severe) related to chronic illness (failure to thrive post repair aortic dissection) as evidenced by energy intake < or equal to 50% for > or equal to 1 month, percent weight loss.  Continues  GOAL:   Patient will meet greater than or equal to 90% of their needs  Not met, not progressing  MONITOR:   PO intake, Supplement acceptance, Labs, Weight trends, Skin, I & O's  REASON FOR ASSESSMENT:   Consult Poor PO  ASSESSMENT:    [name] with medical history significant of [place] type B aortic dissection status post descending thoracic aortic stent graft placement per vascular surgery during last hospitalization from 06/20/2016-08/22/2016, a history of diabetes mellitus with gastroparesis,  gastroesophageal reflux disease, hypertension, IBS, abdominal aortic aneurysm, anxiety. She presented with weakness and met sepsis criteria with concern for enteritis. Antibiotics started. Cultures pending.  8/28 Repeat ECHO with decreased EF 30-35%  Bites and sips of meal trays and Ensure only  Vital 1.5 restarted yesterday at rate of 10 ml/hr with orders not to increase.  Per Gabriel Cirri RN, pt tolerating TF at low rate without complaints of abdominal pain   Fecal occult positive, Hgb <7.0, receiving PRBC  Labs: potassium 2.9, magnesium 1.3, phosphorus wdl Meds: marinol  Diet Order:  DIET SOFT Room service appropriate? Yes; Fluid consistency: Thin  Skin:  Reviewed, no issues (no pressure ulcers)  Last BM:  8/29  Height:   Ht Readings from Last 1 Encounters:  09/10/16 _0  (1.6 m)    Weight:   Wt Readings from Last 1 Encounters:  10/01/16 152 lb 8 oz (69.2 kg)    Ideal Body Weight:  52.2 kg  BMI:  Body mass index is 27.01 kg/m.  Estimated Nutritional Needs:   Kcal:  2000-2200  Protein:  110-120 gm  Fluid:  2.0-2.2 L  EDUCATION NEEDS:   No education needs identified at this time  [place], [place], LDN [phone] Pager  [phone] Weekend/On-Call Pager

## 2016-10-01 NOTE — Progress Notes (Signed)
BP 89/58, HR 101.  Pt receiving 2nd unit of PRBCs.  Have notified Dr. Carolin Sicks.

## 2016-10-01 NOTE — Progress Notes (Signed)
OT Cancellation Note  Patient Details Name: Haley Graham MRN: [id number] DOB: [birth year]   Cancelled Treatment:    Reason Eval/Treat Not Completed: Medical issues which prohibited therapy.  Hbg 6.9.  Will check back.  [place], OTR/L [phone]   Lucille Passy M 10/01/2016, 1:55 PM

## 2016-10-01 NOTE — Consult Note (Signed)
[facility] Consult: 1:35 PM 10/01/2016  LOS: 22 days    Referring Provider: Dr Carolin Sicks  Primary Care Physician:  Glenda Chroman, MD Primary Gastroenterologist:  Dr Hildred Laser in [place]    Reason for Consultation:  Anemia.  FOBT positive   HPI: Haley Graham is a 61 y.o. female.  PMH type 2 DM.  Gastroparesis. GERD. Hypertension. IBS.  S/p cholecystectomy.  S/p partial hysterectomy.  Depression/anxiety.  06/10/2016 colonoscopy by Dr Laural Golden for altered bowel habits. He removed 3 polyps, 2 of these were small, one of these measured 10 mm. All 3 polyps were tubular adenomas. Sigmoid diverticulosis and hypertrophic anal papilla noted.  Deep planned repeat colonoscopy in 06/2021. 2007 colonoscopy by Dr. Aviva Signs in [place] for diarrhea, weight loss: sigmoid diverticulosis.   2013 EGD for nausea and solid food dysphagia. Dr. Laural Golden.  Small, sliding hiatal hernia otherwise normal study. Empiric dilation performed. Gastric emptying study in 2007 and 2013 confirmed delayed gastric emptying consistent with gastroparesis. HIDA scan in 2006 showed diminished gallbladder EF. This suggested moderate degree of biliary dyskinesia/cystic duct syndrome.  Admission 06/20/2016-08/22/2016 for management of aortic dissection treated with stent graft placement to descending thoracic aorta. Hgb nadir of 6.8, 9.3 at discharge.  She received a total of 6 PRBCs during the admission.  Initially had macrocytic indices but these have since normalized.  Thrombocytopenia with nadir platelets of 87 K.  Postoperatively had prolonged ileus and it was suspected she had bowel ischemia. CT abdomen pelvis with contrast 07/18/16:  Stable type B aortic dissection and stent graft. 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 Left pleural effusion  with left lower lobe consolidation. Left kidney infarct. Dilated fluid-filled large and small bowel c/w ileus stable left adrenal nodules.  Readmitted 09/10/10 with failure to thrive, sepsis, weakness, CHF.   LVEF 30 to 35% compared with 6570% on 8/13. Grade 1 diastolic dysfunction.  QT prolonged, possibly medication side effect.  CT ab/pelvis w/o contrast 09/17/16: pleural effusions.  Stable aortic arch stent graft.  Hepatic steatosis.  Displaced wall calcification in the infrarenal abdominal aorta consistent with known thrombosed dissection. Small amount of abdominal and pelvic ascites. Diffuse body wall edema consistent with anasarca.  Atrophic left kidney.  Stable left adrenal adenoma.   CT angiogram of chest 09/27/16: No definite pulmonary embolus. Stable aortic stent graft.  Decreased right pleural effusion, stable left pleural effusion. Patchy RLL consolidation. Hazy lung appearance suspicious for edema. Enlarged stomach with partially visualized esophageal tube  Tube feeds initiated 8/24, via feeding tube positioned in distal duodenum, to which she was initially intolerant with nausea and vomiting.  She is now tolerating both tube feeds as well as solid food.   Growing yeast from blood, on fluconazole through 9/26.  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   AKI has resolved.  8/17 thoracentesis to address pleural effusion. Palliative care has been meeting with the patient but she is unwilling to participate in goals of care planning. She's been refusing medications and physical therapy.  Psychiatry consult obtained for severe depression despite  multiple antidepressants, and anxiolytics.  Received transfusion times 1 PRBC 8/17 Admission 22 days ago her hemoglobin was 9.7. She received PRBC x 1 on 8/17 for Hgb drop to 7.3.   In the last 3 days hemoglobin has steadily declined from 8.2 >> 6.9 Platelets in 90s to 100.   Initial iron level low at 25, now normalized at 36. Low TIBC at 98 to this  improved over 3 weeks. Iron saturation increased. Ferritin 585. Folate and B12 levels not decreased. Stool FOBT positive 6/26 and 8/30. Stool negative for C. Difficile on 4 separate occasions from 6/1- 09/26/2016. No reports of melena or gross blood per rectum.  She is a poor historian. However it seems that for several months she's had abdominal bloating and discomfort.  Abdominal pain in the lower abdomen, worse on the left has increased since the stent graft repair. She complains of nausea but this has improved and she is able to tolerate solid food with the feeding tube supplements.  She says her rectum hurts, but what she is actually referring to is her sacrum where there is a protective bandage overlying some pressure damage.  A person who knows her well at bedside said that she's been complaining for many months of various GI problems.  I can see where in 2006 she had a small bowel follow-through for postprandial bloating and left-sided abdominal pain, nausea, this showed rapid transit through the small bowel and was a normal study.  As far back as 2013 she was prescribed Reglan but this was discontinued because it caused shakiness and made her "heart feel funny".  She has not had any dysphagia or choking/gagging on her food. She does have a tenacious cough which is productive but she can't expectorate the sputum.  Past Medical History:  Diagnosis Date  . Anxiety   . Arthritis   . Diabetes mellitus without complication ([place])   . Gastroparesis   . GERD (gastroesophageal reflux disease)   . Hypertension   . IBS (irritable bowel syndrome)     Past Surgical History:  Procedure Laterality Date  . ABDOMINAL HYSTERECTOMY    . AORTOGRAM N/A 07/24/2016   Procedure: [facility] AND ABDOMINAL AORTA  ANGIOGRAM;  Surgeon: Waynetta Sandy, MD;  Location: [facility];  Service: Vascular;  Laterality: N/A;  . CESAREAN SECTION    . CHOLECYSTECTOMY    . COLONOSCOPY    . COLONOSCOPY N/A 06/10/2016   Procedure:  COLONOSCOPY;  Surgeon: Rogene Houston, MD;  [facility];  Service: Endoscopy;  Laterality: N/A;  8:30  . IR THORACENTESIS ASP PLEURAL SPACE W/IMG GUIDE  09/18/2016  . POLYPECTOMY  06/10/2016   Procedure: POLYPECTOMY;  Surgeon: Rogene Houston, MD;  Location: [facility];  Service: Endoscopy;;  multiple colon  . THORACIC AORTIC ENDOVASCULAR STENT GRAFT N/A 08/06/2016   Procedure: THORACIC AORTIC ENDOVASCULAR STENT GRAFT/Thorasic and Abdominal Angiogram, Entravascular ultrasound., Open femoral exposure,Left Brachial access, and patch angioplasty right femoral artery.;  Surgeon: Serafina Mitchell, MD;  Location: [facility] OR;  Service: Vascular;  Laterality: N/A;  . UPPER GASTROINTESTINAL ENDOSCOPY      Prior to Admission medications   Medication Sig Start Date End Date Taking? Authorizing Provider  ALPRAZolam Duanne Moron) 0.5 MG tablet Take 0.5-1 tablets (0.25-0.5 mg total) by mouth 2 (two) times daily as needed for anxiety. 08/22/16  Yes Gold, Wayne E, PA-C  alum & mag hydroxide-simeth (MAALOX/MYLANTA) 200-200-20 MG/5ML suspension Take 30 mLs by mouth 2 (two) times daily as needed for indigestion  or heartburn.   Yes [provider]  aspirin EC 81 MG EC tablet Take 1 tablet (81 mg total) by mouth daily. 08/22/16  Yes Gold, Wayne E, PA-C  citalopram (CELEXA) 40 MG tablet Take 40 mg by mouth at bedtime.    Yes [provider]  clonazePAM (KLONOPIN) 1 MG tablet Take 1 tablet (1 mg total) by mouth at bedtime. Patient taking differently: Take 1 mg by mouth at bedtime as needed (for sleep).  08/22/16  Yes Gold, Wayne E, PA-C  fluticasone (FLONASE) 50 MCG/ACT nasal spray Place 2 sprays into both nostrils daily as needed for allergies or rhinitis.  05/17/16  Yes [provider]  glipiZIDE-metformin (METAGLIP) 5-500 MG tablet Take 0.5 tablets by mouth 2 (two) times daily before a meal. 08/22/16  Yes Gold, Wayne E, PA-C  metoprolol tartrate (LOPRESSOR) 25 MG tablet Take 25 mg by mouth 2  (two) times daily.   Yes [provider]  mometasone-formoterol (DULERA) 200-5 MCG/ACT AERO Inhale 2 puffs into the lungs 2 (two) times daily. 08/22/16  Yes Gold, Wayne E, PA-C  ondansetron (ZOFRAN) 4 MG tablet Take 4 mg by mouth every 8 (eight) hours as needed for nausea or vomiting.   Yes [provider]  pantoprazole (PROTONIX) 40 MG tablet Take 40 mg by mouth daily as needed (acid reflux/ indigestion).  11/12/11  Yes Rehman, Mechele Dawley, MD  Probiotic Product (PROBIOTIC PO) Take 1 tablet by mouth at bedtime. Gut Restore Ultimate (probiotic with zinc and vitamin C)   Yes [provider]  traMADol (ULTRAM) 50 MG tablet Take 1 tablet (50 mg total) by mouth every 6 (six) hours as needed. Patient taking differently: Take 25 mg by mouth every 6 (six) hours as needed for moderate pain.  09/01/16  Yes Ivin Poot, MD  Metoprolol Tartrate 37.5 MG TABS Take 37.5 mg by mouth 2 (two) times daily. Patient not taking: Reported on 09/08/2016 08/22/16   Jadene Pierini E, PA-C  oxyCODONE 10 MG TABS Take 1 tablet (10 mg total) by mouth 2 (two) times daily as needed for severe pain. Patient not taking: Reported on 09/06/2016 08/22/16   John Giovanni, PA-C    Scheduled Meds: . ALPRAZolam  0.25 mg Oral QHS  . aspirin EC  81 mg Oral Daily  . dronabinol  5 mg Oral BID AC  . DULoxetine  60 mg Oral Daily  . feeding supplement (ENSURE ENLIVE)  237 mL Oral TID BM  . feeding supplement (PRO-STAT SUGAR FREE 64)  30 mL Oral TID WC  . feeding supplement (VITAL 1.5 CAL)  1,000 mL Per Tube Q24H  . fluconazole  400 mg Oral Daily  . furosemide  40 mg Intravenous Q12H  . insulin aspart  0-5 Units Subcutaneous QHS  . insulin aspart  0-9 Units Subcutaneous TID WC  . lisdexamfetamine  20 mg Oral Daily  . metoprolol tartrate  12.5 mg Oral BID  . pantoprazole  40 mg Oral QHS  . potassium chloride  40 mEq Oral BID  . sodium chloride flush  3 mL Intravenous Q12H   Infusions: . magnesium sulfate 1 - 4 g  bolus IVPB 2 g (10/01/16 1325)   PRN Meds: acetaminophen **OR** acetaminophen, alum & mag hydroxide-simeth, fluticasone, ipratropium-albuterol, lidocaine, loperamide, morphine injection, ondansetron, senna-docusate, sodium phosphate, traMADol   Allergies as of 09/30/2016 - Review Complete 09/11/2016  Allergen Reaction Noted  . Penicillins Rash and Other (See Comments) 10/27/2011  . Ativan [lorazepam] Other (See Comments) 07/03/2016  .  Codeine Nausea And Vomiting 11/11/2011  . Reglan [metoclopramide] Other (See Comments) 11/30/2011    Family History  Problem Relation Age of Onset  . Cancer Mother   . Stroke Mother   . Heart attack Mother   . Stroke Father     Social History   Social History  . Marital status: Married    Spouse name: N/A  . Number of children: N/A  . Years of education: N/A   Occupational History  . Not on file.   Social History Main Topics  . Smoking status: Current Every Day Smoker    Packs/day: 0.75    Years: 23.00    Types: Cigarettes  . Smokeless tobacco: Never Used     Comment:  Smoking since age 53.  Up until her aortic dissection admission of 06/20/16, she smoked about a pack per day.   . Alcohol use No  . Drug use: No  . Sexual activity: Not on file   Other Topics Concern  . Not on file   Social History Narrative  . No narrative on file    REVIEW OF SYSTEMS: Constitutional:  Weakness, fatigue ENT:  No nose bleeds Pulm:  Cough with tenacious sputum. Shortness of breath periodically. CV: Orthopnea.  Shortness of breath. No palpitations, no LE edema.  GU:  No hematuria, no frequency GI:  Per HPI Heme:  Bruises easily. Hasn't had any excessive or unusual bleeding.   Transfusions:  Per HPI Neuro:  No headaches, no peripheral tingling or numbness Derm:  No itching, no rash or sores.  Endocrine:  No sweats or chills.  No polyuria or dysuria Immunization:  Not queried Travel:  None beyond local counties in last few months.    PHYSICAL  EXAM: Vital signs in last 24 hours: Vitals:   09/30/16 2037 10/01/16 0517  BP: (!) 117/47 (!) 103/34  Pulse: (!) 110 (!) 102  Resp: 18 18  Temp: 98.2 F (36.8 C) 97.7 F (36.5 C)  SpO2: 100% 100%   Wt Readings from Last 3 Encounters:  10/01/16 69.2 kg (152 lb 8 oz)  09/28/2016 72.1 kg (159 lb)  08/21/16 72.4 kg (159 lb 9.8 oz)    General: Pale, chronically ill-appearing WF with flat affect. Speech is a bit mumbled and hard to understand Head:  No facial asymmetry or swelling. No signs of head trauma.  Eyes:  No scleral icterus. Slight conjunctival pallor. Ears:  No gross hearing deficit.  Nose:  No congestion or discharge Mouth:  Oral mucosa slightly dry, tongue midline. No lesions or exudates. Neck:  No thyromegaly. No JVD. Lungs: Tight cough with tenacious, purulent slightly pink tinged sputum. Distant rales, coarse breath sounds.  Heart: Tachycardic, regular rhythm. S1, S2 audible. No MRG. Abdomen:  Soft. Bowel sounds active. Tenderness in the left lower quadrant primarily but also in the right upper quadrant. No guarding or rebound. No HSM.   Rectal: Stool is liquid, light brown, trace FOBT+   Musc/Skeltl: No joint erythema, swelling or gross deformity Extremities:  Slight edema in the legs, feet and into the thighs and torso  Neurologic:  Moves all 4 limbs. No tremor. Limb strength not tested. Alert.  Oriented times 3. Skin: Sacrum covered with a pressure dressing, not removed for exam. No suspicious lesions or obvious sores. Heme:  Multiple purpura on the upper chest, thorax, arms.  No significant bruising seen on the lower torso or legs. Nodes:  No cervical or inguinal adenopathy.   Psych:  Affect flat. Calm, not  agitated  Intake/Output from previous day: 08/29 0701 - 08/30 0700 In: 600 [P.O.:600] Out: 1326 [Urine:1325; Stool:1] Intake/Output this shift: Total I/O In: 237 [P.O.:237] Out: 400 [Urine:400]  LAB RESULTS:  Recent Labs  09/29/16 0607 09/30/16 0911  10/01/16 0424  WBC 8.1 6.6 5.5  HGB 7.8* 7.4* 6.9*  HCT 25.0* 23.4* 21.9*  PLT 95* 94* 99*   BMET Lab Results  Component Value Date   NA 137 10/01/2016   NA 137 09/30/2016   NA 139 09/29/2016   K 2.9 (L) 10/01/2016   K 3.4 (L) 09/30/2016   K 3.6 09/29/2016   CL 105 10/01/2016   CL 105 09/30/2016   CL 107 09/29/2016   CO2 26 10/01/2016   CO2 25 09/30/2016   CO2 25 09/29/2016   GLUCOSE 90 10/01/2016   GLUCOSE 95 09/30/2016   GLUCOSE 127 (H) 09/29/2016   BUN 12 10/01/2016   BUN 15 09/30/2016   BUN 15 09/29/2016   CREATININE 0.88 10/01/2016   CREATININE 0.89 09/30/2016   CREATININE 0.86 09/29/2016   CALCIUM 7.0 (L) 10/01/2016   CALCIUM 7.1 (L) 09/30/2016   CALCIUM 6.9 (L) 09/29/2016   LFT  Recent Labs  09/29/16 0607 09/30/16 0911  PROT 4.5* 4.5*  ALBUMIN 1.7* 1.5*  AST 23 20  ALT 13* 14  ALKPHOS 660* 489*  BILITOT 1.0 0.9   PT/INR Lab Results  Component Value Date   INR 1.29 09/10/2016   INR 1.24 08/06/2016   INR 1.08 07/29/2016   Hepatitis Panel No results for input(s): HEPBSAG, HCVAB, HEPAIGM, HEPBIGM in the last 72 hours. C-Diff No components found for: CDIFF Lipase     Component Value Date/Time   LIPASE 14 09/23/2016 1139    Drugs of Abuse  No results found for: LABOPIA, COCAINSCRNUR, LABBENZ, AMPHETMU, THCU, LABBARB   RADIOLOGY STUDIES: Dg Chest Port 1 View  Result Date: 09/30/2016 CLINICAL DATA:  Shortness of breath. EXAM: PORTABLE CHEST 1 VIEW COMPARISON:  Radiographs 09/26/2016.  CT 09/27/2016. FINDINGS: 1033 hours. Feeding tube remains in place, tip not visualized. The heart size and mediastinal contours are stable status post thoracic aortic endograft repair. There is a persistent left pleural effusion and mild bibasilar atelectasis, although the overall pulmonary aeration has improved. There is no edema or pneumothorax. The bones appear unchanged. IMPRESSION: Overall improved pulmonary aeration with persistent bibasilar atelectasis and  small left pleural effusion. No new findings. Electronically Signed   By: Richardean Sale M.D.   On: 09/30/2016 10:51     IMPRESSION:   *  FOBT positive anemia.  Does not appear to be iron, B12, or folate deficient though she does have macrocytic indices.  Anemia is multifactorial. Given the fact that she has sustained left kidney infarct, wonder if she also sustained ischemic injury to the colon following her recent aortic dissection? This would explain the increase in her chronic abdominal pain (which has existed for years upon review of Epic) and the fecal occult blood.  06/10/2016 colonoscopy revealed adenomatous colon polyps, hemorrhoids and hypertrophic anal papilla. This was performed just 10 days prior to her presentation with acute aortic dissection. She was coagulopathic during previous admission on 07/19/16 but this has not been an issue during her current admission.   She has significant purpura in her upper body but no large hematomas.  It makes you wonder if she has some kind of underlying clotting disorder?  *  Nausea and vomiting. Acute on chronic. Has gastric emptying study confirmed diagnosis of gastroparesis dating  back several years.  Fortunately her symptoms are improving.  Due to prolonged QT, she is not a candidate for Reglan. meds include Marinol, Protonix.    *  Major depression, improving. meds include Vyvanse (lisdexamfetamine) an ADHD med, this can cause anorexia and N/V.  It was just started 2 days ago for vegetative depression sxs, so will leave it in place.    PLAN:     *  Supportive care.  ? EGD?   Azucena Freed  10/01/2016, 1:35 PM Pager: [phone]

## 2016-10-01 NOTE — Progress Notes (Signed)
Patient ID: Haley Graham, female   DOB: 12-01-1955, 61 y.o.   MRN: [id number]  This NP visited patient at the bedside as a follow up for palliative needs and emotional support..  Daughter at bedside.  Continued conversation regarding current medical situation.   Patient continues to be more engaged in her care and attempts to take po intake.  She is tolerating TF at 10 cc/hr. Will need to monitor for tolerance and titration.  Oral intake still only consists of the sips of soda and milkshakes.  Ongoing concern for new cardiac(CHF/EF 30-35%/Pulmonary edema), and anemia. Multiple co-morbid ites.  Tenuous situation.   Code status addressed multiple times- decision still remains for Full code but family understands patient would not want to remains "on machines" for more than a few days.  Patient is encouraged to participate in physical therapy and self active range of motion exercises.     Discussed in detail importance of mobility, cough and deep breathing to prevent overall risk of infection.  Questions and concerns addressed  Discussed case with Dr Carolin Sicks. I will not be in the hospital again until Monday, family and attending team to call team phone with needs or concerns.  [phone]  Time in  0930      Time out  1005  Time spent on the unit was 35 minutes  Greater than 50% of the time was spent in counseling and coordination of care  Wadie Lessen NP  Palliative Medicine Team Team Phone # [phone] Pager [phone]

## 2016-10-02 ENCOUNTER — Inpatient Hospital Stay (HOSPITAL_COMMUNITY): Payer: BLUE CROSS/BLUE SHIELD

## 2016-10-02 ENCOUNTER — Encounter (HOSPITAL_COMMUNITY): Admission: EM | Disposition: E | Payer: Self-pay | Source: Home / Self Care | Attending: Internal Medicine

## 2016-10-02 ENCOUNTER — Encounter: Payer: BLUE CROSS/BLUE SHIELD | Admitting: Vascular Surgery

## 2016-10-02 DIAGNOSIS — I509 Heart failure, unspecified: Secondary | ICD-10-CM

## 2016-10-02 DIAGNOSIS — Z7189 Other specified counseling: Secondary | ICD-10-CM

## 2016-10-02 DIAGNOSIS — R195 Other fecal abnormalities: Secondary | ICD-10-CM

## 2016-10-02 LAB — TYPE AND SCREEN
ABO/RH(D): O POS
Antibody Screen: NEGATIVE
UNIT DIVISION: 0
UNIT DIVISION: 0

## 2016-10-02 LAB — BASIC METABOLIC PANEL
ANION GAP: 7 (ref 5–15)
BUN: 11 mg/dL (ref 6–20)
CALCIUM: 7.2 mg/dL — AB (ref 8.9–10.3)
CO2: 24 mmol/L (ref 22–32)
CREATININE: 0.86 mg/dL (ref 0.44–1.00)
Chloride: 106 mmol/L (ref 101–111)
GLUCOSE: 89 mg/dL (ref 65–99)
Potassium: 3.8 mmol/L (ref 3.5–5.1)
Sodium: 137 mmol/L (ref 135–145)

## 2016-10-02 LAB — GLUCOSE, CAPILLARY
GLUCOSE-CAPILLARY: 138 mg/dL — AB (ref 65–99)
GLUCOSE-CAPILLARY: 96 mg/dL (ref 65–99)
GLUCOSE-CAPILLARY: 98 mg/dL (ref 65–99)
Glucose-Capillary: 101 mg/dL — ABNORMAL HIGH (ref 65–99)
Glucose-Capillary: 101 mg/dL — ABNORMAL HIGH (ref 65–99)
Glucose-Capillary: 122 mg/dL — ABNORMAL HIGH (ref 65–99)

## 2016-10-02 LAB — CBC
HCT: 34.4 % — ABNORMAL LOW (ref 36.0–46.0)
Hemoglobin: 10.8 g/dL — ABNORMAL LOW (ref 12.0–15.0)
MCH: 30.3 pg (ref 26.0–34.0)
MCHC: 31.4 g/dL (ref 30.0–36.0)
MCV: 96.6 fL (ref 78.0–100.0)
PLATELETS: 92 10*3/uL — AB (ref 150–400)
RBC: 3.56 MIL/uL — ABNORMAL LOW (ref 3.87–5.11)
RDW: 20.2 % — AB (ref 11.5–15.5)
WBC: 5.8 10*3/uL (ref 4.0–10.5)

## 2016-10-02 LAB — BPAM RBC
BLOOD PRODUCT EXPIRATION DATE: 201810012359
Blood Product Expiration Date: 201810012359
ISSUE DATE / TIME: 201808301738
ISSUE DATE / TIME: 201808301431
UNIT TYPE AND RH: 5100
Unit Type and Rh: 5100

## 2016-10-02 LAB — MAGNESIUM: Magnesium: 1.6 mg/dL — ABNORMAL LOW (ref 1.7–2.4)

## 2016-10-02 SURGERY — LEFT HEART CATH AND CORONARY ANGIOGRAPHY
Anesthesia: LOCAL

## 2016-10-02 MED ORDER — MAGNESIUM SULFATE 2 GM/50ML IV SOLN
2.0000 g | Freq: Once | INTRAVENOUS | Status: AC
  Administered 2016-10-02: 2 g via INTRAVENOUS
  Filled 2016-10-02: qty 50

## 2016-10-02 MED ORDER — METOPROLOL TARTRATE 25 MG PO TABS
25.0000 mg | ORAL_TABLET | Freq: Two times a day (BID) | ORAL | Status: DC
  Administered 2016-10-02 – 2016-10-12 (×19): 25 mg via ORAL
  Filled 2016-10-02 (×20): qty 1

## 2016-10-02 MED ORDER — FUROSEMIDE 40 MG PO TABS
40.0000 mg | ORAL_TABLET | Freq: Every day | ORAL | Status: DC
  Administered 2016-10-02 – 2016-10-04 (×3): 40 mg via ORAL
  Filled 2016-10-02 (×3): qty 1

## 2016-10-02 NOTE — Progress Notes (Signed)
Daily Rounding Note  09/06/2016, 10:37 AM  LOS: 23 days   SUBJECTIVE:   Chief complaint: nausea.  This is better but not resolved   Some belly discomfort.  Breathing is better Consuming ~ 24% of soft diet trays, drinking Ensure TID and TF at 10 ml/hour.  Staff had to resume TF a few days ago as pt would not eat, this is better now on new meds.    OBJECTIVE:         Vital signs in last 24 hours:    Temp:  [97.1 F (36.2 C)-98.5 F (36.9 C)] 97.7 F (36.5 C) (08/31 0413) Pulse Rate:  [94-111] 109 (08/31 0413) Resp:  [12-20] 18 (08/31 0413) BP: (79-111)/(44-71) 99/55 (08/31 0413) SpO2:  [97 %-100 %] 97 % (08/31 0413) Weight:  [68 kg (150 lb)] 68 kg (150 lb) (08/31 0413) Last BM Date: 09/30/16 Filed Weights   09/30/16 0353 10/01/16 0517 09/04/2016 0413  Weight: 70.1 kg (154 lb 9.6 oz) 69.2 kg (152 lb 8 oz) 68 kg (150 lb)   General: ill looking   Heart: tachy, regular Chest: faint rales, overall course BS.   Abdomen: soft, BS present.  Mild, non-focal tenderness on left.    Extremities: + trace LE edema Neuro/Psych:  Alert, appropriate, calm.  Follows commands.  Moves all 4s.  No tremor.    Intake/Output from previous day: 08/30 0701 - 08/31 0700 In: 1068 [P.O.:597; NG/GT:471] Out: 1600 [Urine:1600]  Intake/Output this shift: No intake/output data recorded.  Lab Results:  Recent Labs  09/30/16 0911 10/01/16 0424 09/21/2016 0502  WBC 6.6 5.5 5.8  HGB 7.4* 6.9* 10.8*  HCT 23.4* 21.9* 34.4*  PLT 94* 99* 92*   BMET  Recent Labs  09/30/16 0911 10/01/16 0424 09/11/2016 0352  NA 137 137 137  K 3.4* 2.9* 3.8  CL 105 105 106  CO2 25 26 24   GLUCOSE 95 90 89  BUN 15 12 11   CREATININE 0.89 0.88 0.86  CALCIUM 7.1* 7.0* 7.2*   LFT  Recent Labs  09/30/16 0911  PROT 4.5*  ALBUMIN 1.5*  AST 20  ALT 14  ALKPHOS 489*  BILITOT 0.9   PT/INR No results for input(s): LABPROT, INR in the last 72  hours. Hepatitis Panel No results for input(s): HEPBSAG, HCVAB, HEPAIGM, HEPBIGM in the last 72 hours.  Studies/Results: Dg Chest Port 1 View  Result Date: 09/30/2016 CLINICAL DATA:  Shortness of breath. EXAM: PORTABLE CHEST 1 VIEW COMPARISON:  Radiographs 09/26/2016.  CT 09/27/2016. FINDINGS: 1033 hours. Feeding tube remains in place, tip not visualized. The heart size and mediastinal contours are stable status post thoracic aortic endograft repair. There is a persistent left pleural effusion and mild bibasilar atelectasis, although the overall pulmonary aeration has improved. There is no edema or pneumothorax. The bones appear unchanged. IMPRESSION: Overall improved pulmonary aeration with persistent bibasilar atelectasis and small left pleural effusion. No new findings. Electronically Signed   By: Richardean Sale M.D.   On: 09/30/2016 10:51   Scheduled Meds: . ALPRAZolam  0.25 mg Oral QHS  . aspirin EC  81 mg Oral Daily  . dronabinol  5 mg Oral BID AC  . DULoxetine  60 mg Oral Daily  . feeding supplement (ENSURE ENLIVE)  237 mL Oral TID BM  . feeding supplement (PRO-STAT SUGAR FREE 64)  30 mL Oral TID WC  . feeding supplement (VITAL 1.5 CAL)  1,000 mL Per Tube Q24H  . fluconazole  400 mg Oral Daily  . furosemide  40 mg Intravenous Q12H  . insulin aspart  0-5 Units Subcutaneous QHS  . insulin aspart  0-9 Units Subcutaneous TID WC  . lisdexamfetamine  20 mg Oral Daily  . metoprolol tartrate  12.5 mg Oral BID  . ondansetron (ZOFRAN) IV  4 mg Intravenous Q6H  . pantoprazole  40 mg Oral QHS  . potassium chloride  40 mEq Oral BID  . sodium chloride flush  3 mL Intravenous Q12H   Continuous Infusions: . magnesium sulfate 1 - 4 g bolus IVPB     PRN Meds:.acetaminophen **OR** acetaminophen, alum & mag hydroxide-simeth, fluticasone, ipratropium-albuterol, lidocaine, loperamide, menthol-cetylpyridinium, morphine injection, senna-docusate, sodium phosphate, traMADol   ASSESMENT:   *  FOBT+  anemia.  Anemia is multifactorial.  Improved, good response to PRBCs x 2 on 8/30.   Suspect ischemic bowel injury following aortic dissection and stent graft repair 6/12. Adenomatous colon polyps, hemorrhoids, hypertrophic anal papilla on 06/10/16 colonoscopy performed 10 days prior to her aortic dissection  *  Malnutrition.  On Trickle tube feeds of Vital 1.5, also Ensure can TID and ~ 25% of soft trays.    *   N/V.  Acute on chronic in patient with [place] documented gastroparesis and IBS.   Scheduled Zofran added 8/30. Also on Marinol, Protonix.  Overall Sxs improved and taking more PO nutrition.   Reglan being avoided due to prolonged QT syndrome.  *  Major depression. On multiple new meds.  *  CHF. Cardiac cath planned for 8/30 was canceled due to worsening anemia, FOBT positive stool.   PLAN   *  No plans for endoscopy.  Continue current med regimen, follow CBC.    *  Defer mgt of nutrition to RD, but since she is drinking the Ensure TID and taking a little of the soft diet, consider stopping tube feeds.      Haley Graham  09/23/2016, 10:37 AM Pager: [phone]

## 2016-10-02 NOTE — Progress Notes (Signed)
Went to patient's room to talk to patient's daughter however she just left. I called her [facility] [place]) cell no twice with no answer therefore left message.   As I was talking to the patient, she stated that she doesn't want her daughter to make her clinical decision. She wants her husband to make decision for her. She is okay to call her daughter and give update about her condition. Pt's RN was at bedside.

## 2016-10-02 NOTE — Progress Notes (Signed)
Page to Dr Carolin Sicks informing of daughters request to relocate pt off of [place]; and requesting a call/to speak to doctor.    Per patient's daughter, MD may call her cell phone, listed in chart under [facility] [place]

## 2016-10-02 NOTE — Progress Notes (Signed)
Progress Note  Patient Name: Haley Graham Date of Encounter: 09/17/2016  Primary Cardiologist: New  Subjective   Dyspnea is much better. No orthopnea. Lots of nausea. More alert. Hgb 10.8 after transfusion. QTC down to 482 ms on yesterday ECG (not yet done today).  Inpatient Medications    Scheduled Meds: . ALPRAZolam  0.25 mg Oral QHS  . aspirin EC  81 mg Oral Daily  . dronabinol  5 mg Oral BID AC  . DULoxetine  60 mg Oral Daily  . feeding supplement (ENSURE ENLIVE)  237 mL Oral TID BM  . feeding supplement (PRO-STAT SUGAR FREE 64)  30 mL Oral TID WC  . feeding supplement (VITAL 1.5 CAL)  1,000 mL Per Tube Q24H  . fluconazole  400 mg Oral Daily  . furosemide  40 mg Intravenous Q12H  . insulin aspart  0-5 Units Subcutaneous QHS  . insulin aspart  0-9 Units Subcutaneous TID WC  . lisdexamfetamine  20 mg Oral Daily  . metoprolol tartrate  12.5 mg Oral BID  . ondansetron (ZOFRAN) IV  4 mg Intravenous Q6H  . pantoprazole  40 mg Oral QHS  . potassium chloride  40 mEq Oral BID  . sodium chloride flush  3 mL Intravenous Q12H   Continuous Infusions: . magnesium sulfate 1 - 4 g bolus IVPB     PRN Meds: acetaminophen **OR** acetaminophen, alum & mag hydroxide-simeth, fluticasone, ipratropium-albuterol, lidocaine, loperamide, menthol-cetylpyridinium, morphine injection, senna-docusate, sodium phosphate, traMADol   Vital Signs    Vitals:   09/12/2016 0044 09/07/2016 0350 09/21/2016 0413 09/09/2016 1219  BP: 111/60 100/71 (!) 99/55 121/69  Pulse: (!) 111 (!) 102 (!) 109 (!) 111  Resp: 18 18 18    Temp: (!) 97.5 F (36.4 C) 98.5 F (36.9 C) 97.7 F (36.5 C)   TempSrc: Oral Oral Oral   SpO2: 100% 98% 97%   Weight:   150 lb (68 kg)   Height:        Intake/Output Summary (Last 24 hours) at 09/21/2016 1226 Last data filed at 09/03/2016 0600  Gross per 24 hour  Intake              831 ml  Output             1200 ml  Net             -369 ml   Filed Weights   09/30/16 0353  10/01/16 0517 09/22/2016 0413  Weight: 154 lb 9.6 oz (70.1 kg) 152 lb 8 oz (69.2 kg) 150 lb (68 kg)    Telemetry    NSR - Personally Reviewed  ECG    NSR, diffuse T wave inversion and moderately prolonged QT - Personally Reviewed  Physical Exam  Appears a little more alert than yesterday, no acute distress, not dyspneic lying fully flat in bed GEN: No acute distress.   Neck: No JVD Cardiac: RRR, no murmurs, rubs, or gallops.  Respiratory: Clear to auscultation bilaterally. GI: Soft, nontender, non-distended  MS: No edema; No deformity. Neuro:  Nonfocal  Psych: Normal affect   Labs    Chemistry Recent Labs Lab 09/28/16 0731 09/29/16 0607 09/30/16 0911 10/01/16 0424 09/09/2016 0352  NA 140 139 137 137 137  K 4.1 3.6 3.4* 2.9* 3.8  CL 108 107 105 105 106  CO2 23 25 25 26 24   GLUCOSE 138* 127* 95 90 89  BUN 15 15 15 12 11   CREATININE 0.89 0.86 0.89 0.88 0.86  CALCIUM 7.6*  6.9* 7.1* 7.0* 7.2*  PROT 4.9* 4.5* 4.5*  --   --   ALBUMIN 1.9* 1.7* 1.5*  --   --   AST 25 23 20   --   --   ALT 13* 13* 14  --   --   ALKPHOS 756* 660* 489*  --   --   BILITOT 1.2 1.0 0.9  --   --   GFRNONAA >60 >60 >60 >60 >60  GFRAA >60 >60 >60 >60 >60  ANIONGAP 9 7 7 6 7      Hematology Recent Labs Lab 09/30/16 0911 10/01/16 0424 09/23/2016 0502  WBC 6.6 5.5 5.8  RBC 2.32* 2.16* 3.56*  HGB 7.4* 6.9* 10.8*  HCT 23.4* 21.9* 34.4*  MCV 100.9* 101.4* 96.6  MCH 31.9 31.9 30.3  MCHC 31.6 31.5 31.4  RDW 23.5* 23.5* 20.2*  PLT 94* 99* 92*    Cardiac EnzymesNo results for input(s): TROPONINI in the last 168 hours. No results for input(s): TROPIPOC in the last 168 hours.   BNP Recent Labs Lab 09/28/16 0731  BNP 1,093.3*     DDimer No results for input(s): DDIMER in the last 168 hours.   Radiology    Dg Abd Portable 1v  Result Date: 09/20/2016 CLINICAL DATA:  Left lower quadrant pain. EXAM: PORTABLE ABDOMEN - 1 VIEW COMPARISON:  09/25/2016. FINDINGS: Thoracic aortic stent noted.  Feeding tube noted with its tip over the duodenum. Distended loops of small and large bowel are noted suggesting adynamic ileus. No free air. IMPRESSION: Feeding tube noted with tip projected over the duodenum. Distended loops of small large bowel are noted suggesting adynamic ileus . Electronically Signed   By: Marcello Moores  Register   On: 09/13/2016 10:59    Cardiac Studies   Study Conclusions 09/29/16  - Left ventricle: The cavity size was normal. Wall thickness was normal. Systolic function was moderately to severely reduced. The estimated ejection fraction was in the range of 30% to 35%. There is akinesis of the mid-apicalanteroseptal and anterior myocardium. Doppler parameters are consistent with abnormal left ventricular relaxation (grade 1 diastolic dysfunction). - Aortic valve: Trileaflet; mildly thickened, mildly calcified leaflets. - Mitral valve: Calcified annulus. There was mild regurgitation.  Impressions:  - EF is reduced when compared to prior study  Patient Profile     61 y.o. 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 Heart failure symptoms resolved promptly with diuretic therapy.  Assessment & Plan    1. CHF/Cardiomyopathy: EF was normal just 2 weeks ago. She has not had angina pectoris but has regional wall motion abnormalities roughly matching LAD artery distribution. The ECG shows broad anterior T-wave inversions and prolonged QT interval, now improving.  Differential  diagnosis includes coronary insufficiency due to CAD, proximal extension of her dissection with involvement of the left coronary artery (not seen on CT 8/26) and takotsubo syndrome.  Coronary CT angiography considered, but unlikely to get any useful information due to relative tachycardia (heart rate around 100-110). Plan for invasive evaluation of coronary arteries delayed due to worsening anemia and evidence of GI bleeding with Hemoccult-positive stool. No  significant calcification the coronary arteries on chest CT, although this is not entirely reassuring for the absence of significant LAD stenosis. The stent in the thoracic aorta extends proximally to the ostium of the innominate artery. We had discussions with Dr. Trula Slade and Dr. Donzetta Matters: They suspect there would be a low risk of disruption of the aortic stent during diagnostic catheterization from the right radial approach. Gradual worsening of QT interval and subsequent gradual improvement is consistent with either LAD ischemia or stress cardiomyopathy. At this point, the plan is to repeat an echocardiogram early next week. If left ventricular systolic function remains severely depressed, I think she should undergo coronary angiography as long as the anemia does not appear to be related to acute bleeding. If the echocardiogram next week shows normalization of left ventricular systolic function, it is possible that she had transient LAD ischemia or takotsubo syndrome. Under those circumstances, a Lexiscan Myoview would suffice in my opinion to exclude LAD disease. That way we can avoid the risks of invasive manipulation of the aorta.  2. Ao dissection: Chest CT August 26 does not show dissection the proximal ascending aorta. Even if this occurred, I do not think she would be a candidate for surgical repair of a proximal aortic dissection because of numerous comorbid conditions.  No evidence of enlargement of the aortic root or disruption of the aortic  valve on transthoracic echo.  Note that the covered portion of the aortic stent graft now overlaps the ostium of the left subclavian artery. (There has been some degree of stent migration, probably?). The patient's blood pressure should be checked only in the right upper extremity.   Beta blockers are important to prevent further aortic pathology. They should not be withheld unless systolic blood pressures less than 90. Blood pressure should be checked only in the right upper extremity.  3. Anemia: Macrocytic, hyporegenerative, no evidence of iron or vitamin deficiency, associated with thrombocytopenia. Consider bone marrow insufficiency and recommend hematology consultation.  4. Hemoccult-positive stools: While GI bleeding may be contributing to the anemia, I don't think it's the only reason for it. Mesenteric ischemia from the aortic dissection may be causing this. GI recommendations are to use proton pump inhibitors.   5. QT prolongation: Could be explained either by coronary insufficiency or by stress cardiomyopathy. Yesterday patient was also hypokalemic, now resolved. Avoid agents that can prolong the QT interval further. This includes Reglan which was suggested by gastroenterology and Abilify which has been stopped by psychiatry. Suspect the QT interval may return to normal as early as tomorrow.  Very complex patient, with multiple severe and life-threatening conditions. Prognosis is very guarded.  Signed, Sanda Klein, MD  09/16/2016, 12:26 PM

## 2016-10-02 NOTE — Plan of Care (Signed)
Problem: Nutrition: Goal: Adequate nutrition will be maintained Outcome: Not Progressing Patient continues to show disinterest to po intake.

## 2016-10-02 NOTE — Progress Notes (Signed)
Patient was transferred to 4E23 @ 2030. No acute distress noted. Report given to 4E RN.

## 2016-10-02 NOTE — Progress Notes (Signed)
Upon removal of right forearm IV today, pts right arm developed a skin tear/breakdown below the extension tubing, not visible with IV access in place.

## 2016-10-02 NOTE — Progress Notes (Signed)
Pt requests her daughter not be allowed back in her room; pt's husband (daughters father) at bedside, stating "thats not right, she helps me understand things".   Pt informed that she and her husband could discuss this concern, but that it was apparent her daughter only wants what is best for her.

## 2016-10-02 NOTE — Progress Notes (Addendum)
Per Bhandari,MD pt needs to maintain foley catheter, as she is very ill and needs most accurate in/out.   Dr Carolin Sicks also sates he is ok to transfer pt to stepdown if her demand begins to exceed 3east acuity/ratios

## 2016-10-02 NOTE — Progress Notes (Signed)
Patient's daughter is at bedside demanding answers/calls from multiple MDs/Departments.   Pt expresses concern regarding pts location on [place], expresses desire for transfer to stepdown.   Pt currently presents as A/Ox4 and stable. Pt is notably week, but symptomatically anemic yesterday prior to PRBC infusion. Pt able to vocalize concerns when alone in the room, does not speak much when daughter is at bedside.    Pt's daughter presents pleasant to staff, but family is heard insulting staff and displeasure with care as staff enters the room.

## 2016-10-02 NOTE — Progress Notes (Signed)
Patient transferred from [facility]. Alert and oriented. Family at bedside. Oriented to room and surroundings.CCMD called and verified.

## 2016-10-02 NOTE — Progress Notes (Signed)
Physical Therapy Treatment Patient Details Name: Haley Graham MRN: [id number] DOB: 02-15-1955 Today's Date: 09/06/2016    History of Present Illness This 61 y.o. female admitted with sepsis, hypotension, dehydration, amemia, COPD, AKI due to poor oral intake and hypotension/dehydration.  PMH  includes:  Recent prolonged hospitalization for [facility] type B aortic dissection s/p descending thoracic aortic stent graft placement; anxiety, GERD, COPD,     PT Comments    Pt required mod/max A +2 for bed mobility and OOB transfers X5. Pt continues to demonstrate increased desire to mobilize and was able to control breathing much better today with minimal cues. Continue to progress as tolerated.    Follow Up Recommendations  SNF     Equipment Recommendations  None recommended by PT    Recommendations for Other Services       Precautions / Restrictions Precautions Precautions: Fall Precaution Comments: cortrak Restrictions Weight Bearing Restrictions: No    Mobility  Bed Mobility Overal bed mobility: Needs Assistance Bed Mobility: Supine to Sit;Sit to Supine     Supine to sit: Mod assist;+2 for physical assistance Sit to supine: Mod assist;+2 for physical assistance   General bed mobility comments: cues for sequencing and assist at bilat LE and trunk  Transfers Overall transfer level: Needs assistance Equipment used: 2 person hand held assist Transfers: Sit to/from Stand;Stand Pivot Transfers Sit to Stand: Max assist;+2 physical assistance Stand pivot transfers: Max assist;+2 physical assistance       General transfer comment: pt stood from EOB, BSC, and then again to position up toward [facility] prior to returning to supine; cues for hand placement and technqiue; assist to power up and to pivot with bilat LE blocked and facilitation at glutes  Ambulation/Gait             General Gait Details: pt took a 3 sidesteps to [facility] with max A +2    Stairs             Wheelchair Mobility    Modified Rankin (Stroke Patients Only)       Balance Overall balance assessment: Needs assistance Sitting-balance support: Bilateral upper extremity supported;Feet supported Sitting balance-Leahy Scale: Poor Sitting balance - Comments: Using Bil UE for support   Standing balance support: Bilateral upper extremity supported;During functional activity Standing balance-Leahy Scale: Poor Standing balance comment: stood for brief periods for back peri care                            Cognition Arousal/Alertness: Awake/alert Behavior During Therapy: Flat affect;WFL for tasks assessed/performed Overall Cognitive Status: Impaired/Different from baseline Area of Impairment: Following commands;Safety/judgement;Problem solving                       Following Commands: Follows one step commands consistently Safety/Judgement: Decreased awareness of safety   Problem Solving: Slow processing;Requires verbal cues;Requires tactile cues General Comments: pt was able to control breathing much better today and with minimal cues      Exercises      General Comments        Pertinent Vitals/Pain Pain Assessment: 0-10 Pain Score: 8  Pain Location: abdomen Pain Descriptors / Indicators: Cramping Pain Intervention(s): Limited activity within patient's tolerance;Monitored during session;Repositioned;Patient requesting pain meds-RN notified    Home Living                      Prior Function  PT Goals (current goals can now be found in the care plan section) Acute Rehab PT Goals PT Goal Formulation: With patient Time For Goal Achievement: 09/18/2016 Potential to Achieve Goals: Fair Progress towards PT goals: Progressing toward goals    Frequency    Min 2X/week      PT Plan Current plan remains appropriate    Co-evaluation PT/OT/SLP Co-Evaluation/Treatment: Yes Reason for Co-Treatment: Complexity of the patient's  impairments (multi-system involvement);For patient/therapist safety;To address functional/ADL transfers PT goals addressed during session: Mobility/safety with mobility OT goals addressed during session: ADL's and self-care;Strengthening/ROM      AM-PAC PT "6 Clicks" Daily Activity  Outcome Measure  Difficulty turning over in bed (including adjusting bedclothes, sheets and blankets)?: Unable Difficulty moving from lying on back to sitting on the side of the bed? : Unable Difficulty sitting down on and standing up from a chair with arms (e.g., wheelchair, bedside commode, etc,.)?: Unable Help needed moving to and from a bed to chair (including a wheelchair)?: A Lot Help needed walking in hospital room?: Total Help needed climbing 3-5 steps with a railing? : Total 6 Click Score: 7    End of Session Equipment Utilized During Treatment: Gait belt;Oxygen Activity Tolerance: Patient tolerated treatment well Patient left: in bed;with call bell/phone within reach;with bed alarm set Nurse Communication: Mobility status PT Visit Diagnosis: Muscle weakness (generalized) (M62.81);Other abnormalities of gait and mobility (R26.89);Unsteadiness on feet (R26.81);Adult, failure to thrive (R62.7)     Time: 1450-1520 PT Time Calculation (min) (ACUTE ONLY): 30 min  Charges:  $Therapeutic Activity: 8-22 mins                    G Codes:       Earney Navy, PTA Pager: [phone]     Darliss Cheney 09/04/2016, 4:45 PM

## 2016-10-02 NOTE — Progress Notes (Addendum)
PROGRESS NOTE    Haley Graham  DVV:[id number] DOB: [birth year] DOA: 09/11/2016 PCP: Glenda Chroman, MD   Brief Narrative: 61 y.o.femalewith medical history significant of [place] type B aortic dissection status post descending thoracic aortic stent graft placement per vascular surgery during last hospitalization from 06/20/2016-08/22/2016, a history of diabetes mellitus with gastroparesis, gastroesophageal reflux disease, hypertension, IBS, abdominal aortic aneurysm, anxiety, depression presented with generalized weakness and sepsis on admission. The blood culture grwing is to therefore started on fluconazole for candidemia. Evaluated by ID. ID recommended continuing fluconazole 400 mg daily through 9/26. Patient now with congestive heart failure, hypoxia and echo showed reduced EF of 30-35% with akinesis of of mid-apicalanteroseptal and anterior myocardium.  Assessment & Plan:  # Sepsis due to candidemia: -Continue fluconazole until September 26 and needs follow-up with ophthalmologist in a month as per infectious disease.  #Acute systolic congestive heart failure: Currently on oral Lasix. - Cardiology consult appreciated. Monitor BMP, urine output.  -As per cardiology, plan to repeat echocardiogram early next week. If EF is still low then proceed with coronary angiogram. If EF has normalized then perform noninvasive coronary evaluation with either CT angiogram or Lexiscan Myoview. -Evaluated by palliative care, patient is full code.  #Prolonged QTC: Discontinue Azithromycin and abilify. Monitor in telemetry.  #Severe protein calorie malnutrition: Patient is on NG tube because of decreased oral intake and severe malnutrition. Patient is still with decreased oral intake. I discussed with the GI today. Plan to continue NG tube feeding seems like she is tolerating tube feedings well.  #Acute kidney injury resolved  #Acute on Chronic anemia likely due to chronic disease vs acute GI bleed:  Iron stores acceptable. Fecal occult blood test positive. On Protonix. Status post 2 unit of red blood cell transfusion with improvement in hemoglobin. Evaluated by GI. No further endoscopy at this time.   #Nausea and abdominal discomfort: Abdominal x-ray consistent with adynamic ileus. I discussed with the GI today regarding the plan. Continue Zofran IV as needed. Discontinue IV morphine. Monitor electrolytes. Given persistent tachycardia, now new finding if ileus, I will transfer the patient to step down unit for close monitoring.   #Thrombocytopenia likely due to acute illness: No sign of bleeding. Monitor CBC. Platelet count 92  #Anxiety depression: Evaluated by psychiatrist. -Continue Xanax, Cymbalta and Vyvanse per psychiatrist  # hypokalemia/hypomagnesemia: Serum potassium level acceptable. Repleted magnesium IV and continue potassium chloride. Monitor BMP.   DVT prophylaxis: SCD. Patient with anemia and thrombocytopenia Code Status: Full code Family Communication: discussed with patient's nurse. Disposition Plan: Currently admitted, likely discharge to skilled facility in 3-4 days    Consultants:   Infectious disease  Psychiatrist  Palliative care  Cardiologist  GI  Procedures:  Thoracentesis 8/17  Echo 8/28 EF 30-35%, akinesis of mid-apicalanteroseptal and anterior myocardium.  Diastolic dysfunction.  Echo 8/13 EF 62-69, diastolic dysfunction, lipomatous hypertrophy Antimicrobials: Fluconazole Subjective: Seen and examined at bedside. Complaining of nausea and abdominal discomfort. Denied headache, dizziness, chest pain or shortness of breath. Slept well last night.  Objective: Vitals:   09/07/2016 0044 09/21/2016 0350 10/01/2016 0413 09/17/2016 1219  BP: 111/60 100/71 (!) 99/55 121/69  Pulse: (!) 111 (!) 102 (!) 109 (!) 111  Resp: 18 18 18    Temp: (!) 97.5 F (36.4 C) 98.5 F (36.9 C) 97.7 F (36.5 C)   TempSrc: Oral Oral Oral   SpO2: 100% 98% 97%   Weight:    68 kg (150 lb)   Height:  Intake/Output Summary (Last 24 hours) at 09/16/2016 1307 Last data filed at 09/27/2016 0600  Gross per 24 hour  Intake              711 ml  Output             1200 ml  Net             -489 ml   Filed Weights   09/30/16 0353 10/01/16 0517 09/16/2016 0413  Weight: 70.1 kg (154 lb 9.6 oz) 69.2 kg (152 lb 8 oz) 68 kg (150 lb)    Examination:  General exam: Lying on bed comfortable, has NG tube Respiratory system: Bibasal degrees does sound, as per report normal Cardiovascular system: Regular rate rhythm, S1 is normal. Bilateral lower extremities edema. Gastrointestinal system: Abdomen soft, nontender, bowel sound positive. Central nervous system: Alert and oriented. No focal neurological deficits. Extremities: Symmetric 5 x 5 power. Skin: No rashes, lesions or ulcers Psychiatry: Judgement and insight appear normal. Mood & affect appropriate.     Data Reviewed: I have personally reviewed following labs and imaging studies  CBC:  Recent Labs Lab 09/28/16 0731 09/29/16 0607 09/30/16 0911 10/01/16 0424 09/27/2016 0502  WBC 9.5 8.1 6.6 5.5 5.8  HGB 8.2* 7.8* 7.4* 6.9* 10.8*  HCT 26.2* 25.0* 23.4* 21.9* 34.4*  MCV 100.8* 102.9* 100.9* 101.4* 96.6  PLT 100* 95* 94* 99* 92*   Basic Metabolic Panel:  Recent Labs Lab 09/27/16 0610 09/28/16 0731 09/29/16 0607 09/30/16 0911 10/01/16 0424 09/30/2016 0352  NA 138 140 139 137 137 137  K 3.5 4.1 3.6 3.4* 2.9* 3.8  CL 107 108 107 105 105 106  CO2 24 23 25 25 26 24   GLUCOSE 133* 138* 127* 95 90 89  BUN 12 15 15 15 12 11   CREATININE 0.87 0.89 0.86 0.89 0.88 0.86  CALCIUM 7.9* 7.6* 6.9* 7.1* 7.0* 7.2*  MG 1.6* 1.7 1.5* 1.8 1.3* 1.6*  PHOS 3.3 4.0 3.8 3.2 3.2  --    GFR: Estimated Creatinine Clearance: 64.4 mL/min (by C-G formula based on SCr of 0.86 mg/dL). Liver Function Tests:  Recent Labs Lab 09/26/16 0451 09/27/16 0610 09/28/16 0731 09/29/16 0607 09/30/16 0911  AST 17 21 25 23 20   ALT  11* 11* 13* 13* 14  ALKPHOS 329* 488* 756* 660* 489*  BILITOT 0.7 1.0 1.2 1.0 0.9  PROT 4.4* 5.2* 4.9* 4.5* 4.5*  ALBUMIN 1.7* 2.1* 1.9* 1.7* 1.5*   No results for input(s): LIPASE, AMYLASE in the last 168 hours. No results for input(s): AMMONIA in the last 168 hours. Coagulation Profile: No results for input(s): INR, PROTIME in the last 168 hours. Cardiac Enzymes: No results for input(s): CKTOTAL, CKMB, CKMBINDEX, TROPONINI in the last 168 hours. BNP (last 3 results) No results for input(s): PROBNP in the last 8760 hours. HbA1C: No results for input(s): HGBA1C in the last 72 hours. CBG:  Recent Labs Lab 10/01/16 1626 10/01/16 2213 09/06/2016 0009 09/10/2016 0746 09/23/2016 1216  GLUCAP 101* 98 101* 101* 122*   Lipid Profile: No results for input(s): CHOL, HDL, LDLCALC, TRIG, CHOLHDL, LDLDIRECT in the last 72 hours. Thyroid Function Tests: No results for input(s): TSH, T4TOTAL, FREET4, T3FREE, THYROIDAB in the last 72 hours. Anemia Panel: No results for input(s): VITAMINB12, FOLATE, FERRITIN, TIBC, IRON, RETICCTPCT in the last 72 hours. Sepsis Labs: No results for input(s): PROCALCITON, LATICACIDVEN in the last 168 hours.  No results found for this or any previous visit (from the past 240 hour(s)).  Radiology Studies: Dg Abd Portable 1v  Result Date: 09/28/2016 CLINICAL DATA:  Left lower quadrant pain. EXAM: PORTABLE ABDOMEN - 1 VIEW COMPARISON:  09/25/2016. FINDINGS: Thoracic aortic stent noted. Feeding tube noted with its tip over the duodenum. Distended loops of small and large bowel are noted suggesting adynamic ileus. No free air. IMPRESSION: Feeding tube noted with tip projected over the duodenum. Distended loops of small large bowel are noted suggesting adynamic ileus . Electronically Signed   By: Cal[place]oun   On: 09/02/2016 10:59        Scheduled Meds: . ALPRAZolam  0.25 mg Oral QHS  . aspirin EC  81 mg Oral Daily  . dronabinol  5 mg Oral BID AC  .  DULoxetine  60 mg Oral Daily  . feeding supplement (ENSURE ENLIVE)  237 mL Oral TID BM  . feeding supplement (PRO-STAT SUGAR FREE 64)  30 mL Oral TID WC  . feeding supplement (VITAL 1.5 CAL)  1,000 mL Per Tube Q24H  . fluconazole  400 mg Oral Daily  . furosemide  40 mg Oral Daily  . insulin aspart  0-5 Units Subcutaneous QHS  . insulin aspart  0-9 Units Subcutaneous TID WC  . lisdexamfetamine  20 mg Oral Daily  . metoprolol tartrate  25 mg Oral BID  . ondansetron (ZOFRAN) IV  4 mg Intravenous Q6H  . pantoprazole  40 mg Oral QHS  . potassium chloride  40 mEq Oral BID  . sodium chloride flush  3 mL Intravenous Q12H   Continuous Infusions: . magnesium sulfate 1 - 4 g bolus IVPB Stopped (09/16/2016 1227)     LOS: 23 days    Tara Rud Tanna Furry, MD [facility] Pager [phone]  If 7PM-7AM, please contact night-coverage [email or web address] Password [email or web address] 10/01/2016, 1:07 PM

## 2016-10-02 NOTE — Progress Notes (Signed)
Occupational Therapy Treatment Patient Details Name: Haley Graham MRN: [id number] DOB: 1955-05-02 Today's Date: 09/24/2016    History of present illness This 61 y.o. female admitted with sepsis, hypotension, dehydration, amemia, COPD, AKI due to poor oral intake and hypotension/dehydration.  PMH  includes:  Recent prolonged hospitalization for [place] type B aortic dissection s/p descending thoracic aortic stent graft placement; anxiety, GERD, COPD,    OT comments  This 61 yo female admitted with above presents to acute OT saying she was ready to work with [place] and wanted to walk, but first needed to get to [facility]. After getting to/from [facility] she said she did not have enough energy to try and walk. She did tolerate 5 sit>stands today. She will continue to benefit from acute OT with follow up OT at SNF.   Follow Up Recommendations  SNF    Equipment Recommendations  None recommended by OT       Precautions / Restrictions Precautions Precautions: Fall Precaution Comments: cortrak Restrictions Weight Bearing Restrictions: No       Mobility Bed Mobility Overal bed mobility: Needs Assistance Bed Mobility: Supine to Sit;Sit to Supine     Supine to sit: Mod assist;+2 for physical assistance Sit to supine: Mod assist;+2 for physical assistance      Transfers Overall transfer level: Needs assistance Equipment used: 2 person hand held assist Transfers: Sit to/from Stand;Stand Pivot Transfers Sit to Stand: Max assist;+2 physical assistance Stand pivot transfers: Max assist;+2 physical assistance       General transfer comment: Pt stood x 1 from bed to get to 3n1; x3 from 3n1 for peri care, x1 from bed again to scoot up in bed    Balance Overall balance assessment: Needs assistance Sitting-balance support: Bilateral upper extremity supported;Feet supported Sitting balance-Leahy Scale: Poor Sitting balance - Comments: Using Bil UE for support   Standing balance support: Bilateral  upper extremity supported;During functional activity Standing balance-Leahy Scale: Poor Standing balance comment: stood for brief periods for back peri care                           ADL either performed or assessed with clinical judgement   ADL Overall ADL's : Needs assistance/impaired                         Toilet Transfer: Maximal assistance;+2 for physical assistance;Stand-pivot;BSC (with one person A on each side)   Toileting- Clothing Manipulation and Hygiene: Total assistance Toileting - Clothing Manipulation Details (indicate cue type and reason): Max A for partial stand             Vision Patient Visual Report: No change from baseline            Cognition Arousal/Alertness: Awake/alert Behavior During Therapy: Flat affect;WFL for tasks assessed/performed Overall Cognitive Status: Impaired/Different from baseline Area of Impairment: Following commands;Safety/judgement;Problem solving                       Following Commands: Follows one step commands consistently Safety/Judgement: Decreased awareness of safety   Problem Solving: Slow processing;Requires verbal cues;Requires tactile cues General Comments: Pt with decreased anxiety today over last session and decreased increased audible WOB than last session                   Pertinent Vitals/ Pain       Pain Assessment: 0-10 Pain Score: 8  Pain Location:  abdomen Pain Descriptors / Indicators: Cramping Pain Intervention(s): Limited activity within patient's tolerance;Monitored during session;Repositioned;Patient requesting pain meds-RN notified (RN into check on pt about pain meds)         Frequency  Min 2X/week        Progress Toward Goals  OT Goals(current goals can now be found in the care plan section)  Progress towards OT goals: Progressing toward goals (able to tolerate more activity today)     Plan Discharge plan remains appropriate     Co-evaluation    PT/OT/SLP Co-Evaluation/Treatment: Yes Reason for Co-Treatment: For patient/therapist safety;To address functional/ADL transfers;Complexity of the patient's impairments (multi-system involvement)   OT goals addressed during session: ADL's and self-care;Strengthening/ROM      AM-PAC PT "6 Clicks" Daily Activity     Outcome Measure   Help from another person eating meals?: A Lot Help from another person taking care of personal grooming?: A Lot Help from another person toileting, which includes using toliet, bedpan, or urinal?: Total Help from another person bathing (including washing, rinsing, drying)?: Total Help from another person to put on and taking off regular upper body clothing?: Total Help from another person to put on and taking off regular lower body clothing?: Total 6 Click Score: 8    End of Session Equipment Utilized During Treatment: Gait belt  OT Visit Diagnosis: Unsteadiness on feet (R26.81);Muscle weakness (generalized) (M62.81);Pain Pain - part of body:  (abdomen)   Activity Tolerance Patient limited by fatigue   Patient Left in bed;with call bell/phone within reach;with bed alarm set   Nurse Communication Patient requests pain meds        Time: 1450-1526 OT Time Calculation (min): 36 min  Charges: OT General Charges $OT Visit: 1 Visit OT Treatments $Self Care/Home Management : 8-22 mins  [address] Circle, OTR/L [phone] 10/01/2016

## 2016-10-03 LAB — BASIC METABOLIC PANEL
ANION GAP: 10 (ref 5–15)
BUN: 10 mg/dL (ref 6–20)
CO2: 22 mmol/L (ref 22–32)
CREATININE: 0.79 mg/dL (ref 0.44–1.00)
Calcium: 7 mg/dL — ABNORMAL LOW (ref 8.9–10.3)
Chloride: 103 mmol/L (ref 101–111)
GFR calc Af Amer: >60 mL/min (ref >60)
Glucose, Bld: 76 mg/dL (ref 65–99)
Potassium: 4.5 mmol/L (ref 3.5–5.1)
Sodium: 135 mmol/L (ref 135–145)

## 2016-10-03 LAB — CBC
HCT: 29.8 % — ABNORMAL LOW (ref 36.0–46.0)
Hemoglobin: 9.7 g/dL — ABNORMAL LOW (ref 12.0–15.0)
MCH: 31.8 pg (ref 26.0–34.0)
MCHC: 32.6 g/dL (ref 30.0–36.0)
MCV: 97.7 fL (ref 78.0–100.0)
PLATELETS: 81 10*3/uL — AB (ref 150–400)
RBC: 3.05 MIL/uL — ABNORMAL LOW (ref 3.87–5.11)
RDW: 20.8 % — AB (ref 11.5–15.5)
WBC: 6 10*3/uL (ref 4.0–10.5)

## 2016-10-03 LAB — MAGNESIUM: Magnesium: 1.8 mg/dL (ref 1.7–2.4)

## 2016-10-03 LAB — GLUCOSE, CAPILLARY
GLUCOSE-CAPILLARY: 115 mg/dL — AB (ref 65–99)
GLUCOSE-CAPILLARY: 99 mg/dL (ref 65–99)
Glucose-Capillary: 112 mg/dL — ABNORMAL HIGH (ref 65–99)
Glucose-Capillary: 90 mg/dL (ref 65–99)
Glucose-Capillary: 93 mg/dL (ref 65–99)

## 2016-10-03 MED ORDER — ONDANSETRON HCL 4 MG/2ML IJ SOLN
4.0000 mg | Freq: Four times a day (QID) | INTRAMUSCULAR | Status: DC | PRN
  Administered 2016-10-03 – 2016-10-09 (×9): 4 mg via INTRAVENOUS
  Filled 2016-10-03 (×9): qty 2

## 2016-10-03 NOTE — Evaluation (Addendum)
Clinical/Bedside Swallow Evaluation Patient Details  Name: Haley Graham MRN: [id number] Date of Birth: 04/18/1955  Today's Date: 10/03/2016 Time: SLP Start Time (ACUTE ONLY): 1455 SLP Stop Time (ACUTE ONLY): 1540 SLP Time Calculation (min) (ACUTE ONLY): 45 min  Past Medical History:  Past Medical History:  Diagnosis Date  . Anxiety   . Arthritis   . Diabetes mellitus without complication ([place])   . Gastroparesis   . GERD (gastroesophageal reflux disease)   . Hypertension   . IBS (irritable bowel syndrome)    Past Surgical History:  Past Surgical History:  Procedure Laterality Date  . ABDOMINAL HYSTERECTOMY    . AORTOGRAM N/A 07/24/2016   Procedure: [facility] AND ABDOMINAL AORTA  ANGIOGRAM;  Surgeon: Waynetta Sandy, MD;  Location: [place];  Service: Vascular;  Laterality: N/A;  . CESAREAN SECTION    . CHOLECYSTECTOMY    . COLONOSCOPY    . COLONOSCOPY N/A 06/10/2016   Procedure: COLONOSCOPY;  Surgeon: Rogene Houston, MD;  [facility];  Service: Endoscopy;  Laterality: N/A;  8:30  . IR THORACENTESIS ASP PLEURAL SPACE W/IMG GUIDE  09/18/2016  . POLYPECTOMY  06/10/2016   Procedure: POLYPECTOMY;  Surgeon: Rogene Houston, MD;  Location: [facility];  Service: Endoscopy;;  multiple colon  . THORACIC AORTIC ENDOVASCULAR STENT GRAFT N/A 08/06/2016   Procedure: THORACIC AORTIC ENDOVASCULAR STENT GRAFT/Thorasic and Abdominal Angiogram, Entravascular ultrasound., Open femoral exposure,Left Brachial access, and patch angioplasty right femoral artery.;  Surgeon: Serafina Mitchell, MD;  Location: [facility] OR;  Service: Vascular;  Laterality: N/A;  . UPPER GASTROINTESTINAL ENDOSCOPY     HPI:  Haley Maietta Alversonis a 61 y.o.femalehx of aortic dissection s/p stent (recent), DM, IBS, COPD, GERD, HTN here with weakness, diarrhea. 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 FTT with little oral intake; has NGT. CXR Overall improved pulmonary aeration with persistent  bibasilar atelectasis and small left pleural effusion. No new findings.   Assessment / Plan / Recommendation Clinical Impression  Pt's oral and pharyngeal motor and sensory systems for swallow appear intact. Although small amount of solid (cracker) consumed, her oral manipulation, mastication and transit were within functional limits. Swallow initiation was timely, no evidence of decreased airway protection or pharyngeal residue. Of note she grimced with each swallow reporting it began after NGT placed. Initially there may be tenderness but it is unusual to have that degree of discomfort 9 days out from placement UNLESS it was coiled in throat or other malfunction. Observed what may be thrush on tongue and if present may be in pharynx as well to explain her odonophagia. Pt states the food tastes "dry" and husband states pt reports it is "spicy" and husband has brought in food pt has requested and takes only several bites. SLP explained that with various medications etc the food will likely have an altered taste (no matter what it is) and advised/encouraged her to eat a little more with each meal. This therapist does not recommend removing her NGT until intake significantly improves (also only running at 10 ml). Recommend upgrading to regular texture and family can order what pt can tolerate. Recommend MD consider medicine for thrush if appropriate. ST will follow up.     SLP Visit Diagnosis: Dysphagia, unspecified (R13.10)    Aspiration Risk  Mild aspiration risk    Diet Recommendation Regular;Thin liquid   Liquid Administration via: Straw;Cup Medication Administration: Whole meds with liquid Supervision: Patient able to self feed Postural Changes: Seated upright at  90 degrees    Other  Recommendations Oral Care Recommendations: Oral care BID   Follow up Recommendations None      Frequency and Duration min 1 x/week  2 weeks       Prognosis Prognosis for Safe Diet Advancement: Good       Swallow Study   General HPI: Haley Sandstrom Alversonis a 61 y.o.femalehx of aortic dissection s/p stent (recent), DM, IBS, COPD, GERD, HTN here with weakness, diarrhea. 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 FTT with little oral intake; has NGT. CXR Overall improved pulmonary aeration with persistent bibasilar atelectasis and small left pleural effusion. No new findings. Type of Study: Bedside Swallow Evaluation Previous Swallow Assessment:  (none) Diet Prior to this Study: Thin liquids (soft) Temperature Spikes Noted: No Respiratory Status: Room air History of Recent Intubation: No Behavior/Cognition: Alert;Cooperative (needed encouragement) Oral Cavity Assessment:  (question oral and pharyngeal thrush) Oral Care Completed by SLP: Yes Oral Cavity - Dentition:  (missing some posterior upper and lower) Vision: Functional for self-feeding Self-Feeding Abilities: Able to feed self Patient Positioning: Upright in bed Baseline Vocal Quality: Normal Volitional Cough: Weak Volitional Swallow: Able to elicit    Oral/Motor/Sensory Function Overall Oral Motor/Sensory Function: Within functional limits   Ice Chips Ice chips: Not tested   Thin Liquid Thin Liquid: Within functional limits Presentation: Cup;Straw    Nectar Thick Nectar Thick Liquid: Not tested   Honey Thick Honey Thick Liquid: Not tested   Puree Puree: Within functional limits   Solid   GO   Solid: Within functional limits        [name] 10/03/2016,4:21 PM  [name] [name] [employer] [phone]

## 2016-10-03 NOTE — Progress Notes (Addendum)
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 The blood culture grwing is to therefore started on fluconazole for candidemia. Evaluated by ID. ID recommended continuing fluconazole 400 mg daily through 9/26. Patient now with congestive heart failure, hypoxia and echo showed reduced EF of 30-35% with akinesis of mid-apicalanteroseptal and anterior myocardium.  Assessment & Plan:  # Sepsis due to candidemia: -Continue fluconazole until September 26 and needs follow-up with ophthalmologist in a month as per infectious disease.  #Acute systolic congestive heart failure: Currently on oral Lasix. - Cardiology consult appreciated. Monitor BMP, urine output.  -As per cardiology, plan to repeat echocardiogram early next week. If EF is still low then proceed with coronary angiogram. If EF has normalized then perform noninvasive coronary evaluation with either CT angiogram or Lexiscan Myoview. -Evaluated by palliative care, patient is full code.  #Prolonged QTC: Discontinue Azithromycin and abilify. Monitor in telemetry. Change zofran to q4hr as needed from standing order.  #Severe protein calorie malnutrition: Patient is on NG tube because of decreased oral intake and severe malnutrition. Patient once the NG tube taken out. Plan for swallowing eval is in today. If patient does well with swallow then may be able to take preventative out. Encourage oral intake. Discussed with the patient and her daughter  at bedside.  #Acute kidney injury resolved  #Acute on Chronic anemia likely due to chronic disease vs acute GI bleed: Iron stores acceptable. Fecal occult blood test positive. On Protonix. Status post 2 unit of red blood cell transfusion on 8/30 with improvement in hemoglobin. Evaluated by GI. No further endoscopy at this time.  -Monitor CBC.  #Nausea and abdominal discomfort: Abdominal x-ray consistent with adynamic ileus. Evaluated by GI. Patient is tolerating tube feeds. Continue Zofran and symptomatic treatment. Unable to take reglan because of risk of QT prolongation.  #Thrombocytopenia likely due to acute illness: No sign of bleeding. Monitor CBC. Platelet count 81. Minimize blood draws.  #Anxiety depression: Evaluated by psychiatrist. -Continue Xanax, Cymbalta and Vyvanse per psychiatrist  # hypokalemia/hypomagnesemia: Electrolytes improved. Monitor labs.   DVT prophylaxis: SCD. Patient with anemia and thrombocytopenia Code Status: Full code Family Communication: discussed with patient's daughter at bedside Disposition Plan: Currently admitted, likely discharge to skilled facility in 3-4 days    Consultants:   Infectious disease  Psychiatrist  Palliative care  Cardiologist  GI  Procedures:  Thoracentesis 8/17  Echo 8/28 EF 30-35%, akinesis of mid-apicalanteroseptal and anterior myocardium.  Diastolic dysfunction.  Echo 8/13 EF 76-28, diastolic dysfunction, lipomatous hypertrophy Antimicrobials: Fluconazole Subjective: Seen and examined at bedside. Has mild weakness. Denied headache, dizziness, no vomiting, nausea is better. Abdomen pain is slowly improving. No chest pain  Objective: Vitals:   09/14/2016 2200 09/18/2016 2345 10/03/16 0400 10/03/16 0800  BP:  (!) 100/52 (!) 104/47 104/60  Pulse: 100 (!) 101  98  Resp: 16 19  19   Temp:  97.6 F (36.4 C) 97.7 F (36.5 C) 98.4 F (36.9 C)  TempSrc:  Oral Oral Oral  SpO2: 100% 100%  100%  Weight:      Height:  Intake/Output Summary (Last 24 hours) at 10/03/16 1216 Last data filed at 10/03/16 0500  Gross per 24 hour  Intake              240 ml  Output              300 ml  Net              -60 ml   Filed Weights   09/30/16 0353 10/01/16 0517 09/02/2016 0413  Weight: 70.1 kg (154 lb 9.6 oz) 69.2 kg (152 lb 8 oz) 68 kg (150 lb)    Examination:  General exam: Lying in bed comfortable, has NG tube Respiratory system: Bibasal decreased breath sound, respiratory effort normal Cardiovascular system: Regular rate rhythm, S1-S2 normal. Trace bilateral lower extremity edema.. Gastrointestinal system: Abdomen soft, nontender, bowel sound positive. Central nervous system: Alert and oriented. No focal neurological deficits. Skin: No rashes, lesions or ulcers Psychiatry: Judgement and insight appear normal. Mood & affect appropriate.     Data Reviewed: I have personally reviewed following labs and imaging studies  CBC:  Recent Labs Lab 09/29/16 0607 09/30/16 0911 10/01/16 0424 09/05/2016 0502 10/03/16 0547  WBC 8.1 6.6 5.5 5.8 6.0  HGB 7.8* 7.4* 6.9* 10.8* 9.7*  HCT 25.0* 23.4* 21.9* 34.4* 29.8*  MCV 102.9* 100.9* 101.4* 96.6 97.7  PLT 95* 94* 99* 92* 81*   Basic Metabolic Panel:  Recent Labs Lab 09/27/16 0610 09/28/16 0731 09/29/16 0607 09/30/16 0911 10/01/16 0424 09/20/2016 0352 10/03/16 0547  NA 138 140 139 137 137 137 135  K 3.5 4.1 3.6 3.4* 2.9* 3.8 4.5  CL 107 108 107 105 105 106 103  CO2 24 23 25 25 26 24 22   GLUCOSE 133* 138* 127* 95 90 89 76  BUN 12 15 15 15 12 11 10   CREATININE 0.87 0.89 0.86 0.89 0.88 0.86 0.79  CALCIUM 7.9* 7.6* 6.9* 7.1* 7.0* 7.2* 7.0*  MG 1.6* 1.7 1.5* 1.8 1.3* 1.6* 1.8  PHOS 3.3 4.0 3.8 3.2 3.2  --   --    GFR: Estimated Creatinine Clearance: 69.2 mL/min (by C-G formula based on SCr of 0.79 mg/dL). Liver Function Tests:  Recent Labs Lab 09/27/16 0610 09/28/16 0731 09/29/16 0607 09/30/16 0911  AST 21 25 23 20   ALT 11* 13* 13* 14    ALKPHOS 488* 756* 660* 489*  BILITOT 1.0 1.2 1.0 0.9  PROT 5.2* 4.9* 4.5* 4.5*  ALBUMIN 2.1* 1.9* 1.7* 1.5*   No results for input(s): LIPASE, AMYLASE in the last 168 hours. No results for input(s): AMMONIA in the last 168 hours. Coagulation Profile: No results for input(s): INR, PROTIME in the last 168 hours. Cardiac Enzymes: No results for input(s): CKTOTAL, CKMB, CKMBINDEX, TROPONINI in the last 168 hours. BNP (last 3 results) No results for input(s): PROBNP in the last 8760 hours. HbA1C: No results for input(s): HGBA1C in the last 72 hours. CBG:  Recent Labs Lab 09/05/2016 1709 09/29/2016 2111 09/02/2016 2342 10/03/16 0412 10/03/16 1125  GLUCAP 138* 98 96 90 115*   Lipid Profile: No results for input(s): CHOL, HDL, LDLCALC, TRIG, CHOLHDL, LDLDIRECT in the last 72 hours. Thyroid Function Tests: No results for input(s): TSH, T4TOTAL, FREET4, T3FREE, THYROIDAB in the last 72 hours. Anemia Panel: No results for input(s): VITAMINB12, FOLATE, FERRITIN, TIBC, IRON, RETICCTPCT in the last 72 hours. Sepsis Labs: No results for input(s): PROCALCITON, LATICACIDVEN in the last 168 hours.  No results found for this or any previous visit (from the past 240  hour(s)).       Radiology Studies: Dg Abd Portable 1v  Result Date: 09/02/2016 CLINICAL DATA:  Left lower quadrant pain. EXAM: PORTABLE ABDOMEN - 1 VIEW COMPARISON:  09/25/2016. FINDINGS: Thoracic aortic stent noted. Feeding tube noted with its tip over the duodenum. Distended loops of small and large bowel are noted suggesting adynamic ileus. No free air. IMPRESSION: Feeding tube noted with tip projected over the duodenum. Distended loops of small large bowel are noted suggesting adynamic ileus . Electronically Signed   By: Robinson   On: 09/25/2016 10:59        Scheduled Meds: . ALPRAZolam  0.25 mg Oral QHS  . aspirin EC  81 mg Oral Daily  . dronabinol  5 mg Oral BID AC  . DULoxetine  60 mg Oral Daily  . feeding  supplement (ENSURE ENLIVE)  237 mL Oral TID BM  . feeding supplement (PRO-STAT SUGAR FREE 64)  30 mL Oral TID WC  . feeding supplement (VITAL 1.5 CAL)  1,000 mL Per Tube Q24H  . fluconazole  400 mg Oral Daily  . furosemide  40 mg Oral Daily  . insulin aspart  0-5 Units Subcutaneous QHS  . insulin aspart  0-9 Units Subcutaneous TID WC  . lisdexamfetamine  20 mg Oral Daily  . metoprolol tartrate  25 mg Oral BID  . ondansetron (ZOFRAN) IV  4 mg Intravenous Q6H  . pantoprazole  40 mg Oral QHS  . potassium chloride  40 mEq Oral BID  . sodium chloride flush  3 mL Intravenous Q12H   Continuous Infusions:    LOS: 24 days    Spirit Wernli Tanna Furry, MD [facility] Pager [phone]  If 7PM-7AM, please contact night-coverage [email or web address] Password [email or web address] 10/03/2016, 12:16 PM

## 2016-10-03 NOTE — Progress Notes (Signed)
Progress Note  Patient Name: Haley Graham Date of Encounter: 10/03/2016  Primary Cardiologist: Dr Sallyanne Kuster  Subjective   Remains ill, the patient denies CP or SOB.  No new concerns  Inpatient Medications    Scheduled Meds: . ALPRAZolam  0.25 mg Oral QHS  . aspirin EC  81 mg Oral Daily  . dronabinol  5 mg Oral BID AC  . DULoxetine  60 mg Oral Daily  . feeding supplement (ENSURE ENLIVE)  237 mL Oral TID BM  . feeding supplement (PRO-STAT SUGAR FREE 64)  30 mL Oral TID WC  . feeding supplement (VITAL 1.5 CAL)  1,000 mL Per Tube Q24H  . fluconazole  400 mg Oral Daily  . furosemide  40 mg Oral Daily  . insulin aspart  0-5 Units Subcutaneous QHS  . insulin aspart  0-9 Units Subcutaneous TID WC  . lisdexamfetamine  20 mg Oral Daily  . metoprolol tartrate  25 mg Oral BID  . ondansetron (ZOFRAN) IV  4 mg Intravenous Q6H  . pantoprazole  40 mg Oral QHS  . potassium chloride  40 mEq Oral BID  . sodium chloride flush  3 mL Intravenous Q12H   Continuous Infusions:  PRN Meds: acetaminophen **OR** acetaminophen, alum & mag hydroxide-simeth, fluticasone, ipratropium-albuterol, lidocaine, loperamide, menthol-cetylpyridinium, senna-docusate, sodium phosphate, traMADol   Vital Signs    Vitals:   09/09/2016 2200 09/20/2016 2345 10/03/16 0400 10/03/16 0800  BP:  (!) 100/52 (!) 104/47 104/60  Pulse: 100 (!) 101  98  Resp: 16 19  19   Temp:  97.6 F (36.4 C) 97.7 F (36.5 C) 98.4 F (36.9 C)  TempSrc:  Oral Oral Oral  SpO2: 100% 100%  100%  Weight:      Height:        Intake/Output Summary (Last 24 hours) at 10/03/16 1226 Last data filed at 10/03/16 0500  Gross per 24 hour  Intake              240 ml  Output              300 ml  Net              -60 ml   Filed Weights   09/30/16 0353 10/01/16 0517 09/05/2016 0413  Weight: 154 lb 9.6 oz (70.1 kg) 152 lb 8 oz (69.2 kg) 150 lb (68 kg)    Telemetry    Sinus tachycardia - Personally Reviewed  Physical Exam   GEN- The  patient is veryl ill appearing, alert  Head- normocephalic, atraumatic Eyes-  Sclera clear, conjunctiva pink Ears- hearing intact Oropharynx- clear Neck- supple, Lungs- decreased BS at bases Heart- Regular rate and rhythm  GI- soft, NT, ND, + BS Extremities- + dependant edema MS- diffuse atrophy Skin- ecchymosis diffusely across her chest Psych- euthymic mood, full affect Neuro- strength and sensation are intact   Labs    Chemistry Recent Labs Lab 09/28/16 0731 09/29/16 0607 09/30/16 0911 10/01/16 0424 09/20/2016 0352 10/03/16 0547  NA 140 139 137 137 137 135  K 4.1 3.6 3.4* 2.9* 3.8 4.5  CL 108 107 105 105 106 103  CO2 23 25 25 26 24 22   GLUCOSE 138* 127* 95 90 89 76  BUN 15 15 15 12 11 10   CREATININE 0.89 0.86 0.89 0.88 0.86 0.79  CALCIUM 7.6* 6.9* 7.1* 7.0* 7.2* 7.0*  PROT 4.9* 4.5* 4.5*  --   --   --   ALBUMIN 1.9* 1.7* 1.5*  --   --   --  AST 25 23 20   --   --   --   ALT 13* 13* 14  --   --   --   ALKPHOS 756* 660* 489*  --   --   --   BILITOT 1.2 1.0 0.9  --   --   --   GFRNONAA >60 >60 >60 >60 >60 >60  GFRAA >60 >60 >60 >60 >60 >60  ANIONGAP 9 7 7 6 7 10      Hematology Recent Labs Lab 10/01/16 0424 09/17/2016 0502 10/03/16 0547  WBC 5.5 5.8 6.0  RBC 2.16* 3.56* 3.05*  HGB 6.9* 10.8* 9.7*  HCT 21.9* 34.4* 29.8*  MCV 101.4* 96.6 97.7  MCH 31.9 30.3 31.8  MCHC 31.5 31.4 32.6  RDW 23.5* 20.2* 20.8*  PLT 99* 92* 81*    Cardiac EnzymesNo results for input(s): TROPONINI in the last 168 hours. No results for input(s): TROPIPOC in the last 168 hours.     Patient Profile       61 y.o. 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 Heart failure symptoms resolved promptly with diuretic therapy.  Assessment & Plan    1. Acute systolic dysfunction Abrupt decline in EF (30%) on echo 09/29/16 when compared to 09/14/16 (preserved). This may be secondary to myocardial infarction.  Differential diagnosis includes coronary insufficiency due to CAD, proximal extension of her dissection with involvement of the left coronary artery (not seen on CT 8/26) and takotsubo syndrome. She is currently too ill for cath.  Medical management is planned at this time. Repeat echo next week.  If EF remains depressed, could consider cath. Would not advise cath until anemia, thrombocytopenia, etc are resolved.  If EF normalizes by follow-up echo, then long term medical management would be preferred.  2. Aortic dissection, s/p descending thoracic aortic stent graft placement by vascular surgery 5/18 Stable CT reviewed from 09/27/16  3. Anemia/ thrombocytopenia Per primary team May benefit from hematology evaluation if not improved with treatment of medical illness  4. QT prolongation Avoid QT prolonging medicines  5. Fungemia Stable  This patient is very ill.  She has a complex medical situation.  Her prognosis is poor. Ultimately, palliative options may be best.  Thompson Grayer MD, [facility] 10/03/2016 12:35 PM

## 2016-10-03 DEATH — deceased

## 2016-10-04 LAB — GLUCOSE, CAPILLARY
GLUCOSE-CAPILLARY: 81 mg/dL (ref 65–99)
GLUCOSE-CAPILLARY: 105 mg/dL — AB (ref 65–99)
Glucose-Capillary: 113 mg/dL — ABNORMAL HIGH (ref 65–99)
Glucose-Capillary: 111 mg/dL — ABNORMAL HIGH (ref 65–99)
Glucose-Capillary: 143 mg/dL — ABNORMAL HIGH (ref 65–99)

## 2016-10-04 MED ORDER — VITAL 1.5 CAL PO LIQD
1000.0000 mL | ORAL | Status: DC
  Administered 2016-10-04 – 2016-10-07 (×4): 1000 mL
  Filled 2016-10-04 (×7): qty 1000

## 2016-10-04 MED ORDER — VITAL 1.5 CAL PO LIQD: 20.0000 mL | ORAL | Status: DC

## 2016-10-04 MED ORDER — FUROSEMIDE 10 MG/ML IJ SOLN
40.0000 mg | Freq: Every day | INTRAMUSCULAR | Status: DC
  Administered 2016-10-04 – 2016-10-07 (×4): 40 mg via INTRAVENOUS
  Filled 2016-10-04 (×4): qty 4

## 2016-10-04 NOTE — Progress Notes (Addendum)
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 The blood culture grwing is to therefore started on fluconazole for candidemia. Evaluated by ID. ID recommended continuing fluconazole 400 mg daily through 9/26. Patient now with congestive heart failure, hypoxia and echo showed reduced EF of 30-35% with akinesis of mid-apicalanteroseptal and anterior myocardium.  Assessment & Plan:  # Sepsis due to candidemia: -Continue fluconazole until September 26 and needs follow-up with ophthalmologist in a month as per infectious disease.  #Acute systolic congestive heart failure: Currently on oral Lasix. - Cardiology consult appreciated. Monitor BMP, urine output.  -As per cardiology, plan to repeat echocardiogram early next week. If EF is still low then proceed with coronary angiogram. If EF has normalized then perform noninvasive coronary evaluation with either CT angiogram or Lexiscan Myoview. -Patient was already evaluated by palliative care team. Patient is currently full code. -Urine output is only 400 mL in 24 hours. I will switch to Lasix IV daily. Monitor urine output.  #Prolonged QTC: Discontinue Azithromycin and abilify. Monitor in telemetry. Change zofran to q4hr as needed from standing order.  #Severe protein calorie malnutrition: Patient is on NG tube because of decreased oral intake and severe malnutrition. Evaluated by his swallow team. Patient's husband requesting to increase the rate of  tube feeding to 20 mL per hour. Discussed with the nursing staff. Encourage to take orally.  #Acute kidney injury resolved  #Acute on Chronic anemia likely due to chronic disease vs acute GI bleed: Iron stores acceptable. Fecal occult blood test positive. On Protonix. Status post 2 unit of red blood cell transfusion on 8/30 with improvement in hemoglobin. Evaluated by GI. No further endoscopy at this time.  -Monitor CBC.  #Nausea and abdominal discomfort: Abdominal x-ray consistent with adynamic ileus. Evaluated by GI. Patient is tolerating tube feeds. Increase the rate of 2 feet today. Continue Zofran and symptomatic treatment. Unable to take reglan because of risk of QT prolongation.  #Thrombocytopenia likely due to acute illness: No sign of bleeding. Monitor CBC. Platelet count 81. Repeat labs tomorrow  #Anxiety depression: Evaluated by psychiatrist. -Continue Xanax, Cymbalta and Vyvanse per psychiatrist  # hypokalemia/hypomagnesemia: Electrolytes improved. Monitor labs.  No significant improvement in clinical condition. Prognosis remains poor. Evaluation and management by multi specialty team. Discussed with the patient and her husband at bedside.   DVT prophylaxis: SCD. Patient with anemia and thrombocytopenia Code Status: Full code Family Communication: discussed with patient's husband Disposition Plan: Currently admitted, likely discharge to skilled facility in 3-4 days    Consultants:   Infectious disease  Psychiatrist  Palliative care  Cardiologist  GI  Procedures:  Thoracentesis 8/17  Echo 8/28 EF 30-35%, akinesis of mid-apicalanteroseptal and anterior myocardium.  Diastolic dysfunction.  Echo 8/13 EF 82-99, diastolic dysfunction, lipomatous hypertrophy Antimicrobials: Fluconazole Subjective: Seen and examined at bedside. Had generalized weakness fatigue and nausea. Abdominal pain is mild today. No vomiting. Denied chest pain.  Objective: Vitals:    10/04/16 0400 10/04/16 0511 10/04/16 0800 10/04/16 1200  BP:   (!) 93/51 (!) 112/53  Pulse:   97 (!) 107  Resp:   13 15  Temp: 98 F (36.7 C)  98.7 F (37.1 C) 98 F (36.7 C)  TempSrc: Axillary  Oral Oral  SpO2:   100% 100%  Weight:  72.6 kg (160 lb)    Height:        Intake/Output Summary (Last 24 hours) at 10/04/16 1220 Last data filed at 10/04/16 0700  Gross per 24 hour  Intake              210 ml  Output              400 ml  Net             -190 ml   Filed Weights   10/01/16 0517 09/06/2016 0413 10/04/16 0511  Weight: 69.2 kg (152 lb 8 oz) 68 kg (150 lb) 72.6 kg (160 lb)    Examination:  General exam: Ill-looking female lying on bed comfortable, has NG tube Respiratory system: Bibasal decreased,respiratoryeffortnormal Cardiovascular system: Regular rate rhythm, S1-S2 normal. Trace bilateral lower extremities edema. Gastrointestinal system: Abdomen soft, nontender, mild distention. Bowel sound positive. Central nervous system: Alert and oriented. No focal neurological deficits. Skin: No rashes, lesions or ulcers Psychiatry: Judgement and insight appear normal. Mood & affect appropriate.     Data Reviewed: I have personally reviewed following labs and imaging studies  CBC:  Recent Labs Lab 09/29/16 0607 09/30/16 0911 10/01/16 0424 09/29/2016 0502 10/03/16 0547  WBC 8.1 6.6 5.5 5.8 6.0  HGB 7.8* 7.4* 6.9* 10.8* 9.7*  HCT 25.0* 23.4* 21.9* 34.4* 29.8*  MCV 102.9* 100.9* 101.4* 96.6 97.7  PLT 95* 94* 99* 92* 81*   Basic Metabolic Panel:  Recent Labs Lab 09/28/16 0731 09/29/16 0607 09/30/16 0911 10/01/16 0424 09/26/2016 0352 10/03/16 0547  NA 140 139 137 137 137 135  K 4.1 3.6 3.4* 2.9* 3.8 4.5  CL 108 107 105 105 106 103  CO2 23 25 25 26 24 22   GLUCOSE 138* 127* 95 90 89 76  BUN 15 15 15 12 11 10   CREATININE 0.89 0.86 0.89 0.88 0.86 0.79  CALCIUM 7.6* 6.9* 7.1* 7.0* 7.2* 7.0*  MG 1.7 1.5* 1.8 1.3* 1.6* 1.8  PHOS 4.0 3.8 3.2 3.2  --   --     GFR: Estimated Creatinine Clearance: 71.4 mL/min (by C-G formula based on SCr of 0.79 mg/dL). Liver Function Tests:  Recent Labs Lab 09/28/16 0731 09/29/16 0607 09/30/16 0911  AST 25 23 20   ALT 13* 13* 14  ALKPHOS 756* 660* 489*  BILITOT 1.2 1.0 0.9  PROT 4.9* 4.5* 4.5*  ALBUMIN 1.9* 1.7* 1.5*   No results for input(s): LIPASE, AMYLASE in the last 168 hours. No results for input(s): AMMONIA in the last 168 hours. Coagulation Profile: No results for input(s): INR, PROTIME in the last 168 hours. Cardiac Enzymes: No results for input(s): CKTOTAL, CKMB, CKMBINDEX, TROPONINI in the last 168 hours. BNP (last 3 results) No results for input(s): PROBNP in the last 8760 hours. HbA1C: No results for input(s): HGBA1C in the last 72 hours. CBG:  Recent Labs Lab 10/03/16 1957 10/04/16 0002 10/04/16 0351 10/04/16 0755 10/04/16 1110  GLUCAP 99 93 81 113* 111*   Lipid Profile: No results for input(s): CHOL, HDL, LDLCALC, TRIG, CHOLHDL, LDLDIRECT in the last 72 hours. Thyroid Function Tests: No results for input(s): TSH, T4TOTAL, FREET4, T3FREE, THYROIDAB in the last 72 hours. Anemia Panel: No results for input(s): VITAMINB12, FOLATE, FERRITIN, TIBC, IRON, RETICCTPCT in the last 72 hours. Sepsis Labs: No results for input(s): PROCALCITON, LATICACIDVEN  in the last 168 hours.  No results found for this or any previous visit (from the past 240 hour(s)).       Radiology Studies: No results found.      Scheduled Meds: . ALPRAZolam  0.25 mg Oral QHS  . aspirin EC  81 mg Oral Daily  . dronabinol  5 mg Oral BID AC  . DULoxetine  60 mg Oral Daily  . feeding supplement (ENSURE ENLIVE)  237 mL Oral TID BM  . feeding supplement (PRO-STAT SUGAR FREE 64)  30 mL Oral TID WC  . feeding supplement (VITAL 1.5 CAL)  1,000 mL Per Tube Q24H  . fluconazole  400 mg Oral Daily  . furosemide  40 mg Oral Daily  . insulin aspart  0-5 Units Subcutaneous QHS  . insulin aspart  0-9 Units  Subcutaneous TID WC  . lisdexamfetamine  20 mg Oral Daily  . metoprolol tartrate  25 mg Oral BID  . pantoprazole  40 mg Oral QHS  . potassium chloride  40 mEq Oral BID  . sodium chloride flush  3 mL Intravenous Q12H   Continuous Infusions:    LOS: 25 days    Dron Tanna Furry, MD [facility] Pager [phone]  If 7PM-7AM, please contact night-coverage [email or web address] Password [email or web address] 10/04/2016, 12:20 PM

## 2016-10-04 NOTE — Progress Notes (Signed)
Progress Note  Patient Name: Haley Graham Date of Encounter: 10/04/2016  Primary Cardiologist: Dr Sallyanne Kuster  Subjective   Doing well today, the patient denies CP or SOB.  No new concerns  Inpatient Medications    Scheduled Meds: . ALPRAZolam  0.25 mg Oral QHS  . aspirin EC  81 mg Oral Daily  . dronabinol  5 mg Oral BID AC  . DULoxetine  60 mg Oral Daily  . feeding supplement (ENSURE ENLIVE)  237 mL Oral TID BM  . feeding supplement (PRO-STAT SUGAR FREE 64)  30 mL Oral TID WC  . feeding supplement (VITAL 1.5 CAL)  1,000 mL Per Tube Q24H  . fluconazole  400 mg Oral Daily  . furosemide  40 mg Oral Daily  . insulin aspart  0-5 Units Subcutaneous QHS  . insulin aspart  0-9 Units Subcutaneous TID WC  . lisdexamfetamine  20 mg Oral Daily  . metoprolol tartrate  25 mg Oral BID  . pantoprazole  40 mg Oral QHS  . potassium chloride  40 mEq Oral BID  . sodium chloride flush  3 mL Intravenous Q12H   Continuous Infusions:  PRN Meds: acetaminophen **OR** acetaminophen, alum & mag hydroxide-simeth, fluticasone, ipratropium-albuterol, lidocaine, loperamide, menthol-cetylpyridinium, ondansetron (ZOFRAN) IV, senna-docusate, sodium phosphate, traMADol   Vital Signs    Vitals:   10/04/16 0005 10/04/16 0400 10/04/16 0511 10/04/16 0800  BP:    (!) 93/51  Pulse:    97  Resp:    13  Temp: 98.1 F (36.7 C) 98 F (36.7 C)  98.7 F (37.1 C)  TempSrc: Oral Axillary  Oral  SpO2:    100%  Weight:   160 lb (72.6 kg)   Height:        Intake/Output Summary (Last 24 hours) at 10/04/16 1001 Last data filed at 10/04/16 0700  Gross per 24 hour  Intake              210 ml  Output              400 ml  Net             -190 ml   Filed Weights   10/01/16 0517 10/01/2016 0413 10/04/16 0511  Weight: 152 lb 8 oz (69.2 kg) 150 lb (68 kg) 160 lb (72.6 kg)    Physical Exam   GEN- The patient is very ill appearing, alert  Head- normocephalic, atraumatic Eyes-  Sclera clear, conjunctiva  pink Ears- hearing intact Oropharynx- clear Neck- supple, Lungs- decreased BS at bases Heart- Regular rate and rhythm  GI- soft, NT, ND, + BS Extremities- no clubbing, cyanosis + dependant edema MS- diffuse atrophy Skin-ecchymosis across her chest Psych- flat affect Neuro- strength and sensation are intact   Labs    Chemistry Recent Labs Lab 09/28/16 0731 09/29/16 0607 09/30/16 0911 10/01/16 0424 09/05/2016 0352 10/03/16 0547  NA 140 139 137 137 137 135  K 4.1 3.6 3.4* 2.9* 3.8 4.5  CL 108 107 105 105 106 103  CO2 23 25 25 26 24 22   GLUCOSE 138* 127* 95 90 89 76  BUN 15 15 15 12 11 10   CREATININE 0.89 0.86 0.89 0.88 0.86 0.79  CALCIUM 7.6* 6.9* 7.1* 7.0* 7.2* 7.0*  PROT 4.9* 4.5* 4.5*  --   --   --   ALBUMIN 1.9* 1.7* 1.5*  --   --   --   AST 25 23 20   --   --   --  ALT 13* 13* 14  --   --   --   ALKPHOS 756* 660* 489*  --   --   --   BILITOT 1.2 1.0 0.9  --   --   --   GFRNONAA >60 >60 >60 >60 >60 >60  GFRAA >60 >60 >60 >60 >60 >60  ANIONGAP 9 7 7 6 7 10      Hematology Recent Labs Lab 10/01/16 0424 09/06/2016 0502 10/03/16 0547  WBC 5.5 5.8 6.0  RBC 2.16* 3.56* 3.05*  HGB 6.9* 10.8* 9.7*  HCT 21.9* 34.4* 29.8*  MCV 101.4* 96.6 97.7  MCH 31.9 30.3 31.8  MCHC 31.5 31.4 32.6  RDW 23.5* 20.2* 20.8*  PLT 99* 92* 81*    Cardiac EnzymesNo results for input(s): TROPONINI in the last 168 hours. No results for input(s): TROPIPOC in the last 168 hours.     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 Heart failure symptoms resolved promptly with diuretic therapy.  Assessment & Plan    1. Acute systolic dysfunction Abrupt decline in EF (30%) on echo 09/29/16 when compared to 09/14/16 (preserved). This may be secondary to myocardial infarction.  Differential diagnosis includes coronary insufficiency due to CAD, proximal extension of her dissection with involvement of the left coronary artery (not seen on CT 8/26)and takotsubo syndrome. Too ill for cath currently.  Would continue medical therapy and then reassess if she improves. Repeat echo next week.  If EF remains depressed, could consider cath. Would not advise cath until anemia, thrombocytopenia, etc are resolved.  If EF normalizes by follow-up echo, then long term medical management would be preferred.  2. Aortic dissection Stable No change required today s/p descending thoracic aortic stent graft placement by vascular surgery 5/18 CT reviewed from 09/27/16  3. Anemia/ thrombocytopenia Per primary team  4. QT prolongation Avoid QT prolonging drugs  5. Fungemia Stable No change required today  Very ill patient.  Complex medical situation.  Poor prognosis.  Consider palliative options if not improving soon.  Thompson Grayer MD, [facility] 10/04/2016 10:01 AM

## 2016-10-05 DIAGNOSIS — R9430 Abnormal result of cardiovascular function study, unspecified: Secondary | ICD-10-CM

## 2016-10-05 DIAGNOSIS — R931 Abnormal findings on diagnostic imaging of heart and coronary circulation: Secondary | ICD-10-CM

## 2016-10-05 LAB — BASIC METABOLIC PANEL
Anion gap: 7 (ref 5–15)
BUN: 8 mg/dL (ref 6–20)
CALCIUM: 7 mg/dL — AB (ref 8.9–10.3)
CO2: 27 mmol/L (ref 22–32)
CREATININE: 0.76 mg/dL (ref 0.44–1.00)
Chloride: 103 mmol/L (ref 101–111)
GFR calc non Af Amer: >60 mL/min (ref >60)
Glucose, Bld: 93 mg/dL (ref 65–99)
Potassium: 3.6 mmol/L (ref 3.5–5.1)
SODIUM: 137 mmol/L (ref 135–145)

## 2016-10-05 LAB — GLUCOSE, CAPILLARY
GLUCOSE-CAPILLARY: 130 mg/dL — AB (ref 65–99)
GLUCOSE-CAPILLARY: 60 mg/dL — AB (ref 65–99)
GLUCOSE-CAPILLARY: 105 mg/dL — AB (ref 65–99)
Glucose-Capillary: 106 mg/dL — ABNORMAL HIGH (ref 65–99)
Glucose-Capillary: 107 mg/dL — ABNORMAL HIGH (ref 65–99)
Glucose-Capillary: 163 mg/dL — ABNORMAL HIGH (ref 65–99)
Glucose-Capillary: 70 mg/dL (ref 65–99)

## 2016-10-05 LAB — CBC
HCT: 34.2 % — ABNORMAL LOW (ref 36.0–46.0)
Hemoglobin: 10.9 g/dL — ABNORMAL LOW (ref 12.0–15.0)
MCH: 31.4 pg (ref 26.0–34.0)
MCHC: 31.9 g/dL (ref 30.0–36.0)
MCV: 98.6 fL (ref 78.0–100.0)
PLATELETS: 81 10*3/uL — AB (ref 150–400)
RBC: 3.47 MIL/uL — ABNORMAL LOW (ref 3.87–5.11)
RDW: 20.5 % — ABNORMAL HIGH (ref 11.5–15.5)
WBC: 5.2 10*3/uL (ref 4.0–10.5)

## 2016-10-05 LAB — MAGNESIUM: MAGNESIUM: 1.2 mg/dL — AB (ref 1.7–2.4)

## 2016-10-05 MED ORDER — PRO-STAT SUGAR FREE PO LIQD
30.0000 mL | Freq: Three times a day (TID) | ORAL | Status: DC
  Administered 2016-10-05 – 2016-10-08 (×8): 30 mL
  Filled 2016-10-05 (×7): qty 30

## 2016-10-05 MED ORDER — MAGNESIUM SULFATE 4 GM/100ML IV SOLN
4.0000 g | Freq: Once | INTRAVENOUS | Status: AC
  Administered 2016-10-05: 4 g via INTRAVENOUS
  Filled 2016-10-05: qty 100

## 2016-10-05 MED ORDER — GLUCOSE 40 % PO GEL
ORAL | Status: AC
  Administered 2016-10-05: 37.5 g
  Filled 2016-10-05: qty 1

## 2016-10-05 NOTE — Progress Notes (Signed)
PROGRESS NOTE    Haley Graham  BJY:[id number] DOB: [birth year] DOA: 09/22/2016 PCP: Glenda Chroman, MD   Brief Narrative: 61 y.o.femalewith medical history significant of [place] type B aortic dissection status post descending thoracic aortic stent graft placement per vascular surgery during last hospitalization from 06/20/2016-08/22/2016, a history of diabetes mellitus with gastroparesis, gastroesophageal reflux disease, hypertension, IBS, abdominal aortic aneurysm, anxiety, depression presented with generalized weakness and sepsis on admission. The blood culture grwing is to therefore started on fluconazole for candidemia. Evaluated by ID. ID recommended continuing fluconazole 400 mg daily through 9/26. Patient now with congestive heart failure, hypoxia and echo showed reduced EF of 30-35% with akinesis of mid-apicalanteroseptal and anterior myocardium.  Assessment & Plan:  # Sepsis due to candidemia: -Continue fluconazole until September 26 and needs follow-up with ophthalmologist in a month as per infectious disease.  #Acute systolic congestive heart failure: Currently on oral Lasix. - Cardiology consult appreciated. Monitor BMP, urine output.  -Planned for echocardiogram and repeat EKG today. Discussed with the cardiologist today. Depending on echocardiogram finding patient may need Lexiscan Myoview versus cath. -Currently on IV Lasix. Monitor urine output and electrolytes.  #Prolonged QTC: Discontinue Azithromycin and abilify. Monitor in telemetry. Change zofran to q4hr as needed from standing order. Repeat EKG.  #Severe protein calorie malnutrition: Patient is on NG tube because of decreased oral intake and severe malnutrition. Evaluated by his swallow team. Seems like patient is tolerating tube feeding well. Plan to continue for now. Patient's husband wants to continue tube feeding . Palliative care is following.  #Acute kidney injury resolved  #Acute on Chronic anemia likely  due to chronic disease vs acute GI bleed: Iron stores acceptable. Fecal occult blood test positive. On Protonix. Status post 2 unit of red blood cell transfusion on 8/30 with improvement in hemoglobin. Evaluated by GI. No further endoscopy at this time. Hemoglobin level 10.9 today. -Monitor CBC.  #Nausea and abdominal discomfort: Abdominal x-ray consistent with adynamic ileus. Evaluated by GI. Patient is tolerating tube feeds. Continue Zofran and symptomatic treatment. Unable to take reglan because of risk of QT prolongation. Clinically stable and denies abdominal pain today.  #Thrombocytopenia likely due to acute illness: No sign of bleeding. Monitor CBC. Platelet count 81. Monitor labs  #Anxiety depression: Evaluated by psychiatrist. -Continue Xanax, Cymbalta and Vyvanse per psychiatrist  # hypokalemia/hypomagnesemia: Magnesium level I.2 today, replete magnesium sulfate. Potassium level acceptable. Continue to monitor   DVT prophylaxis: SCD. Patient with anemia and thrombocytopenia Code Status: Full code Family Communication: discussed with patient's husband at bedside Disposition Plan: Currently admitted, likely discharge to skilled facility in 3-4 days    Consultants:   Infectious disease  Psychiatrist  Palliative care  Cardiologist  GI  Procedures:  Thoracentesis 8/17  Echo 8/28 EF 30-35%, akinesis of mid-apicalanteroseptal and anterior myocardium.  Diastolic dysfunction.  Echo 8/13 EF 08-65, diastolic dysfunction, lipomatous hypertrophy Antimicrobials: Fluconazole Subjective: Seen and examined at bedside. He rested well. Denied headache, dizziness, nausea vomiting or chest pain. Feels weak Objective: Vitals:   10/04/16 1600 10/04/16 2000 10/05/16 0000 10/05/16 0805  BP: (!) 113/91 (!) 110/59 103/69 111/63  Pulse: 99 98  100  Resp: 18 16 16 18   Temp: 98.9 F (37.2 C) 98.4 F (36.9 C)  98 F (36.7 C)  TempSrc: Oral Oral  Oral  SpO2:  100% 100% 100%  Weight:       Height:        Intake/Output Summary (Last 24 hours) at 10/05/16 1112 Last data  filed at 10/04/16 2117  Gross per 24 hour  Intake              120 ml  Output              700 ml  Net             -580 ml   Filed Weights   10/01/16 0517 09/03/2016 0413 10/04/16 0511  Weight: 69.2 kg (152 lb 8 oz) 68 kg (150 lb) 72.6 kg (160 lb)    Examination:  General exam: Not in distress, lying in bed with NG tube for feeding Respiratory system: Bibasal decreased breath sound, respiratory effort normal Cardiovascular system: Regular rate rhythm, S1-S2 normal. Trace lower extremities edema. Gastrointestinal system: Abdomen soft, nontender, mild distention. Bowel sound positive.. Central nervous system: Alert and oriented. No focal neurological deficits. Skin: No rashes, lesions or ulcers Psychiatry: Judgement and insight appear normal. Mood & affect appropriate.     Data Reviewed: I have personally reviewed following labs and imaging studies  CBC:  Recent Labs Lab 09/30/16 0911 10/01/16 0424 09/15/2016 0502 10/03/16 0547 10/05/16 0251  WBC 6.6 5.5 5.8 6.0 5.2  HGB 7.4* 6.9* 10.8* 9.7* 10.9*  HCT 23.4* 21.9* 34.4* 29.8* 34.2*  MCV 100.9* 101.4* 96.6 97.7 98.6  PLT 94* 99* 92* 81* 81*   Basic Metabolic Panel:  Recent Labs Lab 09/29/16 0607 09/30/16 0911 10/01/16 0424 09/19/2016 0352 10/03/16 0547 10/05/16 0251  NA 139 137 137 137 135 137  K 3.6 3.4* 2.9* 3.8 4.5 3.6  CL 107 105 105 106 103 103  CO2 25 25 26 24 22 27   GLUCOSE 127* 95 90 89 76 93  BUN 15 15 12 11 10 8   CREATININE 0.86 0.89 0.88 0.86 0.79 0.76  CALCIUM 6.9* 7.1* 7.0* 7.2* 7.0* 7.0*  MG 1.5* 1.8 1.3* 1.6* 1.8 1.2*  PHOS 3.8 3.2 3.2  --   --   --    GFR: Estimated Creatinine Clearance: 71.4 mL/min (by C-G formula based on SCr of 0.76 mg/dL). Liver Function Tests:  Recent Labs Lab 09/29/16 0607 09/30/16 0911  AST 23 20  ALT 13* 14  ALKPHOS 660* 489*  BILITOT 1.0 0.9  PROT 4.5* 4.5*  ALBUMIN 1.7*  1.5*   No results for input(s): LIPASE, AMYLASE in the last 168 hours. No results for input(s): AMMONIA in the last 168 hours. Coagulation Profile: No results for input(s): INR, PROTIME in the last 168 hours. Cardiac Enzymes: No results for input(s): CKTOTAL, CKMB, CKMBINDEX, TROPONINI in the last 168 hours. BNP (last 3 results) No results for input(s): PROBNP in the last 8760 hours. HbA1C: No results for input(s): HGBA1C in the last 72 hours. CBG:  Recent Labs Lab 10/04/16 1110 10/04/16 1637 10/04/16 2059 10/05/16 0602 10/05/16 0757  GLUCAP 111* 143* 105* 106* 107*   Lipid Profile: No results for input(s): CHOL, HDL, LDLCALC, TRIG, CHOLHDL, LDLDIRECT in the last 72 hours. Thyroid Function Tests: No results for input(s): TSH, T4TOTAL, FREET4, T3FREE, THYROIDAB in the last 72 hours. Anemia Panel: No results for input(s): VITAMINB12, FOLATE, FERRITIN, TIBC, IRON, RETICCTPCT in the last 72 hours. Sepsis Labs: No results for input(s): PROCALCITON, LATICACIDVEN in the last 168 hours.  No results found for this or any previous visit (from the past 240 hour(s)).       Radiology Studies: No results found.      Scheduled Meds: . ALPRAZolam  0.25 mg Oral QHS  . aspirin EC  81 mg Oral Daily  .  dronabinol  5 mg Oral BID AC  . DULoxetine  60 mg Oral Daily  . feeding supplement (ENSURE ENLIVE)  237 mL Oral TID BM  . feeding supplement (PRO-STAT SUGAR FREE 64)  30 mL Oral TID WC  . feeding supplement (VITAL 1.5 CAL)  1,000 mL Per Tube Q24H  . fluconazole  400 mg Oral Daily  . furosemide  40 mg Intravenous Daily  . insulin aspart  0-5 Units Subcutaneous QHS  . insulin aspart  0-9 Units Subcutaneous TID WC  . lisdexamfetamine  20 mg Oral Daily  . metoprolol tartrate  25 mg Oral BID  . pantoprazole  40 mg Oral QHS  . potassium chloride  40 mEq Oral BID  . sodium chloride flush  3 mL Intravenous Q12H   Continuous Infusions: . magnesium sulfate 1 - 4 g bolus IVPB 4 g  (10/05/16 0953)     LOS: 26 days    Deeya Richeson Tanna Furry, MD [facility] Pager [phone]  If 7PM-7AM, please contact night-coverage [email or web address] Password [email or web address] 10/05/2016, 11:12 AM

## 2016-10-05 NOTE — Progress Notes (Signed)
Patient asked if she could have her prostat through her feeding tube, MD notified, verbal order received to give Prostat through the feeding , will continue to monitor.

## 2016-10-05 NOTE — Progress Notes (Signed)
Patient ID: Haley Graham, female   DOB: [birth year], 61 y.o.   MRN: [id number]  This NP visited patient at the bedside as a follow up for palliative needs and emotional support..  Husband at bedside.  Continued conversation regarding current medical situation. Discussed the seriousness of her situation, the medical complexity and the reality that modern medicine does not always have viable medical interventions to change or improve disease processes.  I was present for visit from Dr Carolin Sicks and Dr Sallyanne Kuster  Patient remains extremely weak and lethargic. Her husband is her main motivation force. Patient herself today tells me that "I think I can do this and get better".   Oral intake still only consists of the sips of soda and milkshakes. She is tolerating TF at 20 cc/hr. Will need to monitor for tolerance and titration.  Cardiology explained the complicated situation from a cardiac standpoint. Plan today is for another echo cardiogram and make decision for further intervention on results.  Patient is encouraged to participate in physical therapy and self active range of motion exercises.     Questions and concerns addressed.  Palliative medicine team will continue to support holistically.  Discussed case with Dr Carolin Sicks, and Dr Shelbie Proctor Time in  0830      Time out  0945  Time spent on the unit was 45 minutes  Greater than 50% of the time was spent in counseling and coordination of care  Wadie Lessen NP  Palliative Medicine Team Team Phone # [phone] Pager [phone]

## 2016-10-05 NOTE — Progress Notes (Addendum)
Physical Therapy Treatment Patient Details Name: Haley Graham MRN: [id number] DOB: [birth year] Today's Date: 10/05/2016    History of Present Illness This 61 y.o. female admitted with sepsis, hypotension, dehydration, amemia, COPD, AKI due to poor oral intake and hypotension/dehydration.  PMH  includes:  Recent prolonged hospitalization for [place] type B aortic dissection s/p descending thoracic aortic stent graft placement; anxiety, GERD, COPD,     PT Comments    Patient tolerated sit to stands X3 and stand pivot from bed to recliner with max A +2. Pt controlled breathing well with minimal cues and less dyspneic with activity. Pt continues to demonstrate willngness to participate and agreeable to mobility.  Pt is very deconditioned and will continue to benefit from further skilled PT services in both acute and post acute settings. Continue to progress as tolerated.   Follow Up Recommendations  SNF     Equipment Recommendations  None recommended by PT    Recommendations for Other Services       Precautions / Restrictions Precautions Precautions: Fall Precaution Comments: cortrak Restrictions Weight Bearing Restrictions: No    Mobility  Bed Mobility Overal bed mobility: Needs Assistance Bed Mobility: Supine to Sit Rolling: Min assist Sidelying to sit: Mod assist;HOB elevated       General bed mobility comments: verbal and tactile cues for sequencing; assist to bring bilat LE to EOB and to elevate trunk into sitting  Transfers Overall transfer level: Needs assistance Equipment used: 2 person hand held assist Transfers: Sit to/from Stand;Stand Pivot Transfers Sit to Stand: Max assist;+2 physical assistance Stand pivot transfers: Max assist;+2 physical assistance       General transfer comment: pt stood X3 and pivoted from EOB to recliner; cues for hand/foot placement and technique; pt able to achieve upright standing with facilitation at glutes and able to take  pivotal steps with less assist; feet blocked when standing for safety  Ambulation/Gait                 Stairs            Wheelchair Mobility    Modified Rankin (Stroke Patients Only)       Balance Overall balance assessment: Needs assistance Sitting-balance support: Bilateral upper extremity supported;Feet supported Sitting balance-Leahy Scale: Fair Sitting balance - Comments: pt sat EOB with min guard for safety   Standing balance support: Bilateral upper extremity supported;During functional activity Standing balance-Leahy Scale: Poor                              Cognition Arousal/Alertness: Awake/alert Behavior During Therapy: Flat affect;WFL for tasks assessed/performed Overall Cognitive Status: Impaired/Different from baseline Area of Impairment: Following commands;Safety/judgement;Problem solving                   Current Attention Level: Selective Memory: Decreased short-term memory Following Commands: Follows one step commands consistently Safety/Judgement: Decreased awareness of safety Awareness: Intellectual Problem Solving: Slow processing;Requires verbal cues        Exercises      General Comments        Pertinent Vitals/Pain Pain Assessment: Faces Faces Pain Scale: Hurts little more Pain Location: abdomen and low back Pain Descriptors / Indicators: Cramping;Sore Pain Intervention(s): Monitored during session;Repositioned    Home Living                      Prior Function  PT Goals (current goals can now be found in the care plan section) Acute Rehab PT Goals PT Goal Formulation: With patient Time For Goal Achievement: 09/03/2016 Potential to Achieve Goals: Fair Progress towards PT goals: Progressing toward goals    Frequency    Min 2X/week      PT Plan Current plan remains appropriate    Co-evaluation              AM-PAC PT "6 Clicks" Daily Activity  Outcome Measure   Difficulty turning over in bed (including adjusting bedclothes, sheets and blankets)?: Unable Difficulty moving from lying on back to sitting on the side of the bed? : Unable Difficulty sitting down on and standing up from a chair with arms (e.g., wheelchair, bedside commode, etc,.)?: Unable Help needed moving to and from a bed to chair (including a wheelchair)?: A Lot Help needed walking in hospital room?: Total Help needed climbing 3-5 steps with a railing? : Total 6 Click Score: 7    End of Session Equipment Utilized During Treatment: Gait belt Activity Tolerance: Patient tolerated treatment well Patient left: with call bell/phone within reach;in chair;with family/visitor present Nurse Communication: Mobility status PT Visit Diagnosis: Muscle weakness (generalized) (M62.81);Other abnormalities of gait and mobility (R26.89);Unsteadiness on feet (R26.81);Adult, failure to thrive (R62.7)     Time: 1209-1239 PT Time Calculation (min) (ACUTE ONLY): 30 min  Charges:  $Therapeutic Activity: 23-37 mins                    G Codes:       Earney Navy, PTA Pager: [phone]     Darliss Cheney 10/05/2016, 2:43 PM

## 2016-10-05 NOTE — Progress Notes (Signed)
Progress Note  Patient Name: Haley Graham Date of Encounter: 10/05/2016  Primary Cardiologist: New  Subjective   No chest pain and no dyspnea at rest. Spirits "OK", definitely better than last week.  Inpatient Medications    Scheduled Meds: . ALPRAZolam  0.25 mg Oral QHS  . aspirin EC  81 mg Oral Daily  . dronabinol  5 mg Oral BID AC  . DULoxetine  60 mg Oral Daily  . feeding supplement (ENSURE ENLIVE)  237 mL Oral TID BM  . feeding supplement (PRO-STAT SUGAR FREE 64)  30 mL Oral TID WC  . feeding supplement (VITAL 1.5 CAL)  1,000 mL Per Tube Q24H  . fluconazole  400 mg Oral Daily  . furosemide  40 mg Intravenous Daily  . insulin aspart  0-5 Units Subcutaneous QHS  . insulin aspart  0-9 Units Subcutaneous TID WC  . lisdexamfetamine  20 mg Oral Daily  . metoprolol tartrate  25 mg Oral BID  . pantoprazole  40 mg Oral QHS  . potassium chloride  40 mEq Oral BID  . sodium chloride flush  3 mL Intravenous Q12H   Continuous Infusions: . magnesium sulfate 1 - 4 g bolus IVPB     PRN Meds: acetaminophen **OR** acetaminophen, alum & mag hydroxide-simeth, fluticasone, ipratropium-albuterol, lidocaine, loperamide, menthol-cetylpyridinium, ondansetron (ZOFRAN) IV, senna-docusate, sodium phosphate, traMADol   Vital Signs    Vitals:   10/04/16 1600 10/04/16 2000 10/05/16 0000 10/05/16 0805  BP: (!) 113/91 (!) 110/59 103/69 111/63  Pulse: 99 98  100  Resp: 18 16 16 18   Temp: 98.9 F (37.2 C) 98.4 F (36.9 C)  98 F (36.7 C)  TempSrc: Oral Oral  Oral  SpO2:  100% 100% 100%  Weight:      Height:        Intake/Output Summary (Last 24 hours) at 10/05/16 0851 Last data filed at 10/04/16 2117  Gross per 24 hour  Intake              120 ml  Output              700 ml  Net             -580 ml   Filed Weights   10/01/16 0517 09/29/2016 0413 10/04/16 0511  Weight: 152 lb 8 oz (69.2 kg) 150 lb (68 kg) 160 lb (72.6 kg)    Telemetry    NSR - Personally Reviewed  ECG      No new tracing since 10/03/2016 - Personally Reviewed  Physical Exam  Calm GEN: No acute distress.   Neck: No JVD Cardiac: RRR, no murmurs, rubs, or gallops.  Respiratory: Clear to auscultation bilaterally. GI: Soft, nontender, non-distended  MS: No edema; No deformity. Neuro:  Nonfocal  Psych: Normal affect   Labs    Chemistry Recent Labs Lab 09/29/16 [phone] 09/30/16 0911  09/25/2016 0352 10/03/16 0547 10/05/16 0251  NA 139 137  < > 137 135 137  K 3.6 3.4*  < > 3.8 4.5 3.6  CL 107 105  < > 106 103 103  CO2 25 25  < > 24 22 27   GLUCOSE 127* 95  < > 89 76 93  BUN 15 15  < > 11 10 8   CREATININE 0.86 0.89  < > 0.86 0.79 0.76  CALCIUM 6.9* 7.1*  < > 7.2* 7.0* 7.0*  PROT 4.5* 4.5*  --   --   --   --   ALBUMIN 1.7*  1.5*  --   --   --   --   AST 23 20  --   --   --   --   ALT 13* 14  --   --   --   --   ALKPHOS 660* 489*  --   --   --   --   BILITOT 1.0 0.9  --   --   --   --   GFRNONAA >60 >60  < > >60 >60 >60  GFRAA >60 >60  < > >60 >60 >60  ANIONGAP 7 7  < > 7 10 7   < > = values in this interval not displayed.   Hematology Recent Labs Lab 09/22/2016 0502 10/03/16 0547 10/05/16 0251  WBC 5.8 6.0 5.2  RBC 3.56* 3.05* 3.47*  HGB 10.8* 9.7* 10.9*  HCT 34.4* 29.8* 34.2*  MCV 96.6 97.7 98.6  MCH 30.3 31.8 31.4  MCHC 31.4 32.6 31.9  RDW 20.2* 20.8* 20.5*  PLT 92* 81* 81*     Radiology    No results found.  Cardiac Studies   ECHO  09/29/2016  - Left ventricle: The cavity size was normal. Wall thickness wasnormal. Systolic function was moderately to severely reduced. Theestimated ejection fraction was in the range of 30% to 35%. Thereis akinesis of the mid-apicalanteroseptal and anterior myocardium. Doppler parameters are consistent with abnormal leftventricular relaxation (grade 1 diastolic dysfunction). - Aortic valve: Trileaflet; mildly thickened, mildly calcifiedleaflets. - Mitral valve: Calcified annulus. There was mild  regurgitation.  Impressions:  - EF is reduced when compared to prior study  Patient Profile     61 y.o. 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 Heart failure symptoms resolved promptly with diuretic therapy.  Assessment & Plan    1. CHF/Cardiomyopathy: EF was normal just 2 weeks ago. Differential diagnosis includes coronary insufficiency due to CAD, proximal extension of her dissection with involvement of the left coronary artery (not seen on CT 8/26) and takotsubo syndrome. Heart rate still too fast for coronary CTA. Invasive evaluation a problem due to thoracic aortic stent struts up to the innominate artery origin and anemia (although Dr. Trula Slade and Donzetta Matters believe can safely negotiate around the stent with coronary catheters).Check echo today. If EF and wall motion improved/normalized, plan a Lexiscan Myoview. If not, will need cardiac cath. 2. Ao dissection: Chest CT August 26 does not show dissection the proximal ascending aorta. Note that the covered portion of the aortic stent graft now overlaps the ostium of the left subclavian artery. (There has been some degree of stent migration, probably?). The patient's blood pressure should be checked only in the right upper extremity. On metoprolol. Blood pressure should be checked only in the right upper extremity. 3. Anemia: Stable since transfusion 10/01/2016. Macrocytic, hyporegenerative, no evidence of iron or vitamin deficiency, associated with  thrombocytopenia. Consider bone marrow insufficiency and recommend hematology consultation. 4. Hemoccult-positive stools: While GI bleeding may be contributing to the anemia, I don't think it's the only reason for it. Mesenteric ischemia from the aortic dissection may be causing this. GI  recommendations are to use proton pump inhibitors.  5. QT prolongation: Could be explained either by coronary insufficiency or by stress cardiomyopathy. After almost normalizing on 10/01/16, markedly prolonged on 9/1.  Hypokalemia is playing a role. Avoid agents that can prolong the QT interval further. This includes Reglan which was suggested by gastroenterology and Abilify which has been stopped by psychiatry. Suspect the QT interval may return to normal as early as tomorrow. Fluconazole necessary for fungemia - will recheck ECG today and if still prolonged substantially will ask ID if there are any reasonable alternatives.  Very complex patient, with multiple severe and life-threatening conditions. Prognosis is very guarded.   Signed, Sanda Klein, MD  10/05/2016, 8:51 AM

## 2016-10-06 ENCOUNTER — Inpatient Hospital Stay (HOSPITAL_COMMUNITY): Payer: BLUE CROSS/BLUE SHIELD

## 2016-10-06 DIAGNOSIS — I341 Nonrheumatic mitral (valve) prolapse: Secondary | ICD-10-CM

## 2016-10-06 DIAGNOSIS — R9431 Abnormal electrocardiogram [ECG] [EKG]: Secondary | ICD-10-CM

## 2016-10-06 DIAGNOSIS — I5032 Chronic diastolic (congestive) heart failure: Secondary | ICD-10-CM

## 2016-10-06 LAB — BASIC METABOLIC PANEL
ANION GAP: 7 (ref 5–15)
BUN: 13 mg/dL (ref 6–20)
CHLORIDE: 101 mmol/L (ref 101–111)
CO2: 29 mmol/L (ref 22–32)
Calcium: 6.9 mg/dL — ABNORMAL LOW (ref 8.9–10.3)
Creatinine, Ser: 0.87 mg/dL (ref 0.44–1.00)
GFR calc Af Amer: >60 mL/min (ref >60)
GFR calc non Af Amer: >60 mL/min (ref >60)
GLUCOSE: 115 mg/dL — AB (ref 65–99)
POTASSIUM: 3.3 mmol/L — AB (ref 3.5–5.1)
Sodium: 137 mmol/L (ref 135–145)

## 2016-10-06 LAB — MAGNESIUM: Magnesium: 1.5 mg/dL — ABNORMAL LOW (ref 1.7–2.4)

## 2016-10-06 LAB — CBC
HEMATOCRIT: 32 % — AB (ref 36.0–46.0)
HEMOGLOBIN: 10.1 g/dL — AB (ref 12.0–15.0)
MCH: 30.8 pg (ref 26.0–34.0)
MCHC: 31.6 g/dL (ref 30.0–36.0)
MCV: 97.6 fL (ref 78.0–100.0)
Platelets: 75 10*3/uL — ABNORMAL LOW (ref 150–400)
RBC: 3.28 MIL/uL — AB (ref 3.87–5.11)
RDW: 20.1 % — ABNORMAL HIGH (ref 11.5–15.5)
WBC: 5.7 10*3/uL (ref 4.0–10.5)

## 2016-10-06 LAB — GLUCOSE, CAPILLARY
GLUCOSE-CAPILLARY: 99 mg/dL (ref 65–99)
GLUCOSE-CAPILLARY: 106 mg/dL — AB (ref 65–99)
GLUCOSE-CAPILLARY: 121 mg/dL — AB (ref 65–99)
Glucose-Capillary: 100 mg/dL — ABNORMAL HIGH (ref 65–99)
Glucose-Capillary: 112 mg/dL — ABNORMAL HIGH (ref 65–99)
Glucose-Capillary: 124 mg/dL — ABNORMAL HIGH (ref 65–99)
Glucose-Capillary: 125 mg/dL — ABNORMAL HIGH (ref 65–99)

## 2016-10-06 LAB — ECHOCARDIOGRAM LIMITED
Height: 63 in
WEIGHTICAEL: 2316.8 [oz_av]

## 2016-10-06 MED ORDER — MAGNESIUM SULFATE 2 GM/50ML IV SOLN
2.0000 g | Freq: Once | INTRAVENOUS | Status: AC
Start: 2016-10-06 — End: 2016-10-06
  Administered 2016-10-06: 2 g via INTRAVENOUS
  Filled 2016-10-06: qty 50

## 2016-10-06 MED ORDER — POTASSIUM CHLORIDE 20 MEQ/15ML (10%) PO SOLN
40.0000 meq | Freq: Two times a day (BID) | ORAL | Status: DC
  Administered 2016-10-06 – 2016-10-10 (×6): 40 meq via ORAL
  Filled 2016-10-06 (×9): qty 30

## 2016-10-06 NOTE — Progress Notes (Signed)
PROGRESS NOTE    Haley Graham  LOV:[id number] DOB: October 24, 1955 DOA: 09/12/2016 PCP: Glenda Chroman, MD   Brief Narrative: 61 y.o.femalewith medical history significant of [place] type B aortic dissection status post descending thoracic aortic stent graft placement per vascular surgery during last hospitalization from 06/20/2016-08/22/2016, a history of diabetes mellitus with gastroparesis, gastroesophageal reflux disease, hypertension, IBS, abdominal aortic aneurysm, anxiety, depression presented with generalized weakness and sepsis on admission. The blood culture grwing is to therefore started on fluconazole for candidemia. Evaluated by ID. ID recommended continuing fluconazole 400 mg daily through 9/26. Patient now with congestive heart failure, hypoxia and echo showed reduced EF of 30-35% with akinesis of mid-apicalanteroseptal and anterior myocardium.  Assessment & Plan:  # Sepsis due to candidemia: -Continue fluconazole until September 26 and needs follow-up with ophthalmologist in a month as per infectious disease.  #Acute systolic congestive heart failure:  -Currently on IV Lasix. Plan for repeat echocardiogram today. Depending on echo finding patient may need Lexiscan Myoview versus cath. Denied shortness of breath or chest pain today. Continue to monitor urine output and electrolytes. Cardiology consult appreciated.  #Prolonged QTC: Discontinue Azithromycin and abilify. Monitor in telemetry. Change zofran to q4hr as needed from standing order. Repeat EKG.  #Severe protein calorie malnutrition: Patient is on NG tube because of decreased oral intake and severe malnutrition. Evaluated by  swallow team. Currently on tube feeding, encourage oral intake. Palliative care is following.  #Acute kidney injury resolved  #Acute on Chronic anemia likely due to chronic disease vs acute GI bleed: Iron stores acceptable. Fecal occult blood test positive. On Protonix. Status post 2 unit of red  blood cell transfusion on 8/30 with improvement in hemoglobin. Evaluated by GI. No further endoscopy at this time. Hemoglobin level stable. -Monitor CBC.  #Nausea and abdominal discomfort: Abdominal x-ray consistent with adynamic ileus. Evaluated by GI. Patient is tolerating tube feeds. Continue Zofran and symptomatic treatment. Unable to take reglan because of risk of QT prolongation. Clinically stable and denies abdominal pain today.  #Thrombocytopenia likely due to acute illness: No sign of bleeding. Monitor CBC. Platelet count 75. Monitor labs  #Anxiety depression: Evaluated by psychiatrist. -Continue Xanax, Cymbalta and Vyvanse per psychiatrist  # hypokalemia/hypomagnesemia: Repleted magnesium and potassium chloride. Monitor labs.   DVT prophylaxis: SCD. Patient with anemia and thrombocytopenia Code Status: Full code Family Communication: No family member at bedside today. Disposition Plan: Currently admitted, likely discharge to skilled facility in 3-4 days    Consultants:   Infectious disease  Psychiatrist  Palliative care  Cardiologist  GI  Procedures:  Thoracentesis 8/17  Echo 8/28 EF 30-35%, akinesis of mid-apicalanteroseptal and anterior myocardium.  Diastolic dysfunction.  Echo 8/13 EF 88-41, diastolic dysfunction, lipomatous hypertrophy Antimicrobials: Fluconazole Subjective: Seen and examined at bedside. Denied nausea vomiting chest pain, shortness of breath or abdominal pain. Still has very poor oral intake Objective: Vitals:   10/06/16 0545 10/06/16 0600 10/06/16 0750 10/06/16 1100  BP:   (!) 104/54 116/71  Pulse:      Resp: 19 19 16 20   Temp:   98.4 F (36.9 C)   TempSrc:   Oral   SpO2: 93% 96% 100% 97%  Weight:  65.7 kg (144 lb 12.8 oz)    Height:  5\' 3"  (1.6 m)      Intake/Output Summary (Last 24 hours) at 10/06/16 1200 Last data filed at 10/06/16 0800  Gross per 24 hour  Intake  1780 ml  Output             1970 ml  Net              -190 ml   Filed Weights   09/15/2016 0413 10/04/16 0511 10/06/16 0600  Weight: 68 kg (150 lb) 72.6 kg (160 lb) 65.7 kg (144 lb 12.8 oz)    Examination:  General exam: Not in distress, lying in bed with feeding tube Respiratory system: Bibasal decreased,respiratoryeffortnormal Cardiovascular system: Regular rate rhythm, S1-S2 normal. Trace lower extremities edema Gastrointestinal system: Abdomen soft, nontender, mild distention unchanged. Bowel sound positive Central nervous system: Alert and oriented. No focal neurological deficits. Skin: No rashes, lesions or ulcers Psychiatry: Judgement and insight appear normal. Mood & affect appropriate.     Data Reviewed: I have personally reviewed following labs and imaging studies  CBC:  Recent Labs Lab 10/01/16 0424 09/28/2016 0502 10/03/16 0547 10/05/16 0251 10/06/16 0939  WBC 5.5 5.8 6.0 5.2 5.7  HGB 6.9* 10.8* 9.7* 10.9* 10.1*  HCT 21.9* 34.4* 29.8* 34.2* 32.0*  MCV 101.4* 96.6 97.7 98.6 97.6  PLT 99* 92* 81* 81* 75*   Basic Metabolic Panel:  Recent Labs Lab 09/30/16 0911 10/01/16 0424 09/08/2016 0352 10/03/16 0547 10/05/16 0251 10/06/16 0939  NA 137 137 137 135 137 137  K 3.4* 2.9* 3.8 4.5 3.6 3.3*  CL 105 105 106 103 103 101  CO2 25 26 24 22 27 29   GLUCOSE 95 90 89 76 93 115*  BUN 15 12 11 10 8 13   CREATININE 0.89 0.88 0.86 0.79 0.76 0.87  CALCIUM 7.1* 7.0* 7.2* 7.0* 7.0* 6.9*  MG 1.8 1.3* 1.6* 1.8 1.2* 1.5*  PHOS 3.2 3.2  --   --   --   --    GFR: Estimated Creatinine Clearance: 62.6 mL/min (by C-G formula based on SCr of 0.87 mg/dL). Liver Function Tests:  Recent Labs Lab 09/30/16 0911  AST 20  ALT 14  ALKPHOS 489*  BILITOT 0.9  PROT 4.5*  ALBUMIN 1.5*   No results for input(s): LIPASE, AMYLASE in the last 168 hours. No results for input(s): AMMONIA in the last 168 hours. Coagulation Profile: No results for input(s): INR, PROTIME in the last 168 hours. Cardiac Enzymes: No results for input(s):  CKTOTAL, CKMB, CKMBINDEX, TROPONINI in the last 168 hours. BNP (last 3 results) No results for input(s): PROBNP in the last 8760 hours. HbA1C: No results for input(s): HGBA1C in the last 72 hours. CBG:  Recent Labs Lab 10/05/16 2249 10/06/16 0008 10/06/16 0455 10/06/16 0747 10/06/16 1139  GLUCAP 105* 112* 106* 121* 124*   Lipid Profile: No results for input(s): CHOL, HDL, LDLCALC, TRIG, CHOLHDL, LDLDIRECT in the last 72 hours. Thyroid Function Tests: No results for input(s): TSH, T4TOTAL, FREET4, T3FREE, THYROIDAB in the last 72 hours. Anemia Panel: No results for input(s): VITAMINB12, FOLATE, FERRITIN, TIBC, IRON, RETICCTPCT in the last 72 hours. Sepsis Labs: No results for input(s): PROCALCITON, LATICACIDVEN in the last 168 hours.  No results found for this or any previous visit (from the past 240 hour(s)).       Radiology Studies: No results found.      Scheduled Meds: . ALPRAZolam  0.25 mg Oral QHS  . aspirin EC  81 mg Oral Daily  . dronabinol  5 mg Oral BID AC  . DULoxetine  60 mg Oral Daily  . feeding supplement (ENSURE ENLIVE)  237 mL Oral TID BM  . feeding supplement (PRO-STAT SUGAR FREE  64)  30 mL Per Tube TID WC  . feeding supplement (VITAL 1.5 CAL)  1,000 mL Per Tube Q24H  . fluconazole  400 mg Oral Daily  . furosemide  40 mg Intravenous Daily  . insulin aspart  0-5 Units Subcutaneous QHS  . insulin aspart  0-9 Units Subcutaneous TID WC  . lisdexamfetamine  20 mg Oral Daily  . metoprolol tartrate  25 mg Oral BID  . pantoprazole  40 mg Oral QHS  . potassium chloride  40 mEq Oral BID  . sodium chloride flush  3 mL Intravenous Q12H   Continuous Infusions: . magnesium sulfate 1 - 4 g bolus IVPB       LOS: 27 days    Dron Tanna Furry, MD [facility] Pager [phone]  If 7PM-7AM, please contact night-coverage [email or web address] Password [email or web address] 10/06/2016, 12:00 PM

## 2016-10-06 NOTE — Progress Notes (Signed)
Occupational Therapy Treatment Patient Details Name: Haley Graham MRN: [id number] DOB: [birth year] Today's Date: 10/06/2016    History of present illness This 61 y.o. female admitted with sepsis, hypotension, dehydration, amemia, COPD, AKI due to poor oral intake and hypotension/dehydration.  PMH  includes:  Recent prolonged hospitalization for [place] type B aortic dissection s/p descending thoracic aortic stent graft placement; anxiety, GERD, COPD,    OT comments  Pt seen as co-treat for partial session with PT. Pt anxious initially, however participated with therapy to get back to bed form BSC using Stedy. After returning to bed, pt completed grooming task at bed level with set up. Appears to demonstrate increased participation with therapy today. BP 116/71. HR 98 (supine in bed after activity).   Follow Up Recommendations  SNF    Equipment Recommendations  None recommended by OT    Recommendations for Other Services      Precautions / Restrictions Precautions Precautions: Fall Precaution Comments: cortrak       Mobility Bed Mobility Overal bed mobility: Needs Assistance Bed Mobility: Sit to Supine       Sit to supine: +2 for physical assistance;Mod assist   General bed mobility comments: Two person mod assist to return to supine.  Pt assisting to control trunk and needed most help lifting legs up into bed.   Transfers Overall transfer level: Needs assistance Equipment used: 2 person hand held assist Denna Haggard) Transfers: Sit to/from Stand Sit to Stand: +2 physical assistance;Mod assist Stand pivot transfers: +2 physical assistance       General transfer comment: Assist to stand from lower BSC with two person mod assist supporting trunk and manual facilitation at hips to power up using the standing frame.  Pt stood for ~15 seconds before reporting fatigue on the standing, so we sat on the stander to transfer back to bed.  Once standing only min assist to maintain  standing, however, fatigued quickly.     Balance Overall balance assessment: Needs assistance Sitting-balance support: Feet supported;Bilateral upper extremity supported Sitting balance-Leahy Scale: Fair Sitting balance - Comments: Min guard assist EOB to maintain sitting balance with bil UE supported.    Standing balance support: Bilateral upper extremity supported Standing balance-Leahy Scale: Poor Standing balance comment: min assist once standing with poor physical tolerance of standing before she needed to sit ~15 seconds.                            ADL either performed or assessed with clinical judgement   ADL Overall ADL's : Needs assistance/impaired     Grooming: Set up;Bed level Grooming Details (indicate cue type and reason): wash face/hands                 Toilet Transfer: +2 for physical assistance;BSC;Moderate assistance Toilet Transfer Details (indicate cue type and reason): Pt sitting on BSC - nsg had transferred pt to [facility]. Clarise Cruz stedy used to help pt off BSC. Pt able to upull self to stand with mod A. Able to maintain standing in [place] with min A. +2 for safety.                  Vision       Perception     Praxis      Cognition Arousal/Alertness: Awake/alert Behavior During Therapy: Flat affect;Anxious Overall Cognitive Status: Impaired/Different from baseline Area of Impairment: Following commands;Safety/judgement;Problem solving  Current Attention Level: Selective Memory: Decreased short-term memory Following Commands: Follows one step commands consistently Safety/Judgement: Decreased awareness of safety Awareness: Emergent Problem Solving: Slow processing;Requires verbal cues General Comments: Not specifically assessed, but generally seemed confused.          Exercises     Shoulder Instructions       General Comments BPs soft in supine, likely lower with prolonged sitting on BSC.  checked after  return to bed. Asked by RN and RN tech to help pt return to bed, two person skilled hands needed to safely complete transfer when other staff could not successfully complete.  They asked [place] for help.     Pertinent Vitals/ Pain       Pain Assessment: Faces Faces Pain Scale: Hurts little more Pain Location: generalized Pain Descriptors / Indicators: Grimacing;Guarding Pain Intervention(s): Limited activity within patient's tolerance  Home Living                                          Prior Functioning/Environment              Frequency  Min 2X/week        Progress Toward Goals  OT Goals(current goals can now be found in the care plan section)  Progress towards OT goals: Progressing toward goals  Acute Rehab OT Goals Patient Stated Goal: to get back to  bed OT Goal Formulation: With patient Time For Goal Achievement: 10/13/16 Potential to Achieve Goals: Fair ADL Goals Pt Will Perform Grooming: with min assist;sitting Pt Will Perform Upper Body Bathing: with min assist;sitting Pt Will Perform Lower Body Bathing: with mod assist;sit to/from stand Pt Will Perform Upper Body Dressing: with mod assist;sitting Pt Will Perform Lower Body Dressing: with max assist;sit to/from stand Pt Will Transfer to Toilet: with min assist;stand pivot transfer;bedside commode Pt Will Perform Toileting - Clothing Manipulation and hygiene: with mod assist;sit to/from stand  Plan Discharge plan remains appropriate    Co-evaluation    PT/OT/SLP Co-Evaluation/Treatment: Yes Reason for Co-Treatment: For patient/therapist safety;To address functional/ADL transfers PT goals addressed during session: Mobility/safety with mobility;Balance OT goals addressed during session: ADL's and self-care      AM-PAC PT "6 Clicks" Daily Activity     Outcome Measure   Help from another person eating meals?: A Lot Help from another person taking care of personal grooming?: A Little Help  from another person toileting, which includes using toliet, bedpan, or urinal?: A Lot Help from another person bathing (including washing, rinsing, drying)?: A Lot Help from another person to put on and taking off regular upper body clothing?: A Lot Help from another person to put on and taking off regular lower body clothing?: Total 6 Click Score: 12    End of Session Equipment Utilized During Treatment: Gait belt  OT Visit Diagnosis: Unsteadiness on feet (R26.81);Muscle weakness (generalized) (M62.81);Pain Pain - part of body:  (generalized)   Activity Tolerance Patient tolerated treatment well   Patient Left in bed;with call bell/phone within reach;with bed alarm set;with family/visitor present   Nurse Communication Mobility status        Time: 1050-1110 OT Time Calculation (min): 20 min  Charges: OT General Charges $OT Visit: 1 Visit OT Treatments $Self Care/Home Management : 8-22 mins  [facility], OT/L  [phone] 10/06/2016   Tasheema Perrone,HILLARY 10/06/2016, 1:49 PM

## 2016-10-06 NOTE — Progress Notes (Signed)
  Echocardiogram 2D Echocardiogram limited has been performed.  Jennette Dubin 10/06/2016, 4:20 PM

## 2016-10-06 NOTE — Progress Notes (Addendum)
Progress Note  Patient Name: Haley Graham Date of Encounter: 10/06/2016  Primary Cardiologist: new - Dr. Sallyanne Kuster  Subjective   Pt denies chest pain. DHT in place for nutrition 2/2 poor PO intake.  Inpatient Medications    Scheduled Meds: . ALPRAZolam  0.25 mg Oral QHS  . aspirin EC  81 mg Oral Daily  . dronabinol  5 mg Oral BID AC  . DULoxetine  60 mg Oral Daily  . feeding supplement (ENSURE ENLIVE)  237 mL Oral TID BM  . feeding supplement (PRO-STAT SUGAR FREE 64)  30 mL Per Tube TID WC  . feeding supplement (VITAL 1.5 CAL)  1,000 mL Per Tube Q24H  . fluconazole  400 mg Oral Daily  . furosemide  40 mg Intravenous Daily  . insulin aspart  0-5 Units Subcutaneous QHS  . insulin aspart  0-9 Units Subcutaneous TID WC  . lisdexamfetamine  20 mg Oral Daily  . metoprolol tartrate  25 mg Oral BID  . pantoprazole  40 mg Oral QHS  . potassium chloride  40 mEq Oral BID  . sodium chloride flush  3 mL Intravenous Q12H   Continuous Infusions: . magnesium sulfate 1 - 4 g bolus IVPB     PRN Meds: acetaminophen **OR** acetaminophen, alum & mag hydroxide-simeth, fluticasone, ipratropium-albuterol, lidocaine, loperamide, menthol-cetylpyridinium, ondansetron (ZOFRAN) IV, senna-docusate, sodium phosphate, traMADol   Vital Signs    Vitals:   10/06/16 0545 10/06/16 0600 10/06/16 0750 10/06/16 1100  BP:   (!) 104/54 116/71  Pulse:      Resp: 19 19 16 20   Temp:   98.4 F (36.9 C)   TempSrc:   Oral   SpO2: 93% 96% 100% 97%  Weight:  144 lb 12.8 oz (65.7 kg)    Height:  5\' 3"  (1.6 m)      Intake/Output Summary (Last 24 hours) at 10/06/16 1211 Last data filed at 10/06/16 0800  Gross per 24 hour  Intake             1780 ml  Output             1970 ml  Net             -190 ml   Filed Weights   09/24/2016 0413 10/04/16 0511 10/06/16 0600  Weight: 150 lb (68 kg) 160 lb (72.6 kg) 144 lb 12.8 oz (65.7 kg)     Physical Exam   General: Well developed, well nourished, female  appearing in no acute distress, at times seems confused during interview. Head: Normocephalic, atraumatic.  Neck: Supple without bruits, JVD. Lungs:  Resp regular and unlabored, CTA. Heart: RRR, S1, S2, no S3, S4, or murmur; no rub. Abdomen: Soft, non-tender, non-distended with normoactive bowel sounds. No hepatomegaly. No rebound/guarding. No obvious abdominal masses. Extremities: No clubbing, cyanosis, trace edema. Distal pedal pulses are 1+ bilaterally. Extensive bruising Neuro: Alert,  Moves all extremities spontaneously. Psych: Normal affect.  Labs    Chemistry Recent Labs Lab 09/30/16 0911  10/03/16 0547 10/05/16 0251 10/06/16 0939  NA 137  < > 135 137 137  K 3.4*  < > 4.5 3.6 3.3*  CL 105  < > 103 103 101  CO2 25  < > 22 27 29   GLUCOSE 95  < > 76 93 115*  BUN 15  < > 10 8 13   CREATININE 0.89  < > 0.79 0.76 0.87  CALCIUM 7.1*  < > 7.0* 7.0* 6.9*  PROT 4.5*  --   --   --   --  ALBUMIN 1.5*  --   --   --   --   AST 20  --   --   --   --   ALT 14  --   --   --   --   ALKPHOS 489*  --   --   --   --   BILITOT 0.9  --   --   --   --   GFRNONAA >60  < > >60 >60 >60  GFRAA >60  < > >60 >60 >60  ANIONGAP 7  < > 10 7 7   < > = values in this interval not displayed.   Hematology Recent Labs Lab 10/03/16 0547 10/05/16 0251 10/06/16 0939  WBC 6.0 5.2 5.7  RBC 3.05* 3.47* 3.28*  HGB 9.7* 10.9* 10.1*  HCT 29.8* 34.2* 32.0*  MCV 97.7 98.6 97.6  MCH 31.8 31.4 30.8  MCHC 32.6 31.9 31.6  RDW 20.8* 20.5* 20.1*  PLT 81* 81* 75*    Cardiac EnzymesNo results for input(s): TROPONINI in the last 168 hours. No results for input(s): TROPIPOC in the last 168 hours.   BNPNo results for input(s): BNP, PROBNP in the last 168 hours.   DDimer No results for input(s): DDIMER in the last 168 hours.   Radiology    No results found.   Telemetry    Sinus rhythm - Personally Reviewed  ECG    Sinus rhythm, TWI improving - Personally Reviewed   Cardiac Studies    Echocardiogram 09/29/16: Study Conclusions - Left ventricle: The cavity size was normal. Wall thickness was   normal. Systolic function was moderately to severely reduced. The   estimated ejection fraction was in the range of 30% to 35%. There   is akinesis of the mid-apicalanteroseptal and anterior   myocardium. Doppler parameters are consistent with abnormal left   ventricular relaxation (grade 1 diastolic dysfunction). - Aortic valve: Trileaflet; mildly thickened, mildly calcified   leaflets. - Mitral valve: Calcified annulus. There was mild regurgitation.  Impressions: - EF is reduced when compared to prior study.  Patient Profile     61 y.o. 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 Heart failure symptoms resolved promptly with diuretic therapy.  Assessment & Plan    1. Cardiomyopathy, acute on chronic combined systolic and diastolic heart failure - echo with newly reduced LVEF to 30-35% - EKGs with diffuse TWI - thought to be related to takotsubo vs proximal extension of her dissection with involvement of left coronary artery (not appreciated on CT scan 8/26) - plan to repeat echo next week - if LVEF still low, will consider ischemic evaluation with cardiac catheterization, although difficult given her thoracic artery stent (placed by vascular 06/2016) - continues on lasix 40 mg IV daily - she denies chest pain  2. Anemia - Hb stable 10.1 (10.9), no signs of bleeding  3. QT  prolongation - QTc on recent EKG 518 ms - avoid QTc prolonging agents, when possible - on diflucan for candidemia - will defer to ID  4. Hypomagnesmia - Mg 1.2 yesterday, replaced with IV Mg -  repeat Mg today, will likely need more replacement - NG tube in place  5. Thrombocytopenia - monitor, no SQ heparin   Signed, Ledora Bottcher , PA-C 12:11 PM 10/06/2016 Pager: [phone]  Personally seen and examined. Agree with above.  Type B aortic dissection  - thoracic stent graft  Cardiomyopathy  - EF 30%, new, ?etiol, perhaps stress induced. No CP.   - Continue to treat aggressively medically  - In future, could pursue NUC stress to evaluate ischemic burden.   - Lasix 40 IV BID  - Repeating limited ECHO   QT prolongation  - trying to avoid prolonging meds  Hypomag  - agree with IV repl  Candidiasis sepsis  - fluconazole  Delirium  - mild fatigue, confusion, tried to pull out Panda tube last night.   - spoke with husband.   - per hospitalist team  Complex, multisystem illness.   Candee Furbish, MD

## 2016-10-06 NOTE — Progress Notes (Signed)
Physical Therapy Treatment Patient Details Name: Haley Graham MRN: [id number] DOB: Feb 12, 1955 Today's Date: 10/06/2016    History of Present Illness This 61 y.o. female admitted with sepsis, hypotension, dehydration, amemia, COPD, AKI due to poor oral intake and hypotension/dehydration.  PMH  includes:  Recent prolonged hospitalization for [facility] type B aortic dissection s/p descending thoracic aortic stent graft placement; anxiety, GERD, COPD,     PT Comments    Called in by RN staff to assist pt back to bed when they were unable to safely.  Pt is very fatigued and may have had softer BPs upright compared to supine in the bed.  Two person mod assist to stand and transfer back to bed with the steady standing frame.  Pt is very deconditioned and weak with poor activity tolerance.  She would benefit from post acute rehab when she is stable enough to d/c.  PT will continue to follow acutely.  Goals re-assessed today.   Follow Up Recommendations  SNF     Equipment Recommendations  None recommended by PT    Recommendations for Other Services   NA     Precautions / Restrictions Precautions Precautions: Fall Precaution Comments: cortrak    Mobility  Bed Mobility Overal bed mobility: Needs Assistance Bed Mobility: Sit to Supine       Sit to supine: +2 for physical assistance;Mod assist   General bed mobility comments: Two person mod assist to return to supine.  Pt assisting to control trunk and needed most help lifting legs up into bed.   Transfers Overall transfer level: Needs assistance   Transfers: Sit to/from Stand;Stand Pivot Transfers Sit to Stand: +2 physical assistance;Mod assist Stand pivot transfers: +2 physical assistance (used Filiberto Pinks)       General transfer comment: Assist to stand from lower [facility] with two person mod assist supporting trunk and manual facilitation at hips to power up using the standing frame.  Pt stood for ~15 seconds before reporting  fatigue on the standing, so we sat on the stander to transfer back to bed.  Once standing only min assist to maintain standing, however, fatigued quickly.          Balance Overall balance assessment: Needs assistance Sitting-balance support: Feet supported;Bilateral upper extremity supported Sitting balance-Leahy Scale: Poor Sitting balance - Comments: Min guard assist EOB to maintain sitting balance with bil UE supported.    Standing balance support: Bilateral upper extremity supported Standing balance-Leahy Scale: Poor Standing balance comment: min assist once standing with poor physical tolerance of standing before she needed to sit ~15 seconds.                             Cognition Arousal/Alertness: Awake/alert Behavior During Therapy: Flat affect;Anxious Overall Cognitive Status: Impaired/Different from baseline                                 General Comments: Not specifically assessed, but generally seemed confused.           General Comments General comments (skin integrity, edema, etc.): BPs soft in supine, likely lower with prolonged sitting on BSC.  checked after return to bed. Asked by RN and RN tech to help pt return to bed, two person skilled hands needed to safely complete transfer when other staff could not successfully complete.  They asked [place] for help.  Pertinent Vitals/Pain Pain Assessment: Faces Faces Pain Scale: Hurts little more Pain Location: generalized Pain Descriptors / Indicators: Grimacing;Guarding Pain Intervention(s): Limited activity within patient's tolerance;Monitored during session;Repositioned           PT Goals (current goals can now be found in the care plan section) Acute Rehab PT Goals Patient Stated Goal: none stated today PT Goal Formulation: With family Time For Goal Achievement: 10/18/2016 Potential to Achieve Goals: Fair Progress towards PT goals: Progressing toward goals (goals re-assessed)     Frequency    Min 2X/week      PT Plan Current plan remains appropriate    Co-evaluation PT/OT/SLP Co-Evaluation/Treatment: Yes Reason for Co-Treatment: For patient/therapist safety PT goals addressed during session: Mobility/safety with mobility;Balance        AM-PAC PT "6 Clicks" Daily Activity  Outcome Measure  Difficulty turning over in bed (including adjusting bedclothes, sheets and blankets)?: Unable Difficulty moving from lying on back to sitting on the side of the bed? : Unable Difficulty sitting down on and standing up from a chair with arms (e.g., wheelchair, bedside commode, etc,.)?: Unable Help needed moving to and from a bed to chair (including a wheelchair)?: A Lot Help needed walking in hospital room?: Total Help needed climbing 3-5 steps with a railing? : Total 6 Click Score: 7    End of Session Equipment Utilized During Treatment: Gait belt Activity Tolerance: Patient limited by fatigue Patient left: in bed;with call bell/phone within reach;with bed alarm set;with family/visitor present Nurse Communication: Mobility status PT Visit Diagnosis: Muscle weakness (generalized) (M62.81);Other abnormalities of gait and mobility (R26.89);Unsteadiness on feet (R26.81);Adult, failure to thrive (R62.7)     Time: 1100-1115 PT Time Calculation (min) (ACUTE ONLY): 15 min  Charges:  $Therapeutic Activity: 8-22 mins          Kullen Tomasetti B. [place], [place], DPT [phone]            10/06/2016, 1:02 PM

## 2016-10-06 NOTE — Progress Notes (Signed)
Clinical Social Worker is still following patient for support and discharge needs. Patients disposition plan is to discharge to [facility] once medically stable. Admission coordinator Caryl Pina) from SNF stated they are able to take patient.   Rhea Pink, MSW,  [place]

## 2016-10-07 DIAGNOSIS — R109 Unspecified abdominal pain: Secondary | ICD-10-CM

## 2016-10-07 LAB — URINALYSIS, ROUTINE W REFLEX MICROSCOPIC
Bilirubin Urine: NEGATIVE
Glucose, UA: NEGATIVE mg/dL
KETONES UR: NEGATIVE mg/dL
Leukocytes, UA: NEGATIVE
Nitrite: NEGATIVE
Protein, ur: NEGATIVE mg/dL
SQUAMOUS EPITHELIAL / LPF: NONE SEEN
Specific Gravity, Urine: 1.013 (ref 1.005–1.030)
pH: 7 (ref 5.0–8.0)

## 2016-10-07 LAB — C DIFFICILE QUICK SCREEN W PCR REFLEX
C Diff antigen: NEGATIVE
C Diff interpretation: NOT DETECTED
C Diff toxin: NEGATIVE

## 2016-10-07 LAB — CBC
HCT: 33.3 % — ABNORMAL LOW (ref 36.0–46.0)
Hemoglobin: 10.6 g/dL — ABNORMAL LOW (ref 12.0–15.0)
MCH: 31.4 pg (ref 26.0–34.0)
MCHC: 31.8 g/dL (ref 30.0–36.0)
MCV: 98.5 fL (ref 78.0–100.0)
PLATELETS: 68 10*3/uL — AB (ref 150–400)
RBC: 3.38 MIL/uL — ABNORMAL LOW (ref 3.87–5.11)
RDW: 20.3 % — AB (ref 11.5–15.5)
WBC: 4.1 10*3/uL (ref 4.0–10.5)

## 2016-10-07 LAB — GLUCOSE, CAPILLARY
GLUCOSE-CAPILLARY: 104 mg/dL — AB (ref 65–99)
GLUCOSE-CAPILLARY: 128 mg/dL — AB (ref 65–99)
GLUCOSE-CAPILLARY: 90 mg/dL (ref 65–99)
GLUCOSE-CAPILLARY: 97 mg/dL (ref 65–99)
Glucose-Capillary: 89 mg/dL (ref 65–99)
Glucose-Capillary: 97 mg/dL (ref 65–99)

## 2016-10-07 LAB — BASIC METABOLIC PANEL
Anion gap: 7 (ref 5–15)
BUN: 15 mg/dL (ref 6–20)
CALCIUM: 7.1 mg/dL — AB (ref 8.9–10.3)
CO2: 27 mmol/L (ref 22–32)
CREATININE: 1.31 mg/dL — AB (ref 0.44–1.00)
Chloride: 103 mmol/L (ref 101–111)
GFR calc Af Amer: 50 mL/min — ABNORMAL LOW (ref >60)
GFR, EST NON AFRICAN AMERICAN: 43 mL/min — AB (ref >60)
GLUCOSE: 96 mg/dL (ref 65–99)
POTASSIUM: 4.6 mmol/L (ref 3.5–5.1)
SODIUM: 137 mmol/L (ref 135–145)

## 2016-10-07 LAB — MAGNESIUM: Magnesium: 1.8 mg/dL (ref 1.7–2.4)

## 2016-10-07 LAB — SODIUM, URINE, RANDOM: Sodium, Ur: 76 mmol/L

## 2016-10-07 LAB — CREATININE, URINE, RANDOM: CREATININE, URINE: 44.39 mg/dL

## 2016-10-07 NOTE — Progress Notes (Signed)
Patient ID: Haley Graham, female   DOB: 1955/03/18, 61 y.o.   MRN: [id number]  This NP visited patient at the bedside as a follow up for palliative needs and emotional support..    Patient is OOB in chair. She appears weak, very little participation in her ADLs Daughter/Haley Graham at bedside.   Continued conversation regarding current medical situation.  Daughter is verbalizing many concerns as it relates to nursing "motivating"her mother to participate in her care. Daughter would like that patient is required to get out of bed to use the bedside commode versus a bedpan.  She feels that her meal trays are not set up in a timely manner and that her mother is not positioned to be able to feed herself. ( Discussed with Therapist, sports)  Daughter is frustrated with her mother's level of motivation and her slow progress to recovery.  If has been very difficult for the family over the past many months; daughter would like for more aggressive physical therapy and wonders when she'll be able to discharge to a rehabilitation center.  We discussed in detail the need for patient participation and that if the patient herself refuses to participate, no one can be forced to do things.  Oral intake still only consists of the sips of soda and milkshakes. She is tolerating TF at 20 cc/hr. Will need to monitor for tolerance and titration.  Patient is encouraged to participate in physical therapy and self active range of motion exercises.     Questions and concerns addressed.  Palliative medicine team will continue to support holistically.  Time in  1230      Time out  1305  Time spent on the unit was 35 minutes  Greater than 50% of the time was spent in counseling and coordination of care  Wadie Lessen NP  Palliative Medicine Team Team Phone # [phone] Pager [phone]

## 2016-10-07 NOTE — Progress Notes (Signed)
Progress Note  Patient Name: Haley Graham Date of Encounter: 10/07/2016  Primary Cardiologist: new - Dr. Sallyanne Kuster  Subjective   No CP, no SOB.   Inpatient Medications    Scheduled Meds: . ALPRAZolam  0.25 mg Oral QHS  . aspirin EC  81 mg Oral Daily  . dronabinol  5 mg Oral BID AC  . DULoxetine  60 mg Oral Daily  . feeding supplement (ENSURE ENLIVE)  237 mL Oral TID BM  . feeding supplement (PRO-STAT SUGAR FREE 64)  30 mL Per Tube TID WC  . feeding supplement (VITAL 1.5 CAL)  1,000 mL Per Tube Q24H  . fluconazole  400 mg Oral Daily  . insulin aspart  0-5 Units Subcutaneous QHS  . insulin aspart  0-9 Units Subcutaneous TID WC  . lisdexamfetamine  20 mg Oral Daily  . metoprolol tartrate  25 mg Oral BID  . pantoprazole  40 mg Oral QHS  . potassium chloride  40 mEq Oral BID  . sodium chloride flush  3 mL Intravenous Q12H   Continuous Infusions:  PRN Meds: acetaminophen **OR** acetaminophen, alum & mag hydroxide-simeth, fluticasone, ipratropium-albuterol, lidocaine, loperamide, menthol-cetylpyridinium, ondansetron (ZOFRAN) IV, senna-docusate, sodium phosphate, traMADol   Vital Signs    Vitals:   10/06/16 2000 10/07/16 0000 10/07/16 0400 10/07/16 0800  BP: (!) 90/47 (!) 99/49 (!) 104/57 (!) 108/54  Pulse:      Resp: 16 18 14 15   Temp: 98 F (36.7 C) 98.2 F (36.8 C) 98.1 F (36.7 C)   TempSrc: Oral Oral Oral   SpO2: 100% 100% 98% 98%  Weight:      Height:        Intake/Output Summary (Last 24 hours) at 10/07/16 1126 Last data filed at 10/06/16 1800  Gross per 24 hour  Intake                0 ml  Output              450 ml  Net             -450 ml   Filed Weights   09/09/2016 0413 10/04/16 0511 10/06/16 0600  Weight: 150 lb (68 kg) 160 lb (72.6 kg) 144 lb 12.8 oz (65.7 kg)     Physical Exam   GEN: Well nourished, well developed, in no acute distress  HEENT: normal  Neck: no JVD, carotid bruits, or masses Cardiac: RRR; no murmurs, rubs, or gallops,no  edema  Respiratory:  clear to auscultation bilaterally, normal work of breathing GI: soft, nontender, nondistended, + BS MS: no deformity or atrophy  Skin: warm and dry, no rash Neuro:  Alert and Oriented x 3, Strength and sensation are intact Psych: euthymic mood, full affect   Labs    Chemistry  Recent Labs Lab 10/05/16 0251 10/06/16 0939 10/07/16 0508  NA 137 137 137  K 3.6 3.3* 4.6  CL 103 101 103  CO2 27 29 27   GLUCOSE 93 115* 96  BUN 8 13 15   CREATININE 0.76 0.87 1.31*  CALCIUM 7.0* 6.9* 7.1*  GFRNONAA >60 >60 43*  GFRAA >60 >60 50*  ANIONGAP 7 7 7      Hematology  Recent Labs Lab 10/05/16 0251 10/06/16 0939 10/07/16 0508  WBC 5.2 5.7 4.1  RBC 3.47* 3.28* 3.38*  HGB 10.9* 10.1* 10.6*  HCT 34.2* 32.0* 33.3*  MCV 98.6 97.6 98.5  MCH 31.4 30.8 31.4  MCHC 31.9 31.6 31.8  RDW 20.5* 20.1* 20.3*  PLT  81* 75* 68*    Cardiac EnzymesNo results for input(s): TROPONINI in the last 168 hours. No results for input(s): TROPIPOC in the last 168 hours.   BNPNo results for input(s): BNP, PROBNP in the last 168 hours.   DDimer No results for input(s): DDIMER in the last 168 hours.   Radiology    No results found.   Telemetry    Sinus rhythm - Personally Reviewed  ECG    Sinus rhythm, TWI improving - Personally Reviewed   Cardiac Studies   Echocardiogram 09/29/16: Study Conclusions - Left ventricle: The cavity size was normal. Wall thickness was   normal. Systolic function was moderately to severely reduced. The   estimated ejection fraction was in the range of 30% to 35%. There   is akinesis of the mid-apicalanteroseptal and anterior   myocardium. Doppler parameters are consistent with abnormal left   ventricular relaxation (grade 1 diastolic dysfunction). - Aortic valve: Trileaflet; mildly thickened, mildly calcified   leaflets. - Mitral valve: Calcified annulus. There was mild regurgitation.  Impressions: - EF is reduced when compared to prior  study.  Patient Profile     61 y.o. 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 Heart failure symptoms resolved promptly with diuretic therapy.  Assessment & Plan    Type B aortic dissection  - thoracic stent graft  Cardiomyopathy  - EF 35-40%, new, ?etiol, perhaps stress induced. No CP. Would have expected large troponin increase if associated with coronary abnormality.   - Continue to treat aggressively medically  - In future, could pursue NUC stress to evaluate ischemic burden but at this time she is not able to have this testing done. Would like to see her improved from a global perspective (lethargy etc).   - She does not appear in overt heart failure. No CP. No acute needs for ischemic testing.   - OK to hold lasix today (creat is elevated)  - ECHO repeat shows mild improvement in EF from 30-35% to 35-40% range with anteroseptal WMA.  Hopefully as she continues to heal, her heart will improve as well.  - I am comfortable from cardiac perspective with her moving with PT if able.   - I think her heart is playing a role but clearly she has more overwhelming medical issues.   QT prolongation  - trying to avoid prolonging meds  Hypomag  - agree with IV repl  Candidiasis sepsis  - fluconazole  Delirium  - fatigue, confusion   - per hospitalist team  - spoke with husband, MIKE, and spoke with daughter on phone.   Complex, multisystem illness.   Candee Furbish, MD

## 2016-10-07 NOTE — Progress Notes (Signed)
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 The blood culture grwing is to therefore started on fluconazole for candidemia. Evaluated by ID. ID recommended continuing fluconazole 400 mg daily through 9/26. Patient now with congestive heart failure, hypoxia and echo showed reduced EF of 30-35% with akinesis of mid-apicalanteroseptal and anterior myocardium.   Assessment & Plan:   Principal Problem:   Candidemia ([place]) Active Problems:   GERD (gastroesophageal reflux disease)   Dissection of thoracoabdominal aorta (HCC)   AAA (abdominal aortic aneurysm) (HCC)   Benign essential HTN   Diabetes mellitus type 2 in obese (HCC)   Anxiety state   Hyponatremia   Leukocytosis   Sepsis (HCC)   Hypotension   AKI (acute kidney injury) ([place])   Dehydration   Generalized weakness   Anemia   Thrombocytopenia (HCC)   Abnormal computed tomography angiography (CTA) of abdomen and pelvis   Colitis: Probable   Protein-calorie malnutrition, severe   Palliative care by specialist   Adult failure to thrive   Hypophosphatemia   Hypomagnesemia   Depression   Acute systolic CHF (congestive heart failure) ([place])   DNR (do not resuscitate) discussion   Congestive heart failure ([place])   Heme positive stool   Cardiac LV ejection fraction 30-35%   QT prolongation   # Sepsis due to candidemia: -Continue fluconazole until September 26 and needs follow-up with ophthalmologist in a month as per  infectious disease.  #Acute systolic congestive heart failure:  - receiving lasix IV 40 mg daily, will hold and discuss with cards given worsening kidney function today [ ]  repeat echo with EF 35-40% and grade 2 diastolic dysfunction as well as hypokinesis of mid-apicalanteroseptal, anterior, and inferolateral myocardium (mildly improved from 8/28 echo at 30-35%) - may need Lexiscan Myoview versus cath. Denied shortness of breath or chest pain today. Continue to monitor urine output and electrolytes. Cardiology consult appreciated. - on beta blocker, consider ace/arb in future  #Prolonged QTC: Normal QTC today, EKG with NSR, normal intervals.  Discontinue Azithromycin and abilify. Monitor in telemetry. Change zofran to q4hr as needed from standing order. Repeat EKG.  #Severe protein calorie malnutrition: Patient is on NG tube because of decreased oral intake and severe malnutrition. Evaluated by  swallow team. Currently on tube feeding, encourage oral intake. Palliative care is following.  #Acute kidney injury - worsened to 1.31 today from baseline 0.7-0.8.  Has been receiving lasix for the past several days and this may be cause.  Will hold lasix for now, consider decreasing dose to PO (received dose today).  Will check UA and lytes and ctm.  Her volume status seems improved from the last time I saw her, but exam difficult.   #Acute on Chronic anemia likely due to chronic disease vs acute GI bleed: Iron stores acceptable. Fecal occult blood test positive. On Protonix. Status post 2 unit of red blood cell transfusion on 8/30 with improvement in hemoglobin. Evaluated by GI. No further endoscopy at this time. Hemoglobin level stable. -Monitor CBC.  #Nausea and abdominal discomfort: Abdominal x-ray consistent with adynamic ileus. Evaluated by GI. Patient is tolerating tube  feeds. Continue Zofran and symptomatic treatment. Unable to take reglan because of risk of QT prolongation. Clinically stable  and denies abdominal pain today.  #Thrombocytopenia likely due to acute illness: No sign of bleeding. Monitor CBC. Platelet count 68 (up and down over past few days). Monitor labs  #Anxiety depression: Evaluated by psychiatrist. -Continue Xanax, Cymbalta andVyvanse per psychiatrist  # hypokalemia/hypomagnesemia: Repleted magnesium and potassium chloride. Monitor labs.  # Loose stools: follow up c diff.    DVT prophylaxis: SCDs in place Code Status: full code Family Communication: will call husband [name]  Disposition Plan: SNF    Consultants:   Cardiology, palliative, nutrition, GI, ID, psychiatry   Procedures: (Don't include imaging studies which can be auto populated. Include things that cannot be auto populated i.e. Echo, Carotid and venous dopplers, Foley, Bipap, HD, tubes/drains, wound vac, central lines etc)  Thoracentesis 8/17  Echo 8/28 EF 30-35%, akinesis of mid-apicalanteroseptal and anterior myocardium. Diastolic dysfunction.  Echo 8/13 EF 37-85, diastolic dysfunction, lipomatous hypertrophy  Echo 9/4 EF 35-40%, hypokinesis of mid apicalanteroseptal, anterior, and inferolateral myocardium, grade 2 diastolic dysfunction  Antimicrobials: (specify start and planned stop date. Auto populated tables are space occupying and do not give end dates)  fluconazole    Subjective: No CP, SOB.  Mild nausea, notes it typically gets worse in afternoon.  No abdominal pain.  Last BM yesterday.  Objective: Vitals:   10/06/16 2000 10/07/16 0000 10/07/16 0400 10/07/16 0800  BP: (!) 90/47 (!) 99/49 (!) 104/57 (!) 108/54  Pulse:      Resp: 16 18 14 15   Temp: 98 F (36.7 C) 98.2 F (36.8 C) 98.1 F (36.7 C)   TempSrc: Oral Oral Oral   SpO2: 100% 100% 98% 98%  Weight:      Height:        Intake/Output Summary (Last 24 hours) at 10/07/16 0845 Last data filed at 10/06/16 1800  Gross per 24 hour  Intake                0 ml  Output              450 ml  Net              -450 ml   Filed Weights   10/01/2016 0413 10/04/16 0511 10/06/16 0600  Weight: 68 kg (150 lb) 72.6 kg (160 lb) 65.7 kg (144 lb 12.8 oz)    Examination:  General exam: Appears calm and comfortable; chronically ill  Respiratory system: Clear to auscultation. Respiratory effort normal. Cardiovascular system: S1 & S2 heard, RRR. No JVD, murmurs, rubs, gallops or clicks.  Gastrointestinal system: Abdomen is nondistended, soft and nontender. No organomegaly or masses felt. Normal bowel sounds heard. Central nervous system: Alert and oriented. No focal neurological deficits. Extremities: moving all extremities, trace LEE Skin: No rashes, lesions or ulcers Psychiatry: Judgement and insight appear normal. Mood & affect appropriate.     Data Reviewed: I have personally reviewed following labs and imaging studies  CBC:  Recent Labs Lab 10/01/2016 0502 10/03/16 0547 10/05/16 0251 10/06/16 0939 10/07/16 0508  WBC 5.8 6.0 5.2 5.7 4.1  HGB 10.8* 9.7* 10.9* 10.1* 10.6*  HCT 34.4* 29.8* 34.2* 32.0* 33.3*  MCV 96.6 97.7 98.6 97.6 98.5  PLT 92* 81* 81* 75* 68*   Basic Metabolic Panel:  Recent Labs Lab 09/30/16 0911 10/01/16 0424 09/21/2016 0352 10/03/16 0547 10/05/16 0251 10/06/16 0939 10/07/16 0508  NA 137 137 137 135 137 137 137  K 3.4*  2.9* 3.8 4.5 3.6 3.3* 4.6  CL 105 105 106 103 103 101 103  CO2 25 26 24 22 27 29 27   GLUCOSE 95 90 89 76 93 115* 96  BUN 15 12 11 10 8 13 15   CREATININE 0.89 0.88 0.86 0.79 0.76 0.87 1.31*  CALCIUM 7.1* 7.0* 7.2* 7.0* 7.0* 6.9* 7.1*  MG 1.8 1.3* 1.6* 1.8 1.2* 1.5* 1.8  PHOS 3.2 3.2  --   --   --   --   --    GFR: Estimated Creatinine Clearance: 41.6 mL/min (A) (by C-G formula based on SCr of 1.31 mg/dL (H)). Liver Function Tests:  Recent Labs Lab 09/30/16 0911  AST 20  ALT 14  ALKPHOS 489*  BILITOT 0.9  PROT 4.5*  ALBUMIN 1.5*   No results for input(s): LIPASE, AMYLASE in the last 168 hours. No results for input(s): AMMONIA in the  last 168 hours. Coagulation Profile: No results for input(s): INR, PROTIME in the last 168 hours. Cardiac Enzymes: No results for input(s): CKTOTAL, CKMB, CKMBINDEX, TROPONINI in the last 168 hours. BNP (last 3 results) No results for input(s): PROBNP in the last 8760 hours. HbA1C: No results for input(s): HGBA1C in the last 72 hours. CBG:  Recent Labs Lab 10/06/16 1607 10/06/16 2029 10/07/16 0017 10/07/16 0430 10/07/16 0820  GLUCAP 125* 100* 90 97 89   Lipid Profile: No results for input(s): CHOL, HDL, LDLCALC, TRIG, CHOLHDL, LDLDIRECT in the last 72 hours. Thyroid Function Tests: No results for input(s): TSH, T4TOTAL, FREET4, T3FREE, THYROIDAB in the last 72 hours. Anemia Panel: No results for input(s): VITAMINB12, FOLATE, FERRITIN, TIBC, IRON, RETICCTPCT in the last 72 hours. Sepsis Labs: No results for input(s): PROCALCITON, LATICACIDVEN in the last 168 hours.  No results found for this or any previous visit (from the past 240 hour(s)).       Radiology Studies: No results found.      Scheduled Meds: . ALPRAZolam  0.25 mg Oral QHS  . aspirin EC  81 mg Oral Daily  . dronabinol  5 mg Oral BID AC  . DULoxetine  60 mg Oral Daily  . feeding supplement (ENSURE ENLIVE)  237 mL Oral TID BM  . feeding supplement (PRO-STAT SUGAR FREE 64)  30 mL Per Tube TID WC  . feeding supplement (VITAL 1.5 CAL)  1,000 mL Per Tube Q24H  . fluconazole  400 mg Oral Daily  . furosemide  40 mg Intravenous Daily  . insulin aspart  0-5 Units Subcutaneous QHS  . insulin aspart  0-9 Units Subcutaneous TID WC  . lisdexamfetamine  20 mg Oral Daily  . metoprolol tartrate  25 mg Oral BID  . pantoprazole  40 mg Oral QHS  . potassium chloride  40 mEq Oral BID  . sodium chloride flush  3 mL Intravenous Q12H   Continuous Infusions:   LOS: 28 days    Time spent: 35 min    A Melven Sartorius, MD [facility] [phone]   If 7PM-7AM, please contact  night-coverage [email or web address] Password [email or web address] 10/07/2016, 8:45 AM

## 2016-10-08 DIAGNOSIS — I509 Heart failure, unspecified: Secondary | ICD-10-CM

## 2016-10-08 LAB — CBC
HCT: 31.7 % — ABNORMAL LOW (ref 36.0–46.0)
HEMOGLOBIN: 9.9 g/dL — AB (ref 12.0–15.0)
MCH: 30.9 pg (ref 26.0–34.0)
MCHC: 31.2 g/dL (ref 30.0–36.0)
MCV: 99.1 fL (ref 78.0–100.0)
Platelets: 70 10*3/uL — ABNORMAL LOW (ref 150–400)
RBC: 3.2 MIL/uL — AB (ref 3.87–5.11)
RDW: 20 % — ABNORMAL HIGH (ref 11.5–15.5)
WBC: 4.7 10*3/uL (ref 4.0–10.5)

## 2016-10-08 LAB — COMPREHENSIVE METABOLIC PANEL
ALBUMIN: 1.2 g/dL — AB (ref 3.5–5.0)
ALK PHOS: 284 U/L — AB (ref 38–126)
ALT: 16 U/L (ref 14–54)
AST: 24 U/L (ref 15–41)
Anion gap: 6 (ref 5–15)
BILIRUBIN TOTAL: 0.8 mg/dL (ref 0.3–1.2)
BUN: 17 mg/dL (ref 6–20)
CHLORIDE: 104 mmol/L (ref 101–111)
CO2: 27 mmol/L (ref 22–32)
CREATININE: 0.81 mg/dL (ref 0.44–1.00)
Calcium: 7.1 mg/dL — ABNORMAL LOW (ref 8.9–10.3)
Glucose, Bld: 97 mg/dL (ref 65–99)
Potassium: 4.5 mmol/L (ref 3.5–5.1)
SODIUM: 137 mmol/L (ref 135–145)
TOTAL PROTEIN: 4.4 g/dL — AB (ref 6.5–8.1)

## 2016-10-08 LAB — GLUCOSE, CAPILLARY
GLUCOSE-CAPILLARY: 113 mg/dL — AB (ref 65–99)
GLUCOSE-CAPILLARY: 88 mg/dL (ref 65–99)
GLUCOSE-CAPILLARY: 91 mg/dL (ref 65–99)
Glucose-Capillary: 122 mg/dL — ABNORMAL HIGH (ref 65–99)
Glucose-Capillary: 121 mg/dL — ABNORMAL HIGH (ref 65–99)

## 2016-10-08 LAB — UREA NITROGEN, URINE: UREA NITROGEN UR: 381 mg/dL

## 2016-10-08 MED ORDER — FUROSEMIDE 20 MG PO TABS
20.0000 mg | ORAL_TABLET | Freq: Every day | ORAL | Status: DC
  Administered 2016-10-09 – 2016-10-12 (×4): 20 mg via ORAL
  Filled 2016-10-08 (×4): qty 1

## 2016-10-08 MED ORDER — FUROSEMIDE 40 MG PO TABS: 40.0000 mg | ORAL_TABLET | Freq: Every day | ORAL | Status: DC

## 2016-10-08 MED ORDER — VITAL 1.5 CAL PO LIQD
1000.0000 mL | ORAL | Status: DC
  Administered 2016-10-08 – 2016-10-10 (×4): 1000 mL
  Filled 2016-10-08 (×5): qty 1000

## 2016-10-08 MED ORDER — PRO-STAT SUGAR FREE PO LIQD
30.0000 mL | Freq: Three times a day (TID) | ORAL | Status: DC
  Administered 2016-10-08 – 2016-10-10 (×4): 30 mL
  Filled 2016-10-08 (×7): qty 30

## 2016-10-08 NOTE — Progress Notes (Addendum)
   Creatinine back to 0.8 Would be reasonable to place on low dose lasix 20 PO QD for maintenance.   No new cardiac recommendations. Hopeful improvement in cardiomyopathy over time. Follow up in clinic would be reasonable in 4-6 weeks with Dr. Sallyanne Kuster or APP. Repeating ECHO at that time seems prudent.   Vitals reviewed. Heart RRR, Panda tube in place Spoke with Dr. Florene Glen yesterday.  Spoke with daughter in room.   Signing off. Please call if ?  Candee Furbish, MD

## 2016-10-08 NOTE — Progress Notes (Signed)
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 The blood culture grwing is to therefore started on fluconazole for candidemia. Evaluated by ID. ID recommended continuing fluconazole 400 mg daily through 9/26. Patient now with congestive heart failure, hypoxia and echo showed reduced EF of 30-35% with akinesis of mid-apicalanteroseptal and anterior myocardium.   Assessment & Plan:   Principal Problem:   Candidemia ([place]) Active Problems:   GERD (gastroesophageal reflux disease)   Dissection of thoracoabdominal aorta (HCC)   AAA (abdominal aortic aneurysm) (HCC)   Benign essential HTN   Diabetes mellitus type 2 in obese (HCC)   Anxiety state   Hyponatremia   Leukocytosis   Sepsis (HCC)   Hypotension   AKI (acute kidney injury) ([place])   Dehydration   Generalized weakness   Anemia   Thrombocytopenia (HCC)   Abnormal computed tomography angiography (CTA) of abdomen and pelvis   Colitis: Probable   Protein-calorie malnutrition, severe   Palliative care by specialist   Adult failure to thrive   Hypophosphatemia   Hypomagnesemia   Depression   Acute systolic CHF (congestive heart failure) ([place])   DNR (do not resuscitate) discussion   Congestive heart failure ([place])   Heme positive stool   Cardiac LV ejection fraction 30-35%   QT prolongation   # Sepsis due to candidemia: -Continue fluconazole until September 26 and needs follow-up with ophthalmologist in a month as per  infectious disease. [ ]  some concern for thrush, she does have white lesions over her tongue, is on fluconazole, would be unusual for thrush, but will repeat exam with tongue depressor   #Acute systolic congestive heart failure:  - resume lasix 20 mg PO daily, I/O's daily weights [ ]  repeat echo with EF 35-40% and grade 2 diastolic dysfunction as well as hypokinesis of mid-apicalanteroseptal, anterior, and inferolateral myocardium (mildly improved from 8/28 echo at 30-35%) [ ]  cardiology rec f/u in clinic in 4-6 weeks with repeat echo - may need Lexiscan Myoview versus cath. Denied shortness of breath or chest pain today. Continue to monitor urine output and electrolytes. Cardiology consult appreciated. - on beta blocker, consider ace/arb in future  #Prolonged QTC: Normal QTC today.  Discontinue Azithromycin and abilify. Monitor in telemetry. Change zofran to q4hr as needed from standing order. Repeat EKG.  #Severe protein calorie malnutrition: Patient is on NG tube because of decreased oral intake and severe malnutrition. Evaluated by  swallow team. Currently on tube feeding, encourage oral intake. Palliative care is following.  Will need to discuss plans for long term nutrition.  #Acute kidney injury - Improved, restart lasix tomorrow 20 mg PO.    #Acute on Chronic anemia likely due to chronic disease vs acute GI bleed: Iron stores acceptable. Fecal occult blood test positive. On Protonix. Status post 2 unit of red blood cell transfusion on 8/30 with improvement in hemoglobin. Evaluated by GI. No further endoscopy at this time. Hemoglobin level stable. -Monitor CBC.  #Nausea and abdominal discomfort: Abdominal x-ray consistent with adynamic ileus. Evaluated by GI. Patient is tolerating tube feeds. Continue Zofran and symptomatic treatment. Unable to take reglan because of  risk of QT prolongation. Clinically stable and denies abdominal pain today.  #Thrombocytopenia likely due to acute  illness: No sign of bleeding. Monitor CBC. Platelet count up and down over past few days. Monitor labs  #Anxiety depression: Evaluated by psychiatrist. -Continue Xanax, Cymbalta andVyvanse per psychiatrist  # hypokalemia/hypomagnesemia: Repleted magnesium and potassium chloride. Monitor labs.  # Loose stools: follow up c diff.   # Delirium: some confusion noted this morning by daughter, likely related to her prolonged hospitalization.  She was appropriate for me, A&Ox2.   Delirium prec, ctm  DVT prophylaxis: SCDs in place Code Status: full code Family Communication: will call husband [name]  Disposition Plan: SNF    Consultants:   Cardiology, palliative, nutrition, GI, ID, psychiatry   Procedures: (Don't include imaging studies which can be auto populated. Include things that cannot be auto populated i.e. Echo, Carotid and venous dopplers, Foley, Bipap, HD, tubes/drains, wound vac, central lines etc)  Thoracentesis 8/17  Echo 8/28 EF 30-35%, akinesis of mid-apicalanteroseptal and anterior myocardium. Diastolic dysfunction.  Echo 8/13 EF 10-62, diastolic dysfunction, lipomatous hypertrophy  Echo 9/4 EF 35-40%, hypokinesis of mid apicalanteroseptal, anterior, and inferolateral myocardium, grade 2 diastolic dysfunction  Antimicrobials: (specify start and planned stop date. Auto populated tables are space occupying and do not give end dates)  fluconazole    Subjective: No CP, SOB.  No nausea.  Feeling sleepy.  A&Ox2 (thought it was July).  Asked her thoughts about more permanent feeding tube and she stated she'd need to think about it.   Objective: Vitals:   10/08/16 0400 10/08/16 0413 10/08/16 0816 10/08/16 1145  BP: 121/70  134/61 (!) 104/52  Pulse:   100   Resp:  (!) 23 19 18   Temp:  98 F (36.7 C) 98.6 F (37 C) 98.7 F (37.1 C)  TempSrc:  Oral Oral Oral  SpO2:  99% 98% 100%  Weight:      Height:        Intake/Output Summary (Last 24 hours) at 10/08/16  1410 Last data filed at 10/08/16 0500  Gross per 24 hour  Intake              940 ml  Output              900 ml  Net               40 ml   Filed Weights   09/21/2016 0413 10/04/16 0511 10/06/16 0600  Weight: 68 kg (150 lb) 72.6 kg (160 lb) 65.7 kg (144 lb 12.8 oz)    Examination:  General: No acute distress. Chronically ill.  Cardiovascular: Heart sounds show a regular rate, and rhythm. No gallops or rubs. No murmurs. No JVD. Lungs: Clear to auscultation bilaterally with good air movement. No rales, rhonchi or wheezes. Abdomen: Soft, nontender, nondistended with normal active bowel sounds. No masses. No hepatosplenomegaly. Neurological: Alert and oriented 2. Moves all extremities 4 with equal strength. Cranial nerves II through XII grossly intact.  PERRL and EOMI. Skin: Warm and dry. No rashes or lesions. Extremities: No clubbing or cyanosis. TED hose in place. Pedal pulses 2+. Psychiatric: Mood and affect are normal. Insight and judgment are appropriate.   Data Reviewed: I have personally reviewed following labs and imaging studies  CBC:  Recent Labs Lab 10/03/16 0547 10/05/16 0251 10/06/16 0939 10/07/16 0508 10/08/16 0230  WBC 6.0 5.2 5.7 4.1 4.7  HGB 9.7* 10.9* 10.1* 10.6* 9.9*  HCT 29.8* 34.2* 32.0* 33.3* 31.7*  MCV  97.7 98.6 97.6 98.5 99.1  PLT 81* 81* 75* 68* 70*   Basic Metabolic Panel:  Recent Labs Lab 09/25/2016 0352 10/03/16 0547 10/05/16 0251 10/06/16 0939 10/07/16 0508 10/08/16 0230  NA 137 135 137 137 137 137  K 3.8 4.5 3.6 3.3* 4.6 4.5  CL 106 103 103 101 103 104  CO2 24 22 27 29 27 27   GLUCOSE 89 76 93 115* 96 97  BUN 11 10 8 13 15 17   CREATININE 0.86 0.79 0.76 0.87 1.31* 0.81  CALCIUM 7.2* 7.0* 7.0* 6.9* 7.1* 7.1*  MG 1.6* 1.8 1.2* 1.5* 1.8  --    GFR: Estimated Creatinine Clearance: 67.3 mL/min (by C-G formula based on SCr of 0.81 mg/dL). Liver Function Tests:  Recent Labs Lab 10/08/16 0230  AST 24  ALT 16  ALKPHOS 284*  BILITOT  0.8  PROT 4.4*  ALBUMIN 1.2*   No results for input(s): LIPASE, AMYLASE in the last 168 hours. No results for input(s): AMMONIA in the last 168 hours. Coagulation Profile: No results for input(s): INR, PROTIME in the last 168 hours. Cardiac Enzymes: No results for input(s): CKTOTAL, CKMB, CKMBINDEX, TROPONINI in the last 168 hours. BNP (last 3 results) No results for input(s): PROBNP in the last 8760 hours. HbA1C: No results for input(s): HGBA1C in the last 72 hours. CBG:  Recent Labs Lab 10/07/16 1620 10/07/16 2038 10/08/16 0020 10/08/16 0420 10/08/16 1333  GLUCAP 104* 97 88 91 122*   Lipid Profile: No results for input(s): CHOL, HDL, LDLCALC, TRIG, CHOLHDL, LDLDIRECT in the last 72 hours. Thyroid Function Tests: No results for input(s): TSH, T4TOTAL, FREET4, T3FREE, THYROIDAB in the last 72 hours. Anemia Panel: No results for input(s): VITAMINB12, FOLATE, FERRITIN, TIBC, IRON, RETICCTPCT in the last 72 hours. Sepsis Labs: No results for input(s): PROCALCITON, LATICACIDVEN in the last 168 hours.  Recent Results (from the past 240 hour(s))  C difficile quick screen w PCR reflex     Status: None   Collection Time: 10/07/16 11:58 AM  Result Value Ref Range Status   C Diff antigen NEGATIVE NEGATIVE Final   C Diff toxin NEGATIVE NEGATIVE Final   C Diff interpretation No C. difficile detected.  Final         Radiology Studies: No results found.      Scheduled Meds: . ALPRAZolam  0.25 mg Oral QHS  . aspirin EC  81 mg Oral Daily  . dronabinol  5 mg Oral BID AC  . DULoxetine  60 mg Oral Daily  . feeding supplement (ENSURE ENLIVE)  237 mL Oral TID BM  . feeding supplement (PRO-STAT SUGAR FREE 64)  30 mL Per Tube TID  . feeding supplement (VITAL 1.5 CAL)  1,000 mL Per Tube Q24H  . fluconazole  400 mg Oral Daily  . insulin aspart  0-5 Units Subcutaneous QHS  . insulin aspart  0-9 Units Subcutaneous TID WC  . lisdexamfetamine  20 mg Oral Daily  . metoprolol  tartrate  25 mg Oral BID  . pantoprazole  40 mg Oral QHS  . potassium chloride  40 mEq Oral BID  . sodium chloride flush  3 mL Intravenous Q12H   Continuous Infusions:   LOS: 29 days    Time spent: 35 min    A Melven Sartorius, MD [facility] [phone]   If 7PM-7AM, please contact night-coverage [email or web address] Password [email or web address] 10/08/2016, 2:10 PM

## 2016-10-08 NOTE — Progress Notes (Addendum)
Nutrition Follow-up  DOCUMENTATION CODES:   Severe malnutrition in context of chronic illness  INTERVENTION:    Increase Vital 1.5 formula to 30 ml/hr   Continue Prostat 30 ml TID via tube  Total TF regimen to provide 1380 kcals, 93 gm protein, 550 ml of free water daily This is meeting 69% of estimated kcal needs and 85% of estimated protein needs  NUTRITION DIAGNOSIS:   Malnutrition (severe) related to chronic illness (failure to thrive post repair aortic dissection) as evidenced by energy intake < or equal to 50% for > or equal to 1 month, percent weight loss, ongoing  GOAL:   Patient will meet greater than or equal to 90% of their needs, progressing  MONITOR:   TF tolerance, PO intake, Labs, Skin, I & O's  ASSESSMENT:    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 She presented with weakness and met sepsis criteria with concern for enteritis. Antibiotics started. Cultures pending.  RD spoke with daughter at bedside. Daughter reports her Mom is not eating. Vital 1.5 formula infusing at 20 ml/hr via Cortrak feeding tube.  No nausea or abdominal pain at this time. Labs and medications reviewed. CBG's [id number].  Spoke with Dr. Cephus Slater regarding this case. Verbal with Read Back order received to increase TF to 30 ml/hr. Rec long term feeding access; ? G-J tube placement for simultaneous gastric decompression and feeding.  Diet Order:  Diet regular Room service appropriate? Yes; Fluid consistency: Thin  Skin:  Reviewed, no issues (no pressure ulcers)  Last BM:  9/6  Height:   Ht Readings from Last 1 Encounters:  10/06/16 _0  (1.6 m)   Weight: >>  trending down  Wt Readings from Last 1 Encounters:  10/06/16 144 lb 12.8 oz (65.7 kg)   Ideal Body Weight:  52.2 kg  BMI:  Body mass index is 25.65 kg/m.  Estimated Nutritional Needs:   Kcal:  2000-2200  Protein:  110-120 gm  Fluid:  2.0-2.2 L  EDUCATION NEEDS:   No education needs identified at this time  Arthur Holms, RD, LDN Pager #: [phone] After-Hours Pager #: [phone]

## 2016-10-08 NOTE — Care Management Note (Signed)
Case Management Note Previous CM note initiated by Carles Collet, RN 09/11/2016, 1:50 PM  Patient Details  Name: IDELL HISSONG MRN: [id number] Date of Birth: 1955/03/10  Subjective/Objective:                 Patient discharged 7/21 to home w [facility] services. Requested from [facility] if patient was still active, will update note as info becomes available.   Action/Plan:   Expected Discharge Date:   (unknown)               Expected Discharge Plan:  Skilled Nursing Facility  In-House Referral:  Clinical Social Work  Discharge planning Services  CM Consult  Post Acute Care Choice:  Home Health, Resumption of Svcs/PTA Provider Choice offered to:  Patient, Adult Children  DME Arranged:    DME Agency:     HH Arranged:    [place] Agency:  [place]  Status of Service:  In process, will continue to follow  If discussed at Long Length of Stay Meetings, dates discussed: 8/21, 8/23, 9/6   Discharge Disposition:   Additional Comments:  10/08/16- 1040- Colsen Modi RN, CM- pt now back on 4E- continues with TF-  CSW following for SNF- [facility]- when pt is medically stable for discharge.   09/24/16- 0900- Marvetta Gibbons RN, CM- per QC mtg- pt will be HRI with [facility] if pt returns home and refuses SNF- also if pt gets FT can go home with [facility] services and TF management at home- CM to follow. - CM on 3E updated.   09/23/16- 1700- Marvetta Gibbons RN, CM- pt continues to be difficult to motivate- PC following, may need TF for nutritional support. Recommendations for SNF - CSW following - however in the end not sure pt and husband will agree to SNF vs return home with [facility] services. CM will continue to follow   09/14/16- 1025- Leveon Pelzer RN, CM- spoke with Santiago Glad at [facility]- pt is active with them for HHRN/PT- however received call from pt's daughter who wants to look at [facility]- will let CSW know - per PT eval recommendations for SNF- CSW will follow for placement needs.   Dawayne Patricia, RN 10/08/2016, 10:42 AM

## 2016-10-08 NOTE — Progress Notes (Signed)
  Speech Language Pathology Patient Details Name: Haley Graham MRN: [id number] DOB: Nov 05, 1955 Today's Date: 10/08/2016 Time:  -     Pt's swallow assessed on 9/1 and found to be grossly intact. SLP was concerned about possible thrush due to grimace during swallow. Pt stated the food is dry and her taste is altered. Chart review shows continued decreased po intake. At this point, not much to add from ST standpoint and will sign off. If oropharyngeal deficits arise, please reconsult.            [name] 10/08/2016, 2:00 PM [name] [name] [phone]

## 2016-10-08 NOTE — Progress Notes (Signed)
Physical Therapy Treatment Patient Details Name: Haley Graham MRN: [id number] DOB: Feb 21, 1955 Today's Date: 10/08/2016    History of Present Illness This 61 y.o. female admitted with sepsis, hypotension, dehydration, amemia, COPD, AKI due to poor oral intake and hypotension/dehydration.  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 Once pt sat EoB she refused to try to stand to the Stedy to work on balance and endurance in standing. Pt only agreed to attempt to stand in order to get her hips further into the bed in order to lay down. Pt currently, modAx2 for supine to sit, maxAx2 for 3x attempts for sit>stand and total assist for sit to supine. Pt requires skilled PT to progress her mobility and improve her strength and endurance in order to mobilize in her discharge environment.      Follow Up Recommendations  SNF     Equipment Recommendations  None recommended by PT       Precautions / Restrictions Precautions Precautions: Fall Precaution Comments: cortrak Restrictions Weight Bearing Restrictions: No    Mobility  Bed Mobility Overal bed mobility: Needs Assistance Bed Mobility: Sit to Supine     Supine to sit: Mod assist;+2 for physical assistance Sit to supine: +2 for physical assistance;Total assist   General bed mobility comments: modAx2 for trunk to upright and LE managment to floor, after sitting EoB pt required total assist to manage LE into bed and to center hips and trunk   Transfers Overall transfer level: Needs assistance   Transfers: Sit to/from Stand Sit to Stand: Max assist;+2 physical assistance         General transfer comment: pt required maxAx2 for sit to stand in [place] and was not able to  achieve fully upright, 3 attempts were made with decreasing assist from patient.          Balance Overall balance assessment: Needs assistance Sitting-balance support: Feet supported;Bilateral upper extremity supported Sitting balance-Leahy Scale: Poor Sitting balance - Comments: Min guard assist EOB to maintain sitting balance with bil UE supported.    Standing balance support: Bilateral upper extremity supported Standing balance-Leahy Scale: Zero Standing balance comment: unable to attain balance in standing                            Cognition Arousal/Alertness: Awake/alert Behavior During Therapy: Flat affect;Anxious Overall Cognitive Status: Impaired/Different from baseline                                 General Comments: Not specifically assessed      Exercises      General Comments        Pertinent Vitals/Pain Pain Assessment: Faces Faces Pain Scale: Hurts little more Pain Location: generalized Pain Descriptors / Indicators: Grimacing;Guarding Pain Intervention(s): Monitored during session           PT Goals (current goals can now be found in the care plan section) Acute Rehab PT Goals Patient Stated Goal: none stated today PT Goal Formulation: With family Time For Goal Achievement: 10/04/2016 Potential to Achieve Goals: Fair Progress towards PT goals: Not progressing toward goals - comment    Frequency  Min 2X/week      PT Plan Current plan remains appropriate    Co-evaluation              AM-PAC PT "6 Clicks" Daily Activity  Outcome Measure  Difficulty turning over in bed (including adjusting bedclothes, sheets and blankets)?: Unable Difficulty moving from lying on back to sitting on the side of the bed? : Unable Difficulty sitting down on and standing up from a chair with arms (e.g., wheelchair, bedside commode, etc,.)?: Unable Help needed moving to and from a bed to chair (including a wheelchair)?: A  Lot Help needed walking in hospital room?: Total Help needed climbing 3-5 steps with a railing? : Total 6 Click Score: 7    End of Session Equipment Utilized During Treatment: Gait belt Activity Tolerance: Patient limited by fatigue Patient left: in bed;with call bell/phone within reach;with bed alarm set;with family/visitor present Nurse Communication: Mobility status PT Visit Diagnosis: Muscle weakness (generalized) (M62.81);Other abnormalities of gait and mobility (R26.89);Unsteadiness on feet (R26.81);Adult, failure to thrive (R62.7)     Time: 4287-6811 PT Time Calculation (min) (ACUTE ONLY): 30 min  Charges:  $Therapeutic Activity: 23-37 mins                    G Codes:       Tifanie Gardiner B. Migdalia Dk PT, DPT Acute Rehabilitation  [phone] Pager [phone]     [place] 10/08/2016, 4:07 PM

## 2016-10-09 LAB — GLUCOSE, CAPILLARY
GLUCOSE-CAPILLARY: 112 mg/dL — AB (ref 65–99)
GLUCOSE-CAPILLARY: 122 mg/dL — AB (ref 65–99)
GLUCOSE-CAPILLARY: 126 mg/dL — AB (ref 65–99)
GLUCOSE-CAPILLARY: 96 mg/dL (ref 65–99)

## 2016-10-09 LAB — CBC
HEMATOCRIT: 30.5 % — AB (ref 36.0–46.0)
HEMOGLOBIN: 9.5 g/dL — AB (ref 12.0–15.0)
MCH: 30.6 pg (ref 26.0–34.0)
MCHC: 31.1 g/dL (ref 30.0–36.0)
MCV: 98.4 fL (ref 78.0–100.0)
Platelets: 62 10*3/uL — ABNORMAL LOW (ref 150–400)
RBC: 3.1 MIL/uL — AB (ref 3.87–5.11)
RDW: 19.7 % — ABNORMAL HIGH (ref 11.5–15.5)
WBC: 5.3 10*3/uL (ref 4.0–10.5)

## 2016-10-09 LAB — COMPREHENSIVE METABOLIC PANEL
ALBUMIN: 1.1 g/dL — AB (ref 3.5–5.0)
ALT: 15 U/L (ref 14–54)
ANION GAP: 8 (ref 5–15)
AST: 28 U/L (ref 15–41)
Alkaline Phosphatase: 308 U/L — ABNORMAL HIGH (ref 38–126)
BUN: 21 mg/dL — AB (ref 6–20)
CHLORIDE: 107 mmol/L (ref 101–111)
CO2: 25 mmol/L (ref 22–32)
Calcium: 7.1 mg/dL — ABNORMAL LOW (ref 8.9–10.3)
Creatinine, Ser: 0.79 mg/dL (ref 0.44–1.00)
GFR calc Af Amer: >60 mL/min (ref >60)
Glucose, Bld: 115 mg/dL — ABNORMAL HIGH (ref 65–99)
POTASSIUM: 4.3 mmol/L (ref 3.5–5.1)
Sodium: 140 mmol/L (ref 135–145)
TOTAL PROTEIN: 4.1 g/dL — AB (ref 6.5–8.1)
Total Bilirubin: 0.7 mg/dL (ref 0.3–1.2)

## 2016-10-09 NOTE — Progress Notes (Signed)
Occupational Therapy Treatment Patient Details Name: Haley Graham MRN: [id number] DOB: 04-Nov-1955 Today's Date: 10/09/2016    History of present illness This 61 y.o. female admitted with sepsis, hypotension, dehydration, amemia, COPD, AKI due to poor oral intake and hypotension/dehydration.  PMH  includes:  Recent prolonged hospitalization for [place] type B aortic dissection s/p descending thoracic aortic stent graft placement; anxiety, GERD, COPD,    OT comments  This 61 yo female admitted with above seen today with flat affect still, but when I told her what we were going to do her response was "I'll walk to the door and back". Well we didn't quite accomplish this, but she did tolerate up to sit, sitting EOB, sit<>stand x4 and standing for ~20 seconds x2. She will continue to benefit from acute OT with follow up OT at SNF.  Follow Up Recommendations  SNF;Supervision/Assistance - 24 hour    Equipment Recommendations  None recommended by OT       Precautions / Restrictions Precautions Precautions: Fall Precaution Comments: cortrak Restrictions Weight Bearing Restrictions: No       Mobility Bed Mobility Overal bed mobility: Needs Assistance Bed Mobility: Supine to Sit     Supine to sit: Mod assist;+2 for safety/equipment        Transfers Overall transfer level: Needs assistance Equipment used: 2 person hand held assist Denna Haggard) Transfers: Sit to/from Stand Sit to Stand: +2 physical assistance;Mod assist              Balance Overall balance assessment: Needs assistance Sitting-balance support: Feet supported;Bilateral upper extremity supported Sitting balance-Leahy Scale: Poor Sitting balance - Comments: Min guard assist EOB to maintain sitting balance with bil UE supported.    Standing balance support: Bilateral upper extremity supported Standing balance-Leahy Scale: Poor Standing balance comment: With Bil UE support and support on either side, pt able to  stand with use of Stedy                           ADL either performed or assessed with clinical judgement   ADL                                         General ADL Comments: Worked on precursors to basic ADLs today of coming up to sit, sitting EOB, sit<>stand from bed with Stedy, sit<>stand while on Stedy x3, and maintaining standing with use of Stedy     Vision Patient Visual Report: No change from baseline            Cognition Arousal/Alertness: Awake/alert Behavior During Therapy: Flat affect;Anxious Overall Cognitive Status: Impaired/Different from baseline Area of Impairment: Following commands;Problem solving                       Following Commands: Follows one step commands consistently Safety/Judgement: Decreased awareness of safety (due to anxiousness)   Problem Solving: Slow processing;Requires verbal cues                     Pertinent Vitals/ Pain       Pain Assessment: No/denies pain         Frequency  Min 2X/week        Progress Toward Goals  OT Goals(current goals can now be found in the care plan section)  Progress towards OT goals: Progressing toward  goals     Plan Discharge plan remains appropriate       AM-PAC PT "6 Clicks" Daily Activity     Outcome Measure   Help from another person eating meals?: Total (NPO) Help from another person taking care of personal grooming?: A Little Help from another person toileting, which includes using toliet, bedpan, or urinal?: A Lot Help from another person bathing (including washing, rinsing, drying)?: A Lot Help from another person to put on and taking off regular upper body clothing?: A Lot Help from another person to put on and taking off regular lower body clothing?: Total 6 Click Score: 11    End of Session    OT Visit Diagnosis: Unsteadiness on feet (R26.81);Muscle weakness (generalized) (M62.81)   Activity Tolerance Patient limited by fatigue  (limited to anxiousness)   Patient Left in bed;with call bell/phone within reach;with bed alarm set   Nurse Communication Mobility status;Patient requests pain meds (pt asked for anti-nausea meds)        Time: 7858-8502 OT Time Calculation (min): 34 min  Charges: OT General Charges $OT Visit: 1 Visit OT Treatments $Therapeutic Activity: 23-37 mins [place] Circle, OTR/L [phone] 10/09/2016

## 2016-10-09 NOTE — Progress Notes (Signed)
Clinical Social Worker following patient for disposition plan. Once patient is stable for discharge she will be able to go to [facility].   Rhea Pink, MSW,  [place]

## 2016-10-09 NOTE — Progress Notes (Addendum)
PROGRESS NOTE    Haley LAMPE  TKZ:[id number] DOB: 01-11-1956 DOA: 09/15/2016 PCP: Glenda Chroman, MD   Brief Narrative:  61 y.o.femalewith medical history significant of [place] type B aortic dissection status post descending thoracic aortic stent graft placement per vascular surgery during last hospitalization from 06/20/2016-08/22/2016, Zaydn Gutridge history of diabetes mellitus with gastroparesis, gastroesophageal reflux disease, hypertension, IBS, abdominal aortic aneurysm, anxiety, depression presented with generalized weakness and sepsis on admission. The blood culture grwing is to therefore started on fluconazole for candidemia. Evaluated by ID. ID recommended continuing fluconazole 400 mg daily through 9/26. Patient now with congestive heart failure, hypoxia and echo showed reduced EF of 30-35% with akinesis of mid-apicalanteroseptal and anterior myocardium.   Assessment & Plan:   Principal Problem:   Candidemia ([place]) Active Problems:   GERD (gastroesophageal reflux disease)   Dissection of thoracoabdominal aorta (HCC)   AAA (abdominal aortic aneurysm) (HCC)   Benign essential HTN   Diabetes mellitus type 2 in obese (HCC)   Anxiety state   Hyponatremia   Leukocytosis   Sepsis (HCC)   Hypotension   AKI (acute kidney injury) ([place])   Dehydration   Generalized weakness   Anemia   Thrombocytopenia (HCC)   Abnormal computed tomography angiography (CTA) of abdomen and pelvis   Colitis: Probable   Protein-calorie malnutrition, severe   Palliative care by specialist   Adult failure to thrive   Hypophosphatemia   Hypomagnesemia   Depression   Acute systolic CHF (congestive heart failure) ([place])   DNR (do not resuscitate) discussion   Congestive heart failure ([place])   Heme positive stool   Cardiac LV ejection fraction 30-35%   QT prolongation   # Sepsis due to candidemia: -Continue fluconazole until September 26 and needs follow-up with ophthalmologist in [place] month as per  infectious disease. [ ]  discussed with Dr. Valetta Close 9/7 who recommended follow up after discharge [ ]  some concern for thrush, she does have white lesions over her tongue, is on fluconazole, will continue to monitor for now  #Acute systolic congestive heart failure:  - resume lasix 20 mg PO daily, I/O's daily weights [ ]  repeat echo with EF 35-40% and grade 2 diastolic dysfunction as well as hypokinesis of mid-apicalanteroseptal, anterior, and inferolateral myocardium (mildly improved from 8/28 echo at 30-35%) [ ]  cardiology rec f/u in clinic in 4-6 weeks with repeat echo - may need Lexiscan Myoview versus cath. Denied shortness of breath or chest pain today. Continue to monitor urine output and electrolytes. Cardiology consult appreciated. - on beta blocker, consider ace/arb in future  #Prolonged QTC: Increased today, she has received zofran recently, will hold this for now.  Discontinue Azithromycin and abilify. Monitor in telemetry. Repeat EKG.  #Severe protein calorie malnutrition: Patient is on NG tube because of decreased oral intake and severe malnutrition. Evaluated by  swallow team. Currently on tube feeding, encourage oral intake. Palliative care is following.  Will need to discuss plans for long term nutrition.  Nutrition recommending long term feeding access.  Discussed with patient and family.    #Acute kidney injury - Improved, restart lasix tomorrow 20 mg PO.    #Acute on Chronic anemia likely due to chronic disease vs acute GI bleed: Iron stores acceptable. Fecal occult blood test positive. On Protonix. Status post 2 unit of red blood cell transfusion on 8/30 with improvement in hemoglobin. Evaluated by GI. No further endoscopy at this time. Hemoglobin level stable. -Monitor CBC.  #Nausea and abdominal discomfort: Abdominal x-ray consistent with  adynamic ileus. Evaluated by GI. Patient is tolerating tube feeds. Continue Zofran and symptomatic treatment. Unable to take reglan  because of risk of QT prolongation. Clinically stable and denies abdominal pain today.  #Thrombocytopenia likely due to acute illness:  Downtrending today. No sign of bleeding. Monitor CBC. Platelet count up and down over past few days. Monitor labs  #Anxiety depression: Evaluated by psychiatrist. -Continue Xanax, Cymbalta andVyvanse per psychiatrist  # hypokalemia/hypomagnesemia: Repleted magnesium and potassium chloride. Monitor labs.  # Loose stools: follow up c diff.   # Delirium:  Delirium prec, ctm  DVT prophylaxis: SCDs in place Code Status: full code Family Communication: will call husband [name]  Disposition Plan: SNF    Consultants:   Cardiology, palliative, nutrition, GI, ID, psychiatry   Procedures: (Don't include imaging studies which can be auto populated. Include things that cannot be auto populated i.e. Echo, Carotid and venous dopplers, Foley, Bipap, HD, tubes/drains, wound vac, central lines etc)  Thoracentesis 8/17  Echo 8/28 EF 30-35%, akinesis of mid-apicalanteroseptal and anterior myocardium. Diastolic dysfunction.  Echo 8/13 EF 48-18, diastolic dysfunction, lipomatous hypertrophy  Echo 9/4 EF 35-40%, hypokinesis of mid apicalanteroseptal, anterior, and inferolateral myocardium, grade 2 diastolic dysfunction  Antimicrobials: (specify start and planned stop date. Auto populated tables are space occupying and do not give end dates)  fluconazole    Subjective: No CP, SOB.  Getting ready to work with PT.   Objective: Vitals:   10/09/16 0500 10/09/16 0600 10/09/16 0843 10/09/16 1237  BP:   (!) 124/57 116/61  Pulse:   98   Resp: 15 14 16 17   Temp:   98.4 F (36.9 C)   TempSrc:   Axillary Oral  SpO2: 97% 98% 99% 99%  Weight:      Height:        Intake/Output Summary (Last 24 hours) at 10/09/16 1322 Last data filed at 10/08/16 2000  Gross per 24 hour  Intake                0 ml  Output              350 ml  Net             -350 ml    Filed Weights   10/04/16 0511 10/06/16 0600 10/09/16 0400  Weight: 72.6 kg (160 lb) 65.7 kg (144 lb 12.8 oz) 67.8 kg (149 lb 6.4 oz)    Examination:  General: No acute distress. Cardiovascular: Heart sounds show Burleigh Brockmann regular rate, and rhythm. No gallops or rubs. No murmurs. No JVD. Lungs: Clear to auscultation bilaterally with good air movement. No rales, rhonchi or wheezes. Abdomen: Soft, nondistended with normal active bowel sounds. No masses. No hepatosplenomegaly.  Mild tenderness.  NG in place.  Neurological: Alert and oriented 3. Moves all extremities 4 with equal strength. Cranial nerves II through XII grossly intact. Skin: Warm and dry. No rashes or lesions. Extremities: No clubbing or cyanosis. No edema. Pedal pulses 2+.    Data Reviewed: I have personally reviewed following labs and imaging studies  CBC:  Recent Labs Lab 10/05/16 0251 10/06/16 0939 10/07/16 0508 10/08/16 0230 10/09/16 0559  WBC 5.2 5.7 4.1 4.7 5.3  HGB 10.9* 10.1* 10.6* 9.9* 9.5*  HCT 34.2* 32.0* 33.3* 31.7* 30.5*  MCV 98.6 97.6 98.5 99.1 98.4  PLT 81* 75* 68* 70* 62*   Basic Metabolic Panel:  Recent Labs Lab 10/03/16 0547 10/05/16 0251 10/06/16 0939 10/07/16 0508 10/08/16 0230 10/09/16 0318  NA 135 137  137 137 137 140  K 4.5 3.6 3.3* 4.6 4.5 4.3  CL 103 103 101 103 104 107  CO2 22 27 29 27 27 25   GLUCOSE 76 93 115* 96 97 115*  BUN 10 8 13 15 17  21*  CREATININE 0.79 0.76 0.87 1.31* 0.81 0.79  CALCIUM 7.0* 7.0* 6.9* 7.1* 7.1* 7.1*  MG 1.8 1.2* 1.5* 1.8  --   --    GFR: Estimated Creatinine Clearance: 69.2 mL/min (by C-G formula based on SCr of 0.79 mg/dL). Liver Function Tests:  Recent Labs Lab 10/08/16 0230 10/09/16 0318  AST 24 28  ALT 16 15  ALKPHOS 284* 308*  BILITOT 0.8 0.7  PROT 4.4* 4.1*  ALBUMIN 1.2* 1.1*   No results for input(s): LIPASE, AMYLASE in the last 168 hours. No results for input(s): AMMONIA in the last 168 hours. Coagulation Profile: No results  for input(s): INR, PROTIME in the last 168 hours. Cardiac Enzymes: No results for input(s): CKTOTAL, CKMB, CKMBINDEX, TROPONINI in the last 168 hours. BNP (last 3 results) No results for input(s): PROBNP in the last 8760 hours. HbA1C: No results for input(s): HGBA1C in the last 72 hours. CBG:  Recent Labs Lab 10/08/16 1621 10/08/16 2114 10/09/16 0415 10/09/16 0651 10/09/16 1118  GLUCAP 121* 113* 112* 122* 126*   Lipid Profile: No results for input(s): CHOL, HDL, LDLCALC, TRIG, CHOLHDL, LDLDIRECT in the last 72 hours. Thyroid Function Tests: No results for input(s): TSH, T4TOTAL, FREET4, T3FREE, THYROIDAB in the last 72 hours. Anemia Panel: No results for input(s): VITAMINB12, FOLATE, FERRITIN, TIBC, IRON, RETICCTPCT in the last 72 hours. Sepsis Labs: No results for input(s): PROCALCITON, LATICACIDVEN in the last 168 hours.  Recent Results (from the past 240 hour(s))  C difficile quick screen w PCR reflex     Status: None   Collection Time: 10/07/16 11:58 AM  Result Value Ref Range Status   C Diff antigen NEGATIVE NEGATIVE Final   C Diff toxin NEGATIVE NEGATIVE Final   C Diff interpretation No C. difficile detected.  Final         Radiology Studies: No results found.      Scheduled Meds: . ALPRAZolam  0.25 mg Oral QHS  . aspirin EC  81 mg Oral Daily  . dronabinol  5 mg Oral BID AC  . DULoxetine  60 mg Oral Daily  . feeding supplement (ENSURE ENLIVE)  237 mL Oral TID BM  . feeding supplement (PRO-STAT SUGAR FREE 64)  30 mL Per Tube TID  . feeding supplement (VITAL 1.5 CAL)  1,000 mL Per Tube Q24H  . fluconazole  400 mg Oral Daily  . furosemide  20 mg Oral Daily  . insulin aspart  0-5 Units Subcutaneous QHS  . insulin aspart  0-9 Units Subcutaneous TID WC  . lisdexamfetamine  20 mg Oral Daily  . metoprolol tartrate  25 mg Oral BID  . pantoprazole  40 mg Oral QHS  . potassium chloride  40 mEq Oral BID  . sodium chloride flush  3 mL Intravenous Q12H    Continuous Infusions:   LOS: 30 days    Time spent: 35 min    Jermarion Poffenberger Melven Sartorius, MD [facility] [phone]   If 7PM-7AM, please contact night-coverage [email or web address] Password [email or web address] 10/09/2016, 1:22 PM

## 2016-10-10 LAB — COMPREHENSIVE METABOLIC PANEL
ALBUMIN: 1.1 g/dL — AB (ref 3.5–5.0)
ALK PHOS: 309 U/L — AB (ref 38–126)
ALT: 15 U/L (ref 14–54)
AST: 21 U/L (ref 15–41)
Anion gap: 6 (ref 5–15)
BILIRUBIN TOTAL: 0.5 mg/dL (ref 0.3–1.2)
BUN: 19 mg/dL (ref 6–20)
CALCIUM: 7.2 mg/dL — AB (ref 8.9–10.3)
CO2: 25 mmol/L (ref 22–32)
Chloride: 108 mmol/L (ref 101–111)
Creatinine, Ser: 0.76 mg/dL (ref 0.44–1.00)
GFR calc Af Amer: >60 mL/min (ref >60)
GFR calc non Af Amer: >60 mL/min (ref >60)
GLUCOSE: 126 mg/dL — AB (ref 65–99)
Potassium: 4 mmol/L (ref 3.5–5.1)
Sodium: 139 mmol/L (ref 135–145)
TOTAL PROTEIN: 4.2 g/dL — AB (ref 6.5–8.1)

## 2016-10-10 LAB — CBC
HEMATOCRIT: 30 % — AB (ref 36.0–46.0)
Hemoglobin: 9.4 g/dL — ABNORMAL LOW (ref 12.0–15.0)
MCH: 30.8 pg (ref 26.0–34.0)
MCHC: 31.3 g/dL (ref 30.0–36.0)
MCV: 98.4 fL (ref 78.0–100.0)
Platelets: 55 10*3/uL — ABNORMAL LOW (ref 150–400)
RBC: 3.05 MIL/uL — AB (ref 3.87–5.11)
RDW: 19.6 % — AB (ref 11.5–15.5)
WBC: 6.3 10*3/uL (ref 4.0–10.5)

## 2016-10-10 LAB — GLUCOSE, CAPILLARY
Glucose-Capillary: 112 mg/dL — ABNORMAL HIGH (ref 65–99)
Glucose-Capillary: 110 mg/dL — ABNORMAL HIGH (ref 65–99)
Glucose-Capillary: 115 mg/dL — ABNORMAL HIGH (ref 65–99)

## 2016-10-10 LAB — MAGNESIUM: Magnesium: 1.4 mg/dL — ABNORMAL LOW (ref 1.7–2.4)

## 2016-10-10 MED ORDER — MAGNESIUM SULFATE 4 GM/100ML IV SOLN
4.0000 g | Freq: Once | INTRAVENOUS | Status: AC
  Administered 2016-10-10: 4 g via INTRAVENOUS
  Filled 2016-10-10: qty 100

## 2016-10-10 MED ORDER — ONDANSETRON 4 MG PO TBDP
4.0000 mg | ORAL_TABLET | Freq: Once | ORAL | Status: DC | PRN
  Filled 2016-10-10: qty 1

## 2016-10-10 MED ORDER — POTASSIUM CHLORIDE 20 MEQ/15ML (10%) PO SOLN
40.0000 meq | Freq: Two times a day (BID) | ORAL | Status: DC
  Administered 2016-10-11 – 2016-10-12 (×3): 40 meq
  Filled 2016-10-10 (×4): qty 30

## 2016-10-10 MED ORDER — DIPHENHYDRAMINE HCL 25 MG PO CAPS
25.0000 mg | ORAL_CAPSULE | Freq: Once | ORAL | Status: AC | PRN
  Administered 2016-10-17: 25 mg via ORAL
  Filled 2016-10-10: qty 1

## 2016-10-10 MED ORDER — SCOPOLAMINE 1 MG/3DAYS TD PT72
1.0000 | MEDICATED_PATCH | TRANSDERMAL | Status: DC
  Administered 2016-10-10 – 2016-10-25 (×6): 1.5 mg via TRANSDERMAL
  Filled 2016-10-10 (×7): qty 1

## 2016-10-10 NOTE — Progress Notes (Signed)
RN attempted to give patient potassium and prostat. Patient refused to take either one. Husband explained to the patient that if she does not take them then she will have to stay longer. Patient still refused. Patient stated that, "she told the MD that she didn't want to take the potassium anymore". The patient also stated that, "the MD said she did not have to take it". Will continue to  monitor

## 2016-10-10 NOTE — Progress Notes (Signed)
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 The blood culture grwing is to therefore started on fluconazole for candidemia. Evaluated by ID. ID recommended continuing fluconazole 400 mg daily through 9/26. Patient now with congestive heart failure, hypoxia and echo showed reduced EF of 30-35% with akinesis of mid-apicalanteroseptal and anterior myocardium.   Assessment & Plan:   Principal Problem:   Candidemia ([place]) Active Problems:   GERD (gastroesophageal reflux disease)   Dissection of thoracoabdominal aorta (HCC)   AAA (abdominal aortic aneurysm) (HCC)   Benign essential HTN   Diabetes mellitus type 2 in obese (HCC)   Anxiety state   Hyponatremia   Leukocytosis   Sepsis (HCC)   Hypotension   AKI (acute kidney injury) ([place])   Dehydration   Generalized weakness   Anemia   Thrombocytopenia (HCC)   Abnormal computed tomography angiography (CTA) of abdomen and pelvis   Colitis: Probable   Protein-calorie malnutrition, severe   Palliative care by specialist   Adult failure to thrive   Hypophosphatemia   Hypomagnesemia   Depression   Acute systolic CHF (congestive heart failure) ([place])   DNR (do not resuscitate) discussion   Congestive heart failure ([place])   Heme positive stool   Cardiac LV ejection fraction 30-35%   QT prolongation   # Sepsis due to candidemia: -Continue fluconazole until September 26 and needs follow-up with ophthalmologist in [place] Haley Graham month as per  infectious disease. [ ]  discussed with Dr. Valetta Graham 9/7 who recommended follow up after discharge [ ]  some concern for thrush, she does have white lesions over her tongue, is on fluconazole, will continue to monitor for now as her fluconazole is covering the candidemia, may consider adding something topical if persistent.     #Acute systolic congestive heart failure:  - resume lasix 20 mg PO daily, I/O's daily weights [ ]  repeat echo with EF 35-40% and grade 2 diastolic dysfunction as well as hypokinesis of mid-apicalanteroseptal, anterior, and inferolateral myocardium (mildly improved from 8/28 echo at 30-35%) [ ]  cardiology rec f/u in clinic in 4-6 weeks with repeat echo - may need Lexiscan Myoview versus cath. Denied shortness of breath or chest pain today. Continue to monitor urine output and electrolytes. Cardiology consult appreciated. - on beta blocker, consider ace/arb in future  #Prolonged QTC: Increased further to 521.  Zofran discontinued.  Discontinue Azithromycin and abilify.  Still on cymbalta. Monitor in telemetry. Repeat EKG. [ ]  Magnesium  #Severe protein calorie malnutrition: Patient is on NG tube because of decreased oral intake and severe malnutrition. Evaluated by  swallow team. Currently on tube feeding, encourage oral intake. Palliative care is following.  Will need to discuss plans for long term nutrition.  Nutrition recommending long term feeding access.  Mrs. Haley Graham wants to discuss this today with her husband.   #Acute kidney injury - Improved  #Acute on Chronic anemia likely due to chronic disease vs acute GI bleed: Iron stores acceptable. Fecal occult blood test positive. On Protonix. Status post 2 unit of red blood cell transfusion on 8/30 with improvement in hemoglobin. Evaluated by GI. No further endoscopy at this  time. Hemoglobin level stable. -Monitor CBC.  #Nausea and abdominal discomfort: Abdominal x-ray consistent with adynamic ileus. Evaluated by GI.  Patient is tolerating tube feeds. Continue Zofran and symptomatic treatment. Unable to take reglan because of risk of QT prolongation. Clinically stable and denies abdominal pain today.  #Thrombocytopenia likely due to acute illness:  Downtrending further to 55, ctm. No sign of bleeding. Monitor CBC. Platelet count up and down over past few days. Monitor labs  #Anxiety depression: Evaluated by psychiatrist. -Continue Haley Graham, Cymbalta andVyvanse per psychiatrist  # hypokalemia/hypomagnesemia: Repleted magnesium and potassium chloride. Monitor labs.  # Loose stools: follow up c diff.   # Delirium:  Delirium prec, ctm  DVT prophylaxis: SCDs in place Code Status: full code Family Communication: will call husband Haley Graham  Disposition Plan: SNF    Consultants:   Cardiology, palliative, nutrition, GI, ID, psychiatry   Procedures: (Don't include imaging studies which can be auto populated. Include things that cannot be auto populated i.e. Echo, Carotid and venous dopplers, Foley, Bipap, HD, tubes/drains, wound vac, central lines etc)  Thoracentesis 8/17  Echo 8/28 EF 30-35%, akinesis of mid-apicalanteroseptal and anterior myocardium. Diastolic dysfunction.  Echo 8/13 EF 81-19, diastolic dysfunction, lipomatous hypertrophy  Echo 9/4 EF 35-40%, hypokinesis of mid apicalanteroseptal, anterior, and inferolateral myocardium, grade 2 diastolic dysfunction  Antimicrobials: (specify start and planned stop date. Auto populated tables are space occupying and do not give end dates)  fluconazole    Subjective: Had nausea earlier.  Improved now.  No CP or SOB.  Curious about the plan.  Encouraged she got up to walk earlier.    Objective: Vitals:   10/10/16 0642 10/10/16 0829 10/10/16 0900 10/10/16 1122  BP:  124/64 106/68 121/70  Pulse:   (!) 102 98  Resp:  20 16 16   Temp:  98 F (36.7 C) 97.9 F (36.6 C) 98 F (36.7 C)  TempSrc:  Oral Oral Oral  SpO2:  100% 98% 97%  Weight: 69.5  kg (153 lb 3.5 oz)     Height:        Intake/Output Summary (Last 24 hours) at 10/10/16 1315 Last data filed at 10/10/16 0859  Gross per 24 hour  Intake              100 ml  Output             1150 ml  Net            -1050 ml   Filed Weights   10/06/16 0600 10/09/16 0400 10/10/16 0642  Weight: 65.7 kg (144 lb 12.8 oz) 67.8 kg (149 lb 6.4 oz) 69.5 kg (153 lb 3.5 oz)    Examination:  General: No acute distress. Cardiovascular: Heart sounds show Daemon Dowty regular rate, and rhythm. No gallops or rubs. No murmurs. No JVD. Lungs: Clear to auscultation bilaterally with good air movement. No rales, rhonchi or wheezes. Abdomen: Soft, mildly tender, nondistended with normal active bowel sounds. No masses. No hepatosplenomegaly. NG in place Neurological: Alert and oriented 3. Moves all extremities 4 with equal strength. Cranial nerves II through XII grossly intact. Skin: Warm and dry. No rashes or lesions.  Bruising Extremities: No clubbing or cyanosis. Trace edema, TED hose. Pedal pulses 2+.  Data Reviewed: I have personally reviewed following labs and imaging studies  CBC:  Recent Labs Lab 10/06/16 0939 10/07/16 0508 10/08/16 0230 10/09/16 0559 10/10/16 0343  WBC 5.7 4.1 4.7 5.3 6.3  HGB 10.1* 10.6* 9.9* 9.5* 9.4*  HCT 32.0* 33.3* 31.7*  30.5* 30.0*  MCV 97.6 98.5 99.1 98.4 98.4  PLT 75* 68* 70* 62* 55*   Basic Metabolic Panel:  Recent Labs Lab 10/05/16 0251 10/06/16 0939 10/07/16 0508 10/08/16 0230 10/09/16 0318 10/10/16 0343  NA 137 137 137 137 140 139  K 3.6 3.3* 4.6 4.5 4.3 4.0  CL 103 101 103 104 107 108  CO2 27 29 27 27 25 25   GLUCOSE 93 115* 96 97 115* 126*  BUN 8 13 15 17  21* 19  CREATININE 0.76 0.87 1.31* 0.81 0.79 0.76  CALCIUM 7.0* 6.9* 7.1* 7.1* 7.1* 7.2*  MG 1.2* 1.5* 1.8  --   --   --    GFR: Estimated Creatinine Clearance: 69.9 mL/min (by C-G formula based on SCr of 0.76 mg/dL). Liver Function Tests:  Recent Labs Lab 10/08/16 0230 10/09/16 0318  10/10/16 0343  AST 24 28 21   ALT 16 15 15   ALKPHOS 284* 308* 309*  BILITOT 0.8 0.7 0.5  PROT 4.4* 4.1* 4.2*  ALBUMIN 1.2* 1.1* 1.1*   No results for input(s): LIPASE, AMYLASE in the last 168 hours. No results for input(s): AMMONIA in the last 168 hours. Coagulation Profile: No results for input(s): INR, PROTIME in the last 168 hours. Cardiac Enzymes: No results for input(s): CKTOTAL, CKMB, CKMBINDEX, TROPONINI in the last 168 hours. BNP (last 3 results) No results for input(s): PROBNP in the last 8760 hours. HbA1C: No results for input(s): HGBA1C in the last 72 hours. CBG:  Recent Labs Lab 10/09/16 0415 10/09/16 0651 10/09/16 1118 10/09/16 2051 10/10/16 0604  GLUCAP 112* 122* 126* 96 112*   Lipid Profile: No results for input(s): CHOL, HDL, LDLCALC, TRIG, CHOLHDL, LDLDIRECT in the last 72 hours. Thyroid Function Tests: No results for input(s): TSH, T4TOTAL, FREET4, T3FREE, THYROIDAB in the last 72 hours. Anemia Panel: No results for input(s): VITAMINB12, FOLATE, FERRITIN, TIBC, IRON, RETICCTPCT in the last 72 hours. Sepsis Labs: No results for input(s): PROCALCITON, LATICACIDVEN in the last 168 hours.  Recent Results (from the past 240 hour(s))  C difficile quick screen w PCR reflex     Status: None   Collection Time: 10/07/16 11:58 AM  Result Value Ref Range Status   C Diff antigen NEGATIVE NEGATIVE Final   C Diff toxin NEGATIVE NEGATIVE Final   C Diff interpretation No C. difficile detected.  Final         Radiology Studies: No results found.      Scheduled Meds: . ALPRAZolam  0.25 mg Oral QHS  . aspirin EC  81 mg Oral Daily  . dronabinol  5 mg Oral BID AC  . DULoxetine  60 mg Oral Daily  . feeding supplement (ENSURE ENLIVE)  237 mL Oral TID BM  . feeding supplement (PRO-STAT SUGAR FREE 64)  30 mL Per Tube TID  . feeding supplement (VITAL 1.5 CAL)  1,000 mL Per Tube Q24H  . fluconazole  400 mg Oral Daily  . furosemide  20 mg Oral Daily  . insulin  aspart  0-5 Units Subcutaneous QHS  . insulin aspart  0-9 Units Subcutaneous TID WC  . lisdexamfetamine  20 mg Oral Daily  . metoprolol tartrate  25 mg Oral BID  . pantoprazole  40 mg Oral QHS  . potassium chloride  40 mEq Oral BID  . scopolamine  1 patch Transdermal Q72H  . sodium chloride flush  3 mL Intravenous Q12H   Continuous Infusions:   LOS: 31 days    Time spent: 35 min  Shawnna Pancake Melven Sartorius, MD [facility] [phone]   If 7PM-7AM, please contact night-coverage [email or web address] Password [email or web address] 10/10/2016, 1:15 PM

## 2016-10-10 NOTE — Progress Notes (Signed)
Pt seemed more willing to comply with care at the start of the shift.  As the day progressed her effect became flat and she was crying off and on.  Many times she mentioned wanting to go home, husband and I educated patient on the importance of taking medication and getting up at least to her chair in order to move towards her goal of going home. Husband states she was compliant for the first week after discharge but "became dehydrated and started to decline".

## 2016-10-11 ENCOUNTER — Other Ambulatory Visit: Payer: Self-pay

## 2016-10-11 LAB — COMPREHENSIVE METABOLIC PANEL
ALBUMIN: 1.1 g/dL — AB (ref 3.5–5.0)
ALT: 16 U/L (ref 14–54)
AST: 22 U/L (ref 15–41)
Alkaline Phosphatase: 318 U/L — ABNORMAL HIGH (ref 38–126)
Anion gap: 7 (ref 5–15)
BILIRUBIN TOTAL: 0.5 mg/dL (ref 0.3–1.2)
BUN: 18 mg/dL (ref 6–20)
CHLORIDE: 105 mmol/L (ref 101–111)
CO2: 25 mmol/L (ref 22–32)
CREATININE: 0.7 mg/dL (ref 0.44–1.00)
Calcium: 7.1 mg/dL — ABNORMAL LOW (ref 8.9–10.3)
GFR calc Af Amer: >60 mL/min (ref >60)
GFR calc non Af Amer: >60 mL/min (ref >60)
GLUCOSE: 124 mg/dL — AB (ref 65–99)
POTASSIUM: 4.4 mmol/L (ref 3.5–5.1)
Sodium: 137 mmol/L (ref 135–145)
TOTAL PROTEIN: 4.4 g/dL — AB (ref 6.5–8.1)

## 2016-10-11 LAB — CBC
HCT: 31.1 % — ABNORMAL LOW (ref 36.0–46.0)
Hemoglobin: 9.8 g/dL — ABNORMAL LOW (ref 12.0–15.0)
MCH: 31.1 pg (ref 26.0–34.0)
MCHC: 31.5 g/dL (ref 30.0–36.0)
MCV: 98.7 fL (ref 78.0–100.0)
Platelets: 68 10*3/uL — ABNORMAL LOW (ref 150–400)
RBC: 3.15 MIL/uL — AB (ref 3.87–5.11)
RDW: 19.1 % — AB (ref 11.5–15.5)
WBC: 6.4 10*3/uL (ref 4.0–10.5)

## 2016-10-11 LAB — GLUCOSE, CAPILLARY
GLUCOSE-CAPILLARY: 120 mg/dL — AB (ref 65–99)
GLUCOSE-CAPILLARY: 124 mg/dL — AB (ref 65–99)
GLUCOSE-CAPILLARY: 109 mg/dL — AB (ref 65–99)
Glucose-Capillary: 118 mg/dL — ABNORMAL HIGH (ref 65–99)

## 2016-10-11 LAB — MAGNESIUM: Magnesium: 2.2 mg/dL (ref 1.7–2.4)

## 2016-10-11 MED ORDER — ALUM & MAG HYDROXIDE-SIMETH 200-200-20 MG/5ML PO SUSP
30.0000 mL | Freq: Four times a day (QID) | ORAL | Status: DC | PRN
  Administered 2016-10-11 – 2016-10-21 (×3): 30 mL via ORAL
  Filled 2016-10-11 (×4): qty 30

## 2016-10-11 MED ORDER — VITAL 1.5 CAL PO LIQD
1000.0000 mL | ORAL | Status: DC
  Administered 2016-10-12: 1000 mL
  Filled 2016-10-11 (×3): qty 1000

## 2016-10-11 MED ORDER — ALPRAZOLAM 0.25 MG PO TABS
0.2500 mg | ORAL_TABLET | Freq: Once | ORAL | Status: AC
  Administered 2016-10-11: 0.25 mg via ORAL
  Filled 2016-10-11: qty 1

## 2016-10-11 NOTE — Progress Notes (Signed)
Dr. Florene Glen came through to talk to patient and her husband [name]) about the progression of her treatment and more conversation about placing a permanent feeding tube.  Patient stated she was not ready for that option and husband clarfied her concerns, patient stated that she did not want it to be uncomfortable. Family decided to discuss it further in detail.

## 2016-10-11 NOTE — Progress Notes (Signed)
Patient was able to ambulate twice today, once before noon around the room and a second time to stand and pivot to bedside commode. Patient also took her K+ via tube with no n/v and though she refused her first Ensure drank the second one.  Better spirits as well.

## 2016-10-11 NOTE — Progress Notes (Signed)
Progress Note  Patient Name: Haley Graham Date of Encounter: 10/11/2016  Primary Cardiologist: Dr Sallyanne Kuster  Subjective   Doing well today, the patient denies CP or SOB. + loose stools, she was able to walk today.  No new concerns  Inpatient Medications    Scheduled Meds: . ALPRAZolam  0.25 mg Oral QHS  . aspirin EC  81 mg Oral Daily  . dronabinol  5 mg Oral BID AC  . feeding supplement (ENSURE ENLIVE)  237 mL Oral TID BM  . feeding supplement (PRO-STAT SUGAR FREE 64)  30 mL Per Tube TID  . [START ON 10/12/2016] feeding supplement (VITAL 1.5 CAL)  1,000 mL Per Tube Q24H  . fluconazole  400 mg Oral Daily  . furosemide  20 mg Oral Daily  . insulin aspart  0-5 Units Subcutaneous QHS  . insulin aspart  0-9 Units Subcutaneous TID WC  . lisdexamfetamine  20 mg Oral Daily  . metoprolol tartrate  25 mg Oral BID  . pantoprazole  40 mg Oral QHS  . potassium chloride  40 mEq Per Tube BID  . scopolamine  1 patch Transdermal Q72H  . sodium chloride flush  3 mL Intravenous Q12H   Continuous Infusions:  PRN Meds: acetaminophen **OR** acetaminophen, alum & mag hydroxide-simeth, diphenhydrAMINE, fluticasone, ipratropium-albuterol, lidocaine, menthol-cetylpyridinium, senna-docusate, sodium phosphate   Vital Signs    Vitals:   10/11/16 0758 10/11/16 0934 10/11/16 1121 10/11/16 1400  BP: 111/60 103/61 (!) 105/56 (!) 110/57  Pulse: 90 98 92 92  Resp:  19 13 15   Temp: 98 F (36.7 C)  98 F (36.7 C) 97.9 F (36.6 C)  TempSrc: Axillary  Oral Oral  SpO2:   98%   Weight:      Height:        Intake/Output Summary (Last 24 hours) at 10/11/16 1720 Last data filed at 10/11/16 1700  Gross per 24 hour  Intake            750.5 ml  Output              800 ml  Net            -49.5 ml   Filed Weights   10/09/16 0400 10/10/16 0642 10/11/16 0507  Weight: 149 lb 6.4 oz (67.8 kg) 153 lb 3.5 oz (69.5 kg) 152 lb 1.9 oz (69 kg)    Telemetry    Sinus rhythm - Personally  Reviewed  Physical Exam   GEN- The patient is ill appearing, alert but not very talkativ e Head- normocephalic, atraumatic Eyes-  Sclera clear, conjunctiva pink Ears- hearing intact Oropharynx- clear Neck- supple, Lungs- Clear to ausculation bilaterally, normal work of breathing Heart- Regular rate and rhythm  GI- soft, NT, ND, + BS Extremities- no clubbing, cyanosis, + dependant edema MS- diffuse atrophy Skin- ecchymosis over her chest Psych- flat affect Neuro- strength and sensation are intact   Labs    Chemistry Recent Labs Lab 10/09/16 0318 10/10/16 0343 10/11/16 0629  NA 140 139 137  K 4.3 4.0 4.4  CL 107 108 105  CO2 25 25 25   GLUCOSE 115* 126* 124*  BUN 21* 19 18  CREATININE 0.79 0.76 0.70  CALCIUM 7.1* 7.2* 7.1*  PROT 4.1* 4.2* 4.4*  ALBUMIN 1.1* 1.1* 1.1*  AST 28 21 22   ALT 15 15 16   ALKPHOS 308* 309* 318*  BILITOT 0.7 0.5 0.5  GFRNONAA >60 >60 >60  GFRAA >60 >60 >60  ANIONGAP 8 6 7  Hematology Recent Labs Lab 10/09/16 0559 10/10/16 0343 10/11/16 0629  WBC 5.3 6.3 6.4  RBC 3.10* 3.05* 3.15*  HGB 9.5* 9.4* 9.8*  HCT 30.5* 30.0* 31.1*  MCV 98.4 98.4 98.7  MCH 30.6 30.8 31.1  MCHC 31.1 31.3 31.5  RDW 19.7* 19.6* 19.1*  PLT 62* 55* 68*    Cardiac EnzymesNo results for input(s): TROPONINI in the last 168 hours. No results for input(s): TROPIPOC in the last 168 hours.     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 Heart failure  symptoms resolved promptly with diuretic therapy.  QT is prolonged in the setting of antifungal management.  Assessment & Plan    1. Prolonged QT Likely due to fluconazole.  Will defer to ID as to whether they have any other effective options. Would avoid other qt prolonging drugs.  I have stopped imodium which can cause qt prolongation. Continue metoprolol Keep K >3.9 and Mg> 1.9  2. Acute systolic dysfunction Improved Too ill for stress testing at this time A conservative approach is advised No change required today  3. Aortic dissection Stable No change required today  4. Anemia/ thrombocytopenia Stable No change required today  5. Fungemia Stable Will defer to ID as to whether they have any other effective options which do not prolong qt  Thompson Grayer MD, [facility] 10/11/2016 5:20 PM

## 2016-10-11 NOTE — Progress Notes (Signed)
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 The blood culture grwing is to therefore started on fluconazole for candidemia. Evaluated by ID. ID recommended continuing fluconazole 400 mg daily through 9/26. Patient now with congestive heart failure, hypoxia and echo showed reduced EF of 30-35% with akinesis of mid-apicalanteroseptal and anterior myocardium.   Assessment & Plan:   Principal Problem:   Candidemia ([place]) Active Problems:   GERD (gastroesophageal reflux disease)   Dissection of thoracoabdominal aorta (HCC)   AAA (abdominal aortic aneurysm) (HCC)   Benign essential HTN   Diabetes mellitus type 2 in obese (HCC)   Anxiety state   Hyponatremia   Leukocytosis   Sepsis (HCC)   Hypotension   AKI (acute kidney injury) ([place])   Dehydration   Generalized weakness   Anemia   Thrombocytopenia (HCC)   Abnormal computed tomography angiography (CTA) of abdomen and pelvis   Colitis: Probable   Protein-calorie malnutrition, severe   Palliative care by specialist   Adult failure to thrive   Hypophosphatemia   Hypomagnesemia   Depression   Acute systolic CHF (congestive heart failure) ([place])   DNR (do not resuscitate) discussion   Congestive heart failure ([place])   Heme positive stool   Cardiac LV ejection fraction 30-35%   QT prolongation   # Sepsis due to candidemia: -Continue fluconazole until September 26 and needs follow-up with ophthalmologist in [place] month as per  infectious disease. [ ]  discussed with Dr. Valetta Close 9/7 who recommended follow up after discharge [ ]  some concern for thrush, she does have white lesions over her tongue, is on fluconazole, will continue to monitor for now as her fluconazole is covering the candidemia, may consider adding something topical if persistent.     #Acute systolic congestive heart failure:  - resume lasix 20 mg PO daily, I/O's daily weights [ ]  repeat echo with EF 35-40% and grade 2 diastolic dysfunction as well as hypokinesis of mid-apicalanteroseptal, anterior, and inferolateral myocardium (mildly improved from 8/28 echo at 30-35%) [ ]  cardiology rec f/u in clinic in 4-6 weeks with repeat echo - may need Lexiscan Myoview versus cath. Denied shortness of breath or chest pain today. Continue to monitor urine output and electrolytes. Cardiology consult appreciated. - on beta blocker, consider ace/arb in future  #Prolonged QTC: Increased further to 540's today, will d/c tramadol and hold cymbalta.  Will touch base with cards again.  Zofran discontinued.  Discontinue Azithromycin and abilify.  Still on cymbalta. Monitor in telemetry. Repeat EKG. [ ]  Magnesium  #Severe protein calorie malnutrition: Patient is on NG tube because of decreased oral intake and severe malnutrition. Evaluated by  swallow team. Currently on tube feeding, encourage oral intake. Palliative care is following.  Will need to discuss plans for long term nutrition.  Nutrition recommending long term feeding access.  This morning [name] mentioned she wasn't sure about this.  I think it may be beneficial to have Haley Graham meeting to discuss long term goals and potential feeding tube with palliative.  Will touch base with them today.   #Acute kidney injury - Improved  #Acute on Chronic  anemia likely due to chronic disease vs acute GI bleed: Iron stores acceptable. Fecal occult blood test positive. On Protonix. Status post 2 unit of red blood cell transfusion on  8/30 with improvement in hemoglobin. Evaluated by GI. No further endoscopy at this time. Hemoglobin level stable. -Monitor CBC.  #Nausea and abdominal discomfort: Abdominal x-ray consistent with adynamic ileus. Evaluated by GI. Patient is tolerating tube feeds. Continue Zofran and symptomatic treatment. Unable to take reglan because of risk of QT prolongation. Clinically stable and denies abdominal pain today.  Try mylanta, which mike said helped at home.   #Thrombocytopenia likely due to acute illness: Monitor CBC. Platelet count up and down over past few days. Monitor labs  #Anxiety depression: Evaluated by psychiatrist.  Seems like mood is more down, refusing interventions over past day. -Continue Xanax, holding cymbalta, andVyvanse per psychiatrist  # hypokalemia/hypomagnesemia: Repleted magnesium and potassium chloride. Monitor labs.  # Loose stools: follow up c diff.   # Delirium:  Delirium prec, ctm  DVT prophylaxis: SCDs in place Code Status: full code Family Communication: will call husband Haley Graham  Disposition Plan: SNF    Consultants:   Cardiology, palliative, nutrition, GI, ID, psychiatry   Procedures: (Don't include imaging studies which can be auto populated. Include things that cannot be auto populated i.e. Echo, Carotid and venous dopplers, Foley, Bipap, HD, tubes/drains, wound vac, central lines etc)  Thoracentesis 8/17  Echo 8/28 EF 30-35%, akinesis of mid-apicalanteroseptal and anterior myocardium. Diastolic dysfunction.  Echo 8/13 EF 76-16, diastolic dysfunction, lipomatous hypertrophy  Echo 9/4 EF 35-40%, hypokinesis of mid apicalanteroseptal, anterior, and inferolateral myocardium, grade 2 diastolic dysfunction  Antimicrobials: (specify start and planned stop date. Auto populated tables are space occupying and do not give end dates)  fluconazole    Subjective: Having some nausea.  No vomiting.  Otherwise ok.  Stilll not sure about feeding tube.   Might not want this.   Objective: Vitals:   10/11/16 0000 10/11/16 0400 10/11/16 0507 10/11/16 0758  BP: 110/60 120/68  111/60  Pulse:    90  Resp: 15 13 18    Temp: 98.5 F (36.9 C) 98.3 F (36.8 C)  98 F (36.7 C)  TempSrc: Axillary Oral  Axillary  SpO2: 94% 96% 98%   Weight:   69 kg (152 lb 1.9 oz)   Height:        Intake/Output Summary (Last 24 hours) at 10/11/16 0823 Last data filed at 10/11/16 0510  Gross per 24 hour  Intake            853.5 ml  Output             1000 ml  Net           -146.5 ml   Filed Weights   10/09/16 0400 10/10/16 0642 10/11/16 0507  Weight: 67.8 kg (149 lb 6.4 oz) 69.5 kg (153 lb 3.5 oz) 69 kg (152 lb 1.9 oz)    Examination:  General: No acute distress. Cardiovascular: Heart sounds show Khloe Hunkele regular rate, and rhythm. No gallops or rubs. No murmurs. No JVD. Lungs: Clear to auscultation bilaterally with good air movement. No rales, rhonchi or wheezes. Abdomen: Soft, nontender, nondistended with normal active bowel sounds. No masses. No hepatosplenomegaly.  NG iin place Neurological: Alert and oriented.  Cranial nerves II through XII grossly intact. Skin: Warm and dry. No rashes or lesions.  Bruising. Extremities: No clubbing or cyanosis. Trace edema, ted hose.  Data Reviewed: I have personally reviewed following labs  and imaging studies  CBC:  Recent Labs Lab 10/07/16 0508 10/08/16 0230 10/09/16 0559 10/10/16 0343 10/11/16 0629  WBC 4.1 4.7 5.3 6.3 6.4  HGB 10.6* 9.9* 9.5* 9.4* 9.8*  HCT 33.3* 31.7* 30.5* 30.0* 31.1*  MCV 98.5 99.1 98.4 98.4 98.7  PLT 68* 70* 62* 55* 68*   Basic Metabolic Panel:  Recent Labs Lab 10/05/16 0251 10/06/16 0939 10/07/16 0508 10/08/16 0230 10/09/16 0318 10/10/16 0343 10/10/16 1405 10/11/16 0629  NA 137 137 137 137 140 139  --  137  K 3.6 3.3* 4.6 4.5 4.3 4.0  --  4.4  CL 103 101 103 104 107 108  --  105  CO2 27 29 27 27 25 25   --  25  GLUCOSE 93 115* 96 97 115* 126*  --  124*  BUN 8 13 15 17   21* 19  --  18  CREATININE 0.76 0.87 1.31* 0.81 0.79 0.76  --  0.70  CALCIUM 7.0* 6.9* 7.1* 7.1* 7.1* 7.2*  --  7.1*  MG 1.2* 1.5* 1.8  --   --   --  1.4* 2.2   GFR: Estimated Creatinine Clearance: 69.7 mL/min (by C-G formula based on SCr of 0.7 mg/dL). Liver Function Tests:  Recent Labs Lab 10/08/16 0230 10/09/16 0318 10/10/16 0343 10/11/16 0629  AST 24 28 21 22   ALT 16 15 15 16   ALKPHOS 284* 308* 309* 318*  BILITOT 0.8 0.7 0.5 0.5  PROT 4.4* 4.1* 4.2* 4.4*  ALBUMIN 1.2* 1.1* 1.1* 1.1*   No results for input(s): LIPASE, AMYLASE in the last 168 hours. No results for input(s): AMMONIA in the last 168 hours. Coagulation Profile: No results for input(s): INR, PROTIME in the last 168 hours. Cardiac Enzymes: No results for input(s): CKTOTAL, CKMB, CKMBINDEX, TROPONINI in the last 168 hours. BNP (last 3 results) No results for input(s): PROBNP in the last 8760 hours. HbA1C: No results for input(s): HGBA1C in the last 72 hours. CBG:  Recent Labs Lab 10/09/16 2051 10/10/16 0604 10/10/16 1125 10/10/16 2031 10/11/16 0639  GLUCAP 96 112* 110* 115* 118*   Lipid Profile: No results for input(s): CHOL, HDL, LDLCALC, TRIG, CHOLHDL, LDLDIRECT in the last 72 hours. Thyroid Function Tests: No results for input(s): TSH, T4TOTAL, FREET4, T3FREE, THYROIDAB in the last 72 hours. Anemia Panel: No results for input(s): VITAMINB12, FOLATE, FERRITIN, TIBC, IRON, RETICCTPCT in the last 72 hours. Sepsis Labs: No results for input(s): PROCALCITON, LATICACIDVEN in the last 168 hours.  Recent Results (from the past 240 hour(s))  C difficile quick screen w PCR reflex     Status: None   Collection Time: 10/07/16 11:58 AM  Result Value Ref Range Status   C Diff antigen NEGATIVE NEGATIVE Final   C Diff toxin NEGATIVE NEGATIVE Final   C Diff interpretation No C. difficile detected.  Final         Radiology Studies: No results found.      Scheduled Meds: . ALPRAZolam  0.25 mg Oral  QHS  . aspirin EC  81 mg Oral Daily  . dronabinol  5 mg Oral BID AC  . feeding supplement (ENSURE ENLIVE)  237 mL Oral TID BM  . feeding supplement (PRO-STAT SUGAR FREE 64)  30 mL Per Tube TID  . feeding supplement (VITAL 1.5 CAL)  1,000 mL Per Tube Q24H  . fluconazole  400 mg Oral Daily  . furosemide  20 mg Oral Daily  . insulin aspart  0-5 Units Subcutaneous QHS  . insulin aspart  0-9 Units Subcutaneous TID WC  . lisdexamfetamine  20 mg Oral Daily  . metoprolol tartrate  25 mg Oral BID  . pantoprazole  40 mg Oral QHS  . potassium chloride  40 mEq Per Tube BID  . scopolamine  1 patch Transdermal Q72H  . sodium chloride flush  3 mL Intravenous Q12H   Continuous Infusions:   LOS: 32 days    Time spent: 35 min    Pawan Knechtel Melven Sartorius, MD [facility] [phone]   If 7PM-7AM, please contact night-coverage [email or web address] Password [email or web address] 10/11/2016, 8:23 AM

## 2016-10-12 ENCOUNTER — Inpatient Hospital Stay (HOSPITAL_COMMUNITY): Payer: BLUE CROSS/BLUE SHIELD

## 2016-10-12 LAB — CBC
HCT: 30.8 % — ABNORMAL LOW (ref 36.0–46.0)
Hemoglobin: 10 g/dL — ABNORMAL LOW (ref 12.0–15.0)
MCH: 31 pg (ref 26.0–34.0)
MCHC: 32.5 g/dL (ref 30.0–36.0)
MCV: 95.4 fL (ref 78.0–100.0)
PLATELETS: 109 10*3/uL — AB (ref 150–400)
RBC: 3.23 MIL/uL — AB (ref 3.87–5.11)
RDW: 18.5 % — AB (ref 11.5–15.5)
WBC: 12.5 10*3/uL — AB (ref 4.0–10.5)

## 2016-10-12 LAB — GLUCOSE, CAPILLARY
GLUCOSE-CAPILLARY: 135 mg/dL — AB (ref 65–99)
GLUCOSE-CAPILLARY: 111 mg/dL — AB (ref 65–99)
Glucose-Capillary: 117 mg/dL — ABNORMAL HIGH (ref 65–99)
Glucose-Capillary: 108 mg/dL — ABNORMAL HIGH (ref 65–99)

## 2016-10-12 LAB — BASIC METABOLIC PANEL
ANION GAP: 9 (ref 5–15)
BUN: 17 mg/dL (ref 6–20)
CALCIUM: 7.5 mg/dL — AB (ref 8.9–10.3)
CO2: 22 mmol/L (ref 22–32)
CREATININE: 0.76 mg/dL (ref 0.44–1.00)
Chloride: 104 mmol/L (ref 101–111)
GFR calc Af Amer: >60 mL/min (ref >60)
Glucose, Bld: 122 mg/dL — ABNORMAL HIGH (ref 65–99)
Potassium: 5 mmol/L (ref 3.5–5.1)
SODIUM: 135 mmol/L (ref 135–145)

## 2016-10-12 LAB — MAGNESIUM: Magnesium: 1.7 mg/dL (ref 1.7–2.4)

## 2016-10-12 MED ORDER — PANTOPRAZOLE SODIUM 40 MG IV SOLR
40.0000 mg | Freq: Once | INTRAVENOUS | Status: AC
  Administered 2016-10-12: 40 mg via INTRAVENOUS
  Filled 2016-10-12: qty 40

## 2016-10-12 MED ORDER — SIMETHICONE 80 MG PO CHEW
80.0000 mg | CHEWABLE_TABLET | Freq: Once | ORAL | Status: AC
  Administered 2016-10-12: 80 mg via ORAL
  Filled 2016-10-12: qty 1

## 2016-10-12 MED ORDER — SODIUM CHLORIDE 0.9 % IV SOLN
INTRAVENOUS | Status: AC
  Administered 2016-10-12 – 2016-10-13 (×2): via INTRAVENOUS

## 2016-10-12 MED ORDER — DIPHENHYDRAMINE HCL 25 MG PO CAPS: 25.0000 mg | ORAL_CAPSULE | Freq: Once | ORAL | Status: DC | PRN

## 2016-10-12 MED ORDER — SODIUM CHLORIDE 0.9 % IV SOLN
6.2500 mg | Freq: Once | INTRAVENOUS | Status: AC
  Administered 2016-10-12: 6.25 mg via INTRAVENOUS
  Filled 2016-10-12: qty 0.25

## 2016-10-12 MED ORDER — METOPROLOL TARTRATE 5 MG/5ML IV SOLN
2.5000 mg | Freq: Once | INTRAVENOUS | Status: AC
  Administered 2016-10-12: 2.5 mg via INTRAVENOUS
  Filled 2016-10-12: qty 5

## 2016-10-12 MED ORDER — SODIUM CHLORIDE 0.9 % IV SOLN: INTRAVENOUS | Status: DC

## 2016-10-12 MED ORDER — DIPHENHYDRAMINE HCL 12.5 MG/5ML PO ELIX
25.0000 mg | ORAL_SOLUTION | Freq: Once | ORAL | Status: AC | PRN
  Administered 2016-10-15: 25 mg via ORAL
  Filled 2016-10-12 (×2): qty 10

## 2016-10-12 NOTE — Progress Notes (Signed)
Occupational Therapy Treatment Patient Details Name: Haley Graham MRN: [id number] DOB: October 27, 1955 Today's Date: 10/12/2016    History of present illness This 61 y.o. female admitted with sepsis, hypotension, dehydration, amemia, COPD, AKI due to poor oral intake and hypotension/dehydration.  PMH  includes:  Recent prolonged hospitalization for [place] type B aortic dissection s/p descending thoracic aortic stent graft placement; anxiety, GERD, COPD,    OT comments  Pt limited this session by HR elevated to 145 when sitting at EOB returned to 135 when repositioned into supine position. Notified RN who is monitoring. She demonstrated increased confusion this session asking for multiple family members who were not present. Pt was able to complete UB dressing tasks with max assist and grooming tasks to wash face with min guard assist seated at EOB. D/C recommendation remains appropriate. OT will continue to follow while admitted.    Follow Up Recommendations  SNF;Supervision/Assistance - 24 hour    Equipment Recommendations  None recommended by OT    Recommendations for Other Services      Precautions / Restrictions Precautions Precautions: Fall Precaution Comments: cortrak Restrictions Weight Bearing Restrictions: No       Mobility Bed Mobility Overal bed mobility: Needs Assistance Bed Mobility: Supine to Sit;Sit to Supine Rolling: Min assist Sidelying to sit: Mod assist;HOB elevated Supine to sit: Mod assist;HOB elevated Sit to supine: +2 for physical assistance;Mod assist Sit to sidelying: Mod assist;+2 for physical assistance General bed mobility comments: multimodal cues for sequencing and assist at trunk and bilat LE; use of rail  Transfers                 General transfer comment: deferred due to elevated HR up to 145 sitting EOB    Balance Overall balance assessment: Needs assistance Sitting-balance support: Feet supported;Bilateral upper extremity  supported Sitting balance-Leahy Scale: Fair Sitting balance - Comments: Min guard assist EOB to maintain sitting balance with bil UE supported.                                    ADL either performed or assessed with clinical judgement   ADL Overall ADL's : Needs assistance/impaired     Grooming: Min guard;Sitting Grooming Details (indicate cue type and reason): with encouragement able to wipe face with washcloth         Upper Body Dressing : Maximal assistance;Sitting                     General ADL Comments: Pt able to sit at EOB for participation but limited by increased HR to 145 with sitting activity. Pt able to complete UB dressing tasks to change gown with max assist.      Vision Patient Visual Report: No change from baseline     Perception     Praxis      Cognition Arousal/Alertness: Awake/alert Behavior During Therapy: Flat affect;Anxious Overall Cognitive Status: Impaired/Different from baseline Area of Impairment: Following commands;Problem solving;Orientation;Attention;Safety/judgement;Awareness                 Orientation Level: Place;Situation Current Attention Level: Sustained   Following Commands: Follows one step commands consistently;Follows one step commands with increased time Safety/Judgement: Decreased awareness of safety;Decreased awareness of deficits Awareness: Intellectual Problem Solving: Slow processing;Requires verbal cues;Difficulty sequencing General Comments: pt was confused during session and asking for several family members throughout; pt reported her husband needs to take her to  the hospital or home        Exercises     Shoulder Instructions       General Comments pt nauseated with emesis during session; HR in upper 120s upon arrival and up to 145 with sitting EOB    Pertinent Vitals/ Pain       Pain Assessment: Faces Faces Pain Scale: Hurts whole lot Pain Location: generalized Pain  Descriptors / Indicators: Grimacing;Guarding;Moaning;Aching Pain Intervention(s): Monitored during session;Repositioned  Home Living                                          Prior Functioning/Environment              Frequency  Min 2X/week        Progress Toward Goals  OT Goals(current goals can now be found in the care plan section)  Progress towards OT goals: Progressing toward goals  Acute Rehab OT Goals Patient Stated Goal: none stated today OT Goal Formulation: With patient Time For Goal Achievement: 10/13/16 Potential to Achieve Goals: [place] Discharge plan remains appropriate    Co-evaluation    PT/OT/SLP Co-Evaluation/Treatment: Yes Reason for Co-Treatment: Necessary to address cognition/behavior during functional activity;For patient/therapist safety PT goals addressed during session: Mobility/safety with mobility OT goals addressed during session: ADL's and self-care      AM-PAC PT "6 Clicks" Daily Activity     Outcome Measure   Help from another person eating meals?: Total (NPO) Help from another person taking care of personal grooming?: A Little Help from another person toileting, which includes using toliet, bedpan, or urinal?: A Lot Help from another person bathing (including washing, rinsing, drying)?: A Lot Help from another person to put on and taking off regular upper body clothing?: A Lot Help from another person to put on and taking off regular lower body clothing?: Total 6 Click Score: 11    End of Session    OT Visit Diagnosis: Unsteadiness on feet (R26.81);Muscle weakness (generalized) (M62.81) Pain - part of body:  (generalized and back)   Activity Tolerance Patient limited by fatigue;Treatment limited secondary to medical complications (Comment);Other (comment) (Limited by HR elevated up to 145)   Patient Left in bed;with call bell/phone within reach;with bed alarm set   Nurse Communication Mobility  status;Other (comment) (HR)        Time: 0931-1000 OT Time Calculation (min): 29 min  Charges: OT General Charges $OT Visit: 1 Visit OT Treatments $Self Care/Home Management : 8-22 mins  Norman Herrlich, MS OTR/L  Pager: Haley Graham 10/12/2016, 1:16 PM

## 2016-10-12 NOTE — Significant Event (Signed)
Called by nursing with results of KUB with findings suggestive of SBO.  Discussed with patient and her husband.  Plan for removal of cortrak, place NG tube, NPO, CT abdomen/pelvis.  Discussed with Dr. Kieth Brightly from surgery.

## 2016-10-12 NOTE — Consult Note (Signed)
Reason for Consult:vomiting Referring Physician: Alexzandria, Haley Graham is an 61 y.o. female.  HPI: 61 yo female with long hospitalization due to candidemia. She has had 24h of abdominal pain and nausea and vomiting. She also had this once before during the hospitalization, but per her husband, this is worse. She ahd been tolerating tube feeds and had 3 bowel movements yesterday, but none today. She had multiple episodes of vomiting beginning this morning. She has gastroparesis at baseline that causes nausea but no vomiting. She had a previous hysterectomy and cholecystectomy.   Past Medical History:  Diagnosis Date  . Anxiety   . Arthritis   . Diabetes mellitus without complication ([place])   . Gastroparesis   . GERD (gastroesophageal reflux disease)   . Hypertension   . IBS (irritable bowel syndrome)     Past Surgical History:  Procedure Laterality Date  . ABDOMINAL HYSTERECTOMY    . AORTOGRAM N/A 07/24/2016   Procedure: [facility], [place] AND ABDOMINAL AORTA  ANGIOGRAM;  Surgeon: Waynetta Sandy, MD;  Location: [facility];  Service: Vascular;  Laterality: N/A;  . CESAREAN SECTION    . CHOLECYSTECTOMY    . COLONOSCOPY    . COLONOSCOPY N/A 06/10/2016   Procedure: COLONOSCOPY;  Surgeon: Rogene Houston, MD;  [facility]: [facility];  Service: Endoscopy;  Laterality: N/A;  8:30  . IR THORACENTESIS ASP PLEURAL SPACE W/IMG GUIDE  09/18/2016  . POLYPECTOMY  06/10/2016   Procedure: POLYPECTOMY;  Surgeon: Rogene Houston, MD;  [facility]: [facility];  Service: Endoscopy;;  multiple colon  . THORACIC AORTIC ENDOVASCULAR STENT GRAFT N/A 08/06/2016   Procedure: THORACIC AORTIC ENDOVASCULAR STENT GRAFT/Thorasic and Abdominal Angiogram, Entravascular ultrasound., Open femoral exposure,Left Brachial access, and patch angioplasty right femoral artery.;  Surgeon: Serafina Mitchell, MD;  Location: [facility] OR;  Service: Vascular;  Laterality: N/A;  . UPPER GASTROINTESTINAL ENDOSCOPY      Family History  Problem  Relation Age of Onset  . Cancer Mother   . Stroke Mother   . Heart attack Mother   . Stroke Father     Social History:  reports that she has been smoking Cigarettes.  She has a 17.25 pack-year smoking history. She has never used smokeless tobacco. She reports that she does not drink alcohol or use drugs.  Allergies:  Allergies  Allergen Reactions  . Penicillins Rash and Other (See Comments)    PATIENT HAS HAD A PCN REACTION WITH IMMEDIATE RASH, FACIAL/TONGUE/THROAT SWELLING, SOB, OR LIGHTHEADEDNESS WITH HYPOTENSION:  #  #  #  YES  #  #  #   Has patient had a PCN reaction causing severe rash involving mucus membranes or skin necrosis: no Has patient had a PCN reaction that required hospitalization no Has patient had a PCN reaction occurring within the last 10 years:no If all of the above answers are "NO", then may proceed with Cephalosporin use.  . Ativan [Lorazepam] Other (See Comments)    Makes pt very confused and more agitated, irritable.  . Codeine Nausea And Vomiting  . Reglan [Metoclopramide] Other (See Comments)    TACHYCARDIA    Medications: I have reviewed the patient's current medications.  Results for orders placed or performed during the hospital encounter of 09/16/2016 (from the past 48 hour(s))  CBC     Status: Abnormal   Collection Time: 10/11/16  6:29 AM  Result Value Ref Range   WBC 6.4 4.0 - 10.5 K/uL   RBC 3.15 (L) 3.87 - 5.11 MIL/uL  Hemoglobin 9.8 (L) 12.0 - 15.0 g/dL   HCT 31.1 (L) 36.0 - 46.0 %   MCV 98.7 78.0 - 100.0 fL   MCH 31.1 26.0 - 34.0 pg   MCHC 31.5 30.0 - 36.0 g/dL   RDW 19.1 (H) 11.5 - 15.5 %   Platelets 68 (L) 150 - 400 K/uL    Comment: CONSISTENT WITH PREVIOUS RESULT  Comprehensive metabolic panel     Status: Abnormal   Collection Time: 10/11/16  6:29 AM  Result Value Ref Range   Sodium 137 135 - 145 mmol/L   Potassium 4.4 3.5 - 5.1 mmol/L   Chloride 105 101 - 111 mmol/L   CO2 25 22 - 32 mmol/L   Glucose, Bld 124 (H) 65 - 99 mg/dL    BUN 18 6 - 20 mg/dL   Creatinine, Ser 0.70 0.44 - 1.00 mg/dL   Calcium 7.1 (L) 8.9 - 10.3 mg/dL   Total Protein 4.4 (L) 6.5 - 8.1 g/dL   Albumin 1.1 (L) 3.5 - 5.0 g/dL   AST 22 15 - 41 U/L   ALT 16 14 - 54 U/L   Alkaline Phosphatase 318 (H) 38 - 126 U/L   Total Bilirubin 0.5 0.3 - 1.2 mg/dL   GFR calc non Af Amer >60 >60 mL/min   GFR calc Af Amer >60 >60 mL/min    Comment: (NOTE) The eGFR has been calculated using the CKD EPI equation. This calculation has not been validated in all clinical situations. eGFR's persistently <60 mL/min signify possible Chronic Kidney Disease.    Anion gap 7 5 - 15  Magnesium     Status: None   Collection Time: 10/11/16  6:29 AM  Result Value Ref Range   Magnesium 2.2 1.7 - 2.4 mg/dL  Glucose, capillary     Status: Abnormal   Collection Time: 10/11/16  6:39 AM  Result Value Ref Range   Glucose-Capillary 118 (H) 65 - 99 mg/dL  Glucose, capillary     Status: Abnormal   Collection Time: 10/11/16 11:18 AM  Result Value Ref Range   Glucose-Capillary 120 (H) 65 - 99 mg/dL   Comment 1 Notify RN   Glucose, capillary     Status: Abnormal   Collection Time: 10/11/16  4:18 PM  Result Value Ref Range   Glucose-Capillary 124 (H) 65 - 99 mg/dL   Comment 1 Notify RN   Glucose, capillary     Status: Abnormal   Collection Time: 10/11/16  8:46 PM  Result Value Ref Range   Glucose-Capillary 109 (H) 65 - 99 mg/dL  Glucose, capillary     Status: Abnormal   Collection Time: 10/12/16  6:19 AM  Result Value Ref Range   Glucose-Capillary 117 (H) 65 - 99 mg/dL  Glucose, capillary     Status: Abnormal   Collection Time: 10/12/16 12:26 PM  Result Value Ref Range   Glucose-Capillary 135 (H) 65 - 99 mg/dL   Comment 1 Notify RN    Comment 2 Document in Chart   Basic metabolic panel     Status: Abnormal   Collection Time: 10/12/16  2:39 PM  Result Value Ref Range   Sodium 135 135 - 145 mmol/L   Potassium 5.0 3.5 - 5.1 mmol/L   Chloride 104 101 - 111 mmol/L   CO2  22 22 - 32 mmol/L   Glucose, Bld 122 (H) 65 - 99 mg/dL   BUN 17 6 - 20 mg/dL   Creatinine, Ser 0.76 0.44 - 1.00 mg/dL  Calcium 7.5 (L) 8.9 - 10.3 mg/dL   GFR calc non Af Amer >60 >60 mL/min   GFR calc Af Amer >60 >60 mL/min    Comment: (NOTE) The eGFR has been calculated using the CKD EPI equation. This calculation has not been validated in all clinical situations. eGFR's persistently <60 mL/min signify possible Chronic Kidney Disease.    Anion gap 9 5 - 15  Magnesium     Status: None   Collection Time: 10/12/16  2:39 PM  Result Value Ref Range   Magnesium 1.7 1.7 - 2.4 mg/dL  CBC     Status: Abnormal   Collection Time: 10/12/16  4:47 PM  Result Value Ref Range   WBC 12.5 (H) 4.0 - 10.5 K/uL    Comment: WHITE COUNT CONFIRMED ON SMEAR   RBC 3.23 (L) 3.87 - 5.11 MIL/uL   Hemoglobin 10.0 (L) 12.0 - 15.0 g/dL   HCT 30.8 (L) 36.0 - 46.0 %   MCV 95.4 78.0 - 100.0 fL   MCH 31.0 26.0 - 34.0 pg   MCHC 32.5 30.0 - 36.0 g/dL   RDW 18.5 (H) 11.5 - 15.5 %   Platelets 109 (L) 150 - 400 K/uL    Comment: REPEATED TO VERIFY SPECIMEN CHECKED FOR CLOTS PLATELET COUNT CONFIRMED BY SMEAR   Glucose, capillary     Status: Abnormal   Collection Time: 10/12/16  5:26 PM  Result Value Ref Range   Glucose-Capillary 108 (H) 65 - 99 mg/dL   Comment 1 Notify RN    Comment 2 Document in Chart   Glucose, capillary     Status: Abnormal   Collection Time: 10/12/16  8:28 PM  Result Value Ref Range   Glucose-Capillary 111 (H) 65 - 99 mg/dL   Comment 1 Notify RN    Comment 2 Document in Chart     Dg Abd 1 View  Result Date: 10/12/2016 CLINICAL DATA:  Vomiting and low mid abdominal pain since this morning. EXAM: ABDOMEN - 1 VIEW COMPARISON:  09/17/2016. FINDINGS: Feeding tube terminates in the descending duodenum. There is marked gaseous distension of small bowel. Minimal colonic gas with gas seen in the rectum. IMPRESSION: Bowel-gas pattern is indicative of a small bowel obstruction. Electronically  Signed   By: Lorin Picket M.D.   On: 10/12/2016 16:04    Review of Systems  Constitutional: Positive for chills, fever and malaise/fatigue.  HENT: Negative for ear discharge, ear pain, nosebleeds and sinus pain.   Respiratory: Positive for cough.   Cardiovascular: Negative for palpitations, orthopnea and claudication.  Gastrointestinal: Positive for abdominal pain, nausea and vomiting.  Genitourinary: Negative for dysuria, frequency, hematuria and urgency.  Musculoskeletal: Positive for back pain, joint pain and myalgias. Negative for neck pain.  Neurological: Positive for weakness.  Endo/Heme/Allergies: Bruises/bleeds easily.  All other systems reviewed and are negative.  Blood pressure 116/65, pulse (!) 115, temperature 98.7 F (37.1 C), temperature source Other (Comment), resp. rate (!) 21, height 5' 3" (1.6 m), weight 69.9 kg (154 lb), SpO2 97 %. Physical Exam  Vitals reviewed. Constitutional: She is oriented to person, place, and time. She appears well-developed and well-nourished.  HENT:  Head: Normocephalic and atraumatic.  Eyes: Pupils are equal, round, and reactive to light. Conjunctivae and EOM are normal.  Neck: Normal range of motion. Neck supple.  Cardiovascular:  Tachycardic, S1S2  Respiratory: Effort normal and breath sounds normal.  GI: Soft. Bowel sounds are normal. She exhibits no distension. There is no tenderness.  Musculoskeletal:  Weakness  throughout, hands appear dusky and dry  Neurological: She is alert and oriented to person, place, and time.  Skin: Skin is warm and dry.  Ecchymosis throughout skin  Psychiatric: She has a normal mood and affect. Her behavior is normal.    Assessment/Plan: 61 yo female with nausea, vomiting and abdominal pain. NG has put out >554m this evening. XR from today shows some dilation of small intestine, slightly more distension than XR from 8/31 that was labeled as likely ileus. No guarding or peritoneal signs. -CT scan  with contrast -NG tube afterward -will follow -likely SBO protocol candidate  Haley Graham 10/12/2016, 9:34 PM

## 2016-10-12 NOTE — Progress Notes (Signed)
Dr. Florene Glen made aware through [facility] system about patient NV, orders received will monitor patient. Lazariah Savard, Bettina Gavia rN

## 2016-10-12 NOTE — Progress Notes (Signed)
Progress Note  Patient Name: Haley Graham Date of Encounter: 10/12/2016  Primary Cardiologist: Dr. Sallyanne Kuster  Subjective   Pt is ill-appearing this morning and has had at least 6 episodes of vomiting. TF held via [place].   Inpatient Medications    Scheduled Meds: . ALPRAZolam  0.25 mg Oral QHS  . aspirin EC  81 mg Oral Daily  . dronabinol  5 mg Oral BID AC  . feeding supplement (ENSURE ENLIVE)  237 mL Oral TID BM  . feeding supplement (PRO-STAT SUGAR FREE 64)  30 mL Per Tube TID  . feeding supplement (VITAL 1.5 CAL)  1,000 mL Per Tube Q24H  . fluconazole  400 mg Oral Daily  . furosemide  20 mg Oral Daily  . insulin aspart  0-5 Units Subcutaneous QHS  . insulin aspart  0-9 Units Subcutaneous TID WC  . lisdexamfetamine  20 mg Oral Daily  . metoprolol tartrate  25 mg Oral BID  . pantoprazole  40 mg Oral QHS  . potassium chloride  40 mEq Per Tube BID  . scopolamine  1 patch Transdermal Q72H  . sodium chloride flush  3 mL Intravenous Q12H   Continuous Infusions:  PRN Meds: acetaminophen **OR** acetaminophen, alum & mag hydroxide-simeth, diphenhydrAMINE, fluticasone, ipratropium-albuterol, lidocaine, menthol-cetylpyridinium, senna-docusate, sodium phosphate   Vital Signs    Vitals:   10/12/16 0400 10/12/16 1050 10/12/16 1134 10/12/16 1200  BP: 127/68 116/60 118/70 132/78  Pulse:  (!) 128 (!) 129   Resp: 20   18  Temp: 98 F (36.7 C)     TempSrc: Oral     SpO2: 97%   97%  Weight: 154 lb (69.9 kg)     Height:        Intake/Output Summary (Last 24 hours) at 10/12/16 1213 Last data filed at 10/12/16 0700  Gross per 24 hour  Intake              570 ml  Output              200 ml  Net              370 ml   Filed Weights   10/10/16 0642 10/11/16 0507 10/12/16 0400  Weight: 153 lb 3.5 oz (69.5 kg) 152 lb 1.9 oz (69 kg) 154 lb (69.9 kg)     Physical Exam   General: Well developed, well nourished, female appearing in no acute distress. Head: Normocephalic,  atraumatic.  Neck: Supple without bruits, no JVD Lungs:  Resp unlabored, clear throughout Heart: Regular rhythm, tachycardic rate, S1, S2, no S3, S4,  murmur; no rub. Abdomen: Soft, non-tender, non-distended with normoactive bowel sounds. No hepatomegaly. No rebound/guarding. No obvious abdominal masses. Extremities: No clubbing, cyanosis, no B LE edema. Distal pedal pulses are 1+ bilaterally. Neuro: Alert and oriented X 3. Moves all extremities spontaneously. Psych: Normal affect.  Labs    Chemistry Recent Labs Lab 10/09/16 0318 10/10/16 0343 10/11/16 0629  NA 140 139 137  K 4.3 4.0 4.4  CL 107 108 105  CO2 _0 GLUCOSE 115* 126* 124*  BUN 21* 19 18  CREATININE 0.79 0.76 0.70  CALCIUM 7.1* 7.2* 7.1*  PROT 4.1* 4.2* 4.4*  ALBUMIN 1.1* 1.1* 1.1*  AST _1 ALT _2 ALKPHOS 308* 309* 318*  BILITOT 0.7 0.5 0.5  GFRNONAA >60 >60 >60  GFRAA >60 >60 >60  ANIONGAP _3 Hematology Recent Labs  Lab 10/09/16 0559 10/10/16 0343 10/11/16 0629  WBC 5.3 6.3 6.4  RBC 3.10* 3.05* 3.15*  HGB 9.5* 9.4* 9.8*  HCT 30.5* 30.0* 31.1*  MCV 98.4 98.4 98.7  MCH 30.6 30.8 31.1  MCHC 31.1 31.3 31.5  RDW 19.7* 19.6* 19.1*  PLT 62* 55* 68*    Cardiac EnzymesNo results for input(s): TROPONINI in the last 168 hours. No results for input(s): TROPIPOC in the last 168 hours.   BNPNo results for input(s): BNP, PROBNP in the last 168 hours.   DDimer No results for input(s): DDIMER in the last 168 hours.   Radiology    No results found.   Telemetry    Sinus tachycardia in 120s - Personally Reviewed  ECG    Sinus tachycardia with QTc 471 ms - Personally Reviewed   Cardiac Studies   Echocardiogram 10/06/16: Study Conclusions - Left ventricle: The cavity size was normal. Systolic function was   moderately reduced. The estimated ejection fraction was in the   range of 35% to 40%. Hypokinesis of the mid-apicalanteroseptal,   anterior, and inferolateral  myocardium. Features are consistent   with a pseudonormal left ventricular filling pattern, with   concomitant abnormal relaxation and increased filling pressure   (grade 2 diastolic dysfunction). - Aortic valve: Transvalvular velocity was within the normal range.   There was no stenosis. There was no regurgitation. - Mitral valve: Transvalvular velocity was within the normal range.   There was no evidence for stenosis. There was trivial   regurgitation. - Left atrium: The atrium was mildly dilated. - Right ventricle: The cavity size was normal. Wall thickness was   normal. Systolic function was normal. - Tricuspid valve: There was no regurgitation.  Patient Profile     61 y.o. 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 Heart failure symptoms resolved promptly with diuretic therapy.  Assessment & Plan    1. 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 Acute systolic and diastolic heart failure, unknown etiology - pt continues to deny chest pain, will continue to defer ischemic evaluation  - pt is 154 lbs (from 166 lbs on admission), overall net negative 6L - not on diuretic at this time  3. Prolonged QT - EKG today with QTc 471 ms - avoid prolonging agents - fluconazole per ID - K 4.4 - Mg 2.2 on 9/9  4. Sinus tachycardia - likely secondary to dehydration given her vomiting with tube feeds held - consider gentle IVF hydration at 50 mg /hr  5. Nausea and vomiting -  per primary team, TF currenlty held   Signed, Ledora Bottcher , PA-C 12:13 PM 10/12/2016 Pager: [phone]  I have seen and examined the patient along with Fabian Sharp, PA.  I have reviewed the chart, notes and new data.  I agree with PA's note.  Key new complaints: vomiting, crying Key examination changes: no overt CHF by exam, weight inching up daily for last 4-5 days Key new findings / data: QTC 470 ms earlier today, but was 548 ms yesterday. Rechecking ECG now. K and  Mg OK, Ca low, but Albumin 1.1. Alk phos peaked at 750 on 08/27 and was trending down, but now steady at around 300, other LFTs normal.  PLAN: Recheck ECG to see if it is safe to give ondansetron. Avoid metoclopramide. Check ionized Ca and correct if low. She is severely malnourished. Recheck echo later this week. The persistently abnormal anterior ECG changes maintain concern for LAD artery ischemia. One would expect changes of takotsubo sd. To resolve in 2 weeks. Heart rate remains too fast for coronary CT angio and she is too ill for conventional cardiac cath. Today, I do not think she is healthy enough even for a Myoview.  Sanda Klein, MD, [place] [phone] 10/12/2016, 1:15 PM

## 2016-10-12 NOTE — Progress Notes (Signed)
Tube feedings turned off at this time per verbal order Dr. Skipper Cliche RN

## 2016-10-12 NOTE — Progress Notes (Signed)
Pt refused lab draws. Is getting agitated and keeps saying she wants to go home.  Refused the [place] offered to her this AM. Has kept saying she needed to be put on a bed pan for a bowel movement, but nothing has passed all night except for gas.  Will inform oncoming nurse.  Lupita Dawn, RN

## 2016-10-12 NOTE — Progress Notes (Signed)
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 The blood culture grwing is to therefore started on fluconazole for candidemia. Evaluated by ID. ID recommended continuing fluconazole 400 mg daily through 9/26. Patient now with congestive heart failure, hypoxia and echo showed reduced EF of 30-35% with akinesis of mid-apicalanteroseptal and anterior myocardium.   Assessment & Plan:   Principal Problem:   Candidemia ([place]) Active Problems:   GERD (gastroesophageal reflux disease)   Dissection of thoracoabdominal aorta (HCC)   AAA (abdominal aortic aneurysm) (HCC)   Benign essential HTN   Diabetes mellitus type 2 in obese (HCC)   Anxiety state   Hyponatremia   Leukocytosis   Sepsis (HCC)   Hypotension   AKI (acute kidney injury) ([place])   Dehydration   Generalized weakness   Anemia   Thrombocytopenia (HCC)   Abnormal computed tomography angiography (CTA) of abdomen and pelvis   Colitis: Probable   Protein-calorie malnutrition, severe   Palliative care by specialist   Adult failure to thrive   Hypophosphatemia   Hypomagnesemia   Depression   Acute systolic CHF (congestive heart failure) ([place])   DNR (do not resuscitate) discussion   Congestive heart failure ([place])   Heme positive stool   Cardiac LV ejection fraction 30-35%   QT prolongation   # Sepsis due to candidemia: -Continue fluconazole until September 26 and needs follow-up with ophthalmologist in a month as per  infectious disease. [ ]  discussed with Dr. Valetta Close 9/7 who recommended follow up after discharge [ ]  some concern for thrush, she does have white lesions over her tongue, is on fluconazole, will continue to monitor for now as her fluconazole is covering the candidemia, may consider adding something topical if persistent.     #Acute systolic congestive heart failure:  - resume lasix 20 mg PO daily, I/O's daily weights [ ]  repeat echo with EF 35-40% and grade 2 diastolic dysfunction as well as hypokinesis of mid-apicalanteroseptal, anterior, and inferolateral myocardium (mildly improved from 8/28 echo at 30-35%) [ ]  cardiology rec f/u in clinic in 4-6 weeks with repeat echo -> cards considering repeat echo this week.  - may need Lexiscan Myoview versus cath. Denied shortness of breath or chest pain today. Continue to monitor urine output and electrolytes. Cardiology consult appreciated. - on beta blocker, consider ace/arb in future  #Prolonged QTC: D/c tramadol and hold cymbalta.  .  Zofran discontinued.  Discontinue Azithromycin and abilify.  Still on cymbalta. Monitor in telemetry. Repeat EKG. [ ]  Magnesium  #Nausea and abdominal discomfort: N/v started last night also some abdominal discomfort.  Difficult to control with prolonged QT, will dose phenergan IV 6.25 x 1 with improved QT (doesn't tolerate ativan, notes zofran hasn't been working).   [ ]  KUB and labs  # Tachycardia: likely 2/2 nausea/vomiting, poor PO intake.  Sinus tach on EKG.  Will continue to monitor. Gentle IVF.  Antiemetics.   #Severe protein calorie malnutrition: Patient is on NG tube because of decreased oral intake and severe malnutrition. Evaluated by  swallow team. Currently on tube feeding, encourage oral intake. Palliative care is following.  Will  need to discuss plans for long term nutrition.  Nutrition recommending long term feeding access.  This morning Mrs. Sickles mentioned she wasn't sure about this.  I think it may  be beneficial to have a meeting to discuss long term goals and potential feeding tube with palliative (I discussed this yesterday with palliative), although with her continued nausea and vomiting and not tolerating the tube feeds, I'm hesitant to continue to recommend this if she's not tolerating the NG.  #Acute kidney injury - Improved  #Acute on Chronic anemia likely due to chronic disease vs acute GI bleed: Iron stores acceptable. Fecal occult blood test positive. On Protonix. Status post 2 unit of red blood cell transfusion on 8/30 with improvement in hemoglobin. Evaluated by GI. No further endoscopy at this time. Hemoglobin level stable. -Monitor CBC.  #Thrombocytopenia likely due to acute illness: Monitor CBC. Platelet count up and down over past few days. Monitor labs  #Anxiety depression: Evaluated by psychiatrist.   -Continue Xanax, holding cymbalta, andVyvanse per psychiatrist  # hypokalemia/hypomagnesemia: Repleted magnesium and potassium chloride. Monitor labs.  # Loose stools: follow up c diff.   # Delirium:  Delirium prec, ctm  DVT prophylaxis: SCDs in place Code Status: full code Family Communication: will call husband [name]  Disposition Plan: SNF    Consultants:   Cardiology, palliative, nutrition, GI, ID, psychiatry   Procedures: (Don't include imaging studies which can be auto populated. Include things that cannot be auto populated i.e. Echo, Carotid and venous dopplers, Foley, Bipap, HD, tubes/drains, wound vac, central lines etc)  Thoracentesis 8/17  Echo 8/28 EF 30-35%, akinesis of mid-apicalanteroseptal and anterior myocardium. Diastolic dysfunction.  Echo 8/13 EF 71-06, diastolic dysfunction, lipomatous hypertrophy  Echo 9/4 EF 35-40%, hypokinesis of mid apicalanteroseptal, anterior, and inferolateral myocardium, grade 2 diastolic dysfunction  Antimicrobials: (specify start and planned stop date. Auto populated tables are space occupying and do not  give end dates)  fluconazole    Subjective: N/V.  Emesis last night.  No SOB.  Some abdominal pain.  Wants something for pain.    Objective: Vitals:   10/12/16 1050 10/12/16 1134 10/12/16 1200 10/12/16 1222  BP: 116/60 118/70 132/78 132/78  Pulse: (!) 128 (!) 129  (!) 120  Resp:   18 17  Temp:    97.7 F (36.5 C)  TempSrc:    Oral  SpO2:   97% 99%  Weight:      Height:        Intake/Output Summary (Last 24 hours) at 10/12/16 1406 Last data filed at 10/12/16 0700  Gross per 24 hour  Intake              570 ml  Output              100 ml  Net              470 ml   Filed Weights   10/10/16 0642 10/11/16 0507 10/12/16 0400  Weight: 69.5 kg (153 lb 3.5 oz) 69 kg (152 lb 1.9 oz) 69.9 kg (154 lb)    Examination:  General: No acute distress.  Has 1 episode of vomiting NBNB in the room. Cardiovascular: Heart sounds show a regular rate, and rhythm. No gallops or rubs. No murmurs. No JVD. Lungs: Clear to auscultation bilaterally with good air movement. No rales, rhonchi or wheezes. Abdomen: Soft, mildly tender, nondistended.  Quiet bowel sounds. No masses. No hepatosplenomegaly. Neurological: Alert and oriented 3. Moves all extremities 4 with equal strength.  Cranial nerves II through XII grossly intact. Skin: Warm and dry. No rashes or lesions.  Bruising. Extremities: No clubbing or cyanosis. No edema.   Data Reviewed: I have personally reviewed following labs and imaging studies  CBC:  Recent Labs Lab 10/07/16 0508 10/08/16 0230 10/09/16 0559 10/10/16 0343 10/11/16 0629  WBC 4.1 4.7 5.3 6.3 6.4  HGB 10.6* 9.9* 9.5* 9.4* 9.8*  HCT 33.3* 31.7* 30.5* 30.0* 31.1*  MCV 98.5 99.1 98.4 98.4 98.7  PLT 68* 70* 62* 55* 68*   Basic Metabolic Panel:  Recent Labs Lab 10/06/16 0939 10/07/16 0508 10/08/16 0230 10/09/16 0318 10/10/16 0343 10/10/16 1405 10/11/16 0629  NA 137 137 137 140 139  --  137  K 3.3* 4.6 4.5 4.3 4.0  --  4.4  CL 101 103 104 107 108  --  105    CO2 29 27 27 25 25   --  25  GLUCOSE 115* 96 97 115* 126*  --  124*  BUN 13 15 17  21* 19  --  18  CREATININE 0.87 1.31* 0.81 0.79 0.76  --  0.70  CALCIUM 6.9* 7.1* 7.1* 7.1* 7.2*  --  7.1*  MG 1.5* 1.8  --   --   --  1.4* 2.2   GFR: Estimated Creatinine Clearance: 70.1 mL/min (by C-G formula based on SCr of 0.7 mg/dL). Liver Function Tests:  Recent Labs Lab 10/08/16 0230 10/09/16 0318 10/10/16 0343 10/11/16 0629  AST 24 28 21 22   ALT 16 15 15 16   ALKPHOS 284* 308* 309* 318*  BILITOT 0.8 0.7 0.5 0.5  PROT 4.4* 4.1* 4.2* 4.4*  ALBUMIN 1.2* 1.1* 1.1* 1.1*   No results for input(s): LIPASE, AMYLASE in the last 168 hours. No results for input(s): AMMONIA in the last 168 hours. Coagulation Profile: No results for input(s): INR, PROTIME in the last 168 hours. Cardiac Enzymes: No results for input(s): CKTOTAL, CKMB, CKMBINDEX, TROPONINI in the last 168 hours. BNP (last 3 results) No results for input(s): PROBNP in the last 8760 hours. HbA1C: No results for input(s): HGBA1C in the last 72 hours. CBG:  Recent Labs Lab 10/11/16 1118 10/11/16 1618 10/11/16 2046 10/12/16 0619 10/12/16 1226  GLUCAP 120* 124* 109* 117* 135*   Lipid Profile: No results for input(s): CHOL, HDL, LDLCALC, TRIG, CHOLHDL, LDLDIRECT in the last 72 hours. Thyroid Function Tests: No results for input(s): TSH, T4TOTAL, FREET4, T3FREE, THYROIDAB in the last 72 hours. Anemia Panel: No results for input(s): VITAMINB12, FOLATE, FERRITIN, TIBC, IRON, RETICCTPCT in the last 72 hours. Sepsis Labs: No results for input(s): PROCALCITON, LATICACIDVEN in the last 168 hours.  Recent Results (from the past 240 hour(s))  C difficile quick screen w PCR reflex     Status: None   Collection Time: 10/07/16 11:58 AM  Result Value Ref Range Status   C Diff antigen NEGATIVE NEGATIVE Final   C Diff toxin NEGATIVE NEGATIVE Final   C Diff interpretation No C. difficile detected.  Final         Radiology  Studies: No results found.      Scheduled Meds: . ALPRAZolam  0.25 mg Oral QHS  . aspirin EC  81 mg Oral Daily  . dronabinol  5 mg Oral BID AC  . feeding supplement (ENSURE ENLIVE)  237 mL Oral TID BM  . feeding supplement (PRO-STAT SUGAR FREE 64)  30 mL Per Tube TID  . feeding supplement (VITAL 1.5 CAL)  1,000 mL Per Tube Q24H  . fluconazole  400  mg Oral Daily  . furosemide  20 mg Oral Daily  . insulin aspart  0-5 Units Subcutaneous QHS  . insulin aspart  0-9 Units Subcutaneous TID WC  . lisdexamfetamine  20 mg Oral Daily  . metoprolol tartrate  25 mg Oral BID  . pantoprazole  40 mg Oral QHS  . potassium chloride  40 mEq Per Tube BID  . scopolamine  1 patch Transdermal Q72H  . sodium chloride flush  3 mL Intravenous Q12H   Continuous Infusions:   LOS: 33 days    Time spent: 35 min    Fayrene Helper, MD [facility] [phone]   If 7PM-7AM, please contact night-coverage [email or web address] Password [email or web address] 10/12/2016, 2:06 PM

## 2016-10-12 NOTE — Progress Notes (Signed)
Pt having abdominal pains with little relief, probably from gas in the intestines.  The on call hospitalist was informed and ordered a one time oral Mylicon 80 mg.  Medicine was given to pt.  Will continue to monitor.  Lupita Dawn, RN

## 2016-10-12 NOTE — Progress Notes (Signed)
Physical Therapy Treatment Patient Details Name: Haley Graham MRN: [id number] DOB: 1955/11/05 Today's Date: 10/12/2016    History of Present Illness This 61 y.o. female admitted with sepsis, hypotension, dehydration, amemia, COPD, AKI due to poor oral intake and hypotension/dehydration.  PMH  includes:  Recent prolonged hospitalization for [place] type B aortic dissection s/p descending thoracic aortic stent graft placement; anxiety, GERD, COPD,     PT Comments    Patient was confused and very focused on abdominal pain. Pt required mod A +2 for bed mobility and able to maintain sitting balance EOB with single UE support at times. Pt tachycardic with HR in upper 120s in supine and up to 145 sitting EOB. Continue to progress as tolerated.    Follow Up Recommendations  SNF     Equipment Recommendations  None recommended by PT    Recommendations for Other Services       Precautions / Restrictions Precautions Precautions: Fall Precaution Comments: cortrak Restrictions Weight Bearing Restrictions: No    Mobility  Bed Mobility Overal bed mobility: Needs Assistance Bed Mobility: Supine to Sit;Sit to Supine     Supine to sit: Mod assist;HOB elevated Sit to supine: +2 for physical assistance;Mod assist   General bed mobility comments: multimodal cues for sequencing and assist at trunk and bilat LE; use of rail  Transfers                 General transfer comment: deferred due to elevated HR up to 145 sitting EOB  Ambulation/Gait                 Stairs            Wheelchair Mobility    Modified Rankin (Stroke Patients Only)       Balance Overall balance assessment: Needs assistance Sitting-balance support: Feet supported;Bilateral upper extremity supported Sitting balance-Leahy Scale: Fair                                      Cognition Arousal/Alertness: Awake/alert Behavior During Therapy: Flat affect;Anxious Overall  Cognitive Status: Impaired/Different from baseline Area of Impairment: Following commands;Problem solving;Orientation;Attention;Safety/judgement;Awareness                 Orientation Level: Place;Situation Current Attention Level: Sustained   Following Commands: Follows one step commands consistently;Follows one step commands with increased time Safety/Judgement: Decreased awareness of safety;Decreased awareness of deficits Awareness: Intellectual Problem Solving: Slow processing;Requires verbal cues;Difficulty sequencing General Comments: pt was confused during session and asking for several family members throughout; pt reported her husband needs to take her to the hospital or home      Exercises      General Comments General comments (skin integrity, edema, etc.): pt nauseated with emesis during session; HR in upper 120s upon arrival and up to 145 with sitting EOB      Pertinent Vitals/Pain Pain Assessment: Faces Faces Pain Scale: Hurts whole lot Pain Location: generalized Pain Descriptors / Indicators: Grimacing;Guarding;Moaning;Aching Pain Intervention(s): Monitored during session;Repositioned    Home Living                      Prior Function            PT Goals (current goals can now be found in the care plan section) Acute Rehab PT Goals PT Goal Formulation: With family Time For Goal Achievement: 10/31/2016 Potential to Achieve Goals: Fair  Progress towards PT goals: Not progressing toward goals - comment    Frequency    Min 2X/week      PT Plan Current plan remains appropriate    Co-evaluation PT/OT/SLP Co-Evaluation/Treatment: Yes Reason for Co-Treatment: Necessary to address cognition/behavior during functional activity;For patient/therapist safety PT goals addressed during session: Mobility/safety with mobility        AM-PAC PT "6 Clicks" Daily Activity  Outcome Measure  Difficulty turning over in bed (including adjusting  bedclothes, sheets and blankets)?: Unable Difficulty moving from lying on back to sitting on the side of the bed? : Unable Difficulty sitting down on and standing up from a chair with arms (e.g., wheelchair, bedside commode, etc,.)?: Unable Help needed moving to and from a bed to chair (including a wheelchair)?: A Lot Help needed walking in hospital room?: Total Help needed climbing 3-5 steps with a railing? : Total 6 Click Score: 7    End of Session   Activity Tolerance: Treatment limited secondary to medical complications (Comment) (tachycardic) Patient left: in bed;with call bell/phone within reach;with SCD's reapplied Nurse Communication: Mobility status PT Visit Diagnosis: Muscle weakness (generalized) (M62.81);Other abnormalities of gait and mobility (R26.89);Unsteadiness on feet (R26.81);Adult, failure to thrive (R62.7)     Time: 0931-1000 PT Time Calculation (min) (ACUTE ONLY): 29 min  Charges:  $Therapeutic Activity: 8-22 mins                    G Codes:       Earney Navy, PTA Pager: [phone]     Darliss Cheney 10/12/2016, 11:38 AM

## 2016-10-13 ENCOUNTER — Inpatient Hospital Stay (HOSPITAL_COMMUNITY): Payer: BLUE CROSS/BLUE SHIELD

## 2016-10-13 LAB — CBC
HCT: 27.5 % — ABNORMAL LOW (ref 36.0–46.0)
Hemoglobin: 9.2 g/dL — ABNORMAL LOW (ref 12.0–15.0)
MCH: 31.9 pg (ref 26.0–34.0)
MCHC: 33.5 g/dL (ref 30.0–36.0)
MCV: 95.5 fL (ref 78.0–100.0)
PLATELETS: 101 10*3/uL — AB (ref 150–400)
RBC: 2.88 MIL/uL — ABNORMAL LOW (ref 3.87–5.11)
RDW: 19 % — AB (ref 11.5–15.5)
WBC: 8.3 10*3/uL (ref 4.0–10.5)

## 2016-10-13 LAB — COMPREHENSIVE METABOLIC PANEL
ALK PHOS: 302 U/L — AB (ref 38–126)
ALT: 16 U/L (ref 14–54)
AST: 23 U/L (ref 15–41)
Albumin: 1.1 g/dL — ABNORMAL LOW (ref 3.5–5.0)
Anion gap: 7 (ref 5–15)
BUN: 16 mg/dL (ref 6–20)
CHLORIDE: 109 mmol/L (ref 101–111)
CO2: 20 mmol/L — AB (ref 22–32)
CREATININE: 0.72 mg/dL (ref 0.44–1.00)
Calcium: 7 mg/dL — ABNORMAL LOW (ref 8.9–10.3)
GFR calc Af Amer: >60 mL/min (ref >60)
GFR calc non Af Amer: >60 mL/min (ref >60)
Glucose, Bld: 91 mg/dL (ref 65–99)
Potassium: 4.5 mmol/L (ref 3.5–5.1)
SODIUM: 136 mmol/L (ref 135–145)
Total Bilirubin: 0.6 mg/dL (ref 0.3–1.2)
Total Protein: 4.4 g/dL — ABNORMAL LOW (ref 6.5–8.1)

## 2016-10-13 LAB — MAGNESIUM: Magnesium: 1.6 mg/dL — ABNORMAL LOW (ref 1.7–2.4)

## 2016-10-13 LAB — GLUCOSE, CAPILLARY
GLUCOSE-CAPILLARY: 90 mg/dL (ref 65–99)
GLUCOSE-CAPILLARY: 93 mg/dL (ref 65–99)
Glucose-Capillary: 93 mg/dL (ref 65–99)
Glucose-Capillary: 93 mg/dL (ref 65–99)

## 2016-10-13 MED ORDER — SODIUM CHLORIDE 0.9 % IV BOLUS (SEPSIS)
250.0000 mL | Freq: Once | INTRAVENOUS | Status: AC
  Administered 2016-10-13: 250 mL via INTRAVENOUS

## 2016-10-13 MED ORDER — METOPROLOL TARTRATE 5 MG/5ML IV SOLN
5.0000 mg | Freq: Four times a day (QID) | INTRAVENOUS | Status: DC
  Administered 2016-10-13 – 2016-10-15 (×8): 5 mg via INTRAVENOUS
  Filled 2016-10-13 (×8): qty 5

## 2016-10-13 MED ORDER — DIATRIZOATE MEGLUMINE & SODIUM 66-10 % PO SOLN
90.0000 mL | Freq: Once | ORAL | Status: AC
  Administered 2016-10-13: 90 mL via NASOGASTRIC
  Filled 2016-10-13: qty 90

## 2016-10-13 MED ORDER — IOPAMIDOL (ISOVUE-300) INJECTION 61%
INTRAVENOUS | Status: AC
  Administered 2016-10-13: 100 mL
  Filled 2016-10-13: qty 100

## 2016-10-13 MED ORDER — FAMOTIDINE IN NACL 20-0.9 MG/50ML-% IV SOLN
20.0000 mg | Freq: Two times a day (BID) | INTRAVENOUS | Status: DC
  Administered 2016-10-13 – 2016-10-21 (×16): 20 mg via INTRAVENOUS
  Filled 2016-10-13 (×19): qty 50

## 2016-10-13 MED ORDER — SODIUM CHLORIDE 0.9 % IV SOLN
200.0000 mg | Freq: Once | INTRAVENOUS | Status: AC
  Administered 2016-10-13: 200 mg via INTRAVENOUS
  Filled 2016-10-13: qty 200

## 2016-10-13 MED ORDER — MAGNESIUM SULFATE 4 GM/100ML IV SOLN
4.0000 g | Freq: Once | INTRAVENOUS | Status: AC
  Administered 2016-10-13: 4 g via INTRAVENOUS
  Filled 2016-10-13: qty 100

## 2016-10-13 MED ORDER — SODIUM CHLORIDE 0.9 % IV SOLN
100.0000 mg | INTRAVENOUS | Status: DC
  Administered 2016-10-14 – 2016-10-24 (×11): 100 mg via INTRAVENOUS
  Filled 2016-10-13 (×15): qty 100

## 2016-10-13 MED ORDER — DEXTROSE-NACL 5-0.9 % IV SOLN
INTRAVENOUS | Status: DC
  Administered 2016-10-13 – 2016-10-14 (×3): via INTRAVENOUS

## 2016-10-13 MED ORDER — CEFTRIAXONE SODIUM IN DEXTROSE 40 MG/ML IV SOLN
2.0000 g | INTRAVENOUS | Status: DC
  Administered 2016-10-13 – 2016-10-15 (×3): 2 g via INTRAVENOUS
  Filled 2016-10-13 (×5): qty 50

## 2016-10-13 MED ORDER — METRONIDAZOLE IN NACL 5-0.79 MG/ML-% IV SOLN
500.0000 mg | Freq: Three times a day (TID) | INTRAVENOUS | Status: DC
  Administered 2016-10-13 – 2016-10-25 (×36): 500 mg via INTRAVENOUS
  Filled 2016-10-13 (×36): qty 100

## 2016-10-13 NOTE — Progress Notes (Signed)
PROGRESS NOTE    Haley Graham  RWE:[id number] DOB: [birth year] DOA: 09/20/2016 PCP: Haley Chroman, MD   Brief Narrative:  Haley Kalmar Alversonis Haley Graham 61 y.o.femalewith medical history significant of Haley Graham aortic dissection status post descending thoracic aortic stent graft placement per vascular surgery during last hospitalization from 06/20/2016-08/22/2016, Haley Graham history of diabetes mellitus with gastroparesis, gastroesophageal reflux disease, hypertension, IBS, abdominal aortic aneurysm, anxiety, depression and other comborids. She presented with weakness and met sepsis criteria initially with concern for enteritis.  She grew Haley Graham yeast UTI and on 8/18 1/2 blood cultures grew yeast and the patient was started on eraxis.  She was switched to fluconazole and antibiotics were d/c'd on 8/20 with blood cultures only growing candida.  ID recommended continuing fluconazole 400 mg daily through 9/26 and recommended ophtho exam within the next month.      She had an echo on  8/28 which showed new systolic dysfunction and her QT was markedly prolonged.  Cardiology was consulted and she was diuresed.   On 9/10 she developed new nausea and vomiting.  KUB showed concern for Haley Graham SBO.  CT confirmed this and also showed evidence of possible enteritis.  Repeat blood cultures obtained and started on ceftriaxone/flagyl.    Given severe malnutrition will need to consider TPN for Haley Graham.   Assessment & Plan:   Principal Problem:   Candidemia ([place]) Active Problems:   GERD (gastroesophageal reflux disease)   Dissection of thoracoabdominal aorta (HCC)   AAA (abdominal aortic aneurysm) (HCC)   Benign essential HTN   Diabetes mellitus type 2 in obese (HCC)   Anxiety state   Hyponatremia   Leukocytosis   Sepsis (HCC)   Hypotension   AKI (acute kidney injury) ([place])   Dehydration   Generalized weakness   Anemia   Thrombocytopenia (HCC)   Abnormal computed tomography angiography (CTA) of abdomen and  pelvis   Colitis: Probable   Protein-calorie malnutrition, severe   Palliative care by specialist   Adult failure to thrive   Hypophosphatemia   Hypomagnesemia   Depression   Acute systolic CHF (congestive heart failure) ([place])   DNR (do not resuscitate) discussion   Congestive heart failure (HCC)   Heme positive stool   Cardiac LV ejection fraction 30-35%   QT prolongation  # Small Bowel Obstruction  Enteritis?:   - surgery following, SB, protocol, KUB this morning with gastric distentioin with wall thickening - 900 cc out this morning of NG tube - ceftriaxone/flagyl for evidence of enteritis, although with her history, may be related to mesenteric ischemia. repeat blood cx as well due to tachy, but normal WBC, without fevers.   - d5 NS ordered x 3 days, continue to watch volume status with HF  # Sepsis due to candidemia: - discussed with ID, ID to see, switched to eraxis (caspofungin not on formulary) after discussion with ID -> continue until 9/26  _0  discussed with Haley Graham 9/7 who recommended follow up after discharge for ophthalmologic exam _1  some concern for thrush, ctm on eraxis, she's been on fluconazole   # Splenic infarcts:  Unclear cause, new since previous study per rads.  Discussed with tele today who note no arrhythmias since admission  Possibly related to thrombosis or dissection within aorta, though thrombosis is infrarenal and celiac comes off true lumen.    #Acute systolic congestive heart failure:  - holding lasix 20 mg PO daily, with tachycardia/N/V with above SBO _2  repeat echo with EF  35-40% and grade 2 diastolic dysfunction as well as hypokinesis of mid-apicalanteroseptal, anterior, and inferolateral myocardium (mildly improved from 8/28 echo at 30-35%) _0  follow up cardiology recs _1  cardiology rec f/u in clinic in 4-6 weeks with repeat echo -> cards considering repeat echo this week.  - may need Lexiscan Myoview versus cath. Denied shortness of breath  or chest pain today. Continue to monitor urine output and electrolytes. Cardiology consult appreciated. - on beta blocker, consider ace/arb in future  #Prolonged QTC: Over 600 today, repeat EKG.  D/c fluconazole.  D/c tramadol and hold cymbalta.  Zofran discontinued.  Discontinue Azithromycin and abilify.  Monitor in telemetry. Repeat EKG. _2  Magnesium, repleting  # Tachycardia: likely 2/2 SBO/N/V above.  Sinus tach on EKG.  Will continue to monitor. Gentle IVF.  Bolus 250 cc and repeat prn if tolerated well.   #Severe protein calorie malnutrition: Cortrak dc'd.  Will need to consider TPN again.  Discussed this with family today.   #Acute kidney injury - Improved  #Acute on Chronic anemia likely due to chronic disease vs acute GI bleed: Iron stores acceptable. Fecal occult blood test positive. On Protonix. Status post 2 unit of red blood cell transfusion on 8/30 with improvement in hemoglobin. Evaluated by GI. No further endoscopy at this time. Hemoglobin level stable. -Monitor CBC.  #Thrombocytopenia likely due to acute illness: Monitor CBC. Platelet count up and down over past few days. Monitor labs  #Anxiety depression: Evaluated by psychiatrist.   -Continue Xanax, holding cymbalta, andVyvanse per psychiatrist  # hypokalemia/hypomagnesemia: Repleted magnesium and potassium chloride. Monitor labs.  # Loose stools: negative c. diff  # Delirium:  Delirium prec, ctm  # Goals of care: requested palliative follow up with patient/family tomorrow.  Had long discussion with both the daughter and Haley Graham the husband about Haley Graham today.  Discussed that she was extremely sick and that it is hard to predict when these new complications would resolve (both asked about when she might be able to get to the SNF).  Discussed need for TPN again as well.    DVT prophylaxis: SCDs in place, thrombocytopenic Code Status: full code Family Communication: will call husband Haley Graham  Disposition Plan:  SNF    Consultants:   Cardiology, palliative, nutrition, GI, ID, psychiatry   Procedures: (Don't include imaging studies which can be auto populated. Include things that cannot be auto populated i.e. Echo, Carotid and venous dopplers, Foley, Bipap, HD, tubes/drains, wound vac, central lines etc)  Thoracentesis 8/17  Echo 8/28 EF 30-35%, akinesis of mid-apicalanteroseptal and anterior myocardium. Diastolic dysfunction.  Echo 8/13 EF 37-34, diastolic dysfunction, lipomatous hypertrophy  Echo 9/4 EF 35-40%, hypokinesis of mid apicalanteroseptal, anterior, and inferolateral myocardium, grade 2 diastolic dysfunction  Antimicrobials: (specify start and planned stop date. Auto populated tables are space occupying and do not give end dates)  Fluconazole, ceftriaxone, flagyl   Subjective: N/V.  Nausea better now.  Confused.   Objective: Vitals:   10/12/16 2000 10/13/16 0000 10/13/16 0400 10/13/16 0918  BP: 116/65 110/75 120/73 102/82  Pulse:    (!) 121  Resp: (!) _3 Temp: 98.7 F (37.1 C) 98.2 F (36.8 C) 97.8 F (36.6 C)   TempSrc: Other (Comment) Oral Oral   SpO2: 97% 100% 98%   Weight:   69.4 kg (153 lb 1.6 oz)   Height:        Intake/Output Summary (Last 24 hours) at 10/13/16 1019 Last data filed at 10/13/16 [phone]  Gross per 24 hour  Intake              850 ml  Output             1900 ml  Net            -1050 ml   Filed Weights   10/11/16 0507 10/12/16 0400 10/13/16 0400  Weight: 69 kg (152 lb 1.9 oz) 69.9 kg (154 lb) 69.4 kg (153 lb 1.6 oz)    Examination:  General: No acute distress. Cardiovascular: Heart sounds show Bronislaus Verdell regular rate, and rhythm. No gallops or rubs. No murmurs. No JVD. Lungs: Clear to auscultation bilaterally with good air movement. No rales, rhonchi or wheezes. Abdomen: Soft, tender diffusely, no rebound or guarding. Hypoactive bowel sounds. No masses. No hepatosplenomegaly. Neurological: Alert, but confused. Moves all extremities 4  with equal strength. Cranial nerves II through XII grossly intact. Skin: Warm and dry. No rashes or lesions. Extremities: No clubbing or cyanosis. No edema.   Data Reviewed: I have personally reviewed following labs and imaging studies  CBC:  Recent Labs Lab 10/09/16 0559 10/10/16 0343 10/11/16 0629 10/12/16 1647 10/13/16 0337  WBC 5.3 6.3 6.4 12.5* 8.3  HGB 9.5* 9.4* 9.8* 10.0* 9.2*  HCT 30.5* 30.0* 31.1* 30.8* 27.5*  MCV 98.4 98.4 98.7 95.4 95.5  PLT 62* 55* 68* 109* 161*   Basic Metabolic Panel:  Recent Labs Lab 10/07/16 0508  10/09/16 0318 10/10/16 0343 10/10/16 1405 10/11/16 0629 10/12/16 1439 10/13/16 0337  NA 137  < > 140 139  --  137 135 136  K 4.6  < > 4.3 4.0  --  4.4 5.0 4.5  CL 103  < > 107 108  --  105 104 109  CO2 27  < > 25 25  --  25 22 20*  GLUCOSE 96  < > 115* 126*  --  124* 122* 91  BUN 15  < > 21* 19  --  _0 CREATININE 1.31*  < > 0.79 0.76  --  0.70 0.76 0.72  CALCIUM 7.1*  < > 7.1* 7.2*  --  7.1* 7.5* 7.0*  MG 1.8  --   --   --  1.4* 2.2 1.7 1.6*  < > = values in this interval not displayed. GFR: Estimated Creatinine Clearance: 69.9 mL/min (by C-G formula based on SCr of 0.72 mg/dL). Liver Function Tests:  Recent Labs Lab 10/08/16 0230 10/09/16 0318 10/10/16 0343 10/11/16 0629 10/13/16 0337  AST _1 ALT _2 ALKPHOS 284* 308* 309* 318* 302*  BILITOT 0.8 0.7 0.5 0.5 0.6  PROT 4.4* 4.1* 4.2* 4.4* 4.4*  ALBUMIN 1.2* 1.1* 1.1* 1.1* 1.1*   No results for input(s): LIPASE, AMYLASE in the last 168 hours. No results for input(s): AMMONIA in the last 168 hours. Coagulation Profile: No results for input(s): INR, PROTIME in the last 168 hours. Cardiac Enzymes: No results for input(s): CKTOTAL, CKMB, CKMBINDEX, TROPONINI in the last 168 hours. BNP (last 3 results) No results for input(s): PROBNP in the last 8760 hours. HbA1C: No results for input(s): HGBA1C in the last 72 hours. CBG:  Recent Labs Lab  10/12/16 0619 10/12/16 1226 10/12/16 1726 10/12/16 2028 10/13/16 0604  GLUCAP 117* 135* 108* 111* 93   Lipid Profile: No results for input(s): CHOL, HDL, LDLCALC, TRIG, CHOLHDL, LDLDIRECT in the last 72 hours. Thyroid Function Tests: No results for input(s): TSH, T4TOTAL, FREET4, T3FREE,  THYROIDAB in the last 72 hours. Anemia Panel: No results for input(s): VITAMINB12, FOLATE, FERRITIN, TIBC, IRON, RETICCTPCT in the last 72 hours. Sepsis Labs: No results for input(s): PROCALCITON, LATICACIDVEN in the last 168 hours.  Recent Results (from the past 240 hour(s))  C difficile quick screen w PCR reflex     Status: None   Collection Time: 10/07/16 11:58 AM  Result Value Ref Range Status   C Diff antigen NEGATIVE NEGATIVE Final   C Diff toxin NEGATIVE NEGATIVE Final   C Diff interpretation No C. difficile detected.  Final         Radiology Studies: Dg Abd 1 View  Result Date: 10/12/2016 CLINICAL DATA:  Vomiting and low mid abdominal pain since this morning. EXAM: ABDOMEN - 1 VIEW COMPARISON:  09/10/2016. FINDINGS: Feeding tube terminates in the descending duodenum. There is marked gaseous distension of small bowel. Minimal colonic gas with gas seen in the rectum. IMPRESSION: Bowel-gas pattern is indicative of Zhi Geier small bowel obstruction. Electronically Signed   By: Lorin Picket M.D.   On: 10/12/2016 16:04   Ct Abdomen Pelvis W Contrast  Result Date: 10/13/2016 CLINICAL DATA:  Abdominal pain, nausea, and vomiting. History of gastroesophageal reflux disease, gastroparesis, diabetes. EXAM: CT ABDOMEN AND PELVIS WITH CONTRAST TECHNIQUE: Multidetector CT imaging of the abdomen and pelvis was performed using the standard protocol following bolus administration of intravenous contrast. CONTRAST:  158m ISOVUE-300 IOPAMIDOL (ISOVUE-300) INJECTION 61% COMPARISON:  09/17/2016, 07/18/2016 FINDINGS: Lower chest: Small to moderate bilateral pleural effusions with basilar atelectasis or  consolidation bilaterally, greater on the left. Dissection of the descending thoracic aorta with stent in the true lumen. Hepatobiliary: Mild diffuse fatty infiltration of the liver. No focal lesions identified. Surgical absence of the gallbladder. No bile duct dilatation. Pancreas: Unremarkable. No pancreatic ductal dilatation or surrounding inflammatory changes. Spleen: Wedge-shaped filling defects in the inferior spleen likely to represent splenic infarcts. Adrenals/Urinary Tract: 2.5 cm diameter heterogeneously enhancing adrenal gland nodule. No significant change since previous study. No other right adrenal gland nodules. Diffuse parenchymal atrophy and scarring of the left kidney. Heterogeneous nephrogram on the left may be due to infarcts or pyelonephritis. Right kidney is unremarkable. No hydronephrosis or hydroureter. The bladder is decompressed with Myalynn Lingle Foley catheter. Gas in the bladder is likely resulting from catheter insertion. Stomach/Bowel: There is an enteric tube with tip in the proximal duodenum. Stomach is mostly decompressed in the gastric wall is not significantly thickened. Small bowel are diffusely dilated and fluid-filled with thickening of the small bowel wall. The terminal ileum is decompressed with transition zone in the right lower quadrant. This suggests distal small bowel obstruction or partial obstruction. Wall thickening may be due to enteritis or inflammatory change. Ischemia not entirely excluded but there is no evidence of pneumatosis or portal venous gas. Visualized mesenteric artery and vein appear patent. Colon is decompressed. Appendix is not seen. Vascular/Lymphatic: Dissection of the abdominal aorta extending from the descending aorta through to the bifurcation. No aneurysm. There is thrombosis of the true lumen beginning at about the level of the renal arteries. Left renal artery arises from the true lumen and appears compromised with probable thrombosis of the left renal  artery. Right renal artery arises from the false lumen and is patent. Celiac axis and inferior mesenteric artery arise from the true lumen and are patent. Inferior mesenteric artery appears occluded. Dissection extends into the iliac arteries bilaterally with thrombosis of the true lumen. False lumen is patent. External and internal iliac arteries  appear patent. Vascular calcifications are present. Reproductive: Status post hysterectomy. No adnexal masses. Other: There is Vernon Ariel small amount of free fluid in the pelvis, mesenteric, pericolic gutters, and around the liver and spleen. This is likely ascites. Edema in the subcutaneous fat of the abdominal wall. No free air in the abdomen. Loculated fluid collection in the right groin region could represent Curtisha Bendix hematoma if there is been instrumentation in the groin. This was present previously. Fatty atrophy of the musculature. Musculoskeletal: No destructive bone lesions. IMPRESSION: 1. 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 False lumen is patent. This is unchanged since 07/18/2016 study 2. 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 Appearance is similar to previous study. 3. 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  Appear new since previous study. 4. Mild diffuse abdominal and pelvic ascites. 5. Early or partial small bowel obstruction with transition zone in the right lower quadrant. Small bowel wall thickening is likely due to enteritis or inflammatory change although ischemia not entirely excluded. No pneumatosis. Similar appearance on prior studies although there has been some progression in the interval. 6. The left adrenal gland nodule is unchanged. 7. Diffuse fatty infiltration of the liver. 8. Bilateral pleural effusions with basilar atelectasis or consolidation. These results were called by telephone at the time of interpretation on 10/13/2016 at 3:30 am to Dr. Chaney Malling , who verbally acknowledged  these results. Electronically Signed   By: Lucienne Capers M.D.   On: 10/13/2016 03:30   Dg Abd Portable 1v-small Bowel Protocol-position Verification  Result Date: 10/13/2016 CLINICAL DATA:  NG tube placement EXAM: PORTABLE ABDOMEN - 1 VIEW COMPARISON:  CT abdomen and pelvis 10/13/2016 FINDINGS: An enteric tube has been placed with tip in the right upper quadrant consistent with location in the distal stomach or possibly proximal duodenum. Gas-filled dilated small bowel consistent with small bowel obstruction. Small bowel wall thickening may indicate edema or inflammatory change. Residual contrast material in the renal collecting system. Surgical clips in the right upper quadrant. IMPRESSION: Enteric tube tip is in the right upper quadrant consistent with location in the distal stomach. Gaseous distention of small bowel with wall thickening suggesting obstruction and enteritis. Electronically Signed   By: Lucienne Capers M.D.   On: 10/13/2016 05:42        Scheduled Meds: . ALPRAZolam  0.25 mg Oral QHS  . aspirin EC  81 mg Oral Daily  . dronabinol  5 mg Oral BID AC  . feeding supplement (ENSURE ENLIVE)  237 mL Oral TID BM  . feeding supplement (PRO-STAT SUGAR FREE 64)  30 mL Per Tube TID  . feeding supplement (VITAL 1.5 CAL)  1,000 mL Per Tube Q24H  . insulin aspart  0-5 Units Subcutaneous QHS  . insulin aspart  0-9 Units Subcutaneous TID WC  . lisdexamfetamine  20 mg Oral Daily  . metoprolol tartrate  5 mg Intravenous Q6H  . scopolamine  1 patch Transdermal Q72H  . sodium chloride flush  3 mL Intravenous Q12H   Continuous Infusions: . sodium chloride 75 mL/hr at 10/13/16 0745  . anidulafungin     Followed by  . [START ON 10/14/2016] anidulafungin    . cefTRIAXone (ROCEPHIN)  IV    . famotidine (PEPCID) IV    . magnesium sulfate 1 - 4 g bolus IVPB    . metronidazole    . sodium chloride       LOS: 34 days    Time spent: 35 min  Fayrene Helper, MD [facility] [phone]   If 7PM-7AM, please contact night-coverage [email or web address] Password [email or web address] 10/13/2016, 10:19 AM

## 2016-10-13 NOTE — Consult Note (Addendum)
[facility]ious [facility]    Date of Admission:  09/08/2016              Anidulafungin 10/13/16 -           Fluconazole 09/17/16 - 10/12/16           Ceftriaxone 10/13/16 -           Metronidazole 10/13/16 -  Reason for Consult: Candidemia    Referring Provider: Erling Cruz, MD Primary Care Provider: Jerene Bears, MD  Assessment: - Candidemia - Prolonged QTc - Small bowel obstruction - Small bowel wall thickening (enteritis vs. inflammation vs. ischemia) on CT - Diabetes mellitus with gastroparesis - Severe protein-calorie malnutrition (albumin 1.1)  Plan: 1. Discontinue fluconazole due to prolonged QTc (604 today); start echinocandin (micafungin). Plan for total of 6 total weeks of antifungal therapy, with end date of 10/28/16. 2. Continue telemetry monitoring. 3. Continue ceftriaxone and metronidazole for now in the setting of possible enteritis. 4. F/u blood cultures collected 10/13/16. 5. If blood cultures remain clear, OK to place PICC (or central) line for long-term TPN. 6. Goals of Care  Principal Problem:   Candidemia ([place]) Active Problems:   GERD (gastroesophageal reflux disease)   Dissection of thoracoabdominal aorta (HCC)   AAA (abdominal aortic aneurysm) (HCC)   Benign essential HTN   Diabetes mellitus type 2 in obese (HCC)   Anxiety state   Hyponatremia   Leukocytosis   Sepsis (HCC)   Hypotension   AKI (acute kidney injury) ([place])   Dehydration   Generalized weakness   Anemia   Thrombocytopenia (HCC)   Abnormal computed tomography angiography (CTA) of abdomen and pelvis   Colitis: Probable   Protein-calorie malnutrition, severe   Palliative care by specialist   Adult failure to thrive   Hypophosphatemia   Hypomagnesemia   Depression   Acute systolic CHF (congestive heart failure) ([place])   DNR (do not resuscitate) discussion   Congestive heart failure (HCC)   Heme positive stool   Cardiac LV ejection fraction 30-35%   QT  prolongation   . ALPRAZolam  0.25 mg Oral QHS  . aspirin EC  81 mg Oral Daily  . dronabinol  5 mg Oral BID AC  . feeding supplement (ENSURE ENLIVE)  237 mL Oral TID BM  . feeding supplement (PRO-STAT SUGAR FREE 64)  30 mL Per Tube TID  . feeding supplement (VITAL 1.5 CAL)  1,000 mL Per Tube Q24H  . insulin aspart  0-5 Units Subcutaneous QHS  . insulin aspart  0-9 Units Subcutaneous TID WC  . lisdexamfetamine  20 mg Oral Daily  . metoprolol tartrate  5 mg Intravenous Q6H  . scopolamine  1 patch Transdermal Q72H  . sodium chloride flush  3 mL Intravenous Q12H    HPI: Haley Graham is a 61 y.o. female with a history of recent prolonged hospitalization 06/20/16-08/22/16 for aortic dissection with descending thoracic aortic stent graft placement per vascular surgery, diabetes mellitus with gastroparesis, severe protein-calorie malnutrition (albumin 1.1), anxiety/depression, and numerous other medical co-morobidities admitted 09/11/2016 with sepsis. Initial blood cultures collected 09/04/2016 were negative x2. Repeat blood cultures were collected 09/16/16 with 1/2 positive for Candida albicans. She was started on fluconazole 400mg  daily on 09/17/16. Third set of blood cultures collected 09/20/16 were negative x2. Hospitalization has been complicated by acute systolic congestive heart failure, prolonged QTc interval (604 today), and difficulty tolerating NG tube feeds, now with nausea/vomiting/abdominal pain and evidence  of SBO with small bowel wall thickening (enteritis vs. inflammation vs. ischemia) on CT 10/13/16.  Currently, Haley Graham is feeling "okay." She denies fevers/chills or weakness. Her primary concern is continued nausea/vomiting/abdominal pain, although she does feel that this is improving.  Review of Systems: Review of Systems  Constitutional: Negative for chills and fever.  HENT: Negative for congestion and sore throat.   Eyes: Negative for blurred vision.  Respiratory: Negative  for cough and shortness of breath.   Cardiovascular: Negative for chest pain, palpitations and leg swelling.  Gastrointestinal: Positive for abdominal pain, nausea and vomiting.  Musculoskeletal: Negative for joint pain and myalgias.  Skin: Negative for rash.  Neurological: Positive for weakness. Negative for headaches.       Mild confusion    Past Medical History:  Diagnosis Date  . Anxiety   . Arthritis   . Diabetes mellitus without complication ([place])   . Gastroparesis   . GERD (gastroesophageal reflux disease)   . Hypertension   . IBS (irritable bowel syndrome)     Social History  Substance Use Topics  . Smoking status: Current Every Day Smoker    Packs/day: 0.75    Years: 23.00    Types: Cigarettes  . Smokeless tobacco: Never Used     Comment: 5-6 cigarettes. Cut back about 3 weeks ago. Smoking since age 79.  Marland Kitchen Alcohol use No    Family History  Problem Relation Age of Onset  . Cancer Mother   . Stroke Mother   . Heart attack Mother   . Stroke Father    Allergies  Allergen Reactions  . Penicillins Rash and Other (See Comments)    PATIENT HAS HAD A PCN REACTION WITH IMMEDIATE RASH, FACIAL/TONGUE/THROAT SWELLING, SOB, OR LIGHTHEADEDNESS WITH HYPOTENSION:  #  #  #  YES  #  #  #   Has patient had a PCN reaction causing severe rash involving mucus membranes or skin necrosis: no Has patient had a PCN reaction that required hospitalization no Has patient had a PCN reaction occurring within the last 10 years:no If all of the above answers are "NO", then may proceed with Cephalosporin use.  . Ativan [Lorazepam] Other (See Comments)    Makes pt very confused and more agitated, irritable.  . Codeine Nausea And Vomiting  . Reglan [Metoclopramide] Other (See Comments)    TACHYCARDIA    OBJECTIVE: Blood pressure 130/68, pulse (!) 129, temperature 97.8 F (36.6 C), temperature source Oral, resp. rate 20, height 5\' 3"  (1.6 m), weight 69.4 kg (153 lb 1.6 oz), SpO2 98  %.  Physical Exam  General: Tired-appearing female lying in bed; appears mildly uncomfortable. HEENT: Dry mucus membranes. NG tube in place and draining yellowish fluid. Cardiac: Tachycardic; regular rhythm. No murmurs appreciated. Respiratory: Normal work of breathing on room air. Clear to auscultation bilaterally. Abdomen: Soft. Mildly tender to palpation throughout without rebound or guarding. Hypoactive bowel sounds. Skin: Diffuse ecchymoses over chest and upper extremities. Extremities: Warm and dry. No lower extremity edema. Neurologic: Alert and oriented x 3. No focal deficits.  Lab Results Lab Results  Component Value Date   WBC 8.3 10/13/2016   HGB 9.2 (L) 10/13/2016   HCT 27.5 (L) 10/13/2016   MCV 95.5 10/13/2016   PLT 101 (L) 10/13/2016    Lab Results  Component Value Date   CREATININE 0.72 10/13/2016   BUN 16 10/13/2016   NA 136 10/13/2016   K 4.5 10/13/2016   CL 109 10/13/2016   CO2 20 (  L) 10/13/2016    Lab Results  Component Value Date   ALT 16 10/13/2016   AST 23 10/13/2016   ALKPHOS 302 (H) 10/13/2016   BILITOT 0.6 10/13/2016     Microbiology: Recent Results (from the past 240 hour(s))  C difficile quick screen w PCR reflex     Status: None   Collection Time: 10/07/16 11:58 AM  Result Value Ref Range Status   C Diff antigen NEGATIVE NEGATIVE Final   C Diff toxin NEGATIVE NEGATIVE Final   C Diff interpretation No C. difficile detected.  Final    Madelynn Done, [facility] for [facility] [place] [facility] [phone] pager   [phone] cell 10/13/2016

## 2016-10-13 NOTE — Progress Notes (Signed)
Progress Note  Patient Name: Haley Graham Date of Encounter: 10/13/2016  Primary Cardiologist: New  Subjective   Tube feeds stopped due to incessant nausea and vomiting. CT showed small bowel obstruction with thickened bowel wall (enteritis versus ischemia) also evidence of splenic infarcts on CT. With conservative management (NG suction) it seems that she is reestablishing intestinal transit; had a tiny BM last night.  Denies dyspnea, lying fully supine in bed. Remains tearful and depressed. Focusing on tiny details in her care and interested in restarting pain medications.  Inpatient Medications    Scheduled Meds: . ALPRAZolam  0.25 mg Oral QHS  . aspirin EC  81 mg Oral Daily  . dronabinol  5 mg Oral BID AC  . feeding supplement (ENSURE ENLIVE)  237 mL Oral TID BM  . feeding supplement (PRO-STAT SUGAR FREE 64)  30 mL Per Tube TID  . feeding supplement (VITAL 1.5 CAL)  1,000 mL Per Tube Q24H  . insulin aspart  0-5 Units Subcutaneous QHS  . insulin aspart  0-9 Units Subcutaneous TID WC  . lisdexamfetamine  20 mg Oral Daily  . metoprolol tartrate  5 mg Intravenous Q6H  . scopolamine  1 patch Transdermal Q72H  . sodium chloride flush  3 mL Intravenous Q12H   Continuous Infusions: . anidulafungin     Followed by  . [START ON 10/14/2016] anidulafungin    . cefTRIAXone (ROCEPHIN)  IV    . famotidine (PEPCID) IV 20 mg (10/13/16 1423)  . metronidazole     PRN Meds: acetaminophen **OR** acetaminophen, alum & mag hydroxide-simeth, diphenhydrAMINE, diphenhydrAMINE, fluticasone, ipratropium-albuterol, lidocaine, menthol-cetylpyridinium, senna-docusate, sodium phosphate   Vital Signs    Vitals:   10/13/16 0000 10/13/16 0400 10/13/16 0918 10/13/16 1223  BP: 110/75 120/73 102/82 130/68  Pulse:   (!) 121 (!) 129  Resp: 19 20    Temp: 98.2 F (36.8 C) 97.8 F (36.6 C)    TempSrc: Oral Oral    SpO2: 100% 98%    Weight:  153 lb 1.6 oz (69.4 kg)    Height:         Intake/Output Summary (Last 24 hours) at 10/13/16 1437 Last data filed at 10/13/16 0748  Gross per 24 hour  Intake              850 ml  Output             1900 ml  Net            -1050 ml   Filed Weights   10/11/16 0507 10/12/16 0400 10/13/16 0400  Weight: 152 lb 1.9 oz (69 kg) 154 lb (69.9 kg) 153 lb 1.6 oz (69.4 kg)    Telemetry    Sinus tachycardia - Personally Reviewed  ECG    Sinus tachycardia, prolonged QT interval worse than yesterday over 600 ms, anterolateral T-wave inversion - Personally Reviewed  Physical Exam  Depressed, ill appearing, tearful GEN:  breathing comfortably lying fully supine Neck: No JVD Cardiac: RRR, no murmurs, rubs, or gallops.  Respiratory: Clear to auscultation bilaterally. GI: Soft, nontender, non-distended  MS: No edema; No deformity. Neuro:  Nonfocal  Psych: Depressed affect   Labs    Chemistry Recent Labs Lab 10/10/16 0343 10/11/16 0629 10/12/16 1439 10/13/16 0337  NA 139 137 135 136  K 4.0 4.4 5.0 4.5  CL 108 105 104 109  CO2 25 25 22  20*  GLUCOSE 126* 124* 122* 91  BUN 19 18 17 16   CREATININE 0.76  0.70 0.76 0.72  CALCIUM 7.2* 7.1* 7.5* 7.0*  PROT 4.2* 4.4*  --  4.4*  ALBUMIN 1.1* 1.1*  --  1.1*  AST 21 22  --  23  ALT 15 16  --  16  ALKPHOS 309* 318*  --  302*  BILITOT 0.5 0.5  --  0.6  GFRNONAA >60 >60 >60 >60  GFRAA >60 >60 >60 >60  ANIONGAP 6 7 9 7      Hematology Recent Labs Lab 10/11/16 0629 10/12/16 1647 10/13/16 0337  WBC 6.4 12.5* 8.3  RBC 3.15* 3.23* 2.88*  HGB 9.8* 10.0* 9.2*  HCT 31.1* 30.8* 27.5*  MCV 98.7 95.4 95.5  MCH 31.1 31.0 31.9  MCHC 31.5 32.5 33.5  RDW 19.1* 18.5* 19.0*  PLT 68* 109* 101*    Cardiac EnzymesNo results for input(s): TROPONINI in the last 168 hours. No results for input(s): TROPIPOC in the last 168 hours.   BNPNo results for input(s): BNP, PROBNP in the last 168 hours.   DDimer No results for input(s): DDIMER in the last 168 hours.   Radiology    Dg Abd 1  View  Result Date: 10/12/2016 CLINICAL DATA:  Vomiting and low mid abdominal pain since this morning. EXAM: ABDOMEN - 1 VIEW COMPARISON:  09/14/2016. FINDINGS: Feeding tube terminates in the descending duodenum. There is marked gaseous distension of small bowel. Minimal colonic gas with gas seen in the rectum. IMPRESSION: Bowel-gas pattern is indicative of a small bowel obstruction. Electronically Signed   By: Lorin Picket M.D.   On: 10/12/2016 16:04   Ct Abdomen Pelvis W Contrast  Result Date: 10/13/2016 CLINICAL DATA:  Abdominal pain, nausea, and vomiting. History of gastroesophageal reflux disease, gastroparesis, diabetes. EXAM: CT ABDOMEN AND PELVIS WITH CONTRAST TECHNIQUE: Multidetector CT imaging of the abdomen and pelvis was performed using the standard protocol following bolus administration of intravenous contrast. CONTRAST:  136mL ISOVUE-300 IOPAMIDOL (ISOVUE-300) INJECTION 61% COMPARISON:  09/17/2016, 07/18/2016 FINDINGS: Lower chest: Small to moderate bilateral pleural effusions with basilar atelectasis or consolidation bilaterally, greater on the left. Dissection of the descending thoracic aorta with stent in the true lumen. Hepatobiliary: Mild diffuse fatty infiltration of the liver. No focal lesions identified. Surgical absence of the gallbladder. No bile duct dilatation. Pancreas: Unremarkable. No pancreatic ductal dilatation or surrounding inflammatory changes. Spleen: Wedge-shaped filling defects in the inferior spleen likely to represent splenic infarcts. Adrenals/Urinary Tract: 2.5 cm diameter heterogeneously enhancing adrenal gland nodule. No significant change since previous study. No other right adrenal gland nodules. Diffuse parenchymal atrophy and scarring of the left kidney. Heterogeneous nephrogram on the left may be due to infarcts or pyelonephritis. Right kidney is unremarkable. No hydronephrosis or hydroureter. The bladder is decompressed with a Foley catheter. Gas in the  bladder is likely resulting from catheter insertion. Stomach/Bowel: There is an enteric tube with tip in the proximal duodenum. Stomach is mostly decompressed in the gastric wall is not significantly thickened. Small bowel are diffusely dilated and fluid-filled with thickening of the small bowel wall. The terminal ileum is decompressed with transition zone in the right lower quadrant. This suggests distal small bowel obstruction or partial obstruction. Wall thickening may be due to enteritis or inflammatory change. Ischemia not entirely excluded but there is no evidence of pneumatosis or portal venous gas. Visualized mesenteric artery and vein appear patent. Colon is decompressed. Appendix is not seen. Vascular/Lymphatic: Dissection of the abdominal aorta extending from the descending aorta through to the bifurcation. No aneurysm. There is thrombosis of the  true lumen beginning at about the level of the renal arteries. Left renal artery arises from the true lumen and appears compromised with probable thrombosis of the left renal artery. Right renal artery arises from the false lumen and is patent. Celiac axis and inferior mesenteric artery arise from the true lumen and are patent. Inferior mesenteric artery appears occluded. Dissection extends into the iliac arteries bilaterally with thrombosis of the true lumen. False lumen is patent. External and internal iliac arteries appear patent. Vascular calcifications are present. Reproductive: Status post hysterectomy. No adnexal masses. Other: There is a small amount of free fluid in the pelvis, mesenteric, pericolic gutters, and around the liver and spleen. This is likely ascites. Edema in the subcutaneous fat of the abdominal wall. No free air in the abdomen. Loculated fluid collection in the right groin region could represent a hematoma if there is been instrumentation in the groin. This was present previously. Fatty atrophy of the musculature. Musculoskeletal: No  destructive bone lesions. IMPRESSION: 1. 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 False lumen is patent. This is unchanged since 07/18/2016 study 2. 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 Appearance is similar to previous study. 3. 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  Appear new since previous study. 4. Mild diffuse abdominal and pelvic ascites. 5. Early or partial small bowel obstruction with transition zone in the right lower quadrant. Small bowel wall thickening is likely due to enteritis or inflammatory change although ischemia not entirely excluded. No pneumatosis. Similar appearance on prior studies although there has been some progression in the interval. 6. The left adrenal gland nodule is unchanged. 7. Diffuse fatty infiltration of the liver. 8. Bilateral pleural effusions with basilar atelectasis or consolidation. These results were called by telephone at the time of interpretation on 10/13/2016 at 3:30 am to Dr. Chaney Malling , who verbally acknowledged these results. Electronically Signed   By: Lucienne Capers M.D.   On: 10/13/2016 03:30   Dg Abd Portable 1v-small Bowel Protocol-position Verification  Result Date: 10/13/2016 CLINICAL DATA:  NG tube placement EXAM: PORTABLE ABDOMEN - 1 VIEW COMPARISON:  CT abdomen and pelvis 10/13/2016 FINDINGS: An enteric tube has been placed with tip in the right upper quadrant consistent with location in the distal stomach or possibly proximal duodenum. Gas-filled dilated small bowel consistent with small bowel obstruction. Small bowel wall thickening may indicate edema or inflammatory change. Residual contrast material in the renal collecting system. Surgical clips in the right upper quadrant. IMPRESSION: Enteric tube tip is in the right upper quadrant consistent with location in the distal stomach. Gaseous distention of small bowel with wall thickening suggesting obstruction and enteritis.  Electronically Signed   By: Lucienne Capers M.D.   On: 10/13/2016 05:42    Cardiac Studies   Echocardiogram 10/06/16: Study Conclusions - Left ventricle: The cavity size was normal. Systolic function was moderately reduced. The estimated ejection fraction was in the range of 35% to 40%. Hypokinesis of the mid-apicalanteroseptal, anterior, and inferolateral myocardium. Features are consistent with a pseudonormal left ventricular filling pattern, with concomitant abnormal relaxation and increased filling pressure (grade 2 diastolic dysfunction). - Aortic valve: Transvalvular velocity was within the normal range. There was no stenosis. There was no regurgitation. - Mitral valve: Transvalvular velocity was within the normal range. There was no evidence for stenosis. There was trivial regurgitation. - Left atrium: The atrium was mildly dilated. - Right ventricle: The cavity size was normal. Wall thickness was normal. Systolic  function was normal. - Tricuspid valve: There was no regurgitation.  Patient Profile     61 y.o. 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 Heart failure symptoms resolved promptly with diuretic therapy. Follow-up echo performed after one week showed slight improvement in EF 35-40%. Waxing and waning QT prolongation and ECG repolarization abnormalities in the anterolateral distribution.   Assessment & Plan    1.CHF/Cardiomyopathy:Appears more dynamic compensated, despite all the  problems with nausea and vomiting. Check echo again tomorrow. If EF and wall motion have not yet resolved, this would essentially exclude stress cardiomyopathy. Ischemia would then be the clear cause for her wall motion abnormality and low EF, although it is still a challenge to decide what we would do about it. She remains too ill to consider invasive procedure such as angiography, especially when one takes into account the risks of approach from the right upper extremity and innominate artery in the vicinity of the proximal struts of the thoracic aortic stent. In addition, if we were to identify LAD disease, need to ensure that she would be able to take dual antiplatelet therapy without interruption for a minimum of 30 days if we use a bare-metal stent. With recent problems with small bowel obstruction and vomiting, this would've been a big challenge. We'll discuss with interventional colleagues. 2. Ao dissection: Chest CT August 26 does not show dissection the proximal ascending aorta. On metoprolol, now being administered intravenously. Blood pressure should be checked only in the right upper extremity. 3. Anemia:Stable since transfusion 10/01/2016. Macrocytic, hyporegenerative, no evidence of iron or vitamin deficiency, associated with persistent thrombocytopenia. Suggests bone marrow insufficiency (infection or drug related?) and consider hematology consultation. 4. QT prolongation:Fluconazole has been discontinued and replaced with an alternative antifungal medication starting today. Waxing and waning QT could well be explainedby coronary insufficiency . Avoid agents that can prolong the QT interval further. 5. Severe malnutrition: albumin 1.1  Very complex patient, with multiple severe and life-threatening conditions, extreme malnutrition. Prognosis is dismal.  For questions or updates, please contact [place] Please consult [email or web address] for contact info under Cardiology/STEMI.       Signed, Sanda Klein, MD  10/13/2016, 2:37 PM

## 2016-10-13 NOTE — Progress Notes (Signed)
Subjective/Chief Complaint:nausea   Pt about the same had a BM last night nausea    Objective: Vital signs in last 24 hours: Temp:  [97.7 F (36.5 C)-98.7 F (37.1 C)] 97.8 F (36.6 C) (09/11 0400) Pulse Rate:  [115-129] 121 (09/11 0918) Resp:  [17-21] 20 (09/11 0400) BP: (102-132)/(52-82) 102/82 (09/11 0918) SpO2:  [97 %-100 %] 98 % (09/11 0400) Weight:  [69.4 kg (153 lb 1.6 oz)] 69.4 kg (153 lb 1.6 oz) (09/11 0400) Last BM Date: 10/11/16  Intake/Output from previous day: 09/10 0701 - 09/11 0700 In: 850 [I.V.:850] Out: 1000 [Urine:600; Emesis/NG output:400] Intake/Output this shift: Total I/O In: -  Out: 900 [Emesis/NG output:900]  GI: soft slight distension no rebound or guarding   Lab Results:   Recent Labs  10/12/16 1647 10/13/16 0337  WBC 12.5* 8.3  HGB 10.0* 9.2*  HCT 30.8* 27.5*  PLT 109* 101*   BMET  Recent Labs  10/12/16 1439 10/13/16 0337  NA 135 136  K 5.0 4.5  CL 104 109  CO2 22 20*  GLUCOSE 122* 91  BUN 17 16  CREATININE 0.76 0.72  CALCIUM 7.5* 7.0*   PT/INR No results for input(s): LABPROT, INR in the last 72 hours. ABG No results for input(s): PHART, HCO3 in the last 72 hours.  Invalid input(s): PCO2, PO2  Studies/Results: Dg Abd 1 View  Result Date: 10/12/2016 CLINICAL DATA:  Vomiting and low mid abdominal pain since this morning. EXAM: ABDOMEN - 1 VIEW COMPARISON:  09/07/2016. FINDINGS: Feeding tube terminates in the descending duodenum. There is marked gaseous distension of small bowel. Minimal colonic gas with gas seen in the rectum. IMPRESSION: Bowel-gas pattern is indicative of a small bowel obstruction. Electronically Signed   By: Lorin Picket M.D.   On: 10/12/2016 16:04   Ct Abdomen Pelvis W Contrast  Result Date: 10/13/2016 CLINICAL DATA:  Abdominal pain, nausea, and vomiting. History of gastroesophageal reflux disease, gastroparesis, diabetes. EXAM: CT ABDOMEN AND PELVIS WITH CONTRAST TECHNIQUE: Multidetector CT  imaging of the abdomen and pelvis was performed using the standard protocol following bolus administration of intravenous contrast. CONTRAST:  138mL ISOVUE-300 IOPAMIDOL (ISOVUE-300) INJECTION 61% COMPARISON:  09/17/2016, 07/18/2016 FINDINGS: Lower chest: Small to moderate bilateral pleural effusions with basilar atelectasis or consolidation bilaterally, greater on the left. Dissection of the descending thoracic aorta with stent in the true lumen. Hepatobiliary: Mild diffuse fatty infiltration of the liver. No focal lesions identified. Surgical absence of the gallbladder. No bile duct dilatation. Pancreas: Unremarkable. No pancreatic ductal dilatation or surrounding inflammatory changes. Spleen: Wedge-shaped filling defects in the inferior spleen likely to represent splenic infarcts. Adrenals/Urinary Tract: 2.5 cm diameter heterogeneously enhancing adrenal gland nodule. No significant change since previous study. No other right adrenal gland nodules. Diffuse parenchymal atrophy and scarring of the left kidney. Heterogeneous nephrogram on the left may be due to infarcts or pyelonephritis. Right kidney is unremarkable. No hydronephrosis or hydroureter. The bladder is decompressed with a Foley catheter. Gas in the bladder is likely resulting from catheter insertion. Stomach/Bowel: There is an enteric tube with tip in the proximal duodenum. Stomach is mostly decompressed in the gastric wall is not significantly thickened. Small bowel are diffusely dilated and fluid-filled with thickening of the small bowel wall. The terminal ileum is decompressed with transition zone in the right lower quadrant. This suggests distal small bowel obstruction or partial obstruction. Wall thickening may be due to enteritis or inflammatory change. Ischemia not entirely excluded but there is no evidence of pneumatosis  or portal venous gas. Visualized mesenteric artery and vein appear patent. Colon is decompressed. Appendix is not seen.  Vascular/Lymphatic: Dissection of the abdominal aorta extending from the descending aorta through to the bifurcation. No aneurysm. There is thrombosis of the true lumen beginning at about the level of the renal arteries. Left renal artery arises from the true lumen and appears compromised with probable thrombosis of the left renal artery. Right renal artery arises from the false lumen and is patent. Celiac axis and inferior mesenteric artery arise from the true lumen and are patent. Inferior mesenteric artery appears occluded. Dissection extends into the iliac arteries bilaterally with thrombosis of the true lumen. False lumen is patent. External and internal iliac arteries appear patent. Vascular calcifications are present. Reproductive: Status post hysterectomy. No adnexal masses. Other: There is a small amount of free fluid in the pelvis, mesenteric, pericolic gutters, and around the liver and spleen. This is likely ascites. Edema in the subcutaneous fat of the abdominal wall. No free air in the abdomen. Loculated fluid collection in the right groin region could represent a hematoma if there is been instrumentation in the groin. This was present previously. Fatty atrophy of the musculature. Musculoskeletal: No destructive bone lesions. IMPRESSION: 1. 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 False lumen is patent. This is unchanged since 07/18/2016 study 2. 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 Appearance is similar to previous study. 3. 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  Appear new since previous study. 4. Mild diffuse abdominal and pelvic ascites. 5. Early or partial small bowel obstruction with transition zone in the right lower quadrant. Small bowel wall thickening is likely due to enteritis or inflammatory change although ischemia not entirely excluded. No pneumatosis. Similar appearance on prior studies although there has been some  progression in the interval. 6. The left adrenal gland nodule is unchanged. 7. Diffuse fatty infiltration of the liver. 8. Bilateral pleural effusions with basilar atelectasis or consolidation. These results were called by telephone at the time of interpretation on 10/13/2016 at 3:30 am to Dr. Chaney Malling , who verbally acknowledged these results. Electronically Signed   By: Lucienne Capers M.D.   On: 10/13/2016 03:30   Dg Abd Portable 1v-small Bowel Protocol-position Verification  Result Date: 10/13/2016 CLINICAL DATA:  NG tube placement EXAM: PORTABLE ABDOMEN - 1 VIEW COMPARISON:  CT abdomen and pelvis 10/13/2016 FINDINGS: An enteric tube has been placed with tip in the right upper quadrant consistent with location in the distal stomach or possibly proximal duodenum. Gas-filled dilated small bowel consistent with small bowel obstruction. Small bowel wall thickening may indicate edema or inflammatory change. Residual contrast material in the renal collecting system. Surgical clips in the right upper quadrant. IMPRESSION: Enteric tube tip is in the right upper quadrant consistent with location in the distal stomach. Gaseous distention of small bowel with wall thickening suggesting obstruction and enteritis. Electronically Signed   By: Lucienne Capers M.D.   On: 10/13/2016 05:42    Anti-infectives: Anti-infectives    Start     Dose/Rate Route Frequency Ordered Stop   09/27/16 2200  erythromycin 250 mg in sodium chloride 0.9 % 100 mL IVPB  Status:  Discontinued     250 mg 100 mL/hr over 60 Minutes Intravenous Every 8 hours 09/27/16 1842 09/27/16 1959   09/27/16 2200  erythromycin (EES) 400 MG/5ML suspension 250 mg  Status:  Discontinued     250 mg Oral Every 8 hours 09/27/16 1959  09/29/16 1147   09/24/16 1300  fluconazole (DIFLUCAN) tablet 400 mg     400 mg Oral Daily 09/24/16 1138 10/28/16 2359   09/21/16 1400  fluconazole (DIFLUCAN) IVPB 400 mg  Status:  Discontinued     400 mg 100 mL/hr  over 120 Minutes Intravenous Every 24 hours 09/20/16 1220 09/20/16 1221   09/21/16 0000  anidulafungin (ERAXIS) 50 mg in sodium chloride 0.9 % 50 mL IVPB  Status:  Discontinued     50 mg 78 mL/hr over 50 Minutes Intravenous Every 24 hours 09/19/16 0345 09/19/16 0346   09/20/16 1400  fluconazole (DIFLUCAN) IVPB 800 mg  Status:  Discontinued     800 mg 200 mL/hr over 120 Minutes Intravenous  Once 09/20/16 1220 09/20/16 1221   09/20/16 1400  fluconazole (DIFLUCAN) IVPB 400 mg  Status:  Discontinued     400 mg 100 mL/hr over 120 Minutes Intravenous Every 24 hours 09/20/16 1221 09/24/16 1137   09/19/16 2200  anidulafungin (ERAXIS) 100 mg in sodium chloride 0.9 % 100 mL IVPB  Status:  Discontinued     100 mg 78 mL/hr over 100 Minutes Intravenous Every 24 hours 09/19/16 0345 09/20/16 1220   09/19/16 0400  anidulafungin (ERAXIS) 200 mg in sodium chloride 0.9 % 200 mL IVPB     200 mg 78 mL/hr over 200 Minutes Intravenous  Once 09/19/16 0345 09/19/16 0819   09/18/16 1800  fluconazole (DIFLUCAN) IVPB 100 mg  Status:  Discontinued     100 mg 50 mL/hr over 60 Minutes Intravenous Every 24 hours 09/18/16 1109 09/19/16 1937   09/17/16 1800  ceFEPIme (MAXIPIME) 2 g in dextrose 5 % 50 mL IVPB  Status:  Discontinued     2 g 100 mL/hr over 30 Minutes Intravenous Every 24 hours 09/17/16 1243 09/21/16 1200   09/17/16 1800  fluconazole (DIFLUCAN) IVPB 200 mg  Status:  Discontinued     200 mg 100 mL/hr over 60 Minutes Intravenous Every 24 hours 09/17/16 1651 09/18/16 1109   09/12/16 1400  ceFEPIme (MAXIPIME) 1 g in dextrose 5 % 50 mL IVPB  Status:  Discontinued     1 g 100 mL/hr over 30 Minutes Intravenous Every 8 hours 09/12/16 1127 09/17/16 1243   09/11/16 1600  metroNIDAZOLE (FLAGYL) IVPB 500 mg  Status:  Discontinued     500 mg 100 mL/hr over 60 Minutes Intravenous Every 8 hours 09/11/16 0902 09/21/16 1200   09/11/16 1400  ceFEPIme (MAXIPIME) 1 g in dextrose 5 % 50 mL IVPB  Status:  Discontinued     1  g 100 mL/hr over 30 Minutes Intravenous Every 24 hours 09/11/16 1234 09/12/16 1127   09/10/16 2000  vancomycin (VANCOCIN) IVPB 1000 mg/200 mL premix  Status:  Discontinued     1,000 mg 200 mL/hr over 60 Minutes Intravenous Every 24 hours 09/06/2016 2130 09/10/16 1616   09/10/16 1800  vancomycin (VANCOCIN) IVPB 750 mg/150 ml premix  Status:  Discontinued     750 mg 150 mL/hr over 60 Minutes Intravenous Every 12 hours 09/10/16 1616 09/12/16 1145   09/10/16 1400  levofloxacin (LEVAQUIN) IVPB 500 mg  Status:  Discontinued     500 mg 100 mL/hr over 60 Minutes Intravenous Every 24 hours 09/21/2016 2112 09/11/16 1220   09/10/16 0115  vancomycin (VANCOCIN) IVPB 1000 mg/200 mL premix  Status:  Discontinued     1,000 mg 200 mL/hr over 60 Minutes Intravenous  Once 09/10/16 0110 09/10/16 0118   09/27/2016 2200  metroNIDAZOLE (FLAGYL) IVPB  500 mg  Status:  Discontinued     500 mg 100 mL/hr over 60 Minutes Intravenous Every 6 hours 10/01/2016 2110 09/11/16 0902   10/02/2016 2115  vancomycin (VANCOCIN) IVPB 1000 mg/200 mL premix     1,000 mg 200 mL/hr over 60 Minutes Intravenous  Once 09/21/2016 2108 09/27/2016 2323   09/26/2016 1430  levofloxacin (LEVAQUIN) IVPB 750 mg     750 mg 100 mL/hr over 90 Minutes Intravenous  Once 09/10/2016 1415 09/20/2016 2152   09/13/2016 1430  metroNIDAZOLE (FLAGYL) IVPB 500 mg     500 mg 100 mL/hr over 60 Minutes Intravenous  Once 09/14/2016 1415 09/11/2016 1850   09/15/2016 1430  levofloxacin (LEVAQUIN) IVPB 750 mg  Status:  Discontinued     750 mg 100 mL/hr over 90 Minutes Intravenous  Once 09/04/2016 1416 09/04/2016 1416      Assessment/Plan: Patient Active Problem List   Diagnosis Date Noted  . QT prolongation 10/06/2016  . Cardiac LV ejection fraction 30-35%   . DNR (do not resuscitate) discussion   . Congestive heart failure ([place])   . Heme positive stool   . Acute systolic CHF (congestive heart failure) ([place])   . Depression   . Candidemia ([place]) 09/21/2016  . Hypophosphatemia  09/21/2016  . Hypomagnesemia 09/21/2016  . Palliative care by specialist   . Adult failure to thrive   . Protein-calorie malnutrition, severe 09/12/2016  . Sepsis ([place]) 09/24/2016  . Hypotension 09/16/2016  . AKI (acute kidney injury) ([place]) 09/03/2016  . Dehydration 09/06/2016  . Generalized weakness 09/13/2016  . Anemia 09/20/2016  . Thrombocytopenia ([place]) 09/08/2016  . Abnormal computed tomography angiography (CTA) of abdomen and pelvis 09/25/2016  . Colitis: Probable 09/17/2016  . AAA (abdominal aortic aneurysm) ([place])   . Abdominal pain   . Ischemic enteritis ([place])   . Benign essential HTN   . Diabetes mellitus type 2 in obese ([place])   . Anxiety state   . Tachycardia   . Tachypnea   . Hyponatremia   . Leukocytosis   . Acute blood loss anemia   . 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 Unstable angina pectoris ([place])   . SOB (shortness of breath)   . 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 Change in stool 05/28/2016  . Gastroparesis 11/19/2011  . Nausea 11/19/2011  . Dysphagia 11/11/2011  . GERD (gastroesophageal reflux disease) 11/11/2011   SB protocol  No acute surgical needs  Cont NGT / NPO   LOS: 34 days    Haley Graham A. 10/13/2016

## 2016-10-13 NOTE — Progress Notes (Signed)
Pt's daughter arrived at bedside and requested that the MD page her to talk. MD has been notified. Pt's daughter denies other needs. Will continue current plan of care.  Grant Fontana BSN, RN

## 2016-10-14 ENCOUNTER — Inpatient Hospital Stay (HOSPITAL_COMMUNITY): Payer: BLUE CROSS/BLUE SHIELD

## 2016-10-14 DIAGNOSIS — K56609 Unspecified intestinal obstruction, unspecified as to partial versus complete obstruction: Secondary | ICD-10-CM

## 2016-10-14 DIAGNOSIS — I509 Heart failure, unspecified: Secondary | ICD-10-CM

## 2016-10-14 LAB — ECHOCARDIOGRAM LIMITED
Height: 63 in
WEIGHTICAEL: 2467.2 [oz_av]

## 2016-10-14 LAB — GLUCOSE, CAPILLARY
Glucose-Capillary: 118 mg/dL — ABNORMAL HIGH (ref 65–99)
Glucose-Capillary: 119 mg/dL — ABNORMAL HIGH (ref 65–99)
Glucose-Capillary: 106 mg/dL — ABNORMAL HIGH (ref 65–99)

## 2016-10-14 LAB — CBC
HEMATOCRIT: 31.9 % — AB (ref 36.0–46.0)
Hemoglobin: 10.5 g/dL — ABNORMAL LOW (ref 12.0–15.0)
MCH: 32.1 pg (ref 26.0–34.0)
MCHC: 32.9 g/dL (ref 30.0–36.0)
MCV: 97.6 fL (ref 78.0–100.0)
PLATELETS: 136 10*3/uL — AB (ref 150–400)
RBC: 3.27 MIL/uL — AB (ref 3.87–5.11)
RDW: 19.5 % — AB (ref 11.5–15.5)
WBC: 7.1 10*3/uL (ref 4.0–10.5)

## 2016-10-14 LAB — COMPREHENSIVE METABOLIC PANEL
ALT: 16 U/L (ref 14–54)
AST: 24 U/L (ref 15–41)
Albumin: 1.3 g/dL — ABNORMAL LOW (ref 3.5–5.0)
Alkaline Phosphatase: 305 U/L — ABNORMAL HIGH (ref 38–126)
Anion gap: 5 (ref 5–15)
BUN: 13 mg/dL (ref 6–20)
CHLORIDE: 113 mmol/L — AB (ref 101–111)
CO2: 22 mmol/L (ref 22–32)
Calcium: 6.9 mg/dL — ABNORMAL LOW (ref 8.9–10.3)
Creatinine, Ser: 0.81 mg/dL (ref 0.44–1.00)
GFR calc Af Amer: >60 mL/min (ref >60)
GFR calc non Af Amer: >60 mL/min (ref >60)
Glucose, Bld: 104 mg/dL — ABNORMAL HIGH (ref 65–99)
POTASSIUM: 3.9 mmol/L (ref 3.5–5.1)
SODIUM: 140 mmol/L (ref 135–145)
Total Bilirubin: 0.5 mg/dL (ref 0.3–1.2)
Total Protein: 4.8 g/dL — ABNORMAL LOW (ref 6.5–8.1)

## 2016-10-14 LAB — MAGNESIUM: MAGNESIUM: 2.3 mg/dL (ref 1.7–2.4)

## 2016-10-14 NOTE — Progress Notes (Signed)
OPAT ORDERS:  Diagnosis: candidemia   Culture Result: c albicans   Allergies  Allergen Reactions  . Penicillins Rash and Other (See Comments)    PATIENT HAS HAD A PCN REACTION WITH IMMEDIATE RASH, FACIAL/TONGUE/THROAT SWELLING, SOB, OR LIGHTHEADEDNESS WITH HYPOTENSION:  #  #  #  YES  #  #  #   Has patient had a PCN reaction causing severe rash involving mucus membranes or skin necrosis: no Has patient had a PCN reaction that required hospitalization no Has patient had a PCN reaction occurring within the last 10 years:no If all of the above answers are "NO", then may proceed with Cephalosporin use.  . Ativan [Lorazepam] Other (See Comments)    Makes pt very confused and more agitated, irritable.  . Codeine Nausea And Vomiting  . Reglan [Metoclopramide] Other (See Comments)    TACHYCARDIA    Discharge antibiotics: Per pharmacy protocol Anidulafungin 100 mg IV q24h   Duration: 6 weeks - previously on Fluconazole PO - finishing out course on IV   End Date: 10/28/16  [facility] Care and Maintenance Per Protocol _x_ Please pull PIC at completion of IV antibiotics __ Please leave PIC in place until doctor has seen patient or been notified  Labs weekly while on IV antibiotics: _x_ CBC with differential __ BMP __ BMP TWICE WEEKLY** _x_ CMP __ CRP __ ESR __ Vancomycin trough  Fax weekly labs to [phone]

## 2016-10-14 NOTE — Progress Notes (Signed)
   10/14/16 1500  Clinical Encounter Type  Visited With Patient;Other (Comment) (church friend)  Visit Type Initial;Spiritual support  Referral From Nurse  Consult/Referral To Chaplain  Spiritual Encounters  Spiritual Needs Prayer;Sacred text  Stress Factors  Patient Stress Factors None identified  Family Stress Factors None identified  Patient has been long term patient. Church member sitting with her. They seemed open to a visit from the chaplain for prayer and sharing of scripture. Conard Novak, Chaplain

## 2016-10-14 NOTE — Progress Notes (Signed)
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 She presented with weakness and met sepsis criteria initially with concern for enteritis.  She grew a yeast UTI and on 8/18 1/2 blood cultures grew yeast and the patient was started on eraxis.  She was switched to fluconazole and antibiotics were d/c'd on 8/20 with blood cultures only growing candida.  ID recommended continuing fluconazole 400 mg daily through 9/26 and recommended ophtho exam within the next month.     She had an echo on 8/28 which showed new systolic dysfunction and her QT was markedly prolonged so she was changed back from fluconazole to Eraxis until 9/26.  Cardiology was consulted and she was diuresed.   On 9/10 she developed new nausea and vomiting.  KUB showed concern for a SBO.  CT confirmed this and also showed evidence of possible enteritis.  Repeat blood cultures obtained and started on ceftriaxone/flagyl.   Given severe malnutrition will need to consider TPN for Mrs. Hoffmeister and won't be able to place PICC until blood Cx are Negative x 3 days per ID reccs.   On 9/12 ECHO was repeated and was essentially the same; Deferring to Cardiology for further invasive procedures such as angiography.    Assessment & Plan:   Principal Problem:   Candidemia ([place]) Active Problems:   GERD (gastroesophageal reflux disease)   Dissection of thoracoabdominal aorta (HCC)   AAA (abdominal aortic aneurysm) (HCC)   Benign essential HTN   Diabetes  mellitus type 2 in obese (HCC)   Anxiety state   Hyponatremia   Leukocytosis   Sepsis (HCC)   Hypotension   AKI (acute kidney injury) ([place])   Dehydration   Generalized weakness   Anemia   Thrombocytopenia (HCC)   Abnormal computed tomography angiography (CTA) of abdomen and pelvis   Colitis: Probable   Protein-calorie malnutrition, severe   Palliative care by specialist   Adult failure to thrive   Hypophosphatemia   Hypomagnesemia   Depression   Acute systolic CHF (congestive heart failure) ([place])   DNR (do not resuscitate) discussion   Congestive heart failure (HCC)   Heme positive stool   Cardiac LV ejection fraction 30-35%   QT prolongation  Small Bowel Obstruction  Enteritis?:   - Surgery following, SB, protocol, KUB this morning showed no convincing radiographic improvement in apparent mid/distal small bowel obstruction. Nasogastric tube traverses decompressed stomach. -C/w ceftriaxone/flagyl for evidence of ? enteritis, although with her history, may be related to mesenteric ischemia. repeat blood cx as well due to tachy, but normal WBC, without fevers. Low Threshold to de-escalate  - d5 NS at 75 mL/hr ordered x 3 days, continue to watch volume status with HF   Sepsis due to Candidemia: - discussed with ID, ID to see, switched to eraxis (caspofungin not on formulary) after discussion with ID -> continue until 9/26  -Case was discussed with Dr. Valetta Close 9/7 who recommended follow up after discharge for ophthalmologic exam -Some concern for thrush, ctm on eraxis, she's been on fluconazole  -Per ID continue Anidulafungin for 6 weeks; PICC to be placed after recent cultures 9/11 NG  x at least 72h. Will need double lumen for TNA + prolonged antifungal course  Splenic Infarcts:   -Unclear cause, new since previous study per rads.   -Possibly related to thrombosis or dissection within aorta, though thrombosis is infrarenal and celiac comes off true lumen.    Acute systolic  congestive heart failure:  - Holding lasix 20 mg PO daily, with tachycardia/N/V with above SBO -Repeat echo with EF 35-40% and grade 2 diastolic dysfunction as well as hypokinesis of mid-apicalanteroseptal, anterior, and inferolateral myocardium (mildly improved from 8/28 echo at 30-35%) -Follow up cardiology recs Cardiology rec f/u in clinic in 4-6 weeks with repeat echo -> Cards repeated ECHO today which was essentially unchanged -May needLexiscan Myoview versus cath but will defer to Cardiology.Denied shortness of breath or chest pain today. Continue to monitor urine output and electrolytes. Cardiology consult appreciated. -On beta blocker, consider ace/arb in future -Cardiology would potentially consider invasive angiography   Prolonged QTC: Over 600 initially and improved.   -D/c fluconazole.  D/c tramadol and hold cymbalta.  Zofran discontinued.  Discontinue Azithromycin and abilify.  Monitor in telemetry. Repeat EKG. _0  Magnesium, repleting  Tachycardia:  -likely 2/2 SBO/N/V above.  Sinus tach on EKG.  Will continue to monitor. Gentle IVF.  Bolus 250 cc and repeat prn if tolerated well.   Severe protein calorie malnutrition:  -Nutritionist Consulted -Cortrak dc'd.  Will need to consider TPN again.  Discussed this with husband and cannot place PICC for TPN until Blood Cx Negative at least 72 hours  Acute kidney Injury  -Improved -Continue to Monitor CMP's  Acute on Chronic anemia likely due to chronic disease vs acute GI bleed:  -Iron stores acceptable. Fecal occult blood test positive. On Protonix. Status post 2 unit of red blood cell transfusion on 8/30 with improvement in hemoglobin. Evaluated by GI. No further endoscopy at this time. -Hemoglobin level stable. -Monitor CBC.  Thrombocytopenia likely due to acute illness:  -Monitor CBC. Platelet count up and down over past few days.  -Continue to Monitor; Platelet Count was 136  Depression and Anxiety:  -Evaluated by  psychiatrist.   -Continue Xanax, holding cymbalta, andVyvanse per psychiatrist  Hypokalemia/Hypomagnesemia:  -Repleted magnesium and potassium chloride. Monitor labs.  Loose stools:  -Negative c. Diff; Possibly from SBO  Delirium:  -C/w Delirium precautions -Continue to Redirect   Goals of care:  -Palliative follow up with patient/family today. Had a discussion with Husband about Mrs. Headlee today.  Discussed that she was extremely sick and that it is hard to predict when these new complications would resolve (both asked about when she might be able to get to the SNF). -Discussed need for TPN again as well.    DVT prophylaxis: SCDs Code Status: FULL CODE Family Communication: Discussed with Husband over the phone Disposition Plan: SNF when medically Stable   Consultants:   Cardiology  Psychiatry  Infectious Diseases  Palliative Care Medicine  Gastroenterology  General Surgery  Nutrition  Discussed Case with Vascular    Procedures:   Thoracentesis 8/17  Echo 8/28 EF 30-35%, akinesis of mid-apicalanteroseptal and anterior myocardium. Diastolic dysfunction.  Echo 8/13 EF 30-09, diastolic dysfunction, lipomatous hypertrophy  Echo 9/4 EF 35-40%, hypokinesis of mid apicalanteroseptal, anterior, and inferolateral myocardium, grade 2 diastolic dysfunction -Echo 9/12 There was mild concentric hypertrophy. Systolic function was moderately reduced. The estimated ejection fraction was in the range of 35% to 40%. There is akinesis of the mid-apicalanteroseptal, anterior,   anterolateral, lateral, and apical myocardium. The study  is not technically sufficient to allow evaluation of LV diastolic   Antimicrobials:  Anti-infectives    Start     Dose/Rate Route Frequency Ordered Stop   10/14/16 1500  anidulafungin (ERAXIS) 100 mg in sodium chloride 0.9 % 100 mL IVPB     100 mg 78 mL/hr over 100 Minutes Intravenous Every 24 hours 10/13/16 1018 11/25/16 1459   10/13/16  1100  cefTRIAXone (ROCEPHIN) 2 g in dextrose 5 % 50 mL IVPB - Premix     2 g 100 mL/hr over 30 Minutes Intravenous Every 24 hours 10/13/16 1016     10/13/16 1100  metroNIDAZOLE (FLAGYL) IVPB 500 mg     500 mg 100 mL/hr over 60 Minutes Intravenous Every 8 hours 10/13/16 1016     10/13/16 1030  anidulafungin (ERAXIS) 200 mg in sodium chloride 0.9 % 200 mL IVPB     200 mg 78 mL/hr over 200 Minutes Intravenous  Once 10/13/16 1018 10/13/16 2052   09/27/16 2200  erythromycin 250 mg in sodium chloride 0.9 % 100 mL IVPB  Status:  Discontinued     250 mg 100 mL/hr over 60 Minutes Intravenous Every 8 hours 09/27/16 1842 09/27/16 1959   09/27/16 2200  erythromycin (EES) 400 MG/5ML suspension 250 mg  Status:  Discontinued     250 mg Oral Every 8 hours 09/27/16 1959 09/29/16 1147   09/24/16 1300  fluconazole (DIFLUCAN) tablet 400 mg  Status:  Discontinued     400 mg Oral Daily 09/24/16 1138 10/13/16 0953   09/21/16 1400  fluconazole (DIFLUCAN) IVPB 400 mg  Status:  Discontinued     400 mg 100 mL/hr over 120 Minutes Intravenous Every 24 hours 09/20/16 1220 09/20/16 1221   09/21/16 0000  anidulafungin (ERAXIS) 50 mg in sodium chloride 0.9 % 50 mL IVPB  Status:  Discontinued     50 mg 78 mL/hr over 50 Minutes Intravenous Every 24 hours 09/19/16 0345 09/19/16 0346   09/20/16 1400  fluconazole (DIFLUCAN) IVPB 800 mg  Status:  Discontinued     800 mg 200 mL/hr over 120 Minutes Intravenous  Once 09/20/16 1220 09/20/16 1221   09/20/16 1400  fluconazole (DIFLUCAN) IVPB 400 mg  Status:  Discontinued     400 mg 100 mL/hr over 120 Minutes Intravenous Every 24 hours 09/20/16 1221 09/24/16 1137   09/19/16 2200  anidulafungin (ERAXIS) 100 mg in sodium chloride 0.9 % 100 mL IVPB  Status:  Discontinued     100 mg 78 mL/hr over 100 Minutes Intravenous Every 24 hours 09/19/16 0345 09/20/16 1220   09/19/16 0400  anidulafungin (ERAXIS) 200 mg in sodium chloride 0.9 % 200 mL IVPB     200 mg 78 mL/hr over 200 Minutes  Intravenous  Once 09/19/16 0345 09/19/16 0819   09/18/16 1800  fluconazole (DIFLUCAN) IVPB 100 mg  Status:  Discontinued     100 mg 50 mL/hr over 60 Minutes Intravenous Every 24 hours 09/18/16 1109 09/19/16 1937   09/17/16 1800  ceFEPIme (MAXIPIME) 2 g in dextrose 5 % 50 mL IVPB  Status:  Discontinued     2 g 100 mL/hr over 30 Minutes Intravenous Every 24 hours 09/17/16 1243 09/21/16 1200   09/17/16 1800  fluconazole (DIFLUCAN) IVPB 200 mg  Status:  Discontinued     200 mg 100 mL/hr over 60 Minutes Intravenous Every 24 hours 09/17/16 1651 09/18/16 1109   09/12/16 1400  ceFEPIme (MAXIPIME) 1 g in dextrose 5 % 50 mL IVPB  Status:  Discontinued  1 g 100 mL/hr over 30 Minutes Intravenous Every 8 hours 09/12/16 1127 09/17/16 1243   09/11/16 1600  metroNIDAZOLE (FLAGYL) IVPB 500 mg  Status:  Discontinued     500 mg 100 mL/hr over 60 Minutes Intravenous Every 8 hours 09/11/16 0902 09/21/16 1200   09/11/16 1400  ceFEPIme (MAXIPIME) 1 g in dextrose 5 % 50 mL IVPB  Status:  Discontinued     1 g 100 mL/hr over 30 Minutes Intravenous Every 24 hours 09/11/16 1234 09/12/16 1127   09/10/16 2000  vancomycin (VANCOCIN) IVPB 1000 mg/200 mL premix  Status:  Discontinued     1,000 mg 200 mL/hr over 60 Minutes Intravenous Every 24 hours 09/02/2016 2130 09/10/16 1616   09/10/16 1800  vancomycin (VANCOCIN) IVPB 750 mg/150 ml premix  Status:  Discontinued     750 mg 150 mL/hr over 60 Minutes Intravenous Every 12 hours 09/10/16 1616 09/12/16 1145   09/10/16 1400  levofloxacin (LEVAQUIN) IVPB 500 mg  Status:  Discontinued     500 mg 100 mL/hr over 60 Minutes Intravenous Every 24 hours 09/04/2016 2112 09/11/16 1220   09/10/16 0115  vancomycin (VANCOCIN) IVPB 1000 mg/200 mL premix  Status:  Discontinued     1,000 mg 200 mL/hr over 60 Minutes Intravenous  Once 09/10/16 0110 09/10/16 0118   09/11/2016 2200  metroNIDAZOLE (FLAGYL) IVPB 500 mg  Status:  Discontinued     500 mg 100 mL/hr over 60 Minutes Intravenous  Every 6 hours 09/24/2016 2110 09/11/16 0902   09/07/2016 2115  vancomycin (VANCOCIN) IVPB 1000 mg/200 mL premix     1,000 mg 200 mL/hr over 60 Minutes Intravenous  Once 09/12/2016 2108 09/14/2016 2323   09/04/2016 1430  levofloxacin (LEVAQUIN) IVPB 750 mg     750 mg 100 mL/hr over 90 Minutes Intravenous  Once 09/30/2016 1415 09/13/2016 2152   09/10/2016 1430  metroNIDAZOLE (FLAGYL) IVPB 500 mg     500 mg 100 mL/hr over 60 Minutes Intravenous  Once 09/07/2016 1415 09/16/2016 1850   09/10/2016 1430  levofloxacin (LEVAQUIN) IVPB 750 mg  Status:  Discontinued     750 mg 100 mL/hr over 90 Minutes Intravenous  Once 09/18/2016 1416 09/22/2016 1416     Subjective: Seen and examined at bedside and was talking on the phone. Stated she was not confused but friend at beside stated she was . Had a liquidy bowel movement this AM. No Nausea or vomiting and NG in place.   Objective: Vitals:   10/14/16 1139 10/14/16 1150 10/14/16 1300 10/14/16 1800  BP:  132/68  131/70  Pulse:      Resp: 20  (!) 22   Temp:      TempSrc:      SpO2: 100%     Weight:      Height:        Intake/Output Summary (Last 24 hours) at 10/14/16 1856 Last data filed at 10/14/16 1500  Gross per 24 hour  Intake           1742.5 ml  Output             1210 ml  Net            532.5 ml   Filed Weights   10/12/16 0400 10/13/16 0400 10/14/16 0400  Weight: 69.9 kg (154 lb) 69.4 kg (153 lb 1.6 oz) 69.9 kg (154 lb 3.2 oz)   Examination: Physical Exam:  Constitutional: WN/WD Caucasian female in NAD and appears calm and comfortable Eyes: Lids and  conjunctivae normal, sclerae anicteric  ENMT: External Ears, Nose appear normal. Grossly normal hearing. Mucous membranes are moist. Patient has NG in Right Nare. Neck: Appears normal, supple, no cervical masses, normal ROM, no appreciable thyromegaly Respiratory: Diminished to auscultation bilaterally, no wheezing, rales, rhonchi or crackles. Normal respiratory effort and patient is not tachypenic. No  accessory muscle use.  Cardiovascular: RRR, no murmurs / rubs / gallops. S1 and S2 auscultated. Trace extremity edema.  Abdomen: Soft, Tender to palpate, non-distended. No masses palpated. No appreciable hepatosplenomegaly. Bowel sounds hypoactive.  GU: Deferred. Musculoskeletal: No clubbing / cyanosis of digits/nails. No joint deformity upper and lower extremities. Good ROM, no contractures. Normal strength and muscle tone.  Skin: No rashes, lesions, ulcers on a limited skin eval. No induration; Warm and dry.  Neurologic: CN 2-12 grossly intact with no focal deficits. Romberg sign cerebellar reflexes not assessed.  Psychiatric: Impaired judgment and insight. Alert and but not oriented x 3. Normal mood and appropriate affect.   Data Reviewed: I have personally reviewed following labs and imaging studies  CBC:  Recent Labs Lab 10/10/16 0343 10/11/16 0629 10/12/16 1647 10/13/16 0337 10/14/16 0339  WBC 6.3 6.4 12.5* 8.3 7.1  HGB 9.4* 9.8* 10.0* 9.2* 10.5*  HCT 30.0* 31.1* 30.8* 27.5* 31.9*  MCV 98.4 98.7 95.4 95.5 97.6  PLT 55* 68* 109* 101* 284*   Basic Metabolic Panel:  Recent Labs Lab 10/10/16 0343 10/10/16 1405 10/11/16 0629 10/12/16 1439 10/13/16 0337 10/14/16 0339  NA 139  --  137 135 136 140  K 4.0  --  4.4 5.0 4.5 3.9  CL 108  --  105 104 109 113*  CO2 25  --  25 22 20* 22  GLUCOSE 126*  --  124* 122* 91 104*  BUN 19  --  _0 CREATININE 0.76  --  0.70 0.76 0.72 0.81  CALCIUM 7.2*  --  7.1* 7.5* 7.0* 6.9*  MG  --  1.4* 2.2 1.7 1.6* 2.3   GFR: Estimated Creatinine Clearance: 69.3 mL/min (by C-G formula based on SCr of 0.81 mg/dL). Liver Function Tests:  Recent Labs Lab 10/09/16 0318 10/10/16 0343 10/11/16 0629 10/13/16 0337 10/14/16 0339  AST _1 ALT _2 ALKPHOS 308* 309* 318* 302* 305*  BILITOT 0.7 0.5 0.5 0.6 0.5  PROT 4.1* 4.2* 4.4* 4.4* 4.8*  ALBUMIN 1.1* 1.1* 1.1* 1.1* 1.3*   No results for input(s): LIPASE,  AMYLASE in the last 168 hours. No results for input(s): AMMONIA in the last 168 hours. Coagulation Profile: No results for input(s): INR, PROTIME in the last 168 hours. Cardiac Enzymes: No results for input(s): CKTOTAL, CKMB, CKMBINDEX, TROPONINI in the last 168 hours. BNP (last 3 results) No results for input(s): PROBNP in the last 8760 hours. HbA1C: No results for input(s): HGBA1C in the last 72 hours. CBG:  Recent Labs Lab 10/13/16 1148 10/13/16 1808 10/13/16 2109 10/14/16 0602 10/14/16 1623  GLUCAP 90 93 93 118* 119*   Lipid Profile: No results for input(s): CHOL, HDL, LDLCALC, TRIG, CHOLHDL, LDLDIRECT in the last 72 hours. Thyroid Function Tests: No results for input(s): TSH, T4TOTAL, FREET4, T3FREE, THYROIDAB in the last 72 hours. Anemia Panel: No results for input(s): VITAMINB12, FOLATE, FERRITIN, TIBC, IRON, RETICCTPCT in the last 72 hours. Sepsis Labs: No results for input(s): PROCALCITON, LATICACIDVEN in the last 168 hours.  Recent Results (from the past 240 hour(s))  C difficile quick screen w PCR reflex  Status: None   Collection Time: 10/07/16 11:58 AM  Result Value Ref Range Status   C Diff antigen NEGATIVE NEGATIVE Final   C Diff toxin NEGATIVE NEGATIVE Final   C Diff interpretation No C. difficile detected.  Final  Culture, blood (routine x 2)     Status: None (Preliminary result)   Collection Time: 10/13/16  6:58 PM  Result Value Ref Range Status   Specimen Description BLOOD LEFT HAND  Final   Special Requests IN PEDIATRIC BOTTLE Blood Culture adequate volume  Final   Culture NO GROWTH < 24 HOURS  Final   Report Status PENDING  Incomplete  Culture, blood (routine x 2)     Status: None (Preliminary result)   Collection Time: 10/13/16  7:21 PM  Result Value Ref Range Status   Specimen Description BLOOD RIGHT HAND  Final   Special Requests IN PEDIATRIC BOTTLE Blood Culture adequate volume  Final   Culture NO GROWTH < 24 HOURS  Final   Report Status  PENDING  Incomplete   Radiology Studies: Ct Abdomen Pelvis W Contrast  Result Date: 10/13/2016 CLINICAL DATA:  Abdominal pain, nausea, and vomiting. History of gastroesophageal reflux disease, gastroparesis, diabetes. EXAM: CT ABDOMEN AND PELVIS WITH CONTRAST TECHNIQUE: Multidetector CT imaging of the abdomen and pelvis was performed using the standard protocol following bolus administration of intravenous contrast. CONTRAST:  135m ISOVUE-300 IOPAMIDOL (ISOVUE-300) INJECTION 61% COMPARISON:  09/17/2016, 07/18/2016 FINDINGS: Lower chest: Small to moderate bilateral pleural effusions with basilar atelectasis or consolidation bilaterally, greater on the left. Dissection of the descending thoracic aorta with stent in the true lumen. Hepatobiliary: Mild diffuse fatty infiltration of the liver. No focal lesions identified. Surgical absence of the gallbladder. No bile duct dilatation. Pancreas: Unremarkable. No pancreatic ductal dilatation or surrounding inflammatory changes. Spleen: Wedge-shaped filling defects in the inferior spleen likely to represent splenic infarcts. Adrenals/Urinary Tract: 2.5 cm diameter heterogeneously enhancing adrenal gland nodule. No significant change since previous study. No other right adrenal gland nodules. Diffuse parenchymal atrophy and scarring of the left kidney. Heterogeneous nephrogram on the left may be due to infarcts or pyelonephritis. Right kidney is unremarkable. No hydronephrosis or hydroureter. The bladder is decompressed with a Foley catheter. Gas in the bladder is likely resulting from catheter insertion. Stomach/Bowel: There is an enteric tube with tip in the proximal duodenum. Stomach is mostly decompressed in the gastric wall is not significantly thickened. Small bowel are diffusely dilated and fluid-filled with thickening of the small bowel wall. The terminal ileum is decompressed with transition zone in the right lower quadrant. This suggests distal small bowel  obstruction or partial obstruction. Wall thickening may be due to enteritis or inflammatory change. Ischemia not entirely excluded but there is no evidence of pneumatosis or portal venous gas. Visualized mesenteric artery and vein appear patent. Colon is decompressed. Appendix is not seen. Vascular/Lymphatic: Dissection of the abdominal aorta extending from the descending aorta through to the bifurcation. No aneurysm. There is thrombosis of the true lumen beginning at about the level of the renal arteries. Left renal artery arises from the true lumen and appears compromised with probable thrombosis of the left renal artery. Right renal artery arises from the false lumen and is patent. Celiac axis and inferior mesenteric artery arise from the true lumen and are patent. Inferior mesenteric artery appears occluded. Dissection extends into the iliac arteries bilaterally with thrombosis of the true lumen. False lumen is patent. External and internal iliac arteries appear patent. Vascular calcifications are present. Reproductive:  Status post hysterectomy. No adnexal masses. Other: There is a small amount of free fluid in the pelvis, mesenteric, pericolic gutters, and around the liver and spleen. This is likely ascites. Edema in the subcutaneous fat of the abdominal wall. No free air in the abdomen. Loculated fluid collection in the right groin region could represent a hematoma if there is been instrumentation in the groin. This was present previously. Fatty atrophy of the musculature. Musculoskeletal: No destructive bone lesions. IMPRESSION: 1. 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 False lumen is patent. This is unchanged since 07/18/2016 study 2. 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 Appearance is similar to previous study. 3. 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  Appear new since previous study. 4. Mild diffuse abdominal and pelvic ascites. 5. Early or  partial small bowel obstruction with transition zone in the right lower quadrant. Small bowel wall thickening is likely due to enteritis or inflammatory change although ischemia not entirely excluded. No pneumatosis. Similar appearance on prior studies although there has been some progression in the interval. 6. The left adrenal gland nodule is unchanged. 7. Diffuse fatty infiltration of the liver. 8. Bilateral pleural effusions with basilar atelectasis or consolidation. These results were called by telephone at the time of interpretation on 10/13/2016 at 3:30 am to Dr. Chaney Malling , who verbally acknowledged these results. Electronically Signed   By: Lucienne Capers M.D.   On: 10/13/2016 03:30   Dg Abd Portable 1v  Result Date: 10/14/2016 CLINICAL DATA:  Small bowel obstruction EXAM: PORTABLE ABDOMEN - 1 VIEW COMPARISON:  10/13/2016 FINDINGS: Nasogastric tube extends to the proximal duodenum. The stomach is decompressed. Multiple dilated small bowel loops processed in the left upper mid abdomen, with little change in degree of dilatation in number of loops. The colon remains decompressed with residual contrast material in the distal sigmoid and rectum. Surgical clips right upper abdomen and overlying right femoral head. IMPRESSION: 1. No convincing radiographic improvement in apparent mid/distal small bowel obstruction. 2. Nasogastric tube traverses decompressed stomach. Electronically Signed   By: Lucrezia Europe M.D.   On: 10/14/2016 08:12   Dg Abd Portable 1v-small Bowel Obstruction Protocol-initial, 8 Hr Delay  Result Date: 10/13/2016 CLINICAL DATA:  Small bowel obstruction. EXAM: PORTABLE ABDOMEN - 1 VIEW COMPARISON:  CT abdomen and pelvis and single view of the abdomen 10/13/2016. FINDINGS: NG tube is unchanged. There is persistent gaseous distention of small bowel with loops up to 4.4 cm identified, not notably changed. IMPRESSION: Unchanged gaseous distention of small bowel consistent small-bowel  obstruction. Electronically Signed   By: Inge Rise M.D.   On: 10/13/2016 20:12   Dg Abd Portable 1v-small Bowel Protocol-position Verification  Result Date: 10/13/2016 CLINICAL DATA:  NG tube placement EXAM: PORTABLE ABDOMEN - 1 VIEW COMPARISON:  CT abdomen and pelvis 10/13/2016 FINDINGS: An enteric tube has been placed with tip in the right upper quadrant consistent with location in the distal stomach or possibly proximal duodenum. Gas-filled dilated small bowel consistent with small bowel obstruction. Small bowel wall thickening may indicate edema or inflammatory change. Residual contrast material in the renal collecting system. Surgical clips in the right upper quadrant. IMPRESSION: Enteric tube tip is in the right upper quadrant consistent with location in the distal stomach. Gaseous distention of small bowel with wall thickening suggesting obstruction and enteritis. Electronically Signed   By: Lucienne Capers M.D.   On: 10/13/2016 05:42   Scheduled Meds: . ALPRAZolam  0.25 mg Oral  QHS  . aspirin EC  81 mg Oral Daily  . dronabinol  5 mg Oral BID AC  . feeding supplement (ENSURE ENLIVE)  237 mL Oral TID BM  . insulin aspart  0-5 Units Subcutaneous QHS  . insulin aspart  0-9 Units Subcutaneous TID WC  . lisdexamfetamine  20 mg Oral Daily  . metoprolol tartrate  5 mg Intravenous Q6H  . scopolamine  1 patch Transdermal Q72H  . sodium chloride flush  3 mL Intravenous Q12H   Continuous Infusions: . anidulafungin Stopped (10/14/16 1724)  . cefTRIAXone (ROCEPHIN)  IV Stopped (10/14/16 1440)  . dextrose 5 % and 0.9% NaCl 75 mL/hr at 10/14/16 0836  . famotidine (PEPCID) IV Stopped (10/14/16 1016)  . metronidazole 500 mg (10/14/16 1810)     LOS: 10 days   Kerney Elbe, DO [facility] Pager [phone]  If 7PM-7AM, please contact night-coverage [email or web address] Password [facility] 10/14/2016, 6:56 PM

## 2016-10-14 NOTE — Progress Notes (Signed)
Nutrition Follow-up  DOCUMENTATION CODES:   Severe malnutrition in context of chronic illness  INTERVENTION:    Advance PO diet and resume nutrition support (EN vs TPN) as medically appropriate   RD to follow for nutrition care plan  NUTRITION DIAGNOSIS:   Malnutrition (severe) related to chronic illness (failure to thrive post repair aortic dissection) as evidenced by energy intake < or equal to 50% for > or equal to 1 month, percent weight loss, ongoing  GOAL:   Patient will meet greater than or equal to 90% of their needs, currently unmet  MONITOR:   Diet advancement, PO intake, Labs, Weight trends, Skin, I & O's  ASSESSMENT:    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 She presented with weakness and met sepsis criteria with concern for enteritis. Antibiotics started. Cultures pending.  Pt developed abdominal pain, nausea and vomiting 9/10. CT scan showed small bowel obstruction with thickened bowel wall. TF (Vital 1.5) discontinued. Cortrak feeding tube removed.  Pt now NPO with NGT in place to LIS. Labs reviewed. CBG's [phone]. + liquid BM this AM.  Diet Order:  Diet NPO time specified  Skin:  Reviewed, no issues (no pressure ulcers)  Last BM:  9/12  Height:   Ht Readings from Last 1 Encounters:  10/06/16 '5\' 3"'  (1.6 m)   Weight:   Wt Readings from Last 1 Encounters:  10/14/16 154 lb 3.2 oz (69.9 kg)   9/11  153 lb 9/10  154 lb 9/09  152 lb 9/08  153 lb 9/07  149 lb 9/02  160 lb 8/31  150 lb 8/30  152 lb 8/29  154 lb 8/28  153 lb  Ideal Body Weight:  52.2 kg  BMI:  Body mass index is 27.32 kg/m.  Estimated Nutritional Needs:    Kcal:  2000-2200  Protein:  110-120 gm  Fluid:  2.0-2.2 L  EDUCATION NEEDS:   No education needs identified at this time  Arthur Holms, RD, LDN Pager #: [phone] After-Hours Pager #: [phone]

## 2016-10-14 NOTE — Progress Notes (Signed)
Patient ID: Haley Graham, female   DOB: 1955-04-30, 61 y.o.   MRN: [id number]  [facility] Surgery Progress Note     Subjective: CC- SBO Patient states that she wants watermelon. Denies any current abdominal pain. Feels like distension has improved. States that she passed flatus yesterday, none today. Denies n/v. Had a liquid BM this AM.  Objective: Vital signs in last 24 hours: Temp:  [97.9 F (36.6 C)] 97.9 F (36.6 C) (09/12 0400) Pulse Rate:  [121-129] 129 (09/11 1223) Resp:  [16-25] 22 (09/12 0425) BP: (102-131)/(54-82) 131/54 (09/12 0425) SpO2:  [93 %-98 %] 93 % (09/12 0000) Weight:  [154 lb 3.2 oz (69.9 kg)] 154 lb 3.2 oz (69.9 kg) (09/12 0400) Last BM Date: 10/12/16  Intake/Output from previous day: 09/11 0701 - 09/12 0700 In: 2363.8 [I.V.:1423.8; NG/GT:640; IV Piggyback:300] Out: 2110 [Urine:600; Emesis/NG output:1510] Intake/Output this shift: No intake/output data recorded.  PE: Gen:  Alert, NAD, pleasant HEENT: EOM's intact, pupils equal and round Card:  RRR, no M/G/R heard Pulm:  CTAB, no W/R/R, effort normal Abd: Soft, mild distension, hypoactive BS, no HSM, no hernia, mild global tenderness Psych: A&Ox3  Skin: no rashes noted, warm and dry  Lab Results:   Recent Labs  10/13/16 0337 10/14/16 0339  WBC 8.3 7.1  HGB 9.2* 10.5*  HCT 27.5* 31.9*  PLT 101* 136*   BMET  Recent Labs  10/13/16 0337 10/14/16 0339  NA 136 140  K 4.5 3.9  CL 109 113*  CO2 20* 22  GLUCOSE 91 104*  BUN 16 13  CREATININE 0.72 0.81  CALCIUM 7.0* 6.9*   PT/INR No results for input(s): LABPROT, INR in the last 72 hours. CMP     Component Value Date/Time   NA 140 10/14/2016 0339   K 3.9 10/14/2016 0339   CL 113 (H) 10/14/2016 0339   CO2 22 10/14/2016 0339   GLUCOSE 104 (H) 10/14/2016 0339   BUN 13 10/14/2016 0339   CREATININE 0.81 10/14/2016 0339   CALCIUM 6.9 (L) 10/14/2016 0339   PROT 4.8 (L) 10/14/2016 0339   ALBUMIN 1.3 (L) 10/14/2016 0339   AST 24  10/14/2016 0339   ALT 16 10/14/2016 0339   ALKPHOS 305 (H) 10/14/2016 0339   BILITOT 0.5 10/14/2016 0339   GFRNONAA >60 10/14/2016 0339   GFRAA >60 10/14/2016 0339   Lipase     Component Value Date/Time   LIPASE 14 10/01/2016 1139       Studies/Results: Dg Abd 1 View  Result Date: 10/12/2016 CLINICAL DATA:  Vomiting and low mid abdominal pain since this morning. EXAM: ABDOMEN - 1 VIEW COMPARISON:  09/23/2016. FINDINGS: Feeding tube terminates in the descending duodenum. There is marked gaseous distension of small bowel. Minimal colonic gas with gas seen in the rectum. IMPRESSION: Bowel-gas pattern is indicative of a small bowel obstruction. Electronically Signed   By: Lorin Picket M.D.   On: 10/12/2016 16:04   Ct Abdomen Pelvis W Contrast  Result Date: 10/13/2016 CLINICAL DATA:  Abdominal pain, nausea, and vomiting. History of gastroesophageal reflux disease, gastroparesis, diabetes. EXAM: CT ABDOMEN AND PELVIS WITH CONTRAST TECHNIQUE: Multidetector CT imaging of the abdomen and pelvis was performed using the standard protocol following bolus administration of intravenous contrast. CONTRAST:  125mL ISOVUE-300 IOPAMIDOL (ISOVUE-300) INJECTION 61% COMPARISON:  09/17/2016, 07/18/2016 FINDINGS: Lower chest: Small to moderate bilateral pleural effusions with basilar atelectasis or consolidation bilaterally, greater on the left. Dissection of the descending thoracic aorta with stent in the true lumen. Hepatobiliary:  Mild diffuse fatty infiltration of the liver. No focal lesions identified. Surgical absence of the gallbladder. No bile duct dilatation. Pancreas: Unremarkable. No pancreatic ductal dilatation or surrounding inflammatory changes. Spleen: Wedge-shaped filling defects in the inferior spleen likely to represent splenic infarcts. Adrenals/Urinary Tract: 2.5 cm diameter heterogeneously enhancing adrenal gland nodule. No significant change since previous study. No other right adrenal gland  nodules. Diffuse parenchymal atrophy and scarring of the left kidney. Heterogeneous nephrogram on the left may be due to infarcts or pyelonephritis. Right kidney is unremarkable. No hydronephrosis or hydroureter. The bladder is decompressed with a Foley catheter. Gas in the bladder is likely resulting from catheter insertion. Stomach/Bowel: There is an enteric tube with tip in the proximal duodenum. Stomach is mostly decompressed in the gastric wall is not significantly thickened. Small bowel are diffusely dilated and fluid-filled with thickening of the small bowel wall. The terminal ileum is decompressed with transition zone in the right lower quadrant. This suggests distal small bowel obstruction or partial obstruction. Wall thickening may be due to enteritis or inflammatory change. Ischemia not entirely excluded but there is no evidence of pneumatosis or portal venous gas. Visualized mesenteric artery and vein appear patent. Colon is decompressed. Appendix is not seen. Vascular/Lymphatic: Dissection of the abdominal aorta extending from the descending aorta through to the bifurcation. No aneurysm. There is thrombosis of the true lumen beginning at about the level of the renal arteries. Left renal artery arises from the true lumen and appears compromised with probable thrombosis of the left renal artery. Right renal artery arises from the false lumen and is patent. Celiac axis and inferior mesenteric artery arise from the true lumen and are patent. Inferior mesenteric artery appears occluded. Dissection extends into the iliac arteries bilaterally with thrombosis of the true lumen. False lumen is patent. External and internal iliac arteries appear patent. Vascular calcifications are present. Reproductive: Status post hysterectomy. No adnexal masses. Other: There is a small amount of free fluid in the pelvis, mesenteric, pericolic gutters, and around the liver and spleen. This is likely ascites. Edema in the  subcutaneous fat of the abdominal wall. No free air in the abdomen. Loculated fluid collection in the right groin region could represent a hematoma if there is been instrumentation in the groin. This was present previously. Fatty atrophy of the musculature. Musculoskeletal: No destructive bone lesions. IMPRESSION: 1. 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 False lumen is patent. This is unchanged since 07/18/2016 study 2. 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 Appearance is similar to previous study. 3. 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  Appear new since previous study. 4. Mild diffuse abdominal and pelvic ascites. 5. Early or partial small bowel obstruction with transition zone in the right lower quadrant. Small bowel wall thickening is likely due to enteritis or inflammatory change although ischemia not entirely excluded. No pneumatosis. Similar appearance on prior studies although there has been some progression in the interval. 6. The left adrenal gland nodule is unchanged. 7. Diffuse fatty infiltration of the liver. 8. Bilateral pleural effusions with basilar atelectasis or consolidation. These results were called by telephone at the time of interpretation on 10/13/2016 at 3:30 am to Dr. Chaney Malling , who verbally acknowledged these results. Electronically Signed   By: Lucienne Capers M.D.   On: 10/13/2016 03:30   Dg Abd Portable 1v-small Bowel Obstruction Protocol-initial, 8 Hr Delay  Result Date: 10/13/2016 CLINICAL DATA:  Small bowel obstruction. EXAM: PORTABLE ABDOMEN -  1 VIEW COMPARISON:  CT abdomen and pelvis and single view of the abdomen 10/13/2016. FINDINGS: NG tube is unchanged. There is persistent gaseous distention of small bowel with loops up to 4.4 cm identified, not notably changed. IMPRESSION: Unchanged gaseous distention of small bowel consistent small-bowel obstruction. Electronically Signed   By: Inge Rise  M.D.   On: 10/13/2016 20:12   Dg Abd Portable 1v-small Bowel Protocol-position Verification  Result Date: 10/13/2016 CLINICAL DATA:  NG tube placement EXAM: PORTABLE ABDOMEN - 1 VIEW COMPARISON:  CT abdomen and pelvis 10/13/2016 FINDINGS: An enteric tube has been placed with tip in the right upper quadrant consistent with location in the distal stomach or possibly proximal duodenum. Gas-filled dilated small bowel consistent with small bowel obstruction. Small bowel wall thickening may indicate edema or inflammatory change. Residual contrast material in the renal collecting system. Surgical clips in the right upper quadrant. IMPRESSION: Enteric tube tip is in the right upper quadrant consistent with location in the distal stomach. Gaseous distention of small bowel with wall thickening suggesting obstruction and enteritis. Electronically Signed   By: Lucienne Capers M.D.   On: 10/13/2016 05:42    Anti-infectives: Anti-infectives    Start     Dose/Rate Route Frequency Ordered Stop   10/14/16 1500  anidulafungin (ERAXIS) 100 mg in sodium chloride 0.9 % 100 mL IVPB     100 mg 78 mL/hr over 100 Minutes Intravenous Every 24 hours 10/13/16 1018 10/29/16 1459   10/13/16 1100  cefTRIAXone (ROCEPHIN) 2 g in dextrose 5 % 50 mL IVPB - Premix     2 g 100 mL/hr over 30 Minutes Intravenous Every 24 hours 10/13/16 1016     10/13/16 1100  metroNIDAZOLE (FLAGYL) IVPB 500 mg     500 mg 100 mL/hr over 60 Minutes Intravenous Every 8 hours 10/13/16 1016     10/13/16 1030  anidulafungin (ERAXIS) 200 mg in sodium chloride 0.9 % 200 mL IVPB     200 mg 78 mL/hr over 200 Minutes Intravenous  Once 10/13/16 1018 10/13/16 2052   09/27/16 2200  erythromycin 250 mg in sodium chloride 0.9 % 100 mL IVPB  Status:  Discontinued     250 mg 100 mL/hr over 60 Minutes Intravenous Every 8 hours 09/27/16 1842 09/27/16 1959   09/27/16 2200  erythromycin (EES) 400 MG/5ML suspension 250 mg  Status:  Discontinued     250 mg Oral Every  8 hours 09/27/16 1959 09/29/16 1147   09/24/16 1300  fluconazole (DIFLUCAN) tablet 400 mg  Status:  Discontinued     400 mg Oral Daily 09/24/16 1138 10/13/16 0953   09/21/16 1400  fluconazole (DIFLUCAN) IVPB 400 mg  Status:  Discontinued     400 mg 100 mL/hr over 120 Minutes Intravenous Every 24 hours 09/20/16 1220 09/20/16 1221   09/21/16 0000  anidulafungin (ERAXIS) 50 mg in sodium chloride 0.9 % 50 mL IVPB  Status:  Discontinued     50 mg 78 mL/hr over 50 Minutes Intravenous Every 24 hours 09/19/16 0345 09/19/16 0346   09/20/16 1400  fluconazole (DIFLUCAN) IVPB 800 mg  Status:  Discontinued     800 mg 200 mL/hr over 120 Minutes Intravenous  Once 09/20/16 1220 09/20/16 1221   09/20/16 1400  fluconazole (DIFLUCAN) IVPB 400 mg  Status:  Discontinued     400 mg 100 mL/hr over 120 Minutes Intravenous Every 24 hours 09/20/16 1221 09/24/16 1137   09/19/16 2200  anidulafungin (ERAXIS) 100 mg in sodium chloride 0.9 %  100 mL IVPB  Status:  Discontinued     100 mg 78 mL/hr over 100 Minutes Intravenous Every 24 hours 09/19/16 0345 09/20/16 1220   09/19/16 0400  anidulafungin (ERAXIS) 200 mg in sodium chloride 0.9 % 200 mL IVPB     200 mg 78 mL/hr over 200 Minutes Intravenous  Once 09/19/16 0345 09/19/16 0819   09/18/16 1800  fluconazole (DIFLUCAN) IVPB 100 mg  Status:  Discontinued     100 mg 50 mL/hr over 60 Minutes Intravenous Every 24 hours 09/18/16 1109 09/19/16 1937   09/17/16 1800  ceFEPIme (MAXIPIME) 2 g in dextrose 5 % 50 mL IVPB  Status:  Discontinued     2 g 100 mL/hr over 30 Minutes Intravenous Every 24 hours 09/17/16 1243 09/21/16 1200   09/17/16 1800  fluconazole (DIFLUCAN) IVPB 200 mg  Status:  Discontinued     200 mg 100 mL/hr over 60 Minutes Intravenous Every 24 hours 09/17/16 1651 09/18/16 1109   09/12/16 1400  ceFEPIme (MAXIPIME) 1 g in dextrose 5 % 50 mL IVPB  Status:  Discontinued     1 g 100 mL/hr over 30 Minutes Intravenous Every 8 hours 09/12/16 1127 09/17/16 1243    09/11/16 1600  metroNIDAZOLE (FLAGYL) IVPB 500 mg  Status:  Discontinued     500 mg 100 mL/hr over 60 Minutes Intravenous Every 8 hours 09/11/16 0902 09/21/16 1200   09/11/16 1400  ceFEPIme (MAXIPIME) 1 g in dextrose 5 % 50 mL IVPB  Status:  Discontinued     1 g 100 mL/hr over 30 Minutes Intravenous Every 24 hours 09/11/16 1234 09/12/16 1127   09/10/16 2000  vancomycin (VANCOCIN) IVPB 1000 mg/200 mL premix  Status:  Discontinued     1,000 mg 200 mL/hr over 60 Minutes Intravenous Every 24 hours 09/11/2016 2130 09/10/16 1616   09/10/16 1800  vancomycin (VANCOCIN) IVPB 750 mg/150 ml premix  Status:  Discontinued     750 mg 150 mL/hr over 60 Minutes Intravenous Every 12 hours 09/10/16 1616 09/12/16 1145   09/10/16 1400  levofloxacin (LEVAQUIN) IVPB 500 mg  Status:  Discontinued     500 mg 100 mL/hr over 60 Minutes Intravenous Every 24 hours 09/21/2016 2112 09/11/16 1220   09/10/16 0115  vancomycin (VANCOCIN) IVPB 1000 mg/200 mL premix  Status:  Discontinued     1,000 mg 200 mL/hr over 60 Minutes Intravenous  Once 09/10/16 0110 09/10/16 0118   09/27/2016 2200  metroNIDAZOLE (FLAGYL) IVPB 500 mg  Status:  Discontinued     500 mg 100 mL/hr over 60 Minutes Intravenous Every 6 hours 09/27/2016 2110 09/11/16 0902   09/16/2016 2115  vancomycin (VANCOCIN) IVPB 1000 mg/200 mL premix     1,000 mg 200 mL/hr over 60 Minutes Intravenous  Once 09/07/2016 2108 09/27/2016 2323   09/11/2016 1430  levofloxacin (LEVAQUIN) IVPB 750 mg     750 mg 100 mL/hr over 90 Minutes Intravenous  Once 09/25/2016 1415 09/03/2016 2152   09/13/2016 1430  metroNIDAZOLE (FLAGYL) IVPB 500 mg     500 mg 100 mL/hr over 60 Minutes Intravenous  Once 09/04/2016 1415 09/10/2016 1850   09/27/2016 1430  levofloxacin (LEVAQUIN) IVPB 750 mg  Status:  Discontinued     750 mg 100 mL/hr over 90 Minutes Intravenous  Once 09/11/2016 1416 09/18/2016 1416       Assessment/Plan Sepsis due to candidemia Splenic infarcts CHF Prolonged QTC Severe protein calorie  malnutrition Anemia Depression  SBO - CT scan 9/11 showed early or partial small  bowel obstruction with transition zone in the right lower quadrant - delayed XR did not show any contrast (possibly removed by NG tube?) and showed persistent small bowel dilation - passed gas yesterday, liquid BM today  FEN - IVF, NPO/NGT VTE - SCDs, no chemical DVT prophylaxis due to thrombocytopenia Foley - in place Follow up - TBD  Plan - NG tube with high output, no BS on exam but patient did have liquid BM this morning. Will repeat abdominal XR. Continue NPO/NGT to LIWS.   LOS: 35 days    Wellington [place] , [facility] 10/14/2016, 7:33 AM Pager: [phone] Consults: [phone] Mon-Fri 7:00 am-4:30 pm Sat-Sun 7:00 am-11:30 am

## 2016-10-14 NOTE — Progress Notes (Signed)
[place] for Infectious Disease  Date of Admission:  09/19/2016     Total days of antibiotics  Anidulafungin 10/13/16 -      Fluconazole 09/17/16 - 10/12/16 (QTc prolong s/e)     Ceftriaxone 10/13/16 -      Metronidazole 10/13/16 -           ASSESSMENT: - Candidemia, 09/2016 - Prolonged QTc >> improved  - Small bowel obstruction  - Small bowel wall thickening (enteritis vs. inflammation vs. ischemia) on CT - Diabetes mellitus with gastroparesis - Severe protein-calorie malnutrition (albumin 1.1)   PLAN: 1. QTc improved today (off Fluconazole) at 500 ms 2. Continue anidulafungin for 6 weeks as previously discussed for candidemia; endovascular stent repair for TAA in place  3. Repeat Abd XR with persistent mid/distal SBO. High output from NGT. Surgery evaluating. She is afebrile, normal WBC count. Will defer to primary team re: anbx for her enteritis.  4. PICC to be placed after recent cultures 9/11 NG x at least 72h. Will need double lumen for TNA + prolonged antifungal course 5. Available as needed.    Principal Problem:   Candidemia ([place]) Active Problems:   GERD (gastroesophageal reflux disease)   Dissection of thoracoabdominal aorta (HCC)   AAA (abdominal aortic aneurysm) (HCC)   Benign essential HTN   Diabetes mellitus type 2 in obese (HCC)   Anxiety state   Hyponatremia   Leukocytosis   Sepsis (HCC)   Hypotension   AKI (acute kidney injury) ([place])   Dehydration   Generalized weakness   Anemia   Thrombocytopenia (HCC)   Abnormal computed tomography angiography (CTA) of abdomen and pelvis   Colitis: Probable   Protein-calorie malnutrition, severe   Palliative care by specialist   Adult failure to thrive   Hypophosphatemia   Hypomagnesemia   Depression   Acute systolic CHF (congestive heart failure) ([place])   DNR (do not resuscitate) discussion   Congestive heart failure (HCC)   Heme positive stool   Cardiac LV ejection fraction 30-35%   QT  prolongation   . ALPRAZolam  0.25 mg Oral QHS  . aspirin EC  81 mg Oral Daily  . dronabinol  5 mg Oral BID AC  . feeding supplement (ENSURE ENLIVE)  237 mL Oral TID BM  . feeding supplement (PRO-STAT SUGAR FREE 64)  30 mL Per Tube TID  . feeding supplement (VITAL 1.5 CAL)  1,000 mL Per Tube Q24H  . insulin aspart  0-5 Units Subcutaneous QHS  . insulin aspart  0-9 Units Subcutaneous TID WC  . lisdexamfetamine  20 mg Oral Daily  . metoprolol tartrate  5 mg Intravenous Q6H  . scopolamine  1 patch Transdermal Q72H  . sodium chloride flush  3 mL Intravenous Q12H    SUBJECTIVE: Confused this morning and asking for her purse. Close friend at her bedside today. Reports flatus several times this morning and 2 liquid BMs. Improved nausea and abdominal pain. No fevers, chills.   Review of Systems: Review of Systems  Constitutional: Positive for malaise/fatigue. Negative for chills and fever.  Respiratory: Negative for cough and shortness of breath.   Cardiovascular: Positive for leg swelling. Negative for chest pain.  Gastrointestinal: Positive for abdominal pain and diarrhea. Negative for nausea and vomiting.  Genitourinary: Negative for dysuria and urgency.  Skin: Negative for rash.  Neurological: Negative for dizziness and headaches.  Endo/Heme/Allergies: Bruises/bleeds easily.    Allergies  Allergen Reactions  . Penicillins Rash and  Other (See Comments)    PATIENT HAS HAD A PCN REACTION WITH IMMEDIATE RASH, FACIAL/TONGUE/THROAT SWELLING, SOB, OR LIGHTHEADEDNESS WITH HYPOTENSION:  #  #  #  YES  #  #  #   Has patient had a PCN reaction causing severe rash involving mucus membranes or skin necrosis: no Has patient had a PCN reaction that required hospitalization no Has patient had a PCN reaction occurring within the last 10 years:no If all of the above answers are "NO", then may proceed with Cephalosporin use.  . Ativan [Lorazepam] Other (See Comments)    Makes pt very confused and  more agitated, irritable.  . Codeine Nausea And Vomiting  . Reglan [Metoclopramide] Other (See Comments)    TACHYCARDIA    OBJECTIVE: Vitals:   10/14/16 0400 10/14/16 0425 10/14/16 0800 10/14/16 0900  BP:  (!) 131/54 126/61   Pulse:   89   Resp:  (!) 22 19 20   Temp: 97.9 F (36.6 C)  97.8 F (36.6 C)   TempSrc:   Oral   SpO2:   100% 100%  Weight: 154 lb 3.2 oz (69.9 kg)     Height:       Body mass index is 27.32 kg/m.  Physical Exam  Constitutional: She is oriented to person, place, and time.  Chronically ill appearing elderly woman   Cardiovascular: Regular rhythm, normal heart sounds, intact distal pulses and normal pulses.  Tachycardia present.   Pulmonary/Chest: Effort normal and breath sounds normal. No respiratory distress. She has no rales.  Abdominal: Soft. Bowel sounds are tinkling. There is generalized tenderness.  Musculoskeletal: She exhibits edema.  Lymphadenopathy:    She has no cervical adenopathy.  Neurological: She is alert and oriented to person, place, and time.  Skin: Skin is warm and dry.  Multiple bruises noted across her chest   Psychiatric:  Confused today. Close friend at the bedside.     Lab Results Lab Results  Component Value Date   WBC 7.1 10/14/2016   HGB 10.5 (L) 10/14/2016   HCT 31.9 (L) 10/14/2016   MCV 97.6 10/14/2016   PLT 136 (L) 10/14/2016    Lab Results  Component Value Date   CREATININE 0.81 10/14/2016   BUN 13 10/14/2016   NA 140 10/14/2016   K 3.9 10/14/2016   CL 113 (H) 10/14/2016   CO2 22 10/14/2016    Lab Results  Component Value Date   ALT 16 10/14/2016   AST 24 10/14/2016   ALKPHOS 305 (H) 10/14/2016   BILITOT 0.5 10/14/2016     Microbiology: Recent Results (from the past 240 hour(s))  C difficile quick screen w PCR reflex     Status: None   Collection Time: 10/07/16 11:58 AM  Result Value Ref Range Status   C Diff antigen NEGATIVE NEGATIVE Final   C Diff toxin NEGATIVE NEGATIVE Final   C Diff  interpretation No C. difficile detected.  Final    Janene Madeira, MSN, NP-C [place] [facility] Infectious Disease [facility] Pager: [phone]  10/14/2016 11:00 AM

## 2016-10-14 NOTE — Progress Notes (Signed)
Progress Note  Patient Name: Haley Graham Date of Encounter: 10/14/2016  Primary Cardiologist: new  Subjective   Less abdominal distention and pain, no vomiting. KUB still shows SBO pattern, no improvement. However, she is passing flatus and had a liquid BM.  Inpatient Medications    Scheduled Meds: . ALPRAZolam  0.25 mg Oral QHS  . aspirin EC  81 mg Oral Daily  . dronabinol  5 mg Oral BID AC  . feeding supplement (ENSURE ENLIVE)  237 mL Oral TID BM  . feeding supplement (PRO-STAT SUGAR FREE 64)  30 mL Per Tube TID  . feeding supplement (VITAL 1.5 CAL)  1,000 mL Per Tube Q24H  . insulin aspart  0-5 Units Subcutaneous QHS  . insulin aspart  0-9 Units Subcutaneous TID WC  . lisdexamfetamine  20 mg Oral Daily  . metoprolol tartrate  5 mg Intravenous Q6H  . scopolamine  1 patch Transdermal Q72H  . sodium chloride flush  3 mL Intravenous Q12H   Continuous Infusions: . anidulafungin    . cefTRIAXone (ROCEPHIN)  IV Stopped (10/13/16 1704)  . dextrose 5 % and 0.9% NaCl 75 mL/hr at 10/14/16 0836  . famotidine (PEPCID) IV 20 mg (10/14/16 0946)  . metronidazole Stopped (10/14/16 0502)   PRN Meds: acetaminophen **OR** acetaminophen, alum & mag hydroxide-simeth, diphenhydrAMINE, diphenhydrAMINE, fluticasone, ipratropium-albuterol, lidocaine, menthol-cetylpyridinium, senna-docusate, sodium phosphate   Vital Signs    Vitals:   10/13/16 2000 10/14/16 0000 10/14/16 0400 10/14/16 0425  BP: (!) 130/58 103/81  (!) 131/54  Pulse:      Resp: 16 (!) 25  (!) 22  Temp:   97.9 F (36.6 C)   TempSrc:      SpO2: 98% 93%    Weight:   154 lb 3.2 oz (69.9 kg)   Height:        Intake/Output Summary (Last 24 hours) at 10/14/16 0951 Last data filed at 10/14/16 0630  Gross per 24 hour  Intake          2363.75 ml  Output             1210 ml  Net          1153.75 ml   Filed Weights   10/12/16 0400 10/13/16 0400 10/14/16 0400  Weight: 154 lb (69.9 kg) 153 lb 1.6 oz (69.4 kg) 154 lb 3.2  oz (69.9 kg)    Telemetry    Sinus tachycardia - Personally Reviewed  ECG    NSR, anterior T wave inversion, QTc 500 ms - Personally Reviewed  Physical Exam  Depressed, calm GEN: No acute distress.   Neck: No JVD Cardiac: RRR, no murmurs, rubs, or gallops.  Respiratory: Clear to auscultation bilaterally. GI: Soft, nontender, non-distended. Hypoactive bowel sounds.  MS: No edema; No deformity. Neuro:  Nonfocal  Psych: Normal affect   Labs    Chemistry Recent Labs Lab 10/11/16 0629 10/12/16 1439 10/13/16 0337 10/14/16 0339  NA 137 135 136 140  K 4.4 5.0 4.5 3.9  CL 105 104 109 113*  CO2 25 22 20* 22  GLUCOSE 124* 122* 91 104*  BUN 18 17 16 13   CREATININE 0.70 0.76 0.72 0.81  CALCIUM 7.1* 7.5* 7.0* 6.9*  PROT 4.4*  --  4.4* 4.8*  ALBUMIN 1.1*  --  1.1* 1.3*  AST 22  --  23 24  ALT 16  --  16 16  ALKPHOS 318*  --  302* 305*  BILITOT 0.5  --  0.6 0.5  GFRNONAA >60 >60 >60 >60  GFRAA >60 >60 >60 >60  ANIONGAP 7 9 7 5      Hematology Recent Labs Lab 10/12/16 1647 10/13/16 0337 10/14/16 0339  WBC 12.5* 8.3 7.1  RBC 3.23* 2.88* 3.27*  HGB 10.0* 9.2* 10.5*  HCT 30.8* 27.5* 31.9*  MCV 95.4 95.5 97.6  MCH 31.0 31.9 32.1  MCHC 32.5 33.5 32.9  RDW 18.5* 19.0* 19.5*  PLT 109* 101* 136*    Cardiac EnzymesNo results for input(s): TROPONINI in the last 168 hours. No results for input(s): TROPIPOC in the last 168 hours.   BNPNo results for input(s): BNP, PROBNP in the last 168 hours.   DDimer No results for input(s): DDIMER in the last 168 hours.   Radiology    Dg Abd 1 View  Result Date: 10/12/2016 CLINICAL DATA:  Vomiting and low mid abdominal pain since this morning. EXAM: ABDOMEN - 1 VIEW COMPARISON:  09/25/2016. FINDINGS: Feeding tube terminates in the descending duodenum. There is marked gaseous distension of small bowel. Minimal colonic gas with gas seen in the rectum. IMPRESSION: Bowel-gas pattern is indicative of a small bowel obstruction.  Electronically Signed   By: Lorin Picket M.D.   On: 10/12/2016 16:04   Ct Abdomen Pelvis W Contrast  Result Date: 10/13/2016 CLINICAL DATA:  Abdominal pain, nausea, and vomiting. History of gastroesophageal reflux disease, gastroparesis, diabetes. EXAM: CT ABDOMEN AND PELVIS WITH CONTRAST TECHNIQUE: Multidetector CT imaging of the abdomen and pelvis was performed using the standard protocol following bolus administration of intravenous contrast. CONTRAST:  175mL ISOVUE-300 IOPAMIDOL (ISOVUE-300) INJECTION 61% COMPARISON:  09/17/2016, 07/18/2016 FINDINGS: Lower chest: Small to moderate bilateral pleural effusions with basilar atelectasis or consolidation bilaterally, greater on the left. Dissection of the descending thoracic aorta with stent in the true lumen. Hepatobiliary: Mild diffuse fatty infiltration of the liver. No focal lesions identified. Surgical absence of the gallbladder. No bile duct dilatation. Pancreas: Unremarkable. No pancreatic ductal dilatation or surrounding inflammatory changes. Spleen: Wedge-shaped filling defects in the inferior spleen likely to represent splenic infarcts. Adrenals/Urinary Tract: 2.5 cm diameter heterogeneously enhancing adrenal gland nodule. No significant change since previous study. No other right adrenal gland nodules. Diffuse parenchymal atrophy and scarring of the left kidney. Heterogeneous nephrogram on the left may be due to infarcts or pyelonephritis. Right kidney is unremarkable. No hydronephrosis or hydroureter. The bladder is decompressed with a Foley catheter. Gas in the bladder is likely resulting from catheter insertion. Stomach/Bowel: There is an enteric tube with tip in the proximal duodenum. Stomach is mostly decompressed in the gastric wall is not significantly thickened. Small bowel are diffusely dilated and fluid-filled with thickening of the small bowel wall. The terminal ileum is decompressed with transition zone in the right lower quadrant. This  suggests distal small bowel obstruction or partial obstruction. Wall thickening may be due to enteritis or inflammatory change. Ischemia not entirely excluded but there is no evidence of pneumatosis or portal venous gas. Visualized mesenteric artery and vein appear patent. Colon is decompressed. Appendix is not seen. Vascular/Lymphatic: Dissection of the abdominal aorta extending from the descending aorta through to the bifurcation. No aneurysm. There is thrombosis of the true lumen beginning at about the level of the renal arteries. Left renal artery arises from the true lumen and appears compromised with probable thrombosis of the left renal artery. Right renal artery arises from the false lumen and is patent. Celiac axis and inferior mesenteric artery arise from the true lumen and are patent. Inferior mesenteric artery  appears occluded. Dissection extends into the iliac arteries bilaterally with thrombosis of the true lumen. False lumen is patent. External and internal iliac arteries appear patent. Vascular calcifications are present. Reproductive: Status post hysterectomy. No adnexal masses. Other: There is a small amount of free fluid in the pelvis, mesenteric, pericolic gutters, and around the liver and spleen. This is likely ascites. Edema in the subcutaneous fat of the abdominal wall. No free air in the abdomen. Loculated fluid collection in the right groin region could represent a hematoma if there is been instrumentation in the groin. This was present previously. Fatty atrophy of the musculature. Musculoskeletal: No destructive bone lesions. IMPRESSION: 1. 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 False lumen is patent. This is unchanged since 07/18/2016 study 2. 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 Appearance is similar to previous study. 3. 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  Appear new since previous study. 4. Mild diffuse abdominal and  pelvic ascites. 5. Early or partial small bowel obstruction with transition zone in the right lower quadrant. Small bowel wall thickening is likely due to enteritis or inflammatory change although ischemia not entirely excluded. No pneumatosis. Similar appearance on prior studies although there has been some progression in the interval. 6. The left adrenal gland nodule is unchanged. 7. Diffuse fatty infiltration of the liver. 8. Bilateral pleural effusions with basilar atelectasis or consolidation. These results were called by telephone at the time of interpretation on 10/13/2016 at 3:30 am to Dr. Chaney Malling , who verbally acknowledged these results. Electronically Signed   By: Lucienne Capers M.D.   On: 10/13/2016 03:30   Dg Abd Portable 1v  Result Date: 10/14/2016 CLINICAL DATA:  Small bowel obstruction EXAM: PORTABLE ABDOMEN - 1 VIEW COMPARISON:  10/13/2016 FINDINGS: Nasogastric tube extends to the proximal duodenum. The stomach is decompressed. Multiple dilated small bowel loops processed in the left upper mid abdomen, with little change in degree of dilatation in number of loops. The colon remains decompressed with residual contrast material in the distal sigmoid and rectum. Surgical clips right upper abdomen and overlying right femoral head. IMPRESSION: 1. No convincing radiographic improvement in apparent mid/distal small bowel obstruction. 2. Nasogastric tube traverses decompressed stomach. Electronically Signed   By: Lucrezia Europe M.D.   On: 10/14/2016 08:12   Dg Abd Portable 1v-small Bowel Obstruction Protocol-initial, 8 Hr Delay  Result Date: 10/13/2016 CLINICAL DATA:  Small bowel obstruction. EXAM: PORTABLE ABDOMEN - 1 VIEW COMPARISON:  CT abdomen and pelvis and single view of the abdomen 10/13/2016. FINDINGS: NG tube is unchanged. There is persistent gaseous distention of small bowel with loops up to 4.4 cm identified, not notably changed. IMPRESSION: Unchanged gaseous distention of small  bowel consistent small-bowel obstruction. Electronically Signed   By: Inge Rise M.D.   On: 10/13/2016 20:12   Dg Abd Portable 1v-small Bowel Protocol-position Verification  Result Date: 10/13/2016 CLINICAL DATA:  NG tube placement EXAM: PORTABLE ABDOMEN - 1 VIEW COMPARISON:  CT abdomen and pelvis 10/13/2016 FINDINGS: An enteric tube has been placed with tip in the right upper quadrant consistent with location in the distal stomach or possibly proximal duodenum. Gas-filled dilated small bowel consistent with small bowel obstruction. Small bowel wall thickening may indicate edema or inflammatory change. Residual contrast material in the renal collecting system. Surgical clips in the right upper quadrant. IMPRESSION: Enteric tube tip is in the right upper quadrant consistent with location in the distal stomach. Gaseous distention of small bowel  with wall thickening suggesting obstruction and enteritis. Electronically Signed   By: Lucienne Capers M.D.   On: 10/13/2016 05:42    Cardiac Studies    Echocardiogram 10/06/16: Study Conclusions - Left ventricle: The cavity size was normal. Systolic function was moderately reduced. The estimated ejection fraction was in the range of 35% to 40%. Hypokinesis of the mid-apicalanteroseptal, anterior, and inferolateral myocardium. Features are consistent with a pseudonormal left ventricular filling pattern, with concomitant abnormal relaxation and increased filling pressure (grade 2 diastolic dysfunction). - Aortic valve: Transvalvular velocity was within the normal range. There was no stenosis. There was no regurgitation. - Mitral valve: Transvalvular velocity was within the normal range. There was no evidence for stenosis. There was trivial regurgitation. - Left atrium: The atrium was mildly dilated. - Right ventricle: The cavity size was normal. Wall thickness was normal. Systolic function was normal. - Tricuspid valve: There  was no regurgitation.  Patient Profile     61 y.o. 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 Heart failure symptoms resolved promptly with diuretic therapy. Follow-up echo performed after one week showed slight improvement in EF 35-40%. Waxing and waning QT prolongation and ECG repolarization abnormalities in the anterolateral distribution.    Assessment & Plan    1.CHF/Cardiomyopathy:Check echo again today. Not in acute HF. If EF and wall motion have not yet resolved, this would essentially exclude stress cardiomyopathy. Ischemia would then be the clear cause for her wall motion abnormality and low EF, although it is still a challenge to decide what we would do about it. She remains too ill to consider invasive procedure such as angiography, especially when one takes into account the risks of approach from the right upper extremity and innominate artery in the vicinity of the proximal struts of the thoracic aortic stent. In addition, if we were to identify LAD disease, need to ensure that she would be able to take dual antiplatelet therapy without interruption for a minimum of 30 days if we use a bare-metal stent. With recent problems with small bowel obstruction and vomiting, this would've been a big challenge. Have discussed these issues with interventional colleagues, and they recognize the big challenges that angiography would pose. 2. Ao dissection: No chest pain. Chest CT August 26 did not show dissection the proximal ascending  aorta. On metoprolol, now being administered intravenously, adequate BP control. Blood pressure should be checked only in the right upper extremity.  3. Anemia:10.5 today, stable since transfusion 10/01/2016. Macrocytic, hyporegenerative, no evidence of iron or vitamin deficiency, associated with persistent thrombocytopenia. Suggests bone marrow insufficiency (malnutrition, infection or drug related?) and consider hematology consultation. 4. QT prolongation:Waxing and waning QT could well be explained by coronary insufficiency. Avoid agents that can prolong the QT interval further. 5. Severe malnutrition: albumin 1.3, a mild imprrovement. Considering TPN  Very complex patient, with multiple severe and life-threatening conditions, extreme malnutrition. Prognosis is dismal.  For questions or updates, please contact [facility] Please consult [email or web address] for contact info under Cardiology/STEMI.      Signed, Sanda Klein, MD  10/14/2016, 9:51 AM

## 2016-10-14 NOTE — Progress Notes (Signed)
  Echocardiogram 2D Echocardiogram has been performed.  Bobbye Charleston 10/14/2016, 2:14 PM

## 2016-10-14 NOTE — Progress Notes (Signed)
Physical Therapy Treatment Patient Details Name: Haley Graham MRN: [id number] DOB: December 18, 1955 Today's Date: 10/14/2016    History of Present Illness This 61 y.o. female admitted with sepsis, hypotension, dehydration, amemia, COPD, AKI due to poor oral intake and hypotension/dehydration.  PMH  includes:  Recent prolonged hospitalization for [facility] type B aortic dissection s/p descending thoracic aortic stent graft placement; anxiety, GERD, COPD,     PT Comments    Pt continues to be limited secondary to anxiety, confusion, and medical issues. MD and RN cleared for mobility to bed. REquired mod A +2 for transfer using stedy to chair. HR from 100s to 120 bpm this session. Current recommendations remain appropriate. Will continue to follow acutely.    Follow Up Recommendations  SNF     Equipment Recommendations  None recommended by PT    Recommendations for Other Services       Precautions / Restrictions Precautions Precautions: Fall Precaution Comments: NG tube Restrictions Weight Bearing Restrictions: No    Mobility  Bed Mobility Overal bed mobility: Needs Assistance Bed Mobility: Supine to Sit     Supine to sit: Mod assist;+2 for physical assistance;HOB elevated     General bed mobility comments: Mod A +2 for assisting with scooting LE towards EOB and for trunk elevation. Pt required simple one step commands.   Transfers Overall transfer level: Needs assistance   Transfers: Sit to/from Stand Sit to Stand: Mod assist;+2 physical assistance         General transfer comment: Mod A +2 to stand for transfer to chair with steady. Verbal cues for upright posture. Pt very anxious during transfer and required cues not to lean back on stedy. Required assist to maintain upright posture.   Ambulation/Gait                 Stairs            Wheelchair Mobility    Modified Rankin (Stroke Patients Only)       Balance Overall balance assessment: Needs  assistance Sitting-balance support: Feet supported;Bilateral upper extremity supported Sitting balance-Leahy Scale: Fair     Standing balance support: Bilateral upper extremity supported Standing balance-Leahy Scale: Poor Standing balance comment: With Bil UE support and support on either side, pt able to stand with use of Stedy                            Cognition Arousal/Alertness: Awake/alert Behavior During Therapy: Flat affect;Anxious Overall Cognitive Status: Impaired/Different from baseline Area of Impairment: Following commands;Problem solving;Orientation;Attention;Safety/judgement;Awareness                   Current Attention Level: Sustained Memory: Decreased short-term memory Following Commands: Follows one step commands consistently;Follows one step commands with increased time Safety/Judgement: Decreased awareness of safety;Decreased awareness of deficits Awareness: Intellectual Problem Solving: Slow processing;Requires verbal cues;Difficulty sequencing        Exercises      General Comments General comments (skin integrity, edema, etc.): RN requesting assist to get pt to chair as MD cleared for participation. HR up to 120 during transfer.       Pertinent Vitals/Pain Pain Assessment: Faces Faces Pain Scale: Hurts even more Pain Location: generalized Pain Descriptors / Indicators: Grimacing;Guarding;Moaning;Aching Pain Intervention(s): Limited activity within patient's tolerance;Monitored during session;Repositioned    Home Living                      Prior Function  PT Goals (current goals can now be found in the care plan section) Acute Rehab PT Goals Patient Stated Goal: none stated today PT Goal Formulation: With family Time For Goal Achievement: 10/23/2016 Potential to Achieve Goals: Fair Progress towards PT goals: Progressing toward goals    Frequency    Min 2X/week      PT Plan Current plan remains  appropriate    Co-evaluation              AM-PAC PT "6 Clicks" Daily Activity  Outcome Measure  Difficulty turning over in bed (including adjusting bedclothes, sheets and blankets)?: Unable Difficulty moving from lying on back to sitting on the side of the bed? : Unable Difficulty sitting down on and standing up from a chair with arms (e.g., wheelchair, bedside commode, etc,.)?: Unable Help needed moving to and from a bed to chair (including a wheelchair)?: A Lot Help needed walking in hospital room?: Total Help needed climbing 3-5 steps with a railing? : Total 6 Click Score: 7    End of Session Equipment Utilized During Treatment: Gait belt Activity Tolerance: Patient limited by fatigue Patient left: in chair;with call bell/phone within reach;with chair alarm set;with nursing/sitter in room Nurse Communication: Mobility status PT Visit Diagnosis: Muscle weakness (generalized) (M62.81);Other abnormalities of gait and mobility (R26.89);Unsteadiness on feet (R26.81);Adult, failure to thrive (R62.7)     Time: 1146-1209 PT Time Calculation (min) (ACUTE ONLY): 23 min  Charges:  $Therapeutic Activity: 23-37 mins                    G Codes:       Leighton Ruff, PT, DPT  Acute Rehabilitation Services  Pager: [phone]    Rudean Hitt 10/14/2016, 12:49 PM

## 2016-10-15 ENCOUNTER — Inpatient Hospital Stay (HOSPITAL_COMMUNITY): Payer: BLUE CROSS/BLUE SHIELD

## 2016-10-15 DIAGNOSIS — R0989 Other specified symptoms and signs involving the circulatory and respiratory systems: Secondary | ICD-10-CM

## 2016-10-15 DIAGNOSIS — I501 Left ventricular failure: Secondary | ICD-10-CM

## 2016-10-15 DIAGNOSIS — R0602 Shortness of breath: Secondary | ICD-10-CM

## 2016-10-15 DIAGNOSIS — G9341 Metabolic encephalopathy: Secondary | ICD-10-CM

## 2016-10-15 DIAGNOSIS — J9601 Acute respiratory failure with hypoxia: Secondary | ICD-10-CM

## 2016-10-15 LAB — BLOOD GAS, ARTERIAL
Acid-base deficit: 3 mmol/L — ABNORMAL HIGH (ref 0.0–2.0)
Acid-base deficit: 6.7 mmol/L — ABNORMAL HIGH (ref 0.0–2.0)
Bicarbonate: 20.6 mmol/L (ref 20.0–28.0)
Bicarbonate: 21.4 mmol/L (ref 20.0–28.0)
DRAWN BY: 252031
Delivery systems: POSITIVE
Drawn by: 252031
Expiratory PAP: 8
FIO2: 100
FIO2: 60
Inspiratory PAP: 18
O2 CONTENT: 15 L/min
O2 SAT: 97.8 %
O2 Saturation: 98.7 %
Patient temperature: 98.6
Patient temperature: 98.6
pCO2 arterial: 31.2 mmHg — ABNORMAL LOW (ref 32.0–48.0)
pCO2 arterial: 68.7 mmHg (ref 32.0–48.0)
pH, Arterial: 7.12 — CL (ref 7.350–7.450)
pH, Arterial: 7.435 (ref 7.350–7.450)
pO2, Arterial: 147 mmHg — ABNORMAL HIGH (ref 83.0–108.0)
pO2, Arterial: 177 mmHg — ABNORMAL HIGH (ref 83.0–108.0)

## 2016-10-15 LAB — TROPONIN I
Troponin I: 0.05 ng/mL (ref <0.03)
Troponin I: 0.07 ng/mL (ref <0.03)

## 2016-10-15 LAB — GLUCOSE, CAPILLARY
GLUCOSE-CAPILLARY: 115 mg/dL — AB (ref 65–99)
GLUCOSE-CAPILLARY: 95 mg/dL (ref 65–99)
GLUCOSE-CAPILLARY: 103 mg/dL — AB (ref 65–99)

## 2016-10-15 LAB — COMPREHENSIVE METABOLIC PANEL
ALK PHOS: 345 U/L — AB (ref 38–126)
ALT: 16 U/L (ref 14–54)
ALT: 15 U/L (ref 14–54)
ANION GAP: 4 — AB (ref 5–15)
AST: 23 U/L (ref 15–41)
AST: 20 U/L (ref 15–41)
Albumin: 1.4 g/dL — ABNORMAL LOW (ref 3.5–5.0)
Albumin: 1.3 g/dL — ABNORMAL LOW (ref 3.5–5.0)
Alkaline Phosphatase: 368 U/L — ABNORMAL HIGH (ref 38–126)
Anion gap: 7 (ref 5–15)
BUN: 10 mg/dL (ref 6–20)
BUN: 10 mg/dL (ref 6–20)
CALCIUM: 7.1 mg/dL — AB (ref 8.9–10.3)
CO2: 20 mmol/L — ABNORMAL LOW (ref 22–32)
CO2: 24 mmol/L (ref 22–32)
Calcium: 7.3 mg/dL — ABNORMAL LOW (ref 8.9–10.3)
Chloride: 114 mmol/L — ABNORMAL HIGH (ref 101–111)
Chloride: 114 mmol/L — ABNORMAL HIGH (ref 101–111)
Creatinine, Ser: 0.84 mg/dL (ref 0.44–1.00)
Creatinine, Ser: 0.87 mg/dL (ref 0.44–1.00)
GFR calc Af Amer: >60 mL/min (ref >60)
GFR calc non Af Amer: >60 mL/min (ref >60)
GFR calc non Af Amer: >60 mL/min (ref >60)
Glucose, Bld: 156 mg/dL — ABNORMAL HIGH (ref 65–99)
Glucose, Bld: 100 mg/dL — ABNORMAL HIGH (ref 65–99)
Potassium: 2.7 mmol/L — CL (ref 3.5–5.1)
Potassium: 3.4 mmol/L — ABNORMAL LOW (ref 3.5–5.1)
SODIUM: 142 mmol/L (ref 135–145)
Sodium: 141 mmol/L (ref 135–145)
Total Bilirubin: 0.7 mg/dL (ref 0.3–1.2)
Total Bilirubin: 0.4 mg/dL (ref 0.3–1.2)
Total Protein: 5.5 g/dL — ABNORMAL LOW (ref 6.5–8.1)
Total Protein: 4.9 g/dL — ABNORMAL LOW (ref 6.5–8.1)

## 2016-10-15 LAB — CBC WITH DIFFERENTIAL/PLATELET
BASOS PCT: 0 %
Basophils Absolute: 0 10*3/uL (ref 0.0–0.1)
EOS ABS: 0 10*3/uL (ref 0.0–0.7)
EOS PCT: 0 %
HCT: 25.7 % — ABNORMAL LOW (ref 36.0–46.0)
Hemoglobin: 8.1 g/dL — ABNORMAL LOW (ref 12.0–15.0)
LYMPHS ABS: 2.2 10*3/uL (ref 0.7–4.0)
Lymphocytes Relative: 18 %
MCH: 30.9 pg (ref 26.0–34.0)
MCHC: 31.5 g/dL (ref 30.0–36.0)
MCV: 98.1 fL (ref 78.0–100.0)
Monocytes Absolute: 0.8 10*3/uL (ref 0.1–1.0)
Monocytes Relative: 6 %
NEUTROS PCT: 76 %
Neutro Abs: 9.4 10*3/uL — ABNORMAL HIGH (ref 1.7–7.7)
PLATELETS: 146 10*3/uL — AB (ref 150–400)
RBC: 2.62 MIL/uL — AB (ref 3.87–5.11)
RDW: 19.3 % — ABNORMAL HIGH (ref 11.5–15.5)
WBC: 12.4 10*3/uL — AB (ref 4.0–10.5)

## 2016-10-15 LAB — CBC
HCT: 31.1 % — ABNORMAL LOW (ref 36.0–46.0)
Hemoglobin: 9.8 g/dL — ABNORMAL LOW (ref 12.0–15.0)
MCH: 30.6 pg (ref 26.0–34.0)
MCHC: 31.5 g/dL (ref 30.0–36.0)
MCV: 97.2 fL (ref 78.0–100.0)
Platelets: 177 10*3/uL (ref 150–400)
RBC: 3.2 MIL/uL — ABNORMAL LOW (ref 3.87–5.11)
RDW: 19 % — ABNORMAL HIGH (ref 11.5–15.5)
WBC: 12.7 10*3/uL — ABNORMAL HIGH (ref 4.0–10.5)

## 2016-10-15 LAB — PHOSPHORUS: PHOSPHORUS: 3.4 mg/dL (ref 2.5–4.6)

## 2016-10-15 LAB — LACTIC ACID, PLASMA: Lactic Acid, Venous: 1.2 mmol/L (ref 0.5–1.9)

## 2016-10-15 LAB — MAGNESIUM: Magnesium: 1.6 mg/dL — ABNORMAL LOW (ref 1.7–2.4)

## 2016-10-15 MED ORDER — SODIUM CHLORIDE 0.9% FLUSH
10.0000 mL | INTRAVENOUS | Status: DC | PRN
  Administered 2016-10-16: 20 mL
  Administered 2016-10-17: 10 mL
  Administered 2016-10-18 – 2016-10-23 (×3): 20 mL
  Administered 2016-10-23: 10 mL
  Filled 2016-10-15 (×6): qty 40

## 2016-10-15 MED ORDER — METOPROLOL TARTRATE 5 MG/5ML IV SOLN
7.5000 mg | Freq: Four times a day (QID) | INTRAVENOUS | Status: DC
  Administered 2016-10-15 – 2016-10-21 (×24): 7.5 mg via INTRAVENOUS
  Filled 2016-10-15 (×25): qty 10

## 2016-10-15 MED ORDER — FUROSEMIDE 10 MG/ML IJ SOLN
40.0000 mg | Freq: Once | INTRAMUSCULAR | Status: AC
  Administered 2016-10-15: 40 mg via INTRAVENOUS

## 2016-10-15 MED ORDER — SODIUM CHLORIDE 0.9% FLUSH
10.0000 mL | Freq: Two times a day (BID) | INTRAVENOUS | Status: DC
  Administered 2016-10-17 – 2016-10-25 (×9): 10 mL

## 2016-10-15 MED ORDER — MAGNESIUM SULFATE 2 GM/50ML IV SOLN
2.0000 g | Freq: Once | INTRAVENOUS | Status: AC
  Administered 2016-10-15: 2 g via INTRAVENOUS
  Filled 2016-10-15: qty 50

## 2016-10-15 MED ORDER — POTASSIUM CHLORIDE 10 MEQ/100ML IV SOLN
10.0000 meq | INTRAVENOUS | Status: AC
  Administered 2016-10-15 (×2): 10 meq via INTRAVENOUS
  Filled 2016-10-15 (×3): qty 100

## 2016-10-15 MED ORDER — FUROSEMIDE 10 MG/ML IJ SOLN
40.0000 mg | Freq: Once | INTRAMUSCULAR | Status: AC
  Administered 2016-10-15: 40 mg via INTRAVENOUS
  Filled 2016-10-15: qty 4

## 2016-10-15 MED ORDER — FUROSEMIDE 10 MG/ML IJ SOLN
INTRAMUSCULAR | Status: AC
  Filled 2016-10-15: qty 4

## 2016-10-15 MED ORDER — POTASSIUM CHLORIDE 10 MEQ/100ML IV SOLN
10.0000 meq | INTRAVENOUS | Status: AC
  Administered 2016-10-15 (×3): 10 meq via INTRAVENOUS
  Filled 2016-10-15 (×3): qty 100

## 2016-10-15 MED ORDER — POTASSIUM CHLORIDE 10 MEQ/100ML IV SOLN
10.0000 meq | INTRAVENOUS | Status: AC
  Administered 2016-10-15 – 2016-10-16 (×3): 10 meq via INTRAVENOUS
  Filled 2016-10-15 (×3): qty 100

## 2016-10-15 NOTE — Progress Notes (Signed)
Per Katharine Look RN, states not to place PIV.  States that MD said PICC was approved by ID to be placed today.  Requested that info to be placed in progress notes or under orders.

## 2016-10-15 NOTE — Progress Notes (Signed)
Placed patient on BIPAP due to respiratory distress. Rapid response at bedside.

## 2016-10-15 NOTE — Progress Notes (Signed)
Peripherally Inserted Central Catheter/Midline Placement  The IV Nurse has discussed with the patient and/or persons authorized to consent for the patient, the purpose of this procedure and the potential benefits and risks involved with this procedure.  The benefits include less needle sticks, lab draws from the catheter, and the patient may be discharged home with the catheter. Risks include, but not limited to, infection, bleeding, blood clot (thrombus formation), and puncture of an artery; nerve damage and irregular heartbeat and possibility to perform a PICC exchange if needed/ordered by physician.  Alternatives to this procedure were also discussed.  Bard Power PICC patient education guide, fact sheet on infection prevention and patient information card has been provided to patient /or left at bedside.    PICC/Midline Placement Documentation  PICC Triple Lumen 10/15/16 PICC Right Brachial 42 cm 0 cm (Active)  Indication for Insertion or Continuance of Line Administration of hyperosmolar/irritating solutions (i.e. TPN, Vancomycin, etc.) 10/15/2016 12:06 PM  Exposed Catheter (cm) 0 cm 10/15/2016 12:06 PM  Site Assessment Clean;Dry;Intact;Ecchymotic;Edematous 10/15/2016 12:06 PM  Lumen #1 Status Flushed;Saline locked;Blood return noted 10/15/2016 12:06 PM  Lumen #2 Status Flushed;Saline locked;Blood return noted 10/15/2016 12:06 PM  Lumen #3 Status Flushed;Saline locked;Blood return noted 10/15/2016 12:06 PM  Dressing Type Transparent;Securing device 10/15/2016 12:06 PM  Dressing Status Clean;Dry;Intact;Antimicrobial disc in place 10/15/2016 12:06 PM  Dressing Change Due 10/22/16 10/15/2016 12:06 PM       Frances Maywood 10/15/2016, 12:09 PM

## 2016-10-15 NOTE — Progress Notes (Signed)
Patient complaint of chest painbecome restless and confused,HR at 130s,RR  30s and O2 saturation drops to 70% from 97% on room air.EKG and breathing treatment done,rapid response RN and K.Schoor NP,([facility]ad on call) notified.IV lasix 40 mg given as ordered.will continue to monitor.

## 2016-10-15 NOTE — Progress Notes (Signed)
Progress Note  Patient Name: Haley Graham Date of Encounter: 10/15/2016  Primary Cardiologist: New  Subjective   Deteriorated overnight. Developed pulmonary edema and hypercapnic respiratory failure, improved with BiPAP and diuretics. This morning she has delirium. She is seeing people in the room and is a little agitated. Blood pressure consequently a little higher. Severely hypokalemic. Repeat echo yesterday shows no change from previous study, with persistent moderate reduction in LVEF 35-40% due to akinesis of the LV myocardium in the entire distribution of the LAD artery.  Inpatient Medications    Scheduled Meds: . ALPRAZolam  0.25 mg Oral QHS  . aspirin EC  81 mg Oral Daily  . dronabinol  5 mg Oral BID AC  . feeding supplement (ENSURE ENLIVE)  237 mL Oral TID BM  . furosemide      . insulin aspart  0-5 Units Subcutaneous QHS  . insulin aspart  0-9 Units Subcutaneous TID WC  . lisdexamfetamine  20 mg Oral Daily  . metoprolol tartrate  5 mg Intravenous Q6H  . scopolamine  1 patch Transdermal Q72H  . sodium chloride flush  3 mL Intravenous Q12H   Continuous Infusions: . anidulafungin Stopped (10/14/16 1724)  . cefTRIAXone (ROCEPHIN)  IV Stopped (10/14/16 1440)  . famotidine (PEPCID) IV Stopped (10/14/16 2157)  . metronidazole Stopped (10/15/16 0325)  . potassium chloride Stopped (10/15/16 0659)   PRN Meds: acetaminophen **OR** acetaminophen, alum & mag hydroxide-simeth, diphenhydrAMINE, diphenhydrAMINE, fluticasone, ipratropium-albuterol, lidocaine, menthol-cetylpyridinium, senna-docusate, sodium phosphate   Vital Signs    Vitals:   10/14/16 2000 10/15/16 0104 10/15/16 0200 10/15/16 0522  BP: (!) 144/85  (!) 146/75 139/76  Pulse:  (!) 107 (!) 118   Resp: (!) 24 (!) 22 (!) 21 16  Temp: (!) 97.5 F (36.4 C)  98.7 F (37.1 C) 97.6 F (36.4 C)  TempSrc: Oral   Oral  SpO2: 97% 100% 99% 100%  Weight:      Height:        Intake/Output Summary (Last 24 hours)  at 10/15/16 0957 Last data filed at 10/15/16 0651  Gross per 24 hour  Intake             1150 ml  Output             2000 ml  Net             -850 ml   Filed Weights   10/12/16 0400 10/13/16 0400 10/14/16 0400  Weight: 154 lb (69.9 kg) 153 lb 1.6 oz (69.4 kg) 154 lb 3.2 oz (69.9 kg)    Telemetry    Sinus tachycardia - Personally Reviewed  ECG    Sinus tachycardia, anterior T-wave inversion slightly improved since yesterday, QTC still markedly prolonged at 513 ms - Personally Reviewed  Physical Exam  Awake, mildly agitated, delirious, visual hallucinations GEN: No acute distress.   Neck:  hard to evaluate JVD appears normal Cardiac: RRR, no murmurs, rubs, or gallops.  Respiratory: Clear to auscultation bilaterally. GI: Soft, nontender, non-distended  MS: No edema; No deformity. Neuro:  Nonfocal  Psych: Delirium  Labs    Chemistry Recent Labs Lab 10/13/16 0337 10/14/16 0339 10/15/16 0301  NA 136 140 141  K 4.5 3.9 2.7*  CL 109 113* 114*  CO2 20* 22 20*  GLUCOSE 91 104* 156*  BUN 16 13 10   CREATININE 0.72 0.81 0.84  CALCIUM 7.0* 6.9* 7.3*  PROT 4.4* 4.8* 5.5*  ALBUMIN 1.1* 1.3* 1.4*  AST 23 24 23  ALT 16 16 16   ALKPHOS 302* 305* 368*  BILITOT 0.6 0.5 0.7  GFRNONAA >60 >60 >60  GFRAA >60 >60 >60  ANIONGAP 7 5 7      Hematology Recent Labs Lab 10/13/16 0337 10/14/16 0339 10/15/16 0301  WBC 8.3 7.1 12.7*  RBC 2.88* 3.27* 3.20*  HGB 9.2* 10.5* 9.8*  HCT 27.5* 31.9* 31.1*  MCV 95.5 97.6 97.2  MCH 31.9 32.1 30.6  MCHC 33.5 32.9 31.5  RDW 19.0* 19.5* 19.0*  PLT 101* 136* 177    Cardiac Enzymes Recent Labs Lab 10/15/16 0301  TROPONINI 0.05*    Radiology    Dg Chest Port 1 View  Result Date: 10/15/2016 CLINICAL DATA:  Respiratory distress tonight. EXAM: PORTABLE CHEST 1 VIEW COMPARISON:  Single-view of the chest 09/30/2016. FINDINGS: NG tube is in place. Aortic stent graft is noted. There is bilateral mid and lower lung zone airspace disease  and left greater than right pleural effusions. Interstitial pulmonary edema is identified. No pneumothorax. IMPRESSION: Interstitial pulmonary edema with left worse than right pleural effusions. Worsened bibasilar airspace disease which could be due to atelectasis or pneumonia. Electronically Signed   By: Inge Rise M.D.   On: 10/15/2016 00:51   Dg Abd Portable 1v  Result Date: 10/14/2016 CLINICAL DATA:  Small bowel obstruction EXAM: PORTABLE ABDOMEN - 1 VIEW COMPARISON:  10/13/2016 FINDINGS: Nasogastric tube extends to the proximal duodenum. The stomach is decompressed. Multiple dilated small bowel loops processed in the left upper mid abdomen, with little change in degree of dilatation in number of loops. The colon remains decompressed with residual contrast material in the distal sigmoid and rectum. Surgical clips right upper abdomen and overlying right femoral head. IMPRESSION: 1. No convincing radiographic improvement in apparent mid/distal small bowel obstruction. 2. Nasogastric tube traverses decompressed stomach. Electronically Signed   By: Lucrezia Europe M.D.   On: 10/14/2016 08:12   Dg Abd Portable 1v-small Bowel Obstruction Protocol-initial, 8 Hr Delay  Result Date: 10/13/2016 CLINICAL DATA:  Small bowel obstruction. EXAM: PORTABLE ABDOMEN - 1 VIEW COMPARISON:  CT abdomen and pelvis and single view of the abdomen 10/13/2016. FINDINGS: NG tube is unchanged. There is persistent gaseous distention of small bowel with loops up to 4.4 cm identified, not notably changed. IMPRESSION: Unchanged gaseous distention of small bowel consistent small-bowel obstruction. Electronically Signed   By: Inge Rise M.D.   On: 10/13/2016 20:12    Cardiac Studies   Echo 10/14/2016 - Left ventricle: The cavity size was normal. There was mild   concentric hypertrophy. Systolic function was moderately reduced.   The estimated ejection fraction was in the range of 35% to 40%.   There is akinesis of the  mid-apicalanteroseptal, anterior,   anterolateral, lateral, and apical myocardium. The study is not   technically sufficient to allow evaluation of LV diastolic   function. - Mitral valve: Calcified annulus.  Impressions:  - Relatively unchanged when compared to prior study 10/06/16  Patient Profile     61 y.o. 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 Readmitted in Augustwith sepsis syndrome, fungemiaand concern for enteritis, anemia and thrombocytopenia. 08/26 developed acute systolic heart failure and echo shows newly depressed LVEF of 30-35%, with akinesis of the mid apical anterior and anteroseptal segments. Heart failure symptoms resolved promptly with diuretic therapy.Follow-up echo performed after one week showed  slight improvement in EF 35-40%, unchanged on echo performed after another week on 09/12. Develops recurrent pulmonary edema on evening of 9/12, again improved with diuretics. Waxing and waning QT prolongation and ECG repolarization abnormalities in the anterolateral distribution.   Assessment & Plan      1.CHF/Cardiomyopathy:The more time that passes since the initial episode of heart failure, the less likely that this is stress cardiomyopathy in the more likely it is that she has LAD stenosis and ischemia. I think we are compelled to proceed with coronary angiography to clarify the diagnosis and if necessary provided revascularization. With current problems with delirium, inability to take by mouth meds and severe hypokalemia she is not ready for cardiac catheterization, but have tentatively scheduled this procedure for early next week with Dr. Burt Knack, after we reviewed all the data together. It would be critical for her to be able to take oral medications to prevent  acute stent thrombosis. For the same considerations, I would recommend implantation of a bare-metal stent, to limit the duration of mandatory dual antiplatelet therapy. 2. Ao dissection: No chest pain. Chest CT August 26 did not show dissection the proximal ascending aorta. On metoprolol, now being administered intravenously, not quite at desirable BP control. Will increase the beta blocker Blood pressure should be checked only in the right upper extremity.  3. Anemia:10.5 yesterday, stable since transfusion 10/01/2016. Association with persistent thrombocytopenia and the absence of microcytosis suggests bone marrow insufficiency (malnutrition, infection or drug related?). 4. QT prolongation:Today's particularly related to severe hypokalemia. Waxing and waning QT could well be explained by coronary insufficiency. Avoid agents that can prolong the QT interval further. 5. Severe malnutrition: albumin 1.3 yesterday, a mild imprrovement. Considering TPN. PICC line being placed. 6. SBO: Appears to be improving, still unable to administer oral medications. 7. Delirium: multifactorial  Very complex patient, with multiple severe and life-threatening conditions, extreme malnutrition. Prognosis is dismal.  For questions or updates, please contact Gary Please consult [email or web address] for contact info under Cardiology/STEMI.      Signed, Sanda Klein, MD  10/15/2016, 9:57 AM

## 2016-10-15 NOTE — Significant Event (Signed)
Rapid Response Event Note RN called for aggitation and increase WOB  Overview: Time Called: 0006 Arrival Time: 0010 Event Type: Respiratory, Other (Comment) (aggitation )  Initial Focused Assessment: Pt lying supine in bed, use of accessory muscles to breathe, alert and restless, trying to pull at lines, confused, and diaphoretic. Pt became more lethargic and difficult to arouse after being placed on bipap. RR 30-40's  Interventions: CXR, Bipap, IV lasix  Plan of Care (if not transferred): Continue to monitor, collect repeat ABG and labs 0245  Event Summary: Name of Physician Notified: Schorr NP  at  (PTA )    at    Outcome: Stayed in room and stabalized     [place], [place]

## 2016-10-15 NOTE — Progress Notes (Signed)
Repeat arterial blood gas was drawn at 0255. Patient awake and asking for the bipap to be removed. Patient in no respiratory distress at this time. Removed patient from bipap and placed on 6lpm high flow cannula. Will continue to monitor patients status. Nurse is aware of change.

## 2016-10-15 NOTE — Progress Notes (Signed)
Patient ID: Haley Graham, female   DOB: Dec 26, 1955, 61 y.o.   MRN: [id number]  Scheduled appointment with husband for 1700 today.     Discussed with visitor/pastor's wife at bedside.   I waited until 1800 and he hadn't arrived, will call him again in the morning and attempt to reschedule.  Wadie Lessen NP  Palliative Medicine Team Team Phone # [phone] Pager [phone]  No charge

## 2016-10-15 NOTE — Progress Notes (Addendum)
Shift event note:  Notified by RN at approx 0030 regarding pt becoming somewhat tachypneic with decreased sats into the 80's while sitting in chair. This seemed to improve once pt assisted back to bed and placed on NRB. Orders placed for Lasix 40 mg IV and stat PCXR and ABG. Before I could get to bedside was re-paged and informed pt's resp status worse with increased WOB. At bedside pt noted w/ significantly increased WOB w/ accessory muscle use. BBS diminished w/ poor air movement bilaterally.  She is alert and restless, attempting to pull at lines, etc. She remains confused. ABG > pH-7.12 > pC02-68.7 > p02-177 and bicarb 21.4. CXR > findings c/w acute pulmonary edema. Though sats improved on NRB, WOB not improved, ultimately requiring BiPAP. After placed on BiPAP pt noted to become very lethargic and difficult to arouse. Discussed pt w/ Dr Jimmy Footman w/ [facility] who agreed to send PCCM provider to bedside to evaluate pt. Junius Roads, PA w/ PCCM service arrived to bedside but was called away urgently to attend coding pt on another unit. Monitored pt at bedside for approx 30 min and noted pt appeared more awake and responsive and tolerating BiPAP. Junius Roads, PA returned to bedside and was updated. Assessment/Plan: 1. Acute hypercapnic respiratory failure: In setting of acute pulmonary edema. Responding to IV Lasix. Continues to tolerate BiPAP. PCCM recommended repeat dose of Lasix and continue BiPAP. Will repeat ABG at 0245. (Labs to be drawn w/ ABG as unable to draw labs d/t poor access). CBC CMET, Trop and Lactate pending. Pt awaiting PICC placement. IVF's stopped. I updated spouse at bedside. See PCCM note. Appreciate PCCM input and recommendations. Will continue to monitor closely on SDU.   Jeryl Columbia, NP-C [facility] Pager [phone]   CRITICAL CARE Performed by: Jeryl Columbia   Total critical care time: 120 minutes  Critical care time was exclusive of separately billable procedures  and treating other patients.  Critical care was necessary to treat or prevent imminent or life-threatening deterioration.  Critical care was time spent personally by me on the following activities: development of treatment plan with patient and/or surrogate as well as nursing, discussions with consultants, evaluation of patient's response to treatment, examination of patient, obtaining history from patient or surrogate, ordering and performing treatments and interventions, ordering and review of laboratory studies, ordering and review of radiographic studies, pulse oximetry and re-evaluation of patient's condition.

## 2016-10-15 NOTE — Progress Notes (Signed)
Called PA [facility] for question concerning blood pressure order. Will take blood pressure in right lower leg due to PICC placement in right upper arm and stent in left subclavian area.  Pt resting with call bell within reach.  Will continue to monitor. Payton Emerald, RN

## 2016-10-15 NOTE — Progress Notes (Signed)
RT called by RN for pt complaint of chest pain and SOB. RT assessed pt, VS stable. Gave PRN duoneb and pt states that it helped a little but still wanted to wear BIPAP.  Placed BIPAP on pt for pt comfort. Pt resting comfortably. RT will cont to monitor.

## 2016-10-15 NOTE — Progress Notes (Signed)
PHARMACY CONSULT NOTE FOR:  OUTPATIENT  PARENTERAL ANTIBIOTIC THERAPY (OPAT)  Indication: candidemia Regimen: 100 mg IV every 24 hours  End date: 10/28/2016  IV antibiotic discharge orders are pended. To discharging provider:  please sign these orders via discharge navigator,  Select New Orders & click on the button choice - Manage This Unsigned Work once patient is ready to discharge.  Pharmacy will adjust discharge units on order.   Thank you for allowing pharmacy to be a part of this patient's care.  Doylene Canard, PharmD Clinical Pharmacist  Phone: [phone] 10/15/2016, 8:41 AM

## 2016-10-15 NOTE — Progress Notes (Signed)
Called Dr. Grandville Silos to confirm that patient was only to get sips/ chips and some clear liquids today.  Patient is asking for ensure.  Pt/family made aware that clears only today and MD will reassess tomorrow. Pt resting with call bell within reach.  Will continue to monitor.

## 2016-10-15 NOTE — Progress Notes (Signed)
Patient ID: Haley Graham, female   DOB: May 09, 1955, 61 y.o.   MRN: [id number]  [facility] Surgery Progress Note     Subjective: CC- SBO Patient with respiratory distress over night, seems to be improving after lasix and BiPAP.  Denies any current abdominal pain. States that she feels less bloating. Passing small amount of flatus. Reports 3 small/medial loose BM's yesterday.  Objective: Vital signs in last 24 hours: Temp:  [97.5 F (36.4 C)-98.7 F (37.1 C)] 97.6 F (36.4 C) (09/13 0522) Pulse Rate:  [89-118] 118 (09/13 0200) Resp:  [16-24] 16 (09/13 0522) BP: (126-146)/(61-85) 139/76 (09/13 0522) SpO2:  [97 %-100 %] 100 % (09/13 0522) Last BM Date: 10/14/16  Intake/Output from previous day: 09/12 0701 - 09/13 0700 In: 1150 [I.V.:550; IV Piggyback:600] Out: 2000 [Urine:1900; Emesis/NG output:100] Intake/Output this shift: No intake/output data recorded.  PE: Gen:  Alert, NAD, pleasant HEENT: EOM's intact, pupils equal and round Card:  tachy Pulm:  CTAB, no W/R/R, effort normal Abd: Soft, nondistended, few BS heard, no HSM, no hernia, nontender Psych: A&Ox3  Skin: no rashes noted, warm and dry   Lab Results:   Recent Labs  10/14/16 0339 10/15/16 0301  WBC 7.1 12.7*  HGB 10.5* 9.8*  HCT 31.9* 31.1*  PLT 136* 177   BMET  Recent Labs  10/14/16 0339 10/15/16 0301  NA 140 141  K 3.9 2.7*  CL 113* 114*  CO2 22 20*  GLUCOSE 104* 156*  BUN 13 10  CREATININE 0.81 0.84  CALCIUM 6.9* 7.3*   PT/INR No results for input(s): LABPROT, INR in the last 72 hours. CMP     Component Value Date/Time   NA 141 10/15/2016 0301   K 2.7 (LL) 10/15/2016 0301   CL 114 (H) 10/15/2016 0301   CO2 20 (L) 10/15/2016 0301   GLUCOSE 156 (H) 10/15/2016 0301   BUN 10 10/15/2016 0301   CREATININE 0.84 10/15/2016 0301   CALCIUM 7.3 (L) 10/15/2016 0301   PROT 5.5 (L) 10/15/2016 0301   ALBUMIN 1.4 (L) 10/15/2016 0301   AST 23 10/15/2016 0301   ALT 16 10/15/2016 0301   ALKPHOS 368 (H) 10/15/2016 0301   BILITOT 0.7 10/15/2016 0301   GFRNONAA >60 10/15/2016 0301   GFRAA >60 10/15/2016 0301   Lipase     Component Value Date/Time   LIPASE 14 09/18/2016 1139       Studies/Results: Dg Chest Port 1 View  Result Date: 10/15/2016 CLINICAL DATA:  Respiratory distress tonight. EXAM: PORTABLE CHEST 1 VIEW COMPARISON:  Single-view of the chest 09/30/2016. FINDINGS: NG tube is in place. Aortic stent graft is noted. There is bilateral mid and lower lung zone airspace disease and left greater than right pleural effusions. Interstitial pulmonary edema is identified. No pneumothorax. IMPRESSION: Interstitial pulmonary edema with left worse than right pleural effusions. Worsened bibasilar airspace disease which could be due to atelectasis or pneumonia. Electronically Signed   By: Inge Rise M.D.   On: 10/15/2016 00:51   Dg Abd Portable 1v  Result Date: 10/14/2016 CLINICAL DATA:  Small bowel obstruction EXAM: PORTABLE ABDOMEN - 1 VIEW COMPARISON:  10/13/2016 FINDINGS: Nasogastric tube extends to the proximal duodenum. The stomach is decompressed. Multiple dilated small bowel loops processed in the left upper mid abdomen, with little change in degree of dilatation in number of loops. The colon remains decompressed with residual contrast material in the distal sigmoid and rectum. Surgical clips right upper abdomen and overlying right femoral head. IMPRESSION: 1. No convincing  radiographic improvement in apparent mid/distal small bowel obstruction. 2. Nasogastric tube traverses decompressed stomach. Electronically Signed   By: Lucrezia Europe M.D.   On: 10/14/2016 08:12   Dg Abd Portable 1v-small Bowel Obstruction Protocol-initial, 8 Hr Delay  Result Date: 10/13/2016 CLINICAL DATA:  Small bowel obstruction. EXAM: PORTABLE ABDOMEN - 1 VIEW COMPARISON:  CT abdomen and pelvis and single view of the abdomen 10/13/2016. FINDINGS: NG tube is unchanged. There is persistent gaseous  distention of small bowel with loops up to 4.4 cm identified, not notably changed. IMPRESSION: Unchanged gaseous distention of small bowel consistent small-bowel obstruction. Electronically Signed   By: Inge Rise M.D.   On: 10/13/2016 20:12    Anti-infectives: Anti-infectives    Start     Dose/Rate Route Frequency Ordered Stop   10/14/16 1500  anidulafungin (ERAXIS) 100 mg in sodium chloride 0.9 % 100 mL IVPB     100 mg 78 mL/hr over 100 Minutes Intravenous Every 24 hours 10/13/16 1018 11/25/16 1459   10/13/16 1100  cefTRIAXone (ROCEPHIN) 2 g in dextrose 5 % 50 mL IVPB - Premix     2 g 100 mL/hr over 30 Minutes Intravenous Every 24 hours 10/13/16 1016     10/13/16 1100  metroNIDAZOLE (FLAGYL) IVPB 500 mg     500 mg 100 mL/hr over 60 Minutes Intravenous Every 8 hours 10/13/16 1016     10/13/16 1030  anidulafungin (ERAXIS) 200 mg in sodium chloride 0.9 % 200 mL IVPB     200 mg 78 mL/hr over 200 Minutes Intravenous  Once 10/13/16 1018 10/13/16 2052   09/27/16 2200  erythromycin 250 mg in sodium chloride 0.9 % 100 mL IVPB  Status:  Discontinued     250 mg 100 mL/hr over 60 Minutes Intravenous Every 8 hours 09/27/16 1842 09/27/16 1959   09/27/16 2200  erythromycin (EES) 400 MG/5ML suspension 250 mg  Status:  Discontinued     250 mg Oral Every 8 hours 09/27/16 1959 09/29/16 1147   09/24/16 1300  fluconazole (DIFLUCAN) tablet 400 mg  Status:  Discontinued     400 mg Oral Daily 09/24/16 1138 10/13/16 0953   09/21/16 1400  fluconazole (DIFLUCAN) IVPB 400 mg  Status:  Discontinued     400 mg 100 mL/hr over 120 Minutes Intravenous Every 24 hours 09/20/16 1220 09/20/16 1221   09/21/16 0000  anidulafungin (ERAXIS) 50 mg in sodium chloride 0.9 % 50 mL IVPB  Status:  Discontinued     50 mg 78 mL/hr over 50 Minutes Intravenous Every 24 hours 09/19/16 0345 09/19/16 0346   09/20/16 1400  fluconazole (DIFLUCAN) IVPB 800 mg  Status:  Discontinued     800 mg 200 mL/hr over 120 Minutes Intravenous   Once 09/20/16 1220 09/20/16 1221   09/20/16 1400  fluconazole (DIFLUCAN) IVPB 400 mg  Status:  Discontinued     400 mg 100 mL/hr over 120 Minutes Intravenous Every 24 hours 09/20/16 1221 09/24/16 1137   09/19/16 2200  anidulafungin (ERAXIS) 100 mg in sodium chloride 0.9 % 100 mL IVPB  Status:  Discontinued     100 mg 78 mL/hr over 100 Minutes Intravenous Every 24 hours 09/19/16 0345 09/20/16 1220   09/19/16 0400  anidulafungin (ERAXIS) 200 mg in sodium chloride 0.9 % 200 mL IVPB     200 mg 78 mL/hr over 200 Minutes Intravenous  Once 09/19/16 0345 09/19/16 0819   09/18/16 1800  fluconazole (DIFLUCAN) IVPB 100 mg  Status:  Discontinued     100  mg 50 mL/hr over 60 Minutes Intravenous Every 24 hours 09/18/16 1109 09/19/16 1937   09/17/16 1800  ceFEPIme (MAXIPIME) 2 g in dextrose 5 % 50 mL IVPB  Status:  Discontinued     2 g 100 mL/hr over 30 Minutes Intravenous Every 24 hours 09/17/16 1243 09/21/16 1200   09/17/16 1800  fluconazole (DIFLUCAN) IVPB 200 mg  Status:  Discontinued     200 mg 100 mL/hr over 60 Minutes Intravenous Every 24 hours 09/17/16 1651 09/18/16 1109   09/12/16 1400  ceFEPIme (MAXIPIME) 1 g in dextrose 5 % 50 mL IVPB  Status:  Discontinued     1 g 100 mL/hr over 30 Minutes Intravenous Every 8 hours 09/12/16 1127 09/17/16 1243   09/11/16 1600  metroNIDAZOLE (FLAGYL) IVPB 500 mg  Status:  Discontinued     500 mg 100 mL/hr over 60 Minutes Intravenous Every 8 hours 09/11/16 0902 09/21/16 1200   09/11/16 1400  ceFEPIme (MAXIPIME) 1 g in dextrose 5 % 50 mL IVPB  Status:  Discontinued     1 g 100 mL/hr over 30 Minutes Intravenous Every 24 hours 09/11/16 1234 09/12/16 1127   09/10/16 2000  vancomycin (VANCOCIN) IVPB 1000 mg/200 mL premix  Status:  Discontinued     1,000 mg 200 mL/hr over 60 Minutes Intravenous Every 24 hours 09/05/2016 2130 09/10/16 1616   09/10/16 1800  vancomycin (VANCOCIN) IVPB 750 mg/150 ml premix  Status:  Discontinued     750 mg 150 mL/hr over 60 Minutes  Intravenous Every 12 hours 09/10/16 1616 09/12/16 1145   09/10/16 1400  levofloxacin (LEVAQUIN) IVPB 500 mg  Status:  Discontinued     500 mg 100 mL/hr over 60 Minutes Intravenous Every 24 hours 09/05/2016 2112 09/11/16 1220   09/10/16 0115  vancomycin (VANCOCIN) IVPB 1000 mg/200 mL premix  Status:  Discontinued     1,000 mg 200 mL/hr over 60 Minutes Intravenous  Once 09/10/16 0110 09/10/16 0118   09/05/2016 2200  metroNIDAZOLE (FLAGYL) IVPB 500 mg  Status:  Discontinued     500 mg 100 mL/hr over 60 Minutes Intravenous Every 6 hours 09/23/2016 2110 09/11/16 0902   09/03/2016 2115  vancomycin (VANCOCIN) IVPB 1000 mg/200 mL premix     1,000 mg 200 mL/hr over 60 Minutes Intravenous  Once 09/24/2016 2108 09/13/2016 2323   09/06/2016 1430  levofloxacin (LEVAQUIN) IVPB 750 mg     750 mg 100 mL/hr over 90 Minutes Intravenous  Once 09/06/2016 1415 09/15/2016 2152   09/06/2016 1430  metroNIDAZOLE (FLAGYL) IVPB 500 mg     500 mg 100 mL/hr over 60 Minutes Intravenous  Once 09/04/2016 1415 09/18/2016 1850   10/02/2016 1430  levofloxacin (LEVAQUIN) IVPB 750 mg  Status:  Discontinued     750 mg 100 mL/hr over 90 Minutes Intravenous  Once 09/20/2016 1416 09/05/2016 1416       Assessment/Plan Sepsis due to candidemia Splenic infarcts CHF Prolonged QTC Severe protein calorie malnutrition Anemia Depression Respiratory failure/Acute pulmonary edema  SBO - CT scan 9/11 showed early or partial small bowel obstruction with transition zone in the right lower quadrant - NG output100/24hr - +flatus and BM x3  FEN - IVF, clamp NG tube and give sips of clear liquids VTE - SCDs, no chemical DVT prophylaxis due to thrombocytopenia Foley - in place Follow up - TBD  Plan - NG tube output decreased, +flatus and BM. Clamp NG tube and give sips of clear liquids. Encourage OOB today.   LOS: 36 days  Wellington Hampshire , [facility] Surgery 10/15/2016, 7:32 AM Pager: [phone] Consults: [phone] Mon-Fri 7:00  am-4:30 pm Sat-Sun 7:00 am-11:30 am

## 2016-10-15 NOTE — Progress Notes (Signed)
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 She presented with weakness and met sepsis criteria initially with concern for enteritis.  She grew a yeast UTI and on 8/18 1/2 blood cultures grew yeast and the patient was started on eraxis.  She was switched to fluconazole and antibiotics were d/c'd on 8/20 with blood cultures only growing candida.  ID recommended continuing fluconazole 400 mg daily through 9/26 and recommended ophtho exam within the next month.     She had an echo on 8/28 which showed new systolic dysfunction and her QT was markedly prolonged so she was changed back from fluconazole to Eraxis until 9/26.  Cardiology was consulted and she was diuresed.   On 9/10 she developed new nausea and vomiting.  KUB showed concern for a SBO.  CT confirmed this and also showed evidence of possible enteritis.  Repeat blood cultures obtained and started on ceftriaxone/flagyl.   Given severe malnutrition will need to consider TPN for Haley Graham and won't be able to place PICC until blood Cx are Negative x 3 days per ID reccs.   On 9/12 ECHO was repeated and was essentially the same; Deferring to Cardiology for further invasive procedures such as angiography and likely to be done early next if stable  Patient had Rapid Response overnight and ended up on BiPAP and being diuresed. Improved but still confused and delirious.   Assessment & Plan:   Principal Problem:   Candidemia ([place]) Active Problems:  GERD (gastroesophageal reflux disease)   Dissection of thoracoabdominal aorta (HCC)   AAA (abdominal aortic aneurysm) (HCC)   Benign essential HTN   Diabetes mellitus type 2 in obese (HCC)   Anxiety state   Hyponatremia   Leukocytosis   Sepsis (HCC)   Hypotension   AKI (acute kidney injury) ([place])   Dehydration   Generalized weakness   Anemia   Thrombocytopenia (HCC)   Abnormal computed tomography angiography (CTA) of abdomen and pelvis   Colitis: Probable   Protein-calorie malnutrition, severe   Palliative care by specialist   Adult failure to thrive   Hypophosphatemia   Hypomagnesemia   Depression   Acute systolic (congestive) heart failure ([place])   DNR (do not resuscitate) discussion   Congestive heart failure (HCC)   Heme positive stool   Cardiac LV ejection fraction 30-35%   QT prolongation   Acute hypercapnic respiratory failure (HCC)   Metabolic encephalopathy   Pulmonary edema cardiac cause (HCC)  Acute Hypercapnic Respiratory Failure Requiring NIPPV -Decompensated overnight and ended up on BiPAP -Given Aggressive Diuresis with IV Lasix -PCCM Evaluated and Improved   -Repeat ABG improved -Transitioned to [place]; -C/w NIPPV with BiPAP qHS and prn  Small Bowel Obstruction  Enteritis?:   - Surgery following, SB, protocol, KUB this morning showed no convincing radiographic improvement in apparent mid/distal small bowel obstruction. Nasogastric tube traverses decompressed stomach. -C/w ceftriaxone/flagyl for evidence of ? enteritis, although with her history, may be related to mesenteric ischemia. repeat blood cx as well due to tachy, but normal WBC, without fevers. Low Threshold to de-escalate  - D5 NS at 75 mL/hr ordered x  3 days stopped as patient went into volume overload last night -Surgery allowing Clears with Sips as NGT stopped putting out.  NGT Clamped -Possible Removal of NGT in AM   Sepsis due to Candidemia, improved  -Sepsis Physiology improved  -  discussed with ID, ID to see, switched to eraxis (caspofungin not on formulary) after discussion with ID -> continue until 9/26  -Case was discussed with Dr. Valetta Close 9/7 who recommended follow up after discharge for ophthalmologic exam -Some concern for thrush, ctm on eraxis, she's been on fluconazole  -Per ID continue Anidulafungin for 6 weeks; PICC to be placed after recent cultures 9/11 NG x at least 72h. Will need double lumen for TNA + prolonged antifungal course -PICC placed today at the recommendation of ID; Will likely start TPN  Splenic Infarcts:   -Unclear cause, new since previous study per rads.   -Possibly related to thrombosis or dissection within aorta, though thrombosis is infrarenal and celiac comes off true lumen.    Acute systolic congestive heart failure:  - Holding lasix 20 mg PO daily, with tachycardia/N/V with above SBO -Repeat echo with EF 35-40% and grade 2 diastolic dysfunction as well as hypokinesis of mid-apicalanteroseptal, anterior, and inferolateral myocardium (mildly improved from 8/28 echo at 30-35%) -Follow up cardiology recs Cardiology rec f/u in clinic in 4-6 weeks with repeat echo -> Cards repeated ECHO 10/15/11 which was essentially unchanged -May needLexiscan Myoview versus cath but will defer to Cardiology.Denied shortness of breath or chest pain today. Continue to monitor urine output and electrolytes. Cardiology consult appreciated. -On beta blocker, consider ace/arb in future -Cardiology would potentially consider invasive angiography and likely compelled to proceed with Coronary Angiography to clarify diagnosis as it is less likely stress cardiomyopathy and likely LAD stenosis and ischemia. -Patient unstable for Cardiac Cath at this time and it is tentatively scheduled for early next week; Cards recommending implantation of Bare-metal Stent  Prolonged QTC: Over 600 initially and improved.   -D/c fluconazole.  D/c tramadol and hold cymbalta.  Zofran  discontinued.  -Discontinue Azithromycin and abilify.  Monitor in telemetry. Repeat EKG.  Tachycardia:  -likely 2/2 SBO/N/V above.  Sinus tach on EKG.  Will continue to monitor. Gentle IVF.  Bolus 250 cc and repeat prn if tolerated well.   Severe protein calorie malnutrition:  -Nutritionist Consulted -Cortrak dc'd.  Will need to consider TPN again.  Discussed this with husband and cannot place PICC for TPN until Blood Cx Negative at least 72 hours  Acute kidney Injury  -Improved -Continue to Monitor CMP's  Acute on Chronic anemia likely due to chronic disease vs acute GI bleed:  -Iron stores acceptable. Fecal occult blood test positive. On Protonix. Status post 2 unit of red blood cell transfusion on 8/30 with improvement in hemoglobin. Evaluated by GI. No further endoscopy at this time. -Hemoglobin level stable. -Monitor CBC.  Thrombocytopenia likely due to acute illness:  -Monitor CBC. Platelet count up and down over past few days.  -Continue to Monitor; Platelet Count was 136  Depression and Anxiety:  -Evaluated by psychiatrist.   -Continue Xanax, holding cymbalta, andVyvanse per psychiatrist  Hypomagnesemia -Mag was 1.6, -Replete -Repeat Mag Level in AM  Loose stools:  -Negative C. Diff; Possibly from SBO  Acute Delirium:  -C/w Delirium precautions -Continue to Redirect  -Continue to try to treat underlying causes  Hypokalemia -Patient's K+ was 2.7 and improved to 3.4 -Replete -Continue to Monitor  Goals of care:  -Palliative follow up with patient/family yesteday. Had  a discussion with Husband about Mrs. Alvarenga.  Discussed that she was extremely sick and that it is hard to predict when these new complications would resolve (both asked about when she might be able to get to the SNF). -Discussed need for TPN again as well and will consider starting in AM.    DVT prophylaxis: SCDs Code Status: FULL CODE Family Communication: Discussed with Husband over  the phone Disposition Plan: SNF when medically Stable   Consultants:   Cardiology  Psychiatry  Infectious Diseases  Palliative Care Medicine  Gastroenterology  General Surgery  Nutrition  Discussed Case with Vascular   PCCM   Procedures:   Thoracentesis 8/17  Echo 8/28 EF 30-35%, akinesis of mid-apicalanteroseptal and anterior myocardium. Diastolic dysfunction.  Echo 8/13 EF 48-18, diastolic dysfunction, lipomatous hypertrophy  Echo 9/4 EF 35-40%, hypokinesis of mid apicalanteroseptal, anterior, and inferolateral myocardium, grade 2 diastolic dysfunction -Echo 9/12 There was mild concentric hypertrophy. Systolic function was moderately reduced. The estimated ejection fraction was in the range of 35% to 40%. There is akinesis of the mid-apicalanteroseptal, anterior,   anterolateral, lateral, and apical myocardium. The study is not technically sufficient to allow evaluation of LV diastolic   Antimicrobials:  Anti-infectives    Start     Dose/Rate Route Frequency Ordered Stop   10/14/16 1500  anidulafungin (ERAXIS) 100 mg in sodium chloride 0.9 % 100 mL IVPB     100 mg 78 mL/hr over 100 Minutes Intravenous Every 24 hours 10/13/16 1018 11/25/16 1459   10/13/16 1100  cefTRIAXone (ROCEPHIN) 2 g in dextrose 5 % 50 mL IVPB - Premix     2 g 100 mL/hr over 30 Minutes Intravenous Every 24 hours 10/13/16 1016     10/13/16 1100  metroNIDAZOLE (FLAGYL) IVPB 500 mg     500 mg 100 mL/hr over 60 Minutes Intravenous Every 8 hours 10/13/16 1016     10/13/16 1030  anidulafungin (ERAXIS) 200 mg in sodium chloride 0.9 % 200 mL IVPB     200 mg 78 mL/hr over 200 Minutes Intravenous  Once 10/13/16 1018 10/13/16 2052   09/27/16 2200  erythromycin 250 mg in sodium chloride 0.9 % 100 mL IVPB  Status:  Discontinued     250 mg 100 mL/hr over 60 Minutes Intravenous Every 8 hours 09/27/16 1842 09/27/16 1959   09/27/16 2200  erythromycin (EES) 400 MG/5ML suspension 250 mg  Status:   Discontinued     250 mg Oral Every 8 hours 09/27/16 1959 09/29/16 1147   09/24/16 1300  fluconazole (DIFLUCAN) tablet 400 mg  Status:  Discontinued     400 mg Oral Daily 09/24/16 1138 10/13/16 0953   09/21/16 1400  fluconazole (DIFLUCAN) IVPB 400 mg  Status:  Discontinued     400 mg 100 mL/hr over 120 Minutes Intravenous Every 24 hours 09/20/16 1220 09/20/16 1221   09/21/16 0000  anidulafungin (ERAXIS) 50 mg in sodium chloride 0.9 % 50 mL IVPB  Status:  Discontinued     50 mg 78 mL/hr over 50 Minutes Intravenous Every 24 hours 09/19/16 0345 09/19/16 0346   09/20/16 1400  fluconazole (DIFLUCAN) IVPB 800 mg  Status:  Discontinued     800 mg 200 mL/hr over 120 Minutes Intravenous  Once 09/20/16 1220 09/20/16 1221   09/20/16 1400  fluconazole (DIFLUCAN) IVPB 400 mg  Status:  Discontinued     400 mg 100 mL/hr over 120 Minutes Intravenous Every 24 hours 09/20/16 1221 09/24/16 1137   09/19/16 2200  anidulafungin (ERAXIS)  100 mg in sodium chloride 0.9 % 100 mL IVPB  Status:  Discontinued     100 mg 78 mL/hr over 100 Minutes Intravenous Every 24 hours 09/19/16 0345 09/20/16 1220   09/19/16 0400  anidulafungin (ERAXIS) 200 mg in sodium chloride 0.9 % 200 mL IVPB     200 mg 78 mL/hr over 200 Minutes Intravenous  Once 09/19/16 0345 09/19/16 0819   09/18/16 1800  fluconazole (DIFLUCAN) IVPB 100 mg  Status:  Discontinued     100 mg 50 mL/hr over 60 Minutes Intravenous Every 24 hours 09/18/16 1109 09/19/16 1937   09/17/16 1800  ceFEPIme (MAXIPIME) 2 g in dextrose 5 % 50 mL IVPB  Status:  Discontinued     2 g 100 mL/hr over 30 Minutes Intravenous Every 24 hours 09/17/16 1243 09/21/16 1200   09/17/16 1800  fluconazole (DIFLUCAN) IVPB 200 mg  Status:  Discontinued     200 mg 100 mL/hr over 60 Minutes Intravenous Every 24 hours 09/17/16 1651 09/18/16 1109   09/12/16 1400  ceFEPIme (MAXIPIME) 1 g in dextrose 5 % 50 mL IVPB  Status:  Discontinued     1 g 100 mL/hr over 30 Minutes Intravenous Every 8  hours 09/12/16 1127 09/17/16 1243   09/11/16 1600  metroNIDAZOLE (FLAGYL) IVPB 500 mg  Status:  Discontinued     500 mg 100 mL/hr over 60 Minutes Intravenous Every 8 hours 09/11/16 0902 09/21/16 1200   09/11/16 1400  ceFEPIme (MAXIPIME) 1 g in dextrose 5 % 50 mL IVPB  Status:  Discontinued     1 g 100 mL/hr over 30 Minutes Intravenous Every 24 hours 09/11/16 1234 09/12/16 1127   09/10/16 2000  vancomycin (VANCOCIN) IVPB 1000 mg/200 mL premix  Status:  Discontinued     1,000 mg 200 mL/hr over 60 Minutes Intravenous Every 24 hours 09/08/2016 2130 09/10/16 1616   09/10/16 1800  vancomycin (VANCOCIN) IVPB 750 mg/150 ml premix  Status:  Discontinued     750 mg 150 mL/hr over 60 Minutes Intravenous Every 12 hours 09/10/16 1616 09/12/16 1145   09/10/16 1400  levofloxacin (LEVAQUIN) IVPB 500 mg  Status:  Discontinued     500 mg 100 mL/hr over 60 Minutes Intravenous Every 24 hours 10/01/2016 2112 09/11/16 1220   09/10/16 0115  vancomycin (VANCOCIN) IVPB 1000 mg/200 mL premix  Status:  Discontinued     1,000 mg 200 mL/hr over 60 Minutes Intravenous  Once 09/10/16 0110 09/10/16 0118   10/01/2016 2200  metroNIDAZOLE (FLAGYL) IVPB 500 mg  Status:  Discontinued     500 mg 100 mL/hr over 60 Minutes Intravenous Every 6 hours 09/21/2016 2110 09/11/16 0902   09/25/2016 2115  vancomycin (VANCOCIN) IVPB 1000 mg/200 mL premix     1,000 mg 200 mL/hr over 60 Minutes Intravenous  Once 09/19/2016 2108 09/21/2016 2323   09/26/2016 1430  levofloxacin (LEVAQUIN) IVPB 750 mg     750 mg 100 mL/hr over 90 Minutes Intravenous  Once 09/23/2016 1415 09/15/2016 2152   09/07/2016 1430  metroNIDAZOLE (FLAGYL) IVPB 500 mg     500 mg 100 mL/hr over 60 Minutes Intravenous  Once 10/01/2016 1415 09/24/2016 1850   09/16/2016 1430  levofloxacin (LEVAQUIN) IVPB 750 mg  Status:  Discontinued     750 mg 100 mL/hr over 90 Minutes Intravenous  Once 09/10/2016 1416 09/11/2016 1416     Subjective: Seen and examined at bedside and was severely confused and  delirous. Unable to provide a subjective Hx and just wanted to  speak to her Janine Limbo. Had a Rapid Response overnight. Husband Updated about Plan of Care via Phone.   Objective: Vitals:   10/15/16 1521 10/15/16 1952 10/15/16 2002 10/15/16 2014  BP: 138/88     Pulse: (!) 105 (!) 106 (!) 107 (!) 105  Resp: 18 20 (!) 22   Temp: 97.7 F (36.5 C)   98.5 F (36.9 C)  TempSrc: Oral   Axillary  SpO2:  100%    Weight:      Height:        Intake/Output Summary (Last 24 hours) at 10/15/16 2109 Last data filed at 10/15/16 1907  Gross per 24 hour  Intake              500 ml  Output             2750 ml  Net            -2250 ml   Filed Weights   10/12/16 0400 10/13/16 0400 10/14/16 0400  Weight: 69.9 kg (154 lb) 69.4 kg (153 lb 1.6 oz) 69.9 kg (154 lb 3.2 oz)   Examination: Physical Exam:  Constitutional: Confused and delirious obese Caucasian female in NAD Eyes: Pupils Dilated. Lids Normal ENMT: Grossly normal hearing Neck: Supple with no appreciable JVD Respiratory: CTAB; No wheezing/rales/rhonchi Cardiovascular: Tachycardic rate and rhythm. 1+ LE Edema Abdomen: Soft, NT, ND. Bowel sounds diminished  GU: Deferred Musculoskeletal: No contractures; No Cyanosis Skin: Diffuse Ecchymosis on upper chest and LE extremities Neurologic: CN 2-12 grossly intact Psychiatric: Confused. Impaired judgement and insight  Data Reviewed: I have personally reviewed following labs and imaging studies  CBC:  Recent Labs Lab 10/12/16 1647 10/13/16 0337 10/14/16 0339 10/15/16 0301 10/15/16 1946  WBC 12.5* 8.3 7.1 12.7* 12.4*  NEUTROABS  --   --   --   --  9.4*  HGB 10.0* 9.2* 10.5* 9.8* 8.1*  HCT 30.8* 27.5* 31.9* 31.1* 25.7*  MCV 95.4 95.5 97.6 97.2 98.1  PLT 109* 101* 136* 177 599*   Basic Metabolic Panel:  Recent Labs Lab 10/11/16 0629 10/12/16 1439 10/13/16 0337 10/14/16 0339 10/15/16 0301 10/15/16 1912  NA 137 135 136 140 141 142  K 4.4 5.0 4.5 3.9 2.7* 3.4*  CL 105 104  109 113* 114* 114*  CO2 25 22 20* 22 20* 24  GLUCOSE 124* 122* 91 104* 156* 100*  BUN '18 17 16 13 10 10  ' CREATININE 0.70 0.76 0.72 0.81 0.84 0.87  CALCIUM 7.1* 7.5* 7.0* 6.9* 7.3* 7.1*  MG 2.2 1.7 1.6* 2.3  --  1.6*  PHOS  --   --   --   --   --  3.4   GFR: Estimated Creatinine Clearance: 64.5 mL/min (by C-G formula based on SCr of 0.87 mg/dL). Liver Function Tests:  Recent Labs Lab 10/11/16 0629 10/13/16 0337 10/14/16 0339 10/15/16 0301 10/15/16 1912  AST '22 23 24 23 20  ' ALT '16 16 16 16 15  ' ALKPHOS 318* 302* 305* 368* 345*  BILITOT 0.5 0.6 0.5 0.7 0.4  PROT 4.4* 4.4* 4.8* 5.5* 4.9*  ALBUMIN 1.1* 1.1* 1.3* 1.4* 1.3*   No results for input(s): LIPASE, AMYLASE in the last 168 hours. No results for input(s): AMMONIA in the last 168 hours. Coagulation Profile: No results for input(s): INR, PROTIME in the last 168 hours. Cardiac Enzymes:  Recent Labs Lab 10/15/16 0301 10/15/16 1912  TROPONINI 0.05* 0.07*   BNP (last 3 results) No results for input(s): PROBNP in the last  8760 hours. HbA1C: No results for input(s): HGBA1C in the last 72 hours. CBG:  Recent Labs Lab 10/14/16 0602 10/14/16 1623 10/14/16 2100 10/15/16 0638 10/15/16 1651  GLUCAP 118* 119* 106* 115* 95   Lipid Profile: No results for input(s): CHOL, HDL, LDLCALC, TRIG, CHOLHDL, LDLDIRECT in the last 72 hours. Thyroid Function Tests: No results for input(s): TSH, T4TOTAL, FREET4, T3FREE, THYROIDAB in the last 72 hours. Anemia Panel: No results for input(s): VITAMINB12, FOLATE, FERRITIN, TIBC, IRON, RETICCTPCT in the last 72 hours. Sepsis Labs:  Recent Labs Lab 10/15/16 0301  LATICACIDVEN 1.2    Recent Results (from the past 240 hour(s))  C difficile quick screen w PCR reflex     Status: None   Collection Time: 10/07/16 11:58 AM  Result Value Ref Range Status   C Diff antigen NEGATIVE NEGATIVE Final   C Diff toxin NEGATIVE NEGATIVE Final   C Diff interpretation No C. difficile detected.   Final  Culture, blood (routine x 2)     Status: None (Preliminary result)   Collection Time: 10/13/16  6:58 PM  Result Value Ref Range Status   Specimen Description BLOOD LEFT HAND  Final   Special Requests IN PEDIATRIC BOTTLE Blood Culture adequate volume  Final   Culture NO GROWTH 2 DAYS  Final   Report Status PENDING  Incomplete  Culture, blood (routine x 2)     Status: None (Preliminary result)   Collection Time: 10/13/16  7:21 PM  Result Value Ref Range Status   Specimen Description BLOOD RIGHT HAND  Final   Special Requests IN PEDIATRIC BOTTLE Blood Culture adequate volume  Final   Culture NO GROWTH 2 DAYS  Final   Report Status PENDING  Incomplete   Radiology Studies: Dg Chest Port 1 View  Result Date: 10/15/2016 CLINICAL DATA:  Respiratory distress tonight. EXAM: PORTABLE CHEST 1 VIEW COMPARISON:  Single-view of the chest 09/30/2016. FINDINGS: NG tube is in place. Aortic stent graft is noted. There is bilateral mid and lower lung zone airspace disease and left greater than right pleural effusions. Interstitial pulmonary edema is identified. No pneumothorax. IMPRESSION: Interstitial pulmonary edema with left worse than right pleural effusions. Worsened bibasilar airspace disease which could be due to atelectasis or pneumonia. Electronically Signed   By: Inge Rise M.D.   On: 10/15/2016 00:51   Dg Abd Portable 1v  Result Date: 10/14/2016 CLINICAL DATA:  Small bowel obstruction EXAM: PORTABLE ABDOMEN - 1 VIEW COMPARISON:  10/13/2016 FINDINGS: Nasogastric tube extends to the proximal duodenum. The stomach is decompressed. Multiple dilated small bowel loops processed in the left upper mid abdomen, with little change in degree of dilatation in number of loops. The colon remains decompressed with residual contrast material in the distal sigmoid and rectum. Surgical clips right upper abdomen and overlying right femoral head. IMPRESSION: 1. No convincing radiographic improvement in  apparent mid/distal small bowel obstruction. 2. Nasogastric tube traverses decompressed stomach. Electronically Signed   By: Lucrezia Europe M.D.   On: 10/14/2016 08:12   Scheduled Meds: . ALPRAZolam  0.25 mg Oral QHS  . aspirin EC  81 mg Oral Daily  . dronabinol  5 mg Oral BID AC  . feeding supplement (ENSURE ENLIVE)  237 mL Oral TID BM  . insulin aspart  0-5 Units Subcutaneous QHS  . insulin aspart  0-9 Units Subcutaneous TID WC  . lisdexamfetamine  20 mg Oral Daily  . metoprolol tartrate  7.5 mg Intravenous Q6H  . scopolamine  1 patch Transdermal  Q72H  . sodium chloride flush  10-40 mL Intracatheter Q12H  . sodium chloride flush  3 mL Intravenous Q12H   Continuous Infusions: . anidulafungin Stopped (10/15/16 1743)  . cefTRIAXone (ROCEPHIN)  IV Stopped (10/15/16 1634)  . famotidine (PEPCID) IV Stopped (10/15/16 1328)  . metronidazole Stopped (10/15/16 1914)    LOS: 37 days   Kerney Elbe, DO [facility] Pager [phone]  If 7PM-7AM, please contact night-coverage [email or web address] Password [email or web address] 10/15/2016, 9:09 PM

## 2016-10-15 NOTE — Consult Note (Signed)
Name: Haley Graham MRN: [id number] DOB: [birth year]    ADMISSION DATE:  09/14/2016 CONSULTATION DATE:  10/15/16  REFERRING MD :  Alfredia Ferguson  CHIEF COMPLAINT:  AMS   HISTORY OF PRESENT ILLNESS:  Haley Graham is a 61 y.o. female with a PMH as outlined below.  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 She was readmitted 09/06/2016 and was found to have candidemia. She was subsequently started on Eraxis which was later swapped to fluconazole with additional antibiotics stopped given negative blood cultures. ID had recommended continuing fluconazole through 9/26 along with ophthalmology exam in the next month. She had echo on 8/28 that showed new systolic heart failure with QT prolongation; (EF 30-35%, G1 DD) therefore, fluconazole was changed back to the Eraxis. 9/10 she developed nausea and vomiting. KUB was concerning for an SBO. CT was subsequently obtained and confirmed these findings along with possible enteritis. She was subsequent restarted on ceftriaxone and Flagyl. Surgery had recommended TPN due to severe malnutrition; however, PICC has been deferred until blood cultures remain negative 3.  During early a.m. hours of 10/15/16, patient had hypersomnolence. ABG was obtained and demonstrated hypercapnic respiratory failure (7.12/68/177).  He was subsequently started on BiPAP and PCCM was consulted. CXR was obtained and demonstrated pulmonary edema. She was given 40 mg of Lasix and had good urine output. Several minutes later after starting BiPAP, patient became more arousable. At the time of our exam, she is fully awake and able to cooperate and answer questions. She remains on BiPAP. She is to receive an additional 40 mg of Lasix now.  He has repeat ABG pending for 2:45 AM.  PAST MEDICAL HISTORY :   has a past medical history of Anxiety; Arthritis; Diabetes mellitus  without complication ([place]); Gastroparesis; GERD (gastroesophageal reflux disease); Hypertension; and IBS (irritable bowel syndrome).  has a past surgical history that includes Abdominal hysterectomy; Colonoscopy; Cholecystectomy; Upper gastrointestinal endoscopy; Cesarean section; Colonoscopy (N/A, 06/10/2016); polypectomy (06/10/2016); Aortogram (N/A, 07/24/2016); Thoracic aortic endovascular stent graft (N/A, 08/06/2016); and IR THORACENTESIS ASP PLEURAL SPACE W/IMG GUIDE (09/18/2016). Prior to Admission medications   Medication Sig Start Date End Date Taking? Authorizing Provider  ALPRAZolam Duanne Moron) 0.5 MG tablet Take 0.5-1 tablets (0.25-0.5 mg total) by mouth 2 (two) times daily as needed for anxiety. 08/22/16  Yes Gold, Wayne E, PA-C  alum & mag hydroxide-simeth (MAALOX/MYLANTA) 200-200-20 MG/5ML suspension Take 30 mLs by mouth 2 (two) times daily as needed for indigestion or heartburn.   Yes [provider]  aspirin EC 81 MG EC tablet Take 1 tablet (81 mg total) by mouth daily. 08/22/16  Yes Gold, Wayne E, PA-C  citalopram (CELEXA) 40 MG tablet Take 40 mg by mouth at bedtime.    Yes [provider]  clonazePAM (KLONOPIN) 1 MG tablet Take 1 tablet (1 mg total) by mouth at bedtime. Patient taking differently: Take 1 mg by mouth at bedtime as needed (for sleep).  08/22/16  Yes Gold, Wayne E, PA-C  fluticasone (FLONASE) 50 MCG/ACT nasal spray Place 2 sprays into both nostrils daily as needed for allergies or rhinitis.  05/17/16  Yes [provider]  glipiZIDE-metformin (METAGLIP) 5-500 MG tablet Take 0.5 tablets by mouth 2 (two) times daily before a meal. 08/22/16  Yes Gold, Wayne E, PA-C  metoprolol tartrate (LOPRESSOR) 25 MG tablet Take 25 mg by mouth 2 (two) times daily.   Yes [provider]  mometasone-formoterol (DULERA) 200-5 MCG/ACT AERO Inhale 2 puffs into the lungs 2 (two) times daily. 08/22/16  Yes Gold, Wayne E, PA-C  ondansetron (ZOFRAN) 4 MG tablet Take 4 mg by  mouth every 8 (eight) hours as needed for nausea or vomiting.   Yes [provider]  pantoprazole (PROTONIX) 40 MG tablet Take 40 mg by mouth daily as needed (acid reflux/ indigestion).  11/12/11  Yes Rehman, Mechele Dawley, MD  Probiotic Product (PROBIOTIC PO) Take 1 tablet by mouth at bedtime. Gut Restore Ultimate (probiotic with zinc and vitamin C)   Yes [provider]  traMADol (ULTRAM) 50 MG tablet Take 1 tablet (50 mg total) by mouth every 6 (six) hours as needed. Patient taking differently: Take 25 mg by mouth every 6 (six) hours as needed for moderate pain.  09/01/16  Yes Ivin Poot, MD  Metoprolol Tartrate 37.5 MG TABS Take 37.5 mg by mouth 2 (two) times daily. Patient not taking: Reported on 09/30/2016 08/22/16   Jadene Pierini E, PA-C  oxyCODONE 10 MG TABS Take 1 tablet (10 mg total) by mouth 2 (two) times daily as needed for severe pain. Patient not taking: Reported on 09/14/2016 08/22/16   John Giovanni, PA-C   Allergies  Allergen Reactions  . Penicillins Rash and Other (See Comments)    PATIENT HAS HAD A PCN REACTION WITH IMMEDIATE RASH, FACIAL/TONGUE/THROAT SWELLING, SOB, OR LIGHTHEADEDNESS WITH HYPOTENSION:  #  #  #  YES  #  #  #   Has patient had a PCN reaction causing severe rash involving mucus membranes or skin necrosis: no Has patient had a PCN reaction that required hospitalization no Has patient had a PCN reaction occurring within the last 10 years:no If all of the above answers are "NO", then may proceed with Cephalosporin use.  . Ativan [Lorazepam] Other (See Comments)    Makes pt very confused and more agitated, irritable.  . Codeine Nausea And Vomiting  . Reglan [Metoclopramide] Other (See Comments)    TACHYCARDIA    FAMILY HISTORY:  family history includes Cancer in her mother; Heart attack in her mother; Stroke in her father and mother. SOCIAL HISTORY:  reports that she has been smoking Cigarettes.  She has a 17.25 pack-year smoking history. She has  never used smokeless tobacco. She reports that she does not drink alcohol or use drugs.  REVIEW OF SYSTEMS:  Unable to obtain as patient is on BiPAP.   SUBJECTIVE: Awake, nods head appropriately, follows simple commands.  VITAL SIGNS: Temp:  [97.5 F (36.4 C)-97.9 F (36.6 C)] 97.5 F (36.4 C) (09/12 2000) Pulse Rate:  [89-107] 107 (09/13 0104) Resp:  [19-24] 22 (09/13 0104) BP: (126-144)/(54-85) 144/85 (09/12 2000) SpO2:  [97 %-100 %] 100 % (09/13 0104) Weight:  [69.9 kg (154 lb 3.2 oz)] 69.9 kg (154 lb 3.2 oz) (09/12 0400)  PHYSICAL EXAMINATION: General: Chronically ill appearing female, resting in bed, in NAD. Neuro: A&O x 3, non-focal.  HEENT: [place]/AT. PERRL, sclerae anicteric. Cardiovascular: RRR, no M/R/G.  Lungs: Respirations even and unlabored on BiPAP.  Faint crackles bilaterally. Abdomen: BS x 4, soft, NT/ND.  Musculoskeletal: No gross deformities, 1+edema.  Skin: Ecchymosis throughout chest and upper arms, skin warm, no rashes.     Recent Labs Lab 10/12/16 1439 10/13/16 0337 10/14/16 0339  NA 135 136 140  K 5.0 4.5 3.9  CL 104 109 113*  CO2 22 20* 22  BUN 17 16 13   CREATININE 0.76 0.72 0.81  GLUCOSE 122*  91 104*    Recent Labs Lab 10/12/16 1647 10/13/16 0337 10/14/16 0339  HGB 10.0* 9.2* 10.5*  HCT 30.8* 27.5* 31.9*  WBC 12.5* 8.3 7.1  PLT 109* 101* 136*   Ct Abdomen Pelvis W Contrast  Result Date: 10/13/2016 CLINICAL DATA:  Abdominal pain, nausea, and vomiting. History of gastroesophageal reflux disease, gastroparesis, diabetes. EXAM: CT ABDOMEN AND PELVIS WITH CONTRAST TECHNIQUE: Multidetector CT imaging of the abdomen and pelvis was performed using the standard protocol following bolus administration of intravenous contrast. CONTRAST:  165mL ISOVUE-300 IOPAMIDOL (ISOVUE-300) INJECTION 61% COMPARISON:  09/17/2016, 07/18/2016 FINDINGS: Lower chest: Small to moderate bilateral pleural effusions with basilar atelectasis or consolidation bilaterally,  greater on the left. Dissection of the descending thoracic aorta with stent in the true lumen. Hepatobiliary: Mild diffuse fatty infiltration of the liver. No focal lesions identified. Surgical absence of the gallbladder. No bile duct dilatation. Pancreas: Unremarkable. No pancreatic ductal dilatation or surrounding inflammatory changes. Spleen: Wedge-shaped filling defects in the inferior spleen likely to represent splenic infarcts. Adrenals/Urinary Tract: 2.5 cm diameter heterogeneously enhancing adrenal gland nodule. No significant change since previous study. No other right adrenal gland nodules. Diffuse parenchymal atrophy and scarring of the left kidney. Heterogeneous nephrogram on the left may be due to infarcts or pyelonephritis. Right kidney is unremarkable. No hydronephrosis or hydroureter. The bladder is decompressed with a Foley catheter. Gas in the bladder is likely resulting from catheter insertion. Stomach/Bowel: There is an enteric tube with tip in the proximal duodenum. Stomach is mostly decompressed in the gastric wall is not significantly thickened. Small bowel are diffusely dilated and fluid-filled with thickening of the small bowel wall. The terminal ileum is decompressed with transition zone in the right lower quadrant. This suggests distal small bowel obstruction or partial obstruction. Wall thickening may be due to enteritis or inflammatory change. Ischemia not entirely excluded but there is no evidence of pneumatosis or portal venous gas. Visualized mesenteric artery and vein appear patent. Colon is decompressed. Appendix is not seen. Vascular/Lymphatic: Dissection of the abdominal aorta extending from the descending aorta through to the bifurcation. No aneurysm. There is thrombosis of the true lumen beginning at about the level of the renal arteries. Left renal artery arises from the true lumen and appears compromised with probable thrombosis of the left renal artery. Right renal artery  arises from the false lumen and is patent. Celiac axis and inferior mesenteric artery arise from the true lumen and are patent. Inferior mesenteric artery appears occluded. Dissection extends into the iliac arteries bilaterally with thrombosis of the true lumen. False lumen is patent. External and internal iliac arteries appear patent. Vascular calcifications are present. Reproductive: Status post hysterectomy. No adnexal masses. Other: There is a small amount of free fluid in the pelvis, mesenteric, pericolic gutters, and around the liver and spleen. This is likely ascites. Edema in the subcutaneous fat of the abdominal wall. No free air in the abdomen. Loculated fluid collection in the right groin region could represent a hematoma if there is been instrumentation in the groin. This was present previously. Fatty atrophy of the musculature. Musculoskeletal: No destructive bone lesions. IMPRESSION: 1. 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 False lumen is patent. This is unchanged since 07/18/2016 study 2. 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 Appearance is similar to previous study. 3. 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  Appear new since previous study. 4. Mild diffuse abdominal and pelvic ascites. 5. Early or partial small bowel  obstruction with transition zone in the right lower quadrant. Small bowel wall thickening is likely due to enteritis or inflammatory change although ischemia not entirely excluded. No pneumatosis. Similar appearance on prior studies although there has been some progression in the interval. 6. The left adrenal gland nodule is unchanged. 7. Diffuse fatty infiltration of the liver. 8. Bilateral pleural effusions with basilar atelectasis or consolidation. These results were called by telephone at the time of interpretation on 10/13/2016 at 3:30 am to Dr. Chaney Malling , who verbally acknowledged these results. Electronically  Signed   By: Lucienne Capers M.D.   On: 10/13/2016 03:30   Dg Chest Port 1 View  Result Date: 10/15/2016 CLINICAL DATA:  Respiratory distress tonight. EXAM: PORTABLE CHEST 1 VIEW COMPARISON:  Single-view of the chest 09/30/2016. FINDINGS: NG tube is in place. Aortic stent graft is noted. There is bilateral mid and lower lung zone airspace disease and left greater than right pleural effusions. Interstitial pulmonary edema is identified. No pneumothorax. IMPRESSION: Interstitial pulmonary edema with left worse than right pleural effusions. Worsened bibasilar airspace disease which could be due to atelectasis or pneumonia. Electronically Signed   By: Inge Rise M.D.   On: 10/15/2016 00:51   Dg Abd Portable 1v  Result Date: 10/14/2016 CLINICAL DATA:  Small bowel obstruction EXAM: PORTABLE ABDOMEN - 1 VIEW COMPARISON:  10/13/2016 FINDINGS: Nasogastric tube extends to the proximal duodenum. The stomach is decompressed. Multiple dilated small bowel loops processed in the left upper mid abdomen, with little change in degree of dilatation in number of loops. The colon remains decompressed with residual contrast material in the distal sigmoid and rectum. Surgical clips right upper abdomen and overlying right femoral head. IMPRESSION: 1. No convincing radiographic improvement in apparent mid/distal small bowel obstruction. 2. Nasogastric tube traverses decompressed stomach. Electronically Signed   By: Lucrezia Europe M.D.   On: 10/14/2016 08:12   Dg Abd Portable 1v-small Bowel Obstruction Protocol-initial, 8 Hr Delay  Result Date: 10/13/2016 CLINICAL DATA:  Small bowel obstruction. EXAM: PORTABLE ABDOMEN - 1 VIEW COMPARISON:  CT abdomen and pelvis and single view of the abdomen 10/13/2016. FINDINGS: NG tube is unchanged. There is persistent gaseous distention of small bowel with loops up to 4.4 cm identified, not notably changed. IMPRESSION: Unchanged gaseous distention of small bowel consistent small-bowel  obstruction. Electronically Signed   By: Inge Rise M.D.   On: 10/13/2016 20:12   Dg Abd Portable 1v-small Bowel Protocol-position Verification  Result Date: 10/13/2016 CLINICAL DATA:  NG tube placement EXAM: PORTABLE ABDOMEN - 1 VIEW COMPARISON:  CT abdomen and pelvis 10/13/2016 FINDINGS: An enteric tube has been placed with tip in the right upper quadrant consistent with location in the distal stomach or possibly proximal duodenum. Gas-filled dilated small bowel consistent with small bowel obstruction. Small bowel wall thickening may indicate edema or inflammatory change. Residual contrast material in the renal collecting system. Surgical clips in the right upper quadrant. IMPRESSION: Enteric tube tip is in the right upper quadrant consistent with location in the distal stomach. Gaseous distention of small bowel with wall thickening suggesting obstruction and enteritis. Electronically Signed   By: Lucienne Capers M.D.   On: 10/13/2016 05:42    STUDIES:  CXR 9/13 > interstitial edema, bilateral effusions L > R.  SIGNIFICANT EVENTS  8/8 > admit. 8/12 > nephrology consult for edema > started on PO lasix. 8/17 > psych consult for depression. 8/18 > ID consult for fungemia. 8/20 > palliative care consult. 8/29 > cardiology  consult. 8/30 > GI consult. 9/10 > general surgery consult. 9/13 > PCCM consult.   ASSESSMENT / PLAN:  Acute hypercapnic respiratory failure - unclear etiology.  ? Acute pulmonary edema with resultant decreased ventilation. Plan: Continue BiPAP through the night. Repeat ABG at 0245. Hold all sedating meds.  Acute pulmonary edema - s/p 40mg  lasix with good UOP. Plan: Additional 40mg  lasix now.  Candidemia - being followed by ID. Plan: Continue Eraxis per ID recs. PICC once blood cultures from 9/11  neg x 72 hrs.  SBO with possible enteritis - being followed by surgery. Plan: Continue medical management. Continue empiric ceftriaxone / flagyl. Mobilize  as able.  Failure to thrive. Plan: Agree with palliative care for goals of care.   Rest of care per primary team.  Montey Hora, Nellie Pager: [phone]  or [phone] 10/15/2016, 1:53 AM

## 2016-10-16 ENCOUNTER — Inpatient Hospital Stay (HOSPITAL_COMMUNITY): Payer: BLUE CROSS/BLUE SHIELD

## 2016-10-16 DIAGNOSIS — G9340 Encephalopathy, unspecified: Secondary | ICD-10-CM

## 2016-10-16 DIAGNOSIS — J9602 Acute respiratory failure with hypercapnia: Secondary | ICD-10-CM

## 2016-10-16 DIAGNOSIS — R0603 Acute respiratory distress: Secondary | ICD-10-CM

## 2016-10-16 DIAGNOSIS — I429 Cardiomyopathy, unspecified: Secondary | ICD-10-CM

## 2016-10-16 LAB — MAGNESIUM: Magnesium: 1.9 mg/dL (ref 1.7–2.4)

## 2016-10-16 LAB — BLOOD GAS, ARTERIAL
ACID-BASE DEFICIT: 1.8 mmol/L (ref 0.0–2.0)
Bicarbonate: 21.9 mmol/L (ref 20.0–28.0)
DRAWN BY: 10006
O2 CONTENT: 6 L/min
O2 SAT: 97.2 %
Patient temperature: 98.6
pCO2 arterial: 33.8 mmHg (ref 32.0–48.0)
pH, Arterial: 7.428 (ref 7.350–7.450)
pO2, Arterial: 99 mmHg (ref 83.0–108.0)

## 2016-10-16 LAB — COMPREHENSIVE METABOLIC PANEL
ALBUMIN: 1.4 g/dL — AB (ref 3.5–5.0)
ALK PHOS: 441 U/L — AB (ref 38–126)
ALT: 15 U/L (ref 14–54)
AST: 21 U/L (ref 15–41)
Anion gap: 4 — ABNORMAL LOW (ref 5–15)
BUN: 12 mg/dL (ref 6–20)
CALCIUM: 7.4 mg/dL — AB (ref 8.9–10.3)
CO2: 21 mmol/L — AB (ref 22–32)
CREATININE: 0.98 mg/dL (ref 0.44–1.00)
Chloride: 114 mmol/L — ABNORMAL HIGH (ref 101–111)
GFR calc Af Amer: >60 mL/min (ref >60)
GFR calc non Af Amer: >60 mL/min (ref >60)
Glucose, Bld: 114 mg/dL — ABNORMAL HIGH (ref 65–99)
Potassium: 3.7 mmol/L (ref 3.5–5.1)
SODIUM: 139 mmol/L (ref 135–145)
Total Bilirubin: 0.6 mg/dL (ref 0.3–1.2)
Total Protein: 4.9 g/dL — ABNORMAL LOW (ref 6.5–8.1)

## 2016-10-16 LAB — CBC WITH DIFFERENTIAL/PLATELET
BASOS ABS: 0 10*3/uL (ref 0.0–0.1)
Basophils Relative: 0 %
EOS PCT: 0 %
Eosinophils Absolute: 0 10*3/uL (ref 0.0–0.7)
HCT: 27.6 % — ABNORMAL LOW (ref 36.0–46.0)
HEMOGLOBIN: 8.7 g/dL — AB (ref 12.0–15.0)
LYMPHS ABS: 1.9 10*3/uL (ref 0.7–4.0)
LYMPHS PCT: 12 %
MCH: 31 pg (ref 26.0–34.0)
MCHC: 31.5 g/dL (ref 30.0–36.0)
MCV: 98.2 fL (ref 78.0–100.0)
Monocytes Absolute: 0.7 10*3/uL (ref 0.1–1.0)
Monocytes Relative: 5 %
Neutro Abs: 12.8 10*3/uL — ABNORMAL HIGH (ref 1.7–7.7)
Neutrophils Relative %: 83 %
PLATELETS: 169 10*3/uL (ref 150–400)
RBC: 2.81 MIL/uL — AB (ref 3.87–5.11)
RDW: 19.1 % — ABNORMAL HIGH (ref 11.5–15.5)
WBC: 15.5 10*3/uL — ABNORMAL HIGH (ref 4.0–10.5)

## 2016-10-16 LAB — GLUCOSE, CAPILLARY
GLUCOSE-CAPILLARY: 105 mg/dL — AB (ref 65–99)
Glucose-Capillary: 113 mg/dL — ABNORMAL HIGH (ref 65–99)
Glucose-Capillary: 94 mg/dL (ref 65–99)

## 2016-10-16 LAB — PHOSPHORUS: Phosphorus: 3.4 mg/dL (ref 2.5–4.6)

## 2016-10-16 LAB — COOXEMETRY PANEL
CARBOXYHEMOGLOBIN: 0.8 % (ref 0.5–1.5)
Methemoglobin: 1.4 % (ref 0.0–1.5)
O2 Saturation: 64.8 %
Total hemoglobin: 9.6 g/dL — ABNORMAL LOW (ref 12.0–16.0)

## 2016-10-16 MED ORDER — FUROSEMIDE 10 MG/ML IJ SOLN: 40.0000 mg | Freq: Once | INTRAMUSCULAR | Status: AC

## 2016-10-16 MED ORDER — DEXTROSE 5 % IV SOLN
2.0000 g | INTRAVENOUS | Status: DC
  Administered 2016-10-16 – 2016-10-24 (×9): 2 g via INTRAVENOUS
  Filled 2016-10-16 (×10): qty 2

## 2016-10-16 MED ORDER — FUROSEMIDE 10 MG/ML IJ SOLN
40.0000 mg | Freq: Two times a day (BID) | INTRAMUSCULAR | Status: AC
  Administered 2016-10-16 (×2): 40 mg via INTRAVENOUS
  Filled 2016-10-16 (×2): qty 4

## 2016-10-16 MED ORDER — POTASSIUM CHLORIDE 10 MEQ/100ML IV SOLN
10.0000 meq | INTRAVENOUS | Status: AC
  Administered 2016-10-16 (×3): 10 meq via INTRAVENOUS
  Filled 2016-10-16 (×3): qty 100

## 2016-10-16 MED ORDER — MAGNESIUM SULFATE 2 GM/50ML IV SOLN
2.0000 g | Freq: Once | INTRAVENOUS | Status: AC
  Administered 2016-10-16: 2 g via INTRAVENOUS
  Filled 2016-10-16: qty 50

## 2016-10-16 NOTE — Progress Notes (Signed)
Patient placed back on bipap per request. Respiratory followed up to make sure that it was placed correctly.

## 2016-10-16 NOTE — Progress Notes (Addendum)
Patient had incontinent episode with bowels while NP at beside. Patient confused/delirium with high flow oxygen reduced from 6 liters to 5 liters.  Patient resting without agitation or difficulty breathing. Patient on full liquid diet but only one sip of ensure.   Patient drinking less water than yesterday when changed to clear liquid. Will continue to monitor. Payton Emerald, RN

## 2016-10-16 NOTE — Progress Notes (Signed)
I cam to assess the patient as per eICU request. Patient was not in any distress, on nasal canula. She is upset and according to family she has not slept for few days and gets occasionally confused. She does pull her BIPAP off and refuses it occasionally. Diuresing well with lasix and CXR and labs reviewed.  recommended ABG stat, BIPAP when asleep and use haldol if needed for agitation. Can also start Seroquel if needed. AVOID BENZOS.   Patient is stable hemodynamically at the moment and does not need ICU.

## 2016-10-16 NOTE — Progress Notes (Signed)
Patient's husband woke up from his sleep and realized that his wife's oxygen was off and her O2 sats were dropping and heart rate increasing. Patient's husband asked, "who took his wife off of bipap?. It was explained to the husband that it was taken off because the patient was fighting it and trying to pull the bipap off while trying to get out of bed. The husband asked about when they were going to start the TPN so that she can get stronger and have her heart cath. This RN told the patient's husband that they would discuss the TPN this morning. Patient's husband also stated that someone needs to come in and check on her more periodically because of her oxygen being off and her sats dropping. Husband was afraid that she was going to go into flash edema again. Will continue to monitor

## 2016-10-16 NOTE — Progress Notes (Signed)
Patient removed from bipap by RT. Patient pulling on tubing and trying to take it off. Patient placed on high flow 6 liters [place]. Will continue to monitor

## 2016-10-16 NOTE — Progress Notes (Signed)
Patient ID: Haley Graham, female   DOB: 02/13/1955, 61 y.o.   MRN: [id number]  Spoke to patient's bother in law/Joey and encouraged him to have his bother/husband of patient call me if he would like to schedule a meeting or call me with questions or concerns.  Questions and concerns addressed  No charge  Wadie Lessen NP  Palliative Medicine Team Team Phone # [phone] Pager [phone]

## 2016-10-16 NOTE — Progress Notes (Signed)
Progress Note  Patient Name: Haley Graham Date of Encounter: 10/16/2016  Primary Cardiologist: New  Subjective   Remains disoriented.  Persistent respiratory difficulty. On BiPAP earlier, now on nasal cannula. Urine output was meager over last 24 hours, if correctly recorded. No significant net diuresis. Gen. surgery believes that her partial small bowel obstruction is improving and will try full diet, remove NG tube. Despite normalization of potassium, ECG still shows prominent anterior T-wave inversion and the QT interval is prolonged. A few signs of improvement include normalization of platelet count and a small improvement in albumin, now consistently in the 1.3 1.4 range, up from 1.0-1.1.  Inpatient Medications    Scheduled Meds: . ALPRAZolam  0.25 mg Oral QHS  . aspirin EC  81 mg Oral Daily  . dronabinol  5 mg Oral BID AC  . feeding supplement (ENSURE ENLIVE)  237 mL Oral TID BM  . insulin aspart  0-5 Units Subcutaneous QHS  . insulin aspart  0-9 Units Subcutaneous TID WC  . lisdexamfetamine  20 mg Oral Daily  . metoprolol tartrate  7.5 mg Intravenous Q6H  . scopolamine  1 patch Transdermal Q72H  . sodium chloride flush  10-40 mL Intracatheter Q12H  . sodium chloride flush  3 mL Intravenous Q12H   Continuous Infusions: . anidulafungin Stopped (10/15/16 1743)  . cefTRIAXone (ROCEPHIN)  IV    . famotidine (PEPCID) IV Stopped (10/15/16 2237)  . metronidazole Stopped (10/16/16 0426)   PRN Meds: acetaminophen **OR** acetaminophen, alum & mag hydroxide-simeth, diphenhydrAMINE, fluticasone, ipratropium-albuterol, lidocaine, menthol-cetylpyridinium, senna-docusate, sodium chloride flush, sodium phosphate   Vital Signs    Vitals:   10/16/16 0427 10/16/16 0500 10/16/16 0800 10/16/16 0805  BP:    (!) 159/68  Pulse: (!) 115  (!) 112 (!) 112  Resp: 20 (!) 25  (!) 24  Temp:   98 F (36.7 C)   TempSrc:   Oral   SpO2: 100% 98%  100%  Weight:      Height:         Intake/Output Summary (Last 24 hours) at 10/16/16 1216 Last data filed at 10/16/16 0800  Gross per 24 hour  Intake              730 ml  Output              225 ml  Net              505 ml   Filed Weights   10/12/16 0400 10/13/16 0400 10/14/16 0400  Weight: 154 lb (69.9 kg) 153 lb 1.6 oz (69.4 kg) 154 lb 3.2 oz (69.9 kg)    Telemetry    Sinus tachycardia - Personally Reviewed  ECG    Sinus rhythm, persistent anterior T-wave inversion, QTC 510 ms - Personally Reviewed  Physical Exam  Disoriented, confused, but able to answer some questions coherently and correctly GEN: No acute distress.   Neck:  difficult to appreciate JVD Cardiac: RRR, no murmurs, rubs, or gallops.  Respiratory:  a few bilateral rales. GI: Soft, nontender, non-distended  MS: No edema; No deformity. Neuro:  Nonfocal  Psych: Normal affect   Labs    Chemistry Recent Labs Lab 10/15/16 0301 10/15/16 1912 10/16/16 0448  NA 141 142 139  K 2.7* 3.4* 3.7  CL 114* 114* 114*  CO2 20* 24 21*  GLUCOSE 156* 100* 114*  BUN 10 10 12   CREATININE 0.84 0.87 0.98  CALCIUM 7.3* 7.1* 7.4*  PROT 5.5* 4.9* 4.9*  ALBUMIN 1.4* 1.3* 1.4*  AST 23 20 21   ALT 16 15 15   ALKPHOS 368* 345* 441*  BILITOT 0.7 0.4 0.6  GFRNONAA >60 >60 >60  GFRAA >60 >60 >60  ANIONGAP 7 4* 4*     Hematology Recent Labs Lab 10/15/16 0301 10/15/16 1946 10/16/16 0448  WBC 12.7* 12.4* 15.5*  RBC 3.20* 2.62* 2.81*  HGB 9.8* 8.1* 8.7*  HCT 31.1* 25.7* 27.6*  MCV 97.2 98.1 98.2  MCH 30.6 30.9 31.0  MCHC 31.5 31.5 31.5  RDW 19.0* 19.3* 19.1*  PLT 177 146* 169    Cardiac Enzymes Recent Labs Lab 10/15/16 0301 10/15/16 1912  TROPONINI 0.05* 0.07*   No results for input(s): TROPIPOC in the last 168 hours.   BNPNo results for input(s): BNP, PROBNP in the last 168 hours.   DDimer No results for input(s): DDIMER in the last 168 hours.   Radiology    Dg Chest Port 1 View  Result Date: 10/15/2016 CLINICAL DATA:   Respiratory distress tonight. EXAM: PORTABLE CHEST 1 VIEW COMPARISON:  Single-view of the chest 09/30/2016. FINDINGS: NG tube is in place. Aortic stent graft is noted. There is bilateral mid and lower lung zone airspace disease and left greater than right pleural effusions. Interstitial pulmonary edema is identified. No pneumothorax. IMPRESSION: Interstitial pulmonary edema with left worse than right pleural effusions. Worsened bibasilar airspace disease which could be due to atelectasis or pneumonia. Electronically Signed   By: Inge Rise M.D.   On: 10/15/2016 00:51    Cardiac Studies   Echo 10/14/2016 - Left ventricle: The cavity size was normal. There was mild concentric hypertrophy. Systolic function was moderately reduced. The estimated ejection fraction was in the range of 35% to 40%. There is akinesis of the mid-apicalanteroseptal, anterior, anterolateral, lateral, and apical myocardium. The study is not technically sufficient to allow evaluation of LV diastolic function. - Mitral valve: Calcified annulus.  Impressions:  - Relatively unchanged when compared to prior study 10/06/16  Patient Profile     61 y.o. 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 Readmitted in Augustwith sepsis syndrome, fungemiaand concern for enteritis, anemia and thrombocytopenia. 08/26 developed acute systolic heart failure and echo shows newly depressed LVEF of 30-35%, with akinesis of the mid apical anterior and anteroseptal segments. Heart failure symptoms resolved promptly with diuretic therapy.Follow-up echo performed after one week showed slight improvement in EF 35-40%, unchanged on echo performed after another week on 09/12. Develops recurrent pulmonary edema on evening of 9/12,  again improved with diuretics. Waxing and waning QT prolongation and ECG repolarization abnormalities in the anterolateral distribution. Developed partial small bowel obstruction 10/11/2016, now improving.  Assessment & Plan    1.CHF/Cardiomyopathy:The prolonged course of heart failure/LV dysfunction makes a "pseudo-syndrome less and less likely. LAD ischemia now appears to be the actual problem. Plan high risk diagnostic and possible interventional cardiac catheterization on Tuesday with Dr. Burt Knack. Would like to make sure that she does have normal small bowel function for a couple of days, before placing a coronary stent and committing her to dual antiplatelet therapy. It is encouraging that her thrombocytopenia has resolved. She remains at high risk of complications with the procedure due to the presence of the descending aortic dissection and location of the thoracic aortic stent, which limits access to the right radial approach. Discussed these  considerations with her brother-in-law, Heron Sabins. Her husband [name] has to work. Still appears volume overloaded. Potassium now normal. We'll give 2 doses of intravenous diuretics. Potassium supplement with it. 2. Ao dissection: No chest pain.Chest CT August 26 didnot show extension of the dissection to the proximal ascending aorta. On metoprolol, now being administered intravenously, not quite at desirable BP control. Her Toprol was increased yesterday. We will be better able to control her blood pressure once we can give her oral medications. Blood pressure should be checked only in the right upper extremity.  3. Anemia:8.7 hemoglobin today, up from 8.1 yesterday without transfusion; has dropped a little bit since transfusion 10/01/2016. Thrombocytopenia has resolved. Consider transfusion if hemoglobin drops below 8.  4. QT prolongation:No longer has hypokalemia. Waxing and waning QT could well be explained by coronary insufficiency. Avoid agents that can  prolong the QT interval further. 5. Severe malnutrition: Now on TPN. Albumin was showing a slight improvement even before that, despite her episode of small bowel obstruction 6. SBO: Appears to be improving. Once consistently able to receive enteral feedings, we'll switch beta blocker to oral route 7. Delirium: multifactorial. Should we stop her scopolamine patch?  For questions or updates, please contact [place] Please consult [email or web address] for contact info under Cardiology/STEMI.      Signed, Sanda Klein, MD  10/16/2016, 12:16 PM

## 2016-10-16 NOTE — Progress Notes (Signed)
Patient has an arm restriction on right arm for PICC line and left arm is OK for ABG draw.  Restriction on left arm is for subclavian stent. Payton Emerald, RN

## 2016-10-16 NOTE — Progress Notes (Signed)
Patient continues with confusion. Patient c/o at start of shift that she was in pain (chest) and couldn't breathe. Patient O2 saturations were 100. Patient giving breathing treatment by respiratory and patient placed on bipap. Patient continues to pull at the tubing on the bipap making it alarm. Patient given benadryl so she could go to sleep. The benadryl was ineffective and the patient continues with confusion, saying she needs to go home. Even said at one point that, "we needed to get out because everything was on fire".   Patient was redirected, told her that she was safe and in the hospital. Will continue to monitor

## 2016-10-16 NOTE — Progress Notes (Signed)
ABG ordered for patient however two RTs attempted.  Per Jerene Pitch, from CCM, will hold on ABG for now.  Will continue to monitor.

## 2016-10-16 NOTE — Progress Notes (Signed)
Called rapid response, respiratory and attending MD. Patient has had increasing confusion/delirium throughout the day.  Patient remains tachycardic and tachypnic majority of day.  Pt on Bipap with elevated RR. Patient given second 40 mg dose of IV lasix.  Husband at bedside and daughter on her way. Will continue to monitor. Payton Emerald, RN

## 2016-10-16 NOTE — Progress Notes (Addendum)
Patient taken off of bipap. Unable to get a good seal. bipap kept alarming. Pt on 6L High flow [place]

## 2016-10-16 NOTE — Progress Notes (Addendum)
PCCM Interval Progress Note  S:  Called to bedside to reassess patient.  Patient now back on BiPAP with tachypnea in the 40's.  Dr. Chana Bode at bedside.  Additional dose of lasix 40 mg given.  Husband at the bedside. Unable to get ABG x 3 sticks.  O:  General:  Chronically ill appearing female sitting up in bed on BiPAP in mild distress- agitated Neuro: Agitated, but able to be calmed by husband at bedside, awake, follows commands, MAE CV: rrr PULM: even, tachypneic- improved while in room, lungs bilaterally clear, faintly diminished in bases GI: soft, bs active  Extremities: warm/dry, no edema  Skin: multiple areas of ecchymosis  Blood pressure (!) 146/37, pulse (!) 104, temperature 98.3 F (36.8 C), temperature source Oral, resp. rate 20, height 5\' 3"  (1.6 m), weight 154 lb 3.2 oz (69.9 kg), SpO2 100 %.   A:  Acute respiratory failure Pulmonary edema Acute delirium   P:  Dr. Vaughan Browner spoke and updated patient's daughter on the phone and husband and patient remains full code and would want intubation Patient appears better/ calmer with husband at bedside - getting good TVs, Sats good, vitals stable No need for emergent intubation at this time [place] to stay in SDU for now Continue BiPAP Continue diuresis  Follow I/O's  Will continue to monitor   Add CCT 30 mins  Kennieth Rad, AGACNP-BC [facility] Pgr: [phone] or if no answer [phone] 10/16/2016, 5:19 PM   Attending note: I have seen and examined the patient with nurse practitioner/resident and agree with the note. History, labs and imaging reviewed.  Tenuous resp status.  She appears more comfortable on Bipap Given additional lasix Discussed with husband and daughter. We will observe in SDU for now. But if she deteriorates they are ok transferring her to ICU. She remains full code for now.  We will follow closely  The patient is critically ill with multiple organ systems failure and requires high  complexity decision making for assessment and support, frequent evaluation and titration of therapies, application of advanced monitoring technologies and extensive interpretation of multiple databases.  Additional Critical care time - 35 mins. This represents my time independent of the NPs time taking care of the pt.  Marshell Garfinkel MD [facility] Pager [phone] If no answer or after 3pm call: [phone] 10/16/2016, 6:12 PM

## 2016-10-16 NOTE — Progress Notes (Signed)
Removed NGT per MD order.  Pt tolerated well. Pt made aware that she will not be able to use Bipap when she is drinking.  Diet changed to full liquid. Pt resting with call bell within reach.  Will continue to monitor. Payton Emerald, RN

## 2016-10-16 NOTE — Progress Notes (Signed)
Patient seems to have increase confusion. Trying to get out of bed. Pt kept trying to pull bipap off. Says she needs to go home. Talking to someone in the room. When asked, patient said," it was her daughter [name] asking how Haley Graham is doing. Asked who Haley Graham was, pt said that it was her grandson. Patient also stated that she is 61 years old. Stated  that her grandma passed when she was 28 and here she is at 10 and going through all of this. When asked when her birthday was, she could say it correctly. Will continue to monitor

## 2016-10-16 NOTE — Progress Notes (Signed)
Physical Therapy Treatment Patient Details Name: Haley Graham MRN: [id number] DOB: August 02, 1955 Today's Date: 10/16/2016    History of Present Illness This 61 y.o. female admitted with sepsis, hypotension, dehydration, amemia, COPD, AKI due to poor oral intake and hypotension/dehydration.  PMH  includes:  Recent prolonged hospitalization for [place] type B aortic dissection s/p descending thoracic aortic stent graft placement; anxiety, GERD, COPD,     PT Comments    Patient was limited today by cognition and is oriented to person only. Pt did follow commands inconsistently. Pt required mod A for bed mobility and max A + 2 to stand X1. Attention to tasks was very limited and pt hallucinating during session. Pt returned to bed end of session. Continue to progress as tolerated.    Follow Up Recommendations  SNF     Equipment Recommendations  None recommended by PT    Recommendations for Other Services       Precautions / Restrictions Precautions Precautions: Fall    Mobility  Bed Mobility Overal bed mobility: Needs Assistance Bed Mobility: Supine to Sit;Sit to Supine     Supine to sit: Mod assist Sit to supine: Mod assist   General bed mobility comments: assist to elevate trunk into sitting and then assist to bring bilat LE into bed; multimodal cues for technique  Transfers Overall transfer level: Needs assistance Equipment used: 2 person hand held assist Transfers: Sit to/from Stand Sit to Stand: +2 physical assistance;Max assist         General transfer comment: assist to power up into standing with multimodal cues for positioning and safety; bilat feet blocked; pt unable to achieve erect posture but able to lift buttocks from EOB first trial and second trial attempted use of Stedy however pt unable to stand second time likely due to confusion  Ambulation/Gait                 Stairs            Wheelchair Mobility    Modified Rankin (Stroke Patients  Only)       Balance Overall balance assessment: Needs assistance Sitting-balance support: Feet supported;Bilateral upper extremity supported Sitting balance-Leahy Scale: Fair Sitting balance - Comments: Min guard assist EOB to maintain sitting balance with bil UE supported.      Standing balance-Leahy Scale: Poor                              Cognition Arousal/Alertness: Awake/alert Behavior During Therapy: Restless Overall Cognitive Status: Impaired/Different from baseline Area of Impairment: Following commands;Problem solving;Orientation;Attention;Safety/judgement;Awareness;Memory                 Orientation Level: Place;Situation;Disoriented to;Time Current Attention Level: Focused Memory: Decreased short-term memory Following Commands: Follows one step commands with increased time;Follows one step commands inconsistently Safety/Judgement: Decreased awareness of safety;Decreased awareness of deficits Awareness: Intellectual Problem Solving: Slow processing;Requires verbal cues;Difficulty sequencing;Requires tactile cues;Decreased initiation General Comments: pt was very confused and hallucinating       Exercises      General Comments        Pertinent Vitals/Pain Pain Assessment: Faces Faces Pain Scale: Hurts little more Pain Descriptors / Indicators: Headache;Grimacing Pain Intervention(s): Monitored during session    Home Living                      Prior Function            PT Goals (  current goals can now be found in the care plan section) Acute Rehab PT Goals PT Goal Formulation: With family Time For Goal Achievement: 10/25/2016 Potential to Achieve Goals: Fair Progress towards PT goals: Not progressing toward goals - comment    Frequency    Min 2X/week      PT Plan Current plan remains appropriate    Co-evaluation              AM-PAC PT "6 Clicks" Daily Activity  Outcome Measure  Difficulty turning over in  bed (including adjusting bedclothes, sheets and blankets)?: Unable Difficulty moving from lying on back to sitting on the side of the bed? : Unable Difficulty sitting down on and standing up from a chair with arms (e.g., wheelchair, bedside commode, etc,.)?: Unable Help needed moving to and from a bed to chair (including a wheelchair)?: A Lot Help needed walking in hospital room?: Total Help needed climbing 3-5 steps with a railing? : Total 6 Click Score: 7    End of Session Equipment Utilized During Treatment: Gait belt Activity Tolerance: Other (comment) (limited by cognition) Patient left: with call bell/phone within reach;in bed;with bed alarm set Nurse Communication: Mobility status PT Visit Diagnosis: Muscle weakness (generalized) (M62.81);Other abnormalities of gait and mobility (R26.89);Unsteadiness on feet (R26.81);Adult, failure to thrive (R62.7)     Time: 2836-6294 PT Time Calculation (min) (ACUTE ONLY): 33 min  Charges:  $Therapeutic Activity: 23-37 mins                    G Codes:       Earney Navy, PTA Pager: [phone]     Darliss Cheney 10/16/2016, 2:30 PM

## 2016-10-16 NOTE — Progress Notes (Signed)
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 She presented with weakness and met sepsis criteria initially with concern for enteritis.  She grew a yeast UTI and on 8/18 1/2 blood cultures grew yeast and the patient was started on eraxis.  She was switched to fluconazole and antibiotics were d/c'd on 8/20 with blood cultures only growing candida.  ID recommended continuing fluconazole 400 mg daily through 9/26 and recommended ophtho exam within the next month.     She had an echo on 8/28 which showed new systolic dysfunction and her QT was markedly prolonged so she was changed back from fluconazole to Eraxis until 9/26.  Cardiology was consulted and she was diuresed.   On 9/10 she developed new nausea and vomiting.  KUB showed concern for a SBO.  CT confirmed this and also showed evidence of possible enteritis.  Repeat blood cultures obtained and started on ceftriaxone/flagyl.   Given severe malnutrition will need to consider TPN for Haley Graham and won't be able to place PICC until blood Cx are Negative x 3 days per ID reccs.   On 9/12 ECHO was repeated and was essentially the same; Deferring to Cardiology for further invasive procedures such as angiography and likely to be done early next if stable  Patient had Rapid Response on the night of 10/15/16 and ended up on BiPAP and being diuresed. She improved but continued to be delirious. This AM NGT was removed by Surgery and patient was placed on FULL Liquid  Diet. TPN was ordered for Patient but held due to evaluating if she could tolerate a FULL Diet and possibly TF through [place].  Another Rapid Response was called on the patient as patient was increasingly Tachypenic on BiPAP. I went to assess the patient and ordered a Stat CXR, ABG, and gave an additional 40 of IV Lasix. Patient was maintaining saturations but still had a tenuous respiratory status so PCCM was called to evaluate and after evaluation patient improved and will remain in SDU with low threshold to transfer to ICU if she deteriorates.   Assessment & Plan:   Principal Problem:   Candidemia ([place]) Active Problems:   GERD (gastroesophageal reflux disease)   Dissection of thoracoabdominal aorta (HCC)   AAA (abdominal aortic aneurysm) (HCC)   Benign essential HTN   Diabetes mellitus type 2 in obese (HCC)   Anxiety state   Hyponatremia   Leukocytosis   Sepsis (HCC)   Hypotension   AKI (acute kidney injury) ([place])   Dehydration   Generalized weakness   Anemia   Thrombocytopenia (HCC)   Abnormal computed tomography angiography (CTA) of abdomen and pelvis   Colitis: Probable   Protein-calorie malnutrition, severe   Palliative care by specialist   Adult failure to thrive   Hypophosphatemia   Hypomagnesemia   Depression   Acute systolic (congestive) heart failure ([place])   DNR (do not resuscitate) discussion   Congestive heart failure (HCC)   Heme positive stool   Cardiac LV ejection fraction 30-35%   QT prolongation   Acute hypercapnic respiratory failure (HCC)   Metabolic encephalopathy   Pulmonary edema  cardiac cause (HCC)   Acute respiratory distress   Encephalopathy acute  Acute Hypercapnic Respiratory Failure Requiring NIPPV -Decompensated overnight and ended up on BiPAP -Given Aggressive Diuresis with IV Lasix; Given additional IV Lasix during rapid -PCCM Evaluated and Improved   -Repeat ABG improved but ABG during Rapid unable to be drawn.  -Transitioned to [place]  but now back on BiPAP -C/w NIPPV with BiPAP qHS and prn  Small Bowel Obstruction  Enteritis?:   - Surgery following, SB, protocol, KUB this morning showed no convincing radiographic improvement in apparent mid/distal small bowel obstruction. Nasogastric tube traverses decompressed stomach. -C/w ceftriaxone/flagyl for evidence of ? enteritis, although with her history, may be related to mesenteric ischemia. repeat blood cx as well due to tachy, but normal WBC, without fevers. Low Threshold to de-escalate  - D5 NS at 75 mL/hr ordered x 3 days stopped as patient went into volume overload last night -Surgery allowing Clears with Sips as NGT stopped putting out.  NGT Clamped and removed -Diet Advanced to FULL's -Patient ordered TPN but will Hold off given tenuous Respiratory Status and want to see if she tolerates FULLS and possibly another Cortrak with TF  Sepsis due to Candidemia, improved  -Sepsis Physiology improved  - discussed with ID, ID to see, switched to eraxis (caspofungin not on formulary) after discussion with ID -> continue until 9/26  -Case was discussed with Dr. Valetta Close 9/7 who recommended follow up after discharge for ophthalmologic exam -Some concern for thrush, ctm on eraxis, she's been on fluconazole  -Per ID continue Anidulafungin for 6 weeks; PICC to be placed after recent cultures 9/11 NG x at least 72h. Will need double lumen for TNA + prolonged antifungal course -PICC placed 10/15/16 at the recommendation of ID; Was going to start TPN but will hold off for now   Splenic Infarcts:   -Unclear cause, new since previous study per rads.   -Possibly related to thrombosis or dissection within aorta, though thrombosis is infrarenal and celiac comes off true lumen.    Acute systolic congestive heart failure:  - Holding lasix 20 mg PO daily, with tachycardia/N/V with above SBO -Repeat echo with EF 35-40% and grade 2 diastolic dysfunction as well as hypokinesis of  mid-apicalanteroseptal, anterior, and inferolateral myocardium (mildly improved from 8/28 echo at 30-35%) -Follow up cardiology recs Cardiology rec f/u in clinic in 4-6 weeks with repeat echo -> Cards repeated ECHO 10/15/11 which was essentially unchanged -May needLexiscan Myoview versus cath but will defer to Cardiology.Denied shortness of breath or chest pain today. Continue to monitor urine output and electrolytes. Cardiology consult appreciated. -C/w Diuresis per Cards; Gave an additonal IV 40 mg of Lasix  -On beta blocker, consider ace/arb in future -Cardiology would potentially consider invasive angiography and likely compelled to proceed with Coronary Angiography to clarify diagnosis as it is less likely stress cardiomyopathy and likely LAD stenosis and ischemia. -Patient unstable for Cardiac Cath at this time and it is tentatively scheduled for early next week (Tuesday); Cards recommending implantation of Bare-metal Stent  Prolonged QTC: Over 600 initially and improved.   -D/c fluconazole.  D/c tramadol and hold cymbalta.  Zofran discontinued.  -Discontinue Azithromycin and abilify.  Monitor in telemetry. Repeat EKG.  Tachycardia:  -likely 2/2 SBO/N/V above.  Sinus tach on EKG.  Will continue to monitor. Gentle IVF.  Bolus 250 cc and repeat prn if tolerated well.   Severe protein calorie malnutrition:  -Nutritionist Consulted -Cortrak was dc'd.  Will need to consider TPN  again but now that NGT is removed will hold off and see if patient will tolerate FULLs and another Cortrak to allow for TF.   -PICC Placed incase TPN to be started again  Acute kidney Injury  -Improved -Continue to Monitor CMP's  Acute on Chronic anemia likely due to chronic disease vs acute GI bleed:  -Iron stores acceptable. Fecal occult blood test positive. On Protonix. Status post 2 unit of red blood cell transfusion on 8/30 with improvement in hemoglobin. Evaluated by GI. No further endoscopy at this time.  -Hemoglobin level stable at 8.7/27.6 -Monitor CBC  Thrombocytopenia likely due to acute illness:  -Monitor CBC. Platelet count up and down over past few days.  -Continue to Monitor; Platelet Count was 136 and improved to 169  Depression and Anxiety:  -Evaluated by psychiatrist.   -Continue Xanax, holding cymbalta, andVyvanse per psychiatrist  Hypomagnesemia -Mag was 1.6 and improved to 1.9 -Replete as necessary  -Repeat Mag Level in AM  Loose stools:  -Negative C. Diff; Possibly from SBO -Had two incontinent stools today.   Acute Delirium:  -C/w Delirium precautions -Continue to Redirect  -Continue to try to treat underlying causes -Still remains severely confused and altered   Hypokalemia -Patient's K+ was 2.7 and improved to 3.7 -Replete as necessary  -Continue to Monitor  Goals of care:  -Palliative follow up with patient/family yesteday. Had a discussion with Husband about Haley Graham.  Discussed that she was extremely sick and that it is hard to predict when these new complications would resolve (both asked about when she might be able to get to the SNF). -Discussed need for TPN again as well and will consider starting in AM.    Leukocytosis -Worsening -On Abx -WBC went from 12.4 -> 15.5 -?Reactive -Recent Blood Cx show NGTD at 3 days -May need to Pan-Culture Again and broaden Abx as she is on Ceftriaxone and Flagyl -Continue to Monitor and Repeat CBC in AM   DVT prophylaxis: SCDs Code Status: FULL CODE Family Communication: Discussed with Husband over the phone Disposition Plan: SNF when medically Stable   Consultants:   Cardiology  Psychiatry  Infectious Diseases  Palliative Care Medicine  Gastroenterology  General Surgery  Nutrition  Discussed Case with Vascular   PCCM   Procedures:   Thoracentesis 8/17  Echo 8/28 EF 30-35%, akinesis of mid-apicalanteroseptal and anterior myocardium. Diastolic dysfunction.  Echo 8/13 EF  29-51, diastolic dysfunction, lipomatous hypertrophy  Echo 9/4 EF 35-40%, hypokinesis of mid apicalanteroseptal, anterior, and inferolateral myocardium, grade 2 diastolic dysfunction -Echo 9/12 There was mild concentric hypertrophy. Systolic function was moderately reduced. The estimated ejection fraction was in the range of 35% to 40%. There is akinesis of the mid-apicalanteroseptal, anterior,   anterolateral, lateral, and apical myocardium. The study is not technically sufficient to allow evaluation of LV diastolic   Antimicrobials:  Anti-infectives    Start     Dose/Rate Route Frequency Ordered Stop   10/16/16 1400  cefTRIAXone (ROCEPHIN) 2 g in dextrose 5 % 50 mL IVPB     2 g 100 mL/hr over 30 Minutes Intravenous Every 24 hours 10/16/16 0810     10/14/16 1500  anidulafungin (ERAXIS) 100 mg in sodium chloride 0.9 % 100 mL IVPB     100 mg 78 mL/hr over 100 Minutes Intravenous Every 24 hours 10/13/16 1018 11/25/16 1459   10/13/16 1100  cefTRIAXone (ROCEPHIN) 2 g in dextrose 5 % 50 mL IVPB - Premix  Status:  Discontinued  2 g 100 mL/hr over 30 Minutes Intravenous Every 24 hours 10/13/16 1016 10/16/16 0810   10/13/16 1100  metroNIDAZOLE (FLAGYL) IVPB 500 mg     500 mg 100 mL/hr over 60 Minutes Intravenous Every 8 hours 10/13/16 1016     10/13/16 1030  anidulafungin (ERAXIS) 200 mg in sodium chloride 0.9 % 200 mL IVPB     200 mg 78 mL/hr over 200 Minutes Intravenous  Once 10/13/16 1018 10/13/16 2052   09/27/16 2200  erythromycin 250 mg in sodium chloride 0.9 % 100 mL IVPB  Status:  Discontinued     250 mg 100 mL/hr over 60 Minutes Intravenous Every 8 hours 09/27/16 1842 09/27/16 1959   09/27/16 2200  erythromycin (EES) 400 MG/5ML suspension 250 mg  Status:  Discontinued     250 mg Oral Every 8 hours 09/27/16 1959 09/29/16 1147   09/24/16 1300  fluconazole (DIFLUCAN) tablet 400 mg  Status:  Discontinued     400 mg Oral Daily 09/24/16 1138 10/13/16 0953   09/21/16 1400  fluconazole  (DIFLUCAN) IVPB 400 mg  Status:  Discontinued     400 mg 100 mL/hr over 120 Minutes Intravenous Every 24 hours 09/20/16 1220 09/20/16 1221   09/21/16 0000  anidulafungin (ERAXIS) 50 mg in sodium chloride 0.9 % 50 mL IVPB  Status:  Discontinued     50 mg 78 mL/hr over 50 Minutes Intravenous Every 24 hours 09/19/16 0345 09/19/16 0346   09/20/16 1400  fluconazole (DIFLUCAN) IVPB 800 mg  Status:  Discontinued     800 mg 200 mL/hr over 120 Minutes Intravenous  Once 09/20/16 1220 09/20/16 1221   09/20/16 1400  fluconazole (DIFLUCAN) IVPB 400 mg  Status:  Discontinued     400 mg 100 mL/hr over 120 Minutes Intravenous Every 24 hours 09/20/16 1221 09/24/16 1137   09/19/16 2200  anidulafungin (ERAXIS) 100 mg in sodium chloride 0.9 % 100 mL IVPB  Status:  Discontinued     100 mg 78 mL/hr over 100 Minutes Intravenous Every 24 hours 09/19/16 0345 09/20/16 1220   09/19/16 0400  anidulafungin (ERAXIS) 200 mg in sodium chloride 0.9 % 200 mL IVPB     200 mg 78 mL/hr over 200 Minutes Intravenous  Once 09/19/16 0345 09/19/16 0819   09/18/16 1800  fluconazole (DIFLUCAN) IVPB 100 mg  Status:  Discontinued     100 mg 50 mL/hr over 60 Minutes Intravenous Every 24 hours 09/18/16 1109 09/19/16 1937   09/17/16 1800  ceFEPIme (MAXIPIME) 2 g in dextrose 5 % 50 mL IVPB  Status:  Discontinued     2 g 100 mL/hr over 30 Minutes Intravenous Every 24 hours 09/17/16 1243 09/21/16 1200   09/17/16 1800  fluconazole (DIFLUCAN) IVPB 200 mg  Status:  Discontinued     200 mg 100 mL/hr over 60 Minutes Intravenous Every 24 hours 09/17/16 1651 09/18/16 1109   09/12/16 1400  ceFEPIme (MAXIPIME) 1 g in dextrose 5 % 50 mL IVPB  Status:  Discontinued     1 g 100 mL/hr over 30 Minutes Intravenous Every 8 hours 09/12/16 1127 09/17/16 1243   09/11/16 1600  metroNIDAZOLE (FLAGYL) IVPB 500 mg  Status:  Discontinued     500 mg 100 mL/hr over 60 Minutes Intravenous Every 8 hours 09/11/16 0902 09/21/16 1200   09/11/16 1400  ceFEPIme  (MAXIPIME) 1 g in dextrose 5 % 50 mL IVPB  Status:  Discontinued     1 g 100 mL/hr over 30 Minutes Intravenous Every 24  hours 09/11/16 1234 09/12/16 1127   09/10/16 2000  vancomycin (VANCOCIN) IVPB 1000 mg/200 mL premix  Status:  Discontinued     1,000 mg 200 mL/hr over 60 Minutes Intravenous Every 24 hours 09/08/2016 2130 09/10/16 1616   09/10/16 1800  vancomycin (VANCOCIN) IVPB 750 mg/150 ml premix  Status:  Discontinued     750 mg 150 mL/hr over 60 Minutes Intravenous Every 12 hours 09/10/16 1616 09/12/16 1145   09/10/16 1400  levofloxacin (LEVAQUIN) IVPB 500 mg  Status:  Discontinued     500 mg 100 mL/hr over 60 Minutes Intravenous Every 24 hours 09/24/2016 2112 09/11/16 1220   09/10/16 0115  vancomycin (VANCOCIN) IVPB 1000 mg/200 mL premix  Status:  Discontinued     1,000 mg 200 mL/hr over 60 Minutes Intravenous  Once 09/10/16 0110 09/10/16 0118   09/22/2016 2200  metroNIDAZOLE (FLAGYL) IVPB 500 mg  Status:  Discontinued     500 mg 100 mL/hr over 60 Minutes Intravenous Every 6 hours 09/12/2016 2110 09/11/16 0902   09/18/2016 2115  vancomycin (VANCOCIN) IVPB 1000 mg/200 mL premix     1,000 mg 200 mL/hr over 60 Minutes Intravenous  Once 09/02/2016 2108 09/30/2016 2323   09/15/2016 1430  levofloxacin (LEVAQUIN) IVPB 750 mg     750 mg 100 mL/hr over 90 Minutes Intravenous  Once 10/01/2016 1415 09/28/2016 2152   09/07/2016 1430  metroNIDAZOLE (FLAGYL) IVPB 500 mg     500 mg 100 mL/hr over 60 Minutes Intravenous  Once 09/30/2016 1415 10/01/2016 1850   09/03/2016 1430  levofloxacin (LEVAQUIN) IVPB 750 mg  Status:  Discontinued     750 mg 100 mL/hr over 90 Minutes Intravenous  Once 09/27/2016 1416 09/28/2016 1416     Subjective: Seen and examined at bedside this AM and was resting on BiPAP. Then later she had increased work of breathing and a Rapid Response was called. I evaluated the patient and she still remained confused. Was given IV Lasix, Stat CXR, and ABG. Critical Care was called who assessed the patient as  well and it was decided that patient was to remain in SDU and have a low threshold to transfer to ICU if she deteriorates. Husband was updated as he was at bedside and Dr. Vaughan Browner of PCCM spoke with the daughter and she is ok for her mother to be intubated if necessary.   Objective: Vitals:   10/16/16 1800 10/16/16 1900 10/16/16 1924 10/16/16 2000  BP:      Pulse:      Resp: (!) _0 (!) 24  Temp:      TempSrc:      SpO2: 100% 100%  100%  Weight:      Height:        Intake/Output Summary (Last 24 hours) at 10/16/16 2045 Last data filed at 10/16/16 1603  Gross per 24 hour  Intake              630 ml  Output             1275 ml  Net             -645 ml   Filed Weights   10/12/16 0400 10/13/16 0400 10/14/16 0400  Weight: 69.9 kg (154 lb) 69.4 kg (153 lb 1.6 oz) 69.9 kg (154 lb 3.2 oz)   Examination: Physical Exam:  Constitutional: Confused Caucasian female in some Respiratory Distress Eyes: Sclerae anicteric. Pupils dilated ENMT: Grossly normal hearing. Mucous membranes appear moist Neck: Supple with no  JVD Respiratory: Diminished bilaterally with some crackles. On BiPAP Cardiovascular: Tachycardic rate but regular Rhythm; 1+ LE edema Abdomen: Soft, NT, ND; Bowel Sounds present GU: Has foley in place Musculoskeletal: No contractures; No cyanosis Skin: Has diffuse upper extremity ecchymosis Neurologic: No appreciable focal deficits but is Altered Psychiatric: Severely confused and altered. Impaired judgement and insight  Data Reviewed: I have personally reviewed following labs and imaging studies  CBC:  Recent Labs Lab 10/13/16 0337 10/14/16 0339 10/15/16 0301 10/15/16 1946 10/16/16 0448  WBC 8.3 7.1 12.7* 12.4* 15.5*  NEUTROABS  --   --   --  9.4* 12.8*  HGB 9.2* 10.5* 9.8* 8.1* 8.7*  HCT 27.5* 31.9* 31.1* 25.7* 27.6*  MCV 95.5 97.6 97.2 98.1 98.2  PLT 101* 136* 177 146* 361   Basic Metabolic Panel:  Recent Labs Lab 10/12/16 1439 10/13/16 0337  10/14/16 0339 10/15/16 0301 10/15/16 1912 10/16/16 0448  NA 135 136 140 141 142 139  K 5.0 4.5 3.9 2.7* 3.4* 3.7  CL 104 109 113* 114* 114* 114*  CO2 22 20* 22 20* 24 21*  GLUCOSE 122* 91 104* 156* 100* 114*  BUN _0 CREATININE 0.76 0.72 0.81 0.84 0.87 0.98  CALCIUM 7.5* 7.0* 6.9* 7.3* 7.1* 7.4*  MG 1.7 1.6* 2.3  --  1.6* 1.9  PHOS  --   --   --   --  3.4 3.4   GFR: Estimated Creatinine Clearance: 57.2 mL/min (by C-G formula based on SCr of 0.98 mg/dL). Liver Function Tests:  Recent Labs Lab 10/13/16 0337 10/14/16 0339 10/15/16 0301 10/15/16 1912 10/16/16 0448  AST _1 ALT _2 ALKPHOS 302* 305* 368* 345* 441*  BILITOT 0.6 0.5 0.7 0.4 0.6  PROT 4.4* 4.8* 5.5* 4.9* 4.9*  ALBUMIN 1.1* 1.3* 1.4* 1.3* 1.4*   No results for input(s): LIPASE, AMYLASE in the last 168 hours. No results for input(s): AMMONIA in the last 168 hours. Coagulation Profile: No results for input(s): INR, PROTIME in the last 168 hours. Cardiac Enzymes:  Recent Labs Lab 10/15/16 0301 10/15/16 1912  TROPONINI 0.05* 0.07*   BNP (last 3 results) No results for input(s): PROBNP in the last 8760 hours. HbA1C: No results for input(s): HGBA1C in the last 72 hours. CBG:  Recent Labs Lab 10/15/16 0638 10/15/16 1651 10/15/16 2156 10/16/16 0616 10/16/16 1126  GLUCAP 115* 95 103* 113* 105*   Lipid Profile: No results for input(s): CHOL, HDL, LDLCALC, TRIG, CHOLHDL, LDLDIRECT in the last 72 hours. Thyroid Function Tests: No results for input(s): TSH, T4TOTAL, FREET4, T3FREE, THYROIDAB in the last 72 hours. Anemia Panel: No results for input(s): VITAMINB12, FOLATE, FERRITIN, TIBC, IRON, RETICCTPCT in the last 72 hours. Sepsis Labs:  Recent Labs Lab 10/15/16 0301  LATICACIDVEN 1.2    Recent Results (from the past 240 hour(s))  C difficile quick screen w PCR reflex     Status: None   Collection Time: 10/07/16 11:58 AM  Result Value Ref Range Status    C Diff antigen NEGATIVE NEGATIVE Final   C Diff toxin NEGATIVE NEGATIVE Final   C Diff interpretation No C. difficile detected.  Final  Culture, blood (routine x 2)     Status: None (Preliminary result)   Collection Time: 10/13/16  6:58 PM  Result Value Ref Range Status   Specimen Description BLOOD LEFT HAND  Final   Special Requests IN PEDIATRIC BOTTLE Blood Culture adequate volume  Final  Culture NO GROWTH 3 DAYS  Final   Report Status PENDING  Incomplete  Culture, blood (routine x 2)     Status: None (Preliminary result)   Collection Time: 10/13/16  7:21 PM  Result Value Ref Range Status   Specimen Description BLOOD RIGHT HAND  Final   Special Requests IN PEDIATRIC BOTTLE Blood Culture adequate volume  Final   Culture NO GROWTH 3 DAYS  Final   Report Status PENDING  Incomplete   Radiology Studies: Dg Chest Port 1 View  Result Date: 10/16/2016 CLINICAL DATA:  61 year old female with shortness of breath EXAM: PORTABLE CHEST 1 VIEW COMPARISON:  Prior chest x-ray 10/15/2016 FINDINGS: Stable cardiomegaly. Thoracic aortic endograft remains in unchanged position compared to prior. A gastric tube is been removed. Persistent bilateral layering pleural effusions slightly larger on the left than the right. Mild vascular congestion with interstitial pulmonary edema. Findings appear slightly improved compared yesterday. No pneumothorax. No acute osseous abnormality. IMPRESSION: 1. Slightly improved interstitial pulmonary edema. 2. Persistent bilateral layering pleural effusions slightly larger on the left than the right. 3. Unchanged position of thoracic aortic stent graft. 4. Stable cardiomegaly. Electronically Signed   By: Jacqulynn Cadet M.D.   On: 10/16/2016 17:51   Dg Chest Port 1 View  Result Date: 10/15/2016 CLINICAL DATA:  Respiratory distress tonight. EXAM: PORTABLE CHEST 1 VIEW COMPARISON:  Single-view of the chest 09/30/2016. FINDINGS: NG tube is in place. Aortic stent graft is noted.  There is bilateral mid and lower lung zone airspace disease and left greater than right pleural effusions. Interstitial pulmonary edema is identified. No pneumothorax. IMPRESSION: Interstitial pulmonary edema with left worse than right pleural effusions. Worsened bibasilar airspace disease which could be due to atelectasis or pneumonia. Electronically Signed   By: Inge Rise M.D.   On: 10/15/2016 00:51   Scheduled Meds: . ALPRAZolam  0.25 mg Oral QHS  . aspirin EC  81 mg Oral Daily  . dronabinol  5 mg Oral BID AC  . feeding supplement (ENSURE ENLIVE)  237 mL Oral TID BM  . insulin aspart  0-5 Units Subcutaneous QHS  . insulin aspart  0-9 Units Subcutaneous TID WC  . lisdexamfetamine  20 mg Oral Daily  . metoprolol tartrate  7.5 mg Intravenous Q6H  . scopolamine  1 patch Transdermal Q72H  . sodium chloride flush  10-40 mL Intracatheter Q12H  . sodium chloride flush  3 mL Intravenous Q12H   Continuous Infusions: . anidulafungin Stopped (10/16/16 1711)  . cefTRIAXone (ROCEPHIN)  IV Stopped (10/16/16 1442)  . famotidine (PEPCID) IV Stopped (10/16/16 1328)  . metronidazole 500 mg (10/16/16 2004)    LOS: 16 days   Kerney Elbe, DO [facility] Pager [phone]  If 7PM-7AM, please contact night-coverage [email or web address] Password [facility] 10/16/2016, 8:45 PM

## 2016-10-16 NOTE — Progress Notes (Signed)
Name: Haley Graham MRN: [id number] DOB: 05-29-1955    ADMISSION DATE:  09/30/2016 CONSULTATION DATE:  10/15/16  REFERRING MD :  Alfredia Ferguson  CHIEF COMPLAINT:  AMS   HISTORY OF PRESENT ILLNESS:  Haley Graham is a 61 y.o. female with a PMH as outlined below.  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 She was readmitted 10/02/2016 and was found to have candidemia. She was subsequently started on Eraxis which was later swapped to fluconazole with additional antibiotics stopped given negative blood cultures. ID had recommended continuing fluconazole through 9/26 along with ophthalmology exam in the next month. She had echo on 8/28 that showed new systolic heart failure with QT prolongation; (EF 30-35%, G1 DD) therefore, fluconazole was changed back to the Eraxis. 9/10 she developed nausea and vomiting. KUB was concerning for an SBO. CT was subsequently obtained and confirmed these findings along with possible enteritis. She was subsequent restarted on ceftriaxone and Flagyl. Surgery had recommended TPN due to severe malnutrition; however, PICC has been deferred until blood cultures remain negative 3. During early a.m. hours of 10/15/16, patient had hypersomnolence. ABG was obtained and demonstrated hypercapnic respiratory failure (7.12/68/177).  He was subsequently started on BiPAP and PCCM was consulted. CXR was obtained and demonstrated pulmonary edema. She was given 40 mg of Lasix and had good urine output. Several minutes later after starting BiPAP, patient became more arousable. At the time of our exam, she is fully awake and able to cooperate and answer questions. She remains on BiPAP. She is to receive an additional 40 mg of Lasix now.  He has repeat ABG pending for 2:45 AM.   SUBJECTIVE:  Per RN, patient with delirium for the last 2 days intermittent BiPAP to [place] 6L due  to patient pulling at mask Net -95 ml/ 24hrs NGT removed this am per Surgery w/goals to advance diet Possibly going for cardiac cath next week if able  VITAL SIGNS: Temp:  [97.7 F (36.5 C)-98.5 F (36.9 C)] 98 F (36.7 C) (09/14 0800) Pulse Rate:  [103-119] 112 (09/14 0805) Resp:  [18-25] 24 (09/14 0805) BP: (138-164)/(68-118) 159/68 (09/14 0805) SpO2:  [98 %-100 %] 100 % (09/14 0805) FiO2 (%):  [60 %] 60 % (09/14 0427)  PHYSICAL EXAMINATION: General: Chronically ill appearing female lying in bed picking at lines/tubes Neuro: Alert, oriented to person and place, otherwise confused, f/c's, MAE  HEENT: [place]/AT. PERRL, sclerae anicteric. Cardiovascular: RR, no M/R/G.  Lungs: Respirations even and unlabored on HFNC 6L at 100%.  Tachypneic at 28.  Faint crackles right base otherwise clear Abdomen: BS x 4, soft, NT/ND.  Musculoskeletal: No gross deformities, trace edema.  Skin: Ecchymosis throughout chest and upper arms, skin warm, no rashes.    Recent Labs Lab 10/15/16 0301 10/15/16 1912 10/16/16 0448  NA 141 142 139  K 2.7* 3.4* 3.7  CL 114* 114* 114*  CO2 20* 24 21*  BUN 10 10 12   CREATININE 0.84 0.87 0.98  GLUCOSE 156* 100* 114*    Recent Labs Lab 10/15/16 0301 10/15/16 1946 10/16/16 0448  HGB 9.8* 8.1* 8.7*  HCT 31.1* 25.7* 27.6*  WBC 12.7* 12.4* 15.5*  PLT 177 146* 169   Dg Chest Port 1 View  Result Date: 10/15/2016 CLINICAL DATA:  Respiratory distress tonight. EXAM: PORTABLE CHEST 1 VIEW COMPARISON:  Single-view of the chest 09/30/2016. FINDINGS: NG tube is in place. Aortic stent graft is noted.  There is bilateral mid and lower lung zone airspace disease and left greater than right pleural effusions. Interstitial pulmonary edema is identified. No pneumothorax. IMPRESSION: Interstitial pulmonary edema with left worse than right pleural effusions. Worsened bibasilar airspace disease which could be due to atelectasis or pneumonia. Electronically Signed   By: Inge Rise M.D.   On: 10/15/2016 00:51    STUDIES:  TTE 10/14/16 > EF 35-40% CXR 9/13 > interstitial edema, bilateral effusions L > R.  SIGNIFICANT EVENTS  8/8 > admit. 8/12 > nephrology consult for edema > started on PO lasix. 8/17 > psych consult for depression. 8/18 > ID consult for fungemia. 8/20 > palliative care consult. 8/29 > cardiology consult. 8/30 > GI consult. 9/10 > general surgery consult. 9/13 > PCCM consult.   ASSESSMENT / PLAN:  Acute hypercapnic respiratory failure - possibly r/t to acute pulmonary edema / volume overload with resultant decreased ventilation. Plan: Remains high risk for intubation secondary to delirium and tachypnea Continue BiPAP as needed for somnolence/ work of breathing otherwise continue supplemental O2 as needed for sats 94-98% Caution with sedating meds CXR in am Will need tx to ICU if acutely decompensates  Acute pulmonary edema - improving  Plan: Aggressive diuresis per Cards as BP/ renal tolerates  Replace electrolytes as indicated  Systolic HF/ Cardiomyopathy, prolonged QTc Plan:  Management per Cards Diuresis as above ? Cardiac cath planned for next week  Candidemia - being followed by ID. Plan: Continue Eraxis per ID recs. PICC once blood cultures from 9/11  neg x 72 hrs.  SBO with possible enteritis - being followed by surgery. Plan: Continue medical management per surgery  Flagyl per surgery   Failure to thrive. Plan: PMT following   Rest of care per primary team.   No family at bedside.  PCCM will continue to follow patient as she remains high risk for respiratory decompensation.   Kennieth Rad, AGACNP-BC [facility] Pgr: [phone] or if no answer [phone] 10/16/2016, 2:53 PM

## 2016-10-16 NOTE — Progress Notes (Signed)
Subjective/Chief Complaint: ON BIPAP Confused    Objective: Vital signs in last 24 hours: Temp:  [97.7 F (36.5 C)-98.5 F (36.9 C)] 98.1 F (36.7 C) (09/14 0358) Pulse Rate:  [103-119] 115 (09/14 0427) Resp:  [18-25] 25 (09/14 0500) BP: (138-164)/(73-118) 164/76 (09/14 0400) SpO2:  [98 %-100 %] 98 % (09/14 0500) FiO2 (%):  [60 %] 60 % (09/14 0427) Last BM Date: 10/15/16  Intake/Output from previous day: 09/13 0701 - 09/14 0700 In: 730 [IV Piggyback:730] Out: 825 [Urine:825] Intake/Output this shift: No intake/output data recorded.  GI: soft NT ND   Lab Results:   Recent Labs  10/15/16 1946 10/16/16 0448  WBC 12.4* 15.5*  HGB 8.1* 8.7*  HCT 25.7* 27.6*  PLT 146* 169   BMET  Recent Labs  10/15/16 1912 10/16/16 0448  NA 142 139  K 3.4* 3.7  CL 114* 114*  CO2 24 21*  GLUCOSE 100* 114*  BUN 10 12  CREATININE 0.87 0.98  CALCIUM 7.1* 7.4*   PT/INR No results for input(s): LABPROT, INR in the last 72 hours. ABG  Recent Labs  10/15/16 0041 10/15/16 0255  PHART 7.120* 7.435  HCO3 21.4 20.6    Studies/Results: Dg Chest Port 1 View  Result Date: 10/15/2016 CLINICAL DATA:  Respiratory distress tonight. EXAM: PORTABLE CHEST 1 VIEW COMPARISON:  Single-view of the chest 09/30/2016. FINDINGS: NG tube is in place. Aortic stent graft is noted. There is bilateral mid and lower lung zone airspace disease and left greater than right pleural effusions. Interstitial pulmonary edema is identified. No pneumothorax. IMPRESSION: Interstitial pulmonary edema with left worse than right pleural effusions. Worsened bibasilar airspace disease which could be due to atelectasis or pneumonia. Electronically Signed   By: Inge Rise M.D.   On: 10/15/2016 00:51    Anti-infectives: Anti-infectives    Start     Dose/Rate Route Frequency Ordered Stop   10/16/16 1400  cefTRIAXone (ROCEPHIN) 2 g in dextrose 5 % 50 mL IVPB     2 g 100 mL/hr over 30 Minutes Intravenous  Every 24 hours 10/16/16 0810     10/14/16 1500  anidulafungin (ERAXIS) 100 mg in sodium chloride 0.9 % 100 mL IVPB     100 mg 78 mL/hr over 100 Minutes Intravenous Every 24 hours 10/13/16 1018 11/25/16 1459   10/13/16 1100  cefTRIAXone (ROCEPHIN) 2 g in dextrose 5 % 50 mL IVPB - Premix  Status:  Discontinued     2 g 100 mL/hr over 30 Minutes Intravenous Every 24 hours 10/13/16 1016 10/16/16 0810   10/13/16 1100  metroNIDAZOLE (FLAGYL) IVPB 500 mg     500 mg 100 mL/hr over 60 Minutes Intravenous Every 8 hours 10/13/16 1016     10/13/16 1030  anidulafungin (ERAXIS) 200 mg in sodium chloride 0.9 % 200 mL IVPB     200 mg 78 mL/hr over 200 Minutes Intravenous  Once 10/13/16 1018 10/13/16 2052   09/27/16 2200  erythromycin 250 mg in sodium chloride 0.9 % 100 mL IVPB  Status:  Discontinued     250 mg 100 mL/hr over 60 Minutes Intravenous Every 8 hours 09/27/16 1842 09/27/16 1959   09/27/16 2200  erythromycin (EES) 400 MG/5ML suspension 250 mg  Status:  Discontinued     250 mg Oral Every 8 hours 09/27/16 1959 09/29/16 1147   09/24/16 1300  fluconazole (DIFLUCAN) tablet 400 mg  Status:  Discontinued     400 mg Oral Daily 09/24/16 1138 10/13/16 0953   09/21/16 1400  fluconazole (DIFLUCAN) IVPB 400 mg  Status:  Discontinued     400 mg 100 mL/hr over 120 Minutes Intravenous Every 24 hours 09/20/16 1220 09/20/16 1221   09/21/16 0000  anidulafungin (ERAXIS) 50 mg in sodium chloride 0.9 % 50 mL IVPB  Status:  Discontinued     50 mg 78 mL/hr over 50 Minutes Intravenous Every 24 hours 09/19/16 0345 09/19/16 0346   09/20/16 1400  fluconazole (DIFLUCAN) IVPB 800 mg  Status:  Discontinued     800 mg 200 mL/hr over 120 Minutes Intravenous  Once 09/20/16 1220 09/20/16 1221   09/20/16 1400  fluconazole (DIFLUCAN) IVPB 400 mg  Status:  Discontinued     400 mg 100 mL/hr over 120 Minutes Intravenous Every 24 hours 09/20/16 1221 09/24/16 1137   09/19/16 2200  anidulafungin (ERAXIS) 100 mg in sodium chloride 0.9  % 100 mL IVPB  Status:  Discontinued     100 mg 78 mL/hr over 100 Minutes Intravenous Every 24 hours 09/19/16 0345 09/20/16 1220   09/19/16 0400  anidulafungin (ERAXIS) 200 mg in sodium chloride 0.9 % 200 mL IVPB     200 mg 78 mL/hr over 200 Minutes Intravenous  Once 09/19/16 0345 09/19/16 0819   09/18/16 1800  fluconazole (DIFLUCAN) IVPB 100 mg  Status:  Discontinued     100 mg 50 mL/hr over 60 Minutes Intravenous Every 24 hours 09/18/16 1109 09/19/16 1937   09/17/16 1800  ceFEPIme (MAXIPIME) 2 g in dextrose 5 % 50 mL IVPB  Status:  Discontinued     2 g 100 mL/hr over 30 Minutes Intravenous Every 24 hours 09/17/16 1243 09/21/16 1200   09/17/16 1800  fluconazole (DIFLUCAN) IVPB 200 mg  Status:  Discontinued     200 mg 100 mL/hr over 60 Minutes Intravenous Every 24 hours 09/17/16 1651 09/18/16 1109   09/12/16 1400  ceFEPIme (MAXIPIME) 1 g in dextrose 5 % 50 mL IVPB  Status:  Discontinued     1 g 100 mL/hr over 30 Minutes Intravenous Every 8 hours 09/12/16 1127 09/17/16 1243   09/11/16 1600  metroNIDAZOLE (FLAGYL) IVPB 500 mg  Status:  Discontinued     500 mg 100 mL/hr over 60 Minutes Intravenous Every 8 hours 09/11/16 0902 09/21/16 1200   09/11/16 1400  ceFEPIme (MAXIPIME) 1 g in dextrose 5 % 50 mL IVPB  Status:  Discontinued     1 g 100 mL/hr over 30 Minutes Intravenous Every 24 hours 09/11/16 1234 09/12/16 1127   09/10/16 2000  vancomycin (VANCOCIN) IVPB 1000 mg/200 mL premix  Status:  Discontinued     1,000 mg 200 mL/hr over 60 Minutes Intravenous Every 24 hours 09/24/2016 2130 09/10/16 1616   09/10/16 1800  vancomycin (VANCOCIN) IVPB 750 mg/150 ml premix  Status:  Discontinued     750 mg 150 mL/hr over 60 Minutes Intravenous Every 12 hours 09/10/16 1616 09/12/16 1145   09/10/16 1400  levofloxacin (LEVAQUIN) IVPB 500 mg  Status:  Discontinued     500 mg 100 mL/hr over 60 Minutes Intravenous Every 24 hours 09/27/2016 2112 09/11/16 1220   09/10/16 0115  vancomycin (VANCOCIN) IVPB 1000  mg/200 mL premix  Status:  Discontinued     1,000 mg 200 mL/hr over 60 Minutes Intravenous  Once 09/10/16 0110 09/10/16 0118   09/07/2016 2200  metroNIDAZOLE (FLAGYL) IVPB 500 mg  Status:  Discontinued     500 mg 100 mL/hr over 60 Minutes Intravenous Every 6 hours 09/24/2016 2110 09/11/16 0902   09/26/2016 2115  vancomycin (VANCOCIN) IVPB 1000 mg/200 mL premix     1,000 mg 200 mL/hr over 60 Minutes Intravenous  Once 09/19/2016 2108 10/02/2016 2323   09/20/2016 1430  levofloxacin (LEVAQUIN) IVPB 750 mg     750 mg 100 mL/hr over 90 Minutes Intravenous  Once 10/02/2016 1415 10/02/2016 2152   09/23/2016 1430  metroNIDAZOLE (FLAGYL) IVPB 500 mg     500 mg 100 mL/hr over 60 Minutes Intravenous  Once 09/02/2016 1415 09/13/2016 1850   09/10/2016 1430  levofloxacin (LEVAQUIN) IVPB 750 mg  Status:  Discontinued     750 mg 100 mL/hr over 90 Minutes Intravenous  Once 09/25/2016 1416 09/07/2016 1416      Assessment/Plan: Patient Active Problem List   Diagnosis Date Noted  . Acute hypercapnic respiratory failure ([place])   . Metabolic encephalopathy   . Pulmonary edema cardiac cause ([place])   . QT prolongation 10/06/2016  . Cardiac LV ejection fraction 30-35%   . DNR (do not resuscitate) discussion   . Congestive heart failure ([place])   . Heme positive stool   . Acute systolic (congestive) heart failure ([place])   . Depression   . Candidemia ([place]) 09/21/2016  . Hypophosphatemia 09/21/2016  . Hypomagnesemia 09/21/2016  . Palliative care by specialist   . Adult failure to thrive   . Protein-calorie malnutrition, severe 09/12/2016  . Sepsis ([place]) 09/19/2016  . Hypotension 09/04/2016  . AKI (acute kidney injury) ([place]) 09/19/2016  . Dehydration 09/04/2016  . Generalized weakness 10/01/2016  . Anemia 09/07/2016  . Thrombocytopenia ([place]) 09/17/2016  . Abnormal computed tomography angiography (CTA) of abdomen and pelvis 09/02/2016  . Colitis: Probable 09/20/2016  . AAA (abdominal aortic aneurysm) ([place])   . Abdominal pain   .  Ischemic enteritis ([place])   . Benign essential HTN   . Diabetes mellitus type 2 in obese ([place])   . Anxiety state   . Tachycardia   . Tachypnea   . Hyponatremia   . Leukocytosis   . Acute blood loss anemia   . 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 Unstable angina pectoris ([place])   . SOB (shortness of breath)   . 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 Change in stool 05/28/2016  . Gastroparesis 11/19/2011  . Nausea 11/19/2011  . Dysphagia 11/11/2011  . GERD (gastroesophageal reflux disease) 11/11/2011     PSBO  Bowel moving   Try fulls   D/C NGT   LOS: 37 days    Haley Graham A. 10/16/2016

## 2016-10-16 NOTE — Progress Notes (Signed)
RT called to bedside to remove pt from BIPAP, due to pt consistently pulling it off. PT very confused, RT placed pt on 6L Nasal Canula, VS are stable and RT to cont to monitor. RN aware.

## 2016-10-16 NOTE — Progress Notes (Signed)
Called by RN for patient still tachypneic and tachycardic throughout day and remains on BIPAP.  Requesting second RN opinion.  RT at bedside to draw ABG.  Primary MD at bedside.  MD spoke with critical care and critical care to bedside.  PCXR per MD.  As per Critical care patient to remain in SDU for now and RN to continue to monitor

## 2016-10-17 ENCOUNTER — Inpatient Hospital Stay (HOSPITAL_COMMUNITY): Payer: BLUE CROSS/BLUE SHIELD

## 2016-10-17 LAB — CBC WITH DIFFERENTIAL/PLATELET
Basophils Absolute: 0 10*3/uL (ref 0.0–0.1)
Basophils Relative: 0 %
Eosinophils Absolute: 0 10*3/uL (ref 0.0–0.7)
Eosinophils Relative: 0 %
HEMATOCRIT: 25.6 % — AB (ref 36.0–46.0)
Hemoglobin: 8 g/dL — ABNORMAL LOW (ref 12.0–15.0)
LYMPHS PCT: 15 %
Lymphs Abs: 2.1 10*3/uL (ref 0.7–4.0)
MCH: 30.9 pg (ref 26.0–34.0)
MCHC: 31.3 g/dL (ref 30.0–36.0)
MCV: 98.8 fL (ref 78.0–100.0)
MONO ABS: 0.8 10*3/uL (ref 0.1–1.0)
MONOS PCT: 6 %
NEUTROS ABS: 11.4 10*3/uL — AB (ref 1.7–7.7)
Neutrophils Relative %: 79 %
Platelets: 170 10*3/uL (ref 150–400)
RBC: 2.59 MIL/uL — ABNORMAL LOW (ref 3.87–5.11)
RDW: 19.3 % — AB (ref 11.5–15.5)
WBC: 14.3 10*3/uL — ABNORMAL HIGH (ref 4.0–10.5)

## 2016-10-17 LAB — COMPREHENSIVE METABOLIC PANEL
ALBUMIN: 1.3 g/dL — AB (ref 3.5–5.0)
ALK PHOS: 511 U/L — AB (ref 38–126)
ALT: 16 U/L (ref 14–54)
AST: 18 U/L (ref 15–41)
Anion gap: 5 (ref 5–15)
BUN: 12 mg/dL (ref 6–20)
CALCIUM: 7.3 mg/dL — AB (ref 8.9–10.3)
CO2: 24 mmol/L (ref 22–32)
CREATININE: 0.96 mg/dL (ref 0.44–1.00)
Chloride: 113 mmol/L — ABNORMAL HIGH (ref 101–111)
GFR calc Af Amer: >60 mL/min (ref >60)
GFR calc non Af Amer: >60 mL/min (ref >60)
GLUCOSE: 97 mg/dL (ref 65–99)
Potassium: 3.1 mmol/L — ABNORMAL LOW (ref 3.5–5.1)
Sodium: 142 mmol/L (ref 135–145)
Total Bilirubin: 1 mg/dL (ref 0.3–1.2)
Total Protein: 4.7 g/dL — ABNORMAL LOW (ref 6.5–8.1)

## 2016-10-17 LAB — MAGNESIUM: Magnesium: 1.9 mg/dL (ref 1.7–2.4)

## 2016-10-17 LAB — GLUCOSE, CAPILLARY: Glucose-Capillary: 100 mg/dL — ABNORMAL HIGH (ref 65–99)

## 2016-10-17 LAB — PHOSPHORUS: Phosphorus: 3.9 mg/dL (ref 2.5–4.6)

## 2016-10-17 MED ORDER — POTASSIUM CHLORIDE 20 MEQ/15ML (10%) PO SOLN
40.0000 meq | Freq: Once | ORAL | Status: AC
  Administered 2016-10-17: 40 meq via ORAL
  Filled 2016-10-17: qty 30

## 2016-10-17 MED ORDER — FUROSEMIDE 10 MG/ML IJ SOLN
40.0000 mg | Freq: Once | INTRAMUSCULAR | Status: AC
  Administered 2016-10-17: 40 mg via INTRAVENOUS
  Filled 2016-10-17: qty 4

## 2016-10-17 MED ORDER — POTASSIUM CHLORIDE 10 MEQ/100ML IV SOLN
10.0000 meq | INTRAVENOUS | Status: AC
  Administered 2016-10-17 (×3): 10 meq via INTRAVENOUS
  Filled 2016-10-17: qty 100

## 2016-10-17 NOTE — Progress Notes (Signed)
[facility] PCCM  S: slept well, resting on nasal cannula  O: Vitals:   10/16/16 2027 10/16/16 2300 10/17/16 0023 10/17/16 0417  BP:    (!) 153/75  Pulse:   96 (!) 107  Resp:  20    Temp: 98.4 F (36.9 C)  97.6 F (36.4 C) (!) 96.3 F (35.7 C)  TempSrc: Oral  Axillary Axillary  SpO2:  100%    Weight:      Height:          Gen: chronically ill appearing, resting comfortably in bed HENT: OP clear, neck supple PULM: Crackles bilaterally, normal percussion CV: RRR, no mgr, trace edema GI: BS+, soft, nontender Derm: scattered bruising, thin skin Neuro: sleeping  CXR reviewed: interstitial edema, bilateral effusions  CBC    Component Value Date/Time   WBC 14.3 (H) 10/17/2016 0505   RBC 2.59 (L) 10/17/2016 0505   HGB 8.0 (L) 10/17/2016 0505   HCT 25.6 (L) 10/17/2016 0505   PLT 170 10/17/2016 0505   MCV 98.8 10/17/2016 0505   MCH 30.9 10/17/2016 0505   MCHC 31.3 10/17/2016 0505   RDW 19.3 (H) 10/17/2016 0505   LYMPHSABS 2.1 10/17/2016 0505   MONOABS 0.8 10/17/2016 0505   EOSABS 0.0 10/17/2016 0505   BASOSABS 0.0 10/17/2016 0505     Impression/Plan:  Acute respiratory failure with hypoxemia due to acute pulmonary edema: improving, continue lasix x1 dose today, wean O2 to maintain O2 saturation > 90%  pccm will sign off   Roselie Awkward, MD [facility] Pager: [phone] Cell: [phone] After 3pm or if no response, call [phone]

## 2016-10-17 NOTE — Progress Notes (Signed)
Progress Note  Patient Name: Haley Graham Date of Encounter: 10/17/2016  Primary Cardiologist: New  Subjective   Continues to have episodes of flash pulmonary edema, improving with diuretics. Now feeling better, on O2 nasal cannula. She is calm and oriented. Husband Haley Graham in room. Alprazolam helped a lot. She was probably having some degree of benzodiazepine withdrawal as part of her delirium last couple of days during episode of SBO.  Inpatient Medications    Scheduled Meds: . ALPRAZolam  0.25 mg Oral QHS  . aspirin EC  81 mg Oral Daily  . dronabinol  5 mg Oral BID AC  . feeding supplement (ENSURE ENLIVE)  237 mL Oral TID BM  . insulin aspart  0-5 Units Subcutaneous QHS  . insulin aspart  0-9 Units Subcutaneous TID WC  . lisdexamfetamine  20 mg Oral Daily  . metoprolol tartrate  7.5 mg Intravenous Q6H  . scopolamine  1 patch Transdermal Q72H  . sodium chloride flush  10-40 mL Intracatheter Q12H  . sodium chloride flush  3 mL Intravenous Q12H   Continuous Infusions: . anidulafungin Stopped (10/16/16 1711)  . cefTRIAXone (ROCEPHIN)  IV Stopped (10/16/16 1442)  . famotidine (PEPCID) IV Stopped (10/17/16 1111)  . metronidazole Stopped (10/17/16 1111)  . potassium chloride 10 mEq (10/17/16 1112)   PRN Meds: acetaminophen **OR** acetaminophen, alum & mag hydroxide-simeth, diphenhydrAMINE, fluticasone, ipratropium-albuterol, lidocaine, menthol-cetylpyridinium, senna-docusate, sodium chloride flush, sodium phosphate   Vital Signs    Vitals:   10/16/16 2027 10/16/16 2300 10/17/16 0023 10/17/16 0417  BP:    (!) 153/75  Pulse:   96 (!) 107  Resp:  20    Temp: 98.4 F (36.9 C)  97.6 F (36.4 C) (!) 96.3 F (35.7 C)  TempSrc: Oral  Axillary Axillary  SpO2:  100%    Weight:      Height:        Intake/Output Summary (Last 24 hours) at 10/17/16 1217 Last data filed at 10/17/16 0515  Gross per 24 hour  Intake              340 ml  Output             1200 ml  Net              -860 ml   Filed Weights   10/12/16 0400 10/13/16 0400 10/14/16 0400  Weight: 154 lb (69.9 kg) 153 lb 1.6 oz (69.4 kg) 154 lb 3.2 oz (69.9 kg)    Telemetry    Sinus tachycardia - Personally Reviewed  ECG    STachy, persistent anterior T wave inversion, V1-V4, QTc 514 ms - Personally Reviewed  Physical Exam  Appears chronically ill, frail. Widespread ecchymoses GEN: No acute distress.   Neck: No JVD Cardiac: RRR, no murmurs, rubs, or gallops.  Respiratory: Clear to auscultation bilaterally. GI: Soft, nontender, non-distended  MS: No edema; No deformity. Neuro:  Nonfocal  Psych: Normal affect   Labs    Chemistry Recent Labs Lab 10/15/16 1912 10/16/16 0448 10/17/16 0505  NA 142 139 142  K 3.4* 3.7 3.1*  CL 114* 114* 113*  CO2 24 21* 24  GLUCOSE 100* 114* 97  BUN '10 12 12  ' CREATININE 0.87 0.98 0.96  CALCIUM 7.1* 7.4* 7.3*  PROT 4.9* 4.9* 4.7*  ALBUMIN 1.3* 1.4* 1.3*  AST '20 21 18  ' ALT '15 15 16  ' ALKPHOS 345* 441* 511*  BILITOT 0.4 0.6 1.0  GFRNONAA >60 >60 >60  GFRAA >60 >60 >60  ANIONGAP 4* 4* 5     Hematology Recent Labs Lab 10/15/16 1946 10/16/16 0448 10/17/16 0505  WBC 12.4* 15.5* 14.3*  RBC 2.62* 2.81* 2.59*  HGB 8.1* 8.7* 8.0*  HCT 25.7* 27.6* 25.6*  MCV 98.1 98.2 98.8  MCH 30.9 31.0 30.9  MCHC 31.5 31.5 31.3  RDW 19.3* 19.1* 19.3*  PLT 146* 169 170    Cardiac Enzymes Recent Labs Lab 10/15/16 0301 10/15/16 1912  TROPONINI 0.05* 0.07*   No results for input(s): TROPIPOC in the last 168 hours.   BNPNo results for input(s): BNP, PROBNP in the last 168 hours.   DDimer No results for input(s): DDIMER in the last 168 hours.   Radiology    Dg Chest Port 1 View  Result Date: 10/17/2016 CLINICAL DATA:  Shortness of breath. EXAM: PORTABLE CHEST 1 VIEW COMPARISON:  Chest x-ray from yesterday. FINDINGS: Unchanged positioning of the right PICC line with the tip near the cavoatrial junction. Stable cardiomegaly. Unchanged thoracic aortic  endograft. Mild pulmonary vascular congestion. Unchanged left basilar atelectasis and adjacent small left pleural effusion. No pneumothorax. IMPRESSION: 1. Stable left basilar atelectasis and small left pleural effusion. 2. Mild pulmonary vascular congestion without overt edema, improved. Electronically Signed   By: Titus Dubin M.D.   On: 10/17/2016 08:32   Dg Chest Port 1 View  Result Date: 10/16/2016 CLINICAL DATA:  61 year old female with shortness of breath EXAM: PORTABLE CHEST 1 VIEW COMPARISON:  Prior chest x-ray 10/15/2016 FINDINGS: Stable cardiomegaly. Thoracic aortic endograft remains in unchanged position compared to prior. A gastric tube is been removed. Persistent bilateral layering pleural effusions slightly larger on the left than the right. Mild vascular congestion with interstitial pulmonary edema. Findings appear slightly improved compared yesterday. No pneumothorax. No acute osseous abnormality. IMPRESSION: 1. Slightly improved interstitial pulmonary edema. 2. Persistent bilateral layering pleural effusions slightly larger on the left than the right. 3. Unchanged position of thoracic aortic stent graft. 4. Stable cardiomegaly. Electronically Signed   By: Jacqulynn Cadet M.D.   On: 10/16/2016 17:51    Cardiac Studies    Echo 10/14/2016 - Left ventricle: The cavity size was normal. There was mild concentric hypertrophy. Systolic function was moderately reduced. The estimated ejection fraction was in the range of 35% to 40%. There is akinesis of the mid-apicalanteroseptal, anterior, anterolateral, lateral, and apical myocardium. The study is not technically sufficient to allow evaluation of LV diastolic function. - Mitral valve: Calcified annulus.  Impressions:  - Relatively unchanged when compared to prior study 10/06/16   Patient Profile     61 y.o. 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 Readmitted in Augustwith sepsis syndrome, fungemiaand concern for enteritis, anemia and thrombocytopenia.  On 25/85IDPOEUMPN acute systolic heart failure and echo shows newly depressed LVEF of 30-35%, with akinesis of the mid apical anterior and anteroseptal segments. Minimal improvement on 2 subsequent echos most recent on 09/12. Developed recurrent pulmonary edema on evening of 9/12 and 9/14, again improved with diuretics.  Waxing and waning QT prolongation and ECG repolarization abnormalities in the anterolateral distribution.  Developed partial small bowel obstruction 10/11/2016, now resolved.  Thrombocytopenia resolved.  Delirium during SBO (benzo w/d?).  Assessment & Plan    1.CHF/Cardiomyopathy:The prolonged course of heart failure/LV dysfunction makes a takotsubo-syndrome less and less likely. LAD ischemia now appears to be the actual  problem. Plan high risk diagnostic and possible interventional cardiac catheterization on Tuesday with Dr. Burt Knack.  It is important that she maintains normal small bowel function, before placing a coronary stent and committing her to dual antiplatelet therapy.  A bare metal stent may be preferable to shorten the duration of DAPT, but she does have DM. A DES may be best if she has a long stenosis in a small caliber vessel. She remains at high risk of complications with the procedure due to the presence of the descending aortic dissection and location of the thoracic aortic stent, which limits access to the right radial approach. Discussed with husband, Haley Graham, at length. This procedure has been fully reviewed with the patient and informed consent has been obtained. 2. Ao dissection: No chest pain.Chest CT August 26 didnot show extension of the dissection to the proximal ascending aorta. On metoprolol, now being  administered intravenously, not quite at desirable BP control. Her Toprol was increased yesterday. We will be better able to control her blood pressure once we can give her oral medications. Blood pressure should be checked only in the right upper extremity.  3. Anemia:8.7 hemoglobin today, up from 8.1 yesterday without transfusion; has dropped a little bit since transfusion 10/01/2016. Thrombocytopenia has resolved. Consider transfusion if hemoglobin drops below 8.  4. QT prolongation:No longer has hypokalemia. Waxing and waning QT could well be explained by coronary insufficiency. Avoid agents that can prolong the QT interval further.  5. Hypokalemia: she has received 40 mEq KCl this AM and I ordered another 40 mEq for the afternoon. 6. Hypocalcemia: Recheck ionized calcium - hypocalcemia is only partly explained by her hypoalbuminemia. I suspect this is the cause of her persistently elevated alk phos. 7. Severe malnutrition: . Albumin showing a slight improvement  despite her episode of small bowel obstruction. Current plan is to resume enteral feeding. 8. WTK:TCCEQFD to be resolved. Will switch beta blocker to oral route. 9. Delirium: multifactorial. Greatly improved today, not entirely gone. Was it partly due to benzodiazepine withdrawal? Should we stop her scopolamine patch?   For questions or updates, please contact [facility] Please consult [email or web address] for contact info under Cardiology/STEMI.      Signed, Sanda Klein, MD  10/17/2016, 12:17 PM

## 2016-10-17 NOTE — Progress Notes (Signed)
Cortrak Tube Team Note:  Consult received to place a Cortrak feeding tube.   Patient had cortrak tube in past. Was removed in favor of larger bore NGT when patient developed SBO and required suction.When TF was infusing, she could never tolerate >30 cc/hr of Vital 1.5. This had provided her with 1080 kcals.   Today is the first day patient has truly had chance to Eat. Diet was only advanced yesterday, however was on BIPAP.   Today, husband reports the patient consumed an entire Ensure, an ice cream cup, 2 bites of peaches and half an container of ice chips. He says the patient had a good bowel movement today and has not been as nauseated with PO intake as usual. He believes this is the best she has done in a while and would like to give patient more time to meet needs w/ oral diet.   What patient consumed today likely only provided 600-700 kcals, she likely needs twice this. Husband says "today isnt over". He really desires to wait until Monday as he is confident she is doing better than has in past.   RD spoke with CCS MD on floor. He felt waiting until Monday for tube was reasonable.   Will reorder calorie count. Can be re-evaluated Monday.   Burtis Junes RD, LDN, CNSC Clinical Nutrition Pager: [phone] 10/17/2016 3:23 PM

## 2016-10-17 NOTE — Progress Notes (Signed)
MD called to assess patient with increasing confusion and fighting the bipap. ABG ordered. See results. Patient has not slept for at least two nights. This RN was able to get the patient to take her nighttime xanax. Patient is sleeping and resting comfortably. Patient's vitals continue to remain stable. Will continue to monitor

## 2016-10-17 NOTE — Progress Notes (Signed)
Patient ID: Haley Graham, female   DOB: 03-18-1955, 61 y.o.   MRN: [id number]  [facility] Surgery Progress Note     Subjective: CC- SBO Patient with increased confusion and fighting BiPAP over night, rapid response called. Respiratory status now improved. She denies abdominal pain. Denies n/v. Had 2 BM's yesterday. On full liquid diet but has not taken anything in by mouth.  Objective: Vital signs in last 24 hours: Temp:  [96.3 F (35.7 C)-98.4 F (36.9 C)] 96.3 F (35.7 C) (09/15 0417) Pulse Rate:  [96-110] 107 (09/15 0417) Resp:  [17-27] 20 (09/14 2300) BP: (146-153)/(37-75) 153/75 (09/15 0417) SpO2:  [96 %-100 %] 100 % (09/14 2300) Last BM Date: 10/16/16  Intake/Output from previous day: 09/14 0701 - 09/15 0700 In: 340 [I.V.:10; IV Piggyback:330] Out: 1200 [Urine:1200] Intake/Output this shift: No intake/output data recorded.  PE: Gen: Alert, NAD, pleasant HEENT: EOM's intact, pupils equal and round Card: tachy Pulm: CTAB, no W/R/R, effort normal Abd: Soft, nondistended, + BS, no HSM, no hernia, nontender Psych: A&Ox3  Skin: no rashes noted, warm and dry   Lab Results:   Recent Labs  10/16/16 0448 10/17/16 0505  WBC 15.5* 14.3*  HGB 8.7* 8.0*  HCT 27.6* 25.6*  PLT 169 170   BMET  Recent Labs  10/16/16 0448 10/17/16 0505  NA 139 142  K 3.7 3.1*  CL 114* 113*  CO2 21* 24  GLUCOSE 114* 97  BUN 12 12  CREATININE 0.98 0.96  CALCIUM 7.4* 7.3*   PT/INR No results for input(s): LABPROT, INR in the last 72 hours. CMP     Component Value Date/Time   NA 142 10/17/2016 0505   K 3.1 (L) 10/17/2016 0505   CL 113 (H) 10/17/2016 0505   CO2 24 10/17/2016 0505   GLUCOSE 97 10/17/2016 0505   BUN 12 10/17/2016 0505   CREATININE 0.96 10/17/2016 0505   CALCIUM 7.3 (L) 10/17/2016 0505   PROT 4.7 (L) 10/17/2016 0505   ALBUMIN 1.3 (L) 10/17/2016 0505   AST 18 10/17/2016 0505   ALT 16 10/17/2016 0505   ALKPHOS 511 (H) 10/17/2016 0505   BILITOT 1.0  10/17/2016 0505   GFRNONAA >60 10/17/2016 0505   GFRAA >60 10/17/2016 0505   Lipase     Component Value Date/Time   LIPASE 14 09/08/2016 1139       Studies/Results: Dg Chest Port 1 View  Result Date: 10/17/2016 CLINICAL DATA:  Shortness of breath. EXAM: PORTABLE CHEST 1 VIEW COMPARISON:  Chest x-ray from yesterday. FINDINGS: Unchanged positioning of the right PICC line with the tip near the cavoatrial junction. Stable cardiomegaly. Unchanged thoracic aortic endograft. Mild pulmonary vascular congestion. Unchanged left basilar atelectasis and adjacent small left pleural effusion. No pneumothorax. IMPRESSION: 1. Stable left basilar atelectasis and small left pleural effusion. 2. Mild pulmonary vascular congestion without overt edema, improved. Electronically Signed   By: Titus Dubin M.D.   On: 10/17/2016 08:32   Dg Chest Port 1 View  Result Date: 10/16/2016 CLINICAL DATA:  61 year old female with shortness of breath EXAM: PORTABLE CHEST 1 VIEW COMPARISON:  Prior chest x-ray 10/15/2016 FINDINGS: Stable cardiomegaly. Thoracic aortic endograft remains in unchanged position compared to prior. A gastric tube is been removed. Persistent bilateral layering pleural effusions slightly larger on the left than the right. Mild vascular congestion with interstitial pulmonary edema. Findings appear slightly improved compared yesterday. No pneumothorax. No acute osseous abnormality. IMPRESSION: 1. Slightly improved interstitial pulmonary edema. 2. Persistent bilateral layering pleural effusions slightly  larger on the left than the right. 3. Unchanged position of thoracic aortic stent graft. 4. Stable cardiomegaly. Electronically Signed   By: Jacqulynn Cadet M.D.   On: 10/16/2016 17:51    Anti-infectives: Anti-infectives    Start     Dose/Rate Route Frequency Ordered Stop   10/16/16 1400  cefTRIAXone (ROCEPHIN) 2 g in dextrose 5 % 50 mL IVPB     2 g 100 mL/hr over 30 Minutes Intravenous Every 24  hours 10/16/16 0810     10/14/16 1500  anidulafungin (ERAXIS) 100 mg in sodium chloride 0.9 % 100 mL IVPB     100 mg 78 mL/hr over 100 Minutes Intravenous Every 24 hours 10/13/16 1018 11/25/16 1459   10/13/16 1100  cefTRIAXone (ROCEPHIN) 2 g in dextrose 5 % 50 mL IVPB - Premix  Status:  Discontinued     2 g 100 mL/hr over 30 Minutes Intravenous Every 24 hours 10/13/16 1016 10/16/16 0810   10/13/16 1100  metroNIDAZOLE (FLAGYL) IVPB 500 mg     500 mg 100 mL/hr over 60 Minutes Intravenous Every 8 hours 10/13/16 1016     10/13/16 1030  anidulafungin (ERAXIS) 200 mg in sodium chloride 0.9 % 200 mL IVPB     200 mg 78 mL/hr over 200 Minutes Intravenous  Once 10/13/16 1018 10/13/16 2052   09/27/16 2200  erythromycin 250 mg in sodium chloride 0.9 % 100 mL IVPB  Status:  Discontinued     250 mg 100 mL/hr over 60 Minutes Intravenous Every 8 hours 09/27/16 1842 09/27/16 1959   09/27/16 2200  erythromycin (EES) 400 MG/5ML suspension 250 mg  Status:  Discontinued     250 mg Oral Every 8 hours 09/27/16 1959 09/29/16 1147   09/24/16 1300  fluconazole (DIFLUCAN) tablet 400 mg  Status:  Discontinued     400 mg Oral Daily 09/24/16 1138 10/13/16 0953   09/21/16 1400  fluconazole (DIFLUCAN) IVPB 400 mg  Status:  Discontinued     400 mg 100 mL/hr over 120 Minutes Intravenous Every 24 hours 09/20/16 1220 09/20/16 1221   09/21/16 0000  anidulafungin (ERAXIS) 50 mg in sodium chloride 0.9 % 50 mL IVPB  Status:  Discontinued     50 mg 78 mL/hr over 50 Minutes Intravenous Every 24 hours 09/19/16 0345 09/19/16 0346   09/20/16 1400  fluconazole (DIFLUCAN) IVPB 800 mg  Status:  Discontinued     800 mg 200 mL/hr over 120 Minutes Intravenous  Once 09/20/16 1220 09/20/16 1221   09/20/16 1400  fluconazole (DIFLUCAN) IVPB 400 mg  Status:  Discontinued     400 mg 100 mL/hr over 120 Minutes Intravenous Every 24 hours 09/20/16 1221 09/24/16 1137   09/19/16 2200  anidulafungin (ERAXIS) 100 mg in sodium chloride 0.9 % 100 mL  IVPB  Status:  Discontinued     100 mg 78 mL/hr over 100 Minutes Intravenous Every 24 hours 09/19/16 0345 09/20/16 1220   09/19/16 0400  anidulafungin (ERAXIS) 200 mg in sodium chloride 0.9 % 200 mL IVPB     200 mg 78 mL/hr over 200 Minutes Intravenous  Once 09/19/16 0345 09/19/16 0819   09/18/16 1800  fluconazole (DIFLUCAN) IVPB 100 mg  Status:  Discontinued     100 mg 50 mL/hr over 60 Minutes Intravenous Every 24 hours 09/18/16 1109 09/19/16 1937   09/17/16 1800  ceFEPIme (MAXIPIME) 2 g in dextrose 5 % 50 mL IVPB  Status:  Discontinued     2 g 100 mL/hr over 30 Minutes  Intravenous Every 24 hours 09/17/16 1243 09/21/16 1200   09/17/16 1800  fluconazole (DIFLUCAN) IVPB 200 mg  Status:  Discontinued     200 mg 100 mL/hr over 60 Minutes Intravenous Every 24 hours 09/17/16 1651 09/18/16 1109   09/12/16 1400  ceFEPIme (MAXIPIME) 1 g in dextrose 5 % 50 mL IVPB  Status:  Discontinued     1 g 100 mL/hr over 30 Minutes Intravenous Every 8 hours 09/12/16 1127 09/17/16 1243   09/11/16 1600  metroNIDAZOLE (FLAGYL) IVPB 500 mg  Status:  Discontinued     500 mg 100 mL/hr over 60 Minutes Intravenous Every 8 hours 09/11/16 0902 09/21/16 1200   09/11/16 1400  ceFEPIme (MAXIPIME) 1 g in dextrose 5 % 50 mL IVPB  Status:  Discontinued     1 g 100 mL/hr over 30 Minutes Intravenous Every 24 hours 09/11/16 1234 09/12/16 1127   09/10/16 2000  vancomycin (VANCOCIN) IVPB 1000 mg/200 mL premix  Status:  Discontinued     1,000 mg 200 mL/hr over 60 Minutes Intravenous Every 24 hours 09/22/2016 2130 09/10/16 1616   09/10/16 1800  vancomycin (VANCOCIN) IVPB 750 mg/150 ml premix  Status:  Discontinued     750 mg 150 mL/hr over 60 Minutes Intravenous Every 12 hours 09/10/16 1616 09/12/16 1145   09/10/16 1400  levofloxacin (LEVAQUIN) IVPB 500 mg  Status:  Discontinued     500 mg 100 mL/hr over 60 Minutes Intravenous Every 24 hours 09/22/2016 2112 09/11/16 1220   09/10/16 0115  vancomycin (VANCOCIN) IVPB 1000 mg/200 mL  premix  Status:  Discontinued     1,000 mg 200 mL/hr over 60 Minutes Intravenous  Once 09/10/16 0110 09/10/16 0118   09/14/2016 2200  metroNIDAZOLE (FLAGYL) IVPB 500 mg  Status:  Discontinued     500 mg 100 mL/hr over 60 Minutes Intravenous Every 6 hours 09/06/2016 2110 09/11/16 0902   09/12/2016 2115  vancomycin (VANCOCIN) IVPB 1000 mg/200 mL premix     1,000 mg 200 mL/hr over 60 Minutes Intravenous  Once 10/02/2016 2108 09/06/2016 2323   09/16/2016 1430  levofloxacin (LEVAQUIN) IVPB 750 mg     750 mg 100 mL/hr over 90 Minutes Intravenous  Once 09/07/2016 1415 09/03/2016 2152   09/25/2016 1430  metroNIDAZOLE (FLAGYL) IVPB 500 mg     500 mg 100 mL/hr over 60 Minutes Intravenous  Once 09/16/2016 1415 09/15/2016 1850   10/01/2016 1430  levofloxacin (LEVAQUIN) IVPB 750 mg  Status:  Discontinued     750 mg 100 mL/hr over 90 Minutes Intravenous  Once 09/30/2016 1416 09/25/2016 1416       Assessment/Plan Sepsis due to candidemia Splenic infarcts CHF Prolonged QTC Anemia Depression Respiratory failure/Acute pulmonary edema - improving CHF/cardiomyopathy - LAD ischemia. Considering cath next week if patient tolerating PO's as she would need to be on dual antiplatelet therapy if stent is placed. Severe protein calorie malnutrition - cleared for FLD but patient not taking in many PO's. Will have Cortrak replaced.  SBO - CT scan 9/11 showed early or partial small bowel obstruction with transition zone in the right lower quadrant - having bowel function  FEN - IVF,  FLD, ordered Cortrak VTE - SCDs Foley - in place Follow up - TBD  Plan - Continue full liquids as tolerated. Patient not taking in many PO's. Discussed with internal medicine, will order Cortrak for tube feeds. Encourage OOB.   LOS: 38 days    Wellington Hampshire , [facility] Surgery 10/17/2016, 10:18 AM Pager:  [phone] Consults: [phone] Mon-Fri 7:00 am-4:30 pm Sat-Sun 7:00 am-11:30 am

## 2016-10-17 NOTE — Progress Notes (Signed)
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 She presented with weakness and met sepsis criteria initially with concern for enteritis.  She grew a yeast UTI and on 8/18 1/2 blood cultures grew yeast and the patient was started on eraxis.  She was switched to fluconazole and antibiotics were d/c'd on 8/20 with blood cultures only growing candida.  ID recommended continuing fluconazole 400 mg daily through 9/26 and recommended ophtho exam within the next month.     She had an echo on 8/28 which showed new systolic dysfunction and her QT was markedly prolonged so she was changed back from fluconazole to Eraxis until 9/26.  Cardiology was consulted and she was diuresed.   On 9/10 she developed new nausea and vomiting.  KUB showed concern for a SBO.  CT confirmed this and also showed evidence of possible enteritis.  Repeat blood cultures obtained and started on ceftriaxone/flagyl.   Given severe malnutrition will need to consider TPN for Haley Graham and won't be able to place PICC until blood Cx are Negative x 3 days per ID reccs.   On 9/12 ECHO was repeated and was essentially the same; Deferring to Cardiology for further invasive procedures such as angiography and likely to be done early next if stable  Patient had Rapid Response on the night of 10/15/16 and ended up on BiPAP and being diuresed. She improved but continued to be delirious. This AM NGT was removed by Surgery and patient was placed on FULL Liquid  Diet. TPN was ordered for Patient but held due to evaluating if she could tolerate a FULL Diet and possibly TF through Cortrak.  Another Rapid Response on 10/16/16 was called on the patient as patient was increasingly Tachypenic on BiPAP. I went to assess the patient and ordered a Stat CXR, ABG, and gave an additional 40 of IV Lasix. Had some flash pulmonary edema. Patient was maintaining saturations but still had a tenuous respiratory status so PCCM was called to evaluate and after evaluation patient improved and will remain in SDU with low threshold to transfer to ICU if she deteriorates. PCCM evaluated today and because patient was doing better they signed off the case.   This AM patient was resting well, and per husband was the best she has rested in a while. Discussed plan of care with Surgery and were to place a Cortrak for TF however husband did not want to have it placed today and will hold off and re-assess Monday for TF.   Assessment & Plan:   Principal Problem:   Candidemia (HCC) Active Problems:   GERD (gastroesophageal reflux disease)   Dissection of thoracoabdominal aorta (HCC)   AAA (abdominal aortic aneurysm) (HCC)   Benign essential HTN   Diabetes mellitus type 2 in obese (HCC)   Anxiety state   Hyponatremia   Leukocytosis   Sepsis (HCC)   Hypotension   AKI (acute kidney injury) (HCC)   Dehydration   Generalized weakness   Anemia   Thrombocytopenia (HCC)   Abnormal computed tomography angiography (CTA) of abdomen and pelvis   Colitis: Probable   Protein-calorie malnutrition,  severe   Palliative care by specialist   Adult failure to thrive   Hypophosphatemia   Hypomagnesemia   Depression   Acute systolic (congestive) heart failure (HCC)   DNR (do not resuscitate) discussion   Congestive heart failure (HCC)   Heme positive stool   Cardiac LV ejection fraction 30-35%   QT prolongation   Acute hypercapnic respiratory failure (HCC)   Metabolic encephalopathy    Pulmonary edema cardiac cause (HCC)   Acute respiratory distress   Encephalopathy acute  Acute Hypercapnic Respiratory Failure Requiring NIPPV -Decompensated overnight and ended up on BiPAP -Given Aggressive Diuresis with IV Lasix; Given additional IV Lasix during rapid -PCCM Evaluated and Improved   -ABG last inight improved -Transitioned to [place] but now back on BiPAP -C/w NIPPV with BiPAP qHS and prn -PCCM Signed off  Small Bowel Obstruction  Enteritis?, improving - Surgery followin, SB, protocol, KUB showed no convincing radiographic improvement in apparent mid/distal small bowel obstruction. -C/w ceftriaxone/flagyl for evidence of ? enteritis, although with her history, may be related to mesenteric ischemia. repeat blood cx as well due to tachy, but normal WBC, without fevers. Low Threshold to de-escalate  - D5 NS at 75 mL/hr ordered x 3 days stopped as patient went into volume overload and flash pulmonary edema -NGT Removed -Diet Advanced to FULL's -Patient ordered TPN but will Hold off given tenuous Respiratory Status and want to see if she tolerates FULLS and possibly another Cortrak with TF; -Discussed case with Surgery PA who thought Cortrak was a good idea but patient tolerated FULLS and husband wants to re-assess Cortrak on Monday.   Sepsis due to Candidemia, improved  -Sepsis Physiology improved  - discussed with ID, ID to see, switched to eraxis (caspofungin not on formulary) after discussion with ID -> continue until 9/26  -Case was discussed with Dr. Valetta Close 9/7 who recommended follow up after discharge for ophthalmologic exam -Some concern for thrush, ctm on eraxis, she's been on fluconazole  -Per ID continue Anidulafungin for 6 weeks; PICC to be placed after recent cultures 9/11 NG x at least 72h. Will need double lumen for TNA + prolonged antifungal course -PICC placed 10/15/16 at the recommendation of ID; Was going to start TPN but will hold off for now   Splenic  Infarcts:   -Unclear cause, new since previous study per rads.   -Possibly related to thrombosis or dissection within aorta, though thrombosis is infrarenal and celiac comes off true lumen.    Acute systolic congestive heart failure:  - Holding lasix 20 mg PO daily, with tachycardia/N/V with above SBO -Repeat echo with EF 35-40% and grade 2 diastolic dysfunction as well as hypokinesis of mid-apicalanteroseptal, anterior, and inferolateral myocardium (mildly improved from 8/28 echo at 30-35%) -Follow up cardiology recs Cardiology rec f/u in clinic in 4-6 weeks with repeat echo -> Cards repeated ECHO 10/15/11 which was essentially unchanged -May needLexiscan Myoview versus cath but will defer to Cardiology.Denied shortness of breath or chest pain today. Continue to monitor urine output and electrolytes. Cardiology consult appreciated. -C/w Diuresis per Cards; Gave an additonal IV 40 mg of Lasix during Rapid Yesterday.  -IV Furosemide 40 mg given this AM by PCCM -On beta blocker, consider ace/arb in future -Cardiology would potentially consider invasive angiography and likely compelled to proceed with Coronary Angiography to clarify diagnosis as it is less likely stress cardiomyopathy and likely LAD stenosis and ischemia. -Patient unstable for Cardiac Cath at this time and it is tentatively scheduled for early next  week (Tuesday); Cards recommending implantation of Bare-metal Stent  Prolonged QTC: Over 600 initially and improved.   -D/c fluconazole.  D/c tramadol and hold cymbalta.  Zofran discontinued.  -Discontinue Azithromycin and abilify.  Monitor in telemetry. Repeat EKG.  Tachycardia:  -likely 2/2 SBO/N/V above.  Sinus tach on EKG.  Will continue to monitor. Gentle IVF.  Bolus 250 cc and repeat prn if tolerated well.   Severe protein calorie malnutrition:  -Nutritionist Consulted -Cortrak was dc'd.  Will need to consider TPN again but now that NGT is removed will hold off and see if  patient will tolerate FULLs and another Cortrak to allow for TF.   -PICC Placed incase TPN to be started again -Cortrak to be placed today but patient tolerated her diet and Husband wants to hold off and re-evaluate Monday   Acute kidney Injury  -Improved; BUN/Cr is stable at 12/0.96 -Continue to Monitor CMP's  Acute on Chronic anemia likely due to chronic disease vs acute GI bleed:  -Iron stores acceptable. Fecal occult blood test positive. On Protonix. Status post 2 unit of red blood cell transfusion on 8/30 with improvement in hemoglobin. Evaluated by GI. No further endoscopy at this time. -Hemoglobin level stable at 8.0/25.6 -Monitor CBC  Thrombocytopenia likely due to acute illness:  -Monitor CBC. Platelet count up and down over past few days.  -Continue to Monitor; Platelet Count was 136 and improved to 170  Depression and Anxiety:  -Evaluated by psychiatrist.   -Continue Xanax, holding cymbalta, andVyvanse per psychiatrist  Hypomagnesemia -Mag was 1.6 and improved to 1.9 -Replete as necessary  -Repeat Mag Level in AM  Loose stools:  -Negative C. Diff; Possibly from SBO -Had two incontinent stools today.   Acute Delirium, improved slightly -C/w Delirium precautions -Continue to Redirect  -Continue to try to treat underlying causes -Resting comfortably and not as confused  Hypokalemia -Patient's K+ was 3.1 -Replete as necessary  -Continue to Monitor  Goals of care:  -Palliative follow up with patient/family yesteday. Had a discussion with Husband about Mrs. Styles.  Discussed that she was extremely sick and that it is hard to predict when these new complications would resolve (both asked about when she might be able to get to the SNF). -Discussed need for TPN again as well and will consider starting in AM.    Leukocytosis, improved slightly -On Abx -WBC went from 12.4 -> 15.5 -> 14.3 -?Reactive -Recent Blood Cx show NGTD at 4 days -May need to  Pan-Culture Again and broaden Abx as she is on Ceftriaxone and Flagyl -Continue to Monitor and Repeat CBC in AM   DVT prophylaxis: SCDs Code Status: FULL CODE Family Communication: Discussed with Husband  Disposition Plan: SNF when medically Stable  Consultants:   Cardiology  Psychiatry  Infectious Diseases  Palliative Care Medicine  Gastroenterology  General Surgery  Nutrition  Discussed Case with Vascular   PCCM   Procedures:   Thoracentesis 8/17  Echo 8/28 EF 30-35%, akinesis of mid-apicalanteroseptal and anterior myocardium. Diastolic dysfunction.  Echo 8/13 EF 84-16, diastolic dysfunction, lipomatous hypertrophy  Echo 9/4 EF 35-40%, hypokinesis of mid apicalanteroseptal, anterior, and inferolateral myocardium, grade 2 diastolic dysfunction -Echo 9/12 There was mild concentric hypertrophy. Systolic function was moderately reduced. The estimated ejection fraction was in the range of 35% to 40%. There is akinesis of the mid-apicalanteroseptal, anterior,   anterolateral, lateral, and apical myocardium. The study is not technically sufficient to allow evaluation of LV diastolic   Antimicrobials:  Anti-infectives  Start     Dose/Rate Route Frequency Ordered Stop   10/16/16 1400  cefTRIAXone (ROCEPHIN) 2 g in dextrose 5 % 50 mL IVPB     2 g 100 mL/hr over 30 Minutes Intravenous Every 24 hours 10/16/16 0810     10/14/16 1500  anidulafungin (ERAXIS) 100 mg in sodium chloride 0.9 % 100 mL IVPB     100 mg 78 mL/hr over 100 Minutes Intravenous Every 24 hours 10/13/16 1018 11/25/16 1459   10/13/16 1100  cefTRIAXone (ROCEPHIN) 2 g in dextrose 5 % 50 mL IVPB - Premix  Status:  Discontinued     2 g 100 mL/hr over 30 Minutes Intravenous Every 24 hours 10/13/16 1016 10/16/16 0810   10/13/16 1100  metroNIDAZOLE (FLAGYL) IVPB 500 mg     500 mg 100 mL/hr over 60 Minutes Intravenous Every 8 hours 10/13/16 1016     10/13/16 1030  anidulafungin (ERAXIS) 200 mg in sodium  chloride 0.9 % 200 mL IVPB     200 mg 78 mL/hr over 200 Minutes Intravenous  Once 10/13/16 1018 10/13/16 2052   09/27/16 2200  erythromycin 250 mg in sodium chloride 0.9 % 100 mL IVPB  Status:  Discontinued     250 mg 100 mL/hr over 60 Minutes Intravenous Every 8 hours 09/27/16 1842 09/27/16 1959   09/27/16 2200  erythromycin (EES) 400 MG/5ML suspension 250 mg  Status:  Discontinued     250 mg Oral Every 8 hours 09/27/16 1959 09/29/16 1147   09/24/16 1300  fluconazole (DIFLUCAN) tablet 400 mg  Status:  Discontinued     400 mg Oral Daily 09/24/16 1138 10/13/16 0953   09/21/16 1400  fluconazole (DIFLUCAN) IVPB 400 mg  Status:  Discontinued     400 mg 100 mL/hr over 120 Minutes Intravenous Every 24 hours 09/20/16 1220 09/20/16 1221   09/21/16 0000  anidulafungin (ERAXIS) 50 mg in sodium chloride 0.9 % 50 mL IVPB  Status:  Discontinued     50 mg 78 mL/hr over 50 Minutes Intravenous Every 24 hours 09/19/16 0345 09/19/16 0346   09/20/16 1400  fluconazole (DIFLUCAN) IVPB 800 mg  Status:  Discontinued     800 mg 200 mL/hr over 120 Minutes Intravenous  Once 09/20/16 1220 09/20/16 1221   09/20/16 1400  fluconazole (DIFLUCAN) IVPB 400 mg  Status:  Discontinued     400 mg 100 mL/hr over 120 Minutes Intravenous Every 24 hours 09/20/16 1221 09/24/16 1137   09/19/16 2200  anidulafungin (ERAXIS) 100 mg in sodium chloride 0.9 % 100 mL IVPB  Status:  Discontinued     100 mg 78 mL/hr over 100 Minutes Intravenous Every 24 hours 09/19/16 0345 09/20/16 1220   09/19/16 0400  anidulafungin (ERAXIS) 200 mg in sodium chloride 0.9 % 200 mL IVPB     200 mg 78 mL/hr over 200 Minutes Intravenous  Once 09/19/16 0345 09/19/16 0819   09/18/16 1800  fluconazole (DIFLUCAN) IVPB 100 mg  Status:  Discontinued     100 mg 50 mL/hr over 60 Minutes Intravenous Every 24 hours 09/18/16 1109 09/19/16 1937   09/17/16 1800  ceFEPIme (MAXIPIME) 2 g in dextrose 5 % 50 mL IVPB  Status:  Discontinued     2 g 100 mL/hr over 30  Minutes Intravenous Every 24 hours 09/17/16 1243 09/21/16 1200   09/17/16 1800  fluconazole (DIFLUCAN) IVPB 200 mg  Status:  Discontinued     200 mg 100 mL/hr over 60 Minutes Intravenous Every 24 hours 09/17/16 1651 09/18/16  1109   09/12/16 1400  ceFEPIme (MAXIPIME) 1 g in dextrose 5 % 50 mL IVPB  Status:  Discontinued     1 g 100 mL/hr over 30 Minutes Intravenous Every 8 hours 09/12/16 1127 09/17/16 1243   09/11/16 1600  metroNIDAZOLE (FLAGYL) IVPB 500 mg  Status:  Discontinued     500 mg 100 mL/hr over 60 Minutes Intravenous Every 8 hours 09/11/16 0902 09/21/16 1200   09/11/16 1400  ceFEPIme (MAXIPIME) 1 g in dextrose 5 % 50 mL IVPB  Status:  Discontinued     1 g 100 mL/hr over 30 Minutes Intravenous Every 24 hours 09/11/16 1234 09/12/16 1127   09/10/16 2000  vancomycin (VANCOCIN) IVPB 1000 mg/200 mL premix  Status:  Discontinued     1,000 mg 200 mL/hr over 60 Minutes Intravenous Every 24 hours 09/10/2016 2130 09/10/16 1616   09/10/16 1800  vancomycin (VANCOCIN) IVPB 750 mg/150 ml premix  Status:  Discontinued     750 mg 150 mL/hr over 60 Minutes Intravenous Every 12 hours 09/10/16 1616 09/12/16 1145   09/10/16 1400  levofloxacin (LEVAQUIN) IVPB 500 mg  Status:  Discontinued     500 mg 100 mL/hr over 60 Minutes Intravenous Every 24 hours 09/28/2016 2112 09/11/16 1220   09/10/16 0115  vancomycin (VANCOCIN) IVPB 1000 mg/200 mL premix  Status:  Discontinued     1,000 mg 200 mL/hr over 60 Minutes Intravenous  Once 09/10/16 0110 09/10/16 0118   09/04/2016 2200  metroNIDAZOLE (FLAGYL) IVPB 500 mg  Status:  Discontinued     500 mg 100 mL/hr over 60 Minutes Intravenous Every 6 hours 09/20/2016 2110 09/11/16 0902   09/20/2016 2115  vancomycin (VANCOCIN) IVPB 1000 mg/200 mL premix     1,000 mg 200 mL/hr over 60 Minutes Intravenous  Once 09/21/2016 2108 09/15/2016 2323   09/16/2016 1430  levofloxacin (LEVAQUIN) IVPB 750 mg     750 mg 100 mL/hr over 90 Minutes Intravenous  Once 09/29/2016 1415 09/14/2016 2152    10/01/2016 1430  metroNIDAZOLE (FLAGYL) IVPB 500 mg     500 mg 100 mL/hr over 60 Minutes Intravenous  Once 09/05/2016 1415 09/29/2016 1850   09/27/2016 1430  levofloxacin (LEVAQUIN) IVPB 750 mg  Status:  Discontinued     750 mg 100 mL/hr over 90 Minutes Intravenous  Once 10/01/2016 1416 09/06/2016 1416     Subjective: Seen and examined at bedside this AM and was resting well and patient's husband states she slept through the night. No problems overnight. Patient awoke to voice and examination but was still very sleepy.   Objective: Vitals:   10/17/16 1235 10/17/16 1240 10/17/16 1307 10/17/16 1650  BP: (!) 148/79  (!) 148/78 (!) 148/84  Pulse:   97 (!) 111  Resp: 20 18    Temp:      TempSrc:      SpO2:      Weight:      Height:        Intake/Output Summary (Last 24 hours) at 10/17/16 1917 Last data filed at 10/17/16 0515  Gross per 24 hour  Intake              340 ml  Output                0 ml  Net              340 ml   Filed Weights   10/12/16 0400 10/13/16 0400 10/14/16 0400  Weight: 69.9 kg (154 lb) 69.4  kg (153 lb 1.6 oz) 69.9 kg (154 lb 3.2 oz)   Examination: Physical Exam:  Constitutional: Obese Caucasian female in NAD laying comfortably in bed on BiPAP Eyes: Lids Normal. Sclerae anicteric ENMT: Mucous membranes appear moist Neck: No visible cervical lymphadenopathy or JVD Respiratory: Diminished to Auscultation with mild crackles. No appreciable JVD Cardiovascular: Tachycardic Rate but regular rhythm Abdomen: Soft, NT, Distended due to body habitus. Bowel Sounds present GU: Deferred; Foley cath in place Musculoskeletal: No contractures. No cyanosis Skin: Diffuse ecchymosis on upper chest and legs Neurologic: No appreciable focal deficits. Was resting and awoke to voice commands Psychiatric: Still a little confused but resting. Impaired judgement and insight.   Data Reviewed: I have personally reviewed following labs and imaging studies  CBC:  Recent Labs Lab  10/14/16 0339 10/15/16 0301 10/15/16 1946 10/16/16 0448 10/17/16 0505  WBC 7.1 12.7* 12.4* 15.5* 14.3*  NEUTROABS  --   --  9.4* 12.8* 11.4*  HGB 10.5* 9.8* 8.1* 8.7* 8.0*  HCT 31.9* 31.1* 25.7* 27.6* 25.6*  MCV 97.6 97.2 98.1 98.2 98.8  PLT 136* 177 146* 169 291   Basic Metabolic Panel:  Recent Labs Lab 10/13/16 0337 10/14/16 0339 10/15/16 0301 10/15/16 1912 10/16/16 0448 10/17/16 0505  NA 136 140 141 142 139 142  K 4.5 3.9 2.7* 3.4* 3.7 3.1*  CL 109 113* 114* 114* 114* 113*  CO2 20* 22 20* 24 21* 24  GLUCOSE 91 104* 156* 100* 114* 97  BUN _0 CREATININE 0.72 0.81 0.84 0.87 0.98 0.96  CALCIUM 7.0* 6.9* 7.3* 7.1* 7.4* 7.3*  MG 1.6* 2.3  --  1.6* 1.9 1.9  PHOS  --   --   --  3.4 3.4 3.9   GFR: Estimated Creatinine Clearance: 58.4 mL/min (by C-G formula based on SCr of 0.96 mg/dL). Liver Function Tests:  Recent Labs Lab 10/14/16 0339 10/15/16 0301 10/15/16 1912 10/16/16 0448 10/17/16 0505  AST _1 ALT _2 ALKPHOS 305* 368* 345* 441* 511*  BILITOT 0.5 0.7 0.4 0.6 1.0  PROT 4.8* 5.5* 4.9* 4.9* 4.7*  ALBUMIN 1.3* 1.4* 1.3* 1.4* 1.3*   No results for input(s): LIPASE, AMYLASE in the last 168 hours. No results for input(s): AMMONIA in the last 168 hours. Coagulation Profile: No results for input(s): INR, PROTIME in the last 168 hours. Cardiac Enzymes:  Recent Labs Lab 10/15/16 0301 10/15/16 1912  TROPONINI 0.05* 0.07*   BNP (last 3 results) No results for input(s): PROBNP in the last 8760 hours. HbA1C: No results for input(s): HGBA1C in the last 72 hours. CBG:  Recent Labs Lab 10/15/16 2156 10/16/16 0616 10/16/16 1126 10/16/16 2115 10/17/16 0602  GLUCAP 103* 113* 105* 94 100*   Lipid Profile: No results for input(s): CHOL, HDL, LDLCALC, TRIG, CHOLHDL, LDLDIRECT in the last 72 hours. Thyroid Function Tests: No results for input(s): TSH, T4TOTAL, FREET4, T3FREE, THYROIDAB in the last 72 hours. Anemia  Panel: No results for input(s): VITAMINB12, FOLATE, FERRITIN, TIBC, IRON, RETICCTPCT in the last 72 hours. Sepsis Labs:  Recent Labs Lab 10/15/16 0301  LATICACIDVEN 1.2    Recent Results (from the past 240 hour(s))  Culture, blood (routine x 2)     Status: None (Preliminary result)   Collection Time: 10/13/16  6:58 PM  Result Value Ref Range Status   Specimen Description BLOOD LEFT HAND  Final   Special Requests IN PEDIATRIC BOTTLE Blood Culture adequate volume  Final  Culture NO GROWTH 4 DAYS  Final   Report Status PENDING  Incomplete  Culture, blood (routine x 2)     Status: None (Preliminary result)   Collection Time: 10/13/16  7:21 PM  Result Value Ref Range Status   Specimen Description BLOOD RIGHT HAND  Final   Special Requests IN PEDIATRIC BOTTLE Blood Culture adequate volume  Final   Culture NO GROWTH 4 DAYS  Final   Report Status PENDING  Incomplete   Radiology Studies: Dg Chest Port 1 View  Result Date: 10/17/2016 CLINICAL DATA:  Shortness of breath. EXAM: PORTABLE CHEST 1 VIEW COMPARISON:  Chest x-ray from yesterday. FINDINGS: Unchanged positioning of the right PICC line with the tip near the cavoatrial junction. Stable cardiomegaly. Unchanged thoracic aortic endograft. Mild pulmonary vascular congestion. Unchanged left basilar atelectasis and adjacent small left pleural effusion. No pneumothorax. IMPRESSION: 1. Stable left basilar atelectasis and small left pleural effusion. 2. Mild pulmonary vascular congestion without overt edema, improved. Electronically Signed   By: Titus Dubin M.D.   On: 10/17/2016 08:32   Dg Chest Port 1 View  Result Date: 10/16/2016 CLINICAL DATA:  61 year old female with shortness of breath EXAM: PORTABLE CHEST 1 VIEW COMPARISON:  Prior chest x-ray 10/15/2016 FINDINGS: Stable cardiomegaly. Thoracic aortic endograft remains in unchanged position compared to prior. A gastric tube is been removed. Persistent bilateral layering pleural effusions  slightly larger on the left than the right. Mild vascular congestion with interstitial pulmonary edema. Findings appear slightly improved compared yesterday. No pneumothorax. No acute osseous abnormality. IMPRESSION: 1. Slightly improved interstitial pulmonary edema. 2. Persistent bilateral layering pleural effusions slightly larger on the left than the right. 3. Unchanged position of thoracic aortic stent graft. 4. Stable cardiomegaly. Electronically Signed   By: Jacqulynn Cadet M.D.   On: 10/16/2016 17:51   Scheduled Meds: . ALPRAZolam  0.25 mg Oral QHS  . aspirin EC  81 mg Oral Daily  . dronabinol  5 mg Oral BID AC  . feeding supplement (ENSURE ENLIVE)  237 mL Oral TID BM  . insulin aspart  0-5 Units Subcutaneous QHS  . insulin aspart  0-9 Units Subcutaneous TID WC  . lisdexamfetamine  20 mg Oral Daily  . metoprolol tartrate  7.5 mg Intravenous Q6H  . scopolamine  1 patch Transdermal Q72H  . sodium chloride flush  10-40 mL Intracatheter Q12H  . sodium chloride flush  3 mL Intravenous Q12H   Continuous Infusions: . anidulafungin Stopped (10/17/16 1710)  . cefTRIAXone (ROCEPHIN)  IV Stopped (10/17/16 1551)  . famotidine (PEPCID) IV Stopped (10/17/16 1111)  . metronidazole 500 mg (10/17/16 1816)    LOS: 54 days   Kerney Elbe, DO [facility] Pager [phone]  If 7PM-7AM, please contact night-coverage [email or web address] Password [facility] 10/17/2016, 7:17 PM

## 2016-10-18 ENCOUNTER — Inpatient Hospital Stay (HOSPITAL_COMMUNITY): Payer: BLUE CROSS/BLUE SHIELD

## 2016-10-18 LAB — COMPREHENSIVE METABOLIC PANEL
ALBUMIN: 1.5 g/dL — AB (ref 3.5–5.0)
ALT: 15 U/L (ref 14–54)
ANION GAP: 4 — AB (ref 5–15)
AST: 17 U/L (ref 15–41)
Alkaline Phosphatase: 541 U/L — ABNORMAL HIGH (ref 38–126)
BUN: 11 mg/dL (ref 6–20)
CHLORIDE: 112 mmol/L — AB (ref 101–111)
CO2: 26 mmol/L (ref 22–32)
Calcium: 7.5 mg/dL — ABNORMAL LOW (ref 8.9–10.3)
Creatinine, Ser: 0.91 mg/dL (ref 0.44–1.00)
GFR calc Af Amer: >60 mL/min (ref >60)
GLUCOSE: 112 mg/dL — AB (ref 65–99)
POTASSIUM: 3.4 mmol/L — AB (ref 3.5–5.1)
Sodium: 142 mmol/L (ref 135–145)
Total Bilirubin: 0.7 mg/dL (ref 0.3–1.2)
Total Protein: 5.1 g/dL — ABNORMAL LOW (ref 6.5–8.1)

## 2016-10-18 LAB — CULTURE, BLOOD (ROUTINE X 2)
Culture: NO GROWTH
Culture: NO GROWTH
Special Requests: ADEQUATE
Special Requests: ADEQUATE

## 2016-10-18 LAB — CBC
HEMATOCRIT: 27.9 % — AB (ref 36.0–46.0)
HEMOGLOBIN: 8.8 g/dL — AB (ref 12.0–15.0)
MCH: 31.2 pg (ref 26.0–34.0)
MCHC: 31.5 g/dL (ref 30.0–36.0)
MCV: 98.9 fL (ref 78.0–100.0)
Platelets: 206 10*3/uL (ref 150–400)
RBC: 2.82 MIL/uL — ABNORMAL LOW (ref 3.87–5.11)
RDW: 19.7 % — AB (ref 11.5–15.5)
WBC: 13.3 10*3/uL — AB (ref 4.0–10.5)

## 2016-10-18 LAB — GLUCOSE, CAPILLARY
GLUCOSE-CAPILLARY: 88 mg/dL (ref 65–99)
GLUCOSE-CAPILLARY: 112 mg/dL — AB (ref 65–99)
GLUCOSE-CAPILLARY: 125 mg/dL — AB (ref 65–99)
Glucose-Capillary: 153 mg/dL — ABNORMAL HIGH (ref 65–99)
Glucose-Capillary: 157 mg/dL — ABNORMAL HIGH (ref 65–99)

## 2016-10-18 LAB — MAGNESIUM: MAGNESIUM: 1.5 mg/dL — AB (ref 1.7–2.4)

## 2016-10-18 MED ORDER — MAGNESIUM SULFATE 2 GM/50ML IV SOLN
2.0000 g | Freq: Once | INTRAVENOUS | Status: AC
  Administered 2016-10-18: 2 g via INTRAVENOUS
  Filled 2016-10-18: qty 50

## 2016-10-18 MED ORDER — FUROSEMIDE 10 MG/ML IJ SOLN
40.0000 mg | Freq: Once | INTRAMUSCULAR | Status: AC
  Administered 2016-10-18: 40 mg via INTRAVENOUS
  Filled 2016-10-18: qty 4

## 2016-10-18 MED ORDER — POTASSIUM CHLORIDE 20 MEQ/15ML (10%) PO SOLN
40.0000 meq | Freq: Once | ORAL | Status: AC
  Administered 2016-10-18: 40 meq via ORAL
  Filled 2016-10-18: qty 30

## 2016-10-18 NOTE — Progress Notes (Signed)
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 She presented with weakness and met sepsis criteria initially with concern for enteritis.  She grew a yeast UTI and on 8/18 1/2 blood cultures grew yeast and the patient was started on eraxis.  She was switched to fluconazole and antibiotics were d/c'd on 8/20 with blood cultures only growing candida.  ID recommended continuing fluconazole 400 mg daily through 9/26 and recommended ophtho exam within the next month.     She had an echo on 8/28 which showed new systolic dysfunction and her QT was markedly prolonged so she was changed back from fluconazole to Eraxis until 9/26.  Cardiology was consulted and she was diuresed.   On 9/10 she developed new nausea and vomiting.  KUB showed concern for a SBO.  CT confirmed this and also showed evidence of possible enteritis.  Repeat blood cultures obtained and started on ceftriaxone/flagyl.   Given severe malnutrition will need to consider TPN for Haley Graham and won't be able to place PICC until blood Cx are Negative x 3 days per ID reccs.   On 9/12 ECHO was repeated and was essentially the same; Deferring to Cardiology for further invasive procedures such as angiography and likely to be done early next if stable  Patient had Rapid Response on the night of 10/15/16 and ended up on BiPAP and being diuresed. She improved but continued to be delirious. This AM NGT was removed by Surgery and patient was placed on FULL Liquid  Diet. TPN was ordered for Patient but held due to evaluating if she could tolerate a FULL Diet and possibly TF through [place].  Another Rapid Response on 10/16/16 was called on the patient as patient was increasingly Tachypenic on BiPAP. I went to assess the patient and ordered a Stat CXR, ABG, and gave an additional 40 of IV Lasix. Had some flash pulmonary edema. Patient was maintaining saturations but still had a tenuous respiratory status so PCCM was called to evaluate and after evaluation patient improved and will remain in SDU with low threshold to transfer to ICU if she deteriorates. PCCM evaluated today and because patient was doing better they signed off the case.   Yesterday AM patient was resting well, and per husband was the best she has rested in a while. Discussed plan of care with Surgery and were to place a Cortrak for TF however husband did not want to have it placed today and will hold off and re-assess Monday for TF.   This morning patient was still slightly confused and was given another dose of IV Lasix. Possible Cortrak vs. TPN in AM if still is not eating significantly. Surgery is repeating Abdominal X-Ray in AM. Plan is for High Risk Diagnostic and Possible Interventional Cardiac Cath by Dr. Burt Knack on Wednesday  Assessment & Plan:   Principal Problem:   Candidemia ([place]) Active Problems:   GERD (gastroesophageal reflux disease)   Dissection of thoracoabdominal aorta (HCC)   AAA (abdominal aortic aneurysm) (HCC)   Benign essential HTN   Diabetes mellitus type 2 in obese [facility])   Anxiety state  Hyponatremia   Leukocytosis   Sepsis (HCC)   Hypotension   AKI (acute kidney injury) ([place])   Dehydration   Generalized weakness   Anemia   Thrombocytopenia (HCC)   Abnormal computed tomography angiography (CTA) of abdomen and pelvis   Colitis: Probable   Protein-calorie malnutrition, severe   Palliative care by specialist   Adult failure to thrive   Hypophosphatemia    Hypomagnesemia   Depression   Acute systolic (congestive) heart failure (HCC)   DNR (do not resuscitate) discussion   Congestive heart failure (HCC)   Heme positive stool   Cardiac LV ejection fraction 30-35%   QT prolongation   Acute hypercapnic respiratory failure (HCC)   Metabolic encephalopathy   Pulmonary edema cardiac cause (HCC)   Acute respiratory distress   Encephalopathy acute  Acute Hypercapnic Respiratory Failure Requiring NIPPV -Decompensated overnight and ended up on BiPAP -Given Aggressive Diuresis with IV Lasix; Given IV Lasix 40 this AM -PCCM Evaluated and Improved   -ABG showed improvement -C/w NIPPV with BiPAP qHS and prn and c/w [place] -Maintain Saturations well on [place] -PCCM Signed off  Small Bowel Obstruction  Enteritis?, improving - Surgery followin, SB, protocol, KUB showed no convincing radiographic improvement in apparent mid/distal small bowel obstruction. -C/w ceftriaxone/flagyl for evidence of ? enteritis, although with her history, may be related to mesenteric ischemia. repeat blood cx as well due to tachy, but normal WBC, without fevers. Low Threshold to de-escalate  - D5 NS at 75 mL/hr ordered x 3 days stopped as patient went into volume overload and flash pulmonary edema -NGT Removed -Diet Advanced to FULL's -Patient ordered TPN but will Hold off given tenuous Respiratory Status and want to see if she tolerates FULLS and possibly another Cortrak with TF; -Discussed case with Surgery PA who thought Cortrak was a good idea but patient tolerated FULLS and husband wants to re-assess Cortrak on Monday.  -If patient is not eating significantly will need Cortrak for TF or TPN in AM. Will need to re-evaluate  Sepsis due to Candidemia, improved  -Sepsis Physiology improved  - discussed with ID, ID to see, switched to eraxis (caspofungin not on formulary) after discussion with ID -> continue until 9/26  -Case was discussed with Dr. Valetta Close 9/7 who recommended  follow up after discharge for ophthalmologic exam -Some concern for thrush, ctm on eraxis, she's been on fluconazole  -Per ID continue Anidulafungin for 6 weeks; -Blood Cx x 2 on 10/13/16 showed NGTD at 5 days -PICC placed 10/15/16 at the recommendation of ID; Was going to start TPN but will hold off for now   Splenic Infarcts:   -Unclear cause, new since previous study per rads.   -Possibly related to thrombosis or dissection within aorta, though thrombosis is infrarenal and celiac comes off true lumen.    Acute systolic congestive heart failure:  - Holding lasix 20 mg PO daily, with tachycardia/N/V with above SBO -Repeat echo with EF 35-40% and grade 2 diastolic dysfunction as well as hypokinesis of mid-apicalanteroseptal, anterior, and inferolateral myocardium (mildly improved from 8/28 echo at 30-35%) -Follow up cardiology recs Cardiology rec f/u in clinic in 4-6 weeks with repeat echo -> Cards repeated ECHO 10/15/11 which was essentially unchanged -May needLexiscan Myoview versus cath but will defer to Cardiology.Denied shortness of breath or chest pain today. Continue to monitor urine output and electrolytes. Cardiology consult appreciated. -C/w Diuresis per Cards;  -IV Furosemide 40 mg given this AM -On beta blocker, consider ace/arb in future -Cardiology would  potentially consider invasive angiography and likely compelled to proceed with Coronary Angiography to clarify diagnosis as it is less likely stress cardiomyopathy and likely LAD stenosis and ischemia. -Plan is for patient to undergo high risk diagnositc and possible interventional cardiac catheterization on Tuesday with Dr. Burt Knack -Bare Metal Stent may be preferred if intervened to shorten duration of DAPT  Prolonged QTC: Over 600 initially and improved.   -D/c fluconazole.  D/c tramadol and hold cymbalta.  Zofran discontinued.  -Discontinue Azithromycin and abilify.  Monitor in telemetry. Repeat EKG.  Tachycardia:    -Continue to Monitor on Telemetry  Severe protein calorie malnutrition:  -Nutritionist Consulted -Cortrak was dc'd.  Will need to consider TPN again but now that NGT is removed will hold off and see if patient will tolerate FULLs and another Cortrak to allow for TF.   -PICC Placed incase TPN to be started again -Cortrak was to be placed yesterday but patient tolerated her diet and Husband wants to hold off and re-evaluate Monday. If patient is not tolerating much or eating enough will need Cortrak for TF or TPN. Will need to re-evaluate in AM    Acute kidney Injury  -Improved; BUN/Cr is stable at 11/0.91 -Continue to Monitor CMP's  Acute on Chronic anemia likely due to chronic disease vs acute GI bleed:  -Iron stores acceptable. Fecal occult blood test positive. On Protonix. Status post 2 unit of red blood cell transfusion on 8/30 with improvement in hemoglobin. Evaluated by GI. No further endoscopy at this time. -Hemoglobin level stable at 8.8/27.9 -Monitor CBC  Thrombocytopenia likely due to acute illness:  -Monitor CBC. Platelet count up and down over past few days.  -Continue to Monitor; Platelet Count was 136 and improved to 206  Depression and Anxiety:  -Evaluated by psychiatrist.   -Continue Xanax, Holding cymbalta, andVyvanse per psychiatrist  Hypomagnesemia -Mag was 1.5 -Replete with IV Mag Sulfate -Repeat Mag Level in AM  Loose stools:  -Negative C. Diff; Possibly from SBO -Had a Normal BM overnight   Acute Delirium, improved slightly -C/w Delirium precautions -Continue to Redirect  -Continue to try to treat underlying causes -Resting comfortably and is alert but still a little confused  Hypokalemia -Patient's K+ was 3.4 -Replete with po KCl 40 mEQ -Continue to Monitor  Goals of care:  -Palliative follow up with patient/family yesteday. Had a discussion with Husband about Haley Graham.  Discussed that she was extremely sick and that it is hard to  predict when these new complications would resolve (both asked about when she might be able to get to the SNF). -Discussed need for TPN again as well and will consider starting in AM.    Leukocytosis, improved slightly -On Abx -WBC went from 12.4 -> 15.5 -> 14.3 -> 13.3 -?Reactive -Recent Blood Cx show NGTD at 5 days -May need to Pan-Culture Again and broaden Abx as she is on Ceftriaxone and Flagyl -Continue to Monitor and Repeat CBC in AM   DVT prophylaxis: SCDs Code Status: FULL CODE Family Communication: Discussed with Husband  Disposition Plan: SNF when medically Stable  Consultants:   Cardiology  Psychiatry  Infectious Diseases  Palliative Care Medicine  Gastroenterology  General Surgery  Nutrition  Discussed Case with Vascular   PCCM   Procedures:   Thoracentesis 8/17  Echo 8/28 EF 30-35%, akinesis of mid-apicalanteroseptal and anterior myocardium. Diastolic dysfunction.  Echo 8/13 EF 23-55, diastolic dysfunction, lipomatous hypertrophy  Echo 9/4 EF 35-40%, hypokinesis of mid apicalanteroseptal, anterior, and inferolateral myocardium, grade  2 diastolic dysfunction -Echo 9/12 There was mild concentric hypertrophy. Systolic function was moderately reduced. The estimated ejection fraction was in the range of 35% to 40%. There is akinesis of the mid-apicalanteroseptal, anterior,   anterolateral, lateral, and apical myocardium. The study is not technically sufficient to allow evaluation of LV diastolic   Antimicrobials:  Anti-infectives    Start     Dose/Rate Route Frequency Ordered Stop   10/16/16 1400  cefTRIAXone (ROCEPHIN) 2 g in dextrose 5 % 50 mL IVPB     2 g 100 mL/hr over 30 Minutes Intravenous Every 24 hours 10/16/16 0810     10/14/16 1500  anidulafungin (ERAXIS) 100 mg in sodium chloride 0.9 % 100 mL IVPB     100 mg 78 mL/hr over 100 Minutes Intravenous Every 24 hours 10/13/16 1018 11/25/16 1459   10/13/16 1100  cefTRIAXone (ROCEPHIN) 2 g in  dextrose 5 % 50 mL IVPB - Premix  Status:  Discontinued     2 g 100 mL/hr over 30 Minutes Intravenous Every 24 hours 10/13/16 1016 10/16/16 0810   10/13/16 1100  metroNIDAZOLE (FLAGYL) IVPB 500 mg     500 mg 100 mL/hr over 60 Minutes Intravenous Every 8 hours 10/13/16 1016     10/13/16 1030  anidulafungin (ERAXIS) 200 mg in sodium chloride 0.9 % 200 mL IVPB     200 mg 78 mL/hr over 200 Minutes Intravenous  Once 10/13/16 1018 10/13/16 2052   09/27/16 2200  erythromycin 250 mg in sodium chloride 0.9 % 100 mL IVPB  Status:  Discontinued     250 mg 100 mL/hr over 60 Minutes Intravenous Every 8 hours 09/27/16 1842 09/27/16 1959   09/27/16 2200  erythromycin (EES) 400 MG/5ML suspension 250 mg  Status:  Discontinued     250 mg Oral Every 8 hours 09/27/16 1959 09/29/16 1147   09/24/16 1300  fluconazole (DIFLUCAN) tablet 400 mg  Status:  Discontinued     400 mg Oral Daily 09/24/16 1138 10/13/16 0953   09/21/16 1400  fluconazole (DIFLUCAN) IVPB 400 mg  Status:  Discontinued     400 mg 100 mL/hr over 120 Minutes Intravenous Every 24 hours 09/20/16 1220 09/20/16 1221   09/21/16 0000  anidulafungin (ERAXIS) 50 mg in sodium chloride 0.9 % 50 mL IVPB  Status:  Discontinued     50 mg 78 mL/hr over 50 Minutes Intravenous Every 24 hours 09/19/16 0345 09/19/16 0346   09/20/16 1400  fluconazole (DIFLUCAN) IVPB 800 mg  Status:  Discontinued     800 mg 200 mL/hr over 120 Minutes Intravenous  Once 09/20/16 1220 09/20/16 1221   09/20/16 1400  fluconazole (DIFLUCAN) IVPB 400 mg  Status:  Discontinued     400 mg 100 mL/hr over 120 Minutes Intravenous Every 24 hours 09/20/16 1221 09/24/16 1137   09/19/16 2200  anidulafungin (ERAXIS) 100 mg in sodium chloride 0.9 % 100 mL IVPB  Status:  Discontinued     100 mg 78 mL/hr over 100 Minutes Intravenous Every 24 hours 09/19/16 0345 09/20/16 1220   09/19/16 0400  anidulafungin (ERAXIS) 200 mg in sodium chloride 0.9 % 200 mL IVPB     200 mg 78 mL/hr over 200 Minutes  Intravenous  Once 09/19/16 0345 09/19/16 0819   09/18/16 1800  fluconazole (DIFLUCAN) IVPB 100 mg  Status:  Discontinued     100 mg 50 mL/hr over 60 Minutes Intravenous Every 24 hours 09/18/16 1109 09/19/16 1937   09/17/16 1800  ceFEPIme (MAXIPIME) 2 g in  dextrose 5 % 50 mL IVPB  Status:  Discontinued     2 g 100 mL/hr over 30 Minutes Intravenous Every 24 hours 09/17/16 1243 09/21/16 1200   09/17/16 1800  fluconazole (DIFLUCAN) IVPB 200 mg  Status:  Discontinued     200 mg 100 mL/hr over 60 Minutes Intravenous Every 24 hours 09/17/16 1651 09/18/16 1109   09/12/16 1400  ceFEPIme (MAXIPIME) 1 g in dextrose 5 % 50 mL IVPB  Status:  Discontinued     1 g 100 mL/hr over 30 Minutes Intravenous Every 8 hours 09/12/16 1127 09/17/16 1243   09/11/16 1600  metroNIDAZOLE (FLAGYL) IVPB 500 mg  Status:  Discontinued     500 mg 100 mL/hr over 60 Minutes Intravenous Every 8 hours 09/11/16 0902 09/21/16 1200   09/11/16 1400  ceFEPIme (MAXIPIME) 1 g in dextrose 5 % 50 mL IVPB  Status:  Discontinued     1 g 100 mL/hr over 30 Minutes Intravenous Every 24 hours 09/11/16 1234 09/12/16 1127   09/10/16 2000  vancomycin (VANCOCIN) IVPB 1000 mg/200 mL premix  Status:  Discontinued     1,000 mg 200 mL/hr over 60 Minutes Intravenous Every 24 hours 09/19/2016 2130 09/10/16 1616   09/10/16 1800  vancomycin (VANCOCIN) IVPB 750 mg/150 ml premix  Status:  Discontinued     750 mg 150 mL/hr over 60 Minutes Intravenous Every 12 hours 09/10/16 1616 09/12/16 1145   09/10/16 1400  levofloxacin (LEVAQUIN) IVPB 500 mg  Status:  Discontinued     500 mg 100 mL/hr over 60 Minutes Intravenous Every 24 hours 09/18/2016 2112 09/11/16 1220   09/10/16 0115  vancomycin (VANCOCIN) IVPB 1000 mg/200 mL premix  Status:  Discontinued     1,000 mg 200 mL/hr over 60 Minutes Intravenous  Once 09/10/16 0110 09/10/16 0118   09/30/2016 2200  metroNIDAZOLE (FLAGYL) IVPB 500 mg  Status:  Discontinued     500 mg 100 mL/hr over 60 Minutes Intravenous  Every 6 hours 09/30/2016 2110 09/11/16 0902   09/06/2016 2115  vancomycin (VANCOCIN) IVPB 1000 mg/200 mL premix     1,000 mg 200 mL/hr over 60 Minutes Intravenous  Once 09/17/2016 2108 10/01/2016 2323   09/24/2016 1430  levofloxacin (LEVAQUIN) IVPB 750 mg     750 mg 100 mL/hr over 90 Minutes Intravenous  Once 09/28/2016 1415 09/12/2016 2152   09/12/2016 1430  metroNIDAZOLE (FLAGYL) IVPB 500 mg     500 mg 100 mL/hr over 60 Minutes Intravenous  Once 09/07/2016 1415 09/28/2016 1850   10/01/2016 1430  levofloxacin (LEVAQUIN) IVPB 750 mg  Status:  Discontinued     750 mg 100 mL/hr over 90 Minutes Intravenous  Once 09/11/2016 1416 09/29/2016 1416     Subjective: Seen and examined at bedside this AM and husband stated she did not have a good night. She was awake and alert but still confused and somewhat disoriented as she thought she was going home and coming back for the Cardiac Catheterization. She wanted to get clothes from home. No nausea or vomiting. No CP or lightheadedness.   Objective: Vitals:   10/18/16 0933 10/18/16 1100 10/18/16 1500 10/18/16 1615  BP:    (!) 136/54  Pulse:   89 97  Resp:      Temp:  98.1 F (36.7 C)    TempSrc:  Oral    SpO2: 100%     Weight:      Height:        Intake/Output Summary (Last 24 hours) at 10/18/16  [place] filed at 10/18/16 1027  Gross per 24 hour  Intake                3 ml  Output             1800 ml  Net            -1797 ml   Filed Weights   10/12/16 0400 10/13/16 0400 10/14/16 0400  Weight: 69.9 kg (154 lb) 69.4 kg (153 lb 1.6 oz) 69.9 kg (154 lb 3.2 oz)   Examination: Physical Exam:  Constitutional: Obese Caucasian female in NAD laying in bed. Is awake and alert but still somewhat confused and disoriented.  Eyes: Lids normal. Sclerae anicteric ENMT: Mucous membranes appear moist. Grossly normal hearing Neck: No visible cervical lymphadenopathy. No Appreciable JVD Respiratory: Diminished to auscultation with mild crackles; No appreciable  JVD Cardiovascular: Slightly tachycardic rate but regular rhythm. No appreciable LE edema Abdomen: Soft, NT, ND. Bowel sounds present GU: Deferred. Foley Cath in place Musculoskeletal: No contractures; No cyanosis Skin: Has diffuse ecchymosis on the upper chest and legs. Warm and dry Neurologic: CN 2-12 grossly intact. No appreciable focal deficits Psychiatric: Not agitated and was pleasant. Impaired judgement and insight. Awake and Alert but not fully oriented.   Data Reviewed: I have personally reviewed following labs and imaging studies  CBC:  Recent Labs Lab 10/15/16 0301 10/15/16 1946 10/16/16 0448 10/17/16 0505 10/18/16 0403  WBC 12.7* 12.4* 15.5* 14.3* 13.3*  NEUTROABS  --  9.4* 12.8* 11.4*  --   HGB 9.8* 8.1* 8.7* 8.0* 8.8*  HCT 31.1* 25.7* 27.6* 25.6* 27.9*  MCV 97.2 98.1 98.2 98.8 98.9  PLT 177 146* 169 170 409   Basic Metabolic Panel:  Recent Labs Lab 10/14/16 0339 10/15/16 0301 10/15/16 1912 10/16/16 0448 10/17/16 0505 10/18/16 0403  NA 140 141 142 139 142 142  K 3.9 2.7* 3.4* 3.7 3.1* 3.4*  CL 113* 114* 114* 114* 113* 112*  CO2 22 20* 24 21* 24 26  GLUCOSE 104* 156* 100* 114* 97 112*  BUN _0 CREATININE 0.81 0.84 0.87 0.98 0.96 0.91  CALCIUM 6.9* 7.3* 7.1* 7.4* 7.3* 7.5*  MG 2.3  --  1.6* 1.9 1.9 1.5*  PHOS  --   --  3.4 3.4 3.9  --    GFR: Estimated Creatinine Clearance: 61.6 mL/min (by C-G formula based on SCr of 0.91 mg/dL). Liver Function Tests:  Recent Labs Lab 10/15/16 0301 10/15/16 1912 10/16/16 0448 10/17/16 0505 10/18/16 0403  AST _1 ALT _2 ALKPHOS 368* 345* 441* 511* 541*  BILITOT 0.7 0.4 0.6 1.0 0.7  PROT 5.5* 4.9* 4.9* 4.7* 5.1*  ALBUMIN 1.4* 1.3* 1.4* 1.3* 1.5*   No results for input(s): LIPASE, AMYLASE in the last 168 hours. No results for input(s): AMMONIA in the last 168 hours. Coagulation Profile: No results for input(s): INR, PROTIME in the last 168 hours. Cardiac  Enzymes:  Recent Labs Lab 10/15/16 0301 10/15/16 1912  TROPONINI 0.05* 0.07*   BNP (last 3 results) No results for input(s): PROBNP in the last 8760 hours. HbA1C: No results for input(s): HGBA1C in the last 72 hours. CBG:  Recent Labs Lab 10/16/16 1126 10/16/16 2115 10/17/16 0602 10/18/16 1140 10/18/16 1614  GLUCAP 105* 94 100* 157* 112*   Lipid Profile: No results for input(s): CHOL, HDL, LDLCALC, TRIG, CHOLHDL, LDLDIRECT in the last 72 hours. Thyroid Function Tests:  No results for input(s): TSH, T4TOTAL, FREET4, T3FREE, THYROIDAB in the last 72 hours. Anemia Panel: No results for input(s): VITAMINB12, FOLATE, FERRITIN, TIBC, IRON, RETICCTPCT in the last 72 hours. Sepsis Labs:  Recent Labs Lab 10/15/16 0301  LATICACIDVEN 1.2    Recent Results (from the past 240 hour(s))  Culture, blood (routine x 2)     Status: None   Collection Time: 10/13/16  6:58 PM  Result Value Ref Range Status   Specimen Description BLOOD LEFT HAND  Final   Special Requests IN PEDIATRIC BOTTLE Blood Culture adequate volume  Final   Culture NO GROWTH 5 DAYS  Final   Report Status 10/18/2016 FINAL  Final  Culture, blood (routine x 2)     Status: None   Collection Time: 10/13/16  7:21 PM  Result Value Ref Range Status   Specimen Description BLOOD RIGHT HAND  Final   Special Requests IN PEDIATRIC BOTTLE Blood Culture adequate volume  Final   Culture NO GROWTH 5 DAYS  Final   Report Status 10/18/2016 FINAL  Final   Radiology Studies: Dg Chest Port 1 View  Result Date: 10/17/2016 CLINICAL DATA:  Shortness of breath. EXAM: PORTABLE CHEST 1 VIEW COMPARISON:  Chest x-ray from yesterday. FINDINGS: Unchanged positioning of the right PICC line with the tip near the cavoatrial junction. Stable cardiomegaly. Unchanged thoracic aortic endograft. Mild pulmonary vascular congestion. Unchanged left basilar atelectasis and adjacent small left pleural effusion. No pneumothorax. IMPRESSION: 1. Stable left  basilar atelectasis and small left pleural effusion. 2. Mild pulmonary vascular congestion without overt edema, improved. Electronically Signed   By: Titus Dubin M.D.   On: 10/17/2016 08:32   Dg Abd Portable 1v  Result Date: 10/18/2016 CLINICAL DATA:  Small bowel obstruction EXAM: PORTABLE ABDOMEN - 1 VIEW COMPARISON:  10/14/2016 FINDINGS: Descending thoracic aortic stent. Catheter is present with tip near the cavoatrial junction. Suspected retrocardiac airspace opacity. The nasogastric tube has been removed. A loop of bowel extending transversely across the mid Adam may represent moderately dilated small-bowel (up to 5.1 cm) or possibly gas within the transverse colon. I favor the former. Other loops of small bowel did not appear dilated. IMPRESSION: 1. Suspected moderately dilated loop of small bowel in the upper abdomen. Other loops of small bowel did not appear dilated. The appearance is abnormal but nonspecific and could be from local ileus or early obstruction. 2. Retrocardiac opacity, cannot exclude left lower lobe airspace opacity. Electronically Signed   By: Van Clines M.D.   On: 10/18/2016 13:05   Scheduled Meds: . ALPRAZolam  0.25 mg Oral QHS  . aspirin EC  81 mg Oral Daily  . dronabinol  5 mg Oral BID AC  . feeding supplement (ENSURE ENLIVE)  237 mL Oral TID BM  . insulin aspart  0-5 Units Subcutaneous QHS  . insulin aspart  0-9 Units Subcutaneous TID WC  . lisdexamfetamine  20 mg Oral Daily  . metoprolol tartrate  7.5 mg Intravenous Q6H  . scopolamine  1 patch Transdermal Q72H  . sodium chloride flush  10-40 mL Intracatheter Q12H  . sodium chloride flush  3 mL Intravenous Q12H   Continuous Infusions: . anidulafungin Stopped (10/18/16 1658)  . cefTRIAXone (ROCEPHIN)  IV Stopped (10/18/16 1547)  . famotidine (PEPCID) IV Stopped (10/18/16 1056)  . metronidazole Stopped (10/18/16 1126)    LOS: 69 days   Kerney Elbe, DO [facility] Pager  [phone]  If 7PM-7AM, please contact night-coverage [email or web address] Password [facility] [place] 10/18/2016, 6:41 PM

## 2016-10-18 NOTE — Progress Notes (Signed)
Patient ID: Haley Graham, female   DOB: 1955-05-08, 61 y.o.   MRN: [id number]  [facility] Surgery Progress Note     Subjective: CC- SBO Patient denies any current abdominal pain. No recent n/v. Not taking in much by mouth, but she is tolerating what she is eating. Had 1.5 Ensure yesterday and few bites of peaches and ice cream.1 good/loose BM yesterday.  Objective: Vital signs in last 24 hours: Temp:  [97.2 F (36.2 C)-99.1 F (37.3 C)] 97.2 F (36.2 C) (09/16 0540) Pulse Rate:  [97-116] 101 (09/16 0826) Resp:  [17-22] 20 (09/16 0932) BP: (117-157)/(70-88) 157/70 (09/15 2350) SpO2:  [100 %] 100 % (09/16 0933) Last BM Date: 10/16/16  Intake/Output from previous day: 09/15 0701 - 09/16 0700 In: -  Out: 1800 [Urine:1800] Intake/Output this shift: No intake/output data recorded.  PE: Gen: Alert, NAD, pleasant HEENT: EOM's intact, pupils equal and round Card: tachy Pulm: CTAB, no W/R/R, effort normal Abd: Soft, nondistended, +BS, no HSM, no hernia, nontender Psych: A&Ox3  Skin: no rashes noted, warm and dry  Lab Results:   Recent Labs  10/17/16 0505 10/18/16 0403  WBC 14.3* 13.3*  HGB 8.0* 8.8*  HCT 25.6* 27.9*  PLT 170 206   BMET  Recent Labs  10/17/16 0505 10/18/16 0403  NA 142 142  K 3.1* 3.4*  CL 113* 112*  CO2 24 26  GLUCOSE 97 112*  BUN 12 11  CREATININE 0.96 0.91  CALCIUM 7.3* 7.5*   PT/INR No results for input(s): LABPROT, INR in the last 72 hours. CMP     Component Value Date/Time   NA 142 10/18/2016 0403   K 3.4 (L) 10/18/2016 0403   CL 112 (H) 10/18/2016 0403   CO2 26 10/18/2016 0403   GLUCOSE 112 (H) 10/18/2016 0403   BUN 11 10/18/2016 0403   CREATININE 0.91 10/18/2016 0403   CALCIUM 7.5 (L) 10/18/2016 0403   PROT 5.1 (L) 10/18/2016 0403   ALBUMIN 1.5 (L) 10/18/2016 0403   AST 17 10/18/2016 0403   ALT 15 10/18/2016 0403   ALKPHOS 541 (H) 10/18/2016 0403   BILITOT 0.7 10/18/2016 0403   GFRNONAA >60 10/18/2016 0403   GFRAA >60 10/18/2016 0403   Lipase     Component Value Date/Time   LIPASE 14 09/29/2016 1139       Studies/Results: Dg Chest Port 1 View  Result Date: 10/17/2016 CLINICAL DATA:  Shortness of breath. EXAM: PORTABLE CHEST 1 VIEW COMPARISON:  Chest x-ray from yesterday. FINDINGS: Unchanged positioning of the right PICC line with the tip near the cavoatrial junction. Stable cardiomegaly. Unchanged thoracic aortic endograft. Mild pulmonary vascular congestion. Unchanged left basilar atelectasis and adjacent small left pleural effusion. No pneumothorax. IMPRESSION: 1. Stable left basilar atelectasis and small left pleural effusion. 2. Mild pulmonary vascular congestion without overt edema, improved. Electronically Signed   By: Titus Dubin M.D.   On: 10/17/2016 08:32   Dg Chest Port 1 View  Result Date: 10/16/2016 CLINICAL DATA:  61 year old female with shortness of breath EXAM: PORTABLE CHEST 1 VIEW COMPARISON:  Prior chest x-ray 10/15/2016 FINDINGS: Stable cardiomegaly. Thoracic aortic endograft remains in unchanged position compared to prior. A gastric tube is been removed. Persistent bilateral layering pleural effusions slightly larger on the left than the right. Mild vascular congestion with interstitial pulmonary edema. Findings appear slightly improved compared yesterday. No pneumothorax. No acute osseous abnormality. IMPRESSION: 1. Slightly improved interstitial pulmonary edema. 2. Persistent bilateral layering pleural effusions slightly larger on the left than  the right. 3. Unchanged position of thoracic aortic stent graft. 4. Stable cardiomegaly. Electronically Signed   By: Jacqulynn Cadet M.D.   On: 10/16/2016 17:51    Anti-infectives: Anti-infectives    Start     Dose/Rate Route Frequency Ordered Stop   10/16/16 1400  cefTRIAXone (ROCEPHIN) 2 g in dextrose 5 % 50 mL IVPB     2 g 100 mL/hr over 30 Minutes Intravenous Every 24 hours 10/16/16 0810     10/14/16 1500  anidulafungin  (ERAXIS) 100 mg in sodium chloride 0.9 % 100 mL IVPB     100 mg 78 mL/hr over 100 Minutes Intravenous Every 24 hours 10/13/16 1018 11/25/16 1459   10/13/16 1100  cefTRIAXone (ROCEPHIN) 2 g in dextrose 5 % 50 mL IVPB - Premix  Status:  Discontinued     2 g 100 mL/hr over 30 Minutes Intravenous Every 24 hours 10/13/16 1016 10/16/16 0810   10/13/16 1100  metroNIDAZOLE (FLAGYL) IVPB 500 mg     500 mg 100 mL/hr over 60 Minutes Intravenous Every 8 hours 10/13/16 1016     10/13/16 1030  anidulafungin (ERAXIS) 200 mg in sodium chloride 0.9 % 200 mL IVPB     200 mg 78 mL/hr over 200 Minutes Intravenous  Once 10/13/16 1018 10/13/16 2052   09/27/16 2200  erythromycin 250 mg in sodium chloride 0.9 % 100 mL IVPB  Status:  Discontinued     250 mg 100 mL/hr over 60 Minutes Intravenous Every 8 hours 09/27/16 1842 09/27/16 1959   09/27/16 2200  erythromycin (EES) 400 MG/5ML suspension 250 mg  Status:  Discontinued     250 mg Oral Every 8 hours 09/27/16 1959 09/29/16 1147   09/24/16 1300  fluconazole (DIFLUCAN) tablet 400 mg  Status:  Discontinued     400 mg Oral Daily 09/24/16 1138 10/13/16 0953   09/21/16 1400  fluconazole (DIFLUCAN) IVPB 400 mg  Status:  Discontinued     400 mg 100 mL/hr over 120 Minutes Intravenous Every 24 hours 09/20/16 1220 09/20/16 1221   09/21/16 0000  anidulafungin (ERAXIS) 50 mg in sodium chloride 0.9 % 50 mL IVPB  Status:  Discontinued     50 mg 78 mL/hr over 50 Minutes Intravenous Every 24 hours 09/19/16 0345 09/19/16 0346   09/20/16 1400  fluconazole (DIFLUCAN) IVPB 800 mg  Status:  Discontinued     800 mg 200 mL/hr over 120 Minutes Intravenous  Once 09/20/16 1220 09/20/16 1221   09/20/16 1400  fluconazole (DIFLUCAN) IVPB 400 mg  Status:  Discontinued     400 mg 100 mL/hr over 120 Minutes Intravenous Every 24 hours 09/20/16 1221 09/24/16 1137   09/19/16 2200  anidulafungin (ERAXIS) 100 mg in sodium chloride 0.9 % 100 mL IVPB  Status:  Discontinued     100 mg 78 mL/hr  over 100 Minutes Intravenous Every 24 hours 09/19/16 0345 09/20/16 1220   09/19/16 0400  anidulafungin (ERAXIS) 200 mg in sodium chloride 0.9 % 200 mL IVPB     200 mg 78 mL/hr over 200 Minutes Intravenous  Once 09/19/16 0345 09/19/16 0819   09/18/16 1800  fluconazole (DIFLUCAN) IVPB 100 mg  Status:  Discontinued     100 mg 50 mL/hr over 60 Minutes Intravenous Every 24 hours 09/18/16 1109 09/19/16 1937   09/17/16 1800  ceFEPIme (MAXIPIME) 2 g in dextrose 5 % 50 mL IVPB  Status:  Discontinued     2 g 100 mL/hr over 30 Minutes Intravenous Every 24 hours 09/17/16  1243 09/21/16 1200   09/17/16 1800  fluconazole (DIFLUCAN) IVPB 200 mg  Status:  Discontinued     200 mg 100 mL/hr over 60 Minutes Intravenous Every 24 hours 09/17/16 1651 09/18/16 1109   09/12/16 1400  ceFEPIme (MAXIPIME) 1 g in dextrose 5 % 50 mL IVPB  Status:  Discontinued     1 g 100 mL/hr over 30 Minutes Intravenous Every 8 hours 09/12/16 1127 09/17/16 1243   09/11/16 1600  metroNIDAZOLE (FLAGYL) IVPB 500 mg  Status:  Discontinued     500 mg 100 mL/hr over 60 Minutes Intravenous Every 8 hours 09/11/16 0902 09/21/16 1200   09/11/16 1400  ceFEPIme (MAXIPIME) 1 g in dextrose 5 % 50 mL IVPB  Status:  Discontinued     1 g 100 mL/hr over 30 Minutes Intravenous Every 24 hours 09/11/16 1234 09/12/16 1127   09/10/16 2000  vancomycin (VANCOCIN) IVPB 1000 mg/200 mL premix  Status:  Discontinued     1,000 mg 200 mL/hr over 60 Minutes Intravenous Every 24 hours 09/14/2016 2130 09/10/16 1616   09/10/16 1800  vancomycin (VANCOCIN) IVPB 750 mg/150 ml premix  Status:  Discontinued     750 mg 150 mL/hr over 60 Minutes Intravenous Every 12 hours 09/10/16 1616 09/12/16 1145   09/10/16 1400  levofloxacin (LEVAQUIN) IVPB 500 mg  Status:  Discontinued     500 mg 100 mL/hr over 60 Minutes Intravenous Every 24 hours 09/04/2016 2112 09/11/16 1220   09/10/16 0115  vancomycin (VANCOCIN) IVPB 1000 mg/200 mL premix  Status:  Discontinued     1,000 mg 200  mL/hr over 60 Minutes Intravenous  Once 09/10/16 0110 09/10/16 0118   09/08/2016 2200  metroNIDAZOLE (FLAGYL) IVPB 500 mg  Status:  Discontinued     500 mg 100 mL/hr over 60 Minutes Intravenous Every 6 hours 09/13/2016 2110 09/11/16 0902   09/12/2016 2115  vancomycin (VANCOCIN) IVPB 1000 mg/200 mL premix     1,000 mg 200 mL/hr over 60 Minutes Intravenous  Once 09/30/2016 2108 09/08/2016 2323   09/15/2016 1430  levofloxacin (LEVAQUIN) IVPB 750 mg     750 mg 100 mL/hr over 90 Minutes Intravenous  Once 09/18/2016 1415 09/16/2016 2152   09/19/2016 1430  metroNIDAZOLE (FLAGYL) IVPB 500 mg     500 mg 100 mL/hr over 60 Minutes Intravenous  Once 09/20/2016 1415 09/19/2016 1850   09/10/2016 1430  levofloxacin (LEVAQUIN) IVPB 750 mg  Status:  Discontinued     750 mg 100 mL/hr over 90 Minutes Intravenous  Once 09/14/2016 1416 09/02/2016 1416       Assessment/Plan Sepsis due to candidemia Splenic infarcts CHF Prolonged QTC Anemia Depression Respiratory failure/Acute pulmonary edema - improving CHF/cardiomyopathy - LAD ischemia. Considering cath next week if patient tolerating PO's as she would need to be on dual antiplatelet therapy if stent is placed. Severe protein calorie malnutrition - cleared for FLD but patient not taking in many PO's. Only had 600-700 calories yesterday (half of daily need). Husband wants to wait on Cortrak. If PO intake does not improve will consider TF tomorrow.  SBO - CT scan 9/11 showed early or partial small bowel obstruction with transition zone in the right lower quadrant - having bowel function, tolerating small amount of PO's  FEN - IVF,  FLD VTE - SCDs Foley - in place Follow up - TBD  Plan - Continue full liquids as tolerated. Strict calorie count. If PO intake not improving husband willing to consider Cortrak tomorrow. Encourage OOB. Hypomagnesemia and hypokalemia  being replaced. May still have a partial obstruction, will repeat abdominal XR.   LOS: 39 days    Wellington Hampshire , [facility] Surgery 10/18/2016, 9:56 AM Pager: [phone] Consults: [phone] Mon-Fri 7:00 am-4:30 pm Sat-Sun 7:00 am-11:30 am
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
## 2016-10-19 DIAGNOSIS — E876 Hypokalemia: Secondary | ICD-10-CM

## 2016-10-19 LAB — CBC WITH DIFFERENTIAL/PLATELET
BASOS ABS: 0 10*3/uL (ref 0.0–0.1)
Basophils Relative: 0 %
EOS PCT: 0 %
Eosinophils Absolute: 0 10*3/uL (ref 0.0–0.7)
HCT: 23.6 % — ABNORMAL LOW (ref 36.0–46.0)
HEMOGLOBIN: 7.7 g/dL — AB (ref 12.0–15.0)
LYMPHS ABS: 1.5 10*3/uL (ref 0.7–4.0)
LYMPHS PCT: 15 %
MCH: 32.2 pg (ref 26.0–34.0)
MCHC: 32.6 g/dL (ref 30.0–36.0)
MCV: 98.7 fL (ref 78.0–100.0)
Monocytes Absolute: 0.7 10*3/uL (ref 0.1–1.0)
Monocytes Relative: 6 %
NEUTROS PCT: 79 %
Neutro Abs: 8.2 10*3/uL — ABNORMAL HIGH (ref 1.7–7.7)
PLATELETS: 169 10*3/uL (ref 150–400)
RBC: 2.39 MIL/uL — AB (ref 3.87–5.11)
RDW: 19.7 % — ABNORMAL HIGH (ref 11.5–15.5)
WBC: 10.4 10*3/uL (ref 4.0–10.5)

## 2016-10-19 LAB — PHOSPHORUS: Phosphorus: 3 mg/dL (ref 2.5–4.6)

## 2016-10-19 LAB — COMPREHENSIVE METABOLIC PANEL
ALT: 11 U/L — ABNORMAL LOW (ref 14–54)
ANION GAP: 4 — AB (ref 5–15)
AST: 15 U/L (ref 15–41)
Albumin: 1.3 g/dL — ABNORMAL LOW (ref 3.5–5.0)
Alkaline Phosphatase: 436 U/L — ABNORMAL HIGH (ref 38–126)
BILIRUBIN TOTAL: 0.7 mg/dL (ref 0.3–1.2)
BUN: 8 mg/dL (ref 6–20)
CHLORIDE: 109 mmol/L (ref 101–111)
CO2: 28 mmol/L (ref 22–32)
Calcium: 7.1 mg/dL — ABNORMAL LOW (ref 8.9–10.3)
Creatinine, Ser: 0.77 mg/dL (ref 0.44–1.00)
Glucose, Bld: 98 mg/dL (ref 65–99)
POTASSIUM: 2.5 mmol/L — AB (ref 3.5–5.1)
Sodium: 141 mmol/L (ref 135–145)
TOTAL PROTEIN: 4.5 g/dL — AB (ref 6.5–8.1)

## 2016-10-19 LAB — CALCIUM, IONIZED: CALCIUM, IONIZED, SERUM: 4.7 mg/dL (ref 4.5–5.6)

## 2016-10-19 LAB — GLUCOSE, CAPILLARY
GLUCOSE-CAPILLARY: 130 mg/dL — AB (ref 65–99)
GLUCOSE-CAPILLARY: 138 mg/dL — AB (ref 65–99)
Glucose-Capillary: 156 mg/dL — ABNORMAL HIGH (ref 65–99)

## 2016-10-19 LAB — MAGNESIUM: Magnesium: 1.6 mg/dL — ABNORMAL LOW (ref 1.7–2.4)

## 2016-10-19 MED ORDER — POTASSIUM CHLORIDE 10 MEQ/100ML IV SOLN
10.0000 meq | INTRAVENOUS | Status: AC
  Administered 2016-10-19 (×2): 10 meq via INTRAVENOUS
  Filled 2016-10-19: qty 100

## 2016-10-19 MED ORDER — MAGNESIUM SULFATE 2 GM/50ML IV SOLN
2.0000 g | Freq: Once | INTRAVENOUS | Status: AC
Start: 2016-10-19 — End: 2016-10-19
  Administered 2016-10-19: 2 g via INTRAVENOUS
  Filled 2016-10-19: qty 50

## 2016-10-19 MED ORDER — POTASSIUM CHLORIDE 20 MEQ/15ML (10%) PO SOLN
40.0000 meq | Freq: Two times a day (BID) | ORAL | Status: DC
  Administered 2016-10-19: 40 meq via ORAL
  Filled 2016-10-19 (×4): qty 30

## 2016-10-19 MED ORDER — BOOST / RESOURCE BREEZE PO LIQD: 1.0000 | ORAL | Status: DC

## 2016-10-19 MED ORDER — TRACE MINERALS CR-CU-MN-SE-ZN 10-1000-500-60 MCG/ML IV SOLN
INTRAVENOUS | Status: AC
  Administered 2016-10-19: 18:00:00 via INTRAVENOUS
  Filled 2016-10-19: qty 720

## 2016-10-19 MED ORDER — POTASSIUM CHLORIDE 10 MEQ/50ML IV SOLN
10.0000 meq | INTRAVENOUS | Status: AC
  Administered 2016-10-19 (×3): 10 meq via INTRAVENOUS
  Filled 2016-10-19 (×3): qty 50

## 2016-10-19 MED ORDER — ONDANSETRON HCL 4 MG/2ML IJ SOLN
4.0000 mg | Freq: Four times a day (QID) | INTRAMUSCULAR | Status: DC | PRN
  Administered 2016-10-19 – 2016-10-23 (×6): 4 mg via INTRAVENOUS
  Filled 2016-10-19 (×6): qty 2

## 2016-10-19 MED ORDER — POTASSIUM CHLORIDE 20 MEQ/15ML (10%) PO SOLN
40.0000 meq | Freq: Once | ORAL | Status: AC
  Administered 2016-10-19: 40 meq via ORAL
  Filled 2016-10-19: qty 30

## 2016-10-19 MED ORDER — ENSURE ENLIVE PO LIQD
237.0000 mL | Freq: Two times a day (BID) | ORAL | Status: DC
  Administered 2016-10-20: 237 mL via ORAL

## 2016-10-19 MED ORDER — FAT EMULSION 20 % IV EMUL
250.0000 mL | INTRAVENOUS | Status: AC
  Administered 2016-10-19: 250 mL via INTRAVENOUS
  Filled 2016-10-19: qty 250

## 2016-10-19 NOTE — Progress Notes (Signed)
Nutrition Follow-up  DOCUMENTATION CODES:   Severe malnutrition in context of chronic illness  INTERVENTION:  - Will decrease Ensure Enlive from TID to BID, each supplement provides 350 kcal and 20 grams of protein. - Will trial Boost Breeze once/day, this supplement provides 250 kcal and 9 grams of protein - TPN initiation and advancement per Pharmacy. - Continue to encourage PO intakes of meals and supplements.  - RD will attempt to talk with pt at follow-up about the importance of adequate PO intake.   Monitor magnesium, potassium, and phosphorus daily for at least 3 days, MD to replete as needed, as pt is at risk for refeeding syndrome given very poor nutrition intake, severe malnutrition, current hypokalemia and hypomagnesemia.   NUTRITION DIAGNOSIS:   Malnutrition (severe) related to chronic illness (failure to thrive post repair aortic dissection) as evidenced by energy intake < or equal to 50% for > or equal to 1 month, percent weight loss. -ongoing  GOAL:   Patient will meet greater than or equal to 90% of their needs -unmet  MONITOR:   PO intake, Supplement acceptance, Weight trends, Labs, Other (Comment) (TPN regimen)  REASON FOR ASSESSMENT:   Consult New TPN/TNA  ASSESSMENT:    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 She presented with weakness and met sepsis criteria with concern for enteritis. Antibiotics started. Cultures pending.  9/17 See information outlined below concerning Cortrak tube. Consult for Cortrak replacement received today. This RD received a page from RN earlier confirming need for Cortrak. RD then received a consult for TPN from  Pharmacy and reviewed Pharmacy note for plan to initiate TPN today. Confirmed this plan, and pt's addiment refusal for Cortak placement, with RN. RN states that pt has a triple lumen PICC in place already. RN reports that breakfast tray was taken before she could see what and how much pt consumed. She knows that pt was drinking a glass of orange juice but unsure if pt finished juice or not.  This RD attempted to talk with pt. Pt did not allow RD to introduce self before stating "I need someone to move me to that bed now. I cannot wait, my rectum hurts because of this arthritis and I need someone right now." RD alerted RN and made pt aware that RN was on the way. Pt then stated "I cannot wait a minute, I need to move now. I am not allowed to get up because I might fall but if someone isn't in here now then you'll have to pick me up from the floor." Husband at bedside throughout and accepted meal tray from [facility].   Order for TPN in place: Cliniomix E 5/15 @ 30 mL/hr with 20% ILE @ 20 mL/hr x12 hours which will provide 36 grams of protein and 991 kcal.   Calorie Count: From 9/15 at 1548-9/16 at 1900 pt consumed 645 kcal and 15 grams of protein.   Medications reviewed; 5 mg Marinol BID, 20 mg IV Pepcid BID, sliding scale Novolog, 2 g IV Mg sulfate x1 run today, 10 mEq IV KCl x5 runs today, 40 mEq oral KCl BID. Labs reviewed; CBG: 156 mg/dL this AM, K: 2.5 mmol/L, Mg: 1.6 mg/dL.     9/15 - Patient had cortrak tube in past--was removed in favor  of larger bore NGT when patient developed SBO and required suction. - When TF was infusing, she could never tolerate >30 cc/hr of Vital 1.5.  - Diet was only advanced yesterday, however was on BIPAP.  - Today, husband reports the patient consumed an entire Ensure, an ice cream cup, 2 bites of peaches and half an container of ice chips.  - He says the patient had a good bowel movement today and has not been as nauseated with PO intake as usual.  - He  believes this is the best she has done in a while and would like to give patient more time to meet needs w/ oral diet.  - He really desires to wait until Monday (for Cortrak placement) as he is confident she is doing better than has in past.  - RD spoke with CCS MD on floor. He felt waiting until Monday for tube was reasonable.  - Will reorder Calorie Count.   9/12 - Pt developed abdominal pain, nausea and vomiting 9/10. - CT scan showed small bowel obstruction with thickened bowel wall. - TF (Vital 1.5) discontinued. Cortrak feeding tube removed. - Pt now NPO with NGT in place to LIS.   Diet Order:  Diet full liquid Room service appropriate? Yes; Fluid consistency: Thin TPN (CLINIMIX-E) Adult  Skin:  Reviewed, no issues (no pressure ulcers)  Last BM:  9/15  Height:   Ht Readings from Last 1 Encounters:  10/06/16 _0  (1.6 m)    Weight:   Wt Readings from Last 1 Encounters:  10/14/16 154 lb 3.2 oz (69.9 kg)    Ideal Body Weight:  52.2 kg  BMI:  Body mass index is 27.32 kg/m.  Estimated Nutritional Needs:   Kcal:  2000-2200  Protein:  110-120 gm  Fluid:  2.0-2.2 L  EDUCATION NEEDS:   Education needs no appropriate at this time    Jarome Matin, MS, RD, LDN, [place] [facility] Clinical Dietitian Pager # [phone] After hours/weekend pager # [phone]

## 2016-10-19 NOTE — Progress Notes (Signed)
PHARMACY - ADULT TOTAL PARENTERAL NUTRITION CONSULT NOTE   Pharmacy Consult for TPN Indication: malnutrition  Patient Measurements: Height: _0  (160 cm) Weight: 154 lb 3.2 oz (69.9 kg) IBW/kg (Calculated) : 52.4 TPN AdjBW (KG): 55.7 Body mass index is 27.32 kg/m.  Assessment: 7 yoF with PMH of type b aortic dissection, DM, gastroparesis, GERD, HTN, IBD who presented with enteritis.   Found to have fungal bacteremia with plans to be on fluconazole through 9/26  On 9/10 developed N/V and KUB showed concern for SBO; confirmed by CT  Patient is severely malnourished and family/patient is refusing cortrak placement for TF  GI: concern for ileus - no abd pain, LBM 9/15 Endo: CBGs < 150 on ssi Insulin requirements in the past 24 hours: 2 Lytes: K 2.5, mG 1.6 Renal: Scr 0.77 Pulm: had some flash pulm edema requiring bipap - now resolved and pulm signed off Cards: diagnostic cath planned 9/18 Hepatobil: alk phos 436, LFTs WNL Neuro: no issues ID: fungal bacteremia, intra-abd infection On eraxis, ceftriaxone, flagyl  TPN Access: PICC TPN start date: 9/17  Nutritional Goals (per RD recommendation on 9/12): KCal: 2000 - 2200 / day Protein: 110 - 120 gm / day  Current Nutrition: full liquid but patient not really consuming much, refusing TF  Plan:  Start Clinimix E 5/15 at 30 ml/hr Start 20% lipid emulsion at 20 ml/hr  This provides 36 g of protein and 991 kCals per day meeting 32% of protein and 50% of kCal needs Daily MVI and trace elements in TPN Continue SSI and adjust as needed Monitor TPN labs, placement of Cortrak F/U Labs in AM  Additional 3 runs of K  Levester Fresh, PharmD, BCPS, BCCCP Clinical Pharmacist Clinical phone for 10/19/2016 from 7a-3:30p: [phone] If after 3:30p, please call main pharmacy at: [phone] 10/19/2016 11:31 AM

## 2016-10-19 NOTE — Progress Notes (Signed)
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 She presented with weakness and met sepsis criteria initially with concern for enteritis.  She grew a yeast UTI and on 8/18 1/2 blood cultures grew yeast and the patient was started on eraxis.  She was switched to fluconazole and antibiotics were d/c'd on 8/20 with blood cultures only growing candida.  ID recommended continuing fluconazole 400 mg daily through 9/26 and recommended ophtho exam within the next month.     She had an echo on 8/28 which showed new systolic dysfunction and her QT was markedly prolonged so she was changed back from fluconazole to Eraxis until 9/26.  Cardiology was consulted and she was diuresed.   On 9/10 she developed new nausea and vomiting.  KUB showed concern for a SBO.  CT confirmed this and also showed evidence of possible enteritis.  Repeat blood cultures obtained and started on ceftriaxone/flagyl.   Given severe malnutrition will need to consider TPN for Haley Graham and won't be able to place PICC until blood Cx are Negative x 3 days per ID reccs.   On 9/12 ECHO was repeated and was essentially the same; Deferring to Cardiology for further invasive procedures such as angiography and likely to be done early next if stable  Patient had Rapid Response on the night of 10/15/16 and ended up on BiPAP and being diuresed. She improved but continued to be delirious. This AM NGT was removed by Surgery and patient was placed on FULL Liquid  Diet. TPN was ordered for Patient but held due to evaluating if she could tolerate a FULL Diet and possibly TF through [place].  Another Rapid Response on 10/16/16 was called on the patient as patient was increasingly Tachypenic on BiPAP. I went to assess the patient and ordered a Stat CXR, ABG, and gave an additional 40 of IV Lasix. Had some flash pulmonary edema. Patient was maintaining saturations but still had a tenuous respiratory status so PCCM was called to evaluate and after evaluation patient improved and will remain in SDU with low threshold to transfer to ICU if she deteriorates. PCCM evaluated today and because patient was doing better they signed off the case.   9/15  patient was resting well, and per husband was the best she has rested in a while. Discussed plan of care with Surgery and were to place a Cortrak for TF however husband did not want to have it placed today and will hold off and re-assess Monday for TF.   9/16  patient was still slightly confused and was given another dose of IV Lasix. Possible Cortrak vs. TPN in AM if still is not eating significantly. Surgery is repeating Abdominal X-Ray in AM. Plan is for High Risk Diagnostic and Possible Interventional Cardiac Cath by Dr. Burt Knack on Tuesday.   Patient refused Cortrak this AM and will proceed with TPN. Plan is still for Cardiac Cath in AM. Patient's K+ was severely low this AM so currently repeating. Updated Husband over the phone.   Assessment & Plan:   Principal Problem:   Candidemia ([place]) Active Problems:   GERD (gastroesophageal  reflux disease)   Dissection of thoracoabdominal aorta (HCC)   AAA (abdominal aortic aneurysm) (HCC)   Benign essential HTN   Diabetes mellitus type 2 in obese (HCC)   Anxiety state   Hyponatremia   Leukocytosis   Sepsis ([place])   Hypotension   AKI (acute kidney injury) ([place])   Dehydration   Generalized weakness   Anemia   Thrombocytopenia (HCC)   Abnormal computed tomography  angiography (CTA) of abdomen and pelvis   Colitis: Probable   Protein-calorie malnutrition, severe   Palliative care by specialist   Adult failure to thrive   Hypophosphatemia   Hypomagnesemia   Depression   Acute systolic (congestive) heart failure (HCC)   DNR (do not resuscitate) discussion   Congestive heart failure (HCC)   Heme positive stool   Cardiac LV ejection fraction 30-35%   QT prolongation   Acute hypercapnic respiratory failure (HCC)   Metabolic encephalopathy   Pulmonary edema cardiac cause (HCC)   Acute respiratory distress   Encephalopathy acute  Acute Hypercapnic Respiratory Failure Requiring NIPPV -Decompensated overnight and ended up on BiPAP -Given Aggressive Diuresis with IV Lasix; Given IV Lasix 40 this AM -PCCM Evaluated and Improved   -ABG showed improvement -C/w NIPPV with BiPAP qHS and prn and c/w [place] -Maintain Saturations well on  -PCCM Signed off  Small Bowel Obstruction  Enteritis?, improving - Surgery followin, SB, protocol, KUB showed no convincing radiographic improvement in apparent mid/distal small bowel obstruction. -C/w ceftriaxone/flagyl for evidence of ? enteritis, although with her history, may be related to mesenteric ischemia. repeat blood cx as well due to tachy, but normal WBC, without fevers. Low Threshold to de-escalate  - D5 NS at 75 mL/hr ordered x 3 days stopped as patient went into volume overload and flash pulmonary edema -NGT Removed -Diet Advanced to FULL's -Patient ordered TPN but will Hold off given tenuous Respiratory Status and want to see if she tolerates FULLS and possibly another Cortrak with TF; -Discussed case with Surgery PA who thought Cortrak was a good idea but patient tolerated FULLS and husband wants to re-assess Cortrak on Monday.  -If patient is not eating significantly will need Cortrak for TF or TPN -Discussed with Surgery again today and patient to get Cortrak for TF but patient adamantly refused so will  proceed with TPN  Sepsis due to Candidemia, improved  -Sepsis Physiology improved  - discussed with ID, ID to see, switched to eraxis (caspofungin not on formulary) after discussion with ID -> continue until 9/26  -Case was discussed with Dr. Valetta Close 9/7 who recommended follow up after discharge for ophthalmologic exam -Some concern for thrush, ctm on eraxis, she's been on fluconazole  -Per ID continue Anidulafungin for 6 weeks; -Blood Cx x 2 on 10/13/16 showed NGTD at 5 days -PICC placed 10/15/16 at the recommendation of ID; Will now start TPN  Splenic Infarcts:   -Unclear cause, new since previous study per rads.   -Possibly related to thrombosis or dissection within aorta, though thrombosis is infrarenal and celiac comes off true lumen.    Acute systolic congestive heart failure:  - Holding lasix 20 mg PO daily, with tachycardia/N/V with above SBO -Repeat echo with EF 35-40% and grade 2 diastolic dysfunction as well as hypokinesis of mid-apicalanteroseptal, anterior, and inferolateral myocardium (mildly improved from 8/28 echo at 30-35%) -Follow up cardiology recs Cardiology rec f/u in clinic in 4-6 weeks with repeat echo -> Cards repeated ECHO 10/15/11 which was essentially unchanged -May needLexiscan Myoview versus cath  but will defer to Cardiology.Denied shortness of breath or chest pain today. Continue to monitor urine output and electrolytes. Cardiology consult appreciated. -C/w Diuresis per Cards;  -IV Furosemide 40 mg given this AM -On beta blocker, consider ace/arb in future -Cardiology would potentially consider invasive angiography and likely compelled to proceed with Coronary Angiography to clarify diagnosis as it is less likely stress cardiomyopathy and likely LAD stenosis and ischemia. -Plan is for patient to undergo high risk diagnositc and possible interventional cardiac catheterization tomorrow with Dr. Burt Knack -Bare Metal Stent may be preferred if intervened to shorten  duration of DAPT  Prolonged QTC: Over 600 initially and improved.   -D/c fluconazole.  D/c tramadol and hold cymbalta.  Zofran discontinued.  -Discontinue Azithromycin and abilify.  Monitor in telemetry. Repeat EKG.  Tachycardia:  -Continue to Monitor on Telemetry  Severe protein calorie malnutrition:  -Nutritionist Consulted -Cortrak was dc'd.  Will need to consider TPN again but now that NGT is removed will hold off and see if patient will tolerate FULLs and another Cortrak to allow for TF.   -PICC Placed incase TPN to be started again -Cortrak was to be placed yesterday but patient tolerated her diet and Husband wants to hold off and re-evaluate Monday. Patient still had inadequate Nutrition so Cortrak was to be placed today to start TF but patient refused and does not want Cortrak -Will proceed with TPN;  -Nutritionist on board and Changing Ensure Enlive from TID to BID and starting Boost Breeze daily -Continue to Monitor Mag, Phos, and K+ Daily given risk of Refeeding Syndrome   Acute kidney Injury  -Improved; BUN/Cr is stable at 8/0.77 -Continue to Monitor CMP's  Acute on Chronic anemia likely due to chronic disease vs acute GI bleed:  -Iron stores acceptable. Fecal occult blood test positive. On Protonix. Status post 2 unit of red blood cell transfusion on 8/30 with improvement in hemoglobin. Evaluated by GI. No further endoscopy at this time.  -Hemoglobin/Hct dropped slightly from 8.8/27.9 -> 7.7/23.6 -Monitor CBC and if Drops further will transfuse pRBCs  Thrombocytopenia likely due to acute illness, improved  -Monitor CBC. Platelet count up and down over past few days.  -Continue to Monitor; Platelet Count now 169  Depression and Anxiety:  -Evaluated by psychiatrist.   -Continue Xanax, Holding cymbalta, andVyvanse per psychiatrist  Hypomagnesemia -Mag was 1.6 -Replete with IV Mag Sulfate -Repeat Mag Level in AM  Loose stools:  -Negative C. Diff; Possibly  from SBO -Had a Normal BM overnight   Acute Delirium, improving  -C/w Delirium precautions -Continue to Redirect as appropriate -Continue to try to treat underlying causes -Resting comfortably and is alert but still a little confused  Hypokalemia -Patient's K+ was 2.5 -Replete with IV KCl 50 mEQ and with po KCl BID -Continue to Monitor and repeat CMP in AM  Goals of care:  -Palliative following in the background and will return as needed.    Leukocytosis, improved  -On Abx -WBC went from 12.4 -> 15.5 -> 14.3 -> 13.3 -> 10.4 -?Reactive -Recent Blood Cx show NGTD at 5 days -May need to Pan-Culture Again and broaden Abx as she is on Ceftriaxone and Flagyl -Continue to Monitor and Repeat CBC in AM   DVT prophylaxis: SCDs Code Status: FULL CODE Family Communication: Discussed with Husband over the phone Disposition Plan: SNF when medically Stable  Consultants:   Cardiology  Psychiatry  Infectious Diseases  Palliative Care Medicine  Gastroenterology  General Surgery  Nutrition  Discussed Case with  Vascular   PCCM   Procedures:   Thoracentesis 8/17  Echo 8/28 EF 30-35%, akinesis of mid-apicalanteroseptal and anterior myocardium. Diastolic dysfunction.  Echo 8/13 EF 16-10, diastolic dysfunction, lipomatous hypertrophy  Echo 9/4 EF 35-40%, hypokinesis of mid apicalanteroseptal, anterior, and inferolateral myocardium, grade 2 diastolic dysfunction -Echo 9/12 There was mild concentric hypertrophy. Systolic function was moderately reduced. The estimated ejection fraction was in the range of 35% to 40%. There is akinesis of the mid-apicalanteroseptal, anterior,   anterolateral, lateral, and apical myocardium. The study is not technically sufficient to allow evaluation of LV diastolic   Antimicrobials:  Anti-infectives    Start     Dose/Rate Route Frequency Ordered Stop   10/16/16 1400  cefTRIAXone (ROCEPHIN) 2 g in dextrose 5 % 50 mL IVPB     2 g 100 mL/hr  over 30 Minutes Intravenous Every 24 hours 10/16/16 0810     10/14/16 1500  anidulafungin (ERAXIS) 100 mg in sodium chloride 0.9 % 100 mL IVPB     100 mg 78 mL/hr over 100 Minutes Intravenous Every 24 hours 10/13/16 1018 11/25/16 1459   10/13/16 1100  cefTRIAXone (ROCEPHIN) 2 g in dextrose 5 % 50 mL IVPB - Premix  Status:  Discontinued     2 g 100 mL/hr over 30 Minutes Intravenous Every 24 hours 10/13/16 1016 10/16/16 0810   10/13/16 1100  metroNIDAZOLE (FLAGYL) IVPB 500 mg     500 mg 100 mL/hr over 60 Minutes Intravenous Every 8 hours 10/13/16 1016     10/13/16 1030  anidulafungin (ERAXIS) 200 mg in sodium chloride 0.9 % 200 mL IVPB     200 mg 78 mL/hr over 200 Minutes Intravenous  Once 10/13/16 1018 10/13/16 2052   09/27/16 2200  erythromycin 250 mg in sodium chloride 0.9 % 100 mL IVPB  Status:  Discontinued     250 mg 100 mL/hr over 60 Minutes Intravenous Every 8 hours 09/27/16 1842 09/27/16 1959   09/27/16 2200  erythromycin (EES) 400 MG/5ML suspension 250 mg  Status:  Discontinued     250 mg Oral Every 8 hours 09/27/16 1959 09/29/16 1147   09/24/16 1300  fluconazole (DIFLUCAN) tablet 400 mg  Status:  Discontinued     400 mg Oral Daily 09/24/16 1138 10/13/16 0953   09/21/16 1400  fluconazole (DIFLUCAN) IVPB 400 mg  Status:  Discontinued     400 mg 100 mL/hr over 120 Minutes Intravenous Every 24 hours 09/20/16 1220 09/20/16 1221   09/21/16 0000  anidulafungin (ERAXIS) 50 mg in sodium chloride 0.9 % 50 mL IVPB  Status:  Discontinued     50 mg 78 mL/hr over 50 Minutes Intravenous Every 24 hours 09/19/16 0345 09/19/16 0346   09/20/16 1400  fluconazole (DIFLUCAN) IVPB 800 mg  Status:  Discontinued     800 mg 200 mL/hr over 120 Minutes Intravenous  Once 09/20/16 1220 09/20/16 1221   09/20/16 1400  fluconazole (DIFLUCAN) IVPB 400 mg  Status:  Discontinued     400 mg 100 mL/hr over 120 Minutes Intravenous Every 24 hours 09/20/16 1221 09/24/16 1137   09/19/16 2200  anidulafungin (ERAXIS)  100 mg in sodium chloride 0.9 % 100 mL IVPB  Status:  Discontinued     100 mg 78 mL/hr over 100 Minutes Intravenous Every 24 hours 09/19/16 0345 09/20/16 1220   09/19/16 0400  anidulafungin (ERAXIS) 200 mg in sodium chloride 0.9 % 200 mL IVPB     200 mg 78 mL/hr over 200 Minutes Intravenous  Once  09/19/16 0345 09/19/16 0819   09/18/16 1800  fluconazole (DIFLUCAN) IVPB 100 mg  Status:  Discontinued     100 mg 50 mL/hr over 60 Minutes Intravenous Every 24 hours 09/18/16 1109 09/19/16 1937   09/17/16 1800  ceFEPIme (MAXIPIME) 2 g in dextrose 5 % 50 mL IVPB  Status:  Discontinued     2 g 100 mL/hr over 30 Minutes Intravenous Every 24 hours 09/17/16 1243 09/21/16 1200   09/17/16 1800  fluconazole (DIFLUCAN) IVPB 200 mg  Status:  Discontinued     200 mg 100 mL/hr over 60 Minutes Intravenous Every 24 hours 09/17/16 1651 09/18/16 1109   09/12/16 1400  ceFEPIme (MAXIPIME) 1 g in dextrose 5 % 50 mL IVPB  Status:  Discontinued     1 g 100 mL/hr over 30 Minutes Intravenous Every 8 hours 09/12/16 1127 09/17/16 1243   09/11/16 1600  metroNIDAZOLE (FLAGYL) IVPB 500 mg  Status:  Discontinued     500 mg 100 mL/hr over 60 Minutes Intravenous Every 8 hours 09/11/16 0902 09/21/16 1200   09/11/16 1400  ceFEPIme (MAXIPIME) 1 g in dextrose 5 % 50 mL IVPB  Status:  Discontinued     1 g 100 mL/hr over 30 Minutes Intravenous Every 24 hours 09/11/16 1234 09/12/16 1127   09/10/16 2000  vancomycin (VANCOCIN) IVPB 1000 mg/200 mL premix  Status:  Discontinued     1,000 mg 200 mL/hr over 60 Minutes Intravenous Every 24 hours 09/07/2016 2130 09/10/16 1616   09/10/16 1800  vancomycin (VANCOCIN) IVPB 750 mg/150 ml premix  Status:  Discontinued     750 mg 150 mL/hr over 60 Minutes Intravenous Every 12 hours 09/10/16 1616 09/12/16 1145   09/10/16 1400  levofloxacin (LEVAQUIN) IVPB 500 mg  Status:  Discontinued     500 mg 100 mL/hr over 60 Minutes Intravenous Every 24 hours 10/01/2016 2112 09/11/16 1220   09/10/16 0115   vancomycin (VANCOCIN) IVPB 1000 mg/200 mL premix  Status:  Discontinued     1,000 mg 200 mL/hr over 60 Minutes Intravenous  Once 09/10/16 0110 09/10/16 0118   09/29/2016 2200  metroNIDAZOLE (FLAGYL) IVPB 500 mg  Status:  Discontinued     500 mg 100 mL/hr over 60 Minutes Intravenous Every 6 hours 09/16/2016 2110 09/11/16 0902   09/14/2016 2115  vancomycin (VANCOCIN) IVPB 1000 mg/200 mL premix     1,000 mg 200 mL/hr over 60 Minutes Intravenous  Once 09/03/2016 2108 09/13/2016 2323   09/27/2016 1430  levofloxacin (LEVAQUIN) IVPB 750 mg     750 mg 100 mL/hr over 90 Minutes Intravenous  Once 09/18/2016 1415 09/15/2016 2152   09/25/2016 1430  metroNIDAZOLE (FLAGYL) IVPB 500 mg     500 mg 100 mL/hr over 60 Minutes Intravenous  Once 09/05/2016 1415 09/23/2016 1850   09/12/2016 1430  levofloxacin (LEVAQUIN) IVPB 750 mg  Status:  Discontinued     750 mg 100 mL/hr over 90 Minutes Intravenous  Once 09/14/2016 1416 09/25/2016 1416     Subjective: Seen and examined at bedside this AM and was standing with the help of PT. Adamantly refused Cortrak. No Nausea or vomiting and wanted to talk to Cardiology. No overnight events reported.   Objective: Vitals:   10/19/16 0400 10/19/16 0858 10/19/16 1200 10/19/16 1400  BP: 133/71 (!) 146/63 (!) 146/97   Pulse: 90 87    Resp: '17 17 18   ' Temp: 98.6 F (37 C) (!) 97.4 F (36.3 C)  98.5 F (36.9 C)  TempSrc: Oral Axillary  Axillary  SpO2: 100% 100% 100%   Weight:      Height:        Intake/Output Summary (Last 24 hours) at 10/19/16 2013 Last data filed at 10/19/16 1343  Gross per 24 hour  Intake              720 ml  Output              900 ml  Net             -180 ml   Filed Weights   10/12/16 0400 10/13/16 0400 10/14/16 0400  Weight: 69.9 kg (154 lb) 69.4 kg (153 lb 1.6 oz) 69.9 kg (154 lb 3.2 oz)   Examination: Physical Exam:  Constitutional: Obese Caucasian female who is more alert today but is still slightly disoriented Eyes: Sclerae anicteric. Conjunctivae  non-injected; Pupils a little dilated ENMT: Grossly normal hearing. MMM. External ears and nose appear normal Neck: Supple with no JVD Respiratory: Diminished to auscultation. No wheezing/rales/rhonchi Cardiovascular: Tachycardic rate but regular rhythm. No appreciable edema today Abdomen: Soft, NT, ND. Bowel sounds present GU: Deferred Musculoskeletal: No contractures. No cyanosis Skin: Warm and dry. Has diffuse ecchymosis on upper chest and legs  Neurologic: CN 2-12 grossly intact. No appreciable focal deficits Psychiatric: Not agitated today and was more alert but still a little disoriented. Impaired judgement and insight. Awake and alert  Data Reviewed: I have personally reviewed following labs and imaging studies  CBC:  Recent Labs Lab 10/15/16 1946 10/16/16 0448 10/17/16 0505 10/18/16 0403 10/19/16 0410  WBC 12.4* 15.5* 14.3* 13.3* 10.4  NEUTROABS 9.4* 12.8* 11.4*  --  8.2*  HGB 8.1* 8.7* 8.0* 8.8* 7.7*  HCT 25.7* 27.6* 25.6* 27.9* 23.6*  MCV 98.1 98.2 98.8 98.9 98.7  PLT 146* 169 170 206 482   Basic Metabolic Panel:  Recent Labs Lab 10/15/16 1912 10/16/16 0448 10/17/16 0505 10/18/16 0403 10/19/16 0410  NA 142 139 142 142 141  K 3.4* 3.7 3.1* 3.4* 2.5*  CL 114* 114* 113* 112* 109  CO2 24 21* '24 26 28  ' GLUCOSE 100* 114* 97 112* 98  BUN '10 12 12 11 8  ' CREATININE 0.87 0.98 0.96 0.91 0.77  CALCIUM 7.1* 7.4* 7.3* 7.5* 7.1*  MG 1.6* 1.9 1.9 1.5* 1.6*  PHOS 3.4 3.4 3.9  --  3.0   GFR: Estimated Creatinine Clearance: 70.1 mL/min (by C-G formula based on SCr of 0.77 mg/dL). Liver Function Tests:  Recent Labs Lab 10/15/16 1912 10/16/16 0448 10/17/16 0505 10/18/16 0403 10/19/16 0410  AST '20 21 18 17 15  ' ALT '15 15 16 15 ' 11*  ALKPHOS 345* 441* 511* 541* 436*  BILITOT 0.4 0.6 1.0 0.7 0.7  PROT 4.9* 4.9* 4.7* 5.1* 4.5*  ALBUMIN 1.3* 1.4* 1.3* 1.5* 1.3*   No results for input(s): LIPASE, AMYLASE in the last 168 hours. No results for input(s): AMMONIA in the  last 168 hours. Coagulation Profile: No results for input(s): INR, PROTIME in the last 168 hours. Cardiac Enzymes:  Recent Labs Lab 10/15/16 0301 10/15/16 1912  TROPONINI 0.05* 0.07*   BNP (last 3 results) No results for input(s): PROBNP in the last 8760 hours. HbA1C: No results for input(s): HGBA1C in the last 72 hours. CBG:  Recent Labs Lab 10/18/16 1140 10/18/16 1614 10/18/16 2207 10/19/16 1144 10/19/16 1631  GLUCAP 157* 112* 125* 156* 130*   Lipid Profile: No results for input(s): CHOL, HDL, LDLCALC, TRIG, CHOLHDL, LDLDIRECT in the last 72 hours. Thyroid Function Tests:  No results for input(s): TSH, T4TOTAL, FREET4, T3FREE, THYROIDAB in the last 72 hours. Anemia Panel: No results for input(s): VITAMINB12, FOLATE, FERRITIN, TIBC, IRON, RETICCTPCT in the last 72 hours. Sepsis Labs:  Recent Labs Lab 10/15/16 0301  LATICACIDVEN 1.2    Recent Results (from the past 240 hour(s))  Culture, blood (routine x 2)     Status: None   Collection Time: 10/13/16  6:58 PM  Result Value Ref Range Status   Specimen Description BLOOD LEFT HAND  Final   Special Requests IN PEDIATRIC BOTTLE Blood Culture adequate volume  Final   Culture NO GROWTH 5 DAYS  Final   Report Status 10/18/2016 FINAL  Final  Culture, blood (routine x 2)     Status: None   Collection Time: 10/13/16  7:21 PM  Result Value Ref Range Status   Specimen Description BLOOD RIGHT HAND  Final   Special Requests IN PEDIATRIC BOTTLE Blood Culture adequate volume  Final   Culture NO GROWTH 5 DAYS  Final   Report Status 10/18/2016 FINAL  Final   Radiology Studies: Dg Abd Portable 1v  Result Date: 10/18/2016 CLINICAL DATA:  Small bowel obstruction EXAM: PORTABLE ABDOMEN - 1 VIEW COMPARISON:  10/14/2016 FINDINGS: Descending thoracic aortic stent. Catheter is present with tip near the cavoatrial junction. Suspected retrocardiac airspace opacity. The nasogastric tube has been removed. A loop of bowel extending  transversely across the mid Adam may represent moderately dilated small-bowel (up to 5.1 cm) or possibly gas within the transverse colon. I favor the former. Other loops of small bowel did not appear dilated. IMPRESSION: 1. Suspected moderately dilated loop of small bowel in the upper abdomen. Other loops of small bowel did not appear dilated. The appearance is abnormal but nonspecific and could be from local ileus or early obstruction. 2. Retrocardiac opacity, cannot exclude left lower lobe airspace opacity. Electronically Signed   By: Van Clines M.D.   On: 10/18/2016 13:05   Scheduled Meds: . ALPRAZolam  0.25 mg Oral QHS  . aspirin EC  81 mg Oral Daily  . dronabinol  5 mg Oral BID AC  . feeding supplement  1 Container Oral Q24H  . feeding supplement (ENSURE ENLIVE)  237 mL Oral BID BM  . insulin aspart  0-5 Units Subcutaneous QHS  . insulin aspart  0-9 Units Subcutaneous TID WC  . lisdexamfetamine  20 mg Oral Daily  . metoprolol tartrate  7.5 mg Intravenous Q6H  . potassium chloride  40 mEq Oral BID  . scopolamine  1 patch Transdermal Q72H  . sodium chloride flush  10-40 mL Intracatheter Q12H  . sodium chloride flush  3 mL Intravenous Q12H   Continuous Infusions: . anidulafungin Stopped (10/19/16 1659)  . cefTRIAXone (ROCEPHIN)  IV Stopped (10/19/16 1502)  . famotidine (PEPCID) IV Stopped (10/19/16 1025)  . Marland KitchenTPN (CLINIMIX-E) Adult 30 mL/hr at 10/19/16 1757   And  . fat emulsion 250 mL (10/19/16 1757)  . metronidazole Stopped (10/19/16 1906)    LOS: 69 days   Kerney Elbe, DO [facility] Pager [phone]  If 7PM-7AM, please contact night-coverage [email or web address] Password [name] 10/19/2016, 8:13 PM

## 2016-10-19 NOTE — Progress Notes (Signed)
CSW continuing to follow for disposition planning. Left message last week for patient's selected SNF to update that patient not yet ready for discharge. SNF will need to know hen discharge approaching to initiate insurance authorization. CSW to follow and support.  Estanislado Emms, [place]

## 2016-10-19 NOTE — Progress Notes (Addendum)
Physical Therapy Treatment Patient Details Name: Haley Graham MRN: [id number] DOB: 1955-08-11 Today's Date: 10/19/2016    History of Present Illness This 61 y.o. female admitted with sepsis, hypotension, dehydration, amemia, COPD, AKI due to poor oral intake and hypotension/dehydration.  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 Pt currently with functional limitations due to balance and endurance deficits as well as pt very self limiting at times needing max encouragement to participate.  Pt was able to stand to Stedy 2x with mod assist +2.  Distracted pt by playing music.  4/4 goals unmet due to poor participation and medical issues.  REvised goals today with goals downgraded due to lack of progress.  Will continue and progress pt as she allows. Pt will benefit from skilled PT to increase their independence and safety with mobility to allow discharge to the venue listed below.     Follow Up Recommendations  SNF     Equipment Recommendations  None recommended by PT    Recommendations for Other Services       Precautions / Restrictions Precautions Precautions: Fall Restrictions Weight Bearing Restrictions: No    Mobility  Bed Mobility Overal bed mobility: Needs Assistance Bed Mobility: Supine to Sit;Sit to Supine     Supine to sit: Mod assist     General bed mobility comments: assist to elevate trunk into sitting and used pad to scoot pt to EOB; multimodal cues for technique  Transfers Overall transfer level: Needs assistance Equipment used: 2 person hand held assist Transfers: Sit to/from Stand Sit to Stand: +2 physical assistance;Mod assist;From elevated surface Stand pivot transfers: +2 physical assistance;Total assist       General transfer comment: assist to power up into standing with  multimodal cues for positioning and safety;  pt unable to achieve full erect posture but  was able to stand close to full and cues to maintain stand for 15 seconds each stand and pt was able to stand x2 in [place].  Played praise music to distract pt.     Ambulation/Gait                 Stairs            Wheelchair Mobility    Modified Rankin (Stroke Patients Only)       Balance Overall balance assessment: Needs assistance Sitting-balance support: Feet supported;Bilateral upper extremity supported Sitting balance-Leahy Scale: Fair Sitting balance - Comments: Min guard assist EOB to maintain sitting balance with bil UE supported.    Standing balance support: Bilateral upper extremity supported Standing balance-Leahy Scale: Poor Standing balance comment: With Bil UE support and support on either side, pt able to stand with use of Stedy                            Cognition Arousal/Alertness: Awake/alert Behavior During Therapy: Restless Overall Cognitive Status: Impaired/Different from baseline Area of Impairment: Following commands;Problem solving;Orientation;Attention;Safety/judgement;Awareness;Memory                 Orientation Level: Place;Situation;Disoriented to;Time Current Attention Level: Focused Memory: Decreased short-term memory Following Commands: Follows one step commands with increased time;Follows one step commands inconsistently Safety/Judgement: Decreased awareness of safety;Decreased awareness of deficits Awareness: Intellectual Problem Solving: Slow processing;Requires verbal cues;Difficulty sequencing;Requires tactile cues;Decreased initiation General  Comments: pt was very confused and hallucinating       Exercises Total Joint Exercises Long Arc Quad: AROM;Both;Seated;10 reps General Exercises - Lower Extremity Ankle Circles/Pumps: AROM;10 reps;Both;Supine Heel Slides: Both;Supine;AROM;10 reps    General Comments General  comments (skin integrity, edema, etc.): Pt was on 6LHFNC with sats 100%.  HR 115-138 bpm.       Pertinent Vitals/Pain Pain Assessment: Faces Faces Pain Scale: Hurts little more Pain Location: generalized Pain Descriptors / Indicators: Grimacing Pain Intervention(s): Limited activity within patient's tolerance;Monitored during session;Repositioned    Home Living                      Prior Function            PT Goals (current goals can now be found in the care plan section) Acute Rehab PT Goals PT Goal Formulation: With patient/family Time For Goal Achievement: 11/02/16 Potential to Achieve Goals: Fair Progress towards PT goals: Progressing toward goals    Frequency    Min 2X/week      PT Plan Current plan remains appropriate    Co-evaluation              AM-PAC PT "6 Clicks" Daily Activity  Outcome Measure  Difficulty turning over in bed (including adjusting bedclothes, sheets and blankets)?: Unable Difficulty moving from lying on back to sitting on the side of the bed? : Unable Difficulty sitting down on and standing up from a chair with arms (e.g., wheelchair, bedside commode, etc,.)?: Unable Help needed moving to and from a bed to chair (including a wheelchair)?: A Lot Help needed walking in hospital room?: Total Help needed climbing 3-5 steps with a railing? : Total 6 Click Score: 7    End of Session Equipment Utilized During Treatment: Gait belt;Oxygen (6LHFNC) Activity Tolerance: Other (comment) (limited by cognition) Patient left: with call bell/phone within reach;in chair;with chair alarm set Nurse Communication: Mobility status;Need for lift equipment Charlaine Dalton) PT Visit Diagnosis: Muscle weakness (generalized) (M62.81);Other abnormalities of gait and mobility (R26.89);Unsteadiness on feet (R26.81);Adult, failure to thrive (R62.7)     Time: 0165-5374 PT Time Calculation (min) (ACUTE ONLY): 27 min  Charges:  $Therapeutic Exercise: 8-22  mins $Therapeutic Activity: 8-22 mins                    G Codes:       Leonidus Rowand,PT Acute Rehabilitation [phone] (pager)    [name] 10/19/2016, 10:39 AM

## 2016-10-19 NOTE — Progress Notes (Signed)
On rounds patient stated she needed to use the restroom, attempted to assist to the bedside commode. Patient was able to sit up with minimal assistance but was too weak to stand, despite assistance x2 staff members, walker and gait belt. Will pass to oncoming shift.

## 2016-10-19 NOTE — Progress Notes (Signed)
   Notes reviewed. Plan is for high risk diagnostic cardiac catheterization tomorrow via the right radial artery approach. Dr. Burt Knack.  Replete potassium per primary team.  No new recommendations.  Candee Furbish, MD

## 2016-10-19 NOTE — Progress Notes (Signed)
Patient ID: Haley Graham, female   DOB: [birth year], 61 y.o.   MRN: [id number]  [facility] Surgery Progress Note     Subjective: CC- ileus vs SBO Sitting up in bed. NAE over night. Patient has sipped on Ensure and orange juice but that's about it. She denies abdominal pain, nausea, or vomiting but she is just not hungry. Last BM was on Saturday. She is passing flatus.  Objective: Vital signs in last 24 hours: Temp:  [97.4 F (36.3 C)-98.6 F (37 C)] 97.4 F (36.3 C) (09/17 0858) Pulse Rate:  [87-98] 87 (09/17 0858) Resp:  [16-20] 17 (09/17 0858) BP: (131-146)/(54-71) 146/63 (09/17 0858) SpO2:  [99 %-100 %] 100 % (09/17 0858) Last BM Date: 10/17/16  Intake/Output from previous day: 09/16 0701 - 09/17 0700 In: 403 [I.V.:3; IV Piggyback:400] Out: 2200 [Urine:2200] Intake/Output this shift: No intake/output data recorded.  PE: Gen: Alert, NAD, pleasant HEENT: EOM's intact, pupils equal and round Card: tachy Pulm: CTAB, no W/R/R, effort normal Abd: Soft, nondistended, +BS, no HSM, no hernia, nontender Psych: A&Ox3  Skin: no rashes noted, warm and dry   Lab Results:   Recent Labs  10/18/16 0403 10/19/16 0410  WBC 13.3* 10.4  HGB 8.8* 7.7*  HCT 27.9* 23.6*  PLT 206 169   BMET  Recent Labs  10/18/16 0403 10/19/16 0410  NA 142 141  K 3.4* 2.5*  CL 112* 109  CO2 26 28  GLUCOSE 112* 98  BUN 11 8  CREATININE 0.91 0.77  CALCIUM 7.5* 7.1*   PT/INR No results for input(s): LABPROT, INR in the last 72 hours. CMP     Component Value Date/Time   NA 141 10/19/2016 0410   K 2.5 (LL) 10/19/2016 0410   CL 109 10/19/2016 0410   CO2 28 10/19/2016 0410   GLUCOSE 98 10/19/2016 0410   BUN 8 10/19/2016 0410   CREATININE 0.77 10/19/2016 0410   CALCIUM 7.1 (L) 10/19/2016 0410   PROT 4.5 (L) 10/19/2016 0410   ALBUMIN 1.3 (L) 10/19/2016 0410   AST 15 10/19/2016 0410   ALT 11 (L) 10/19/2016 0410   ALKPHOS 436 (H) 10/19/2016 0410   BILITOT 0.7 10/19/2016 0410    GFRNONAA >60 10/19/2016 0410   GFRAA >60 10/19/2016 0410   Lipase     Component Value Date/Time   LIPASE 14 09/03/2016 1139       Studies/Results: Dg Abd Portable 1v  Result Date: 10/18/2016 CLINICAL DATA:  Small bowel obstruction EXAM: PORTABLE ABDOMEN - 1 VIEW COMPARISON:  10/14/2016 FINDINGS: Descending thoracic aortic stent. Catheter is present with tip near the cavoatrial junction. Suspected retrocardiac airspace opacity. The nasogastric tube has been removed. A loop of bowel extending transversely across the mid Adam may represent moderately dilated small-bowel (up to 5.1 cm) or possibly gas within the transverse colon. I favor the former. Other loops of small bowel did not appear dilated. IMPRESSION: 1. Suspected moderately dilated loop of small bowel in the upper abdomen. Other loops of small bowel did not appear dilated. The appearance is abnormal but nonspecific and could be from local ileus or early obstruction. 2. Retrocardiac opacity, cannot exclude left lower lobe airspace opacity. Electronically Signed   By: Van Clines M.D.   On: 10/18/2016 13:05    Anti-infectives: Anti-infectives    Start     Dose/Rate Route Frequency Ordered Stop   10/16/16 1400  cefTRIAXone (ROCEPHIN) 2 g in dextrose 5 % 50 mL IVPB     2 g 100 mL/hr  over 30 Minutes Intravenous Every 24 hours 10/16/16 0810     10/14/16 1500  anidulafungin (ERAXIS) 100 mg in sodium chloride 0.9 % 100 mL IVPB     100 mg 78 mL/hr over 100 Minutes Intravenous Every 24 hours 10/13/16 1018 11/25/16 1459   10/13/16 1100  cefTRIAXone (ROCEPHIN) 2 g in dextrose 5 % 50 mL IVPB - Premix  Status:  Discontinued     2 g 100 mL/hr over 30 Minutes Intravenous Every 24 hours 10/13/16 1016 10/16/16 0810   10/13/16 1100  metroNIDAZOLE (FLAGYL) IVPB 500 mg     500 mg 100 mL/hr over 60 Minutes Intravenous Every 8 hours 10/13/16 1016     10/13/16 1030  anidulafungin (ERAXIS) 200 mg in sodium chloride 0.9 % 200 mL IVPB     200  mg 78 mL/hr over 200 Minutes Intravenous  Once 10/13/16 1018 10/13/16 2052   09/27/16 2200  erythromycin 250 mg in sodium chloride 0.9 % 100 mL IVPB  Status:  Discontinued     250 mg 100 mL/hr over 60 Minutes Intravenous Every 8 hours 09/27/16 1842 09/27/16 1959   09/27/16 2200  erythromycin (EES) 400 MG/5ML suspension 250 mg  Status:  Discontinued     250 mg Oral Every 8 hours 09/27/16 1959 09/29/16 1147   09/24/16 1300  fluconazole (DIFLUCAN) tablet 400 mg  Status:  Discontinued     400 mg Oral Daily 09/24/16 1138 10/13/16 0953   09/21/16 1400  fluconazole (DIFLUCAN) IVPB 400 mg  Status:  Discontinued     400 mg 100 mL/hr over 120 Minutes Intravenous Every 24 hours 09/20/16 1220 09/20/16 1221   09/21/16 0000  anidulafungin (ERAXIS) 50 mg in sodium chloride 0.9 % 50 mL IVPB  Status:  Discontinued     50 mg 78 mL/hr over 50 Minutes Intravenous Every 24 hours 09/19/16 0345 09/19/16 0346   09/20/16 1400  fluconazole (DIFLUCAN) IVPB 800 mg  Status:  Discontinued     800 mg 200 mL/hr over 120 Minutes Intravenous  Once 09/20/16 1220 09/20/16 1221   09/20/16 1400  fluconazole (DIFLUCAN) IVPB 400 mg  Status:  Discontinued     400 mg 100 mL/hr over 120 Minutes Intravenous Every 24 hours 09/20/16 1221 09/24/16 1137   09/19/16 2200  anidulafungin (ERAXIS) 100 mg in sodium chloride 0.9 % 100 mL IVPB  Status:  Discontinued     100 mg 78 mL/hr over 100 Minutes Intravenous Every 24 hours 09/19/16 0345 09/20/16 1220   09/19/16 0400  anidulafungin (ERAXIS) 200 mg in sodium chloride 0.9 % 200 mL IVPB     200 mg 78 mL/hr over 200 Minutes Intravenous  Once 09/19/16 0345 09/19/16 0819   09/18/16 1800  fluconazole (DIFLUCAN) IVPB 100 mg  Status:  Discontinued     100 mg 50 mL/hr over 60 Minutes Intravenous Every 24 hours 09/18/16 1109 09/19/16 1937   09/17/16 1800  ceFEPIme (MAXIPIME) 2 g in dextrose 5 % 50 mL IVPB  Status:  Discontinued     2 g 100 mL/hr over 30 Minutes Intravenous Every 24 hours  09/17/16 1243 09/21/16 1200   09/17/16 1800  fluconazole (DIFLUCAN) IVPB 200 mg  Status:  Discontinued     200 mg 100 mL/hr over 60 Minutes Intravenous Every 24 hours 09/17/16 1651 09/18/16 1109   09/12/16 1400  ceFEPIme (MAXIPIME) 1 g in dextrose 5 % 50 mL IVPB  Status:  Discontinued     1 g 100 mL/hr over 30 Minutes Intravenous Every  8 hours 09/12/16 1127 09/17/16 1243   09/11/16 1600  metroNIDAZOLE (FLAGYL) IVPB 500 mg  Status:  Discontinued     500 mg 100 mL/hr over 60 Minutes Intravenous Every 8 hours 09/11/16 0902 09/21/16 1200   09/11/16 1400  ceFEPIme (MAXIPIME) 1 g in dextrose 5 % 50 mL IVPB  Status:  Discontinued     1 g 100 mL/hr over 30 Minutes Intravenous Every 24 hours 09/11/16 1234 09/12/16 1127   09/10/16 2000  vancomycin (VANCOCIN) IVPB 1000 mg/200 mL premix  Status:  Discontinued     1,000 mg 200 mL/hr over 60 Minutes Intravenous Every 24 hours 09/22/2016 2130 09/10/16 1616   09/10/16 1800  vancomycin (VANCOCIN) IVPB 750 mg/150 ml premix  Status:  Discontinued     750 mg 150 mL/hr over 60 Minutes Intravenous Every 12 hours 09/10/16 1616 09/12/16 1145   09/10/16 1400  levofloxacin (LEVAQUIN) IVPB 500 mg  Status:  Discontinued     500 mg 100 mL/hr over 60 Minutes Intravenous Every 24 hours 09/16/2016 2112 09/11/16 1220   09/10/16 0115  vancomycin (VANCOCIN) IVPB 1000 mg/200 mL premix  Status:  Discontinued     1,000 mg 200 mL/hr over 60 Minutes Intravenous  Once 09/10/16 0110 09/10/16 0118   09/03/2016 2200  metroNIDAZOLE (FLAGYL) IVPB 500 mg  Status:  Discontinued     500 mg 100 mL/hr over 60 Minutes Intravenous Every 6 hours 09/20/2016 2110 09/11/16 0902   09/05/2016 2115  vancomycin (VANCOCIN) IVPB 1000 mg/200 mL premix     1,000 mg 200 mL/hr over 60 Minutes Intravenous  Once 10/02/2016 2108 09/29/2016 2323   09/14/2016 1430  levofloxacin (LEVAQUIN) IVPB 750 mg     750 mg 100 mL/hr over 90 Minutes Intravenous  Once 09/26/2016 1415 09/27/2016 2152   10/01/2016 1430  metroNIDAZOLE  (FLAGYL) IVPB 500 mg     500 mg 100 mL/hr over 60 Minutes Intravenous  Once 09/19/2016 1415 09/05/2016 1850   09/23/2016 1430  levofloxacin (LEVAQUIN) IVPB 750 mg  Status:  Discontinued     750 mg 100 mL/hr over 90 Minutes Intravenous  Once 09/26/2016 1416 09/13/2016 1416       Assessment/Plan Sepsis due to candidemia Splenic infarcts CHF Prolonged QTC Anemia Depression Respiratory failure/Acute pulmonary edema - improving CHF/cardiomyopathy - LAD ischemia. Considering cath this week if patient tolerating PO's as she would need to be on dual antiplatelet therapy if stent is placed. Severe protein calorie malnutrition- cleared for FLD but patient not taking in many PO's. Will order Cortrak.  SBO vs Ileus - CT scan 9/11 showed early or partial small bowel obstruction with transition zone in the right lower quadrant - having bowel function, tolerating small amount of PO's  FEN - IVF, FLD VTE - SCDs Foley - in place Follow up - TBD  Plan - Continue full liquids as tolerated. Will order Cortrak and dietician consult. Encourage OOB. Hypokalemia being replaced. Cath with cardiology Tuesday or Wednesday?   LOS: 40 days    Wellington Hampshire , [facility] Surgery 10/19/2016, 8:59 AM Pager: [phone] Consults: [phone] Mon-Fri 7:00 am-4:30 pm Sat-Sun 7:00 am-11:30 am

## 2016-10-19 NOTE — Progress Notes (Signed)
CRITICAL VALUE ALERT  Critical Value:  K 2.5  Date & Time Notied:  10/19/16 0455  Provider Notified: Kennon Holter with [facility]  Orders Received/Actions taken: Text paged provider at 0500 awaiting response. Provider also made aware of Mg 1.6 this morning as well. Will continue to monitor.

## 2016-10-20 ENCOUNTER — Encounter (HOSPITAL_COMMUNITY): Admission: EM | Disposition: E | Payer: Self-pay | Source: Home / Self Care | Attending: Internal Medicine

## 2016-10-20 DIAGNOSIS — I248 Other forms of acute ischemic heart disease: Secondary | ICD-10-CM

## 2016-10-20 DIAGNOSIS — I2510 Atherosclerotic heart disease of native coronary artery without angina pectoris: Secondary | ICD-10-CM

## 2016-10-20 HISTORY — PX: LEFT HEART CATH AND CORONARY ANGIOGRAPHY: CATH118249

## 2016-10-20 LAB — CBC WITH DIFFERENTIAL/PLATELET
Basophils Absolute: 0 10*3/uL (ref 0.0–0.1)
Basophils Relative: 0 %
Eosinophils Absolute: 0 10*3/uL (ref 0.0–0.7)
Eosinophils Relative: 0 %
HCT: 25 % — ABNORMAL LOW (ref 36.0–46.0)
Hemoglobin: 7.9 g/dL — ABNORMAL LOW (ref 12.0–15.0)
Lymphocytes Relative: 20 %
Lymphs Abs: 2 10*3/uL (ref 0.7–4.0)
MCH: 31.2 pg (ref 26.0–34.0)
MCHC: 31.6 g/dL (ref 30.0–36.0)
MCV: 98.8 fL (ref 78.0–100.0)
Monocytes Absolute: 0.8 10*3/uL (ref 0.1–1.0)
Monocytes Relative: 8 %
Neutro Abs: 7.1 10*3/uL (ref 1.7–7.7)
Neutrophils Relative %: 72 %
Platelets: 206 10*3/uL (ref 150–400)
RBC: 2.53 MIL/uL — ABNORMAL LOW (ref 3.87–5.11)
RDW: 20 % — ABNORMAL HIGH (ref 11.5–15.5)
WBC: 9.9 10*3/uL (ref 4.0–10.5)

## 2016-10-20 LAB — COMPREHENSIVE METABOLIC PANEL
ALT: 9 U/L — ABNORMAL LOW (ref 14–54)
AST: 17 U/L (ref 15–41)
Albumin: 1.3 g/dL — ABNORMAL LOW (ref 3.5–5.0)
Alkaline Phosphatase: 429 U/L — ABNORMAL HIGH (ref 38–126)
Anion gap: 4 — ABNORMAL LOW (ref 5–15)
BUN: 9 mg/dL (ref 6–20)
CO2: 24 mmol/L (ref 22–32)
Calcium: 7.4 mg/dL — ABNORMAL LOW (ref 8.9–10.3)
Chloride: 108 mmol/L (ref 101–111)
Creatinine, Ser: 0.69 mg/dL (ref 0.44–1.00)
GFR calc Af Amer: >60 mL/min (ref >60)
GFR calc non Af Amer: >60 mL/min (ref >60)
Glucose, Bld: 124 mg/dL — ABNORMAL HIGH (ref 65–99)
Potassium: 3.6 mmol/L (ref 3.5–5.1)
Sodium: 136 mmol/L (ref 135–145)
Total Bilirubin: 0.7 mg/dL (ref 0.3–1.2)
Total Protein: 4.2 g/dL — ABNORMAL LOW (ref 6.5–8.1)

## 2016-10-20 LAB — GLUCOSE, CAPILLARY
GLUCOSE-CAPILLARY: 130 mg/dL — AB (ref 65–99)
GLUCOSE-CAPILLARY: 142 mg/dL — AB (ref 65–99)
Glucose-Capillary: 136 mg/dL — ABNORMAL HIGH (ref 65–99)
Glucose-Capillary: 132 mg/dL — ABNORMAL HIGH (ref 65–99)
Glucose-Capillary: 147 mg/dL — ABNORMAL HIGH (ref 65–99)

## 2016-10-20 LAB — MAGNESIUM: MAGNESIUM: 1.8 mg/dL (ref 1.7–2.4)

## 2016-10-20 LAB — PHOSPHORUS: Phosphorus: 2.6 mg/dL (ref 2.5–4.6)

## 2016-10-20 LAB — PROTIME-INR
INR: 1.06
PROTHROMBIN TIME: 13.7 s (ref 11.4–15.2)

## 2016-10-20 LAB — TRIGLYCERIDES: Triglycerides: 337 mg/dL — ABNORMAL HIGH (ref <150)

## 2016-10-20 LAB — PREALBUMIN: Prealbumin: 14.8 mg/dL — ABNORMAL LOW (ref 18–38)

## 2016-10-20 SURGERY — LEFT HEART CATH AND CORONARY ANGIOGRAPHY
Anesthesia: LOCAL

## 2016-10-20 MED ORDER — MIDAZOLAM HCL 2 MG/2ML IJ SOLN
INTRAMUSCULAR | Status: AC
  Filled 2016-10-20: qty 2

## 2016-10-20 MED ORDER — MIDAZOLAM HCL 2 MG/2ML IJ SOLN
INTRAMUSCULAR | Status: DC | PRN
  Administered 2016-10-20: 0.5 mg via INTRAVENOUS

## 2016-10-20 MED ORDER — SODIUM CHLORIDE 0.9 % IV SOLN: INTRAVENOUS | Status: DC

## 2016-10-20 MED ORDER — HEPARIN (PORCINE) IN NACL 2-0.9 UNIT/ML-% IJ SOLN
INTRAMUSCULAR | Status: AC | PRN
  Administered 2016-10-20: 1000 mL

## 2016-10-20 MED ORDER — HEPARIN SODIUM (PORCINE) 1000 UNIT/ML IJ SOLN
INTRAMUSCULAR | Status: DC | PRN
  Administered 2016-10-20: 3000 [IU] via INTRAVENOUS

## 2016-10-20 MED ORDER — IOPAMIDOL (ISOVUE-370) INJECTION 76%
INTRAVENOUS | Status: DC | PRN
  Administered 2016-10-20: 45 mL via INTRA_ARTERIAL

## 2016-10-20 MED ORDER — ASPIRIN 81 MG PO CHEW: 81.0000 mg | CHEWABLE_TABLET | ORAL | Status: AC

## 2016-10-20 MED ORDER — VERAPAMIL HCL 2.5 MG/ML IV SOLN
INTRAVENOUS | Status: AC
  Filled 2016-10-20: qty 2

## 2016-10-20 MED ORDER — LIDOCAINE HCL 2 % IJ SOLN
INTRAMUSCULAR | Status: AC
  Filled 2016-10-20: qty 10

## 2016-10-20 MED ORDER — FENTANYL CITRATE (PF) 100 MCG/2ML IJ SOLN
INTRAMUSCULAR | Status: AC
  Filled 2016-10-20: qty 2

## 2016-10-20 MED ORDER — SODIUM CHLORIDE 0.9 % IV SOLN: 250.0000 mL | INTRAVENOUS | Status: DC | PRN

## 2016-10-20 MED ORDER — HEPARIN (PORCINE) IN NACL 2-0.9 UNIT/ML-% IJ SOLN
INTRAMUSCULAR | Status: AC
  Filled 2016-10-20: qty 1000

## 2016-10-20 MED ORDER — SODIUM CHLORIDE 0.9% FLUSH: 3.0000 mL | Freq: Two times a day (BID) | INTRAVENOUS | Status: DC

## 2016-10-20 MED ORDER — VERAPAMIL HCL 2.5 MG/ML IV SOLN
INTRAVENOUS | Status: DC | PRN
  Administered 2016-10-20: 8 mL via INTRA_ARTERIAL

## 2016-10-20 MED ORDER — TRACE MINERALS CR-CU-MN-SE-ZN 10-1000-500-60 MCG/ML IV SOLN
INTRAVENOUS | Status: AC
  Administered 2016-10-20: 21:00:00 via INTRAVENOUS
  Filled 2016-10-20: qty 1440

## 2016-10-20 MED ORDER — INSULIN ASPART 100 UNIT/ML ~~LOC~~ SOLN
2.0000 [IU] | Freq: Four times a day (QID) | SUBCUTANEOUS | Status: DC
  Administered 2016-10-20 – 2016-10-21 (×5): 2 [IU] via SUBCUTANEOUS
  Administered 2016-10-21 – 2016-10-22 (×4): 4 [IU] via SUBCUTANEOUS
  Administered 2016-10-22: 2 [IU] via SUBCUTANEOUS
  Administered 2016-10-23 (×2): 4 [IU] via SUBCUTANEOUS

## 2016-10-20 MED ORDER — LIDOCAINE HCL (PF) 1 % IJ SOLN
INTRAMUSCULAR | Status: DC | PRN
  Administered 2016-10-20: 4 mL

## 2016-10-20 MED ORDER — FENTANYL CITRATE (PF) 100 MCG/2ML IJ SOLN
INTRAMUSCULAR | Status: DC | PRN
  Administered 2016-10-20: 12.5 ug via INTRAVENOUS
  Administered 2016-10-20: 25 ug via INTRAVENOUS

## 2016-10-20 MED ORDER — IOPAMIDOL (ISOVUE-370) INJECTION 76%
INTRAVENOUS | Status: AC
  Filled 2016-10-20: qty 50

## 2016-10-20 MED ORDER — SODIUM CHLORIDE 0.9% FLUSH: 3.0000 mL | INTRAVENOUS | Status: DC | PRN

## 2016-10-20 SURGICAL SUPPLY — 10 items
CATH INFINITI 5 FR JL3.5 (CATHETERS) ×1 IMPLANT
CATH INFINITI JR4 5F (CATHETERS) ×1 IMPLANT
DEVICE RAD COMP TR BAND LRG (VASCULAR PRODUCTS) ×1 IMPLANT
GLIDESHEATH SLEND SS 6F .021 (SHEATH) ×1 IMPLANT
GUIDEWIRE INQWIRE 1.5J.035X260 (WIRE) IMPLANT
INQWIRE 1.5J .035X260CM (WIRE) ×2
KIT HEART LEFT (KITS) ×2 IMPLANT
PACK CARDIAC CATHETERIZATION (CUSTOM PROCEDURE TRAY) ×2 IMPLANT
TRANSDUCER W/STOPCOCK (MISCELLANEOUS) ×2 IMPLANT
TUBING CIL FLEX 10 FLL-RA (TUBING) ×2 IMPLANT

## 2016-10-20 NOTE — Progress Notes (Signed)
Patient ID: Haley Graham, female   DOB: 03-Sep-1955, 61 y.o.   MRN: [id number]    This NP visited patient at the bedside as a follow up to palliative needs and emotional support for both patient and family.  Husband present and has spent the night.  Patient continues to be intermittently confused.  Husbandvv tells me patient will have cardiac cath today.  Offered discussion  regarding overall complex medical situation and emotional components for both patient and family, husband tells me he is "OK".  Encouraged him to call with questions or concerns  Attempted to answer questions and offer emotional support.    No charge Wadie Lessen NP  Palliative Medicine Team Team Phone # [phone] Pager [phone]  No charge  Wadie Lessen NP  Palliative Medicine Team Team Phone # [phone] Pager [phone]

## 2016-10-20 NOTE — Interval H&P Note (Signed)
History and Physical Interval Note:  10/08/2016 5:04 PM  Haley Graham  has presented today for surgery, with the diagnosis of CAD, CHF  The various methods of treatment have been discussed with the patient and family. After consideration of risks, benefits and other options for treatment, the patient has consented to  Procedure(s): LEFT HEART CATH AND CORONARY ANGIOGRAPHY (N/A) as a surgical intervention .  The patient's history has been reviewed, patient examined, no change in status, stable for surgery.  I have reviewed the patient's chart and labs.  Questions were answered to the patient's satisfaction.     Sherren Mocha

## 2016-10-20 NOTE — Progress Notes (Signed)
Occupational Therapy Treatment Patient Details Name: Haley Graham MRN: [id number] DOB: 12-Sep-1955 Today's Date: 10/05/2016    History of present illness This 61 y.o. female admitted with sepsis, hypotension, dehydration, amemia, COPD, AKI due to poor oral intake and hypotension/dehydration.  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 Pt adamantly declined OOB mobility and ADL participation at EOB today despite maximum encouragement and education. She was agreeable to bed level B UE strengthening exercises and grooming tasks and demonstrates significant weakness and fatigue with simple movements. Discussed need to progress with mobility in order to go home as pt is motivated by this. OT will continue to follow while admitted. Initial goal achievement timeline has passes and goals remain appropriate. Updated care plans section of chart.     Follow Up Recommendations  SNF;Supervision/Assistance - 24 hour    Equipment Recommendations  None recommended by OT    Recommendations for Other Services      Precautions / Restrictions Precautions Precautions: Fall Precaution Comments: NG tube Restrictions Weight Bearing Restrictions: No       Mobility Bed Mobility   Bed Mobility: Supine to Sit;Sit to Supine              Transfers                      Balance                                           ADL either performed or assessed with clinical judgement   ADL Overall ADL's : Needs assistance/impaired     Grooming: Set up;Bed level                                 General ADL Comments: Attempted for >30 minutes to motivate pt to complete activities at EOB with pt adamantly refusing to  participate.      Vision       Perception     Praxis      Cognition Arousal/Alertness: Awake/alert Behavior During Therapy: Restless Overall Cognitive Status: Impaired/Different from baseline Area of Impairment: Following commands;Problem solving;Orientation;Attention;Safety/judgement;Awareness;Memory                 Orientation Level: Disoriented to;Person;Place;Time;Situation Current Attention Level: Focused Memory: Decreased short-term memory Following Commands: Follows one step commands with increased time;Follows one step commands inconsistently Safety/Judgement: Decreased awareness of safety;Decreased awareness of deficits Awareness: Intellectual Problem Solving: Slow processing;Requires verbal cues;Difficulty sequencing;Requires tactile cues;Decreased initiation General Comments: Pt very confused and with one episode of hallucination.        Exercises Exercises: General Upper Extremity General Exercises - Upper Extremity Shoulder Flexion: AROM;Both;10 reps;Supine Elbow Flexion: Strengthening;Both;10 reps (with 2# hand weight) Wrist Flexion: AROM;Both;10 reps Digit Composite Flexion: Strengthening;Both;10 reps (using squeeze ball)   Shoulder Instructions       General Comments      Pertinent Vitals/ Pain       Pain Assessment: Faces Faces Pain Scale: Hurts even more Pain Location: generalized; abdomen Pain Descriptors / Indicators: Grimacing Pain Intervention(s): Limited activity within patient's tolerance;Monitored during session;Repositioned  Home Living  Prior Functioning/Environment              Frequency  Min 2X/week        Progress Toward Goals  OT Goals(current goals can now be found in the care plan section)  Progress towards OT goals: Not progressing toward goals - comment (increased agitation and minimal participation)  Acute Rehab OT Goals Patient Stated Goal: to  have her test done OT Goal Formulation: With patient Time For Goal Achievement: 11/03/16 Potential to Achieve Goals: Fair ADL Goals Pt Will Perform Grooming: with min assist;sitting Pt Will Perform Upper Body Bathing: with min assist;sitting Pt Will Perform Lower Body Bathing: with mod assist;sit to/from stand Pt Will Perform Upper Body Dressing: with mod assist;sitting Pt Will Perform Lower Body Dressing: with max assist;sit to/from stand Pt Will Transfer to Toilet: with min assist;stand pivot transfer;bedside commode Pt Will Perform Toileting - Clothing Manipulation and hygiene: with mod assist;sit to/from stand  Plan Discharge plan remains appropriate    Co-evaluation                 AM-PAC PT "6 Clicks" Daily Activity     Outcome Measure   Help from another person eating meals?: Total Help from another person taking care of personal grooming?: A Little Help from another person toileting, which includes using toliet, bedpan, or urinal?: A Lot Help from another person bathing (including washing, rinsing, drying)?: A Lot Help from another person to put on and taking off regular upper body clothing?: A Lot Help from another person to put on and taking off regular lower body clothing?: Total 6 Click Score: 11    End of Session Equipment Utilized During Treatment: Gait belt  OT Visit Diagnosis: Unsteadiness on feet (R26.81);Muscle weakness (generalized) (M62.81) Pain - part of body:  (back; abdomen)   Activity Tolerance Treatment limited secondary to agitation   Patient Left in bed;with call bell/phone within reach;with family/visitor present   Nurse Communication Mobility status        Time: 6073-7106 OT Time Calculation (min): 42 min  Charges: OT General Charges $OT Visit: 1 Visit OT Treatments $Self Care/Home Management : 8-22 mins $Therapeutic Activity: 8-22 mins $Therapeutic Exercise: 8-22 mins  Norman Herrlich, MS OTR/L  Pager: [place]  A Missi Mcmackin 10/16/2016, 3:06 PM

## 2016-10-20 NOTE — Progress Notes (Addendum)
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 She presented with weakness and met sepsis criteria initially with concern for enteritis.  She grew a yeast UTI and on 8/18 1/2 blood cultures grew yeast and the patient was started on eraxis.  She was switched to fluconazole and antibiotics were d/c'd on 8/20 with blood cultures only growing candida.  ID recommended continuing fluconazole 400 mg daily through 9/26 and recommended ophtho exam within the next month.     She had an echo on 8/28 which showed new systolic dysfunction and her QT was markedly prolonged so she was changed back from fluconazole to Eraxis until 9/26.  Cardiology was consulted and she was diuresed.   On 9/10 she developed new nausea and vomiting.  KUB showed concern for a SBO.  CT confirmed this and also showed evidence of possible enteritis.  Repeat blood cultures obtained and started on ceftriaxone/flagyl.   Given severe malnutrition will need to consider TPN for Mrs. Andreas and won't be able to place PICC until blood Cx are Negative x 3 days per ID reccs.   On 9/12 ECHO was repeated and was essentially the same; Deferring to Cardiology for further invasive procedures such as angiography and likely to be done early next if stable  Patient had Rapid Response on the night of 10/15/16 and ended up on BiPAP and being diuresed. She improved but continued to be delirious. This AM NGT was removed by Surgery and patient was placed on FULL Liquid  Diet. TPN was ordered for Patient but held due to evaluating if she could tolerate a FULL Diet and possibly TF through [place].  Another Rapid Response on 10/16/16 was called on the patient as patient was increasingly Tachypenic on BiPAP. I went to assess the patient and ordered a Stat CXR, ABG, and gave an additional 40 of IV Lasix. Had some flash pulmonary edema. Patient was maintaining saturations but still had a tenuous respiratory status so PCCM was called to evaluate and after evaluation patient improved and will remain in SDU with low threshold to transfer to ICU if she deteriorates. PCCM evaluated today and because patient was doing better they signed off the case.   9/15  patient was resting well, and per husband was the best she has rested in a while. Discussed plan of care with Surgery and were to place a Cortrak for TF however husband did not want to have it placed today and will hold off and re-assess Monday for TF.   9/16  patient was still slightly confused and was given another dose of IV Lasix. Possible Cortrak vs. TPN in AM if still is not eating significantly. Surgery is repeating Abdominal X-Ray in AM. Plan is for High Risk Diagnostic and Possible Interventional Cardiac Cath by Dr. Burt Knack on Tuesday.   Patient refused Cortrak 9/17 and will proceed with TPN. Plan is still for Cardiac Cath in AM. Patient's K+ was severely low this AM so currently repeating. Updated Husband over the phone.   Patient went for Cath today   Assessment & Plan:   Principal Problem:   Candidemia ([place])  Active Problems:   GERD (gastroesophageal reflux disease)   Dissection of thoracoabdominal aorta (HCC)   AAA (abdominal aortic aneurysm) (HCC)   Benign essential HTN   Diabetes mellitus type 2 in obese (HCC)   Anxiety state   Hyponatremia   Leukocytosis   Sepsis (HCC)   Hypotension   AKI (acute kidney injury) ([place])   Dehydration   Generalized weakness   Anemia   Thrombocytopenia (HCC)    Abnormal computed tomography angiography (CTA) of abdomen and pelvis   Colitis: Probable   Protein-calorie malnutrition, severe   Palliative care by specialist   Adult failure to thrive   Hypophosphatemia   Hypomagnesemia   Depression   Acute systolic (congestive) heart failure (HCC)   DNR (do not resuscitate) discussion   Congestive heart failure (HCC)   Heme positive stool   Cardiac LV ejection fraction 30-35%   QT prolongation   Acute hypercapnic respiratory failure (HCC)   Metabolic encephalopathy   Pulmonary edema cardiac cause (HCC)   Acute respiratory distress   Encephalopathy acute   Hypokalemia   Demand ischemia (HCC)  Acute Hypercapnic Respiratory Failure Requiring NIPPV -Decompensated overnight and ended up on BiPAP -Given Aggressive Diuresis with IV Lasix; Given IV Lasix 40 this AM -PCCM Evaluated and Improved   -ABG showed improvement -C/w NIPPV with BiPAP qHS and prn and c/w [place] -Maintain Saturations well on [place] -PCCM Signed off  Small Bowel Obstruction  Enteritis?, improving - Surgery followin, SB, protocol, KUB showed no convincing radiographic improvement in apparent mid/distal small bowel obstruction. -C/w ceftriaxone/flagyl (Day 8) for evidence of ? enteritis, although with her history, may be related to mesenteric ischemia. repeat blood cx as well due to tachy, but normal WBC, without fevers. Low Threshold to de-escalate after 10 days of therapy - D5 NS at 75 mL/hr ordered x 3 days stopped as patient went into volume overload and flash pulmonary edema -NGT Removed -Diet Advanced to FULL's -Patient ordered TPN but will Hold off given tenuous Respiratory Status and want to see if she tolerates FULLS and possibly another Cortrak with TF; -Discussed case with Surgery PA who thought Cortrak was a good idea but patient tolerated FULLS and husband wants to re-assess Cortrak on Monday.  -If patient is not eating significantly will need Cortrak for TF or  TPN -Discussed with Surgery again 9/17 and patient to get Cortrak for TF but patient adamantly refused so will proceed with TPN -Continue with TPN for now  Sepsis due to Candidemia, improved  -Sepsis Physiology improved  - discussed with ID, ID to see, switched to eraxis (caspofungin not on formulary) after discussion with ID -> continue until 9/26  -Case was discussed with Dr. Valetta Close 9/7 who recommended follow up after discharge for ophthalmologic exam -Some concern for thrush, ctm on eraxis, she's been on fluconazole  -Per ID continue Anidulafungin for 6 weeks; -Blood Cx x 2 on 10/13/16 showed NGTD at 5 days -PICC placed 10/15/16 at the recommendation of ID; C/w TPN  Splenic Infarcts:   -Unclear cause, new since previous study per rads.   -Possibly related to thrombosis or dissection within aorta, though thrombosis is infrarenal and celiac comes off true lumen.    Acute systolic congestive heart failure:  - Holding lasix 20 mg PO daily, with tachycardia/N/V with above SBO -Repeat echo with EF 35-40% and grade 2 diastolic dysfunction as well as hypokinesis of mid-apicalanteroseptal, anterior, and inferolateral myocardium (mildly improved from 8/28 echo at 30-35%) -Follow up cardiology recs Cardiology rec  f/u in clinic in 4-6 weeks with repeat echo -> Cards repeated ECHO 10/15/11 which was essentially unchanged -May needLexiscan Myoview versus cath but will defer to Cardiology.Denied shortness of breath or chest pain today. Continue to monitor urine output and electrolytes. Cardiology consult appreciated. -C/w Diuresis per Cards;  -IV Furosemide 40 mg given this AM -On beta blocker, consider ace/arb in future -Cardiology would potentially consider invasive angiography and likely compelled to proceed with Coronary Angiography to clarify diagnosis as it is less likely stress cardiomyopathy and likely LAD stenosis and ischemia. -Patient underwent Cardiac Cath today and showed Non-obstructive  Disease and Cardiology recommended continuing Medical Management   Prolonged QTC: Over 600 initially and improved.   -D/c fluconazole.  D/c tramadol and hold cymbalta.  Zofran discontinued.  -Discontinue Azithromycin and abilify.  Monitor in telemetry. Repeat EKG.  Tachycardia:  -Continue to Monitor on Telemetry  Severe protein calorie malnutrition:  -Nutritionist Consulted -Cortrak was dc'd.  Will need to consider TPN again but now that NGT is removed will hold off and see if patient will tolerate FULLs and another Cortrak to allow for TF.   -PICC Placed incase TPN to be started again -Cortrak was to be placed yesterday but patient tolerated her diet and Husband wants to hold off and re-evaluate Monday. Patient still had inadequate Nutrition so Cortrak was to be placed today to start TF but patient refused and does not want Cortrak -Will proceed with TPN;  -Nutritionist on board and Changing Ensure Enlive from TID to BID and starting Boost Breeze daily -Continue to Monitor Mag, Phos, and K+ Daily given risk of Refeeding Syndrome   Acute kidney Injury  -Improved; BUN/Cr is stable at 8/0.77 -Continue to Monitor CMP's  Acute on Chronic anemia likely due to chronic disease vs acute GI bleed:  -Iron stores acceptable. Fecal occult blood test positive. On Protonix. Status post 2 unit of red blood cell transfusion on 8/30 with improvement in hemoglobin. Evaluated by GI. No further endoscopy at this time.  -Hemoglobin/Hct dropped slightly from 8.8/27.9 -> 7.7/23.6  But now increased -> 7.9/25.0 -Monitor CBC and if Drops further will transfuse pRBCs  Thrombocytopenia likely due to acute illness, improved  -Monitor CBC. Platelet count up and down over past few days.  -Continue to Monitor; Platelet Count now 206  Depression and Anxiety:  -Evaluated by psychiatrist.   -Continue Xanax, Holding cymbalta, andVyvanse per psychiatrist  Hypomagnesemia -Mag was 1.6 and improved to  1.8 -Repeat Mag Level in AM  Loose stools, improved:  -Negative C. Diff; Possibly from SBO -Having normal BM's  Acute Delirium, improving  -C/w Delirium precautions -Continue to Redirect as appropriate -Continue to try to treat underlying causes -Resting comfortably and is alert but still a little confused  Hypokalemia -Patient's K+ was 2.5 and improved to 3.6 -Replete as Necessary  -Continue to Monitor and repeat CMP in AM  Goals of care:  -Palliative following in the background and will return as needed.    Leukocytosis, improved  -On Abx -WBC went from 12.4 -> 15.5 -> 14.3 -> 13.3 -> 10.4 -> 9.9 -?Reactive -Recent Blood Cx show NGTD at 5 days -May need to Pan-Culture Again and broaden Abx as she is on Ceftriaxone and Flagyl -Continue to Monitor and Repeat CBC in AM   DVT prophylaxis: SCDs Code Status: FULL CODE Family Communication: Discussed with Husband over the phone Disposition Plan: SNF when medically Stable  Consultants:   Cardiology  Psychiatry  Infectious Diseases  Palliative Care Medicine  Gastroenterology  General Surgery  Nutrition  Discussed Case with Vascular   PCCM   Procedures:   Thoracentesis 8/17  Echo 8/28 EF 30-35%, akinesis of mid-apicalanteroseptal and anterior myocardium. Diastolic dysfunction.  Echo 8/13 EF 09-62, diastolic dysfunction, lipomatous hypertrophy  Echo 9/4 EF 35-40%, hypokinesis of mid apicalanteroseptal, anterior, and inferolateral myocardium, grade 2 diastolic dysfunction -Echo 9/12 There was mild concentric hypertrophy. Systolic function was moderately reduced. The estimated ejection fraction was in the range of 35% to 40%. There is akinesis of the mid-apicalanteroseptal, anterior,   anterolateral, lateral, and apical myocardium. The study is not technically sufficient to allow evaluation of LV diastolic  Cardiac Catheterization Procedures   LEFT HEART CATH AND CORONARY ANGIOGRAPHY  Conclusion   1.  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 Normal LVEDP    Antimicrobials:  Anti-infectives    Start     Dose/Rate Route Frequency Ordered Stop   10/16/16 1400  cefTRIAXone (ROCEPHIN) 2 g in dextrose 5 % 50 mL IVPB     2 g 100 mL/hr over 30 Minutes Intravenous Every 24 hours 10/16/16 0810     10/14/16 1500  anidulafungin (ERAXIS) 100 mg in sodium chloride 0.9 % 100 mL IVPB     100 mg 78 mL/hr over 100 Minutes Intravenous Every 24 hours 10/13/16 1018 11/25/16 1459   10/13/16 1100  cefTRIAXone (ROCEPHIN) 2 g in dextrose 5 % 50 mL IVPB - Premix  Status:  Discontinued     2 g 100 mL/hr over 30 Minutes Intravenous Every 24 hours 10/13/16 1016 10/16/16 0810   10/13/16 1100  metroNIDAZOLE (FLAGYL) IVPB 500 mg     500 mg 100 mL/hr over 60 Minutes Intravenous Every 8 hours 10/13/16 1016     10/13/16 1030  anidulafungin (ERAXIS) 200 mg in sodium chloride 0.9 % 200 mL IVPB     200 mg 78 mL/hr over 200 Minutes Intravenous  Once 10/13/16 1018 10/13/16 2052   09/27/16 2200  erythromycin 250 mg in sodium chloride 0.9 % 100 mL IVPB  Status:  Discontinued     250 mg 100 mL/hr over 60 Minutes Intravenous Every 8 hours 09/27/16 1842 09/27/16 1959   09/27/16 2200  erythromycin (EES) 400 MG/5ML suspension 250 mg  Status:  Discontinued     250 mg Oral Every 8 hours 09/27/16 1959 09/29/16 1147   09/24/16 1300  fluconazole (DIFLUCAN) tablet 400 mg  Status:  Discontinued     400 mg Oral Daily 09/24/16 1138 10/13/16 0953   09/21/16 1400  fluconazole (DIFLUCAN) IVPB 400 mg  Status:  Discontinued     400 mg 100 mL/hr over 120 Minutes Intravenous Every 24 hours 09/20/16 1220 09/20/16 1221   09/21/16 0000  anidulafungin (ERAXIS) 50 mg in sodium chloride 0.9 % 50 mL IVPB  Status:  Discontinued     50 mg 78 mL/hr over 50 Minutes Intravenous Every 24 hours 09/19/16 0345 09/19/16 0346   09/20/16 1400  fluconazole (DIFLUCAN) IVPB  800 mg  Status:  Discontinued     800 mg 200 mL/hr over 120 Minutes Intravenous  Once 09/20/16 1220 09/20/16 1221   09/20/16 1400  fluconazole (DIFLUCAN) IVPB 400 mg  Status:  Discontinued     400 mg 100 mL/hr over 120 Minutes Intravenous Every 24 hours 09/20/16 1221 09/24/16 1137   09/19/16 2200  anidulafungin (ERAXIS) 100 mg in sodium chloride 0.9 % 100 mL IVPB  Status:  Discontinued  100 mg 78 mL/hr over 100 Minutes Intravenous Every 24 hours 09/19/16 0345 09/20/16 1220   09/19/16 0400  anidulafungin (ERAXIS) 200 mg in sodium chloride 0.9 % 200 mL IVPB     200 mg 78 mL/hr over 200 Minutes Intravenous  Once 09/19/16 0345 09/19/16 0819   09/18/16 1800  fluconazole (DIFLUCAN) IVPB 100 mg  Status:  Discontinued     100 mg 50 mL/hr over 60 Minutes Intravenous Every 24 hours 09/18/16 1109 09/19/16 1937   09/17/16 1800  ceFEPIme (MAXIPIME) 2 g in dextrose 5 % 50 mL IVPB  Status:  Discontinued     2 g 100 mL/hr over 30 Minutes Intravenous Every 24 hours 09/17/16 1243 09/21/16 1200   09/17/16 1800  fluconazole (DIFLUCAN) IVPB 200 mg  Status:  Discontinued     200 mg 100 mL/hr over 60 Minutes Intravenous Every 24 hours 09/17/16 1651 09/18/16 1109   09/12/16 1400  ceFEPIme (MAXIPIME) 1 g in dextrose 5 % 50 mL IVPB  Status:  Discontinued     1 g 100 mL/hr over 30 Minutes Intravenous Every 8 hours 09/12/16 1127 09/17/16 1243   09/11/16 1600  metroNIDAZOLE (FLAGYL) IVPB 500 mg  Status:  Discontinued     500 mg 100 mL/hr over 60 Minutes Intravenous Every 8 hours 09/11/16 0902 09/21/16 1200   09/11/16 1400  ceFEPIme (MAXIPIME) 1 g in dextrose 5 % 50 mL IVPB  Status:  Discontinued     1 g 100 mL/hr over 30 Minutes Intravenous Every 24 hours 09/11/16 1234 09/12/16 1127   09/10/16 2000  vancomycin (VANCOCIN) IVPB 1000 mg/200 mL premix  Status:  Discontinued     1,000 mg 200 mL/hr over 60 Minutes Intravenous Every 24 hours 09/08/2016 2130 09/10/16 1616   09/10/16 1800  vancomycin (VANCOCIN) IVPB 750  mg/150 ml premix  Status:  Discontinued     750 mg 150 mL/hr over 60 Minutes Intravenous Every 12 hours 09/10/16 1616 09/12/16 1145   09/10/16 1400  levofloxacin (LEVAQUIN) IVPB 500 mg  Status:  Discontinued     500 mg 100 mL/hr over 60 Minutes Intravenous Every 24 hours 09/15/2016 2112 09/11/16 1220   09/10/16 0115  vancomycin (VANCOCIN) IVPB 1000 mg/200 mL premix  Status:  Discontinued     1,000 mg 200 mL/hr over 60 Minutes Intravenous  Once 09/10/16 0110 09/10/16 0118   09/16/2016 2200  metroNIDAZOLE (FLAGYL) IVPB 500 mg  Status:  Discontinued     500 mg 100 mL/hr over 60 Minutes Intravenous Every 6 hours 09/04/2016 2110 09/11/16 0902   09/20/2016 2115  vancomycin (VANCOCIN) IVPB 1000 mg/200 mL premix     1,000 mg 200 mL/hr over 60 Minutes Intravenous  Once 09/13/2016 2108 09/13/2016 2323   09/30/2016 1430  levofloxacin (LEVAQUIN) IVPB 750 mg     750 mg 100 mL/hr over 90 Minutes Intravenous  Once 09/08/2016 1415 09/20/2016 2152   09/02/2016 1430  metroNIDAZOLE (FLAGYL) IVPB 500 mg     500 mg 100 mL/hr over 60 Minutes Intravenous  Once 09/23/2016 1415 09/26/2016 1850   09/05/2016 1430  levofloxacin (LEVAQUIN) IVPB 750 mg  Status:  Discontinued     750 mg 100 mL/hr over 90 Minutes Intravenous  Once 09/21/2016 1416 09/08/2016 1416     Subjective: Seen and examined at bedside this AM and stated she had a headache this AM and that her back was hurting from laying in bed. Anxious about Cath. No CP or SOB   Objective: Vitals:   10/08/2016 1724  10/21/2016 1729 10/23/2016 1734 10/25/2016 1739  BP: (!) 173/88 (!) 146/82 (!) 168/71 (!) 172/88  Pulse: 96 96 96 97  Resp: 15 12 (!) 8 14  Temp:      TempSrc:      SpO2: 100% (!) 81% 100% 100%  Weight:      Height:        Intake/Output Summary (Last 24 hours) at 10/09/2016 1840 Last data filed at 10/30/2016 1300  Gross per 24 hour  Intake              751 ml  Output              200 ml  Net              551 ml   Filed Weights   10/13/16 0400 10/14/16 0400 11/01/2016 0400    Weight: 69.4 kg (153 lb 1.6 oz) 69.9 kg (154 lb 3.2 oz) 69.9 kg (154 lb 1.6 oz)   Examination: Physical Exam:  Constitutional: Obese Caucasian female who is in NAD but complaining of a headache  Eyes: Sclerae anicteric. Lids normal ENMT: Grossly normal hearing. MMM. External ears and nose appear normal Neck: Supple with no JVD Respiratory: Diminished but no appreciable wheezing/rales/rhonchi. Patient was not tachypenic this AM Cardiovascular: RRR; S1 S2; No appreciable LE Edema Abdomen: Soft, NT, ND. Bowel sounds present.  GU: Deferred Musculoskeletal: No contractures; No cyanosis Skin: Warm and Dry. Ecchymosis diffusely noted on knees thighs, and upper chest Neurologic: CN 2-12 grossly intact. No appreciable focal deficits Psychiatric: Not agitated. Was alert this AM. Still has impaired judgement and insight   Data Reviewed: I have personally reviewed following labs and imaging studies  CBC:  Recent Labs Lab 10/15/16 1946 10/16/16 0448 10/17/16 0505 10/18/16 0403 10/19/16 0410 10/18/2016 0408  WBC 12.4* 15.5* 14.3* 13.3* 10.4 9.9  NEUTROABS 9.4* 12.8* 11.4*  --  8.2* 7.1  HGB 8.1* 8.7* 8.0* 8.8* 7.7* 7.9*  HCT 25.7* 27.6* 25.6* 27.9* 23.6* 25.0*  MCV 98.1 98.2 98.8 98.9 98.7 98.8  PLT 146* 169 170 206 169 332   Basic Metabolic Panel:  Recent Labs Lab 10/15/16 1912 10/16/16 0448 10/17/16 0505 10/18/16 0403 10/19/16 0410 10/06/2016 0408  NA 142 139 142 142 141 136  K 3.4* 3.7 3.1* 3.4* 2.5* 3.6  CL 114* 114* 113* 112* 109 108  CO2 24 21* _0 GLUCOSE 100* 114* 97 112* 98 124*  BUN _1 CREATININE 0.87 0.98 0.96 0.91 0.77 0.69  CALCIUM 7.1* 7.4* 7.3* 7.5* 7.1* 7.4*  MG 1.6* 1.9 1.9 1.5* 1.6* 1.8  PHOS 3.4 3.4 3.9  --  3.0 2.6   GFR: Estimated Creatinine Clearance: 70.1 mL/min (by C-G formula based on SCr of 0.69 mg/dL). Liver Function Tests:  Recent Labs Lab 10/16/16 0448 10/17/16 0505 10/18/16 0403 10/19/16 0410 10/13/2016 0408  AST  _2 ALT _3 11* 9*  ALKPHOS 441* 511* 541* 436* 429*  BILITOT 0.6 1.0 0.7 0.7 0.7  PROT 4.9* 4.7* 5.1* 4.5* 4.2*  ALBUMIN 1.4* 1.3* 1.5* 1.3* 1.3*   No results for input(s): LIPASE, AMYLASE in the last 168 hours. No results for input(s): AMMONIA in the last 168 hours. Coagulation Profile:  Recent Labs Lab 10/17/2016 1431  INR 1.06   Cardiac Enzymes:  Recent Labs Lab 10/15/16 0301 10/15/16 1912  TROPONINI 0.05* 0.07*   BNP (last 3 results) No results for input(s): PROBNP  in the last 8760 hours. HbA1C: No results for input(s): HGBA1C in the last 72 hours. CBG:  Recent Labs Lab 10/19/16 1144 10/19/16 1631 10/19/16 2053 10/27/2016 0607 10/24/2016 1137  GLUCAP 156* 130* 138* 136* 132*   Lipid Profile:  Recent Labs  10/18/2016 0408  TRIG 337*   Thyroid Function Tests: No results for input(s): TSH, T4TOTAL, FREET4, T3FREE, THYROIDAB in the last 72 hours. Anemia Panel: No results for input(s): VITAMINB12, FOLATE, FERRITIN, TIBC, IRON, RETICCTPCT in the last 72 hours. Sepsis Labs:  Recent Labs Lab 10/15/16 0301  LATICACIDVEN 1.2    Recent Results (from the past 240 hour(s))  Culture, blood (routine x 2)     Status: None   Collection Time: 10/13/16  6:58 PM  Result Value Ref Range Status   Specimen Description BLOOD LEFT HAND  Final   Special Requests IN PEDIATRIC BOTTLE Blood Culture adequate volume  Final   Culture NO GROWTH 5 DAYS  Final   Report Status 10/18/2016 FINAL  Final  Culture, blood (routine x 2)     Status: None   Collection Time: 10/13/16  7:21 PM  Result Value Ref Range Status   Specimen Description BLOOD RIGHT HAND  Final   Special Requests IN PEDIATRIC BOTTLE Blood Culture adequate volume  Final   Culture NO GROWTH 5 DAYS  Final   Report Status 10/18/2016 FINAL  Final   Radiology Studies: No results found. Scheduled Meds: . ALPRAZolam  0.25 mg Oral QHS  . aspirin EC  81 mg Oral Daily  . dronabinol  5 mg Oral BID AC  .  feeding supplement  1 Container Oral Q24H  . feeding supplement (ENSURE ENLIVE)  237 mL Oral BID BM  . insulin aspart  2-6 Units Subcutaneous Q6H  . lisdexamfetamine  20 mg Oral Daily  . metoprolol tartrate  7.5 mg Intravenous Q6H  . potassium chloride  40 mEq Oral BID  . scopolamine  1 patch Transdermal Q72H  . sodium chloride flush  10-40 mL Intracatheter Q12H   Continuous Infusions: . Marland KitchenTPN (CLINIMIX-E) Adult    . anidulafungin Stopped (10/19/16 1659)  . cefTRIAXone (ROCEPHIN)  IV Stopped (10/08/2016 1430)  . famotidine (PEPCID) IV Stopped (10/22/2016 1041)  . metronidazole Stopped (10/10/2016 1332)    LOS: 41 days   Kerney Elbe, DO [facility] Pager [phone]  If 7PM-7AM, please contact night-coverage [email or web address] Password [facility] 10/22/2016, 6:40 PM

## 2016-10-20 NOTE — Progress Notes (Signed)
PT Cancellation Note  Patient Details Name: Haley Graham MRN: [id number] DOB: 1955-09-03   Cancelled Treatment:    Reason Eval/Treat Not Completed: Patient declined, no reason specified Pt declined mobility. PT will continue to follow acutely.    [place]ina April, PTA Pager: [phone]   10/07/2016, 2:39 PM

## 2016-10-20 NOTE — Progress Notes (Signed)
Subjective No acute events. +BM. Refused Cortrak yesterday so TPN initiated. Nervous about her cardiac cath procedure today.  Objective: Vital signs in last 24 hours: Temp:  [97.4 F (36.3 C)-98.5 F (36.9 C)] 98.3 F (36.8 C) (09/18 0800) Pulse Rate:  [78-94] 88 (09/18 0800) Resp:  [14-30] 24 (09/18 0800) BP: (142-160)/(64-97) 153/67 (09/18 0800) SpO2:  [100 %] 100 % (09/18 0800) Weight:  [69.9 kg (154 lb 1.6 oz)] 69.9 kg (154 lb 1.6 oz) (09/18 0400) Last BM Date: 10/17/16  Intake/Output from previous day: 09/17 0701 - 09/18 0700 In: 1071 [P.O.:120; I.V.:601; IV Piggyback:350] Out: 900 [Urine:900] Intake/Output this shift: No intake/output data recorded.  Gen: NAD, comfortable CV: RRR Pulm: Normal work of breathing Abd: Soft, NT/ND  Ext: SCDs in place  Lab Results: CBC   Recent Labs  10/19/16 0410 10/18/2016 0408  WBC 10.4 9.9  HGB 7.7* 7.9*  HCT 23.6* 25.0*  PLT 169 206   BMET  Recent Labs  10/19/16 0410 10/19/2016 0408  NA 141 136  K 2.5* 3.6  CL 109 108  CO2 28 24  GLUCOSE 98 124*  BUN 8 9  CREATININE 0.77 0.69  CALCIUM 7.1* 7.4*   PT/INR No results for input(s): LABPROT, INR in the last 72 hours. ABG No results for input(s): PHART, HCO3 in the last 72 hours.  Invalid input(s): PCO2, PO2  Studies/Results:  Anti-infectives: Anti-infectives    Start     Dose/Rate Route Frequency Ordered Stop   10/16/16 1400  cefTRIAXone (ROCEPHIN) 2 g in dextrose 5 % 50 mL IVPB     2 g 100 mL/hr over 30 Minutes Intravenous Every 24 hours 10/16/16 0810     10/14/16 1500  anidulafungin (ERAXIS) 100 mg in sodium chloride 0.9 % 100 mL IVPB     100 mg 78 mL/hr over 100 Minutes Intravenous Every 24 hours 10/13/16 1018 11/25/16 1459   10/13/16 1100  cefTRIAXone (ROCEPHIN) 2 g in dextrose 5 % 50 mL IVPB - Premix  Status:  Discontinued     2 g 100 mL/hr over 30 Minutes Intravenous Every 24 hours 10/13/16 1016 10/16/16 0810   10/13/16 1100  metroNIDAZOLE (FLAGYL) IVPB  500 mg     500 mg 100 mL/hr over 60 Minutes Intravenous Every 8 hours 10/13/16 1016     10/13/16 1030  anidulafungin (ERAXIS) 200 mg in sodium chloride 0.9 % 200 mL IVPB     200 mg 78 mL/hr over 200 Minutes Intravenous  Once 10/13/16 1018 10/13/16 2052   09/27/16 2200  erythromycin 250 mg in sodium chloride 0.9 % 100 mL IVPB  Status:  Discontinued     250 mg 100 mL/hr over 60 Minutes Intravenous Every 8 hours 09/27/16 1842 09/27/16 1959   09/27/16 2200  erythromycin (EES) 400 MG/5ML suspension 250 mg  Status:  Discontinued     250 mg Oral Every 8 hours 09/27/16 1959 09/29/16 1147   09/24/16 1300  fluconazole (DIFLUCAN) tablet 400 mg  Status:  Discontinued     400 mg Oral Daily 09/24/16 1138 10/13/16 0953   09/21/16 1400  fluconazole (DIFLUCAN) IVPB 400 mg  Status:  Discontinued     400 mg 100 mL/hr over 120 Minutes Intravenous Every 24 hours 09/20/16 1220 09/20/16 1221   09/21/16 0000  anidulafungin (ERAXIS) 50 mg in sodium chloride 0.9 % 50 mL IVPB  Status:  Discontinued     50 mg 78 mL/hr over 50 Minutes Intravenous Every 24 hours 09/19/16 0345 09/19/16 0346  09/20/16 1400  fluconazole (DIFLUCAN) IVPB 800 mg  Status:  Discontinued     800 mg 200 mL/hr over 120 Minutes Intravenous  Once 09/20/16 1220 09/20/16 1221   09/20/16 1400  fluconazole (DIFLUCAN) IVPB 400 mg  Status:  Discontinued     400 mg 100 mL/hr over 120 Minutes Intravenous Every 24 hours 09/20/16 1221 09/24/16 1137   09/19/16 2200  anidulafungin (ERAXIS) 100 mg in sodium chloride 0.9 % 100 mL IVPB  Status:  Discontinued     100 mg 78 mL/hr over 100 Minutes Intravenous Every 24 hours 09/19/16 0345 09/20/16 1220   09/19/16 0400  anidulafungin (ERAXIS) 200 mg in sodium chloride 0.9 % 200 mL IVPB     200 mg 78 mL/hr over 200 Minutes Intravenous  Once 09/19/16 0345 09/19/16 0819   09/18/16 1800  fluconazole (DIFLUCAN) IVPB 100 mg  Status:  Discontinued     100 mg 50 mL/hr over 60 Minutes Intravenous Every 24 hours 09/18/16  1109 09/19/16 1937   09/17/16 1800  ceFEPIme (MAXIPIME) 2 g in dextrose 5 % 50 mL IVPB  Status:  Discontinued     2 g 100 mL/hr over 30 Minutes Intravenous Every 24 hours 09/17/16 1243 09/21/16 1200   09/17/16 1800  fluconazole (DIFLUCAN) IVPB 200 mg  Status:  Discontinued     200 mg 100 mL/hr over 60 Minutes Intravenous Every 24 hours 09/17/16 1651 09/18/16 1109   09/12/16 1400  ceFEPIme (MAXIPIME) 1 g in dextrose 5 % 50 mL IVPB  Status:  Discontinued     1 g 100 mL/hr over 30 Minutes Intravenous Every 8 hours 09/12/16 1127 09/17/16 1243   09/11/16 1600  metroNIDAZOLE (FLAGYL) IVPB 500 mg  Status:  Discontinued     500 mg 100 mL/hr over 60 Minutes Intravenous Every 8 hours 09/11/16 0902 09/21/16 1200   09/11/16 1400  ceFEPIme (MAXIPIME) 1 g in dextrose 5 % 50 mL IVPB  Status:  Discontinued     1 g 100 mL/hr over 30 Minutes Intravenous Every 24 hours 09/11/16 1234 09/12/16 1127   09/10/16 2000  vancomycin (VANCOCIN) IVPB 1000 mg/200 mL premix  Status:  Discontinued     1,000 mg 200 mL/hr over 60 Minutes Intravenous Every 24 hours 09/03/2016 2130 09/10/16 1616   09/10/16 1800  vancomycin (VANCOCIN) IVPB 750 mg/150 ml premix  Status:  Discontinued     750 mg 150 mL/hr over 60 Minutes Intravenous Every 12 hours 09/10/16 1616 09/12/16 1145   09/10/16 1400  levofloxacin (LEVAQUIN) IVPB 500 mg  Status:  Discontinued     500 mg 100 mL/hr over 60 Minutes Intravenous Every 24 hours 09/15/2016 2112 09/11/16 1220   09/10/16 0115  vancomycin (VANCOCIN) IVPB 1000 mg/200 mL premix  Status:  Discontinued     1,000 mg 200 mL/hr over 60 Minutes Intravenous  Once 09/10/16 0110 09/10/16 0118   09/26/2016 2200  metroNIDAZOLE (FLAGYL) IVPB 500 mg  Status:  Discontinued     500 mg 100 mL/hr over 60 Minutes Intravenous Every 6 hours 09/23/2016 2110 09/11/16 0902   09/13/2016 2115  vancomycin (VANCOCIN) IVPB 1000 mg/200 mL premix     1,000 mg 200 mL/hr over 60 Minutes Intravenous  Once 09/11/2016 2108 09/10/2016 2323    09/06/2016 1430  levofloxacin (LEVAQUIN) IVPB 750 mg     750 mg 100 mL/hr over 90 Minutes Intravenous  Once 09/29/2016 1415 09/04/2016 2152   09/30/2016 1430  metroNIDAZOLE (FLAGYL) IVPB 500 mg     500 mg  100 mL/hr over 60 Minutes Intravenous  Once 09/21/2016 1415 09/17/2016 1850   09/08/2016 1430  levofloxacin (LEVAQUIN) IVPB 750 mg  Status:  Discontinued     750 mg 100 mL/hr over 90 Minutes Intravenous  Once 09/17/2016 1416 09/03/2016 1416       Assessment/Plan: Patient Active Problem List   Diagnosis Date Noted  . Hypokalemia 10/19/2016  . Acute respiratory distress   . Encephalopathy acute   . Acute hypercapnic respiratory failure ([place])   . Metabolic encephalopathy   . Pulmonary edema cardiac cause ([place])   . QT prolongation 10/06/2016  . Cardiac LV ejection fraction 30-35%   . DNR (do not resuscitate) discussion   . Congestive heart failure ([place])   . Heme positive stool   . Acute systolic (congestive) heart failure ([place])   . Depression   . Candidemia ([place]) 09/21/2016  . Hypophosphatemia 09/21/2016  . Hypomagnesemia 09/21/2016  . Palliative care by specialist   . Adult failure to thrive   . Protein-calorie malnutrition, severe 09/12/2016  . Sepsis ([place]) 09/20/2016  . Hypotension 09/18/2016  . AKI (acute kidney injury) ([place]) 10/02/2016  . Dehydration 09/04/2016  . Generalized weakness 09/06/2016  . Anemia 09/21/2016  . Thrombocytopenia ([place]) 09/10/2016  . Abnormal computed tomography angiography (CTA) of abdomen and pelvis 09/30/2016  . Colitis: Probable 09/19/2016  . AAA (abdominal aortic aneurysm) ([place])   . Abdominal pain   . Ischemic enteritis ([place])   . Benign essential HTN   . Diabetes mellitus type 2 in obese ([place])   . Anxiety state   . Tachycardia   . Tachypnea   . Hyponatremia   . Leukocytosis   . Acute blood loss anemia   . 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 Unstable angina pectoris ([place])   . SOB (shortness of breath)   . 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 Change in stool 05/28/2016  . Gastroparesis 11/19/2011  . Nausea 11/19/2011  . Dysphagia 11/11/2011  . GERD (gastroesophageal reflux disease) 11/11/2011   Sepsis due to candidemia Splenic infarcts CHF Prolonged QTC Anemia Depression Respiratory failure/Acute pulmonary edema - improving CHF/cardiomyopathy - LAD ischemia. Considering cath this week if patient tolerating PO's as she would need to be on dual antiplatelet therapy if stent is placed. Severe protein calorie malnutrition- cleared for FLD but patient not taking in many PO's. Will order Cortrak.  SBO vs Ileus - CT scan 9/11 showed early or partial small bowel obstruction with transition zone in the right lower quadrant - having bowel function, tolerating small amount of PO's + Tube feeds  FEN - IVF, FLD, TFs  VTE - SCDs Foley - in place Follow up - TBD  Plan -Continue full liquids following cath; TPN since refused Cortrak. Albumin noted 1.3. -OOB to chaiar/ambulate -Replace electrolytes - K>4, PO4>3, Mg>2 -Cath today   LOS: 41 days   Ileana Roup, [facility]

## 2016-10-20 NOTE — Progress Notes (Signed)
PHARMACY - ADULT TOTAL PARENTERAL NUTRITION CONSULT NOTE   Pharmacy Consult for TPN Indication: malnutrition  Patient Measurements: Height: _0  (160 cm) Weight: 154 lb 1.6 oz (69.9 kg) IBW/kg (Calculated) : 52.4 TPN AdjBW (KG): 55.7 Body mass index is 27.3 kg/m.  Assessment: 87 yoF with PMH of type b aortic dissection, DM, gastroparesis, GERD, HTN, IBD who presented with enteritis.   Found to have fungal bacteremia with plans to be on fluconazole through 9/26  On 9/10 developed N/V and KUB showed concern for SBO; confirmed by CT  Patient is severely malnourished and family/patient is refusing cortrak placement for TF  GI: concern for ileus - no abd pain, LBM 9/15 Prealbumin 14.8 Endo: CBGs < 150 on ssi Insulin requirements in the past 24 hours: 1 Lytes: K 3.6, Mg 1.8, Phos 2.6 Renal: Scr 0.69 Pulm: had some flash pulm edema requiring bipap - now resolved and pulm signed off Cards: diagnostic cath planned 9/18 Hepatobil: alk phos 436, LFTs WNL Trigs 337 Neuro: no issues ID: fungal bacteremia, intra-abd infection On eraxis, ceftriaxone, flagyl  TPN Access: PICC TPN start date: 9/17  Nutritional Goals (per RD recommendation on 9/12): KCal: 2000 - 2200 / day Protein: 110 - 120 gm / day  Current Nutrition: full liquid but patient not really consuming much, refusing TF  Plan:  Increase Clinimix E 5/15 to 65 ml/hr Hold Lipids today - likely plan to do MWF lipids  Today's TPN provides 72 g of protein and 1022 kCals per day meeting 65% of protein and 51% of kCal needs  Daily MVI and trace elements in TPN Continue SSI q6h Monitor TPN labs, placement of Cortrak TPN Labs Mon, [place]  Levester Fresh, PharmD, BCPS, BCCCP Clinical Pharmacist Clinical phone for 10/08/2016 from 7a-3:30p: [phone] If after 3:30p, please call main pharmacy at: [phone] 10/19/2016 7:50 AM

## 2016-10-20 NOTE — H&P (View-Only) (Signed)
   Notes reviewed. Plan is for high risk diagnostic cardiac catheterization tomorrow via the right radial artery approach. Dr. Burt Knack.  Replete potassium per primary team.  No new recommendations.  Candee Furbish, MD

## 2016-10-21 ENCOUNTER — Encounter (HOSPITAL_COMMUNITY): Payer: Self-pay | Admitting: Cardiovascular Disease

## 2016-10-21 ENCOUNTER — Inpatient Hospital Stay (HOSPITAL_COMMUNITY): Payer: BLUE CROSS/BLUE SHIELD

## 2016-10-21 LAB — COMPREHENSIVE METABOLIC PANEL
ALT: 11 U/L — AB (ref 14–54)
AST: 17 U/L (ref 15–41)
Albumin: 1.3 g/dL — ABNORMAL LOW (ref 3.5–5.0)
Alkaline Phosphatase: 365 U/L — ABNORMAL HIGH (ref 38–126)
Anion gap: 4 — ABNORMAL LOW (ref 5–15)
BUN: 10 mg/dL (ref 6–20)
CHLORIDE: 103 mmol/L (ref 101–111)
CO2: 27 mmol/L (ref 22–32)
CREATININE: 0.67 mg/dL (ref 0.44–1.00)
Calcium: 7.2 mg/dL — ABNORMAL LOW (ref 8.9–10.3)
GFR calc Af Amer: >60 mL/min (ref >60)
GLUCOSE: 114 mg/dL — AB (ref 65–99)
Potassium: 3.6 mmol/L (ref 3.5–5.1)
SODIUM: 134 mmol/L — AB (ref 135–145)
Total Bilirubin: 0.5 mg/dL (ref 0.3–1.2)
Total Protein: 4.4 g/dL — ABNORMAL LOW (ref 6.5–8.1)

## 2016-10-21 LAB — CBC WITH DIFFERENTIAL/PLATELET
BASOS ABS: 0 10*3/uL (ref 0.0–0.1)
Basophils Relative: 0 %
EOS PCT: 0 %
Eosinophils Absolute: 0 10*3/uL (ref 0.0–0.7)
HCT: 24 % — ABNORMAL LOW (ref 36.0–46.0)
Hemoglobin: 7.8 g/dL — ABNORMAL LOW (ref 12.0–15.0)
LYMPHS PCT: 22 %
Lymphs Abs: 2.1 10*3/uL (ref 0.7–4.0)
MCH: 32.4 pg (ref 26.0–34.0)
MCHC: 32.5 g/dL (ref 30.0–36.0)
MCV: 99.6 fL (ref 78.0–100.0)
Monocytes Absolute: 0.9 10*3/uL (ref 0.1–1.0)
Monocytes Relative: 9 %
Neutro Abs: 6.8 10*3/uL (ref 1.7–7.7)
Neutrophils Relative %: 69 %
PLATELETS: 181 10*3/uL (ref 150–400)
RBC: 2.41 MIL/uL — AB (ref 3.87–5.11)
RDW: 20 % — ABNORMAL HIGH (ref 11.5–15.5)
WBC: 9.8 10*3/uL (ref 4.0–10.5)

## 2016-10-21 LAB — GLUCOSE, CAPILLARY
GLUCOSE-CAPILLARY: 184 mg/dL — AB (ref 65–99)
GLUCOSE-CAPILLARY: 139 mg/dL — AB (ref 65–99)
Glucose-Capillary: 163 mg/dL — ABNORMAL HIGH (ref 65–99)
Glucose-Capillary: 157 mg/dL — ABNORMAL HIGH (ref 65–99)

## 2016-10-21 LAB — MAGNESIUM: MAGNESIUM: 1.5 mg/dL — AB (ref 1.7–2.4)

## 2016-10-21 LAB — PHOSPHORUS: Phosphorus: 3.1 mg/dL (ref 2.5–4.6)

## 2016-10-21 MED ORDER — METOPROLOL TARTRATE 5 MG/5ML IV SOLN
7.5000 mg | Freq: Once | INTRAVENOUS | Status: AC
  Administered 2016-10-21: 7.5 mg via INTRAVENOUS
  Filled 2016-10-21: qty 10

## 2016-10-21 MED ORDER — ENSURE ENLIVE PO LIQD: 237.0000 mL | Freq: Three times a day (TID) | ORAL | Status: DC

## 2016-10-21 MED ORDER — POTASSIUM CHLORIDE 10 MEQ/50ML IV SOLN
10.0000 meq | INTRAVENOUS | Status: AC
  Administered 2016-10-21 (×4): 10 meq via INTRAVENOUS
  Filled 2016-10-21 (×4): qty 50

## 2016-10-21 MED ORDER — PROMETHAZINE HCL 25 MG/ML IJ SOLN
12.5000 mg | INTRAMUSCULAR | Status: DC | PRN
  Administered 2016-10-21 – 2016-10-22 (×3): 12.5 mg via INTRAVENOUS
  Filled 2016-10-21 (×3): qty 1

## 2016-10-21 MED ORDER — TRACE MINERALS CR-CU-MN-SE-ZN 10-1000-500-60 MCG/ML IV SOLN
INTRAVENOUS | Status: AC
  Administered 2016-10-21: 18:00:00 via INTRAVENOUS
  Filled 2016-10-21: qty 1992

## 2016-10-21 MED ORDER — METOPROLOL TARTRATE 5 MG/5ML IV SOLN
10.0000 mg | Freq: Four times a day (QID) | INTRAVENOUS | Status: DC
  Administered 2016-10-21 – 2016-10-25 (×13): 10 mg via INTRAVENOUS
  Filled 2016-10-21 (×15): qty 10

## 2016-10-21 MED ORDER — MAGNESIUM SULFATE 4 GM/100ML IV SOLN
4.0000 g | Freq: Once | INTRAVENOUS | Status: AC
  Administered 2016-10-21: 4 g via INTRAVENOUS
  Filled 2016-10-21: qty 100

## 2016-10-21 NOTE — Progress Notes (Signed)
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 She presented with weakness and met sepsis criteria initially with concern for enteritis.  She grew a yeast UTI and on 8/18 1/2 blood cultures grew yeast and the patient was started on eraxis. She was switched to fluconazole and antibiotics were d/c'd on 8/20 with blood cultures only growing candida. ID recommended continuing fluconazole 400 mg daily through 9/26 and recommended ophtho exam within the next month.   She had an echo on 8/28 which showed new systolic dysfunction and her QT was markedly prolonged so she was changed back from fluconazole to Eraxis until 9/26. Cardiology was consulted and she was diuresed.   On 9/10 she developed new nausea and vomiting. KUB showed concern for a SBO. CT confirmed this and also showed evidence of possible enteritis. Repeat blood cultures obtained and started on ceftriaxone/flagyl.  Given severe malnutrition will need to consider TPN for Haley Graham and won't be able to place PICC until blood Cx are Negative x 3 days per ID reccs.   On 9/12 ECHO was repeated and was essentially the same; Deferring to Cardiology for further invasive procedures such as angiography and likely to be done early next if stable  Patient had Rapid Response on the night of 10/15/16 and ended up on BiPAP and being diuresed. She improved but continued to be delirious. This AM NGT was removed by Surgery and patient was placed on FULL Liquid  Diet. TPN was ordered for Patient but held due to evaluating if she could tolerate a FULL Diet and possibly TF through [place].  Another Rapid Response on 10/16/16 was called on the patient as patient was increasingly Tachypenic on BiPAP. I went to assess the patient and ordered a Stat CXR, ABG, and gave an additional 40 of IV Lasix. Had some flash pulmonary edema. Patient was maintaining saturations but still had a tenuous respiratory status so PCCM was called to evaluate and after evaluation patient improved and will remain in SDU with low threshold to transfer to ICU if she deteriorates. PCCM evaluated today and because patient was doing better they signed off the case.   9/15  patient was resting well, and per husband was the best she has rested in a while. Discussed plan of care with Surgery and were to place a Cortrak for TF however husband did not want to have it placed today and will hold off and re-assess Monday for TF.   9/16  patient was still slightly confused and was given another dose of IV Lasix. Possible Cortrak vs. TPN in AM if still is not eating significantly. Surgery is repeating Abdominal X-Ray in AM. Plan is for High Risk Diagnostic and Possible Interventional Cardiac Cath by Dr. Burt Knack on Tuesday.   Patient refused Cortrak 9/17 and will proceed with TPN.  Patient went for Cath today. It showed nonobstructive coronary disease. Patient complaining of abdominal pain nausea and vomiting since she had the cath.    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)   Dehydration   Generalized weakness   Anemia   Thrombocytopenia ([facility])    Abnormal computed tomography angiography (CTA) of abdomen and pelvis   Colitis: Probable   Protein-calorie malnutrition, severe   Palliative care by specialist   Adult failure to thrive   Hypophosphatemia   Hypomagnesemia   Depression   Acute systolic (congestive) heart failure ([place])   DNR (do not resuscitate) discussion   Congestive heart failure ([facility])   Heme positive stool   Cardiac LV ejection fraction 30-35%   QT prolongation   Acute hypercapnic respiratory failure ([place])   Metabolic encephalopathy   Pulmonary edema cardiac cause ([place])   Acute respiratory distress   Encephalopathy acute   Hypokalemia   Demand ischemia ([place])  Candidemia patient currently on Eraxis to be continued till 10/28/2016. Patient needs follow-up ophthalmologic exam after discharge. Patient could not tolerate fluconazole due to prolonged QT. Antifungal should be continued for 6 weeks. Repeat blood culture 2 shows no growth. A PICC was placed on 10/15/2016 4 TPN. Patient was not taking enough calories by mouth so  TPN was started. She refused feeding through NG tube.  Acute systolic CHF ejection fraction 35-40%. Currently Lasix being on hold due to nausea vomiting.  Small bowel obstruction by KUB follow-up KUB today.local ileus 10/18/16.    DVT prophylaxis: scds Code Status: Full code Family Communication: none Disposition Plan:  tbd  Consultants: Procedures:heart cath  Antimicrobials: eraxis   Subjective: Co abdominal pain nausea he Objective: Vitals:   10/21/16 0000 10/21/16 0357 10/21/16 0800 10/21/16 1640  BP: 106/87 (!) 146/83 (!) 174/78 (!) 210/88  Pulse:  93 93 94  Resp: '16 18 20   ' Temp: 98 F (36.7 C) 97.8 F (36.6 C)    TempSrc: Oral Axillary    SpO2: 99%  100%   Weight:  74 kg (163 lb 3.2 oz)    Height:        Intake/Output Summary (Last 24 hours) at 10/21/16 1659 Last data filed at 10/21/16 0821  Gross per 24 hour  Intake              682 ml  Output              450 ml   Net              232 ml   Filed Weights   10/14/16 0400 10/03/2016 0400 10/21/16 0357  Weight: 69.9 kg (154 lb 3.2 oz) 69.9 kg (154 lb 1.6 oz) 74 kg (163 lb 3.2 oz)    Examination:  General exam: Appears calm and comfortable  Respiratory system: Clear to auscultation. Respiratory effort normal. Cardiovascular system: S1 & S2 heard, RRR. No JVD, murmurs, rubs, gallops or clicks. No pedal edema. Gastrointestinal system: Abdomen is nondistended, soft and nontender. No organomegaly or masses felt. Normal bowel sounds heard. Central nervous system: Alert and oriented. No focal neurological deficits. Extremities: Symmetric 5 x 5 power. Skin: No rashes, lesions or ulcers Psychiatry: Judgement and insight appear normal. Mood & affect appropriate.     Data Reviewed: I have personally reviewed following labs and imaging studies  CBC:  Recent Labs Lab 10/16/16 0448 10/17/16 0505 10/18/16 0403 10/19/16 0410 10/19/2016 0408 10/21/16 0446  WBC 15.5*  14.3* 13.3* 10.4 9.9 9.8  NEUTROABS 12.8* 11.4*  --  8.2* 7.1 6.8  HGB 8.7* 8.0* 8.8* 7.7* 7.9* 7.8*  HCT 27.6* 25.6* 27.9* 23.6* 25.0* 24.0*  MCV 98.2 98.8 98.9 98.7 98.8 99.6  PLT 169 170 206 169 206 177   Basic Metabolic Panel:  Recent Labs Lab 10/16/16 0448 10/17/16 0505 10/18/16 0403 10/19/16 0410 10/03/2016 0408 10/21/16 0446  NA 139 142 142 141 136 134*  K 3.7 3.1* 3.4* 2.5* 3.6 3.6  CL 114* 113* 112* 109 108 103  CO2 21* '24 26 28 24 27  ' GLUCOSE 114* 97 112* 98 124* 114*  BUN '12 12 11 8 9 10  ' CREATININE 0.98 0.96 0.91 0.77 0.69 0.67  CALCIUM 7.4* 7.3* 7.5* 7.1* 7.4* 7.2*  MG 1.9 1.9 1.5* 1.6* 1.8 1.5*  PHOS 3.4 3.9  --  3.0 2.6 3.1   GFR: Estimated Creatinine Clearance: 72 mL/min (by C-G formula based on SCr of 0.67 mg/dL). Liver Function Tests:  Recent Labs Lab 10/17/16 0505 10/18/16 0403 10/19/16 0410 10/27/2016 0408 10/21/16 0446  AST '18 17 15 17 17  ' ALT 16 15 11* 9* 11*  ALKPHOS 511* 541* 436* 429* 365*    BILITOT 1.0 0.7 0.7 0.7 0.5  PROT 4.7* 5.1* 4.5* 4.2* 4.4*  ALBUMIN 1.3* 1.5* 1.3* 1.3* 1.3*   No results for input(s): LIPASE, AMYLASE in the last 168 hours. No results for input(s): AMMONIA in the last 168 hours. Coagulation Profile:  Recent Labs Lab 10/31/2016 1431  INR 1.06   Cardiac Enzymes:  Recent Labs Lab 10/15/16 0301 10/15/16 1912  TROPONINI 0.05* 0.07*   BNP (last 3 results) No results for input(s): PROBNP in the last 8760 hours. HbA1C: No results for input(s): HGBA1C in the last 72 hours. CBG:  Recent Labs Lab 10/03/2016 1901 10/16/2016 2114  2355 10/21/16 0844 10/21/16 1117  GLUCAP 147* 130* 142* 163* 184*   Lipid Profile:  Recent Labs  10/23/2016 0408  TRIG 337*   Thyroid Function Tests: No results for input(s): TSH, T4TOTAL, FREET4, T3FREE, THYROIDAB in the last 72 hours. Anemia Panel: No results for input(s): VITAMINB12, FOLATE, FERRITIN, TIBC, IRON, RETICCTPCT in the last 72 hours. Sepsis Labs:  Recent Labs Lab 10/15/16 0301  LATICACIDVEN 1.2    Recent Results (from the past 240 hour(s))  Culture, blood (routine x 2)     Status: None   Collection Time: 10/13/16  6:58 PM  Result Value Ref Range Status   Specimen Description BLOOD LEFT HAND  Final   Special Requests IN PEDIATRIC BOTTLE Blood Culture adequate volume  Final   Culture NO GROWTH 5 DAYS  Final   Report Status 10/18/2016 FINAL  Final  Culture, blood (routine x 2)     Status: None   Collection Time: 10/13/16  7:21 PM  Result Value Ref Range Status   Specimen Description BLOOD RIGHT HAND  Final   Special Requests IN PEDIATRIC BOTTLE Blood Culture adequate volume  Final   Culture NO GROWTH 5 DAYS  Final   Report Status 10/18/2016 FINAL  Final         Radiology Studies: No results found.      Scheduled Meds: . ALPRAZolam  0.25 mg Oral QHS  . aspirin EC  81 mg Oral Daily  . dronabinol  5 mg Oral BID AC  . feeding supplement  1 Container Oral Q24H  . feeding  supplement (ENSURE ENLIVE)  237 mL Oral TID BM  . insulin aspart  2-6  Units Subcutaneous Q6H  . lisdexamfetamine  20 mg Oral Daily  . metoprolol tartrate  7.5 mg Intravenous Q6H  . scopolamine  1 patch Transdermal Q72H  . sodium chloride flush  10-40 mL Intracatheter Q12H   Continuous Infusions: . Marland KitchenTPN (CLINIMIX-E) Adult 60 mL/hr at 10/23/2016 2038  . Marland KitchenTPN (CLINIMIX-E) Adult    . anidulafungin 100 mg (10/21/16 1554)  . cefTRIAXone (ROCEPHIN)  IV Stopped (10/21/16 1539)  . metronidazole Stopped (10/21/16 1608)     LOS: 3 days       Georgette Shell, MD [facility]  If 7PM-7AM, please contact night-coverage [email or web address] Password [email or web address] 10/21/2016, 4:59 PM

## 2016-10-21 NOTE — Progress Notes (Signed)
Patient resting comfortably on room air with no respiratory distress noted. BIPAP is not needed at this time. RT will monitor as needed. 

## 2016-10-21 NOTE — Progress Notes (Signed)
Pt refused to wear Bipap.Pt will continue to monitor.

## 2016-10-21 NOTE — Progress Notes (Signed)
Subjective No acute events. Cath yesterday was ok, nonobstructive CAD. Denies abdominal pain, nausea or emesis. Confused this AM.  Objective: Vital signs in last 24 hours: Temp:  [97.8 F (36.6 C)-98.6 F (37 C)] 97.8 F (36.6 C) (09/19 0357) Pulse Rate:  [0-97] 93 (09/19 0800) Resp:  [4-40] 20 (09/19 0800) BP: (106-178)/(57-88) 174/78 (09/19 0800) SpO2:  [81 %-100 %] 100 % (09/19 0800) Weight:  [74 kg (163 lb 3.2 oz)] 74 kg (163 lb 3.2 oz) (09/19 0357) Last BM Date: 10/17/16  Intake/Output from previous day: 09/18 0701 - 09/19 0700 In: 682 [I.V.:402; IV Piggyback:280] Out: -  Intake/Output this shift: Total I/O In: -  Out: 450 [Urine:450]  Gen: NAD, comfortable CV: RRR Pulm: Normal work of breathing Abd: Soft, NT/ND  Ext: SCDs in place  Lab Results: CBC   Recent Labs  11/01/2016 0408 10/21/16 0446  WBC 9.9 9.8  HGB 7.9* 7.8*  HCT 25.0* 24.0*  PLT 206 181   BMET  Recent Labs  10/18/2016 0408 10/21/16 0446  NA 136 134*  K 3.6 3.6  CL 108 103  CO2 24 27  GLUCOSE 124* 114*  BUN 9 10  CREATININE 0.69 0.67  CALCIUM 7.4* 7.2*   PT/INR  Recent Labs  10/11/2016 1431  LABPROT 13.7  INR 1.06   ABG No results for input(s): PHART, HCO3 in the last 72 hours.  Invalid input(s): PCO2, PO2  Studies/Results:  Anti-infectives: Anti-infectives    Start     Dose/Rate Route Frequency Ordered Stop   10/16/16 1400  cefTRIAXone (ROCEPHIN) 2 g in dextrose 5 % 50 mL IVPB     2 g 100 mL/hr over 30 Minutes Intravenous Every 24 hours 10/16/16 0810     10/14/16 1500  anidulafungin (ERAXIS) 100 mg in sodium chloride 0.9 % 100 mL IVPB     100 mg 78 mL/hr over 100 Minutes Intravenous Every 24 hours 10/13/16 1018 11/25/16 1459   10/13/16 1100  cefTRIAXone (ROCEPHIN) 2 g in dextrose 5 % 50 mL IVPB - Premix  Status:  Discontinued     2 g 100 mL/hr over 30 Minutes Intravenous Every 24 hours 10/13/16 1016 10/16/16 0810   10/13/16 1100  metroNIDAZOLE (FLAGYL) IVPB 500 mg     500 mg 100 mL/hr over 60 Minutes Intravenous Every 8 hours 10/13/16 1016     10/13/16 1030  anidulafungin (ERAXIS) 200 mg in sodium chloride 0.9 % 200 mL IVPB     200 mg 78 mL/hr over 200 Minutes Intravenous  Once 10/13/16 1018 10/13/16 2052   09/27/16 2200  erythromycin 250 mg in sodium chloride 0.9 % 100 mL IVPB  Status:  Discontinued     250 mg 100 mL/hr over 60 Minutes Intravenous Every 8 hours 09/27/16 1842 09/27/16 1959   09/27/16 2200  erythromycin (EES) 400 MG/5ML suspension 250 mg  Status:  Discontinued     250 mg Oral Every 8 hours 09/27/16 1959 09/29/16 1147   09/24/16 1300  fluconazole (DIFLUCAN) tablet 400 mg  Status:  Discontinued     400 mg Oral Daily 09/24/16 1138 10/13/16 0953   09/21/16 1400  fluconazole (DIFLUCAN) IVPB 400 mg  Status:  Discontinued     400 mg 100 mL/hr over 120 Minutes Intravenous Every 24 hours 09/20/16 1220 09/20/16 1221   09/21/16 0000  anidulafungin (ERAXIS) 50 mg in sodium chloride 0.9 % 50 mL IVPB  Status:  Discontinued     50 mg 78 mL/hr over 50 Minutes Intravenous Every 24  hours 09/19/16 0345 09/19/16 0346   09/20/16 1400  fluconazole (DIFLUCAN) IVPB 800 mg  Status:  Discontinued     800 mg 200 mL/hr over 120 Minutes Intravenous  Once 09/20/16 1220 09/20/16 1221   09/20/16 1400  fluconazole (DIFLUCAN) IVPB 400 mg  Status:  Discontinued     400 mg 100 mL/hr over 120 Minutes Intravenous Every 24 hours 09/20/16 1221 09/24/16 1137   09/19/16 2200  anidulafungin (ERAXIS) 100 mg in sodium chloride 0.9 % 100 mL IVPB  Status:  Discontinued     100 mg 78 mL/hr over 100 Minutes Intravenous Every 24 hours 09/19/16 0345 09/20/16 1220   09/19/16 0400  anidulafungin (ERAXIS) 200 mg in sodium chloride 0.9 % 200 mL IVPB     200 mg 78 mL/hr over 200 Minutes Intravenous  Once 09/19/16 0345 09/19/16 0819   09/18/16 1800  fluconazole (DIFLUCAN) IVPB 100 mg  Status:  Discontinued     100 mg 50 mL/hr over 60 Minutes Intravenous Every 24 hours 09/18/16 1109  09/19/16 1937   09/17/16 1800  ceFEPIme (MAXIPIME) 2 g in dextrose 5 % 50 mL IVPB  Status:  Discontinued     2 g 100 mL/hr over 30 Minutes Intravenous Every 24 hours 09/17/16 1243 09/21/16 1200   09/17/16 1800  fluconazole (DIFLUCAN) IVPB 200 mg  Status:  Discontinued     200 mg 100 mL/hr over 60 Minutes Intravenous Every 24 hours 09/17/16 1651 09/18/16 1109   09/12/16 1400  ceFEPIme (MAXIPIME) 1 g in dextrose 5 % 50 mL IVPB  Status:  Discontinued     1 g 100 mL/hr over 30 Minutes Intravenous Every 8 hours 09/12/16 1127 09/17/16 1243   09/11/16 1600  metroNIDAZOLE (FLAGYL) IVPB 500 mg  Status:  Discontinued     500 mg 100 mL/hr over 60 Minutes Intravenous Every 8 hours 09/11/16 0902 09/21/16 1200   09/11/16 1400  ceFEPIme (MAXIPIME) 1 g in dextrose 5 % 50 mL IVPB  Status:  Discontinued     1 g 100 mL/hr over 30 Minutes Intravenous Every 24 hours 09/11/16 1234 09/12/16 1127   09/10/16 2000  vancomycin (VANCOCIN) IVPB 1000 mg/200 mL premix  Status:  Discontinued     1,000 mg 200 mL/hr over 60 Minutes Intravenous Every 24 hours 09/07/2016 2130 09/10/16 1616   09/10/16 1800  vancomycin (VANCOCIN) IVPB 750 mg/150 ml premix  Status:  Discontinued     750 mg 150 mL/hr over 60 Minutes Intravenous Every 12 hours 09/10/16 1616 09/12/16 1145   09/10/16 1400  levofloxacin (LEVAQUIN) IVPB 500 mg  Status:  Discontinued     500 mg 100 mL/hr over 60 Minutes Intravenous Every 24 hours 09/08/2016 2112 09/11/16 1220   09/10/16 0115  vancomycin (VANCOCIN) IVPB 1000 mg/200 mL premix  Status:  Discontinued     1,000 mg 200 mL/hr over 60 Minutes Intravenous  Once 09/10/16 0110 09/10/16 0118   09/25/2016 2200  metroNIDAZOLE (FLAGYL) IVPB 500 mg  Status:  Discontinued     500 mg 100 mL/hr over 60 Minutes Intravenous Every 6 hours 09/17/2016 2110 09/11/16 0902   09/08/2016 2115  vancomycin (VANCOCIN) IVPB 1000 mg/200 mL premix     1,000 mg 200 mL/hr over 60 Minutes Intravenous  Once 09/04/2016 2108 09/26/2016 2323    09/08/2016 1430  levofloxacin (LEVAQUIN) IVPB 750 mg     750 mg 100 mL/hr over 90 Minutes Intravenous  Once 09/07/2016 1415 09/11/2016 2152   09/17/2016 1430  metroNIDAZOLE (FLAGYL) IVPB 500  mg     500 mg 100 mL/hr over 60 Minutes Intravenous  Once 09/04/2016 1415 09/20/2016 1850   09/22/2016 1430  levofloxacin (LEVAQUIN) IVPB 750 mg  Status:  Discontinued     750 mg 100 mL/hr over 90 Minutes Intravenous  Once 09/07/2016 1416 09/07/2016 1416       Assessment/Plan: Patient Active Problem List   Diagnosis Date Noted  . Demand ischemia ([place])   . Hypokalemia 10/19/2016  . Acute respiratory distress   . Encephalopathy acute   . Acute hypercapnic respiratory failure ([place])   . Metabolic encephalopathy   . Pulmonary edema cardiac cause ([place])   . QT prolongation 10/06/2016  . Cardiac LV ejection fraction 30-35%   . DNR (do not resuscitate) discussion   . Congestive heart failure ([place])   . Heme positive stool   . Acute systolic (congestive) heart failure ([place])   . Depression   . Candidemia ([place]) 09/21/2016  . Hypophosphatemia 09/21/2016  . Hypomagnesemia 09/21/2016  . Palliative care by specialist   . Adult failure to thrive   . Protein-calorie malnutrition, severe 09/12/2016  . Sepsis ([place]) 09/29/2016  . Hypotension 09/08/2016  . AKI (acute kidney injury) ([place]) 09/12/2016  . Dehydration 09/20/2016  . Generalized weakness 09/02/2016  . Anemia 10/02/2016  . Thrombocytopenia ([place]) 09/11/2016  . Abnormal computed tomography angiography (CTA) of abdomen and pelvis 09/23/2016  . Colitis: Probable 09/28/2016  . AAA (abdominal aortic aneurysm) ([place])   . Abdominal pain   . Ischemic enteritis ([place])   . Benign essential HTN   . Diabetes mellitus type 2 in obese ([place])   . Anxiety state   . Tachycardia   . Tachypnea   . Hyponatremia   . Leukocytosis   . Acute blood loss anemia   . 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 Unstable angina pectoris ([place])   . SOB (shortness of breath)   . 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 Change in stool 05/28/2016  . Gastroparesis 11/19/2011  . Nausea 11/19/2011  . Dysphagia 11/11/2011  . GERD (gastroesophageal reflux disease) 11/11/2011   Sepsis due to candidemia Splenic infarcts CHF Prolonged QTC Anemia Depression Respiratory failure/Acute pulmonary edema - improving CHF/cardiomyopathy - LAD ischemia. cath yesterday showed nonobstructive CAD, medical tx recommended Severe protein calorie malnutrition- cleared for FLD but patient not taking in many PO's. Refused cortrak.   SBO vs Ileus - CT scan 9/11 showed early or partial small bowel obstruction with transition zone in the right lower quadrant - having bowel function, tolerating small amount of PO's, does not currently have an enteral feeding tube, TPN running  FEN - IVF, FLD, TPN VTE - SCDs Foley - in place Follow up - TBD  Plan -Continue full liquids following cath; TPN since refused Cortrak. Albumin noted 1.3. -OOB to chaiar/ambulate -Replace electrolytes - K>4, PO4>3, Mg>2 -Minimize narcotics   LOS: 9 days   Clovis Riley, MD [facility]

## 2016-10-21 NOTE — Progress Notes (Signed)
Paged MD to notify them that pt has had increase in swelling in R arm where she has the picc. New orders have been placed. Will continue to monitor. Isac Caddy, RN

## 2016-10-21 NOTE — Progress Notes (Signed)
Pt c/o increasing upper abdominal pain and nausea.  MD paged.  Received orders to make pt NPO and for abdominal CT.

## 2016-10-21 NOTE — Progress Notes (Signed)
PHARMACY - ADULT TOTAL PARENTERAL NUTRITION CONSULT NOTE   Pharmacy Consult:  TPN Indication:  PCM  Patient Measurements: Height: 5' 3" (160 cm) Weight: 163 lb 3.2 oz (74 kg) IBW/kg (Calculated) : 52.4 TPN AdjBW (KG): 55.7 Body mass index is 28.91 kg/m.  Assessment:  36 YOF with PMH of type B aortic dissection, DM, gastroparesis, GERD, HTN, IBD who presented with enteritis.  Found to have fungal bacteremia with plans to be on fluconazole through 10/28/16.  On 10/12/16 patient developed N/V and KUB showed concern for SBO; confirmed by CT.  Patient is severely malnourished and family/patient is refusing cortrak placement for TF.  Pharmacy consulted to manage TPN for nutritional support.  GI: concern for ileus - Prealbumin 14.8, LBM 9/15 - dronabinol, Pepcid to be added to TPN Endo: no hx DM - CBGs < 150  Insulin requirements in the past 24 hours: 4 units Lytes: K 3.6 (KCL 70mq BID - received none, goal >/=4 for ileus), Mag 1.5 (goal >/= 2 for ileus), Na 134, others WNL Renal: SCr 0.67 stable, BUN WNL Pulm: flash pulm edema resolved - on 6L HFNC Cards: BP improving, HR controlled - ASA, IV metoprolol.  9/18 cath: nonobstructive CAD Hepatobil: alk phos down to 365, LFTs WNL.  TG elevated at 337 Neuro: A&O - Xanax, Vyvanse, scopolamine patch, PRN APAP ID: Eraxis/Flagyl/CTX for fungal bacteremia + IAA.  Afebrile, WBC WNL TPN Access: PICC placed 10/15/16 TPN start date: 10/19/16  Nutritional Goals (per RD recommendation on 9/12): KCal: 2000 - 2200 / day Protein: 110 - 120 gm / day  Current Nutrition:  Full liquid (minimal intake) Resource Breeze daily (received none) Ensure Enlive BID (received 1 yesterday) TPN   Plan:  - Increase Clinimix E 5/15 to 83 ml/hr.  Hold lipid for now given elevated TG.  Clinimix will provide 1414 kCal and 100gm protein per day, meeting 71% of kCal and 91% of protein needs.  PO intake/supplements to provide additional kCal and protein. - Daily  multivitamin and trace elements in TPN - D/C Pepcid BID, add 485mto TPN daily - Continue SSI Q6H.  D/C if CBGs remain controlled at goal TPN rate. - D/C PO KCL and order 4 runs - Mag sulfate 4gm IV x 1 - F/U TG on Friday.  Will need supplementation at least twice weekly to prevent EFAD.  - Consider hospice consult.  F/U placement of feeding tube when possible. - F/U AM labs   Aerion Bagdasarian D. DaMina MarblePharmD, BCPS Pager:  [phone]/19/2018, 7:53 AM

## 2016-10-21 NOTE — Progress Notes (Signed)
Spoke with Butch Penny in vascular u/s who said that they will not be able to get pt in for RUE scan today. Will be tomorrow.

## 2016-10-21 NOTE — Progress Notes (Signed)
Haley Graham, pt's cousin, called requesting information on the patient.  I let her know that I cannot share due to HIIPA.  She stated that Haley Graham had just called her and caused her concern because pt c/o being abandoned by her family.    Haley Graham claimed pt's spouse was keeping pt's side of family away from her.  She was very upset and crying and stated that if she "needed to bring the police" she would come see her cousin.  I strongly encouraged her to communicate with pt's spouse and daughter.  She asked that I not let them know that she called.

## 2016-10-22 ENCOUNTER — Inpatient Hospital Stay (HOSPITAL_COMMUNITY): Payer: BLUE CROSS/BLUE SHIELD

## 2016-10-22 DIAGNOSIS — R531 Weakness: Secondary | ICD-10-CM

## 2016-10-22 DIAGNOSIS — R441 Visual hallucinations: Secondary | ICD-10-CM

## 2016-10-22 DIAGNOSIS — K56609 Unspecified intestinal obstruction, unspecified as to partial versus complete obstruction: Secondary | ICD-10-CM

## 2016-10-22 LAB — COMPREHENSIVE METABOLIC PANEL
ALT: 10 U/L — AB (ref 14–54)
AST: 19 U/L (ref 15–41)
Albumin: 1.3 g/dL — ABNORMAL LOW (ref 3.5–5.0)
Alkaline Phosphatase: 414 U/L — ABNORMAL HIGH (ref 38–126)
Anion gap: 5 (ref 5–15)
BUN: 12 mg/dL (ref 6–20)
CHLORIDE: 102 mmol/L (ref 101–111)
CO2: 26 mmol/L (ref 22–32)
CREATININE: 0.6 mg/dL (ref 0.44–1.00)
Calcium: 7.1 mg/dL — ABNORMAL LOW (ref 8.9–10.3)
GFR calc non Af Amer: >60 mL/min (ref >60)
Glucose, Bld: 159 mg/dL — ABNORMAL HIGH (ref 65–99)
POTASSIUM: 4 mmol/L (ref 3.5–5.1)
SODIUM: 133 mmol/L — AB (ref 135–145)
Total Bilirubin: 0.4 mg/dL (ref 0.3–1.2)
Total Protein: 4.4 g/dL — ABNORMAL LOW (ref 6.5–8.1)

## 2016-10-22 LAB — CBC
HCT: 24.5 % — ABNORMAL LOW (ref 36.0–46.0)
Hemoglobin: 7.8 g/dL — ABNORMAL LOW (ref 12.0–15.0)
MCH: 31.7 pg (ref 26.0–34.0)
MCHC: 31.8 g/dL (ref 30.0–36.0)
MCV: 99.6 fL (ref 78.0–100.0)
PLATELETS: 138 10*3/uL — AB (ref 150–400)
RBC: 2.46 MIL/uL — ABNORMAL LOW (ref 3.87–5.11)
RDW: 20.7 % — ABNORMAL HIGH (ref 11.5–15.5)
WBC: 10 10*3/uL (ref 4.0–10.5)

## 2016-10-22 LAB — GLUCOSE, CAPILLARY
GLUCOSE-CAPILLARY: 127 mg/dL — AB (ref 65–99)
GLUCOSE-CAPILLARY: 166 mg/dL — AB (ref 65–99)
GLUCOSE-CAPILLARY: 172 mg/dL — AB (ref 65–99)
GLUCOSE-CAPILLARY: 184 mg/dL — AB (ref 65–99)
Glucose-Capillary: 196 mg/dL — ABNORMAL HIGH (ref 65–99)

## 2016-10-22 LAB — MAGNESIUM: MAGNESIUM: 1.8 mg/dL (ref 1.7–2.4)

## 2016-10-22 LAB — PHOSPHORUS: Phosphorus: 2.7 mg/dL (ref 2.5–4.6)

## 2016-10-22 MED ORDER — MAGNESIUM SULFATE 4 GM/100ML IV SOLN
4.0000 g | Freq: Once | INTRAVENOUS | Status: AC
  Administered 2016-10-22: 4 g via INTRAVENOUS
  Filled 2016-10-22: qty 100

## 2016-10-22 MED ORDER — ACETAMINOPHEN 650 MG RE SUPP
650.0000 mg | RECTAL | Status: DC | PRN
  Administered 2016-10-22 – 2016-10-23 (×2): 650 mg via RECTAL
  Filled 2016-10-22 (×2): qty 1

## 2016-10-22 MED ORDER — HYDRALAZINE HCL 20 MG/ML IJ SOLN: 10.0000 mg | Freq: Four times a day (QID) | INTRAMUSCULAR | Status: DC | PRN

## 2016-10-22 MED ORDER — TRACE MINERALS CR-CU-MN-SE-ZN 10-1000-500-60 MCG/ML IV SOLN
INTRAVENOUS | Status: AC
  Administered 2016-10-22: 18:00:00 via INTRAVENOUS
  Filled 2016-10-22: qty 1992

## 2016-10-22 MED ORDER — HYOSCYAMINE SULFATE 0.5 MG/ML IJ SOLN
0.2500 mg | INTRAMUSCULAR | Status: AC | PRN
  Administered 2016-10-22 – 2016-10-23 (×2): 0.25 mg via INTRAVENOUS
  Filled 2016-10-22 (×4): qty 0.5

## 2016-10-22 MED ORDER — HYDRALAZINE HCL 20 MG/ML IJ SOLN
10.0000 mg | Freq: Four times a day (QID) | INTRAMUSCULAR | Status: DC | PRN
  Administered 2016-10-23 – 2016-10-24 (×2): 10 mg via INTRAVENOUS
  Filled 2016-10-22 (×3): qty 1

## 2016-10-22 MED ORDER — IOPAMIDOL (ISOVUE-300) INJECTION 61%
100.0000 mL | Freq: Once | INTRAVENOUS | Status: AC | PRN
  Administered 2016-10-22: 100 mL via INTRAVENOUS

## 2016-10-22 NOTE — Progress Notes (Addendum)
Per AM RN shift report patient with nausea over night requiring medication for nausea, and patient + for BM this AM, and NPO, upon initial assessment patient is  in pain this AM,  patient at this time does not complain of nausea.  Patient with slight guarding to Abdomen and hypoactive bowl sounds. Dr. Zigmund Daniel in to see patient. Stated to Keep patient NPO. Margie Billet [facility] with surgery updated on patient. Stated team would be by to see patient. Will continue to monitor. Mitsuo Budnick, Bettina Gavia rN

## 2016-10-22 NOTE — Progress Notes (Deleted)
Patient ID: Haley Graham, female   DOB: 1955-05-13, 61 y.o.   MRN: [id number]  Patient has been tolerating small amounts of diet, having bowel function, and abdominal pain resolved. Recommend continuing full liquids and TPN while needed to keep caloric intake up. If she starts taking in more PO's ok to advance to soft diet as tolerated. General surgery will sign off, please call with concerns.  Prajna Vanderpool A Shonta Bourque

## 2016-10-22 NOTE — Progress Notes (Signed)
*  PRELIMINARY RESULTS* Vascular Ultrasound Bilateral upper extremity venous duplex has been completed.  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 Superficial vein thrombosis is noted in the right basilic vein surrounding the PICC.  Chronic deep vein thrombosis involving the mid portion of the left subclavian vein is noted as well as superficial vein thrombosis within the left basilic vein.  Preliminary results called to Dr. Zigmund Daniel @ 16:20 [place] 10/22/2016, 4:28 PM

## 2016-10-22 NOTE — Progress Notes (Signed)
Physical Therapy Treatment Patient Details Name: Haley Graham MRN: [id number] DOB: 1955/05/14 Today's Date: 10/22/2016    History of Present Illness This 61 y.o. female admitted with sepsis, hypotension, dehydration, amemia, COPD, AKI due to poor oral intake and hypotension/dehydration.  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 Pt c/o abdominal and back pain throughout session however willing to participate. Limited by elevated BP (see general comments below). Continue to progress as tolerated.    Follow Up Recommendations  SNF     Equipment Recommendations  None recommended by PT    Recommendations for Other Services       Precautions / Restrictions Precautions Precautions: Fall Restrictions Weight Bearing Restrictions: No    Mobility  Bed Mobility Overal bed mobility: Needs Assistance Bed Mobility: Supine to Sit;Sit to Supine     Supine to sit: Mod assist;+2 for physical assistance;HOB elevated Sit to supine: Mod assist;+2 for physical assistance   General bed mobility comments: assist to elevate trunk into sitting and to scoot hips toward EOB; cues for sequencing and technique  Transfers                 General transfer comment: deferred due to BP  Ambulation/Gait                 Stairs            Wheelchair Mobility    Modified Rankin (Stroke Patients Only)       Balance Overall balance assessment: Needs assistance Sitting-balance support: Feet supported;Bilateral upper extremity supported Sitting balance-Leahy Scale: Fair Sitting balance - Comments: min guard/min A to maintain sitting balance                                    Cognition Arousal/Alertness: Awake/alert Behavior During Therapy: Restless Overall  Cognitive Status: Impaired/Different from baseline Area of Impairment: Following commands;Problem solving;Orientation;Attention;Safety/judgement;Awareness;Memory                 Orientation Level: Disoriented to;Person;Place;Time;Situation Current Attention Level: Focused Memory: Decreased short-term memory Following Commands: Follows one step commands with increased time;Follows one step commands inconsistently Safety/Judgement: Decreased awareness of safety;Decreased awareness of deficits Awareness: Intellectual Problem Solving: Requires verbal cues;Difficulty sequencing;Requires tactile cues;Decreased initiation        Exercises      General Comments General comments (skin integrity, edema, etc.): BP supine prior to mobilizing 172/98 (115), in sitting 188/120 (138), and 174//95 (119) in supine end of session      Pertinent Vitals/Pain Pain Assessment: Faces Faces Pain Scale: Hurts even more Pain Location: abdomen and back Pain Descriptors / Indicators: Grimacing;Guarding;Moaning;Cramping Pain Intervention(s): Limited activity within patient's tolerance;Monitored during session;Repositioned    Home Living                      Prior Function            PT Goals (current goals can now be found in the care plan section) Acute Rehab PT Goals PT Goal Formulation: With patient/family Time For Goal Achievement: 11/02/16 Potential to Achieve Goals: Fair Progress towards PT goals: Not progressing toward goals - comment    Frequency    Min 2X/week      PT  Plan Current plan remains appropriate    Co-evaluation              AM-PAC PT "6 Clicks" Daily Activity  Outcome Measure  Difficulty turning over in bed (including adjusting bedclothes, sheets and blankets)?: Unable Difficulty moving from lying on back to sitting on the side of the bed? : Unable Difficulty sitting down on and standing up from a chair with arms (e.g., wheelchair, bedside commode,  etc,.)?: Unable Help needed moving to and from a bed to chair (including a wheelchair)?: Total Help needed walking in hospital room?: Total Help needed climbing 3-5 steps with a railing? : Total 6 Click Score: 6    End of Session   Activity Tolerance: Treatment limited secondary to medical complications (Comment) (elevated BP) Patient left: with call bell/phone within reach;in bed;with bed alarm set Nurse Communication: Mobility status;Other (comment) (BP) PT Visit Diagnosis: Muscle weakness (generalized) (M62.81);Other abnormalities of gait and mobility (R26.89);Unsteadiness on feet (R26.81);Adult, failure to thrive (R62.7)     Time: 0932-1000 PT Time Calculation (min) (ACUTE ONLY): 28 min  Charges:  $Therapeutic Activity: 23-37 mins                    G Codes:       Earney Navy, PTA Pager: [phone]     Darliss Cheney 10/22/2016, 11:28 AM

## 2016-10-22 NOTE — Progress Notes (Signed)
Patient ID: Haley Graham, female   DOB: [birth year], 61 y.o.   MRN: [id number]  [facility] Surgery Progress Note  2 Days Post-Op  Subjective: CC- abdominal pain Patient reports that she began having increased abdominal pain last night associated with nausea. No emesis. Pain worse with PO intake. Denies increased abdominal distension. She did have a BM this morning. Denies passing any flatus today.  Objective: Vital signs in last 24 hours: Temp:  [97.4 F (36.3 C)-98.5 F (36.9 C)] 97.4 F (36.3 C) (09/20 0358) Pulse Rate:  [94] 94 (09/19 1640) Resp:  [19-32] 32 (09/20 0800) BP: (159-210)/(83-106) 185/106 (09/20 0800) SpO2:  [97 %-100 %] 97 % (09/20 0600) Weight:  [161 lb 6.4 oz (73.2 kg)] 161 lb 6.4 oz (73.2 kg) (09/20 0530) Last BM Date: 10/22/16  Intake/Output from previous day: 09/19 0701 - 09/20 0700 In: 1258.3 [I.V.:1058.3; IV Piggyback:200] Out: 1175 [Urine:1175] Intake/Output this shift: No intake/output data recorded.  PE: Gen: Alert, NAD, pleasant HEENT: EOM's intact, pupils equal and round Card: RRR Pulm: CTAB, no W/R/R, effort normal Abd: Soft, mild distension, few but +BS in all 4 quadrants, no HSM, no hernia, diffusely tender especially in the lower abdomen/still painful but less when distracted. No rebound, some guarding. Psych: A&Ox3  Skin: no rashes noted, warm and dry   Lab Results:   Recent Labs  10/15/2016 0408 10/21/16 0446  WBC 9.9 9.8  HGB 7.9* 7.8*  HCT 25.0* 24.0*  PLT 206 181   BMET  Recent Labs  10/21/16 0446 10/22/16 0427  NA 134* 133*  K 3.6 4.0  CL 103 102  CO2 27 26  GLUCOSE 114* 159*  BUN 10 12  CREATININE 0.67 0.60  CALCIUM 7.2* 7.1*   PT/INR  Recent Labs  10/05/2016 1431  LABPROT 13.7  INR 1.06   CMP     Component Value Date/Time   NA 133 (L) 10/22/2016 0427   K 4.0 10/22/2016 0427   CL 102 10/22/2016 0427   CO2 26 10/22/2016 0427   GLUCOSE 159 (H) 10/22/2016 0427   BUN 12 10/22/2016 0427   CREATININE 0.60 10/22/2016 0427   CALCIUM 7.1 (L) 10/22/2016 0427   PROT 4.4 (L) 10/22/2016 0427   ALBUMIN 1.3 (L) 10/22/2016 0427   AST 19 10/22/2016 0427   ALT 10 (L) 10/22/2016 0427   ALKPHOS 414 (H) 10/22/2016 0427   BILITOT 0.4 10/22/2016 0427   GFRNONAA >60 10/22/2016 0427   GFRAA >60 10/22/2016 0427   Lipase     Component Value Date/Time   LIPASE 14 09/27/2016 1139       Studies/Results: Dg Abd Portable 1v  Result Date: 10/21/2016 CLINICAL DATA:  Abdominal pain EXAM: PORTABLE ABDOMEN - 1 VIEW COMPARISON:  10/18/2016 FINDINGS: Dilated loops of small bowel in the left mid abdomen, suggesting small bowel obstruction. Adynamic small bowel ileus is also possible. Cholecystectomy clips. Thoracic aortic stent, incompletely visualized. IMPRESSION: Dilated loops of small bowel in the left mid abdomen, suggesting small bowel obstruction. Adynamic small bowel ileus is also possible. Electronically Signed   By: Julian Hy M.D.   On: 10/21/2016 19:51    Anti-infectives: Anti-infectives    Start     Dose/Rate Route Frequency Ordered Stop   10/16/16 1400  cefTRIAXone (ROCEPHIN) 2 g in dextrose 5 % 50 mL IVPB     2 g 100 mL/hr over 30 Minutes Intravenous Every 24 hours 10/16/16 0810     10/14/16 1500  anidulafungin (ERAXIS) 100 mg in sodium chloride  0.9 % 100 mL IVPB     100 mg 78 mL/hr over 100 Minutes Intravenous Every 24 hours 10/13/16 1018 11/25/16 1459   10/13/16 1100  cefTRIAXone (ROCEPHIN) 2 g in dextrose 5 % 50 mL IVPB - Premix  Status:  Discontinued     2 g 100 mL/hr over 30 Minutes Intravenous Every 24 hours 10/13/16 1016 10/16/16 0810   10/13/16 1100  metroNIDAZOLE (FLAGYL) IVPB 500 mg     500 mg 100 mL/hr over 60 Minutes Intravenous Every 8 hours 10/13/16 1016     10/13/16 1030  anidulafungin (ERAXIS) 200 mg in sodium chloride 0.9 % 200 mL IVPB     200 mg 78 mL/hr over 200 Minutes Intravenous  Once 10/13/16 1018 10/13/16 2052   09/27/16 2200  erythromycin 250 mg  in sodium chloride 0.9 % 100 mL IVPB  Status:  Discontinued     250 mg 100 mL/hr over 60 Minutes Intravenous Every 8 hours 09/27/16 1842 09/27/16 1959   09/27/16 2200  erythromycin (EES) 400 MG/5ML suspension 250 mg  Status:  Discontinued     250 mg Oral Every 8 hours 09/27/16 1959 09/29/16 1147   09/24/16 1300  fluconazole (DIFLUCAN) tablet 400 mg  Status:  Discontinued     400 mg Oral Daily 09/24/16 1138 10/13/16 0953   09/21/16 1400  fluconazole (DIFLUCAN) IVPB 400 mg  Status:  Discontinued     400 mg 100 mL/hr over 120 Minutes Intravenous Every 24 hours 09/20/16 1220 09/20/16 1221   09/21/16 0000  anidulafungin (ERAXIS) 50 mg in sodium chloride 0.9 % 50 mL IVPB  Status:  Discontinued     50 mg 78 mL/hr over 50 Minutes Intravenous Every 24 hours 09/19/16 0345 09/19/16 0346   09/20/16 1400  fluconazole (DIFLUCAN) IVPB 800 mg  Status:  Discontinued     800 mg 200 mL/hr over 120 Minutes Intravenous  Once 09/20/16 1220 09/20/16 1221   09/20/16 1400  fluconazole (DIFLUCAN) IVPB 400 mg  Status:  Discontinued     400 mg 100 mL/hr over 120 Minutes Intravenous Every 24 hours 09/20/16 1221 09/24/16 1137   09/19/16 2200  anidulafungin (ERAXIS) 100 mg in sodium chloride 0.9 % 100 mL IVPB  Status:  Discontinued     100 mg 78 mL/hr over 100 Minutes Intravenous Every 24 hours 09/19/16 0345 09/20/16 1220   09/19/16 0400  anidulafungin (ERAXIS) 200 mg in sodium chloride 0.9 % 200 mL IVPB     200 mg 78 mL/hr over 200 Minutes Intravenous  Once 09/19/16 0345 09/19/16 0819   09/18/16 1800  fluconazole (DIFLUCAN) IVPB 100 mg  Status:  Discontinued     100 mg 50 mL/hr over 60 Minutes Intravenous Every 24 hours 09/18/16 1109 09/19/16 1937   09/17/16 1800  ceFEPIme (MAXIPIME) 2 g in dextrose 5 % 50 mL IVPB  Status:  Discontinued     2 g 100 mL/hr over 30 Minutes Intravenous Every 24 hours 09/17/16 1243 09/21/16 1200   09/17/16 1800  fluconazole (DIFLUCAN) IVPB 200 mg  Status:  Discontinued     200 mg 100  mL/hr over 60 Minutes Intravenous Every 24 hours 09/17/16 1651 09/18/16 1109   09/12/16 1400  ceFEPIme (MAXIPIME) 1 g in dextrose 5 % 50 mL IVPB  Status:  Discontinued     1 g 100 mL/hr over 30 Minutes Intravenous Every 8 hours 09/12/16 1127 09/17/16 1243   09/11/16 1600  metroNIDAZOLE (FLAGYL) IVPB 500 mg  Status:  Discontinued  500 mg 100 mL/hr over 60 Minutes Intravenous Every 8 hours 09/11/16 0902 09/21/16 1200   09/11/16 1400  ceFEPIme (MAXIPIME) 1 g in dextrose 5 % 50 mL IVPB  Status:  Discontinued     1 g 100 mL/hr over 30 Minutes Intravenous Every 24 hours 09/11/16 1234 09/12/16 1127   09/10/16 2000  vancomycin (VANCOCIN) IVPB 1000 mg/200 mL premix  Status:  Discontinued     1,000 mg 200 mL/hr over 60 Minutes Intravenous Every 24 hours 09/21/2016 2130 09/10/16 1616   09/10/16 1800  vancomycin (VANCOCIN) IVPB 750 mg/150 ml premix  Status:  Discontinued     750 mg 150 mL/hr over 60 Minutes Intravenous Every 12 hours 09/10/16 1616 09/12/16 1145   09/10/16 1400  levofloxacin (LEVAQUIN) IVPB 500 mg  Status:  Discontinued     500 mg 100 mL/hr over 60 Minutes Intravenous Every 24 hours 09/15/2016 2112 09/11/16 1220   09/10/16 0115  vancomycin (VANCOCIN) IVPB 1000 mg/200 mL premix  Status:  Discontinued     1,000 mg 200 mL/hr over 60 Minutes Intravenous  Once 09/10/16 0110 09/10/16 0118   09/30/2016 2200  metroNIDAZOLE (FLAGYL) IVPB 500 mg  Status:  Discontinued     500 mg 100 mL/hr over 60 Minutes Intravenous Every 6 hours 09/15/2016 2110 09/11/16 0902   09/15/2016 2115  vancomycin (VANCOCIN) IVPB 1000 mg/200 mL premix     1,000 mg 200 mL/hr over 60 Minutes Intravenous  Once 09/28/2016 2108 09/03/2016 2323   09/15/2016 1430  levofloxacin (LEVAQUIN) IVPB 750 mg     750 mg 100 mL/hr over 90 Minutes Intravenous  Once 09/22/2016 1415 09/21/2016 2152   09/21/2016 1430  metroNIDAZOLE (FLAGYL) IVPB 500 mg     500 mg 100 mL/hr over 60 Minutes Intravenous  Once 09/15/2016 1415 09/10/2016 1850   09/03/2016 1430   levofloxacin (LEVAQUIN) IVPB 750 mg  Status:  Discontinued     750 mg 100 mL/hr over 90 Minutes Intravenous  Once 09/24/2016 1416 09/18/2016 1416       Assessment/Plan Sepsis due to candidemia Splenic infarcts CHF Prolonged QTC Anemia Depression Respiratory failure/Acute pulmonary edema - improving CHF/cardiomyopathy - LAD ischemia. s/p cath 9/18, nonobstructive CAD Severe protein calorie malnutrition- on TPN  pSBO vs Ileus - CT scan 9/11 showed early or partial small bowel obstruction with transition zone in the right lower quadrant - having bowel function but abdominal pain and nausea worse today  FEN - NPO, TPN VTE - SCDs Foley - in place Follow up - TBD  Plan - Make patient NPO. Continue TPN. Check CBC. XR today shows persistent small bowel dilatation. Will order a CT scan of her abdomen/pelvis.   LOS: 33 days    Wellington Hampshire , [facility] Surgery 10/22/2016, 10:06 AM Pager: [phone] Consults: [phone] Mon-Fri 7:00 am-4:30 pm Sat-Sun 7:00 am-11:30 am

## 2016-10-22 NOTE — Progress Notes (Signed)
   Patient ID: Haley Graham, female   DOB: 1955/11/14, 61 y.o.   MRN: [id number]    This NP visited at bedside with patient who is currently working with PT.  [name] continues to experience visual and auditory hallucinations.   She speaks to seeing a little girl in the corner of her room and she hears her grandson in the hallway.  These hallucinations have been going on for several days, she has no insight into her current medical situation and is oriented only to person today.  Spoke with attending, Dr. Jacki Cones and am recommending to reconsult to  psychiatry to consider discontinuation of the Vyvanse and possible utilization of a low dose antipsychotic.    I have left messages for patient's husband in an attempt to offer emotional support and continued conversation regarding goals of care.   He has made it clear that the goal is that family is open to all offered and available medical interventions to prolong life.  He will touch base with me if he has questions or concerns.  Palliative medicine will continue to shadow for needs  Total time spent on the unit was 20 minutes   time in 8:30 time out 09:05     Greater than 50% of the time was spent in counseling and coordination of care.   Wadie Lessen NP  Palliative Medicine Team Team Phone # [phone] Pager [phone]

## 2016-10-22 NOTE — Progress Notes (Signed)
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 She presented with weakness and met sepsis criteria initially with concern for enteritis.  She grew a yeast UTI and on 8/18 1/2 blood cultures grew yeast and the patient was started on eraxis. She was switched to fluconazole and antibiotics were d/c'd on 8/20 with blood cultures only growing candida. ID recommended continuing fluconazole 400 mg daily through 9/26 and recommended ophtho exam within the next month.   She had an echo on 8/28 which showed new systolic dysfunction and her QT was markedly prolonged so she was changed back from fluconazole to Eraxis until 9/26. Cardiology was consulted and she was diuresed.   On 9/10 she developed new nausea and vomiting. KUB showed concern for a SBO. CT confirmed this and also showed evidence of possible enteritis. Repeat blood cultures obtained and started on ceftriaxone/flagyl.  Given severe malnutrition will need to consider TPN for Haley Graham and won't be able to place PICC until blood Cx are Negative x 3 days per ID reccs.   On 9/12 ECHO was repeated and was essentially the same; Deferring to Cardiology for further invasive procedures such as angiography and likely to be done early next if stable  Patient had Rapid Response on the night of 10/15/16 and ended up on BiPAP and being diuresed. She improved but continued to be delirious. This AM NGT was removed by Surgery and patient was placed on FULL Liquid  Diet. TPN was ordered for Patient but held due to evaluating if she could tolerate a FULL Diet and possibly TF through [place].  Another Rapid Response on 10/16/16 was called on the patient as patient was increasingly Tachypenic on BiPAP. I went to assess the patient and ordered a Stat CXR, ABG, and gave an additional 40 of IV Lasix. Had some flash pulmonary edema. Patient was maintaining saturations but still had a tenuous respiratory status so PCCM was called to evaluate and after evaluation patient improved . Cath showed no obstruction.kub done yesterday and today shows ileus vs obstruction.ngt placed today.dw surgery who ordered a CT scan of abdomen.   Assessment & Plan:   Principal Problem:   Candidemia ([place]) Active Problems:   GERD (gastroesophageal reflux disease)   Dissection of thoracoabdominal aorta (HCC)   AAA (abdominal aortic aneurysm) (HCC)   Benign essential HTN   Diabetes mellitus type 2 in obese (HCC)   Anxiety state   Hyponatremia   Leukocytosis   Sepsis (HCC)   Hypotension   AKI (acute kidney injury) ([place])   Dehydration   Generalized weakness   Anemia   Thrombocytopenia (HCC)   Abnormal computed tomography angiography (CTA) of abdomen and pelvis   Colitis: Probable   Protein-calorie malnutrition, severe   Palliative care by specialist   Adult failure to thrive   Hypophosphatemia   Hypomagnesemia   Depression   Acute systolic (congestive) heart failure ([place])   DNR (do not resuscitate) discussion   Congestive heart failure (HCC)   Heme positive stool   Cardiac LV ejection fraction 30-35%   QT prolongation   Acute hypercapnic respiratory failure ([place])  Metabolic encephalopathy   Pulmonary edema cardiac cause (HCC)   Acute respiratory distress   Encephalopathy acute   Hypokalemia   Demand ischemia (HCC)   SBO (small bowel obstruction) ([place])   Visual hallucination  Candidemia patient currently on Eraxis to be continued till 10/28/2016. Patient needs  follow-up ophthalmologic exam after discharge. Patient could not tolerate fluconazole due to prolonged QT. Antifungal should be continued for 6 weeks. Repeat blood culture 2 shows no growth. A PICC was placed on 10/15/2016 4 TPN. Patient was not taking enough calories by mouth so  TPN was started.   Acute systolic CHF ejection fraction 35-40%. Currently Lasix being on hold due to nausea vomiting.  Small bowel obstruction by KUB follow-up KUB today.local ileus 10/18/16.ngt placed yesterday.dw surgery.ct abdomen to be done today.i had long dw daughter who wants everything to be done for the patient.they have picked a SNF for patietnt to go once discharged.   DVT prophylaxis: Code Status: full Family Communication: daughter Disposition Plan: tbd   Consultants: surgery,id  Procedures: picc  Antimicrobials: arexis   Subjective:co abdominal pain   Objective: Vitals:   10/22/16 1000 10/22/16 1124 10/22/16 1200 10/22/16 1415  BP: (!) 174/95 (!) 172/98 (!) 163/96 (!) 150/80  Pulse:  (!) 107    Resp: (!) 27 (!) 21 (!) 26 18  Temp:      TempSrc:      SpO2:  98%  98%  Weight:      Height:        Intake/Output Summary (Last 24 hours) at 10/22/16 1611 Last data filed at 10/22/16 1528  Gross per 24 hour  Intake          2212.17 ml  Output              725 ml  Net          1487.17 ml   Filed Weights   10/06/2016 0400 10/21/16 0357 10/22/16 0530  Weight: 69.9 kg (154 lb 1.6 oz) 74 kg (163 lb 3.2 oz) 73.2 kg (161 lb 6.4 oz)    Examination:  General exam: Appears calm and comfortable  Respiratory system: Clear to auscultation. Respiratory effort normal. Cardiovascular system: S1 & S2 heard, RRR. No JVD, murmurs, rubs, gallops or clicks. No pedal edema. Gastrointestinal system: Abdomen distend,tender.diminished  bowel sounds heard. Central nervous system: Alert and oriented. No focal neurological deficits. Extremities: Symmetric 5 x 5 power. Skin: No rashes, lesions or  ulcers Psychiatry: Judgement and insight appear normal. Mood & affect appropriate.     Data Reviewed: I have personally reviewed following labs and imaging studies  CBC:  Recent Labs Lab 10/16/16 0448 10/17/16 0505 10/18/16 0403 10/19/16 0410 10/10/2016 0408 10/21/16 0446 10/22/16 1501  WBC 15.5* 14.3* 13.3* 10.4 9.9 9.8 10.0  NEUTROABS 12.8* 11.4*  --  8.2* 7.1 6.8  --   HGB 8.7* 8.0* 8.8* 7.7* 7.9* 7.8* 7.8*  HCT 27.6* 25.6* 27.9* 23.6* 25.0* 24.0* 24.5*  MCV 98.2 98.8 98.9 98.7 98.8 99.6 99.6  PLT 169 170 206 169 206 181 748*   Basic Metabolic Panel:  Recent Labs Lab 10/17/16 0505 10/18/16 0403 10/19/16 0410 10/05/2016 0408 10/21/16 0446 10/22/16 0427  NA 142 142 141 136 134* 133*  K 3.1* 3.4* 2.5* 3.6 3.6 4.0  CL 113* 112* 109 108 103 102  CO2 '24 26 28 24 27 26  ' GLUCOSE 97 112* 98 124* 114* 159*  BUN '12 11 8 9 10 12  ' CREATININE 0.96 0.91 0.77 0.69  0.67 0.60  CALCIUM 7.3* 7.5* 7.1* 7.4* 7.2* 7.1*  MG 1.9 1.5* 1.6* 1.8 1.5* 1.8  PHOS 3.9  --  3.0 2.6 3.1 2.7   GFR: Estimated Creatinine Clearance: 71.7 mL/min (by C-G formula based on SCr of 0.6 mg/dL). Liver Function Tests:  Recent Labs Lab 10/18/16 0403 10/19/16 0410 10/31/2016 0408 10/21/16 0446 10/22/16 0427  AST '17 15 17 17 19  ' ALT 15 11* 9* 11* 10*  ALKPHOS 541* 436* 429* 365* 414*  BILITOT 0.7 0.7 0.7 0.5 0.4  PROT 5.1* 4.5* 4.2* 4.4* 4.4*  ALBUMIN 1.5* 1.3* 1.3* 1.3* 1.3*   No results for input(s): LIPASE, AMYLASE in the last 168 hours. No results for input(s): AMMONIA in the last 168 hours. Coagulation Profile:  Recent Labs Lab 10/19/2016 1431  INR 1.06   Cardiac Enzymes:  Recent Labs Lab 10/15/16 1912  TROPONINI 0.07*   BNP (last 3 results) No results for input(s): PROBNP in the last 8760 hours. HbA1C: No results for input(s): HGBA1C in the last 72 hours. CBG:  Recent Labs Lab 10/21/16 1728 10/21/16 2145 10/22/16 0059 10/22/16 0550 10/22/16 1111  GLUCAP 139* 157* 166* 184*  172*   Lipid Profile:  Recent Labs  10/13/2016 0408  TRIG 337*   Thyroid Function Tests: No results for input(s): TSH, T4TOTAL, FREET4, T3FREE, THYROIDAB in the last 72 hours. Anemia Panel: No results for input(s): VITAMINB12, FOLATE, FERRITIN, TIBC, IRON, RETICCTPCT in the last 72 hours. Sepsis Labs: No results for input(s): PROCALCITON, LATICACIDVEN in the last 168 hours.  Recent Results (from the past 240 hour(s))  Culture, blood (routine x 2)     Status: None   Collection Time: 10/13/16  6:58 PM  Result Value Ref Range Status   Specimen Description BLOOD LEFT HAND  Final   Special Requests IN PEDIATRIC BOTTLE Blood Culture adequate volume  Final   Culture NO GROWTH 5 DAYS  Final   Report Status 10/18/2016 FINAL  Final  Culture, blood (routine x 2)     Status: None   Collection Time: 10/13/16  7:21 PM  Result Value Ref Range Status   Specimen Description BLOOD RIGHT HAND  Final   Special Requests IN PEDIATRIC BOTTLE Blood Culture adequate volume  Final   Culture NO GROWTH 5 DAYS  Final   Report Status 10/18/2016 FINAL  Final         Radiology Studies: Dg Abd Portable 1v  Result Date: 10/22/2016 CLINICAL DATA:  Abdominal pain EXAM: PORTABLE ABDOMEN - 1 VIEW COMPARISON:  Yesterday FINDINGS: Diffuse gaseous distention of small bowel. Moderate gaseous distension of the distal stomach. No noted colonic distention. Haziness of the abdomen with bulging of the flanks and some centralization of small bowel loops. The same appearance was seen on CT scanogram 10/13/2016, when there was no significant ascites. IMPRESSION: No change in preferential small bowel dilatation. Appearance correlates with partial small bowel obstruction seen on 10/13/2016 abdominal CT. Electronically Signed   By: Monte Fantasia M.D.   On: 10/22/2016 10:56   Dg Abd Portable 1v  Result Date: 10/21/2016 CLINICAL DATA:  Abdominal pain EXAM: PORTABLE ABDOMEN - 1 VIEW COMPARISON:  10/18/2016 FINDINGS: Dilated  loops of small bowel in the left mid abdomen, suggesting small bowel obstruction. Adynamic small bowel ileus is also possible. Cholecystectomy clips. Thoracic aortic stent, incompletely visualized. IMPRESSION: Dilated loops of small bowel in the left mid abdomen, suggesting small bowel obstruction. Adynamic small bowel ileus is also possible. Electronically Signed   By: Julian Hy  M.D.   On: 10/21/2016 19:51        Scheduled Meds: . ALPRAZolam  0.25 mg Oral QHS  . aspirin EC  81 mg Oral Daily  . dronabinol  5 mg Oral BID AC  . feeding supplement  1 Container Oral Q24H  . feeding supplement (ENSURE ENLIVE)  237 mL Oral TID BM  . insulin aspart  2-6 Units Subcutaneous Q6H  . lisdexamfetamine  20 mg Oral Daily  . metoprolol tartrate  10 mg Intravenous Q6H  . scopolamine  1 patch Transdermal Q72H  . sodium chloride flush  10-40 mL Intracatheter Q12H   Continuous Infusions: . Marland KitchenTPN (CLINIMIX-E) Adult 83 mL/hr at 10/22/16 0623  . Marland KitchenTPN (CLINIMIX-E) Adult    . anidulafungin Stopped (10/21/16 1734)  . cefTRIAXone (ROCEPHIN)  IV 2 g (10/22/16 1528)  . metronidazole 500 mg (10/22/16 1528)     LOS: 68 days       Georgette Shell, MD [facility]  If 7PM-7AM, please contact night-coverage [email or web address] Password [email or web address] 10/22/2016, 4:11 PM

## 2016-10-22 NOTE — Progress Notes (Signed)
PHARMACY - ADULT TOTAL PARENTERAL NUTRITION CONSULT NOTE   Pharmacy Consult:  TPN Indication:  PCM  Patient Measurements: Height: '5\' 3"'  (160 cm) Weight: 161 lb 6.4 oz (73.2 kg) IBW/kg (Calculated) : 52.4 TPN AdjBW (KG): 55.7 Body mass index is 28.59 kg/m.  Assessment:  44 YOF with PMH of type B aortic dissection, DM, gastroparesis, GERD, HTN, IBD who presented with enteritis.  Found to have fungal bacteremia with plans to be on fluconazole through 10/28/16.  On 10/12/16 patient developed N/V and KUB showed concern for SBO; confirmed by CT.  Patient is severely malnourished and family/patient is refusing cortrak placement for TF.  Pharmacy consulted to manage TPN for nutritional support.  GI: concern for ileus - Prealbumin 14.8, LBM 9/20 - dronabinol, Pepcid to be added to TPN Endo: no hx DM - CBGs < 150  Insulin requirements in the past 24 hours: 8 units Lytes: K 4 (KCL 67mq BID - received none, goal >/=4 for ileus), Mag 1.8 (goal >/= 2 for ileus), Na 133, others WNL Renal: SCr 0.6 stable, BUN WNL Pulm: flash pulm edema resolved - on 6L HFNC Cards: BP improving, HR controlled - ASA, IV metoprolol.  9/18 cath: nonobstructive CAD Hepatobil: alk phos 365>414, LFTs WNL.  TG elevated at 337 (repeating on Fri) Neuro: A&O - Xanax, Vyvanse, scopolamine patch, PRN APAP ID: Eraxis/Flagyl/CTX for fungal bacteremia + IAA.  Afebrile, WBC WNL TPN Access: PICC placed 10/15/16 TPN start date: 10/19/16  Nutritional Goals (per RD recommendation on 9/12): KCal: 2000 - 2200 / day Protein: 110 - 120 gm / day  Current Nutrition:  Full liquid (minimal intake) Resource Breeze daily (received none) Ensure Enlive BID (received 1 yesterday) TPN  Plan:  - Continue Clinimix E 5/15 to 83 ml/hr.  Hold lipid for now given elevated TG.  Clinimix will provide 1414 kCal and 100gm protein per day, meeting 71% of kCal and 91% of protein needs.  PO intake/supplements to provide additional kCal and protein (though  NPO now d/t nausea) - Daily multivitamin and trace elements in TPN - D/C Pepcid BID, add 448mto TPN daily  - Continue SSI Q6H.  D/C if CBGs remain controlled at goal TPN rate. - Mag sulfate 4gm IV x 1 - F/U TG on Friday.  Will need supplementation at least twice weekly to prevent EFAD.  - K, Mg in am  MiLevester FreshPharmD, BCPS, BCCCP Clinical Pharmacist Clinical phone for 10/22/2016 from 7a-3:30p: [phone] after 3:30p, please call main pharmacy at: [phone] 10/22/2016 9:31 AM

## 2016-10-22 NOTE — Progress Notes (Signed)
Daughter/family with questions wanting to speak with doctor. Dr. Zigmund Daniel made aware and up to see family. Daughter wanted to wait until she spoke with MD for the NG tube to be placed. Will monitor patient. Lorian Yaun, Bettina Gavia rN

## 2016-10-23 ENCOUNTER — Inpatient Hospital Stay (HOSPITAL_COMMUNITY): Payer: BLUE CROSS/BLUE SHIELD

## 2016-10-23 LAB — CBC WITH DIFFERENTIAL/PLATELET
BASOS ABS: 0 10*3/uL (ref 0.0–0.1)
Basophils Relative: 0 %
Eosinophils Absolute: 0 10*3/uL (ref 0.0–0.7)
Eosinophils Relative: 1 %
HEMATOCRIT: 22.5 % — AB (ref 36.0–46.0)
HEMOGLOBIN: 7.1 g/dL — AB (ref 12.0–15.0)
LYMPHS PCT: 21 %
Lymphs Abs: 1.7 10*3/uL (ref 0.7–4.0)
MCH: 31.7 pg (ref 26.0–34.0)
MCHC: 31.6 g/dL (ref 30.0–36.0)
MCV: 100.4 fL — AB (ref 78.0–100.0)
MONO ABS: 0.9 10*3/uL (ref 0.1–1.0)
Monocytes Relative: 11 %
NEUTROS ABS: 5.5 10*3/uL (ref 1.7–7.7)
NEUTROS PCT: 67 %
Platelets: 99 10*3/uL — ABNORMAL LOW (ref 150–400)
RBC: 2.24 MIL/uL — ABNORMAL LOW (ref 3.87–5.11)
RDW: 20.8 % — ABNORMAL HIGH (ref 11.5–15.5)
WBC: 8.2 10*3/uL (ref 4.0–10.5)

## 2016-10-23 LAB — COMPREHENSIVE METABOLIC PANEL
ALK PHOS: 385 U/L — AB (ref 38–126)
ALT: 9 U/L — AB (ref 14–54)
AST: 15 U/L (ref 15–41)
Albumin: 1.3 g/dL — ABNORMAL LOW (ref 3.5–5.0)
Anion gap: 3 — ABNORMAL LOW (ref 5–15)
BILIRUBIN TOTAL: 0.5 mg/dL (ref 0.3–1.2)
BUN: 14 mg/dL (ref 6–20)
CALCIUM: 7.2 mg/dL — AB (ref 8.9–10.3)
CO2: 27 mmol/L (ref 22–32)
CREATININE: 0.53 mg/dL (ref 0.44–1.00)
Chloride: 103 mmol/L (ref 101–111)
GFR calc non Af Amer: >60 mL/min (ref >60)
Glucose, Bld: 150 mg/dL — ABNORMAL HIGH (ref 65–99)
Potassium: 3.4 mmol/L — ABNORMAL LOW (ref 3.5–5.1)
SODIUM: 133 mmol/L — AB (ref 135–145)
Total Protein: 4.2 g/dL — ABNORMAL LOW (ref 6.5–8.1)

## 2016-10-23 LAB — GLUCOSE, CAPILLARY
GLUCOSE-CAPILLARY: 168 mg/dL — AB (ref 65–99)
GLUCOSE-CAPILLARY: 173 mg/dL — AB (ref 65–99)
GLUCOSE-CAPILLARY: 210 mg/dL — AB (ref 65–99)
Glucose-Capillary: 179 mg/dL — ABNORMAL HIGH (ref 65–99)
Glucose-Capillary: 183 mg/dL — ABNORMAL HIGH (ref 65–99)
Glucose-Capillary: 202 mg/dL — ABNORMAL HIGH (ref 65–99)
Glucose-Capillary: 206 mg/dL — ABNORMAL HIGH (ref 65–99)

## 2016-10-23 LAB — MAGNESIUM: Magnesium: 1.9 mg/dL (ref 1.7–2.4)

## 2016-10-23 LAB — TRIGLYCERIDES: TRIGLYCERIDES: 277 mg/dL — AB (ref <150)

## 2016-10-23 LAB — HEPARIN LEVEL (UNFRACTIONATED): Heparin Unfractionated: 0.33 IU/mL (ref 0.30–0.70)

## 2016-10-23 MED ORDER — TRACE MINERALS CR-CU-MN-SE-ZN 10-1000-500-60 MCG/ML IV SOLN
INTRAVENOUS | Status: AC
  Administered 2016-10-23: 17:00:00 via INTRAVENOUS
  Filled 2016-10-23: qty 1992

## 2016-10-23 MED ORDER — HALOPERIDOL LACTATE 5 MG/ML IJ SOLN
2.0000 mg | Freq: Once | INTRAMUSCULAR | Status: AC
Start: 2016-10-23 — End: 2016-10-23
  Administered 2016-10-23: 2 mg via INTRAVENOUS
  Filled 2016-10-23: qty 1

## 2016-10-23 MED ORDER — POTASSIUM CHLORIDE 10 MEQ/50ML IV SOLN
10.0000 meq | INTRAVENOUS | Status: AC
  Administered 2016-10-23 (×5): 10 meq via INTRAVENOUS
  Filled 2016-10-23 (×5): qty 50

## 2016-10-23 MED ORDER — HEPARIN (PORCINE) IN NACL 100-0.45 UNIT/ML-% IJ SOLN
1000.0000 [IU]/h | INTRAMUSCULAR | Status: DC
  Administered 2016-10-23: 1000 [IU]/h via INTRAVENOUS
  Filled 2016-10-23: qty 250

## 2016-10-23 MED ORDER — HEPARIN (PORCINE) IN NACL 100-0.45 UNIT/ML-% IJ SOLN
1250.0000 [IU]/h | INTRAMUSCULAR | Status: DC
  Administered 2016-10-23: 1050 [IU]/h via INTRAVENOUS
  Administered 2016-10-24: 1250 [IU]/h via INTRAVENOUS
  Filled 2016-10-23: qty 250

## 2016-10-23 MED ORDER — HYOSCYAMINE SULFATE 0.5 MG/ML IJ SOLN
0.2500 mg | INTRAMUSCULAR | Status: AC | PRN
  Administered 2016-10-23 – 2016-10-24 (×2): 0.25 mg via INTRAVENOUS
  Filled 2016-10-23 (×3): qty 0.5

## 2016-10-23 MED ORDER — HEPARIN BOLUS VIA INFUSION
3000.0000 [IU] | Freq: Once | INTRAVENOUS | Status: AC
Start: 2016-10-23 — End: 2016-10-23
  Administered 2016-10-23: 3000 [IU] via INTRAVENOUS
  Filled 2016-10-23: qty 3000

## 2016-10-23 MED ORDER — MAGNESIUM SULFATE 2 GM/50ML IV SOLN
2.0000 g | Freq: Once | INTRAVENOUS | Status: AC
  Administered 2016-10-23: 2 g via INTRAVENOUS
  Filled 2016-10-23: qty 50

## 2016-10-23 MED ORDER — INSULIN ASPART 100 UNIT/ML ~~LOC~~ SOLN
0.0000 [IU] | SUBCUTANEOUS | Status: DC
  Administered 2016-10-23: 5 [IU] via SUBCUTANEOUS
  Administered 2016-10-23: 3 [IU] via SUBCUTANEOUS
  Administered 2016-10-23: 5 [IU] via SUBCUTANEOUS
  Administered 2016-10-23: 3 [IU] via SUBCUTANEOUS
  Administered 2016-10-24 (×2): 5 [IU] via SUBCUTANEOUS
  Administered 2016-10-24 – 2016-10-25 (×3): 3 [IU] via SUBCUTANEOUS

## 2016-10-23 NOTE — Progress Notes (Signed)
ANTICOAGULATION CONSULT NOTE - Initial Consult  Pharmacy Consult for Heparin Indication: DVT  Allergies  Allergen Reactions  . Penicillins Rash and Other (See Comments)    PATIENT HAS HAD A PCN REACTION WITH IMMEDIATE RASH, FACIAL/TONGUE/THROAT SWELLING, SOB, OR LIGHTHEADEDNESS WITH HYPOTENSION:  #  #  #  YES  #  #  #   Has patient had a PCN reaction causing severe rash involving mucus membranes or skin necrosis: no Has patient had a PCN reaction that required hospitalization no Has patient had a PCN reaction occurring within the last 10 years:no If all of the above answers are "NO", then may proceed with Cephalosporin use.  . Ativan [Lorazepam] Other (See Comments)    Makes pt very confused and more agitated, irritable.  . Codeine Nausea And Vomiting  . Reglan [Metoclopramide] Other (See Comments)    TACHYCARDIA    Patient Measurements: Height: 5\' 3"  (160 cm) Weight: 166 lb 14.4 oz (75.7 kg) IBW/kg (Calculated) : 52.4 Heparin Dosing Weight: 69 kg  Vital Signs: Temp: 97.9 F (36.6 C) (09/21 1148) Temp Source: Oral (09/21 1148) BP: 146/68 (09/21 1148) Pulse Rate: 93 (09/21 0530)  Labs:  Recent Labs  10/03/2016 1431  10/21/16 0446 10/22/16 0427 10/22/16 1501 10/23/16 0454  HGB  --   < > 7.8*  --  7.8* 7.1*  HCT  --   --  24.0*  --  24.5* 22.5*  PLT  --   --  181  --  138* 99*  LABPROT 13.7  --   --   --   --   --   INR 1.06  --   --   --   --   --   CREATININE  --   --  0.67 0.60  --  0.53  < > = values in this interval not displayed.  Estimated Creatinine Clearance: 72.8 mL/min (by C-G formula based on SCr of 0.53 mg/dL).  Assessment:   61 year old female with complicated hospital course to begin IV heparin for bilateral upper extremity DVTs per duplex scan.  Pharmacy is assisting with TPN, and prior     Hemoglobin and platelet count low. No bleeding reported.      Pharmacy is assisting with TPN, and prior OPAT consult (9/12). 6 weeks of IV antifungal therapy is  scheduled to be completed on 10/28/16.  Goal of Therapy:  Heparin level 0.3-0.7 units/ml Monitor platelets by anticoagulation protocol: Yes   Plan:   Conservative start with heparin due to low Hgb and platelet count.  Heparin 3000 units IV x 1.  Heparin drip to begin at 1000 units/hr (~14 units/kg/hr)  Heparin level ~6 hr after drip begins.  Daily heparin level and CBC.  Monitor for bleeding.    Arty Baumgartner, [place] Pager: [phone] 10/23/2016,2:16 PM

## 2016-10-23 NOTE — Progress Notes (Signed)
ANTICOAGULATION CONSULT NOTE - FOLLOW UP    HL = 0.33 (goal 0.3 - 0.7 units/mL) Heparin dosing weight = 69 kg   Assessment: 61 YOF continues on IV heparin for bilateral upper extremity DVTs.  Heparin level is therapeutic and toward the low end of goal.  Noted patient has low hemoglobin and platelet count.  No bleeding per RN.   Plan: Increase heparin gtt slightly to 1050 units/hr F/U AM labs   Nadeen Shipman D. [name], PharmD, BCPS 10/23/2016, 10:59 PM

## 2016-10-23 NOTE — Progress Notes (Signed)
PT is not in distress satting 100% RR 18 Bipap not indicated at this time. RT will continue to monitor

## 2016-10-23 NOTE — Progress Notes (Addendum)
PHARMACY - ADULT TOTAL PARENTERAL NUTRITION CONSULT NOTE   Pharmacy Consult:  TPN Indication:  PCM  Patient Measurements: Height: _0  (160 cm) Weight: 166 lb 14.4 oz (75.7 kg) IBW/kg (Calculated) : 52.4 TPN AdjBW (KG): 55.7 Body mass index is 29.57 kg/m.  Assessment:  36 YOF with PMH of type B aortic dissection, DM, gastroparesis, GERD, HTN, IBD who presented with enteritis.  Found to have fungal bacteremia with plans to be on fluconazole through 10/28/16.  On 10/12/16 patient developed N/V and KUB showed concern for SBO; confirmed by CT.  Patient is severely malnourished and family/patient is refusing cortrak placement for TF.  Pharmacy consulted to manage TPN for nutritional support.  GI: concern for ileus - LBM 9/20 - dronabinol, Pepcid to be added to TPN. Albumin low at 1.3. Preablumin low at 14.8. Endo: No hx DM. CBGs trending up to 150-180s. SSI Insulin requirements in the past 24 hours: 16 units Lytes: wnl exc K low at 3.4 (goal > 4 with ileus)  Mag 1.9 (goal >/= 2 for ileus) after replacement Renal: SCr stable, BUN WNL Pulm: flash pulm edema resolved - on 6L HFNC Cards: BP improving, HR controlled - ASA, IV metoprolol.  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  TG elevated but trending down to 277.  Neuro: A&O - Xanax, Vyvanse, scopolamine patch, PRN APAP ID: Haley Graham for fungal bacteremia + IAA. Was switched to Eraxis this admit. Afebrile, WBC WNL TPN Access: PICC placed 10/15/16 TPN start date: 10/19/16  Nutritional Goals (per RD recommendation on 9/12): KCal: 2000 - 2200 / day Protein: 110 - 120 gm / day  Current Nutrition:  NPO Resource Breeze daily (received none) Ensure Enlive BID (received none yesterday) TPN  Plan:  Continue Clinimix E 5/15 at 36m/hr Continue to hold IV lipid emulsions, will consider restarting on 9/24 with a MWF regimen TPN provides 100g of protein and 1414 kCals per day  meeting >90% of protein and >70% of kCal needs Add MVI and trace elements in TPN Add Pepcid 446mto TPN Increase to moderate SSI Q4h and adjust as needed Monitor TPN labs  Give Mg 2 g IV x 1  Give potassium 1012mof KCl x 5 runs  Encourage trial of TFs if able   [name], BCPS Clinical Pharmacist Pager [phone] 9:28 AM

## 2016-10-23 NOTE — Progress Notes (Signed)
Patient Refused Bipap. RT will continue to monitor

## 2016-10-23 NOTE — Progress Notes (Signed)
PROGRESS NOTE    Haley Graham  GYJ:[id number] DOB: 1955/06/16 DOA: 09/08/2016 PCP: Glenda Chroman, MD   Brief Narrative: Haley Haller Alversonis a 61 y.o.femalewith medical history significant of [place] type B aortic dissection status post descending thoracic aortic stent graft placement per vascular surgery during last hospitalization from 06/20/2016-08/22/2016, a history of diabetes mellitus with gastroparesis, gastroesophageal reflux disease, hypertension, IBS, abdominal aortic aneurysm, anxiety, depression and other comborids. She presented with weakness and met sepsis criteria initially with concern for enteritis.  She grew a yeast UTI and on 8/18 1/2 blood cultures grew yeast and the patient was started on eraxis. She was switched to fluconazole and antibiotics were d/c'd on 8/20 with blood cultures only growing candida. ID recommended continuing fluconazole 400 mg daily through 9/26 and recommended ophtho exam within the next month.   She had an echo on 8/28 which showed new systolic dysfunction and her QT was markedly prolonged so she was changed back from fluconazole to Eraxis until 9/26. Cardiology was consulted and she was diuresed.   On 9/10 she developed new nausea and vomiting. KUB showed concern for a SBO. CT confirmed this and also showed evidence of possible enteritis. Repeat blood cultures obtained and started on ceftriaxone/flagyl.  Given severe malnutrition will need to consider TPN for Haley Graham and won't be able to place PICC until blood Cx are Negative x 3 days per ID reccs.   On 9/12 ECHO was repeated and was essentially the same; Deferring to Cardiology for further invasive procedures such as angiography and likely to be done early next if stable  Patient had Rapid Response on the night of 10/15/16 and ended up on BiPAP and being diuresed. She improved but continued to be delirious. This AM NGT was removed by Surgery and patient was placed on FULL Liquid  Diet. TPN was ordered for Patient but held due to evaluating if she could tolerate a FULL Diet and possibly TF through [place].  Another Rapid Response on 10/16/16 was called on the patient as patient was increasingly Tachypenic on BiPAP. I went to assess the patient and ordered a Stat CXR, ABG, and gave an additional 40 of IV Lasix. Had some flash pulmonary edema. Patient was maintaining saturations but still had a tenuous respiratory status so PCCM was called to evaluate and after evaluation patient improved . Cath showed no obstruction.kub done yesterday and today shows ileus vs obstruction.ngt in place.CT scan abdomen with obstruction/ileus.   Assessment & Plan:   Candidemia patient currently on Eraxis to be continued till 10/28/2016. Patient needs follow-up ophthalmologic exam after discharge. Patient could not tolerate fluconazole due to prolonged QT. Antifungal should be continued for 6 weeks. Repeat blood culture 2 shows no growth.A PICC was placed on 10/15/2016 4 TPN. Patient was not taking enough calories by mouth so TPN was started.  Chronic DVT B/L upper extremity.will start heparin.  Hypokalemia being repleted in TPN.  Anemia of chronic disease- hb trending down.gauiac stools.  Acute systolic CHF ejection fraction 35-40%. Currently Lasix being on hold due to nausea vomiting.  Small bowel obstruction by KUB follow-up KUB today.local ileus 10/18/16.ngt placed yesterday.dw surgery.ct abdomen to be done yesterday.i had long dw daughter who wants everything to be done for the patient.they have picked a SNF for patietnt to go once discharged.     Assessment & Plan:   Principal Problem:   Candidemia ([place]) Active Problems:   GERD (gastroesophageal reflux disease)   Dissection of thoracoabdominal aorta ([place])  AAA (abdominal aortic aneurysm) ([facility])   Benign essential HTN   Diabetes mellitus type 2 in obese (H[facility])   Anxiety state   Hyponatremia   Leukocytosis   Sepsis  ([facility])   Hypotension   AKI (acute kidney injury) ([place])   Dehydration   Generalized weakness   Anemia   Thrombocytopenia ([facility])   Abnormal computed tomography angiography (CTA) of abdomen and pelvis   Colitis: Probable   Protein-calorie malnutrition, severe   Palliative care by specialist   Adult failure to thrive   Hypophosphatemia   Hypomagnesemia   Depression   Acute systolic (congestive) heart failure (HCC)   DNR (do not resuscitate) discussion   Congestive heart failure (HCC)   Heme positive stool   Cardiac LV ejection fraction 30-35%   QT prolongation   Acute hypercapnic respiratory failure (HCC)   Metabolic encephalopathy   Pulmonary edema cardiac cause (HCC)   Acute respiratory distress   Encephalopathy acute   Hypokalemia   Demand ischemia (HCC)   SBO (small bowel obstruction) ([place])   Visual hallucination    DVT prophylaxis:  Code Status:  Family Communication:  Disposition Plan:    Consult syrgery  Procedures: picc  Antimicrobials: arexis  Subjective:  Objective:co abdominal pain Vitals:   10/23/16 0530 10/23/16 0644 10/23/16 0800 10/23/16 1148  BP: 92/60 (!) 148/66 (!) 174/72 (!) 146/68  Pulse: 93     Resp: _0 Temp: (!) 97.5 F (36.4 C)  98 F (36.7 C) 97.9 F (36.6 C)  TempSrc: Oral  Oral Oral  SpO2: 99% 91% 97% 99%  Weight: 75.7 kg (166 lb 14.4 oz)     Height:        Intake/Output Summary (Last 24 hours) at 10/23/16 1324 Last data filed at 10/23/16 0801  Gross per 24 hour  Intake          2325.92 ml  Output             2305 ml  Net            20.92 ml   Filed Weights   10/21/16 0357 10/22/16 0530 10/23/16 0530  Weight: 74 kg (163 lb 3.2 oz) 73.2 kg (161 lb 6.4 oz) 75.7 kg (166 lb 14.4 oz)    Examination:  General exam:in mild distress Respiratory system: Clear to auscultation. Respiratory effort normal. Cardiovascular system: S1 & S2 heard, RRR. No JVD, murmurs, rubs, gallops or clicks. No pedal  edema. Gastrointestinal system: Abdomen is distended, soft and nontender. Diminished BS. Central nervous system: Alert and oriented. No focal neurological deficits. Extremities: Symmetric 5 x 5 power. Skin: No rashes, lesions or ulcers Psychiatry: Judgement and insight appear normal. Mood & affect appropriate.     Data Reviewed: I have personally reviewed following labs and imaging studies  CBC:  Recent Labs Lab 10/17/16 0505  10/19/16 0410 10/10/2016 0408 10/21/16 0446 10/22/16 1501 10/23/16 0454  WBC 14.3*  < > 10.4 9.9 9.8 10.0 8.2  NEUTROABS 11.4*  --  8.2* 7.1 6.8  --  5.5  HGB 8.0*  < > 7.7* 7.9* 7.8* 7.8* 7.1*  HCT 25.6*  < > 23.6* 25.0* 24.0* 24.5* 22.5*  MCV 98.8  < > 98.7 98.8 99.6 99.6 100.4*  PLT 170  < > 169 206 181 138* 99*  < > = values in this interval not displayed. Basic Metabolic Panel:  Recent Labs Lab 10/17/16 0505  10/19/16 0410 10/29/2016 0408 10/21/16 0446 10/22/16 0427 10/23/16 0454  NA 142  < >  141 136 134* 133* 133*  K 3.1*  < > 2.5* 3.6 3.6 4.0 3.4*  CL 113*  < > 109 108 103 102 103  CO2 24  < > _0 GLUCOSE 97  < > 98 124* 114* 159* 150*  BUN 12  < > _1 CREATININE 0.96  < > 0.77 0.69 0.67 0.60 0.53  CALCIUM 7.3*  < > 7.1* 7.4* 7.2* 7.1* 7.2*  MG 1.9  < > 1.6* 1.8 1.5* 1.8 1.9  PHOS 3.9  --  3.0 2.6 3.1 2.7  --   < > = values in this interval not displayed. GFR: Estimated Creatinine Clearance: 72.8 mL/min (by C-G formula based on SCr of 0.53 mg/dL). Liver Function Tests:  Recent Labs Lab 10/19/16 0410 10/27/2016 0408 10/21/16 0446 10/22/16 0427 10/23/16 0454  AST _2 ALT 11* 9* 11* 10* 9*  ALKPHOS 436* 429* 365* 414* 385*  BILITOT 0.7 0.7 0.5 0.4 0.5  PROT 4.5* 4.2* 4.4* 4.4* 4.2*  ALBUMIN 1.3* 1.3* 1.3* 1.3* 1.3*   No results for input(s): LIPASE, AMYLASE in the last 168 hours. No results for input(s): AMMONIA in the last 168 hours. Coagulation Profile:  Recent Labs Lab 10/21/2016 1431   INR 1.06   Cardiac Enzymes: No results for input(s): CKTOTAL, CKMB, CKMBINDEX, TROPONINI in the last 168 hours. BNP (last 3 results) No results for input(s): PROBNP in the last 8760 hours. HbA1C: No results for input(s): HGBA1C in the last 72 hours. CBG:  Recent Labs Lab 10/22/16 2107 10/23/16 0048 10/23/16 0531 10/23/16 0941 10/23/16 1155  GLUCAP 127* 183* 168* 206* 202*   Lipid Profile:  Recent Labs  10/23/16 0454  TRIG 277*   Thyroid Function Tests: No results for input(s): TSH, T4TOTAL, FREET4, T3FREE, THYROIDAB in the last 72 hours. Anemia Panel: No results for input(s): VITAMINB12, FOLATE, FERRITIN, TIBC, IRON, RETICCTPCT in the last 72 hours. Sepsis Labs: No results for input(s): PROCALCITON, LATICACIDVEN in the last 168 hours.  Recent Results (from the past 240 hour(s))  Culture, blood (routine x 2)     Status: None   Collection Time: 10/13/16  6:58 PM  Result Value Ref Range Status   Specimen Description BLOOD LEFT HAND  Final   Special Requests IN PEDIATRIC BOTTLE Blood Culture adequate volume  Final   Culture NO GROWTH 5 DAYS  Final   Report Status 10/18/2016 FINAL  Final  Culture, blood (routine x 2)     Status: None   Collection Time: 10/13/16  7:21 PM  Result Value Ref Range Status   Specimen Description BLOOD RIGHT HAND  Final   Special Requests IN PEDIATRIC BOTTLE Blood Culture adequate volume  Final   Culture NO GROWTH 5 DAYS  Final   Report Status 10/18/2016 FINAL  Final         Radiology Studies: Dg Abd 1 View  Result Date: 10/22/2016 CLINICAL DATA:  Evaluate NG tube placed EXAM: ABDOMEN - 1 VIEW COMPARISON:  CT abdomen and pelvis of 10/22/2016 FINDINGS: The tip of the NG tube is within the region of the duodenal bulb. There is some distention of small bowel consistent with partial small bowel obstruction. No colonic distention is seen. Surgical clips are present in the right upper quadrant from prior cholecystectomy. IMPRESSION: Tip of NG  tube in region of the duodenal bulb. Persistent partial small bowel obstruction. Electronically Signed   By: Windy Canny.D.  On: 10/22/2016 16:05   Ct Abdomen Pelvis W Contrast  Result Date: 10/22/2016 CLINICAL DATA:  Abdominal pain and nausea. Small bowel obstruction. Abdominal aortic dissection. EXAM: CT ABDOMEN AND PELVIS WITH CONTRAST TECHNIQUE: Multidetector CT imaging of the abdomen and pelvis was performed using the standard protocol following bolus administration of intravenous contrast. CONTRAST:  15m ISOVUE-300 IOPAMIDOL (ISOVUE-300) INJECTION 61% COMPARISON:  10/13/2016 FINDINGS: Lower Chest: Moderate bilateral pleural effusions and bibasilar atelectasis show mild increase since previous study. Hepatobiliary: No hepatic masses identified. Focal fatty infiltration again seen adjacent to falciform ligament. Prior cholecystectomy. No evidence of biliary dilatation. Pancreas:  No mass or inflammatory changes. Spleen: Spleen is normal in size. Decreased prominence of small peripheral splenic infarcts since prior study. 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 Left renal parenchymal atrophy and old infarcts are stable. No evidence of renal masses or hydronephrosis. A Foley catheter within the bladder, which is empty. Stomach/Bowel: Nasogastric tube again seen with tip in the antrum. Moderately dilated small bowel loops show no significant change. There is a transition point in the anterior right abdomen with nondilated distal small bowel. This is consistent with a distal small bowel obstruction. Mild enhancement and lack of normal fold pattern of distal small bowel is again noted. Mild wall thickening again seen involving the ascending, transverse, and descending colon. These findings are suspicious for enterocolitis. Mild ascites is increased since previous study, without evidence of abscess or free air.  Vascular/Lymphatic: No pathologically enlarged lymph nodes. No abdominal aortic aneurysm. 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 Reproductive: Prior hysterectomy noted. Adnexal regions are unremarkable in appearance. Other:  None. Musculoskeletal:  No suspicious bone lesions identified. IMPRESSION: Distal small bowel obstruction, without significant change. Mild increase in ascites.  No evidence of abscess or free air. Stable mild wall thickening involving distal small bowel loops and colon, suspicious for enterocolitis. Differential diagnosis includes infectious etiologies, ischemia, and inflammatory bowel disease. Mild increase in bilateral pleural effusions and bibasilar atelectasis. 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 Mild decrease in small splenic infarcts. Stable left renal parenchymal atrophy and old infarcts. Electronically Signed   By: JEarle GellM.D.   On: 10/22/2016 14:55   Dg Abd Portable 1v  Result Date: 10/23/2016 CLINICAL DATA:  Abdominal pain. EXAM: PORTABLE ABDOMEN - 1 VIEW COMPARISON:  10/22/2016. FINDINGS: NG tube in stable position. Prior cholecystectomy . Persistent dilated loops of bowel some of which may represent small bowel again noted without interim change. Persistent bowel wall thickening consistent with bowel edema again noted. No free air. IMPRESSION: 1. NG tube in stable position. 2. Persistent dilated loops of bowel some of which may represent small bowel again noted without interim change. Persistent bowel wall thickening consistent with bowel wall edema again noted. An active process such as enteritis could present this fashion. Reference is made to CT report of 10/22/2016. Electronically Signed   By: TMarcello Moores Register   On: 10/23/2016 08:01   Dg Abd Portable 1v  Result Date: 10/22/2016 CLINICAL DATA:  Abdominal pain EXAM: PORTABLE ABDOMEN - 1 VIEW COMPARISON:  Yesterday FINDINGS:  Diffuse gaseous distention of small bowel. Moderate gaseous distension of the distal stomach. No noted colonic distention. Haziness of the abdomen with bulging of the flanks and some centralization of small bowel loops. The same appearance was seen on CT scanogram 10/13/2016, when there was no significant ascites. IMPRESSION: No change  in preferential small bowel dilatation. Appearance correlates with partial small bowel obstruction seen on 10/13/2016 abdominal CT. Electronically Signed   By: Monte Fantasia M.D.   On: 10/22/2016 10:56   Dg Abd Portable 1v  Result Date: 10/21/2016 CLINICAL DATA:  Abdominal pain EXAM: PORTABLE ABDOMEN - 1 VIEW COMPARISON:  10/18/2016 FINDINGS: Dilated loops of small bowel in the left mid abdomen, suggesting small bowel obstruction. Adynamic small bowel ileus is also possible. Cholecystectomy clips. Thoracic aortic stent, incompletely visualized. IMPRESSION: Dilated loops of small bowel in the left mid abdomen, suggesting small bowel obstruction. Adynamic small bowel ileus is also possible. Electronically Signed   By: Julian Hy M.D.   On: 10/21/2016 19:51        Scheduled Meds: . ALPRAZolam  0.25 mg Oral QHS  . aspirin EC  81 mg Oral Daily  . dronabinol  5 mg Oral BID AC  . feeding supplement  1 Container Oral Q24H  . feeding supplement (ENSURE ENLIVE)  237 mL Oral TID BM  . insulin aspart  0-15 Units Subcutaneous Q4H  . lisdexamfetamine  20 mg Oral Daily  . metoprolol tartrate  10 mg Intravenous Q6H  . scopolamine  1 patch Transdermal Q72H  . sodium chloride flush  10-40 mL Intracatheter Q12H   Continuous Infusions: . Marland KitchenTPN (CLINIMIX-E) Adult 83 mL/hr at 10/22/16 1741  . Marland KitchenTPN (CLINIMIX-E) Adult    . anidulafungin Stopped (10/22/16 1957)  . cefTRIAXone (ROCEPHIN)  IV 2 g (10/23/16 1304)  . metronidazole Stopped (10/23/16 0612)  . potassium chloride 10 mEq (10/23/16 1304)     LOS: 24 days      Haley Shell, MD [facility]  If  7PM-7AM, please contact night-coverage [email or web address] Password [email or web address] 10/23/2016, 1:24 PM

## 2016-10-23 NOTE — Progress Notes (Signed)
Patient started on heparin drip this afternoon at 1426. At 1600, pt's brother in law called RN to room to stat she was coughing up blood. Upon entering patient's room, RN noted a tissue with pink tinged sputum. Dr. Rodena Piety notified. No new orders.  Joellen Jersey, RN

## 2016-10-24 ENCOUNTER — Inpatient Hospital Stay (HOSPITAL_COMMUNITY): Payer: BLUE CROSS/BLUE SHIELD

## 2016-10-24 LAB — CBC WITH DIFFERENTIAL/PLATELET
BASOS PCT: 0 %
Basophils Absolute: 0 10*3/uL (ref 0.0–0.1)
Eosinophils Absolute: 0 10*3/uL (ref 0.0–0.7)
Eosinophils Relative: 0 %
HEMATOCRIT: 22.7 % — AB (ref 36.0–46.0)
Hemoglobin: 7.2 g/dL — ABNORMAL LOW (ref 12.0–15.0)
LYMPHS PCT: 18 %
Lymphs Abs: 2.3 10*3/uL (ref 0.7–4.0)
MCH: 32.3 pg (ref 26.0–34.0)
MCHC: 31.7 g/dL (ref 30.0–36.0)
MCV: 101.8 fL — AB (ref 78.0–100.0)
Monocytes Absolute: 0.9 10*3/uL (ref 0.1–1.0)
Monocytes Relative: 7 %
NEUTROS PCT: 75 %
Neutro Abs: 9.3 10*3/uL — ABNORMAL HIGH (ref 1.7–7.7)
Platelets: 81 10*3/uL — ABNORMAL LOW (ref 150–400)
RBC: 2.23 MIL/uL — AB (ref 3.87–5.11)
RDW: 21.5 % — ABNORMAL HIGH (ref 11.5–15.5)
WBC: 12.5 10*3/uL — ABNORMAL HIGH (ref 4.0–10.5)

## 2016-10-24 LAB — BASIC METABOLIC PANEL
ANION GAP: 5 (ref 5–15)
BUN: 15 mg/dL (ref 6–20)
CALCIUM: 7.5 mg/dL — AB (ref 8.9–10.3)
CO2: 23 mmol/L (ref 22–32)
Chloride: 104 mmol/L (ref 101–111)
Creatinine, Ser: 0.5 mg/dL (ref 0.44–1.00)
Glucose, Bld: 189 mg/dL — ABNORMAL HIGH (ref 65–99)
POTASSIUM: 4.5 mmol/L (ref 3.5–5.1)
Sodium: 132 mmol/L — ABNORMAL LOW (ref 135–145)

## 2016-10-24 LAB — GLUCOSE, CAPILLARY
GLUCOSE-CAPILLARY: 198 mg/dL — AB (ref 65–99)
Glucose-Capillary: 207 mg/dL — ABNORMAL HIGH (ref 65–99)

## 2016-10-24 LAB — HEPARIN LEVEL (UNFRACTIONATED): Heparin Unfractionated: 0.17 IU/mL — ABNORMAL LOW (ref 0.30–0.70)

## 2016-10-24 LAB — MAGNESIUM: MAGNESIUM: 1.9 mg/dL (ref 1.7–2.4)

## 2016-10-24 MED ORDER — HYDROXYZINE HCL 50 MG/ML IM SOLN
50.0000 mg | Freq: Four times a day (QID) | INTRAMUSCULAR | Status: DC | PRN
  Administered 2016-10-25: 50 mg via INTRAMUSCULAR
  Filled 2016-10-24 (×2): qty 1

## 2016-10-24 MED ORDER — TRACE MINERALS CR-CU-MN-SE-ZN 10-1000-500-60 MCG/ML IV SOLN
INTRAVENOUS | Status: DC
  Administered 2016-10-25: via INTRAVENOUS
  Filled 2016-10-24: qty 1992

## 2016-10-24 MED ORDER — HYDROXYZINE HCL 25 MG PO TABS: 25.0000 mg | ORAL_TABLET | Freq: Three times a day (TID) | ORAL | Status: DC | PRN

## 2016-10-24 MED ORDER — HALOPERIDOL LACTATE 5 MG/ML IJ SOLN: 5.0000 mg | Freq: Once | INTRAMUSCULAR | Status: DC

## 2016-10-24 MED ORDER — MAGNESIUM SULFATE 2 GM/50ML IV SOLN
2.0000 g | Freq: Once | INTRAVENOUS | Status: AC
  Administered 2016-10-24: 2 g via INTRAVENOUS
  Filled 2016-10-24: qty 50

## 2016-10-24 NOTE — Progress Notes (Signed)
ANTICOAGULATION CONSULT NOTE - Follow Up Consult  Pharmacy Consult for heparin Indication: DVT  Labs:  Recent Labs  10/22/16 0427  10/22/16 1501 10/23/16 0454 10/23/16 2118 10/24/16 0353  HGB  --   < > 7.8* 7.1*  --  7.2*  HCT  --   --  24.5* 22.5*  --  22.7*  PLT  --   --  138* 99*  --  81*  HEPARINUNFRC  --   --   --   --  0.33 0.17*  CREATININE 0.60  --   --  0.53  --   --   < > = values in this interval not displayed.   Assessment: 61yo female now subtherapeutic on heparin despite rate increase yesterday; Hgb stable though Plt continue to decrease; RN notes that pt has bled from new IV site.  Goal of Therapy:  Heparin level 0.3-0.7 units/ml   Plan:  Will hold off on bolus 2/2 bleeding but increase heparin gtt by 3 units/kg/hr to 1250 units/hr and check heparin level in 6hr.  Wynona Neat, PharmD, BCPS  10/24/2016,5:08 AM

## 2016-10-24 NOTE — Progress Notes (Signed)
No acute surgical needs. Continue medical management of ileus 2/2 enteritis with abx, bowl rest, and judicious advancement of diet. For further recommendations regarding enteritis treatment, recommend GI consult.  General surgery will sign off.  Obie Dredge, PA-C [facility] Pager: [phone] Consults: [phone] Mon-Fri 7:00 am-4:30 pm Sat-Sun 7:00 am-11:30 am

## 2016-10-24 NOTE — Progress Notes (Signed)
ANTICOAGULATION CONSULT NOTE - Follow Up Consult  Pharmacy Consult for heparin Indication: DVT  Labs:  Recent Labs  10/22/16 0427  10/22/16 1501 10/23/16 0454 10/23/16 2118 10/24/16 0353  HGB  --   < > 7.8* 7.1*  --  7.2*  HCT  --   --  24.5* 22.5*  --  22.7*  PLT  --   --  138* 99*  --  81*  HEPARINUNFRC  --   --   --   --  0.33 0.17*  CREATININE 0.60  --   --  0.53  --  0.50  < > = values in this interval not displayed.   Assessment: 61yo female on heparin for bilateral upper extremity DVTs; RN called pharmacy to inform pt lost IV access from [id number] today. The confirmatory heparin level has been rescheduled for later today and CBC has been added. HgB remains low 7.2 and PLT continues to trend down, now 81. No bleeding reported from RN when she called about the lost IV.   Goal of Therapy:  Heparin level 0.3-0.7 units/ml Monitor platelets by anticoagulation protocol: Yes  Plan:  Continue heparin gtt 1250 units/hr Confirmatory heparin level/CBC at 1800 Daily heparin level/CBC Monitor for s/sx of bleeding  Georga Bora, PharmD Clinical Pharmacist 10/24/2016 2:48 PM

## 2016-10-24 NOTE — Progress Notes (Addendum)
TPN noted to be refused earlier during day shift. Spoke with pt and family member in room about this, and they are agreeable to restarting it. Notified IV team and pharmacy, TPN now infusing. Will continue to closely monitor pt.

## 2016-10-24 NOTE — Progress Notes (Signed)
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 She presented with weakness and met sepsis criteria initially with concern for enteritis.  She grew a yeast UTI and on 8/18 1/2 blood cultures grew yeast and the patient was started on eraxis. She was switched to fluconazole and antibiotics were d/c'd on 8/20 with blood cultures only growing candida. ID recommended continuing fluconazole 400 mg daily through 9/26 and recommended ophtho exam within the next month.   She had an echo on 8/28 which showed new systolic dysfunction and her QT was markedly prolonged so she was changed back from fluconazole to Eraxis until 9/26. Cardiology was consulted and she was diuresed.   On 9/10 she developed new nausea and vomiting. KUB showed concern for a SBO. CT confirmed this and also showed evidence of possible enteritis. Repeat blood cultures obtained and started on ceftriaxone/flagyl.  Given severe malnutrition will need to consider TPN for Mrs. Friel and won't be able to place PICC until blood Cx are Negative x 3 days per ID reccs.   On 9/12 ECHO was repeated and was essentially the same; Deferring to Cardiology for further invasive procedures such as angiography and likely to be done early next if stable  Patient had Rapid Response on the night of 10/15/16 and ended up on BiPAP and being diuresed. She improved but continued to be delirious. This AM NGT was removed by Surgery and patient was placed on FULL Liquid Diet. TPN was ordered  for Patient but held due to evaluating if she could tolerate a FULL Diet and possibly TF through [place].  Another Rapid Response on 10/16/16 was called on the patient as patient was increasingly Tachypenic on BiPAP. I went to assess the patient and ordered a Stat CXR, ABG, and gave an additional 40 of IV Lasix. Had some flash pulmonary edema. Patient was maintaining saturations but still had a tenuous respiratory status so PCCM was called to evaluate and after evaluation patient improved .  BIPAP was started today again as patient co shortness of breath ,anxiety unable to breathe.updated patients husband.chestxray done today no acute changes.kub showed gaseous distension of bowels.    Assessment & Plan:   Principal Problem:   Candidemia ([place]) Active Problems:   GERD (gastroesophageal reflux disease)   Dissection of thoracoabdominal aorta (HCC)   AAA (abdominal aortic aneurysm) (HCC)   Benign essential HTN   Diabetes mellitus type 2 in obese (HCC)   Anxiety state   Hyponatremia   Leukocytosis   Sepsis (HCC)   Hypotension   AKI (acute kidney injury) ([place])   Dehydration   Generalized weakness   Anemia   Thrombocytopenia (HCC)   Abnormal computed tomography angiography (CTA) of abdomen and pelvis   Colitis: Probable   Protein-calorie malnutrition, severe   Palliative care by specialist   Adult failure to thrive   Hypophosphatemia   Hypomagnesemia   Depression   Acute systolic (congestive) heart failure ([place])   DNR (do not resuscitate) discussion   Congestive heart failure (HCC)   Heme positive stool   Cardiac LV ejection fraction 30-35%   QT prolongation   Acute hypercapnic respiratory failure (  [place])   Metabolic encephalopathy   Pulmonary edema cardiac cause (HCC)   Acute respiratory distress   Encephalopathy acute   Hypokalemia   Demand ischemia (HCC)   SBO (small bowel obstruction) ([place])   Visual hallucination  Candidemia patient currently on Eraxis to be continued  till 10/28/2016. Patient needs follow-up ophthalmologic exam after discharge. Patient could not tolerate fluconazole due to prolonged QT. Antifungal should be continued for 6 weeks. Repeat blood culture 2 shows no growth.A PICC was placed on 10/15/2016 4 TPN. Patient was not taking enough calories by mouth so TPN was started.  Chronic DVT B/L upper extremity started heparin.  Hypokalemia being repleted in TPN.  Anemia of chronic disease- hb trending down.gauiac stools.hb stable today.  Acute systolic CHF ejection fraction 35-40%. Currently Lasix being on hold due to nausea vomiting and no signs of chf.  Small bowel obstruction by KUB follow-up KUB today.local ileus 10/18/16.ngt placed yesterday.dw surgery.ct abdomen to be done yesterday.i had long dw daughter who wants everything to be done for the patient.they have picked a SNF for patietnt to go once discharged.continue npo,tpn.  Severe protein calorie malnutrition on tpn.  Thrombocytopenia -platelets dropping even before heparin was started.will monitor.  Anxiety/depression will start hydroxizine IM.    DVT prophylaxisheparin Code Status: full Family Communication: husband Disposition Plan: tbd   Consultants: surgery  Procedures: picc  Antimicrobials: arexis   Subjective:co anxiety.used to take xanax at home.  Objective: Vitals:   10/24/16 0756 10/24/16 1045 10/24/16 1200 10/24/16 1239  BP: (!) 153/85  (!) 156/90   Pulse:  (!) 118    Resp: (!) 21 (!) 28 (!) 21   Temp: 97.6 F (36.4 C)  98.5 F (36.9 C)   TempSrc: Oral  Axillary   SpO2: 100% 98% 100% 99%  Weight:      Height:        Intake/Output Summary (Last 24 hours) at 10/24/16 1413 Last data filed at 10/24/16 0610  Gross per 24 hour  Intake          2012.59 ml  Output             1240 ml  Net           772.59 ml   Filed Weights   10/22/16 0530 10/23/16 0530 10/24/16 0500  Weight: 73.2 kg (161 lb 6.4 oz) 75.7 kg (166 lb 14.4 oz) 76.9 kg (169  lb 9.6 oz)    Examination:  General exam: Appears calm and comfortable  Respiratory system: Clear to auscultation. Respiratory effort normal. Cardiovascular system: S1 & S2 heard, RRR. No JVD, murmurs, rubs, gallops or clicks. No pedal edema. Gastrointestinal system: Abdomen is distended, soft and tender.  bowel sounds diminished. Central nervous system: Alert and oriented. No focal neurological deficits. Extremities: 1 plus edema. Skin: No rashes, lesions or ulcers Psychiatry: Judgement and insight appear normal. Mood & affect appropriate.     Data Reviewed: I have personally reviewed following labs and imaging studies  CBC:  Recent Labs Lab 10/19/16 0410 10/04/2016 0408 10/21/16 0446 10/22/16 1501 10/23/16 0454 10/24/16 0353  WBC 10.4 9.9 9.8 10.0 8.2 12.5*  NEUTROABS 8.2* 7.1 6.8  --  5.5 9.3*  HGB 7.7* 7.9* 7.8* 7.8* 7.1* 7.2*  HCT 23.6* 25.0* 24.0* 24.5* 22.5* 22.7*  MCV 98.7 98.8 99.6 99.6 100.4* 101.8*  PLT 169 206 181 138* 99* 81*   Basic Metabolic Panel:  Recent Labs Lab 10/19/16 0410 10/28/2016 0408 10/21/16 0446 10/22/16 0427 10/23/16 0454 10/24/16 1856  NA 141 136 134* 133* 133* 132*  K 2.5* 3.6 3.6 4.0 3.4* 4.5  CL 109 108 103 102 103 104  CO2 '28 24 27 26 27 23  ' GLUCOSE 98 124* 114* 159* 150* 189*  BUN '8 9 10 12 14 15  ' CREATININE 0.77 0.69 0.67 0.60 0.53 0.50  CALCIUM 7.1* 7.4* 7.2* 7.1* 7.2* 7.5*  MG 1.6* 1.8 1.5* 1.8 1.9 1.9  PHOS 3.0 2.6 3.1 2.7  --   --    GFR: Estimated Creatinine Clearance: 73.4 mL/min (by C-G formula based on SCr of 0.5 mg/dL). Liver Function Tests:  Recent Labs Lab 10/19/16 0410 10/29/2016 0408 10/21/16 0446 10/22/16 0427 10/23/16 0454  AST '15 17 17 19 15  ' ALT 11* 9* 11* 10* 9*  ALKPHOS 436* 429* 365* 414* 385*  BILITOT 0.7 0.7 0.5 0.4 0.5  PROT 4.5* 4.2* 4.4* 4.4* 4.2*  ALBUMIN 1.3* 1.3* 1.3* 1.3* 1.3*   No results for input(s): LIPASE, AMYLASE in the last 168 hours. No results for input(s): AMMONIA in the  last 168 hours. Coagulation Profile:  Recent Labs Lab 10/06/2016 1431  INR 1.06   Cardiac Enzymes: No results for input(s): CKTOTAL, CKMB, CKMBINDEX, TROPONINI in the last 168 hours. BNP (last 3 results) No results for input(s): PROBNP in the last 8760 hours. HbA1C: No results for input(s): HGBA1C in the last 72 hours. CBG:  Recent Labs Lab 10/23/16 1630 10/23/16 2028 10/23/16 2328 10/24/16 0417 10/24/16 1154  GLUCAP 179* 173* 210* 198* 207*   Lipid Profile:  Recent Labs  10/23/16 0454  TRIG 277*   Thyroid Function Tests: No results for input(s): TSH, T4TOTAL, FREET4, T3FREE, THYROIDAB in the last 72 hours. Anemia Panel: No results for input(s): VITAMINB12, FOLATE, FERRITIN, TIBC, IRON, RETICCTPCT in the last 72 hours. Sepsis Labs: No results for input(s): PROCALCITON, LATICACIDVEN in the last 168 hours.  No results found for this or any previous visit (from the past 240 hour(s)).       Radiology Studies: Dg Chest 1 View  Result Date: 10/24/2016 CLINICAL DATA:  Hypoxia EXAM: CHEST 1 VIEW COMPARISON:  10/17/2016 FINDINGS: Right-sided PICC line is again identified and stable. A nasogastric catheter is now seen in the stomach. Thoracic stent graft is again noted and stable. Cardiac shadow is stable. Bibasilar atelectasis and small effusions are seen. No new focal abnormality is noted. IMPRESSION: Interval placement of nasogastric catheter. The remainder of the exam is stable. Electronically Signed   By: Inez Catalina M.D.   On: 10/24/2016 09:17   Dg Abd 1 View  Result Date: 10/24/2016 CLINICAL DATA:  Abdominal pain. EXAM: ABDOMEN - 1 VIEW COMPARISON:  10/23/2016 FINDINGS: Nasogastric tube extends into the abdomen. The catheter tip is likely in the duodenum. Gas in the stomach and throughout the bowel loops in the abdomen and pelvis. Stent graft again noted in the lower thoracic aorta. Evidence for cholecystectomy clips. Stable consolidation or densities at left lung  base. Limited evaluation for free air on this supine view. IMPRESSION: Stable position of nasogastric tube, likely in the duodenum. No significant change in the bowel gas pattern. Scattered gas-filled loops of bowel throughout the abdomen and upper pelvis. Persistent densities at left lung base. Electronically Signed   By: Markus Daft M.D.   On: 10/24/2016 09:23   Dg Abd 1 View  Result Date: 10/22/2016 CLINICAL DATA:  Evaluate NG tube placed EXAM: ABDOMEN - 1 VIEW COMPARISON:  CT abdomen and pelvis of 10/22/2016 FINDINGS: The tip of the NG  tube is within the region of the duodenal bulb. There is some distention of small bowel consistent with partial small bowel obstruction. No colonic distention is seen. Surgical clips are present in the right upper quadrant from prior cholecystectomy. IMPRESSION: Tip of NG tube in region of the duodenal bulb. Persistent partial small bowel obstruction. Electronically Signed   By: Ivar Drape M.D.   On: 10/22/2016 16:05   Ct Abdomen Pelvis W Contrast  Result Date: 10/22/2016 CLINICAL DATA:  Abdominal pain and nausea. Small bowel obstruction. Abdominal aortic dissection. EXAM: CT ABDOMEN AND PELVIS WITH CONTRAST TECHNIQUE: Multidetector CT imaging of the abdomen and pelvis was performed using the standard protocol following bolus administration of intravenous contrast. CONTRAST:  169m ISOVUE-300 IOPAMIDOL (ISOVUE-300) INJECTION 61% COMPARISON:  10/13/2016 FINDINGS: Lower Chest: Moderate bilateral pleural effusions and bibasilar atelectasis show mild increase since previous study. Hepatobiliary: No hepatic masses identified. Focal fatty infiltration again seen adjacent to falciform ligament. Prior cholecystectomy. No evidence of biliary dilatation. Pancreas:  No mass or inflammatory changes. Spleen: Spleen is normal in size. Decreased prominence of small peripheral splenic infarcts since prior study. 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 Left renal parenchymal atrophy and old infarcts are stable. No evidence of renal masses or hydronephrosis. A Foley catheter within the bladder, which is empty. Stomach/Bowel: Nasogastric tube again seen with tip in the antrum. Moderately dilated small bowel loops show no significant change. There is a transition point in the anterior right abdomen with nondilated distal small bowel. This is consistent with a distal small bowel obstruction. Mild enhancement and lack of normal fold pattern of distal small bowel is again noted. Mild wall thickening again seen involving the ascending, transverse, and descending colon. These findings are suspicious for enterocolitis. Mild ascites is increased since previous study, without evidence of abscess or free air. Vascular/Lymphatic: No pathologically enlarged lymph nodes. No abdominal aortic aneurysm. 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 Reproductive: Prior hysterectomy noted. Adnexal regions are unremarkable in appearance. Other:  None. Musculoskeletal:  No suspicious bone lesions identified. IMPRESSION: Distal small bowel obstruction, without significant change. Mild increase in ascites.  No evidence of abscess or free air. Stable mild wall thickening involving distal small bowel loops and colon, suspicious for enterocolitis. Differential diagnosis includes infectious etiologies, ischemia, and inflammatory bowel disease. Mild increase in bilateral pleural effusions and bibasilar atelectasis. 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 Mild decrease in small splenic infarcts. Stable left renal parenchymal atrophy and old infarcts. Electronically Signed   By: JEarle GellM.D.   On: 10/22/2016 14:55   Dg Abd Portable 1v  Result Date: 10/23/2016 CLINICAL DATA:  NG tube placement EXAM: PORTABLE ABDOMEN - 1 VIEW COMPARISON:  10/23/2016 FINDINGS:  Partially visualized aortic stent. Surgical clips in the right upper quadrant. Esophageal tube tip overlies the gastroduodenal junction. Slight decreased gaseous enlargement of bowel. IMPRESSION: 1. Esophageal tube tip overlies the gastroduodenal junction 2. Slight decreased gaseous dilatation of bowel 3. Atelectasis or infiltrate at the left lung base Electronically Signed   By: KDonavan FoilM.D.   On: 10/23/2016 19:58   Dg Abd Portable 1v  Result Date: 10/23/2016 CLINICAL DATA:  Abdominal pain. EXAM: PORTABLE ABDOMEN - 1 VIEW COMPARISON:  10/22/2016. FINDINGS: NG tube in stable position. Prior cholecystectomy . Persistent dilated loops of bowel some of which may represent small bowel again noted without interim change. Persistent  bowel wall thickening consistent with bowel edema again noted. No free air. IMPRESSION: 1. NG tube in stable position. 2. Persistent dilated loops of bowel some of which may represent small bowel again noted without interim change. Persistent bowel wall thickening consistent with bowel wall edema again noted. An active process such as enteritis could present this fashion. Reference is made to CT report of 10/22/2016. Electronically Signed   By: Marcello Moores  Register   On: 10/23/2016 08:01        Scheduled Meds: . ALPRAZolam  0.25 mg Oral QHS  . aspirin EC  81 mg Oral Daily  . dronabinol  5 mg Oral BID AC  . feeding supplement  1 Container Oral Q24H  . feeding supplement (ENSURE ENLIVE)  237 mL Oral TID BM  . insulin aspart  0-15 Units Subcutaneous Q4H  . lisdexamfetamine  20 mg Oral Daily  . metoprolol tartrate  10 mg Intravenous Q6H  . scopolamine  1 patch Transdermal Q72H  . sodium chloride flush  10-40 mL Intracatheter Q12H   Continuous Infusions: . Marland KitchenTPN (CLINIMIX-E) Adult 83 mL/hr at 10/23/16 1706  . Marland KitchenTPN (CLINIMIX-E) Adult    . anidulafungin Stopped (10/23/16 1742)  . cefTRIAXone (ROCEPHIN)  IV 2 g (10/24/16 1405)  . heparin 1,250 Units/hr (10/24/16 1253)  .  metronidazole 500 mg (10/24/16 1406)     LOS: 22 days     Georgette Shell, MD [facility]   If 7PM-7AM, please contact night-coverage [email or web address] Password [facility] 10/24/2016, 2:13 PM

## 2016-10-24 NOTE — Progress Notes (Signed)
Clinical Social Worker continues to follow patient and family for support and disposition plan once patient becomes stable.   Rhea Pink, MSW,  [place]

## 2016-10-24 NOTE — Progress Notes (Signed)
Patient was found with her NGT pulled out saying ''I dont't want anymore medical treatment', MD notified, instructions given to leave the NGT disconnected for now, will continue to monitor.

## 2016-10-24 NOTE — Progress Notes (Addendum)
Called by CCMD that pt having frequent pauses, up to 4.1 sec. HR fluctuating between 110s, then down to 30s for few secs, back up again. Other vitals stable. Smith MD notified, no new orders, will continue to monitor.

## 2016-10-24 NOTE — Progress Notes (Signed)
Heparin put on hold due to loss of peripheral iv, pharmacy informed this RN not to run any medication with TPN going through the right upper arm PICC, will get another PIV and restart heprain drip.

## 2016-10-24 NOTE — Progress Notes (Signed)
ANTICOAGULATION CONSULT NOTE - Follow Up Consult  Pharmacy Consult for heparin Indication: DVT  Labs:  Recent Labs  10/22/16 0427  10/22/16 1501 10/23/16 0454 10/23/16 2118 10/24/16 0353  HGB  --   < > 7.8* 7.1*  --  7.2*  HCT  --   --  24.5* 22.5*  --  22.7*  PLT  --   --  138* 99*  --  81*  HEPARINUNFRC  --   --   --   --  0.33 0.17*  CREATININE 0.60  --   --  0.53  --  0.50  < > = values in this interval not displayed.   Assessment: 61yo female on heparin for bilateral upper extremity DVTs; RN called pharmacy to inform pt lost IV access from 1100-1253 today. Heparin resumed in central line, but patient is now refusing all care. Went to talk to patient and she asked me to stop all interventions. Paged MD and she asked me to discontinue heparin infusion if patient did not let the IV team collect evening level. IV team informed pharmacist of further patient refusal, therefore heparin will be held until further notice.   Goal of Therapy:  Heparin level 0.3-0.7 units/ml Monitor platelets by anticoagulation protocol: Yes  Plan:  Stop heparin infusion  Monitor for s/sx of bleeding  Georga Bora, PharmD Clinical Pharmacist 10/24/2016 6:09 PM

## 2016-10-24 NOTE — Progress Notes (Signed)
PHARMACY - ADULT TOTAL PARENTERAL NUTRITION CONSULT NOTE   Pharmacy Consult:  TPN Indication:  PCM  Patient Measurements: Height: '5\' 3"'  (160 cm) Weight: 169 lb 9.6 oz (76.9 kg) IBW/kg (Calculated) : 52.4 TPN AdjBW (KG): 55.7 Body mass index is 30.04 kg/m.  Assessment:  36 YOF with PMH of type B aortic dissection, DM, gastroparesis, GERD, HTN, IBD who presented with enteritis.  Found to have fungal bacteremia with plans to be on fluconazole through 10/28/16.  On 10/12/16 patient developed N/V and KUB showed concern for SBO; confirmed by CT.  Patient is severely malnourished and family/patient is refusing cortrak placement for TF.  Pharmacy consulted to manage TPN for nutritional support.  GI: Concern for ileus but seems to be resolving. Pepcid to be added to TPN. Albumin low at 1.3. Preablumin low at 14.8. Last BM 9/21. Abdominal xray on 9/21 shows decrease in gaseous dilatation of bowel. Boost Breeze and [employer] ordered but not taking due to NPO status. Surgery signing off and recommending GI consult. NG tube output significantly lower at 267m. Endo: No hx DM. CBGs trending up to 170-200s on SSI Insulin requirements in the past 24 hours: 19 units Lytes: wnl today exc Na 132. K at 4.5. Mag 1.9. CoCa 9.7. Renal: SCr stable, BUN wnl Pulm: flash pulm edema resolved - on 6L HFNC Cards: BP improving, HR controlled - ASA, IV metoprolol.  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  TG elevated but trending down to 277.  Neuro: A&O - Xanax, Vyvanse, scopolamine patch, PRN APAP ID: Eraxis/Flagyl/CTX for fungal bacteremia + IAA. Was switched to Eraxis this admit. Afebrile, WBC up to 12.5 today TPN Access: PICC placed 10/15/16 TPN start date: 10/19/16  Nutritional Goals (per RD recommendation on 9/17): KCal: 2000 - 2200 / day Protein: 110 - 120 gm / day  Current Nutrition:  NPO TPN  Plan:  Continue Clinimix E 5/15 at  861mhr Continue to hold IV lipid emulsions, will consider restarting on 9/24 with a MWF regimen TPN provides 100g of protein and 1414 kCals per day meeting >90% of protein and >70% of kCal needs Add MVI and trace elements in TPN Add Pepcid 4021mo TPN Add 15 units of regular insulin to TPN Continue moderate SSI Q4h and adjust as needed Monitor TPN labs, Bmet tomorrow  Give Mg 2g IV x 1  Encourage trial of TFs if able   [name], BCPS Clinical Pharmacist Pager [phone] 7:40 AM

## 2016-10-25 ENCOUNTER — Inpatient Hospital Stay (HOSPITAL_COMMUNITY): Payer: BLUE CROSS/BLUE SHIELD

## 2016-10-25 DIAGNOSIS — Z66 Do not resuscitate: Secondary | ICD-10-CM

## 2016-10-25 DIAGNOSIS — Z978 Presence of other specified devices: Secondary | ICD-10-CM

## 2016-10-25 DIAGNOSIS — J9601 Acute respiratory failure with hypoxia: Secondary | ICD-10-CM

## 2016-10-25 DIAGNOSIS — R0609 Other forms of dyspnea: Secondary | ICD-10-CM

## 2016-10-25 DIAGNOSIS — I428 Other cardiomyopathies: Secondary | ICD-10-CM

## 2016-10-25 DIAGNOSIS — I255 Ischemic cardiomyopathy: Secondary | ICD-10-CM

## 2016-10-25 DIAGNOSIS — I495 Sick sinus syndrome: Secondary | ICD-10-CM

## 2016-10-25 DIAGNOSIS — R001 Bradycardia, unspecified: Secondary | ICD-10-CM

## 2016-10-25 LAB — CBC
HCT: 23.7 % — ABNORMAL LOW (ref 36.0–46.0)
Hemoglobin: 7.3 g/dL — ABNORMAL LOW (ref 12.0–15.0)
MCH: 32.2 pg (ref 26.0–34.0)
MCHC: 30.8 g/dL (ref 30.0–36.0)
MCV: 104.4 fL — AB (ref 78.0–100.0)
PLATELETS: 100 10*3/uL — AB (ref 150–400)
RBC: 2.27 MIL/uL — AB (ref 3.87–5.11)
RDW: 21.3 % — AB (ref 11.5–15.5)
WBC: 17.4 10*3/uL — ABNORMAL HIGH (ref 4.0–10.5)

## 2016-10-25 LAB — CBC WITH DIFFERENTIAL/PLATELET
BASOS PCT: 0 %
Basophils Absolute: 0 10*3/uL (ref 0.0–0.1)
EOS ABS: 0 10*3/uL (ref 0.0–0.7)
EOS PCT: 0 %
HEMATOCRIT: 23.3 % — AB (ref 36.0–46.0)
HEMOGLOBIN: 7.3 g/dL — AB (ref 12.0–15.0)
LYMPHS PCT: 6 %
Lymphs Abs: 1.1 10*3/uL (ref 0.7–4.0)
MCH: 32.9 pg (ref 26.0–34.0)
MCHC: 31.3 g/dL (ref 30.0–36.0)
MCV: 105 fL — AB (ref 78.0–100.0)
Monocytes Absolute: 1.1 10*3/uL — ABNORMAL HIGH (ref 0.1–1.0)
Monocytes Relative: 6 %
NEUTROS ABS: 16.4 10*3/uL — AB (ref 1.7–7.7)
Neutrophils Relative %: 88 %
PLATELETS: 72 10*3/uL — AB (ref 150–400)
RBC: 2.22 MIL/uL — ABNORMAL LOW (ref 3.87–5.11)
RDW: 21.4 % — ABNORMAL HIGH (ref 11.5–15.5)
WBC: 18.6 10*3/uL — ABNORMAL HIGH (ref 4.0–10.5)

## 2016-10-25 LAB — POCT I-STAT 3, ART BLOOD GAS (G3+)
Acid-Base Excess: 1 mmol/L (ref 0.0–2.0)
Acid-base deficit: 4 mmol/L — ABNORMAL HIGH (ref 0.0–2.0)
Bicarbonate: 24.5 mmol/L (ref 20.0–28.0)
Bicarbonate: 26 mmol/L (ref 20.0–28.0)
O2 SAT: 99 %
O2 Saturation: 100 %
PCO2 ART: 60.6 mmHg — AB (ref 32.0–48.0)
PH ART: 7.215 — AB (ref 7.350–7.450)
PH ART: 7.416 (ref 7.350–7.450)
PO2 ART: 183 mmHg — AB (ref 83.0–108.0)
Patient temperature: 98.5
TCO2: 26 mmol/L (ref 22–32)
TCO2: 27 mmol/L (ref 22–32)
pCO2 arterial: 39.9 mmHg (ref 32.0–48.0)
pO2, Arterial: 217 mmHg — ABNORMAL HIGH (ref 83.0–108.0)

## 2016-10-25 LAB — BASIC METABOLIC PANEL
ANION GAP: 6 (ref 5–15)
BUN: 19 mg/dL (ref 6–20)
CALCIUM: 8.2 mg/dL — AB (ref 8.9–10.3)
CO2: 22 mmol/L (ref 22–32)
Chloride: 106 mmol/L (ref 101–111)
Creatinine, Ser: 0.49 mg/dL (ref 0.44–1.00)
GFR calc Af Amer: >60 mL/min (ref >60)
GFR calc non Af Amer: >60 mL/min (ref >60)
GLUCOSE: 157 mg/dL — AB (ref 65–99)
Potassium: 4.7 mmol/L (ref 3.5–5.1)
Sodium: 134 mmol/L — ABNORMAL LOW (ref 135–145)

## 2016-10-25 LAB — LACTIC ACID, PLASMA
LACTIC ACID, VENOUS: 1 mmol/L (ref 0.5–1.9)
Lactic Acid, Venous: 0.8 mmol/L (ref 0.5–1.9)

## 2016-10-25 LAB — MAGNESIUM: Magnesium: 2.1 mg/dL (ref 1.7–2.4)

## 2016-10-25 LAB — GLUCOSE, CAPILLARY
GLUCOSE-CAPILLARY: 122 mg/dL — AB (ref 65–99)
Glucose-Capillary: 154 mg/dL — ABNORMAL HIGH (ref 65–99)

## 2016-10-25 LAB — TSH: TSH: 5.262 u[IU]/mL — ABNORMAL HIGH (ref 0.350–4.500)

## 2016-10-25 MED ORDER — FENTANYL CITRATE (PF) 100 MCG/2ML IJ SOLN
25.0000 ug | INTRAMUSCULAR | Status: DC | PRN
Start: 2016-10-25 — End: 2016-10-26
  Administered 2016-10-25: 25 ug via INTRAVENOUS
  Administered 2016-10-25: 50 ug via INTRAVENOUS

## 2016-10-25 MED ORDER — GLYCOPYRROLATE 1 MG PO TABS
1.0000 mg | ORAL_TABLET | ORAL | Status: DC | PRN
  Filled 2016-10-25: qty 1

## 2016-10-25 MED ORDER — ORAL CARE MOUTH RINSE: 15.0000 mL | Freq: Four times a day (QID) | OROMUCOSAL | Status: DC

## 2016-10-25 MED ORDER — INSULIN ASPART 100 UNIT/ML ~~LOC~~ SOLN
2.0000 [IU] | SUBCUTANEOUS | Status: DC
  Administered 2016-10-25: 2 [IU] via SUBCUTANEOUS
  Administered 2016-10-25: 4 [IU] via SUBCUTANEOUS

## 2016-10-25 MED ORDER — BIOTENE DRY MOUTH MT LIQD: 15.0000 mL | OROMUCOSAL | Status: DC | PRN

## 2016-10-25 MED ORDER — DEXTROSE-NACL 5-0.45 % IV SOLN
INTRAVENOUS | Status: DC
  Administered 2016-10-25: 03:00:00 via INTRAVENOUS

## 2016-10-25 MED ORDER — FUROSEMIDE 10 MG/ML IJ SOLN
8.0000 mg/h | INTRAVENOUS | Status: DC
  Administered 2016-10-25: 8 mg/h via INTRAVENOUS
  Filled 2016-10-25: qty 25

## 2016-10-25 MED ORDER — FENTANYL CITRATE (PF) 100 MCG/2ML IJ SOLN
INTRAMUSCULAR | Status: AC
  Filled 2016-10-25: qty 2

## 2016-10-25 MED ORDER — ACETAMINOPHEN 650 MG RE SUPP: 650.0000 mg | Freq: Four times a day (QID) | RECTAL | Status: DC | PRN

## 2016-10-25 MED ORDER — MIDAZOLAM HCL 2 MG/2ML IJ SOLN
4.0000 mg | Freq: Once | INTRAMUSCULAR | Status: AC
  Administered 2016-10-25: 4 mg via INTRAVENOUS

## 2016-10-25 MED ORDER — ROCURONIUM BROMIDE 50 MG/5ML IV SOLN
0.6000 mg/kg | Freq: Once | INTRAVENOUS | Status: DC
  Filled 2016-10-25: qty 4.61

## 2016-10-25 MED ORDER — ONDANSETRON 4 MG PO TBDP: 4.0000 mg | ORAL_TABLET | Freq: Four times a day (QID) | ORAL | Status: DC | PRN

## 2016-10-25 MED ORDER — SODIUM CHLORIDE 0.9% FLUSH: 10.0000 mL | INTRAVENOUS | Status: DC | PRN

## 2016-10-25 MED ORDER — ONDANSETRON HCL 4 MG/2ML IJ SOLN: 4.0000 mg | Freq: Four times a day (QID) | INTRAMUSCULAR | Status: DC | PRN

## 2016-10-25 MED ORDER — MIDAZOLAM HCL 2 MG/2ML IJ SOLN
2.0000 mg | INTRAMUSCULAR | Status: DC | PRN
  Administered 2016-10-25 (×3): 2 mg via INTRAVENOUS
  Filled 2016-10-25 (×3): qty 2

## 2016-10-25 MED ORDER — MIDAZOLAM HCL 2 MG/2ML IJ SOLN
INTRAMUSCULAR | Status: AC
  Filled 2016-10-25: qty 4

## 2016-10-25 MED ORDER — CHLORHEXIDINE GLUCONATE 0.12% ORAL RINSE (MEDLINE KIT)
15.0000 mL | Freq: Two times a day (BID) | OROMUCOSAL | Status: DC
  Administered 2016-10-25: 15 mL via OROMUCOSAL

## 2016-10-25 MED ORDER — PANTOPRAZOLE SODIUM 40 MG PO PACK: 40.0000 mg | PACK | Freq: Every day | ORAL | Status: DC

## 2016-10-25 MED ORDER — GLYCOPYRROLATE 0.2 MG/ML IJ SOLN: 0.2000 mg | INTRAMUSCULAR | Status: DC | PRN

## 2016-10-25 MED ORDER — GLYCOPYRROLATE 0.2 MG/ML IJ SOLN
0.2000 mg | INTRAMUSCULAR | Status: DC | PRN
  Administered 2016-10-26: 0.2 mg via INTRAVENOUS
  Filled 2016-10-25: qty 1

## 2016-10-25 MED ORDER — FENTANYL BOLUS VIA INFUSION
100.0000 ug | INTRAVENOUS | Status: DC | PRN
  Administered 2016-10-25 (×3): 100 ug via INTRAVENOUS
  Filled 2016-10-25: qty 100

## 2016-10-25 MED ORDER — VANCOMYCIN HCL IN DEXTROSE 750-5 MG/150ML-% IV SOLN
750.0000 mg | Freq: Two times a day (BID) | INTRAVENOUS | Status: DC
  Filled 2016-10-25: qty 150

## 2016-10-25 MED ORDER — DOCUSATE SODIUM 50 MG/5ML PO LIQD: 100.0000 mg | Freq: Two times a day (BID) | ORAL | Status: DC | PRN

## 2016-10-25 MED ORDER — DEXTROSE 5 % IV SOLN
1.0000 g | Freq: Two times a day (BID) | INTRAVENOUS | Status: DC
  Filled 2016-10-25: qty 1

## 2016-10-25 MED ORDER — SODIUM CHLORIDE 0.9% FLUSH: 10.0000 mL | Freq: Two times a day (BID) | INTRAVENOUS | Status: DC

## 2016-10-25 MED ORDER — CHLORHEXIDINE GLUCONATE CLOTH 2 % EX PADS
6.0000 | MEDICATED_PAD | Freq: Every day | CUTANEOUS | Status: DC
  Administered 2016-10-25: 6 via TOPICAL

## 2016-10-25 MED ORDER — ACETAMINOPHEN 325 MG PO TABS: 650.0000 mg | ORAL_TABLET | Freq: Four times a day (QID) | ORAL | Status: DC | PRN

## 2016-10-25 MED ORDER — FENTANYL 2500MCG IN NS 250ML (10MCG/ML) PREMIX INFUSION
25.0000 ug/h | INTRAVENOUS | Status: DC
  Administered 2016-10-25: 350 ug/h via INTRAVENOUS
  Administered 2016-10-25: 500 ug/h via INTRAVENOUS
  Administered 2016-10-25: 50 ug/h via INTRAVENOUS
  Administered 2016-10-26 (×2): 500 ug/h via INTRAVENOUS
  Filled 2016-10-25 (×5): qty 250

## 2016-10-25 MED ORDER — POLYVINYL ALCOHOL 1.4 % OP SOLN
1.0000 [drp] | Freq: Four times a day (QID) | OPHTHALMIC | Status: DC | PRN
  Filled 2016-10-25: qty 15

## 2016-10-25 NOTE — Procedures (Signed)
Endotracheal Intubation Procedure Note  Indication for endotracheal intubation: airway compromise and respiratory failure. Airway Assessment: Mallampati Class: I (soft palate, uvula, fauces, and tonsillar pillars visible). Sedation: midazolam. Paralytic: rocuronium. Lidocaine: no. Atropine: no. Equipment: Macintosh 3 laryngoscope blade and 7.69mm cuffed endotracheal tube. Cricoid Pressure: no. Number of attempts: 1. ETT location confirmed by by auscultation and ETCO2 monitor  CXR pending.  Rise Paganini Scatliffe 10/25/2016

## 2016-10-25 NOTE — Progress Notes (Signed)
Patient ID: Haley Graham, female   DOB: 1955/02/29, 61 y.o.   MRN: [id number]  This NP was present for conversation with Dr Titus Mould  and patient's husband and daughter and several family members.    Her overall poor prognosis and recommendation for liberation from ventilator was detailed.  I stayed with family and created space and opportunity to discuss and process current medical situation.   It is hard for the patient's husband to accept that medicine has no further viable options to prolong quality of life for [name],  it has been many months of an uphill battle.  Today both patient's husband and daughter are able to verbalize and accept that time is limited and that the focus of care should be on comfort.  Plan to liberate patient from ventilator when family is ready.  - Withdrawal of life order set put in place - Fentanyl continuous drip with boluses - prn doses of Versed  - Robinul for terminal secretions - No further life-prolonging interventions.  Emotional support offered, declined chaplain services  Discussed with Dr Kathreen Cosier  Time in 1200   Time out 1300  Total time spent on the unit was 60 minutes. Greater than 50% of the time was spent in counseling and coordination of care.   Wadie Lessen NP  Palliative Medicine Team Team Phone # [phone] Pager [phone]

## 2016-10-25 NOTE — Progress Notes (Signed)
Patient extubated, family present at the bedside.

## 2016-10-25 NOTE — Procedures (Signed)
Extubation Procedure Note  Patient Details:   Name: SMRITHI PIGFORD DOB: 29-May-1955 MRN: [id number]   Airway Documentation:     Evaluation  O2 sats: currently acceptable Complications: No apparent complications Patient did tolerate procedure well. Bilateral Breath Sounds: Rhonchi   No   Terminal extubation.  Family present.  [place] Amber Ryden Wainer 10/25/2016, 1:52 PM

## 2016-10-25 NOTE — Progress Notes (Signed)
[facility] Progress Note Patient Name: Haley Graham DOB: April 22, 1955 MRN: [id number]   Date of Service  10/25/2016  HPI/Events of Note  Agitation - Request for Fentanyl and Versed bolus orders. Patient is allergic to Ativan.   eICU Interventions  Will order: 1. Fentanyl 25-100 mcg IV Q 2 hours PRN.      Intervention Category Minor Interventions: Agitation / anxiety - evaluation and management  Sommer,Steven Cornelia Copa 10/25/2016, 5:43 AM

## 2016-10-25 NOTE — Progress Notes (Signed)
PHARMACY - ADULT TOTAL PARENTERAL NUTRITION CONSULT NOTE   Pharmacy Consult:  TPN Indication:  PCM  Patient Measurements: Height: 5' 3" (160 cm) Weight: 169 lb 9.6 oz (76.9 kg) IBW/kg (Calculated) : 52.4 TPN AdjBW (KG): 55.7 Body mass index is 30.04 kg/m.  Assessment:  61 YOF with PMH of type B aortic dissection, DM, gastroparesis, GERD, HTN, IBD who presented with enteritis.  Found to have fungal bacteremia with plans to be on fluconazole through 10/28/16.  On 10/12/16 patient developed N/V and KUB showed concern for SBO; confirmed by CT.  Patient is severely malnourished and family/patient is refusing cortrak placement for TF.  Pharmacy consulted to manage TPN for nutritional support.  GI: Concern for ileus but seems to be resolving. Pepcid to be added to TPN. Albumin low at 1.3. Preablumin low at 14.8. Last BM 9/21. Abdominal xray on 9/21 shows decrease in gaseous dilatation of bowel. Boost Breeze and Ensure Enlive ordered but not taking due to NPO status. Surgery signing off and recommending GI consult. NG tube output significantly lower at 150ml. Patient refused all treatment including TPN last night, then later became unresponsive and had to be intubated and transferred to ICU. TPN started around midnight but then discarded later. Currently just on D51/2NS at 50ml/hr Endo: No hx DM. CBGs more controlled yesterday on SSI and TPN insulin Insulin requirements in the past 24 hours: 20 units of SSI and 15 units in TPN Lytes: wnl today exc Na 132. K at 4.5. Mag 2.1. CoCa . Renal: SCr stable, BUN wnl Pulm: Intubated on 9/23 Cards: BP improving, HR controlled - ASA, IV metoprolol.  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  TG elevated but trending down to 277.  Neuro: A&O - Xanax, Vyvanse, scopolamine patch, PRN APAP ID: Eraxis/Flagyl/CTX for fungal bacteremia + IAA. Was switched to Eraxis this admit. Afebrile, WBC up to 12.5  today TPN Access: PICC placed 10/15/16 TPN start date: 10/19/16  Nutritional Goals (per RD recommendation on 9/17): KCal: 2000 - 2200 / day Protein: 110-120 g  Current Nutrition:  NPO TPN  Plan:  May be transitioning to comfort care soon  Will hold TPN for now and d/c all orders and labs Rx will sign off    , PharmD, BCPS Clinical Pharmacist Pager [phone] 10/25/2016 7:44 AM    

## 2016-10-25 NOTE — Progress Notes (Addendum)
Called by RN about patient having acute respiratory distress on BIPAP and frequent episodes of bradycardia  and pauses, RN did page MD prior to my arrival.  Upon arrival, patient was very anxious, RR in the 50s, appeared in distress on the BIPAP, hypoxia on BIPAP, I instructed RN to call MD again and request that MD can evaluate patient urgently, I also called RT to the bedside.  Lung sounds very diminished, poor air movement bilaterally, while assessing patient, patient had another pause, eyes rolled back in the her, briefly unresponsive and then HR improved, patient has been in the AFIB prior to this. SBP maintained throughout.  Patient was acute distress, TRH MD at bedside, requested CCM to be consulted, I spoke with [facility] MD, prepared patient for emergent intubation, CCM MD inubated patient and patient was uregntly taken to 2H02.   Family at bedside update by providers  Start Time 210 End Time 330

## 2016-10-25 NOTE — Consult Note (Signed)
Reason for Consult: bradycardia   Requesting Physician/Service: [facility] Zigmund Daniel)   PCP:  Glenda Chroman, MD Primary Cardiologist:New to [place]  HPI:   Haley Graham is a 61 y.o. female with a hx  descending thoracic aortic stent graft placement of [place] type B aortic dissection, DM, HTN, IBS, gastroparesis  and GERD for who is being seen today for the evaluation of bradycardia/pauses.  Recently admitted 5/19-7/21 Stanford type B aortic dissection status post descending thoracic aortic stent graft placement 6/12. 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   She was previously consulted on earlier by cardiology during her long admission for yeast UTI, weakness/poor po intake.  She was found to have fungemia and has been treated with IV anti-fungals.  She was noted to have a newly reduced EF,  non-ischemic with non-obstructive CAD on cath.    Her course has been complicated by severe protein/caloriue malnutrition, bilateral upper extremity DVTs, and recently a small bowel obstruction. There is a note by nursing that the patient removed her NGT due to not wanting further medical care.    Tonight, on my tele review, she began having several episodes of vagal-mediated sinus pauses progressively longer in duration overnight (5s, 10s) resulting in some respiratory distress.  This culminated in a 20s vagal-mediated pause/period of asystole after which she was intubated. Of note, she had been receiving IV 10 mg of metoprolol, last dose 12:42 AM.    Previous Cardiac Studies: 10/15/2016 Cath - 1. 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 Normal LVEDP  10/14/16 ECHO Study Conclusions  - Left ventricle: The cavity  size was normal. There was mild   concentric hypertrophy. Systolic function was moderately reduced.   The estimated ejection fraction was in the range of 35% to 40%.   There is akinesis of the mid-apicalanteroseptal, anterior,   anterolateral, lateral, and apical myocardium. The study is not   technically sufficient to allow evaluation of LV diastolic   function. - Mitral valve: Calcified annulus.   06/21/16 ECHO Study Conclusions  - Left ventricle: The cavity size was mildly reduced. There was   mild concentric hypertrophy. Systolic function was hyperdynamic.   The estimated ejection fraction was in the range of 70% to 80%.   Wall motion was normal; there were no regional wall motion   abnormalities. Doppler parameters are consistent with abnormal   left ventricular relaxation (grade 1 diastolic dysfunction). - Mitral valve: Calcified annulus. - Pericardium, extracardiac: A trivial pericardial effusion was   identified.  Past Medical History:  Diagnosis Date  . Anxiety   . Arthritis   . Diabetes mellitus without complication ([place])   . Gastroparesis   . GERD (gastroesophageal reflux disease)   . Hypertension   . IBS (irritable bowel syndrome)     Past Surgical History:  Procedure Laterality Date  . ABDOMINAL HYSTERECTOMY    . AORTOGRAM N/A 07/24/2016   Procedure: [facility] AND ABDOMINAL AORTA  ANGIOGRAM;  Surgeon: Waynetta Sandy, MD;  Location: [facility];  Service: Vascular;  Laterality: N/A;  . CESAREAN SECTION    . CHOLECYSTECTOMY    . COLONOSCOPY    . COLONOSCOPY N/A 06/10/2016   Procedure: COLONOSCOPY;  Surgeon: Rogene Houston, MD;  Location: [facility];  Service: Endoscopy;  Laterality: N/A;  8:30  . IR THORACENTESIS ASP PLEURAL SPACE W/IMG GUIDE  09/18/2016  . LEFT HEART CATH AND CORONARY ANGIOGRAPHY N/A 10/19/2016   Procedure: LEFT HEART CATH AND CORONARY ANGIOGRAPHY;  Surgeon: Sherren Mocha, MD;  Location: [facility];  Service: Cardiovascular;   Laterality: N/A;  . POLYPECTOMY  06/10/2016   Procedure: POLYPECTOMY;  Surgeon: Rogene Houston, MD;  Location: [facility];  Service: Endoscopy;;  multiple colon  . THORACIC AORTIC ENDOVASCULAR STENT GRAFT N/A 08/06/2016   Procedure: THORACIC AORTIC ENDOVASCULAR STENT GRAFT/Thorasic and Abdominal Angiogram, Entravascular ultrasound., Open femoral exposure,Left Brachial access, and patch angioplasty right femoral artery.;  Surgeon: Serafina Mitchell, MD;  Location: [facility] OR;  Service: Vascular;  Laterality: N/A;  . UPPER GASTROINTESTINAL ENDOSCOPY      Family History  Problem Relation Age of Onset  . Cancer Mother   . Stroke Mother   . Heart attack Mother   . Stroke Father    Social History:  reports that she has been smoking Cigarettes.  She has a 17.25 pack-year smoking history. She has never used smokeless tobacco. She reports that she does not drink alcohol or use drugs.  Allergies:  Allergies  Allergen Reactions  . Penicillins Rash and Other (See Comments)    PATIENT HAS HAD A PCN REACTION WITH IMMEDIATE RASH, FACIAL/TONGUE/THROAT SWELLING, SOB, OR LIGHTHEADEDNESS WITH HYPOTENSION:  #  #  #  YES  #  #  #   Has patient had a PCN reaction causing severe rash involving mucus membranes or skin necrosis: no Has patient had a PCN reaction that required hospitalization no Has patient had a PCN reaction occurring within the last 10 years:no If all of the above answers are "NO", then may proceed with Cephalosporin use.  . Ativan [Lorazepam] Other (See Comments)    Makes pt very confused and more agitated, irritable.  . Codeine Nausea And Vomiting  . Reglan [Metoclopramide] Other (See Comments)    TACHYCARDIA    No current facility-administered medications on file prior to encounter.    Current Outpatient Prescriptions on File Prior to Encounter  Medication Sig Dispense Refill  . ALPRAZolam (XANAX) 0.5 MG tablet Take 0.5-1 tablets (0.25-0.5 mg total) by mouth 2 (two) times daily as needed  for anxiety. 30 tablet 0  . aspirin EC 81 MG EC tablet Take 1 tablet (81 mg total) by mouth daily.    . citalopram (CELEXA) 40 MG tablet Take 40 mg by mouth at bedtime.     . clonazePAM (KLONOPIN) 1 MG tablet Take 1 tablet (1 mg total) by mouth at bedtime. (Patient taking differently: Take 1 mg by mouth at bedtime as needed (for sleep). ) 30 tablet 0  . fluticasone (FLONASE) 50 MCG/ACT nasal spray Place 2 sprays into both nostrils daily as needed for allergies or rhinitis.   11  . glipiZIDE-metformin (METAGLIP) 5-500 MG tablet Take 0.5 tablets by mouth 2 (two) times daily before a meal. 60 tablet 1  . mometasone-formoterol (DULERA) 200-5 MCG/ACT AERO Inhale 2 puffs into the lungs 2 (two) times daily. 1 Inhaler 1  . pantoprazole (PROTONIX) 40 MG tablet Take 40 mg by mouth daily as needed (acid reflux/ indigestion).     . Probiotic Product (PROBIOTIC PO) Take 1 tablet by mouth at bedtime. Gut Restore Ultimate (probiotic with zinc and vitamin C)    . traMADol (ULTRAM) 50 MG tablet Take 1 tablet (50 mg total) by mouth every 6 (six) hours as  needed. (Patient taking differently: Take 25 mg by mouth every 6 (six) hours as needed for moderate pain. ) 30 tablet 0  . Metoprolol Tartrate 37.5 MG TABS Take 37.5 mg by mouth 2 (two) times daily. (Patient not taking: Reported on 09/30/2016) 60 tablet 1  . oxyCODONE 10 MG TABS Take 1 tablet (10 mg total) by mouth 2 (two) times daily as needed for severe pain. (Patient not taking: Reported on 09/25/2016) 30 tablet 0     Results for orders placed or performed during the hospital encounter of 09/27/2016 (from the past 48 hour(s))  Magnesium     Status: None   Collection Time: 10/23/16  4:54 AM  Result Value Ref Range   Magnesium 1.9 1.7 - 2.4 mg/dL  Triglycerides     Status: Abnormal   Collection Time: 10/23/16  4:54 AM  Result Value Ref Range   Triglycerides 277 (H) <150 mg/dL  CBC with Differential/Platelet     Status: Abnormal   Collection Time: 10/23/16  4:54 AM   Result Value Ref Range   WBC 8.2 4.0 - 10.5 K/uL   RBC 2.24 (L) 3.87 - 5.11 MIL/uL   Hemoglobin 7.1 (L) 12.0 - 15.0 g/dL   HCT 22.5 (L) 36.0 - 46.0 %   MCV 100.4 (H) 78.0 - 100.0 fL   MCH 31.7 26.0 - 34.0 pg   MCHC 31.6 30.0 - 36.0 g/dL   RDW 20.8 (H) 11.5 - 15.5 %   Platelets 99 (L) 150 - 400 K/uL    Comment: PLATELET COUNT CONFIRMED BY SMEAR   Neutrophils Relative % 67 %   Neutro Abs 5.5 1.7 - 7.7 K/uL   Lymphocytes Relative 21 %   Lymphs Abs 1.7 0.7 - 4.0 K/uL   Monocytes Relative 11 %   Monocytes Absolute 0.9 0.1 - 1.0 K/uL   Eosinophils Relative 1 %   Eosinophils Absolute 0.0 0.0 - 0.7 K/uL   Basophils Relative 0 %   Basophils Absolute 0.0 0.0 - 0.1 K/uL   WBC Morphology MILD LEFT SHIFT (1-5% METAS, OCC MYELO, OCC BANDS)    RBC Morphology POLYCHROMASIA PRESENT   Comprehensive metabolic panel     Status: Abnormal   Collection Time: 10/23/16  4:54 AM  Result Value Ref Range   Sodium 133 (L) 135 - 145 mmol/L   Potassium 3.4 (L) 3.5 - 5.1 mmol/L   Chloride 103 101 - 111 mmol/L   CO2 27 22 - 32 mmol/L   Glucose, Bld 150 (H) 65 - 99 mg/dL   BUN 14 6 - 20 mg/dL   Creatinine, Ser 0.53 0.44 - 1.00 mg/dL   Calcium 7.2 (L) 8.9 - 10.3 mg/dL   Total Protein 4.2 (L) 6.5 - 8.1 g/dL   Albumin 1.3 (L) 3.5 - 5.0 g/dL   AST 15 15 - 41 U/L   ALT 9 (L) 14 - 54 U/L   Alkaline Phosphatase 385 (H) 38 - 126 U/L   Total Bilirubin 0.5 0.3 - 1.2 mg/dL   GFR calc non Af Amer >60 >60 mL/min   GFR calc Af Amer >60 >60 mL/min    Comment: (NOTE) The eGFR has been calculated using the CKD EPI equation. This calculation has not been validated in all clinical situations. eGFR's persistently <60 mL/min signify possible Chronic Kidney Disease.    Anion gap 3 (L) 5 - 15  Glucose, capillary     Status: Abnormal   Collection Time: 10/23/16  5:31 AM  Result Value Ref Range  Glucose-Capillary 168 (H) 65 - 99 mg/dL  Glucose, capillary     Status: Abnormal   Collection Time: 10/23/16  9:41 AM    Result Value Ref Range   Glucose-Capillary 206 (H) 65 - 99 mg/dL  Glucose, capillary     Status: Abnormal   Collection Time: 10/23/16 11:55 AM  Result Value Ref Range   Glucose-Capillary 202 (H) 65 - 99 mg/dL   Comment 1 Notify RN    Comment 2 Document in Chart   Glucose, capillary     Status: Abnormal   Collection Time: 10/23/16  4:30 PM  Result Value Ref Range   Glucose-Capillary 179 (H) 65 - 99 mg/dL  Glucose, capillary     Status: Abnormal   Collection Time: 10/23/16  8:28 PM  Result Value Ref Range   Glucose-Capillary 173 (H) 65 - 99 mg/dL  Heparin level (unfractionated)     Status: None   Collection Time: 10/23/16  9:18 PM  Result Value Ref Range   Heparin Unfractionated 0.33 0.30 - 0.70 IU/mL    Comment:        IF HEPARIN RESULTS ARE BELOW EXPECTED VALUES, AND PATIENT DOSAGE HAS BEEN CONFIRMED, SUGGEST FOLLOW UP TESTING OF ANTITHROMBIN III LEVELS.   Glucose, capillary     Status: Abnormal   Collection Time: 10/23/16 11:28 PM  Result Value Ref Range   Glucose-Capillary 210 (H) 65 - 99 mg/dL  Magnesium     Status: None   Collection Time: 10/24/16  3:53 AM  Result Value Ref Range   Magnesium 1.9 1.7 - 2.4 mg/dL  Basic metabolic panel     Status: Abnormal   Collection Time: 10/24/16  3:53 AM  Result Value Ref Range   Sodium 132 (L) 135 - 145 mmol/L   Potassium 4.5 3.5 - 5.1 mmol/L    Comment: DELTA CHECK NOTED   Chloride 104 101 - 111 mmol/L   CO2 23 22 - 32 mmol/L   Glucose, Bld 189 (H) 65 - 99 mg/dL   BUN 15 6 - 20 mg/dL   Creatinine, Ser 0.50 0.44 - 1.00 mg/dL   Calcium 7.5 (L) 8.9 - 10.3 mg/dL   GFR calc non Af Amer >60 >60 mL/min   GFR calc Af Amer >60 >60 mL/min    Comment: (NOTE) The eGFR has been calculated using the CKD EPI equation. This calculation has not been validated in all clinical situations. eGFR's persistently <60 mL/min signify possible Chronic Kidney Disease.    Anion gap 5 5 - 15  CBC with Differential/Platelet     Status: Abnormal    Collection Time: 10/24/16  3:53 AM  Result Value Ref Range   WBC 12.5 (H) 4.0 - 10.5 K/uL   RBC 2.23 (L) 3.87 - 5.11 MIL/uL   Hemoglobin 7.2 (L) 12.0 - 15.0 g/dL   HCT 22.7 (L) 36.0 - 46.0 %   MCV 101.8 (H) 78.0 - 100.0 fL   MCH 32.3 26.0 - 34.0 pg   MCHC 31.7 30.0 - 36.0 g/dL   RDW 21.5 (H) 11.5 - 15.5 %   Platelets 81 (L) 150 - 400 K/uL    Comment: REPEATED TO VERIFY CONSISTENT WITH PREVIOUS RESULT    Neutrophils Relative % 75 %   Lymphocytes Relative 18 %   Monocytes Relative 7 %   Eosinophils Relative 0 %   Basophils Relative 0 %   Neutro Abs 9.3 (H) 1.7 - 7.7 K/uL   Lymphs Abs 2.3 0.7 - 4.0 K/uL   Monocytes  Absolute 0.9 0.1 - 1.0 K/uL   Eosinophils Absolute 0.0 0.0 - 0.7 K/uL   Basophils Absolute 0.0 0.0 - 0.1 K/uL   RBC Morphology POLYCHROMASIA PRESENT    WBC Morphology MILD LEFT SHIFT (1-5% METAS, OCC MYELO, OCC BANDS)   Heparin level (unfractionated)     Status: Abnormal   Collection Time: 10/24/16  3:53 AM  Result Value Ref Range   Heparin Unfractionated 0.17 (L) 0.30 - 0.70 IU/mL    Comment:        IF HEPARIN RESULTS ARE BELOW EXPECTED VALUES, AND PATIENT DOSAGE HAS BEEN CONFIRMED, SUGGEST FOLLOW UP TESTING OF ANTITHROMBIN III LEVELS.   Glucose, capillary     Status: Abnormal   Collection Time: 10/24/16  4:17 AM  Result Value Ref Range   Glucose-Capillary 198 (H) 65 - 99 mg/dL  Glucose, capillary     Status: Abnormal   Collection Time: 10/24/16 11:54 AM  Result Value Ref Range   Glucose-Capillary 207 (H) 65 - 99 mg/dL  CBC     Status: Abnormal   Collection Time: 10/24/16  9:43 PM  Result Value Ref Range   WBC 17.4 (H) 4.0 - 10.5 K/uL   RBC 2.27 (L) 3.87 - 5.11 MIL/uL   Hemoglobin 7.3 (L) 12.0 - 15.0 g/dL   HCT 23.7 (L) 36.0 - 46.0 %   MCV 104.4 (H) 78.0 - 100.0 fL   MCH 32.2 26.0 - 34.0 pg   MCHC 30.8 30.0 - 36.0 g/dL   RDW 21.3 (H) 11.5 - 15.5 %   Platelets 100 (L) 150 - 400 K/uL    Comment: CONSISTENT WITH PREVIOUS RESULT   Dg Chest 1  View  Result Date: 10/24/2016 CLINICAL DATA:  Hypoxia EXAM: CHEST 1 VIEW COMPARISON:  10/17/2016 FINDINGS: Right-sided PICC line is again identified and stable. A nasogastric catheter is now seen in the stomach. Thoracic stent graft is again noted and stable. Cardiac shadow is stable. Bibasilar atelectasis and small effusions are seen. No new focal abnormality is noted. IMPRESSION: Interval placement of nasogastric catheter. The remainder of the exam is stable. Electronically Signed   By: Inez Catalina M.D.   On: 10/24/2016 09:17   Dg Abd 1 View  Result Date: 10/24/2016 CLINICAL DATA:  Abdominal pain. EXAM: ABDOMEN - 1 VIEW COMPARISON:  10/23/2016 FINDINGS: Nasogastric tube extends into the abdomen. The catheter tip is likely in the duodenum. Gas in the stomach and throughout the bowel loops in the abdomen and pelvis. Stent graft again noted in the lower thoracic aorta. Evidence for cholecystectomy clips. Stable consolidation or densities at left lung base. Limited evaluation for free air on this supine view. IMPRESSION: Stable position of nasogastric tube, likely in the duodenum. No significant change in the bowel gas pattern. Scattered gas-filled loops of bowel throughout the abdomen and upper pelvis. Persistent densities at left lung base. Electronically Signed   By: Markus Daft M.D.   On: 10/24/2016 09:23   Dg Abd Portable 1v  Result Date: 10/23/2016 CLINICAL DATA:  NG tube placement EXAM: PORTABLE ABDOMEN - 1 VIEW COMPARISON:  10/23/2016 FINDINGS: Partially visualized aortic stent. Surgical clips in the right upper quadrant. Esophageal tube tip overlies the gastroduodenal junction. Slight decreased gaseous enlargement of bowel. IMPRESSION: 1. Esophageal tube tip overlies the gastroduodenal junction 2. Slight decreased gaseous dilatation of bowel 3. Atelectasis or infiltrate at the left lung base Electronically Signed   By: Donavan Foil M.D.   On: 10/23/2016 19:58   Dg Abd Portable 1v  Result  Date:  10/23/2016 CLINICAL DATA:  Abdominal pain. EXAM: PORTABLE ABDOMEN - 1 VIEW COMPARISON:  10/22/2016. FINDINGS: NG tube in stable position. Prior cholecystectomy . Persistent dilated loops of bowel some of which may represent small bowel again noted without interim change. Persistent bowel wall thickening consistent with bowel edema again noted. No free air. IMPRESSION: 1. NG tube in stable position. 2. Persistent dilated loops of bowel some of which may represent small bowel again noted without interim change. Persistent bowel wall thickening consistent with bowel wall edema again noted. An active process such as enteritis could present this fashion. Reference is made to CT report of 10/22/2016. Electronically Signed   By: Marcello Moores  Register   On: 10/23/2016 08:01    ECG/TELE: she began having several episodes of vagal-mediated sinus pauses progressively longer in duration overnight (5s, 10s) resulting in some respiratory distress.  This culminated in a 20s vagal-mediated pause/period of asystole after which she was intubated.  ROS: As above. Otherwise, review of systems is negative unless per above HPI  Vitals:   10/24/16 2000 10/24/16 2006 10/24/16 2310 10/25/16 0045  BP: (!) 172/90   (!) 154/86  Pulse:  (!) 116 (!) 115   Resp:  (!) 32 (!) 28 (!) 21  Temp:      TempSrc:      SpO2: 100% 97% 98% 98%  Weight:      Height:       Wt Readings from Last 10 Encounters:  10/24/16 76.9 kg (169 lb 9.6 oz)  09/29/2016 72.1 kg (159 lb)  08/21/16 72.4 kg (159 lb 9.8 oz)  06/10/16 82.1 kg (181 lb)  05/27/16 84.6 kg (186 lb 6.4 oz)  03/28/14 78.8 kg (173 lb 12.8 oz)  02/14/14 77.1 kg (170 lb)  12/19/11 68 kg (150 lb)  11/19/11 73.3 kg (161 lb 8 oz)  11/12/11 74.4 kg (164 lb)    PE:  General: intubated, critically ill HEENT: No JVD at 45 degrees. No HJR. CV: Tachycardic, regular, no murmurs/gallops  Respiratory: Vented. Normal work of breathing ABD: Distended and non-tender. No palpable organomegaly.   Extremities: 2+ radial pulses bilaterally. 0 edema. Neuro/Psych: sedated  Assessment/Plan Vagal-mediated pauses; 20s of asystole Non-ischemic cardiomyoapthy HTN Fungemia Small Bowel Obstruction requiring NG tube Respiratory failure  She began having several episodes of vagal-mediated sinus pauses progressively longer in duration overnight (5s, 10s) resulting in some respiratory distress.  This culminated in a 20s vagal-mediated pause/period of asystole after which she was intubated. This is in the setting of getting 10 mg IV q 6 metoprolol for sinus tachycardia.  Some of this may be exacerbated by respiratory failure on bipap and/or her removing her NG tube and becoming more distended.  Going forward, recommend having atropine at bedside for pauses and avoiding AV nodal blocking agents at least overnight until this improves.  Currently heart rate ~100s without any episodes of pauses or bradycardia.     Lolita Cram Means  MD 10/25/2016, 2:56 AM

## 2016-10-25 NOTE — Consult Note (Addendum)
Cardiology Consultation:   Patient ID: Haley Graham; [id number]; 04-Nov-1955   Admit date: 09/23/2016 Date of Consult: 10/25/2016  Primary Care Provider: Glenda Chroman, MD Primary Cardiologist: [place] Primary Electrophysiologist:  none   Patient Profile:   Haley Graham is a 61 y.o. female with a hx of thoracic aortic dissection who is being seen today for the evaluation of profound bradycardia at the request of Dr. Rodena Piety.  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 Complicated by mesenteric and renal ischemia She has long-standing diabetes. She was admitted in August with fungemia and additional GI difficulties and has developed left ventricular dysfunction. She is known to have protein calorie malnutrition and prior bowel obstruction. The patient was admitted to the hospital with progressive weakness and failure to thrive, and has subsequently developed respiratory failure requiring intubation in conjunction with long pauses of up to 20 seconds or more. She has been intubated and is intermittently awake, and when she fights the ventilator, her pauses appear worse.  Past Medical History:  Diagnosis Date  . Anxiety   . Arthritis   . Diabetes mellitus without complication ([place])   . Gastroparesis   . GERD (gastroesophageal reflux disease)   . Hypertension   . IBS (irritable bowel syndrome)     Past Surgical History:  Procedure Laterality Date  . ABDOMINAL HYSTERECTOMY    . AORTOGRAM N/A 07/24/2016   Procedure: [facility] AND ABDOMINAL AORTA  ANGIOGRAM;  Surgeon: Waynetta Sandy, MD;  Location: [place];  Service: Vascular;  Laterality: N/A;  . CESAREAN SECTION    . CHOLECYSTECTOMY    . COLONOSCOPY    . COLONOSCOPY N/A 06/10/2016   Procedure: COLONOSCOPY;  Surgeon: Rogene Houston, MD;  [facility];  Service: Endoscopy;  Laterality: N/A;  8:30  . IR  THORACENTESIS ASP PLEURAL SPACE W/IMG GUIDE  09/18/2016  . LEFT HEART CATH AND CORONARY ANGIOGRAPHY N/A 10/30/2016   Procedure: LEFT HEART CATH AND CORONARY ANGIOGRAPHY;  Surgeon: Sherren Mocha, MD;  Location: [place] [facility];  Service: Cardiovascular;  Laterality: N/A;  . POLYPECTOMY  06/10/2016   Procedure: POLYPECTOMY;  Surgeon: Rogene Houston, MD;  Location: [facility];  Service: Endoscopy;;  multiple colon  . THORACIC AORTIC ENDOVASCULAR STENT GRAFT N/A 08/06/2016   Procedure: THORACIC AORTIC ENDOVASCULAR STENT GRAFT/Thorasic and Abdominal Angiogram, Entravascular ultrasound., Open femoral exposure,Left Brachial access, and patch angioplasty right femoral artery.;  Surgeon: Serafina Mitchell, MD;  Location: [facility];  Service: Vascular;  Laterality: N/A;  . UPPER GASTROINTESTINAL ENDOSCOPY         Inpatient Medications: Scheduled Meds: . aspirin EC  81 mg Oral Daily  . chlorhexidine gluconate (MEDLINE KIT)  15 mL Mouth Rinse BID  . dronabinol  5 mg Oral BID AC  . feeding supplement  1 Container Oral Q24H  . feeding supplement (ENSURE ENLIVE)  237 mL Oral TID BM  . insulin aspart  2-6 Units Subcutaneous Q4H  . lisdexamfetamine  20 mg Oral Daily  . mouth rinse  15 mL Mouth Rinse QID  . midazolam      . pantoprazole sodium  40 mg Per Tube Q1200  . scopolamine  1 patch Transdermal Q72H  . sodium chloride flush  10-40 mL Intracatheter Q12H   Continuous Infusions: . anidulafungin Stopped (10/24/16 1948)  . cefTRIAXone (ROCEPHIN)  IV Stopped (10/24/16 1435)  .  dextrose 5 % and 0.45% NaCl 50 mL/hr at 10/25/16 0323  . fentaNYL infusion INTRAVENOUS 150 mcg/hr (10/25/16 0747)  . metronidazole Stopped (10/25/16 0754)   PRN Meds: acetaminophen, alum & mag hydroxide-simeth, docusate, fentaNYL (SUBLIMAZE) injection, hydrALAZINE, hydrOXYzine, ipratropium-albuterol, lidocaine, ondansetron, promethazine, senna-docusate, sodium chloride flush, sodium phosphate  Allergies:    Allergies    Allergen Reactions  . Penicillins Rash and Other (See Comments)    PATIENT HAS HAD A PCN REACTION WITH IMMEDIATE RASH, FACIAL/TONGUE/THROAT SWELLING, SOB, OR LIGHTHEADEDNESS WITH HYPOTENSION:  #  #  #  YES  #  #  #   Has patient had a PCN reaction causing severe rash involving mucus membranes or skin necrosis: no Has patient had a PCN reaction that required hospitalization no Has patient had a PCN reaction occurring within the last 10 years:no If all of the above answers are "NO", then may proceed with Cephalosporin use.  . Ativan [Lorazepam] Other (See Comments)    Makes pt very confused and more agitated, irritable.  . Codeine Nausea And Vomiting  . Reglan [Metoclopramide] Other (See Comments)    TACHYCARDIA    Social History:   Social History   Social History  . Marital status: Married    Spouse name: N/A  . Number of children: N/A  . Years of education: N/A   Occupational History  . Not on file.   Social History Main Topics  . Smoking status: Current Every Day Smoker    Packs/day: 0.75    Years: 23.00    Types: Cigarettes  . Smokeless tobacco: Never Used     Comment: 5-6 cigarettes. Cut back about 3 weeks ago. Smoking since age 38.  Marland Kitchen Alcohol use No  . Drug use: No  . Sexual activity: Not on file   Other Topics Concern  . Not on file   Social History Narrative  . No narrative on file    Family History:    Family History  Problem Relation Age of Onset  . Cancer Mother   . Stroke Mother   . Heart attack Mother   . Stroke Father      ROS:  Please see the history of present illness.  ROS  All other ROS reviewed and negative.  The patient cannot provide any additional review of systems, according to her family she has had generalized weakness and fatigue.   Physical Exam/Data:   Vitals:   10/25/16 0827 10/25/16 0830 10/25/16 0845 10/25/16 0900  BP:  (!) 161/72 (!) 153/68 (!) 165/90  Pulse:    96  Resp:  (!) 26 (!) 26 (!) 26  Temp:      TempSrc:       SpO2: 99%   98%  Weight:      Height:        Intake/Output Summary (Last 24 hours) at 10/25/16 0923 Last data filed at 10/25/16 0700  Gross per 24 hour  Intake          1873.98 ml  Output              900 ml  Net           973.98 ml   Filed Weights   10/22/16 0530 10/23/16 0530 10/24/16 0500  Weight: 161 lb 6.4 oz (73.2 kg) 166 lb 14.4 oz (75.7 kg) 169 lb 9.6 oz (76.9 kg)   Body mass index is 30.04 kg/m.  General:  Chronically ill-appearing with endotracheal tube in place HEENT: normalexcept as noted above  Lymph: no adenopathy Neck: 8 cm JVD Endocrine:  No thryomegaly Cardiac:  normal S1, S2; IRRR; no murmur, heart sounds a bit distant Lungs:  Scattered rales and rhonchi are present. No obvious wheezing Abd: soft, nontender, no hepatomegaly  Ext: upper extremities are swollen and edematous with ecchymotic areas on the skin Musculoskeletal:  No deformities, BUE and BLE strength normal and equal Skin: warm and dry  Neuro:  Response to verbal stimuli but no obvious response to commands EKG:  The EKG was personally reviewed and demonstrates:  Sinus tachycardia, nonspecific ST-T wave abnormality Telemetry:  Telemetry was personally reviewed and demonstrates:  Sinus tachycardia with periods of long pauses and asystole.   Laboratory Data:  Chemistry Recent Labs Lab 10/23/16 0454 10/24/16 0353 10/25/16 0431  NA 133* 132* 134*  K 3.4* 4.5 4.7  CL 103 104 106  CO2 _0 GLUCOSE 150* 189* 157*  BUN _1 CREATININE 0.53 0.50 0.49  CALCIUM 7.2* 7.5* 8.2*  GFRNONAA >60 >60 >60  GFRAA >60 >60 >60  ANIONGAP 3* 5 6     Recent Labs Lab 10/21/16 0446 10/22/16 0427 10/23/16 0454  PROT 4.4* 4.4* 4.2*  ALBUMIN 1.3* 1.3* 1.3*  AST _2 ALT 11* 10* 9*  ALKPHOS 365* 414* 385*  BILITOT 0.5 0.4 0.5   Hematology Recent Labs Lab 10/24/16 0353 10/24/16 2143 10/25/16 0431  WBC 12.5* 17.4* 18.6*  RBC 2.23* 2.27* 2.22*  HGB 7.2* 7.3* 7.3*  HCT 22.7* 23.7*  23.3*  MCV 101.8* 104.4* 105.0*  MCH 32.3 32.2 32.9  MCHC 31.7 30.8 31.3  RDW 21.5* 21.3* 21.4*  PLT 81* 100* 72*   Cardiac EnzymesNo results for input(s): TROPONINI in the last 168 hours. No results for input(s): TROPIPOC in the last 168 hours.  BNPNo results for input(s): BNP, PROBNP in the last 168 hours.  DDimer No results for input(s): DDIMER in the last 168 hours.  Radiology/Studies:  Dg Chest 1 View  Result Date: 10/24/2016 CLINICAL DATA:  Hypoxia EXAM: CHEST 1 VIEW COMPARISON:  10/17/2016 FINDINGS: Right-sided PICC line is again identified and stable. A nasogastric catheter is now seen in the stomach. Thoracic stent graft is again noted and stable. Cardiac shadow is stable. Bibasilar atelectasis and small effusions are seen. No new focal abnormality is noted. IMPRESSION: Interval placement of nasogastric catheter. The remainder of the exam is stable. Electronically Signed   By: Inez Catalina M.D.   On: 10/24/2016 09:17   Dg Abd 1 View  Result Date: 10/24/2016 CLINICAL DATA:  Abdominal pain. EXAM: ABDOMEN - 1 VIEW COMPARISON:  10/23/2016 FINDINGS: Nasogastric tube extends into the abdomen. The catheter tip is likely in the duodenum. Gas in the stomach and throughout the bowel loops in the abdomen and pelvis. Stent graft again noted in the lower thoracic aorta. Evidence for cholecystectomy clips. Stable consolidation or densities at left lung base. Limited evaluation for free air on this supine view. IMPRESSION: Stable position of nasogastric tube, likely in the duodenum. No significant change in the bowel gas pattern. Scattered gas-filled loops of bowel throughout the abdomen and upper pelvis. Persistent densities at left lung base. Electronically Signed   By: Markus Daft M.D.   On: 10/24/2016 09:23   Dg Abd 1 View  Result Date: 10/22/2016 CLINICAL DATA:  Evaluate NG tube placed EXAM: ABDOMEN - 1 VIEW COMPARISON:  CT abdomen and pelvis of 10/22/2016 FINDINGS: The tip of the NG tube is  within the region of  the duodenal bulb. There is some distention of small bowel consistent with partial small bowel obstruction. No colonic distention is seen. Surgical clips are present in the right upper quadrant from prior cholecystectomy. IMPRESSION: Tip of NG tube in region of the duodenal bulb. Persistent partial small bowel obstruction. Electronically Signed   By: Ivar Drape M.D.   On: 10/22/2016 16:05   Ct Abdomen Pelvis W Contrast  Result Date: 10/22/2016 CLINICAL DATA:  Abdominal pain and nausea. Small bowel obstruction. Abdominal aortic dissection. EXAM: CT ABDOMEN AND PELVIS WITH CONTRAST TECHNIQUE: Multidetector CT imaging of the abdomen and pelvis was performed using the standard protocol following bolus administration of intravenous contrast. CONTRAST:  193m ISOVUE-300 IOPAMIDOL (ISOVUE-300) INJECTION 61% COMPARISON:  10/13/2016 FINDINGS: Lower Chest: Moderate bilateral pleural effusions and bibasilar atelectasis show mild increase since previous study. Hepatobiliary: No hepatic masses identified. Focal fatty infiltration again seen adjacent to falciform ligament. Prior cholecystectomy. No evidence of biliary dilatation. Pancreas:  No mass or inflammatory changes. Spleen: Spleen is normal in size. Decreased prominence of small peripheral splenic infarcts since prior study. 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 Left renal parenchymal atrophy and old infarcts are stable. No evidence of renal masses or hydronephrosis. A Foley catheter within the bladder, which is empty. Stomach/Bowel: Nasogastric tube again seen with tip in the antrum. Moderately dilated small bowel loops show no significant change. There is a transition point in the anterior right abdomen with nondilated distal small bowel. This is consistent with a distal small bowel obstruction. Mild enhancement and lack of normal fold pattern of distal small  bowel is again noted. Mild wall thickening again seen involving the ascending, transverse, and descending colon. These findings are suspicious for enterocolitis. Mild ascites is increased since previous study, without evidence of abscess or free air. Vascular/Lymphatic: No pathologically enlarged lymph nodes. No abdominal aortic aneurysm. 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 Reproductive: Prior hysterectomy noted. Adnexal regions are unremarkable in appearance. Other:  None. Musculoskeletal:  No suspicious bone lesions identified. IMPRESSION: Distal small bowel obstruction, without significant change. Mild increase in ascites.  No evidence of abscess or free air. Stable mild wall thickening involving distal small bowel loops and colon, suspicious for enterocolitis. Differential diagnosis includes infectious etiologies, ischemia, and inflammatory bowel disease. Mild increase in bilateral pleural effusions and bibasilar atelectasis. 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 Mild decrease in small splenic infarcts. Stable left renal parenchymal atrophy and old infarcts. Electronically Signed   By: JEarle GellM.D.   On: 10/22/2016 14:55   Dg Chest Port 1 View  Result Date: 10/25/2016 CLINICAL DATA:  Endotracheal tube repositioning EXAM: PORTABLE CHEST 1 VIEW COMPARISON:  Chest radiograph 10/25/2016 FINDINGS: The endotracheal tube tip has been retracted and is 2.6 cm above the inferior margin of the carina. It could be retracted approximately the 3 more cm for optimal positioning at the level of the clavicular heads. Right PICC line tip at cavoatrial junction. Orogastric tube courses below the diaphragm with the tip oriented cranially at the level of the gastroesophageal junction. There are bilateral pleural effusions with unchanged bibasilar consolidation. IMPRESSION: 1. Retraction of endotracheal tube by approximately 1 cm.  This could be retracted further 2-2.5 cm for optimal positioning. 2. Unchanged pleural effusions and bibasilar consolidation. Electronically Signed   By: KUlyses JarredM.D.   On: 10/25/2016 03:25   Dg Chest Port 1  View  Result Date: 10/25/2016 CLINICAL DATA:  Intubated EXAM: PORTABLE CHEST 1 VIEW COMPARISON:  10/24/2016, 10/17/2016, CT chest 09/17/2016, 09/27/2016 FINDINGS: Interval intubation, tip of the endotracheal tube is about 9 mm superior to carina. Esophageal tube tip is below the diaphragm. Right upper extremity catheter tip overlies the distal SVC. Aortic stent graft re- demonstrated. Bilateral pleural effusions, slight increase on the right side. Stable cardiomediastinal silhouette. Continued bibasilar atelectasis or pneumonia. Mild diffuse background interstitial edema. No pneumothorax. IMPRESSION: 1. Interval intubation, tip of the endotracheal tube is about 9 mm superior to carina. 2. Slight interval increase in right pleural effusion. Left pleural effusion and bibasilar atelectasis or infiltrates are otherwise unchanged. Stable cardiomediastinal silhouette with diffuse interstitial opacities suspicious for pulmonary edema Electronically Signed   By: Donavan Foil M.D.   On: 10/25/2016 03:20   Dg Abd Portable 1v  Result Date: 10/25/2016 CLINICAL DATA:  Orogastric tube placement EXAM: PORTABLE ABDOMEN - 1 VIEW COMPARISON:  Abdominal radiograph 10/24/2016 FINDINGS: The side port of the orogastric tube is in the stomach. The tip is oriented cranially and projects near the gastroesophageal junction. There is a right pleural effusion. IMPRESSION: Orogastric tube tip oriented cranially near the gastroesophageal junction. The side port is in the stomach. Electronically Signed   By: Ulyses Jarred M.D.   On: 10/25/2016 03:23   Dg Abd Portable 1v  Result Date: 10/23/2016 CLINICAL DATA:  NG tube placement EXAM: PORTABLE ABDOMEN - 1 VIEW COMPARISON:  10/23/2016 FINDINGS: Partially visualized aortic  stent. Surgical clips in the right upper quadrant. Esophageal tube tip overlies the gastroduodenal junction. Slight decreased gaseous enlargement of bowel. IMPRESSION: 1. Esophageal tube tip overlies the gastroduodenal junction 2. Slight decreased gaseous dilatation of bowel 3. Atelectasis or infiltrate at the left lung base Electronically Signed   By: Donavan Foil M.D.   On: 10/23/2016 19:58   Dg Abd Portable 1v  Result Date: 10/23/2016 CLINICAL DATA:  Abdominal pain. EXAM: PORTABLE ABDOMEN - 1 VIEW COMPARISON:  10/22/2016. FINDINGS: NG tube in stable position. Prior cholecystectomy . Persistent dilated loops of bowel some of which may represent small bowel again noted without interim change. Persistent bowel wall thickening consistent with bowel edema again noted. No free air. IMPRESSION: 1. NG tube in stable position. 2. Persistent dilated loops of bowel some of which may represent small bowel again noted without interim change. Persistent bowel wall thickening consistent with bowel wall edema again noted. An active process such as enteritis could present this fashion. Reference is made to CT report of 10/22/2016. Electronically Signed   By: Marcello Moores  Register   On: 10/23/2016 08:01   Dg Abd Portable 1v  Result Date: 10/22/2016 CLINICAL DATA:  Abdominal pain EXAM: PORTABLE ABDOMEN - 1 VIEW COMPARISON:  Yesterday FINDINGS: Diffuse gaseous distention of small bowel. Moderate gaseous distension of the distal stomach. No noted colonic distention. Haziness of the abdomen with bulging of the flanks and some centralization of small bowel loops. The same appearance was seen on CT scanogram 10/13/2016, when there was no significant ascites. IMPRESSION: No change in preferential small bowel dilatation. Appearance correlates with partial small bowel obstruction seen on 10/13/2016 abdominal CT. Electronically Signed   By: Monte Fantasia M.D.   On: 10/22/2016 10:56   Dg Abd Portable 1v  Result Date:  10/21/2016 CLINICAL DATA:  Abdominal pain EXAM: PORTABLE ABDOMEN - 1 VIEW COMPARISON:  10/18/2016 FINDINGS: Dilated loops of small bowel in the left mid abdomen, suggesting small bowel obstruction. Adynamic small bowel ileus  is also possible. Cholecystectomy clips. Thoracic aortic stent, incompletely visualized. IMPRESSION: Dilated loops of small bowel in the left mid abdomen, suggesting small bowel obstruction. Adynamic small bowel ileus is also possible. Electronically Signed   By: Julian Hy M.D.   On: 10/21/2016 19:51    Assessment and Plan:   1. Ventilatory dependent respiratory failure - she is status post intubation. At this point, I think the patient is actively dying. I suspect she has mesenteric ischemia, and pain and discomfort from the ventilator are resulting in prolonged pauses. I discussed insertion of a temporary pacing wire with the family, but I think this will just prolonged her inevitable course. If she were to dramatically improve, then we could rethink backup pacing. She is not a candidate for permanent pacemaker. I agree with holding all AV nodal blocking drugs 2. Prolonged pauses - I think these are vagally mediated due to pain in conjunction with beta blocker therapy. Her beta blockers have been stopped. No indication for temporary pacing wire secondary to the patient's very poor prognosis 3. Mesenteric ischemia - I strongly suspect that this will be what ultimately results in her demise. Continue supportive care. 4. Family conference - I spent 20 minutes discussing the patient's prognosis and her likely outcome with the family. I recommended supportive care for the next 24 hours and if no improvement consideration for withdrawal of care as I suspect she is actively dying.  Cristopher Peru, M.D.   For questions or updates, please contact [place] Please consult [email or web address] for contact info under Cardiology/STEMI.   Signed, Cristopher Peru, MD  10/25/2016 9:23 AM

## 2016-10-25 NOTE — Progress Notes (Signed)
Called into pt's room by family member, pt agitated and stated "no air was coming from her mask". Pt SOB despite 100 O2 sats and bipap functioning. Repositioned pt, gave IM hydroxyzine with no improvement. Rapid called in to assess pt, during assessment pt had another pause, eyes rolled back into head and pt became briefly unresponsive. Code blue called. MD notified. Pt subsequently intubated, transferred to [facility]. Bedside report given.

## 2016-10-25 NOTE — Progress Notes (Signed)
Pt comfort care only.  Pt briefly assessed and appears comfortable on fentanyl gtt. Family at bedside, questions answered, and support given. Will continue to monitor pt.

## 2016-10-25 NOTE — Consult Note (Addendum)
..   Name: Haley Graham MRN: [id number] DOB: 03/23/1955    ADMISSION DATE:  09/05/2016 CONSULTATION DATE:  10/25/16 AT 2:16 AM  REFERRING MD :  MATTHEWS MD CHIEF COMPLAINT:  UNRESPONSIVE, AGONAL, BRADYCARDIC  BRIEF PATIENT DESCRIPTION: 61 YR OLD FEMALE WITH LONGA ND COMPLICATED HOSPITAL COURSE ACUTELY DECOMPENSATED IN ACUTE HYPOXIC RESP FAILURE, BRADYCARDIC AND NOT ALERT  SIGNIFICANT EVENTS  UNRESPONSIVE DESATURATING ON NRB BRADYCARDIC WITH PAUSES  STUDIES:  CXR ABG LACTATE   HISTORY OF PRESENT ILLNESS:  61 y.o. 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 Patient states has had significant generalized weakness over the past 3-7 days to the point which was unable to ambulate with some associated nausea and not feeling well, chills, decreased oral intake. Patient states since his surgery during the last was positioned she's been having loose stool/diarrhea as well as decreased oral intake. Patient denies any fevers, no emesis, no shortness of breath, no abdominal pain, no dysuria, no hematemesis, no melena, no hematochezia, no cough, no dysuria. Patient does endorse some midsternal chest pain across her chest which is nonradiating and intermittent in nature. Patient was seen at the cardiothoracic surgeon's office on the day of admission with his symptoms of generalized significant weakness. Chest x-ray obtained showed aortic stent grafting good position with no effusions or signs of heart failure. It was felt at that time that patient's generalized weakness/failure to thrive was likely secondary to poor oral intake  secondary to chronic mesenteric ischemia and vascular disease. It was recommended patient be transferred to [facility] for further evaluation. Patient subsequently ended up at [facility].  10/25/16: Called to bedside 2:16 AM- Rapid Response in progress acutely decompensated agonally breathing, bradycardic and having sinus pauses. Endotracheally intubated to protect airway and resp failure. ( Desaturating despite NRB. Spoke with husband and informed him of her clinical decline.   PAST MEDICAL HISTORY :   has a past medical history of Anxiety; Arthritis; Diabetes mellitus without complication (HCC); Gastroparesis; GERD (gastroesophageal reflux disease); Hypertension; and IBS (irritable bowel syndrome).  has a past surgical history that includes Abdominal hysterectomy; Colonoscopy; Cholecystectomy; Upper gastrointestinal endoscopy; Cesarean section; Colonoscopy (N/A, 06/10/2016); polypectomy (06/10/2016); Aortogram (N/A, 07/24/2016); Thoracic aortic endovascular stent graft (N/A, 08/06/2016); IR THORACENTESIS ASP PLEURAL SPACE W/IMG GUIDE (09/18/2016); and LEFT HEART CATH AND CORONARY ANGIOGRAPHY (N/A, 10/06/2016). Prior to Admission medications   Medication Sig Start Date End Date Taking? Authorizing Provider  ALPRAZolam (XANAX) 0.5 MG tablet Take 0.5-1 tablets (0.25-0.5 mg total) by mouth 2 (two) times daily as needed for anxiety. 08/22/16  Yes Gold, Wayne E, PA-C  alum & mag hydroxide-simeth (MAALOX/MYLANTA) 200-200-20 MG/5ML suspension Take 30 mLs by mouth 2 (two) times daily as needed for indigestion or heartburn.   Yes [provider]  aspirin EC 81 MG EC tablet Take 1 tablet (81 mg total) by mouth daily. 08/22/16  Yes Gold, Wayne E, PA-C  citalopram (CELEXA) 40 MG tablet Take 40 mg by mouth at bedtime.    Yes [provider]  clonazePAM (KLONOPIN) 1 MG tablet Take 1 tablet (1 mg total) by mouth at bedtime. Patient taking differently: Take 1 mg by mouth at bedtime as needed (for sleep).   08/22/16  Yes Gold,   Wayne E, PA-C  fluticasone (FLONASE) 50 MCG/ACT nasal spray Place 2 sprays into both nostrils daily as needed for allergies or rhinitis.  05/17/16  Yes [provider]  glipiZIDE-metformin (METAGLIP) 5-500 MG tablet Take 0.5 tablets by mouth 2 (two) times daily before a meal. 08/22/16  Yes Gold, Wayne E, PA-C  metoprolol tartrate (LOPRESSOR) 25 MG tablet Take 25 mg by mouth 2 (two) times daily.   Yes [provider]  mometasone-formoterol (DULERA) 200-5 MCG/ACT AERO Inhale 2 puffs into the lungs 2 (two) times daily. 08/22/16  Yes Gold, Wayne E, PA-C  ondansetron (ZOFRAN) 4 MG tablet Take 4 mg by mouth every 8 (eight) hours as needed for nausea or vomiting.   Yes [provider]  pantoprazole (PROTONIX) 40 MG tablet Take 40 mg by mouth daily as needed (acid reflux/ indigestion).  11/12/11  Yes Rehman, Mechele Dawley, MD  Probiotic Product (PROBIOTIC PO) Take 1 tablet by mouth at bedtime. Gut Restore Ultimate (probiotic with zinc and vitamin C)   Yes [provider]  traMADol (ULTRAM) 50 MG tablet Take 1 tablet (50 mg total) by mouth every 6 (six) hours as needed. Patient taking differently: Take 25 mg by mouth every 6 (six) hours as needed for moderate pain.  09/01/16  Yes Ivin Poot, MD  Metoprolol Tartrate 37.5 MG TABS Take 37.5 mg by mouth 2 (two) times daily. Patient not taking: Reported on 09/04/2016 08/22/16   Jadene Pierini E, PA-C  oxyCODONE 10 MG TABS Take 1 tablet (10 mg total) by mouth 2 (two) times daily as needed for severe pain. Patient not taking: Reported on 09/26/2016 08/22/16   John Giovanni, PA-C   Allergies  Allergen Reactions  . Penicillins Rash and Other (See Comments)    PATIENT HAS HAD A PCN REACTION WITH IMMEDIATE RASH, FACIAL/TONGUE/THROAT SWELLING, SOB, OR LIGHTHEADEDNESS WITH HYPOTENSION:  #  #  #  YES  #  #  #   Has patient had a PCN reaction causing severe rash involving mucus membranes or skin necrosis: no Has patient had a  PCN reaction that required hospitalization no Has patient had a PCN reaction occurring within the last 10 years:no If all of the above answers are "NO", then may proceed with Cephalosporin use.  . Ativan [Lorazepam] Other (See Comments)    Makes pt very confused and more agitated, irritable.  . Codeine Nausea And Vomiting  . Reglan [Metoclopramide] Other (See Comments)    TACHYCARDIA    FAMILY HISTORY:  family history includes Cancer in her mother; Heart attack in her mother; Stroke in her father and mother. SOCIAL HISTORY:  reports that she has been smoking Cigarettes.  She has a 17.25 pack-year smoking history. She has never used smokeless tobacco. She reports that she does not drink alcohol or use drugs.  REVIEW OF SYSTEMS:   Constitutional: Negative for fever, chills, weight loss, malaise/fatigue and diaphoresis.  HENT: Negative for hearing loss, ear pain, nosebleeds, congestion, sore throat, neck pain, tinnitus and ear discharge.   Eyes: Negative for blurred vision, double vision, photophobia, pain, discharge and redness.  Respiratory: Negative for cough, hemoptysis, sputum production, shortness of breath, wheezing and stridor.   Cardiovascular: Negative for chest pain, palpitations, orthopnea, claudication, leg swelling and PND.  Gastrointestinal: Negative for heartburn, nausea, vomiting, abdominal pain, diarrhea, constipation, blood in stool and melena.  Genitourinary: Negative for dysuria, urgency, frequency, hematuria and flank pain.  Musculoskeletal: Negative for myalgias, back pain, joint pain and falls.  Skin: Negative  for itching and rash.  Neurological: Negative for dizziness, tingling, tremors, sensory change, speech change, focal weakness, seizures, loss of consciousness, weakness and headaches.  Endo/Heme/Allergies: Negative for environmental allergies and polydipsia. Does not bruise/bleed easily.  SUBJECTIVE:   VITAL SIGNS: Temp:  [97.6 F (36.4 C)-98.5 F (36.9 C)]  98.5 F (36.9 C) (09/22 1200) Pulse Rate:  [112-118] 115 (09/22 2310) Resp:  [21-32] 21 (09/23 0045) BP: (140-172)/(85-122) 154/86 (09/23 0045) SpO2:  [97 %-100 %] 100 % (09/23 0257) FiO2 (%):  [40 %-100 %] 100 % (09/23 0257) Weight:  [76.9 kg (169 lb 9.6 oz)] 76.9 kg (169 lb 9.6 oz) (09/22 0500)  PHYSICAL EXAMINATION: General:  In acute distress Neuro: GCS 3, obtunded HEENT:  noromcephalic dry oral mucosa, mouth open  Cardiovascular:  Bradycardic S1 and S2 Lungs:  Coarse breath sounds bilaterally Abdomen: distended abdomen hypactive BS Musculoskeletal:  No deformity noted Skin: no rashes   Recent Labs Lab 10/22/16 0427 10/23/16 0454 10/24/16 0353  NA 133* 133* 132*  K 4.0 3.4* 4.5  CL 102 103 104  CO2 _0 BUN _1 CREATININE 0.60 0.53 0.50  GLUCOSE 159* 150* 189*    Recent Labs Lab 10/23/16 0454 10/24/16 0353 10/24/16 2143  HGB 7.1* 7.2* 7.3*  HCT 22.5* 22.7* 23.7*  WBC 8.2 12.5* 17.4*  PLT 99* 81* 100*   Dg Chest 1 View  Result Date: 10/24/2016 CLINICAL DATA:  Hypoxia EXAM: CHEST 1 VIEW COMPARISON:  10/17/2016 FINDINGS: Right-sided PICC line is again identified and stable. A nasogastric catheter is now seen in the stomach. Thoracic stent graft is again noted and stable. Cardiac shadow is stable. Bibasilar atelectasis and small effusions are seen. No new focal abnormality is noted. IMPRESSION: Interval placement of nasogastric catheter. The remainder of the exam is stable. Electronically Signed   By: Inez Catalina M.D.   On: 10/24/2016 09:17   Dg Abd 1 View  Result Date: 10/24/2016 CLINICAL DATA:  Abdominal pain. EXAM: ABDOMEN - 1 VIEW COMPARISON:  10/23/2016 FINDINGS: Nasogastric tube extends into the abdomen. The catheter tip is likely in the duodenum. Gas in the stomach and throughout the bowel loops in the abdomen and pelvis. Stent graft again noted in the lower thoracic aorta. Evidence for cholecystectomy clips. Stable consolidation or densities at  left lung base. Limited evaluation for free air on this supine view. IMPRESSION: Stable position of nasogastric tube, likely in the duodenum. No significant change in the bowel gas pattern. Scattered gas-filled loops of bowel throughout the abdomen and upper pelvis. Persistent densities at left lung base. Electronically Signed   By: Markus Daft M.D.   On: 10/24/2016 09:23   Dg Abd Portable 1v  Result Date: 10/23/2016 CLINICAL DATA:  NG tube placement EXAM: PORTABLE ABDOMEN - 1 VIEW COMPARISON:  10/23/2016 FINDINGS: Partially visualized aortic stent. Surgical clips in the right upper quadrant. Esophageal tube tip overlies the gastroduodenal junction. Slight decreased gaseous enlargement of bowel. IMPRESSION: 1. Esophageal tube tip overlies the gastroduodenal junction 2. Slight decreased gaseous dilatation of bowel 3. Atelectasis or infiltrate at the left lung base Electronically Signed   By: Donavan Foil M.D.   On: 10/23/2016 19:58   Dg Abd Portable 1v  Result Date: 10/23/2016 CLINICAL DATA:  Abdominal pain. EXAM: PORTABLE ABDOMEN - 1 VIEW COMPARISON:  10/22/2016. FINDINGS: NG tube in stable position. Prior cholecystectomy . Persistent dilated loops of bowel some of which may represent small bowel again noted without interim change. Persistent bowel wall  thickening consistent with bowel edema again noted. No free air. IMPRESSION: 1. NG tube in stable position. 2. Persistent dilated loops of bowel some of which may represent small bowel again noted without interim change. Persistent bowel wall thickening consistent with bowel wall edema again noted. An active process such as enteritis could present this fashion. Reference is made to CT report of 10/22/2016. Electronically Signed   By: Thomas  Register   On: 10/23/2016 08:01    ASSESSMENT / PLAN:  NEURO: GCS 3 prior to any sedation Agonally breathing  Now intubated Given versed and rocuronium for intubation Neuro-checks ABG to r/o CO2 narcosis and  hypoxia Prn sedation protocol if pt wakes up  Not requiring at this time If GCS does not improve w/o sedation may need CTH   CARDIAC: HFrEF EF 35-40% 9/12 ECHO- akinesis of the mid-apicalanteroseptal, anterior,   anterolateral, lateral, and apical myocardium H/o Stable abdominal aortic dissection with chronic thrombosis of true lumen. Bradycardic with Sinus Pauses Returned to NSR without intervention Cardiology aware etiology Vagal mediated Atropine at bedside MAP goal>65mmHg Not on any vasopressors Hold beta blockers at this time Pt received IV metorprolol prior to this event Last EF on ECHO H/o QT proilongation   PULMONARY: Intubated on PRVC Mechanical ventilation ETT size 7 24 at lip  ABG pending Will adjust ventilator based on findings CXR to follow up ETT placement   ID: C. Albicans + on blood cx from 09/16/16  Most recent cx on 9/11 No growth to date Urine + >100,000 back on 8/15 Pleural fluid on 8/17 was negative   Endocrine: H/o DM On ICU Glycemic protocol Concern for hypoglycemia w/o TPN Started on D51/2 NS in interim  Until TPN can be restarted  GI: OGT in place Bilious secretions from low intermittent suction Abdomen distended KUB for OGT placement Pt has a prolonged QT Lansoprazole 15 via OGT for GI PPx Previously on TPN - which was discarded To prevent hypoglycemia started pt on D5 1/2 NS at 50 Alk phos trending down AST/ALT not elevated - s/p cholecystectomy NAFLD on US Abdomen CTAB/PELVIS 10/22/16 - reviewed Distal small bowel obstruction, without significant change. Mild increase in ascites. Stable mild wall thickening involving distal small bowel loops  Mild decrease in small splenic infarcts. Stable left renal parenchymal atrophy and old infarcts.   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  Superficial vein thrombosis is noted in the right basilic vein surrounding the PICC.  Superficial vein thrombosis within the left basilic vein. DVT PPx->heparin and SCDs on board  RENAL Creatinine stable at 0.50 Insert foley catheter Strict Is and OS  Replace electrolytes as needed  Sent Lactate. .. Lab Results  Component Value Date   CREATININE 0.50 10/24/2016   CREATININE 0.53 10/23/2016   CREATININE 0.60 10/22/2016    DISPOSITION: ICU CODE STATUS:Full FAMILY: husband ( spoke to him over the phone) Son in law at bedside, Daughter not present CC TIME: 55 MINS   I, Dr   have personally reviewed patient's available data, including medical history, events of note, physical examination and test results as part of my evaluation. I have discussed with other providers such as pharmacist, RN and RRT.  The patient is critically ill with multiple organ systems failure and requires high complexity decision making for assessment   and support, frequent evaluation and titration of therapies, application of advanced monitoring technologies and extensive interpretation of multiple databases. She has been in the hospital since 09/08/2016 Her prognosis is poor.Family at bedside I had a frank discussion with husband and daughter regarding goals of care. They expressed understanding of her worsening clinical status and that her prognosis is guarded. In the event that her heart should stop she is now a DNR. Nurse witnessed conversation. We will continue current care. Pt started to open eyes slightly restless so placed on sedation protocol with a RASS 0 to -1. Critical Care Time devoted to patient care services described in this note is 37  Minutes. This time reflects time of care of this signee Dr Seward Carol. This critical care time does not reflect procedure time, or teaching time or supervisory time but could involve care  discussion time    Signed Dr Seward Carol Pulmonary Critical Care Locums Pulmonary and New Site Pager: [phone]  10/25/2016, 3:04 AM

## 2016-10-25 NOTE — Progress Notes (Signed)
..   Name: Haley Graham MRN: [id number] DOB: [birth year]    ADMISSION DATE:  09/24/2016 CONSULTATION DATE:  10/25/16 AT 2:16 AM  REFERRING MD :  MATTHEWS MD CHIEF COMPLAINT:  UNRESPONSIVE, AGONAL, BRADYCARDIC  BRIEF PATIENT DESCRIPTION: 61 YR OLD FEMALE WITH LONGA ND [place] COURSE ACUTELY DECOMPENSATED IN ACUTE HYPOXIC RESP FAILURE, BRADYCARDIC AND NOT ALERT  SIGNIFICANT EVENTS  9/23 UNRESPONSIVE DESATURATING ON NRB BRADYCARDIC WITH PAUSES 9/23 family leaning towards comfort care.  STUDIES:     HISTORY OF PRESENT ILLNESS:  61 y.o. 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 Patient states has had significant generalized weakness over the past 3-7 days to the point which was unable to ambulate with some associated nausea and not feeling well, chills, decreased oral intake. Patient states since his surgery during the last was positioned she's been having loose stool/diarrhea as well as decreased oral intake. Patient denies any fevers, no emesis, no shortness of breath, no abdominal pain, no dysuria, no hematemesis, no melena, no hematochezia, no cough, no dysuria. Patient does endorse some midsternal chest pain across her chest which is nonradiating and intermittent in nature. Patient was seen at the cardiothoracic surgeon's office on the day of admission with his symptoms of generalized significant weakness. Chest x-ray obtained showed aortic stent grafting good position with no effusions or signs of heart failure. It was felt at that time that patient's generalized weakness/failure to thrive was  likely secondary to poor oral intake secondary to chronic mesenteric ischemia and vascular disease. It was recommended patient be transferred to [facility] ED for further evaluation. Patient subsequently ended up at [facility] long ED.  10/25/16: Called to bedside 2:16 AM- Rapid Response in progress acutely decompensated agonally breathing, bradycardic and having sinus pauses. Endotracheally intubated to protect airway and resp failure. ( Desaturating despite NRB. Spoke with husband and informed him of her clinical decline.    SUBJECTIVE:  Elderly female who has declined over several months follow aortic repair which culminated in resp arrest 9/22. Family is leaning towards comfort care as they feel she has suffered enough. Little to offer that will return a higher quality of life to her.   VITAL SIGNS: Temp:  [96 F (35.6 C)-98.5 F (36.9 C)] 96 F (35.6 C) (09/23 0734) Pulse Rate:  [91-118] 98 (09/23 0700) Resp:  [14-32] 19 (09/23 0700) BP: (153-172)/(69-110) 165/78 (09/23 0700) SpO2:  [95 %-100 %] 100 % (09/23 0700) FiO2 (%):  [40 %-100 %] 100 % (09/23 0430)  PHYSICAL EXAMINATION: General:  Frail, ill appearing female HEENT:ET->vent LGX:QJJHERD Neuro: no follow commands, grimaces CV: hsr rrr PULM: agonal breathing(RR increased) decreased bs bases EY:CXKG, non-tender, faint bs Extremities: warm/dry, ++ edema  Skin: multiple areas of breakdown with dressings    Recent Labs Lab 10/23/16 0454 10/24/16 0353 10/25/16 0431  NA 133* 132* 134*  K 3.4* 4.5 4.7  CL 103 104 106  CO2 _0 BUN _1 CREATININE 0.53 0.50 0.49  GLUCOSE 150* 189* 157*    Recent Labs Lab 10/24/16 0353 10/24/16 2143 10/25/16 0431  HGB 7.2* 7.3* 7.3*  HCT 22.7* 23.7* 23.3*  WBC 12.5* 17.4* 18.6*  PLT 81* 100* 72*   Dg Chest  1 View  Result Date: 10/24/2016 CLINICAL DATA:  Hypoxia EXAM: CHEST 1 VIEW COMPARISON:  10/17/2016 FINDINGS: Right-sided PICC line is again identified and stable. A  nasogastric catheter is now seen in the stomach. Thoracic stent graft is again noted and stable. Cardiac shadow is stable. Bibasilar atelectasis and small effusions are seen. No new focal abnormality is noted. IMPRESSION: Interval placement of nasogastric catheter. The remainder of the exam is stable. Electronically Signed   By: Inez Catalina M.D.   On: 10/24/2016 09:17   Dg Abd 1 View  Result Date: 10/24/2016 CLINICAL DATA:  Abdominal pain. EXAM: ABDOMEN - 1 VIEW COMPARISON:  10/23/2016 FINDINGS: Nasogastric tube extends into the abdomen. The catheter tip is likely in the duodenum. Gas in the stomach and throughout the bowel loops in the abdomen and pelvis. Stent graft again noted in the lower thoracic aorta. Evidence for cholecystectomy clips. Stable consolidation or densities at left lung base. Limited evaluation for free air on this supine view. IMPRESSION: Stable position of nasogastric tube, likely in the duodenum. No significant change in the bowel gas pattern. Scattered gas-filled loops of bowel throughout the abdomen and upper pelvis. Persistent densities at left lung base. Electronically Signed   By: Markus Daft M.D.   On: 10/24/2016 09:23   Dg Chest Port 1 View  Result Date: 10/25/2016 CLINICAL DATA:  Endotracheal tube repositioning EXAM: PORTABLE CHEST 1 VIEW COMPARISON:  Chest radiograph 10/25/2016 FINDINGS: The endotracheal tube tip has been retracted and is 2.6 cm above the inferior margin of the carina. It could be retracted approximately the 3 more cm for optimal positioning at the level of the clavicular heads. Right PICC line tip at cavoatrial junction. Orogastric tube courses below the diaphragm with the tip oriented cranially at the level of the gastroesophageal junction. There are bilateral pleural effusions with unchanged bibasilar consolidation. IMPRESSION: 1. Retraction of endotracheal tube by approximately 1 cm. This could be retracted further 2-2.5 cm for optimal positioning. 2.  Unchanged pleural effusions and bibasilar consolidation. Electronically Signed   By: Ulyses Jarred M.D.   On: 10/25/2016 03:25   Dg Chest Port 1 View  Result Date: 10/25/2016 CLINICAL DATA:  Intubated EXAM: PORTABLE CHEST 1 VIEW COMPARISON:  10/24/2016, 10/17/2016, CT chest 09/17/2016, 09/27/2016 FINDINGS: Interval intubation, tip of the endotracheal tube is about 9 mm superior to carina. Esophageal tube tip is below the diaphragm. Right upper extremity catheter tip overlies the distal SVC. Aortic stent graft re- demonstrated. Bilateral pleural effusions, slight increase on the right side. Stable cardiomediastinal silhouette. Continued bibasilar atelectasis or pneumonia. Mild diffuse background interstitial edema. No pneumothorax. IMPRESSION: 1. Interval intubation, tip of the endotracheal tube is about 9 mm superior to carina. 2. Slight interval increase in right pleural effusion. Left pleural effusion and bibasilar atelectasis or infiltrates are otherwise unchanged. Stable cardiomediastinal silhouette with diffuse interstitial opacities suspicious for pulmonary edema Electronically Signed   By: Donavan Foil M.D.   On: 10/25/2016 03:20   Dg Abd Portable 1v  Result Date: 10/25/2016 CLINICAL DATA:  Orogastric tube placement EXAM: PORTABLE ABDOMEN - 1 VIEW COMPARISON:  Abdominal radiograph 10/24/2016 FINDINGS: The side port of the orogastric tube is in the stomach. The tip is oriented cranially and projects near the gastroesophageal junction. There is a right pleural effusion. IMPRESSION: Orogastric tube tip oriented cranially near the gastroesophageal junction. The side port is in the stomach. Electronically Signed   By: Ulyses Jarred M.D.   On: 10/25/2016 03:23   Dg Abd Portable  1v  Result Date: 10/23/2016 CLINICAL DATA:  NG tube placement EXAM: PORTABLE ABDOMEN - 1 VIEW COMPARISON:  10/23/2016 FINDINGS: Partially visualized aortic stent. Surgical clips in the right upper quadrant. Esophageal tube tip  overlies the gastroduodenal junction. Slight decreased gaseous enlargement of bowel. IMPRESSION: 1. Esophageal tube tip overlies the gastroduodenal junction 2. Slight decreased gaseous dilatation of bowel 3. Atelectasis or infiltrate at the left lung base Electronically Signed   By: Donavan Foil M.D.   On: 10/23/2016 19:58    ASSESSMENT / PLAN:  NEURO: GCS 3 prior to any sedation Agonally breathing  Now intubated Given versed and rocuronium for intubation Neuro-checks ABG to r/o CO2 narcosis and hypoxia Fentanyl drip for grimaces Rass -1 on sedation  may need CTH Family is considering comfort care   CARDIAC: HFrEF EF 35-40% 9/12 ECHO- akinesis of the mid-apicalanteroseptal, anterior,   anterolateral, lateral, and apical myocardium H/o Stable abdominal aortic dissection with chronic thrombosis of true lumen. Bradycardic with Sinus Pauses Returned to NSR without intervention Cardiology aware etiology Vagal mediated Atropine at bedside MAP goal>79mHg Not on any vasopressors Hold beta blockers at this time Pt received IV metorprolol prior to this event Last EF on ECHO 35% with hypokinesis  H/o QT proilongation   PULMONARY: Intubated on PRVC Mechanical ventilation ETT size 7 24 at lip  Will adjust ventilator based on findings. Increased rr to 26 for air hunger and ph 7.21. Repeat abg pending CXR to follow up ETT placement   ID: C. Albicans + on blood cx from 09/16/16  Most recent cx on 9/11 No growth to date Urine + >100,000 back on 8/15 Pleural fluid on 8/17 was negative   Endocrine: CBG (last 3)   Recent Labs  10/24/16 0417 10/24/16 1154 10/25/16 0342  GLUCAP 198* 207* 154*     H/o DM On ICU Glycemic protocol Concern for hypoglycemia w/o TPN Started on D51/2 NS in interim  Until TPN can be restarted  GI: OGT in place Bilious secretions from low intermittent suction Abdomen distended Lansoprazole 15 via OGT for GI PPx Previously on TPN - which was  discarded To prevent hypoglycemia started pt on D5 1/2 NS at 50 Alk phos trending down AST/ALT not elevated - s/p cholecystectomy NAFLD on UKoreaAbdomen CTAB/PELVIS 10/22/16 - reviewed Distal small bowel obstruction, without significant change.pet x ray, now on fentanyl drip.    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  Superficial vein thrombosis is noted in the right basilic vein surrounding the PICC.  Superficial vein thrombosis within the left basilic vein. DVT PPx->heparin and SCDs on board  RENAL Creatinine stable at 0.50  foley catheter Strict Is and OS  Replace electrolytes as needed   Lactate. 1.0 .. Lab Results  Component Value Date   CREATININE 0.49 10/25/2016   CREATININE 0.50 10/24/2016   CREATININE 0.53 10/23/2016    DISPOSITION: ICU CODE STATUS:Full FAMILY:Long discussion with family. Granddaughter is RT. They are concerned with quality of life. Mrs. Benedick has been declining since over several months and has lost desire to continue the struggle to get better. The family is leaning towards comfort care. Will discuss with Dr. FTitus Mould CC TIME: 4BerinoMinor ACNP LMarin Comment  [place] Pager [phone] till 3 pm If no answer page [phone] 10/25/2016, 7:41 AM   STAFF NOTE: I, Merrie Roof, MD FACP have personally reviewed patient's available data, including medical history, events of note, physical examination and test results as part of my evaluation. I have discussed with resident/NP and other care providers such as pharmacist, RN and RRT. In addition, I personally evaluated patient and elicited key findings of: not awake, will open eyes , pain apparent, jvd, lungs sounds ronchi throughout,  abdo soft, BS wnl, ngt has output, edema diffuse 4 plus, echymosis on ext, pcxr I reviewed in addition to all her prior CT shows effusions moderate and now some increased alveolar infiltrate bases, ett wnl, CT also with effusions noted prior, she presents with failure to thrive in a setting of Aortic dissection and concerns abdo ischemia prior now with 3 days resp failure related to effusions, edema and asp PNA likely, ABg reviewed increased MV, repeat abg adequate, MV too high for weaning, would favor neg balance with lasix, given int brady would use drip, assess lytes in pm and in am, nosocomial exposure- change ctx to ceftaz, add vanc, ensure sputum sent, for tpn ?, keep NPO, will d/w vascular surgery, repeat pcxr in am, I will discuss with family now failure to thrive and options, given her prior status and hospital stay and now this need mechanical support, likely comfort approach is best, unlikley to have quality of life improvement  The patient is critically ill with multiple organ systems failure and requires high complexity decision making for assessment and support, frequent evaluation and titration of therapies, application of advanced monitoring technologies and extensive interpretation of multiple databases.   Critical Care Time devoted to patient care services described in this note is 40 Minutes. This time reflects time of care of this signee: Merrie Roof, MD FACP. This critical care time does not reflect procedure time, or teaching time or supervisory time of PA/NP/Med student/Med Resident etc but could involve care discussion time. Rest per NP/medical resident whose note is outlined above and that I agree with   Lavon Paganini. Titus Mould, MD, [place] Pgr: [facility] 10/25/2016 10:11 AM

## 2016-10-25 NOTE — Progress Notes (Signed)
Pharmacy Antibiotic Note  Haley Graham is a 61 y.o. female admitted on 09/18/2016 with aortic dissection and has had complicated course. Current ABX regimen andiulafungin 100mg  q24h, metronidazole 500mg  q8h and ceftriaxone 2gm q24h. WBC rising to 18, afebrile,  acute respiratory distress this am now intubated, Cr stable thus far 0.5.   Pharmacy has been consulted for vancomycin and ceftazidime dosing.  (Noted PCN allergy but has tolerated ceftriaxone and cefepime)  Plan: Stop ceftriaxone Ceftazidime 1gm IV q8h Vancomycin 750mg  IV q12h  Height: 5\' 3"  (160 cm) Weight: 169 lb 9.6 oz (76.9 kg) IBW/kg (Calculated) : 52.4  Temp (24hrs), Avg:97.3 F (36.3 C), Min:96 F (35.6 C), Max:98.5 F (36.9 C)   Recent Labs Lab 10/21/16 0446 10/22/16 0427 10/22/16 1501 10/23/16 0454 10/24/16 0353 10/24/16 2143 10/25/16 0430 10/25/16 0431 10/25/16 0717  WBC 9.8  --  10.0 8.2 12.5* 17.4*  --  18.6*  --   CREATININE 0.67 0.60  --  0.53 0.50  --   --  0.49  --   LATICACIDVEN  --   --   --   --   --   --  1.0  --  0.8    Estimated Creatinine Clearance: 73.4 mL/min (by C-G formula based on SCr of 0.49 mg/dL).    Allergies  Allergen Reactions  . Penicillins Rash and Other (See Comments)    Has tolerated Ceftriaxone and Cefepime   PATIENT HAS HAD A PCN REACTION WITH IMMEDIATE RASH, FACIAL/TONGUE/THROAT SWELLING, SOB, OR LIGHTHEADEDNESS WITH HYPOTENSION:  #  #  #  YES  #  #  #   Has patient had a PCN reaction causing severe rash involving mucus membranes or skin necrosis: no Has patient had a PCN reaction that required hospitalization no Has patient had a PCN reaction occurring within the last 10 years:no If all of the above answers are "NO", then may proceed with Cephalosporin use.  . Ativan [Lorazepam] Other (See Comments)    Makes pt very confused and more agitated, irritable.  . Codeine Nausea And Vomiting  . Reglan [Metoclopramide] Other (See Comments)    TACHYCARDIA   Vancomycin 8/8  >>8/11 restart 9/23 > Levaquin 8/8 >> 8/10 Flagyl 8/8>> 8/21  Cefepime 8/10 >> (8/21)  Eraxis 8/18>> 8/19 Flucon 8/19>>9/11 ----- Eraxis 9/11 >> (9/26) Ceftriaxone 9/11>> 9/23 Ceftazidime 9/23> Flagyl 9/11>>  8/8 blood cx: negative 8/8 urine cx: negative, final 8/8 GI panel: neg 8/8 Cdiff: neg/neg 8/15 urine - >100K/ml yeast 8/15 blood x 2 - candida alb 1/2 8/17 pleural fluid- negative 9/5 C diff: neg 9/11 blood x2: negative    Bonnita Nasuti Pharm.D. CPP, BCPS Clinical Pharmacist [phone] 10/25/2016 11:40 AM

## 2016-11-02 NOTE — Progress Notes (Signed)
This RN observed pt's HR beginning to decrease. RN to bedside, no family present. Heather RN to check waiting room for family. This RN stayed with patient. Pt went into asystole.   San Jetty RN and Jake Bathe RN listened for one minute for heart tone. None auscultated. Telemetry strip printed. TOD [id number].  Family updated and now at bedside.

## 2016-11-02 DEATH — deceased

## 2016-11-16 ENCOUNTER — Encounter: Payer: BLUE CROSS/BLUE SHIELD | Admitting: Surgery

## 2016-12-03 NOTE — Discharge Summary (Signed)
Haley Graham COURSE ACUTELY DECOMPENSATED IN ACUTE HYPOXIC RESP FAILURE, BRADYCARDIC AND NOT ALERT  SIGNIFICANT EVENTS  9/23 UNRESPONSIVE DESATURATING ON NRB BRADYCARDIC WITH PAUSES 9/23 family leaning towards comfort care.  Comfort provided  Final diagnosis  1. Ischemic bowel 2. Abdominal ischemia, mesenteric 3. Acute resp failure 4. Aspiration pna 5. Anoxic brain injury 6. failure to thrive 7. Comfort care, dnr  Lavon Paganini. Titus Mould, MD, [place] Pgr: [facility]

## 2018-03-05 IMAGING — CR DG ABD PORTABLE 1V
1 series · 1 of 1 positions shown · non-contrast
Comparison: 07/02/2016

CLINICAL DATA: Aortic dissection.

EXAM:
PORTABLE ABDOMEN - 1 VIEW

[AP]
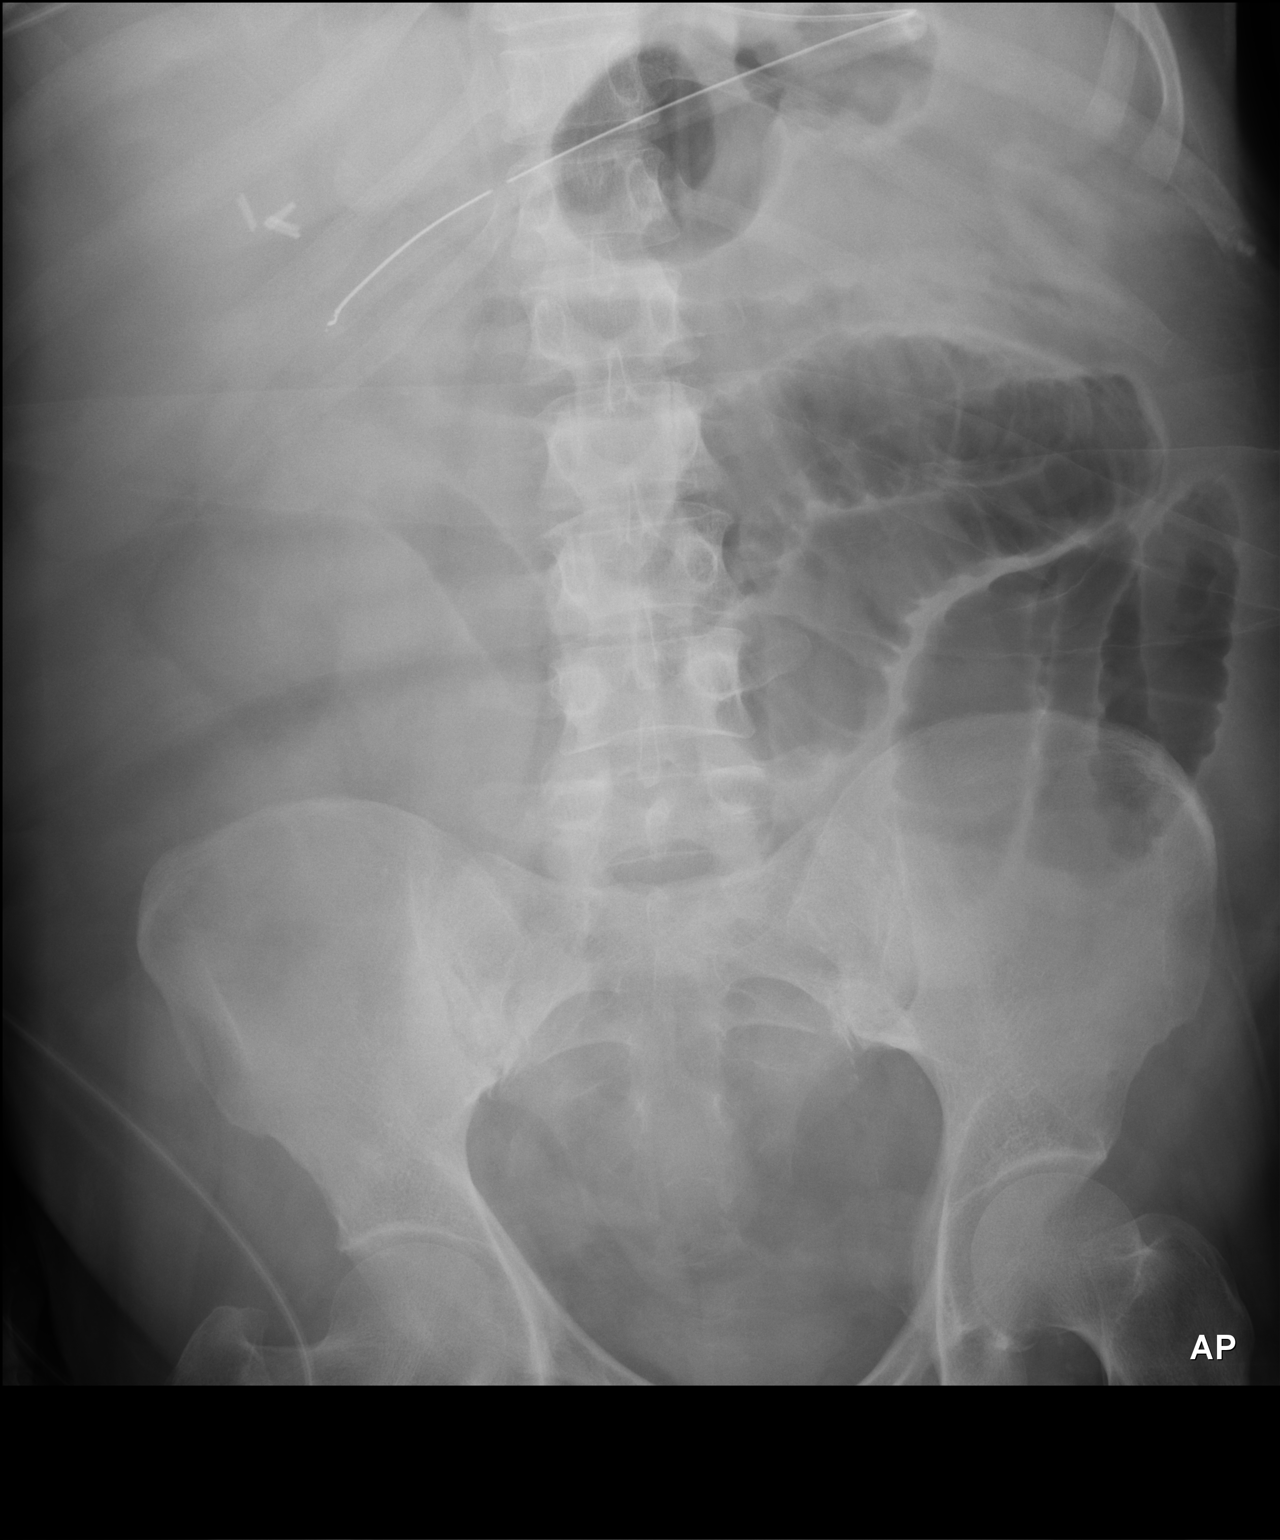

[1 of 1 positions shown; findings below may reference images not displayed]

FINDINGS: Nasogastric tube tip in the region of the antrum/pylorus. Persistent
group of dilated small bowel loops in the left mid abdomen with
possible bowel edema. Possibility of bowel ischemia certainly does
exist. Similar appearance to the study of 07/02/2016.
IMPRESSION: Nasogastric tube in place. Otherwise similar appearance with
abnormal small bowel pattern in the left central abdomen which could
go along with partial small bowel obstruction or a focal enteritis
including ischemic.

## 2018-05-21 IMAGING — DX DG CHEST 1V PORT
1 series · 1 of 1 positions shown · non-contrast
Comparison: 09/18/2016

CLINICAL DATA: Followup pleural effusion.

EXAM:
PORTABLE CHEST 1 VIEW

[chest]
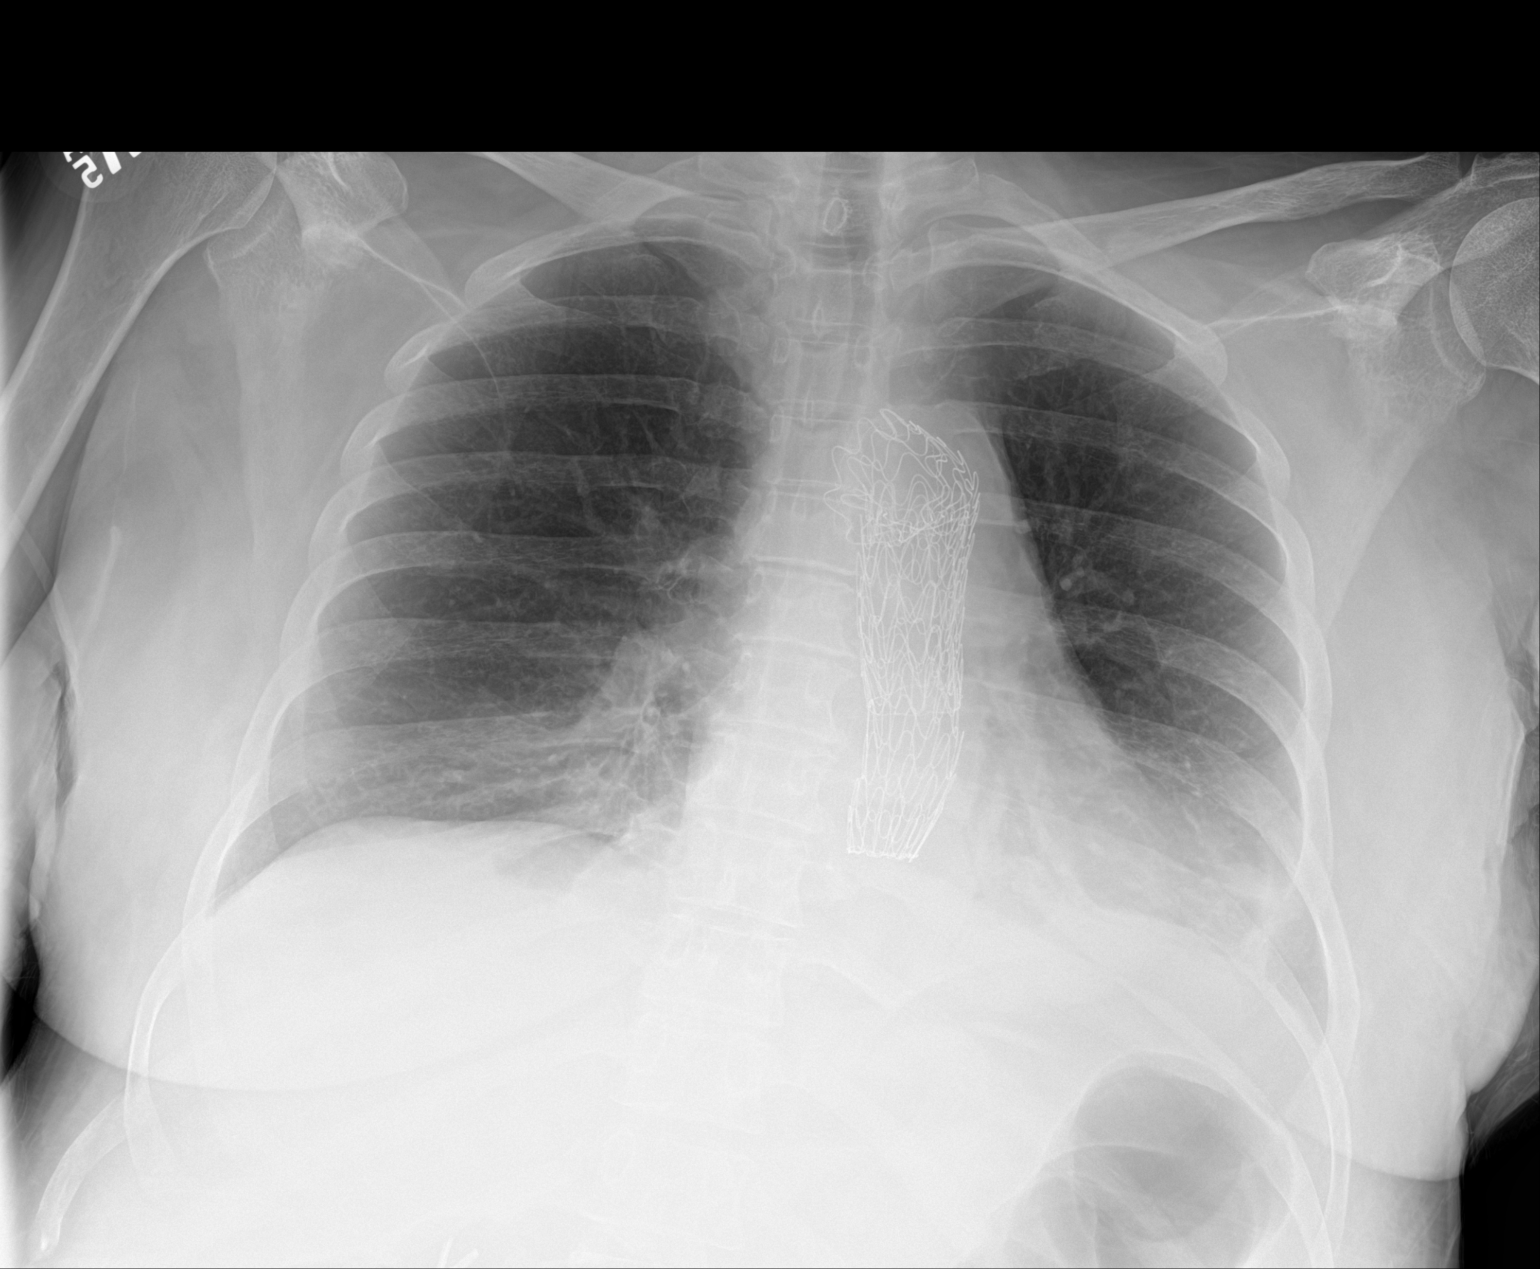

[1 of 1 positions shown; findings below may reference images not displayed]

FINDINGS: Mild left lung base opacity is consistent with a combination of
atelectasis and a small effusion, unchanged from the previous day's
study. Remainder of the lungs is clear.

Cardiac silhouette is normal in size. Stable thoracic aortic stent.
No mediastinal or hilar masses.

No pneumothorax.
IMPRESSION: 1. No change from previous day's study.
2. Persistent left lung base opacity consistent with combination
atelectasis and a probable small effusion.

## 2018-05-27 IMAGING — DX DG ABD PORTABLE 1V
1 series · 1 of 1 positions shown · non-contrast
Comparison: CT scan of September 17, 2016.

CLINICAL DATA: Feeding tube placement.

EXAM:
PORTABLE ABDOMEN - 1 VIEW

[abdomen]
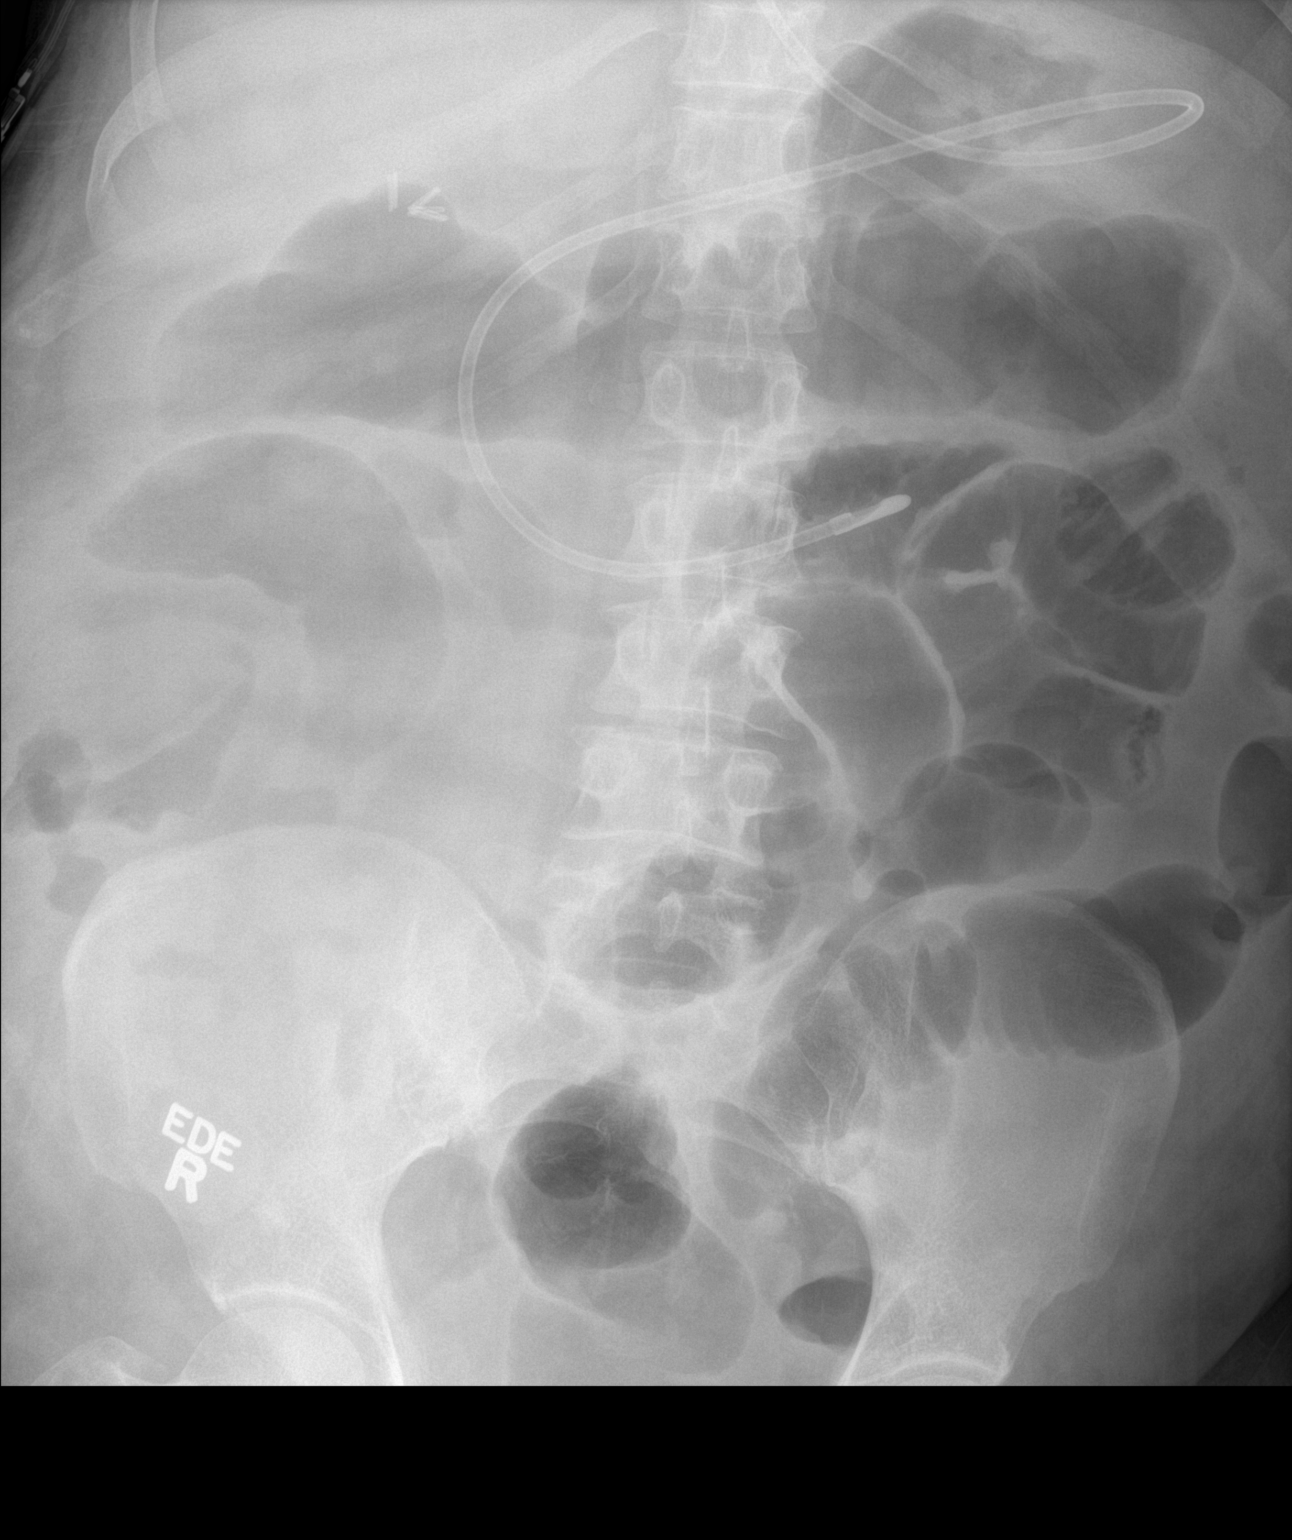

[1 of 1 positions shown; findings below may reference images not displayed]

FINDINGS: Distal tip of feeding tube is seen in expected position of fourth
portion of duodenum. Status post cholecystectomy. No abnormal bowel
dilatation is noted.
IMPRESSION: Distal tip of feeding tube is seen in expected position of fourth
portion of duodenum.

## 2018-05-28 IMAGING — DX DG CHEST 1V PORT
1 series · 1 of 1 positions shown · non-contrast
Comparison: 09/19/2016

CLINICAL DATA: Lung crackles

EXAM:
PORTABLE CHEST 1 VIEW

[chest]
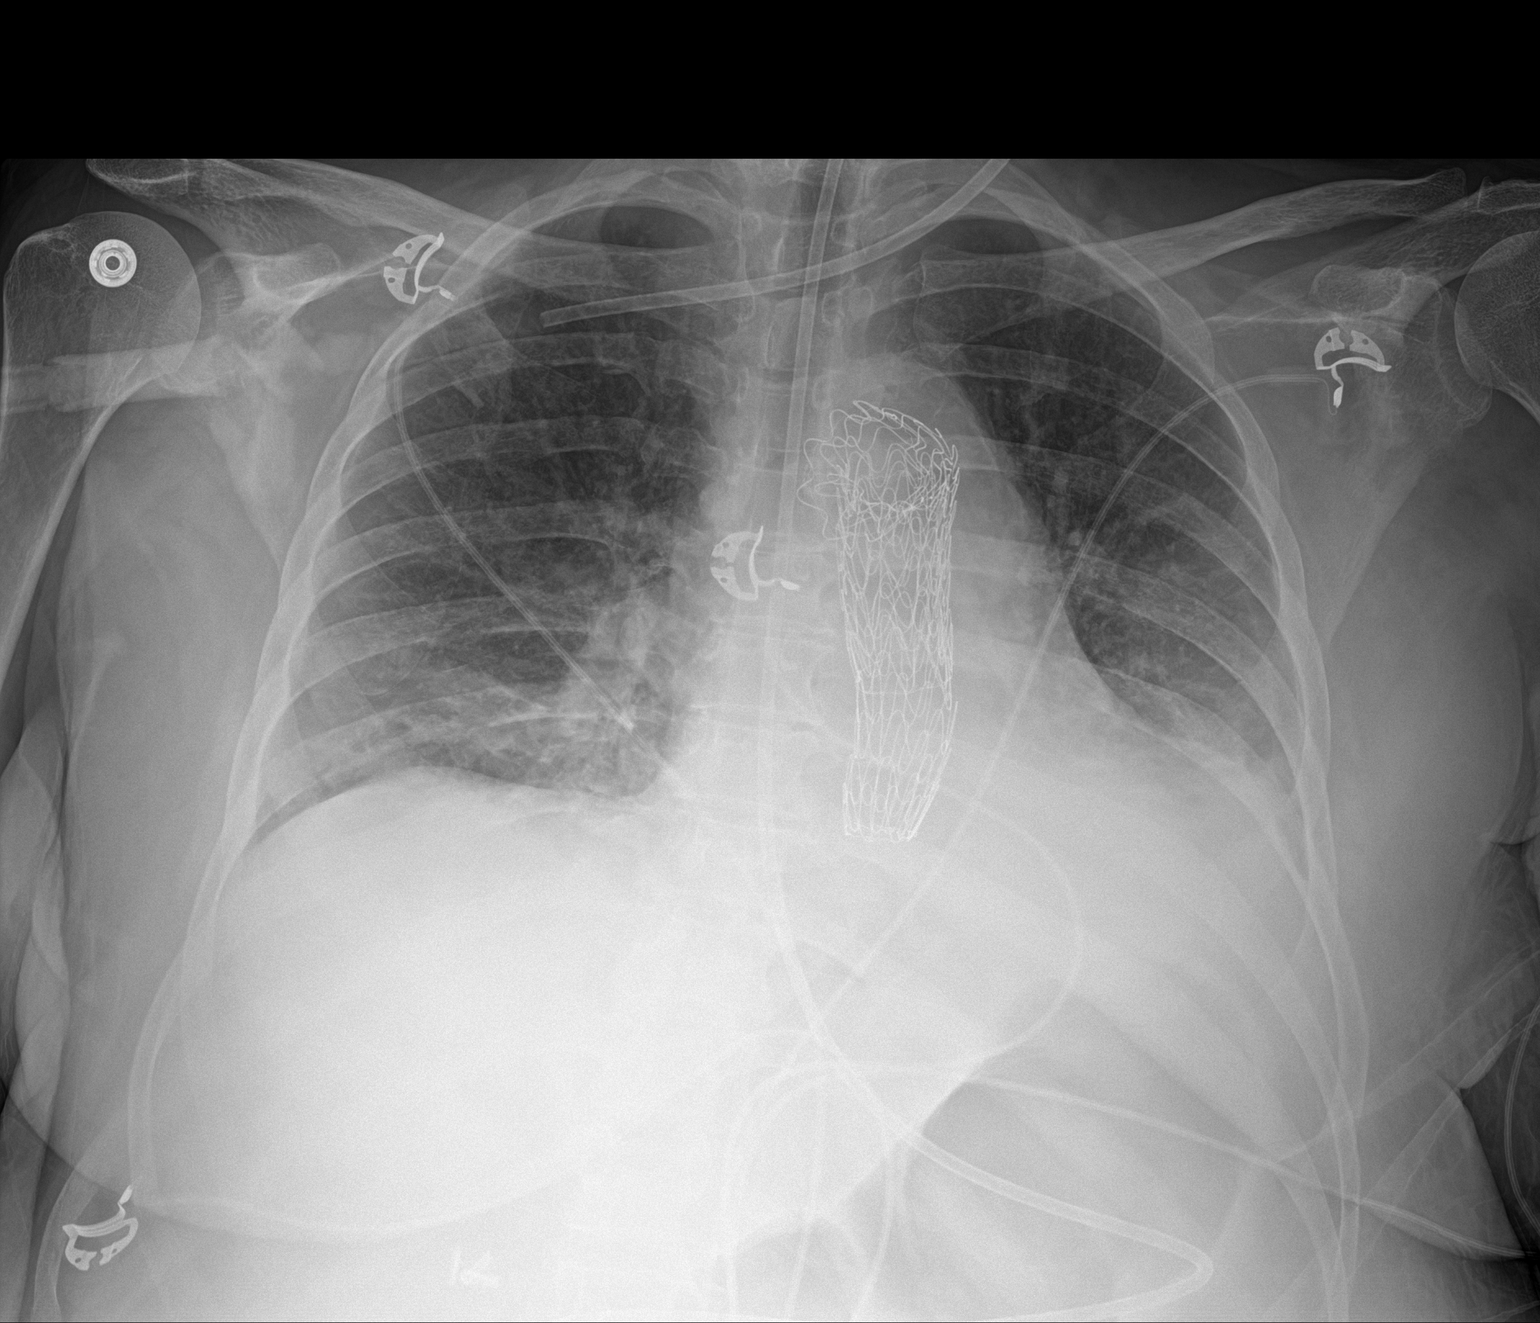

[1 of 1 positions shown; findings below may reference images not displayed]

FINDINGS: Prior aortic stent graft in the descending thoracic aorta. Heart is
normal size. Bibasilar airspace opacities, left greater than right,
worsening on the left since prior study. This could represent
atelectasis although left lower lobe pneumonia cannot be excluded.
Possible small left effusion. No acute bony abnormality.
IMPRESSION: Bibasilar atelectasis or infiltrates, left greater than right.
Possible small left effusion.

## 2018-06-18 IMAGING — DX DG CHEST 1V PORT
1 series · 1 of 1 positions shown · non-contrast
Comparison: Chest x-ray from yesterday.

CLINICAL DATA: Shortness of breath.

EXAM:
PORTABLE CHEST 1 VIEW

[chest]
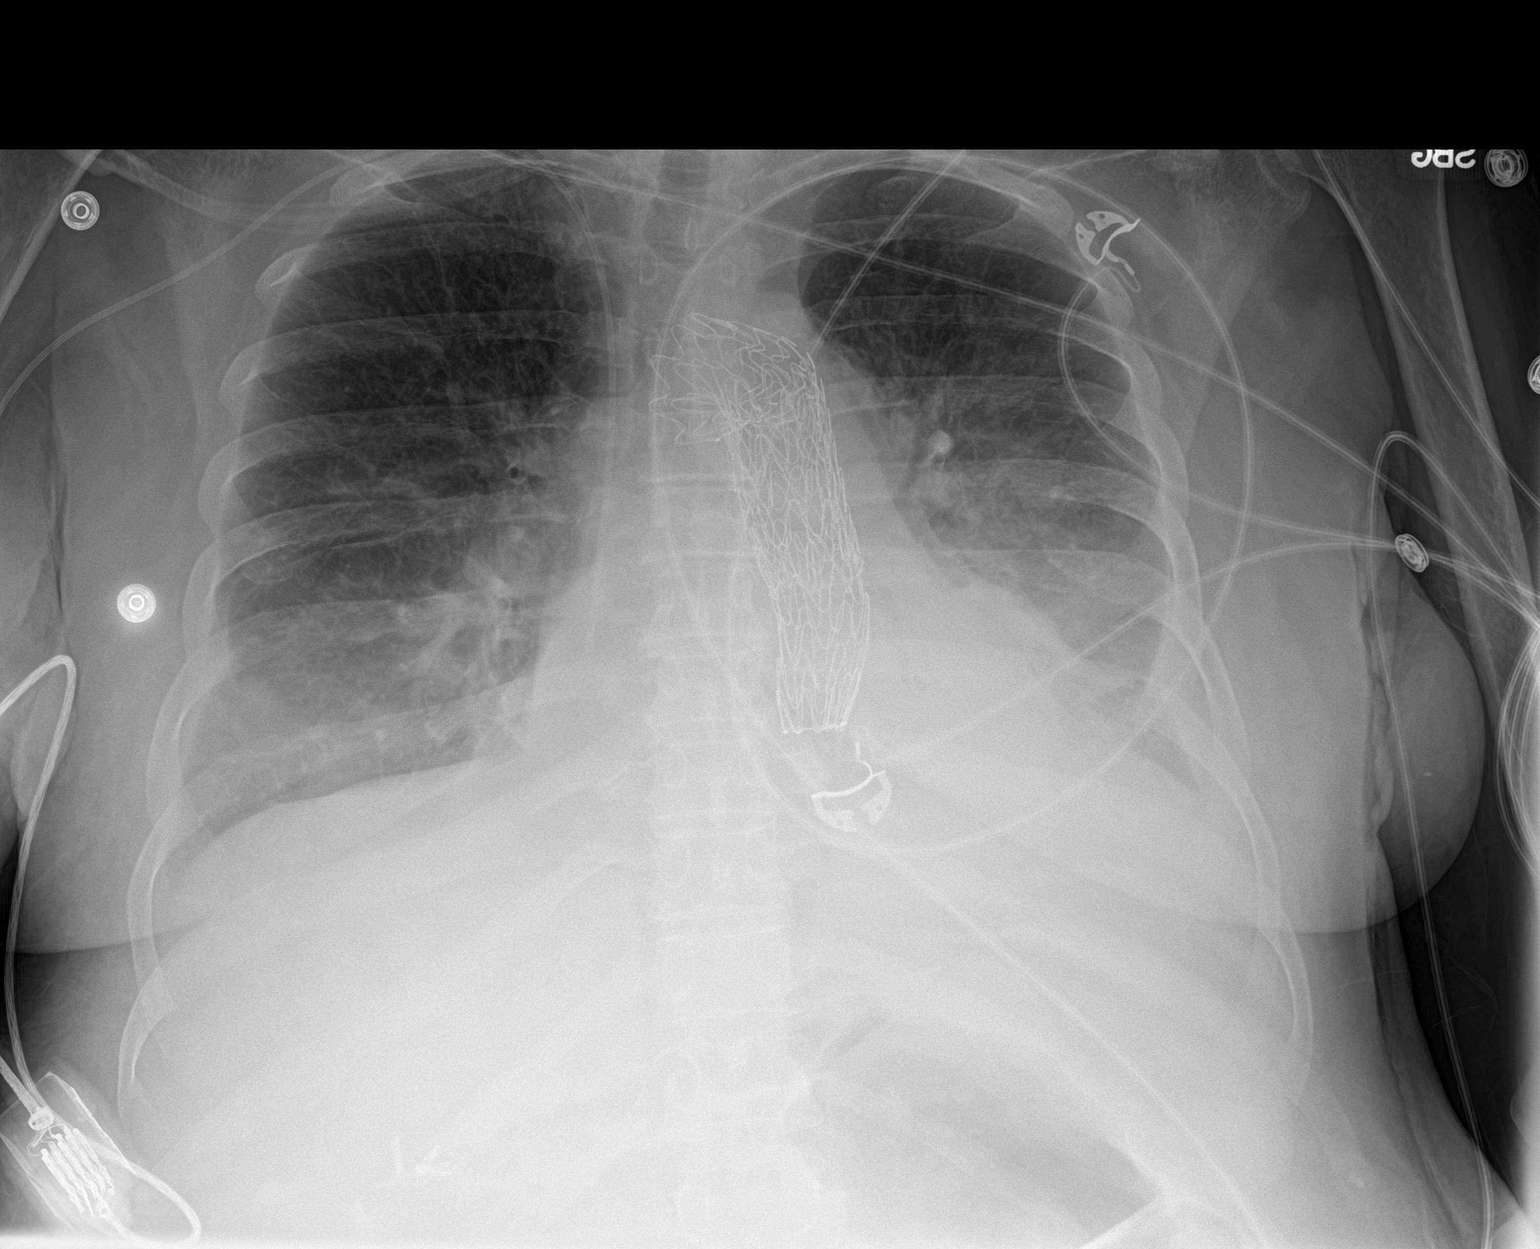

[1 of 1 positions shown; findings below may reference images not displayed]

FINDINGS: Unchanged positioning of the right PICC line with the tip near the
cavoatrial junction. Stable cardiomegaly. Unchanged thoracic aortic
endograft. Mild pulmonary vascular congestion. Unchanged left
basilar atelectasis and adjacent small left pleural effusion. No
pneumothorax.
IMPRESSION: 1. Stable left basilar atelectasis and small left pleural effusion.
2. Mild pulmonary vascular congestion without overt edema, improved.
